# Patient Record
Sex: Male | Born: 1970
Health system: Southern US, Community
[De-identification: ages and names within clinical notes are randomized; demographics above are authoritative.]

## PROBLEM LIST (undated history)

## (undated) DIAGNOSIS — H3500 Unspecified background retinopathy: Secondary | ICD-10-CM

## (undated) DIAGNOSIS — E119 Type 2 diabetes mellitus without complications: Secondary | ICD-10-CM

## (undated) DIAGNOSIS — R809 Proteinuria, unspecified: Secondary | ICD-10-CM

## (undated) DIAGNOSIS — N183 Chronic kidney disease, stage 3 unspecified: Secondary | ICD-10-CM

## (undated) DIAGNOSIS — IMO0001 Reserved for inherently not codable concepts without codable children: Secondary | ICD-10-CM

## (undated) DIAGNOSIS — R3129 Other microscopic hematuria: Secondary | ICD-10-CM

## (undated) DIAGNOSIS — E786 Lipoprotein deficiency: Secondary | ICD-10-CM

## (undated) DIAGNOSIS — F329 Major depressive disorder, single episode, unspecified: Secondary | ICD-10-CM

## (undated) DIAGNOSIS — E039 Hypothyroidism, unspecified: Secondary | ICD-10-CM

## (undated) DIAGNOSIS — M1990 Unspecified osteoarthritis, unspecified site: Secondary | ICD-10-CM

## (undated) DIAGNOSIS — E1143 Type 2 diabetes mellitus with diabetic autonomic (poly)neuropathy: Secondary | ICD-10-CM

## (undated) DIAGNOSIS — R6510 Systemic inflammatory response syndrome (SIRS) of non-infectious origin without acute organ dysfunction: Secondary | ICD-10-CM

## (undated) DIAGNOSIS — J189 Pneumonia, unspecified organism: Secondary | ICD-10-CM

## (undated) DIAGNOSIS — E785 Hyperlipidemia, unspecified: Secondary | ICD-10-CM

## (undated) DIAGNOSIS — K3184 Gastroparesis: Secondary | ICD-10-CM

## (undated) DIAGNOSIS — F32A Depression, unspecified: Secondary | ICD-10-CM

## (undated) DIAGNOSIS — Z794 Long term (current) use of insulin: Secondary | ICD-10-CM

## (undated) DIAGNOSIS — K219 Gastro-esophageal reflux disease without esophagitis: Secondary | ICD-10-CM

## (undated) DIAGNOSIS — D509 Iron deficiency anemia, unspecified: Secondary | ICD-10-CM

## (undated) DIAGNOSIS — E875 Hyperkalemia: Secondary | ICD-10-CM

## (undated) DIAGNOSIS — Z21 Asymptomatic human immunodeficiency virus [HIV] infection status: Secondary | ICD-10-CM

## (undated) HISTORY — DX: Major depressive disorder, single episode, unspecified: F32.9

## (undated) HISTORY — DX: Type 2 diabetes mellitus with diabetic autonomic (poly)neuropathy: E11.43

## (undated) HISTORY — PX: EYE SURGERY: SHX253

## (undated) HISTORY — PX: COLONOSCOPY: SHX174

## (undated) HISTORY — PX: OTHER SURGICAL HISTORY: SHX169

## (undated) HISTORY — DX: Gastro-esophageal reflux disease without esophagitis: K21.9

## (undated) HISTORY — DX: Long term (current) use of insulin: Z79.4

## (undated) HISTORY — DX: Gastroparesis: K31.84

## (undated) HISTORY — DX: Depression, unspecified: F32.A

## (undated) HISTORY — DX: Unspecified background retinopathy: H35.00

## (undated) HISTORY — DX: Unspecified osteoarthritis, unspecified site: M19.90

## (undated) HISTORY — DX: Other microscopic hematuria: R31.29

## (undated) HISTORY — DX: Proteinuria, unspecified: R80.9

## (undated) HISTORY — PX: LASIK: SHX215

## (undated) HISTORY — DX: Hyperlipidemia, unspecified: E78.5

## (undated) HISTORY — DX: Iron deficiency anemia, unspecified: D50.9

## (undated) HISTORY — DX: Type 2 diabetes mellitus without complications: E11.9

## (undated) HISTORY — PX: ESOPHAGOGASTRODUODENOSCOPY: SHX1529

## (undated) HISTORY — PX: VITRECTOMY: SHX106

## (undated) HISTORY — DX: Reserved for inherently not codable concepts without codable children: IMO0001

## (undated) HISTORY — PX: CATARACT EXTRACTION: SUR2

---

## 1998-07-14 ENCOUNTER — Encounter: Admission: RE | Admit: 1998-07-14 | Discharge: 1998-10-12 | Payer: Self-pay | Admitting: Endocrinology

## 1999-11-14 ENCOUNTER — Ambulatory Visit (HOSPITAL_COMMUNITY): Admission: RE | Admit: 1999-11-14 | Discharge: 1999-11-14 | Payer: Self-pay | Admitting: Gastroenterology

## 1999-11-14 ENCOUNTER — Encounter (INDEPENDENT_AMBULATORY_CARE_PROVIDER_SITE_OTHER): Payer: Self-pay | Admitting: Specialist

## 2004-02-15 ENCOUNTER — Emergency Department (HOSPITAL_COMMUNITY): Admission: EM | Admit: 2004-02-15 | Discharge: 2004-02-15 | Payer: Self-pay | Admitting: Emergency Medicine

## 2004-03-12 ENCOUNTER — Encounter: Admission: RE | Admit: 2004-03-12 | Discharge: 2004-03-12 | Payer: Self-pay | Admitting: *Deleted

## 2004-06-25 ENCOUNTER — Ambulatory Visit (HOSPITAL_COMMUNITY): Admission: RE | Admit: 2004-06-25 | Discharge: 2004-06-25 | Payer: Self-pay | Admitting: Ophthalmology

## 2004-07-04 ENCOUNTER — Ambulatory Visit (HOSPITAL_COMMUNITY): Admission: RE | Admit: 2004-07-04 | Discharge: 2004-07-05 | Payer: Self-pay | Admitting: Ophthalmology

## 2005-05-16 ENCOUNTER — Ambulatory Visit: Payer: Self-pay | Admitting: Hematology and Oncology

## 2005-06-11 LAB — URINALYSIS, MICROSCOPIC - CHCC
Bilirubin (Urine): NEGATIVE
Ketones: NEGATIVE mg/dL
Protein: 30 mg/dL
Specific Gravity, Urine: 1.03 (ref 1.003–1.035)
pH: 5 (ref 4.6–8.0)

## 2005-06-11 LAB — CBC & DIFF AND RETIC
BASO%: 0.5 % (ref 0.0–2.0)
Eosinophils Absolute: 0.3 10*3/uL (ref 0.0–0.5)
IRF: 0.38 (ref 0.070–0.380)
LYMPH%: 37.5 % (ref 14.0–48.0)
MCHC: 33.5 g/dL (ref 32.0–35.9)
MONO#: 0.7 10*3/uL (ref 0.1–0.9)
NEUT#: 2.6 10*3/uL (ref 1.5–6.5)
RBC: 4.82 10*6/uL (ref 4.20–5.71)
RDW: 14.1 % (ref 11.2–14.6)
RETIC #: 87.2 10*3/uL (ref 31.8–103.9)
Retic %: 1.8 % (ref 0.7–2.3)
WBC: 5.7 10*3/uL (ref 4.0–10.0)
lymph#: 2.2 10*3/uL (ref 0.9–3.3)

## 2005-06-11 LAB — COMPREHENSIVE METABOLIC PANEL
ALT: 34 U/L (ref 0–40)
Alkaline Phosphatase: 115 U/L (ref 39–117)
CO2: 28 mEq/L (ref 19–32)
Creatinine, Ser: 1 mg/dL (ref 0.4–1.5)
Total Bilirubin: 0.6 mg/dL (ref 0.3–1.2)

## 2005-06-11 LAB — PSA: PSA: 0.36 ng/mL (ref 0.10–4.00)

## 2005-06-11 LAB — CHCC SMEAR

## 2005-06-14 LAB — IRON AND TIBC
%SAT: 17 % — ABNORMAL LOW (ref 20–55)
TIBC: 349 ug/dL (ref 215–435)

## 2005-06-14 LAB — FERRITIN: Ferritin: 19 ng/mL — ABNORMAL LOW (ref 22–322)

## 2005-06-14 LAB — IMMUNOFIXATION ELECTROPHORESIS
IgA: 203 mg/dL (ref 68–378)
Total Protein, Serum Electrophoresis: 8.8 g/dL — ABNORMAL HIGH (ref 6.0–8.3)

## 2005-06-14 LAB — VITAMIN B12: Vitamin B-12: 361 pg/mL (ref 211–911)

## 2005-06-14 LAB — HAPTOGLOBIN: Haptoglobin: 147 mg/dL (ref 16–200)

## 2005-07-12 ENCOUNTER — Ambulatory Visit: Payer: Self-pay | Admitting: Hematology and Oncology

## 2005-07-16 ENCOUNTER — Other Ambulatory Visit: Admission: RE | Admit: 2005-07-16 | Discharge: 2005-07-16 | Payer: Self-pay | Admitting: Hematology and Oncology

## 2005-07-16 ENCOUNTER — Encounter (INDEPENDENT_AMBULATORY_CARE_PROVIDER_SITE_OTHER): Payer: Self-pay | Admitting: Specialist

## 2005-07-16 LAB — CBC WITH DIFFERENTIAL/PLATELET
BASO%: 0.5 % (ref 0.0–2.0)
EOS%: 6.1 % (ref 0.0–7.0)
LYMPH%: 42.7 % (ref 14.0–48.0)
MCHC: 34.3 g/dL (ref 32.0–35.9)
MCV: 80 fL — ABNORMAL LOW (ref 81.6–98.0)
MONO%: 13.5 % — ABNORMAL HIGH (ref 0.0–13.0)
NEUT#: 2.8 10*3/uL (ref 1.5–6.5)
Platelets: 223 10*3/uL (ref 145–400)
RBC: 4.62 10*6/uL (ref 4.20–5.71)
RDW: 13.8 % (ref 11.2–14.6)

## 2007-11-05 DIAGNOSIS — R3129 Other microscopic hematuria: Secondary | ICD-10-CM

## 2007-11-05 HISTORY — DX: Other microscopic hematuria: R31.29

## 2009-07-04 ENCOUNTER — Encounter: Admission: RE | Admit: 2009-07-04 | Discharge: 2009-07-04 | Payer: Self-pay | Admitting: Nephrology

## 2010-02-04 LAB — HM MAMMOGRAPHY

## 2010-06-12 ENCOUNTER — Encounter: Payer: Self-pay | Admitting: Family Medicine

## 2010-06-13 ENCOUNTER — Encounter: Payer: Self-pay | Admitting: Family Medicine

## 2010-06-22 NOTE — Op Note (Signed)
NAMESEVERIN, Patrick Brown                ACCOUNT NO.:  [id number]   MEDICAL RECORD NO.:  [id number]          PATIENT TYPE:  OIB   LOCATION:  5727                         FACILITY:  [place]   PHYSICIAN:  Clent Demark. Rankin, M.D.   DATE OF BIRTH:  Jan 17, 1971   DATE OF PROCEDURE:  10/15/2004  DATE OF DISCHARGE:                                 OPERATIVE REPORT   PREOPERATIVE DIAGNOSES:  1.  Dense vitreous hemorrhage, left eye - recurrent.  2.  Hyphema, left eye.  3.  Secondary glaucoma, left eye, secondary to #2.  4.  Progressive proliferative diabetic retinopathy, left eye, status post      recent vitrectomy for tractional detachment and vitreous hemorrhage.   INDICATIONS FOR PROCEDURE:  The family understands that this is an attempt  to remove the anterior segment hyphema to minimize glaucoma as well as to  clear the vitreous opacificaitons and induce quiescence diabetic  retinopathy.  He understands the risks of anesthesia as well as rare  occurrence of death, loss of the eye, including but not limited to  hemorrhage, infection, scarring, need for further surgery, no change in  vision, loss of vision, progressive disease despite intervention.  Appropriate signed consent was obtained.   DESCRIPTION OF PROCEDURE:  The patient was taken to the operating room.  In  the operating room, appropriate monitors followed by mild sedation.  General  endotracheal anesthesia.  The left ocular region was sterilely prepped and  draped in the usual ophthalmic fashion.  A lid speculum was applied.   A _____ peritomy was then fashioned superiorly and inferotemporally.  A 4-mm  infusion was then secured.  Anterior chamber wash out was then carried out  with BSS irrigation.  The anterior chamber view cleared nicely.  No  recurrence occurred.  The vitreous hemorrhage was thus removed.  There was  neovascular frond noted in the inferotemporal quadrant.  This was cauterized  directly with laser photocoagulation.  No  other retinal holes or tears were  identified.  The anterior chamber and the peripheral vitreous were thus  clear of any recurrence or new hemorrhage.   At this time, the instruments were removed from the eye, and the superior  sclerotomy was closed with 7-0 Vicryl.  The infusion was similarly closed.  The conjunctiva was closed with 7-0 Vicryl.  Subconjunctival injection of  antibiotics were applied.   The patient was awakened from anesthesia without difficulty.  He was taken  to the recovery room.      Clent Demark Rankin, M.D.  Electronically Signed     GAR/MEDQ  D:  10/15/2004  T:  10/16/2004  Job:  [id number]

## 2010-06-22 NOTE — Procedures (Signed)
[place]  Patient:    Patrick Brown, Patrick Brown                       MRN: [id number] Proc. Date: 11/14/99 Adm. Date:  [id number] Attending:  Juanita Craver CC:         Ishmael Holter. Forde Dandy, M.D.   Procedure Report  DATE OF BIRTH:  [birth year].  REFERRING PHYSICIAN:  Ishmael Holter. Forde Dandy, M.D.  PROCEDURE PERFORMED:  Esophagogastroduodenoscopy with biopsies.  ENDOSCOPIST:  Nelwyn Salisbury, M.D.  INSTRUMENT USED:  Olympus video panendoscope.  INDICATIONS FOR PROCEDURE:  A 40 year old white male with a history of epigastric pain and normal abdominal ultrasound.  Rule out peptic ulcer disease, esophagitis, gastritis, etc.  The patient has been on Prevacid which has not helped her symptoms.  PREPROCEDURE PREPARATION:  Informed consent was procured from the patient. The patient was fasted for eight hours prior to the procedure.  PREPROCEDURE PHYSICAL:  The patient had stable vital signs.  Neck supple. Chest clear to auscultation.  S1, S2 regular.  Abdomen soft with epigastric and right upper quadrant tenderness on palpation, no guarding, no rebound, no rigidity, no hepatosplenomegaly.  DESCRIPTION OF PROCEDURE:  The patient was placed in left lateral decubitus position and sedated with 40 mg of Demerol and 4 mg of Versed intravenously. Once the patient was adequately sedated and maintained on low-flow oxygen and continuous cardiac monitoring, the Olympus video panendoscope was advanced through the mouthpiece, over the tongue, into the esophagus under direct vision.  The entire esophagus appeared normal without evidence of ring, stricture, mass, lesions or esophagitis or Barretts mucosa.  The scope was then advanced through the stomach.  A small hiatal hernia was seen on high retroflexion.  There were antral erosions with erythema and friability of the mucosa.  Biopsies were done from the antrum to rule out presence of Helicobacter pylori by pathology.  The  duodenal bulb and the small bowel distal to the bulb up to 60 cm appeared normal.  There was no outlet obstruction.  The patient tolerated the procedure well without complications.  IMPRESSION: 1. Normal-appearing esophagus and small bowel. 2. Small hiatal hernia, antral erosions with bleeding and friability of the    mucosa.  Biopsies done for Helicobacter pylori.  RECOMMENDATION: 1. Continue PPI for now. 2. Treat with antibiotics if Helicobacter pylori present. 3. Avoid all nonsteroidals. 4. Outpatient follow-up in the next two weeks. DD:  11/14/99 TD:  11/15/99 Job: [id number]

## 2010-06-22 NOTE — Op Note (Signed)
NAMEBERVIN, KRONINGER                ACCOUNT NO.:  [id number]   MEDICAL RECORD NO.:  [id number]          PATIENT TYPE:  OIB   LOCATION:  2852                         FACILITY:  [place]   PHYSICIAN:  Clent Demark. Rankin, M.D.   DATE OF BIRTH:  1970/08/17   DATE OF PROCEDURE:  06/25/2004  DATE OF DISCHARGE:  06/25/2004                                 OPERATIVE REPORT   PREOPERATIVE DIAGNOSES:  1.  Dense vitreous hemorrhage of the left eye.  2.  Tractional detachment left eye.  3.  Progressive proliferative diabetic retinopathy left eye.   POSTOPERATIVE DIAGNOSES:  1.  Dense vitreous hemorrhage of the left eye.  2.  Tractional detachment left eye.  3.  Progressive proliferative diabetic retinopathy left eye.   PROCEDURES:  1.  Posterior vitrectomy with membrane peel left eye.  2.  Endolaser panretinal photocoagulation left eye.   SURGEON:  Clent Demark. Rankin, M.D.   ANESTHESIA:  General endotracheal anesthesia.   INDICATION FOR PROCEDURE:  This is a 40 year old man who has profound vision  loss, nonclearing vitreous hemorrhage left eye that has affected his  activities of daily living as well as ability to work.  The patient  understands this is an attempt to clear the vitreous opacification as well  as to allow best visual function, as well as to reduce quiescence of  diabetic retinopathy.  He understands the risks of anesthesia including the  rare occurrence of death, loss of the eye including but not limited to  further surgery on the underlying disease, hemorrhage, infection, scarring,  need for another surgery, no change in vision, loss of vision, and  progressive disease despite intervention.  Appropriate signed consent was  obtained, and he was taken to the operating room.   In the operating room, general endotracheal anesthesia was instituted as per  his request.  The left periocular region was sterilely prepped and draped in  the usual sterile fashion.  Lid speculum applied.  A  25-gauge trocar system  was used to complete the procedure.  The inferotemporal trocar was placed  into the vitreous cavity.  The infusion was secured and visualized in the  vitreous cavity.  Also, with microscopic guidance, superior trocars were  placed.  Core vitrectomy was then begun.  Dense preretinal vitreous  hemorrhage was removed as well as removing the posterior hyaloid.  Tractional detachment was found along this superotemporal arcade which was  also the site of hemorrhage.  The attachments to the retina were transected  and endolaser photocoagulation placed to cauterize the bleeding blood  vessel.  Preretinal hemorrhage was aspirated.  Remaining areas of  vitreoretinal detachments were noted also at the 11 o'clock position in the  midperiphery.  These areas of traction were also released.  The peripheral  vitreous skirt was then trimmed 360 degrees.  Preretinal hemorrhage was  removed entirely.  Endolaser photocoagulation was placed 360 degrees using  directional laser probe.  At this time, the instruments were removed from  the eye.  Hemostasis was spontaneous.  Under low infusion pressure, the trocar system  was removed.  Subconjunctival  injection of steroid placed inferonasally.  Sterile patch and Fox shield applied.  Intraocular pressure was found to be  adequate.  The patient was awakened from anesthesia and taken to the  recovery room in good and stable condition.       GAR/MEDQ  D:  06/25/2004  T:  06/26/2004  Job:  [id number]

## 2010-12-31 ENCOUNTER — Encounter (HOSPITAL_COMMUNITY): Payer: Self-pay

## 2010-12-31 ENCOUNTER — Other Ambulatory Visit: Payer: Self-pay

## 2010-12-31 ENCOUNTER — Inpatient Hospital Stay (HOSPITAL_COMMUNITY)
Admission: EM | Admit: 2010-12-31 | Discharge: 2011-01-02 | DRG: 296 | Disposition: A | Discharge status: Home / Self Care | Payer: BC Managed Care – PPO | Attending: Internal Medicine | Admitting: Internal Medicine

## 2010-12-31 DIAGNOSIS — Z9641 Presence of insulin pump (external) (internal): Secondary | ICD-10-CM

## 2010-12-31 DIAGNOSIS — D649 Anemia, unspecified: Secondary | ICD-10-CM | POA: Diagnosis present

## 2010-12-31 DIAGNOSIS — N183 Chronic kidney disease, stage 3 unspecified: Secondary | ICD-10-CM | POA: Diagnosis present

## 2010-12-31 DIAGNOSIS — E875 Hyperkalemia: Principal | ICD-10-CM | POA: Diagnosis present

## 2010-12-31 DIAGNOSIS — E1029 Type 1 diabetes mellitus with other diabetic kidney complication: Secondary | ICD-10-CM | POA: Diagnosis present

## 2010-12-31 DIAGNOSIS — E785 Hyperlipidemia, unspecified: Secondary | ICD-10-CM | POA: Diagnosis present

## 2010-12-31 DIAGNOSIS — E871 Hypo-osmolality and hyponatremia: Secondary | ICD-10-CM | POA: Diagnosis present

## 2010-12-31 DIAGNOSIS — Z794 Long term (current) use of insulin: Secondary | ICD-10-CM

## 2010-12-31 DIAGNOSIS — R739 Hyperglycemia, unspecified: Secondary | ICD-10-CM

## 2010-12-31 DIAGNOSIS — E039 Hypothyroidism, unspecified: Secondary | ICD-10-CM | POA: Diagnosis present

## 2010-12-31 HISTORY — DX: Hypothyroidism, unspecified: E03.9

## 2010-12-31 LAB — BASIC METABOLIC PANEL
CO2: 18 mEq/L — ABNORMAL LOW (ref 19–32)
Calcium: 8.9 mg/dL (ref 8.4–10.5)
Chloride: 104 mEq/L (ref 96–112)
Glucose, Bld: 398 mg/dL — ABNORMAL HIGH (ref 70–99)
Potassium: 6.1 mEq/L — ABNORMAL HIGH (ref 3.5–5.1)
Sodium: 129 mEq/L — ABNORMAL LOW (ref 135–145)

## 2010-12-31 LAB — BLOOD GAS, ARTERIAL
Bicarbonate: 18.4 mEq/L — ABNORMAL LOW (ref 20.0–24.0)
Drawn by: 232811
FIO2: 0.21 %
O2 Saturation: 97.3 %
Patient temperature: 98.6
pH, Arterial: 7.354 (ref 7.350–7.450)
pO2, Arterial: 92.6 mmHg (ref 80.0–100.0)

## 2010-12-31 LAB — CARDIAC PANEL(CRET KIN+CKTOT+MB+TROPI)
CK, MB: 16.5 ng/mL (ref 0.3–4.0)
Relative Index: 7.7 — ABNORMAL HIGH (ref 0.0–2.5)
Troponin I: <0.3 ng/mL (ref <0.30)

## 2010-12-31 LAB — URINALYSIS, ROUTINE W REFLEX MICROSCOPIC
Bilirubin Urine: NEGATIVE
Ketones, ur: NEGATIVE mg/dL
Nitrite: NEGATIVE
Protein, ur: NEGATIVE mg/dL
Specific Gravity, Urine: 1.008 (ref 1.005–1.030)
Urobilinogen, UA: 0.2 mg/dL (ref 0.0–1.0)

## 2010-12-31 LAB — GLUCOSE, CAPILLARY

## 2010-12-31 LAB — CBC
Hemoglobin: 10.2 g/dL — ABNORMAL LOW (ref 13.0–17.0)
MCH: 28.3 pg (ref 26.0–34.0)
RBC: 3.61 MIL/uL — ABNORMAL LOW (ref 4.22–5.81)
WBC: 4.6 10*3/uL (ref 4.0–10.5)

## 2010-12-31 LAB — URINE MICROSCOPIC-ADD ON

## 2010-12-31 MED ORDER — SODIUM CHLORIDE 0.9 % IV SOLN: INTRAVENOUS | Status: DC

## 2010-12-31 MED ORDER — SODIUM POLYSTYRENE SULFONATE 15 GM/60ML PO SUSP
30.0000 g | Freq: Once | ORAL | Status: AC
  Administered 2011-01-01: 30 g via ORAL
  Filled 2010-12-31: qty 120

## 2010-12-31 MED ORDER — SODIUM POLYSTYRENE SULFONATE 15 GM/60ML PO SUSP
30.0000 g | Freq: Once | ORAL | Status: AC
  Administered 2010-12-31: 30 g via ORAL
  Filled 2010-12-31: qty 120

## 2010-12-31 MED ORDER — INSULIN GLARGINE 100 UNIT/ML ~~LOC~~ SOLN
10.0000 [IU] | Freq: Every day | SUBCUTANEOUS | Status: DC
  Filled 2010-12-31: qty 3

## 2010-12-31 MED ORDER — SODIUM CHLORIDE 0.9 % IJ SOLN: 3.0000 mL | INTRAMUSCULAR | Status: DC | PRN

## 2010-12-31 MED ORDER — ONDANSETRON HCL 4 MG/2ML IJ SOLN: 4.0000 mg | Freq: Four times a day (QID) | INTRAMUSCULAR | Status: DC | PRN

## 2010-12-31 MED ORDER — ENOXAPARIN SODIUM 40 MG/0.4ML ~~LOC~~ SOLN
40.0000 mg | Freq: Every day | SUBCUTANEOUS | Status: DC
  Administered 2011-01-01: 40 mg via SUBCUTANEOUS
  Filled 2010-12-31 (×2): qty 0.4

## 2010-12-31 MED ORDER — LEVOTHYROXINE SODIUM 200 MCG PO TABS
200.0000 ug | ORAL_TABLET | Freq: Every day | ORAL | Status: DC
  Administered 2011-01-01 – 2011-01-02 (×2): 200 ug via ORAL
  Filled 2010-12-31 (×2): qty 1

## 2010-12-31 MED ORDER — OMEGA-3-ACID ETHYL ESTERS 1 G PO CAPS
2.0000 g | ORAL_CAPSULE | Freq: Two times a day (BID) | ORAL | Status: DC
  Administered 2011-01-01 – 2011-01-02 (×4): 2 g via ORAL
  Filled 2010-12-31 (×5): qty 2

## 2010-12-31 MED ORDER — INSULIN ASPART 100 UNIT/ML ~~LOC~~ SOLN: 0.0000 [IU] | Freq: Every day | SUBCUTANEOUS | Status: DC

## 2010-12-31 MED ORDER — FUROSEMIDE 10 MG/ML IJ SOLN
80.0000 mg | Freq: Once | INTRAMUSCULAR | Status: AC
  Administered 2010-12-31: 80 mg via INTRAVENOUS
  Filled 2010-12-31: qty 8

## 2010-12-31 MED ORDER — NIACIN ER (ANTIHYPERLIPIDEMIC) 750 MG PO TBCR: 750.0000 mg | EXTENDED_RELEASE_TABLET | Freq: Every day | ORAL | Status: DC

## 2010-12-31 MED ORDER — INSULIN ASPART 100 UNIT/ML ~~LOC~~ SOLN
10.0000 [IU] | Freq: Once | SUBCUTANEOUS | Status: AC
  Administered 2010-12-31: 18:00:00 via SUBCUTANEOUS
  Filled 2010-12-31: qty 1

## 2010-12-31 MED ORDER — TESTOSTERONE 50 MG/5GM (1%) TD GEL
5.0000 g | Freq: Every day | TRANSDERMAL | Status: DC
  Administered 2011-01-01: 5 g via TRANSDERMAL
  Filled 2010-12-31 (×3): qty 5

## 2010-12-31 MED ORDER — SODIUM CHLORIDE 0.9 % IV BOLUS (SEPSIS)
250.0000 mL | Freq: Once | INTRAVENOUS | Status: AC
  Administered 2010-12-31: 250 mL via INTRAVENOUS

## 2010-12-31 MED ORDER — ALBUTEROL SULFATE (5 MG/ML) 0.5% IN NEBU: 2.5000 mg | INHALATION_SOLUTION | RESPIRATORY_TRACT | Status: DC | PRN

## 2010-12-31 MED ORDER — ACETAMINOPHEN 325 MG PO TABS: 650.0000 mg | ORAL_TABLET | Freq: Four times a day (QID) | ORAL | Status: DC | PRN

## 2010-12-31 MED ORDER — INSULIN ASPART 100 UNIT/ML ~~LOC~~ SOLN: 3.0000 [IU] | Freq: Three times a day (TID) | SUBCUTANEOUS | Status: DC

## 2010-12-31 MED ORDER — PANTOPRAZOLE SODIUM 40 MG PO TBEC
40.0000 mg | DELAYED_RELEASE_TABLET | Freq: Every day | ORAL | Status: DC
  Administered 2011-01-01: 40 mg via ORAL
  Filled 2010-12-31 (×3): qty 1

## 2010-12-31 MED ORDER — INSULIN ASPART 100 UNIT/ML ~~LOC~~ SOLN
0.0000 [IU] | Freq: Three times a day (TID) | SUBCUTANEOUS | Status: DC
  Filled 2010-12-31: qty 3

## 2010-12-31 MED ORDER — ZOLPIDEM TARTRATE 10 MG PO TABS: 10.0000 mg | ORAL_TABLET | Freq: Every evening | ORAL | Status: DC | PRN

## 2010-12-31 MED ORDER — ASPIRIN 81 MG PO CHEW
81.0000 mg | CHEWABLE_TABLET | Freq: Every day | ORAL | Status: DC
  Administered 2011-01-01 – 2011-01-02 (×2): 81 mg via ORAL
  Filled 2010-12-31 (×2): qty 1

## 2010-12-31 MED ORDER — ACETAMINOPHEN 650 MG RE SUPP: 650.0000 mg | Freq: Four times a day (QID) | RECTAL | Status: DC | PRN

## 2010-12-31 MED ORDER — ONDANSETRON HCL 4 MG PO TABS: 4.0000 mg | ORAL_TABLET | Freq: Four times a day (QID) | ORAL | Status: DC | PRN

## 2010-12-31 MED ORDER — SODIUM CHLORIDE 0.9 % IV SOLN: 250.0000 mL | INTRAVENOUS | Status: DC | PRN

## 2010-12-31 MED ORDER — SIMVASTATIN 40 MG PO TABS
40.0000 mg | ORAL_TABLET | Freq: Every day | ORAL | Status: DC
  Administered 2011-01-01: 40 mg via ORAL
  Filled 2010-12-31 (×2): qty 1

## 2010-12-31 MED ORDER — SODIUM CHLORIDE 0.9 % IJ SOLN
3.0000 mL | Freq: Two times a day (BID) | INTRAMUSCULAR | Status: DC
  Administered 2011-01-01 (×2): 3 mL via INTRAVENOUS

## 2010-12-31 MED ORDER — INSULIN REGULAR HUMAN 100 UNIT/ML IJ SOLN: 10.0000 [IU] | Freq: Once | INTRAMUSCULAR | Status: DC

## 2010-12-31 MED ORDER — FERROUS SULFATE 325 (65 FE) MG PO TABS
325.0000 mg | ORAL_TABLET | Freq: Two times a day (BID) | ORAL | Status: DC
  Administered 2011-01-01 – 2011-01-02 (×4): 325 mg via ORAL
  Filled 2010-12-31 (×5): qty 1

## 2010-12-31 MED ORDER — ENOXAPARIN SODIUM 30 MG/0.3ML ~~LOC~~ SOLN: 30.0000 mg | SUBCUTANEOUS | Status: DC

## 2010-12-31 MED ORDER — NIACIN ER 500 MG PO CPCR
750.0000 mg | ORAL_CAPSULE | Freq: Every day | ORAL | Status: DC
  Administered 2011-01-01: 750 mg via ORAL
  Filled 2010-12-31 (×2): qty 1

## 2010-12-31 MED ORDER — SODIUM CHLORIDE 0.9 % IV SOLN
INTRAVENOUS | Status: DC
  Administered 2010-12-31: 16:00:00 via INTRAVENOUS

## 2010-12-31 MED ORDER — VENLAFAXINE HCL 50 MG PO TABS
50.0000 mg | ORAL_TABLET | Freq: Two times a day (BID) | ORAL | Status: DC
  Administered 2011-01-01 – 2011-01-02 (×4): 50 mg via ORAL
  Filled 2010-12-31 (×5): qty 1

## 2010-12-31 NOTE — ED Notes (Signed)
Patient ambulated to bathroom; gait steady. No complaints voiced at this time. Awaiting bed assignment.

## 2010-12-31 NOTE — ED Notes (Signed)
Hypokalemia,....weakness ,

## 2010-12-31 NOTE — H&P (Addendum)
Patrick Brown MRN: [id number] DOB/AGE: [birth year] 40 y.o. Primary Care Physician:No primary provider on file. Admit date: 12/31/2010  PCP Dr. Morrie Sheldon in [facility]  Chief Complaint: hyperkalemia  HPI:  Is a 40 year old male who comes to the ER, therefore to by his PCP for a potassium of 6.5. He was administered Lasix and kayexalate  in the ER The patient states he has not felt well since July of this year, when he was admitted and [facility] hospital, for weight loss diarrhea. His potassium was high, even in July. It was as high as 7.5. At that time he was on Micardis. The patient has experienced some shortness of breath, denies any fever chills rigers or cough. Denies any orthopnea paroxysmal nocturnal dyspnea or dependent edema. He denies any nausea vomiting abdominal pain. He states that he has had a low hemoglobin, for several years and was evaluated by Dr. Kennith Gain, and was told that his anemia is because of his chronic kidney disease.   Past Medical History  Diagnosis Date  . IDDM (insulin dependent diabetes mellitus)   . Retinopathy     x2  . Proteinuria   . Dyslipidemia   . Anemia, iron deficiency   . Hematuria, microscopic 10/09    work up negative (Dr. Amalia Hailey)  . Diabetes mellitus   . Hypothyroidism     Past Surgical History  Procedure Date  . Eye surgery 2006    x2     Prior to Admission medications   Medication Sig Start Date End Date Taking? Authorizing Provider  aspirin (ASPIRIN CHILDRENS) 81 MG chewable tablet Chew 81 mg by mouth daily.    Yes Historical Provider, MD  dexlansoprazole (DEXILANT) 60 MG capsule Take 60 mg by mouth daily.     Yes Historical Provider, MD  ferrous sulfate (KP FERROUS SULFATE) 325 (65 FE) MG tablet Take 325 mg by mouth 2 (two) times daily.     Yes Historical Provider, MD  insulin lispro (HUMALOG) 100 UNIT/ML injection by Per Tube route continuous. Pt.  Uses insulin via insulin pump .    Yes Historical Provider, MD  levothyroxine  (SYNTHROID, LEVOTHROID) 200 MCG tablet Take 200 mcg by mouth.     Yes Historical Provider, MD  meloxicam (MOBIC) 15 MG tablet Take 15 mg by mouth as needed.     Yes Historical Provider, MD  niacin (NIASPAN) 750 MG CR tablet Take 750 mg by mouth at bedtime.     Yes Historical Provider, MD  omega-3 acid ethyl esters (LOVAZA) 1 G capsule Take 2 g by mouth 2 (two) times daily.     Yes Historical Provider, MD  simvastatin (ZOCOR) 40 MG tablet Take 40 mg by mouth at bedtime.     Yes Historical Provider, MD  testosterone (ANDROGEL) 50 MG/5GM GEL Place 5 g onto the skin daily.     Yes Historical Provider, MD  venlafaxine (EFFEXOR) 50 MG tablet Take 50 mg by mouth 2 (two) times daily.     Yes Historical Provider, MD  Blood Glucose Monitoring Suppl (B-D LOGIC BLOOD GLUCOSE) W/DEVICE KIT by Does not apply route.      Historical Provider, MD  glucose blood test strip 1 each by Other route as needed. Use as instructed     Historical Provider, MD  Insulin Admin Supplies (INSULIN PUMP RECORD BOOK) MISC by Does not apply route.      Historical Provider, MD  Insulin Infusion Pump DEVI by Does not apply route.  Historical Provider, MD  Insulin Infusion Pump KIT by Does not apply route.      Historical Provider, MD  Insulin Infusion Pump Supplies (INSULIN PUMP SYRINGE RESERVOIR) MISC by Does not apply route.      Historical Provider, MD  Insulin Infusion Pump Supplies MISC by Does not apply route.      Historical Provider, MD  Lancets MISC by Does not apply route.      Historical Provider, MD  zolpidem (AMBIEN) 10 MG tablet Take 10 mg by mouth at bedtime as needed. For sleep    Historical Provider, MD    Allergies:  Allergies  Allergen Reactions  . Altace Cough    Family History  Problem Relation Age of Onset  . Prostate cancer      family history     Social History:  reports that he has never smoked. He has never used smokeless tobacco. He reports that he drinks alcohol. He reports that he does not  use illicit drugs.         [id number] 14 point review of systems was done, as documented in history of present illness  PHYSICAL EXAM: Blood pressure 119/71, pulse 93, temperature 98.3 F (36.8 C), temperature source Oral, resp. rate 16, SpO2 99.00%.  No results found for this or any previous visit (from the past 240 hour(s)).  Nursing note and vitals reviewed.  Constitutional: He is oriented to person, place, and time. He appears well-developed and well-nourished.  HENT:  Head: Normocephalic and atraumatic.  Mouth/Throat: Oropharynx is clear and moist.  Eyes: Conjunctivae and EOM are normal. Pupils are equal, round, and reactive to light.  Neck: Normal range of motion. Neck supple.  Cardiovascular: Normal rate, regular rhythm, normal heart sounds and intact distal pulses.  No murmur heard.  Pulmonary/Chest: Effort normal. He exhibits no tenderness.  Abdominal: Soft. Bowel sounds are normal. There is no tenderness.  Musculoskeletal: Normal range of motion. He exhibits no edema.  Lymphadenopathy:  He has no cervical adenopathy.  Neurological: He is alert and oriented to person, place, and time. No cranial nerve deficit. He exhibits normal muscle tone. Coordination normal.  Skin: Skin is warm and dry. No rash noted. No erythema.   Results for orders placed during the hospital encounter of 12/31/10 (from the past 48 hour(s))  CBC     Status: Abnormal   Collection Time   12/31/10  3:56 PM      Component Value Range Comment   WBC 4.6  4.0 - 10.5 (K/uL)    RBC 3.61 (*) 4.22 - 5.81 (MIL/uL)    Hemoglobin 10.2 (*) 13.0 - 17.0 (g/dL)    HCT 30.6 (*) 39.0 - 52.0 (%)    MCV 84.8  78.0 - 100.0 (fL)    MCH 28.3  26.0 - 34.0 (pg)    MCHC 33.3  30.0 - 36.0 (g/dL)    RDW 14.1  11.5 - 15.5 (%)    Platelets 120 (*) 150 - 400 (K/uL)   BASIC METABOLIC PANEL     Status: Abnormal   Collection Time   12/31/10  3:56 PM      Component Value Range Comment   Sodium 129 (*) 135 - 145 (mEq/L)     Potassium 6.1 (*) 3.5 - 5.1 (mEq/L)    Chloride 104  96 - 112 (mEq/L)    CO2 18 (*) 19 - 32 (mEq/L)    Glucose, Bld 398 (*) 70 - 99 (mg/dL)    BUN 35 (*) 6 -  23 (mg/dL)    Creatinine, Ser 1.69 (*) 0.50 - 1.35 (mg/dL)    Calcium 8.9  8.4 - 10.5 (mg/dL)    GFR calc non Af Amer 49 (*) >90 (mL/min)    GFR calc Af Amer 56 (*) >90 (mL/min)   GLUCOSE, CAPILLARY     Status: Abnormal   Collection Time   12/31/10  6:15 PM      Component Value Range Comment   Glucose-Capillary 282 (*) 70 - 99 (mg/dL)    Comment 1 Documented in Chart      Comment 2 Notify RN     BLOOD GAS, ARTERIAL     Status: Abnormal   Collection Time   12/31/10  7:45 PM      Component Value Range Comment   FIO2 0.21      Delivery systems ROOM AIR      pH, Arterial 7.354  7.350 - 7.450     pCO2 arterial 33.9 (*) 35.0 - 45.0 (mmHg)    pO2, Arterial 92.6  80.0 - 100.0 (mmHg)    Bicarbonate 18.4 (*) 20.0 - 24.0 (mEq/L)    TCO2 17.2  0 - 100 (mmol/L)    Acid-base deficit 5.9 (*) 0.0 - 2.0 (mmol/L)    O2 Saturation 97.3      Patient temperature 98.6      Collection site RIGHT RADIAL      Drawn by [id number]      Sample type ARTERIAL      Allens test (pass/fail) PASS  PASS      No results found.  Impression:   #1 hyperkalemia and was given IV Lasix and kayexalate in the ER, we will monitor serial BMP, administer another dose of kayexalate , avoid ACE inhibitors ARB's and NSAIDs.  #2 Anemia he states that his baseline hemoglobin is around 10, he denies any evidence of history of GI bleeding.   #3 Chronic kidney disease, stage 3, mod decreased GFR, he states that his baseline creatinine is about 1.5.  #4 Diabetes mellitus we will turn off his insulin pump and use Lantus and sliding scale insulin.  #5 hyponatremia likely pseudohyponatremia secondary to elevated CBGs, and expected to improve tomorrow.   #6 shortness of breath apparently had a normal chest x-ray, will check a stat d-dimer is elevated we'll do a vq scan to  rule out a PE     [facility] 12/31/2010, 9:17 PM

## 2010-12-31 NOTE — ED Notes (Signed)
Allyson Sabal, MD at bedside.

## 2010-12-31 NOTE — ED Notes (Signed)
Pt reports getting results from his PCP today with elevated K level and low hgb levels.  Reports becoming short of breath while in the shower this morning

## 2010-12-31 NOTE — ED Notes (Signed)
Attempt to call report to 4E unsuccessful; receiving RN will return call to ED when available for report.

## 2010-12-31 NOTE — ED Notes (Signed)
Attempt to call report unsuccessful.

## 2010-12-31 NOTE — ED Notes (Signed)
K+ 7.5 in July.

## 2010-12-31 NOTE — ED Provider Notes (Signed)
History     CSN: [id number] Arrival date & time: 12/31/2010  1:38 PM   First MD Initiated Contact with Patient 12/31/10 1510      Chief Complaint  Patient presents with  . Abnormal Lab    (Consider location/radiation/quality/duration/timing/severity/associated sxs/prior treatment) The history is provided by the patient.   patient is a 40 year old male sent from his primary care doctor's office for elevated potassium of 6.5. Patient has a history of borderline renal function has had elevated potassium in the past requiring admission treated with IV Lasix and Kayexalate. Patient also has a history of diabetes. No other complaints other than mild shortness of breath today and the patient had a chest x-ray and is doctor's office was normal. No further complaint of shortness of breath. Patient denies headache fever chills cough chest pain abdominal pain nausea vomiting diarrhea leg swelling rash or dysuria. Patient is currently a symptomatic.  Past Medical History  Diagnosis Date  . IDDM (insulin dependent diabetes mellitus)   . Retinopathy     x2  . Proteinuria   . Dyslipidemia   . Anemia, iron deficiency   . Hematuria, microscopic 10/09    work up negative (Dr. Amalia Hailey)  . Diabetes mellitus     Past Surgical History  Procedure Date  . Eye surgery 2006    x2     Family History  Problem Relation Age of Onset  . Prostate cancer      family history     History  Substance Use Topics  . Smoking status: Not on file  . Smokeless tobacco: Not on file  . Alcohol Use:       Review of Systems  Constitutional: Negative for fever and chills.  HENT: Negative for congestion and neck pain.   Eyes: Negative for redness and visual disturbance.  Respiratory: Positive for shortness of breath. Negative for cough and wheezing.   Cardiovascular: Negative for chest pain, palpitations and leg swelling.  Gastrointestinal: Negative for nausea, vomiting, abdominal pain and diarrhea.    Genitourinary: Negative for dysuria and hematuria.  Musculoskeletal: Negative for back pain.  Neurological: Negative for weakness and headaches.  Hematological: Negative for adenopathy.  Psychiatric/Behavioral: Negative for confusion.    Allergies  Altace  Home Medications   Current Outpatient Rx  Name Route Sig Dispense Refill  . ASPIRIN 81 MG PO CHEW Oral Chew 81 mg by mouth daily.     . DEXLANSOPRAZOLE 60 MG PO CPDR Oral Take 60 mg by mouth daily.      Marland Kitchen FERROUS SULFATE 325 (65 FE) MG PO TABS Oral Take 325 mg by mouth 2 (two) times daily.      . INSULIN LISPRO (HUMAN) 100 UNIT/ML [place] SOLN Per Tube by Per Tube route continuous. Pt.  Uses insulin via insulin pump .     Marland Kitchen LEVOTHYROXINE SODIUM 200 MCG PO TABS Oral Take 200 mcg by mouth.      . MELOXICAM 15 MG PO TABS Oral Take 15 mg by mouth as needed.      Marland Kitchen NIACIN (ANTIHYPERLIPIDEMIC) 750 MG PO TBCR Oral Take 750 mg by mouth at bedtime.      . OMEGA-3-ACID ETHYL ESTERS 1 G PO CAPS Oral Take 2 g by mouth 2 (two) times daily.      Marland Kitchen SIMVASTATIN 40 MG PO TABS Oral Take 40 mg by mouth at bedtime.      . TESTOSTERONE 50 MG/5GM TD GEL Transdermal Place 5 g onto the skin daily.      Marland Kitchen  VENLAFAXINE HCL 50 MG PO TABS Oral Take 50 mg by mouth 2 (two) times daily.      . BD LOGIC BLOOD GLUCOSE MONITOR W/DEVICE KIT Does not apply by Does not apply route.      Marland Kitchen GLUCOSE BLOOD VI STRP Other 1 each by Other route as needed. Use as instructed     . INSULIN PUMP RECORD BOOK MISC Does not apply by Does not apply route.      . INSULIN INFUSION PUMP DEVI Does not apply by Does not apply route.      . INSULIN INFUSION PUMP KIT Does not apply by Does not apply route.      . INSULIN PUMP SYRINGE RESERVOIR MISC Does not apply by Does not apply route.      . INSULIN INFUSION PUMP SUPPLIES MISC Does not apply by Does not apply route.      Marland Kitchen LANCETS MISC Does not apply by Does not apply route.      Marland Kitchen ZOLPIDEM TARTRATE 10 MG PO TABS Oral Take 10 mg by mouth at  bedtime as needed. For sleep      BP 128/75  Pulse 112  Temp(Src) 98.1 F (36.7 C) (Oral)  Resp 16  SpO2 99%  Physical Exam  Nursing note and vitals reviewed. Constitutional: He is oriented to person, place, and time. He appears well-developed and well-nourished.  HENT:  Head: Normocephalic and atraumatic.  Mouth/Throat: Oropharynx is clear and moist.  Eyes: Conjunctivae and EOM are normal. Pupils are equal, round, and reactive to light.  Neck: Normal range of motion. Neck supple.  Cardiovascular: Normal rate, regular rhythm, normal heart sounds and intact distal pulses.   No murmur heard. Pulmonary/Chest: Effort normal. He exhibits no tenderness.  Abdominal: Soft. Bowel sounds are normal. There is no tenderness.  Musculoskeletal: Normal range of motion. He exhibits no edema.  Lymphadenopathy:    He has no cervical adenopathy.  Neurological: He is alert and oriented to person, place, and time. No cranial nerve deficit. He exhibits normal muscle tone. Coordination normal.  Skin: Skin is warm and dry. No rash noted. No erythema.    ED Course  Procedures (including critical care time)  Labs Reviewed  CBC - Abnormal; Notable for the following:    RBC 3.61 (*)    Hemoglobin 10.2 (*)    HCT 30.6 (*)    Platelets 120 (*)    All other components within normal limits  BASIC METABOLIC PANEL - Abnormal; Notable for the following:    Sodium 129 (*)    Potassium 6.1 (*)    CO2 18 (*)    Glucose, Bld 398 (*)    BUN 35 (*)    Creatinine, Ser 1.69 (*)    GFR calc non Af Amer 49 (*)    GFR calc Af Amer 56 (*)    All other components within normal limits  GLUCOSE, CAPILLARY - Abnormal; Notable for the following:    Glucose-Capillary 282 (*)    All other components within normal limits   No results found.   Date: 12/31/2010  Rate: 96  Rhythm: normal sinus rhythm  QRS Axis: right  Intervals: normal  ST/T Wave abnormalities: normal  Conduction Disutrbances:none  Narrative  Interpretation:   Old EKG Reviewed: none available  CRITICAL CARE Performed by: Mervin Kung.   Total critical care time: 30  Critical care time was exclusive of separately billable procedures and treating other patients.  Critical care was necessary to treat or prevent  imminent or life-threatening deterioration.  Critical care was time spent personally by me on the following activities: development of treatment plan with patient and/or surrogate as well as nursing, discussions with consultants, evaluation of patient's response to treatment, examination of patient, obtaining history from patient or surrogate, ordering and performing treatments and interventions, ordering and review of laboratory studies, ordering and review of radiographic studies, pulse oximetry and re-evaluation of patient's condition.   1. Hyperkalemia   2. Hyperglycemia       MDM  Patient sent from his primary care doctor for elevated potassium. Potassium in the office today was 6.5. And RED potassium came in at 6.1 and blood sugar came in at 398, with borderline acidosis. Patient will require admission for the hyperkalemia. In the emergency department the patient was treated with normal saline fluid, IV Lasix 80 mg, and 30 g of Kayexalate, for the elevated K. from the elevated blood sugar patient received 10 units of regular insulin IV push. Patient had cardiac monitoring. Case discussed with admitting hospitalist Dr. Eugenio Hoes patient will go to team 7 telemetry admission.         Mervin Kung, MD 12/31/10 [phone]

## 2011-01-01 ENCOUNTER — Other Ambulatory Visit: Payer: Self-pay

## 2011-01-01 LAB — URINALYSIS, ROUTINE W REFLEX MICROSCOPIC
Bilirubin Urine: NEGATIVE
Glucose, UA: >1000 mg/dL — AB
Ketones, ur: NEGATIVE mg/dL
Protein, ur: NEGATIVE mg/dL

## 2011-01-01 LAB — COMPREHENSIVE METABOLIC PANEL
ALT: 29 U/L (ref 0–53)
Alkaline Phosphatase: 75 U/L (ref 39–117)
BUN: 34 mg/dL — ABNORMAL HIGH (ref 6–23)
CO2: 18 mEq/L — ABNORMAL LOW (ref 19–32)
GFR calc Af Amer: 60 mL/min — ABNORMAL LOW (ref >90)
GFR calc non Af Amer: 52 mL/min — ABNORMAL LOW (ref >90)
Glucose, Bld: 424 mg/dL — ABNORMAL HIGH (ref 70–99)
Potassium: 5.8 mEq/L — ABNORMAL HIGH (ref 3.5–5.1)
Sodium: 130 mEq/L — ABNORMAL LOW (ref 135–145)

## 2011-01-01 LAB — GLUCOSE, CAPILLARY
Glucose-Capillary: 148 mg/dL — ABNORMAL HIGH (ref 70–99)
Glucose-Capillary: 169 mg/dL — ABNORMAL HIGH (ref 70–99)
Glucose-Capillary: 32 mg/dL — CL (ref 70–99)
Glucose-Capillary: 452 mg/dL — ABNORMAL HIGH (ref 70–99)
Glucose-Capillary: 532 mg/dL — ABNORMAL HIGH (ref 70–99)

## 2011-01-01 LAB — CBC
HCT: 30.1 % — ABNORMAL LOW (ref 39.0–52.0)
Hemoglobin: 10 g/dL — ABNORMAL LOW (ref 13.0–17.0)
MCH: 27.9 pg (ref 26.0–34.0)
MCHC: 33.2 g/dL (ref 30.0–36.0)
MCV: 84.1 fL (ref 78.0–100.0)
RBC: 3.58 MIL/uL — ABNORMAL LOW (ref 4.22–5.81)

## 2011-01-01 LAB — URINE MICROSCOPIC-ADD ON

## 2011-01-01 LAB — HEMOGLOBIN A1C: Mean Plasma Glucose: 154 mg/dL — ABNORMAL HIGH (ref <117)

## 2011-01-01 LAB — LACTIC ACID, PLASMA: Lactic Acid, Venous: 0.8 mmol/L (ref 0.5–2.2)

## 2011-01-01 LAB — CARDIAC PANEL(CRET KIN+CKTOT+MB+TROPI)
CK, MB: 15.4 ng/mL (ref 0.3–4.0)
Relative Index: 7.3 — ABNORMAL HIGH (ref 0.0–2.5)
Total CK: 210 U/L (ref 7–232)
Troponin I: <0.3 ng/mL (ref <0.30)

## 2011-01-01 LAB — GLUCOSE, RANDOM: Glucose, Bld: 551 mg/dL (ref 70–99)

## 2011-01-01 MED ORDER — SODIUM CHLORIDE 0.9 % IV SOLN
INTRAVENOUS | Status: DC
  Administered 2011-01-01: 10:00:00 via INTRAVENOUS
  Filled 2011-01-01: qty 1

## 2011-01-01 MED ORDER — INSULIN ASPART 100 UNIT/ML ~~LOC~~ SOLN
10.0000 [IU] | Freq: Once | SUBCUTANEOUS | Status: AC
  Administered 2011-01-01: 10 [IU] via SUBCUTANEOUS

## 2011-01-01 MED ORDER — SODIUM POLYSTYRENE SULFONATE 15 GM/60ML PO SUSP
45.0000 g | Freq: Once | ORAL | Status: AC
  Administered 2011-01-01: 45 g via ORAL
  Filled 2011-01-01: qty 180

## 2011-01-01 MED ORDER — SODIUM CHLORIDE 0.9 % IV SOLN
INTRAVENOUS | Status: DC
  Administered 2011-01-01 (×2): 1000 mL via INTRAVENOUS
  Administered 2011-01-02: 02:00:00 via INTRAVENOUS

## 2011-01-01 MED ORDER — INSULIN PUMP
Freq: Three times a day (TID) | SUBCUTANEOUS | Status: DC
  Administered 2011-01-01: 8 via SUBCUTANEOUS
  Administered 2011-01-01: 6 via SUBCUTANEOUS
  Filled 2011-01-01: qty 1

## 2011-01-01 MED ORDER — DEXTROSE 50 % IV SOLN: 25.0000 mL | INTRAVENOUS | Status: DC | PRN

## 2011-01-01 NOTE — Progress Notes (Signed)
Checked insulin pump settings. Basal rates are as follows: 12 MN to 8 AM= 1.4 units/hr; 8 AM to 5 PM = 1.8 units/hr; 5 PM to 12 MN = 1.9 units/hr for a total of 40.7 units every 24 hours.  Correction factor/sensitivity is 35. 1:5 is insulin CHO ratio.  Target glucose is 100 mg/dl. Patient seems very knowledgeable regarding pump.  He sees Dr. Laurance Flatten in [place].  The PharmD/CDE's in that office help him manage the pump.  About 2 months ago his basal settings were reduced across the board by 10% because of recent weight loss.  States that he thinks his night-time basal rate may need further downward adjustment as he has some low's during the night and early morning.  His mother has gone home to get pump supplies in case his site needs to be changed.  Patient hoping to get back on pump soon.

## 2011-01-01 NOTE — Progress Notes (Signed)
CRITICAL VALUE ALERT  Critical value received: CBG 551  Date of notification:  01/01/11  Time of notification: 0950  Critical value read back:yes  Nurse who received alert:  Philomena Doheny  MD notified (1st page):  MD already aware of value. Pt. Started on insulin drip  Time of first page: MD already aware  MD notified (2nd page):  Time of second page:  Responding MD: Dr. Roderic Palau  Time MD responded: MD already aware

## 2011-01-01 NOTE — Progress Notes (Signed)
CRITICAL VALUE ALERT  Critical value received:  ckmb  Date of notification:  12/31/10  Time of notification:  2145 Critical value read back:yes  Nurse who received alert:  Willaim Bane  MD notified (1st page):  johnson  Time of first page:  2330  MD notified (2nd page):  Time of second page:  Responding MD:  Wynetta Emery  Time MD responded:  0000

## 2011-01-01 NOTE — Progress Notes (Signed)
Patient arrived to floor from the ed with diagnosis of hyperkalemia. He received kayexalate in the ed. He also received  Insulin . He wears an insulin pump and was wearing the pump when he received the insulin in the ed. When he arrived to the floor he try to  go to bathroom. He did not exhibit any signs of hypoglycemia. When trying to go to the bathroom, he got caught between the iv pole and the bedside table. Patient states he did not fall but when his fsbs was check his blood sugar was 32. Patient was assess no injuries was noted and he denied any pain.

## 2011-01-01 NOTE — Progress Notes (Signed)
CBG: 32  Treatment: 30 gm carbohydrates  Symptoms: None  Follow-up CBG: Time:0030 CBG Result:36 Recheck @ 0100 60  More carbohydrates given and recheck @ 0130 fsbs 175  Possible Reasons for Event: medication management  Comments/MD notified:critical progress note written per protocol    Sheran Luz

## 2011-01-01 NOTE — Progress Notes (Signed)
*  PRELIMINARY RESULTS* Echocardiogram 2D Echocardiogram has been performed.  Roxine Caddy [facility] 01/01/2011, 10:46 AM

## 2011-01-01 NOTE — Progress Notes (Signed)
Subjective: No complaints today, no shortness of breath, no chest pain. Feels a little weak.  Objective:  Vital signs in last 24 hours:  Filed Vitals:   12/31/10 2204 12/31/10 2238 01/01/11 0011 01/01/11 0600  BP: 128/71 122/83 141/75 122/70  Pulse: 89 104 109 96  Temp: 99.1 F (37.3 C) 98.8 F (37.1 C) 97.8 F (36.6 C) 98.8 F (37.1 C)  TempSrc: Oral Oral Oral Oral  Resp: 18 20 20 20   Height:  5\' 10"  (1.778 m)    Weight:  81.8 kg (180 lb 5.4 oz)    SpO2: 99% 98% 100% 95%    Intake/Output from previous day:   Intake/Output Summary (Last 24 hours) at 01/01/11 0931 Last data filed at 01/01/11 0601  Gross per 24 hour  Intake    240 ml  Output      4 ml  Net    236 ml    Physical Exam: General: Alert, awake, oriented x3, in no acute distress. HEENT: No bruits, no goiter. Moist mucous membranes, no scleral icterus, no conjunctival pallor. Heart: Regular rate and rhythm, without murmurs, rubs, gallops. Lungs: Clear to auscultation bilaterally. No wheezing, no rhonchi, no rales.  Abdomen: Soft, nontender, nondistended, positive bowel sounds. Extremities: No clubbing cyanosis or edema,  positive pedal pulses. Neuro: Grossly intact, nonfocal.    Lab Results:  Basic Metabolic Panel:    Component Value Date/Time   NA 130* 01/01/2011 0451   K 5.8* 01/01/2011 0451   CL 102 01/01/2011 0451   CO2 18* 01/01/2011 0451   BUN 34* 01/01/2011 0451   CREATININE 1.61* 01/01/2011 0451   GLUCOSE 424* 01/01/2011 0451   CALCIUM 8.8 01/01/2011 0451   CBC:    Component Value Date/Time   WBC 4.4 01/01/2011 0451   WBC 7.6 07/16/2005 0816   HGB 10.0* 01/01/2011 0451   HGB 12.7* 07/16/2005 0816   HCT 30.1* 01/01/2011 0451   HCT 37.0* 07/16/2005 0816   PLT 125* 01/01/2011 0451   PLT 223 07/16/2005 0816   MCV 84.1 01/01/2011 0451   MCV 80.0* 07/16/2005 0816   NEUTROABS 2.8 07/16/2005 0816   LYMPHSABS 3.2 07/16/2005 0816   MONOABS 1.0* 07/16/2005 0816   EOSABS 0.5 07/16/2005 0816   BASOSABS 0.0 07/16/2005 0816      Lab 01/01/11 0451 12/31/10 1556  WBC 4.4 4.6  HGB 10.0* 10.2*  HCT 30.1* 30.6*  PLT 125* 120*  MCV 84.1 84.8  MCH 27.9 28.3  MCHC 33.2 33.3  RDW 13.8 14.1  LYMPHSABS -- --  MONOABS -- --  EOSABS -- --  BASOSABS -- --  BANDABS -- --    Lab 01/01/11 0451 12/31/10 2145 12/31/10 1556  NA 130* -- 129*  K 5.8* -- 6.1*  CL 102 -- 104  CO2 18* -- 18*  GLUCOSE 424* -- 398*  BUN 34* -- 35*  CREATININE 1.61* -- 1.69*  CALCIUM 8.8 -- 8.9  MG -- 1.4* --   No results found for this basename: INR:5,PROTIME:5 in the last 168 hours Cardiac markers:  Lab 01/01/11 0755 12/31/10 2145  CKMB 15.4* 16.5*  TROPONINI <0.30 <0.30  MYOGLOBIN -- --   No results found for this basename: POCBNP:3 in the last 168 hours No results found for this or any previous visit (from the past 240 hour(s)).  Studies/Results: No results found.  Medications: Scheduled Meds:   . aspirin  81 mg Oral Daily  . enoxaparin (LOVENOX) injection  40 mg Subcutaneous QHS  . ferrous sulfate  325 mg Oral BID  . furosemide  80 mg Intravenous Once  . insulin aspart  10 Units Subcutaneous Once  . insulin aspart  10 Units Subcutaneous Once  . levothyroxine  200 mcg Oral QAC breakfast  . niacin  750 mg Oral QHS  . omega-3 acid ethyl esters  2 g Oral BID  . pantoprazole  40 mg Oral Q1200  . simvastatin  40 mg Oral QHS  . sodium chloride  250 mL Intravenous Once  . sodium chloride  3 mL Intravenous Q12H  . sodium polystyrene  30 g Oral Once  . sodium polystyrene  30 g Oral Once  . testosterone  5 g Transdermal Daily  . venlafaxine  50 mg Oral BID  . DISCONTD: sodium chloride   Intravenous STAT  . DISCONTD: enoxaparin  30 mg Subcutaneous Q24H  . DISCONTD: insulin aspart  0-20 Units Subcutaneous TID WC  . DISCONTD: insulin aspart  0-5 Units Subcutaneous QHS  . DISCONTD: insulin aspart  3 Units Subcutaneous TID WC  . DISCONTD: insulin glargine  10 Units Subcutaneous QHS  .  DISCONTD: insulin regular  10 Units Subcutaneous Once  . DISCONTD: niacin  750 mg Oral QHS   Continuous Infusions:   . sodium chloride    . insulin (NOVOLIN-R) infusion    . DISCONTD: sodium chloride 100 mL/hr at 12/31/10 1823   PRN Meds:.sodium chloride, acetaminophen, acetaminophen, albuterol, dextrose, ondansetron (ZOFRAN) IV, ondansetron, sodium chloride, zolpidem  Assessment/Plan:  #1 hyperkalemia Patient was given Lasix and kayexalate.  His potassium is better today but still elevated.  Will give him IV fluids right now as he appears a little dehydrated.  Recheck potassium later today, continue to monitor on telemetry.  #2 Anemia: Patient reports his baseline hemoglobin is around 10, he denies any evidence of history of GI bleeding. He was told his anemia may be due to his CKD.  Will check stool occult blood.  #3 Chronic kidney disease, stage 3, mod decreased GFR, he states that his baseline creatinine is about 1.5.   #4 Diabetes mellitus Patient is a type 1 Diabetic.  His hyperglycemia is significant >500 this am.  We will discontinue his lantus and novolog and plan to start him on a glucostabilizer. His insulin pump can be restarted once his blood sugars are under control.  We will also ask that the diabetes co-ordinator see the patient. Will check A1C.  #5 hyponatremia likely pseudohyponatremia secondary to elevated CBGs, and expected to improve.  Clinically the patient looks better than his lab values appear.  Once Potassium and hyperglycemia is corrected, he can possibly discharge home in the morning.   LOS: 1 day   Waller Marcussen 01/01/2011, 9:31 AM

## 2011-01-02 LAB — GLUCOSE, CAPILLARY
Glucose-Capillary: 280 mg/dL — ABNORMAL HIGH (ref 70–99)
Glucose-Capillary: 44 mg/dL — CL (ref 70–99)
Glucose-Capillary: 114 mg/dL — ABNORMAL HIGH (ref 70–99)
Glucose-Capillary: 32 mg/dL — CL (ref 70–99)
Glucose-Capillary: 72 mg/dL (ref 70–99)
Glucose-Capillary: 143 mg/dL — ABNORMAL HIGH (ref 70–99)

## 2011-01-02 LAB — BASIC METABOLIC PANEL WITH GFR
BUN: 36 mg/dL — ABNORMAL HIGH (ref 6–23)
CO2: 22 meq/L (ref 19–32)
Calcium: 8.5 mg/dL (ref 8.4–10.5)
Chloride: 105 meq/L (ref 96–112)
Creatinine, Ser: 1.56 mg/dL — ABNORMAL HIGH (ref 0.50–1.35)
GFR calc Af Amer: 62 mL/min — ABNORMAL LOW
GFR calc non Af Amer: 54 mL/min — ABNORMAL LOW
Glucose, Bld: 211 mg/dL — ABNORMAL HIGH (ref 70–99)
Potassium: 4.4 meq/L (ref 3.5–5.1)
Sodium: 136 meq/L (ref 135–145)

## 2011-01-02 NOTE — Progress Notes (Addendum)
MD notified about the hypoglycemic event. No new orders received. Will cont to monitor pt.  Hansel Feinstein RN

## 2011-01-02 NOTE — Discharge Summary (Signed)
HOSPITAL DISCHARGE SUMMARY  Patrick Brown  MRN: [id number]  DOB:1970/12/13  Date of Admission: 12/31/2010 Date of Discharge: 01/02/2011         LOS: 2 days   Attending Physician:Obadiah Dennard A  Patient's PCP:  Redge Gainer  Discharge Diagnoses: Present on Admission:  .Hyperkalemia .Chronic kidney disease, stage 3, mod decreased GFR .Diabetes mellitus .Anemia   Current Discharge Medication List    CONTINUE these medications which have NOT CHANGED   Details  aspirin (ASPIRIN CHILDRENS) 81 MG chewable tablet Chew 81 mg by mouth daily.     dexlansoprazole (DEXILANT) 60 MG capsule Take 60 mg by mouth daily.      ferrous sulfate (KP FERROUS SULFATE) 325 (65 FE) MG tablet Take 325 mg by mouth 2 (two) times daily.      insulin lispro (HUMALOG) 100 UNIT/ML injection by Per Tube route continuous. Pt.  Uses insulin via insulin pump .     levothyroxine (SYNTHROID, LEVOTHROID) 200 MCG tablet Take 200 mcg by mouth.      meloxicam (MOBIC) 15 MG tablet Take 15 mg by mouth as needed.      niacin (NIASPAN) 750 MG CR tablet Take 750 mg by mouth at bedtime.      omega-3 acid ethyl esters (LOVAZA) 1 G capsule Take 2 g by mouth 2 (two) times daily.      simvastatin (ZOCOR) 40 MG tablet Take 40 mg by mouth at bedtime.      testosterone (ANDROGEL) 50 MG/5GM GEL Place 5 g onto the skin daily.      venlafaxine (EFFEXOR) 50 MG tablet Take 50 mg by mouth 2 (two) times daily.      Blood Glucose Monitoring Suppl (B-D LOGIC BLOOD GLUCOSE) W/DEVICE KIT by Does not apply route.      glucose blood test strip 1 each by Other route as needed. Use as instructed     Insulin Admin Supplies (INSULIN PUMP RECORD BOOK) MISC by Does not apply route.      Insulin Infusion Pump DEVI by Does not apply route.      Insulin Infusion Pump KIT by Does not apply route.      !! Insulin Infusion Pump Supplies (INSULIN PUMP SYRINGE RESERVOIR) MISC by Does not apply route.      !! Insulin Infusion Pump Supplies  MISC by Does not apply route.      Lancets MISC by Does not apply route.      zolpidem (AMBIEN) 10 MG tablet Take 10 mg by mouth at bedtime as needed. For sleep     !! - Potential duplicate medications found. Please discuss with provider.        Brief Admission History: Patrick Brown is a 40 year old male who comes to the ER, complaining about generalized weakness, his potassium was 6.1 so he was sent to the ER from his primary care physician's office. He was administered Lasix and kayexalate in the ER The patient states he has not felt well since July of this year, when he was admitted and [facility] hospital, for weight loss diarrhea. His potassium was high, even in July. It was as high as 7.5. At that time he was on Micardis. The patient has experienced some shortness of breath, denies any fever chills rigers or cough. Denies any orthopnea paroxysmal nocturnal dyspnea or dependent edema. He denies any nausea vomiting abdominal pain. He states that he has had a low hemoglobin, for several years and was evaluated by Dr. Kennith Gain, and was told that his  anemia is because of his chronic kidney disease.  Hospital Course: Present on Admission:  .Hyperkalemia .Chronic kidney disease, stage 3, mod decreased GFR .Diabetes mellitus .Anemia:  1. Hyperkalemia: Patient did not have any EKG changes or any other symptoms apart from the generalized weakness. Patient admitted to the hospital, Kayexalate was started and his potassium level went down to 4.4 day of discharge. Patient is not on ACE inhibitors not not on of spironolactone. Probably dehydration what caused the hyperkalemia. Patient instructed to keep himself hydrated and avoid high potassium-containing diet.  2. Hyponatremia: This is also likely secondary to dehydration. After patient started on IV fluids a sodium went up to 136 and a day of discharge.  3. Diabetes mellitus requiring insulin: Patient uses insulin pump. When patient came in to the  hospital he was hyperglycemic. While in the hospital he did have episode of hypoglycemia when his blood sugar down to 32 which is treated with oral glucose. His basal rate was adjusted by the diabetes care coordinator patient to followup with his primary care physician.  4. Chronic and the disease stage III: This is likely secondary to diabetes mellitus patient is not on ACE inhibitor or ARB because of a history of hyperkalemia. Patient is following with Dr. Lorrene Reid. He said he have an appointment already scheduled to discuss about use of ACE inhibitor versus ARB. He does not have symptoms or signs of fluid overload.   Day of Discharge BP 100/64  Pulse 77  Temp(Src) 98.9 F (37.2 C) (Oral)  Resp 19  Ht 5\' 10"  (1.778 m)  Wt 81.8 kg (180 lb 5.4 oz)  BMI 25.88 kg/m2  SpO2 97% Physical Exam: GEN: No acute distress, cooperative with exam PSYCH: He is alert and oriented x4; does not appear anxious does not appear depressed; affect is normal  HEENT: Mucous membranes pink and anicteric;  Mouth: without oral thrush or lesions Eyes: PERRLA; EOM intact;  Neck: no cervical lymphadenopathy nor thyromegaly or carotid bruit; no JVD;  CHEST WALL: No tenderness, symmetrical to breathing bilaterally CHEST: Normal respiration, clear to auscultation bilaterally  HEART: Regular rate and rhythm; no murmurs, rubs or gallops, S1 and S2 heard  BACK: No kyphosis or scoliosis; no CVA tenderness  ABDOMEN:  soft non-tender; no masses, no organomegaly, normal abdominal bowel sounds; no pannus; no intertriginous candida.  EXTREMITIES: No bone or joint deformity; no edema; no ulcerations.  PULSES: 2+ and symmetric, neurovascularity is intact SKIN: Normal hydration no rash or ulceration, no flushing or suspicious lesions  CNS: Cranial nerves 2-12 grossly intact no focal neurologic deficit, coordination is intact gait not tested    Results for orders placed during the hospital encounter of 12/31/10 (from the past 24  hour(s))  GLUCOSE, CAPILLARY     Status: Abnormal   Collection Time   01/01/11 11:35 AM      Component Value Range   Glucose-Capillary 452 (*) 70 - 99 (mg/dL)  GLUCOSE, CAPILLARY     Status: Abnormal   Collection Time   01/01/11 12:36 PM      Component Value Range   Glucose-Capillary 310 (*) 70 - 99 (mg/dL)  GLUCOSE, CAPILLARY     Status: Abnormal   Collection Time   01/01/11  1:37 PM      Component Value Range   Glucose-Capillary 211 (*) 70 - 99 (mg/dL)  GLUCOSE, CAPILLARY     Status: Abnormal   Collection Time   01/01/11  2:44 PM      Component Value Range  Glucose-Capillary 148 (*) 70 - 99 (mg/dL)  URINALYSIS, ROUTINE W REFLEX MICROSCOPIC     Status: Abnormal   Collection Time   01/01/11  3:02 PM      Component Value Range   Color, Urine YELLOW  YELLOW    Appearance CLEAR  CLEAR    Specific Gravity, Urine 1.022  1.005 - 1.030    pH 5.5  5.0 - 8.0    Glucose, UA >1000 (*) NEGATIVE (mg/dL)   Hgb urine dipstick TRACE (*) NEGATIVE    Bilirubin Urine NEGATIVE  NEGATIVE    Ketones, ur NEGATIVE  NEGATIVE (mg/dL)   Protein, ur NEGATIVE  NEGATIVE (mg/dL)   Urobilinogen, UA 0.2  0.0 - 1.0 (mg/dL)   Nitrite NEGATIVE  NEGATIVE    Leukocytes, UA NEGATIVE  NEGATIVE   URINE MICROSCOPIC-ADD ON     Status: Normal   Collection Time   01/01/11  3:02 PM      Component Value Range   RBC / HPF 0-2  <3 (RBC/hpf)   Bacteria, UA RARE  RARE   HEMOGLOBIN A1C     Status: Abnormal   Collection Time   01/01/11  3:21 PM      Component Value Range   Hemoglobin A1C 7.1 (*) <5.7 (%)   Mean Plasma Glucose 157 (*) <117 (mg/dL)  LACTIC ACID, PLASMA     Status: Normal   Collection Time   01/01/11  3:22 PM      Component Value Range   Lactic Acid, Venous 0.8  0.5 - 2.2 (mmol/L)  POTASSIUM     Status: Abnormal   Collection Time   01/01/11  3:26 PM      Component Value Range   Potassium 5.7 (*) 3.5 - 5.1 (mEq/L)  GLUCOSE, CAPILLARY     Status: Abnormal   Collection Time   01/01/11  3:53 PM       Component Value Range   Glucose-Capillary 132 (*) 70 - 99 (mg/dL)  GLUCOSE, CAPILLARY     Status: Abnormal   Collection Time   01/01/11  4:53 PM      Component Value Range   Glucose-Capillary 125 (*) 70 - 99 (mg/dL)  GLUCOSE, CAPILLARY     Status: Abnormal   Collection Time   01/01/11  5:51 PM      Component Value Range   Glucose-Capillary 169 (*) 70 - 99 (mg/dL)  OCCULT BLOOD X 1 CARD TO LAB, STOOL     Status: Normal   Collection Time   01/01/11  8:32 PM      Component Value Range   Fecal Occult Bld POSITIVE    GLUCOSE, CAPILLARY     Status: Abnormal   Collection Time   01/01/11  9:42 PM      Component Value Range   Glucose-Capillary 280 (*) 70 - 99 (mg/dL)  BASIC METABOLIC PANEL     Status: Abnormal   Collection Time   01/01/11 10:48 PM      Component Value Range   Sodium 136  135 - 145 (mEq/L)   Potassium 4.4  3.5 - 5.1 (mEq/L)   Chloride 105  96 - 112 (mEq/L)   CO2 22  19 - 32 (mEq/L)   Glucose, Bld 211 (*) 70 - 99 (mg/dL)   BUN 36 (*) 6 - 23 (mg/dL)   Creatinine, Ser 1.56 (*) 0.50 - 1.35 (mg/dL)   Calcium 8.5  8.4 - 10.5 (mg/dL)   GFR calc non Af Amer 54 (*) >90 (mL/min)  GFR calc Af Amer 62 (*) >90 (mL/min)  GLUCOSE, CAPILLARY     Status: Abnormal   Collection Time   01/02/11  2:05 AM      Component Value Range   Glucose-Capillary 44 (*) 70 - 99 (mg/dL)  GLUCOSE, CAPILLARY     Status: Abnormal   Collection Time   01/02/11  3:02 AM      Component Value Range   Glucose-Capillary 114 (*) 70 - 99 (mg/dL)  GLUCOSE, CAPILLARY     Status: Abnormal   Collection Time   01/02/11  7:05 AM      Component Value Range   Glucose-Capillary 32 (*) 70 - 99 (mg/dL)   Comment 1 Notify RN     Comment 2 Documented in Chart    GLUCOSE, CAPILLARY     Status: Normal   Collection Time   01/02/11  7:45 AM      Component Value Range   Glucose-Capillary 72  70 - 99 (mg/dL)   Comment 1 Notify RN     Comment 2 Documented in Chart      Disposition: Home   Follow-up  Appts: Discharge Orders    Future Orders Please Complete By Expires   Diet - low sodium heart healthy      Diet Carb Modified      Increase activity slowly         Follow-up Information    Follow up with Clois Dupes, MD. Make an appointment in 10 days.   Contact information:   [facility] [phone]           Signed: Verlee Monte A 01/02/2011, 10:30 AM

## 2011-01-02 NOTE — Progress Notes (Signed)
CBG: 44  Treatment: 15 GM carbohydrate snack  Symptoms: None  Follow-up CBG: Time:0302 CBG Result:114  Possible Reasons for Event: Other: possible due to insulin pump parameter  Comments/MD notified:in progress    [place] Greb D

## 2011-01-02 NOTE — Progress Notes (Signed)
CBG: 32  Treatment: 15 GM carbohydrate snack  Symptoms: None  Follow-up CBG: Time:0750  CBG Result:72  Possible Reasons for Event: Unknown  Comments/MD notified:pt on insulin pump and feels like his basal insulin needs readjusting    Patrick Brown

## 2011-01-14 ENCOUNTER — Other Ambulatory Visit: Payer: Self-pay | Admitting: Nephrology

## 2011-01-14 MED ORDER — FERUMOXYTOL INJECTION 510 MG/17 ML: 510.0000 mg | INTRAVENOUS | Status: AC

## 2011-01-22 ENCOUNTER — Encounter (HOSPITAL_COMMUNITY)
Admission: RE | Admit: 2011-01-22 | Discharge: 2011-01-22 | Disposition: A | Discharge status: Home / Self Care | Payer: BC Managed Care – PPO | Source: Ambulatory Visit | Attending: Nephrology | Admitting: Nephrology

## 2011-01-22 DIAGNOSIS — D649 Anemia, unspecified: Secondary | ICD-10-CM | POA: Insufficient documentation

## 2011-01-22 MED ORDER — FERUMOXYTOL INJECTION 510 MG/17 ML
510.0000 mg | INTRAVENOUS | Status: DC
  Administered 2011-01-22: 510 mg via INTRAVENOUS

## 2011-01-22 MED ORDER — FERUMOXYTOL INJECTION 510 MG/17 ML
INTRAVENOUS | Status: AC
  Administered 2011-01-22: 510 mg via INTRAVENOUS
  Filled 2011-01-22: qty 17

## 2011-01-30 ENCOUNTER — Ambulatory Visit (HOSPITAL_COMMUNITY)
Admission: RE | Admit: 2011-01-30 | Discharge: 2011-01-30 | Disposition: A | Discharge status: Home / Self Care | Payer: BC Managed Care – PPO | Source: Ambulatory Visit | Attending: Nephrology | Admitting: Nephrology

## 2011-01-30 DIAGNOSIS — E119 Type 2 diabetes mellitus without complications: Secondary | ICD-10-CM | POA: Insufficient documentation

## 2011-01-30 DIAGNOSIS — D649 Anemia, unspecified: Secondary | ICD-10-CM | POA: Insufficient documentation

## 2011-01-30 DIAGNOSIS — N183 Chronic kidney disease, stage 3 unspecified: Secondary | ICD-10-CM | POA: Insufficient documentation

## 2011-01-30 DIAGNOSIS — E875 Hyperkalemia: Secondary | ICD-10-CM | POA: Insufficient documentation

## 2011-01-30 MED ORDER — FERUMOXYTOL INJECTION 510 MG/17 ML
INTRAVENOUS | Status: AC
  Administered 2011-01-30: 510 mg via INTRAVENOUS
  Filled 2011-01-30: qty 17

## 2011-01-30 MED ORDER — FERUMOXYTOL INJECTION 510 MG/17 ML
510.0000 mg | INTRAVENOUS | Status: AC
  Administered 2011-01-30: 510 mg via INTRAVENOUS

## 2011-03-11 ENCOUNTER — Other Ambulatory Visit (HOSPITAL_COMMUNITY): Payer: Self-pay | Admitting: *Deleted

## 2011-03-13 ENCOUNTER — Encounter (HOSPITAL_COMMUNITY): Payer: BC Managed Care – PPO

## 2011-03-14 ENCOUNTER — Encounter (HOSPITAL_COMMUNITY)
Admission: RE | Admit: 2011-03-14 | Discharge: 2011-03-14 | Disposition: A | Discharge status: Home / Self Care | Payer: BC Managed Care – PPO | Source: Ambulatory Visit | Attending: Nephrology | Admitting: Nephrology

## 2011-03-14 DIAGNOSIS — D649 Anemia, unspecified: Secondary | ICD-10-CM | POA: Insufficient documentation

## 2011-03-14 MED ORDER — EPOETIN ALFA 10000 UNIT/ML IJ SOLN
INTRAMUSCULAR | Status: AC
  Administered 2011-03-14: 10000 [IU] via SUBCUTANEOUS
  Filled 2011-03-14: qty 1

## 2011-03-14 MED ORDER — EPOETIN ALFA 10000 UNIT/ML IJ SOLN
10000.0000 [IU] | INTRAMUSCULAR | Status: DC
  Administered 2011-03-14: 10000 [IU] via SUBCUTANEOUS

## 2011-04-19 ENCOUNTER — Encounter (HOSPITAL_COMMUNITY)
Admission: RE | Admit: 2011-04-19 | Discharge: 2011-04-19 | Disposition: A | Discharge status: Home / Self Care | Payer: BC Managed Care – PPO | Source: Ambulatory Visit | Attending: Nephrology | Admitting: Nephrology

## 2011-04-19 DIAGNOSIS — D649 Anemia, unspecified: Secondary | ICD-10-CM | POA: Insufficient documentation

## 2011-04-19 MED ORDER — EPOETIN ALFA 10000 UNIT/ML IJ SOLN
10000.0000 [IU] | INTRAMUSCULAR | Status: DC
  Administered 2011-04-19: 10000 [IU] via SUBCUTANEOUS
  Filled 2011-04-19: qty 1

## 2011-04-20 LAB — IRON AND TIBC
Iron: 97 ug/dL (ref 42–135)
Saturation Ratios: 37 % (ref 20–55)
UIBC: 168 ug/dL (ref 125–400)

## 2011-04-20 LAB — FERRITIN: Ferritin: 1229 ng/mL — ABNORMAL HIGH (ref 22–322)

## 2011-05-08 ENCOUNTER — Other Ambulatory Visit: Payer: Self-pay

## 2011-05-08 ENCOUNTER — Inpatient Hospital Stay (HOSPITAL_COMMUNITY)
Admission: EM | Admit: 2011-05-08 | Discharge: 2011-05-09 | DRG: 296 | Disposition: A | Discharge status: Home / Self Care | Payer: BC Managed Care – PPO | Attending: Internal Medicine | Admitting: Internal Medicine

## 2011-05-08 ENCOUNTER — Encounter (HOSPITAL_COMMUNITY): Payer: Self-pay

## 2011-05-08 DIAGNOSIS — N179 Acute kidney failure, unspecified: Secondary | ICD-10-CM | POA: Diagnosis present

## 2011-05-08 DIAGNOSIS — E785 Hyperlipidemia, unspecified: Secondary | ICD-10-CM | POA: Diagnosis present

## 2011-05-08 DIAGNOSIS — E039 Hypothyroidism, unspecified: Secondary | ICD-10-CM | POA: Diagnosis present

## 2011-05-08 DIAGNOSIS — E860 Dehydration: Secondary | ICD-10-CM | POA: Diagnosis present

## 2011-05-08 DIAGNOSIS — N039 Chronic nephritic syndrome with unspecified morphologic changes: Secondary | ICD-10-CM | POA: Diagnosis present

## 2011-05-08 DIAGNOSIS — N189 Chronic kidney disease, unspecified: Secondary | ICD-10-CM

## 2011-05-08 DIAGNOSIS — D631 Anemia in chronic kidney disease: Secondary | ICD-10-CM | POA: Diagnosis present

## 2011-05-08 DIAGNOSIS — E11319 Type 2 diabetes mellitus with unspecified diabetic retinopathy without macular edema: Secondary | ICD-10-CM | POA: Diagnosis present

## 2011-05-08 DIAGNOSIS — Z79899 Other long term (current) drug therapy: Secondary | ICD-10-CM

## 2011-05-08 DIAGNOSIS — R739 Hyperglycemia, unspecified: Secondary | ICD-10-CM

## 2011-05-08 DIAGNOSIS — J019 Acute sinusitis, unspecified: Secondary | ICD-10-CM | POA: Diagnosis present

## 2011-05-08 DIAGNOSIS — Z9641 Presence of insulin pump (external) (internal): Secondary | ICD-10-CM

## 2011-05-08 DIAGNOSIS — E109 Type 1 diabetes mellitus without complications: Secondary | ICD-10-CM | POA: Diagnosis present

## 2011-05-08 DIAGNOSIS — E875 Hyperkalemia: Principal | ICD-10-CM | POA: Diagnosis present

## 2011-05-08 DIAGNOSIS — E1039 Type 1 diabetes mellitus with other diabetic ophthalmic complication: Secondary | ICD-10-CM | POA: Diagnosis present

## 2011-05-08 DIAGNOSIS — N058 Unspecified nephritic syndrome with other morphologic changes: Secondary | ICD-10-CM | POA: Diagnosis present

## 2011-05-08 DIAGNOSIS — Z794 Long term (current) use of insulin: Secondary | ICD-10-CM

## 2011-05-08 DIAGNOSIS — IMO0002 Reserved for concepts with insufficient information to code with codable children: Secondary | ICD-10-CM

## 2011-05-08 DIAGNOSIS — Z7982 Long term (current) use of aspirin: Secondary | ICD-10-CM

## 2011-05-08 DIAGNOSIS — D649 Anemia, unspecified: Secondary | ICD-10-CM

## 2011-05-08 DIAGNOSIS — E1065 Type 1 diabetes mellitus with hyperglycemia: Secondary | ICD-10-CM | POA: Diagnosis present

## 2011-05-08 DIAGNOSIS — N183 Chronic kidney disease, stage 3 unspecified: Secondary | ICD-10-CM | POA: Diagnosis present

## 2011-05-08 DIAGNOSIS — E871 Hypo-osmolality and hyponatremia: Secondary | ICD-10-CM

## 2011-05-08 DIAGNOSIS — E1029 Type 1 diabetes mellitus with other diabetic kidney complication: Secondary | ICD-10-CM | POA: Diagnosis present

## 2011-05-08 HISTORY — DX: Lipoprotein deficiency: E78.6

## 2011-05-08 LAB — BASIC METABOLIC PANEL
BUN: 43 mg/dL — ABNORMAL HIGH (ref 6–23)
Calcium: 9.1 mg/dL (ref 8.4–10.5)
Chloride: 112 mEq/L (ref 96–112)
Creatinine, Ser: 1.46 mg/dL — ABNORMAL HIGH (ref 0.50–1.35)
GFR calc Af Amer: 63 mL/min — ABNORMAL LOW (ref >90)
GFR calc Af Amer: 67 mL/min — ABNORMAL LOW (ref >90)
GFR calc non Af Amer: 54 mL/min — ABNORMAL LOW (ref >90)
GFR calc non Af Amer: 58 mL/min — ABNORMAL LOW (ref >90)
Potassium: >7.5 mEq/L (ref 3.5–5.1)
Potassium: 5.8 mEq/L — ABNORMAL HIGH (ref 3.5–5.1)
Sodium: 130 mEq/L — ABNORMAL LOW (ref 135–145)

## 2011-05-08 LAB — POCT I-STAT, CHEM 8
Calcium, Ion: 1.3 mmol/L (ref 1.12–1.32)
Creatinine, Ser: 1.6 mg/dL — ABNORMAL HIGH (ref 0.50–1.35)
Glucose, Bld: 128 mg/dL — ABNORMAL HIGH (ref 70–99)
HCT: 33 % — ABNORMAL LOW (ref 39.0–52.0)
Hemoglobin: 11.2 g/dL — ABNORMAL LOW (ref 13.0–17.0)
Potassium: 5.8 mEq/L — ABNORMAL HIGH (ref 3.5–5.1)

## 2011-05-08 LAB — URINALYSIS, ROUTINE W REFLEX MICROSCOPIC
Leukocytes, UA: NEGATIVE
Nitrite: NEGATIVE
Protein, ur: 30 mg/dL — AB
Specific Gravity, Urine: 1.021 (ref 1.005–1.030)
Urobilinogen, UA: 0.2 mg/dL (ref 0.0–1.0)

## 2011-05-08 LAB — GLUCOSE, CAPILLARY
Glucose-Capillary: 264 mg/dL — ABNORMAL HIGH (ref 70–99)
Glucose-Capillary: 100 mg/dL — ABNORMAL HIGH (ref 70–99)
Glucose-Capillary: 167 mg/dL — ABNORMAL HIGH (ref 70–99)
Glucose-Capillary: 56 mg/dL — ABNORMAL LOW (ref 70–99)
Glucose-Capillary: 43 mg/dL — CL (ref 70–99)

## 2011-05-08 LAB — DIFFERENTIAL
Basophils Absolute: 0 10*3/uL (ref 0.0–0.1)
Basophils Relative: 0 % (ref 0–1)
Eosinophils Absolute: 0.3 10*3/uL (ref 0.0–0.7)
Lymphocytes Relative: 50 % — ABNORMAL HIGH (ref 12–46)
Monocytes Absolute: 0.4 10*3/uL (ref 0.1–1.0)
Neutrophils Relative %: 35 % — ABNORMAL LOW (ref 43–77)

## 2011-05-08 LAB — CBC
Hemoglobin: 11.1 g/dL — ABNORMAL LOW (ref 13.0–17.0)
MCHC: 34 g/dL (ref 30.0–36.0)
Platelets: 76 10*3/uL — ABNORMAL LOW (ref 150–400)

## 2011-05-08 LAB — URINE MICROSCOPIC-ADD ON

## 2011-05-08 MED ORDER — PANTOPRAZOLE SODIUM 40 MG PO TBEC
80.0000 mg | DELAYED_RELEASE_TABLET | Freq: Every day | ORAL | Status: DC
  Administered 2011-05-09: 80 mg via ORAL
  Filled 2011-05-08: qty 2

## 2011-05-08 MED ORDER — ACETAMINOPHEN 325 MG PO TABS
650.0000 mg | ORAL_TABLET | Freq: Four times a day (QID) | ORAL | Status: DC | PRN
  Administered 2011-05-08: 650 mg via ORAL
  Filled 2011-05-08: qty 2

## 2011-05-08 MED ORDER — DEXTROSE 50 % IV SOLN
25.0000 mL | Freq: Once | INTRAVENOUS | Status: AC
  Administered 2011-05-09: 25 mL via INTRAVENOUS
  Filled 2011-05-08: qty 50

## 2011-05-08 MED ORDER — TESTOSTERONE 50 MG/5GM (1%) TD GEL
5.0000 g | Freq: Every day | TRANSDERMAL | Status: DC
  Administered 2011-05-09: 5 g via TRANSDERMAL
  Filled 2011-05-08: qty 5

## 2011-05-08 MED ORDER — ASPIRIN 81 MG PO CHEW
81.0000 mg | CHEWABLE_TABLET | Freq: Every day | ORAL | Status: DC
  Administered 2011-05-09: 81 mg via ORAL
  Filled 2011-05-08: qty 1

## 2011-05-08 MED ORDER — VENLAFAXINE HCL 50 MG PO TABS
50.0000 mg | ORAL_TABLET | Freq: Two times a day (BID) | ORAL | Status: DC
  Administered 2011-05-08 – 2011-05-09 (×2): 50 mg via ORAL
  Filled 2011-05-08 (×3): qty 1

## 2011-05-08 MED ORDER — ONDANSETRON HCL 4 MG PO TABS: 4.0000 mg | ORAL_TABLET | Freq: Four times a day (QID) | ORAL | Status: DC | PRN

## 2011-05-08 MED ORDER — SODIUM CHLORIDE 0.9 % IV SOLN
INTRAVENOUS | Status: DC
  Administered 2011-05-08 (×2): via INTRAVENOUS

## 2011-05-08 MED ORDER — ENOXAPARIN SODIUM 40 MG/0.4ML ~~LOC~~ SOLN
40.0000 mg | SUBCUTANEOUS | Status: DC
  Administered 2011-05-08: 40 mg via SUBCUTANEOUS
  Filled 2011-05-08 (×2): qty 0.4

## 2011-05-08 MED ORDER — OMEGA-3-ACID ETHYL ESTERS 1 G PO CAPS
2.0000 g | ORAL_CAPSULE | Freq: Two times a day (BID) | ORAL | Status: DC
  Administered 2011-05-08 – 2011-05-09 (×2): 2 g via ORAL
  Filled 2011-05-08 (×3): qty 2

## 2011-05-08 MED ORDER — INSULIN ASPART 100 UNIT/ML ~~LOC~~ SOLN
10.0000 [IU] | Freq: Once | SUBCUTANEOUS | Status: AC
  Administered 2011-05-08: 10 [IU] via INTRAVENOUS
  Filled 2011-05-08: qty 10

## 2011-05-08 MED ORDER — OXYCODONE HCL 5 MG PO TABS: 5.0000 mg | ORAL_TABLET | ORAL | Status: DC | PRN

## 2011-05-08 MED ORDER — LEVOTHYROXINE SODIUM 200 MCG PO TABS
200.0000 ug | ORAL_TABLET | Freq: Every day | ORAL | Status: DC
  Administered 2011-05-09: 200 ug via ORAL
  Filled 2011-05-08: qty 1

## 2011-05-08 MED ORDER — SIMVASTATIN 40 MG PO TABS
40.0000 mg | ORAL_TABLET | Freq: Every day | ORAL | Status: DC
  Administered 2011-05-08: 40 mg via ORAL
  Filled 2011-05-08 (×2): qty 1

## 2011-05-08 MED ORDER — FERROUS SULFATE 325 (65 FE) MG PO TABS
325.0000 mg | ORAL_TABLET | Freq: Two times a day (BID) | ORAL | Status: DC
  Administered 2011-05-08 – 2011-05-09 (×2): 325 mg via ORAL
  Filled 2011-05-08 (×3): qty 1

## 2011-05-08 MED ORDER — SODIUM CHLORIDE 0.9 % IV BOLUS (SEPSIS)
1000.0000 mL | Freq: Once | INTRAVENOUS | Status: AC
  Administered 2011-05-08: 1000 mL via INTRAVENOUS

## 2011-05-08 MED ORDER — SODIUM POLYSTYRENE SULFONATE 15 GM/60ML PO SUSP
45.0000 g | Freq: Once | ORAL | Status: AC
  Administered 2011-05-08: 45 g via ORAL
  Filled 2011-05-08: qty 180

## 2011-05-08 MED ORDER — SODIUM POLYSTYRENE SULFONATE 15 GM/60ML PO SUSP
15.0000 g | Freq: Once | ORAL | Status: AC
  Administered 2011-05-09: 15 g via ORAL
  Filled 2011-05-08: qty 60

## 2011-05-08 MED ORDER — ZOLPIDEM TARTRATE 10 MG PO TABS: 10.0000 mg | ORAL_TABLET | Freq: Every evening | ORAL | Status: DC | PRN

## 2011-05-08 MED ORDER — MECLIZINE HCL 12.5 MG PO TABS
12.5000 mg | ORAL_TABLET | Freq: Three times a day (TID) | ORAL | Status: DC | PRN
  Filled 2011-05-08: qty 1

## 2011-05-08 MED ORDER — NIACIN ER (ANTIHYPERLIPIDEMIC) 500 MG PO TBCR: 1500.0000 mg | EXTENDED_RELEASE_TABLET | Freq: Every day | ORAL | Status: DC

## 2011-05-08 MED ORDER — CEFDINIR 300 MG PO CAPS: 300.0000 mg | ORAL_CAPSULE | Freq: Two times a day (BID) | ORAL | Status: DC

## 2011-05-08 MED ORDER — CEFDINIR 300 MG PO CAPS
300.0000 mg | ORAL_CAPSULE | Freq: Two times a day (BID) | ORAL | Status: DC
  Administered 2011-05-09: 300 mg via ORAL
  Filled 2011-05-08: qty 1

## 2011-05-08 MED ORDER — FLUTICASONE PROPIONATE 50 MCG/ACT NA SUSP
2.0000 | Freq: Every day | NASAL | Status: DC
  Administered 2011-05-08 – 2011-05-09 (×2): 2 via NASAL
  Filled 2011-05-08: qty 16

## 2011-05-08 MED ORDER — SODIUM CHLORIDE 0.9 % IJ SOLN: 3.0000 mL | Freq: Two times a day (BID) | INTRAMUSCULAR | Status: DC

## 2011-05-08 MED ORDER — LORATADINE 10 MG PO TABS
10.0000 mg | ORAL_TABLET | Freq: Every day | ORAL | Status: DC
  Administered 2011-05-08: 10 mg via ORAL
  Filled 2011-05-08 (×2): qty 1

## 2011-05-08 MED ORDER — LEVOCETIRIZINE DIHYDROCHLORIDE 5 MG PO TABS: 2.5000 mg | ORAL_TABLET | Freq: Every evening | ORAL | Status: DC

## 2011-05-08 MED ORDER — ALBUTEROL SULFATE (5 MG/ML) 0.5% IN NEBU: 2.5000 mg | INHALATION_SOLUTION | RESPIRATORY_TRACT | Status: DC | PRN

## 2011-05-08 MED ORDER — NIACIN ER 500 MG PO CPCR
1500.0000 mg | ORAL_CAPSULE | Freq: Every day | ORAL | Status: DC
  Administered 2011-05-08: 1500 mg via ORAL
  Filled 2011-05-08 (×2): qty 3

## 2011-05-08 MED ORDER — CEFUROXIME AXETIL 500 MG PO TABS
500.0000 mg | ORAL_TABLET | Freq: Once | ORAL | Status: AC
  Administered 2011-05-08: 500 mg via ORAL
  Filled 2011-05-08: qty 1

## 2011-05-08 MED ORDER — ONDANSETRON HCL 4 MG/2ML IJ SOLN: 4.0000 mg | Freq: Four times a day (QID) | INTRAMUSCULAR | Status: DC | PRN

## 2011-05-08 MED ORDER — DEXTROSE-NACL 5-0.45 % IV SOLN
INTRAVENOUS | Status: DC
  Administered 2011-05-08 – 2011-05-09 (×2): via INTRAVENOUS

## 2011-05-08 MED ORDER — SODIUM CHLORIDE 0.9 % IV SOLN
INTRAVENOUS | Status: DC
  Administered 2011-05-08: 15:00:00 via INTRAVENOUS

## 2011-05-08 MED ORDER — GLUCOSE 40 % PO GEL
ORAL | Status: AC
  Administered 2011-05-08: 37.5 g
  Filled 2011-05-08: qty 1

## 2011-05-08 MED ORDER — INSULIN PUMP
Freq: Three times a day (TID) | SUBCUTANEOUS | Status: DC
  Administered 2011-05-08: 10 via SUBCUTANEOUS
  Administered 2011-05-09: 12 via SUBCUTANEOUS
  Administered 2011-05-09: 5 via SUBCUTANEOUS

## 2011-05-08 MED ORDER — ACETAMINOPHEN 650 MG RE SUPP: 650.0000 mg | Freq: Four times a day (QID) | RECTAL | Status: DC | PRN

## 2011-05-08 MED ORDER — PANTOPRAZOLE SODIUM 40 MG PO TBEC: 80.0000 mg | DELAYED_RELEASE_TABLET | Freq: Every day | ORAL | Status: DC

## 2011-05-08 NOTE — Progress Notes (Signed)
Patient admitted with hyperkalemia and hyperglycemia.  Uses insulin pump at home. Has had Type 1 diabetes since he was age 41 years.  Got IV insulin and D50 and kayexalate to reduce potassium levels in ED.  Dr. Algis Liming to admit patient to telemetry.  Upon assessment, patient A&O x4 and able to independently operate insulin pump.  Appropriate for pump therapy in hospital.  Had patient sign insulin pump contract.  Gave patient a copy of the insulin pump flow sheet and explained to him how to use it.  Patient agrees to allow RN to check his CBGs with hospital meter.  Patient's PCP is Dr. Laurance Flatten (in [place], [place]).  Gets help from Tessie Fass, PharmD, CDE in managing insulin pump as well.  Patient told me he is getting a new insulin pump and is waiting to be trained on it.  Current basal settings are as follows:  Midnight-8am  1.15 units/hr 8am-5pm 1.8 units/hr 5pm-Midnight  1.9 units/hr  Patient gets a total of 38.7 units total basal insulin per 24 hour period.  Carbohydrate ratio= 1 unit for every 5 grams carbohydrates consumed  Correction factor= 1 unit for every 35 mg/dl above target CBG of 100 mg/dl  Patient due to change set/site tomorrow (05/09/11).  Insulin pump orders placed into EPIC by Dr. Algis Liming.  Receiving RN on 4th floor aware that patient has insulin pump.  Will follow. Wyn Quaker RN, MSN, CDE Diabetes Coordinator Inpatient Diabetes Program [phone]

## 2011-05-08 NOTE — ED Notes (Signed)
[id number] Expected date:05/08/11<BR> Expected time: 9:37 AM<BR> Means of arrival:Ambulance<BR> Comments:<BR> [facility] ems

## 2011-05-08 NOTE — ED Provider Notes (Signed)
History     CSN: [id number]  Arrival date & time 05/08/11  1000   First MD Initiated Contact with Patient 05/08/11 1008      Chief Complaint  Patient presents with  . Hyperglycemia    (Consider location/radiation/quality/duration/timing/severity/associated sxs/prior treatment) HPI Comments: Patient with history of type 1 diabetes presents emergency Department with chief complaint of hyperglycemia.  Associated symptoms include nausea and leg cramping.  Patient states the last 2 days his sugars have been greater than 300.  Patient is currently being treated with antibiotics for sinus infection.  No other complaints at this time.  PCP Dr. Laurance Flatten in [place]  The history is provided by the patient.    Past Medical History  Diagnosis Date  . IDDM (insulin dependent diabetes mellitus)   . Retinopathy     x2  . Proteinuria   . Dyslipidemia   . Anemia, iron deficiency   . Hematuria, microscopic 10/09    work up negative (Dr. Amalia Hailey)  . Diabetes mellitus   . Hypothyroidism     Past Surgical History  Procedure Date  . Eye surgery 2006    x2     Family History  Problem Relation Age of Onset  . Prostate cancer      family history     History  Substance Use Topics  . Smoking status: Never Smoker   . Smokeless tobacco: Never Used  . Alcohol Use: Yes     very rarely      Review of Systems  Constitutional: Negative for fever, chills and appetite change.  HENT: Positive for congestion and rhinorrhea. Negative for facial swelling, neck pain and neck stiffness.   Eyes: Negative for visual disturbance.  Respiratory: Negative for shortness of breath.   Cardiovascular: Negative for chest pain, palpitations and leg swelling.  Gastrointestinal: Positive for nausea. Negative for vomiting, abdominal pain and diarrhea.  Genitourinary: Negative for dysuria, urgency and frequency.  Musculoskeletal: Negative for myalgias, back pain and arthralgias.       Muscle cramping     Neurological: Negative for dizziness, syncope, weakness, light-headedness, numbness and headaches.  Psychiatric/Behavioral: Negative for confusion.  All other systems reviewed and are negative.    Allergies  Altace  Home Medications   Current Outpatient Rx  Name Route Sig Dispense Refill  . ACETAMINOPHEN 500 MG PO TABS Oral Take 1,000 mg by mouth every 6 (six) hours as needed.    . ASPIRIN 81 MG PO CHEW Oral Chew 81 mg by mouth daily.     . BD LOGIC BLOOD GLUCOSE MONITOR W/DEVICE KIT Does not apply by Does not apply route.      Marland Kitchen CEFDINIR 300 MG PO CAPS Oral Take 300 mg by mouth 2 (two) times daily.    . DEXLANSOPRAZOLE 60 MG PO CPDR Oral Take 60 mg by mouth daily.      Marland Kitchen FERROUS SULFATE 325 (65 FE) MG PO TABS Oral Take 325 mg by mouth 2 (two) times daily.      Marland Kitchen FLUTICASONE PROPIONATE 50 MCG/ACT NA SUSP Nasal Place 2 sprays into the nose daily.    Marland Kitchen GLUCOSAMINE 1500 COMPLEX PO Oral Take 2 tablets by mouth.    Marland Kitchen GLUCOSE BLOOD VI STRP Other 1 each by Other route as needed. Use as instructed     . INSULIN PUMP RECORD BOOK MISC Does not apply by Does not apply route.      . INSULIN INFUSION PUMP DEVI Does not apply by Does not apply route.      Marland Kitchen  INSULIN INFUSION PUMP KIT Does not apply by Does not apply route.      . INSULIN PUMP SYRINGE RESERVOIR MISC Does not apply by Does not apply route.      . INSULIN INFUSION PUMP SUPPLIES MISC Does not apply by Does not apply route.      . INSULIN LISPRO (HUMAN) 100 UNIT/ML [place] SOLN Per Tube by Per Tube route continuous. Pt.  Uses insulin via insulin pump .     Marland Kitchen LANCETS MISC Does not apply by Does not apply route.      Marland Kitchen LEVOCETIRIZINE DIHYDROCHLORIDE 5 MG PO TABS Oral Take 2.5 mg by mouth every evening.    Marland Kitchen LEVOTHYROXINE SODIUM 200 MCG PO TABS Oral Take 200 mcg by mouth.      Marland Kitchen MECLIZINE HCL 25 MG PO TABS Oral Take 12.5 mg by mouth 3 (three) times daily as needed. For vertigo    . NIACIN ER (ANTIHYPERLIPIDEMIC) 750 MG PO TBCR Oral Take 1,500  mg by mouth at bedtime.     . OMEGA-3-ACID ETHYL ESTERS 1 G PO CAPS Oral Take 2 g by mouth 2 (two) times daily.      Marland Kitchen SIMVASTATIN 40 MG PO TABS Oral Take 40 mg by mouth at bedtime.      . TESTOSTERONE 50 MG/5GM TD GEL Transdermal Place 5 g onto the skin daily.      . VENLAFAXINE HCL 50 MG PO TABS Oral Take 50 mg by mouth 2 (two) times daily.      Marland Kitchen ZOLPIDEM TARTRATE 10 MG PO TABS Oral Take 10 mg by mouth at bedtime as needed. For sleep      BP 128/83  Pulse 100  Resp 28  Ht 5\' 10"  (1.778 m)  Wt 173 lb 4.8 oz (78.608 kg)  BMI 24.87 kg/m2  Physical Exam  Nursing note and vitals reviewed. Constitutional: He is oriented to person, place, and time. He appears well-developed and well-nourished. No distress.  HENT:  Head: Normocephalic and atraumatic.  Eyes: Conjunctivae and EOM are normal.  Neck: Normal range of motion. Neck supple.  Cardiovascular: Normal rate, regular rhythm, normal heart sounds and intact distal pulses.   Pulmonary/Chest: Effort normal and breath sounds normal. No respiratory distress. He has no wheezes. He has no rales.  Abdominal: Soft. He exhibits no distension and no mass. There is no tenderness. There is no rebound.  Musculoskeletal: Normal range of motion. He exhibits no edema and no tenderness.  Neurological: He is alert and oriented to person, place, and time.  Skin: Skin is warm and dry. No rash noted. He is not diaphoretic.  Psychiatric: He has a normal mood and affect. His behavior is normal.    ED Course  Procedures (including critical care time)  Labs Reviewed  BASIC METABOLIC PANEL - Abnormal; Notable for the following:    Sodium 130 (*)    Potassium >7.5 (*)    CO2 17 (*)    Glucose, Bld 273 (*)    BUN 43 (*)    Creatinine, Ser 1.54 (*)    GFR calc non Af Amer 54 (*)    GFR calc Af Amer 63 (*)    All other components within normal limits  CBC - Abnormal; Notable for the following:    RBC 3.71 (*)    Hemoglobin 11.1 (*)    HCT 32.6 (*)     Platelets 76 (*)    All other components within normal limits  DIFFERENTIAL - Abnormal; Notable for  the following:    Neutrophils Relative 35 (*)    Lymphocytes Relative 50 (*)    Eosinophils Relative 7 (*)    Neutro Abs 1.6 (*)    All other components within normal limits  GLUCOSE, CAPILLARY - Abnormal; Notable for the following:    Glucose-Capillary 264 (*)    All other components within normal limits  BLOOD GAS, VENOUS - Abnormal; Notable for the following:    pCO2, Ven 37.9 (*)    pO2, Ven 26.3 (*)    Bicarbonate 17.4 (*)    Acid-base deficit 8.2 (*)    All other components within normal limits  URINALYSIS, ROUTINE W REFLEX MICROSCOPIC  BLOOD GAS, VENOUS   No results found.   No diagnosis found.   Date: 05/08/2011  Rate: 97  Rhythm: normal sinus rhythm  QRS Axis: left  Intervals: normal  ST/T Wave abnormalities: peaked T waves  Conduction Disutrbances:none  Narrative Interpretation: Left posterior fascicular block   Old EKG Reviewed: changes noted    MDM  Hyperglycemia, hyperkalemia, hyponatremia, EKG changes  Patient hyperkalemia treated in the emergency department with 10 units IV insulin and 45 oral kayexalate. Admitted to triad team 6, telemetry           [place], [place] 05/08/11 1340

## 2011-05-08 NOTE — ED Notes (Signed)
Per EMS pt c/o hyperglycemic cbg 246, pt c/o weakness and "bad" feeling, c/o cramping and vertigo

## 2011-05-08 NOTE — Progress Notes (Signed)
CRITICAL VALUE ALERT  Critical value received:  K= is 5.8: cbg's con't to remain low after txment-[id number]  Date of notification:  05/08/11  Time of notification:  23:00  Critical value read back:yes  Nurse who received alert:  Donnald Garre  MD notified (1st page):  [place]  Time of first page:  23:19  MD notified (2nd page):23:45  Time of second page:  Responding MD:  Roel Cluck  Time MD responded:  23:45

## 2011-05-08 NOTE — H&P (Addendum)
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 He indicates that he's been fighting a sinus infection for approximately 2 weeks. He initially completed 10 days of oral Augmentin treatment but continued to have bitemporal intermittent pain. She was seen again by his primary care physician 4 days back and was switched to oral Omnicef and Flonase nasal sprays. At that time he also had pain in both the ears, dizziness but no vertigo and felt like he might pass out. He denies any fevers or chills or nasal stuffiness and postnasal drip. Yesterday his headache was quite severe. Usually he controls his blood sugars in the 100 mg/dL range but over the last couple of days his blood sugars have ranged in the 300-3 50 mg/dL. This morning he noticed generalized weakness and cramping in his legs which she associates with hyperkalemia episodes that he's had in the past. He thereby presented to the emergency department where his progression was noted to be 7.3. He's been bolused with a liter of normal saline, received 10 units of regular insulin and a dose of Kayexalate and the hospitalist service as requested to admit for further evaluation and management. He had an episode of nausea but no vomiting. He indicates that his leg cramps have resolved. No further earaches or dizziness. His appetite has been chronically poor sense late last year. Since his last admission he has lost 7 pounds of weight. This however is less severe drop compared to last year when he lost 40 pounds. He had EGD and colonoscopy by Dr. Collene Mares which apparently showed Candida  esophagitis and was treated. He had diarrhea then which has since resolved. He has not been on ACE inhibitors or ARB since July secondary to hyperkalemia issues.  Past Medical History: Past Medical History  Diagnosis Date  . IDDM (insulin dependent diabetes mellitus)   . Retinopathy     x2  . Proteinuria   . Dyslipidemia   . Anemia, iron deficiency   . Hematuria, microscopic 10/09    work up negative (Dr. Amalia Hailey)  . Diabetes mellitus   . Hypothyroidism   . CKD (chronic kidney disease)   . Low HDL (under 40)     Past Surgical History: Past Surgical History  Procedure Date  . Eye surgery 2006    x2   . Vitrectomy bilateral  . Insulin pump   . Colonoscopy   . Esophagogastroduodenoscopy     Allergies:   Allergies  Allergen Reactions  . Altace Cough    Medications: Prior to Admission medications   Medication Sig Start Date End Date Taking? Authorizing Provider  acetaminophen (TYLENOL) 500 MG tablet Take 1,000 mg by mouth every 6 (six) hours as needed.   Yes Historical Provider, MD  aspirin (ASPIRIN CHILDRENS) 81 MG chewable tablet Chew 81 mg by mouth daily.    Yes Historical Provider, MD  Blood Glucose Monitoring Suppl (B-D LOGIC BLOOD GLUCOSE) W/DEVICE KIT by Does not apply route.     Yes Historical Provider, MD  cefdinir (OMNICEF) 300 MG capsule Take 300 mg by mouth 2 (two) times daily.   Yes Historical Provider, MD  dexlansoprazole (DEXILANT) 60 MG capsule Take  60 mg by mouth daily.     Yes Historical Provider, MD  ferrous sulfate (KP FERROUS SULFATE) 325 (65 FE) MG tablet Take 325 mg by mouth 2 (two) times daily.     Yes Historical Provider, MD  fluticasone (FLONASE) 50 MCG/ACT nasal spray Place 2 sprays into the nose daily.   Yes Historical Provider, MD  Glucosamine-Chondroit-Vit C-Mn (GLUCOSAMINE 1500 COMPLEX PO) Take 2 tablets by mouth.   Yes Historical Provider, MD  glucose blood test strip 1 each by Other route as needed. Use as instructed    Yes Historical  Provider, MD  Insulin Admin Supplies (INSULIN PUMP RECORD BOOK) MISC by Does not apply route.     Yes Historical Provider, MD  Insulin Infusion Pump DEVI by Does not apply route.     Yes Historical Provider, MD  Insulin Infusion Pump KIT by Does not apply route.     Yes Historical Provider, MD  Insulin Infusion Pump Supplies (INSULIN PUMP SYRINGE RESERVOIR) MISC by Does not apply route.     Yes Historical Provider, MD  Insulin Infusion Pump Supplies MISC by Does not apply route.     Yes Historical Provider, MD  insulin lispro (HUMALOG) 100 UNIT/ML injection by Per Tube route continuous. Pt.  Uses insulin via insulin pump .    Yes Historical Provider, MD  Lancets MISC by Does not apply route.     Yes Historical Provider, MD  levocetirizine (XYZAL) 5 MG tablet Take 2.5 mg by mouth every evening.   Yes Historical Provider, MD  levothyroxine (SYNTHROID, LEVOTHROID) 200 MCG tablet Take 200 mcg by mouth.     Yes Historical Provider, MD  meclizine (ANTIVERT) 25 MG tablet Take 12.5 mg by mouth 3 (three) times daily as needed. For vertigo   Yes Historical Provider, MD  niacin (NIASPAN) 750 MG CR tablet Take 1,500 mg by mouth at bedtime.    Yes Historical Provider, MD  omega-3 acid ethyl esters (LOVAZA) 1 G capsule Take 2 g by mouth 2 (two) times daily.     Yes Historical Provider, MD  simvastatin (ZOCOR) 40 MG tablet Take 40 mg by mouth at bedtime.     Yes Historical Provider, MD  testosterone (ANDROGEL) 50 MG/5GM GEL Place 5 g onto the skin daily.     Yes Historical Provider, MD  venlafaxine (EFFEXOR) 50 MG tablet Take 50 mg by mouth 2 (two) times daily.     Yes Historical Provider, MD  zolpidem (AMBIEN) 10 MG tablet Take 10 mg by mouth at bedtime as needed. For sleep   Yes Historical Provider, MD    Family History: Family History  Problem Relation Age of Onset  . Diabetes type I Brother   . Prostate cancer Other     Social History:  reports that he has never smoked. He has never used smokeless  tobacco. He reports that he drinks alcohol. He reports that he does not use illicit drugs. he is independent of activities of daily living. He single and as indicated above as a paramedic with the [employer].  Review of Systems:  All systems reviewed and apart from history of presenting illness is negative. Patient denies any dyspnea, palpitations, cough or chest pain. He is no constipation, diarrhea or abdominal pain. He has some polyuria but no dysuria. He has dryness of skin with intermittent pruritus for which he sees a dermatologist.  Physical Exam: Filed Vitals:   05/08/11 1017 05/08/11 1044  BP: 128/83   Pulse: 100  Resp: 28   Height:  5\' 10"  (1.778 m)  Weight:  78.608 kg (173 lb 4.8 oz)   General appearance: Moderately built and nourished male patient who is lying comfortably in the gurney and is in no obvious distress.  Head: Nontraumatic and normocephalic.  Eyes: Pupils equally reacting to light and accommodation. Bilateral immature cataracts. Ears: Normal. External auditory canal is clear and tympanic membranes are bright without any acute findings. Nose: No acute findings. No sinus tenderness.  Throat: Mucosa is slightly dry. No oral thrush or any other acute findings.  Neck: Supple. No JVD or carotid bruit. Lymph nodes: No lymphadenopathy.  Resp: Clear to auscultation. No increased work of breathing.  Cardio: First and second heart sounds heard, regular rate and rythm. No murmurs or JVD or gallop or pedal edema.   GI: Non distended. Soft and nontender. No organomegaly or masses appreciated. Normal bowel sounds heard.  Extremities: symmetric 5/5 power. Skin: No other acute findings.  Neurologic: Alert and oriented. No focal neurological deficits.   Labs on Admission:   [facility] 05/08/11 1332 05/08/11 1043  NA 142 130*  K 5.8* >7.5*  CL 116* 104  CO2 -- 17*  GLUCOSE 128* 273*  BUN 43* 43*  CREATININE 1.60* 1.54*  CALCIUM -- 9.1  MG -- --  PHOS -- --   No  results found for this basename: AST:2,ALT:2,ALKPHOS:2,BILITOT:2,PROT:2,ALBUMIN:2 in the last 72 hours No results found for this basename: LIPASE:2,AMYLASE:2 in the last 72 hours  Basename 05/08/11 1332 05/08/11 1043  WBC -- 4.6  NEUTROABS -- 1.6*  HGB 11.2* 11.1*  HCT 33.0* 32.6*  MCV -- 87.9  PLT -- 76*   No results found for this basename: CKTOTAL:3,CKMB:3,CKMBINDEX:3,TROPONINI:3 in the last 72 hours No results found for this basename: TSH,T4TOTAL,FREET3,T3FREE,THYROIDAB in the last 72 hours No results found for this basename: VITAMINB12:2,FOLATE:2,FERRITIN:2,TIBC:2,IRON:2,RETICCTPCT:2 in the last 72 hours  Radiological Exams on Admission: No results found.    Assessment/Plan Present on Admission:  .DM (diabetes mellitus), type 1, uncontrolled .Hyperkalemia .Chronic kidney disease, stage 3, mod decreased GFR .Anemia .Acute on chronic renal failure .Hypothyroid .Dehydration  1. Uncontrolled type 1 diabetes mellitus: Probably precipitated by recent episode of sinusitis. Will continue patient's insulin pump. Diabetes coordinator consulted. Patient indicates that his last A1c in December 2012 was 6.1. He also consumes low sodium diet and adjust insulin as per carbohydrate counting. 2. Mild dehydration: IV fluids 3. Hyperkalemia: Possibly secondary to acute on chronic kidney disease from dehydration. Other differential diagnosis is type IV renal tubular acidosis. Improved after IV fluids, insulin and a dose of Kayexalate. We'll repeat BMP in a couple of hours after continued IV fluid hydration and insulin treatment. Monitor on telemetry. 4. Mild acute on chronic kidney disease: Patient's baseline creatinine is 1.5. Hydrate with IV fluids and monitored daily BMPs. 5. Anemia: Possibly secondary to chronic kidney disease. Stable. 6. Thrombocytopenia: Unclear etiology but is chronic. Follow daily CBCs. 7. Hypothyroidism: Continue home dose of Synthroid. 8. Recent episode of acute  sinusitis: Continue home antibiotics. 9. Dyslipidemia: Continue home regimen. 10. Full Code.   Addendum: EKG at 10:12 AM showed normal sinus rhythm at 97 beats per minute, normal axis, ? Peaked T waves and QTC of 411 ms. EKG at 3:13 PM showed no further peaked T waves. Repeat potassium is 5.8  Patrick Brown 05/08/2011, 2:46 PM

## 2011-05-08 NOTE — Progress Notes (Signed)
Called by RN to inform me that patient has been hypoglycemic but asymptomatic. Will change IVF to D5 1/2 NS and give half an amp of glucose. Continue to monitor closely if persists may need to suspend Pump  K was noted again at 5.8 will give Kayexalate 15g and recheck in AM.  Chart including problem list, medications, labs and vitals were reviewed  Zuri Bradway 11:33 PM

## 2011-05-09 LAB — BASIC METABOLIC PANEL
BUN: 29 mg/dL — ABNORMAL HIGH (ref 6–23)
CO2: 21 mEq/L (ref 19–32)
CO2: 22 mEq/L (ref 19–32)
Calcium: 8.4 mg/dL (ref 8.4–10.5)
Chloride: 109 mEq/L (ref 96–112)
Chloride: 109 mEq/L (ref 96–112)
Creatinine, Ser: 1.37 mg/dL — ABNORMAL HIGH (ref 0.50–1.35)
GFR calc Af Amer: 72 mL/min — ABNORMAL LOW (ref >90)
Glucose, Bld: 239 mg/dL — ABNORMAL HIGH (ref 70–99)
Potassium: 5.3 mEq/L — ABNORMAL HIGH (ref 3.5–5.1)
Sodium: 134 mEq/L — ABNORMAL LOW (ref 135–145)
Sodium: 137 mEq/L (ref 135–145)

## 2011-05-09 LAB — BLOOD GAS, VENOUS
Acid-base deficit: 8.2 mmol/L — ABNORMAL HIGH (ref 0.0–2.0)
O2 Saturation: 50.4 %
TCO2: 16.5 mmol/L (ref 0–100)

## 2011-05-09 LAB — CBC
MCV: 86.7 fL (ref 78.0–100.0)
Platelets: 59 10*3/uL — ABNORMAL LOW (ref 150–400)
RBC: 3.09 MIL/uL — ABNORMAL LOW (ref 4.22–5.81)
RDW: 13.6 % (ref 11.5–15.5)
WBC: 4.3 10*3/uL (ref 4.0–10.5)

## 2011-05-09 LAB — GLUCOSE, CAPILLARY: Glucose-Capillary: 83 mg/dL (ref 70–99)

## 2011-05-09 MED ORDER — SODIUM BICARBONATE 8.4 % IV SOLN
50.0000 meq | Freq: Once | INTRAVENOUS | Status: AC
  Administered 2011-05-09: 50 meq via INTRAVENOUS
  Filled 2011-05-09: qty 50

## 2011-05-09 MED ORDER — SODIUM POLYSTYRENE SULFONATE 15 GM/60ML PO SUSP
30.0000 g | Freq: Once | ORAL | Status: AC
  Administered 2011-05-09: 30 g via ORAL
  Filled 2011-05-09: qty 120

## 2011-05-09 MED ORDER — SODIUM CHLORIDE 0.9 % IV SOLN
INTRAVENOUS | Status: DC
  Administered 2011-05-09: 12:00:00 via INTRAVENOUS

## 2011-05-09 MED ORDER — SODIUM CHLORIDE 0.9 % IV SOLN
1.0000 g | Freq: Once | INTRAVENOUS | Status: AC
  Administered 2011-05-09: 1 g via INTRAVENOUS
  Filled 2011-05-09: qty 10

## 2011-05-09 MED ORDER — INSULIN ASPART 100 UNIT/ML IV SOLN
10.0000 [IU] | Freq: Once | INTRAVENOUS | Status: AC
  Administered 2011-05-09: 10 [IU] via INTRAVENOUS

## 2011-05-09 MED ORDER — OXYCODONE HCL 5 MG PO TABS: 5.0000 mg | ORAL_TABLET | ORAL | Status: AC | PRN

## 2011-05-09 MED ORDER — SODIUM BICARBONATE 4.2 % IV SOLN
50.0000 meq | Freq: Once | INTRAVENOUS | Status: DC
  Filled 2011-05-09: qty 100

## 2011-05-09 MED ORDER — DEXTROSE 50 % IV SOLN
25.0000 g | Freq: Once | INTRAVENOUS | Status: AC
  Administered 2011-05-09: 25 g via INTRAVENOUS
  Filled 2011-05-09: qty 50

## 2011-05-09 MED ORDER — LORATADINE 10 MG PO TABS: 10.0000 mg | ORAL_TABLET | Freq: Every day | ORAL | Status: DC

## 2011-05-09 MED ORDER — DEXLANSOPRAZOLE 60 MG PO CPDR: 60.0000 mg | DELAYED_RELEASE_CAPSULE | Freq: Every day | ORAL | Status: DC

## 2011-05-09 NOTE — Progress Notes (Signed)
CRITICAL VALUE ALERT  Critical value received:  K=6.7 & glucose=227  Date of notification:  05/09/11  Time of notification:  06:19am  Critical value read back:yes  Nurse who received alert:  Donnald Garre  MD notified (1st page):  Dr Roel Cluck  Time of first page:  06:53  MD notified (2nd page):  Time of second page:  Responding MD:  Dr Roel Cluck  Time MD responded:  06:57

## 2011-05-09 NOTE — Progress Notes (Signed)
   CARE MANAGEMENT NOTE 05/09/2011  Patient:  Patrick Brown, Patrick Brown   Account Number:  [id number]  Date Initiated:  05/09/2011  Documentation initiated by:  [facility]  Subjective/Objective Assessment:   ADMITTED W/GEN'L WEAKNESS,LEG CRAMPS     Action/Plan:   FROM HOME W/SUPPORT.   Anticipated DC Date:  05/13/2011   Anticipated DC Plan:  HOME/SELF CARE         Choice offered to / List presented to:             Status of service:  Completed, signed off Medicare Important Message given?   (If response is "NO", the following Medicare IM given date fields will be blank) Date Medicare IM given:   Date Additional Medicare IM given:    Discharge Disposition:  HOME/SELF CARE  Per UR Regulation:  Reviewed for med. necessity/level of care/duration of stay  If discussed at [place] of Stay Meetings, dates discussed:    Comments:  05/09/11 [facility] RN,BSN NCM [id number]

## 2011-05-09 NOTE — Progress Notes (Signed)
Called by RN to inform me that patient's K is not up to 6.7 Ordered insulin IV, 1 amp of bicarb, calcium gluconate and kayexelate 30 Did not give D50 since he is hyperglycemic but will write for 1/2 amp of glucose and recheck in 30 min, Recheck K at 10 am      Chart including problem list, medications, labs and vitals were reviewed  Hussien Greenblatt 7:18 AM

## 2011-05-09 NOTE — Progress Notes (Signed)
Results for Patrick Brown, Patrick Brown (MRN [id number]) as of 05/09/2011 13:53  Ref. Range 05/08/2011 10:21 05/08/2011 14:40 05/08/2011 16:49 05/08/2011 20:28 05/08/2011 22:21 05/08/2011 23:01  Glucose-Capillary Latest Range: 70-99 mg/dL 264 (H) 100 (H) 167 (H) 56 (L) 43 (LL) 57 (L)    Results for Patrick Brown, Patrick Brown (MRN [id number]) as of 05/09/2011 13:53  Ref. Range 05/09/2011 01:16 05/09/2011 07:20 05/09/2011 08:36 05/09/2011 11:58  Glucose-Capillary Latest Range: 70-99 mg/dL 179 (H) 262 (H) 280 (H) 83    Admitted with hyperkalemia.  Wears insulin pump to manage Type1 Diabetes.  Has had diabetes since he was 41 years of age.  CBGs slightly labile.  Patient extremely knowledgeable about his diabetes and very skilled with his insulin pump.  Counts carbohydrates and boluses self with insulin per pump.    Per patient, he is supposed to be d/c'd home today.  Plans to change his pump set/site today after d/c.  Patient did not have any questions for me.  Will follow and assist as needed. Wyn Quaker RN, MSN, CDE Diabetes Coordinator Inpatient Diabetes Program [phone]

## 2011-05-09 NOTE — Progress Notes (Signed)
   CARE MANAGEMENT NOTE 05/09/2011  Patient:  TORBEN, KRAWIEC   Account Number:  [id number]  Date Initiated:  05/09/2011  Documentation initiated by:  [facility]  Subjective/Objective Assessment:   ADMITTED W/GEN'L WEAKNESS,LEG CRAMPS     Action/Plan:   FROM HOME W/SUPPORT.   Anticipated DC Date:  05/13/2011   Anticipated DC Plan:  HOME/SELF CARE         Choice offered to / List presented to:             Status of service:  In process, will continue to follow Medicare Important Message given?   (If response is "NO", the following Medicare IM given date fields will be blank) Date Medicare IM given:   Date Additional Medicare IM given:    Discharge Disposition:    Per UR Regulation:  Reviewed for med. necessity/level of care/duration of stay  If discussed at [place] of Stay Meetings, dates discussed:    Comments:  05/09/11 [facility] RN,BSN NCM [id number]

## 2011-05-09 NOTE — Progress Notes (Signed)
05/09/11-Dr Doutova to order some Kayexalate for K= 6.7.

## 2011-05-09 NOTE — Progress Notes (Signed)
05/09/11-01:15-CBG=179 --1 hr after Dextrose 25mg  given IVP.Marland KitchenMarland KitchenWill not do the 2am CBG-since we are so close to that time to allow the pt to sleep some.Marland KitchenMarland KitchenOf course if pt's condition warrents CBG to need checking- we will definitely do so...theophylline pt has still not had any results either from the Kayexalate given earlier also for K=5.8.Marland KitchenMarland KitchenMarland Kitchenwill con't to monitor for changes or problems & treat as ordered or by protocal.

## 2011-05-09 NOTE — Discharge Summary (Signed)
Physician Discharge Summary  Patient ID: MELECIO WYLY MRN: [id number] DOB/AGE: 07-29-1970 41 y.o.  Admit date: 05/08/2011 Discharge date: 05/09/2011  Primary Care Physician:  Redge Gainer, MD, MD   Discharge Diagnoses:    Active Problems:  Hyperkalemia  Chronic kidney disease, stage 3, mod decreased GFR  Anemia  DM (diabetes mellitus), type 1, uncontrolled  Acute on chronic renal failure  Hypothyroid  Dehydration   Medication List  As of 05/09/2011  2:35 PM   ASK your doctor about these medications         acetaminophen 500 MG tablet   Commonly known as: TYLENOL   Take 1,000 mg by mouth every 6 (six) hours as needed.      AMBIEN 10 MG tablet   Generic drug: zolpidem   Take 10 mg by mouth at bedtime as needed. For sleep      ASPIRIN CHILDRENS 81 MG chewable tablet   Generic drug: aspirin   Chew 81 mg by mouth daily.      B-D LOGIC BLOOD GLUCOSE W/DEVICE Kit   by Does not apply route.      cefdinir 300 MG capsule   Commonly known as: OMNICEF   Take 300 mg by mouth 2 (two) times daily.      DEXILANT 60 MG capsule   Generic drug: dexlansoprazole   Take 60 mg by mouth daily.      EFFEXOR 50 MG tablet   Generic drug: venlafaxine   Take 50 mg by mouth 2 (two) times daily.      fluticasone 50 MCG/ACT nasal spray   Commonly known as: FLONASE   Place 2 sprays into the nose daily.      GLUCOSAMINE 1500 COMPLEX PO   Take 2 tablets by mouth.      glucose blood test strip   1 each by Other route as needed. Use as instructed      HUMALOG 100 UNIT/ML injection   Generic drug: insulin lispro   by Per Tube route continuous. Pt.  Uses insulin via insulin pump .      Insulin Infusion Pump Devi   by Does not apply route.      Insulin Infusion Pump Kit   by Does not apply route.      Insulin Infusion Pump Supplies Misc   by Does not apply route.      INSULIN PUMP SYRINGE RESERVOIR Misc   by Does not apply route.      Insulin Pump Record Book Misc   by Does not  apply route.      KP FERROUS SULFATE 325 (65 FE) MG tablet   Generic drug: ferrous sulfate   Take 325 mg by mouth 2 (two) times daily.      Lancets Misc   by Does not apply route.      levocetirizine 5 MG tablet   Commonly known as: XYZAL   Take 2.5 mg by mouth every evening.      levothyroxine 200 MCG tablet   Commonly known as: SYNTHROID, LEVOTHROID   Take 200 mcg by mouth.      meclizine 25 MG tablet   Commonly known as: ANTIVERT   Take 12.5 mg by mouth 3 (three) times daily as needed. For vertigo      niacin 750 MG CR tablet   Commonly known as: NIASPAN   Take 1,500 mg by mouth at bedtime.      omega-3 acid ethyl esters 1 G capsule   Commonly known  as: LOVAZA   Take 2 g by mouth 2 (two) times daily.      simvastatin 40 MG tablet   Commonly known as: ZOCOR   Take 40 mg by mouth at bedtime.      testosterone 50 MG/5GM Gel   Commonly known as: ANDROGEL   Place 5 g onto the skin daily.             Disposition and Follow-up: Pt is medically stable and ready for discharge to home. To follow up with pcp in 5-7 days for bmet to evaluate potassium  Consults:  None  Physical exam: General appearance: Moderately built and nourished male patient who is sitting up in recliner visiting with family.  Head: Nontraumatic and normocephalic.  Eyes: Pupils equally reacting to light and accommodation. Bilateral immature cataracts.  Ears: Normal. External auditory canal is clear and tympanic membranes are bright without any acute findings.  Nose: No acute findings. No sinus tenderness.  Throat: Mucosa is slightly dry. No oral thrush or any other acute findings.  Neck: Supple. No JVD or carotid bruit.  Lymph nodes: No lymphadenopathy.  Resp: Normal effort. Breath sounds clear to auscultation. No increased work of breathing. No wheeze Cardio: First and second heart sounds heard, regular rate and rythm. No murmurs or JVD or gallop or pedal edema.  GI: Non distended. Soft and  nontender. No organomegaly or masses appreciated. Normal bowel sounds heard.  Extremities: symmetric 5/5 power.  Skin: No other acute findings.  Neurologic: Alert and oriented. No focal neurological   Significant Diagnostic Studies:  No results found.  Labs Reviewed  BASIC METABOLIC PANEL - Abnormal; Notable for the following:    Sodium 130 (*)    Potassium >7.5 (*)    CO2 17 (*)    Glucose, Bld 273 (*)    BUN 43 (*)    Creatinine, Ser 1.54 (*)    GFR calc non Af Amer 54 (*)    GFR calc Af Amer 63 (*)    All other components within normal limits  CBC - Abnormal; Notable for the following:    RBC 3.71 (*)    Hemoglobin 11.1 (*)    HCT 32.6 (*)    Platelets 76 (*)    All other components within normal limits  DIFFERENTIAL - Abnormal; Notable for the following:    Neutrophils Relative 35 (*)    Lymphocytes Relative 50 (*)    Eosinophils Relative 7 (*)    Neutro Abs 1.6 (*)    All other components within normal limits  URINALYSIS, ROUTINE W REFLEX MICROSCOPIC - Abnormal; Notable for the following:    Glucose, UA 250 (*)    Ketones, ur 15 (*)    Protein, ur 30 (*)    All other components within normal limits  GLUCOSE, CAPILLARY - Abnormal; Notable for the following:    Glucose-Capillary 264 (*)    All other components within normal limits  BLOOD GAS, VENOUS - Abnormal; Notable for the following:    pCO2, Ven 37.9 (*)    pO2, Ven 26.3 (*)    Bicarbonate 17.4 (*)    Acid-base deficit 8.2 (*)    All other components within normal limits  POCT I-STAT, CHEM 8 - Abnormal; Notable for the following:    Potassium 5.8 (*)    Chloride 116 (*)    BUN 43 (*)    Creatinine, Ser 1.60 (*)    Glucose, Bld 128 (*)    Hemoglobin 11.2 (*)  HCT 33.0 (*)    All other components within normal limits  GLUCOSE, CAPILLARY - Abnormal; Notable for the following:    Glucose-Capillary 100 (*)    All other components within normal limits  BASIC METABOLIC PANEL - Abnormal; Notable for the  following:    Potassium 5.8 (*)    BUN 38 (*)    Creatinine, Ser 1.46 (*)    GFR calc non Af Amer 58 (*)    GFR calc Af Amer 67 (*)    All other components within normal limits  BASIC METABOLIC PANEL - Abnormal; Notable for the following:    Sodium 134 (*)    Potassium 6.7 (*)    Glucose, Bld 227 (*)    BUN 34 (*)    Creatinine, Ser 1.37 (*)    GFR calc non Af Amer 62 (*)    GFR calc Af Amer 72 (*)    All other components within normal limits  CBC - Abnormal; Notable for the following:    RBC 3.09 (*)    Hemoglobin 9.3 (*)    HCT 26.8 (*)    Platelets 59 (*) CONSISTENT WITH PREVIOUS RESULT   All other components within normal limits  GLUCOSE, CAPILLARY - Abnormal; Notable for the following:    Glucose-Capillary 167 (*)    All other components within normal limits  GLUCOSE, CAPILLARY - Abnormal; Notable for the following:    Glucose-Capillary 43 (*)    All other components within normal limits  GLUCOSE, CAPILLARY - Abnormal; Notable for the following:    Glucose-Capillary 56 (*)    All other components within normal limits  GLUCOSE, CAPILLARY - Abnormal; Notable for the following:    Glucose-Capillary 57 (*)    All other components within normal limits  GLUCOSE, CAPILLARY - Abnormal; Notable for the following:    Glucose-Capillary 179 (*)    All other components within normal limits  GLUCOSE, CAPILLARY - Abnormal; Notable for the following:    Glucose-Capillary 262 (*)    All other components within normal limits  GLUCOSE, CAPILLARY - Abnormal; Notable for the following:    Glucose-Capillary 280 (*)    All other components within normal limits  BASIC METABOLIC PANEL - Abnormal; Notable for the following:    Potassium 5.3 (*)    Glucose, Bld 239 (*)    BUN 29 (*)    GFR calc non Af Amer 68 (*)    GFR calc Af Amer 78 (*)    All other components within normal limits  URINE MICROSCOPIC-ADD ON  GLUCOSE, CAPILLARY        No results found.     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  He indicated that he been fighting a sinus infection for approximately 2 weeks. He initially completed 10 days of oral Augmentin treatment but continued to have bitemporal intermittent pain. He was seen again by his primary care physician 4 days back and was switched to oral Omnicef and Flonase nasal sprays. At that time he also had pain in both the ears, dizziness but no vertigo and felt like he might  pass out. He denied any fevers or chills or nasal stuffiness and postnasal drip. On 05/07/11 his headache was quite severe. Usually he controls his blood sugars in the 100 mg/dL range but over the last couple of days his blood sugars have ranged in the 300-3 50 mg/dL. The morning of 05/08/11 he noticed generalized weakness and cramping in his legs which he associated with hyperkalemia episodes that he's had in the past. He thereby presented to the emergency department where his potassium was noted to be 7.3. He was bolused with a liter of normal saline, received 10 units of regular insulin and a dose of Kayexalate and the hospitalist service as requested to admit for further evaluation and management. He had an episode of nausea but no vomiting. He indicated that his leg cramps have resolved. No further earaches or dizziness. His appetite has been chronically poor sense late last year. Since his last admission he has lost 7 pounds of weight. This however is less severe drop compared to last year when he lost 40 pounds. He had EGD and colonoscopy by Dr. Collene Mares which apparently showed  Candida esophagitis and was treated. He had diarrhea then which has since resolved. He has not been on ACE inhibitors or ARB since July secondary to hyperkalemia issues.    Hospital Course:   Active Problems:  Hyperkalemia  Chronic kidney disease, stage 3, mod decreased GFR  Anemia  DM (diabetes mellitus), type 1, uncontrolled  Acute on chronic renal failure  Hypothyroid  Dehydration  1. Uncontrolled type 1 diabetes mellitus: Probably precipitated by recent episode of sinusitis. Pt admitted to tele. Received IV fluids, and session with inpatient diabetes coordinator. Patient indicates that his last A1c in December 2012 was 6.1. At time of discharge CBG's range 89-187. Will need OP follow up with PCP in 5-7days. 2. Mild dehydration: likely related to #1. Pt received vigorous  IV fluids. At time of discharge eurovolemic. 3. Hyperkalemia: Possibly secondary to acute on chronic kidney disease from dehydration. Other differential diagnosis is type IV renal tubular acidosis. Improved after IV fluids, insulin and a dose of Kayexalate x2. At time of discharge potasium 5.2 and CBG's controlled. Pt to see PCP 5-7 days for bmet to evaluate potassium level. 4. Mild acute on chronic kidney disease: Patient's baseline creatinine is 1.5. Pt. Was  hydrated with IV fluids. At time of discharge creatinine 1.2. 5. Anemia: Possibly secondary to chronic kidney disease. Stable. 6. Thrombocytopenia: Unclear etiology but is chronic. At time of discharge platelets 137 which seem to be baseline.  7. Hypothyroidism: Continue home dose of Synthroid. 8. Recent episode of acute sinusitis: Continue home antibiotics. 9. Dyslipidemia: Continue home regimen Time spent on Discharge: 76minutes  SignedRadene Gunning 05/09/2011, 2:35 PM

## 2011-05-09 NOTE — ED Provider Notes (Signed)
Patient presents with hyperglycemia and hyperkalemia.  IV ns and insulin given.  Patient to be admitted.  Shaune Pollack, MD 05/09/11 [phone]

## 2011-05-09 NOTE — Discharge Summary (Signed)
Patient seen and examined. His repeat potassium level this morning was due to hemolysis and repeat potassium level was actually lower than it had been. Following IV hydration during the day which further dropped his potassium lower and normalize his renal function, he is felt medically able to be discharged. He will return back to work on Sunday. He is cleared to return home. Patient was seen, examined and discussed with my nurse practitioner. Agree with above note.

## 2011-05-27 ENCOUNTER — Encounter (HOSPITAL_COMMUNITY)
Admission: RE | Admit: 2011-05-27 | Discharge: 2011-05-27 | Disposition: A | Discharge status: Home / Self Care | Payer: BC Managed Care – PPO | Source: Ambulatory Visit | Attending: Nephrology | Admitting: Nephrology

## 2011-05-27 DIAGNOSIS — D649 Anemia, unspecified: Secondary | ICD-10-CM | POA: Insufficient documentation

## 2011-05-27 LAB — POCT HEMOGLOBIN-HEMACUE: Hemoglobin: 9.1 g/dL — ABNORMAL LOW (ref 13.0–17.0)

## 2011-05-27 LAB — FERRITIN: Ferritin: 1118 ng/mL — ABNORMAL HIGH (ref 22–322)

## 2011-05-27 MED ORDER — EPOETIN ALFA 10000 UNIT/ML IJ SOLN
10000.0000 [IU] | INTRAMUSCULAR | Status: DC
  Administered 2011-05-27: 10000 [IU] via SUBCUTANEOUS
  Filled 2011-05-27: qty 1

## 2011-06-26 ENCOUNTER — Emergency Department (HOSPITAL_COMMUNITY): Payer: BC Managed Care – PPO

## 2011-06-26 ENCOUNTER — Inpatient Hospital Stay (HOSPITAL_COMMUNITY)
Admission: EM | Admit: 2011-06-26 | Discharge: 2011-06-27 | DRG: 316 | Disposition: A | Discharge status: Home / Self Care | Payer: BC Managed Care – PPO | Source: Ambulatory Visit | Attending: Pulmonary Disease | Admitting: Pulmonary Disease

## 2011-06-26 ENCOUNTER — Encounter (HOSPITAL_COMMUNITY): Payer: Self-pay | Admitting: Nephrology

## 2011-06-26 DIAGNOSIS — E872 Acidosis, unspecified: Secondary | ICD-10-CM | POA: Diagnosis present

## 2011-06-26 DIAGNOSIS — N183 Chronic kidney disease, stage 3 unspecified: Secondary | ICD-10-CM | POA: Diagnosis present

## 2011-06-26 DIAGNOSIS — E1065 Type 1 diabetes mellitus with hyperglycemia: Secondary | ICD-10-CM

## 2011-06-26 DIAGNOSIS — N189 Chronic kidney disease, unspecified: Secondary | ICD-10-CM

## 2011-06-26 DIAGNOSIS — D649 Anemia, unspecified: Secondary | ICD-10-CM | POA: Diagnosis present

## 2011-06-26 DIAGNOSIS — E785 Hyperlipidemia, unspecified: Secondary | ICD-10-CM | POA: Diagnosis present

## 2011-06-26 DIAGNOSIS — IMO0002 Reserved for concepts with insufficient information to code with codable children: Secondary | ICD-10-CM

## 2011-06-26 DIAGNOSIS — J329 Chronic sinusitis, unspecified: Secondary | ICD-10-CM | POA: Diagnosis present

## 2011-06-26 DIAGNOSIS — R252 Cramp and spasm: Secondary | ICD-10-CM | POA: Diagnosis present

## 2011-06-26 DIAGNOSIS — Z9641 Presence of insulin pump (external) (internal): Secondary | ICD-10-CM

## 2011-06-26 DIAGNOSIS — E860 Dehydration: Secondary | ICD-10-CM

## 2011-06-26 DIAGNOSIS — Z79899 Other long term (current) drug therapy: Secondary | ICD-10-CM

## 2011-06-26 DIAGNOSIS — Z794 Long term (current) use of insulin: Secondary | ICD-10-CM

## 2011-06-26 DIAGNOSIS — E875 Hyperkalemia: Secondary | ICD-10-CM | POA: Diagnosis present

## 2011-06-26 DIAGNOSIS — N179 Acute kidney failure, unspecified: Principal | ICD-10-CM | POA: Diagnosis present

## 2011-06-26 DIAGNOSIS — E109 Type 1 diabetes mellitus without complications: Secondary | ICD-10-CM | POA: Diagnosis present

## 2011-06-26 DIAGNOSIS — E861 Hypovolemia: Secondary | ICD-10-CM | POA: Diagnosis present

## 2011-06-26 DIAGNOSIS — T3795XA Adverse effect of unspecified systemic anti-infective and antiparasitic, initial encounter: Secondary | ICD-10-CM | POA: Diagnosis present

## 2011-06-26 DIAGNOSIS — D509 Iron deficiency anemia, unspecified: Secondary | ICD-10-CM | POA: Insufficient documentation

## 2011-06-26 DIAGNOSIS — E039 Hypothyroidism, unspecified: Secondary | ICD-10-CM

## 2011-06-26 HISTORY — DX: Hyperkalemia: E87.5

## 2011-06-26 LAB — GLUCOSE, CAPILLARY
Glucose-Capillary: 161 mg/dL — ABNORMAL HIGH (ref 70–99)
Glucose-Capillary: 144 mg/dL — ABNORMAL HIGH (ref 70–99)
Glucose-Capillary: 73 mg/dL (ref 70–99)

## 2011-06-26 LAB — DIFFERENTIAL
Basophils Relative: 0 % (ref 0–1)
Lymphocytes Relative: 26 % (ref 12–46)
Lymphs Abs: 2 10*3/uL (ref 0.7–4.0)
Monocytes Absolute: 0.5 10*3/uL (ref 0.1–1.0)
Monocytes Relative: 7 % (ref 3–12)
Neutro Abs: 4.3 10*3/uL (ref 1.7–7.7)
Neutrophils Relative %: 55 % (ref 43–77)

## 2011-06-26 LAB — RENAL FUNCTION PANEL
Calcium: 9 mg/dL (ref 8.4–10.5)
Calcium: 8.5 mg/dL (ref 8.4–10.5)
GFR calc Af Amer: 49 mL/min — ABNORMAL LOW (ref >90)
GFR calc Af Amer: 52 mL/min — ABNORMAL LOW (ref >90)
Glucose, Bld: 163 mg/dL — ABNORMAL HIGH (ref 70–99)
Glucose, Bld: 236 mg/dL — ABNORMAL HIGH (ref 70–99)
Phosphorus: 2.8 mg/dL (ref 2.3–4.6)
Phosphorus: 3.4 mg/dL (ref 2.3–4.6)
Sodium: 141 mEq/L (ref 135–145)
Sodium: 138 mEq/L (ref 135–145)

## 2011-06-26 LAB — CARDIAC PANEL(CRET KIN+CKTOT+MB+TROPI)
CK, MB: 10.3 ng/mL (ref 0.3–4.0)
CK, MB: 8.8 ng/mL (ref 0.3–4.0)
Total CK: 126 U/L (ref 7–232)
Troponin I: <0.3 ng/mL (ref <0.30)

## 2011-06-26 LAB — CBC
HCT: 31.8 % — ABNORMAL LOW (ref 39.0–52.0)
Hemoglobin: 10.8 g/dL — ABNORMAL LOW (ref 13.0–17.0)
RBC: 3.65 MIL/uL — ABNORMAL LOW (ref 4.22–5.81)
WBC: 7.8 10*3/uL (ref 4.0–10.5)

## 2011-06-26 LAB — POCT I-STAT, CHEM 8
Chloride: 115 mEq/L — ABNORMAL HIGH (ref 96–112)
HCT: 33 % — ABNORMAL LOW (ref 39.0–52.0)
Potassium: 8.9 mEq/L (ref 3.5–5.1)
Sodium: 134 mEq/L — ABNORMAL LOW (ref 135–145)

## 2011-06-26 LAB — COMPREHENSIVE METABOLIC PANEL
BUN: 47 mg/dL — ABNORMAL HIGH (ref 6–23)
Calcium: 9.1 mg/dL (ref 8.4–10.5)
GFR calc Af Amer: 44 mL/min — ABNORMAL LOW (ref >90)
Glucose, Bld: 309 mg/dL — ABNORMAL HIGH (ref 70–99)
Total Protein: 7.5 g/dL (ref 6.0–8.3)

## 2011-06-26 LAB — BASIC METABOLIC PANEL
BUN: 38 mg/dL — ABNORMAL HIGH (ref 6–23)
CO2: 23 mEq/L (ref 19–32)
Chloride: 108 mEq/L (ref 96–112)
Creatinine, Ser: 1.8 mg/dL — ABNORMAL HIGH (ref 0.50–1.35)
GFR calc Af Amer: 52 mL/min — ABNORMAL LOW (ref >90)

## 2011-06-26 LAB — TROPONIN I: Troponin I: <0.3 ng/mL (ref <0.30)

## 2011-06-26 LAB — CK TOTAL AND CKMB (NOT AT ARMC)
CK, MB: 13.4 ng/mL (ref 0.3–4.0)
Relative Index: 8.6 — ABNORMAL HIGH (ref 0.0–2.5)

## 2011-06-26 MED ORDER — MECLIZINE HCL 12.5 MG PO TABS
12.5000 mg | ORAL_TABLET | Freq: Three times a day (TID) | ORAL | Status: DC | PRN
  Filled 2011-06-26: qty 1

## 2011-06-26 MED ORDER — NIACIN ER (ANTIHYPERLIPIDEMIC) 500 MG PO TBCR
1500.0000 mg | EXTENDED_RELEASE_TABLET | Freq: Every day | ORAL | Status: DC
  Administered 2011-06-26: 1500 mg via ORAL
  Filled 2011-06-26 (×2): qty 3

## 2011-06-26 MED ORDER — INSULIN REGULAR HUMAN 100 UNIT/ML IJ SOLN: 10.0000 [IU] | Freq: Once | INTRAMUSCULAR | Status: DC

## 2011-06-26 MED ORDER — SIMVASTATIN 40 MG PO TABS
40.0000 mg | ORAL_TABLET | Freq: Every day | ORAL | Status: DC
  Administered 2011-06-26: 40 mg via ORAL
  Filled 2011-06-26 (×2): qty 1

## 2011-06-26 MED ORDER — SODIUM CHLORIDE 0.9 % IJ SOLN: 3.0000 mL | INTRAMUSCULAR | Status: DC | PRN

## 2011-06-26 MED ORDER — INSULIN ASPART 100 UNIT/ML ~~LOC~~ SOLN
SUBCUTANEOUS | Status: AC
  Filled 2011-06-26: qty 10

## 2011-06-26 MED ORDER — SODIUM POLYSTYRENE SULFONATE 15 GM/60ML PO SUSP
15.0000 g | Freq: Two times a day (BID) | ORAL | Status: DC
  Administered 2011-06-27: 15 g via ORAL
  Filled 2011-06-26 (×4): qty 60

## 2011-06-26 MED ORDER — SODIUM CHLORIDE 0.9 % IV BOLUS (SEPSIS)
500.0000 mL | Freq: Once | INTRAVENOUS | Status: AC
  Administered 2011-06-26: 1000 mL via INTRAVENOUS

## 2011-06-26 MED ORDER — INSULIN PUMP
Freq: Three times a day (TID) | SUBCUTANEOUS | Status: DC
  Filled 2011-06-26: qty 1

## 2011-06-26 MED ORDER — DEXTROSE 50 % IV SOLN
1.0000 | Freq: Once | INTRAVENOUS | Status: AC
  Administered 2011-06-26: 50 mL via INTRAVENOUS
  Filled 2011-06-26: qty 50

## 2011-06-26 MED ORDER — HEPARIN SODIUM (PORCINE) 5000 UNIT/ML IJ SOLN
5000.0000 [IU] | Freq: Three times a day (TID) | INTRAMUSCULAR | Status: DC
  Administered 2011-06-26 – 2011-06-27 (×2): 5000 [IU] via SUBCUTANEOUS
  Filled 2011-06-26 (×5): qty 1

## 2011-06-26 MED ORDER — SODIUM BICARBONATE 8.4 % IV SOLN
150.0000 meq | Freq: Once | INTRAVENOUS | Status: AC
  Administered 2011-06-26: 150 meq via INTRAVENOUS

## 2011-06-26 MED ORDER — ASPIRIN 300 MG RE SUPP
300.0000 mg | RECTAL | Status: DC
  Filled 2011-06-26: qty 1

## 2011-06-26 MED ORDER — SODIUM CHLORIDE 0.9 % IV SOLN
1.0000 g | Freq: Once | INTRAVENOUS | Status: AC
  Administered 2011-06-26: 1 g via INTRAVENOUS
  Filled 2011-06-26: qty 10

## 2011-06-26 MED ORDER — INSULIN ASPART 100 UNIT/ML IV SOLN
10.0000 [IU] | Freq: Once | INTRAVENOUS | Status: AC
  Administered 2011-06-26: 10 [IU] via INTRAVENOUS
  Filled 2011-06-26: qty 0.1

## 2011-06-26 MED ORDER — SODIUM POLYSTYRENE SULFONATE 15 GM/60ML PO SUSP
45.0000 g | Freq: Once | ORAL | Status: AC
  Administered 2011-06-26: 45 g via ORAL
  Filled 2011-06-26: qty 180

## 2011-06-26 MED ORDER — LORATADINE 10 MG PO TABS
10.0000 mg | ORAL_TABLET | Freq: Every evening | ORAL | Status: DC
  Administered 2011-06-26: 10 mg via ORAL
  Filled 2011-06-26 (×2): qty 1

## 2011-06-26 MED ORDER — ASPIRIN 81 MG PO CHEW: 324.0000 mg | CHEWABLE_TABLET | ORAL | Status: DC

## 2011-06-26 MED ORDER — PANTOPRAZOLE SODIUM 40 MG PO TBEC
40.0000 mg | DELAYED_RELEASE_TABLET | Freq: Every day | ORAL | Status: DC
  Administered 2011-06-27: 40 mg via ORAL
  Filled 2011-06-26: qty 1

## 2011-06-26 MED ORDER — SODIUM POLYSTYRENE SULFONATE 15 GM/60ML PO SUSP
15.0000 g | Freq: Four times a day (QID) | ORAL | Status: DC
  Filled 2011-06-26 (×3): qty 60

## 2011-06-26 MED ORDER — EPOETIN ALFA 10000 UNIT/ML IJ SOLN
10000.0000 [IU] | Freq: Once | INTRAMUSCULAR | Status: DC
  Filled 2011-06-26: qty 1

## 2011-06-26 MED ORDER — TESTOSTERONE 50 MG/5GM (1%) TD GEL
5.0000 g | Freq: Every day | TRANSDERMAL | Status: DC
  Administered 2011-06-27: 5 g via TRANSDERMAL
  Filled 2011-06-26: qty 5

## 2011-06-26 MED ORDER — SODIUM BICARBONATE 8.4 % IV SOLN
150.0000 meq | Freq: Once | INTRAVENOUS | Status: AC
  Administered 2011-06-26: 150 meq via INTRAVENOUS
  Filled 2011-06-26 (×2): qty 50

## 2011-06-26 MED ORDER — FERROUS SULFATE 325 (65 FE) MG PO TABS
325.0000 mg | ORAL_TABLET | Freq: Two times a day (BID) | ORAL | Status: DC
  Administered 2011-06-26 – 2011-06-27 (×2): 325 mg via ORAL
  Filled 2011-06-26 (×4): qty 1

## 2011-06-26 MED ORDER — SODIUM POLYSTYRENE SULFONATE 15 GM/60ML PO SUSP
30.0000 g | Freq: Once | ORAL | Status: AC
  Administered 2011-06-26: 30 g via ORAL
  Filled 2011-06-26: qty 120

## 2011-06-26 MED ORDER — SODIUM CHLORIDE 0.9 % IV SOLN
INTRAVENOUS | Status: DC
  Administered 2011-06-26: 14:00:00 via INTRAVENOUS

## 2011-06-26 MED ORDER — VENLAFAXINE HCL 50 MG PO TABS
50.0000 mg | ORAL_TABLET | Freq: Two times a day (BID) | ORAL | Status: DC
  Administered 2011-06-26 – 2011-06-27 (×2): 50 mg via ORAL
  Filled 2011-06-26 (×3): qty 1

## 2011-06-26 MED ORDER — ACETAMINOPHEN 500 MG PO TABS
1000.0000 mg | ORAL_TABLET | Freq: Four times a day (QID) | ORAL | Status: DC | PRN
  Filled 2011-06-26: qty 2

## 2011-06-26 MED ORDER — SODIUM CHLORIDE 0.9 % IJ SOLN: 3.0000 mL | Freq: Two times a day (BID) | INTRAMUSCULAR | Status: DC

## 2011-06-26 MED ORDER — ASPIRIN 81 MG PO CHEW
81.0000 mg | CHEWABLE_TABLET | Freq: Every day | ORAL | Status: DC
  Administered 2011-06-27: 81 mg via ORAL
  Filled 2011-06-26: qty 1

## 2011-06-26 MED ORDER — OMEGA-3-ACID ETHYL ESTERS 1 G PO CAPS
2.0000 g | ORAL_CAPSULE | Freq: Two times a day (BID) | ORAL | Status: DC
  Administered 2011-06-26 – 2011-06-27 (×2): 2 g via ORAL
  Filled 2011-06-26 (×3): qty 2

## 2011-06-26 MED ORDER — SODIUM CHLORIDE 0.9 % IV SOLN: 250.0000 mL | INTRAVENOUS | Status: DC | PRN

## 2011-06-26 MED ORDER — LEVOCETIRIZINE DIHYDROCHLORIDE 5 MG PO TABS: 2.5000 mg | ORAL_TABLET | Freq: Every evening | ORAL | Status: DC

## 2011-06-26 MED ORDER — LEVOTHYROXINE SODIUM 200 MCG PO TABS
200.0000 ug | ORAL_TABLET | Freq: Every day | ORAL | Status: DC
  Filled 2011-06-26 (×2): qty 1

## 2011-06-26 MED ORDER — SODIUM BICARBONATE 8.4 % IV SOLN
INTRAVENOUS | Status: AC
  Filled 2011-06-26: qty 50

## 2011-06-26 MED ORDER — FLUTICASONE PROPIONATE 50 MCG/ACT NA SUSP
2.0000 | Freq: Every day | NASAL | Status: DC
  Administered 2011-06-27: 2 via NASAL
  Filled 2011-06-26: qty 16

## 2011-06-26 MED ORDER — SODIUM BICARBONATE 4.2 % IV SOLN
50.0000 meq | Freq: Once | INTRAVENOUS | Status: DC
  Filled 2011-06-26 (×2): qty 100

## 2011-06-26 MED ORDER — SODIUM CHLORIDE 0.9 % IV BOLUS (SEPSIS): 1000.0000 mL | Freq: Once | INTRAVENOUS | Status: DC

## 2011-06-26 MED ORDER — SODIUM CHLORIDE 0.45 % IV SOLN: INTRAVENOUS | Status: DC

## 2011-06-26 MED ORDER — SODIUM BICARBONATE 8.4 % IV SOLN
50.0000 meq | Freq: Once | INTRAVENOUS | Status: AC
  Administered 2011-06-26: 50 meq via INTRAVENOUS
  Filled 2011-06-26: qty 50

## 2011-06-26 NOTE — ED Notes (Signed)
[id number] Expected date:<BR> Expected time:<BR> Means of arrival:<BR> Comments:<BR> hyperkalemia

## 2011-06-26 NOTE — ED Notes (Signed)
States feels better, no longer diaphoretic, w/d, c/o itching on face, neck, back. Red blotchy areas on forehead, neck and upper back. Right ear also. Drinking kayexalate with no c/o's

## 2011-06-26 NOTE — Consult Note (Signed)
[facility] - CONSULT NOTE Resident Note    Please see below for attending addendum to resident note.   Date: 06/26/2011                  Patient Name:  Patrick Brown  MRN: [id number]  DOB: 1970/02/11  Age / Sex: 41 y.o., male         PCP: Redge Gainer, MD                 Referring Physician: Dr. Titus Mould                   Reason for Consult: Hyperkalemia            History of Present Illness: Patient is a 41 y.o. male with a PMHx of Type 1 DM since age 69, CKD stage 3, multiple episodes of hyperkalemia  who presented to the [facility] with acute weakness that he describes as "near syncope" .  He states that he was feeling very weak - " not himself" for last 2- 3 days. He was feeling somewhat dizzy this morning but  left for the work and  had an episode of acute weakness where he could not get out of his car. He described it as weakness in his legs- could not maintain his balance. Denies any fall, loss of conscious, vertigo or blurry vision. Also denies any  chest pain, nausea, vomiting , abdominal pain , alteration in bladder or bowel habits Of note - he states that he was having sinus infection for last 2 weeks and was getting treated with TMP-SMZ  DS twice a day  for the last 8-9 days.  EMS was called  - checked his blood sugar that was running in 200's.He was brought to the [facility] ER and  was found to have a potassium of 8.9, worsening  renal function ( Cr- 2.10 with baseline around 1.5) and CO2 was low at 15.   ECG showed widened QRS/conduction delay.  He was transported to [facility] in case dialysis was needed and Nephrologyl was consulted for the management of his hyperkalemia.  Pt is followed for CKD by Dr. Lorrene Reid.  He is known to have a potassium excretory defect and has required both inpatient and outpt treatment for hyperkalemia in the past.  He has not had problems with chronic acidosis although he has required bicarb treatment on occasion in the past  as an adjunct to treatment for high potassiums.  His last measured CO2 in mid April was 25  Medications: Outpatient medications: Prescriptions prior to admission  Medication Sig Dispense Refill  . acetaminophen (TYLENOL) 500 MG tablet Take 1,000 mg by mouth every 6 (six) hours as needed.      Marland Kitchen aspirin (ASPIRIN CHILDRENS) 81 MG chewable tablet Chew 81 mg by mouth daily.       Marland Kitchen dexlansoprazole (DEXILANT) 60 MG capsule Take 60 mg by mouth daily.        . ferrous sulfate (KP FERROUS SULFATE) 325 (65 FE) MG tablet Take 325 mg by mouth 2 (two) times daily.        . fluticasone (FLONASE) 50 MCG/ACT nasal spray Place 2 sprays into the nose daily.      . Glucosamine-Chondroit-Vit C-Mn (GLUCOSAMINE 1500 COMPLEX PO) Take 2 tablets by mouth.      . insulin lispro (HUMALOG) 100 UNIT/ML injection Inject into the skin continuous. Pt.  Uses insulin via insulin pump .      Marland Kitchen  levocetirizine (XYZAL) 5 MG tablet Take 2.5 mg by mouth every evening.      Marland Kitchen levothyroxine (SYNTHROID, LEVOTHROID) 200 MCG tablet Take 200 mcg by mouth.        . meclizine (ANTIVERT) 25 MG tablet Take 12.5 mg by mouth 3 (three) times daily as needed. For vertigo      . niacin 500 MG tablet Take 1,500 mg by mouth daily with breakfast.      . omega-3 acid ethyl esters (LOVAZA) 1 G capsule Take 2 g by mouth 2 (two) times daily.        . simvastatin (ZOCOR) 40 MG tablet Take 40 mg by mouth at bedtime.        Marland Kitchen testosterone (ANDROGEL) 50 MG/5GM GEL Place 5 g onto the skin daily.       Marland Kitchen venlafaxine (EFFEXOR) 50 MG tablet Take 50 mg by mouth 2 (two) times daily.        Marland Kitchen zolpidem (AMBIEN) 10 MG tablet Take 10 mg by mouth at bedtime as needed. For sleep      . Blood Glucose Monitoring Suppl (B-D LOGIC BLOOD GLUCOSE) W/DEVICE KIT by Does not apply route.        Marland Kitchen glucose blood test strip 1 each by Other route as needed. Use as instructed       . Insulin Infusion Pump Supplies MISC by Does not apply route.        . Lancets MISC by Does not  apply route.        TMP-SMZ DS 1 tab BID (started about 9 days ago)  Current medications: Current Facility-Administered Medications  Medication Dose Route Frequency Provider Last Rate Last Dose  . calcium gluconate 1 g in sodium chloride 0.9 % 100 mL IVPB  1 g Intravenous Once Shaune Pollack, MD   1 g at 06/26/11 1112  . dextrose 50 % solution 50 mL  1 ampule Intravenous Once Shaune Pollack, MD   50 mL at 06/26/11 1104  . insulin aspart (novoLOG) injection 10 Units  10 Units Intravenous Once Shaune Pollack, MD   10 Units at 06/26/11 1105  . sodium bicarbonate injection 150 mEq  150 mEq Intravenous Once Shaune Pollack, MD   150 mEq at 06/26/11 1139  . sodium bicarbonate injection 150 mEq  150 mEq Intravenous Once Shaune Pollack, MD   150 mEq at 06/26/11 1218  . sodium chloride 0.9 % bolus 500 mL  500 mL Intravenous Once Shaune Pollack, MD   1,000 mL at 06/26/11 1110  . sodium polystyrene (KAYEXALATE) 15 GM/60ML suspension 45 g  45 g Oral Once Shaune Pollack, MD   45 g at 06/26/11 1134  . DISCONTD: insulin aspart (novoLOG) 100 UNIT/ML injection           . DISCONTD: insulin regular (NOVOLIN R,HUMULIN R) 100 units/mL injection 10 Units  10 Units Intravenous Once Shaune Pollack, MD        Allergies: Allergies  Allergen Reactions  . Ramipril Cough    Past Medical History: Past Medical History  Diagnosis Date  . IDDM (insulin dependent diabetes mellitus)   . Retinopathy     x2  . Proteinuria   . Dyslipidemia   . Anemia, iron deficiency On procrit  . Hematuria, microscopic 10/09    work up negative (Dr. Amalia Hailey)  . Diabetes mellitus   . Hypothyroidism   . CKD (chronic kidney disease)   . Low HDL (under 40)   .  Hyperkalemia, diminished renal excretion 06/2011 secondary to TMP/SMZ; prior secondary to  ARBS;     Known potassium excretory defect; history of recurrent hyperkalemia due to diabetic renal disease; ACE/ARB contraindicated; hyperkalemia 06/2011 secondary to TMP-SMZ   Past Surgical  History: Past Surgical History  Procedure Date  . Eye surgery 2006    x2   . Vitrectomy bilateral  . Insulin pump   . Colonoscopy   . Esophagogastroduodenoscopy    Family History: Family History  Problem Relation Age of Onset  . Diabetes type I Brother   . Prostate cancer Other    Social History:  reports that he has never smoked. He has never used smokeless tobacco. He reports that he drinks alcohol. He reports that he does not use illicit drugs.   Review of Systems: As per HPI Vital Signs: Blood pressure 102/44, pulse 89, temperature 98.1 F (36.7 C), resp. rate 16, SpO2 100.00%.  Physical Exam: General: Vital signs reviewed and noted. Well-developed, well-nourished, in no acute distress; alert, appropriate and cooperative throughout examination.  Head: Normocephalic, atraumatic.  Eyes: PERRL, EOMI, No signs of anemia or jaundice; changes from prior vitrectomies.  Nose: Mucous membranes moist, not inflammed, nonerythematous.  Throat: Oropharynx nonerythematous, no exudate appreciated.   Neck: No deformities, masses, or tenderness noted.Supple, No carotid Bruits, no JVD.  Lungs:  Normal respiratory effort. Clear to auscultation BL without crackles or wheezes.  Heart: RRR. S1 and S2 normal without gallop, murmur, or rubs.  Abdomen:  BS normoactive. Soft, Nondistended, non-tender.  No masses or organomegaly.  Extremities: No pretibial edema.  Neurologic: A&O X3, CN II - XII are grossly intact. Motor strength is 5/5 in the all 4 extremities, Sensations intact to light touch, Cerebellar signs negative.  Skin: No visible rashes, scars.    Lab results: Basic Metabolic Panel:  Lab [id number] 06/26/11 1037 06/26/11 1024  NA 141 134* 131*  K 7.1* 8.9* >7.5*  CL 112 115* 104  CO2 20 -- 15*  GLUCOSE 163* 296* 309*  BUN 44* 48* 47*  CREATININE 1.89* 2.10* 2.08*  CALCIUM 9.0 -- 9.1  MG -- -- --  PHOS 2.8 -- --    Liver Function Tests:  Lab 06/26/11 1024  AST 30    ALT 26  ALKPHOS 85  BILITOT 0.2*  PROT 7.5  ALBUMIN 3.7   CBC:  Lab 06/26/11 1037 06/26/11 1024  WBC -- 7.8  NEUTROABS -- 4.3  HGB 11.2* 10.8*  HCT 33.0* 31.8*  MCV -- 87.1  PLT -- 125*    Cardiac Enzymes:  Lab 06/26/11 1024  CKTOTAL 156  CKMB 13.4*  CKMBINDEX --  TROPONINI <0.30     CBG:  Lab 06/26/11 1333  GLUCAP 161*   Iron/TIBC/Ferritin    Component Value Date/Time   IRON 76 05/27/2011 1247   TIBC 200* 05/27/2011 1247   FERRITIN 1118* 05/27/2011 1247    Imaging: Dg Chest Port 1 View  06/26/2011  *RADIOLOGY REPORT*  Clinical Data: Hyperkaliemia.  Diabetes.  PORTABLE CHEST - 1 VIEW  Comparison: 06/25/2004.  Findings: 1031 hours. There are low lung volumes.  The lungs are clear.  There is no pleural effusion or pneumothorax.  The heart size and mediastinal contours are normal.  IMPRESSION: No active cardiopulmonary process.  Original Report Authenticated By: Vivia Ewing, M.D.    EKG- normal sinus rhythm, ST elevation in V1 and V2,peaked T waves  Assessment & Plan: Pt is a 41 y.o. yo male with a PMHX of Type 1  DM, CKD stage 3  multiple episodes of hyperkalemia was  transferred from [place] [facility] to [facility] for management of his hyperkalemia and acute on chronic renal failure.   1. Hyperkalemia: likely secondary to TMP-SMZ in the setting of his CKD from his diabetes with known K excretory defect.. Patient presents with K-- 8.9 , He was treated in the ER with Ca gluconate, bicarb, 1 amp of D-50 with insulin, kayexalate. His EKG shows tall tented T waves and wide QRS. He has had several admissions in the last year for hyperkalemia( last one in 04/13).  His repeat K continues to be 7.1 but is clearly improving - He was given one more dose of bicarbonate. - Will repeat Kayexelate at 4 PM - D/C TMP-SMZ - Serial EKG's - Do not see need for dialysis at this time as his potassium is responding to conservative management - Repeat BMET in PM.  2. Acute on  chronic Renal failure stage 3: His baseline Cr ~1.5. He presented with Cr of 2.10. This is likely secondary to medication side effects( TMP-SMZ). He had an anion gap of 12 at presentation with bicarbonate of 15 which has resolved now.he is on oral bicarbonate as outpatient. His Cr has started trending down 2.1> 1.89. - Continue to monitor. - Continue IV fluids at current rate (100/hour)  4. Type 1 DM: AIC - 7.1 in Nov 2012. On insulin pump. Not a candidate for ACE/ARB because of his issues with hyperkalemia.  5. Anemia: His baseline Hb-9-10.Likely anemia of chronic disease with iron -76, saturation -38%, ferritin of 1118, TIBC-200 from 04/13.     -On iron supplementation and Procrit ( 10,000 mg monthly). He is due for his next one (last dose was 05/27/11)     - Will go ahead and redose 10,000 units while here    6. Hypothyroidism: On synthroid.  - Check TSH  7. HLD: Cont statin and niacin.  7. Thrombocytopenia: Platelets stable. Continue to monitor CBC.  8. Dispo; Continue to observe in ICU  Patient history and plan of care reviewed with attending, Dr. Lorrene Reid.   Janetta Hora, Internal Medicine Resident 06/26/2011, 1:44 PM I have seen and examined this patient and agree with plan with highlighted additions.  Patient with CKD 3 with known potassium excretory defect presents with life-threatening hyperkalemia that is already responding to conservative treatment.  Do not believe dialysis will be necessary.  Plans as outlined above, with potassium recheck at [place] B,MD 06/26/2011 3:59 PM

## 2011-06-26 NOTE — Progress Notes (Signed)
CRITICAL VALUE ALERT  Critical value received:  K 7.1     Date of notification:  06/26/11  Time of notification:  1508  Critical value read back:yes  Nurse who received alert:  Vicente Masson RN   MD notified (1st page):  Lorrene Reid  Time of first page:  1510  MD notified (2nd page):  Time of second page:  Responding MD:  Lorrene Reid  Time MD responded:  1710

## 2011-06-26 NOTE — H&P (Signed)
Name: Patrick Brown MRN: [id number] DOB: [birth year]    LOS: 0  Referring Provider:  EDP Reason for Referral:  Weakness, hyperkalemia   PULMONARY / CRITICAL CARE MEDICINE  HPI:  41yo male with hx DM, anemia, CKD followed by Dr. Lorrene Reid presented 5/22 to [facility] ER with acute weakness.  He had been in his usual state of health aside from a sinus infection for which he was being treated with Septra.  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  On arrival to [facility] ED he was found to have significant hyperkalemia with K>7.5 and was tx to [facility] for ICU admit and renal f/u.   Past Medical History  Diagnosis Date  . IDDM (insulin dependent diabetes mellitus)   . Retinopathy     x2  . Proteinuria   . Dyslipidemia   . Anemia, iron deficiency   . Hematuria, microscopic 10/09    work up negative (Dr. Amalia Hailey)  . Diabetes mellitus   . Hypothyroidism   . CKD (chronic kidney disease)   . Low HDL (under 40)    Past Surgical History  Procedure Date  . Eye surgery 2006    x2   . Vitrectomy bilateral  . Insulin pump   . Colonoscopy   . Esophagogastroduodenoscopy    Prior to Admission medications   Medication Sig Start Date End Date Taking? Authorizing Provider  acetaminophen (TYLENOL) 500 MG tablet Take 1,000 mg by mouth every 6 (six) hours as needed.   Yes Historical Provider, MD  aspirin (ASPIRIN CHILDRENS) 81 MG chewable tablet Chew 81 mg by mouth daily.    Yes Historical Provider, MD  dexlansoprazole (DEXILANT) 60 MG capsule Take 60 mg by mouth daily.     Yes Historical Provider, MD  ferrous sulfate (KP FERROUS SULFATE) 325 (65 FE) MG tablet Take 325 mg by mouth 2 (two) times daily.     Yes Historical Provider, MD  fluticasone (FLONASE) 50 MCG/ACT nasal spray Place 2 sprays into the nose daily.   Yes Historical Provider, MD  Glucosamine-Chondroit-Vit C-Mn (GLUCOSAMINE 1500 COMPLEX PO) Take 2 tablets by mouth.   Yes  Historical Provider, MD  insulin lispro (HUMALOG) 100 UNIT/ML injection Inject into the skin continuous. Pt.  Uses insulin via insulin pump .   Yes Historical Provider, MD  levocetirizine (XYZAL) 5 MG tablet Take 2.5 mg by mouth every evening.   Yes Historical Provider, MD  levothyroxine (SYNTHROID, LEVOTHROID) 200 MCG tablet Take 200 mcg by mouth.     Yes Historical Provider, MD  meclizine (ANTIVERT) 25 MG tablet Take 12.5 mg by mouth 3 (three) times daily as needed. For vertigo   Yes Historical Provider, MD  niacin 500 MG tablet Take 1,500 mg by mouth daily with breakfast.   Yes Historical Provider, MD  omega-3 acid ethyl esters (LOVAZA) 1 G capsule Take 2 g by mouth 2 (two) times daily.     Yes Historical Provider, MD  simvastatin (ZOCOR) 40 MG tablet Take 40 mg by mouth at bedtime.     Yes Historical Provider, MD  testosterone (ANDROGEL) 50 MG/5GM GEL Place 5 g onto the skin daily.    Yes Historical Provider, MD  venlafaxine (EFFEXOR) 50 MG tablet Take 50 mg by mouth 2 (two) times daily.     Yes Historical Provider, MD  zolpidem (AMBIEN) 10 MG tablet Take 10 mg by mouth at bedtime as needed. For sleep  Yes Historical Provider, MD  Blood Glucose Monitoring Suppl (B-D LOGIC BLOOD GLUCOSE) W/DEVICE KIT by Does not apply route.      Historical Provider, MD  glucose blood test strip 1 each by Other route as needed. Use as instructed     Historical Provider, MD  Insulin Infusion Pump Supplies MISC by Does not apply route.      Historical Provider, MD  Lancets MISC by Does not apply route.      Historical Provider, MD   Allergies Allergies  Allergen Reactions  . Ramipril Cough   Family History Family History  Problem Relation Age of Onset  . Diabetes type I Brother   . Prostate cancer Other    Social History  reports that he has never smoked. He has never used smokeless tobacco. He reports that he drinks alcohol. He reports that he does not use illicit drugs.  Review Of Systems:  See HPI.   All other systems reviewed and were neg.   Vital Signs: Temp:  [97.8 F (36.6 C)-98.1 F (36.7 C)] 98.1 F (36.7 C) (05/22 1228) Pulse Rate:  [75-89] 89  (05/22 1228) Resp:  [16-22] 16  (05/22 1228) BP: (102-123)/(44-66) 102/44 mmHg (05/22 1228) SpO2:  [100 %] 100 % (05/22 1228) On RA   Physical Examination: General: pleasant male, NAD  Neuro: awake, alert, appropriate, MAE  CV: s1s2 rrr, no m/r/g PULM: resps even non labored on RA, CTA GI: abd soft, +bs Extremities: warm and dry no edema   Active Problems:  Hyperkalemia  Chronic kidney disease, stage 3, mod decreased GFR  DM (diabetes mellitus), type 1, uncontrolled  ASSESSMENT AND PLAN  PULMONARY  A:  No active problems  P:   -  No intervention required  CARDIOVASCULAR  Lab 06/26/11 1024  TROPONINI <0.30  LATICACIDVEN --  PROBNP --   ECG:  NSR Lines: NA  A: No active problems P:  Monitor EKG with hyperkalemia   RENAL  Lab 06/26/11 1412 06/26/11 1037 06/26/11 1024  NA 141 134* 131*  K 7.1* 8.9* --  CL 112 115* 104  CO2 20 -- 15*  BUN 44* 48* 47*  CREATININE 1.89* 2.10* 2.08*  CALCIUM 9.0 -- 9.1  MG -- -- --  PHOS 2.8 -- --   Intake/Output      05/21 0701 - 05/22 0700 05/22 0701 - 05/23 0700   I.V.  2000   Total Intake  2000   Net  +2000         A:  Hyperkalemia likely secondary to septra on the background of CKD. Acute on chronic renal failure.  Hypovolemia, acidosis. P:   Renal consult NS 100 mL/h Kayexalate  Recheck BMP  GASTROINTESTINAL  Lab 06/26/11 1024  AST 30  ALT 26  ALKPHOS 85  BILITOT 0.2*  PROT 7.5  ALBUMIN 3.7   A:  No acute issues  P:   Renal diet  HEMATOLOGIC  Lab 06/26/11 1037 06/26/11 1024  HGB 11.2* 10.8*  HCT 33.0* 31.8*  PLT -- 125*  INR -- --  APTT -- --   A:  Chronic anemia  P:  Cont iron F/u cbc   INFECTIOUS  Lab 06/26/11 1024  WBC 7.8  PROCALCITON --   Cultures: none Antibiotics: none A: Recent sinusitis.  P:   S/p 9 days  septra as outpt  Hold on further abx for now   ENDOCRINE  Lab 06/26/11 1333  GLUCAP 161*   A:  DM. Hypothyroidism. P:  Ok for pt to cont insulin pump for now Monitor CBG Synthroid Testosterone  NEUROLOGIC  A:  Muscle weakness - resolved.  P:   Neuro checks  BEST PRACTICE / DISPOSITION - Level of Care:  ICU - Primary Service:  PCCM - Consultants:  Renal Lorrene Reid - Code Status:  Full - Diet:  Carb mod - DVT Px:  heparin - GI Px:  Protonix - Skin Integrity:  intact  WHITEHEART,KATHRYN, NP 06/26/2011  2:24 PM Pager: [phone]  Care during the described time interval was provided by me and/or other providers on the critical care team. I have reviewed this patient's available data, including medical history, events of note, physical examination and test results as part of my evaluation.  Penne Lash, M.D., F.C.C.P. Pulmonary and [place] Cell: [phone] Pager: [phone]

## 2011-06-26 NOTE — ED Notes (Signed)
C/O weakness x 2 days, worse today. 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

## 2011-06-26 NOTE — ED Provider Notes (Signed)
History     CSN: [id number]  Arrival date & time 06/26/11  [id number]   First MD Initiated Contact with Patient 06/26/11 1002      Chief Complaint  Patient presents with  . Weakness    (Consider location/radiation/quality/duration/timing/severity/associated sxs/prior treatment) HPI  41 y.o. Male h.o. iddm since age 25 with renal insufficiency with multiple episodes of hyperkalemia now with cramps in lower extremities and states "not feeling good."  Felt weak and continues to feel lightheaded and took antivert.  Felt lightheaded yesterday and worse today.  Cramping in bilateral calves yesterday and mild cramping bilateral calves now.  Had sinusitis a week ago and on septra- nasal congestion no fever.  BS 169, ems 276.  NO chest pain, abdominal pain, or dyspnea.  Followed by Dr. Laurance Flatten in [place], Renal Dr. Lorrene Reid,  Seen by [facility] cardiology-denies cardiac testing.  Dr. Laurance Flatten did gxt within 6 months and report normal. Anemia thought to be secondary to kidney disease.   Past Medical History  Diagnosis Date  . IDDM (insulin dependent diabetes mellitus)   . Retinopathy     x2  . Proteinuria   . Dyslipidemia   . Anemia, iron deficiency   . Hematuria, microscopic 10/09    work up negative (Dr. Amalia Hailey)  . Diabetes mellitus   . Hypothyroidism   . CKD (chronic kidney disease)   . Low HDL (under 40)     Past Surgical History  Procedure Date  . Eye surgery 2006    x2   . Vitrectomy bilateral  . Insulin pump   . Colonoscopy   . Esophagogastroduodenoscopy     Family History  Problem Relation Age of Onset  . Diabetes type I Brother   . Prostate cancer Other     History  Substance Use Topics  . Smoking status: Never Smoker   . Smokeless tobacco: Never Used  . Alcohol Use: Yes     very rarely      Review of Systems  Constitutional: Positive for fatigue.  Respiratory: Negative for choking and chest tightness.   Neurological: Positive for light-headedness.  All other systems  reviewed and are negative.    Allergies  Ramipril  Home Medications   Current Outpatient Rx  Name Route Sig Dispense Refill  . ACETAMINOPHEN 500 MG PO TABS Oral Take 1,000 mg by mouth every 6 (six) hours as needed.    . ASPIRIN 81 MG PO CHEW Oral Chew 81 mg by mouth daily.     . BD LOGIC BLOOD GLUCOSE MONITOR W/DEVICE KIT Does not apply by Does not apply route.      Marland Kitchen CEFDINIR 300 MG PO CAPS Oral Take 300 mg by mouth 2 (two) times daily.    . DEXLANSOPRAZOLE 60 MG PO CPDR Oral Take 60 mg by mouth daily.      Marland Kitchen FERROUS SULFATE 325 (65 FE) MG PO TABS Oral Take 325 mg by mouth 2 (two) times daily.      Marland Kitchen FLUTICASONE PROPIONATE 50 MCG/ACT NA SUSP Nasal Place 2 sprays into the nose daily.    Marland Kitchen GLUCOSAMINE 1500 COMPLEX PO Oral Take 2 tablets by mouth.    Marland Kitchen GLUCOSE BLOOD VI STRP Other 1 each by Other route as needed. Use as instructed     . INSULIN PUMP RECORD BOOK MISC Does not apply by Does not apply route.      . INSULIN INFUSION PUMP DEVI Does not apply by Does not apply route.      Marland Kitchen  INSULIN INFUSION PUMP KIT Does not apply by Does not apply route.      . INSULIN PUMP SYRINGE RESERVOIR MISC Does not apply by Does not apply route.      . INSULIN INFUSION PUMP SUPPLIES MISC Does not apply by Does not apply route.      . INSULIN LISPRO (HUMAN) 100 UNIT/ML [place] SOLN Per Tube by Per Tube route continuous. Pt.  Uses insulin via insulin pump .     Marland Kitchen LANCETS MISC Does not apply by Does not apply route.      Marland Kitchen LEVOCETIRIZINE DIHYDROCHLORIDE 5 MG PO TABS Oral Take 2.5 mg by mouth every evening.    Marland Kitchen LEVOTHYROXINE SODIUM 200 MCG PO TABS Oral Take 200 mcg by mouth.      Marland Kitchen LORATADINE 10 MG PO TABS Oral Take 1 tablet (10 mg total) by mouth at bedtime.    Marland Kitchen MECLIZINE HCL 25 MG PO TABS Oral Take 12.5 mg by mouth 3 (three) times daily as needed. For vertigo    . NIACIN ER (ANTIHYPERLIPIDEMIC) 750 MG PO TBCR Oral Take 1,500 mg by mouth at bedtime.     . OMEGA-3-ACID ETHYL ESTERS 1 G PO CAPS Oral Take 2 g  by mouth 2 (two) times daily.      Marland Kitchen SIMVASTATIN 40 MG PO TABS Oral Take 40 mg by mouth at bedtime.      . TESTOSTERONE 50 MG/5GM TD GEL Transdermal Place 5 g onto the skin daily.      . VENLAFAXINE HCL 50 MG PO TABS Oral Take 50 mg by mouth 2 (two) times daily.      Marland Kitchen ZOLPIDEM TARTRATE 10 MG PO TABS Oral Take 10 mg by mouth at bedtime as needed. For sleep      BP 116/63  Pulse 75  Temp 97.8 F (36.6 C)  Resp 16  Physical Exam  Nursing note and vitals reviewed. Constitutional: He is oriented to person, place, and time. He appears well-developed and well-nourished.  HENT:  Head: Normocephalic and atraumatic.  Right Ear: External ear normal.  Left Ear: External ear normal.  Nose: Nose normal.  Mouth/Throat: Oropharynx is clear and moist.  Eyes: Conjunctivae and EOM are normal. Pupils are equal, round, and reactive to light.  Neck: Normal range of motion. Neck supple.  Cardiovascular: Normal rate, regular rhythm, normal heart sounds and intact distal pulses.   Pulmonary/Chest: Effort normal and breath sounds normal.  Abdominal: Soft. Bowel sounds are normal.  Musculoskeletal: Normal range of motion.  Neurological: He is alert and oriented to person, place, and time. He has normal reflexes.  Skin: Skin is warm and dry.  Psychiatric: He has a normal mood and affect. His behavior is normal. Thought content normal.    ED Course  Procedures (including critical care time)  Labs Reviewed - No data to display No results found.   No diagnosis found.    Date: 06/26/2011  Rate: 68  Rhythm: normal sinus rhythm  QRS Axis: right  Intervals: normal  ST/T Wave abnormalities:peaked t waves st elevation v1 and v2  Conduction Disutrbances:nonspecific intraventricular conduction delay  Narrative Interpretation:   Old EKG Reviewed: changes noted   MDM   Patient evaluated an EKG interpreted with peaked T waves. Calcium gluconate 1 amp ordered. D50 1 amp and regular insulin 10 units  ordered IV. Patient is on monitor and remained in normal sinus rhythm. Sodium bicarbonate 100 tones 50 mEq in D5 1 L ordered over 2 hours. Kayexalate 45  po ordered.  Patient's care discussed with Dr. Jonnie Finner on call for nephrology. He is in agreement with the above medications. His not think that given patient's renal function he will need immediate dialysis. He does advised the patient be transferred to [facility] count were dialysis capability is available. He will consult on patient on transfer. The hospitalists has been paged for admission.  Patient's care discussed with Dr. Titus Mould. Patient will be transferred to [facility] cone. ABG is ordered and consultation with Dr. Titus Mould. Results for orders placed during the hospital encounter of 06/26/11  CBC      Component Value Range   WBC 7.8  4.0 - 10.5 (K/uL)   RBC 3.65 (*) 4.22 - 5.81 (MIL/uL)   Hemoglobin 10.8 (*) 13.0 - 17.0 (g/dL)   HCT 31.8 (*) 39.0 - 52.0 (%)   MCV 87.1  78.0 - 100.0 (fL)   MCH 29.6  26.0 - 34.0 (pg)   MCHC 34.0  30.0 - 36.0 (g/dL)   RDW 13.3  11.5 - 15.5 (%)   Platelets 125 (*) 150 - 400 (K/uL)  DIFFERENTIAL      Component Value Range   Neutrophils Relative 55  43 - 77 (%)   Neutro Abs 4.3  1.7 - 7.7 (K/uL)   Lymphocytes Relative 26  12 - 46 (%)   Lymphs Abs 2.0  0.7 - 4.0 (K/uL)   Monocytes Relative 7  3 - 12 (%)   Monocytes Absolute 0.5  0.1 - 1.0 (K/uL)   Eosinophils Relative 13 (*) 0 - 5 (%)   Eosinophils Absolute 1.0 (*) 0.0 - 0.7 (K/uL)   Basophils Relative 0  0 - 1 (%)   Basophils Absolute 0.0  0.0 - 0.1 (K/uL)  POCT I-STAT, CHEM 8      Component Value Range   Sodium 134 (*) 135 - 145 (mEq/L)   Potassium 8.9 (*) 3.5 - 5.1 (mEq/L)   Chloride 115 (*) 96 - 112 (mEq/L)   BUN 48 (*) 6 - 23 (mg/dL)   Creatinine, Ser 2.10 (*) 0.50 - 1.35 (mg/dL)   Glucose, Bld 296 (*) 70 - 99 (mg/dL)   Calcium, Ion 1.26  1.12 - 1.32 (mmol/L)   TCO2 16  0 - 100 (mmol/L)   Hemoglobin 11.2 (*) 13.0 - 17.0 (g/dL)   HCT 33.0 (*)  39.0 - 52.0 (%)   Comment NOTIFIED PHYSICIAN     Results in plan discussed with patient and his mother. Patient has not had any ectopy noted on monitor. PA and stable at this time.  CRITICAL CARE Performed by: Shaune Pollack   Total critical care time: 60  Critical care time was exclusive of separately billable procedures and treating other patients.  Critical care was necessary to treat or prevent imminent or life-threatening deterioration.  Critical care was time spent personally by me on the following activities: development of treatment plan with patient and/or surrogate as well as nursing, discussions with consultants, evaluation of patient's response to treatment, examination of patient, obtaining history from patient or surrogate, ordering and performing treatments and interventions, ordering and review of laboratory studies, ordering and review of radiographic studies, pulse oximetry and re-evaluation of patient's condition.        Shaune Pollack, MD 06/26/11 [phone]

## 2011-06-27 DIAGNOSIS — D649 Anemia, unspecified: Secondary | ICD-10-CM

## 2011-06-27 LAB — RENAL FUNCTION PANEL
BUN: 33 mg/dL — ABNORMAL HIGH (ref 6–23)
CO2: 22 mEq/L (ref 19–32)
Chloride: 107 mEq/L (ref 96–112)
Creatinine, Ser: 1.55 mg/dL — ABNORMAL HIGH (ref 0.50–1.35)
GFR calc non Af Amer: 54 mL/min — ABNORMAL LOW (ref >90)

## 2011-06-27 LAB — BASIC METABOLIC PANEL
BUN: 34 mg/dL — ABNORMAL HIGH (ref 6–23)
Calcium: 8.7 mg/dL (ref 8.4–10.5)
Creatinine, Ser: 1.61 mg/dL — ABNORMAL HIGH (ref 0.50–1.35)
GFR calc Af Amer: 59 mL/min — ABNORMAL LOW (ref >90)
GFR calc non Af Amer: 51 mL/min — ABNORMAL LOW (ref >90)

## 2011-06-27 LAB — CBC
HCT: 27.3 % — ABNORMAL LOW (ref 39.0–52.0)
MCHC: 34.1 g/dL (ref 30.0–36.0)
MCV: 86.4 fL (ref 78.0–100.0)
RDW: 13.5 % (ref 11.5–15.5)

## 2011-06-27 LAB — GLUCOSE, CAPILLARY
Glucose-Capillary: 165 mg/dL — ABNORMAL HIGH (ref 70–99)
Glucose-Capillary: 89 mg/dL (ref 70–99)

## 2011-06-27 LAB — T4, FREE: Free T4: 1.1 ng/dL (ref 0.80–1.80)

## 2011-06-27 MED ORDER — LEVOTHYROXINE SODIUM 150 MCG PO TABS: 150.0000 ug | ORAL_TABLET | Freq: Every day | ORAL | Status: DC

## 2011-06-27 MED ORDER — LEVOTHYROXINE SODIUM 150 MCG PO TABS
150.0000 ug | ORAL_TABLET | Freq: Every day | ORAL | Status: DC
  Administered 2011-06-27: 150 ug via ORAL
  Filled 2011-06-27 (×2): qty 1

## 2011-06-27 MED ORDER — SODIUM BICARBONATE 650 MG PO TABS
650.0000 mg | ORAL_TABLET | Freq: Three times a day (TID) | ORAL | Status: DC
Start: 2011-06-27 — End: 2011-06-27
  Administered 2011-06-27: 650 mg via ORAL
  Filled 2011-06-27 (×3): qty 1

## 2011-06-27 MED ORDER — SODIUM BICARBONATE 650 MG PO TABS: 650.0000 mg | ORAL_TABLET | Freq: Three times a day (TID) | ORAL | Status: DC

## 2011-06-27 NOTE — Plan of Care (Signed)
Problem: Phase I Progression Outcomes Goal: Other Phase I Outcomes/Goals Outcome: Completed/Met Date Met:  06/27/11 Potassium decreasing

## 2011-06-27 NOTE — Progress Notes (Signed)
CBG: 62  Treatment: 15 GM carbohydrate snack  Symptoms: None  Follow-up CBG: Time:0305 CBG Result:119  Possible Reasons for Event: Change in activity  Comments/MD notified:    Shelene Krage, [place]

## 2011-06-27 NOTE — Progress Notes (Signed)
[facility] - PROGRESS NOTE Resident Note   Please see below for attending addendum to resident note.  Subjective:   His blood sugar dropped to 60 last night around 2 AM. No complaints- " I feel good".  No further muscle weakness Objective:    Vital Signs:   Temp:  [97.8 F (36.6 C)-98.3 F (36.8 C)] 98.1 F (36.7 C) (05/23 0428) Pulse Rate:  [70-99] 76  (05/23 0600) Resp:  [14-22] 19  (05/23 0600) BP: (102-135)/(44-82) 129/76 mmHg (05/23 0600) SpO2:  [99 %-100 %] 99 % (05/23 0600) Weight:  [176 lb (79.833 kg)] 176 lb (79.833 kg) (05/23 0500) Last BM Date: 06/26/11  24-hour weight change: Weight change:   Weight trends: Filed Weights   06/27/11 0500  Weight: 176 lb (79.833 kg)    Intake/Output:  05/22 0701 - 05/23 0700 In: 3503 [P.O.:840; I.V.:2663] Out: -  BP 129/76  Pulse 76  Temp(Src) 97.9 F (36.6 C) (Oral)  Resp 19  Wt 79.833 kg (176 lb)  SpO2 99% Physical Exam: General: Vital signs reviewed and noted. Well-developed, well-nourished, in no acute distress; alert, appropriate and cooperative throughout examination.  Lungs:  Normal respiratory effort. Clear to auscultation BL without crackles or wheezes.  Heart: RRR. S1 and S2 normal without gallop, murmur, or rubs.  Abdomen:  BS normoactive. Soft, Nondistended, non-tender.  No masses or organomegaly.  Extremities: No pretibial edema.     Labs: Basic Metabolic Panel:  Lab [id number] 06/27/11 0700 06/26/11 2145 06/26/11 2001 06/26/11 1412  NA 139 138 140 138 141  K 5.4* 5.3* 5.5* 6.0* 7.1*  CL 107 106 108 107 112  CO2 22 23 23 23 20   GLUCOSE 99 114* 86 236* 163*  BUN 33* 34* 38* 39* 44*  CREATININE 1.55* 1.61* 1.80* 1.79* 1.89*  CALCIUM 8.8 8.7 8.6 -- --  MG -- 1.4* -- -- --  PHOS 3.5 3.6 -- 3.4 2.8    Liver Function Tests:  Lab 06/26/11 2001 06/26/11 1412 06/26/11 1024  AST -- -- 30  ALT -- -- 26  ALKPHOS -- -- 85  BILITOT -- -- 0.2*  PROT -- -- 7.5  ALBUMIN 3.0* 3.3* 3.7     CBC:  Lab 06/27/11 0700 06/26/11 1037 06/26/11 1024  WBC 5.9 -- 7.8  NEUTROABS -- -- 4.3  HGB 9.3* 11.2* 10.8*  HCT 27.3* 33.0* 31.8*  MCV 86.4 -- 87.1  PLT PENDING -- 125*    Cardiac Enzymes:  Lab 06/26/11 2146 06/26/11 1524 06/26/11 1024  CKTOTAL 126 125 156  CKMB 8.8* 10.3* 13.4*  CKMBINDEX -- -- --  TROPONINI <0.30 <0.30 <0.30    CBG:  Lab 06/27/11 0306 06/27/11 0228 06/27/11 0004 06/26/11 2230 06/26/11 1754  GLUCAP 119* 67* 109* 26 144*    Microbiology: Results for orders placed during the hospital encounter of 06/26/11  MRSA PCR SCREENING     Status: Normal   Collection Time   06/26/11  1:34 PM      Component Value Range Status Comment   MRSA by PCR NEGATIVE  NEGATIVE  Final      Imaging: Dg Chest Port 1 View  06/26/2011  *RADIOLOGY REPORT*  Clinical Data: Hyperkaliemia.  Diabetes.  PORTABLE CHEST - 1 VIEW  Comparison: 06/25/2004.  Findings: 1031 hours. There are low lung volumes.  The lungs are clear.  There is no pleural effusion or pneumothorax.  The heart size and mediastinal contours are normal.  IMPRESSION: No active cardiopulmonary process.  Original Report Authenticated  By: Vivia Ewing, M.D.      Medications:    Infusions:    . DISCONTD: sodium chloride 100 mL/hr at 06/26/11 1400  . DISCONTD:  sodium bicarbonate infusion 1000 mL      Scheduled Medications:    . aspirin  81 mg Oral Daily  . calcium gluconate  1 g Intravenous Once  . dextrose  1 ampule Intravenous Once  . epoetin (EPOGEN/PROCRIT) injection  10,000 Units Subcutaneous Once  . ferrous sulfate  325 mg Oral BID WC  . fluticasone  2 spray Each Nare Daily  . heparin  5,000 Units Subcutaneous Q8H  . insulin aspart  10 Units Intravenous Once  . insulin pump   Subcutaneous TID AC, HS, 0200  . levothyroxine  200 mcg Oral QAC breakfast  . loratadine  10 mg Oral QPM  . niacin  1,500 mg Oral QHS  . omega-3 acid ethyl esters  2 g Oral BID  . pantoprazole  40 mg Oral Q1200  .  simvastatin  40 mg Oral QHS  . sodium bicarbonate      . sodium bicarbonate  150 mEq Intravenous Once  . sodium bicarbonate  150 mEq Intravenous Once  . sodium bicarbonate  50 mEq Intravenous Once  . sodium chloride  500 mL Intravenous Once  . sodium polystyrene  15 g Oral Q12H  . sodium polystyrene  30 g Oral Once  . sodium polystyrene  45 g Oral Once  . testosterone  5 g Transdermal Daily  . venlafaxine  50 mg Oral BID  . DISCONTD: aspirin  324 mg Oral NOW  . DISCONTD: aspirin  300 mg Rectal NOW  . DISCONTD: insulin aspart      . DISCONTD: insulin regular  10 Units Intravenous Once  . DISCONTD: levocetirizine  2.5 mg Oral QPM  . DISCONTD: sodium bicarbonate  50 mEq Intravenous Once  . DISCONTD: sodium chloride  1,000 mL Intravenous Once  . DISCONTD: sodium chloride  3 mL Intravenous Q12H  . DISCONTD: sodium polystyrene  15 g Oral Q6H    PRN Medications: sodium chloride, acetaminophen, meclizine, DISCONTD: sodium chloride, DISCONTD: sodium chloride   Assessment/ Plan:   Pt is a 41 y.o. yo male with a PMHX of Type 1 DM, CKD stage 3 multiple episodes of hyperkalemia was transferred from [place] to [facility] for management of his hyperkalemia and acute on chronic renal failure. He has had several admissions in the last year for hyperkalemia( last one in 04/13).  He was on oral bicarbonate as outpatient for his hyperkalemia.  1. Hyperkalemia: likely secondary to TMP-SMZ in the setting of his CKD from his diabetes with known K excretory defect. Patient presents with K-- 8.9.His potassium down to 5.5 today. His EKG at admission showed tall tented T waves and wide QRS.  - TMP- SMP was d/c.He was educated on not to take TMP- SMX in future. -2. Acute on chronic Renal failure stage 3: This is likely secondary to medication side effects( TMP-SMZ) in the setting of his CKD from diabetes. His baseline Cr ~1.5. He is back at baseline -4. Type 1 DM: AIC -6.5. On insulin pump.  Not a  candidate for ACE/ARB because of his issues with hyperkalemia.  5. Anemia: His baseline Hb-9-10.Likely anemia of chronic disease with iron -76, saturation -38%, ferritin of 1118, TIBC-200 from 04/13.  -On iron supplementation and Procrit ( 10,000 mg monthly).  - He got his procrit shot here yesterday. 6. Hypothyroidism: On synthroid.  Hi - His TSH is low to 0.021.Marland Kitchen He states that his PCP is adjusting the dose of his synthroid- it was decreaded to 150 mcg a month ago. - Will decrease the dose of his synthroid. 7. HLD: Cont statin and niacin.  8. Thrombocytopenia: Platelets stable. Continue to monitor CBC.  9. Dispo; Transfer to regular bed.   Length of Stay: 1 days  Jane Canary, MD PGYII, Internal Medicine Resident 06/27/2011, 7:29 AM  I have seen and examined this patient and agree with plan as outlined.  I believe from a renal standpoint he could be d/c home.  He can have repeat labs done in Dr. Tawanna Sat office tomorrow and Monday and have those FAXED to me.  Would send out with oral sodium bicarbonate 650 TID (he has old Rx at home).  I will see him in f/u in June (appt already scheduled).  He received his scheduled procrit here yesterday Melita Villalona B,MD 06/27/2011 9:10 AM

## 2011-06-27 NOTE — Progress Notes (Signed)
Name: Patrick Brown MRN: [id number] DOB: 01-28-1971    LOS: 1  Referring Provider:  EDP Reason for Referral:  Weakness, hyperkalemia   PULMONARY / CRITICAL CARE MEDICINE  HPI:  41yo male with hx DM, anemia, CKD followed by Dr. Lorrene Reid presented 5/22 to [facility] ER with acute weakness.  He had been in his usual state of health aside from a sinus infection for which he was being treated with Septra.  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  On arrival to [facility] ED he was found to have significant hyperkalemia with K>7.5 and was tx to [facility] for ICU admit and renal f/u.    Subjective: Feels much better. Strength improved.  Denies chest pain, dyspnea, abdominal pain.  Vital Signs: Temp:  [97.9 F (36.6 C)-98.3 F (36.8 C)] 97.9 F (36.6 C) (05/23 0756) Pulse Rate:  [70-99] 76  (05/23 0600) Resp:  [14-22] 17  (05/23 0800) BP: (102-135)/(44-82) 129/76 mmHg (05/23 0600) SpO2:  [97 %-100 %] 97 % (05/23 0915) Weight:  [176 lb (79.833 kg)] 176 lb (79.833 kg) (05/23 0500) On RA   Physical Examination: General: pleasant male, NAD  Neuro: awake, alert, appropriate, MAE  CV: s1s2 rrr, no m/r/g PULM: resps even non labored on RA, CTA GI: abd soft, +bs Extremities: warm and dry no edema   Principal Problem:  *Hyperkalemia Active Problems:  Chronic kidney disease, stage 3, mod decreased GFR  Anemia  DM (diabetes mellitus), type 1, uncontrolled  ASSESSMENT AND PLAN  PULMONARY  A:  No active problems  P:   -  No intervention required  CARDIOVASCULAR  Lab 06/27/11 0700 06/26/11 2146 06/26/11 1524 06/26/11 1024  TROPONINI <0.30 <0.30 <0.30 <0.30  LATICACIDVEN -- -- -- --  PROBNP -- -- -- --   ECG:  NSR Lines: NA  A: No active problems P:  -no interventions at this time  RENAL  Lab 06/27/11 0756 06/27/11 0700 06/26/11 2145 06/26/11 2001 06/26/11 1412  NA 139 138 140 138 141  K 5.4* 5.3* -- -- --  CL  107 106 108 107 112  CO2 22 23 23 23 20   BUN 33* 34* 38* 39* 44*  CREATININE 1.55* 1.61* 1.80* 1.79* 1.89*  CALCIUM 8.8 8.7 8.6 8.5 9.0  MG -- 1.4* -- -- --  PHOS 3.5 3.6 -- 3.4 2.8    A:  Hyperkalemia likely secondary to septra on the background of CKD. Acute on chronic renal failure.  Hypovolemia, acidosis.  Much improved 5/23. P:   -avoid septra in future -oral sodium bicarbonate  -f/u with Dr. Lorrene Reid  GASTROINTESTINAL  Lab 06/27/11 0756 06/26/11 2001 06/26/11 1412 06/26/11 1024  AST -- -- -- 30  ALT -- -- -- 26  ALKPHOS -- -- -- 85  BILITOT -- -- -- 0.2*  PROT -- -- -- 7.5  ALBUMIN 3.2* 3.0* 3.3* 3.7   A:  No acute issues  P:   Renal diet  HEMATOLOGIC  Lab 06/27/11 0700 06/26/11 1037 06/26/11 1024  HGB 9.3* 11.2* 10.8*  HCT 27.3* 33.0* 31.8*  PLT 88* -- 125*  INR -- -- --  APTT -- -- --   A:  Chronic anemia  P:  Continue iron Procrit as outpt  INFECTIOUS  Lab 06/27/11 0700 06/26/11 1024  WBC 5.9 7.8  PROCALCITON -- --   Cultures: none Antibiotics: None  A: Recent sinusitis.  P:   S/p  9 days septra as outpt  Hold on further abx for now   ENDOCRINE  Lab 06/27/11 0306 06/27/11 0228 06/27/11 0004 06/26/11 2230 06/26/11 1754  GLUCAP 119* 61* 109* 73 144*   A:  DM. Hypothyroidism. P:   Ok for pt to cont insulin pump for now Monitor CBG Synthroid Testosterone  NEUROLOGIC  A:  Muscle weakness - resolved.  P:   Neuro checks  DISPOSITION -d/c home >> f/u with Dr. Laurance Flatten and Dr. Lorrene Reid as outpt.   Chesley Mires, MD Pager:  [phone] 06/27/2011  9:48 AM

## 2011-06-27 NOTE — Discharge Instructions (Signed)
Do not return to work until Friday 5/31 Increase activity slowly

## 2011-06-27 NOTE — Discharge Summary (Signed)
Physician Discharge Summary  Patient ID: Patrick Brown MRN: [id number] DOB/AGE: 1970-10-31 41 y.o.  Admit date: 06/26/2011 Discharge date: 06/27/2011    Discharge Diagnoses:  Principal Problem:  *Hyperkalemia Active Problems:  Chronic kidney disease, stage 3, mod decreased GFR  Anemia  DM (diabetes mellitus), type 1, uncontrolled    Brief Summary: Patrick Brown is a 41yo male with hx DM, anemia, CKD followed by Dr. Lorrene Reid presented 5/22 to [facility] ER with acute weakness. He had been in his usual state of health aside from a sinus infection for which he was being treated with Septra. 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 On arrival to [facility] ED he was found to have significant hyperkalemia with K>7.5 and was tx to [facility] for ICU admit and renal f/u.    Hyperkalemia -- likely secondary to Septra given for sinusitis in setting CKD with known K excretory defect.  Initially with K=8.9.  Now down to 5.5 after HCO3, kayexalate, Calcium, IV insulin.  Septra was d/c and he has been educated not to take it in the future.   Acute on chronic renal failure - stage 3 - r/t DM.  Acute failure also likely r/t abx.  Now back to baseline SCr~1.5.  He will f/u as prev scheduled with Dr. Lorrene Reid in office.    Type 1 DM - HgbA1c=6.5.  Well controlled with insulin pump.  Not a candidate for ACE/ARB r/t issues with hyperkalemia. He will cont insulin pump as prior to admit and f/u with PCP.   Hypothyroid - TSH low at 0.021.  Will decrease synthroid to 180mcg daily and he will f/u with PCP.   Muscle weakness - r/t hyperkalemia - resolved.   Chronic anemia - managed by renal.  Cont iron daily and procrit monthly per renal.   Consults: Renal - [place]   Lines/tubes: none  Microbiology/Sepsis markers: none   Discharge Labs  BMET  Lab 06/27/11 0756 06/27/11 0700 06/26/11 2145 06/26/11 2001 06/26/11 1412  NA 139 138 140 138 141   K 5.4* 5.3* -- -- --  CL 107 106 108 107 112  CO2 22 23 23 23 20   GLUCOSE 99 114* 86 236* 163*  BUN 33* 34* 38* 39* 44*  CREATININE 1.55* 1.61* 1.80* 1.79* 1.89*  CALCIUM 8.8 8.7 8.6 8.5 9.0  MG -- 1.4* -- -- --  PHOS 3.5 3.6 -- 3.4 2.8     CBC   Lab 06/27/11 0700 06/26/11 1037 06/26/11 1024  HGB 9.3* 11.2* 10.8*  HCT 27.3* 33.0* 31.8*  WBC 5.9 -- 7.8  PLT 88* -- 125*     Discharge Orders    Future Orders Please Complete By Expires   Diet general      Comments:   Potassium restricted renal diet AND carb modified   Increase activity slowly      Discharge instructions      Comments:   Do not return to work until Friday 5/31       Follow-up Information    Follow up with Redge Gainer, MD on 07/02/2011. (With Hildred Laser PA at 2:15pm)    Contact information:   [address] [place] [phone]       Follow up with Lucrezia Starch, MD on 06/27/2011. (as previously scheduled )    Contact information:   [facility] [place] [id number] [place], [place] Medication Instructions [id number]   Printed on:06/27/11 1023  Medication Information                    venlafaxine (EFFEXOR) 50 MG tablet Take 50 mg by mouth 2 (two) times daily.             aspirin (ASPIRIN CHILDRENS) 81 MG chewable tablet Chew 81 mg by mouth daily.            omega-3 acid ethyl esters (LOVAZA) 1 G capsule Take 2 g by mouth 2 (two) times daily.             testosterone (ANDROGEL) 50 MG/5GM GEL Place 5 g onto the skin daily.            zolpidem (AMBIEN) 10 MG tablet Take 10 mg by mouth at bedtime as needed. For sleep           Lancets MISC by Does not apply route.             glucose blood test strip 1 each by Other route as needed. Use as instructed            Blood Glucose Monitoring Suppl (B-D LOGIC BLOOD GLUCOSE) W/DEVICE KIT by Does  not apply route.             Insulin Infusion Pump Supplies MISC by Does not apply route.             insulin lispro (HUMALOG) 100 UNIT/ML injection Inject into the skin continuous. Pt.  Uses insulin via insulin pump .           ferrous sulfate (KP FERROUS SULFATE) 325 (65 FE) MG tablet Take 325 mg by mouth 2 (two) times daily.             simvastatin (ZOCOR) 40 MG tablet Take 40 mg by mouth at bedtime.             dexlansoprazole (DEXILANT) 60 MG capsule Take 60 mg by mouth daily.             Glucosamine-Chondroit-Vit C-Mn (GLUCOSAMINE 1500 COMPLEX PO) Take 2 tablets by mouth.           acetaminophen (TYLENOL) 500 MG tablet Take 1,000 mg by mouth every 6 (six) hours as needed.           meclizine (ANTIVERT) 25 MG tablet Take 12.5 mg by mouth 3 (three) times daily as needed. For vertigo           fluticasone (FLONASE) 50 MCG/ACT nasal spray Place 2 sprays into the nose daily.           levocetirizine (XYZAL) 5 MG tablet Take 2.5 mg by mouth every evening.           niacin 500 MG tablet Take 1,500 mg by mouth daily with breakfast.           levothyroxine (SYNTHROID, LEVOTHROID) 150 MCG tablet Take 1 tablet (150 mcg total) by mouth daily before breakfast.           sodium bicarbonate 650 MG tablet Take 1 tablet (650 mg total) by mouth 3 (three) times daily.                Disposition: 01-Home or Self Care  Discharged Condition: Patrick Brown has met maximum benefit of inpatient care and  is medically stable and cleared for discharge.  Patient is pending follow up as above.      Time spent on disposition:  Greater than 35 minutes.   SignedDarlina Sicilian, NP 06/27/2011  10:23 AM Pager: [phone]  *Care during the described time interval was provided by me and/or other providers on the critical care team. I have reviewed this patient's available data, including medical history, events of note, physical examination and test results as part of my  evaluation.   Chesley Mires, MD 06/27/2011, 10:57 AM Pager:  [phone]

## 2011-07-29 ENCOUNTER — Other Ambulatory Visit (HOSPITAL_COMMUNITY): Payer: Self-pay | Admitting: *Deleted

## 2011-07-30 ENCOUNTER — Encounter (HOSPITAL_COMMUNITY)
Admission: RE | Admit: 2011-07-30 | Discharge: 2011-07-30 | Disposition: A | Discharge status: Home / Self Care | Payer: BC Managed Care – PPO | Source: Ambulatory Visit | Attending: Nephrology | Admitting: Nephrology

## 2011-07-30 DIAGNOSIS — D649 Anemia, unspecified: Secondary | ICD-10-CM | POA: Insufficient documentation

## 2011-07-30 LAB — IRON AND TIBC
Iron: 64 ug/dL (ref 42–135)
Saturation Ratios: 28 % (ref 20–55)
TIBC: 225 ug/dL (ref 215–435)

## 2011-07-30 MED ORDER — EPOETIN ALFA 20000 UNIT/ML IJ SOLN
20000.0000 [IU] | INTRAMUSCULAR | Status: DC
  Administered 2011-07-30: 20000 [IU] via SUBCUTANEOUS

## 2011-07-30 MED ORDER — EPOETIN ALFA 20000 UNIT/ML IJ SOLN
INTRAMUSCULAR | Status: AC
  Administered 2011-07-30: 20000 [IU] via SUBCUTANEOUS
  Filled 2011-07-30: qty 1

## 2011-08-26 ENCOUNTER — Other Ambulatory Visit: Payer: Self-pay | Admitting: Dermatology

## 2011-08-29 ENCOUNTER — Encounter (HOSPITAL_COMMUNITY)
Admission: RE | Admit: 2011-08-29 | Discharge: 2011-08-29 | Disposition: A | Discharge status: Home / Self Care | Payer: BC Managed Care – PPO | Source: Ambulatory Visit | Attending: Nephrology | Admitting: Nephrology

## 2011-08-29 DIAGNOSIS — D649 Anemia, unspecified: Secondary | ICD-10-CM | POA: Insufficient documentation

## 2011-08-29 LAB — POCT HEMOGLOBIN-HEMACUE: Hemoglobin: 12.2 g/dL — ABNORMAL LOW (ref 13.0–17.0)

## 2011-08-29 LAB — IRON AND TIBC
Saturation Ratios: 42 % (ref 20–55)
UIBC: 144 ug/dL (ref 125–400)

## 2011-08-29 LAB — FERRITIN: Ferritin: 1399 ng/mL — ABNORMAL HIGH (ref 22–322)

## 2011-08-29 MED ORDER — EPOETIN ALFA 20000 UNIT/ML IJ SOLN
INTRAMUSCULAR | Status: AC
  Filled 2011-08-29: qty 1

## 2011-08-29 MED ORDER — EPOETIN ALFA 20000 UNIT/ML IJ SOLN: 20000.0000 [IU] | INTRAMUSCULAR | Status: DC

## 2011-09-16 ENCOUNTER — Encounter (HOSPITAL_COMMUNITY)
Admission: RE | Admit: 2011-09-16 | Discharge: 2011-09-16 | Disposition: A | Discharge status: Home / Self Care | Payer: BC Managed Care – PPO | Source: Ambulatory Visit | Attending: Nephrology | Admitting: Nephrology

## 2011-09-16 DIAGNOSIS — D649 Anemia, unspecified: Secondary | ICD-10-CM | POA: Insufficient documentation

## 2011-09-16 MED ORDER — EPOETIN ALFA 20000 UNIT/ML IJ SOLN
INTRAMUSCULAR | Status: AC
  Administered 2011-09-16: 20000 [IU] via SUBCUTANEOUS
  Filled 2011-09-16: qty 1

## 2011-09-16 MED ORDER — EPOETIN ALFA 20000 UNIT/ML IJ SOLN
20000.0000 [IU] | INTRAMUSCULAR | Status: DC
  Administered 2011-09-16: 20000 [IU] via SUBCUTANEOUS

## 2011-10-25 ENCOUNTER — Encounter (HOSPITAL_COMMUNITY)
Admission: RE | Admit: 2011-10-25 | Discharge: 2011-10-25 | Disposition: A | Discharge status: Home / Self Care | Payer: BC Managed Care – PPO | Source: Ambulatory Visit | Attending: Nephrology | Admitting: Nephrology

## 2011-10-25 DIAGNOSIS — D649 Anemia, unspecified: Secondary | ICD-10-CM | POA: Insufficient documentation

## 2011-10-25 LAB — IRON AND TIBC
Iron: 59 ug/dL (ref 42–135)
TIBC: 228 ug/dL (ref 215–435)
UIBC: 169 ug/dL (ref 125–400)

## 2011-10-25 LAB — POCT HEMOGLOBIN-HEMACUE: Hemoglobin: 11.3 g/dL — ABNORMAL LOW (ref 13.0–17.0)

## 2011-10-25 MED ORDER — EPOETIN ALFA 20000 UNIT/ML IJ SOLN
20000.0000 [IU] | INTRAMUSCULAR | Status: DC
  Administered 2011-10-25: 20000 [IU] via SUBCUTANEOUS
  Filled 2011-10-25: qty 1

## 2011-10-26 LAB — FERRITIN: Ferritin: 902 ng/mL — ABNORMAL HIGH (ref 22–322)

## 2011-12-05 ENCOUNTER — Other Ambulatory Visit (HOSPITAL_COMMUNITY): Payer: Self-pay | Admitting: *Deleted

## 2011-12-06 ENCOUNTER — Encounter (HOSPITAL_COMMUNITY)
Admission: RE | Admit: 2011-12-06 | Discharge: 2011-12-06 | Disposition: A | Discharge status: Home / Self Care | Payer: BC Managed Care – PPO | Source: Ambulatory Visit | Attending: Nephrology | Admitting: Nephrology

## 2011-12-06 DIAGNOSIS — D649 Anemia, unspecified: Secondary | ICD-10-CM | POA: Insufficient documentation

## 2011-12-06 LAB — IRON AND TIBC: Saturation Ratios: 48 % (ref 20–55)

## 2011-12-06 LAB — POCT HEMOGLOBIN-HEMACUE: Hemoglobin: 11.5 g/dL — ABNORMAL LOW (ref 13.0–17.0)

## 2011-12-06 MED ORDER — EPOETIN ALFA 20000 UNIT/ML IJ SOLN
20000.0000 [IU] | INTRAMUSCULAR | Status: DC
  Administered 2011-12-06: 20000 [IU] via SUBCUTANEOUS

## 2011-12-06 MED ORDER — EPOETIN ALFA 20000 UNIT/ML IJ SOLN
INTRAMUSCULAR | Status: AC
  Administered 2011-12-06: 20000 [IU] via SUBCUTANEOUS
  Filled 2011-12-06: qty 1

## 2012-01-06 ENCOUNTER — Encounter (HOSPITAL_COMMUNITY)
Admission: RE | Admit: 2012-01-06 | Discharge: 2012-01-06 | Disposition: A | Discharge status: Home / Self Care | Payer: BC Managed Care – PPO | Source: Ambulatory Visit | Attending: Nephrology | Admitting: Nephrology

## 2012-01-06 DIAGNOSIS — D649 Anemia, unspecified: Secondary | ICD-10-CM | POA: Insufficient documentation

## 2012-01-06 LAB — POCT HEMOGLOBIN-HEMACUE: Hemoglobin: 12 g/dL — ABNORMAL LOW (ref 13.0–17.0)

## 2012-01-06 LAB — IRON AND TIBC: TIBC: 273 ug/dL (ref 215–435)

## 2012-01-06 LAB — FERRITIN: Ferritin: 976 ng/mL — ABNORMAL HIGH (ref 22–322)

## 2012-01-06 MED ORDER — EPOETIN ALFA 20000 UNIT/ML IJ SOLN: 20000.0000 [IU] | INTRAMUSCULAR | Status: DC

## 2012-01-23 ENCOUNTER — Encounter (HOSPITAL_COMMUNITY)
Admission: RE | Admit: 2012-01-23 | Discharge: 2012-01-23 | Disposition: A | Discharge status: Home / Self Care | Payer: BC Managed Care – PPO | Source: Ambulatory Visit | Attending: Nephrology | Admitting: Nephrology

## 2012-01-23 LAB — POCT HEMOGLOBIN-HEMACUE: Hemoglobin: 11.7 g/dL — ABNORMAL LOW (ref 13.0–17.0)

## 2012-01-23 MED ORDER — EPOETIN ALFA 20000 UNIT/ML IJ SOLN: 20000.0000 [IU] | INTRAMUSCULAR | Status: DC

## 2012-01-23 MED ORDER — EPOETIN ALFA 20000 UNIT/ML IJ SOLN
INTRAMUSCULAR | Status: AC
  Administered 2012-01-23: 20000 [IU] via SUBCUTANEOUS
  Filled 2012-01-23: qty 1

## 2012-02-28 ENCOUNTER — Other Ambulatory Visit: Payer: Self-pay | Admitting: Dermatology

## 2012-03-02 ENCOUNTER — Encounter (HOSPITAL_COMMUNITY)
Admission: RE | Admit: 2012-03-02 | Discharge: 2012-03-02 | Disposition: A | Discharge status: Home / Self Care | Payer: BC Managed Care – PPO | Source: Ambulatory Visit | Attending: Nephrology | Admitting: Nephrology

## 2012-03-02 DIAGNOSIS — D649 Anemia, unspecified: Secondary | ICD-10-CM | POA: Insufficient documentation

## 2012-03-02 LAB — IRON AND TIBC
Iron: 99 ug/dL (ref 42–135)
Saturation Ratios: 39 % (ref 20–55)
TIBC: 252 ug/dL (ref 215–435)

## 2012-03-02 LAB — POCT HEMOGLOBIN-HEMACUE: Hemoglobin: 11.7 g/dL — ABNORMAL LOW (ref 13.0–17.0)

## 2012-03-02 MED ORDER — EPOETIN ALFA 20000 UNIT/ML IJ SOLN
20000.0000 [IU] | INTRAMUSCULAR | Status: DC
  Administered 2012-03-02: 20000 [IU] via SUBCUTANEOUS

## 2012-03-02 MED ORDER — EPOETIN ALFA 20000 UNIT/ML IJ SOLN
INTRAMUSCULAR | Status: AC
  Filled 2012-03-02: qty 1

## 2012-03-27 ENCOUNTER — Other Ambulatory Visit (HOSPITAL_COMMUNITY): Payer: Self-pay | Admitting: *Deleted

## 2012-03-30 ENCOUNTER — Encounter (HOSPITAL_COMMUNITY)
Admission: RE | Admit: 2012-03-30 | Discharge: 2012-03-30 | Disposition: A | Discharge status: Home / Self Care | Payer: BC Managed Care – PPO | Source: Ambulatory Visit | Attending: Nephrology | Admitting: Nephrology

## 2012-03-30 DIAGNOSIS — D649 Anemia, unspecified: Secondary | ICD-10-CM | POA: Insufficient documentation

## 2012-03-30 LAB — IRON AND TIBC
Iron: 78 ug/dL (ref 42–135)
Saturation Ratios: 31 % (ref 20–55)
TIBC: 250 ug/dL (ref 215–435)

## 2012-03-30 MED ORDER — EPOETIN ALFA 20000 UNIT/ML IJ SOLN
20000.0000 [IU] | INTRAMUSCULAR | Status: DC
  Administered 2012-03-30: 20000 [IU] via SUBCUTANEOUS

## 2012-03-30 MED ORDER — EPOETIN ALFA 20000 UNIT/ML IJ SOLN
INTRAMUSCULAR | Status: AC
  Filled 2012-03-30: qty 1

## 2012-04-12 ENCOUNTER — Encounter (HOSPITAL_COMMUNITY): Payer: Self-pay | Admitting: *Deleted

## 2012-04-12 ENCOUNTER — Emergency Department (HOSPITAL_COMMUNITY)
Admission: EM | Admit: 2012-04-12 | Discharge: 2012-04-13 | Disposition: A | Discharge status: Home / Self Care | Payer: BC Managed Care – PPO | Attending: Emergency Medicine | Admitting: Emergency Medicine

## 2012-04-12 DIAGNOSIS — H3500 Unspecified background retinopathy: Secondary | ICD-10-CM | POA: Insufficient documentation

## 2012-04-12 DIAGNOSIS — Z8639 Personal history of other endocrine, nutritional and metabolic disease: Secondary | ICD-10-CM | POA: Insufficient documentation

## 2012-04-12 DIAGNOSIS — Z7982 Long term (current) use of aspirin: Secondary | ICD-10-CM | POA: Insufficient documentation

## 2012-04-12 DIAGNOSIS — D509 Iron deficiency anemia, unspecified: Secondary | ICD-10-CM | POA: Insufficient documentation

## 2012-04-12 DIAGNOSIS — Z87448 Personal history of other diseases of urinary system: Secondary | ICD-10-CM | POA: Insufficient documentation

## 2012-04-12 DIAGNOSIS — E785 Hyperlipidemia, unspecified: Secondary | ICD-10-CM | POA: Insufficient documentation

## 2012-04-12 DIAGNOSIS — E1065 Type 1 diabetes mellitus with hyperglycemia: Secondary | ICD-10-CM

## 2012-04-12 DIAGNOSIS — E039 Hypothyroidism, unspecified: Secondary | ICD-10-CM | POA: Insufficient documentation

## 2012-04-12 DIAGNOSIS — Z79899 Other long term (current) drug therapy: Secondary | ICD-10-CM | POA: Insufficient documentation

## 2012-04-12 DIAGNOSIS — Z862 Personal history of diseases of the blood and blood-forming organs and certain disorders involving the immune mechanism: Secondary | ICD-10-CM | POA: Insufficient documentation

## 2012-04-12 DIAGNOSIS — Z794 Long term (current) use of insulin: Secondary | ICD-10-CM | POA: Insufficient documentation

## 2012-04-12 DIAGNOSIS — IMO0002 Reserved for concepts with insufficient information to code with codable children: Secondary | ICD-10-CM | POA: Insufficient documentation

## 2012-04-12 DIAGNOSIS — R0602 Shortness of breath: Secondary | ICD-10-CM | POA: Insufficient documentation

## 2012-04-12 DIAGNOSIS — N189 Chronic kidney disease, unspecified: Secondary | ICD-10-CM | POA: Insufficient documentation

## 2012-04-12 NOTE — ED Notes (Signed)
Pt reports shortness of breath for a couple of days - pt w/ hx of hyper/hypo-kalemia - pt concerned his potassium level is elevated tonight on Monday pt's K+ 4.7. EKG NSR, 18g RAC

## 2012-04-12 NOTE — ED Notes (Signed)
[id number] Expected date:<BR> Expected time:<BR> Means of arrival:<BR> Comments:<BR> EMS/[place]-SOB/Diabetic-saline lock

## 2012-04-13 ENCOUNTER — Other Ambulatory Visit: Payer: Self-pay

## 2012-04-13 ENCOUNTER — Emergency Department (HOSPITAL_COMMUNITY): Payer: BC Managed Care – PPO

## 2012-04-13 LAB — BASIC METABOLIC PANEL
BUN: 46 mg/dL — ABNORMAL HIGH (ref 6–23)
CO2: 25 mEq/L (ref 19–32)
Calcium: 9.1 mg/dL (ref 8.4–10.5)
Creatinine, Ser: 1.87 mg/dL — ABNORMAL HIGH (ref 0.50–1.35)
Glucose, Bld: 342 mg/dL — ABNORMAL HIGH (ref 70–99)

## 2012-04-13 MED ORDER — ACETAMINOPHEN 500 MG PO TABS
1000.0000 mg | ORAL_TABLET | Freq: Once | ORAL | Status: AC
  Administered 2012-04-13: 1000 mg via ORAL
  Filled 2012-04-13: qty 2

## 2012-04-13 NOTE — ED Provider Notes (Signed)
History     CSN: [id number]  Arrival date & time 04/12/12  2239   First MD Initiated Contact with Patient 04/13/12 0044      Chief Complaint  Patient presents with  . Shortness of Breath    (Consider location/radiation/quality/duration/timing/severity/associated sxs/prior treatment) HPI Patrick Brown is a 42 y.o. male preening with dyspnea and concern for hyperkalemia.  He has pertinent h/o DM1 now with insulin pump.  Denies history of CAD, chest pain, VTE, hemoptysis, cough, fever, chills, recent sick contacts, dysuria, frequency, abdominal pain, nausea or vomiting.  SOB is mild, intermittent, no alleviating or exacerbating factors.  He says he's felt this way in the past with hyperkalemia and had a K of 8 so would like further evaluation.  Past Medical History  Diagnosis Date  . IDDM (insulin dependent diabetes mellitus)   . Retinopathy     x2  . Proteinuria   . Dyslipidemia   . Anemia, iron deficiency On procrit  . Hematuria, microscopic 10/09    work up negative (Dr. Amalia Hailey)  . Diabetes mellitus   . Hypothyroidism   . CKD (chronic kidney disease)   . Low HDL (under 40)   . 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 Hyperkalemia     Past Surgical History  Procedure Laterality Date  . Eye surgery  2006    x2   . Vitrectomy  bilateral  . Insulin pump    . Colonoscopy    . Esophagogastroduodenoscopy      Family History  Problem Relation Age of Onset  . Diabetes type I Brother   . Prostate cancer Other     History  Substance Use Topics  . Smoking status: Never Smoker   . Smokeless tobacco: Never Used  . Alcohol Use: Yes     Comment: very rarely      Review of Systems At least 10pt or greater review of systems completed and are negative  except where specified in the HPI.  Allergies  Sulfa antibiotics; Ramipril; and Versed  Home Medications   Current Outpatient Rx  Name  Route  Sig  Dispense  Refill  . acetaminophen (TYLENOL) 500 MG tablet   Oral   Take 1,000 mg by mouth every 6 (six) hours as needed.         Marland Kitchen aspirin (ASPIRIN CHILDRENS) 81 MG chewable tablet   Oral   Chew 81 mg by mouth daily.          Marland Kitchen aspirin EC 325 MG tablet   Oral   Take 325 mg by mouth every evening. Take with niacin for flushing symptoms .         . ciprofloxacin (CIPRO) 750 MG tablet   Oral   Take 750 mg by mouth every other day.         Marland Kitchen dexlansoprazole (DEXILANT) 60 MG capsule   Oral   Take 60 mg by mouth daily.           . Difluprednate (DUREZOL) 0.05 % EMUL   Right Eye   Place 1 drop into the right eye 3 (three) times daily.         Marland Kitchen epoetin alfa (EPOGEN,PROCRIT) 09811 UNIT/ML injection   Subcutaneous   Inject 20,000 Units into the skin every 30 (thirty) days.         Marland Kitchen  ferrous sulfate (KP FERROUS SULFATE) 325 (65 FE) MG tablet   Oral   Take 325 mg by mouth 2 (two) times daily.           . fluticasone (FLONASE) 50 MCG/ACT nasal spray   Nasal   Place 2 sprays into the nose daily.         . furosemide (LASIX) 20 MG tablet   Oral   Take 20 mg by mouth 2 (two) times daily.         . Ganciclovir 0.15 % GEL   Right Eye   Place 1 application into the right eye 3 (three) times daily.         . Glucosamine-Chondroit-Vit C-Mn (GLUCOSAMINE 1500 COMPLEX PO)   Oral   Take 2 tablets by mouth.         . hydrOXYzine (ATARAX/VISTARIL) 10 MG tablet   Oral   Take 10 mg by mouth 2 (two) times daily. Itching and rash on right arm         . insulin lispro (HUMALOG) 100 UNIT/ML injection   Subcutaneous   Inject into the skin continuous. Pt.  Uses insulin via insulin pump .         . levocetirizine (XYZAL) 5 MG tablet   Oral   Take 2.5 mg by mouth every evening.         Marland Kitchen levothyroxine  (SYNTHROID, LEVOTHROID) 175 MCG tablet   Oral   Take 175 mcg by mouth daily.         Marland Kitchen losartan (COZAAR) 25 MG tablet   Oral   Take 25 mg by mouth every morning.         . meclizine (ANTIVERT) 25 MG tablet   Oral   Take 12.5 mg by mouth 3 (three) times daily as needed. For vertigo         . niacin 500 MG tablet   Oral   Take 1,500 mg by mouth every evening. Take with adult size aspirin.         Marland Kitchen omega-3 acid ethyl esters (LOVAZA) 1 G capsule   Oral   Take 2 g by mouth 2 (two) times daily.           . simvastatin (ZOCOR) 40 MG tablet   Oral   Take 40 mg by mouth at bedtime.           . sodium bicarbonate 650 MG tablet   Oral   Take 650 mg by mouth daily.         Marland Kitchen testosterone (ANDROGEL) 50 MG/5GM GEL   Transdermal   Place 5 g onto the skin daily.          . valACYclovir (VALTREX) 500 MG tablet   Oral   Take 500 mg by mouth 2 (two) times daily.         Marland Kitchen venlafaxine (EFFEXOR) 50 MG tablet   Oral   Take 50 mg by mouth 2 (two) times daily.           Marland Kitchen zolpidem (AMBIEN) 10 MG tablet   Oral   Take 10 mg by mouth at bedtime as needed. For sleep           BP 124/73  Pulse 99  Temp(Src) 98.1 F (36.7 C) (Oral)  Resp 20  Ht 5\' 10"  (1.778 m)  Wt 184 lb (83.462 kg)  BMI 26.4 kg/m2  SpO2 95%  Physical Exam  Nursing notes reviewed.  Electronic medical record  reviewed. VITAL SIGNS:   Filed Vitals:   04/12/12 2240 04/12/12 2315 04/13/12 0410  BP:  144/98 124/73  Pulse:  100 99  Temp:  98.9 F (37.2 C) 98.1 F (36.7 C)  TempSrc:  Oral Oral  Resp:  18 20  Height:  5\' 10"  (1.778 m)   Weight:  184 lb (83.462 kg)   SpO2: 100% 100% 95%   CONSTITUTIONAL: Awake, oriented, appears non-toxic HENT: Atraumatic, normocephalic, oral mucosa pink and moist, airway patent. Nares patent without drainage. External ears normal. EYES: Conjunctiva clear, EOMI, PERRLA NECK: Trachea midline, non-tender, supple CARDIOVASCULAR: Normal heart rate, Normal  rhythm, No murmurs, rubs, gallops PULMONARY/CHEST: Clear to auscultation, no rhonchi, wheezes, or rales. Symmetrical breath sounds. Non-tender. ABDOMINAL: Non-distended, soft, non-tender - no rebound or guarding.  BS normal. NEUROLOGIC: Non-focal, moving all four extremities, no gross sensory or motor deficits. EXTREMITIES: No clubbing, cyanosis, or edema SKIN: Warm, Dry, No erythema, No rash  ED Course  Procedures (including critical care time)  Date: 04/16/2012  Rate: 110  Rhythm: normal sinus rhythm  QRS Axis: right  Intervals: normal  ST/T Wave abnormalities: normal  Conduction Disutrbances: none  Narrative Interpretation: RAD is new, sinus tach, no ST-T wave abnormalities suggestive of acute ischemia or infarction     Labs Reviewed  BASIC METABOLIC PANEL - Abnormal; Notable for the following:    Sodium 134 (*)    Glucose, Bld 342 (*)    BUN 46 (*)    Creatinine, Ser 1.87 (*)    GFR calc non Af Amer 42 (*)    GFR calc Af Amer 49 (*)    All other components within normal limits   Dg Chest Port 1 View  04/13/2012  *RADIOLOGY REPORT*  Clinical Data: Shortness of breath  PORTABLE CHEST - 1 VIEW  Comparison: 06/26/2011  Findings: Hypoaeration.  Mild left lung base opacity.  No pleural effusion or pneumothorax.  Cardiomediastinal contours within normal range.  No acute osseous finding.  IMPRESSION: Mild left lung base opacity; atelectasis versus infiltrate.   Original Report Authenticated By: Carlos Levering, M.D.      1. Shortness of breath   2. DM (diabetes mellitus), type 1, uncontrolled   3. Chronic kidney disease       MDM  Patrick Brown is a 42 y.o. male w/ DM1 concerned for hyperkalemia - BMP shows elevated glucose and CKD. Pt says his baseline HR is 100s, low risk for PE per [place] - don't think further testing is indicated.  Doubt ACS - i.e anginal equiv, K is WNL.  Pt feeling fine. F/u w/ PCP.  I explained the diagnosis and have given explicit precautions to  return to the ER including any other new or worsening symptoms. The patient understands and accepts the medical plan as it's been dictated and I have answered their questions. Discharge instructions concerning home care and prescriptions have been given.  The patient is STABLE and is discharged to home in good condition.         Rhunette Croft, MD 04/16/12 1052

## 2012-04-14 ENCOUNTER — Other Ambulatory Visit: Payer: Self-pay | Admitting: Family Medicine

## 2012-04-14 ENCOUNTER — Encounter (HOSPITAL_COMMUNITY)
Admission: RE | Admit: 2012-04-14 | Discharge: 2012-04-14 | Disposition: A | Discharge status: Home / Self Care | Payer: BC Managed Care – PPO | Source: Ambulatory Visit | Attending: Family Medicine | Admitting: Family Medicine

## 2012-04-14 ENCOUNTER — Ambulatory Visit (HOSPITAL_COMMUNITY)
Admission: RE | Admit: 2012-04-14 | Discharge: 2012-04-14 | Disposition: A | Discharge status: Home / Self Care | Payer: BC Managed Care – PPO | Source: Ambulatory Visit | Attending: Family Medicine | Admitting: Family Medicine

## 2012-04-14 ENCOUNTER — Encounter (HOSPITAL_COMMUNITY): Payer: Self-pay

## 2012-04-14 DIAGNOSIS — R0602 Shortness of breath: Secondary | ICD-10-CM | POA: Insufficient documentation

## 2012-04-14 MED ORDER — TECHNETIUM TO 99M ALBUMIN AGGREGATED
6.0000 | Freq: Once | INTRAVENOUS | Status: AC | PRN
  Administered 2012-04-14: 6 via INTRAVENOUS

## 2012-04-14 MED ORDER — TECHNETIUM TC 99M DIETHYLENETRIAME-PENTAACETIC ACID
35.0000 | Freq: Once | INTRAVENOUS | Status: AC | PRN
  Administered 2012-04-14: 35 via RESPIRATORY_TRACT

## 2012-04-16 ENCOUNTER — Other Ambulatory Visit: Payer: Self-pay

## 2012-04-16 ENCOUNTER — Encounter (HOSPITAL_COMMUNITY): Payer: Self-pay | Admitting: Emergency Medicine

## 2012-04-16 ENCOUNTER — Institutional Professional Consult (permissible substitution): Payer: BC Managed Care – PPO | Admitting: Internal Medicine

## 2012-04-16 ENCOUNTER — Inpatient Hospital Stay (HOSPITAL_COMMUNITY)
Admission: EM | Admit: 2012-04-16 | Discharge: 2012-04-19 | DRG: 541 | Disposition: A | Discharge status: Home / Self Care | Payer: BC Managed Care – PPO | Attending: Internal Medicine | Admitting: Internal Medicine

## 2012-04-16 DIAGNOSIS — E1065 Type 1 diabetes mellitus with hyperglycemia: Secondary | ICD-10-CM | POA: Diagnosis present

## 2012-04-16 DIAGNOSIS — R1011 Right upper quadrant pain: Secondary | ICD-10-CM | POA: Diagnosis present

## 2012-04-16 DIAGNOSIS — E039 Hypothyroidism, unspecified: Secondary | ICD-10-CM

## 2012-04-16 DIAGNOSIS — R21 Rash and other nonspecific skin eruption: Secondary | ICD-10-CM | POA: Diagnosis present

## 2012-04-16 DIAGNOSIS — Z888 Allergy status to other drugs, medicaments and biological substances status: Secondary | ICD-10-CM

## 2012-04-16 DIAGNOSIS — R918 Other nonspecific abnormal finding of lung field: Secondary | ICD-10-CM

## 2012-04-16 DIAGNOSIS — N179 Acute kidney failure, unspecified: Secondary | ICD-10-CM | POA: Diagnosis present

## 2012-04-16 DIAGNOSIS — R6510 Systemic inflammatory response syndrome (SIRS) of non-infectious origin without acute organ dysfunction: Secondary | ICD-10-CM

## 2012-04-16 DIAGNOSIS — E109 Type 1 diabetes mellitus without complications: Secondary | ICD-10-CM | POA: Diagnosis present

## 2012-04-16 DIAGNOSIS — I129 Hypertensive chronic kidney disease with stage 1 through stage 4 chronic kidney disease, or unspecified chronic kidney disease: Secondary | ICD-10-CM | POA: Diagnosis present

## 2012-04-16 DIAGNOSIS — R0989 Other specified symptoms and signs involving the circulatory and respiratory systems: Secondary | ICD-10-CM

## 2012-04-16 DIAGNOSIS — R1112 Projectile vomiting: Secondary | ICD-10-CM

## 2012-04-16 DIAGNOSIS — K3184 Gastroparesis: Secondary | ICD-10-CM

## 2012-04-16 DIAGNOSIS — J189 Pneumonia, unspecified organism: Principal | ICD-10-CM

## 2012-04-16 DIAGNOSIS — R000 Tachycardia, unspecified: Secondary | ICD-10-CM

## 2012-04-16 DIAGNOSIS — Z79899 Other long term (current) drug therapy: Secondary | ICD-10-CM

## 2012-04-16 DIAGNOSIS — R0600 Dyspnea, unspecified: Secondary | ICD-10-CM

## 2012-04-16 DIAGNOSIS — Z794 Long term (current) use of insulin: Secondary | ICD-10-CM

## 2012-04-16 DIAGNOSIS — I951 Orthostatic hypotension: Secondary | ICD-10-CM | POA: Diagnosis present

## 2012-04-16 DIAGNOSIS — D649 Anemia, unspecified: Secondary | ICD-10-CM | POA: Diagnosis present

## 2012-04-16 DIAGNOSIS — R55 Syncope and collapse: Secondary | ICD-10-CM

## 2012-04-16 DIAGNOSIS — R509 Fever, unspecified: Secondary | ICD-10-CM

## 2012-04-16 DIAGNOSIS — E1039 Type 1 diabetes mellitus with other diabetic ophthalmic complication: Secondary | ICD-10-CM | POA: Diagnosis present

## 2012-04-16 DIAGNOSIS — D509 Iron deficiency anemia, unspecified: Secondary | ICD-10-CM

## 2012-04-16 DIAGNOSIS — A088 Other specified intestinal infections: Secondary | ICD-10-CM | POA: Diagnosis present

## 2012-04-16 DIAGNOSIS — Z882 Allergy status to sulfonamides status: Secondary | ICD-10-CM

## 2012-04-16 DIAGNOSIS — IMO0002 Reserved for concepts with insufficient information to code with codable children: Secondary | ICD-10-CM

## 2012-04-16 DIAGNOSIS — Z7982 Long term (current) use of aspirin: Secondary | ICD-10-CM

## 2012-04-16 DIAGNOSIS — R0609 Other forms of dyspnea: Secondary | ICD-10-CM

## 2012-04-16 DIAGNOSIS — N183 Chronic kidney disease, stage 3 unspecified: Secondary | ICD-10-CM

## 2012-04-16 DIAGNOSIS — E860 Dehydration: Secondary | ICD-10-CM | POA: Diagnosis present

## 2012-04-16 DIAGNOSIS — Z792 Long term (current) use of antibiotics: Secondary | ICD-10-CM

## 2012-04-16 DIAGNOSIS — E11319 Type 2 diabetes mellitus with unspecified diabetic retinopathy without macular edema: Secondary | ICD-10-CM | POA: Diagnosis present

## 2012-04-16 DIAGNOSIS — E785 Hyperlipidemia, unspecified: Secondary | ICD-10-CM | POA: Diagnosis present

## 2012-04-16 LAB — COMPREHENSIVE METABOLIC PANEL
ALT: 19 U/L (ref 0–53)
AST: 30 U/L (ref 0–37)
Alkaline Phosphatase: 87 U/L (ref 39–117)
CO2: 21 mEq/L (ref 19–32)
Calcium: 9 mg/dL (ref 8.4–10.5)
Chloride: 102 mEq/L (ref 96–112)
GFR calc Af Amer: 55 mL/min — ABNORMAL LOW (ref >90)
GFR calc non Af Amer: 48 mL/min — ABNORMAL LOW (ref >90)
Glucose, Bld: 117 mg/dL — ABNORMAL HIGH (ref 70–99)
Potassium: 4.2 mEq/L (ref 3.5–5.1)
Sodium: 136 mEq/L (ref 135–145)

## 2012-04-16 LAB — PROTIME-INR: INR: 1.06 (ref 0.00–1.49)

## 2012-04-16 LAB — BASIC METABOLIC PANEL
CO2: 22 mEq/L (ref 19–32)
Chloride: 96 mEq/L (ref 96–112)
Potassium: 4.8 mEq/L (ref 3.5–5.1)
Sodium: 131 mEq/L — ABNORMAL LOW (ref 135–145)

## 2012-04-16 LAB — CBC WITH DIFFERENTIAL/PLATELET
Basophils Absolute: 0 10*3/uL (ref 0.0–0.1)
HCT: 37.2 % — ABNORMAL LOW (ref 39.0–52.0)
HCT: 34 % — ABNORMAL LOW (ref 39.0–52.0)
Hemoglobin: 11.8 g/dL — ABNORMAL LOW (ref 13.0–17.0)
Lymphocytes Relative: 23 % (ref 12–46)
Lymphocytes Relative: 21 % (ref 12–46)
MCV: 85.4 fL (ref 78.0–100.0)
Monocytes Absolute: 0.4 10*3/uL (ref 0.1–1.0)
Monocytes Absolute: 0.5 10*3/uL (ref 0.1–1.0)
Monocytes Relative: 9 % (ref 3–12)
Neutro Abs: 3.5 10*3/uL (ref 1.7–7.7)
Neutro Abs: 3.4 10*3/uL (ref 1.7–7.7)
Platelets: 142 10*3/uL — ABNORMAL LOW (ref 150–400)
RDW: 14.2 % (ref 11.5–15.5)
WBC: 5.7 10*3/uL (ref 4.0–10.5)
WBC: 5.4 10*3/uL (ref 4.0–10.5)

## 2012-04-16 LAB — URINALYSIS, ROUTINE W REFLEX MICROSCOPIC
Glucose, UA: 500 mg/dL — AB
Leukocytes, UA: NEGATIVE
Protein, ur: NEGATIVE mg/dL

## 2012-04-16 LAB — PHOSPHORUS: Phosphorus: 2.5 mg/dL (ref 2.3–4.6)

## 2012-04-16 LAB — GLUCOSE, CAPILLARY

## 2012-04-16 LAB — ETHANOL: Alcohol, Ethyl (B): <11 mg/dL (ref 0–11)

## 2012-04-16 LAB — TROPONIN I
Troponin I: <0.3 ng/mL (ref <0.30)
Troponin I: <0.3 ng/mL (ref <0.30)

## 2012-04-16 LAB — PRO B NATRIURETIC PEPTIDE
Pro B Natriuretic peptide (BNP): 66.5 pg/mL (ref 0–125)
Pro B Natriuretic peptide (BNP): 94.3 pg/mL (ref 0–125)

## 2012-04-16 MED ORDER — SODIUM CHLORIDE 0.9 % IV SOLN
1000.0000 mL | INTRAVENOUS | Status: DC
  Administered 2012-04-16: 1000 mL via INTRAVENOUS

## 2012-04-16 MED ORDER — OMEGA-3-ACID ETHYL ESTERS 1 G PO CAPS
2.0000 g | ORAL_CAPSULE | Freq: Two times a day (BID) | ORAL | Status: DC
  Administered 2012-04-16 – 2012-04-19 (×5): 2 g via ORAL
  Filled 2012-04-16 (×7): qty 2

## 2012-04-16 MED ORDER — VALACYCLOVIR HCL 500 MG PO TABS
500.0000 mg | ORAL_TABLET | Freq: Two times a day (BID) | ORAL | Status: DC
  Administered 2012-04-16 – 2012-04-19 (×6): 500 mg via ORAL
  Filled 2012-04-16 (×8): qty 1

## 2012-04-16 MED ORDER — SODIUM CHLORIDE 0.9 % IJ SOLN
3.0000 mL | Freq: Two times a day (BID) | INTRAMUSCULAR | Status: DC
  Administered 2012-04-16 – 2012-04-18 (×2): 3 mL via INTRAVENOUS

## 2012-04-16 MED ORDER — ACETAMINOPHEN 325 MG PO TABS
650.0000 mg | ORAL_TABLET | Freq: Four times a day (QID) | ORAL | Status: DC | PRN
  Administered 2012-04-16 – 2012-04-19 (×5): 650 mg via ORAL
  Filled 2012-04-16 (×5): qty 2

## 2012-04-16 MED ORDER — ASPIRIN 81 MG PO CHEW
81.0000 mg | CHEWABLE_TABLET | Freq: Every morning | ORAL | Status: DC
  Administered 2012-04-18 – 2012-04-19 (×2): 81 mg via ORAL
  Filled 2012-04-16 (×3): qty 1

## 2012-04-16 MED ORDER — VENLAFAXINE HCL 50 MG PO TABS
50.0000 mg | ORAL_TABLET | Freq: Two times a day (BID) | ORAL | Status: DC
  Administered 2012-04-16 – 2012-04-19 (×6): 50 mg via ORAL
  Filled 2012-04-16 (×7): qty 1

## 2012-04-16 MED ORDER — ACETAMINOPHEN 650 MG RE SUPP: 650.0000 mg | Freq: Four times a day (QID) | RECTAL | Status: DC | PRN

## 2012-04-16 MED ORDER — FERROUS SULFATE 325 (65 FE) MG PO TABS
325.0000 mg | ORAL_TABLET | Freq: Two times a day (BID) | ORAL | Status: DC
  Administered 2012-04-16 – 2012-04-19 (×5): 325 mg via ORAL
  Filled 2012-04-16 (×7): qty 1

## 2012-04-16 MED ORDER — LOSARTAN POTASSIUM 25 MG PO TABS
25.0000 mg | ORAL_TABLET | Freq: Every morning | ORAL | Status: DC
Start: 2012-04-17 — End: 2012-04-19
  Administered 2012-04-17 – 2012-04-19 (×3): 25 mg via ORAL
  Filled 2012-04-16 (×3): qty 1

## 2012-04-16 MED ORDER — NIACIN 500 MG PO TABS
1500.0000 mg | ORAL_TABLET | Freq: Every day | ORAL | Status: DC
  Administered 2012-04-16: 1500 mg via ORAL
  Filled 2012-04-16 (×2): qty 3

## 2012-04-16 MED ORDER — HYDROXYZINE HCL 10 MG PO TABS
10.0000 mg | ORAL_TABLET | Freq: Two times a day (BID) | ORAL | Status: DC
  Administered 2012-04-16 – 2012-04-19 (×6): 10 mg via ORAL
  Filled 2012-04-16 (×7): qty 1

## 2012-04-16 MED ORDER — ASPIRIN 325 MG PO TABS
325.0000 mg | ORAL_TABLET | Freq: Every day | ORAL | Status: DC
  Filled 2012-04-16: qty 1

## 2012-04-16 MED ORDER — SIMVASTATIN 40 MG PO TABS
40.0000 mg | ORAL_TABLET | Freq: Every day | ORAL | Status: DC
  Administered 2012-04-16 – 2012-04-18 (×3): 40 mg via ORAL
  Filled 2012-04-16 (×4): qty 1

## 2012-04-16 MED ORDER — ASPIRIN 325 MG PO TABS
325.0000 mg | ORAL_TABLET | Freq: Every day | ORAL | Status: DC
  Administered 2012-04-16: 325 mg via ORAL
  Filled 2012-04-16 (×2): qty 1

## 2012-04-16 MED ORDER — SODIUM BICARBONATE 650 MG PO TABS
650.0000 mg | ORAL_TABLET | Freq: Every morning | ORAL | Status: DC
  Administered 2012-04-18 – 2012-04-19 (×2): 650 mg via ORAL
  Filled 2012-04-16 (×3): qty 1

## 2012-04-16 MED ORDER — PANTOPRAZOLE SODIUM 40 MG PO TBEC
40.0000 mg | DELAYED_RELEASE_TABLET | Freq: Every day | ORAL | Status: DC
  Administered 2012-04-17 – 2012-04-19 (×3): 40 mg via ORAL
  Filled 2012-04-16 (×3): qty 1

## 2012-04-16 MED ORDER — CIPROFLOXACIN HCL 750 MG PO TABS
750.0000 mg | ORAL_TABLET | Freq: Every day | ORAL | Status: AC
  Administered 2012-04-17: 750 mg via ORAL
  Filled 2012-04-16: qty 1

## 2012-04-16 MED ORDER — ONDANSETRON HCL 4 MG PO TABS: 4.0000 mg | ORAL_TABLET | Freq: Four times a day (QID) | ORAL | Status: DC | PRN

## 2012-04-16 MED ORDER — MORPHINE SULFATE 2 MG/ML IJ SOLN: 1.0000 mg | INTRAMUSCULAR | Status: DC | PRN

## 2012-04-16 MED ORDER — DIFLUPREDNATE 0.05 % OP EMUL
1.0000 [drp] | Freq: Three times a day (TID) | OPHTHALMIC | Status: DC
  Administered 2012-04-16 – 2012-04-19 (×6): 1 [drp] via OPHTHALMIC

## 2012-04-16 MED ORDER — HYDROCODONE-ACETAMINOPHEN 5-325 MG PO TABS: 1.0000 | ORAL_TABLET | ORAL | Status: DC | PRN

## 2012-04-16 MED ORDER — GANCICLOVIR 0.15 % OP GEL
1.0000 "application " | Freq: Three times a day (TID) | OPHTHALMIC | Status: DC
  Administered 2012-04-16 – 2012-04-19 (×6): 1 via OPHTHALMIC

## 2012-04-16 MED ORDER — TESTOSTERONE 50 MG/5GM (1%) TD GEL
5.0000 g | Freq: Every day | TRANSDERMAL | Status: DC
  Administered 2012-04-17: 5 g via TRANSDERMAL

## 2012-04-16 MED ORDER — ONDANSETRON HCL 4 MG/2ML IJ SOLN
4.0000 mg | Freq: Four times a day (QID) | INTRAMUSCULAR | Status: DC | PRN
  Administered 2012-04-18: 4 mg via INTRAVENOUS
  Filled 2012-04-16 (×2): qty 2

## 2012-04-16 MED ORDER — SODIUM CHLORIDE 0.9 % IV SOLN
1000.0000 mL | Freq: Once | INTRAVENOUS | Status: AC
  Administered 2012-04-16: 1000 mL via INTRAVENOUS

## 2012-04-16 MED ORDER — FUROSEMIDE 20 MG PO TABS
20.0000 mg | ORAL_TABLET | Freq: Two times a day (BID) | ORAL | Status: DC
  Administered 2012-04-16 – 2012-04-19 (×6): 20 mg via ORAL
  Filled 2012-04-16 (×8): qty 1

## 2012-04-16 MED ORDER — LEVOTHYROXINE SODIUM 175 MCG PO TABS
175.0000 ug | ORAL_TABLET | Freq: Every day | ORAL | Status: DC
  Administered 2012-04-17 – 2012-04-19 (×3): 175 ug via ORAL
  Filled 2012-04-16 (×4): qty 1

## 2012-04-16 MED ORDER — SODIUM CHLORIDE 0.9 % IV SOLN
INTRAVENOUS | Status: AC
  Administered 2012-04-16 – 2012-04-17 (×2): via INTRAVENOUS

## 2012-04-16 MED ORDER — ONDANSETRON HCL 4 MG/2ML IJ SOLN
4.0000 mg | Freq: Four times a day (QID) | INTRAMUSCULAR | Status: DC | PRN
  Administered 2012-04-17: 4 mg via INTRAVENOUS

## 2012-04-16 NOTE — ED Notes (Signed)
Per EMS, patient has had symptoms on and off for past 3 years-weakness, near syncopal episode this am

## 2012-04-16 NOTE — ED Provider Notes (Signed)
History     CSN: [id number]  Arrival date & time 04/16/12  1352   First MD Initiated Contact with Patient 04/16/12 1442      Chief Complaint  Patient presents with  . general weakness   . near syncopal episode     (Consider location/radiation/quality/duration/timing/severity/associated sxs/prior treatment) HPI Comments: This is a 42 year old male, past medical history remarkable for diabetes, hypothyroidism, CAD, hyperkalemia, who presents emergency department with chief complaint of generalized weakness and a near syncopal episode this morning. Patient states that he was getting ready to go out to the this morning, when he became dizzy. He states that he has been feeling very weak for the past several weeks. He was seen here several days ago, and worked up for shortness of breath. Patient was determined to be low risk for PE, and was discharged home. He tells me that he had a VQ scan on Tuesday, which was negative for PE. Patient states that he has not been eating or drinking. He says that all he wants to sleep. Nothing makes his symptoms better or worse.  The history is provided by the patient. No language interpreter was used.    Past Medical History  Diagnosis Date  . IDDM (insulin dependent diabetes mellitus)   . Retinopathy     x2  . Proteinuria   . Dyslipidemia   . Anemia, iron deficiency On procrit  . Hematuria, microscopic 10/09    work up negative (Dr. Amalia Hailey)  . Diabetes mellitus   . Hypothyroidism   . CKD (chronic kidney disease)   . Low HDL (under 40)   . 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 Hyperkalemia     Past Surgical History  Procedure Laterality Date  . Eye surgery  2006    x2   . Vitrectomy  bilateral  . Insulin pump    .  Colonoscopy    . Esophagogastroduodenoscopy      Family History  Problem Relation Age of Onset  . Diabetes type I Brother   . Prostate cancer Other     History  Substance Use Topics  . Smoking status: Never Smoker   . Smokeless tobacco: Never Used  . Alcohol Use: Yes     Comment: very rarely      Review of Systems  All other systems reviewed and are negative.    Allergies  Sulfa antibiotics; Ramipril; and Versed  Home Medications   Current Outpatient Rx  Name  Route  Sig  Dispense  Refill  . acetaminophen (TYLENOL) 500 MG tablet   Oral   Take 1,000 mg by mouth every 6 (six) hours as needed (pain).          Marland Kitchen aspirin 81 MG chewable tablet   Oral   Chew 81 mg by mouth every morning.         Marland Kitchen aspirin EC 325 MG tablet   Oral   Take 325 mg by mouth every evening. Take with niacin for flushing symptoms .         . ciprofloxacin (CIPRO) 750 MG tablet   Oral   Take 750 mg by mouth every other day.         . Difluprednate (DUREZOL) 0.05 % EMUL   Right Eye   Place 1 drop into the right  eye 3 (three) times daily.         Marland Kitchen epoetin alfa (EPOGEN,PROCRIT) 96295 UNIT/ML injection   Subcutaneous   Inject 20,000 Units into the skin every 30 (thirty) days.         Marland Kitchen esomeprazole (NEXIUM) 40 MG capsule   Oral   Take 40 mg by mouth daily before breakfast.         . ferrous sulfate 325 (65 FE) MG tablet   Oral   Take 325 mg by mouth 2 (two) times daily.         . furosemide (LASIX) 20 MG tablet   Oral   Take 20 mg by mouth 2 (two) times daily.         . Ganciclovir 0.15 % GEL   Right Eye   Place 1 application into the right eye 3 (three) times daily.         . Glucosamine-Chondroit-Vit C-Mn (GLUCOSAMINE 1500 COMPLEX PO)   Oral   Take 2 tablets by mouth.         . hydrOXYzine (ATARAX/VISTARIL) 10 MG tablet   Oral   Take 10 mg by mouth 2 (two) times daily. Itching and rash on right arm         . insulin lispro (HUMALOG) 100 UNIT/ML  injection   Subcutaneous   Inject into the skin continuous. Pt.  Uses insulin via insulin pump .         . levothyroxine (SYNTHROID, LEVOTHROID) 175 MCG tablet   Oral   Take 175 mcg by mouth daily.         Marland Kitchen losartan (COZAAR) 25 MG tablet   Oral   Take 25 mg by mouth every morning.         . niacin 500 MG tablet   Oral   Take 1,500 mg by mouth every evening. Take with adult size aspirin.         Marland Kitchen omega-3 acid ethyl esters (LOVAZA) 1 G capsule   Oral   Take 2 g by mouth 2 (two) times daily.           . simvastatin (ZOCOR) 40 MG tablet   Oral   Take 40 mg by mouth at bedtime.           . sodium bicarbonate 650 MG tablet   Oral   Take 650 mg by mouth daily.         Marland Kitchen testosterone (ANDROGEL) 50 MG/5GM GEL   Transdermal   Place 5 g onto the skin daily.          . valACYclovir (VALTREX) 500 MG tablet   Oral   Take 500 mg by mouth 2 (two) times daily.         Marland Kitchen venlafaxine (EFFEXOR) 50 MG tablet   Oral   Take 50 mg by mouth 2 (two) times daily.             BP 127/78  Pulse 120  Temp(Src) 98.1 F (36.7 C)  SpO2 95%  Physical Exam  Nursing note and vitals reviewed. Constitutional: He is oriented to person, place, and time. He appears well-developed and well-nourished.  HENT:  Head: Normocephalic and atraumatic.  Eyes: Conjunctivae and EOM are normal. Pupils are equal, round, and reactive to light. Right eye exhibits no discharge. Left eye exhibits no discharge. No scleral icterus.  Neck: Normal range of motion. Neck supple. No JVD present.  Cardiovascular: Regular rhythm, normal heart sounds and intact distal pulses.  Exam reveals no gallop and no friction rub.   No murmur heard. Tachycardic  Pulmonary/Chest: Effort normal and breath sounds normal. No respiratory distress. He has no wheezes. He has no rales. He exhibits no tenderness.  Abdominal: Soft. Bowel sounds are normal. He exhibits no distension and no mass. There is no tenderness. There  is no rebound and no guarding.  Musculoskeletal: Normal range of motion. He exhibits no edema and no tenderness.  Neurological: He is alert and oriented to person, place, and time. He has normal reflexes.  CN 3-12 intact  Skin: Skin is warm and dry.  Psychiatric: He has a normal mood and affect. His behavior is normal. Judgment and thought content normal.    ED Course  Procedures (including critical care time)  Labs Reviewed  GLUCOSE, CAPILLARY - Abnormal; Notable for the following:    Glucose-Capillary 364 (*)    All other components within normal limits  URINALYSIS, ROUTINE W REFLEX MICROSCOPIC  CBC WITH DIFFERENTIAL  BASIC METABOLIC PANEL   Results for orders placed during the hospital encounter of 04/16/12  GLUCOSE, CAPILLARY      Result Value Range   Glucose-Capillary 364 (*) 70 - 99 mg/dL  URINALYSIS, ROUTINE W REFLEX MICROSCOPIC      Result Value Range   Color, Urine YELLOW  YELLOW   APPearance CLEAR  CLEAR   Specific Gravity, Urine 1.017  1.005 - 1.030   pH 5.0  5.0 - 8.0   Glucose, UA 500 (*) NEGATIVE mg/dL   Hgb urine dipstick NEGATIVE  NEGATIVE   Bilirubin Urine NEGATIVE  NEGATIVE   Ketones, ur NEGATIVE  NEGATIVE mg/dL   Protein, ur NEGATIVE  NEGATIVE mg/dL   Urobilinogen, UA 0.2  0.0 - 1.0 mg/dL   Nitrite NEGATIVE  NEGATIVE   Leukocytes, UA NEGATIVE  NEGATIVE  CBC WITH DIFFERENTIAL      Result Value Range   WBC 5.7  4.0 - 10.5 K/uL   RBC 4.33  4.22 - 5.81 MIL/uL   Hemoglobin 12.8 (*) 13.0 - 17.0 g/dL   HCT 37.2 (*) 39.0 - 52.0 %   MCV 85.9  78.0 - 100.0 fL   MCH 29.6  26.0 - 34.0 pg   MCHC 34.4  30.0 - 36.0 g/dL   RDW 14.2  11.5 - 15.5 %   Platelets 142 (*) 150 - 400 K/uL   Neutrophils Relative 61  43 - 77 %   Neutro Abs 3.5  1.7 - 7.7 K/uL   Lymphocytes Relative 23  12 - 46 %   Lymphs Abs 1.3  0.7 - 4.0 K/uL   Monocytes Relative 7  3 - 12 %   Monocytes Absolute 0.4  0.1 - 1.0 K/uL   Eosinophils Relative 8 (*) 0 - 5 %   Eosinophils Absolute 0.5  0.0  - 0.7 K/uL   Basophils Relative 1  0 - 1 %   Basophils Absolute 0.0  0.0 - 0.1 K/uL  BASIC METABOLIC PANEL      Result Value Range   Sodium 131 (*) 135 - 145 mEq/L   Potassium 4.8  3.5 - 5.1 mEq/L   Chloride 96  96 - 112 mEq/L   CO2 22  19 - 32 mEq/L   Glucose, Bld 345 (*) 70 - 99 mg/dL   BUN 46 (*) 6 - 23 mg/dL   Creatinine, Ser 2.08 (*) 0.50 - 1.35 mg/dL   Calcium 9.6  8.4 - 10.5 mg/dL   GFR calc non Af Amer 37 (*) >  90 mL/min   GFR calc Af Amer 43 (*) >90 mL/min  TROPONIN I      Result Value Range   Troponin I <0.30  <0.30 ng/mL  GLUCOSE, CAPILLARY      Result Value Range   Glucose-Capillary 132 (*) 70 - 99 mg/dL   Comment 1 Notify RN     Comment 2 Documented in Chart     Dg Chest 2 View  04/14/2012  *RADIOLOGY REPORT*  Clinical Data: Shortness of breath  CHEST - 2 VIEW  Comparison:  April 13, 2012  Findings: The mild left base atelectasis has cleared.  Currently, lungs are clear.  Heart size and pulmonary vascularity are normal. No adenopathy.  No bone lesions.  IMPRESSION: Lungs now clear.   Original Report Authenticated By: Lowella Grip, M.D.    Nm Pulmonary Perf And Vent  04/14/2012  *RADIOLOGY REPORT*  Clinical Data: Shortness of breath  NM PULMONARY VENTILATION AND PERFUSION SCAN  Views:  Anterior, posterior, left lateral, right lateral, RPO, LPO, RAO, LAO - ventilation and perfusion  Radiopharmaceutical: Technetium 39m DTPA - ventilation; technetium 58m macroaggregated albumin - perfusion  Dose:  35.0 mCi - ventilation; 6.0 mCi - perfusion  Route of administration:  Inhalation - ventilation; intravenous - perfusion  Comparison: Chest radiograph April 13, 2012  Findings:  The ventilation study shows homogeneous and symmetric uptake of radiotracer bilaterally.  The perfusion study shows homogeneous and symmetric uptake of radiotracer bilaterally.  There is no appreciable ventilation / perfusion mismatch.  IMPRESSION: Normal ventilation and perfusion lung scans.  Very low  probability of pulmonary embolus.   Original Report Authenticated By: Lowella Grip, M.D.    Dg Chest Port 1 View  04/13/2012  *RADIOLOGY REPORT*  Clinical Data: Shortness of breath  PORTABLE CHEST - 1 VIEW  Comparison: 06/26/2011  Findings: Hypoaeration.  Mild left lung base opacity.  No pleural effusion or pneumothorax.  Cardiomediastinal contours within normal range.  No acute osseous finding.  IMPRESSION: Mild left lung base opacity; atelectasis versus infiltrate.   Original Report Authenticated By: Carlos Levering, M.D.      ED ECG REPORT  I personally interpreted this EKG   Date: 04/16/2012   Rate: 116  Rhythm: sinus tachycardia  QRS Axis: right  Intervals: normal  ST/T Wave abnormalities: normal  Conduction Disutrbances:none  Narrative Interpretation:   Old EKG Reviewed: none available    1. Dyspnea   2. Tachycardia   3. Dyspnea on exertion   4. Hypothyroid       MDM  42 year old male with generalized weakness and near syncopal episode. Recently worked up for shortness of breath, and also had a VQ scan last Tuesday, which was negative. Patient is slightly tachycardic now, but states that he has not been eating or drinking. He also states that he has a quick heart rate. I will give him fluids, and reevaluate, otherwise his low risk for PE per Wells criteria and negative VQ scan this week. States that he has had difficulty controlling his thyroid, suspects that this is probably the source of his symptoms. Will check basic labs, and will likely refer back to primary care.   4:25 PM Patient seen by and discussed with Dr. Jeanell Sparrow.  Will order troponin, as this SOB could be anginal equivalent given the patient's diabetes history.  6:08 PM Spoke with Dr. Percival Spanish from [facility] cardiology, who will consult on the patient in the emergency department. Disposition pending cardiology consultation.  8:29 PM Cardiologist not believe the tachycardia  to be cardiac in nature, recommends  admitting to the hospital to medicine, to rule out other causes of tachycardia. Have discussed the patient with the hospitalist service, who will admit the patient.       Montine Circle, PA-C 04/16/12 2029

## 2012-04-16 NOTE — H&P (Signed)
CARDIOLOGY ADMISSION NOTE  Patient ID: Patrick Brown MRN: [id number] DOB/AGE: 1970-07-15 42 y.o.  Admit date: 04/16/2012 Primary Physician   Redge Gainer, MD Primary Cardiologist   None Chief Complaint    Dyspnea  HPI:  The patient has no history of CAD but he does have history of IDDM.  He was seen in the ER on 3/9 for dyspnea.  The etiology was not clear.  VQ was negative for PE.   He presented to the ER again today with a complaint of weakness, presyncope.  He has had decreased appetite and decreased PO intake.  He reports fatigue and hypersomnolence.  We are asked to see as the ED MD thought symptoms including dyspnea might represent an anginal equivalent.  He has had routine ETTs with the last being last year.  I did see an normal echo from 2012.  He reports a more recent one at [place] last year.  He has no prior cardiac diagnosis although he has had sinus tachycardia.  He is being treated for hypothyroidism and evaluated for adrenal insufficiency.  He reports SOB last Friday but he says this was only a minor symptoms today after he became presyncope.  This happened after he stood up and walked a short distance.  He had no LOC.  He did not have acute dyspnea until after the event and had no chest or jaw pain. He reports profound weakness, anorexia that is getting progressively worse of days/weeks. He has no PND or orthopnea.    Past Medical History  Diagnosis Date  . IDDM (insulin dependent diabetes mellitus)   . Retinopathy     x2  . Proteinuria   . Dyslipidemia   . Anemia, iron deficiency On procrit  . Hematuria, microscopic 10/09    work up negative (Dr. Amalia Hailey)  . Diabetes mellitus   . Hypothyroidism   . CKD (chronic kidney disease)   . Low HDL (under 40)   . Hyperkalemia, diminished renal excretion 06/2011 secondary to TMP/SMZ; prior secondary to  ARBS;     Known potassium excretory defect; history of recurrent hyperkalemia due to diabetic renal disease; ACE/ARB  contraindicated; hyperkalemia 06/2011 secondary to TMP-SMZ  . Hyperkalemia     Past Surgical History  Procedure Laterality Date  . Eye surgery  2006    x2   . Vitrectomy  bilateral  . Insulin pump    . Colonoscopy    . Esophagogastroduodenoscopy      Allergies  Allergen Reactions  . Sulfa Antibiotics Other (See Comments)    High potassium  . Ramipril Cough  . Versed (Midazolam)    No current facility-administered medications on file prior to encounter.   Current Outpatient Prescriptions on File Prior to Encounter  Medication Sig Dispense Refill  . acetaminophen (TYLENOL) 500 MG tablet Take 1,000 mg by mouth every 6 (six) hours as needed (pain).       Marland Kitchen aspirin EC 325 MG tablet Take 325 mg by mouth every evening. Take with niacin for flushing symptoms .      . ciprofloxacin (CIPRO) 750 MG tablet Take 750 mg by mouth every other day.      . Difluprednate (DUREZOL) 0.05 % EMUL Place 1 drop into the right eye 3 (three) times daily.      Marland Kitchen epoetin alfa (EPOGEN,PROCRIT) 09811 UNIT/ML injection Inject 20,000 Units into the skin every 30 (thirty) days.      . furosemide (LASIX) 20 MG tablet Take 20 mg by mouth 2 (two)  times daily.      . Ganciclovir 0.15 % GEL Place 1 application into the right eye 3 (three) times daily.      . Glucosamine-Chondroit-Vit C-Mn (GLUCOSAMINE 1500 COMPLEX PO) Take 1 tablet by mouth 2 (two) times daily.       . hydrOXYzine (ATARAX/VISTARIL) 10 MG tablet Take 10 mg by mouth 2 (two) times daily. Itching and rash on right arm      . insulin lispro (HUMALOG) 100 UNIT/ML injection Inject into the skin continuous. Pt.  Uses insulin via insulin pump .      . levothyroxine (SYNTHROID, LEVOTHROID) 175 MCG tablet Take 175 mcg by mouth every morning.       Marland Kitchen losartan (COZAAR) 25 MG tablet Take 25 mg by mouth every morning.      . niacin 500 MG tablet Take 1,500 mg by mouth every evening. Take with adult size aspirin.      Marland Kitchen omega-3 acid ethyl esters (LOVAZA) 1 G capsule  Take 2 g by mouth 2 (two) times daily.        . simvastatin (ZOCOR) 40 MG tablet Take 40 mg by mouth at bedtime.        . sodium bicarbonate 650 MG tablet Take 650 mg by mouth every morning.       . testosterone (ANDROGEL) 50 MG/5GM GEL Place 5 g onto the skin daily.       . valACYclovir (VALTREX) 500 MG tablet Take 500 mg by mouth 2 (two) times daily.      Marland Kitchen venlafaxine (EFFEXOR) 50 MG tablet Take 50 mg by mouth 2 (two) times daily.         History   Social History  . Marital Status: Single    Spouse Name: N/A    Number of Children: N/A  . Years of Education: N/A   Occupational History  . Not on file.   Social History Main Topics  . Smoking status: Never Smoker   . Smokeless tobacco: Never Used  . Alcohol Use: Yes     Comment: very rarely  . Drug Use: No  . Sexually Active: Not Currently   Other Topics Concern  . Not on file   Social History Narrative  . No narrative on file    Family History  Problem Relation Age of Onset  . Diabetes type I Brother   . Prostate cancer Other     ROS:  As stated in the HPI and negative for all other systems.  Physical Exam: Blood pressure 127/78, pulse 120, temperature 98.1 F (36.7 C), SpO2 95.00%.  GENERAL:  Well appearing HEENT:  Pupils equal round and reactive, fundi not visualized, oral mucosa unremarkable NECK:  No jugular venous distention, waveform within normal limits, carotid upstroke brisk and symmetric, no bruits, no thyromegaly LYMPHATICS:  No cervical, inguinal adenopathy LUNGS:  Clear to auscultation bilaterally BACK:  No CVA tenderness CHEST:  Unremarkable HEART:  PMI not displaced or sustained,S1 and S2 within normal limits, no S3, no S4, no clicks, no rubs, no murmurs ABD:  Flat, positive bowel sounds normal in frequency in pitch, no bruits, no rebound, no guarding, no midline pulsatile mass, no hepatomegaly, no splenomegaly EXT:  2 plus pulses throughout, no edema, no cyanosis no clubbing SKIN:  No rashes no  nodules NEURO:  Cranial nerves II through XII grossly intact, motor grossly intact throughout PSYCH:  Cognitively intact, oriented to person place and time  Labs: Lab Results  Component Value Date  BUN 46* 04/16/2012   Lab Results  Component Value Date   CREATININE 2.08* 04/16/2012   Lab Results  Component Value Date   NA 131* 04/16/2012   K 4.8 04/16/2012   CL 96 04/16/2012   CO2 22 04/16/2012   Lab Results  Component Value Date   TROPONINI <0.30 04/16/2012   Lab Results  Component Value Date   WBC 5.7 04/16/2012   HGB 12.8* 04/16/2012   HCT 37.2* 04/16/2012   MCV 85.9 04/16/2012   PLT 142* 04/16/2012      Radiology:  CXR:  Lungs clear.   EKG:  Sinus tachycardia rate 110.  RAD, poor anterior R wave progression.  No acute ST T wave changes. Nonspecific inferior T wave inversion. No change from previous.  04/16/2012  ASSESSMENT AND PLAN:    1)  Dyspnea:  This is not the predominant complaint during our discussion.  I see no indication of an ACS or unstable angina.  I would however, as an out patient, given the dyspnea the other day and 30 years of diabetes plan an out patient stress perfusion study.  I would check a proBNP as well.    2)  Hypothyroidism:  He reports having recent problems with his TSH and adrenal .  Symptoms of anorexia, dizziness could represent adrenal insufficiency.  I have discussed this possibility with the ER doctor  3) Orthostasis:  He likely has orthostatic hypotension from autonomic dysfunction, dehydration and possibly endocrine dysfunction.  I will check orthstatic BPs.    SignedMinus Breeding 04/16/2012, 7:04 PM

## 2012-04-16 NOTE — H&P (Addendum)
[facility] History and Physical  Patrick Brown [id number] DOB: 09-Jul-1970 DOA: 04/16/2012  Referring physician: ER physician PCP: Redge Gainer, MD   Chief Complaint: lightheadedness, almost passing out  HPI:  42 year old male with past medical history significant for DM (A1c in 06/2011 6.5),CKD stage 3,  hypothyroidism, hyperkalemia (due to ACEi), recent ED admission 04/13/12 for shortness of breath who presented to [facility] ED 04/16/12 with complaints of almost passing out on the day of the admission. Patient reported feeling dizzy, lightheaded and weak prior to almost passing out. He sat down and has never lost consciousness. Patient reports having mild shortness of breath but no other prodromal symptoms such as palpitations, chest pain, blurry vision. No reports of abdominal pain. He did have nausea and shortness of breath after near syncope. No reports of fever or chills or cough. No reports of diarrhea or constipation. No reports of blood in stool or urine. On his recent admission to ED for shortness of breath he had V/Q scan and it showed low probability for pulmonary embolism. In ED, patient's vital signs were stable. Further evaluation included CXR which was normal. His BMET revealed mild hyponatremia of 131 and creatinine of 2.08 (in 06/2011 creatinine 2.10). CBC revealed hemoglobin of 12.8. Patient was started on IV fluids in ED and cardiology was consulted. Recommendation was to obtain pro BNP and outpatient cardio follow up.  Assessment and Plan:  Principal Problem:   *Near syncope, weakness  Unclear etiology, possible vasovagal due to dehydration  1 set of cardiac enzymes is negative. Cycle cardiac enzymes. Follow up 2 D ECHO and pro BNP. Check TSH.  [facility] cardiology consultation and their recommendations.  Check UDS and ethanol level.  Continue IV fluids.  Follow up PT evaluation Active Problems:   Chronic kidney disease, stage 3, mod decreased  GFR  Creatinine appears to be at baseline  Continue sodium bicarbonate Hypertension  BP at goal, continue losartan   DM (diabetes mellitus), type 1, uncontrolled  On insulin pump   Hypothyroid  Continue levothyroxine   Anemia, iron deficiency  Hemoglobin stable at 12.8 on admission. No signs of acute bleed. Continue ferrous sulfate. Skin rash  Finish one more dose of cipro in am  Code Status: Full Family Communication: Pt at bedside Disposition Plan: Admit for further evaluation  Leisa Lenz, MD  [facility] Pager [phone]  If 7PM-7AM, please contact night-coverage [email or web address] Password [email or web address] 04/16/2012, 8:56 PM   Review of Systems:  Constitutional: Negative for fever, chills and positive for malaise/fatigue. Negative for diaphoresis.  HENT: Negative for hearing loss, ear pain, nosebleeds, congestion, sore throat, neck pain, tinnitus and ear discharge.   Eyes: Negative for blurred vision, double vision, photophobia, pain, discharge and redness.  Respiratory: Negative for cough, hemoptysis, sputum production, has mild shortness of breath, no wheezing and stridor.   Cardiovascular: Negative for chest pain, palpitations, orthopnea, claudication and leg swelling.  Gastrointestinal: Negative for nausea, vomiting and abdominal pain. Negative for heartburn, constipation, blood in stool and melena.  Genitourinary: Negative for dysuria, urgency, frequency, hematuria and flank pain.  Musculoskeletal: Negative for myalgias, back pain, joint pain and falls.  Skin: Negative for itching and rash.  Neurological: positive for weakness, lightheadedness. No numbness or tingling sensation. No tremors. No falls. Endo/Heme/Allergies: Negative for environmental allergies and polydipsia. Does not bruise/bleed easily.  Psychiatric/Behavioral: Negative for suicidal ideas. The patient is not nervous/anxious.      Past Medical History  Diagnosis Date  . IDDM (insulin dependent diabetes mellitus)  38 years  . Retinopathy     x2  . Proteinuria   . Dyslipidemia   . Anemia, iron deficiency On procrit  . Hematuria, microscopic 10/09    work up negative (Dr. Amalia Hailey)  . Hypothyroidism   . CKD (chronic kidney disease)   . Low HDL (under 40)   . 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 Hyperkalemia    Past Surgical History  Procedure Laterality Date  . Eye surgery  2006    x2   . Vitrectomy  bilateral  . Insulin pump    . Colonoscopy    . Esophagogastroduodenoscopy     Social History:  reports that he has never smoked. He has never used smokeless tobacco. He reports that  drinks alcohol. He reports that he does not use illicit drugs.  Allergies  Allergen Reactions  . Sulfa Antibiotics Other (See Comments)    High potassium  . Ramipril Cough  . Versed (Midazolam)     Family History:  Family History  Problem Relation Age of Onset  . Diabetes type I Brother   . Prostate cancer Other      Prior to Admission medications   Medication Sig Start Date End Date Taking? Authorizing Provider  acetaminophen (TYLENOL) 500 MG tablet Take 1,000 mg by mouth every 6 (six) hours as needed (pain).    Yes Historical Provider, MD  aspirin 81 MG chewable tablet Chew 81 mg by mouth every morning.   Yes Historical Provider, MD  aspirin EC 325 MG tablet Take 325 mg by mouth every evening. Take with niacin for flushing symptoms .   Yes Historical Provider, MD  ciprofloxacin (CIPRO) 750 MG tablet Take 750 mg by mouth every other day.   Yes Historical Provider, MD  Difluprednate (DUREZOL) 0.05 % EMUL Place 1 drop into the right eye 3 (three) times daily.   Yes Historical Provider, MD  epoetin alfa (EPOGEN,PROCRIT) 16109 UNIT/ML injection Inject 20,000 Units into the skin  every 30 (thirty) days.   Yes Historical Provider, MD  esomeprazole (NEXIUM) 40 MG capsule Take 40 mg by mouth daily before breakfast.   Yes Historical Provider, MD  ferrous sulfate 325 (65 FE) MG tablet Take 325 mg by mouth 2 (two) times daily.   Yes Historical Provider, MD  furosemide (LASIX) 20 MG tablet Take 20 mg by mouth 2 (two) times daily.   Yes Historical Provider, MD  Ganciclovir 0.15 % GEL Place 1 application into the right eye 3 (three) times daily.   Yes Historical Provider, MD  Glucosamine-Chondroit-Vit C-Mn (GLUCOSAMINE 1500 COMPLEX PO) Take 1 tablet by mouth 2 (two) times daily.    Yes Historical Provider, MD  hydrOXYzine (ATARAX/VISTARIL) 10 MG tablet Take 10 mg by mouth 2 (two) times daily. Itching and rash on right arm   Yes Historical Provider, MD  insulin lispro (HUMALOG) 100 UNIT/ML injection Inject into the skin continuous. Pt.  Uses insulin via insulin pump .   Yes Historical Provider, MD  levothyroxine (SYNTHROID, LEVOTHROID) 175 MCG tablet Take 175 mcg by mouth every morning.    Yes Historical Provider, MD  losartan (COZAAR) 25 MG tablet Take 25 mg by mouth every morning.   Yes Historical Provider, MD  niacin 500 MG tablet Take 1,500 mg by mouth every evening. Take with adult size aspirin.  Yes Historical Provider, MD  omega-3 acid ethyl esters (LOVAZA) 1 G capsule Take 2 g by mouth 2 (two) times daily.     Yes Historical Provider, MD  simvastatin (ZOCOR) 40 MG tablet Take 40 mg by mouth at bedtime.     Yes Historical Provider, MD  sodium bicarbonate 650 MG tablet Take 650 mg by mouth every morning.    Yes Historical Provider, MD  sodium polystyrene (KAYEXALATE) 15 GM/60ML suspension Take 30 g by mouth 3 (three) times a week. Tues, thur, and saturdays   Yes Historical Provider, MD  testosterone (ANDROGEL) 50 MG/5GM GEL Place 5 g onto the skin daily.    Yes Historical Provider, MD  valACYclovir (VALTREX) 500 MG tablet Take 500 mg by mouth 2 (two) times daily.   Yes  Historical Provider, MD  venlafaxine (EFFEXOR) 50 MG tablet Take 50 mg by mouth 2 (two) times daily.     Yes Historical Provider, MD   Physical Exam: Filed Vitals:   04/16/12 1952 04/16/12 1954 04/16/12 1955 04/16/12 2053  BP: 128/91 125/85 124/81 123/78  Pulse: 95 109 112   Temp:      Resp:    18  SpO2:    100%    Physical Exam  Constitutional: Appears well-developed and well-nourished. No distress.  HENT: Normocephalic. External right and left ear normal. Oropharynx is clear and moist.  Eyes: Conjunctivae and EOM are normal. PERRLA, no scleral icterus.  Neck: Normal ROM. Neck supple. No JVD. No tracheal deviation. No thyromegaly.  CVS: RRR, S1/S2 +, no murmurs, no gallops, no carotid bruit.  Pulmonary: Effort and breath sounds normal, no stridor, rhonchi, wheezes, rales.  Abdominal: Soft. BS +,  no distension, tenderness, rebound or guarding.  Musculoskeletal: Normal range of motion. No edema and no tenderness.  Lymphadenopathy: No lymphadenopathy noted, cervical, inguinal. Neuro: Alert. Normal reflexes, muscle tone coordination. No cranial nerve deficit. Skin: Skin is warm and dry. No rash noted. Not diaphoretic. No erythema. No pallor.  Psychiatric: Normal mood and affect. Behavior, judgment, thought content normal.   Labs on Admission:  Basic Metabolic Panel:  Recent Labs Lab 04/13/12 0125 04/16/12 1510  NA 134* 131*  K 4.6 4.8  CL 97 96  CO2 25 22  GLUCOSE 342* 345*  BUN 46* 46*  CREATININE 1.87* 2.08*  CALCIUM 9.1 9.6   Liver Function Tests: No results found for this basename: AST, ALT, ALKPHOS, BILITOT, PROT, ALBUMIN,  in the last 168 hours No results found for this basename: LIPASE, AMYLASE,  in the last 168 hours No results found for this basename: AMMONIA,  in the last 168 hours CBC:  Recent Labs Lab 04/16/12 1510  WBC 5.7  NEUTROABS 3.5  HGB 12.8*  HCT 37.2*  MCV 85.9  PLT 142*   Cardiac Enzymes:  Recent Labs Lab 04/16/12 1510  TROPONINI  <0.30   BNP: No components found with this basename: POCBNP,  CBG:  Recent Labs Lab 04/16/12 1416 04/16/12 2024  GLUCAP 364* 132*    Radiological Exams on Admission: No results found.  EKG: Normal sinus rhythm, no ST/T wave changes  Time spent: 75 minutes

## 2012-04-17 ENCOUNTER — Inpatient Hospital Stay (HOSPITAL_COMMUNITY): Payer: BC Managed Care – PPO

## 2012-04-17 DIAGNOSIS — R1115 Cyclical vomiting syndrome unrelated to migraine: Secondary | ICD-10-CM

## 2012-04-17 DIAGNOSIS — R0609 Other forms of dyspnea: Secondary | ICD-10-CM

## 2012-04-17 DIAGNOSIS — R0989 Other specified symptoms and signs involving the circulatory and respiratory systems: Secondary | ICD-10-CM

## 2012-04-17 DIAGNOSIS — R1112 Projectile vomiting: Secondary | ICD-10-CM

## 2012-04-17 LAB — CBC
Hemoglobin: 10.8 g/dL — ABNORMAL LOW (ref 13.0–17.0)
MCH: 29.1 pg (ref 26.0–34.0)
RBC: 3.71 MIL/uL — ABNORMAL LOW (ref 4.22–5.81)

## 2012-04-17 LAB — COMPREHENSIVE METABOLIC PANEL
AST: 29 U/L (ref 0–37)
Albumin: 2.7 g/dL — ABNORMAL LOW (ref 3.5–5.2)
CO2: 21 mEq/L (ref 19–32)
Calcium: 8.9 mg/dL (ref 8.4–10.5)
Creatinine, Ser: 1.81 mg/dL — ABNORMAL HIGH (ref 0.50–1.35)
GFR calc non Af Amer: 44 mL/min — ABNORMAL LOW (ref >90)
Total Protein: 6.2 g/dL (ref 6.0–8.3)

## 2012-04-17 LAB — RAPID URINE DRUG SCREEN, HOSP PERFORMED
Amphetamines: NOT DETECTED
Opiates: NOT DETECTED

## 2012-04-17 LAB — TSH: TSH: 1.731 u[IU]/mL (ref 0.350–4.500)

## 2012-04-17 LAB — TROPONIN I: Troponin I: <0.3 ng/mL (ref <0.30)

## 2012-04-17 LAB — GLUCOSE, CAPILLARY: Glucose-Capillary: 121 mg/dL — ABNORMAL HIGH (ref 70–99)

## 2012-04-17 LAB — HEMOGLOBIN A1C: Mean Plasma Glucose: 180 mg/dL — ABNORMAL HIGH (ref <117)

## 2012-04-17 LAB — CORTISOL: Cortisol, Plasma: 27.7 ug/dL

## 2012-04-17 MED ORDER — INSULIN PUMP
Freq: Three times a day (TID) | SUBCUTANEOUS | Status: DC
  Administered 2012-04-17: 9 via SUBCUTANEOUS
  Administered 2012-04-17: 5 via SUBCUTANEOUS
  Administered 2012-04-17 – 2012-04-18 (×2): 3 via SUBCUTANEOUS
  Administered 2012-04-18: 8 via SUBCUTANEOUS
  Administered 2012-04-18 – 2012-04-19 (×2): via SUBCUTANEOUS
  Filled 2012-04-17: qty 1

## 2012-04-17 MED ORDER — METOCLOPRAMIDE HCL 5 MG PO TABS
5.0000 mg | ORAL_TABLET | Freq: Three times a day (TID) | ORAL | Status: DC
  Administered 2012-04-17 – 2012-04-19 (×5): 5 mg via ORAL
  Filled 2012-04-17 (×8): qty 1

## 2012-04-17 MED ORDER — TECHNETIUM TC 99M SULFUR COLLOID
2.2000 | Freq: Once | INTRAVENOUS | Status: AC | PRN
  Administered 2012-04-17: 2.2 via INTRAVENOUS

## 2012-04-17 NOTE — Progress Notes (Signed)
PT Cancellation Note  Patient Details Name: Patrick [name] MRN: [id number] DOB: [birth year]   Cancelled Treatment:    Reason Eval/Treat Not Completed: Medical issues which prohibited therapy N/V earlier. Will see in AM.    Claretha Cooper 04/17/2012, 4:53 PM

## 2012-04-17 NOTE — Progress Notes (Signed)
Inpatient Diabetes Program Recommendations  AACE/ADA: New Consensus Statement on Inpatient Glycemic Control (2013)  Target Ranges:  Prepandial:   less than 140 mg/dL      Peak postprandial:   less than 180 mg/dL (1-2 hours)      Critically ill patients:  140 - 180 mg/dL   Reason for Visit: Consult - Insulin Pump  42 year old male with past medical history significant for DM (A1c in 06/2011 6.5),CKD stage 3, hypothyroidism, hyperkalemia (due to ACEi), recent ED admission 04/13/12 for shortness of breath who presented to [name] ED 04/16/12 with complaints of almost passing out on the day of the admission. Patient reported feeling dizzy, lightheaded and weak prior to almost passing out. He sat down and has never lost consciousness.  Pt had projectile vomiting this morning.  Feeling somewhat better. Hx Type 1 DM with insulin pump.  Diagnosed with DM at age 49.  Has had insulin pump x 20 years.  Patient extremely knowledgeable about his diabetes and very skilled with his insulin pump. Counts carbohydrates and boluses self with insulin per pump. Pt signed Contract for Pump and was given sheet to record how many units of insulin he boluses.  Discussed with RN.  Results for OC, LEMMEN (MRN [id number]) as of 04/17/2012 12:53  Ref. Range 04/16/2012 22:20 04/17/2012 03:15  Sodium Latest Range: 135-145 mEq/L 136 135  Potassium Latest Range: 3.5-5.1 mEq/L 4.2 4.6  Chloride Latest Range: 96-112 mEq/L 102 106  CO2 Latest Range: 19-32 mEq/L 21 21  BUN Latest Range: 6-23 mg/dL 40 (H) 39 (H)  Creatinine Latest Range: 0.50-1.35 mg/dL 1.70 (H) 1.81 (H)  Calcium Latest Range: 8.4-10.5 mg/dL 9.0 8.9  GFR calc non Af Amer Latest Range: >90 mL/min 48 (L) 44 (L)  GFR calc Af Amer Latest Range: >90 mL/min 55 (L) 51 (L)  Glucose Latest Range: 70-99 mg/dL 117 (H) 113 (H)  Results for YEHYA, ZILE (MRN [id number]) as of 04/17/2012 12:53  Ref. Range 04/16/2012 14:16 04/16/2012 20:24 04/17/2012 06:29  Glucose-Capillary Latest  Range: 70-99 mg/dL 364 (H) 132 (H) 121 (H)   Blood sugars look good.  Insulin Pump Settings: Basal - MN - 8 am - 1.0 units/hr 8 am - 5pm - 1.6 units/hr 5 pm - MN - 1.7 units/hr Bolus -  CHO ratio 1:8 CF - 1 unit for every 35 mg/dL over 100  Will continue to follow.  Has supplies in room.    Thank you. [name], RD, LDN, CDE Inpatient Diabetes Coordinator [phone]

## 2012-04-17 NOTE — Progress Notes (Signed)
   CARE MANAGEMENT NOTE 04/17/2012  Patient:  Patrick [name], Patrick [name]   Account Number:  [id number]  Date Initiated:  04/17/2012  Documentation initiated by:  Olga Coaster  Subjective/Objective Assessment:   ADMITTED WITH NEAR SYNCOPAL EPISODE     Action/Plan:   PCP: Redge Gainer, MD  LIVES AT [place]   Anticipated DC Date:  04/19/2012   Anticipated DC Plan:  [place]  CM consult       Status of service:  In process, will continue to follow Medicare Important Message given?  NA - LOS <3 / Initial given by admissions (If response is "NO", the following Medicare IM given date fields will be blank) Per UR Regulation:  Reviewed for med. necessity/level of care/duration of stay Comments:  04/17/2012- B CHANDLER RN,BSN,MHA

## 2012-04-17 NOTE — Progress Notes (Signed)
Echocardiogram 2D Echocardiogram has been performed.  [name], Patrick [name], 3:27 PM

## 2012-04-17 NOTE — Progress Notes (Signed)
Nutrition Brief Note  Patient identified on the Malnutrition Screening Tool (MST) Report  Body mass index is 26.82 kg/(m^2). Patient meets criteria for Overweight based on current BMI. Pt reports that he is at his normal body weight of 186 lbs. Pt had lost weight (down to 175 lbs) after his cataract surgery February 2014 but, he has gained the wt back. Pt states he was eating very little due to nausea PTA but, now nausea is resolved.  Current diet order is CHO modified which was just advanced, patient was NPO until now. Encouraged adequate po intake. Pt reports his blood sugars tend to run high. He knows how to follow a diabetic diet but, doesn't follow it. Pt states he does avoid sweets, desserts, and sugar-sweetened beverages but, doesn't like fruits and vegetables. Encouraged and discussed tips for increased intake of vegetables and fruit. Pt has no questions or concerns regarding diet, nutrition, or weight at this time.  Labs and medications reviewed. No nutrition interventions warranted at this time. If nutrition issues arise, please consult RD.   Pryor Ochoa RD, LDN Inpatient Clinical Dietitian Pager: [phone] After Hours Pager: [phone]

## 2012-04-17 NOTE — Progress Notes (Addendum)
[facility] PROGRESS NOTE  TAYLON LAFFITTE [id number] DOB: 29-Sep-1970 DOA: 04/16/2012 PCP: Redge Gainer, MD  Assessment/Plan  Near syncope, weakness  Orthostatics negative Trop neg so far Follow up 2 D ECHO TSH 1.7   Cortisol level pending [place] cardiology consultation and their recommendations.  F/u UDS and ethanol level neg Follow up PT evaluation  Projectile bilious vomiting:  DDx includes mass effect, stroke, gastroparesis, partial bowel obstruction.  Intermittent nausea this week prior to today -  NPO -  Abd XR -  Gastric emptying study -  MRI brain (wo contrast due to decreased GFR)  Chronic kidney disease, stage 3, mod decreased GFR  Creatinine appears to be at baseline  Continue sodium bicarbonate  Hypertension  BP at goal, continue losartan  HLD:  -  D/c niacin as no mortality benefit in setting of statin use.    DM (diabetes mellitus), type 1, A1c near goal previously in EPIC On insulin pump Add on A1c Hypothyroid  Continue levothyroxine TSH at goal Anemia, iron deficiency  Hemoglobin stable at 12.8 on admission. No signs of acute bleed. Continue ferrous sulfate.  Skin rash  Finish one more dose of cipro in am   Diet:  NPO Access:  PIV IVF:  NS at 48ml/h Proph:  SCDs  Code Status: full code Family Communication: spoke with patient and his mother Disposition Plan: continue telemetry.     Consultants:  Cardiology  Procedures:  MRI brain pending  KUB pending  Gastric emptying study pending  Antibiotics:  Levofloxacin    HPI/Subjective:  Denies fevers, chills, headache.  Denies chest pain.  + shortness of breath wo wheezing or cough.  + nausea and had episode of sudden projectile bilious emesis this morning during exam. No constipation or diarrhea.    Objective: Filed Vitals:   04/16/12 1955 04/16/12 2053 04/16/12 2142 04/17/12 0542  BP: 124/81 123/78 143/87 116/51  Pulse: 112  98 99  Temp:   98 F (36.7 C) 97.9 F  (36.6 C)  TempSrc:   Oral Oral  Resp:  18 18 18   Height:   5\' 10"  (1.778 m)   Weight:   84.8 kg (186 lb 15.2 oz) 84.8 kg (186 lb 15.2 oz)  SpO2:  100% 98% 99%    Intake/Output Summary (Last 24 hours) at 04/17/12 [id number] Last data filed at 04/17/12 [id number]  Gross per 24 hour  Intake 541.25 ml  Output      0 ml  Net 541.25 ml   Filed Weights   04/16/12 2142 04/17/12 0542  Weight: 84.8 kg (186 lb 15.2 oz) 84.8 kg (186 lb 15.2 oz)    Exam:   General:  CM, No acute distress  HEENT:  NCAT, MMM  Cardiovascular:  RRR, nl S1, S2 no 2/6 systolic murmur at apex, 2+ pulses, warm extremities  Respiratory:   CTAB, no increased WOB  Abdomen:   NABS, soft, NT/ND  MSK:   Normal tone and bulk, no LEE  Neuro:  Grossly intact  Data Reviewed: Basic Metabolic Panel:  Recent Labs Lab 04/13/12 0125 04/16/12 1510 04/16/12 2220 04/17/12 0315  NA 134* 131* 136 135  K 4.6 4.8 4.2 4.6  CL 97 96 102 106  CO2 25 22 21 21   GLUCOSE 342* 345* 117* 113*  BUN 46* 46* 40* 39*  CREATININE 1.87* 2.08* 1.70* 1.81*  CALCIUM 9.1 9.6 9.0 8.9  MG  --   --  1.4*  --   PHOS  --   --  2.5  --    Liver Function Tests:  Recent Labs Lab 04/16/12 2220 04/17/12 0315  AST 30 29  ALT 19 18  ALKPHOS 87 81  BILITOT 0.2* 0.2*  PROT 6.8 6.2  ALBUMIN 2.8* 2.7*   No results found for this basename: LIPASE, AMYLASE,  in the last 168 hours No results found for this basename: AMMONIA,  in the last 168 hours CBC:  Recent Labs Lab 04/16/12 1510 04/16/12 2220 04/17/12 0315  WBC 5.7 5.4 6.2  NEUTROABS 3.5 3.4  --   HGB 12.8* 11.8* 10.8*  HCT 37.2* 34.0* 32.0*  MCV 85.9 85.4 86.3  PLT 142* 146* 139*   Cardiac Enzymes:  Recent Labs Lab 04/16/12 1510 04/16/12 2220 04/17/12 0315  TROPONINI <0.30 <0.30 <0.30   BNP (last 3 results)  Recent Labs  04/16/12 2020 04/16/12 2220  PROBNP 66.5 94.3   CBG:  Recent Labs Lab 04/16/12 1416 04/16/12 2024 04/17/12 0629  GLUCAP 364* 132* 121*     No results found for this or any previous visit (from the past 240 hour(s)).   Studies: No results found.  Scheduled Meds: . aspirin  81 mg Oral q morning - 10a  . aspirin  325 mg Oral QHS  . Difluprednate  1 drop Right Eye TID  . ferrous sulfate  325 mg Oral BID  . furosemide  20 mg Oral BID  . Ganciclovir  1 application Right Eye TID  . hydrOXYzine  10 mg Oral BID  . levothyroxine  175 mcg Oral QAC breakfast  . losartan  25 mg Oral q morning - 10a  . niacin  1,500 mg Oral QHS  . omega-3 acid ethyl esters  2 g Oral BID  . pantoprazole  40 mg Oral Daily  . simvastatin  40 mg Oral QHS  . sodium bicarbonate  650 mg Oral q morning - 10a  . sodium chloride  3 mL Intravenous Q12H  . testosterone  5 g Transdermal Daily  . valACYclovir  500 mg Oral BID  . venlafaxine  50 mg Oral BID   Continuous Infusions: . sodium chloride 1,000 mL (04/16/12 1844)  . sodium chloride 75 mL/hr at 04/17/12 [id number]    Principal Problem:   Near syncope Active Problems:   Chronic kidney disease, stage 3, mod decreased GFR   DM (diabetes mellitus), type 1, uncontrolled   Hypothyroid   Anemia, iron deficiency    Time spent: 30 min    Cashis Rill, [facility] Pager [phone]. If 7PM-7AM, please contact night-coverage at [email or web address], password [facility] 04/17/2012, 9:22 AM  LOS: 1 day

## 2012-04-17 NOTE — Progress Notes (Signed)
    Subjective:  No CP. Doesn't feel any better - states short of breath with moving around in room. No orthopnea or edema. Lightheadedness with standing has improved.  Objective:  Vital Signs in the last 24 hours: Temp:  [97.9 F (36.6 C)-98.1 F (36.7 C)] 97.9 F (36.6 C) (03/14 0542) Pulse Rate:  [95-120] 99 (03/14 0542) Resp:  [18] 18 (03/14 0542) BP: (116-143)/(51-91) 116/51 mmHg (03/14 0542) SpO2:  [95 %-100 %] 99 % (03/14 0542) Weight:  [84.8 kg (186 lb 15.2 oz)] 84.8 kg (186 lb 15.2 oz) (03/14 0542)  Intake/Output from previous day: 03/13 0701 - 03/14 0700 In: 541.3 [I.V.:541.3] Out: -   Physical Exam: Pt is alert and oriented, NAD HEENT: normal Neck: JVP - normal Lungs: CTA bilaterally CV: RRR without murmur or gallop Abd: soft, NT, Positive BS, no hepatomegaly Ext: no C/C/E, distal pulses intact and equal Skin: warm/dry no rash  Lab Results:  Recent Labs  04/16/12 2220 04/17/12 0315  WBC 5.4 6.2  HGB 11.8* 10.8*  PLT 146* 139*    Recent Labs  04/16/12 2220 04/17/12 0315  NA 136 135  K 4.2 4.6  CL 102 106  CO2 21 21  GLUCOSE 117* 113*  BUN 40* 39*  CREATININE 1.70* 1.81*    Recent Labs  04/16/12 2220 04/17/12 0315  TROPONINI <0.30 <0.30    Cardiac Studies: 2D Echo pending.  Tele: CXR: Findings: The mild left base atelectasis has cleared. Currently,  lungs are clear. Heart size and pulmonary vascularity are normal.  No adenopathy. No bone lesions.  IMPRESSION:  Lungs now clear  VQ SCAN: Findings: The ventilation study shows homogeneous and symmetric  uptake of radiotracer bilaterally.  The perfusion study shows homogeneous and symmetric uptake of  radiotracer bilaterally.  There is no appreciable ventilation / perfusion mismatch.  IMPRESSION:  Normal ventilation and perfusion lung scans. Very low probability  of pulmonary embolus.  Assessment/Plan:  Shortness of breath. A bit puzzling since all studies to date are normal.  CXR, VQ scan, troponin, and BNP all WNL. His exam is unrevealing as well. I don't appreciate signs of volume overload. Await 2D Echo today.  Orthostasis. Probably a component of autonomic dysfunction in this patient with longstanding diabetes. He has responded to IV fluids.  Will f/u after echo completed, but if this is unremarkable, will arrange outpatient stress perfusion study as per Dr Hochrein's consult note. Will take care of scheduling this as well as a post-hospital f/u visit.  Sherren Mocha, M.D. 04/17/2012, 8:04 AM

## 2012-04-18 ENCOUNTER — Inpatient Hospital Stay (HOSPITAL_COMMUNITY): Payer: BC Managed Care – PPO

## 2012-04-18 ENCOUNTER — Other Ambulatory Visit: Payer: Self-pay

## 2012-04-18 DIAGNOSIS — J189 Pneumonia, unspecified organism: Principal | ICD-10-CM

## 2012-04-18 DIAGNOSIS — R000 Tachycardia, unspecified: Secondary | ICD-10-CM

## 2012-04-18 DIAGNOSIS — R6510 Systemic inflammatory response syndrome (SIRS) of non-infectious origin without acute organ dysfunction: Secondary | ICD-10-CM

## 2012-04-18 DIAGNOSIS — R918 Other nonspecific abnormal finding of lung field: Secondary | ICD-10-CM

## 2012-04-18 DIAGNOSIS — K3184 Gastroparesis: Secondary | ICD-10-CM

## 2012-04-18 DIAGNOSIS — R509 Fever, unspecified: Secondary | ICD-10-CM

## 2012-04-18 LAB — CBC
HCT: 33.1 % — ABNORMAL LOW (ref 39.0–52.0)
Hemoglobin: 11.3 g/dL — ABNORMAL LOW (ref 13.0–17.0)
MCH: 29 pg (ref 26.0–34.0)
MCV: 84.9 fL (ref 78.0–100.0)
RBC: 3.9 MIL/uL — ABNORMAL LOW (ref 4.22–5.81)

## 2012-04-18 LAB — BASIC METABOLIC PANEL
CO2: 22 mEq/L (ref 19–32)
Glucose, Bld: 158 mg/dL — ABNORMAL HIGH (ref 70–99)
Potassium: 4.3 mEq/L (ref 3.5–5.1)
Sodium: 132 mEq/L — ABNORMAL LOW (ref 135–145)

## 2012-04-18 LAB — INFLUENZA PANEL BY PCR (TYPE A & B)
H1N1 flu by pcr: NOT DETECTED
Influenza A By PCR: NEGATIVE

## 2012-04-18 LAB — STREP PNEUMONIAE URINARY ANTIGEN: Strep Pneumo Urinary Antigen: NEGATIVE

## 2012-04-18 LAB — URINALYSIS, ROUTINE W REFLEX MICROSCOPIC
Glucose, UA: 250 mg/dL — AB
Leukocytes, UA: NEGATIVE
Protein, ur: 30 mg/dL — AB
pH: 5.5 (ref 5.0–8.0)

## 2012-04-18 LAB — HEPATIC FUNCTION PANEL
ALT: 19 U/L (ref 0–53)
AST: 37 U/L (ref 0–37)
Bilirubin, Direct: <0.1 mg/dL (ref 0.0–0.3)

## 2012-04-18 LAB — URINE MICROSCOPIC-ADD ON

## 2012-04-18 LAB — GLUCOSE, CAPILLARY
Glucose-Capillary: 151 mg/dL — ABNORMAL HIGH (ref 70–99)
Glucose-Capillary: 139 mg/dL — ABNORMAL HIGH (ref 70–99)

## 2012-04-18 MED ORDER — AZITHROMYCIN 250 MG PO TABS
250.0000 mg | ORAL_TABLET | Freq: Every day | ORAL | Status: DC
  Administered 2012-04-19: 250 mg via ORAL
  Filled 2012-04-18: qty 1

## 2012-04-18 MED ORDER — AZITHROMYCIN 500 MG PO TABS
500.0000 mg | ORAL_TABLET | Freq: Every day | ORAL | Status: AC
  Administered 2012-04-18: 500 mg via ORAL
  Filled 2012-04-18: qty 1

## 2012-04-18 MED ORDER — PIPERACILLIN-TAZOBACTAM 3.375 G IVPB
3.3750 g | Freq: Three times a day (TID) | INTRAVENOUS | Status: DC
  Administered 2012-04-18 – 2012-04-19 (×2): 3.375 g via INTRAVENOUS
  Filled 2012-04-18 (×4): qty 50

## 2012-04-18 MED ORDER — DEXTROSE 5 % IV SOLN
2.0000 g | INTRAVENOUS | Status: DC
  Administered 2012-04-18: 2 g via INTRAVENOUS
  Filled 2012-04-18: qty 2

## 2012-04-18 MED ORDER — LEVOFLOXACIN IN D5W 750 MG/150ML IV SOLN
750.0000 mg | INTRAVENOUS | Status: DC
  Administered 2012-04-18: 750 mg via INTRAVENOUS
  Filled 2012-04-18: qty 150

## 2012-04-18 MED ORDER — SODIUM CHLORIDE 0.9 % IV SOLN
1000.0000 mL | INTRAVENOUS | Status: DC
  Administered 2012-04-18: 1000 mL via INTRAVENOUS

## 2012-04-18 NOTE — Evaluation (Signed)
Physical Therapy Evaluation Patient Details Name: Patrick Brown MRN: [id number] DOB: [birth year] Today's Date: 04/18/2012 Time: [id number] PT Time Calculation (min): 17 min  PT Assessment / Plan / Recommendation Clinical Impression  42 year old male with past medical history significant for DM (A1c in 06/2011 6.5),CKD stage 3,  hypothyroidism, hyperkalemia (due to ACEi), recent ED admission 04/13/12 for shortness of breath who presented to [facility] ED 04/16/12 with complaints of almost passing out on the day of the admission. Patient reported feeling dizzy, lightheaded and weak prior to almost passing out. He sat down and has never lost consciousness. Patient reports having mild shortness of breath;  ONe time PT eval completed at this time; no further PT needs; recommend pt continue to amb with nsg staff to improve tol to activity     PT Assessment  Patent does not need any further PT services    Follow Up Recommendations  No PT follow up    Does the patient have the potential to tolerate intense rehabilitation      Barriers to Discharge        Equipment Recommendations       Recommendations for Other Services     Frequency      Precautions / Restrictions Precautions Precautions: None Restrictions Weight Bearing Restrictions: No   Pertinent Vitals/Pain Denies pain      Mobility  Bed Mobility Bed Mobility: Supine to Sit Supine to Sit: 6: Modified independent (Device/Increase time) Transfers Transfers: Sit to Stand;Stand to Sit Sit to Stand: 7: Independent Stand to Sit: 7: Independent Ambulation/Gait Ambulation/Gait Assistance: 5: Supervision;6: Modified independent (Device/Increase time) Ambulation Distance (Feet): 130 Feet Assistive device: None Ambulation/Gait Assistance Details: pushed IV portion of gait distance;  Gait Pattern: Within Functional Limits General Gait Details: pt has been getting up to bathroom by himself; no significant WOB noted by PT or reported by pt  with gait    Exercises     PT Diagnosis:    PT Problem List:   PT Treatment Interventions:     PT Goals    Visit Information  Last PT Received On: 04/18/12 Assistance Needed: +1    Subjective Data  Subjective: they think i have the flu Patient Stated Goal: return to previous lifestyle   Prior Functioning  Home Living Lives With: Other (Comment) (Mom) Available Help at Discharge: Family;Available PRN/intermittently Type of Home: House Home Access: Stairs to enter [employer] of Steps: 2 Home Layout: One level Home Adaptive Equipment: None Additional Comments: pt I  prior but weaker than normal and  reports functional decline since cataract surgery (that did not go as planned) 1 mo ago, he was on bedrest x >1 wk after that and progressively declined Prior Function Level of Independence: Independent Able to Take Stairs?: Yes Driving: Yes Communication Communication: No difficulties    Cognition  Cognition Overall Cognitive Status: Appears within functional limits for tasks assessed/performed Arousal/Alertness: Awake/alert Orientation Level: Appears intact for tasks assessed Behavior During Session: [facility] for tasks performed    Extremity/Trunk Assessment     Balance Dynamic Standing Balance Dynamic Standing - Balance Support: No upper extremity supported;Right upper extremity supported;During functional activity Dynamic Standing - Level of Assistance: 5: Stand by assistance  End of Session PT - End of Session Activity Tolerance: Patient tolerated treatment well Patient left: in chair;with call bell/phone within reach  GP     [facility] 04/18/2012, 11:43 AM

## 2012-04-18 NOTE — Progress Notes (Signed)
ANTIBIOTIC CONSULT NOTE - INITIAL  Pharmacy Consult for Zosyn Indication: pneumonia (? aspiration pneumonia)  Allergies  Allergen Reactions  . Sulfa Antibiotics Other (See Comments)    High potassium  . Ramipril Cough  . Versed (Midazolam)     Patient Measurements: Height: 5\' 10"  (177.8 cm) Weight: 184 lb 4.9 oz (83.6 kg) IBW/kg (Calculated) : 73   Vital Signs: Temp: 99.4 F (37.4 C) (03/15 1421) Temp src: Oral (03/15 1421) BP: 120/61 mmHg (03/15 1421) Pulse Rate: 110 (03/15 1421) Intake/Output from previous day: 03/14 0701 - 03/15 0700 In: 1960 [P.O.:360; I.V.:1600] Out: -  Intake/Output from this shift:    Labs:  Recent Labs  04/16/12 2220 04/17/12 0315 04/18/12 0509  WBC 5.4 6.2 6.2  HGB 11.8* 10.8* 11.3*  PLT 146* 139* 154  CREATININE 1.70* 1.81* 1.59*   Estimated Creatinine Clearance: 61.9 ml/min (by C-G formula based on Cr of 1.59). No results found for this basename: VANCOTROUGH, VANCOPEAK, VANCORANDOM, GENTTROUGH, GENTPEAK, GENTRANDOM, TOBRATROUGH, TOBRAPEAK, TOBRARND, AMIKACINPEAK, AMIKACINTROU, AMIKACIN,  in the last 72 hours   Microbiology: No results found for this or any previous visit (from the past 720 hour(s)).  Medical History: Past Medical History  Diagnosis Date  . IDDM (insulin dependent diabetes mellitus)     38 years  . Retinopathy     x2  . Proteinuria   . Dyslipidemia   . Anemia, iron deficiency On procrit  . Hematuria, microscopic 10/09    work up negative (Dr. Amalia Hailey)  . Hypothyroidism   . CKD (chronic kidney disease)   . Low HDL (under 40)   . 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 Hyperkalemia     Assessment: 74 yom admitted 3/13 with lightheadedness, near syncope and projectile bilious  vomiting.  On 3/14, pt with Tmax of 101.7 and CXR worrisome for pneumonia (CAP vs aspiration PNA).  MD started Levaquin this morning and would like to change to Zosyn for aspiration coverage along with Azithromycin.  Of note, patient was on long course of Ciprofloxacin PTA for skin rash.  Scr 1.59, CrCl 62 ml/min  Plan:   Zosyn 3.375gm IV q8h extended infusion over 4 hours  Pharmacy will f/u  Vanessa [place], PharmD, BCPS Pager: [place] #: [phone]

## 2012-04-18 NOTE — Progress Notes (Signed)
SUBJECTIVE:  No complaints of chest pain - had a little SOB this am  OBJECTIVE:   Vitals:   Filed Vitals:   04/17/12 2230 04/18/12 0206 04/18/12 0649 04/18/12 0652  BP: 136/78  137/74   Pulse: 112  108   Temp: 101.7 F (38.7 C) 98.9 F (37.2 C) 100.6 F (38.1 C)   TempSrc: Oral Oral Oral   Resp: 20  20   Height:      Weight:    83.6 kg (184 lb 4.9 oz)  SpO2: 97%  96%    I&O's:   Intake/Output Summary (Last 24 hours) at 04/18/12 0820 Last data filed at 04/18/12 [id number]  Gross per 24 hour  Intake 2964.17 ml  Output      0 ml  Net 2964.17 ml   TELEMETRY: Reviewed telemetry pt in NSR:     PHYSICAL EXAM General: Well developed, well nourished, in no acute distress Head: Eyes PERRLA, No xanthomas.   Normal cephalic and atramatic  Lungs:   Clear bilaterally to auscultation and percussion. Heart:   HRRR S1 S2 Pulses are 2+ & equal. Abdomen: Bowel sounds are positive, abdomen soft and non-tender without masses Extremities:   No clubbing, cyanosis or edema.  DP +1 Neuro: Alert and oriented X 3. Psych:  Good affect, responds appropriately   LABS: Basic Metabolic Panel:  Recent Labs  04/16/12 2220 04/17/12 0315 04/18/12 0509  NA 136 135 132*  K 4.2 4.6 4.3  CL 102 106 100  CO2 21 21 22   GLUCOSE 117* 113* 158*  BUN 40* 39* 34*  CREATININE 1.70* 1.81* 1.59*  CALCIUM 9.0 8.9 9.2  MG 1.4*  --   --   PHOS 2.5  --   --    Liver Function Tests:  Recent Labs  04/16/12 2220 04/17/12 0315  AST 30 29  ALT 19 18  ALKPHOS 87 81  BILITOT 0.2* 0.2*  PROT 6.8 6.2  ALBUMIN 2.8* 2.7*   No results found for this basename: LIPASE, AMYLASE,  in the last 72 hours CBC:  Recent Labs  04/16/12 1510 04/16/12 2220 04/17/12 0315 04/18/12 0509  WBC 5.7 5.4 6.2 6.2  NEUTROABS 3.5 3.4  --   --   HGB 12.8* 11.8* 10.8* 11.3*  HCT 37.2* 34.0* 32.0* 33.1*  MCV 85.9 85.4 86.3 84.9  PLT 142* 146* 139* 154   Cardiac Enzymes:  Recent Labs  04/16/12 1510 04/16/12 2220  04/17/12 0315  TROPONINI <0.30 <0.30 <0.30   BNP: No components found with this basename: POCBNP,  D-Dimer: No results found for this basename: DDIMER,  in the last 72 hours Hemoglobin A1C:  Recent Labs  04/17/12 0315  HGBA1C 7.9*   Fasting Lipid Panel: No results found for this basename: CHOL, HDL, LDLCALC, TRIG, CHOLHDL, LDLDIRECT,  in the last 72 hours Thyroid Function Tests:  Recent Labs  04/16/12 2220  TSH 1.731   Anemia Panel: No results found for this basename: VITAMINB12, FOLATE, FERRITIN, TIBC, IRON, RETICCTPCT,  in the last 72 hours Coag Panel:   Lab Results  Component Value Date   INR 1.06 04/16/2012    RADIOLOGY: Dg Chest 2 View  04/18/2012  *RADIOLOGY REPORT*  Clinical Data: Fever.  Short of breath.  CHEST - 2 VIEW  Comparison: None.  Findings: Hazy bilateral central airspace opacities have developed. Normal heart size.  No pneumothorax.  No pleural effusion. Stable upper thoracic wedge compression deformities.  IMPRESSION: Bilateral perihilar hazy airspace disease worrisome for bronchopneumonia.   Original  Report Authenticated By: Marybelle Killings, M.D.    Dg Chest 2 View  04/14/2012  *RADIOLOGY REPORT*  Clinical Data: Shortness of breath  CHEST - 2 VIEW  Comparison:  April 13, 2012  Findings: The mild left base atelectasis has cleared.  Currently, lungs are clear.  Heart size and pulmonary vascularity are normal. No adenopathy.  No bone lesions.  IMPRESSION: Lungs now clear.   Original Report Authenticated By: Lowella Grip, M.D.    Mr Brain Wo Contrast  04/17/2012  *RADIOLOGY REPORT*  Clinical Data: Near syncope, weakness.  Chronic kidney disease.  MRI HEAD WITHOUT CONTRAST  Technique:  Multiplanar, multiecho pulse sequences of the brain and surrounding structures were obtained according to standard protocol without intravenous contrast.  Comparison: MRI 03/12/2004  Findings:   Ventricle size is normal.  Negative for acute infarct. No significant chronic ischemia.   Cerebral white matter and brainstem have normal signal.  Negative for intracranial hemorrhage or fluid collection.  No mass or edema.  No midline shift.  IMPRESSION: No acute intracranial abnormality.   Original Report Authenticated By: Carl Best, M.D.    Nm Gastric Emptying  04/17/2012  *RADIOLOGY REPORT*  Clinical Data:  Projectile vomiting.  Diabetes.  NUCLEAR MEDICINE GASTRIC EMPTYING SCAN  Technique:  After oral ingestion of radiolabeled meal, sequential abdominal images were obtained for 120 minutes.  Residual percentage of activity remaining within the stomach was calculated at 60 and 120 minutes.  Radiopharmaceutical:  2.2 mCi Tc-21m sulfur colloid.  Comparison:  None.  Findings: Gastric retention at 60 minutes is 67%.  Retention at 120 minutes is 66%.  IMPRESSION: Abnormal gastric emptying study with 66% retention at 2 hours.   Original Report Authenticated By: Lorriane Shire, M.D.    Nm Pulmonary Perf And Vent  04/14/2012  *RADIOLOGY REPORT*  Clinical Data: Shortness of breath  NM PULMONARY VENTILATION AND PERFUSION SCAN  Views:  Anterior, posterior, left lateral, right lateral, RPO, LPO, RAO, LAO - ventilation and perfusion  Radiopharmaceutical: Technetium 61m DTPA - ventilation; technetium 16m macroaggregated albumin - perfusion  Dose:  35.0 mCi - ventilation; 6.0 mCi - perfusion  Route of administration:  Inhalation - ventilation; intravenous - perfusion  Comparison: Chest radiograph April 13, 2012  Findings:  The ventilation study shows homogeneous and symmetric uptake of radiotracer bilaterally.  The perfusion study shows homogeneous and symmetric uptake of radiotracer bilaterally.  There is no appreciable ventilation / perfusion mismatch.  IMPRESSION: Normal ventilation and perfusion lung scans.  Very low probability of pulmonary embolus.   Original Report Authenticated By: Lowella Grip, M.D.    Dg Chest Port 1 View  04/13/2012  *RADIOLOGY REPORT*  Clinical Data: Shortness of breath   PORTABLE CHEST - 1 VIEW  Comparison: 06/26/2011  Findings: Hypoaeration.  Mild left lung base opacity.  No pleural effusion or pneumothorax.  Cardiomediastinal contours within normal range.  No acute osseous finding.  IMPRESSION: Mild left lung base opacity; atelectasis versus infiltrate.   Original Report Authenticated By: Carlos Levering, M.D.    Dg Abd 2 Views  04/17/2012  *RADIOLOGY REPORT*  Clinical Data: Bilious emesis.  ABDOMEN - 2 VIEW  Comparison: None.  Findings: No free air or free fluid in the abdomen.  There is only a minimal amount of air scattered throughout the nondistended bowel.  No worrisome abdominal calcifications.  No osseous abnormality.  IMPRESSION: Benign-appearing abdomen.   Original Report Authenticated By: Lorriane Shire, M.D.       ASSESSMENT:  1.  SOB with chest  xray, troponin, BNP and VQ scan WNL.  2D echo completely normal with no evidence of diastolic dysfunction.  PLAN:   1.  Will plan outpatient nuclear stress test per Dr. Burt Knack and Hochrein's notes.  Sueanne Margarita, MD  04/18/2012  8:20 AM

## 2012-04-18 NOTE — Progress Notes (Signed)
ANTIBIOTIC CONSULT NOTE - INITIAL  Pharmacy Consult for Levaquin Indication: pneumonia  Allergies  Allergen Reactions  . Sulfa Antibiotics Other (See Comments)    High potassium  . Ramipril Cough  . Versed (Midazolam)     Patient Measurements: Height: 5\' 10"  (177.8 cm) Weight: 184 lb 4.9 oz (83.6 kg) IBW/kg (Calculated) : 73 Adjusted Body Weight:   Vital Signs: Temp: 100.6 F (38.1 C) (03/15 0649) Temp src: Oral (03/15 0649) BP: 137/74 mmHg (03/15 0649) Pulse Rate: 108 (03/15 0649) Intake/Output from previous day: 03/14 0701 - 03/15 0700 In: 1960 [P.O.:360; I.V.:1600] Out: -  Intake/Output from this shift: Total I/O In: 1004.2 [I.V.:1004.2] Out: -   Labs:  Recent Labs  04/16/12 2220 04/17/12 0315 04/18/12 0509  WBC 5.4 6.2 6.2  HGB 11.8* 10.8* 11.3*  PLT 146* 139* 154  CREATININE 1.70* 1.81* 1.59*   Estimated Creatinine Clearance: 61.9 ml/min (by C-G formula based on Cr of 1.59). No results found for this basename: VANCOTROUGH, VANCOPEAK, VANCORANDOM, GENTTROUGH, GENTPEAK, GENTRANDOM, TOBRATROUGH, TOBRAPEAK, TOBRARND, AMIKACINPEAK, AMIKACINTROU, AMIKACIN,  in the last 72 hours   Microbiology: No results found for this or any previous visit (from the past 720 hour(s)).  Medical History: Past Medical History  Diagnosis Date  . IDDM (insulin dependent diabetes mellitus)     38 years  . Retinopathy     x2  . Proteinuria   . Dyslipidemia   . Anemia, iron deficiency On procrit  . Hematuria, microscopic 10/09    work up negative (Dr. Amalia Hailey)  . Hypothyroidism   . CKD (chronic kidney disease)   . Low HDL (under 40)   . 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 Hyperkalemia     Medications:  Scheduled:  . aspirin  81 mg Oral q  morning - 10a  . Difluprednate  1 drop Right Eye TID  . ferrous sulfate  325 mg Oral BID  . furosemide  20 mg Oral BID  . Ganciclovir  1 application Right Eye TID  . hydrOXYzine  10 mg Oral BID  . insulin pump   Subcutaneous TID AC, HS, 0200  . levothyroxine  175 mcg Oral QAC breakfast  . losartan  25 mg Oral q morning - 10a  . metoCLOPramide  5 mg Oral TID AC  . omega-3 acid ethyl esters  2 g Oral BID  . pantoprazole  40 mg Oral Daily  . simvastatin  40 mg Oral QHS  . sodium bicarbonate  650 mg Oral q morning - 10a  . sodium chloride  3 mL Intravenous Q12H  . testosterone  5 g Transdermal Daily  . valACYclovir  500 mg Oral BID  . venlafaxine  50 mg Oral BID  . [DISCONTINUED] aspirin  325 mg Oral QHS  . [DISCONTINUED] niacin  1,500 mg Oral QHS   Infusions:  . sodium chloride 1,000 mL (04/16/12 1844)  . [EXPIRED] sodium chloride 75 mL/hr at 04/17/12 0622   PRN: acetaminophen, acetaminophen, HYDROcodone-acetaminophen, morphine injection, ondansetron (ZOFRAN) IV, ondansetron (ZOFRAN) IV, ondansetron, [COMPLETED] technetium sulfur colloid Assessment: 42 yo M with CAP and chronic kidney disease, Stage 3,  Goal of Therapy:  Eradication of infection.  Plan:  To begin with Levaquin 750mg  IV q 24 hours. Monitor renal function Follow up culture results  Gypsy Decant 04/18/2012,9:29 AM

## 2012-04-18 NOTE — Discharge Summary (Signed)
Physician Discharge Summary  Patrick Brown [id number] DOB: 10/25/1970 DOA: 04/16/2012  PCP: Redge Gainer, MD  Admit date: 04/16/2012 Discharge date: 04/19/2012  Recommendations for Outpatient Follow-up:  1. Follow up with primary care within 1 week of discharge.  Consider repeating the gastric emptying study when patient is well as may have been falsely affected by his acute febrile illness.  Follow up urine legionella and final result of blood cultures.    Discharge Diagnoses:  Principal Problem:   Near syncope Active Problems:   Chronic kidney disease, stage 3, mod decreased GFR   DM (diabetes mellitus), type 1, uncontrolled   Hypothyroid   Anemia, iron deficiency   Fever, unspecified   SIRS (systemic inflammatory response syndrome)   CAP (community acquired pneumonia)   Gastroparesis   Sinus tachycardia   Discharge Condition: stable, improved  Diet recommendation: diabetic diet  Wt Readings from Last 3 Encounters:  04/19/12 83.2 kg (183 lb 6.8 oz)  04/12/12 83.462 kg (184 lb)  06/27/11 79.833 kg (176 lb)    History of present illness:   42 year old male with past medical history significant for DM (A1c in 06/2011 6.5),CKD stage 3, hypothyroidism, hyperkalemia (due to ACEi), recent ED admission 04/13/12 for shortness of breath who presented to [facility] ED 04/16/12 with complaints of almost passing out on the day of the admission. Patient reported feeling dizzy, lightheaded and weak prior to almost passing out. He sat down and has never lost consciousness. Patient reports having mild shortness of breath but no other prodromal symptoms such as palpitations, chest pain, blurry vision. No reports of abdominal pain. He did have nausea and shortness of breath after near syncope. No reports of fever or chills or cough. No reports of diarrhea or constipation. No reports of blood in stool or urine. On his recent admission to ED for shortness of breath he had V/Q scan and it showed low  probability for pulmonary embolism.   In ED, patient's vital signs were stable. Further evaluation included CXR which was normal. His BMET revealed mild hyponatremia of 131 and creatinine of 2.08 (in 06/2011 creatinine 2.10). CBC revealed hemoglobin of 12.8.  Patient was started on IV fluids in ED and cardiology was consulted. Recommendation was to obtain pro BNP and outpatient cardio follow up.  Hospital Course:   Near syncope, weakness, may be related to CAP or viral illness.   Orthostatics negative  Trops neg   2 D ECHO with normal EF and no wall motion or valvular abnl.  TSH 1.7  Cortisol level 27  Appreciate cardiology consultation and their recommendations.  UDS neg and ethanol level neg  Pt was evaluated by PT who recommended no equipment or follow up.  Fevers and new infiltrate on CXR, likely CAP versus aspiration pneumonia after vomiting episode the day prior, however, complicated by fact that patient has been on longstanding ciprofloxacin. He was started on zosyn and azithromycin to cover both types of pneumonia and he clinically improved.  He was not SOB walking to bathroom or completing ADLs, however, he did have some persistent cough.   - Blood cultures x 2 NGTD - Flu PCR neg - Continue augmentin and z-pack at home for total of 7 and 5 days respectively. - Urine legionella pending and S. pneumo ag neg - UA neg  Mild abdominal tenderness, likely related to viral illness.  Resolved.  LFTs wnl except for low albumin (APR) and lipase 15 wnl  Projectile bilious vomiting: DDx included mass effect, stroke,  gastroparesis, partial bowel obstruction.  MRI brain (wo contrast due to decreased GFR) unremarkable.  Abd XR wnl.  Gastric emptying study with delayed emptying.  Intermittent nausea, but after starting reglan was able to eat without emesis.  Due to symptoms of viral illness which started soon after completion of GES, the results of the test may have been effected by  gastroenteritis.  Consider repeating GES in 4-6 weeks.    Chronic kidney disease, stage 3, mod decreased GFR.  Creatinine remained at baseline.  Continued Na bicarb.  Hypertension.  BP remained stable.  Patient continued losartan.    HLD:  D/c'd niacin as no mortality benefit in setting of statin use.    DM (diabetes mellitus), type 1, A1c near goal previously in EPIC.  A1c 7.9.  Pt continued insulin pump.    Hypothyroid.  Stable.  TSH at goal.   Continue levothyroxine   Anemia, iron deficiency   Hemoglobin stable at 12.8 on admission. No signs of acute bleed. Continued ferrous sulfate.   Consultants:  Cardiology Procedures:  MRI brain  KUB  Gastric emptying study  Antibiotics:  Ciprofloxacin 3/13 >> 3/14 Levofloxacin 3/15 x 1 Ceftriaxone 3/15 >> 3/15 Zosyn 3/15 >> 3/16 Azithromycin 3/15 >>   Discharge Exam: Filed Vitals:   04/19/12 0443  BP: 118/75  Pulse: 102  Temp: 98.4 F (36.9 C)  Resp: 18   Filed Vitals:   04/18/12 0652 04/18/12 1421 04/18/12 2105 04/19/12 0443  BP:  120/61 142/83 118/75  Pulse:  110 105 102  Temp:  99.4 F (37.4 C) 99.3 F (37.4 C) 98.4 F (36.9 C)  TempSrc:  Oral Oral Oral  Resp:  22 20 18   Height:      Weight: 83.6 kg (184 lb 4.9 oz)   83.2 kg (183 lb 6.8 oz)  SpO2:  97% 97% 95%   States he feels better and less SOB.  Has cough and persistent nausea without vomiting.  No diarrhea.   General: CM, No acute distress  HEENT: NCAT, MMM  Cardiovascular: RRR, nl S1, S2 no 2/6 systolic murmur at apex, 2+ pulses, warm extremities  Respiratory: distant breath sounds on back, clear anteriorly.  No prolonged expiratory phase, no increased WOB  Abdomen: NABS, soft, NT, ND  MSK: Normal tone and bulk, no LEE  Neuro: Grossly intact   Discharge Instructions      Discharge Orders   Future Appointments Provider Department Dept Phone   06/09/2012 8:30 AM Chipper Herb, MD [facility] [phone]   Future Orders  Complete By Expires     Call MD for:  difficulty breathing, headache or visual disturbances  As directed     Call MD for:  extreme fatigue  As directed     Call MD for:  hives  As directed     Call MD for:  persistant dizziness or light-headedness  As directed     Call MD for:  persistant nausea and vomiting  As directed     Call MD for:  severe uncontrolled pain  As directed     Call MD for:  temperature >100.4  As directed     Diet - low sodium heart healthy  As directed     Diet Carb Modified  As directed     Discharge instructions  As directed     Comments:      You were hospitalized with a near fainting spell.  Your tests for heart attack and heart rhythm problems were  negative.  Your heart structure and function were normal.  You were found to have pneumonia, symptoms of a viral illness, and possible gastroparesis.  You were started on antibiotics for community acquired and aspiration pneumonia.  It is normal to have fevers for up to 5-7 days with pneumonia or gastroenteritis even on appropriate antibiotics, but generally the fever trends down.  Your next dose of augmentin is tonight.  You next dose of azithromycin is early this afternoon.  Please follow up with your primary care doctor in 1-2 weeks or sooner as needed.  If you feel more Daaiyah Baumert of breath, please return to the emergency department.    Increase activity slowly  As directed         Medication List    STOP taking these medications       aspirin EC 325 MG tablet     ciprofloxacin 750 MG tablet  Commonly known as:  CIPRO     niacin 500 MG tablet      TAKE these medications       acetaminophen 500 MG tablet  Commonly known as:  TYLENOL  Take 1,000 mg by mouth every 6 (six) hours as needed (pain).     amoxicillin-clavulanate 875-125 MG per tablet  Commonly known as:  AUGMENTIN  Take 1 tablet by mouth 2 (two) times daily.     aspirin 81 MG chewable tablet  Chew 81 mg by mouth every morning.     azithromycin 250  MG tablet  Commonly known as:  ZITHROMAX  Take 1 tablet (250 mg total) by mouth daily.     DUREZOL 0.05 % Emul  Generic drug:  Difluprednate  Place 1 drop into the right eye 3 (three) times daily.     EFFEXOR 50 MG tablet  Generic drug:  venlafaxine  Take 50 mg by mouth 2 (two) times daily.     epoetin alfa 20000 UNIT/ML injection  Commonly known as:  EPOGEN,PROCRIT  Inject 20,000 Units into the skin every 30 (thirty) days.     esomeprazole 40 MG capsule  Commonly known as:  NEXIUM  Take 40 mg by mouth daily before breakfast.     ferrous sulfate 325 (65 FE) MG tablet  Take 325 mg by mouth 2 (two) times daily.     furosemide 20 MG tablet  Commonly known as:  LASIX  Take 20 mg by mouth 2 (two) times daily.     Ganciclovir 0.15 % Gel  Place 1 application into the right eye 3 (three) times daily.     GLUCOSAMINE 1500 COMPLEX PO  Take 1 tablet by mouth 2 (two) times daily.     HUMALOG 100 UNIT/ML injection  Generic drug:  insulin lispro  Inject into the skin continuous. Pt.  Uses insulin via insulin pump .     hydrOXYzine 10 MG tablet  Commonly known as:  ATARAX/VISTARIL  Take 10 mg by mouth 2 (two) times daily. Itching and rash on right arm     levothyroxine 175 MCG tablet  Commonly known as:  SYNTHROID, LEVOTHROID  Take 175 mcg by mouth every morning.     losartan 25 MG tablet  Commonly known as:  COZAAR  Take 25 mg by mouth every morning.     metoCLOPramide 5 MG tablet  Commonly known as:  REGLAN  Take 1 tablet (5 mg total) by mouth 3 (three) times daily before meals.     omega-3 acid ethyl esters 1 G capsule  Commonly known as:  LOVAZA  Take 2 g by mouth 2 (two) times daily.     simvastatin 40 MG tablet  Commonly known as:  ZOCOR  Take 40 mg by mouth at bedtime.     sodium bicarbonate 650 MG tablet  Take 650 mg by mouth every morning.     sodium polystyrene 15 GM/60ML suspension  Commonly known as:  KAYEXALATE  Take 30 g by mouth 3 (three) times a  week. Tues, thur, and saturdays     testosterone 50 MG/5GM Gel  Commonly known as:  ANDROGEL  Place 5 g onto the skin daily.     valACYclovir 500 MG tablet  Commonly known as:  VALTREX  Take 500 mg by mouth 2 (two) times daily.       Follow-up Information   Follow up with Redge Gainer, MD. Schedule an appointment as soon as possible for a visit in 1 week.   Contact information:   [address] [place] [address] [phone]       The results of significant diagnostics from this hospitalization (including imaging, microbiology, ancillary and laboratory) are listed below for reference.    Significant Diagnostic Studies: Dg Chest 2 View  04/18/2012  *RADIOLOGY REPORT*  Clinical Data: Fever.  Ayriel Texidor of breath.  CHEST - 2 VIEW  Comparison: None.  Findings: Hazy bilateral central airspace opacities have developed. Normal heart size.  No pneumothorax.  No pleural effusion. Stable upper thoracic wedge compression deformities.  IMPRESSION: Bilateral perihilar hazy airspace disease worrisome for bronchopneumonia.   Original Report Authenticated By: Marybelle Killings, M.D.    Dg Chest 2 View  04/14/2012  *RADIOLOGY REPORT*  Clinical Data: Shortness of breath  CHEST - 2 VIEW  Comparison:  April 13, 2012  Findings: The mild left base atelectasis has cleared.  Currently, lungs are clear.  Heart size and pulmonary vascularity are normal. No adenopathy.  No bone lesions.  IMPRESSION: Lungs now clear.   Original Report Authenticated By: Lowella Grip, M.D.    Mr Brain Wo Contrast  04/17/2012  *RADIOLOGY REPORT*  Clinical Data: Near syncope, weakness.  Chronic kidney disease.  MRI HEAD WITHOUT CONTRAST  Technique:  Multiplanar, multiecho pulse sequences of the brain and surrounding structures were obtained according to standard protocol without intravenous contrast.  Comparison: MRI 03/12/2004  Findings:   Ventricle size is normal.  Negative for acute infarct. No significant chronic ischemia.   Cerebral white matter and brainstem have normal signal.  Negative for intracranial hemorrhage or fluid collection.  No mass or edema.  No midline shift.  IMPRESSION: No acute intracranial abnormality.   Original Report Authenticated By: Carl Best, M.D.    Nm Gastric Emptying  04/17/2012  *RADIOLOGY REPORT*  Clinical Data:  Projectile vomiting.  Diabetes.  NUCLEAR MEDICINE GASTRIC EMPTYING SCAN  Technique:  After oral ingestion of radiolabeled meal, sequential abdominal images were obtained for 120 minutes.  Residual percentage of activity remaining within the stomach was calculated at 60 and 120 minutes.  Radiopharmaceutical:  2.2 mCi Tc-45m sulfur colloid.  Comparison:  None.  Findings: Gastric retention at 60 minutes is 67%.  Retention at 120 minutes is 66%.  IMPRESSION: Abnormal gastric emptying study with 66% retention at 2 hours.   Original Report Authenticated By: Lorriane Shire, M.D.    Nm Pulmonary Perf And Vent  04/14/2012  *RADIOLOGY REPORT*  Clinical Data: Shortness of breath  NM PULMONARY VENTILATION AND PERFUSION SCAN  Views:  Anterior, posterior, left lateral, right lateral, RPO, LPO, RAO, LAO -  ventilation and perfusion  Radiopharmaceutical: Technetium 79m DTPA - ventilation; technetium 83m macroaggregated albumin - perfusion  Dose:  35.0 mCi - ventilation; 6.0 mCi - perfusion  Route of administration:  Inhalation - ventilation; intravenous - perfusion  Comparison: Chest radiograph April 13, 2012  Findings:  The ventilation study shows homogeneous and symmetric uptake of radiotracer bilaterally.  The perfusion study shows homogeneous and symmetric uptake of radiotracer bilaterally.  There is no appreciable ventilation / perfusion mismatch.  IMPRESSION: Normal ventilation and perfusion lung scans.  Very low probability of pulmonary embolus.   Original Report Authenticated By: Lowella Grip, M.D.    Dg Chest Port 1 View  04/13/2012  *RADIOLOGY REPORT*  Clinical Data: Shortness of breath   PORTABLE CHEST - 1 VIEW  Comparison: 06/26/2011  Findings: Hypoaeration.  Mild left lung base opacity.  No pleural effusion or pneumothorax.  Cardiomediastinal contours within normal range.  No acute osseous finding.  IMPRESSION: Mild left lung base opacity; atelectasis versus infiltrate.   Original Report Authenticated By: Carlos Levering, M.D.    Dg Abd 2 Views  04/17/2012  *RADIOLOGY REPORT*  Clinical Data: Bilious emesis.  ABDOMEN - 2 VIEW  Comparison: None.  Findings: No free air or free fluid in the abdomen.  There is only a minimal amount of air scattered throughout the nondistended bowel.  No worrisome abdominal calcifications.  No osseous abnormality.  IMPRESSION: Benign-appearing abdomen.   Original Report Authenticated By: Lorriane Shire, M.D.     Microbiology: No results found for this or any previous visit (from the past 240 hour(s)).   Labs: Basic Metabolic Panel:  Recent Labs Lab 04/16/12 1510 04/16/12 2220 04/17/12 0315 04/18/12 0509 04/19/12 0511  NA 131* 136 135 132* 132*  K 4.8 4.2 4.6 4.3 4.0  CL 96 102 106 100 99  CO2 22 21 21 22 21   GLUCOSE 345* 117* 113* 158* 167*  BUN 46* 40* 39* 34* 32*  CREATININE 2.08* 1.70* 1.81* 1.59* 1.64*  CALCIUM 9.6 9.0 8.9 9.2 9.0  MG  --  1.4*  --   --   --   PHOS  --  2.5  --   --   --    Liver Function Tests:  Recent Labs Lab 04/16/12 2220 04/17/12 0315 04/18/12 0509  AST 30 29 37  ALT 19 18 19   ALKPHOS 87 81 80  BILITOT 0.2* 0.2* 0.2*  PROT 6.8 6.2 6.5  ALBUMIN 2.8* 2.7* 3.0*    Recent Labs Lab 04/18/12 0509  LIPASE 15   No results found for this basename: AMMONIA,  in the last 168 hours CBC:  Recent Labs Lab 04/16/12 1510 04/16/12 2220 04/17/12 0315 04/18/12 0509 04/19/12 0511  WBC 5.7 5.4 6.2 6.2 5.8  NEUTROABS 3.5 3.4  --   --   --   HGB 12.8* 11.8* 10.8* 11.3* 10.7*  HCT 37.2* 34.0* 32.0* 33.1* 31.5*  MCV 85.9 85.4 86.3 84.9 85.6  PLT 142* 146* 139* 154 154   Cardiac Enzymes:  Recent  Labs Lab 04/16/12 1510 04/16/12 2220 04/17/12 0315  TROPONINI <0.30 <0.30 <0.30   BNP: BNP (last 3 results)  Recent Labs  04/16/12 2020 04/16/12 2220  PROBNP 66.5 94.3   CBG:  Recent Labs Lab 04/18/12 1200 04/18/12 1706 04/18/12 2103 04/18/12 2245 04/19/12 0747  GLUCAP 336* 139* 89 108* 154*    Time coordinating discharge: 45 minutes  Signed:  Devony Mcgrady  [facility] 04/19/2012, 8:55 AM

## 2012-04-18 NOTE — Progress Notes (Addendum)
[facility] PROGRESS NOTE  Patrick Brown [id number] DOB: 06-Dec-1970 DOA: 04/16/2012 PCP: Patrick Gainer, MD  Assessment/Plan  Near syncope, weakness, may be related to CAP or viral illness.   Orthostatics negative Trop neg so far 2 D ECHO with normal EF and no wall motion or valvular abnl. TSH 1.7   Cortisol level 27 Appreciate cardiology consultation and their recommendations.  UDS neg and ethanol level neg Follow up PT evaluation  Fevers and new infiltrate on CXR, likely CAP versus aspiration pneumonia after vomiting yesterday.  Complicated by fact that patient has been on longstanding ciprofloxacin.  + SIRS criteria with fever, tachycardia -  Blood cultures x 2 -  Flu PCR -  Droplet precautions -  Start levofloxacin for now - if flu test positive, D/C and start tamiflu -  If flu test negative, will change to ceftriaxone and azithro -  Urine legionella and S. pneumo ag -  Tylenol prn fever -  UA pending  Mild abdominal tendernerss -  Consider RUQ [place] if pain increases -  Add LFTs and lipase   Projectile bilious vomiting:  DDx included mass effect, stroke, gastroparesis, partial bowel obstruction.  Intermittent nausea this week prior to today, but after starting reglan was able to eat dinner -  Continue reglan -  Abd XR wnl -  Gastric emptying study with delayed emptying -  MRI brain (wo contrast due to decreased GFR) unremarkable  Chronic kidney disease, stage 3, mod decreased GFR  Creatinine appears to be at baseline  Continue sodium bicarbonate  Hypertension  BP at goal, continue losartan  HLD:  -  D/c niacin as no mortality benefit in setting of statin use.    DM (diabetes mellitus), type 1, A1c near goal previously in EPIC On insulin pump A1c 7.9  Hypothyroid  Continue levothyroxine TSH at goal Anemia, iron deficiency  Hemoglobin stable at 12.8 on admission. No signs of acute bleed. Continue ferrous sulfate.   Diet:  Diabetic Access:  PIV IVF:   NS at 72ml/h Proph:  SCDs  Code Status: full code Family Communication: spoke with patient and alone today Disposition Plan: d/c telemetry.     Consultants:  Cardiology  Procedures:  MRI brain pending  KUB pending  Gastric emptying study pending  Antibiotics:  Levofloxacin    HPI/Subjective:  + fevers, chills.  Denies chest pain.  + shortness of breath wo wheezing that is stable from prior.  Nausea improved after starting reglan.  Denies diarrhea and had soft BM yesterday.  Mild RUQ tenderness.    Objective: Filed Vitals:   04/17/12 2230 04/18/12 0206 04/18/12 0649 04/18/12 0652  BP: 136/78  137/74   Pulse: 112  108   Temp: 101.7 F (38.7 C) 98.9 F (37.2 C) 100.6 F (38.1 C)   TempSrc: Oral Oral Oral   Resp: 20  20   Height:      Weight:    83.6 kg (184 lb 4.9 oz)  SpO2: 97%  96%     Intake/Output Summary (Last 24 hours) at 04/18/12 0925 Last data filed at 04/18/12 [id number]  Gross per 24 hour  Intake 2844.17 ml  Output      0 ml  Net 2844.17 ml   Filed Weights   04/16/12 2142 04/17/12 0542 04/18/12 0652  Weight: 84.8 kg (186 lb 15.2 oz) 84.8 kg (186 lb 15.2 oz) 83.6 kg (184 lb 4.9 oz)    Exam:   General:  CM, No acute distress  HEENT:  NCAT,  MMM  Cardiovascular:  RRR, nl S1, S2 no 2/6 systolic murmur at apex, 2+ pulses, warm extremities  Respiratory:   CTAB, no increased WOB  Abdomen:   NABS, soft, mild TTP RUQ, ND  MSK:   Normal tone and bulk, no LEE  Neuro:  Grossly intact  Data Reviewed: Basic Metabolic Panel:  Recent Labs Lab 04/13/12 0125 04/16/12 1510 04/16/12 2220 04/17/12 0315 04/18/12 0509  NA 134* 131* 136 135 132*  K 4.6 4.8 4.2 4.6 4.3  CL 97 96 102 106 100  CO2 25 22 21 21 22   GLUCOSE 342* 345* 117* 113* 158*  BUN 46* 46* 40* 39* 34*  CREATININE 1.87* 2.08* 1.70* 1.81* 1.59*  CALCIUM 9.1 9.6 9.0 8.9 9.2  MG  --   --  1.4*  --   --   PHOS  --   --  2.5  --   --    Liver Function Tests:  Recent Labs Lab  04/16/12 2220 04/17/12 0315  AST 30 29  ALT 19 18  ALKPHOS 87 81  BILITOT 0.2* 0.2*  PROT 6.8 6.2  ALBUMIN 2.8* 2.7*   No results found for this basename: LIPASE, AMYLASE,  in the last 168 hours No results found for this basename: AMMONIA,  in the last 168 hours CBC:  Recent Labs Lab 04/16/12 1510 04/16/12 2220 04/17/12 0315 04/18/12 0509  WBC 5.7 5.4 6.2 6.2  NEUTROABS 3.5 3.4  --   --   HGB 12.8* 11.8* 10.8* 11.3*  HCT 37.2* 34.0* 32.0* 33.1*  MCV 85.9 85.4 86.3 84.9  PLT 142* 146* 139* 154   Cardiac Enzymes:  Recent Labs Lab 04/16/12 1510 04/16/12 2220 04/17/12 0315  TROPONINI <0.30 <0.30 <0.30   BNP (last 3 results)  Recent Labs  04/16/12 2020 04/16/12 2220  PROBNP 66.5 94.3   CBG:  Recent Labs Lab 04/17/12 1357 04/17/12 1717 04/17/12 2213 04/18/12 0202 04/18/12 0738  GLUCAP 261* 238* 273* 210* 151*    No results found for this or any previous visit (from the past 240 hour(s)).   Studies: Dg Chest 2 View  04/18/2012  *RADIOLOGY REPORT*  Clinical Data: Fever.  Patrick Brown of breath.  CHEST - 2 VIEW  Comparison: None.  Findings: Hazy bilateral central airspace opacities have developed. Normal heart size.  No pneumothorax.  No pleural effusion. Stable upper thoracic wedge compression deformities.  IMPRESSION: Bilateral perihilar hazy airspace disease worrisome for bronchopneumonia.   Original Report Authenticated By: Patrick Brown, M.D.    Mr Brain Wo Contrast  04/17/2012  *RADIOLOGY REPORT*  Clinical Data: Near syncope, weakness.  Chronic kidney disease.  MRI HEAD WITHOUT CONTRAST  Technique:  Multiplanar, multiecho pulse sequences of the brain and surrounding structures were obtained according to standard protocol without intravenous contrast.  Comparison: MRI 03/12/2004  Findings:   Ventricle size is normal.  Negative for acute infarct. No significant chronic ischemia.  Cerebral white matter and brainstem have normal signal.  Negative for intracranial  hemorrhage or fluid collection.  No mass or edema.  No midline shift.  IMPRESSION: No acute intracranial abnormality.   Original Report Authenticated By: Patrick Brown, M.D.    Nm Gastric Emptying  04/17/2012  *RADIOLOGY REPORT*  Clinical Data:  Projectile vomiting.  Diabetes.  NUCLEAR MEDICINE GASTRIC EMPTYING SCAN  Technique:  After oral ingestion of radiolabeled meal, sequential abdominal images were obtained for 120 minutes.  Residual percentage of activity remaining within the stomach was calculated at 60 and 120 minutes.  Radiopharmaceutical:  2.2 mCi Tc-12m sulfur colloid.  Comparison:  None.  Findings: Gastric retention at 60 minutes is 67%.  Retention at 120 minutes is 66%.  IMPRESSION: Abnormal gastric emptying study with 66% retention at 2 hours.   Original Report Authenticated By: Lorriane Shire, M.D.    Dg Abd 2 Views  04/17/2012  *RADIOLOGY REPORT*  Clinical Data: Bilious emesis.  ABDOMEN - 2 VIEW  Comparison: None.  Findings: No free air or free fluid in the abdomen.  There is only a minimal amount of air scattered throughout the nondistended bowel.  No worrisome abdominal calcifications.  No osseous abnormality.  IMPRESSION: Benign-appearing abdomen.   Original Report Authenticated By: Lorriane Shire, M.D.     Scheduled Meds: . aspirin  81 mg Oral q morning - 10a  . Difluprednate  1 drop Right Eye TID  . ferrous sulfate  325 mg Oral BID  . furosemide  20 mg Oral BID  . Ganciclovir  1 application Right Eye TID  . hydrOXYzine  10 mg Oral BID  . insulin pump   Subcutaneous TID AC, HS, 0200  . levothyroxine  175 mcg Oral QAC breakfast  . losartan  25 mg Oral q morning - 10a  . metoCLOPramide  5 mg Oral TID AC  . omega-3 acid ethyl esters  2 g Oral BID  . pantoprazole  40 mg Oral Daily  . simvastatin  40 mg Oral QHS  . sodium bicarbonate  650 mg Oral q morning - 10a  . sodium chloride  3 mL Intravenous Q12H  . testosterone  5 g Transdermal Daily  . valACYclovir  500 mg Oral BID  .  venlafaxine  50 mg Oral BID   Continuous Infusions: . sodium chloride 1,000 mL (04/16/12 1844)    Principal Problem:   Near syncope Active Problems:   Chronic kidney disease, stage 3, mod decreased GFR   DM (diabetes mellitus), type 1, uncontrolled   Hypothyroid   Anemia, iron deficiency   Projectile vomiting    Time spent: 30 min    Patrick Brown, [facility] Pager [phone]. If 7PM-7AM, please contact night-coverage at [email or web address], password [facility] 04/18/2012, 9:25 AM  LOS: 2 days

## 2012-04-19 ENCOUNTER — Other Ambulatory Visit: Payer: Self-pay

## 2012-04-19 LAB — CBC
HCT: 31.5 % — ABNORMAL LOW (ref 39.0–52.0)
Platelets: 154 10*3/uL (ref 150–400)
RDW: 13.8 % (ref 11.5–15.5)
WBC: 5.8 10*3/uL (ref 4.0–10.5)

## 2012-04-19 LAB — BASIC METABOLIC PANEL
Calcium: 9 mg/dL (ref 8.4–10.5)
Chloride: 99 mEq/L (ref 96–112)
Creatinine, Ser: 1.64 mg/dL — ABNORMAL HIGH (ref 0.50–1.35)
GFR calc Af Amer: 58 mL/min — ABNORMAL LOW (ref >90)
GFR calc non Af Amer: 50 mL/min — ABNORMAL LOW (ref >90)

## 2012-04-19 LAB — LEGIONELLA ANTIGEN, URINE

## 2012-04-19 MED ORDER — AZITHROMYCIN 250 MG PO TABS: 250.0000 mg | ORAL_TABLET | Freq: Every day | ORAL | Status: DC

## 2012-04-19 MED ORDER — AMOXICILLIN-POT CLAVULANATE 875-125 MG PO TABS: 1.0000 | ORAL_TABLET | Freq: Two times a day (BID) | ORAL | Status: DC

## 2012-04-19 MED ORDER — METOCLOPRAMIDE HCL 5 MG PO TABS: 5.0000 mg | ORAL_TABLET | Freq: Three times a day (TID) | ORAL | Status: DC

## 2012-04-19 NOTE — Progress Notes (Signed)
Patient discharged home with mother. Discharge instructions given and explained to patient and he verbalized understanding. Denies any pain/distress. Skin intact, no wound. Accompanied home by mother transported to the car by staff.

## 2012-04-21 NOTE — ED Provider Notes (Signed)
42 yo male with iddm with dyspnea and general weakness   wdwn male nad Lungs cta cv tachycardia   I performed a history and physical examination of Patrick Brown and discussed his management with [name].  I agree with the history, physical, assessment, and plan of care, with the following exceptions: None  I was present for the following procedures: None Time Spent in Critical Care of the patient: None Time spent in discussions with the patient and family: 10 minutes  Klara Stjames Shelda Jakes, MD 04/21/12 1043

## 2012-04-23 ENCOUNTER — Telehealth: Payer: Self-pay | Admitting: Family Medicine

## 2012-04-23 NOTE — Telephone Encounter (Signed)
Needs a hospital F/U appointment

## 2012-04-23 NOTE — Telephone Encounter (Signed)
appt made

## 2012-04-24 LAB — CULTURE, BLOOD (ROUTINE X 2): Culture: NO GROWTH

## 2012-04-25 ENCOUNTER — Telehealth: Payer: Self-pay | Admitting: Family Medicine

## 2012-04-25 ENCOUNTER — Telehealth: Payer: Self-pay | Admitting: *Deleted

## 2012-04-25 MED ORDER — AMOXICILLIN-POT CLAVULANATE 875-125 MG PO TABS: 1.0000 | ORAL_TABLET | Freq: Two times a day (BID) | ORAL | Status: DC

## 2012-04-25 NOTE — Telephone Encounter (Signed)
Pt's mom called to inform pt was recently hospitalized with pneumonia. He finished antibiotic but still has low grade fever with dry cough, denies SOB. Would like refill of Augmentin. Please call to advise

## 2012-04-27 NOTE — Telephone Encounter (Signed)
rx called in

## 2012-05-01 ENCOUNTER — Telehealth: Payer: Self-pay | Admitting: *Deleted

## 2012-05-01 ENCOUNTER — Ambulatory Visit (INDEPENDENT_AMBULATORY_CARE_PROVIDER_SITE_OTHER): Payer: BC Managed Care – PPO | Admitting: Family Medicine

## 2012-05-01 ENCOUNTER — Other Ambulatory Visit: Payer: Self-pay | Admitting: *Deleted

## 2012-05-01 ENCOUNTER — Encounter: Payer: Self-pay | Admitting: Family Medicine

## 2012-05-01 ENCOUNTER — Ambulatory Visit (INDEPENDENT_AMBULATORY_CARE_PROVIDER_SITE_OTHER): Payer: BC Managed Care – PPO

## 2012-05-01 VITALS — BP 106/72 | HR 85 | Temp 97.3°F | Ht 70.0 in | Wt 185.0 lb

## 2012-05-01 DIAGNOSIS — IMO0002 Reserved for concepts with insufficient information to code with codable children: Secondary | ICD-10-CM

## 2012-05-01 DIAGNOSIS — J189 Pneumonia, unspecified organism: Secondary | ICD-10-CM

## 2012-05-01 DIAGNOSIS — D509 Iron deficiency anemia, unspecified: Secondary | ICD-10-CM

## 2012-05-01 DIAGNOSIS — N183 Chronic kidney disease, stage 3 unspecified: Secondary | ICD-10-CM

## 2012-05-01 DIAGNOSIS — E1065 Type 1 diabetes mellitus with hyperglycemia: Secondary | ICD-10-CM

## 2012-05-01 LAB — BASIC METABOLIC PANEL WITH GFR
BUN: 36 mg/dL — ABNORMAL HIGH (ref 6–23)
CO2: 20 mEq/L (ref 19–32)
Chloride: 103 mEq/L (ref 96–112)
Creat: 1.62 mg/dL — ABNORMAL HIGH (ref 0.50–1.35)
GFR, Est Non African American: 51 mL/min — ABNORMAL LOW
Potassium: 5.2 mEq/L (ref 3.5–5.3)

## 2012-05-01 LAB — POCT CBC
Granulocyte percent: 61.4 %G (ref 37–80)
Hemoglobin: 12 g/dL — AB (ref 14.1–18.1)
MCH, POC: 29.7 pg (ref 27–31.2)
MCV: 87.2 fL (ref 80–97)
MPV: 6.6 fL (ref 0–99.8)
Platelet Count, POC: 286 10*3/uL (ref 142–424)
RBC: 4.1 M/uL — AB (ref 4.69–6.13)
WBC: 5 10*3/uL (ref 4.6–10.2)

## 2012-05-01 MED ORDER — METOCLOPRAMIDE HCL 5 MG PO TABS: 5.0000 mg | ORAL_TABLET | Freq: Three times a day (TID) | ORAL | Status: DC

## 2012-05-01 NOTE — Patient Instructions (Addendum)
Rest fluids and rehydration Continue to increase exercise and follow diet Hopefully return to work after recheck visit in 3 weeks

## 2012-05-01 NOTE — Telephone Encounter (Signed)
Pt called to inform his new med is Proscar, it was added to his med list

## 2012-05-01 NOTE — Progress Notes (Signed)
Subjective:    Patient ID: Patrick Brown, male    DOB: 03/19/1970, 42 y.o.   MRN: [id number]  HPI This patient presents for recheck of multiple medical problems. His mom accompanies the patient today.  Patient Active Problem List  Diagnosis  . Chronic kidney disease, stage 3, mod decreased GFR  . DM (diabetes mellitus), type 1, uncontrolled  . Hypothyroid  . Anemia, iron deficiency  . Near syncope  . Fever, unspecified  . SIRS (systemic inflammatory response syndrome)  . CAP (community acquired pneumonia)  . Gastroparesis  . Sinus tachycardia    In addition, patient will finish antibiotics after in the morning. He says he is getting his energy back slowly. The hospital course was reviewed with the patient. He was sent home on Augmentin and Zithromax.  The allergies, current medications, past medical history, surgical history, family and social history are reviewed.  Immunizations reviewed.  Health maintenance reviewed.        Review of Systems  Constitutional: Positive for fatigue (improving). Negative for fever (x 3-5 days).  HENT: Positive for congestion (slight).   Respiratory: Positive for cough (slight). Negative for shortness of breath and wheezing.   Neurological: Negative for dizziness, light-headedness and headaches.       Objective:   Physical Exam BP 106/72  Pulse 85  Temp(Src) 97.3 F (36.3 C) (Oral)  Ht 5\' 10"  (1.778 m)  Wt 185 lb (83.915 kg)  BMI 26.54 kg/m2  The patient appeared well nourished and normally developed, alert and oriented to time and place. Speech, behavior and judgement appear normal. Vital signs as documented.  Head exam is unremarkable. No scleral icterus or pallor noted. Throat and nose normal Neck is without jugular venous distension, thyromegally, or carotid bruits. Carotid upstrokes are brisk bilaterally. No cervical adenopathy. Lungs are clear anteriorly and posteriorly to auscultation. Normal respiratory effort. Cardiac  exam reveals regular rate and rhythm @ 84/min. First and second heart sounds normal. No murmurs, rubs or gallops.  Abdominal exam reveals normal bowl sounds, no masses, no organomegaly and no aortic enlargement. No inguinal adenopathy. Extremities are nonedematous and both femoral  pulses are normal. Skin without pallor or jaundice.  Warm and dry, without rash. Neurologic exam reveals normal deep tendon reflexes and normal sensation.  [facility] reading (PRIMARY) by  Dr.Javien Tesch Toney Reil appears normal with no sign of any pneumonia                                Results for orders placed in visit on 05/01/12  POCT CBC      Result Value Range   WBC 5.0  4.6 - 10.2 K/uL   Lymph, poc 1.3  0.6 - 3.4   POC LYMPH PERCENT 25.7  10 - 50 %L   MID (cbc)    0 - 0.9   POC MID %    0 - 12 %M   POC Granulocyte 3.1  2 - 6.9   Granulocyte percent 61.4  37 - 80 %G   RBC 4.1 (*) 4.69 - 6.13 M/uL   Hemoglobin 12.0 (*) 14.1 - 18.1 g/dL   HCT, POC 35.4 (*) 43.5 - 53.7 %   MCV 87.2  80 - 97 fL   MCH, POC 29.7  27 - 31.2 pg   MCHC 34.1  31.8 - 35.4 g/dL   RDW, POC 14.3     Platelet Count, POC 286.0  142 - 424  K/uL   MPV 6.6  0 - 99.8 fL           Assessment & Plan:  1. CAP (community acquired pneumonia) - POCT CBC - DG Chest 2 View; Future  2. DM (diabetes mellitus), type 1, uncontrolled   3. Chronic kidney disease, stage 3, mod decreased GFR - BASIC METABOLIC PANEL WITH GFR  4. Anemia, iron deficiency - POCT CBC  5. Will give note for FMLA leave for 3-4 weeks     He needs to get his strength back before he returns to work  6. patient is aware of his CBC result

## 2012-05-15 ENCOUNTER — Telehealth: Payer: Self-pay | Admitting: Internal Medicine

## 2012-05-15 NOTE — Telephone Encounter (Signed)
Transfer from [facility] in [facility] per Pt and family request, called by EDP  41/M from [place] with DM 1 on insulin pump, ?h/o Hypopituitarism presented there with seizures x1 and  hypoglycemia-persistent requiring D5gtt, was combative when EMS picked him up after seizure and required versed x2. A little drowsy but easily aroused and oriented x3 now, vitals stable, Labs normal except glucose and CT head unremarkable. Initial CBG 60-->60 again after D50 and then 120 on D5gtt. Accepted to Med-Surg at [facility]

## 2012-05-16 ENCOUNTER — Observation Stay (HOSPITAL_COMMUNITY)
Admission: AD | Admit: 2012-05-16 | Discharge: 2012-05-16 | Disposition: A | Discharge status: Home / Self Care | Payer: BC Managed Care – PPO | Source: Other Acute Inpatient Hospital | Attending: Internal Medicine | Admitting: Internal Medicine

## 2012-05-16 ENCOUNTER — Encounter (HOSPITAL_COMMUNITY): Payer: Self-pay | Admitting: Unknown Physician Specialty

## 2012-05-16 DIAGNOSIS — E1039 Type 1 diabetes mellitus with other diabetic ophthalmic complication: Secondary | ICD-10-CM | POA: Insufficient documentation

## 2012-05-16 DIAGNOSIS — IMO0002 Reserved for concepts with insufficient information to code with codable children: Secondary | ICD-10-CM

## 2012-05-16 DIAGNOSIS — E039 Hypothyroidism, unspecified: Secondary | ICD-10-CM | POA: Diagnosis present

## 2012-05-16 DIAGNOSIS — D509 Iron deficiency anemia, unspecified: Secondary | ICD-10-CM | POA: Diagnosis present

## 2012-05-16 DIAGNOSIS — E1069 Type 1 diabetes mellitus with other specified complication: Secondary | ICD-10-CM | POA: Insufficient documentation

## 2012-05-16 DIAGNOSIS — K3189 Other diseases of stomach and duodenum: Secondary | ICD-10-CM | POA: Insufficient documentation

## 2012-05-16 DIAGNOSIS — E11319 Type 2 diabetes mellitus with unspecified diabetic retinopathy without macular edema: Secondary | ICD-10-CM | POA: Insufficient documentation

## 2012-05-16 DIAGNOSIS — N183 Chronic kidney disease, stage 3 unspecified: Secondary | ICD-10-CM | POA: Diagnosis present

## 2012-05-16 DIAGNOSIS — K3184 Gastroparesis: Secondary | ICD-10-CM | POA: Diagnosis present

## 2012-05-16 DIAGNOSIS — E109 Type 1 diabetes mellitus without complications: Secondary | ICD-10-CM | POA: Diagnosis present

## 2012-05-16 DIAGNOSIS — E785 Hyperlipidemia, unspecified: Secondary | ICD-10-CM | POA: Insufficient documentation

## 2012-05-16 DIAGNOSIS — E1065 Type 1 diabetes mellitus with hyperglycemia: Secondary | ICD-10-CM

## 2012-05-16 DIAGNOSIS — Z79899 Other long term (current) drug therapy: Secondary | ICD-10-CM | POA: Insufficient documentation

## 2012-05-16 DIAGNOSIS — R569 Unspecified convulsions: Principal | ICD-10-CM

## 2012-05-16 LAB — CBC WITH DIFFERENTIAL/PLATELET
Basophils Absolute: 0 10*3/uL (ref 0.0–0.1)
Basophils Relative: 1 % (ref 0–1)
Eosinophils Relative: 5 % (ref 0–5)
HCT: 32 % — ABNORMAL LOW (ref 39.0–52.0)
Hemoglobin: 10.6 g/dL — ABNORMAL LOW (ref 13.0–17.0)
Lymphocytes Relative: 30 % (ref 12–46)
MCHC: 33.1 g/dL (ref 30.0–36.0)
MCV: 87.2 fL (ref 78.0–100.0)
Monocytes Absolute: 0.9 10*3/uL (ref 0.1–1.0)
Monocytes Relative: 19 % — ABNORMAL HIGH (ref 3–12)
Neutro Abs: 2.2 10*3/uL (ref 1.7–7.7)
RDW: 13.7 % (ref 11.5–15.5)

## 2012-05-16 LAB — URINALYSIS, MICROSCOPIC ONLY
Bilirubin Urine: NEGATIVE
Leukocytes, UA: NEGATIVE
Nitrite: NEGATIVE
Specific Gravity, Urine: 1.015 (ref 1.005–1.030)
Urobilinogen, UA: 0.2 mg/dL (ref 0.0–1.0)
pH: 6 (ref 5.0–8.0)

## 2012-05-16 LAB — COMPREHENSIVE METABOLIC PANEL
ALT: 38 U/L (ref 0–53)
AST: 49 U/L — ABNORMAL HIGH (ref 0–37)
Albumin: 3.1 g/dL — ABNORMAL LOW (ref 3.5–5.2)
Alkaline Phosphatase: 96 U/L (ref 39–117)
Chloride: 104 mEq/L (ref 96–112)
Potassium: 3.5 mEq/L (ref 3.5–5.1)
Sodium: 142 mEq/L (ref 135–145)
Total Bilirubin: 0.3 mg/dL (ref 0.3–1.2)
Total Protein: 6.6 g/dL (ref 6.0–8.3)

## 2012-05-16 LAB — RAPID URINE DRUG SCREEN, HOSP PERFORMED
Barbiturates: NOT DETECTED
Cocaine: NOT DETECTED

## 2012-05-16 LAB — CK: Total CK: 3190 U/L — ABNORMAL HIGH (ref 7–232)

## 2012-05-16 MED ORDER — SODIUM BICARBONATE 650 MG PO TABS
650.0000 mg | ORAL_TABLET | Freq: Every morning | ORAL | Status: DC
  Administered 2012-05-16: 650 mg via ORAL
  Filled 2012-05-16: qty 1

## 2012-05-16 MED ORDER — FERROUS SULFATE 325 (65 FE) MG PO TABS
325.0000 mg | ORAL_TABLET | Freq: Two times a day (BID) | ORAL | Status: DC
  Administered 2012-05-16: 325 mg via ORAL
  Filled 2012-05-16 (×3): qty 1

## 2012-05-16 MED ORDER — TESTOSTERONE 50 MG/5GM (1%) TD GEL
5.0000 g | Freq: Every day | TRANSDERMAL | Status: DC
  Administered 2012-05-16: 5 g via TRANSDERMAL
  Filled 2012-05-16: qty 5

## 2012-05-16 MED ORDER — ASPIRIN 81 MG PO CHEW
81.0000 mg | CHEWABLE_TABLET | Freq: Every morning | ORAL | Status: DC
  Administered 2012-05-16: 81 mg via ORAL
  Filled 2012-05-16: qty 1

## 2012-05-16 MED ORDER — HYDROXYZINE HCL 10 MG PO TABS
10.0000 mg | ORAL_TABLET | Freq: Two times a day (BID) | ORAL | Status: DC
  Administered 2012-05-16: 10 mg via ORAL
  Filled 2012-05-16 (×2): qty 1

## 2012-05-16 MED ORDER — DIFLUPREDNATE 0.05 % OP EMUL: 1.0000 [drp] | Freq: Three times a day (TID) | OPHTHALMIC | Status: DC

## 2012-05-16 MED ORDER — FUROSEMIDE 20 MG PO TABS
20.0000 mg | ORAL_TABLET | Freq: Two times a day (BID) | ORAL | Status: DC
  Administered 2012-05-16: 20 mg via ORAL
  Filled 2012-05-16 (×3): qty 1

## 2012-05-16 MED ORDER — ONDANSETRON HCL 4 MG/2ML IJ SOLN: 4.0000 mg | Freq: Four times a day (QID) | INTRAMUSCULAR | Status: DC | PRN

## 2012-05-16 MED ORDER — ACETAMINOPHEN 325 MG PO TABS: 650.0000 mg | ORAL_TABLET | Freq: Four times a day (QID) | ORAL | Status: DC | PRN

## 2012-05-16 MED ORDER — LOSARTAN POTASSIUM 25 MG PO TABS
25.0000 mg | ORAL_TABLET | Freq: Every morning | ORAL | Status: DC
  Administered 2012-05-16: 25 mg via ORAL
  Filled 2012-05-16: qty 1

## 2012-05-16 MED ORDER — OMEGA-3-ACID ETHYL ESTERS 1 G PO CAPS
2.0000 g | ORAL_CAPSULE | Freq: Two times a day (BID) | ORAL | Status: DC
  Administered 2012-05-16: 2 g via ORAL
  Filled 2012-05-16 (×2): qty 2

## 2012-05-16 MED ORDER — EPOETIN ALFA 20000 UNIT/ML IJ SOLN
20000.0000 [IU] | Freq: Once | INTRAMUSCULAR | Status: DC
  Filled 2012-05-16: qty 1

## 2012-05-16 MED ORDER — LEVOTHYROXINE SODIUM 175 MCG PO TABS
175.0000 ug | ORAL_TABLET | Freq: Every day | ORAL | Status: DC
  Administered 2012-05-16: 175 ug via ORAL
  Filled 2012-05-16 (×2): qty 1

## 2012-05-16 MED ORDER — ENOXAPARIN SODIUM 40 MG/0.4ML ~~LOC~~ SOLN
40.0000 mg | SUBCUTANEOUS | Status: DC
  Filled 2012-05-16: qty 0.4

## 2012-05-16 MED ORDER — EPOETIN ALFA 20000 UNIT/ML IJ SOLN
20000.0000 [IU] | Freq: Once | INTRAMUSCULAR | Status: AC
  Administered 2012-05-16: 20000 [IU] via SUBCUTANEOUS
  Filled 2012-05-16: qty 1

## 2012-05-16 MED ORDER — VALACYCLOVIR HCL 500 MG PO TABS
500.0000 mg | ORAL_TABLET | Freq: Two times a day (BID) | ORAL | Status: DC
  Administered 2012-05-16: 500 mg via ORAL
  Filled 2012-05-16 (×2): qty 1

## 2012-05-16 MED ORDER — SODIUM POLYSTYRENE SULFONATE 15 GM/60ML PO SUSP
30.0000 g | ORAL | Status: DC
  Filled 2012-05-16: qty 120

## 2012-05-16 MED ORDER — ONDANSETRON HCL 4 MG PO TABS: 4.0000 mg | ORAL_TABLET | Freq: Four times a day (QID) | ORAL | Status: DC | PRN

## 2012-05-16 MED ORDER — SIMVASTATIN 40 MG PO TABS
40.0000 mg | ORAL_TABLET | Freq: Every day | ORAL | Status: DC
  Filled 2012-05-16: qty 1

## 2012-05-16 MED ORDER — METOCLOPRAMIDE HCL 5 MG PO TABS
5.0000 mg | ORAL_TABLET | Freq: Three times a day (TID) | ORAL | Status: DC
  Administered 2012-05-16: 5 mg via ORAL
  Filled 2012-05-16 (×4): qty 1

## 2012-05-16 MED ORDER — VENLAFAXINE HCL 50 MG PO TABS
50.0000 mg | ORAL_TABLET | Freq: Two times a day (BID) | ORAL | Status: DC
  Administered 2012-05-16: 50 mg via ORAL
  Filled 2012-05-16 (×2): qty 1

## 2012-05-16 MED ORDER — PANTOPRAZOLE SODIUM 40 MG PO TBEC
80.0000 mg | DELAYED_RELEASE_TABLET | Freq: Every day | ORAL | Status: DC
  Administered 2012-05-16: 80 mg via ORAL
  Filled 2012-05-16: qty 2

## 2012-05-16 MED ORDER — FINASTERIDE 5 MG PO TABS
5.0000 mg | ORAL_TABLET | Freq: Every day | ORAL | Status: DC
  Administered 2012-05-16: 5 mg via ORAL
  Filled 2012-05-16: qty 1

## 2012-05-16 MED ORDER — ACETAMINOPHEN 650 MG RE SUPP: 650.0000 mg | Freq: Four times a day (QID) | RECTAL | Status: DC | PRN

## 2012-05-16 MED ORDER — GANCICLOVIR 0.15 % OP GEL: 1.0000 "application " | Freq: Every day | OPHTHALMIC | Status: DC

## 2012-05-16 NOTE — Progress Notes (Signed)
UR Completed.  Patrick Brown [id number] 05/16/2012

## 2012-05-16 NOTE — Progress Notes (Signed)
DC instructions gone over with patient and mother.  Patient aware of follow-up appointments and understands he is not to drive or operate machinery until he sees his neurologist.  Transported to front of hospital accompanied by mother for ride home.

## 2012-05-16 NOTE — Progress Notes (Signed)
[facility] History and Physical  Patrick Brown [id number] DOB: April 01, 1970 DOA: 05/16/2012   PCP: Redge Gainer, MD   Chief Complaint: seizure  HPI:  42 y/o male with hx of DM type I, CKD stage III, hyperlipidemia, deficiency anemia, and hyperkalemia presented to [facility] with seizure. The patient was found to be hypoglycemic with a blood sugar of 60. Patient was given an ampule of D50 W,, but his sugar remained 60. The patient was placed on a D5W drip and sent to the hospital. Apparently he was combative and required 2 doses of Versed, 25 mg each to stop the seizure. The patient did bite his tongue. There was no urine or bladder incontinence. The patient states that he has had a previous episode of hypoglycemic seizure approximately 10 years ago. At that time, his pressure was 9. 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  He usually gets this aura when he is hypoglycemic.  He drank some countrytime lemonade and he became more alert again.  Unfortunately, this aura occurred again and the next thing he knew, he was in the ED. blood work in the emergency department including BMP and CBC were essentially unremarkable except for a mildly elevated creatinine which is at his baseline. Serum creatinine was 1.63. His baseline is 1.6 at one point. CBC did reveal some thrombocytopenia at 105. Glucose was transported [place] here to [employer] on a D5W drip.  Blood sugar was 290 enroute.  The patient's most recent hemoglobin A1c on 04/17/2012 was 7.9. The patient is very knowledgeable regarding his insulin pump, carbohydrate counting, and bolusing. Unfortunately, the patient did not eat any breakfast, and he started himself and follow of insulin for visit pump although this was a lower amount than usual. The patient was recently discharged from the hospital at [facility] on 04/19/2012 after a stay for near syncope and pneumonia. The patient has finished  his antibiotics. He has not yet scheduled his outpatient stress test here.otherwise, the patient has been feeling well. He denies any recent fevers, chills, chest discomfort, tightness of breath, nausea, vomiting, diarrhea, abdominal pain, headaches, visual disturbance, focal extremity weakness.  Assessment/Plan:  seizure -Likely hypoglycemia induced -Imaging of the brain and [facility] was negative per report -Patient had MRI of the brain during his last admission on 04/17/2012 which was negative for any acute findings -Observe the patient for signs of aspiration pneumonitis although the patient does not have any shortness of breath or fevers. -EEG -EKG Hypoglycemia -likely due to patient not eating breakfast and still giving himself a bolus on his insulin pump -unfortunately, no one removed the patient's insulin pump from his abdomen during today's events which may have cause the difficulty in correcting his hypoglycemia -check ua -UDS -HIV test -AM cortisol, had am cortisol on 04/17/12 of 27.7 CKD stage III -renal function at baseline Hx of hyperkalemia -pt takes lasix 20mg  bid for this as well as kayexalate 3 times/wk -K was 4.1 DM1 -continue insulin pump at current settings -pt mentation back to baseline and able to eat -basal rate is 1.6units /hr 5PM to MN, 1.2 units from MN to 8AM, 1.5 units from 8AM to 5 PM Delayed Gastric Emptying -had gastric emptying study 04/17/12--delayed emptying -may need reglan Hypothyroidism -04/16/12 TSH 1.7 -continue synthroid current dose       Past Medical History  Diagnosis Date  . IDDM (insulin dependent diabetes mellitus)     38 years  .  Retinopathy     x2  . Proteinuria   . Dyslipidemia   . Anemia, iron deficiency On procrit  . Hematuria, microscopic 10/09    work up negative (Dr. Amalia Hailey)  . Hypothyroidism   . CKD (chronic kidney disease)   . Low HDL (under 40)   . 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 Hyperkalemia    Past Surgical History  Procedure Laterality Date  . Eye surgery  2006    x2   . Vitrectomy  bilateral  . Insulin pump    . Colonoscopy    . Esophagogastroduodenoscopy     Social History:  reports that he has never smoked. He has never used smokeless tobacco. He reports that  drinks alcohol. He reports that he does not use illicit drugs.   Family History  Problem Relation Age of Onset  . Diabetes type I Brother   . Prostate cancer Other      Allergies  Allergen Reactions  . Sulfa Antibiotics Other (See Comments)    High potassium  . Ramipril Cough  . Versed (Midazolam)       Prior to Admission medications   Medication Sig Start Date End Date Taking? Authorizing Provider  acetaminophen (TYLENOL) 500 MG tablet Take 1,000 mg by mouth every 6 (six) hours as needed (pain).     Historical Provider, MD  amoxicillin-clavulanate (AUGMENTIN) 875-125 MG per tablet Take 1 tablet by mouth 2 (two) times daily. 04/19/12   Janece Canterbury, MD  amoxicillin-clavulanate (AUGMENTIN) 875-125 MG per tablet Take 1 tablet by mouth 2 (two) times daily. 04/25/12   Chipper Herb, MD  aspirin 81 MG chewable tablet Chew 81 mg by mouth every morning.    Historical Provider, MD  azithromycin (ZITHROMAX) 250 MG tablet Take 1 tablet (250 mg total) by mouth daily. 04/19/12   Janece Canterbury, MD  Difluprednate (DUREZOL) 0.05 % EMUL Place 1 drop into the right eye 3 (three) times daily.    Historical Provider, MD  epoetin alfa (EPOGEN,PROCRIT) 60454 UNIT/ML injection Inject 20,000 Units into the skin every 30 (thirty) days.    Historical Provider, MD  esomeprazole (NEXIUM) 40 MG capsule Take 40 mg by mouth daily before breakfast.    Historical Provider, MD  ferrous sulfate 325 (65  FE) MG tablet Take 325 mg by mouth 2 (two) times daily.    Historical Provider, MD  finasteride (PROSCAR) 5 MG tablet Take 5 mg by mouth daily.    Historical Provider, MD  furosemide (LASIX) 20 MG tablet Take 20 mg by mouth 2 (two) times daily.    Historical Provider, MD  Ganciclovir 0.15 % GEL Place 1 application into the right eye daily.     Historical Provider, MD  Glucosamine-Chondroit-Vit C-Mn (GLUCOSAMINE 1500 COMPLEX PO) Take 1 tablet by mouth 2 (two) times daily.     Historical Provider, MD  hydrOXYzine (ATARAX/VISTARIL) 10 MG tablet Take 10 mg by mouth 2 (two) times daily. Itching and rash on right arm    Historical Provider, MD  insulin lispro (HUMALOG) 100 UNIT/ML injection Inject into the skin continuous. Pt.  Uses insulin via insulin pump .    Historical Provider, MD  levothyroxine (SYNTHROID, LEVOTHROID) 175 MCG tablet Take 175 mcg by mouth every morning.     Historical Provider, MD  losartan (COZAAR)  25 MG tablet Take 25 mg by mouth every morning.    Historical Provider, MD  metoCLOPramide (REGLAN) 5 MG tablet Take 1 tablet (5 mg total) by mouth 3 (three) times daily before meals. 05/01/12   Chipper Herb, MD  omega-3 acid ethyl esters (LOVAZA) 1 G capsule Take 2 g by mouth 2 (two) times daily.      Historical Provider, MD  simvastatin (ZOCOR) 40 MG tablet Take 40 mg by mouth at bedtime.      Historical Provider, MD  sodium bicarbonate 650 MG tablet Take 650 mg by mouth every morning.     Historical Provider, MD  sodium polystyrene (KAYEXALATE) 15 GM/60ML suspension Take 30 g by mouth 3 (three) times a week. Tues, thur, and saturdays    Historical Provider, MD  testosterone (ANDROGEL) 50 MG/5GM GEL Place 5 g onto the skin daily.     Historical Provider, MD  valACYclovir (VALTREX) 500 MG tablet Take 500 mg by mouth 2 (two) times daily.    Historical Provider, MD  venlafaxine (EFFEXOR) 50 MG tablet Take 50 mg by mouth 2 (two) times daily.      Historical Provider, MD    Review of  Systems:  Constitutional:  No weight loss, night sweats, Fevers, chills, fatigue.  Head&Eyes: No headache.  No vision loss.  No eye pain or scotoma ENT:  No Difficulty swallowing,Tooth/dental problems,Sore throat,  No ear ache, post nasal drip,  Cardio-vascular:  No chest pain, Orthopnea, PND, swelling in lower extremities,  dizziness, palpitations  GI:  No  abdominal pain, nausea, vomiting, diarrhea, loss of appetite, hematochezia, melena, heartburn, indigestion, Resp:  No shortness of breath with exertion or at rest. No cough. No coughing up of blood .No wheezing.No chest wall deformity  Skin:  no rash or lesions.  GU:  no dysuria, change in color of urine, no urgency or frequency. No flank pain.  Musculoskeletal:  No joint pain or swelling. No decreased range of motion. No back pain.  Psych:  No change in mood or affect. No depression or anxiety. Neurologic: No headache, no dysesthesia  Physical Exam: Filed Vitals:   05/16/12 0025  BP: 145/84  Pulse: 100  Temp: 98.2 F (36.8 C)  TempSrc: Oral  Resp: 18  Height: 5\' 10"  (1.778 m)  Weight: 84.1 kg (185 lb 6.5 oz)  SpO2: 98%   General:  A&O x 3, NAD, nontoxic, pleasant/cooperative Head/Eye: No conjunctival hemorrhage, no icterus, [place]/AT, No nystagmus ENT:  No icterus,  No thrush, good dentition, no pharyngeal exudate Neck:  No masses, no lymphadenpathy, no bruits CV:  RRR, no rub, no gallop, no S3 Lung:  CTAB, good air movement, no wheeze, no rhonchi Abdomen: soft/NT, +BS, nondistended, no peritoneal signs Ext: No cyanosis, No rashes, No petechiae, No lymphangitis, No edema Neuro: CNII-XII intact, strength 4/5 in bilateral upper and lower extremities, no dysmetria  Labs on Admission:  Basic Metabolic Panel: No results found for this basename: NA, K, CL, CO2, GLUCOSE, BUN, CREATININE, CALCIUM, MG, PHOS,  in the last 168 hours Liver Function Tests: No results found for this basename: AST, ALT, ALKPHOS, BILITOT, PROT,  ALBUMIN,  in the last 168 hours No results found for this basename: LIPASE, AMYLASE,  in the last 168 hours No results found for this basename: AMMONIA,  in the last 168 hours CBC: No results found for this basename: WBC, NEUTROABS, HGB, HCT, MCV, PLT,  in the last 168 hours Cardiac Enzymes: No results found for this basename: CKTOTAL,  CKMB, CKMBINDEX, TROPONINI,  in the last 168 hours BNP: No components found with this basename: POCBNP,  CBG: No results found for this basename: GLUCAP,  in the last 168 hours  Radiological Exams on Admission: No results found.  EKG: Independently reviewed. pending    Time spent:70 minutes Code Status:   FULL Family Communication:   Family at bedside   Hughes Wyndham, DO  [facility] Pager [phone]  If 7PM-7AM, please contact night-coverage [email or web address] Password [facility] 05/16/2012, 1:39 AM

## 2012-05-17 ENCOUNTER — Telehealth: Payer: Self-pay | Admitting: Infectious Disease

## 2012-05-17 LAB — CORTISOL-AM, BLOOD: Cortisol - AM: 9.6 ug/dL (ref 4.3–22.4)

## 2012-05-17 LAB — URINE CULTURE: Colony Count: NO GROWTH

## 2012-05-17 NOTE — Telephone Encounter (Signed)
Patient tested positive for HIV via HIV Elisa test. He was admitted with possible seizures and had multiple metabolic problems as well. This appears to be a new diagnosis.  Can we get him into [facility] with clinic with Clinic MD this week to discuss test results and obtain further labs, HIV RNA, genotype, CD4

## 2012-05-18 LAB — GLUCOSE, CAPILLARY

## 2012-05-18 NOTE — Progress Notes (Signed)
Pt has appt with Dr Johnnye Sima on 05/20/2012 to discuss results Pt has follow up with you on 05/21/2012

## 2012-05-20 ENCOUNTER — Inpatient Hospital Stay: Payer: BC Managed Care – PPO | Admitting: Infectious Diseases

## 2012-05-20 LAB — HIV ANTIBODY (ROUTINE TESTING W REFLEX): HIV: REACTIVE — AB

## 2012-05-20 NOTE — Discharge Summary (Signed)
Physician Discharge Summary  Patrick Brown [id number] DOB: [birth year] DOA: 05/16/2012  PCP: Redge Gainer, MD  Admit date: 05/16/2012 Discharge date: 05/16/2012  Time spent: 45 minutes  Recommendations for Outpatient Follow-up:  1. PCP in 1 week 2. Neurology Dr.Yan in 1-2 weeks  Discharge Diagnoses:   Seizure  Hypoglycemia   Chronic kidney disease, stage 3, mod decreased GFR   DM (diabetes mellitus), type 1, uncontrolled   Hypothyroid   Anemia, iron deficiency   Gastroparesis    Discharge Condition: stable  Diet recommendation: Carb modified  Filed Weights   05/16/12 0025  Weight: 84.1 kg (185 lb 6.5 oz)    History of present illness:  42 y/o male with hx of DM type I, CKD stage III, hyperlipidemia, deficiency anemia, and hyperkalemia presented to [facility] with seizure. The patient was found to be hypoglycemic with a blood sugar of 60. Patient was given an ampule of D50 W,, but his sugar remained 60. The patient was placed on a D5W drip and sent to the hospital. Apparently he was combative and required 2 doses of Versed, 25 mg each to stop the seizure. The patient did bite his tongue. There was no urine or bladder incontinence. The patient states that he has had a previous episode of hypoglycemic seizure approximately 10 years ago. At that time, his pressure was 9. 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 He usually gets this aura when he is hypoglycemic. He drank some countrytime lemonade and he became more alert again. Unfortunately, this aura occurred again and the next thing he knew, he was in the ED. blood work in the emergency department including BMP and CBC were essentially unremarkable except for a mildly elevated creatinine which is at his baseline. Serum creatinine was 1.63. His baseline is 1.6 at one point. CBC did reveal some thrombocytopenia at 105. Glucose was transported [place] here to [facility] on a  D5W drip. Blood sugar was 290 enroute.      Hospital Course:  seizure  -Hypoglycemia induced  -CT Imaging of the brain and [facility] was negative per report  -Patient had MRI of the brain during his last admission on 04/17/2012 which was negative for any acute findings, exam non focal. -Pt remained stable and I d/w Dr Doy Mince with Neurology and she recommended outpatient follow up with Neurology. -He is advised not to drive or operate machinery till cleared by Neurology to do so.  Hypoglycemia  -likely due to patient not eating breakfast and still giving himself a bolus on his insulin pump  -was weaned off dextrose gtt -back on his insulin pump and CBGs stable, lab work otherwise unremarkable besides chronic anemia -HIV test ordered on admission pending -AM cortisol, had am cortisol on 04/17/12 of 27.7   CKD stage III  -renal function at baseline  -Hx of hyperkalemia  -pt takes lasix 20mg  bid for this as well as kayexalate 3 times/wk  -K stable  DM1  -continue insulin pump at current settings  -pt mentation back to baseline and able to eat  -basal rate is 1.6units /hr 5PM to MN, 1.2 units from MN to 8AM, 1.5 units from 8AM to 5 PM   Hypothyroidism  -04/16/12 TSH 1.7  -continue synthroid current dose      Consultations:  D/W Dr.Reynolds by Telephone  Discharge Exam: Filed Vitals:   05/16/12 0025 05/16/12 0641  BP: 145/84 114/70  Pulse: 100 91  Temp:  98.2 F (36.8 C) 98.3 F (36.8 C)  TempSrc: Oral Oral  Resp: 18 16  Height: 5\' 10"  (1.778 m)   Weight: 84.1 kg (185 lb 6.5 oz)   SpO2: 98% 96%    General: AAOx3 Cardiovascular: S1S2/RRR Respiratory: CTAB  Discharge Instructions  Discharge Orders   Future Appointments Provider Department Dept Phone   05/21/2012 2:00 PM Chipper Herb, MD [place] [phone]   06/02/2012 11:15 AM Thayer Headings, MD [facility] [phone]   06/09/2012  8:30 AM Chipper Herb, MD [place] [phone]   Future Orders Complete By Expires     Diet - low sodium heart healthy  As directed     Diet Carb Modified  As directed     Discharge instructions  As directed     Comments:      Do not drive, operate machinery or any underwater activities till Follow up and cleared by medical doctor or Neurologist        Medication List    TAKE these medications       acetaminophen 500 MG tablet  Commonly known as:  TYLENOL  Take 1,000 mg by mouth every 6 (six) hours as needed (pain).     acetaminophen 500 MG tablet  Commonly known as:  TYLENOL  Take 1,000 mg by mouth 2 (two) times daily. scheduled     aspirin 81 MG chewable tablet  Chew 81 mg by mouth every morning.     DUREZOL 0.05 % Emul  Generic drug:  Difluprednate  Place 1 drop into the right eye 3 (three) times daily.     EFFEXOR 50 MG tablet  Generic drug:  venlafaxine  Take 50 mg by mouth 2 (two) times daily.     epoetin alfa 20000 UNIT/ML injection  Commonly known as:  EPOGEN,PROCRIT  Inject 20,000 Units into the skin every 30 (thirty) days.     esomeprazole 40 MG capsule  Commonly known as:  NEXIUM  Take 40 mg by mouth daily before breakfast.     ferrous sulfate 325 (65 FE) MG tablet  Take 325 mg by mouth 2 (two) times daily.     furosemide 20 MG tablet  Commonly known as:  LASIX  Take 20 mg by mouth 2 (two) times daily.     Ganciclovir 0.15 % Gel  Place 1 application into the right eye daily.     GLUCOSAMINE 1500 COMPLEX PO  Take 1 tablet by mouth 2 (two) times daily.     HUMALOG 100 UNIT/ML injection  Generic drug:  insulin lispro  Inject into the skin continuous. Pt.  Uses insulin via insulin pump .     hydrOXYzine 25 MG tablet  Commonly known as:  ATARAX/VISTARIL  Take 25 mg by mouth 2 (two) times daily.     levothyroxine 175 MCG tablet  Commonly known as:  SYNTHROID, LEVOTHROID  Take 175 mcg by mouth every morning.      losartan 25 MG tablet  Commonly known as:  COZAAR  Take 25 mg by mouth every morning.     metoCLOPramide 5 MG tablet  Commonly known as:  REGLAN  Take 1 tablet (5 mg total) by mouth 3 (three) times daily before meals.     omega-3 acid ethyl esters 1 G capsule  Commonly known as:  LOVAZA  Take 2 g by mouth 2 (two) times daily.     simvastatin 40 MG tablet  Commonly  known as:  ZOCOR  Take 40 mg by mouth at bedtime.     sodium bicarbonate 650 MG tablet  Take 650 mg by mouth every morning.     sodium polystyrene 15 GM/60ML suspension  Commonly known as:  KAYEXALATE  Take 30 g by mouth 3 (three) times a week. Tues, thur, and saturdays     testosterone 50 MG/5GM Gel  Commonly known as:  ANDROGEL  Place 5 g onto the skin daily.     valACYclovir 500 MG tablet  Commonly known as:  VALTREX  Take 500 mg by mouth 2 (two) times daily.           Follow-up Information   Follow up with Redge Gainer, MD On 05/21/2012.   Contact information:   [place] [address] [phone]       Follow up with Marcial Pacas, MD In 2 weeks. (as needed)    Contact information:   [address] [place] [address] [phone]        The results of significant diagnostics from this hospitalization (including imaging, microbiology, ancillary and laboratory) are listed below for reference.    Significant Diagnostic Studies: Dg Chest 2 View  05/01/2012  *RADIOLOGY REPORT*  Clinical Data: Community acquired pneumonia  CHEST - 2 VIEW  Comparison: 04/18/2012  Findings: Cardiomediastinal silhouette is stable.  No acute infiltrate or pleural effusion.  No pulmonary edema.  Stable compression deformities mid thoracic spine.  IMPRESSION: No active disease.  Stable compression deformities mid thoracic spine.   Original Report Authenticated By: Lahoma Crocker, M.D.     Microbiology: Recent Results (from the past 240 hour(s))  URINE CULTURE     Status: None   Collection Time     05/16/12  5:11 AM      Result Value Range Status   Specimen Description URINE, RANDOM   Final   Special Requests NONE   Final   Culture  Setup Time 05/16/2012 12:06   Final   Colony Count NO GROWTH   Final   Culture NO GROWTH   Final   Report Status 05/17/2012 FINAL   Final     Labs: Basic Metabolic Panel:  Recent Labs Lab 05/16/12 0540  NA 142  K 3.5  CL 104  CO2 29  GLUCOSE 111*  BUN 33*  CREATININE 1.37*  CALCIUM 9.1  MG 1.7   Liver Function Tests:  Recent Labs Lab 05/16/12 0540  AST 49*  ALT 38  ALKPHOS 96  BILITOT 0.3  PROT 6.6  ALBUMIN 3.1*   No results found for this basename: LIPASE, AMYLASE,  in the last 168 hours No results found for this basename: AMMONIA,  in the last 168 hours CBC:  Recent Labs Lab 05/16/12 0540  WBC 4.9  NEUTROABS 2.2  HGB 10.6*  HCT 32.0*  MCV 87.2  PLT 126*   Cardiac Enzymes:  Recent Labs Lab 05/16/12 0540  CKTOTAL 3190*   BNP: BNP (last 3 results)  Recent Labs  04/16/12 2020 04/16/12 2220  PROBNP 66.5 94.3   CBG:  Recent Labs Lab 05/16/12 0025 05/16/12 0745  GLUCAP 257* 79       Signed:  Akash Winski  [facility] 05/20/2012, 10:26 PM

## 2012-05-21 ENCOUNTER — Ambulatory Visit (INDEPENDENT_AMBULATORY_CARE_PROVIDER_SITE_OTHER): Payer: BC Managed Care – PPO | Admitting: Family Medicine

## 2012-05-21 ENCOUNTER — Ambulatory Visit (INDEPENDENT_AMBULATORY_CARE_PROVIDER_SITE_OTHER): Payer: BC Managed Care – PPO

## 2012-05-21 ENCOUNTER — Encounter: Payer: Self-pay | Admitting: Family Medicine

## 2012-05-21 VITALS — BP 113/66 | HR 112 | Temp 97.7°F | Ht 70.0 in | Wt 182.4 lb

## 2012-05-21 DIAGNOSIS — L0291 Cutaneous abscess, unspecified: Secondary | ICD-10-CM

## 2012-05-21 DIAGNOSIS — IMO0002 Reserved for concepts with insufficient information to code with codable children: Secondary | ICD-10-CM

## 2012-05-21 DIAGNOSIS — L0390 Cellulitis, unspecified: Secondary | ICD-10-CM

## 2012-05-21 DIAGNOSIS — J189 Pneumonia, unspecified organism: Secondary | ICD-10-CM

## 2012-05-21 DIAGNOSIS — N183 Chronic kidney disease, stage 3 unspecified: Secondary | ICD-10-CM

## 2012-05-21 DIAGNOSIS — IMO0001 Reserved for inherently not codable concepts without codable children: Secondary | ICD-10-CM

## 2012-05-21 DIAGNOSIS — R569 Unspecified convulsions: Secondary | ICD-10-CM

## 2012-05-21 DIAGNOSIS — E1065 Type 1 diabetes mellitus with hyperglycemia: Secondary | ICD-10-CM

## 2012-05-21 LAB — POCT CBC
Hemoglobin: 12.4 g/dL — AB (ref 14.1–18.1)
Lymph, poc: 1.5 (ref 0.6–3.4)
MCH, POC: 30.7 pg (ref 27–31.2)
MCHC: 34.4 g/dL (ref 31.8–35.4)
MPV: 6.5 fL (ref 0–99.8)
POC Granulocyte: 5 (ref 2–6.9)
POC LYMPH PERCENT: 20.5 %L (ref 10–50)
Platelet Count, POC: 158 10*3/uL (ref 142–424)

## 2012-05-21 MED ORDER — CIPROFLOXACIN HCL 250 MG PO TABS: 250.0000 mg | ORAL_TABLET | Freq: Two times a day (BID) | ORAL | Status: DC

## 2012-05-21 NOTE — Patient Instructions (Addendum)
Take antibiotics as directed The neurologist as soon as possible No driving until visit to the neurology, no back to work until visit with neurology  Keep other appointments as planned Return to clinic in 4 weeks

## 2012-05-21 NOTE — Progress Notes (Signed)
  Subjective:    Patient ID: Patrick Brown, male    DOB: May 02, 1970, 42 y.o.   MRN: [id number]  HPI [name] returns today with his mom for followup of pneumonia. He had a recent hospitalization for seizure thinking that it was a low blood sugar as the cause. He's also developed some sores on the back of his head.       Review of Systems  Constitutional: Positive for fatigue (due to Versed).  HENT: Negative.   Eyes: Positive for visual disturbance (R eye).  Respiratory: Negative.   Cardiovascular: Negative.   Gastrointestinal: Positive for diarrhea. Blood in stool: couple of episodes.  Genitourinary: Negative.   Musculoskeletal: Negative.   Skin: Positive for rash (top of head).  Neurological: Positive for seizures (one, tonic-clonic).  Psychiatric/Behavioral: Negative.        Objective:   Physical Exam  Nursing note and vitals reviewed. Constitutional: He is oriented to person, place, and time. He appears well-developed and well-nourished.  HENT:  Head: Normocephalic and atraumatic.  Right Ear: External ear normal.  Left Ear: External ear normal.  Nose: Nose normal.  Mouth/Throat: Oropharynx is clear and moist.  Eyes: Conjunctivae are normal. Right eye exhibits no discharge. Left eye exhibits no discharge.  Neck: Normal range of motion. Neck supple. No thyromegaly present.  Cardiovascular: Normal rate, regular rhythm, normal heart sounds and intact distal pulses.  Exam reveals no gallop.   No murmur heard. Pulmonary/Chest: Effort normal and breath sounds normal. No respiratory distress. He has no wheezes. He has no rales.  Abdominal: Soft. He exhibits no mass. There is no tenderness. There is no guarding.  Musculoskeletal: Normal range of motion.  Lymphadenopathy:    He has no cervical adenopathy.  Neurological: He is alert and oriented to person, place, and time.  Skin: Skin is warm and dry.  Several sores on the back of the head. From 1 of these  some drainage obtained  for culture and sensitivity  Psychiatric: He has a normal mood and affect. His behavior is normal. Judgment and thought content normal.   [facility] reading (PRIMARY) by  Dr.Don Laurance Flatten : Chest x-ray appears normal                                       Assessment & Plan:  1. CAP (community acquired pneumonia) - POCT CBC - DG Chest 2 View; Future  2. Chronic kidney disease, stage 3, mod decreased GFR - BASIC METABOLIC PANEL WITH GFR  3. DM (diabetes mellitus), type 1, uncontrolled  4. Cellulitis - ciprofloxacin (CIPRO) 250 MG tablet; Take 1 tablet (250 mg total) by mouth 2 (two) times daily.  Dispense: 20 tablet; Refill: 0 - POCT CBC - Wound culture  5. Convulsions/seizures - Ambulatory referral to Neurology

## 2012-05-22 LAB — BASIC METABOLIC PANEL WITH GFR
CO2: 27 mEq/L (ref 19–32)
Chloride: 100 mEq/L (ref 96–112)
Creat: 1.7 mg/dL — ABNORMAL HIGH (ref 0.50–1.35)
Potassium: 4.6 mEq/L (ref 3.5–5.3)

## 2012-05-24 LAB — WOUND CULTURE
Gram Stain: NONE SEEN
Gram Stain: NONE SEEN

## 2012-05-25 ENCOUNTER — Encounter: Payer: Self-pay | Admitting: *Deleted

## 2012-05-28 ENCOUNTER — Telehealth: Payer: Self-pay | Admitting: *Deleted

## 2012-05-29 ENCOUNTER — Other Ambulatory Visit: Payer: Self-pay | Admitting: *Deleted

## 2012-05-29 MED ORDER — LEVOTHYROXINE SODIUM 175 MCG PO TABS: 175.0000 ug | ORAL_TABLET | Freq: Every morning | ORAL | Status: DC

## 2012-06-01 ENCOUNTER — Telehealth: Payer: Self-pay | Admitting: *Deleted

## 2012-06-01 NOTE — Telephone Encounter (Signed)
Please have a clinical pharmacist to address the equivalent dosage from going from 25-3 daily to 37.5mg . ?how many daily. It is okay with me.

## 2012-06-01 NOTE — Telephone Encounter (Signed)
Pt. Wants to change his Effexor 25mg . 4 tabs qd to Effexor XR 37.5 3 Tabs qd? pls advise and let pt know

## 2012-06-01 NOTE — H&P (Signed)
[facility] History and Physical  DAMARE URY [id number] DOB: 1970/08/07 DOA: 05/16/2012   PCP: Redge Gainer, MD   Chief Complaint: seizure  HPI:  42 y/o male with hx of DM type I, CKD stage III, hyperlipidemia, deficiency anemia, and hyperkalemia presented to [facility] with seizure. The patient was found to be hypoglycemic with a blood sugar of 60. Patient was given an ampule of D50 W,, but his sugar remained 60. The patient was placed on a D5W drip and sent to the hospital. Apparently he was combative and required 2 doses of Versed, 25 mg each to stop the seizure. The patient did bite his tongue. There was no urine or bladder incontinence. The patient states that he has had a previous episode of hypoglycemic seizure approximately 10 years ago. At that time, his pressure was 9. 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  He usually gets this aura when he is hypoglycemic.  He drank some countrytime lemonade and he became more alert again.  Unfortunately, this aura occurred again and the next thing he knew, he was in the ED. blood work in the emergency department including BMP and CBC were essentially unremarkable except for a mildly elevated creatinine which is at his baseline. Serum creatinine was 1.63. His baseline is 1.6 at one point. CBC did reveal some thrombocytopenia at 105. Glucose was transported [place] here to [facility] on a D5W drip.  Blood sugar was 290 enroute.  The patient's most recent hemoglobin A1c on 04/17/2012 was 7.9. The patient is very knowledgeable regarding his insulin pump, carbohydrate counting, and bolusing. Unfortunately, the patient did not eat any breakfast, and he started himself and follow of insulin for visit pump although this was a lower amount than usual. The patient was recently discharged from the hospital at [facility] on 04/19/2012 after a stay for near syncope and pneumonia. The patient has finished  his antibiotics. He has not yet scheduled his outpatient stress test here.otherwise, the patient has been feeling well. He denies any recent fevers, chills, chest discomfort, tightness of breath, nausea, vomiting, diarrhea, abdominal pain, headaches, visual disturbance, focal extremity weakness.  Assessment/Plan:  seizure -Likely hypoglycemia induced -Imaging of the brain and [facility] was negative per report -Patient had MRI of the brain during his last admission on 04/17/2012 which was negative for any acute findings -Observe the patient for signs of aspiration pneumonitis although the patient does not have any shortness of breath or fevers. -EEG -EKG Hypoglycemia -likely due to patient not eating breakfast and still giving himself a bolus on his insulin pump -unfortunately, no one removed the patient's insulin pump from his abdomen during today's events which may have cause the difficulty in correcting his hypoglycemia -check ua -UDS -HIV test -AM cortisol, had am cortisol on 04/17/12 of 27.7 CKD stage III -renal function at baseline Hx of hyperkalemia -pt takes lasix 20mg  bid for this as well as kayexalate 3 times/wk -K was 4.1 DM1 -continue insulin pump at current settings -pt mentation back to baseline and able to eat -basal rate is 1.6units /hr 5PM to MN, 1.2 units from MN to 8AM, 1.5 units from 8AM to 5 PM Delayed Gastric Emptying -had gastric emptying study 04/17/12--delayed emptying -may need reglan Hypothyroidism -04/16/12 TSH 1.7 -continue synthroid current dose       Past Medical History  Diagnosis Date  . IDDM (insulin dependent diabetes mellitus)     38 years  .  Retinopathy     x2  . Proteinuria   . Dyslipidemia   . Anemia, iron deficiency On procrit  . Hematuria, microscopic 10/09    work up negative (Dr. Amalia Hailey)  . Hypothyroidism   . CKD (chronic kidney disease)   . Low HDL (under 40)   . 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 Hyperkalemia    Past Surgical History  Procedure Laterality Date  . Eye surgery  2006    x2   . Vitrectomy  bilateral  . Insulin pump    . Colonoscopy    . Esophagogastroduodenoscopy     Social History:  reports that he has never smoked. He has never used smokeless tobacco. He reports that  drinks alcohol. He reports that he does not use illicit drugs.   Family History  Problem Relation Age of Onset  . Diabetes type I Brother   . Prostate cancer Other      Allergies  Allergen Reactions  . Sulfa Antibiotics Other (See Comments)    High potassium  . Ramipril Cough  . Versed (Midazolam)       Prior to Admission medications   Medication Sig Start Date End Date Taking? Authorizing Provider  acetaminophen (TYLENOL) 500 MG tablet Take 1,000 mg by mouth every 6 (six) hours as needed (pain).     Historical Provider, MD  amoxicillin-clavulanate (AUGMENTIN) 875-125 MG per tablet Take 1 tablet by mouth 2 (two) times daily. 04/19/12   Janece Canterbury, MD  amoxicillin-clavulanate (AUGMENTIN) 875-125 MG per tablet Take 1 tablet by mouth 2 (two) times daily. 04/25/12   Chipper Herb, MD  aspirin 81 MG chewable tablet Chew 81 mg by mouth every morning.    Historical Provider, MD  azithromycin (ZITHROMAX) 250 MG tablet Take 1 tablet (250 mg total) by mouth daily. 04/19/12   Janece Canterbury, MD  Difluprednate (DUREZOL) 0.05 % EMUL Place 1 drop into the right eye 3 (three) times daily.    Historical Provider, MD  epoetin alfa (EPOGEN,PROCRIT) 16109 UNIT/ML injection Inject 20,000 Units into the skin every 30 (thirty) days.    Historical Provider, MD  esomeprazole (NEXIUM) 40 MG capsule Take 40 mg by mouth daily before breakfast.    Historical Provider, MD  ferrous sulfate 325 (65  FE) MG tablet Take 325 mg by mouth 2 (two) times daily.    Historical Provider, MD  finasteride (PROSCAR) 5 MG tablet Take 5 mg by mouth daily.    Historical Provider, MD  furosemide (LASIX) 20 MG tablet Take 20 mg by mouth 2 (two) times daily.    Historical Provider, MD  Ganciclovir 0.15 % GEL Place 1 application into the right eye daily.     Historical Provider, MD  Glucosamine-Chondroit-Vit C-Mn (GLUCOSAMINE 1500 COMPLEX PO) Take 1 tablet by mouth 2 (two) times daily.     Historical Provider, MD  hydrOXYzine (ATARAX/VISTARIL) 10 MG tablet Take 10 mg by mouth 2 (two) times daily. Itching and rash on right arm    Historical Provider, MD  insulin lispro (HUMALOG) 100 UNIT/ML injection Inject into the skin continuous. Pt.  Uses insulin via insulin pump .    Historical Provider, MD  levothyroxine (SYNTHROID, LEVOTHROID) 175 MCG tablet Take 175 mcg by mouth every morning.     Historical Provider, MD  losartan (COZAAR)  25 MG tablet Take 25 mg by mouth every morning.    Historical Provider, MD  metoCLOPramide (REGLAN) 5 MG tablet Take 1 tablet (5 mg total) by mouth 3 (three) times daily before meals. 05/01/12   Chipper Herb, MD  omega-3 acid ethyl esters (LOVAZA) 1 G capsule Take 2 g by mouth 2 (two) times daily.      Historical Provider, MD  simvastatin (ZOCOR) 40 MG tablet Take 40 mg by mouth at bedtime.      Historical Provider, MD  sodium bicarbonate 650 MG tablet Take 650 mg by mouth every morning.     Historical Provider, MD  sodium polystyrene (KAYEXALATE) 15 GM/60ML suspension Take 30 g by mouth 3 (three) times a week. Tues, thur, and saturdays    Historical Provider, MD  testosterone (ANDROGEL) 50 MG/5GM GEL Place 5 g onto the skin daily.     Historical Provider, MD  valACYclovir (VALTREX) 500 MG tablet Take 500 mg by mouth 2 (two) times daily.    Historical Provider, MD  venlafaxine (EFFEXOR) 50 MG tablet Take 50 mg by mouth 2 (two) times daily.      Historical Provider, MD    Review of  Systems:  Constitutional:  No weight loss, night sweats, Fevers, chills, fatigue.  Head&Eyes: No headache.  No vision loss.  No eye pain or scotoma ENT:  No Difficulty swallowing,Tooth/dental problems,Sore throat,  No ear ache, post nasal drip,  Cardio-vascular:  No chest pain, Orthopnea, PND, swelling in lower extremities,  dizziness, palpitations  GI:  No  abdominal pain, nausea, vomiting, diarrhea, loss of appetite, hematochezia, melena, heartburn, indigestion, Resp:  No shortness of breath with exertion or at rest. No cough. No coughing up of blood .No wheezing.No chest wall deformity  Skin:  no rash or lesions.  GU:  no dysuria, change in color of urine, no urgency or frequency. No flank pain.  Musculoskeletal:  No joint pain or swelling. No decreased range of motion. No back pain.  Psych:  No change in mood or affect. No depression or anxiety. Neurologic: No headache, no dysesthesia  Physical Exam: Filed Vitals:   05/16/12 0025 05/16/12 0641  BP: 145/84 114/70  Pulse: 100 91  Temp: 98.2 F (36.8 C) 98.3 F (36.8 C)  TempSrc: Oral Oral  Resp: 18 16  Height: 5\' 10"  (1.778 m)   Weight: 84.1 kg (185 lb 6.5 oz)   SpO2: 98% 96%   General:  A&O x 3, NAD, nontoxic, pleasant/cooperative Head/Eye: No conjunctival hemorrhage, no icterus, [place]/AT, No nystagmus ENT:  No icterus,  No thrush, good dentition, no pharyngeal exudate Neck:  No masses, no lymphadenpathy, no bruits CV:  RRR, no rub, no gallop, no S3 Lung:  CTAB, good air movement, no wheeze, no rhonchi Abdomen: soft/NT, +BS, nondistended, no peritoneal signs Ext: No cyanosis, No rashes, No petechiae, No lymphangitis, No edema Neuro: CNII-XII intact, strength 4/5 in bilateral upper and lower extremities, no dysmetria  Labs on Admission:  Basic Metabolic Panel: No results found for this basename: NA, K, CL, CO2, GLUCOSE, BUN, CREATININE, CALCIUM, MG, PHOS,  in the last 168 hours Liver Function Tests: No results  found for this basename: AST, ALT, ALKPHOS, BILITOT, PROT, ALBUMIN,  in the last 168 hours No results found for this basename: LIPASE, AMYLASE,  in the last 168 hours No results found for this basename: AMMONIA,  in the last 168 hours CBC: No results found for this basename: WBC, NEUTROABS, HGB, HCT, MCV, PLT,  in  the last 168 hours Cardiac Enzymes: No results found for this basename: CKTOTAL, CKMB, CKMBINDEX, TROPONINI,  in the last 168 hours BNP: No components found with this basename: POCBNP,  CBG: No results found for this basename: GLUCAP,  in the last 168 hours  Radiological Exams on Admission: No results found.  EKG: Independently reviewed. pending    Time spent:70 minutes Code Status:   FULL Family Communication:   Family at bedside   Riyansh Gerstner, DO  [facility] Pager [phone]  If 7PM-7AM, please contact night-coverage [email or web address] Password [email or web address] 06/01/2012, 9:30 AM

## 2012-06-02 ENCOUNTER — Telehealth: Payer: Self-pay | Admitting: *Deleted

## 2012-06-02 ENCOUNTER — Other Ambulatory Visit: Payer: Self-pay | Admitting: Internal Medicine

## 2012-06-02 ENCOUNTER — Encounter: Payer: Self-pay | Admitting: Internal Medicine

## 2012-06-02 ENCOUNTER — Ambulatory Visit (INDEPENDENT_AMBULATORY_CARE_PROVIDER_SITE_OTHER): Payer: BC Managed Care – PPO | Admitting: Internal Medicine

## 2012-06-02 VITALS — BP 136/87 | HR 98 | Temp 98.2°F | Ht 70.0 in | Wt 187.0 lb

## 2012-06-02 DIAGNOSIS — B20 Human immunodeficiency virus [HIV] disease: Secondary | ICD-10-CM

## 2012-06-02 MED ORDER — VENLAFAXINE HCL ER 37.5 MG PO CP24: 112.5000 mg | ORAL_CAPSULE | Freq: Every day | ORAL | Status: DC

## 2012-06-02 NOTE — Telephone Encounter (Signed)
Left message on voicemail that script had been sent electronically to pharmacy.

## 2012-06-02 NOTE — Telephone Encounter (Signed)
Spoke with pt, meds verified

## 2012-06-02 NOTE — Telephone Encounter (Signed)
Called patient to clarify dosing of effexor before making recommendation - left message on VM Electronic chart indicates that patient is taking 50mg (? 2-25mg ) twice daily  (=200mg  daily) But note below mentions 3 tablets daily of effexor 25mg .

## 2012-06-02 NOTE — Progress Notes (Signed)
  Subjective:    Patient ID: Patrick Brown, male    DOB: 10/21/1970, 42 y.o.   MRN: [id number]  HPI He comes in here as a new patient for evaluation of a recent positive Elisa for HIV.  He was tested routinely during a recent hospitalization for hypoglycemia and seizures secondary to the low sugar. The Elisa a was positive however it was not sent for confirmation do to not having enough blood. He does have a history of syphilis, MSM, no gonorrhea or chlamydia. His last test full activity was about 2 years ago.  He does usually use condoms. He does not recall any acute illness though did note about 2 years ago that he had some weight loss and was seen by gastroenterology in diagnosed with what sounds like irritable bowel syndrome. This resolved and he has had no other further problems. His weight loss stabilized   Review of Systems  Constitutional: Negative for fever, chills, activity change, appetite change, fatigue and unexpected weight change.  HENT: Negative for sore throat and trouble swallowing.   Cardiovascular: Negative for chest pain, palpitations and leg swelling.  Gastrointestinal: Negative for nausea, abdominal pain and diarrhea.  Musculoskeletal: Negative for myalgias, joint swelling and arthralgias.  Neurological: Negative for dizziness, light-headedness and headaches.  Psychiatric/Behavioral: The patient is not nervous/anxious.        Objective:   Physical Exam  Constitutional: He appears well-developed and well-nourished. No distress.  HENT:  Mouth/Throat: Oropharynx is clear and moist. No oropharyngeal exudate.  Cardiovascular: Normal rate, regular rhythm and normal heart sounds.  Exam reveals no gallop and no friction rub.   No murmur heard. Pulmonary/Chest: Effort normal and breath sounds normal. No respiratory distress. He has no wheezes. He has no rales.  Abdominal: Soft. Bowel sounds are normal. He exhibits no distension. There is no tenderness. There is no rebound.   Lymphadenopathy:    He has no cervical adenopathy.  Skin: No rash noted.          Assessment & Plan:

## 2012-06-02 NOTE — Assessment & Plan Note (Signed)
He is screen positive and I will check a Western blot and viral load. He will come back in 2 weeks for results. He does have significant risk factors. I did discuss with him long-term prognosis of infection in that it is very treatable with medication. All questions answered.

## 2012-06-02 NOTE — Addendum Note (Signed)
Addended by: Ilean China on: 06/02/2012 06:07 PM   Modules accepted: Orders

## 2012-06-02 NOTE — Telephone Encounter (Signed)
Recommend change to Effexor XR 37.5mg  3 capsules once daily (total daily dose is 112.5mg ) Previous dose of regular release effexor was 50mg  BID or 100mg /day. Will route to nursing staff to have them call in for patient

## 2012-06-02 NOTE — Telephone Encounter (Signed)
Called to clarify patient current dosage of effexor as records state he is

## 2012-06-03 ENCOUNTER — Encounter (HOSPITAL_COMMUNITY): Payer: BC Managed Care – PPO

## 2012-06-04 ENCOUNTER — Other Ambulatory Visit: Payer: Self-pay | Admitting: Internal Medicine

## 2012-06-04 DIAGNOSIS — B20 Human immunodeficiency virus [HIV] disease: Secondary | ICD-10-CM

## 2012-06-04 NOTE — Addendum Note (Signed)
Addended byMarlis Edelson on: 06/04/2012 02:19 PM   Modules accepted: Orders

## 2012-06-08 ENCOUNTER — Ambulatory Visit: Payer: BC Managed Care – PPO | Admitting: Neurology

## 2012-06-09 ENCOUNTER — Ambulatory Visit (INDEPENDENT_AMBULATORY_CARE_PROVIDER_SITE_OTHER): Payer: BC Managed Care – PPO | Admitting: Family Medicine

## 2012-06-09 ENCOUNTER — Encounter: Payer: Self-pay | Admitting: Family Medicine

## 2012-06-09 VITALS — BP 102/57 | HR 91 | Temp 97.5°F | Ht 70.0 in | Wt 183.8 lb

## 2012-06-09 DIAGNOSIS — J189 Pneumonia, unspecified organism: Secondary | ICD-10-CM

## 2012-06-09 DIAGNOSIS — E039 Hypothyroidism, unspecified: Secondary | ICD-10-CM

## 2012-06-09 DIAGNOSIS — N183 Chronic kidney disease, stage 3 unspecified: Secondary | ICD-10-CM

## 2012-06-09 DIAGNOSIS — E785 Hyperlipidemia, unspecified: Secondary | ICD-10-CM

## 2012-06-09 DIAGNOSIS — E119 Type 2 diabetes mellitus without complications: Secondary | ICD-10-CM

## 2012-06-09 DIAGNOSIS — D649 Anemia, unspecified: Secondary | ICD-10-CM

## 2012-06-09 DIAGNOSIS — IMO0002 Reserved for concepts with insufficient information to code with codable children: Secondary | ICD-10-CM

## 2012-06-09 DIAGNOSIS — R569 Unspecified convulsions: Secondary | ICD-10-CM

## 2012-06-09 DIAGNOSIS — E1065 Type 1 diabetes mellitus with hyperglycemia: Secondary | ICD-10-CM

## 2012-06-09 DIAGNOSIS — E559 Vitamin D deficiency, unspecified: Secondary | ICD-10-CM

## 2012-06-09 LAB — POCT CBC
Granulocyte percent: 71.5 %G (ref 37–80)
Lymph, poc: 1.4 (ref 0.6–3.4)
MCH, POC: 29.4 pg (ref 27–31.2)
MCV: 88.2 fL (ref 80–97)
POC LYMPH PERCENT: 20.8 %L (ref 10–50)
Platelet Count, POC: 162 10*3/uL (ref 142–424)
RDW, POC: 14.2 %
WBC: 6.5 10*3/uL (ref 4.6–10.2)

## 2012-06-09 LAB — BASIC METABOLIC PANEL WITH GFR
BUN: 44 mg/dL — ABNORMAL HIGH (ref 6–23)
Calcium: 9.2 mg/dL (ref 8.4–10.5)
GFR, Est African American: 57 mL/min — ABNORMAL LOW
Glucose, Bld: 196 mg/dL — ABNORMAL HIGH (ref 70–99)

## 2012-06-09 LAB — THYROID PANEL WITH TSH
T3 Uptake: 29.6 % (ref 22.5–37.0)
T4, Total: 14.8 ug/dL — ABNORMAL HIGH (ref 5.0–12.5)

## 2012-06-09 LAB — HEPATIC FUNCTION PANEL
ALT: 36 U/L (ref 0–53)
Albumin: 4.3 g/dL (ref 3.5–5.2)
Total Protein: 6.9 g/dL (ref 6.0–8.3)

## 2012-06-09 NOTE — Progress Notes (Signed)
  Subjective:    Patient ID: Patrick Brown, male    DOB: [birth year], 42 y.o.   MRN: [id number]  HPI This patient presents for recheck of multiple medical problems. His mom accompanies the patient today.  Patient Active Problem List   Diagnosis Date Noted  . HIV disease 06/02/2012  . Convulsions/seizures 05/16/2012  . Fever, unspecified 04/18/2012  . SIRS (systemic inflammatory response syndrome) 04/18/2012  . CAP (community acquired pneumonia) 04/18/2012  . Gastroparesis 04/18/2012  . Sinus tachycardia 04/18/2012  . Near syncope 04/16/2012  . Anemia, iron deficiency   . DM (diabetes mellitus), type 1, uncontrolled 05/08/2011  . Hypothyroid 05/08/2011  . Chronic kidney disease, stage 3, mod decreased GFR 12/31/2010    In addition, other than her recent gastrointestinal virus he is doing much better and feeling much stronger and feeling more like himself. He has appointment scheduled with Dr. Jannifer Franklin the neurologist and Dr. Otho Bellows the dermatologist.  The allergies, current medications, past medical history, surgical history, family and social history are reviewed.  Immunizations reviewed.  Health maintenance reviewed.  The following items are outstanding:none.     Review of Systems  Constitutional: Positive for activity change (improving) and appetite change (improving).  HENT: Positive for postnasal drip (due to allergies).   Eyes: Negative.   Respiratory: Negative.   Cardiovascular: Negative.   Gastrointestinal: Positive for diarrhea (yesterday).  Endocrine: Negative.   Genitourinary: Negative.   Musculoskeletal: Negative.   Skin: Positive for rash (head, arms and chest).  Allergic/Immunologic: Positive for environmental allergies (seasonal).  Neurological: Negative.   Psychiatric/Behavioral: The patient is nervous/anxious (controlled with meds).        Objective:   Physical Exam BP 102/57  Pulse 91  Temp(Src) 97.5 F (36.4 C) (Oral)  Ht 5\' 10"  (1.778 m)   Wt 183 lb 12.8 oz (83.371 kg)  BMI 26.37 kg/m2  The patient appeared well nourished and normally developed, alert and oriented to time and place. Speech, behavior and judgement appear normal. Vital signs as documented.  Head exam is unremarkable. No scleral icterus or pallor noted.  Neck is without jugular venous distension, thyromegally, or carotid bruits. Carotid upstrokes are brisk bilaterally. No cervical adenopathy. Lungs are clear anteriorly and posteriorly to auscultation. Normal respiratory effort. Cardiac exam reveals regular rate and rhythm. First and second heart sounds normal.  No murmurs, rubs or gallops.  Abdominal exam reveals normal bowl sounds, no masses, no organomegaly and no aortic enlargement. No inguinal adenopathy. Extremities are nonedematous. Skin without pallor or jaundice.  Warm and dry, without rash. There were excoriations on his occipital scalp.           Assessment & Plan:  1. Diabetes - BASIC METABOLIC PANEL WITH GFR; Standing - BASIC METABOLIC PANEL WITH GFR  2. Hyperlipemia - Hepatic function panel; Standing - NMR Lipoprofile with Lipids; Standing - Hepatic function panel - NMR Lipoprofile with Lipids  3. Vitamin D deficiency - Vitamin D 25 hydroxy; Standing - Vitamin D 25 hydroxy  4. Hypothyroid - Thyroid Panel With TSH  5. Anemia - POCT CBC; Standing - POCT CBC  6. CAP (community acquired pneumonia)  8. Convulsions/seizures  8. DM (diabetes mellitus), type 1, uncontrolled  9. Chronic kidney disease, stage 3, mod decreased GFR   Patient Instructions  Keep appointments with Dr. Jannifer Franklin and Dr. Sherrye Payor and see these referral doctors as soon as possible He is much rest as possible and begin exercising Continue current medications

## 2012-06-09 NOTE — Patient Instructions (Signed)
Keep appointments with Dr. Jannifer Franklin and Dr. Sherrye Payor and see these referral doctors as soon as possible He is much rest as possible and begin exercising Continue current medications

## 2012-06-10 LAB — VITAMIN D 25 HYDROXY (VIT D DEFICIENCY, FRACTURES): Vit D, 25-Hydroxy: 67 ng/mL (ref 30–89)

## 2012-06-11 ENCOUNTER — Other Ambulatory Visit (INDEPENDENT_AMBULATORY_CARE_PROVIDER_SITE_OTHER): Payer: BC Managed Care – PPO

## 2012-06-11 ENCOUNTER — Encounter: Payer: Self-pay | Admitting: Neurology

## 2012-06-11 ENCOUNTER — Ambulatory Visit (INDEPENDENT_AMBULATORY_CARE_PROVIDER_SITE_OTHER): Payer: BC Managed Care – PPO | Admitting: Neurology

## 2012-06-11 ENCOUNTER — Telehealth: Payer: Self-pay | Admitting: Neurology

## 2012-06-11 VITALS — BP 112/65 | HR 85 | Ht 70.0 in | Wt 186.0 lb

## 2012-06-11 DIAGNOSIS — R569 Unspecified convulsions: Secondary | ICD-10-CM

## 2012-06-11 DIAGNOSIS — B20 Human immunodeficiency virus [HIV] disease: Secondary | ICD-10-CM

## 2012-06-11 DIAGNOSIS — Z0289 Encounter for other administrative examinations: Secondary | ICD-10-CM

## 2012-06-11 LAB — NMR LIPOPROFILE WITH LIPIDS
LDL (calc): 55 mg/dL (ref <100)
LDL Particle Number: 1125 nmol/L — ABNORMAL HIGH (ref <1000)
LDL Size: 19.8 nm — ABNORMAL LOW (ref >20.5)
LP-IR Score: 65 — ABNORMAL HIGH (ref <=45)
Small LDL Particle Number: 892 nmol/L — ABNORMAL HIGH (ref <=527)
VLDL Size: 44 nm (ref <=46.6)

## 2012-06-11 LAB — HIV-1 GENOTYPR PLUS

## 2012-06-11 NOTE — Procedures (Signed)
  History:  Patrick Brown is a 42 year old gentleman with a history of diabetes and an HIV infection. The patient has sustained a generalized seizure associated with a hypoglycemic event. The patient is being evaluated for the seizure. MRI evaluation the brain was unremarkable.  EEG classification: Normal awake  Description of the recording: The background rhythms of this recording consists of a fairly well modulated low amplitude alpha rhythm of 9 Hz that is reactive to eye opening and closure. As the record progresses, the patient appears to remain in the waking state throughout the recording. Photic stimulation was performed, resulting in a bilateral and symmetric photic driving response. Hyperventilation was also performed, resulting in a minimal buildup of the background rhythm activities without significant slowing seen. At no time during the recording does there appear to be evidence of spike or spike wave discharges or evidence of focal slowing. EKG monitor shows no evidence of cardiac rhythm abnormalities with a heart rate of 78.  Impression: This is a normal EEG recording in the waking state. No evidence of ictal or interictal discharges are seen.

## 2012-06-11 NOTE — Progress Notes (Signed)
Reason for visit: Seizure  Patrick Brown is a 42 y.o. male  History of present illness:  Patrick Brown is a 42 year old right-handed white male with a history of diabetes and an HIV infection. The patient has had a recent seizure approximately one month ago that was associated with a significant hypoglycemic event. The patient had not eaten breakfast that day, and he began to feel unusual, as if he were outside of his body. The patient tried to drink lemonade to keep his blood sugars up, but he eventually he suffered a prolonged generalized seizure event unassociated with tongue biting or bowel or bladder incontinence. The patient was noted to have a blood sugar of 64 by EMS during an active seizure, and he was given D50, which did not bring his blood sugars up. The patient underwent MRI evaluation of the brain that was unremarkable. The patient did not have an EEG study. The patient works for EMS, and part of his job entails driving an ambulance. The patient had a seizure 10 years ago associated with a blood sugar of 9. The patient has had episodes of hypoglycemia, and he will occasionally become agitated, confused, and require glucagon injections. The patient has an insulin pump. The patient is sent to this office for an evaluation.  Past Medical History  Diagnosis Date  . IDDM (insulin dependent diabetes mellitus)     38 years  . Retinopathy     x2  . Proteinuria   . Dyslipidemia   . Anemia, iron deficiency On procrit  . Hematuria, microscopic 10/09    work up negative (Dr. Amalia Hailey)  . Hypothyroidism   . CKD (chronic kidney disease)   . Low HDL (under 40)   . 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 Hyperkalemia   . Depression   . Gastroparesis diabeticorum   .  Gastroesophageal reflux disease   . Degenerative arthritis     Past Surgical History  Procedure Laterality Date  . Eye surgery  [phone]    x2   . Vitrectomy  bilateral  . Insulin pump    . Colonoscopy    . Esophagogastroduodenoscopy    . Lasik Bilateral   . Cataract extraction Right     Family History  Problem Relation Age of Onset  . Diabetes type I Brother   . Prostate cancer Other   . Dementia Other   . Dementia Other   . Dementia Other     Social history:  reports that he has never smoked. He has never used smokeless tobacco. He reports that he does not drink alcohol or use illicit drugs.  Medications:  Current Outpatient Prescriptions on File Prior to Visit  Medication Sig Dispense Refill  . acetaminophen (TYLENOL) 500 MG tablet Take 1,000 mg by mouth every 6 (six) hours as needed (pain).       Marland Kitchen aspirin 81 MG chewable tablet Chew 81 mg by mouth every morning.      Marland Kitchen epoetin alfa (EPOGEN,PROCRIT) 16109 UNIT/ML injection Inject 20,000 Units into the skin every 30 (thirty) days.      Marland Kitchen esomeprazole (NEXIUM) 40 MG capsule Take 40 mg by mouth daily before breakfast.      . ferrous sulfate 325 (65 FE) MG tablet Take 325 mg by mouth 2 (two) times daily.      Marland Kitchen  furosemide (LASIX) 20 MG tablet Take 20 mg by mouth 2 (two) times daily.      . Glucosamine-Chondroit-Vit C-Mn (GLUCOSAMINE 1500 COMPLEX PO) Take 1 tablet by mouth 2 (two) times daily.       . hydrOXYzine (ATARAX/VISTARIL) 25 MG tablet Take 25 mg by mouth 2 (two) times daily.      . insulin lispro (HUMALOG) 100 UNIT/ML injection Inject into the skin continuous. Pt.  Uses insulin via insulin pump .      . levothyroxine (SYNTHROID, LEVOTHROID) 175 MCG tablet Take 1 tablet (175 mcg total) by mouth every morning.  30 tablet  9  . losartan (COZAAR) 25 MG tablet Take 25 mg by mouth every morning.      . metoCLOPramide (REGLAN) 5 MG tablet Take 1 tablet (5 mg total) by mouth 3 (three) times daily before meals.  90 tablet  5   . omega-3 acid ethyl esters (LOVAZA) 1 G capsule Take 2 g by mouth 2 (two) times daily.        . simvastatin (ZOCOR) 40 MG tablet Take 40 mg by mouth at bedtime.        . sodium bicarbonate 650 MG tablet Take 650 mg by mouth every morning.       . sodium polystyrene (KAYEXALATE) 15 GM/60ML suspension Take 30 g by mouth 3 (three) times a week. Tues, thur, and saturdays      . testosterone (ANDROGEL) 50 MG/5GM GEL Place 5 g onto the skin daily.       Marland Kitchen venlafaxine XR (EFFEXOR XR) 37.5 MG 24 hr capsule Take 3 capsules (112.5 mg total) by mouth daily.  90 capsule  1   No current facility-administered medications on file prior to visit.    Allergies:  Allergies  Allergen Reactions  . Sulfa Antibiotics Other (See Comments)    High potassium  . Ramipril Cough  . Versed (Midazolam)     ROS:  Out of a complete 14 system review of symptoms, the patient complains only of the following symptoms, and all other reviewed systems are negative.  Skin rash Anemia Runny nose Seizure Depression Shift work  Blood pressure 112/65, pulse 85, height 5\' 10"  (1.778 m), weight 186 lb (84.369 kg).  Physical Exam  General: The patient is alert and cooperative at the time of the examination.  Head: Pupils are equal, round, and reactive to light. Discs are flat bilaterally. The cataract is present on the left.  Neck: The neck is supple, no carotid bruits are noted.  Respiratory: The respiratory examination is clear.  Cardiovascular: The cardiovascular examination reveals a regular rate and rhythm, no obvious murmurs or rubs are noted.  Skin: Extremities are without significant edema.  Neurologic Exam  Mental status:  Cranial nerves: Facial symmetry is present. There is good sensation of the face to pinprick and soft touch bilaterally. The strength of the facial muscles and the muscles to head turning and shoulder shrug are normal bilaterally. Speech is well enunciated, no aphasia or dysarthria  is noted. Extraocular movements are full. Visual fields are full.  Motor: The motor testing reveals 5 over 5 strength of all 4 extremities. Good symmetric motor tone is noted throughout.  Sensory: Sensory testing is intact to pinprick, soft touch, vibration sensation, and position sense on all 4 extremities. No evidence of extinction is noted.  Coordination: Cerebellar testing reveals good finger-nose-finger and heel-to-shin bilaterally.  Gait and station: Gait is normal. Tandem gait is normal. Romberg is negative. No drift is  seen.  Reflexes: Deep tendon reflexes are symmetric and normal bilaterally, with the exception that the knee jerk reflexes are somewhat brisk. Toes are downgoing bilaterally.   Assessment/Plan:  1. Seizure, associated with hypoglycemia  2. Diabetes  3. HIV infection  The patient has suffered a generalized seizure event associated with a significant hypoglycemic event. The patient will undergo EEG evaluation. MRI of the brain done previously was unremarkable. I have recommended that he not drive for 6 months following the seizure. The patient is concerned about his job. The patient may work if he can get to work without driving, and if his employer will allow him to work without having to drive. The patient will otherwise followup in 5 months.  Jill Alexanders MD 06/11/2012 8:08 PM  [facility] [address] [place], [place] [address]  Phone [phone] Fax [phone]

## 2012-06-11 NOTE — Telephone Encounter (Signed)
I called patient. The EEG study was unremarkable.

## 2012-06-18 ENCOUNTER — Ambulatory Visit (INDEPENDENT_AMBULATORY_CARE_PROVIDER_SITE_OTHER): Payer: BC Managed Care – PPO | Admitting: Internal Medicine

## 2012-06-18 ENCOUNTER — Encounter: Payer: Self-pay | Admitting: Internal Medicine

## 2012-06-18 VITALS — BP 123/80 | HR 90 | Temp 98.4°F | Ht 70.0 in | Wt 189.0 lb

## 2012-06-18 DIAGNOSIS — Z113 Encounter for screening for infections with a predominantly sexual mode of transmission: Secondary | ICD-10-CM

## 2012-06-18 DIAGNOSIS — B20 Human immunodeficiency virus [HIV] disease: Secondary | ICD-10-CM

## 2012-06-18 DIAGNOSIS — Z79899 Other long term (current) drug therapy: Secondary | ICD-10-CM

## 2012-06-18 DIAGNOSIS — Z23 Encounter for immunization: Secondary | ICD-10-CM

## 2012-06-18 LAB — HEPATIC FUNCTION PANEL
Albumin: 4 g/dL (ref 3.5–5.2)
Alkaline Phosphatase: 99 U/L (ref 39–117)
Total Protein: 6.4 g/dL (ref 6.0–8.3)

## 2012-06-18 NOTE — Assessment & Plan Note (Signed)
He is confirmed positive and this was discussed with him. All questions answered. I did discuss different medications options and likely will not be able to take one pill a day 2 to his underlying kidney disease.  I am going to check his CD4 count and other appropriate labs including HLA B. 5701 to see if he is able to take abacavir-based regimen. His current GFR is about 50 and therefore he can at this time take full dose Epzicom and I may give this with tivicay in anticipation of a one pill a day option in the future. This however will be dependent on his kidney function being stable. He will return in 2 weeks for follow up of his labs. He did get his Pneumovax today. In 2 weeks I likely will start him on the above regimen.

## 2012-06-18 NOTE — Progress Notes (Signed)
  Subjective:    Patient ID: Patrick Brown, male    DOB: 12/31/1970, 42 y.o.   MRN: [id number]  HPI Is in for followup of his first visit. He was seen about 2 weeks ago after he had a screening Elisa positive for HIV.  This was subsequently confirmed with a Western blot and a viral load and he has a genotype which was wild type no mutations. He does endorse MSM activity. He otherwise has questions about medications and side effects and frequency of visits but otherwise is accepting of his diagnosis. He has no particular concerns with starting medicines and his willing and ready to start his medications.   Review of Systems  Constitutional: Negative for fever, fatigue and unexpected weight change.  HENT: Negative for sore throat and trouble swallowing.   Respiratory: Negative for shortness of breath.   Cardiovascular: Negative for chest pain and leg swelling.  Gastrointestinal: Negative for nausea, abdominal pain and diarrhea.  Musculoskeletal: Negative for myalgias, joint swelling and arthralgias.  Skin: Negative for rash.  Neurological: Negative for dizziness and headaches.  Hematological: Negative for adenopathy.  Psychiatric/Behavioral: Negative for dysphoric mood. The patient is not nervous/anxious.        Objective:   Physical Exam  Constitutional: He appears well-developed and well-nourished. No distress.  Cardiovascular: Normal rate, regular rhythm and normal heart sounds.  Exam reveals no gallop and no friction rub.   No murmur heard. Pulmonary/Chest: Effort normal and breath sounds normal. No respiratory distress. He has no wheezes. He has no rales.          Assessment & Plan:

## 2012-06-19 ENCOUNTER — Ambulatory Visit: Payer: BC Managed Care – PPO | Admitting: Neurology

## 2012-06-19 LAB — HEPATITIS B SURFACE ANTIGEN: Hepatitis B Surface Ag: NEGATIVE

## 2012-06-19 LAB — HEPATITIS B CORE ANTIBODY, TOTAL: Hep B Core Total Ab: NEGATIVE

## 2012-06-19 LAB — HEPATITIS B SURFACE ANTIBODY,QUALITATIVE: Hep B S Ab: NONREACTIVE

## 2012-06-19 LAB — T-HELPER CELL (CD4) - (RCID CLINIC ONLY)
CD4 % Helper T Cell: 2 % — ABNORMAL LOW (ref 33–55)
CD4 T Cell Abs: 20 uL — ABNORMAL LOW (ref 400–2700)

## 2012-06-19 LAB — HEPATITIS C ANTIBODY: HCV Ab: NEGATIVE

## 2012-06-19 LAB — RPR TITER: RPR Titer: 1:2 {titer}

## 2012-06-19 LAB — RPR: RPR Ser Ql: REACTIVE — AB

## 2012-06-22 LAB — QUANTIFERON TB GOLD ASSAY (BLOOD)
Mitogen value: 6.06 IU/mL
Quantiferon Tb Ag Minus Nil Value: 0 IU/mL

## 2012-06-25 ENCOUNTER — Ambulatory Visit (INDEPENDENT_AMBULATORY_CARE_PROVIDER_SITE_OTHER): Payer: BC Managed Care – PPO | Admitting: Nurse Practitioner

## 2012-06-25 VITALS — BP 109/70 | HR 87 | Temp 98.0°F

## 2012-06-25 DIAGNOSIS — L0291 Cutaneous abscess, unspecified: Secondary | ICD-10-CM

## 2012-06-25 DIAGNOSIS — L0390 Cellulitis, unspecified: Secondary | ICD-10-CM

## 2012-06-25 MED ORDER — CIPROFLOXACIN HCL 500 MG PO TABS: 500.0000 mg | ORAL_TABLET | Freq: Two times a day (BID) | ORAL | Status: DC

## 2012-06-25 NOTE — Progress Notes (Signed)
LMOM

## 2012-06-25 NOTE — Progress Notes (Signed)
  Subjective:    Patient ID: Patrick Brown, male    DOB: 07/22/1970, 42 y.o.   MRN: [id number]  HPI Patient is an insulin deendent diabetic and wears a pump. Has an infection at a previous pump site- Has been there for almost 6 days- Hurts to touch- red and hard- No drainage.    Review of Systems  Constitutional: Negative for fever and chills.  Respiratory: Negative.   Cardiovascular: Negative.   Gastrointestinal: Negative.   Skin: Negative.        Objective:   Physical Exam  Constitutional: He appears well-developed and well-nourished.  Cardiovascular: Normal rate and normal heart sounds.   Pulmonary/Chest: Effort normal and breath sounds normal.  Skin:  4cm erythematous indurated area left flank     BP 109/70  Pulse 87  Temp(Src) 98 F (36.7 C) (Oral)      Assessment & Plan:  1. Cellulitis and abscess Keep clean and dry Warm compresses If comes to head ok to squeeze - ciprofloxacin (CIPRO) 500 MG tablet; Take 1 tablet (500 mg total) by mouth 2 (two) times daily.  Dispense: 20 tablet; Refill: 0  Mary-Margaret Hassell Done, FNP

## 2012-06-25 NOTE — Patient Instructions (Signed)
Cellulitis Cellulitis is an infection of the skin and the tissue beneath it. The infected area is usually red and tender. Cellulitis occurs most often in the arms and lower legs.  CAUSES  Cellulitis is caused by bacteria that enter the skin through cracks or cuts in the skin. The most common types of bacteria that cause cellulitis are Staphylococcus and Streptococcus. SYMPTOMS   Redness and warmth.  Swelling.  Tenderness or pain.  Fever. DIAGNOSIS  Your caregiver can usually determine what is wrong based on a physical exam. Blood tests may also be done. TREATMENT  Treatment usually involves taking an antibiotic medicine. HOME CARE INSTRUCTIONS   Take your antibiotics as directed. Finish them even if you start to feel better.  Keep the infected arm or leg elevated to reduce swelling.  Apply a warm cloth to the affected area up to 4 times per day to relieve pain.  Only take over-the-counter or prescription medicines for pain, discomfort, or fever as directed by your caregiver.  Keep all follow-up appointments as directed by your caregiver. SEEK MEDICAL CARE IF:   You notice red streaks coming from the infected area.  Your red area gets larger or turns dark in color.  Your bone or joint underneath the infected area becomes painful after the skin has healed.  Your infection returns in the same area or another area.  You notice a swollen bump in the infected area.  You develop new symptoms. SEEK IMMEDIATE MEDICAL CARE IF:   You have a fever.  You feel very sleepy.  You develop vomiting or diarrhea.  You have a general ill feeling (malaise) with muscle aches and pains. MAKE SURE YOU:   Understand these instructions.  Will watch your condition.  Will get help right away if you are not doing well or get worse. Document Released: 10/31/2004 Document Revised: 07/23/2011 Document Reviewed: 04/08/2011 [employer] Patient Information 2014 [employer].  

## 2012-06-29 ENCOUNTER — Emergency Department (HOSPITAL_COMMUNITY)
Admission: EM | Admit: 2012-06-29 | Discharge: 2012-06-29 | Disposition: A | Discharge status: Home / Self Care | Payer: BC Managed Care – PPO | Attending: Emergency Medicine | Admitting: Emergency Medicine

## 2012-06-29 ENCOUNTER — Encounter (HOSPITAL_COMMUNITY): Payer: Self-pay | Admitting: Emergency Medicine

## 2012-06-29 ENCOUNTER — Emergency Department (HOSPITAL_COMMUNITY): Payer: BC Managed Care – PPO

## 2012-06-29 DIAGNOSIS — Z8639 Personal history of other endocrine, nutritional and metabolic disease: Secondary | ICD-10-CM | POA: Insufficient documentation

## 2012-06-29 DIAGNOSIS — E785 Hyperlipidemia, unspecified: Secondary | ICD-10-CM | POA: Insufficient documentation

## 2012-06-29 DIAGNOSIS — Z7982 Long term (current) use of aspirin: Secondary | ICD-10-CM | POA: Insufficient documentation

## 2012-06-29 DIAGNOSIS — Z79899 Other long term (current) drug therapy: Secondary | ICD-10-CM | POA: Insufficient documentation

## 2012-06-29 DIAGNOSIS — N189 Chronic kidney disease, unspecified: Secondary | ICD-10-CM | POA: Insufficient documentation

## 2012-06-29 DIAGNOSIS — M1990 Unspecified osteoarthritis, unspecified site: Secondary | ICD-10-CM | POA: Insufficient documentation

## 2012-06-29 DIAGNOSIS — E039 Hypothyroidism, unspecified: Secondary | ICD-10-CM | POA: Insufficient documentation

## 2012-06-29 DIAGNOSIS — Z21 Asymptomatic human immunodeficiency virus [HIV] infection status: Secondary | ICD-10-CM | POA: Insufficient documentation

## 2012-06-29 DIAGNOSIS — K219 Gastro-esophageal reflux disease without esophagitis: Secondary | ICD-10-CM | POA: Insufficient documentation

## 2012-06-29 DIAGNOSIS — R5383 Other fatigue: Secondary | ICD-10-CM | POA: Insufficient documentation

## 2012-06-29 DIAGNOSIS — I129 Hypertensive chronic kidney disease with stage 1 through stage 4 chronic kidney disease, or unspecified chronic kidney disease: Secondary | ICD-10-CM | POA: Insufficient documentation

## 2012-06-29 DIAGNOSIS — Z794 Long term (current) use of insulin: Secondary | ICD-10-CM | POA: Insufficient documentation

## 2012-06-29 DIAGNOSIS — K3184 Gastroparesis: Secondary | ICD-10-CM | POA: Insufficient documentation

## 2012-06-29 DIAGNOSIS — Z8669 Personal history of other diseases of the nervous system and sense organs: Secondary | ICD-10-CM | POA: Insufficient documentation

## 2012-06-29 DIAGNOSIS — F3289 Other specified depressive episodes: Secondary | ICD-10-CM | POA: Insufficient documentation

## 2012-06-29 DIAGNOSIS — Z862 Personal history of diseases of the blood and blood-forming organs and certain disorders involving the immune mechanism: Secondary | ICD-10-CM | POA: Insufficient documentation

## 2012-06-29 DIAGNOSIS — Z87448 Personal history of other diseases of urinary system: Secondary | ICD-10-CM | POA: Insufficient documentation

## 2012-06-29 DIAGNOSIS — R5381 Other malaise: Secondary | ICD-10-CM | POA: Insufficient documentation

## 2012-06-29 DIAGNOSIS — J189 Pneumonia, unspecified organism: Secondary | ICD-10-CM

## 2012-06-29 DIAGNOSIS — F329 Major depressive disorder, single episode, unspecified: Secondary | ICD-10-CM | POA: Insufficient documentation

## 2012-06-29 DIAGNOSIS — E1149 Type 2 diabetes mellitus with other diabetic neurological complication: Secondary | ICD-10-CM | POA: Insufficient documentation

## 2012-06-29 DIAGNOSIS — D509 Iron deficiency anemia, unspecified: Secondary | ICD-10-CM | POA: Insufficient documentation

## 2012-06-29 LAB — POCT I-STAT TROPONIN I: Troponin i, poc: 0 ng/mL (ref 0.00–0.08)

## 2012-06-29 LAB — CBC WITH DIFFERENTIAL/PLATELET
Basophils Absolute: 0 10*3/uL (ref 0.0–0.1)
Basophils Relative: 0 % (ref 0–1)
Eosinophils Relative: 6 % — ABNORMAL HIGH (ref 0–5)
HCT: 31.1 % — ABNORMAL LOW (ref 39.0–52.0)
MCHC: 34.1 g/dL (ref 30.0–36.0)
MCV: 85.7 fL (ref 78.0–100.0)
Monocytes Absolute: 1 10*3/uL (ref 0.1–1.0)
RDW: 12.7 % (ref 11.5–15.5)

## 2012-06-29 LAB — BASIC METABOLIC PANEL
BUN: 39 mg/dL — ABNORMAL HIGH (ref 6–23)
CO2: 26 mEq/L (ref 19–32)
Chloride: 96 mEq/L (ref 96–112)
Creatinine, Ser: 1.66 mg/dL — ABNORMAL HIGH (ref 0.50–1.35)
GFR calc Af Amer: 57 mL/min — ABNORMAL LOW (ref >90)

## 2012-06-29 MED ORDER — AMOXICILLIN-POT CLAVULANATE 875-125 MG PO TABS: 1.0000 | ORAL_TABLET | Freq: Two times a day (BID) | ORAL | Status: DC

## 2012-06-29 NOTE — ED Notes (Signed)
Pt states he woke up this morning feeling weak and after exertion feels SOB. Pt states he feels as if his potassium is high. 18 right AC.

## 2012-06-29 NOTE — ED Notes (Signed)
Ptr states that he takes Kayexalate 3 times a week and last night he took it but vomited it back up. He states that al these sx usually happens when his potassium becomes elevated. Pt states that his insulin pump site become infected last week and he was placed on Cipro for that.

## 2012-06-29 NOTE — ED Notes (Signed)
Bed:WA22<BR> Expected date:<BR> Expected time:<BR> Means of arrival:<BR> Comments:<BR>

## 2012-06-29 NOTE — ED Provider Notes (Signed)
History     CSN: [id number]  Arrival date & time 06/29/12  0717   First MD Initiated Contact with Patient 06/29/12 [phone]      Chief Complaint  Patient presents with  . Shortness of Breath  . Weakness    (Consider location/radiation/quality/duration/timing/severity/associated sxs/prior treatment) HPI  Patient is a 42 year old male past medical history significant for IDDM, Hyperkalemia, CKD, HIV, hypothyriodism presenting to the emergency department for an episode of exertional shortness of breath and weakness occurred this morning while the patient was getting ready for work. Patient states that he typically has episodes of shortness of breath and weakness when he has an issue with his potassium levels. Patient states he is on Kayexalate 3 times a week and Lasix 40 mg daily or as hyperkalemia, states he is unable to keep his Kayexalate down last evening. Patient had a recent infection or his insulin pump was positioned, seen by his PCP and being treated currently on Cipro. States wound is healing well. Patient denies any current shortness of breath, weakness. Also denies chest pain, headache, numbness or tingling, abdominal pain.  Past Medical History  Diagnosis Date  . IDDM (insulin dependent diabetes mellitus)     38 years  . Retinopathy     x2  . Proteinuria   . Dyslipidemia   . Anemia, iron deficiency On procrit  . Hematuria, microscopic 10/09    work up negative (Dr. Amalia Hailey)  . Hypothyroidism   . CKD (chronic kidney disease)   . Low HDL (under 40)   . 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 Hyperkalemia   . Depression   . Gastroparesis diabeticorum   . Gastroesophageal reflux disease   . Degenerative arthritis     Past Surgical History  Procedure  Laterality Date  . Eye surgery  [phone]    x2   . Vitrectomy  bilateral  . Insulin pump    . Colonoscopy    . Esophagogastroduodenoscopy    . Lasik Bilateral   . Cataract extraction Right     Family History  Problem Relation Age of Onset  . Diabetes type I Brother   . Prostate cancer Other   . Dementia Other   . Dementia Other   . Dementia Other     History  Substance Use Topics  . Smoking status: Never Smoker   . Smokeless tobacco: Never Used  . Alcohol Use: No     Comment: occas , one monthly      Review of Systems  Constitutional: Negative for fever and chills.  HENT: Negative for neck pain.   Eyes: Negative for visual disturbance.  Respiratory: Positive for shortness of breath. Negative for cough and chest tightness.   Cardiovascular: Negative for chest pain, palpitations and leg swelling.  Gastrointestinal: Negative for nausea, vomiting and abdominal pain.  Genitourinary: Negative.   Musculoskeletal: Negative for back pain.  Skin: Negative for rash.  Neurological: Negative for headaches.    Allergies  Sulfa antibiotics; Ramipril; and Versed  Home Medications   Current Outpatient Rx  Name  Route  Sig  Dispense  Refill  . acetaminophen (TYLENOL) 500 MG tablet   Oral   Take 1,000 mg by mouth 2 (two) times daily.          Marland Kitchen aspirin 81 MG chewable tablet  Oral   Chew 81 mg by mouth every morning.         . ciprofloxacin (CIPRO) 500 MG tablet   Oral   Take 500 mg by mouth 2 (two) times daily. He is taking for 10 days. He started on 06/25/12 and has not completed.         Marland Kitchen epoetin alfa (EPOGEN,PROCRIT) 16109 UNIT/ML injection   Subcutaneous   Inject 20,000 Units into the skin every 30 (thirty) days.         Marland Kitchen esomeprazole (NEXIUM) 40 MG capsule   Oral   Take 40 mg by mouth daily before breakfast.         . ferrous sulfate 325 (65 FE) MG tablet   Oral   Take 325 mg by mouth 2 (two) times daily.         . furosemide (LASIX) 20 MG  tablet   Oral   Take 20 mg by mouth 2 (two) times daily.         . Glucosamine-Chondroit-Vit C-Mn (GLUCOSAMINE 1500 COMPLEX PO)   Oral   Take 1 tablet by mouth 2 (two) times daily.          . hydrOXYzine (ATARAX/VISTARIL) 25 MG tablet   Oral   Take 25 mg by mouth 2 (two) times daily.         . Insulin Human (INSULIN PUMP) 100 unit/ml SOLN   Subcutaneous   Inject into the skin continuous. He uses Humalog Insulin in pump.         Marland Kitchen levothyroxine (SYNTHROID, LEVOTHROID) 175 MCG tablet   Oral   Take 1 tablet (175 mcg total) by mouth every morning.   30 tablet   9   . losartan (COZAAR) 25 MG tablet   Oral   Take 25 mg by mouth every morning.         . metoCLOPramide (REGLAN) 5 MG tablet   Oral   Take 1 tablet (5 mg total) by mouth 3 (three) times daily before meals.   90 tablet   5   . omega-3 acid ethyl esters (LOVAZA) 1 G capsule   Oral   Take 2 g by mouth 2 (two) times daily.           . simvastatin (ZOCOR) 40 MG tablet   Oral   Take 40 mg by mouth at bedtime.           . sodium bicarbonate 650 MG tablet   Oral   Take 650 mg by mouth every morning.          . sodium polystyrene (KAYEXALATE) 15 GM/60ML suspension   Oral   Take 30 g by mouth 3 (three) times a week. He takes on Tuesday, Thursday and Saturday.         . testosterone (ANDROGEL) 50 MG/5GM GEL   Transdermal   Place 5 g onto the skin every morning.          . traMADol (ULTRAM) 50 MG tablet   Oral   Take 50 mg by mouth every 6 (six) hours as needed for pain.         Marland Kitchen venlafaxine XR (EFFEXOR-XR) 37.5 MG 24 hr capsule   Oral   Take 112.5 mg by mouth every morning.          Marland Kitchen amoxicillin-clavulanate (AUGMENTIN) 875-125 MG per tablet   Oral   Take 1 tablet by mouth every 12 (twelve) hours.   14 tablet   0  BP 131/67  Pulse 99  Temp(Src) 98.9 F (37.2 C) (Oral)  Resp 18  SpO2 100%  Physical Exam  Constitutional: He is oriented to person, place, and time. He  appears well-developed and well-nourished.  HENT:  Head: Normocephalic and atraumatic.  Mouth/Throat: Oropharynx is clear and moist.  Eyes: Conjunctivae and EOM are normal. Pupils are equal, round, and reactive to light.  Neck: Neck supple.  Cardiovascular: Normal rate, regular rhythm and normal heart sounds.   Pulmonary/Chest: Effort normal and breath sounds normal. No accessory muscle usage. No respiratory distress. He exhibits no tenderness.  Abdominal: Soft. Bowel sounds are normal. There is no tenderness.  Neurological: He is alert and oriented to person, place, and time. No cranial nerve deficit. Coordination normal.  Skin: Skin is warm and dry.  Psychiatric: He has a normal mood and affect.    ED Course  Procedures (including critical care time)  Labs Reviewed  BASIC METABOLIC PANEL - Abnormal; Notable for the following:    Sodium 134 (*)    Glucose, Bld 287 (*)    BUN 39 (*)    Creatinine, Ser 1.66 (*)    GFR calc non Af Amer 49 (*)    GFR calc Af Amer 57 (*)    All other components within normal limits  CBC WITH DIFFERENTIAL - Abnormal; Notable for the following:    RBC 3.63 (*)    Hemoglobin 10.6 (*)    HCT 31.1 (*)    Eosinophils Relative 6 (*)    All other components within normal limits  POCT I-STAT TROPONIN I    Date: 06/29/2012  Rate: 87  Rhythm: normal sinus rhythm  QRS Axis: normal  Intervals: normal  ST/T Wave abnormalities: normal  Conduction Disutrbances:none  Narrative Interpretation:   Old EKG Reviewed: unchanged    Dg Chest 2 View  06/29/2012   *RADIOLOGY REPORT*  Clinical Data: Shortness of breath.  Current history of diabetes, hypertension and chronic kidney disease.  CHEST - 2 VIEW  Comparison: Two-view chest x-ray 05/21/2012, 05/01/2012 [facility] and 04/18/2012 [facility].  Findings: Cardiomediastinal silhouette unremarkable, unchanged. Streaky opacities in the lingula.  Lungs otherwise clear. Bronchovascular  markings normal.  Pulmonary vascularity normal.  No pneumothorax.  No pleural effusions. Visualized bony thorax intact.  IMPRESSION: Streaky atelectasis or bronchopneumonia in the lingula.   Original Report Authenticated By: Evangeline Dakin, M.D.     1. Community acquired pneumonia       MDM  Patient presented for a short-lived episode of shortness of breath and weakness and concern for hyperkalemia. Physical exam findings were benign. Vital signs stable. Labs, troponin, EKG are reviewed and within normal limits. Patient is not currently hypokalemic. No concerning for ACS. Chest x-ray noted for possible lingular pneumonia. Patient currently asymptomatic with no complaint of shortness of breath. Pt is not ill appearing and is reliable, therefore I feel like the they can be treated as an OP with abx therapy. Pt has been advised to return to the ED if symptoms worsen or they do not improve. Pt verbalizes understanding and is agreeable with plan. Advised to follow up with PCP in 1-2 days. Patient d/w with Dr. Tamera Punt, agrees with plan. Patient is stable at time of discharge           Harlow Mares, PA-C 06/29/12 [id number]

## 2012-06-30 ENCOUNTER — Encounter: Payer: Self-pay | Admitting: *Deleted

## 2012-06-30 ENCOUNTER — Telehealth: Payer: Self-pay | Admitting: *Deleted

## 2012-06-30 DIAGNOSIS — J189 Pneumonia, unspecified organism: Secondary | ICD-10-CM

## 2012-06-30 NOTE — Telephone Encounter (Signed)
Message copied by Marin Olp on Tue Jun 30, 2012  5:18 PM ------      Message from: Chipper Herb      Created: Thu Jun 11, 2012  1:47 PM       TSH is low. He is taking 0.175 daily of Synthroid reduced this and take one .175 every day except on Sunday take one half, recheck thyroid profile and 6 weeks      Vitamin D is good at 67      Electrolytes potassium are good. Blood sugars 196. Creatinine is 1.68 which is stable compared to 3 weeks ago.       1 liver function tests are slightly elevated. All others are within normal limits      Advanced lipid panel has a total LDL particle member that is 1125, this is slightly elevated, triglycerides are increased at 182, HDL particle number is low--continue aggressive therapeutic lifestyle changes and current treat       ------

## 2012-07-01 ENCOUNTER — Encounter (HOSPITAL_COMMUNITY): Payer: BC Managed Care – PPO

## 2012-07-02 ENCOUNTER — Telehealth: Payer: Self-pay | Admitting: Family Medicine

## 2012-07-06 ENCOUNTER — Telehealth: Payer: Self-pay | Admitting: *Deleted

## 2012-07-06 ENCOUNTER — Ambulatory Visit: Payer: BC Managed Care – PPO | Admitting: Internal Medicine

## 2012-07-06 NOTE — Telephone Encounter (Signed)
Left message for pt to call and reschedule his doctor's appointment.  Pt may be overbooked - needs to start dapsone ASAP per Dr Linus Salmons. Landis Gandy, RN

## 2012-07-06 NOTE — ED Provider Notes (Signed)
Medical screening examination/treatment/procedure(s) were conducted as a shared visit with non-physician practitioner(s) and myself.  I personally evaluated the patient during the encounter   Malvin Johns, MD 07/06/12 2338

## 2012-07-07 ENCOUNTER — Other Ambulatory Visit: Payer: Self-pay

## 2012-07-07 ENCOUNTER — Telehealth: Payer: Self-pay

## 2012-07-07 DIAGNOSIS — J189 Pneumonia, unspecified organism: Secondary | ICD-10-CM

## 2012-07-07 MED ORDER — INSULIN ASPART 100 UNIT/ML ~~LOC~~ SOLN: 60.0000 [IU] | Freq: Three times a day (TID) | SUBCUTANEOUS | Status: DC

## 2012-07-07 NOTE — Telephone Encounter (Signed)
Need you to order a spirometer for him due to recurrent pneumonia

## 2012-07-07 NOTE — Telephone Encounter (Signed)
Can't tell patients dose by chart

## 2012-07-07 NOTE — Telephone Encounter (Signed)
Please order the spirometer 4 Fredis due to recurrent bouts of pneumonia.

## 2012-07-08 ENCOUNTER — Ambulatory Visit (INDEPENDENT_AMBULATORY_CARE_PROVIDER_SITE_OTHER): Payer: BC Managed Care – PPO | Admitting: Family Medicine

## 2012-07-08 ENCOUNTER — Other Ambulatory Visit: Payer: Self-pay | Admitting: *Deleted

## 2012-07-08 ENCOUNTER — Ambulatory Visit (INDEPENDENT_AMBULATORY_CARE_PROVIDER_SITE_OTHER): Payer: BC Managed Care – PPO

## 2012-07-08 ENCOUNTER — Other Ambulatory Visit: Payer: Self-pay

## 2012-07-08 ENCOUNTER — Encounter: Payer: Self-pay | Admitting: Family Medicine

## 2012-07-08 VITALS — BP 108/61 | HR 80 | Temp 97.9°F | Ht 70.0 in | Wt 176.4 lb

## 2012-07-08 DIAGNOSIS — R509 Fever, unspecified: Secondary | ICD-10-CM

## 2012-07-08 DIAGNOSIS — IMO0002 Reserved for concepts with insufficient information to code with codable children: Secondary | ICD-10-CM

## 2012-07-08 DIAGNOSIS — B20 Human immunodeficiency virus [HIV] disease: Secondary | ICD-10-CM

## 2012-07-08 DIAGNOSIS — J189 Pneumonia, unspecified organism: Secondary | ICD-10-CM

## 2012-07-08 DIAGNOSIS — N183 Chronic kidney disease, stage 3 unspecified: Secondary | ICD-10-CM

## 2012-07-08 DIAGNOSIS — E1065 Type 1 diabetes mellitus with hyperglycemia: Secondary | ICD-10-CM

## 2012-07-08 DIAGNOSIS — R5383 Other fatigue: Secondary | ICD-10-CM

## 2012-07-08 DIAGNOSIS — R5381 Other malaise: Secondary | ICD-10-CM

## 2012-07-08 LAB — POCT CBC
Lymph, poc: 1.8 (ref 0.6–3.4)
MCH, POC: 29.2 pg (ref 27–31.2)
MCHC: 33.7 g/dL (ref 31.8–35.4)
MCV: 86.6 fL (ref 80–97)
MPV: 6.8 fL (ref 0–99.8)
POC LYMPH PERCENT: 25.1 %L (ref 10–50)
Platelet Count, POC: 200 10*3/uL (ref 142–424)
RBC: 3.8 M/uL — AB (ref 4.69–6.13)
WBC: 7.1 10*3/uL (ref 4.6–10.2)

## 2012-07-08 LAB — BASIC METABOLIC PANEL WITH GFR
BUN: 42 mg/dL — ABNORMAL HIGH (ref 6–23)
CO2: 26 mEq/L (ref 19–32)
Calcium: 8.6 mg/dL (ref 8.4–10.5)
Chloride: 99 mEq/L (ref 96–112)
Creat: 1.84 mg/dL — ABNORMAL HIGH (ref 0.50–1.35)
GFR, Est Non African American: 44 mL/min — ABNORMAL LOW

## 2012-07-08 MED ORDER — INSULIN ASPART 100 UNIT/ML ~~LOC~~ SOLN: SUBCUTANEOUS | Status: DC

## 2012-07-08 MED ORDER — DAPSONE 100 MG PO TABS: 100.0000 mg | ORAL_TABLET | Freq: Every day | ORAL | Status: DC

## 2012-07-08 NOTE — Progress Notes (Signed)
  Subjective:    Patient ID: Patrick Brown, male    DOB: 1970/12/24, 42 y.o.   MRN: [id number]  HPI Patient returns to clinic today for followup from another bout of pneumonia seen initially in the emergency room. He is finishing his Augmentin currently. He is weak and  but doing better. Due to his current illness he was unable to do his followup exam with his infectious disease physician. He is going to call Dr.Comer today and reschedule that visit as soon as possible.   Review of Systems  Constitutional: Positive for fever (intermitent) and fatigue.  HENT: Positive for congestion.   Eyes: Negative.   Respiratory: Positive for cough (prod, white).   Cardiovascular: Negative.   Gastrointestinal: Negative.   Genitourinary: Negative.   Neurological: Positive for headaches (comes and goes). Negative for dizziness.       Objective:   Physical Exam BP 108/61  Pulse 80  Temp(Src) 97.9 F (36.6 C) (Oral)  Ht 5\' 10"  (1.778 m)  Wt 176 lb 6.4 oz (80.015 kg)  BMI 25.31 kg/m2  The patient appeared somewhat weak and fatigued,but alert and oriented to time and place. Speech, behavior and judgement appear normal. Vital signs as documented.  Head exam is unremarkable. No scleral icterus or pallor noted.  Neck is without jugular venous distension, thyromegally, or carotid bruits. Carotid upstrokes are brisk bilaterally. No cervical adenopathy. Lungs are clear anteriorly and posteriorly to auscultation. Normal respiratory effort. No rales or rhonchi. No axillary adenopathy. Cardiac exam reveals regular rate and rhythm at 72 per minute. First and second heart sounds normal.  No murmurs, rubs or gallops.  Abdominal exam reveals normal bowl sounds, no masses, no organomegaly and no aortic enlargement. No inguinal adenopathy. Extremities are nonedematous and both femoral  pulses are normal. Skin without pallor or jaundice.  Warm and dry, without rash. He does have some excoriated areas on the head  and neck and abdomen and legs.   [facility] reading (PRIMARY) by  Dr.Moore; within normal                                     Assessment & Plan:  1. Pneumonia - DG Chest 2 View; Future - POCT CBC  2. Fatigue - BASIC METABOLIC PANEL WITH GFR  3. CAP (community acquired pneumonia)  4. Chronic kidney disease, stage 3, mod decreased GFR  5. HIV disease  6. Fever, unspecified  7. DM (diabetes mellitus), type 1, uncontrolled  Patient Instructions  Followup with infectious disease and Dr. Lorrene Reid as soon as possible Complete current medication Use Mucinex regularly for cough and congestion

## 2012-07-08 NOTE — Patient Instructions (Addendum)
Followup with infectious disease and Dr. Lorrene Reid as soon as possible Complete current medication Use Mucinex regularly for cough and congestion

## 2012-07-09 NOTE — Addendum Note (Signed)
Addended by: Marin Olp on: 07/09/2012 05:50 PM   Modules accepted: Orders

## 2012-07-09 NOTE — Telephone Encounter (Signed)
Please do this

## 2012-07-13 ENCOUNTER — Ambulatory Visit (INDEPENDENT_AMBULATORY_CARE_PROVIDER_SITE_OTHER): Payer: BC Managed Care – PPO | Admitting: Internal Medicine

## 2012-07-13 ENCOUNTER — Encounter: Payer: Self-pay | Admitting: Internal Medicine

## 2012-07-13 VITALS — BP 101/69 | HR 110 | Temp 98.0°F | Wt 173.8 lb

## 2012-07-13 DIAGNOSIS — B20 Human immunodeficiency virus [HIV] disease: Secondary | ICD-10-CM

## 2012-07-13 DIAGNOSIS — R0602 Shortness of breath: Secondary | ICD-10-CM

## 2012-07-13 MED ORDER — ABACAVIR SULFATE-LAMIVUDINE 600-300 MG PO TABS: 1.0000 | ORAL_TABLET | Freq: Every day | ORAL | Status: DC

## 2012-07-13 MED ORDER — DOLUTEGRAVIR SODIUM 50 MG PO TABS: 50.0000 mg | ORAL_TABLET | Freq: Every day | ORAL | Status: DC

## 2012-07-14 ENCOUNTER — Encounter: Payer: Self-pay | Admitting: Internal Medicine

## 2012-07-14 ENCOUNTER — Telehealth: Payer: Self-pay | Admitting: Licensed Clinical Social Worker

## 2012-07-14 ENCOUNTER — Telehealth: Payer: Self-pay | Admitting: *Deleted

## 2012-07-14 DIAGNOSIS — R0602 Shortness of breath: Secondary | ICD-10-CM | POA: Insufficient documentation

## 2012-07-14 NOTE — Telephone Encounter (Signed)
Done 07-10-12 by Adelfa Koh

## 2012-07-14 NOTE — Telephone Encounter (Signed)
Message copied by Marin Olp on Tue Jul 14, 2012 12:00 PM ------      Message from: Chipper Herb      Created: Wed Jul 08, 2012  9:19 PM       The sodium was low, potassium was good at 5.3, blood sugar was very elevated at 363. BUN was 42 and creatinine was 1.84, previous was 1.66      Copy to nephrologist and infectious disease ------

## 2012-07-14 NOTE — Telephone Encounter (Signed)
Pt notified of results

## 2012-07-14 NOTE — Telephone Encounter (Signed)
He would need to be evaluated for oxygen by his primary with an O2 sat or an ABG.  I do not think though he would qualify.

## 2012-07-14 NOTE — Assessment & Plan Note (Signed)
I did discuss treatment options with him and with his renal insufficiency we'll start him on tivicay and Epzicom. His GFR is stable at this time. He is going to start this tomorrow and take daily. We'll then check his labs several weeks after starting.

## 2012-07-14 NOTE — Progress Notes (Signed)
  Subjective:    Patient ID: Patrick Brown, male    DOB: 22-Jan-1971, 42 y.o.   MRN: [id number]  HPI He comes in here for followup of his HIV. He was newly diagnosed recently on a screening exam in which I confirmed and he is in here now for consideration of treatment. His CD4 count is just 20 with a viral load over 200,000. He has multiple subjective complaints including periodic shortness of breath, cough with productive sputum, fatigue. No weight loss, no diarrhea. He does have renal insufficiency, diabetes. His most recent GFR is over 50. He was going to see a pulmonologist for "pneumonia". His chest x-ray did show some possible atelectasis versus bronchopneumonia but he is having no fever. He has been on antibiotics but sounds like was no improvement. No sick contacts. He is here with his mother who knows his diagnosis. He has no questions but his mother has questions regarding HIV treatment and this "pneumonia".   Review of Systems  Constitutional: Positive for fatigue. Negative for fever, chills and appetite change.  HENT: Negative for sore throat and trouble swallowing.   Respiratory: Positive for cough and shortness of breath. Negative for wheezing.   Cardiovascular: Negative for chest pain and leg swelling.  Gastrointestinal: Negative for nausea, abdominal pain and diarrhea.  Musculoskeletal: Negative for myalgias and arthralgias.  Skin: Negative for rash.  Neurological: Negative for dizziness and headaches.  Hematological: Negative for adenopathy.       Objective:   Physical Exam  Constitutional: He appears well-developed and well-nourished. No distress.  HENT:  Mouth/Throat: Oropharynx is clear and moist. No oropharyngeal exudate.  Cardiovascular: Normal rate, regular rhythm and normal heart sounds.   No murmur heard. Pulmonary/Chest: Effort normal and breath sounds normal. No respiratory distress. He has no wheezes.  Abdominal: Soft. Bowel sounds are normal. He exhibits no  distension. There is no tenderness.  Lymphadenopathy:    He has no cervical adenopathy.  Skin: Skin is warm and dry. No rash noted.          Assessment & Plan:

## 2012-07-14 NOTE — Assessment & Plan Note (Addendum)
He does have subjective shortness of breath however he does not appear to me by exam or in review of the x-rays to have active pneumonia. He may have had some viral pneumonia versus atelectasis but certainly no further need of antibiotics indicated. He did ask me if they should be going to a pulmonologist however I explained that he probably needs to get his HIV under control and see if much of his symptoms resolve with improving his immune system. He did call back after the appointment asking for oxygen at home but we have told him that he likely will not qualify but he may discuss with his primary physician to check his oxygen saturation to see if he is hypoxic for some reason.

## 2012-07-14 NOTE — Telephone Encounter (Signed)
Patient called wanting to know what did Dr. Linus Salmons decide to do about his exertional sob? He thinks he needs oxygen at home. Please advise

## 2012-07-15 ENCOUNTER — Other Ambulatory Visit: Payer: Self-pay | Admitting: *Deleted

## 2012-07-15 DIAGNOSIS — B20 Human immunodeficiency virus [HIV] disease: Secondary | ICD-10-CM

## 2012-07-15 MED ORDER — ABACAVIR SULFATE-LAMIVUDINE 600-300 MG PO TABS: 1.0000 | ORAL_TABLET | Freq: Every day | ORAL | Status: DC

## 2012-07-15 MED ORDER — DOLUTEGRAVIR SODIUM 50 MG PO TABS: 50.0000 mg | ORAL_TABLET | Freq: Every day | ORAL | Status: DC

## 2012-07-15 MED ORDER — DAPSONE 100 MG PO TABS: 100.0000 mg | ORAL_TABLET | Freq: Every day | ORAL | Status: DC

## 2012-07-17 ENCOUNTER — Encounter: Payer: Self-pay | Admitting: Pulmonary Disease

## 2012-07-22 ENCOUNTER — Institutional Professional Consult (permissible substitution): Payer: BC Managed Care – PPO | Admitting: Pulmonary Disease

## 2012-08-01 ENCOUNTER — Other Ambulatory Visit: Payer: Self-pay | Admitting: Family Medicine

## 2012-08-03 ENCOUNTER — Encounter: Payer: Self-pay | Admitting: Physician Assistant

## 2012-08-03 ENCOUNTER — Ambulatory Visit (INDEPENDENT_AMBULATORY_CARE_PROVIDER_SITE_OTHER): Payer: BC Managed Care – PPO | Admitting: Physician Assistant

## 2012-08-03 VITALS — BP 123/69 | HR 105 | Temp 99.2°F | Wt 176.2 lb

## 2012-08-03 DIAGNOSIS — T148XXA Other injury of unspecified body region, initial encounter: Secondary | ICD-10-CM

## 2012-08-03 LAB — BASIC METABOLIC PANEL
CO2: 24 mEq/L (ref 19–32)
Chloride: 99 mEq/L (ref 96–112)
Creat: 1.92 mg/dL — ABNORMAL HIGH (ref 0.50–1.35)
Potassium: 4.9 mEq/L (ref 3.5–5.3)

## 2012-08-03 MED ORDER — CEPHALEXIN 500 MG PO CAPS: 500.0000 mg | ORAL_CAPSULE | Freq: Four times a day (QID) | ORAL | Status: DC

## 2012-08-03 NOTE — Patient Instructions (Signed)
Cellulitis Cellulitis is an infection of the skin and the tissue beneath it. The infected area is usually red and tender. Cellulitis occurs most often in the arms and lower legs.  CAUSES  Cellulitis is caused by bacteria that enter the skin through cracks or cuts in the skin. The most common types of bacteria that cause cellulitis are Staphylococcus and Streptococcus. SYMPTOMS   Redness and warmth.  Swelling.  Tenderness or pain.  Fever. DIAGNOSIS  Your caregiver can usually determine what is wrong based on a physical exam. Blood tests may also be done. TREATMENT  Treatment usually involves taking an antibiotic medicine. HOME CARE INSTRUCTIONS   Take your antibiotics as directed. Finish them even if you start to feel better.  Keep the infected arm or leg elevated to reduce swelling.  Apply a warm cloth to the affected area up to 4 times per day to relieve pain.  Only take over-the-counter or prescription medicines for pain, discomfort, or fever as directed by your caregiver.  Keep all follow-up appointments as directed by your caregiver. SEEK MEDICAL CARE IF:   You notice red streaks coming from the infected area.  Your red area gets larger or turns dark in color.  Your bone or joint underneath the infected area becomes painful after the skin has healed.  Your infection returns in the same area or another area.  You notice a swollen bump in the infected area.  You develop new symptoms. SEEK IMMEDIATE MEDICAL CARE IF:   You have a fever.  You feel very sleepy.  You develop vomiting or diarrhea.  You have a general ill feeling (malaise) with muscle aches and pains. MAKE SURE YOU:   Understand these instructions.  Will watch your condition.  Will get help right away if you are not doing well or get worse. Document Released: 10/31/2004 Document Revised: 07/23/2011 Document Reviewed: 04/08/2011 [employer] Patient Information 2014 [employer].  

## 2012-08-03 NOTE — Progress Notes (Signed)
Subjective:     Patient ID: Patrick Brown, male   DOB: [birth year], 42 y.o.   MRN: [id number]  HPI Pt with rash to the R groin area He states this started as a rash  He showed it to his dern who felt it make be just due to heat Pt state area has become more red and now has drainage and ulceration Pt has also noted increase in his BS readings   Review of Systems  All other systems reviewed and are negative.       Objective:   Physical Exam  Nursing note and vitals reviewed. Pt with erythem area to the R groin involving scrotum Several areas show skin breakdown/ulceration + drainage No ing node Wound culture done     Assessment:     1. Open wound        Plan:     Keflex 500mg  1 po bid  Will await culture results before any change. Keep area clean and dry. OTC Clotrimazole for fungal aspect.  F/U pending labs

## 2012-08-05 ENCOUNTER — Other Ambulatory Visit: Payer: Self-pay | Admitting: *Deleted

## 2012-08-05 ENCOUNTER — Ambulatory Visit (INDEPENDENT_AMBULATORY_CARE_PROVIDER_SITE_OTHER): Payer: BC Managed Care – PPO | Admitting: Internal Medicine

## 2012-08-05 ENCOUNTER — Telehealth: Payer: Self-pay | Admitting: *Deleted

## 2012-08-05 ENCOUNTER — Encounter: Payer: Self-pay | Admitting: Internal Medicine

## 2012-08-05 ENCOUNTER — Encounter (HOSPITAL_COMMUNITY): Payer: Self-pay | Admitting: Emergency Medicine

## 2012-08-05 ENCOUNTER — Inpatient Hospital Stay (HOSPITAL_COMMUNITY)
Admission: EM | Admit: 2012-08-05 | Discharge: 2012-08-12 | DRG: 710 | Disposition: A | Discharge status: Home / Self Care | Payer: BC Managed Care – PPO | Attending: Internal Medicine | Admitting: Internal Medicine

## 2012-08-05 ENCOUNTER — Emergency Department (HOSPITAL_COMMUNITY): Payer: BC Managed Care – PPO

## 2012-08-05 VITALS — BP 120/74 | HR 120 | Temp 98.3°F | Ht 70.0 in | Wt 170.0 lb

## 2012-08-05 DIAGNOSIS — K3184 Gastroparesis: Secondary | ICD-10-CM | POA: Diagnosis present

## 2012-08-05 DIAGNOSIS — B20 Human immunodeficiency virus [HIV] disease: Principal | ICD-10-CM | POA: Diagnosis present

## 2012-08-05 DIAGNOSIS — IMO0002 Reserved for concepts with insufficient information to code with codable children: Secondary | ICD-10-CM

## 2012-08-05 DIAGNOSIS — J9600 Acute respiratory failure, unspecified whether with hypoxia or hypercapnia: Secondary | ICD-10-CM

## 2012-08-05 DIAGNOSIS — B379 Candidiasis, unspecified: Secondary | ICD-10-CM | POA: Diagnosis present

## 2012-08-05 DIAGNOSIS — D8481 Immunodeficiency due to conditions classified elsewhere: Secondary | ICD-10-CM

## 2012-08-05 DIAGNOSIS — E871 Hypo-osmolality and hyponatremia: Secondary | ICD-10-CM | POA: Diagnosis present

## 2012-08-05 DIAGNOSIS — B999 Unspecified infectious disease: Secondary | ICD-10-CM

## 2012-08-05 DIAGNOSIS — F329 Major depressive disorder, single episode, unspecified: Secondary | ICD-10-CM | POA: Diagnosis present

## 2012-08-05 DIAGNOSIS — R0902 Hypoxemia: Secondary | ICD-10-CM

## 2012-08-05 DIAGNOSIS — B59 Pneumocystosis: Secondary | ICD-10-CM

## 2012-08-05 DIAGNOSIS — E875 Hyperkalemia: Secondary | ICD-10-CM | POA: Diagnosis not present

## 2012-08-05 DIAGNOSIS — R569 Unspecified convulsions: Secondary | ICD-10-CM

## 2012-08-05 DIAGNOSIS — Z79899 Other long term (current) drug therapy: Secondary | ICD-10-CM

## 2012-08-05 DIAGNOSIS — R0602 Shortness of breath: Secondary | ICD-10-CM

## 2012-08-05 DIAGNOSIS — K219 Gastro-esophageal reflux disease without esophagitis: Secondary | ICD-10-CM | POA: Diagnosis present

## 2012-08-05 DIAGNOSIS — R000 Tachycardia, unspecified: Secondary | ICD-10-CM | POA: Diagnosis present

## 2012-08-05 DIAGNOSIS — D509 Iron deficiency anemia, unspecified: Secondary | ICD-10-CM | POA: Diagnosis present

## 2012-08-05 DIAGNOSIS — R6510 Systemic inflammatory response syndrome (SIRS) of non-infectious origin without acute organ dysfunction: Secondary | ICD-10-CM

## 2012-08-05 DIAGNOSIS — J189 Pneumonia, unspecified organism: Secondary | ICD-10-CM | POA: Diagnosis present

## 2012-08-05 DIAGNOSIS — S31109S Unspecified open wound of abdominal wall, unspecified quadrant without penetration into peritoneal cavity, sequela: Secondary | ICD-10-CM

## 2012-08-05 DIAGNOSIS — L304 Erythema intertrigo: Secondary | ICD-10-CM

## 2012-08-05 DIAGNOSIS — Z794 Long term (current) use of insulin: Secondary | ICD-10-CM

## 2012-08-05 DIAGNOSIS — J9601 Acute respiratory failure with hypoxia: Secondary | ICD-10-CM | POA: Diagnosis present

## 2012-08-05 DIAGNOSIS — F3289 Other specified depressive episodes: Secondary | ICD-10-CM | POA: Diagnosis present

## 2012-08-05 DIAGNOSIS — E785 Hyperlipidemia, unspecified: Secondary | ICD-10-CM | POA: Diagnosis present

## 2012-08-05 DIAGNOSIS — R55 Syncope and collapse: Secondary | ICD-10-CM

## 2012-08-05 DIAGNOSIS — I498 Other specified cardiac arrhythmias: Secondary | ICD-10-CM | POA: Diagnosis present

## 2012-08-05 DIAGNOSIS — R197 Diarrhea, unspecified: Secondary | ICD-10-CM | POA: Diagnosis present

## 2012-08-05 DIAGNOSIS — S31109A Unspecified open wound of abdominal wall, unspecified quadrant without penetration into peritoneal cavity, initial encounter: Secondary | ICD-10-CM

## 2012-08-05 DIAGNOSIS — E109 Type 1 diabetes mellitus without complications: Secondary | ICD-10-CM | POA: Diagnosis present

## 2012-08-05 DIAGNOSIS — Z9641 Presence of insulin pump (external) (internal): Secondary | ICD-10-CM

## 2012-08-05 DIAGNOSIS — R509 Fever, unspecified: Secondary | ICD-10-CM

## 2012-08-05 DIAGNOSIS — E1065 Type 1 diabetes mellitus with hyperglycemia: Secondary | ICD-10-CM | POA: Diagnosis present

## 2012-08-05 DIAGNOSIS — N183 Chronic kidney disease, stage 3 unspecified: Secondary | ICD-10-CM | POA: Diagnosis present

## 2012-08-05 DIAGNOSIS — E1029 Type 1 diabetes mellitus with other diabetic kidney complication: Secondary | ICD-10-CM | POA: Diagnosis present

## 2012-08-05 DIAGNOSIS — N058 Unspecified nephritic syndrome with other morphologic changes: Secondary | ICD-10-CM | POA: Diagnosis present

## 2012-08-05 DIAGNOSIS — E039 Hypothyroidism, unspecified: Secondary | ICD-10-CM | POA: Diagnosis present

## 2012-08-05 DIAGNOSIS — L299 Pruritus, unspecified: Secondary | ICD-10-CM

## 2012-08-05 DIAGNOSIS — A419 Sepsis, unspecified organism: Secondary | ICD-10-CM | POA: Diagnosis present

## 2012-08-05 HISTORY — DX: Chronic kidney disease, stage 3 (moderate): N18.3

## 2012-08-05 HISTORY — DX: Systemic inflammatory response syndrome (sirs) of non-infectious origin without acute organ dysfunction: R65.10

## 2012-08-05 HISTORY — DX: Asymptomatic human immunodeficiency virus (hiv) infection status: Z21

## 2012-08-05 HISTORY — DX: Pneumonia, unspecified organism: J18.9

## 2012-08-05 HISTORY — DX: Chronic kidney disease, stage 3 unspecified: N18.30

## 2012-08-05 LAB — CBC WITH DIFFERENTIAL/PLATELET
Basophils Absolute: 0.1 10*3/uL (ref 0.0–0.1)
Eosinophils Relative: 6 % — ABNORMAL HIGH (ref 0–5)
Lymphocytes Relative: 14 % (ref 12–46)
MCV: 89.7 fL (ref 78.0–100.0)
Neutrophils Relative %: 68 % (ref 43–77)
Platelets: 327 10*3/uL (ref 150–400)
RDW: 15.2 % (ref 11.5–15.5)
WBC: 14.5 10*3/uL — ABNORMAL HIGH (ref 4.0–10.5)

## 2012-08-05 LAB — COMPREHENSIVE METABOLIC PANEL
ALT: 9 U/L (ref 0–53)
AST: 13 U/L (ref 0–37)
CO2: 24 mEq/L (ref 19–32)
Calcium: 9.9 mg/dL (ref 8.4–10.5)
GFR calc non Af Amer: 44 mL/min — ABNORMAL LOW (ref >90)
Sodium: 129 mEq/L — ABNORMAL LOW (ref 135–145)
Total Protein: 6.4 g/dL (ref 6.0–8.3)

## 2012-08-05 LAB — URINALYSIS, ROUTINE W REFLEX MICROSCOPIC
Hgb urine dipstick: NEGATIVE
Specific Gravity, Urine: 1.024 (ref 1.005–1.030)
pH: 5 (ref 5.0–8.0)

## 2012-08-05 LAB — LACTATE DEHYDROGENASE: LDH: 529 U/L — ABNORMAL HIGH (ref 94–250)

## 2012-08-05 LAB — BLOOD GAS, ARTERIAL
Drawn by: 11249
O2 Content: 15 L/min
pCO2 arterial: 38.5 mmHg (ref 35.0–45.0)
pH, Arterial: 7.378 (ref 7.350–7.450)
pO2, Arterial: 185 mmHg — ABNORMAL HIGH (ref 80.0–100.0)

## 2012-08-05 LAB — GLUCOSE, CAPILLARY: Glucose-Capillary: 254 mg/dL — ABNORMAL HIGH (ref 70–99)

## 2012-08-05 LAB — MRSA PCR SCREENING: MRSA by PCR: NEGATIVE

## 2012-08-05 MED ORDER — INSULIN ASPART 100 UNIT/ML ~~LOC~~ SOLN: 0.0000 [IU] | Freq: Three times a day (TID) | SUBCUTANEOUS | Status: DC

## 2012-08-05 MED ORDER — VENLAFAXINE HCL ER 75 MG PO CP24
112.5000 mg | ORAL_CAPSULE | Freq: Every morning | ORAL | Status: DC
  Administered 2012-08-06 – 2012-08-12 (×7): 112.5 mg via ORAL
  Filled 2012-08-05 (×7): qty 1

## 2012-08-05 MED ORDER — TRAMADOL HCL 50 MG PO TABS
50.0000 mg | ORAL_TABLET | Freq: Four times a day (QID) | ORAL | Status: DC | PRN
  Administered 2012-08-10 (×2): 50 mg via ORAL
  Filled 2012-08-05 (×2): qty 1

## 2012-08-05 MED ORDER — ONDANSETRON HCL 4 MG PO TABS: 4.0000 mg | ORAL_TABLET | Freq: Three times a day (TID) | ORAL | Status: DC | PRN

## 2012-08-05 MED ORDER — SODIUM CHLORIDE 0.9 % IV SOLN
1000.0000 mL | Freq: Once | INTRAVENOUS | Status: AC
  Administered 2012-08-05: 1000 mL via INTRAVENOUS

## 2012-08-05 MED ORDER — DEXTROSE 5 % IV SOLN: 1.0000 g | Freq: Three times a day (TID) | INTRAVENOUS | Status: DC

## 2012-08-05 MED ORDER — PRIMAQUINE PHOSPHATE 26.3 MG PO TABS
30.0000 mg | ORAL_TABLET | Freq: Every day | ORAL | Status: DC
  Administered 2012-08-05 – 2012-08-07 (×3): 30 mg via ORAL
  Administered 2012-08-08 (×2): 15 mg via ORAL
  Administered 2012-08-09 – 2012-08-12 (×4): 30 mg via ORAL
  Filled 2012-08-05 (×10): qty 2

## 2012-08-05 MED ORDER — FERROUS SULFATE 325 (65 FE) MG PO TABS
650.0000 mg | ORAL_TABLET | Freq: Every day | ORAL | Status: DC
  Administered 2012-08-06 – 2012-08-12 (×7): 650 mg via ORAL
  Filled 2012-08-05 (×8): qty 2

## 2012-08-05 MED ORDER — ACETAMINOPHEN 500 MG PO TABS
1000.0000 mg | ORAL_TABLET | Freq: Two times a day (BID) | ORAL | Status: DC
  Administered 2012-08-05 – 2012-08-12 (×14): 1000 mg via ORAL
  Filled 2012-08-05 (×16): qty 2

## 2012-08-05 MED ORDER — OMEGA-3-ACID ETHYL ESTERS 1 G PO CAPS: 2.0000 g | ORAL_CAPSULE | Freq: Two times a day (BID) | ORAL | Status: DC

## 2012-08-05 MED ORDER — SODIUM POLYSTYRENE SULFONATE 15 GM/60ML PO SUSP: 30.0000 g | ORAL | Status: DC

## 2012-08-05 MED ORDER — DEXTROSE 5 % IV SOLN
1.0000 g | Freq: Three times a day (TID) | INTRAVENOUS | Status: DC
  Administered 2012-08-05 – 2012-08-06 (×3): 1 g via INTRAVENOUS
  Filled 2012-08-05 (×4): qty 1

## 2012-08-05 MED ORDER — FUROSEMIDE 20 MG PO TABS
20.0000 mg | ORAL_TABLET | Freq: Two times a day (BID) | ORAL | Status: DC
  Administered 2012-08-05 – 2012-08-06 (×2): 20 mg via ORAL
  Filled 2012-08-05 (×4): qty 1

## 2012-08-05 MED ORDER — LOSARTAN POTASSIUM 25 MG PO TABS
25.0000 mg | ORAL_TABLET | Freq: Every morning | ORAL | Status: DC
  Administered 2012-08-06: 25 mg via ORAL
  Filled 2012-08-05: qty 1

## 2012-08-05 MED ORDER — METHYLPREDNISOLONE SODIUM SUCC 40 MG IJ SOLR
40.0000 mg | Freq: Four times a day (QID) | INTRAMUSCULAR | Status: DC
  Administered 2012-08-05 – 2012-08-09 (×15): 40 mg via INTRAVENOUS
  Filled 2012-08-05 (×20): qty 1

## 2012-08-05 MED ORDER — ASPIRIN 81 MG PO CHEW
81.0000 mg | CHEWABLE_TABLET | Freq: Every morning | ORAL | Status: DC
  Administered 2012-08-06 – 2012-08-12 (×7): 81 mg via ORAL
  Filled 2012-08-05 (×7): qty 1

## 2012-08-05 MED ORDER — DEXTROSE 5 % IV SOLN: 1.0000 g | Freq: Once | INTRAVENOUS | Status: DC

## 2012-08-05 MED ORDER — SODIUM BICARBONATE 650 MG PO TABS
650.0000 mg | ORAL_TABLET | Freq: Every morning | ORAL | Status: DC
  Administered 2012-08-06 – 2012-08-12 (×7): 650 mg via ORAL
  Filled 2012-08-05 (×7): qty 1

## 2012-08-05 MED ORDER — BIOTENE DRY MOUTH MT LIQD
15.0000 mL | Freq: Two times a day (BID) | OROMUCOSAL | Status: DC
  Administered 2012-08-05 – 2012-08-12 (×13): 15 mL via OROMUCOSAL

## 2012-08-05 MED ORDER — LOSARTAN POTASSIUM 25 MG PO TABS: 25.0000 mg | ORAL_TABLET | Freq: Every morning | ORAL | Status: DC

## 2012-08-05 MED ORDER — LEVOTHYROXINE SODIUM 175 MCG PO TABS
175.0000 ug | ORAL_TABLET | Freq: Every day | ORAL | Status: DC
  Administered 2012-08-06 – 2012-08-12 (×7): 175 ug via ORAL
  Filled 2012-08-05 (×8): qty 1

## 2012-08-05 MED ORDER — DOLUTEGRAVIR SODIUM 50 MG PO TABS
50.0000 mg | ORAL_TABLET | Freq: Every day | ORAL | Status: DC
  Administered 2012-08-05 – 2012-08-11 (×7): 50 mg via ORAL
  Filled 2012-08-05 (×8): qty 1

## 2012-08-05 MED ORDER — ENOXAPARIN SODIUM 40 MG/0.4ML ~~LOC~~ SOLN
40.0000 mg | SUBCUTANEOUS | Status: DC
  Administered 2012-08-05 – 2012-08-10 (×6): 40 mg via SUBCUTANEOUS
  Filled 2012-08-05 (×7): qty 0.4

## 2012-08-05 MED ORDER — HYDROXYZINE HCL 25 MG PO TABS
25.0000 mg | ORAL_TABLET | Freq: Two times a day (BID) | ORAL | Status: DC
  Administered 2012-08-05 – 2012-08-07 (×4): 25 mg via ORAL
  Filled 2012-08-05 (×5): qty 1

## 2012-08-05 MED ORDER — INSULIN PUMP
SUBCUTANEOUS | Status: DC
  Administered 2012-08-06: 08:00:00 via SUBCUTANEOUS
  Filled 2012-08-05: qty 1

## 2012-08-05 MED ORDER — DAPSONE 100 MG PO TABS: 100.0000 mg | ORAL_TABLET | Freq: Every morning | ORAL | Status: DC

## 2012-08-05 MED ORDER — ABACAVIR SULFATE 300 MG PO TABS
600.0000 mg | ORAL_TABLET | Freq: Every day | ORAL | Status: DC
  Administered 2012-08-05 – 2012-08-11 (×7): 600 mg via ORAL
  Filled 2012-08-05 (×8): qty 2

## 2012-08-05 MED ORDER — PANTOPRAZOLE SODIUM 40 MG PO TBEC
40.0000 mg | DELAYED_RELEASE_TABLET | Freq: Every day | ORAL | Status: DC
  Administered 2012-08-06 – 2012-08-12 (×7): 40 mg via ORAL
  Filled 2012-08-05 (×7): qty 1

## 2012-08-05 MED ORDER — DEXTROSE 5 % IV SOLN: 500.0000 mg | Freq: Once | INTRAVENOUS | Status: DC

## 2012-08-05 MED ORDER — OMEGA-3-ACID ETHYL ESTERS 1 G PO CAPS
2.0000 g | ORAL_CAPSULE | Freq: Two times a day (BID) | ORAL | Status: DC
  Administered 2012-08-05 – 2012-08-12 (×14): 2 g via ORAL
  Filled 2012-08-05 (×15): qty 2

## 2012-08-05 MED ORDER — METOCLOPRAMIDE HCL 5 MG PO TABS
5.0000 mg | ORAL_TABLET | Freq: Three times a day (TID) | ORAL | Status: DC
  Administered 2012-08-06 – 2012-08-11 (×17): 5 mg via ORAL
  Filled 2012-08-05 (×19): qty 1

## 2012-08-05 MED ORDER — INSULIN ASPART 100 UNIT/ML ~~LOC~~ SOLN: 0.0000 [IU] | Freq: Every day | SUBCUTANEOUS | Status: DC

## 2012-08-05 MED ORDER — CLINDAMYCIN PHOSPHATE 600 MG/50ML IV SOLN
600.0000 mg | Freq: Three times a day (TID) | INTRAVENOUS | Status: DC
  Administered 2012-08-05 – 2012-08-09 (×12): 600 mg via INTRAVENOUS
  Filled 2012-08-05 (×13): qty 50

## 2012-08-05 MED ORDER — SIMVASTATIN 40 MG PO TABS
40.0000 mg | ORAL_TABLET | Freq: Every day | ORAL | Status: DC
  Administered 2012-08-05 – 2012-08-11 (×7): 40 mg via ORAL
  Filled 2012-08-05 (×8): qty 1

## 2012-08-05 MED ORDER — ABACAVIR SULFATE-LAMIVUDINE 600-300 MG PO TABS
1.0000 | ORAL_TABLET | Freq: Every day | ORAL | Status: DC
  Filled 2012-08-05: qty 1

## 2012-08-05 MED ORDER — VANCOMYCIN HCL IN DEXTROSE 750-5 MG/150ML-% IV SOLN
750.0000 mg | Freq: Two times a day (BID) | INTRAVENOUS | Status: DC
  Administered 2012-08-05 – 2012-08-06 (×2): 750 mg via INTRAVENOUS
  Filled 2012-08-05 (×2): qty 150

## 2012-08-05 MED ORDER — SODIUM CHLORIDE 0.9 % IV SOLN
1000.0000 mL | INTRAVENOUS | Status: DC
  Administered 2012-08-05 – 2012-08-06 (×3): 1000 mL via INTRAVENOUS

## 2012-08-05 MED ORDER — LAMIVUDINE 150 MG PO TABS
300.0000 mg | ORAL_TABLET | Freq: Every day | ORAL | Status: DC
  Administered 2012-08-05 – 2012-08-11 (×7): 300 mg via ORAL
  Filled 2012-08-05 (×8): qty 2

## 2012-08-05 MED ORDER — TESTOSTERONE 50 MG/5GM (1%) TD GEL
5.0000 g | Freq: Every morning | TRANSDERMAL | Status: DC
  Administered 2012-08-06 – 2012-08-12 (×7): 5 g via TRANSDERMAL
  Filled 2012-08-05 (×7): qty 5

## 2012-08-05 NOTE — Telephone Encounter (Signed)
Pt called reporting a fever for about 2 weeks, usually low-grade but spiked to 101.1 overnight.  Patient also endorsing a rash in his groin (started Cipro 500mg  QID on 08/03/12) and insomnia since starting his tivicay/epsicom.  Pt states he is taking 1000mg  - 1250mg  APAP Q3-4 hours.  RN told him to stop the tylenol, as he is exceeding the 3gm/day limit.  Appointment given with Dr. Linus Salmons today. Pt verbalized understanding. Landis Gandy, RN

## 2012-08-05 NOTE — Progress Notes (Signed)
PT HAS ORDER FOR INSULIN PUMP HE IS AT 1.6U/HR BASAL RATE AND WILL DECEASED THAT TO 1.2 U/HR AT 12MN.. 2100 CBG= 254 AND HE COVERED HIMSELF WITH 3 UNITS NOVOLOG FROM INSULIN PUMP

## 2012-08-05 NOTE — H&P (Signed)
[facility] History and Physical  Patrick Brown [id number] DOB: [birth year] DOA: 08/05/2012  Referring physician: Dr. Daleen Bo PCP: Redge Gainer, MD   Chief Complaint: Shortness of breath, malaise, fever, weakness.   History of Present Illness: Patrick Brown is an 42 y.o. male with a PMH of recently diagnosed HIV 05/2012, CD4 37 on 06/19/12, on Tivicay and epzicom, seen in ID clinic today by Dr. Linus Salmons and sent to the ER for further evaluation secondary to hypoxia and concerns for MAC.  The patient reports a low grade fever for 2 weeks, but went as high 101.1 in the past 24 hours.  He also reports generalized weakness/malaise for the past several weeks.  He has developed shortness of breath over the past 3-4 days, accompanied by a non-productive cough.  No aggravating or alleviating factors.  Review of Systems: Constitutional: + fever, no chills;  Appetite normal; No weight loss, no weight gain, + fatigue.  HEENT: No blurry vision, no diplopia, no pharyngitis, no dysphagia CV: No chest pain, no palpitations.  Resp: + SOB, + non-productive cough. GI: No nausea, no vomiting, no diarrhea, no melena, no hematochezia.  GU: No dysuria, no hematuria. MSK: no myalgias, no arthralgias.  Neuro:  No headache, no focal neurological deficits, no history of seizures.  Psych: No depression, no anxiety.  Endo: No heat intolerance, no cold intolerance, no polyuria, no polydipsia  Skin: + groin/scrotal rash, no skin lesions.  Heme: No easy bruising.  Past Medical History Past Medical History  Diagnosis Date  . IDDM (insulin dependent diabetes mellitus)     38 years  . Retinopathy     x2  . Proteinuria   . Dyslipidemia   . Anemia, iron deficiency On procrit  . Hematuria, microscopic 10/09    work up negative (Dr. Amalia Hailey)  . Hypothyroidism   . CKD (chronic kidney disease)   . Low HDL (under 40)   . Hyperkalemia, diminished renal excretion 06/2011 secondary to TMP/SMZ; prior secondary to  ARBS;      Known potassium excretory defect; history of recurrent hyperkalemia due to diabetic renal disease; ACE/ARB contraindicated; hyperkalemia 06/2011 secondary to TMP-SMZ  . Hyperkalemia   . Depression   . Gastroparesis diabeticorum   . Gastroesophageal reflux disease   . Degenerative arthritis   . HIV positive   . CKD (chronic kidney disease) stage 3, GFR 30-59 ml/min   . SIRS (systemic inflammatory response syndrome)   . CAP (community acquired pneumonia)      Past Surgical History Past Surgical History  Procedure Laterality Date  . Eye surgery  [phone]    x2   . Vitrectomy  bilateral  . Insulin pump    . Colonoscopy    . Esophagogastroduodenoscopy    . Lasik Bilateral   . Cataract extraction Right      Social History: History   Social History  . Marital Status: Single    Spouse Name: N/A    Number of Children: 0  . Years of Education: AAS   Occupational History  . EMT     [place] History Main Topics  . Smoking status: Never Smoker   . Smokeless tobacco: Never Used  . Alcohol Use: No     Comment: Infrequent, less than 1 x per month.  . Drug Use: No  . Sexually Active: Not Currently   Other Topics Concern  . Not on file   Social History Narrative   Single.  Lives at home with  mom.  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     Family History:  Family History  Problem Relation Age of Onset  . Diabetes type I Brother   . Prostate cancer Other   . Dementia Other   . Dementia Other   . Dementia Other     Allergies: Sulfa antibiotics; Ramipril; and Versed  Meds: Prior to Admission medications   Medication Sig Start Date End Date Taking? Authorizing Provider  abacavir-lamiVUDine (EPZICOM) 600-300 MG per tablet Take 1 tablet by mouth at bedtime. 07/15/12  Yes Thayer Headings, MD  acetaminophen (TYLENOL) 500 MG tablet Take 1,000 mg by mouth 2 (two) times daily.    Yes Historical Provider, MD  aspirin 81 MG chewable tablet Chew 81 mg by mouth every  morning.   Yes Historical Provider, MD  cephALEXin (KEFLEX) 500 MG capsule Take 1 capsule (500 mg total) by mouth 4 (four) times daily. 08/03/12  Yes Lodema Pilot, PA-C  dapsone 100 MG tablet Take 100 mg by mouth every morning. 07/15/12  Yes Thayer Headings, MD  dolutegravir (TIVICAY) 50 MG tablet Take 50 mg by mouth at bedtime. 07/15/12  Yes Thayer Headings, MD  epoetin alfa (EPOGEN,PROCRIT) 16109 UNIT/ML injection Inject 20,000 Units into the skin every 30 (thirty) days.   Yes Historical Provider, MD  esomeprazole (NEXIUM) 40 MG capsule Take 40 mg by mouth daily before breakfast.   Yes Historical Provider, MD  ferrous sulfate 325 (65 FE) MG tablet Take 650 mg by mouth daily with breakfast.    Yes Historical Provider, MD  furosemide (LASIX) 20 MG tablet Take 20 mg by mouth 2 (two) times daily.   Yes Historical Provider, MD  Glucosamine-Chondroit-Vit C-Mn (GLUCOSAMINE 1500 COMPLEX PO) Take 1 tablet by mouth 2 (two) times daily.    Yes Historical Provider, MD  hydrOXYzine (ATARAX/VISTARIL) 25 MG tablet Take 25 mg by mouth 2 (two) times daily.   Yes Historical Provider, MD  Insulin Human (INSULIN PUMP) 100 unit/ml SOLN Inject into the skin continuous. He uses Novolog Insulin in pump. Per sliding scale.   Yes Historical Provider, MD  levothyroxine (SYNTHROID, LEVOTHROID) 175 MCG tablet Take 1 tablet (175 mcg total) by mouth every morning. 05/29/12  Yes Chipper Herb, MD  losartan (COZAAR) 25 MG tablet Take 25 mg by mouth every morning.   Yes Historical Provider, MD  metoCLOPramide (REGLAN) 5 MG tablet Take 1 tablet (5 mg total) by mouth 3 (three) times daily before meals. 05/01/12  Yes Chipper Herb, MD  omega-3 acid ethyl esters (LOVAZA) 1 G capsule Take 2 g by mouth 2 (two) times daily.     Yes Historical Provider, MD  simvastatin (ZOCOR) 40 MG tablet Take 40 mg by mouth at bedtime.     Yes Historical Provider, MD  sodium bicarbonate 650 MG tablet Take 650 mg by mouth every morning.    Yes Historical  Provider, MD  sodium polystyrene (KAYEXALATE) 15 GM/60ML suspension Take 30 g by mouth once a week.    Yes Historical Provider, MD  testosterone (ANDROGEL) 50 MG/5GM GEL Place 5 g onto the skin every morning.    Yes Historical Provider, MD  traMADol (ULTRAM) 50 MG tablet Take 50 mg by mouth every 6 (six) hours as needed for pain.   Yes Historical Provider, MD  venlafaxine XR (EFFEXOR-XR) 37.5 MG 24 hr capsule Take 112.5 mg by mouth every morning.    Yes Historical Provider, MD    Physical Exam: Filed Vitals:   08/05/12  1613 08/05/12 1700 08/05/12 1720 08/05/12 1740  BP: 115/71 122/72 123/64 127/64  Pulse: 109 99    Temp: 98.3 F (36.8 C)     TempSrc: Oral     Resp: 34 27 27 22   Height: 5' 10.08" (1.78 m)     Weight: 77.1 kg (169 lb 15.6 oz)     SpO2: 89% 94%       Physical Exam: Blood pressure 127/64, pulse 99, temperature 98.3 F (36.8 C), temperature source Oral, resp. rate 22, height 5' 10.08" (1.78 m), weight 77.1 kg (169 lb 15.6 oz), SpO2 94.00%. Gen: No acute distress.  Hypoxic. Head: Normocephalic, atraumatic.   Eyes: PERRL, EOMI, sclerae nonicteric. Mouth: Oropharynx clear, no thrush or mouth sores. Neck: Supple, no thyromegaly, no lymphadenopathy, no jugular venous distention. Chest: Lungs diminished with bibasilar crackles. CV: Heart sounds are tachycardic, no M/R/G. Abdomen: Soft, nontender, nondistended with normal active bowel sounds. GU: Normal male genitalia.  Some scrotal erythema/rash. Extremities: Extremities are without clubbing, edema or cyanosis. Skin: Warm and dry.  Scabbed areas from where he has scratched himself. Neuro: Alert and oriented times 3; cranial nerves II through XII grossly intact. Psych: Mood and affect normal.  Labs on Admission:  Basic Metabolic Panel:  Recent Labs Lab 08/03/12 1623 08/05/12 1630  NA 133* 129*  K 4.9 4.7  CL 99 93*  CO2 24 24  GLUCOSE 238* 347*  BUN 38* 45*  CREATININE 1.92* 1.80*  CALCIUM 9.5 9.9   Liver  Function Tests:  Recent Labs Lab 08/05/12 1630  AST 13  ALT 9  ALKPHOS 102  BILITOT 0.3  PROT 6.4  ALBUMIN 2.3*   CBC:  Recent Labs Lab 08/05/12 1630  WBC 14.5*  NEUTROABS 9.8*  HGB 8.9*  HCT 28.0*  MCV 89.7  PLT 327   BNP (last 3 results)  Recent Labs  04/16/12 2020 04/16/12 2220  PROBNP 66.5 94.3    Radiological Exams on Admission: Dg Chest 2 View  08/05/2012   *RADIOLOGY REPORT*  Clinical Data: Shortness of breath.  Poor oxygen saturation.  CHEST - 2 VIEW  Comparison: 07/08/2012 and multiple previous  Findings: Artifact overlies chest.  Heart size is normal.  There are patchy infiltrates throughout both lungs, most pronounced at the lung bases, consistent with pneumonia.  No effusions.  Old compression deformities are seen in the upper thoracic region.  IMPRESSION: Widespread bilateral bronchopneumonia most pronounced in the lower lobes.   Original Report Authenticated By: Nelson Chimes, M.D.    Assessment/Plan Principal Problem:   HCAP (healthcare-associated pneumonia) with acute hypoxic respiratory failure and hypoxia -Given immunocompromise and recent hospitalization, need to cover both HCAP and PCP. -Discussed case with Dr. Wendie Agreste who recommends clindamycin and Primaquin for PCP and cefepime/vancomycin for HCAP. ID will see the patient in consultation 08/06/2012. -Blood cultures/AFB blood cultures requested. -Strep pneumonia and Legionella antigens requested. - Admit to the step down unit. Continuous pulse oximetry. Check ABG. -Supplemental oxygen as needed to keep oxygen saturations greater than 92% ordered. -Solu-Medrol 40 mg IV every 6 hours ordered. Active Problems:   Chronic kidney disease, stage 3, mod decreased GFR -Baseline creatinine 1.6-1.8. Current creatinine at usual baseline values.   DM (diabetes mellitus), type 1, uncontrolled -Continue insulin pump.  Will check CBGs Q AC/HS and cover with insulin resistant SSI. -Consult diabetes coordinator.    Hypothyroid -Continue home dose of Synthroid.   Anemia, iron deficiency -Continue iron therapy. Also receives Aranesp as an outpatient.   Gastroparesis -Continue Reglan.  Sinus tachycardia -Likely from hypoxia.   HIV disease -Continue HIV therapy. Followup CD4 count.   Hyponatremia -May be reflective of hyperglycemia. Correct elevated glucose and monitor.  Code Status: Full. Family Communication: Mother at the bedside. Disposition Plan: Home when stable.  Time spent: 1 hour.  RAMA,CHRISTINA [facility] Pager [phone]  If 7PM-7AM, please contact night-coverage [email or web address] Password [facility] 08/05/2012, 6:07 PM

## 2012-08-05 NOTE — Progress Notes (Signed)
  Subjective:    Patient ID: Patrick Brown, male    DOB: Feb 12, 1970, 42 y.o.   MRN: [id number]  Rash Associated symptoms include fatigue, a fever and shortness of breath. Pertinent negatives include no cough, diarrhea or sore throat.   He comes in for a work in visit.  He is recently diagnosed with HIV/AIDS and started on Tivicay and epzicom.  He also has type 1 diabetes, seizure disorder and renal insufficiency.  He has had complaints of symptomatic of subjective shortness of breath but no significant hypoxia. This previously was intermittent. He DC did see his primary doctor and has been scheduled to see pulmonary but continues to be symptomatic. He comes in today here with the same complaints as well as groin wound and actually is hypoxic to the 70s on room air. After placing oxygen his oxygen saturation did get up to 89% maximum but was decreased again with activity. He was rechecked again off of oxygen and it was again the 80s. He has not had no fever over 101 but did have subjective fever. He has been taking his HIV medications, dapsone for PCP prophylaxis, andwas started on Keflex by his primary doctor and over-the-counter Chlortrimazole for presumed yeast infection in his right groin. No history of any lung disease.   Review of Systems  Constitutional: Positive for fever, activity change, appetite change, fatigue and unexpected weight change.  HENT: Negative for sore throat and trouble swallowing.   Respiratory: Positive for shortness of breath. Negative for cough and wheezing.   Cardiovascular: Negative for chest pain and leg swelling.  Gastrointestinal: Positive for nausea. Negative for abdominal pain and diarrhea.  Genitourinary: Negative for genital sores.  Musculoskeletal: Negative for myalgias and arthralgias.  Skin: Positive for rash and wound.  Neurological: Negative for dizziness and headaches.  Hematological: Negative for adenopathy.       Objective:   Physical Exam   Constitutional:  10, in no acute distress, mildly ill-appearing  HENT:  Mouth/Throat: No oropharyngeal exudate.  Eyes: No scleral icterus.  Cardiovascular: Normal rate and normal heart sounds.   tachycardic  Pulmonary/Chest:  I hear crackles on the left base, overall also I hear diminished air entry though no respiratory distress.  Abdominal: Soft. Bowel sounds are normal. He exhibits no distension.  Lymphadenopathy:    He has no cervical adenopathy.  Skin:  He does have a right groin lesion and it does wrap around his torso. There is an open lesion with some purulent  Psychiatric: He has a normal mood and affect. His behavior is normal.          Assessment & Plan:

## 2012-08-05 NOTE — Telephone Encounter (Signed)
Pt says he needs this asap, very sick

## 2012-08-05 NOTE — ED Notes (Signed)
[id number] Expected date:<BR> Expected time:<BR> Means of arrival:<BR> Comments:<BR> 114 HR, cough 94% on 6 L

## 2012-08-05 NOTE — ED Provider Notes (Signed)
History    CSN: [id number] Arrival date & time 08/05/12  69  First MD Initiated Contact with Patient 08/05/12 1550     Chief Complaint  Patient presents with  . Shortness of Breath   (Consider location/radiation/quality/duration/timing/severity/associated sxs/prior Treatment) HPI Comments: Patrick Brown is a 42 y.o. male who presents for evaluation of hypoxia. He went to his PCP office, today, to be evaluated for shortness of breath, and weakness. While there his oxygen was found to be low at 77%, on room air. He was placed on oxygen and feels better. He was treated for community acquired pneumonia 1 month ago, as an outpatient with oral medications. He is followed in the ID clinic. He saw his PCP, 2 days ago and was diagnosed with right groin cellulitis, and fungal infection. He was started on Keflex, and told to use an over-the-counter topical antifungal medication, which he has not yet started. He denies nausea, vomiting, chest pain, abdominal pain, paresthesias, or headache.. he had a fever yesterday to 101. He has a nonproductive cough. There no other known modifying factors.  Patient is a 42 y.o. male presenting with shortness of breath. The history is provided by the patient.  Shortness of Breath  Past Medical History  Diagnosis Date  . IDDM (insulin dependent diabetes mellitus)     38 years  . Retinopathy     x2  . Proteinuria   . Dyslipidemia   . Anemia, iron deficiency On procrit  . Hematuria, microscopic 10/09    work up negative (Dr. Amalia Hailey)  . Hypothyroidism   . CKD (chronic kidney disease)   . Low HDL (under 40)   . 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 Hyperkalemia   . Depression   . Gastroparesis diabeticorum   . Gastroesophageal reflux  disease   . Degenerative arthritis   . HIV positive   . CKD (chronic kidney disease) stage 3, GFR 30-59 ml/min   . SIRS (systemic inflammatory response syndrome)   . CAP (community acquired pneumonia)    Past Surgical History  Procedure Laterality Date  . Eye surgery  [phone]    x2   . Vitrectomy  bilateral  . Insulin pump    . Colonoscopy    . Esophagogastroduodenoscopy    . Lasik Bilateral   . Cataract extraction Right    Family History  Problem Relation Age of Onset  . Diabetes type I Brother   . Prostate cancer Other   . Dementia Other   . Dementia Other   . Dementia Other    History  Substance Use Topics  . Smoking status: Never Smoker   . Smokeless tobacco: Never Used  . Alcohol Use: No     Comment: occas , one monthly    Review of Systems  Respiratory: Positive for shortness of breath.   All other systems reviewed and are negative.    Allergies  Sulfa antibiotics; Ramipril; and Versed  Home Medications   Current Outpatient Rx  Name  Route  Sig  Dispense  Refill  . abacavir-lamiVUDine (EPZICOM) 600-300 MG per tablet   Oral   Take 1 tablet by mouth at bedtime.         Marland Kitchen acetaminophen (TYLENOL) 500 MG tablet   Oral   Take 1,000 mg by mouth 2 (two)  times daily.          Marland Kitchen aspirin 81 MG chewable tablet   Oral   Chew 81 mg by mouth every morning.         . cephALEXin (KEFLEX) 500 MG capsule   Oral   Take 1 capsule (500 mg total) by mouth 4 (four) times daily.   20 capsule   0   . dapsone 100 MG tablet   Oral   Take 100 mg by mouth every morning.         . dolutegravir (TIVICAY) 50 MG tablet   Oral   Take 50 mg by mouth at bedtime.         Marland Kitchen epoetin alfa (EPOGEN,PROCRIT) 22025 UNIT/ML injection   Subcutaneous   Inject 20,000 Units into the skin every 30 (thirty) days.         Marland Kitchen esomeprazole (NEXIUM) 40 MG capsule   Oral   Take 40 mg by mouth daily before breakfast.         . ferrous sulfate 325 (65 FE) MG tablet    Oral   Take 650 mg by mouth daily with breakfast.          . furosemide (LASIX) 20 MG tablet   Oral   Take 20 mg by mouth 2 (two) times daily.         . Glucosamine-Chondroit-Vit C-Mn (GLUCOSAMINE 1500 COMPLEX PO)   Oral   Take 1 tablet by mouth 2 (two) times daily.          . hydrOXYzine (ATARAX/VISTARIL) 25 MG tablet   Oral   Take 25 mg by mouth 2 (two) times daily.         . Insulin Human (INSULIN PUMP) 100 unit/ml SOLN   Subcutaneous   Inject into the skin continuous. He uses Novolog Insulin in pump. Per sliding scale.         . levothyroxine (SYNTHROID, LEVOTHROID) 175 MCG tablet   Oral   Take 1 tablet (175 mcg total) by mouth every morning.   30 tablet   9   . losartan (COZAAR) 25 MG tablet   Oral   Take 25 mg by mouth every morning.         . metoCLOPramide (REGLAN) 5 MG tablet   Oral   Take 1 tablet (5 mg total) by mouth 3 (three) times daily before meals.   90 tablet   5   . omega-3 acid ethyl esters (LOVAZA) 1 G capsule   Oral   Take 2 g by mouth 2 (two) times daily.           . simvastatin (ZOCOR) 40 MG tablet   Oral   Take 40 mg by mouth at bedtime.           . sodium bicarbonate 650 MG tablet   Oral   Take 650 mg by mouth every morning.          . sodium polystyrene (KAYEXALATE) 15 GM/60ML suspension   Oral   Take 30 g by mouth once a week.          . testosterone (ANDROGEL) 50 MG/5GM GEL   Transdermal   Place 5 g onto the skin every morning.          . traMADol (ULTRAM) 50 MG tablet   Oral   Take 50 mg by mouth every 6 (six) hours as needed for pain.         Marland Kitchen venlafaxine XR (  EFFEXOR-XR) 37.5 MG 24 hr capsule   Oral   Take 112.5 mg by mouth every morning.           BP 115/71  Pulse 109  Temp(Src) 98.3 F (36.8 C) (Oral)  Resp 34  Ht 5' 10.08" (1.78 m)  Wt 169 lb 15.6 oz (77.1 kg)  BMI 24.33 kg/m2  SpO2 89% Physical Exam  Nursing note and vitals reviewed. Constitutional: He is oriented to person, place,  and time. He appears well-developed and well-nourished.  HENT:  Head: Normocephalic and atraumatic.  Right Ear: External ear normal.  Left Ear: External ear normal.  Eyes: Conjunctivae and EOM are normal. Pupils are equal, round, and reactive to light.  Neck: Normal range of motion and phonation normal. Neck supple.  Cardiovascular: Normal rate, regular rhythm, normal heart sounds and intact distal pulses.   Pulmonary/Chest: Effort normal. He exhibits no bony tenderness.  Decreased breath sounds bilaterally with rhonchi and rales right greater than left. No wheezing. No increased work of breathing.  Abdominal: Soft. Normal appearance. There is no tenderness.  Musculoskeletal: Normal range of motion.  Neurological: He is alert and oriented to person, place, and time. He has normal strength. No cranial nerve deficit or sensory deficit. He exhibits normal muscle tone. Coordination normal.  Skin: Skin is warm, dry and intact.  Right groin with small superficial ulceration and scattered excoriations; no associated redness, swelling, fluctuance, or significant drainage. Normal penis, scrotum, and scrotal contents; the testes are nontender.  Psychiatric: He has a normal mood and affect. His behavior is normal. Judgment and thought content normal.    ED Course  Procedures (including critical care time)  Medications  0.9 %  sodium chloride infusion (1,000 mLs Intravenous New Bag/Given 08/05/12 1652)    Followed by  0.9 %  sodium chloride infusion (not administered)    Followed by  0.9 %  sodium chloride infusion (not administered)  ceFEPIme (MAXIPIME) 1 g in dextrose 5 % 50 mL IVPB (1 g Intravenous New Bag/Given 08/05/12 1653)  vancomycin (VANCOCIN) IVPB 750 mg/150 ml premix (not administered)    Oxygen, titrated to face mask for hypoxia, while on 6 L nasal cannula. O2 remained low at 92% on 100% face mask oxygen.   CRITICAL CARE Performed by: Richarda Blade Total critical care time: 40  minutes IV fluids for sepsis, empiric antibiotics for healthcare associated pneumonia , oxygen titrated for hypoxia.  Critical care time was exclusive of separately billable procedures and treating other patients. Critical care was necessary to treat or prevent imminent or life-threatening deterioration. Critical care was time spent personally by with the following activities: development of treatment plan with patient and/or surrogate as well as nursing, discussions with consultants, evaluation of patient's response to treatment, examination of patient, obtaining history from patient or surrogate, ordering and performing treatments and interventions, ordering and review of laboratory studies, ordering and review of radiographic studies, pulse oximetry and re-evaluation of patient's condition.  Labs Reviewed  CBC WITH DIFFERENTIAL - Abnormal; Notable for the following:    WBC 14.5 (*)    RBC 3.12 (*)    Hemoglobin 8.9 (*)    HCT 28.0 (*)    Neutro Abs 9.8 (*)    Monocytes Absolute 1.7 (*)    Eosinophils Relative 6 (*)    Eosinophils Absolute 0.9 (*)    All other components within normal limits  COMPREHENSIVE METABOLIC PANEL - Abnormal; Notable for the following:    Sodium 129 (*)    Chloride  93 (*)    Glucose, Bld 347 (*)    BUN 45 (*)    Creatinine, Ser 1.80 (*)    Albumin 2.3 (*)    GFR calc non Af Amer 44 (*)    GFR calc Af Amer 52 (*)    All other components within normal limits  CULTURE, BLOOD (ROUTINE X 2)  CULTURE, BLOOD (ROUTINE X 2)  URINE CULTURE  GRAM STAIN  AFB CULTURE, BLOOD  URINALYSIS, ROUTINE W REFLEX MICROSCOPIC  LEGIONELLA ANTIGEN, URINE  STREP PNEUMONIAE URINARY ANTIGEN  CG4 I-STAT (LACTIC ACID)   Dg Chest 2 View  08/05/2012   *RADIOLOGY REPORT*  Clinical Data: Shortness of breath.  Poor oxygen saturation.  CHEST - 2 VIEW  Comparison: 07/08/2012 and multiple previous  Findings: Artifact overlies chest.  Heart size is normal.  There are patchy infiltrates  throughout both lungs, most pronounced at the lung bases, consistent with pneumonia.  No effusions.  Old compression deformities are seen in the upper thoracic region.  IMPRESSION: Widespread bilateral bronchopneumonia most pronounced in the lower lobes.   Original Report Authenticated By: Nelson Chimes, M.D.   1. Healthcare-associated pneumonia   2. Hypoxia   3. Immunocompromised status associated with infection     MDM  Immunocompromised patient, with widespread pneumonia, recently failed treatment, as outpatient. His CD4 count is low. He is at risk for TB, a TB blood culture has been ordered. 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 His SIRS positive with normal. Lactate; therefore, does not have severe sepsis. He, however, has an oxygen deficit that is significant. He will need to be admitted to a close observation unit, such as, step-down.   Nursing Notes Reviewed/ Care Coordinated, and agree without changes. Applicable Imaging Reviewed.  Interpretation of Laboratory Data incorporated into ED treatment   Plan: Admit  Richarda Blade, MD 08/05/12 2241

## 2012-08-05 NOTE — Progress Notes (Signed)
ABG drawn. Showed RN and asked RN to call MD about changing his oxygen device and to let me know.

## 2012-08-05 NOTE — Progress Notes (Signed)
Pt can not tolerated NRB mask and wants [place] back on at 6L--Put [place] on at patient request and sats 86-88 increased to 8L and pt tolerating. States he prefers to have sats lower and have mask off. Respiratory is aware and will attempt a face tent that does not actually fit on face. AT the moment tolerating 8L [place] and sats 88-90%

## 2012-08-05 NOTE — Assessment & Plan Note (Signed)
He seems to have an acute hypoxic episode of unknown etiology. On exam he does have significant pulmonary findings as described though does not seem in any imminent distress. However with the significant hypoxia and diminished exam, I am going to have him evaluated in the emergency room. I did discuss the case with the hospitalist and feel it best for him to be evaluated in the emergency room first due to the acute respiratory failure along with tachycardia. Concern for a shunt and possible need for ABG. He likely will need a chest x-ray and other valuation.  Also with a fever, I would consider an AFB blood culture do to his CD4 count being under 54 possible MAC disease.

## 2012-08-05 NOTE — Telephone Encounter (Signed)
LAST LABS 06/09/12. LAST OV 07/3012.

## 2012-08-05 NOTE — Progress Notes (Signed)
ANTIBIOTIC CONSULT NOTE - INITIAL  Pharmacy Consult for Vancomycin Indication: pneumonia  Allergies  Allergen Reactions  . Sulfa Antibiotics Other (See Comments)    High potassium  . Ramipril Cough  . Versed (Midazolam) Other (See Comments)    "I don't wake up very good or clear it out of my system"    Patient Measurements: Height: 5' 10.08" (178 cm) Weight: 169 lb 15.6 oz (77.1 kg) IBW/kg (Calculated) : 73.18   Vital Signs: Temp: 98.3 F (36.8 C) (07/02 1613) Temp src: Oral (07/02 1613) BP: 115/71 mmHg (07/02 1613) Pulse Rate: 109 (07/02 1613) Intake/Output from previous day:   Intake/Output from this shift:    Labs:  Recent Labs  08/03/12 1623  CREATININE 1.92*   Estimated Creatinine Clearance: 51.4 ml/min (by C-G formula based on Cr of 1.92). No results found for this basename: VANCOTROUGH, VANCOPEAK, VANCORANDOM, GENTTROUGH, GENTPEAK, GENTRANDOM, TOBRATROUGH, TOBRAPEAK, TOBRARND, AMIKACINPEAK, AMIKACINTROU, AMIKACIN,  in the last 72 hours   Microbiology: No results found for this or any previous visit (from the past 720 hour(s)).  Medical History: Past Medical History  Diagnosis Date  . IDDM (insulin dependent diabetes mellitus)     38 years  . Retinopathy     x2  . Proteinuria   . Dyslipidemia   . Anemia, iron deficiency On procrit  . Hematuria, microscopic 10/09    work up negative (Dr. Amalia Hailey)  . Hypothyroidism   . CKD (chronic kidney disease)   . Low HDL (under 40)   . 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 Hyperkalemia   . Depression   . Gastroparesis diabeticorum   . Gastroesophageal reflux disease   . Degenerative arthritis   . HIV positive   . CKD (chronic kidney disease) stage 3, GFR 30-59 ml/min   . SIRS  (systemic inflammatory response syndrome)   . CAP (community acquired pneumonia)     Medications:  Scheduled:   Infusions:  . sodium chloride     Followed by  . sodium chloride     Followed by  . sodium chloride    . ceFEPime (MAXIPIME) IV     PRN:  Assessment:  42 yo M presents to ER with SOB, starting empiric antibiotics for PNA with vancomycin and cefepime   Patient is HIV + and was treated for CAP 1 month ago as an outpatient.  He also was diagnosed with R groin cellulitis and a fungal infection 2 days ago by his PCP and was started on keflex, an instructed to use an OTC topical antifungal cream.    History of CKD with current Scr elevated at 1.92 with estimated CrCl 51 ml/min   CBC pending  Blood cultures sent 7/2   Goal of Therapy:  Vancomycin trough level 15-20 mcg/ml  Plan:  1.) Vancomycin 750 mg IV Q12h 2.) Cefepime 1 gram IV q8h per MD orders 3.) Will f/u need for antifungal cream and resuming HAART regimen 4.) Monitor renal function, Vancomycin trough at Css, f/u cultures    Patrick Brown, Patrick Brown PharmD Pager #: [phone] 4:44 PM 08/05/2012

## 2012-08-05 NOTE — ED Notes (Signed)
Pt has had fever, SOB, and general fatigue x 3 days. Pt was at infectious disease doctor (HIV pos) and O2 was 88% on RA. 6L on [place] 94 %. Sent here. AxOx4.

## 2012-08-06 ENCOUNTER — Encounter (HOSPITAL_COMMUNITY): Payer: Self-pay | Admitting: Nurse Practitioner

## 2012-08-06 ENCOUNTER — Encounter: Payer: Self-pay | Admitting: Internal Medicine

## 2012-08-06 ENCOUNTER — Other Ambulatory Visit: Payer: Self-pay | Admitting: Family Medicine

## 2012-08-06 ENCOUNTER — Other Ambulatory Visit (HOSPITAL_COMMUNITY): Payer: Self-pay | Admitting: *Deleted

## 2012-08-06 DIAGNOSIS — R509 Fever, unspecified: Secondary | ICD-10-CM

## 2012-08-06 DIAGNOSIS — R0902 Hypoxemia: Secondary | ICD-10-CM

## 2012-08-06 LAB — INFLUENZA PANEL BY PCR (TYPE A & B)
H1N1 flu by pcr: NOT DETECTED
Influenza A By PCR: NEGATIVE
Influenza B By PCR: NEGATIVE

## 2012-08-06 LAB — BASIC METABOLIC PANEL
CO2: 22 mEq/L (ref 19–32)
Calcium: 9.4 mg/dL (ref 8.4–10.5)
Calcium: 9.5 mg/dL (ref 8.4–10.5)
Creatinine, Ser: 1.58 mg/dL — ABNORMAL HIGH (ref 0.50–1.35)
Creatinine, Ser: 1.56 mg/dL — ABNORMAL HIGH (ref 0.50–1.35)
GFR calc Af Amer: 61 mL/min — ABNORMAL LOW (ref >90)
GFR calc Af Amer: 60 mL/min — ABNORMAL LOW (ref >90)
GFR calc non Af Amer: 53 mL/min — ABNORMAL LOW (ref >90)
GFR calc non Af Amer: 53 mL/min — ABNORMAL LOW (ref >90)
Glucose, Bld: 537 mg/dL — ABNORMAL HIGH (ref 70–99)
Potassium: 4.5 mEq/L (ref 3.5–5.1)
Sodium: 131 mEq/L — ABNORMAL LOW (ref 135–145)
Sodium: 127 mEq/L — ABNORMAL LOW (ref 135–145)

## 2012-08-06 LAB — CBC
Hemoglobin: 8.4 g/dL — ABNORMAL LOW (ref 13.0–17.0)
MCHC: 32.4 g/dL (ref 30.0–36.0)
Platelets: 312 10*3/uL (ref 150–400)
RBC: 2.93 MIL/uL — ABNORMAL LOW (ref 4.22–5.81)

## 2012-08-06 LAB — GLUCOSE, CAPILLARY
Glucose-Capillary: 482 mg/dL — ABNORMAL HIGH (ref 70–99)
Glucose-Capillary: 541 mg/dL — ABNORMAL HIGH (ref 70–99)
Glucose-Capillary: 551 mg/dL (ref 70–99)
Glucose-Capillary: 475 mg/dL — ABNORMAL HIGH (ref 70–99)

## 2012-08-06 MED ORDER — SODIUM CHLORIDE 0.9 % IV SOLN: INTRAVENOUS | Status: DC

## 2012-08-06 MED ORDER — GLUCERNA SHAKE PO LIQD
237.0000 mL | Freq: Two times a day (BID) | ORAL | Status: DC
  Administered 2012-08-06: 237 mL via ORAL
  Filled 2012-08-06 (×13): qty 237

## 2012-08-06 MED ORDER — INSULIN REGULAR HUMAN 100 UNIT/ML IJ SOLN
INTRAMUSCULAR | Status: DC
  Administered 2012-08-06: 4.2 [IU]/h via INTRAVENOUS
  Administered 2012-08-07: 13.8 [IU]/h via INTRAVENOUS
  Administered 2012-08-07: 8.8 [IU]/h via INTRAVENOUS
  Filled 2012-08-06 (×4): qty 1

## 2012-08-06 MED ORDER — INSULIN REGULAR BOLUS VIA INFUSION
0.0000 [IU] | Freq: Three times a day (TID) | INTRAVENOUS | Status: DC
  Administered 2012-08-07: 6.2 [IU] via INTRAVENOUS
  Administered 2012-08-07: 5.7 [IU] via INTRAVENOUS
  Administered 2012-08-07: 6.5 [IU] via INTRAVENOUS
  Filled 2012-08-06: qty 10

## 2012-08-06 MED ORDER — INSULIN ASPART 100 UNIT/ML ~~LOC~~ SOLN
12.0000 [IU] | Freq: Once | SUBCUTANEOUS | Status: AC
  Administered 2012-08-06: 12 [IU] via SUBCUTANEOUS

## 2012-08-06 MED ORDER — DEXTROSE 50 % IV SOLN: 25.0000 mL | INTRAVENOUS | Status: DC | PRN

## 2012-08-06 MED ORDER — DEXTROSE-NACL 5-0.45 % IV SOLN
INTRAVENOUS | Status: DC
  Administered 2012-08-07: 02:00:00 via INTRAVENOUS

## 2012-08-06 MED ORDER — VANCOMYCIN HCL IN DEXTROSE 1-5 GM/200ML-% IV SOLN
1000.0000 mg | Freq: Two times a day (BID) | INTRAVENOUS | Status: DC
  Filled 2012-08-06: qty 200

## 2012-08-06 NOTE — Telephone Encounter (Signed)
Make sure done We have done this twice now

## 2012-08-06 NOTE — Progress Notes (Signed)
INITIAL NUTRITION ASSESSMENT  DOCUMENTATION CODES Per approved criteria  -Not Applicable   INTERVENTION: - Glucerna shakes BID - Encouraged pt to continue to eat well - Will continue to monitor   NUTRITION DIAGNOSIS: Unintended weight loss related to eating less as evidenced by pt report, weight trend   Goal: Pt to consume >90% of meals/supplements  Monitor: Weights, labs, intake  Reason for Assessment: Nutrition risk   42 y.o. male  Admitting Dx: HCAP (healthcare-associated pneumonia)  ASSESSMENT: Pt discussed during multidisciplinary rounds.   Pt recently diagnosed with HIV in April of this year. Met with pt who reports typically eating well however in the past week - his appetite has been down with pt eating 0-2 meals/day. Pt has lost 15 pounds unintentionally in the past 2 months. Pt interested in trying Glucerna shakes for additional protein. Pt reports his appetite returned this morning and that he ate 100% of his breakfast.   Height: Ht Readings from Last 1 Encounters:  08/05/12 5\' 10"  (1.778 m)    Weight: Wt Readings from Last 1 Encounters:  08/06/12 174 lb 6.1 oz (79.1 kg)    Ideal Body Weight: 166 lb  % Ideal Body Weight: 105%  Wt Readings from Last 10 Encounters:  08/06/12 174 lb 6.1 oz (79.1 kg)  08/05/12 170 lb (77.111 kg)  08/03/12 176 lb 3.2 oz (79.924 kg)  07/13/12 173 lb 12.8 oz (78.835 kg)  07/08/12 176 lb 6.4 oz (80.015 kg)  06/18/12 189 lb (85.73 kg)  06/11/12 186 lb (84.369 kg)  06/09/12 183 lb 12.8 oz (83.371 kg)  06/02/12 187 lb (84.823 kg)  05/21/12 182 lb 6.4 oz (82.736 kg)    Usual Body Weight: 189 lb in May 2014  % Usual Body Weight: 92%  BMI:  Body mass index is 25.02 kg/(m^2).  Estimated Nutritional Needs: Kcal: 2000-2400 Protein: 120-140g Fluid: 2-2.4L/day  Skin: Intact   Diet Order: Carb Control  EDUCATION NEEDS: -No education needs identified at this time   Intake/Output Summary (Last 24 hours) at 08/06/12  1307 Last data filed at 08/06/12 1200  Gross per 24 hour  Intake   3475 ml  Output   1900 ml  Net   1575 ml    Last BM: PTA  Labs:   Recent Labs Lab 08/03/12 1623 08/05/12 1630 08/06/12 0341  NA 133* 129* 131*  K 4.9 4.7 4.5  CL 99 93* 97  CO2 24 24 24   BUN 38* 45* 40*  CREATININE 1.92* 1.80* 1.56*  CALCIUM 9.5 9.9 9.3  GLUCOSE 238* 347* 226*    CBG (last 3)   Recent Labs  08/06/12 0409 08/06/12 0734 08/06/12 1143  GLUCAP 224* 292* 482*    Scheduled Meds: . abacavir  600 mg Oral QHS   And  . lamiVUDine  300 mg Oral QHS  . acetaminophen  1,000 mg Oral BID  . antiseptic oral rinse  15 mL Mouth Rinse BID  . aspirin  81 mg Oral q morning - 10a  . ceFEPime (MAXIPIME) IV  1 g Intravenous Q8H  . clindamycin (CLEOCIN) IV  600 mg Intravenous Q8H  . dolutegravir  50 mg Oral QHS  . enoxaparin (LOVENOX) injection  40 mg Subcutaneous Q24H  . ferrous sulfate  650 mg Oral Q breakfast  . furosemide  20 mg Oral BID  . hydrOXYzine  25 mg Oral BID  . levothyroxine  175 mcg Oral QAC breakfast  . losartan  25 mg Oral q morning - 10a  .  methylPREDNISolone (SOLU-MEDROL) injection  40 mg Intravenous Q6H  . metoCLOPramide  5 mg Oral TID AC  . omega-3 acid ethyl esters  2 g Oral BID  . pantoprazole  40 mg Oral Daily  . primaquine  30 mg Oral Daily  . simvastatin  40 mg Oral QHS  . sodium bicarbonate  650 mg Oral q morning - 10a  . [START ON 08/11/2012] sodium polystyrene  30 g Oral Weekly  . testosterone  5 g Transdermal q morning - 10a  . vancomycin  1,000 mg Intravenous Q12H  . venlafaxine XR  112.5 mg Oral q morning - 10a    Continuous Infusions: . sodium chloride 1,000 mL (08/06/12 0225)  . insulin pump      Past Medical History  Diagnosis Date  . IDDM (insulin dependent diabetes mellitus)     38 years  . Retinopathy     x2  . Proteinuria   . Dyslipidemia   . Anemia, iron deficiency On procrit  . Hematuria, microscopic 10/09    work up negative (Dr. Amalia Hailey)   . Hypothyroidism   . CKD (chronic kidney disease)   . Low HDL (under 40)   . 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 Hyperkalemia   . Depression   . Gastroparesis diabeticorum   . Gastroesophageal reflux disease   . Degenerative arthritis   . HIV positive   . CKD (chronic kidney disease) stage 3, GFR 30-59 ml/min   . SIRS (systemic inflammatory response syndrome)   . CAP (community acquired pneumonia)     Past Surgical History  Procedure Laterality Date  . Eye surgery  [phone]    x2   . Vitrectomy  bilateral  . Insulin pump    . Colonoscopy    . Esophagogastroduodenoscopy    . Lasik Bilateral   . Cataract extraction Right      Mikey College MS, RD, LDN [phone] Pager [phone] After Hours Pager

## 2012-08-06 NOTE — Progress Notes (Signed)
CBG 482 - 9.2 units of insulin given via pump

## 2012-08-06 NOTE — Progress Notes (Signed)
PT has order for insulin pump.  His basal rate changed to 1.2 units/HR at 00:00.  Midnight CBG was 195 and per patient he does not receive any coverage.  Basal rate will next change to 1.4 units/HR at 8:00 am

## 2012-08-06 NOTE — Progress Notes (Signed)
ANTIBIOTIC CONSULT NOTE - FOLLOW UP  Pharmacy Consult for Vancomycin Indication: presumed HCAP  Allergies  Allergen Reactions  . Sulfa Antibiotics Other (See Comments)    High potassium  . Ramipril Cough  . Versed (Midazolam) Other (See Comments)    "I don't wake up very good or clear it out of my system"    Patient Measurements: Height: 5\' 10"  (177.8 cm) Weight: 174 lb 6.1 oz (79.1 kg) IBW/kg (Calculated) : 73  Vital Signs: Temp: 98.4 F (36.9 C) (07/03 0800) Temp src: Oral (07/03 0800) BP: 116/67 mmHg (07/03 0800) Pulse Rate: 85 (07/03 0800) Intake/Output from previous day: 07/02 0701 - 07/03 0700 In: 2812.5 [I.V.:2312.5; IV Piggyback:500] Out: 1325 [Urine:1325] Intake/Output from this shift: Total I/O In: 175 [I.V.:125; IV Piggyback:50] Out: -   Labs:  Recent Labs  08/03/12 1623 08/05/12 1630 08/06/12 0341  WBC  --  14.5* 10.8*  HGB  --  8.9* 8.4*  PLT  --  327 312  CREATININE 1.92* 1.80* 1.56*   Estimated Creatinine Clearance: 63 ml/min (by C-G formula based on Cr of 1.56). No results found for this basename: Letta Median, VANCORANDOM, GENTTROUGH, GENTPEAK, GENTRANDOM, TOBRATROUGH, TOBRAPEAK, TOBRARND, AMIKACINPEAK, AMIKACINTROU, AMIKACIN,  in the last 72 hours    Assessment: 54 yom with recently diagnosed HIV/AIDS (05/2012) started on HAART seen at ID clinic 7/2 for c/o fatigue, fever, SOB and was hypoxic required admission to [facility] with concerns for opportunistic infections. Of note, patient visited PCP office 6/30 with R groin wound, prescribed Keflex and OTC Clotrimazole for fungal coverage, culture obtained. One month ago, patient was treated for CAP. CXR in the ED with widespread bilateral bronchopneumonia most pronounced in the lower lobes. CD4 count was 20 on 06/18/12, patient was not on MAC ppx PTA? ID recommended Cefepime/Vanc for HCAP, Clinda and Primaquine for PCP coverage. Patient with CKD stage III with baseline Scr of 1.6 to 1.8.   7/2 >>  Cefepime >>  7/2 >> Vanc x 8d >>  7/2 >> Clindamycin (r/o PCP) >> 7/2 >> Primaquine (r/o PCP) >>  Today is D#2 of Vanc, Cefepime, Clinda, Primaquine. Tmax 100.7, WBC improved to 10.8K, Scr improved to 1.56 for CG CrCl 63 ml/min and normalized CrCl of 62 ml/min. Cultures obtained and pending during this admissions are ...  7/2 Blood x 2 >> pending 7/2 Blood AFB >> pending 7/2 MRSA pcr >> neg 7/2 Sputum >> ordered 7/2 Urine >> pending 7/2 Influenza PCR >> pending 7/2 Strep pneumo/legionella ag >> neg/pending 7/3 HIV-RNA >> pending 7/3 CD4 count >> pending  Per EPIC review, patient had a wound cx on 05/21/12 (not sure exactly where)  that grew MSSA. Pharmacy called [place] office (patient's PCP office) to follow up on the wound cx that was obtained 6/30 but it has not resulted. We'll follow up so wound can be treated appropriately   Goal of Therapy:  Vancomycin trough level 15-20 mcg/ml Appropriate dosing of antibiotics  Plan:   Given improvement in Scr, chang Vancomycin to 1000mg  IV q12h for a total of 8 days therapy  Continue other antibiotics as ordered  Pharmacy will f/u   Vanessa [place], PharmD, BCPS Pager: [phone] 8:59 AM Pharmacy #: [phone]

## 2012-08-06 NOTE — Progress Notes (Signed)
[facility] PROGRESS NOTE  KSHAUN GARGAN [id number] DOB: December 06, 1970 DOA: 08/05/2012 PCP: Redge Gainer, MD  Brief narrative: Patrick Brown is an 42 y.o. male with a PMH of recently diagnosed HIV 05/2012, CD4 31 on 06/19/12, on Tivicay and epzicom, seen in ID clinic 08/05/12 by Dr. Linus Salmons and sent to the ER for further evaluation secondary to hypoxia, fever, malaise and generalized weakness.  He is being treated for both HCAP and PCP pneumonia.  Assessment/Plan: Principal Problem:  HCAP (healthcare-associated pneumonia) with acute hypoxic respiratory failure and hypoxia  -Given immunocompromise and recent hospitalization, he is being covered for both HCAP and PCP.  -Discussed case with Dr. Tommy Medal 08/05/12 who recommended clindamycin and Primaquin for PCP and cefepime/vancomycin for HCAP. ID will see the patient in consultation today.  -Blood cultures/AFB blood cultures requested.  -Strep pneumonia negative, Legionella antigen pending.  - Admitted to the step down unit. On continuous pulse oximetry. Sats 90-95%.  Initially on NRB mask, now on nasal cannula at 7 L/min. -ABG with pO2 of 185, pCO2 38.5, pH 7.378.  -Continue supplemental oxygen as needed to keep oxygen saturations greater than 92%.  -Continue Solu-Medrol 40 mg IV every 6 hours.  Active Problems:  Chronic kidney disease, stage 3, mod decreased GFR  -Baseline creatinine 1.6-1.8. Current creatinine at usual baseline values.  DM (diabetes mellitus), type 1, uncontrolled  -Continue insulin pump. CBGs 195-254. -Continue insulin resistant SSI.  -F/U diabetes coordinator recommendations.  Hypothyroid  -Continue home dose of Synthroid.  Chronic normocytic anemia, iron deficiency  -Continue iron therapy. Also receives Aranesp as an outpatient.  Gastroparesis  -Continue Reglan.  Sinus tachycardia  -Likely from hypoxia. Improved on oxygen. HIV disease  -Continue HIV therapy. Followup CD4 count.  Hyponatremia  -May be  reflective of hyperglycemia or SIADH from lung process. Correct elevated glucose and monitor.   Code Status: Full.  Family Communication: Mother at the bedside.  Disposition Plan: Home when stable.  Medical Consultants:  ID  Other Consultants:  Diabetes coordinator  Anti-infectives:  Cefipime 08/05/12--->  Vancomycin 08/05/12--->  Clindamycin 08/05/12--->  Primaquine 08/05/12--->   HPI/Subjective: [name] says he feels better this morning. He is hungry and is requesting to eat. No fevers overnight. Less dyspneic. Still reports a nonproductive cough. No diarrhea.  Objective: Filed Vitals:   08/06/12 0300 08/06/12 0400 08/06/12 0500 08/06/12 0600  BP:  114/70  111/69  Pulse: 79 81 78 84  Temp:  97.5 F (36.4 C)    TempSrc:  Oral    Resp: 27 22 22 23   Height:      Weight:  79.1 kg (174 lb 6.1 oz)    SpO2: 93% 93% 90% 95%    Intake/Output Summary (Last 24 hours) at 08/06/12 0706 Last data filed at 08/06/12 0617  Gross per 24 hour  Intake   2750 ml  Output   1325 ml  Net   1425 ml    Exam: Gen:  NAD Cardiovascular:  RRR, No M/R/G Respiratory:  Diminished breath sounds bilaterally Gastrointestinal:  Abdomen soft, NT/ND, + BS Extremities:  No C/E/C  Data Reviewed: Basic Metabolic Panel:  Recent Labs Lab 08/03/12 1623 08/05/12 1630 08/06/12 0341  NA 133* 129* 131*  K 4.9 4.7 4.5  CL 99 93* 97  CO2 24 24 24   GLUCOSE 238* 347* 226*  BUN 38* 45* 40*  CREATININE 1.92* 1.80* 1.56*  CALCIUM 9.5 9.9 9.3   GFR Estimated Creatinine Clearance: 63 ml/min (by C-G formula based on  Cr of 1.56). Liver Function Tests:  Recent Labs Lab 08/05/12 1630  AST 13  ALT 9  ALKPHOS 102  BILITOT 0.3  PROT 6.4  ALBUMIN 2.3*   CBC:  Recent Labs Lab 08/05/12 1630 08/06/12 0341  WBC 14.5* 10.8*  NEUTROABS 9.8*  --   HGB 8.9* 8.4*  HCT 28.0* 25.9*  MCV 89.7 88.4  PLT 327 312   BNP (last 3 results)  Recent Labs  04/16/12 2020 04/16/12 2220  PROBNP 66.5  94.3   CBG:  Recent Labs Lab 08/05/12 2107 08/06/12 0019 08/06/12 0409  GLUCAP 254* 195* 224*   Microbiology Recent Results (from the past 240 hour(s))  MRSA PCR SCREENING     Status: None   Collection Time    08/05/12  6:48 PM      Result Value Range Status   MRSA by PCR NEGATIVE  NEGATIVE Final   Comment:            The GeneXpert MRSA Assay (FDA     approved for NASAL specimens     only), is one component of a     comprehensive MRSA colonization     surveillance program. It is not     intended to diagnose MRSA     infection nor to guide or     monitor treatment for     MRSA infections.     Procedures and Diagnostic Studies: Dg Chest 2 View  08/05/2012   *RADIOLOGY REPORT*  Clinical Data: Shortness of breath.  Poor oxygen saturation.  CHEST - 2 VIEW  Comparison: 07/08/2012 and multiple previous  Findings: Artifact overlies chest.  Heart size is normal.  There are patchy infiltrates throughout both lungs, most pronounced at the lung bases, consistent with pneumonia.  No effusions.  Old compression deformities are seen in the upper thoracic region.  IMPRESSION: Widespread bilateral bronchopneumonia most pronounced in the lower lobes.   Original Report Authenticated By: Nelson Chimes, M.D.    Scheduled Meds: . abacavir  600 mg Oral QHS   And  . lamiVUDine  300 mg Oral QHS  . acetaminophen  1,000 mg Oral BID  . antiseptic oral rinse  15 mL Mouth Rinse BID  . aspirin  81 mg Oral q morning - 10a  . ceFEPime (MAXIPIME) IV  1 g Intravenous Q8H  . clindamycin (CLEOCIN) IV  600 mg Intravenous Q8H  . dolutegravir  50 mg Oral QHS  . enoxaparin (LOVENOX) injection  40 mg Subcutaneous Q24H  . ferrous sulfate  650 mg Oral Q breakfast  . furosemide  20 mg Oral BID  . hydrOXYzine  25 mg Oral BID  . levothyroxine  175 mcg Oral QAC breakfast  . losartan  25 mg Oral q morning - 10a  . methylPREDNISolone (SOLU-MEDROL) injection  40 mg Intravenous Q6H  . metoCLOPramide  5 mg Oral TID AC   . omega-3 acid ethyl esters  2 g Oral BID  . pantoprazole  40 mg Oral Daily  . primaquine  30 mg Oral Daily  . simvastatin  40 mg Oral QHS  . sodium bicarbonate  650 mg Oral q morning - 10a  . [START ON 08/11/2012] sodium polystyrene  30 g Oral Weekly  . testosterone  5 g Transdermal q morning - 10a  . vancomycin  750 mg Intravenous Q12H  . venlafaxine XR  112.5 mg Oral q morning - 10a   Continuous Infusions: . sodium chloride 1,000 mL (08/06/12 0225)  . insulin pump  Time spent: 35 minutes with greater than 50% of time spent counseling the patient and his mother on his test results, diagnostic impression, and plan of care.   LOS: 1 day   Lawren Sexson  [facility] Pager [phone].   *Please note that the hospitalists switch teams on Wednesdays. Please call the flow manager at [phone] if you are having difficulty reaching the hospitalist taking care of this patient as she can update you and provide the most up-to-date pager number of provider caring for the patient. If 8PM-8AM, please contact night-coverage at [email or web address], password [facility]  08/06/2012, 7:06 AM

## 2012-08-06 NOTE — Progress Notes (Signed)
Inpatient Diabetes Program Recommendations  AACE/ADA: New Consensus Statement on Inpatient Glycemic Control (2013)  Target Ranges:  Prepandial:   less than 140 mg/dL      Peak postprandial:   less than 180 mg/dL (1-2 hours)      Critically ill patients:  140 - 180 mg/dL     42 y.o. male with a PMH of recently diagnosed HIV 05/2012, CD4 20 on 06/19/12, on Tivicay and epzicom, seen in ID clinic 08/05/12 by Dr. Linus Salmons and sent to the ER for further evaluation secondary to hypoxia, fever, malaise and generalized weakness. He is being treated for both HCAP and PCP pneumonia.    Uses insulin pump at home. Has had Type 1 diabetes since he was age 62 years. Marland Kitchen  Upon assessment, patient A&O x4 and able to independently operate insulin pump. Appropriate for pump therapy in hospital. Had patient sign insulin pump contract. Gave patient a copy of the insulin pump flow sheet and explained to him how to use it. Patient agrees to allow RN to check his CBGs with hospital meter.   Pt states "if y'all try to make me take off my pump, then I will leave." Discussed insulin pump policy with pt and possible need for GlucoStabilizer.  Pt adament about not taking off insulin pump.  Patient's PCP is Dr. Laurance Flatten (in [place], [place]). Gets help from Tessie Fass, PharmD, CDE in managing insulin pump as well.   Current basal settings are as follows:   Midnight-8am 1.2 units/hr  8am-5pm 1.4 units/hr  5pm-Midnight 1.6 units/hr  Total basal: 33.4 units/day CF - 35 CHO ratio - 1:7 Patient due to change set/site tomorrow (08/07/2012)  On SoluMedrol 40 Q6.  Results for UZZIAH, HUSER (MRN [id number]) as of 08/06/2012 16:22  Ref. Range 08/06/2012 00:19 08/06/2012 04:09 08/06/2012 07:34 08/06/2012 11:43 08/06/2012 16:02  Glucose-Capillary Latest Range: 70-99 mg/dL 195 (H) 224 (H) 292 (H) 482 (H) 541 (H)   Awaiting BMET.  Recommendations:  Novolog 12 units now (1X dose) Novolog sensitive Q4 hours. D/C when CBG <250 mg/dL. Continue with  basal/bolus rates on insulin pump.  Discussed with Dr. Rockne Menghini and Darol Destine, RN.  Thank you. [name], RD, LDN, CDE Inpatient Diabetes Coordinator [phone]

## 2012-08-06 NOTE — Consult Note (Signed)
[facility]    Date of Admission:  08/05/2012  Date of Consult:  08/06/2012  Reason for Consult: PCP vs HCAP in pt with HIV/AIDS Referring Physician: Dr. Rockne Menghini   HPI: Patrick Brown is an 42 y.o. male with recently diagnosed HIV/AIDS, recently started on Tivicay and Epzicom who has also had comorbid problems of IDDM (insulin pump), CKD, HyperK due in part to excretory defect, with admission for severe hyperkalemis due to TMP, ACE but still on an ARB???, who was admitted to [facility]  after presenting to [facility] with worsening fatigue, fevers and found to be severely hypoxic. He also apparently had a groin wound that I did not myself examined today. On admission he was found to be profoundly hypoxic and CXR showed bilateraly multilobar infiltrates. He has had dry cough and LDH is >500. He has been taking PCP prophylaxis with dapsone (due to his intolerance of TMP and hyperK). He was admitted and started on PCP rx sith primaquin and clindamycin along with IV steroids, and IV vancomycin, cefepime for possible HCAP.  He feels betterovernight though still on 6L O2 via [place].   Past Medical History  Diagnosis Date  . IDDM (insulin dependent diabetes mellitus)     38 years  . Retinopathy     x2  . Proteinuria   . Dyslipidemia   . Anemia, iron deficiency On procrit  . Hematuria, microscopic 10/09    work up negative (Dr. Amalia Hailey)  . Hypothyroidism   . CKD (chronic kidney disease)   . Low HDL (under 40)   . 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 Hyperkalemia   . Depression   . Gastroparesis diabeticorum   . Gastroesophageal reflux disease   . Degenerative arthritis   . HIV positive   . CKD (chronic kidney disease) stage 3, GFR 30-59  ml/min   . SIRS (systemic inflammatory response syndrome)   . CAP (community acquired pneumonia)     Past Surgical History  Procedure Laterality Date  . Eye surgery  [id number]    x2   . Vitrectomy  bilateral  . Insulin pump    . Colonoscopy    . Esophagogastroduodenoscopy    . Lasik Bilateral   . Cataract extraction Right   ergies:   Allergies  Allergen Reactions  . Sulfa Antibiotics Other (See Comments)    High potassium  . Ramipril Cough  . Versed (Midazolam) Other (See Comments)    "I don't wake up very good or clear it out of my system"     Medications: I have reviewed patients current medications as documented in Epic Anti-infectives   Start     Dose/Rate Route Frequency Ordered Stop   08/06/12 1800  vancomycin (VANCOCIN) IVPB 1000 mg/200 mL premix  Status:  Discontinued     1,000 mg 200 mL/hr over 60 Minutes Intravenous Every 12 hours 08/06/12 0900 08/06/12 1556   08/06/12 1000  dapsone tablet 100 mg  Status:  Discontinued     100 mg Oral  Every morning - 10a 08/05/12 1834 08/05/12 1919   08/05/12 2200  abacavir-lamiVUDine (EPZICOM) 600-300 MG per tablet 1 tablet  Status:  Discontinued     1 tablet Oral Daily at bedtime 08/05/12 1834 08/05/12 1902   08/05/12 2200  dolutegravir (TIVICAY)  tablet 50 mg     50 mg Oral Daily at bedtime 08/05/12 1834     08/05/12 2200  ceFEPIme (MAXIPIME) 1 g in dextrose 5 % 50 mL IVPB  Status:  Discontinued     1 g 100 mL/hr over 30 Minutes Intravenous 3 times per day 08/05/12 1834 08/05/12 1848   08/05/12 2200  abacavir (ZIAGEN) tablet 600 mg     600 mg Oral Daily at bedtime 08/05/12 1904     08/05/12 2200  lamiVUDine (EPIVIR) tablet 300 mg     300 mg Oral Daily at bedtime 08/05/12 1904     08/05/12 2000  clindamycin (CLEOCIN) IVPB 600 mg     600 mg 100 mL/hr over 30 Minutes Intravenous Every 8 hours 08/05/12 1834     08/05/12 2000  primaquine tablet 30 mg     30 mg Oral Daily 08/05/12 1834     08/05/12 1800  vancomycin (VANCOCIN)  IVPB 750 mg/150 ml premix  Status:  Discontinued     750 mg 150 mL/hr over 60 Minutes Intravenous Every 12 hours 08/05/12 1635 08/06/12 0900   08/05/12 1630  ceFEPIme (MAXIPIME) 1 g in dextrose 5 % 50 mL IVPB  Status:  Discontinued     1 g 100 mL/hr over 30 Minutes Intravenous Every 8 hours 08/05/12 1602 08/06/12 1556   08/05/12 1600  cefTRIAXone (ROCEPHIN) 1 g in dextrose 5 % 50 mL IVPB  Status:  Discontinued     1 g 100 mL/hr over 30 Minutes Intravenous  Once 08/05/12 1558 08/05/12 1559   08/05/12 1600  azithromycin (ZITHROMAX) 500 mg in dextrose 5 % 250 mL IVPB  Status:  Discontinued     500 mg 250 mL/hr over 60 Minutes Intravenous  Once 08/05/12 1558 08/05/12 1559      Social History:  reports that he has never smoked. He has never used smokeless tobacco. He reports that he does not drink alcohol or use illicit drugs.  Family History  Problem Relation Age of Onset  . Diabetes type I Brother   . Prostate cancer Other   . Dementia Other   . Dementia Other   . Dementia Other     As in HPI and primary teams notes otherwise 12 point review of systems is negative  Blood pressure 121/60, pulse 93, temperature 98.1 F (36.7 C), temperature source Oral, resp. rate 25, height 5\' 10"  (1.778 m), weight 174 lb 6.1 oz (79.1 kg), SpO2 92.00%. General: Alert and awake, oriented x3, not in any acute distress. HEENT: anicteric sclera, pupils reactive to light and accommodation, EOMI, oropharynx clear and without exudate CVS regular rate, normal r,  no murmur rubs or gallops Chest:course breath sounds throughout with prolonged expiratory phase Abdomen: soft nontender, nondistended, normal bowel sounds, Extremities: no  clubbing or edema noted bilaterally Skin: no rashes Neuro: nonfocal, strength and sensation intact   Results for orders placed during the hospital encounter of 08/05/12 (from the past 48 hour(s))  CULTURE, BLOOD (ROUTINE X 2)     Status: None   Collection Time    08/05/12   3:56 PM      Result Value Range   Specimen Description BLOOD RIGHT ARM     Special Requests BOTTLES DRAWN AEROBIC AND ANAEROBIC 6CC     Culture  Setup Time 08/05/2012 21:02     Culture       Value:        BLOOD CULTURE RECEIVED NO GROWTH TO DATE CULTURE WILL BE  HELD FOR 5 DAYS BEFORE ISSUING A FINAL NEGATIVE REPORT   Report Status PENDING    CULTURE, BLOOD (ROUTINE X 2)     Status: None   Collection Time    08/05/12  4:30 PM      Result Value Range   Specimen Description BLOOD LEFT ARM     Special Requests BOTTLES DRAWN AEROBIC AND ANAEROBIC 5CC     Culture  Setup Time 08/05/2012 21:03     Culture       Value:        BLOOD CULTURE RECEIVED NO GROWTH TO DATE CULTURE WILL BE HELD FOR 5 DAYS BEFORE ISSUING A FINAL NEGATIVE REPORT   Report Status PENDING    CBC WITH DIFFERENTIAL     Status: Abnormal   Collection Time    08/05/12  4:30 PM      Result Value Range   WBC 14.5 (*) 4.0 - 10.5 K/uL   RBC 3.12 (*) 4.22 - 5.81 MIL/uL   Hemoglobin 8.9 (*) 13.0 - 17.0 g/dL   HCT 28.0 (*) 39.0 - 52.0 %   MCV 89.7  78.0 - 100.0 fL   MCH 28.5  26.0 - 34.0 pg   MCHC 31.8  30.0 - 36.0 g/dL   RDW 15.2  11.5 - 15.5 %   Platelets 327  150 - 400 K/uL   Neutrophils Relative % 68  43 - 77 %   Neutro Abs 9.8 (*) 1.7 - 7.7 K/uL   Lymphocytes Relative 14  12 - 46 %   Lymphs Abs 2.0  0.7 - 4.0 K/uL   Monocytes Relative 12  3 - 12 %   Monocytes Absolute 1.7 (*) 0.1 - 1.0 K/uL   Eosinophils Relative 6 (*) 0 - 5 %   Eosinophils Absolute 0.9 (*) 0.0 - 0.7 K/uL   Basophils Relative 0  0 - 1 %   Basophils Absolute 0.1  0.0 - 0.1 K/uL  COMPREHENSIVE METABOLIC PANEL     Status: Abnormal   Collection Time    08/05/12  4:30 PM      Result Value Range   Sodium 129 (*) 135 - 145 mEq/L   Potassium 4.7  3.5 - 5.1 mEq/L   Chloride 93 (*) 96 - 112 mEq/L   CO2 24  19 - 32 mEq/L   Glucose, Bld 347 (*) 70 - 99 mg/dL   BUN 45 (*) 6 - 23 mg/dL   Creatinine, Ser 1.80 (*) 0.50 - 1.35 mg/dL   Calcium 9.9  8.4 - 10.5  mg/dL   Total Protein 6.4  6.0 - 8.3 g/dL   Albumin 2.3 (*) 3.5 - 5.2 g/dL   AST 13  0 - 37 U/L   ALT 9  0 - 53 U/L   Alkaline Phosphatase 102  39 - 117 U/L   Total Bilirubin 0.3  0.3 - 1.2 mg/dL   GFR calc non Af Amer 44 (*) >90 mL/min   GFR calc Af Amer 52 (*) >90 mL/min   Comment:            The eGFR has been calculated     using the CKD EPI equation.     This calculation has not been     validated in all clinical     situations.     eGFR's persistently     <90 mL/min signify     possible Chronic Kidney Disease.  LACTATE DEHYDROGENASE     Status: Abnormal   Collection Time  08/05/12  4:30 PM      Result Value Range   LDH 529 (*) 94 - 250 U/L  CG4 I-STAT (LACTIC ACID)     Status: None   Collection Time    08/05/12  4:39 PM      Result Value Range   Lactic Acid, Venous 1.33  0.5 - 2.2 mmol/L  URINALYSIS, ROUTINE W REFLEX MICROSCOPIC     Status: Abnormal   Collection Time    08/05/12  6:00 PM      Result Value Range   Color, Urine AMBER (*) YELLOW   Comment: BIOCHEMICALS MAY BE AFFECTED BY COLOR   APPearance CLEAR  CLEAR   Specific Gravity, Urine 1.024  1.005 - 1.030   pH 5.0  5.0 - 8.0   Glucose, UA 250 (*) NEGATIVE mg/dL   Hgb urine dipstick NEGATIVE  NEGATIVE   Bilirubin Urine SMALL (*) NEGATIVE   Ketones, ur NEGATIVE  NEGATIVE mg/dL   Protein, ur NEGATIVE  NEGATIVE mg/dL   Urobilinogen, UA 1.0  0.0 - 1.0 mg/dL   Nitrite NEGATIVE  NEGATIVE   Leukocytes, UA NEGATIVE  NEGATIVE   Comment: MICROSCOPIC NOT DONE ON URINES WITH NEGATIVE PROTEIN, BLOOD, LEUKOCYTES, NITRITE, OR GLUCOSE <1000 mg/dL.  MRSA PCR SCREENING     Status: None   Collection Time    08/05/12  6:48 PM      Result Value Range   MRSA by PCR NEGATIVE  NEGATIVE   Comment:            The GeneXpert MRSA Assay (FDA     approved for NASAL specimens     only), is one component of a     comprehensive MRSA colonization     surveillance program. It is not     intended to diagnose MRSA     infection nor  to guide or     monitor treatment for     MRSA infections.  BLOOD GAS, ARTERIAL     Status: Abnormal   Collection Time    08/05/12  6:55 PM      Result Value Range   O2 Content 15.0     Delivery systems NON-REBREATHER OXYGEN MASK     pH, Arterial 7.378  7.350 - 7.450   pCO2 arterial 38.5  35.0 - 45.0 mmHg   pO2, Arterial 185.0 (*) 80.0 - 100.0 mmHg   Bicarbonate 22.1  20.0 - 24.0 mEq/L   TCO2 21.1  0 - 100 mmol/L   Acid-base deficit 2.2 (*) 0.0 - 2.0 mmol/L   O2 Saturation 97.1     Patient temperature 37.0     Collection site RIGHT RADIAL     Drawn by [phone]     Sample type ARTERIAL DRAW     Allens test (pass/fail) PASS  PASS  AFB CULTURE, BLOOD     Status: None   Collection Time    08/05/12  7:54 PM      Result Value Range   Specimen Description BLOOD RIGHT ARM     Special Requests BOTTLES DRAWN AEROBIC AND ANAEROBIC 5ML     Culture       Value: CULTURE WILL BE EXAMINED FOR 6 WEEKS BEFORE ISSUING A FINAL REPORT   Report Status PENDING    GLUCOSE, CAPILLARY     Status: Abnormal   Collection Time    08/05/12  9:07 PM      Result Value Range   Glucose-Capillary 254 (*) 70 - 99 mg/dL  LEGIONELLA ANTIGEN,  URINE     Status: None   Collection Time    08/05/12  9:18 PM      Result Value Range   Specimen Description URINE, CLEAN CATCH     Special Requests NONE     Legionella Antigen, Urine Negative for Legionella pneumophilia serogroup 1     Report Status 08/06/2012 FINAL    STREP PNEUMONIAE URINARY ANTIGEN     Status: None   Collection Time    08/05/12  9:18 PM      Result Value Range   Strep Pneumo Urinary Antigen NEGATIVE  NEGATIVE   Comment: Performed at [facility]                Infection due to S. pneumoniae     cannot be absolutely ruled out     since the antigen present     may be below the detection limit     of the test.  INFLUENZA PANEL BY PCR     Status: None   Collection Time    08/05/12  9:30 PM      Result Value Range   Influenza A By PCR  NEGATIVE  NEGATIVE   Influenza B By PCR NEGATIVE  NEGATIVE   H1N1 flu by pcr NOT DETECTED  NOT DETECTED   Comment:            The Xpert Flu assay (FDA approved for     nasal aspirates or washes and     nasopharyngeal swab specimens), is     intended as an aid in the diagnosis of     influenza and should not be used as     a sole basis for treatment.  GLUCOSE, CAPILLARY     Status: Abnormal   Collection Time    08/06/12 12:19 AM      Result Value Range   Glucose-Capillary 195 (*) 70 - 99 mg/dL   Comment 1 Documented in Chart     Comment 2 Notify RN    T-HELPER CELLS (CD4) COUNT     Status: Abnormal   Collection Time    08/06/12  3:41 AM      Result Value Range   CD4 T Cell Abs 70 (*) 400 - 2700 cmm   CD4 % Helper T Cell 4 (*) 33 - 55 %  BASIC METABOLIC PANEL     Status: Abnormal   Collection Time    08/06/12  3:41 AM      Result Value Range   Sodium 131 (*) 135 - 145 mEq/L   Potassium 4.5  3.5 - 5.1 mEq/L   Chloride 97  96 - 112 mEq/L   CO2 24  19 - 32 mEq/L   Glucose, Bld 226 (*) 70 - 99 mg/dL   BUN 40 (*) 6 - 23 mg/dL   Creatinine, Ser 1.56 (*) 0.50 - 1.35 mg/dL   Calcium 9.3  8.4 - 10.5 mg/dL   GFR calc non Af Amer 53 (*) >90 mL/min   GFR calc Af Amer 61 (*) >90 mL/min   Comment:            The eGFR has been calculated     using the CKD EPI equation.     This calculation has not been     validated in all clinical     situations.     eGFR's persistently     <90 mL/min signify     possible Chronic Kidney Disease.  CBC  Status: Abnormal   Collection Time    08/06/12  3:41 AM      Result Value Range   WBC 10.8 (*) 4.0 - 10.5 K/uL   RBC 2.93 (*) 4.22 - 5.81 MIL/uL   Hemoglobin 8.4 (*) 13.0 - 17.0 g/dL   HCT 25.9 (*) 39.0 - 52.0 %   MCV 88.4  78.0 - 100.0 fL   MCH 28.7  26.0 - 34.0 pg   MCHC 32.4  30.0 - 36.0 g/dL   RDW 15.1  11.5 - 15.5 %   Platelets 312  150 - 400 K/uL  GLUCOSE, CAPILLARY     Status: Abnormal   Collection Time    08/06/12  4:09 AM       Result Value Range   Glucose-Capillary 224 (*) 70 - 99 mg/dL   Comment 1 Documented in Chart     Comment 2 Notify RN    GLUCOSE, CAPILLARY     Status: Abnormal   Collection Time    08/06/12  7:34 AM      Result Value Range   Glucose-Capillary 292 (*) 70 - 99 mg/dL  GLUCOSE, CAPILLARY     Status: Abnormal   Collection Time    08/06/12 11:43 AM      Result Value Range   Glucose-Capillary 482 (*) 70 - 99 mg/dL   Comment 1 Documented in Chart     Comment 2 Notify RN    HEMOGLOBIN AND HEMATOCRIT, BLOOD     Status: Abnormal   Collection Time    08/06/12 12:21 PM      Result Value Range   Hemoglobin 8.6 (*) 13.0 - 17.0 g/dL   HCT 27.2 (*) 39.0 - 52.0 %  GLUCOSE, CAPILLARY     Status: Abnormal   Collection Time    08/06/12  4:02 PM      Result Value Range   Glucose-Capillary 541 (*) 70 - 99 mg/dL   Comment 1 Notify RN    GLUCOSE, CAPILLARY     Status: Abnormal   Collection Time    08/06/12  4:54 PM      Result Value Range   Glucose-Capillary 515 (*) 70 - 99 mg/dL      Component Value Date/Time   SDES URINE, CLEAN CATCH 08/05/2012 2118   SPECREQUEST NONE 08/05/2012 2118   CULT CULTURE WILL BE EXAMINED FOR 6 WEEKS BEFORE ISSUING A FINAL REPORT 08/05/2012 1954   REPTSTATUS 08/06/2012 FINAL 08/05/2012 2118   Dg Chest 2 View  08/05/2012   *RADIOLOGY REPORT*  Clinical Data: Shortness of breath.  Poor oxygen saturation.  CHEST - 2 VIEW  Comparison: 07/08/2012 and multiple previous  Findings: Artifact overlies chest.  Heart size is normal.  There are patchy infiltrates throughout both lungs, most pronounced at the lung bases, consistent with pneumonia.  No effusions.  Old compression deformities are seen in the upper thoracic region.  IMPRESSION: Widespread bilateral bronchopneumonia most pronounced in the lower lobes.   Original Report Authenticated By: Nelson Chimes, M.D.     Recent Results (from the past 720 hour(s))  CULTURE, BLOOD (ROUTINE X 2)     Status: None   Collection Time     08/05/12  3:56 PM      Result Value Range Status   Specimen Description BLOOD RIGHT ARM   Final   Special Requests BOTTLES DRAWN AEROBIC AND ANAEROBIC [facility]   Final   Culture  Setup Time 08/05/2012 21:02   Final   Culture  Final   Value:        BLOOD CULTURE RECEIVED NO GROWTH TO DATE CULTURE WILL BE HELD FOR 5 DAYS BEFORE ISSUING A FINAL NEGATIVE REPORT   Report Status PENDING   Incomplete  CULTURE, BLOOD (ROUTINE X 2)     Status: None   Collection Time    08/05/12  4:30 PM      Result Value Range Status   Specimen Description BLOOD LEFT ARM   Final   Special Requests BOTTLES DRAWN AEROBIC AND ANAEROBIC 5CC   Final   Culture  Setup Time 08/05/2012 21:03   Final   Culture     Final   Value:        BLOOD CULTURE RECEIVED NO GROWTH TO DATE CULTURE WILL BE HELD FOR 5 DAYS BEFORE ISSUING A FINAL NEGATIVE REPORT   Report Status PENDING   Incomplete  MRSA PCR SCREENING     Status: None   Collection Time    08/05/12  6:48 PM      Result Value Range Status   MRSA by PCR NEGATIVE  NEGATIVE Final   Comment:            The GeneXpert MRSA Assay (FDA     approved for NASAL specimens     only), is one component of a     comprehensive MRSA colonization     surveillance program. It is not     intended to diagnose MRSA     infection nor to guide or     monitor treatment for     MRSA infections.  AFB CULTURE, BLOOD     Status: None   Collection Time    08/05/12  7:54 PM      Result Value Range Status   Specimen Description BLOOD RIGHT ARM   Final   Special Requests BOTTLES DRAWN AEROBIC AND ANAEROBIC 5ML   Final   Culture     Final   Value: CULTURE WILL BE EXAMINED FOR 6 WEEKS BEFORE ISSUING A FINAL REPORT   Report Status PENDING   Incomplete     Impression/Recommendation   42 year old with HIV/AIDS, admitted with what clinically seems HIGHLY consistent with PCP pneumonia  #1 PCP Pneuonia --dc cefepime, vancomycin --continue clinda/primaquin + IV steroids --O2  #2 HIV/AIDS:  continue Tivicay/Epzicom  #3 Hyperkalemia: I dc'd his ARB and I DO NOT SEE WHY he should be on this with hx of hyperK requiring lasix and weekly kayex. I also dc'd lasix for now to ensure he is being hydrated well  Thank you so much for this interesting consult  [place] for [place] [phone] (pager) [phone] (office) 08/06/2012, 5:23 PM  Rhina [place] 08/06/2012, 5:23 PM

## 2012-08-06 NOTE — Progress Notes (Signed)
Inpatient Diabetes Program Recommendations  AACE/ADA: New Consensus Statement on Inpatient Glycemic Control (2013)  Target Ranges:  Prepandial:   less than 140 mg/dL      Peak postprandial:   less than 180 mg/dL (1-2 hours)      Critically ill patients:  140 - 180 mg/dL   Reason for Visit: F/U Insulin Pump  Results for CHEYTON, ENNEN (MRN [id number]) as of 08/06/2012 18:54  Ref. Range 08/06/2012 16:02 08/06/2012 16:54 08/06/2012 17:53  Glucose-Capillary Latest Range: 70-99 mg/dL 541 (H) 515 (H) 551 (HH)  Results for KOURTNEY, HAMBERG (MRN [id number]) as of 08/06/2012 18:54  Ref. Range 08/06/2012 16:13  Sodium Latest Range: 135-145 mEq/L 131 (L)  Potassium Latest Range: 3.5-5.1 mEq/L 5.5 (H)  Chloride Latest Range: 96-112 mEq/L 96  CO2 Latest Range: 19-32 mEq/L 22  BUN Latest Range: 6-23 mg/dL 50 (H)  Creatinine Latest Range: 0.50-1.35 mg/dL 1.58 (H)  Calcium Latest Range: 8.4-10.5 mg/dL 9.4  GFR calc non Af Amer Latest Range: >90 mL/min 52 (L)  GFR calc Af Amer Latest Range: >90 mL/min 60 (L)  Glucose Latest Range: 70-99 mg/dL 570 (HH)   Recommend IV Insulin/GlucoStabilizer to begin.  Pt now agreeable to disconnect insulin pump.   Have discussed with MD, RN and Diabetes Coordinator on call.  See orders for IV insulin.  Thank you. [name], RD, LDN, CDE Inpatient Diabetes Coordinator [phone]

## 2012-08-06 NOTE — Progress Notes (Signed)
CBG 224. Pt gave himself 2.4 units per insulin pump. Decreased FIO2 to 7L per patient request

## 2012-08-06 NOTE — Progress Notes (Signed)
CBG 292 - pt gave self 3.7 units via pump

## 2012-08-07 DIAGNOSIS — L98499 Non-pressure chronic ulcer of skin of other sites with unspecified severity: Secondary | ICD-10-CM

## 2012-08-07 DIAGNOSIS — E875 Hyperkalemia: Secondary | ICD-10-CM | POA: Diagnosis not present

## 2012-08-07 LAB — CBC
MCH: 29.2 pg (ref 26.0–34.0)
MCHC: 33.3 g/dL (ref 30.0–36.0)
Platelets: 354 10*3/uL (ref 150–400)
RBC: 2.81 MIL/uL — ABNORMAL LOW (ref 4.22–5.81)
RDW: 14.9 % (ref 11.5–15.5)

## 2012-08-07 LAB — BASIC METABOLIC PANEL
BUN: 51 mg/dL — ABNORMAL HIGH (ref 6–23)
CO2: 23 mEq/L (ref 19–32)
Calcium: 9.9 mg/dL (ref 8.4–10.5)
Calcium: 9.7 mg/dL (ref 8.4–10.5)
Calcium: 9.8 mg/dL (ref 8.4–10.5)
Creatinine, Ser: 1.54 mg/dL — ABNORMAL HIGH (ref 0.50–1.35)
Creatinine, Ser: 1.44 mg/dL — ABNORMAL HIGH (ref 0.50–1.35)
GFR calc Af Amer: 59 mL/min — ABNORMAL LOW (ref >90)
GFR calc Af Amer: 62 mL/min — ABNORMAL LOW (ref >90)
GFR calc Af Amer: 67 mL/min — ABNORMAL LOW (ref >90)
GFR calc Af Amer: 65 mL/min — ABNORMAL LOW (ref >90)
GFR calc non Af Amer: 50 mL/min — ABNORMAL LOW (ref >90)
GFR calc non Af Amer: 54 mL/min — ABNORMAL LOW (ref >90)
GFR calc non Af Amer: 56 mL/min — ABNORMAL LOW (ref >90)
Glucose, Bld: 255 mg/dL — ABNORMAL HIGH (ref 70–99)
Potassium: 4 mEq/L (ref 3.5–5.1)
Potassium: 3.7 mEq/L (ref 3.5–5.1)
Sodium: 133 mEq/L — ABNORMAL LOW (ref 135–145)
Sodium: 131 mEq/L — ABNORMAL LOW (ref 135–145)

## 2012-08-07 LAB — GLUCOSE, CAPILLARY
Glucose-Capillary: 536 mg/dL — ABNORMAL HIGH (ref 70–99)
Glucose-Capillary: 463 mg/dL — ABNORMAL HIGH (ref 70–99)
Glucose-Capillary: 184 mg/dL — ABNORMAL HIGH (ref 70–99)
Glucose-Capillary: 168 mg/dL — ABNORMAL HIGH (ref 70–99)
Glucose-Capillary: 136 mg/dL — ABNORMAL HIGH (ref 70–99)
Glucose-Capillary: 146 mg/dL — ABNORMAL HIGH (ref 70–99)
Glucose-Capillary: 236 mg/dL — ABNORMAL HIGH (ref 70–99)
Glucose-Capillary: 294 mg/dL — ABNORMAL HIGH (ref 70–99)
Glucose-Capillary: 306 mg/dL — ABNORMAL HIGH (ref 70–99)
Glucose-Capillary: 265 mg/dL — ABNORMAL HIGH (ref 70–99)
Glucose-Capillary: 232 mg/dL — ABNORMAL HIGH (ref 70–99)
Glucose-Capillary: 213 mg/dL — ABNORMAL HIGH (ref 70–99)

## 2012-08-07 LAB — URINE CULTURE
Colony Count: NO GROWTH
Culture: NO GROWTH

## 2012-08-07 MED ORDER — HYDROXYZINE HCL 25 MG PO TABS
25.0000 mg | ORAL_TABLET | Freq: Four times a day (QID) | ORAL | Status: DC | PRN
  Administered 2012-08-10 – 2012-08-12 (×4): 25 mg via ORAL
  Filled 2012-08-07 (×3): qty 1

## 2012-08-07 MED ORDER — SODIUM CHLORIDE 0.9 % IV SOLN
INTRAVENOUS | Status: DC
  Administered 2012-08-07: 13:00:00 via INTRAVENOUS
  Administered 2012-08-08: 100 mL/h via INTRAVENOUS
  Administered 2012-08-09 – 2012-08-10 (×3): via INTRAVENOUS
  Administered 2012-08-10: 1000 mL via INTRAVENOUS
  Administered 2012-08-11: 05:00:00 via INTRAVENOUS

## 2012-08-07 MED ORDER — DIPHENHYDRAMINE-ZINC ACETATE 2-0.1 % EX CREA
TOPICAL_CREAM | Freq: Three times a day (TID) | CUTANEOUS | Status: DC | PRN
  Administered 2012-08-07: 10:00:00 via TOPICAL
  Filled 2012-08-07: qty 28

## 2012-08-07 MED ORDER — LORATADINE 10 MG PO TABS
10.0000 mg | ORAL_TABLET | Freq: Every day | ORAL | Status: DC
  Administered 2012-08-07 – 2012-08-12 (×6): 10 mg via ORAL
  Filled 2012-08-07 (×6): qty 1

## 2012-08-07 NOTE — Progress Notes (Signed)
[facility] PROGRESS NOTE  Patrick Brown [id number] DOB: 1970-08-21 DOA: 08/05/2012 PCP: Redge Gainer, MD  Brief narrative: Patrick Brown is an 42 y.o. male with a PMH of recently diagnosed HIV 05/2012, CD4 27 on 06/19/12, on Tivicay and epzicom, seen in ID clinic 08/05/12 by Dr. Linus Salmons and sent to the ER for further evaluation secondary to hypoxia, fever, malaise and generalized weakness.  He is being treated for PCP pneumonia.  Assessment/Plan: Principal Problem:  Sepsis secondary to HCAP (healthcare-associated pneumonia) with acute hypoxic respiratory failure and hypoxia  -Given immunocompromise and recent hospitalization, he was initially covered for both HCAP and PCP.  -Discussed case with Dr. Tommy Medal 08/05/12 who recommended clindamycin and Primaquin for PCP and cefepime/vancomycin for HCAP. Dr. Tommy Medal evaluated the patient on 08/07/12 and discontinued Cefepime and Vancomycin, as presentation most consistent with PCP.  -F/U Blood cultures/AFB blood cultures.  -Strep pneumonia andLegionella antigens negative.  -Will transfer out of step down unit. Sats 92-94%.  Initially on NRB mask, now on nasal cannula at 7 L/min. -Continue supplemental oxygen as needed to keep oxygen saturations greater than 92%.  -Continue Solu-Medrol 40 mg IV every 6 hours.  Active Problems: Hyperkalemia -ARB d/c'd.  Chronic kidney disease, stage 3, mod decreased GFR  -Baseline creatinine 1.6-1.8. Current creatinine at usual baseline values.  DM (diabetes mellitus), type 1, uncontrolled  -Given marked elevation of blood glucoses, insulin pump d/c'd and the patient was placed on an insulin drip per glucommander protocol. -No evidence of DKA.  CBGs now 119-188. -Will resume insulin pump and add insulin resistant SSI Q 4 hours. Hypothyroid  -Continue home dose of Synthroid.  Chronic normocytic anemia, iron deficiency  -Continue iron therapy. Also receives Aranesp as an outpatient.  Gastroparesis  -Continue  Reglan.  Sinus tachycardia  -Likely from hypoxia. Improved on oxygen. HIV disease  -Continue HIV therapy. CD4 count increased to 70 (from 20 06/19/12).  Hyponatremia  -May be reflective of hyperglycemia or SIADH from lung process. Improving with correction of blood glucoses.   Code Status: Full.  Family Communication: No family currently at the bedside.  Disposition Plan: Home when stable.  Medical Consultants:  Dr. Rhina Brackett [place], [place]  Other Consultants:  Diabetes coordinator  Anti-infectives:  Cefipime 08/05/12--->08/06/12  Vancomycin 08/05/12--->08/06/12  Clindamycin 08/05/12--->  Primaquine 08/05/12--->   HPI/Subjective: Patrick Brown continues to report improvement in dyspnea/cough.  No chest pain, nausea or vomiting.  Objective: Filed Vitals:   08/07/12 0300 08/07/12 0400 08/07/12 0500 08/07/12 0600  BP:  111/73  153/83  Pulse: 72 64 71 79  Temp:  97.9 F (36.6 C)    TempSrc:  Oral    Resp: 23 22 22 24   Height:      Weight:      SpO2: 92% 94% 93% 93%    Intake/Output Summary (Last 24 hours) at 08/07/12 0707 Last data filed at 08/07/12 0600  Gross per 24 hour  Intake 2794.16 ml  Output   2075 ml  Net 719.16 ml    Exam: Gen:  NAD Cardiovascular:  RRR, No M/R/G Respiratory:  Diminished breath sounds bilaterally Gastrointestinal:  Abdomen soft, NT/ND, + BS Extremities:  No C/E/C  Data Reviewed: Basic Metabolic Panel:  Recent Labs Lab 08/06/12 0341 08/06/12 1613 08/06/12 2003 08/07/12 0030 08/07/12 0420  NA 131* 131* 127* 132* 133*  K 4.5 5.5* 4.1 4.0 4.4  CL 97 96 94* 99 101  CO2 24 22 22 22 23   GLUCOSE 226* 570* 537* 292*  169*  BUN 40* 50* 49* 51* 52*  CREATININE 1.56* 1.58* 1.56* 1.62* 1.54*  CALCIUM 9.3 9.4 9.5 9.9 9.7   GFR Estimated Creatinine Clearance: 63.9 ml/min (by C-G formula based on Cr of 1.54). Liver Function Tests:  Recent Labs Lab 08/05/12 1630  AST 13  ALT 9  ALKPHOS 102  BILITOT 0.3  PROT 6.4  ALBUMIN 2.3*    CBC:  Recent Labs Lab 08/05/12 1630 08/06/12 0341 08/06/12 1221 08/07/12 0420  WBC 14.5* 10.8*  --  14.4*  NEUTROABS 9.8*  --   --   --   HGB 8.9* 8.4* 8.6* 8.2*  HCT 28.0* 25.9* 27.2* 24.6*  MCV 89.7 88.4  --  87.5  PLT 327 312  --  354   BNP (last 3 results)  Recent Labs  04/16/12 2020 04/16/12 2220  PROBNP 66.5 94.3   CBG:  Recent Labs Lab 08/07/12 0221 08/07/12 0318 08/07/12 0418 08/07/12 0523 08/07/12 0618  GLUCAP 188* 184* 168* 136* 119*   Microbiology Recent Results (from the past 240 hour(s))  CULTURE, BLOOD (ROUTINE X 2)     Status: None   Collection Time    08/05/12  3:56 PM      Result Value Range Status   Specimen Description BLOOD RIGHT ARM   Final   Special Requests BOTTLES DRAWN AEROBIC AND ANAEROBIC 6CC   Final   Culture  Setup Time 08/05/2012 21:02   Final   Culture     Final   Value:        BLOOD CULTURE RECEIVED NO GROWTH TO DATE CULTURE WILL BE HELD FOR 5 DAYS BEFORE ISSUING A FINAL NEGATIVE REPORT   Report Status PENDING   Incomplete  CULTURE, BLOOD (ROUTINE X 2)     Status: None   Collection Time    08/05/12  4:30 PM      Result Value Range Status   Specimen Description BLOOD LEFT ARM   Final   Special Requests BOTTLES DRAWN AEROBIC AND ANAEROBIC 5CC   Final   Culture  Setup Time 08/05/2012 21:03   Final   Culture     Final   Value:        BLOOD CULTURE RECEIVED NO GROWTH TO DATE CULTURE WILL BE HELD FOR 5 DAYS BEFORE ISSUING A FINAL NEGATIVE REPORT   Report Status PENDING   Incomplete  URINE CULTURE     Status: None   Collection Time    08/05/12  6:00 PM      Result Value Range Status   Specimen Description URINE, CLEAN CATCH   Final   Special Requests NONE   Final   Culture  Setup Time 08/06/2012 01:49   Final   Colony Count NO GROWTH   Final   Culture NO GROWTH   Final   Report Status 08/07/2012 FINAL   Final  MRSA PCR SCREENING     Status: None   Collection Time    08/05/12  6:48 PM      Result Value Range Status    MRSA by PCR NEGATIVE  NEGATIVE Final   Comment:            The GeneXpert MRSA Assay (FDA     approved for NASAL specimens     only), is one component of a     comprehensive MRSA colonization     surveillance program. It is not     intended to diagnose MRSA     infection nor to guide or  monitor treatment for     MRSA infections.  AFB CULTURE, BLOOD     Status: None   Collection Time    08/05/12  7:54 PM      Result Value Range Status   Specimen Description BLOOD RIGHT ARM   Final   Special Requests BOTTLES DRAWN AEROBIC AND ANAEROBIC 5ML   Final   Culture     Final   Value: CULTURE WILL BE EXAMINED FOR 6 WEEKS BEFORE ISSUING A FINAL REPORT   Report Status PENDING   Incomplete     Procedures and Diagnostic Studies: Dg Chest 2 View  08/05/2012   *RADIOLOGY REPORT*  Clinical Data: Shortness of breath.  Poor oxygen saturation.  CHEST - 2 VIEW  Comparison: 07/08/2012 and multiple previous  Findings: Artifact overlies chest.  Heart size is normal.  There are patchy infiltrates throughout both lungs, most pronounced at the lung bases, consistent with pneumonia.  No effusions.  Old compression deformities are seen in the upper thoracic region.  IMPRESSION: Widespread bilateral bronchopneumonia most pronounced in the lower lobes.   Original Report Authenticated By: Nelson Chimes, M.D.    Scheduled Meds: . abacavir  600 mg Oral QHS   And  . lamiVUDine  300 mg Oral QHS  . acetaminophen  1,000 mg Oral BID  . antiseptic oral rinse  15 mL Mouth Rinse BID  . aspirin  81 mg Oral q morning - 10a  . clindamycin (CLEOCIN) IV  600 mg Intravenous Q8H  . dolutegravir  50 mg Oral QHS  . enoxaparin (LOVENOX) injection  40 mg Subcutaneous Q24H  . feeding supplement  237 mL Oral BID BM  . ferrous sulfate  650 mg Oral Q breakfast  . hydrOXYzine  25 mg Oral BID  . insulin regular  0-10 Units Intravenous TID WC  . levothyroxine  175 mcg Oral QAC breakfast  . methylPREDNISolone (SOLU-MEDROL) injection   40 mg Intravenous Q6H  . metoCLOPramide  5 mg Oral TID AC  . omega-3 acid ethyl esters  2 g Oral BID  . pantoprazole  40 mg Oral Daily  . primaquine  30 mg Oral Daily  . simvastatin  40 mg Oral QHS  . sodium bicarbonate  650 mg Oral q morning - 10a  . [START ON 08/11/2012] sodium polystyrene  30 g Oral Weekly  . testosterone  5 g Transdermal q morning - 10a  . venlafaxine XR  112.5 mg Oral q morning - 10a   Continuous Infusions: . sodium chloride Stopped (08/07/12 0152)  . dextrose 5 % and 0.45% NaCl 100 mL/hr at 08/07/12 0152  . insulin pump    . insulin (NOVOLIN-R) infusion 2.4 Units/hr (08/07/12 0620)    Time spent: 35 minutes with greater than 50% of time spent counseling the patient  on his test results, diagnostic impression, and plan of care.   LOS: 2 days   Makayela Secrest  [facility] Pager [phone].   *Please note that the hospitalists switch teams on Wednesdays. Please call the flow manager at [phone] if you are having difficulty reaching the hospitalist taking care of this patient as she can update you and provide the most up-to-date pager number of provider caring for the patient. If 8PM-8AM, please contact night-coverage at [email or web address], password [facility]  08/07/2012, 7:07 AM

## 2012-08-07 NOTE — Progress Notes (Addendum)
Inpatient Diabetes Program Recommendations  AACE/ADA: New Consensus Statement on Inpatient Glycemic Control (2013)  Target Ranges:  Prepandial:   less than 140 mg/dL      Peak postprandial:   less than 180 mg/dL (1-2 hours)      Critically ill patients:  140 - 180 mg/dL   Hyperglycemia in presence of steroid therapy. Paged by RN regarding pt using his insulin pump for correction vs the glycemic control order set using the resistant scale. After reviewing his cbg's of late and talking with other coordinators familiar with this pt status, I now recommend that the pt not use his own correction bolus as it is not going to strong enough to counteract the insulin resistance from the Solumedrol.  He would have to decrease his sensitivity factor, increase his insulin to carb ratio and increase his basal settings while on the insulin pump.  Therefore, I requested fron Shae, the RN to use the glycemic control order set using resistant correction scale q 4hrs and that the patient not correct his glucose with his meter and pump; however, he can still use his pump to cover his meals (which may not be enough insulin, but with the addition of the correction scale q 4hrs and timing the correction scale at the time he eats meals, we can get his glucose under control.  If need be, I am glad to go to [facility] and assist pt, RN, and MD with these recommendations. Thank you, Rosita Kea, RN, CNS, Diabetes Coordinator [phone])  Ad: pt does not need dextrose in IV fluids if eating well.

## 2012-08-07 NOTE — Progress Notes (Signed)
[facility] for Infectious Disease  Day # 3 clindamycin Day #3 primaquine  Subjective: pruritis   Antibiotics:  Anti-infectives   Start     Dose/Rate Route Frequency Ordered Stop   08/06/12 1800  vancomycin (VANCOCIN) IVPB 1000 mg/200 mL premix  Status:  Discontinued     1,000 mg 200 mL/hr over 60 Minutes Intravenous Every 12 hours 08/06/12 0900 08/06/12 1556   08/06/12 1000  dapsone tablet 100 mg  Status:  Discontinued     100 mg Oral  Every morning - 10a 08/05/12 1834 08/05/12 1919   08/05/12 2200  abacavir-lamiVUDine (EPZICOM) 600-300 MG per tablet 1 tablet  Status:  Discontinued     1 tablet Oral Daily at bedtime 08/05/12 1834 08/05/12 1902   08/05/12 2200  dolutegravir (TIVICAY) tablet 50 mg     50 mg Oral Daily at bedtime 08/05/12 1834     08/05/12 2200  ceFEPIme (MAXIPIME) 1 g in dextrose 5 % 50 mL IVPB  Status:  Discontinued     1 g 100 mL/hr over 30 Minutes Intravenous 3 times per day 08/05/12 1834 08/05/12 1848   08/05/12 2200  abacavir (ZIAGEN) tablet 600 mg     600 mg Oral Daily at bedtime 08/05/12 1904     08/05/12 2200  lamiVUDine (EPIVIR) tablet 300 mg     300 mg Oral Daily at bedtime 08/05/12 1904     08/05/12 2000  clindamycin (CLEOCIN) IVPB 600 mg     600 mg 100 mL/hr over 30 Minutes Intravenous Every 8 hours 08/05/12 1834     08/05/12 2000  primaquine tablet 30 mg     30 mg Oral Daily 08/05/12 1834     08/05/12 1800  vancomycin (VANCOCIN) IVPB 750 mg/150 ml premix  Status:  Discontinued     750 mg 150 mL/hr over 60 Minutes Intravenous Every 12 hours 08/05/12 1635 08/06/12 0900   08/05/12 1630  ceFEPIme (MAXIPIME) 1 g in dextrose 5 % 50 mL IVPB  Status:  Discontinued     1 g 100 mL/hr over 30 Minutes Intravenous Every 8 hours 08/05/12 1602 08/06/12 1556   08/05/12 1600  cefTRIAXone (ROCEPHIN) 1 g in dextrose 5 % 50 mL IVPB  Status:  Discontinued     1 g 100 mL/hr over 30 Minutes Intravenous  Once 08/05/12 1558 08/05/12 1559   08/05/12 1600   azithromycin (ZITHROMAX) 500 mg in dextrose 5 % 250 mL IVPB  Status:  Discontinued     500 mg 250 mL/hr over 60 Minutes Intravenous  Once 08/05/12 1558 08/05/12 1559      Medications: Scheduled Meds: . abacavir  600 mg Oral QHS   And  . lamiVUDine  300 mg Oral QHS  . acetaminophen  1,000 mg Oral BID  . antiseptic oral rinse  15 mL Mouth Rinse BID  . aspirin  81 mg Oral q morning - 10a  . clindamycin (CLEOCIN) IV  600 mg Intravenous Q8H  . dolutegravir  50 mg Oral QHS  . enoxaparin (LOVENOX) injection  40 mg Subcutaneous Q24H  . feeding supplement  237 mL Oral BID BM  . ferrous sulfate  650 mg Oral Q breakfast  . hydrOXYzine  25 mg Oral BID  . insulin regular  0-10 Units Intravenous TID WC  . levothyroxine  175 mcg Oral QAC breakfast  . methylPREDNISolone (SOLU-MEDROL) injection  40 mg Intravenous Q6H  . metoCLOPramide  5 mg Oral TID AC  . omega-3 acid ethyl esters  2 g Oral BID  . pantoprazole  40 mg Oral Daily  . primaquine  30 mg Oral Daily  . simvastatin  40 mg Oral QHS  . sodium bicarbonate  650 mg Oral q morning - 10a  . [START ON 08/11/2012] sodium polystyrene  30 g Oral Weekly  . testosterone  5 g Transdermal q morning - 10a  . venlafaxine XR  112.5 mg Oral q morning - 10a   Continuous Infusions: . sodium chloride Stopped (08/07/12 0152)  . dextrose 5 % and 0.45% NaCl 100 mL/hr at 08/07/12 0152  . insulin pump    . insulin (NOVOLIN-R) infusion 8.8 Units/hr (08/07/12 1016)   PRN Meds:.dextrose, diphenhydrAMINE-zinc acetate, ondansetron, traMADol   Objective: Weight change:   Intake/Output Summary (Last 24 hours) at 08/07/12 1056 Last data filed at 08/07/12 1000  Gross per 24 hour  Intake 3223.22 ml  Output   2575 ml  Net 648.22 ml   Blood pressure 127/73, pulse 96, temperature 97.6 F (36.4 C), temperature source Oral, resp. rate 26, height 5\' 10"  (1.778 m), weight 174 lb 6.1 oz (79.1 kg), SpO2 92.00%. Temp:  [97.6 F (36.4 C)-98.1 F (36.7 C)] 97.6 F  (36.4 C) (07/04 0800) Pulse Rate:  [64-103] 96 (07/04 1000) Resp:  [22-38] 26 (07/04 1000) BP: (111-153)/(54-85) 127/73 mmHg (07/04 1000) SpO2:  [86 %-94 %] 92 % (07/04 1000)  Physical Exam: General: Alert and awake, oriented x3, not in any acute distress.  HEENT: anicteric sclera, pupils reactive to light and accommodation, EOMI, oropharynx clear and without exudate  CVS regular rate, normal r, no murmur rubs or gallops  Chest:course breath sounds throughout with prolonged expiratory phase  Abdomen: soft nontender, nondistended, normal bowel sounds,  Extremities: no clubbing or edema noted bilaterally  Skin: areas of excoriation Neuro: nonfocal, strength and sensation intact   Lab Results:  Recent Labs  08/06/12 0341 08/06/12 1221 08/07/12 0420  WBC 10.8*  --  14.4*  HGB 8.4* 8.6* 8.2*  HCT 25.9* 27.2* 24.6*  PLT 312  --  354    BMET  Recent Labs  08/07/12 0420 08/07/12 0807  NA 133* 134*  K 4.4 4.3  CL 101 102  CO2 23 24  GLUCOSE 169* 155*  BUN 52* 51*  CREATININE 1.54* 1.44*  CALCIUM 9.7 9.8    Micro Results: Recent Results (from the past 240 hour(s))  CULTURE, BLOOD (ROUTINE X 2)     Status: None   Collection Time    08/05/12  3:56 PM      Result Value Range Status   Specimen Description BLOOD RIGHT ARM   Final   Special Requests BOTTLES DRAWN AEROBIC AND ANAEROBIC 6CC   Final   Culture  Setup Time 08/05/2012 21:02   Final   Culture     Final   Value:        BLOOD CULTURE RECEIVED NO GROWTH TO DATE CULTURE WILL BE HELD FOR 5 DAYS BEFORE ISSUING A FINAL NEGATIVE REPORT   Report Status PENDING   Incomplete  CULTURE, BLOOD (ROUTINE X 2)     Status: None   Collection Time    08/05/12  4:30 PM      Result Value Range Status   Specimen Description BLOOD LEFT ARM   Final   Special Requests BOTTLES DRAWN AEROBIC AND ANAEROBIC 5CC   Final   Culture  Setup Time 08/05/2012 21:03   Final   Culture     Final   Value:  BLOOD CULTURE RECEIVED NO GROWTH TO  DATE CULTURE WILL BE HELD FOR 5 DAYS BEFORE ISSUING A FINAL NEGATIVE REPORT   Report Status PENDING   Incomplete  URINE CULTURE     Status: None   Collection Time    08/05/12  6:00 PM      Result Value Range Status   Specimen Description URINE, CLEAN CATCH   Final   Special Requests NONE   Final   Culture  Setup Time 08/06/2012 01:49   Final   Colony Count NO GROWTH   Final   Culture NO GROWTH   Final   Report Status 08/07/2012 FINAL   Final  MRSA PCR SCREENING     Status: None   Collection Time    08/05/12  6:48 PM      Result Value Range Status   MRSA by PCR NEGATIVE  NEGATIVE Final   Comment:            The GeneXpert MRSA Assay (FDA     approved for NASAL specimens     only), is one component of a     comprehensive MRSA colonization     surveillance program. It is not     intended to diagnose MRSA     infection nor to guide or     monitor treatment for     MRSA infections.  AFB CULTURE, BLOOD     Status: None   Collection Time    08/05/12  7:54 PM      Result Value Range Status   Specimen Description BLOOD RIGHT ARM   Final   Special Requests BOTTLES DRAWN AEROBIC AND ANAEROBIC 5ML   Final   Culture     Final   Value: CULTURE WILL BE EXAMINED FOR 6 WEEKS BEFORE ISSUING A FINAL REPORT   Report Status PENDING   Incomplete    Studies/Results: Dg Chest 2 View  08/05/2012   *RADIOLOGY REPORT*  Clinical Data: Shortness of breath.  Poor oxygen saturation.  CHEST - 2 VIEW  Comparison: 07/08/2012 and multiple previous  Findings: Artifact overlies chest.  Heart size is normal.  There are patchy infiltrates throughout both lungs, most pronounced at the lung bases, consistent with pneumonia.  No effusions.  Old compression deformities are seen in the upper thoracic region.  IMPRESSION: Widespread bilateral bronchopneumonia most pronounced in the lower lobes.   Original Report Authenticated By: Nelson Chimes, M.D.      Assessment/Plan: Patrick Brown is a 42 y.o. male with HIV/AIDS,  admitted with what clinically seems HIGHLY consistent with PCP pneumonia   #1 PCP Pneuonia   --continue clinda/primaquin + IV steroids  --O2   #2 HIV/AIDS: continue Tivicay/Epzicom , not Tivicay can have very slight effect on serum creatinine as well via OCT transporter  #3 Hyperkalemia: I dc'd his ARB and I DO NOT SEE WHY he should be on this with hx of hyperK requiring lasix and weekly kayex. I also dc'd lasix for now to ensure he is being hydrated well   #4 Pruritis: may be related to HIV itself --I will add claritin daily and escalate his atarax dose ---My understanding is that topical benadryl can cause rebound pruritis so would not use it]  #5 Groin problem: sound more fungal but improving, will examine tomorrow.      LOS: 2 days   Alcide Evener 08/07/2012, 10:56 AM

## 2012-08-08 LAB — BASIC METABOLIC PANEL WITH GFR
BUN: 46 mg/dL — ABNORMAL HIGH (ref 6–23)
CO2: 20 meq/L (ref 19–32)
Calcium: 9.9 mg/dL (ref 8.4–10.5)
Chloride: 105 meq/L (ref 96–112)
Creatinine, Ser: 1.24 mg/dL (ref 0.50–1.35)
GFR calc Af Amer: 81 mL/min — ABNORMAL LOW
GFR calc non Af Amer: 70 mL/min — ABNORMAL LOW
Glucose, Bld: 111 mg/dL — ABNORMAL HIGH (ref 70–99)
Potassium: 4.3 meq/L (ref 3.5–5.1)
Sodium: 135 meq/L (ref 135–145)

## 2012-08-08 LAB — GLUCOSE, CAPILLARY
Glucose-Capillary: 101 mg/dL — ABNORMAL HIGH (ref 70–99)
Glucose-Capillary: 105 mg/dL — ABNORMAL HIGH (ref 70–99)
Glucose-Capillary: 111 mg/dL — ABNORMAL HIGH (ref 70–99)
Glucose-Capillary: 120 mg/dL — ABNORMAL HIGH (ref 70–99)
Glucose-Capillary: 129 mg/dL — ABNORMAL HIGH (ref 70–99)
Glucose-Capillary: 154 mg/dL — ABNORMAL HIGH (ref 70–99)
Glucose-Capillary: 345 mg/dL — ABNORMAL HIGH (ref 70–99)
Glucose-Capillary: 515 mg/dL — ABNORMAL HIGH (ref 70–99)

## 2012-08-08 LAB — WOUND CULTURE

## 2012-08-08 LAB — CBC
MCH: 28.8 pg (ref 26.0–34.0)
MCHC: 32.9 g/dL (ref 30.0–36.0)
MCV: 87.5 fL (ref 78.0–100.0)
Platelets: 375 10*3/uL (ref 150–400)
RBC: 2.57 MIL/uL — ABNORMAL LOW (ref 4.22–5.81)

## 2012-08-08 MED ORDER — INSULIN ASPART 100 UNIT/ML ~~LOC~~ SOLN
25.0000 [IU] | Freq: Once | SUBCUTANEOUS | Status: AC
  Administered 2012-08-08: 25 [IU] via SUBCUTANEOUS

## 2012-08-08 MED ORDER — INSULIN ASPART 100 UNIT/ML ~~LOC~~ SOLN
0.0000 [IU] | SUBCUTANEOUS | Status: DC
  Administered 2012-08-08: 20 [IU] via SUBCUTANEOUS
  Administered 2012-08-08: 15 [IU] via SUBCUTANEOUS
  Administered 2012-08-09: 11 [IU] via SUBCUTANEOUS
  Administered 2012-08-09: 20 [IU] via SUBCUTANEOUS
  Administered 2012-08-10: 15 [IU] via SUBCUTANEOUS

## 2012-08-08 NOTE — Progress Notes (Signed)
[facility]  Day # 4 clindamycin Day #4 primaquine  Subjective: Pruritis better, still on 6L [place]   Antibiotics:  Anti-infectives   Start     Dose/Rate Route Frequency Ordered Stop   08/06/12 1800  vancomycin (VANCOCIN) IVPB 1000 mg/200 mL premix  Status:  Discontinued     1,000 mg 200 mL/hr over 60 Minutes Intravenous Every 12 hours 08/06/12 0900 08/06/12 1556   08/06/12 1000  dapsone tablet 100 mg  Status:  Discontinued     100 mg Oral  Every morning - 10a 08/05/12 1834 08/05/12 1919   08/05/12 2200  abacavir-lamiVUDine (EPZICOM) 600-300 MG per tablet 1 tablet  Status:  Discontinued     1 tablet Oral Daily at bedtime 08/05/12 1834 08/05/12 1902   08/05/12 2200  dolutegravir (TIVICAY) tablet 50 mg     50 mg Oral Daily at bedtime 08/05/12 1834     08/05/12 2200  ceFEPIme (MAXIPIME) 1 g in dextrose 5 % 50 mL IVPB  Status:  Discontinued     1 g 100 mL/hr over 30 Minutes Intravenous 3 times per day 08/05/12 1834 08/05/12 1848   08/05/12 2200  abacavir (ZIAGEN) tablet 600 mg     600 mg Oral Daily at bedtime 08/05/12 1904     08/05/12 2200  lamiVUDine (EPIVIR) tablet 300 mg     300 mg Oral Daily at bedtime 08/05/12 1904     08/05/12 2000  clindamycin (CLEOCIN) IVPB 600 mg     600 mg 100 mL/hr over 30 Minutes Intravenous Every 8 hours 08/05/12 1834     08/05/12 2000  primaquine tablet 30 mg     30 mg Oral Daily 08/05/12 1834     08/05/12 1800  vancomycin (VANCOCIN) IVPB 750 mg/150 ml premix  Status:  Discontinued     750 mg 150 mL/hr over 60 Minutes Intravenous Every 12 hours 08/05/12 1635 08/06/12 0900   08/05/12 1630  ceFEPIme (MAXIPIME) 1 g in dextrose 5 % 50 mL IVPB  Status:  Discontinued     1 g 100 mL/hr over 30 Minutes Intravenous Every 8 hours 08/05/12 1602 08/06/12 1556   08/05/12 1600  cefTRIAXone (ROCEPHIN) 1 g in dextrose 5 % 50 mL IVPB  Status:  Discontinued     1 g 100 mL/hr over 30 Minutes Intravenous  Once 08/05/12 1558 08/05/12 1559   08/05/12 1600  azithromycin (ZITHROMAX) 500 mg in dextrose 5 % 250 mL IVPB  Status:  Discontinued     500 mg 250 mL/hr over 60 Minutes Intravenous  Once 08/05/12 1558 08/05/12 1559      Medications: Scheduled Meds: . abacavir  600 mg Oral QHS   And  . lamiVUDine  300 mg Oral QHS  . acetaminophen  1,000 mg Oral BID  . antiseptic oral rinse  15 mL Mouth Rinse BID  . aspirin  81 mg Oral q morning - 10a  . clindamycin (CLEOCIN) IV  600 mg Intravenous Q8H  . dolutegravir  50 mg Oral QHS  . enoxaparin (LOVENOX) injection  40 mg Subcutaneous Q24H  . feeding supplement  237 mL Oral BID BM  . ferrous sulfate  650 mg Oral Q breakfast  . insulin aspart  0-20 Units Subcutaneous Q4H  . levothyroxine  175 mcg Oral QAC breakfast  . loratadine  10 mg Oral Daily  . methylPREDNISolone (SOLU-MEDROL) injection  40 mg Intravenous Q6H  . metoCLOPramide  5 mg Oral TID AC  . omega-3 acid  ethyl esters  2 g Oral BID  . pantoprazole  40 mg Oral Daily  . primaquine  30 mg Oral Daily  . simvastatin  40 mg Oral QHS  . sodium bicarbonate  650 mg Oral q morning - 10a  . testosterone  5 g Transdermal q morning - 10a  . venlafaxine XR  112.5 mg Oral q morning - 10a   Continuous Infusions: . sodium chloride 50 mL/hr at 08/08/12 0901  . insulin pump     PRN Meds:.dextrose, hydrOXYzine, ondansetron, traMADol   Objective: Weight change:   Intake/Output Summary (Last 24 hours) at 08/08/12 1302 Last data filed at 08/08/12 1219  Gross per 24 hour  Intake 2419.58 ml  Output    950 ml  Net 1469.58 ml   Blood pressure 136/73, pulse 87, temperature 97.5 F (36.4 C), temperature source Oral, resp. rate 18, height 5\' 10"  (1.778 m), weight 174 lb 6.1 oz (79.1 kg), SpO2 95.00%. Temp:  [97.3 F (36.3 C)-97.8 F (36.6 C)] 97.5 F (36.4 C) (07/05 0700) Pulse Rate:  [55-95] 87 (07/05 1200) Resp:  [14-24] 18 (07/05 1200) BP: (113-145)/(61-88) 136/73 mmHg (07/05 1200) SpO2:  [88 %-97 %] 95 % (07/05  1200)  Physical Exam: General: Alert and awake, oriented x3, not in any acute distress.  HEENT: anicteric sclera, pupils reactive to light and accommodation, EOMI, oropharynx clear and without exudate  CVS regular rate, normal r, no murmur rubs or gallops  Chest:course breath sounds throughout with prolonged expiratory phase  Abdomen: soft nontender, nondistended, normal bowel sounds,  Extremities: no clubbing or edema noted bilaterally  Skin: areas of excoriation Neuro: nonfocal, strength and sensation intact   Lab Results:  Recent Labs  08/07/12 0420 08/08/12 0345  WBC 14.4* 14.1*  HGB 8.2* 7.4*  HCT 24.6* 22.5*  PLT 354 375    BMET  Recent Labs  08/07/12 1224 08/08/12 0345  NA 131* 135  K 3.7 4.3  CL 98 105  CO2 21 20  GLUCOSE 255* 111*  BUN 50* 46*  CREATININE 1.49* 1.24  CALCIUM 10.5 9.9    Micro Results: Recent Results (from the past 240 hour(s))  WOUND CULTURE     Status: None   Collection Time    08/03/12  4:17 PM      Result Value Range Status   Culture Few STAPHYLOCOCCUS AUREUS   Final   Comment: RIGHT GROIN   GRAM STAIN No WBC Seen   Final   GRAM STAIN No Squamous Epithelial Cells Seen   Final   GRAM STAIN Moderate Gram Positive Cocci In Pairs In Clusters   Final   Organism ID, Bacteria STAPHYLOCOCCUS AUREUS   Final   Comment: Rifampin and Gentamicin should not be used as     single drugs for treatment of Staph infections.     This organism is presumed to be Clindamycin     resistant based on detection of inducible     Clindamycin resistance.  CULTURE, BLOOD (ROUTINE X 2)     Status: None   Collection Time    08/05/12  3:56 PM      Result Value Range Status   Specimen Description BLOOD RIGHT ARM   Final   Special Requests BOTTLES DRAWN AEROBIC AND ANAEROBIC [facility]   Final   Culture  Setup Time 08/05/2012 21:02   Final   Culture     Final   Value:        BLOOD CULTURE RECEIVED NO GROWTH TO DATE CULTURE  WILL BE HELD FOR 5 DAYS BEFORE ISSUING A  FINAL NEGATIVE REPORT   Report Status PENDING   Incomplete  CULTURE, BLOOD (ROUTINE X 2)     Status: None   Collection Time    08/05/12  4:30 PM      Result Value Range Status   Specimen Description BLOOD LEFT ARM   Final   Special Requests BOTTLES DRAWN AEROBIC AND ANAEROBIC 5CC   Final   Culture  Setup Time 08/05/2012 21:03   Final   Culture     Final   Value:        BLOOD CULTURE RECEIVED NO GROWTH TO DATE CULTURE WILL BE HELD FOR 5 DAYS BEFORE ISSUING A FINAL NEGATIVE REPORT   Report Status PENDING   Incomplete  URINE CULTURE     Status: None   Collection Time    08/05/12  6:00 PM      Result Value Range Status   Specimen Description URINE, CLEAN CATCH   Final   Special Requests NONE   Final   Culture  Setup Time 08/06/2012 01:49   Final   Colony Count NO GROWTH   Final   Culture NO GROWTH   Final   Report Status 08/07/2012 FINAL   Final  MRSA PCR SCREENING     Status: None   Collection Time    08/05/12  6:48 PM      Result Value Range Status   MRSA by PCR NEGATIVE  NEGATIVE Final   Comment:            The GeneXpert MRSA Assay (FDA     approved for NASAL specimens     only), is one component of a     comprehensive MRSA colonization     surveillance program. It is not     intended to diagnose MRSA     infection nor to guide or     monitor treatment for     MRSA infections.  AFB CULTURE, BLOOD     Status: None   Collection Time    08/05/12  7:54 PM      Result Value Range Status   Specimen Description BLOOD RIGHT ARM   Final   Special Requests BOTTLES DRAWN AEROBIC AND ANAEROBIC 5ML   Final   Culture     Final   Value: CULTURE WILL BE EXAMINED FOR 6 WEEKS BEFORE ISSUING A FINAL REPORT   Report Status PENDING   Incomplete    Studies/Results: No results found.    Assessment/Plan: Patrick Brown is a 42 y.o. male with HIV/AIDS, admitted with what clinically seems HIGHLY consistent with PCP pneumonia   #1 PCP Pneuonia   --continue clinda/primaquin + IV steroids   --O2   #2 HIV/AIDS: continue Tivicay/Epzicom , not Tivicay can have very slight effect on serum creatinine as well via OCT transporter  #3 Hyperkalemia: I dc'd his ARB and I DO NOT SEE WHY he should be on this with hx of hyperK requiring lasix and weekly kayex. I also dc'd lasix for now to ensure he is being hydrated well . DC weekly kayexylate  #4 Pruritis: may be related to HIV itself I added claritin daily and escalated  his atarax dose  #5 Groin problem: sound more fungal but improving      LOS: 3 days   Alcide Evener 08/08/2012, 1:02 PM

## 2012-08-08 NOTE — Progress Notes (Signed)
[facility] PROGRESS NOTE  [name] [id number] DOB: 1970/06/09 DOA: 08/05/2012 PCP: Redge Gainer, MD  Brief narrative: Patrick Brown is an 42 y.o. male with a PMH of recently diagnosed HIV 05/2012, CD4 62 on 06/19/12, on Tivicay and epzicom, seen in ID clinic 08/05/12 by Dr. Linus Salmons and sent to the ER for further evaluation secondary to hypoxia, fever, malaise and generalized weakness.  He is being treated for PCP pneumonia.  Assessment/Plan: Principal Problem:  Sepsis secondary to HCAP (healthcare-associated pneumonia) with acute hypoxic respiratory failure and hypoxia  -Given immunocompromise and recent hospitalization, he was initially covered for both HCAP and PCP.  -Discussed case with Dr. Tommy Medal 08/05/12 who recommended clindamycin and Primaquin for PCP and cefepime/vancomycin for HCAP. Dr. Tommy Medal evaluated the patient on 08/07/12 and discontinued Cefepime and Vancomycin, as presentation most consistent with PCP.  -F/U Blood cultures/AFB blood cultures.  -Strep pneumonia andLegionella antigens negative.  -Will transfer out of step down unit. Sats 92-94%.  Initially on NRB mask, now on nasal cannula at 7 L/min. -Continue supplemental oxygen as needed to keep oxygen saturations greater than 92%.  -Continue Solu-Medrol 40 mg IV every 6 hours. Would not taper yet due to ongoing high oxygen requirement. Active Problems: Hyperkalemia -ARB d/c'd.  Chronic kidney disease, stage 3, mod decreased GFR  -Baseline creatinine 1.6-1.8. Current creatinine improved over usual baseline values.  DM (diabetes mellitus), type 1, uncontrolled  -Given marked elevation of blood glucoses, insulin pump d/c'd and the patient was placed on an insulin drip per glucommander protocol. -No evidence of DKA.  CBGs now 85-114. -Remains on insulin drip with plans to resume insulin pump and insulin resistant SSI Q 4 hours today. Hypothyroid  -Continue home dose of Synthroid.  Chronic normocytic anemia, iron  deficiency  -Continue iron therapy. Also receives Aranesp as an outpatient.  Gastroparesis  -Continue Reglan.  Sinus tachycardia  -Likely from hypoxia. Improved on oxygen. HIV disease  -Continue HIV therapy. CD4 count increased to 70 (from 20 06/19/12).  Hyponatremia  -Resolved with correction of blood glucoses.   Code Status: Full.  Family Communication: No family currently at the bedside.  Disposition Plan: Home when stable.  Medical Consultants:  Dr. Rhina Brackett [place], [place]  Other Consultants:  Diabetes coordinator  Anti-infectives:  Cefipime 08/05/12--->08/06/12  Vancomycin 08/05/12--->08/06/12  Clindamycin 08/05/12--->  Primaquine 08/05/12--->   HPI/Subjective: Patrick Brown continues to report improvement in dyspnea/cough.  No chest pain, nausea or vomiting.  Had some diarrhea yesterday.  Continues to report pruritic skin.  Dr. Tommy Medal increased his Atarax and put him on Claritin for this.  Objective: Filed Vitals:   08/07/12 2011 08/08/12 0000 08/08/12 0041 08/08/12 0400  BP: 145/71  128/88 113/61  Pulse: 86  79 57  Temp:  97.5 F (36.4 C)  97.3 F (36.3 C)  TempSrc:  Oral  Oral  Resp: 24  21 14   Height:      Weight:      SpO2: 97%  95% 95%    Intake/Output Summary (Last 24 hours) at 08/08/12 0701 Last data filed at 08/08/12 0600  Gross per 24 hour  Intake 3117.82 ml  Output    950 ml  Net 2167.82 ml    Exam: Gen:  NAD Cardiovascular:  RRR, No M/R/G Respiratory:  Diminished breath sounds bilaterally Gastrointestinal:  Abdomen soft, NT/ND, + BS Extremities:  No C/E/C  Data Reviewed: Basic Metabolic Panel:  Recent Labs Lab 08/07/12 0030 08/07/12 0420 08/07/12 0807 08/07/12 1224 08/08/12  0345  NA 132* 133* 134* 131* 135  K 4.0 4.4 4.3 3.7 4.3  CL 99 101 102 98 105  CO2 22 23 24 21 20   GLUCOSE 292* 169* 155* 255* 111*  BUN 51* 52* 51* 50* 46*  CREATININE 1.62* 1.54* 1.44* 1.49* 1.24  CALCIUM 9.9 9.7 9.8 10.5 9.9   GFR Estimated Creatinine  Clearance: 79.3 ml/min (by C-G formula based on Cr of 1.24). Liver Function Tests:  Recent Labs Lab 08/05/12 1630  AST 13  ALT 9  ALKPHOS 102  BILITOT 0.3  PROT 6.4  ALBUMIN 2.3*   CBC:  Recent Labs Lab 08/05/12 1630 08/06/12 0341 08/06/12 1221 08/07/12 0420 08/08/12 0345  WBC 14.5* 10.8*  --  14.4* 14.1*  NEUTROABS 9.8*  --   --   --   --   HGB 8.9* 8.4* 8.6* 8.2* 7.4*  HCT 28.0* 25.9* 27.2* 24.6* 22.5*  MCV 89.7 88.4  --  87.5 87.5  PLT 327 312  --  354 375   BNP (last 3 results)  Recent Labs  04/16/12 2020 04/16/12 2220  PROBNP 66.5 94.3   CBG:  Recent Labs Lab 08/08/12 0146 08/08/12 0250 08/08/12 0358 08/08/12 0502 08/08/12 0608  GLUCAP 85 105* 107* 111* 114*   Microbiology Recent Results (from the past 240 hour(s))  CULTURE, BLOOD (ROUTINE X 2)     Status: None   Collection Time    08/05/12  3:56 PM      Result Value Range Status   Specimen Description BLOOD RIGHT ARM   Final   Special Requests BOTTLES DRAWN AEROBIC AND ANAEROBIC 6CC   Final   Culture  Setup Time 08/05/2012 21:02   Final   Culture     Final   Value:        BLOOD CULTURE RECEIVED NO GROWTH TO DATE CULTURE WILL BE HELD FOR 5 DAYS BEFORE ISSUING A FINAL NEGATIVE REPORT   Report Status PENDING   Incomplete  CULTURE, BLOOD (ROUTINE X 2)     Status: None   Collection Time    08/05/12  4:30 PM      Result Value Range Status   Specimen Description BLOOD LEFT ARM   Final   Special Requests BOTTLES DRAWN AEROBIC AND ANAEROBIC 5CC   Final   Culture  Setup Time 08/05/2012 21:03   Final   Culture     Final   Value:        BLOOD CULTURE RECEIVED NO GROWTH TO DATE CULTURE WILL BE HELD FOR 5 DAYS BEFORE ISSUING A FINAL NEGATIVE REPORT   Report Status PENDING   Incomplete  URINE CULTURE     Status: None   Collection Time    08/05/12  6:00 PM      Result Value Range Status   Specimen Description URINE, CLEAN CATCH   Final   Special Requests NONE   Final   Culture  Setup Time 08/06/2012  01:49   Final   Colony Count NO GROWTH   Final   Culture NO GROWTH   Final   Report Status 08/07/2012 FINAL   Final  MRSA PCR SCREENING     Status: None   Collection Time    08/05/12  6:48 PM      Result Value Range Status   MRSA by PCR NEGATIVE  NEGATIVE Final   Comment:            The GeneXpert MRSA Assay (FDA     approved for NASAL  specimens     only), is one component of a     comprehensive MRSA colonization     surveillance program. It is not     intended to diagnose MRSA     infection nor to guide or     monitor treatment for     MRSA infections.  AFB CULTURE, BLOOD     Status: None   Collection Time    08/05/12  7:54 PM      Result Value Range Status   Specimen Description BLOOD RIGHT ARM   Final   Special Requests BOTTLES DRAWN AEROBIC AND ANAEROBIC 5ML   Final   Culture     Final   Value: CULTURE WILL BE EXAMINED FOR 6 WEEKS BEFORE ISSUING A FINAL REPORT   Report Status PENDING   Incomplete     Procedures and Diagnostic Studies: Dg Chest 2 View  08/05/2012   *RADIOLOGY REPORT*  Clinical Data: Shortness of breath.  Poor oxygen saturation.  CHEST - 2 VIEW  Comparison: 07/08/2012 and multiple previous  Findings: Artifact overlies chest.  Heart size is normal.  There are patchy infiltrates throughout both lungs, most pronounced at the lung bases, consistent with pneumonia.  No effusions.  Old compression deformities are seen in the upper thoracic region.  IMPRESSION: Widespread bilateral bronchopneumonia most pronounced in the lower lobes.   Original Report Authenticated By: Nelson Chimes, M.D.    Scheduled Meds: . abacavir  600 mg Oral QHS   And  . lamiVUDine  300 mg Oral QHS  . acetaminophen  1,000 mg Oral BID  . antiseptic oral rinse  15 mL Mouth Rinse BID  . aspirin  81 mg Oral q morning - 10a  . clindamycin (CLEOCIN) IV  600 mg Intravenous Q8H  . dolutegravir  50 mg Oral QHS  . enoxaparin (LOVENOX) injection  40 mg Subcutaneous Q24H  . feeding supplement  237 mL  Oral BID BM  . ferrous sulfate  650 mg Oral Q breakfast  . insulin regular  0-10 Units Intravenous TID WC  . levothyroxine  175 mcg Oral QAC breakfast  . loratadine  10 mg Oral Daily  . methylPREDNISolone (SOLU-MEDROL) injection  40 mg Intravenous Q6H  . metoCLOPramide  5 mg Oral TID AC  . omega-3 acid ethyl esters  2 g Oral BID  . pantoprazole  40 mg Oral Daily  . primaquine  30 mg Oral Daily  . simvastatin  40 mg Oral QHS  . sodium bicarbonate  650 mg Oral q morning - 10a  . [START ON 08/11/2012] sodium polystyrene  30 g Oral Weekly  . testosterone  5 g Transdermal q morning - 10a  . venlafaxine XR  112.5 mg Oral q morning - 10a   Continuous Infusions: . sodium chloride 100 mL/hr (08/08/12 0038)  . insulin pump    . insulin (NOVOLIN-R) infusion 2.2 Units/hr (08/08/12 [id number])    Time spent: 35 minutes with greater than 50% of time spent counseling the patient  on his test results, diagnostic impression, and plan of care.   LOS: 3 days   [facility] Pager [phone].   *Please note that the hospitalists switch teams on Wednesdays. Please call the flow manager at [phone] if you are having difficulty reaching the hospitalist taking care of this patient as she can update you and provide the most up-to-date pager number of provider caring for the patient. If 8PM-8AM, please contact night-coverage at [email or web address], password [facility]  08/08/2012, 7:01 AM

## 2012-08-08 NOTE — Progress Notes (Signed)
PT found attached to insulin pump after several discussions explaining that he is on an insulin drip, therefore does not require his insulin. Pt had stated his understanding at beginning of shift. Further explained to pt the dangers of being attached to insulin pump while on the insulin drip. Pt states understanding.

## 2012-08-09 DIAGNOSIS — N189 Chronic kidney disease, unspecified: Secondary | ICD-10-CM

## 2012-08-09 DIAGNOSIS — B20 Human immunodeficiency virus [HIV] disease: Principal | ICD-10-CM

## 2012-08-09 DIAGNOSIS — E875 Hyperkalemia: Secondary | ICD-10-CM

## 2012-08-09 DIAGNOSIS — L299 Pruritus, unspecified: Secondary | ICD-10-CM

## 2012-08-09 DIAGNOSIS — B59 Pneumocystosis: Secondary | ICD-10-CM

## 2012-08-09 DIAGNOSIS — R21 Rash and other nonspecific skin eruption: Secondary | ICD-10-CM

## 2012-08-09 DIAGNOSIS — S31109A Unspecified open wound of abdominal wall, unspecified quadrant without penetration into peritoneal cavity, initial encounter: Secondary | ICD-10-CM

## 2012-08-09 DIAGNOSIS — E119 Type 2 diabetes mellitus without complications: Secondary | ICD-10-CM

## 2012-08-09 DIAGNOSIS — R197 Diarrhea, unspecified: Secondary | ICD-10-CM

## 2012-08-09 DIAGNOSIS — L538 Other specified erythematous conditions: Secondary | ICD-10-CM

## 2012-08-09 LAB — GLUCOSE, CAPILLARY
Glucose-Capillary: 63 mg/dL — ABNORMAL LOW (ref 70–99)
Glucose-Capillary: 194 mg/dL — ABNORMAL HIGH (ref 70–99)
Glucose-Capillary: 393 mg/dL — ABNORMAL HIGH (ref 70–99)

## 2012-08-09 LAB — CBC
MCH: 28.8 pg (ref 26.0–34.0)
MCV: 88.5 fL (ref 78.0–100.0)
Platelets: 446 10*3/uL — ABNORMAL HIGH (ref 150–400)
RDW: 15.9 % — ABNORMAL HIGH (ref 11.5–15.5)
WBC: 17.3 10*3/uL — ABNORMAL HIGH (ref 4.0–10.5)

## 2012-08-09 LAB — HIV-1 RNA ULTRAQUANT REFLEX TO GENTYP+
HIV 1 RNA Quant: 1174 copies/mL — ABNORMAL HIGH (ref <20)
HIV-1 RNA Quant, Log: 3.07 {Log} — ABNORMAL HIGH (ref <1.30)

## 2012-08-09 LAB — BASIC METABOLIC PANEL
Calcium: 10.7 mg/dL — ABNORMAL HIGH (ref 8.4–10.5)
Creatinine, Ser: 1.23 mg/dL (ref 0.50–1.35)
GFR calc Af Amer: 82 mL/min — ABNORMAL LOW (ref >90)
GFR calc non Af Amer: 70 mL/min — ABNORMAL LOW (ref >90)

## 2012-08-09 MED ORDER — NYSTATIN 100000 UNIT/GM EX POWD
Freq: Three times a day (TID) | CUTANEOUS | Status: DC
  Administered 2012-08-09 – 2012-08-11 (×6): via TOPICAL
  Filled 2012-08-09: qty 15

## 2012-08-09 MED ORDER — CLINDAMYCIN HCL 300 MG PO CAPS
450.0000 mg | ORAL_CAPSULE | Freq: Four times a day (QID) | ORAL | Status: DC
  Administered 2012-08-09 – 2012-08-12 (×13): 450 mg via ORAL
  Filled 2012-08-09 (×17): qty 1

## 2012-08-09 MED ORDER — CEPHALEXIN 500 MG PO CAPS
500.0000 mg | ORAL_CAPSULE | Freq: Four times a day (QID) | ORAL | Status: DC
  Administered 2012-08-09 (×2): 500 mg via ORAL
  Filled 2012-08-09 (×4): qty 1

## 2012-08-09 MED ORDER — PREDNISONE 20 MG PO TABS
40.0000 mg | ORAL_TABLET | Freq: Two times a day (BID) | ORAL | Status: DC
  Administered 2012-08-09 – 2012-08-12 (×7): 40 mg via ORAL
  Filled 2012-08-09 (×8): qty 2

## 2012-08-09 MED ORDER — TRIAMCINOLONE ACETONIDE 0.5 % EX CREA
TOPICAL_CREAM | Freq: Two times a day (BID) | CUTANEOUS | Status: DC
  Administered 2012-08-10 – 2012-08-11 (×3): via TOPICAL
  Filled 2012-08-09: qty 15

## 2012-08-09 MED ORDER — INSULIN GLARGINE 100 UNIT/ML ~~LOC~~ SOLN
20.0000 [IU] | Freq: Every day | SUBCUTANEOUS | Status: DC
  Filled 2012-08-09: qty 0.2

## 2012-08-09 MED ORDER — INSULIN ASPART 100 UNIT/ML ~~LOC~~ SOLN
25.0000 [IU] | Freq: Once | SUBCUTANEOUS | Status: AC
  Administered 2012-08-09: 25 [IU] via SUBCUTANEOUS

## 2012-08-09 MED ORDER — AZITHROMYCIN 600 MG PO TABS: 1200.0000 mg | ORAL_TABLET | ORAL | Status: DC

## 2012-08-09 NOTE — Progress Notes (Addendum)
[facility] for Infectious Disease  Day # 5 clindamycin Day #5  primaquine  Subjective: Pruritis better, still on 5L [place]   Antibiotics:  Anti-infectives   Start     Dose/Rate Route Frequency Ordered Stop   08/09/12 1400  clindamycin (CLEOCIN) capsule 450 mg     450 mg Oral 4 times per day 08/09/12 1320     08/09/12 1000  cephALEXin (KEFLEX) capsule 500 mg     500 mg Oral 4 times daily 08/09/12 0701     08/06/12 1800  vancomycin (VANCOCIN) IVPB 1000 mg/200 mL premix  Status:  Discontinued     1,000 mg 200 mL/hr over 60 Minutes Intravenous Every 12 hours 08/06/12 0900 08/06/12 1556   08/06/12 1000  dapsone tablet 100 mg  Status:  Discontinued     100 mg Oral  Every morning - 10a 08/05/12 1834 08/05/12 1919   08/05/12 2200  abacavir-lamiVUDine (EPZICOM) 600-300 MG per tablet 1 tablet  Status:  Discontinued     1 tablet Oral Daily at bedtime 08/05/12 1834 08/05/12 1902   08/05/12 2200  dolutegravir (TIVICAY) tablet 50 mg     50 mg Oral Daily at bedtime 08/05/12 1834     08/05/12 2200  ceFEPIme (MAXIPIME) 1 g in dextrose 5 % 50 mL IVPB  Status:  Discontinued     1 g 100 mL/hr over 30 Minutes Intravenous 3 times per day 08/05/12 1834 08/05/12 1848   08/05/12 2200  abacavir (ZIAGEN) tablet 600 mg     600 mg Oral Daily at bedtime 08/05/12 1904     08/05/12 2200  lamiVUDine (EPIVIR) tablet 300 mg     300 mg Oral Daily at bedtime 08/05/12 1904     08/05/12 2000  clindamycin (CLEOCIN) IVPB 600 mg  Status:  Discontinued     600 mg 100 mL/hr over 30 Minutes Intravenous Every 8 hours 08/05/12 1834 08/09/12 1320   08/05/12 2000  primaquine tablet 30 mg     30 mg Oral Daily 08/05/12 1834     08/05/12 1800  vancomycin (VANCOCIN) IVPB 750 mg/150 ml premix  Status:  Discontinued     750 mg 150 mL/hr over 60 Minutes Intravenous Every 12 hours 08/05/12 1635 08/06/12 0900   08/05/12 1630  ceFEPIme (MAXIPIME) 1 g in dextrose 5 % 50 mL IVPB  Status:  Discontinued     1 g 100 mL/hr over 30  Minutes Intravenous Every 8 hours 08/05/12 1602 08/06/12 1556   08/05/12 1600  cefTRIAXone (ROCEPHIN) 1 g in dextrose 5 % 50 mL IVPB  Status:  Discontinued     1 g 100 mL/hr over 30 Minutes Intravenous  Once 08/05/12 1558 08/05/12 1559   08/05/12 1600  azithromycin (ZITHROMAX) 500 mg in dextrose 5 % 250 mL IVPB  Status:  Discontinued     500 mg 250 mL/hr over 60 Minutes Intravenous  Once 08/05/12 1558 08/05/12 1559      Medications: Scheduled Meds: . abacavir  600 mg Oral QHS   And  . lamiVUDine  300 mg Oral QHS  . acetaminophen  1,000 mg Oral BID  . antiseptic oral rinse  15 mL Mouth Rinse BID  . aspirin  81 mg Oral q morning - 10a  . cephALEXin  500 mg Oral QID  . clindamycin  450 mg Oral Q6H  . dolutegravir  50 mg Oral QHS  . enoxaparin (LOVENOX) injection  40 mg Subcutaneous Q24H  . feeding supplement  237 mL Oral  BID BM  . ferrous sulfate  650 mg Oral Q breakfast  . insulin aspart  0-20 Units Subcutaneous Q4H  . levothyroxine  175 mcg Oral QAC breakfast  . loratadine  10 mg Oral Daily  . metoCLOPramide  5 mg Oral TID AC  . nystatin   Topical TID  . omega-3 acid ethyl esters  2 g Oral BID  . pantoprazole  40 mg Oral Daily  . predniSONE  40 mg Oral BID  . primaquine  30 mg Oral Daily  . simvastatin  40 mg Oral QHS  . sodium bicarbonate  650 mg Oral q morning - 10a  . testosterone  5 g Transdermal q morning - 10a  . triamcinolone cream   Topical BID  . venlafaxine XR  112.5 mg Oral q morning - 10a   Continuous Infusions: . sodium chloride 100 mL/hr at 08/09/12 1031  . insulin pump     PRN Meds:.dextrose, hydrOXYzine, ondansetron, traMADol   Objective: Weight change:   Intake/Output Summary (Last 24 hours) at 08/09/12 1658 Last data filed at 08/09/12 1600  Gross per 24 hour  Intake   2380 ml  Output   2100 ml  Net    280 ml   Blood pressure 130/76, pulse 84, temperature 98.2 F (36.8 C), temperature source Oral, resp. rate 14, height 5\' 10"  (1.778 m), weight  174 lb 6.1 oz (79.1 kg), SpO2 94.00%. Temp:  [96.2 F (35.7 C)-98.2 F (36.8 C)] 98.2 F (36.8 C) (07/06 1430) Pulse Rate:  [77-97] 84 (07/06 1430) Resp:  [11-18] 14 (07/06 1430) BP: (130-145)/(57-90) 130/76 mmHg (07/06 1430) SpO2:  [92 %-96 %] 94 % (07/06 1430)  Physical Exam: General: Alert and awake, oriented x3, not in any acute distress.  HEENT: anicteric sclera, pupils reactive to light and accommodation, EOMI, oropharynx clear and without exudate  CVS regular rate, normal r, no murmur rubs or gallops  Chest:course breath sounds throughout with prolonged expiratory phase  Abdomen: soft nontender, nondistended, normal bowel sounds,  Extremities: no clubbing or edema noted bilaterally  Skin: areas of excoriation, right groin with moist area with skin breakdown c/w intertrigo, neck with rash maculopapular chronic Neuro: nonfocal, strength and sensation intact   Lab Results:  Recent Labs  08/08/12 0345 08/09/12 0350  WBC 14.1* 17.3*  HGB 7.4* 8.5*  HCT 22.5* 26.1*  PLT 375 446*    BMET  Recent Labs  08/08/12 0345 08/09/12 0350  NA 135 136  K 4.3 3.8  CL 105 106  CO2 20 22  GLUCOSE 111* 123*  BUN 46* 46*  CREATININE 1.24 1.23  CALCIUM 9.9 10.7*    Micro Results: Recent Results (from the past 240 hour(s))  WOUND CULTURE     Status: None   Collection Time    08/03/12  4:17 PM      Result Value Range Status   Culture Few STAPHYLOCOCCUS AUREUS   Final   Comment: RIGHT GROIN   GRAM STAIN No WBC Seen   Final   GRAM STAIN No Squamous Epithelial Cells Seen   Final   GRAM STAIN Moderate Gram Positive Cocci In Pairs In Clusters   Final   Organism ID, Bacteria STAPHYLOCOCCUS AUREUS   Final   Comment: Rifampin and Gentamicin should not be used as     single drugs for treatment of Staph infections.     This organism is presumed to be Clindamycin     resistant based on detection of inducible     Clindamycin  resistance.  CULTURE, BLOOD (ROUTINE X 2)     Status:  None   Collection Time    08/05/12  3:56 PM      Result Value Range Status   Specimen Description BLOOD RIGHT ARM   Final   Special Requests BOTTLES DRAWN AEROBIC AND ANAEROBIC 6CC   Final   Culture  Setup Time 08/05/2012 21:02   Final   Culture     Final   Value:        BLOOD CULTURE RECEIVED NO GROWTH TO DATE CULTURE WILL BE HELD FOR 5 DAYS BEFORE ISSUING A FINAL NEGATIVE REPORT   Report Status PENDING   Incomplete  CULTURE, BLOOD (ROUTINE X 2)     Status: None   Collection Time    08/05/12  4:30 PM      Result Value Range Status   Specimen Description BLOOD LEFT ARM   Final   Special Requests BOTTLES DRAWN AEROBIC AND ANAEROBIC 5CC   Final   Culture  Setup Time 08/05/2012 21:03   Final   Culture     Final   Value:        BLOOD CULTURE RECEIVED NO GROWTH TO DATE CULTURE WILL BE HELD FOR 5 DAYS BEFORE ISSUING A FINAL NEGATIVE REPORT   Report Status PENDING   Incomplete  URINE CULTURE     Status: None   Collection Time    08/05/12  6:00 PM      Result Value Range Status   Specimen Description URINE, CLEAN CATCH   Final   Special Requests NONE   Final   Culture  Setup Time 08/06/2012 01:49   Final   Colony Count NO GROWTH   Final   Culture NO GROWTH   Final   Report Status 08/07/2012 FINAL   Final  MRSA PCR SCREENING     Status: None   Collection Time    08/05/12  6:48 PM      Result Value Range Status   MRSA by PCR NEGATIVE  NEGATIVE Final   Comment:            The GeneXpert MRSA Assay (FDA     approved for NASAL specimens     only), is one component of a     comprehensive MRSA colonization     surveillance program. It is not     intended to diagnose MRSA     infection nor to guide or     monitor treatment for     MRSA infections.  AFB CULTURE, BLOOD     Status: None   Collection Time    08/05/12  7:54 PM      Result Value Range Status   Specimen Description BLOOD RIGHT ARM   Final   Special Requests BOTTLES DRAWN AEROBIC AND ANAEROBIC 5ML   Final   Culture      Final   Value: CULTURE WILL BE EXAMINED FOR 6 WEEKS BEFORE ISSUING A FINAL REPORT   Report Status PENDING   Incomplete    Studies/Results: No results found.    Assessment/Plan: DONIELLE HECKEL is a 42 y.o. male with HIV/AIDS, admitted with what clinically seems HIGHLY consistent with PCP pneumonia   #1 PCP Pneuonia   --continue clinda/primaquin but convert to all orals and change steroids to prednisone 40mg  po bid --O2  And will likely need this at home  #2 HIV/AIDS: continue Tivicay/Epzicom , note Tivicay can have very slight effect on serum creatinine as well via OCT transporter --  he also needs weekly azithromycin  I spent greater than 45 minutes with the patient including greater than 50% of time in face to face counsel of the patient and in coordination of their care.   #3 Hyperkalemia: I dc'd his ARB and I DO NOT SEE WHY he should be on this with hx of hyperK requiring lasix and weekly kayex. I also dc'd lasix for now to ensure he is being hydrated well . DC weekly kayexylate and this is resolved  #4 CKD: her creatinine is much improved without ARB on board and absent lasix  #4 Pruritis: may be related to HIV itself I added claritin daily and escalated  his atarax dose  #5 Intertrigo: start topical nystatin  #6 Neck rash: try topical nystatin  #7 Loose stools: if >5bm, check c difficile pcr  #8 blood with loose stool: sounds likely related to hemorrhoids, or skin irritation with bleeding and blood--> stool, no urgent need for GI workup with colonscopy etc. Hgb is lower than June but stable  Dr Johnnye Sima is here tomorrow.      LOS: 4 days   Alcide Evener 08/09/2012, 4:58 PM

## 2012-08-09 NOTE — Progress Notes (Signed)
Pt on lovenox 40mg . Start 08/05/12

## 2012-08-09 NOTE — Progress Notes (Signed)
Pt adjusts his insulin pump himself.

## 2012-08-09 NOTE — Progress Notes (Addendum)
[facility] PROGRESS NOTE  NORMAL GRESS [id number] DOB: 03/05/1970 DOA: 08/05/2012 PCP: Redge Gainer, MD  Brief narrative: KANE FETTERHOFF is an 42 y.o. male with a PMH of recently diagnosed HIV 05/2012, CD4 57 on 06/19/12, on Tivicay and epzicom, seen in ID clinic 08/05/12 by Dr. Linus Salmons and sent to the ER for further evaluation secondary to hypoxia, fever, malaise and generalized weakness.  He is being treated for PCP pneumonia.  Assessment/Plan: Principal Problem:  Sepsis secondary to HCAP (healthcare-associated pneumonia) with acute hypoxic respiratory failure and hypoxia  -Given immunocompromise and recent hospitalization, he was initially covered for both HCAP and PCP.  -Discussed case with Dr. Tommy Medal 08/05/12 who recommended clindamycin and Primaquin for PCP and cefepime/vancomycin for HCAP. Dr. Tommy Medal evaluated the patient on 08/07/12 and discontinued Cefepime and Vancomycin, as presentation most consistent with PCP.  -F/U Blood cultures/AFB blood cultures.  -Strep pneumonia andLegionella antigens negative.  -Will transfer out of step down unit. Sats 92-94%.  Initially on NRB mask, now on nasal cannula at 7 L/min. -Continue supplemental oxygen as needed to keep oxygen saturations greater than 92%.  -Continue Solu-Medrol 40 mg IV every 6 hours. Would not taper yet due to ongoing high oxygen requirement. Active Problems: Hypercalcemia -Calcium level elevated today.  ? Secondary to dehydration from osmotic diuresis. -Increase IVF rate and monitor. Right groin wound -Cultures from 08/03/12 grew staph aureus. -Resume Keflex (Clinda resistant). Hyperkalemia -ARB d/c'd.  Chronic kidney disease, stage 3, mod decreased GFR  -Baseline creatinine 1.6-1.8. Current creatinine improved over usual baseline values.  DM (diabetes mellitus), type 1, uncontrolled  -Given marked elevation of blood glucoses, insulin pump d/c'd and the patient was placed on an insulin drip per glucommander  protocol. -No evidence of DKA.  CBGs now 119-515. -Currently on insulin pump and insulin resistant SSI Q 4 hours.   Hypothyroid  -Continue home dose of Synthroid.  Chronic normocytic anemia, iron deficiency  -Continue iron therapy. Also receives Aranesp as an outpatient.  Gastroparesis  -Continue Reglan.  Sinus tachycardia  -Likely from hypoxia. Improved on oxygen. HIV disease  -Continue HIV therapy. CD4 count increased to 70 (from 20 06/19/12).  Hyponatremia  -Resolved with correction of blood glucoses.   Code Status: Full.  Family Communication: No family currently at the bedside.  Disposition Plan: Home when stable.  Medical Consultants:  Dr. Rhina Brackett [place], [place]  Other Consultants:  Diabetes coordinator  Anti-infectives:  Cefipime 08/05/12--->08/06/12  Vancomycin 08/05/12--->08/06/12  Clindamycin 08/05/12--->  Primaquine 08/05/12--->  Keflex 08/09/12--->   HPI/Subjective: CALEX QUIRINDONGO continues to report improvement in dyspnea/cough.  No chest pain, nausea or vomiting.  Continues to report some bloody diarrhea.  Pruritis improved.  Objective: Filed Vitals:   08/08/12 1600 08/08/12 2000 08/09/12 0000 08/09/12 0400  BP: 145/78 140/76 143/88 145/90  Pulse: 84 97 87 77  Temp: 97.6 F (36.4 C) 97.5 F (36.4 C) 97.3 F (36.3 C) 97.5 F (36.4 C)  TempSrc: Oral Oral Oral Oral  Resp: 16 17 18 16   Height:      Weight:      SpO2: 93% 93% 96% 95%    Intake/Output Summary (Last 24 hours) at 08/09/12 0658 Last data filed at 08/09/12 0600  Gross per 24 hour  Intake 1505.2 ml  Output   2250 ml  Net -744.8 ml    Exam: Gen:  NAD Cardiovascular:  RRR, No M/R/G Respiratory:  Diminished breath sounds bilaterally Gastrointestinal:  Abdomen soft, NT/ND, + BS Extremities:  No C/E/C  Data Reviewed: Basic Metabolic Panel:  Recent Labs Lab 08/07/12 0420 08/07/12 0807 08/07/12 1224 08/08/12 0345 08/09/12 0350  NA 133* 134* 131* 135 136  K 4.4 4.3 3.7 4.3 3.8  CL  101 102 98 105 106  CO2 23 24 21 20 22   GLUCOSE 169* 155* 255* 111* 123*  BUN 52* 51* 50* 46* 46*  CREATININE 1.54* 1.44* 1.49* 1.24 1.23  CALCIUM 9.7 9.8 10.5 9.9 10.7*   GFR Estimated Creatinine Clearance: 80 ml/min (by C-G formula based on Cr of 1.23). Liver Function Tests:  Recent Labs Lab 08/05/12 1630  AST 13  ALT 9  ALKPHOS 102  BILITOT 0.3  PROT 6.4  ALBUMIN 2.3*   CBC:  Recent Labs Lab 08/05/12 1630 08/06/12 0341 08/06/12 1221 08/07/12 0420 08/08/12 0345 08/09/12 0350  WBC 14.5* 10.8*  --  14.4* 14.1* 17.3*  NEUTROABS 9.8*  --   --   --   --   --   HGB 8.9* 8.4* 8.6* 8.2* 7.4* 8.5*  HCT 28.0* 25.9* 27.2* 24.6* 22.5* 26.1*  MCV 89.7 88.4  --  87.5 87.5 88.5  PLT 327 312  --  354 375 446*   BNP (last 3 results)  Recent Labs  04/16/12 2020 04/16/12 2220  PROBNP 66.5 94.3   CBG:  Recent Labs Lab 08/08/12 1154 08/08/12 1728 08/08/12 2009 08/09/12 0044 08/09/12 0400  GLUCAP 345* 493* 515* 256* 119*   Microbiology Recent Results (from the past 240 hour(s))  WOUND CULTURE     Status: None   Collection Time    08/03/12  4:17 PM      Result Value Range Status   Culture Few STAPHYLOCOCCUS AUREUS   Final   Comment: RIGHT GROIN   GRAM STAIN No WBC Seen   Final   GRAM STAIN No Squamous Epithelial Cells Seen   Final   GRAM STAIN Moderate Gram Positive Cocci In Pairs In Clusters   Final   Organism ID, Bacteria STAPHYLOCOCCUS AUREUS   Final   Comment: Rifampin and Gentamicin should not be used as     single drugs for treatment of Staph infections.     This organism is presumed to be Clindamycin     resistant based on detection of inducible     Clindamycin resistance.  CULTURE, BLOOD (ROUTINE X 2)     Status: None   Collection Time    08/05/12  3:56 PM      Result Value Range Status   Specimen Description BLOOD RIGHT ARM   Final   Special Requests BOTTLES DRAWN AEROBIC AND ANAEROBIC 6CC   Final   Culture  Setup Time 08/05/2012 21:02   Final    Culture     Final   Value:        BLOOD CULTURE RECEIVED NO GROWTH TO DATE CULTURE WILL BE HELD FOR 5 DAYS BEFORE ISSUING A FINAL NEGATIVE REPORT   Report Status PENDING   Incomplete  CULTURE, BLOOD (ROUTINE X 2)     Status: None   Collection Time    08/05/12  4:30 PM      Result Value Range Status   Specimen Description BLOOD LEFT ARM   Final   Special Requests BOTTLES DRAWN AEROBIC AND ANAEROBIC 5CC   Final   Culture  Setup Time 08/05/2012 21:03   Final   Culture     Final   Value:        BLOOD CULTURE RECEIVED NO GROWTH TO DATE CULTURE WILL BE  HELD FOR 5 DAYS BEFORE ISSUING A FINAL NEGATIVE REPORT   Report Status PENDING   Incomplete  URINE CULTURE     Status: None   Collection Time    08/05/12  6:00 PM      Result Value Range Status   Specimen Description URINE, CLEAN CATCH   Final   Special Requests NONE   Final   Culture  Setup Time 08/06/2012 01:49   Final   Colony Count NO GROWTH   Final   Culture NO GROWTH   Final   Report Status 08/07/2012 FINAL   Final  MRSA PCR SCREENING     Status: None   Collection Time    08/05/12  6:48 PM      Result Value Range Status   MRSA by PCR NEGATIVE  NEGATIVE Final   Comment:            The GeneXpert MRSA Assay (FDA     approved for NASAL specimens     only), is one component of a     comprehensive MRSA colonization     surveillance program. It is not     intended to diagnose MRSA     infection nor to guide or     monitor treatment for     MRSA infections.  AFB CULTURE, BLOOD     Status: None   Collection Time    08/05/12  7:54 PM      Result Value Range Status   Specimen Description BLOOD RIGHT ARM   Final   Special Requests BOTTLES DRAWN AEROBIC AND ANAEROBIC 5ML   Final   Culture     Final   Value: CULTURE WILL BE EXAMINED FOR 6 WEEKS BEFORE ISSUING A FINAL REPORT   Report Status PENDING   Incomplete     Procedures and Diagnostic Studies: Dg Chest 2 View  08/05/2012   *RADIOLOGY REPORT*  Clinical Data: Shortness of  breath.  Poor oxygen saturation.  CHEST - 2 VIEW  Comparison: 07/08/2012 and multiple previous  Findings: Artifact overlies chest.  Heart size is normal.  There are patchy infiltrates throughout both lungs, most pronounced at the lung bases, consistent with pneumonia.  No effusions.  Old compression deformities are seen in the upper thoracic region.  IMPRESSION: Widespread bilateral bronchopneumonia most pronounced in the lower lobes.   Original Report Authenticated By: Nelson Chimes, M.D.    Scheduled Meds: . abacavir  600 mg Oral QHS   And  . lamiVUDine  300 mg Oral QHS  . acetaminophen  1,000 mg Oral BID  . antiseptic oral rinse  15 mL Mouth Rinse BID  . aspirin  81 mg Oral q morning - 10a  . clindamycin (CLEOCIN) IV  600 mg Intravenous Q8H  . dolutegravir  50 mg Oral QHS  . enoxaparin (LOVENOX) injection  40 mg Subcutaneous Q24H  . feeding supplement  237 mL Oral BID BM  . ferrous sulfate  650 mg Oral Q breakfast  . insulin aspart  0-20 Units Subcutaneous Q4H  . levothyroxine  175 mcg Oral QAC breakfast  . loratadine  10 mg Oral Daily  . methylPREDNISolone (SOLU-MEDROL) injection  40 mg Intravenous Q6H  . metoCLOPramide  5 mg Oral TID AC  . omega-3 acid ethyl esters  2 g Oral BID  . pantoprazole  40 mg Oral Daily  . primaquine  30 mg Oral Daily  . simvastatin  40 mg Oral QHS  . sodium bicarbonate  650 mg Oral q morning -  10a  . testosterone  5 g Transdermal q morning - 10a  . venlafaxine XR  112.5 mg Oral q morning - 10a   Continuous Infusions: . sodium chloride 50 mL/hr at 08/08/12 0901  . insulin pump      Time spent: 35 minutes with greater than 50% of time spent counseling the patient  on his test results, diagnostic impression, and plan of care.   LOS: 4 days   [facility] Pager [phone].   *Please note that the hospitalists switch teams on Wednesdays. Please call the flow manager at [phone] if you are having difficulty reaching the hospitalist  taking care of this patient as she can update you and provide the most up-to-date pager number of provider caring for the patient. If 8PM-8AM, please contact night-coverage at [email or web address], password [facility]  08/09/2012, 6:58 AM

## 2012-08-10 ENCOUNTER — Encounter (HOSPITAL_COMMUNITY): Payer: BC Managed Care – PPO

## 2012-08-10 ENCOUNTER — Other Ambulatory Visit: Payer: BC Managed Care – PPO

## 2012-08-10 LAB — GLUCOSE, CAPILLARY: Glucose-Capillary: 109 mg/dL — ABNORMAL HIGH (ref 70–99)

## 2012-08-10 LAB — BASIC METABOLIC PANEL
BUN: 42 mg/dL — ABNORMAL HIGH (ref 6–23)
Calcium: 10.6 mg/dL — ABNORMAL HIGH (ref 8.4–10.5)
Creatinine, Ser: 1.12 mg/dL (ref 0.50–1.35)
GFR calc non Af Amer: 79 mL/min — ABNORMAL LOW (ref >90)
Glucose, Bld: 90 mg/dL (ref 70–99)
Potassium: 4.1 mEq/L (ref 3.5–5.1)

## 2012-08-10 LAB — CBC
Hemoglobin: 8.1 g/dL — ABNORMAL LOW (ref 13.0–17.0)
MCH: 29.7 pg (ref 26.0–34.0)
MCHC: 33.9 g/dL (ref 30.0–36.0)

## 2012-08-10 MED ORDER — INSULIN ASPART 100 UNIT/ML ~~LOC~~ SOLN
0.0000 [IU] | SUBCUTANEOUS | Status: DC
  Administered 2012-08-10: 5 [IU] via SUBCUTANEOUS

## 2012-08-10 MED ORDER — FLUCONAZOLE IN SODIUM CHLORIDE 400-0.9 MG/200ML-% IV SOLN
400.0000 mg | INTRAVENOUS | Status: DC
  Administered 2012-08-10 – 2012-08-11 (×2): 400 mg via INTRAVENOUS
  Filled 2012-08-10 (×3): qty 200

## 2012-08-10 MED ORDER — INSULIN PUMP
Freq: Three times a day (TID) | SUBCUTANEOUS | Status: DC
  Administered 2012-08-10: 7.5 via SUBCUTANEOUS
  Administered 2012-08-10: 3 via SUBCUTANEOUS
  Administered 2012-08-11 – 2012-08-12 (×6): via SUBCUTANEOUS
  Filled 2012-08-10: qty 1

## 2012-08-10 MED ORDER — INSULIN ASPART 100 UNIT/ML ~~LOC~~ SOLN
0.0000 [IU] | Freq: Three times a day (TID) | SUBCUTANEOUS | Status: DC
  Administered 2012-08-10: 3 [IU] via SUBCUTANEOUS
  Administered 2012-08-11: 8 [IU] via SUBCUTANEOUS
  Administered 2012-08-11: 5 [IU] via SUBCUTANEOUS
  Administered 2012-08-12 (×2): 3 [IU] via SUBCUTANEOUS

## 2012-08-10 MED ORDER — INSULIN ASPART 100 UNIT/ML ~~LOC~~ SOLN
0.0000 [IU] | Freq: Every day | SUBCUTANEOUS | Status: DC
  Administered 2012-08-11: 2 [IU] via SUBCUTANEOUS

## 2012-08-10 MED ORDER — TRAMADOL HCL 50 MG PO TABS
50.0000 mg | ORAL_TABLET | ORAL | Status: DC | PRN
  Administered 2012-08-10 – 2012-08-12 (×4): 50 mg via ORAL
  Filled 2012-08-10 (×4): qty 1

## 2012-08-10 MED ORDER — FLUCONAZOLE IN SODIUM CHLORIDE 200-0.9 MG/100ML-% IV SOLN
200.0000 mg | Freq: Every day | INTRAVENOUS | Status: DC
  Filled 2012-08-10: qty 100

## 2012-08-10 MED ORDER — DARBEPOETIN ALFA-POLYSORBATE 150 MCG/0.3ML IJ SOLN
150.0000 ug | Freq: Once | INTRAMUSCULAR | Status: AC
  Administered 2012-08-10: 150 ug via SUBCUTANEOUS
  Filled 2012-08-10: qty 0.3

## 2012-08-10 NOTE — Progress Notes (Signed)
Patient was given orange juice at 0745 this am for CBG of 43,CBG up to 95.-Will continue to monitor the patient.- Sandie Ano RN

## 2012-08-10 NOTE — Progress Notes (Signed)
Patient's O2 level checked on ambulation, it was 85%, at rest on 6L of O2 - 93%.Sandie Ano RN

## 2012-08-10 NOTE — Progress Notes (Signed)
Inpatient Diabetes Program Recommendations  AACE/ADA: New Consensus Statement on Inpatient Glycemic Control (2013)  Target Ranges:  Prepandial:   less than 140 mg/dL      Peak postprandial:   less than 180 mg/dL (1-2 hours)      Critically ill patients:  140 - 180 mg/dL   Reason for Visit: Follow-up regarding insulin pump   Note:  Had attempted to see patient this morning, but he had started bathing.  When I talked with him this afternoon discovered that he is using his bolus wizard to determine boluses and then has gotten injections when CBG has gotten elevated.  Therefore, has some piggybacking of insulin last night after supper that possibly contributed to hypoglycemia.  Discussed with Dr. Rockne Menghini.  CBG's changed to ac and HS.  Nursing staff will inform him of correction insulin dose based on moderate correction scale at meals and more conservative HS scale at HS.  Patient will manually calculate his CHO coverage, and add this to the correction dose for his total bolus amount.  Patient verbalizes understanding.  Thank you.  Niveah Boerner S. Marcelline Mates, RN, CNS, CDE Inpatient Diabetes Program, team pager [phone]

## 2012-08-10 NOTE — Progress Notes (Signed)
CRITICAL VALUE ALERT  Critical value received:  43  Date of notification:  08/10/12  Time of notification:  0810     Critical value read back:yes   Nurse who received alert:  Sandie Ano  MD notified (1st page):  Dr. Rockne Menghini  Time of first page: 0810  MD notified (2nd page):  Time of second page:  Responding MD:  Dr. Rockne Menghini Time MD responded:  [phone]

## 2012-08-10 NOTE — Progress Notes (Signed)
[facility] PROGRESS NOTE  Patrick Brown [id number] DOB: [birth year] DOA: 08/05/2012 PCP: Redge Gainer, MD  Brief narrative: Patrick Brown is an 42 y.o. male with a PMH of recently diagnosed HIV 05/2012, CD4 3 on 06/19/12, on Tivicay and epzicom, seen in ID clinic 08/05/12 by Dr. Linus Salmons and sent to the ER for further evaluation secondary to hypoxia, fever, malaise and generalized weakness.  He is being treated for PCP pneumonia.  Assessment/Plan: Principal Problem:  Sepsis secondary to HCAP (healthcare-associated pneumonia) with acute hypoxic respiratory failure and hypoxia  -Given immunocompromise and recent hospitalization, he was initially covered for both HCAP and PCP.  -Discussed case with Dr. Tommy Medal 08/05/12 who recommended clindamycin and Primaquin for PCP and cefepime/vancomycin for HCAP. Dr. Tommy Medal evaluated the patient on 08/07/12 and discontinued Cefepime and Vancomycin, as presentation most consistent with PCP.  -F/U Blood cultures/AFB blood cultures, which are negative to date.  -Strep pneumonia andLegionella antigens negative.  -Sats 92-97%.  Initially on NRB mask, now on nasal cannula at which we are attempting to wean as tolerated. -Initially treated with Solu-Medrol 40 mg IV every 6 hours. Now on prednisone 40 mg twice a day. Active Problems: Diarrhea -Followup C. difficile PCR studies. We'll send GI pathogen profile and stool for ova and parasites as well as fecal lactoferrin. Hypercalcemia -Calcium remained elevated in the setting of low albumin. Check intact PTH. Right groin wound with intertrigo -Cultures from 08/03/12 grew staph aureus. -Continue Keflex (Clinda resistant). -On nystatin cream for perirectal candidiasis. We'll add IV Diflucan. Hyperkalemia -ARB d/c'd.  Chronic kidney disease, stage 3, mod decreased GFR  -Baseline creatinine 1.6-1.8. Current creatinine improved over usual baseline values.  DM (diabetes mellitus), type 1, uncontrolled  -Given  marked elevation of blood glucoses, insulin pump d/c'd and the patient was placed on an insulin drip per glucommander protocol. -No evidence of DKA.  CBGs remained somewhat labile. -Currently on insulin pump and insulin resistant SSI Q 4 hours.  Given low blood glucose reading this morning, will change to moderate insulin sensitive scale. Hypothyroid  -Continue home dose of Synthroid.  Chronic normocytic anemia, iron deficiency  -Continue iron therapy. Also receives Aranesp as an outpatient. We'll give his usual dose here in the hospital. Gastroparesis  -Continue Reglan.  Sinus tachycardia  -Likely from hypoxia. Improved on oxygen. HIV disease  -Continue HIV therapy. CD4 count increased to 70 (from 20 06/19/12).  Hyponatremia  -Resolved with correction of blood glucoses.   Code Status: Full.  Family Communication: No family currently at the bedside.  Disposition Plan: Home when stable.  Medical Consultants:  Dr. Rhina Brackett [place], [place]  Other Consultants:  Diabetes coordinator  Anti-infectives:  Cefipime 08/05/12--->08/06/12  Vancomycin 08/05/12--->08/06/12  Clindamycin 08/05/12--->  Primaquine 08/05/12--->  Keflex 08/09/12--->   HPI/Subjective: Patrick Brown continues to report improvement in dyspnea/cough.  No chest pain, nausea or vomiting.  Continues to report frequent bloody diarrhea. Has fecal urgency with inability to get to the bathroom in time and soiling of his undergarments.   Pruritis improved. Continues to have excoriation to the groin and perirectal tissue.  Objective: Filed Vitals:   08/09/12 0800 08/09/12 1430 08/09/12 2100 08/10/12 0500  BP: 138/57 130/76 130/77 121/63  Pulse: 84 84 89 82  Temp: 96.2 F (35.7 C) 98.2 F (36.8 C) 97.9 F (36.6 C) 98.9 F (37.2 C)  TempSrc: Oral  Oral Oral  Resp: 11 14 18 16   Height:      Weight:  SpO2: 92% 94% 95% 97%    Intake/Output Summary (Last 24 hours) at 08/10/12 1050 Last data filed at 08/10/12 [id number]  Gross  per 24 hour  Intake   3005 ml  Output   1600 ml  Net   1405 ml    Exam: Gen:  NAD Cardiovascular:  RRR, No M/R/G Respiratory:  Diminished breath sounds bilaterally Gastrointestinal:  Abdomen soft, NT/ND, + BS Extremities:  No C/E/C  Data Reviewed: Basic Metabolic Panel:  Recent Labs Lab 08/07/12 0807 08/07/12 1224 08/08/12 0345 08/09/12 0350 08/10/12 0430  NA 134* 131* 135 136 135  K 4.3 3.7 4.3 3.8 4.1  CL 102 98 105 106 108  CO2 24 21 20 22 19   GLUCOSE 155* 255* 111* 123* 90  BUN 51* 50* 46* 46* 42*  CREATININE 1.44* 1.49* 1.24 1.23 1.12  CALCIUM 9.8 10.5 9.9 10.7* 10.6*   GFR Estimated Creatinine Clearance: 87.8 ml/min (by C-G formula based on Cr of 1.12). Liver Function Tests:  Recent Labs Lab 08/05/12 1630  AST 13  ALT 9  ALKPHOS 102  BILITOT 0.3  PROT 6.4  ALBUMIN 2.3*   CBC:  Recent Labs Lab 08/05/12 1630 08/06/12 0341 08/06/12 1221 08/07/12 0420 08/08/12 0345 08/09/12 0350 08/10/12 0430  WBC 14.5* 10.8*  --  14.4* 14.1* 17.3* 16.5*  NEUTROABS 9.8*  --   --   --   --   --   --   HGB 8.9* 8.4* 8.6* 8.2* 7.4* 8.5* 8.1*  HCT 28.0* 25.9* 27.2* 24.6* 22.5* 26.1* 23.9*  MCV 89.7 88.4  --  87.5 87.5 88.5 87.5  PLT 327 312  --  354 375 446* 413*   BNP (last 3 results)  Recent Labs  04/16/12 2020 04/16/12 2220  PROBNP 66.5 94.3   CBG:  Recent Labs Lab 08/10/12 0019 08/10/12 0404 08/10/12 0741 08/10/12 0809 08/10/12 1003  GLUCAP 312* 109* 43* 95 207*   Microbiology Recent Results (from the past 240 hour(s))  WOUND CULTURE     Status: None   Collection Time    08/03/12  4:17 PM      Result Value Range Status   Culture Few STAPHYLOCOCCUS AUREUS   Final   Comment: RIGHT GROIN   GRAM STAIN No WBC Seen   Final   GRAM STAIN No Squamous Epithelial Cells Seen   Final   GRAM STAIN Moderate Gram Positive Cocci In Pairs In Clusters   Final   Organism ID, Bacteria STAPHYLOCOCCUS AUREUS   Final   Comment: Rifampin and Gentamicin should  not be used as     single drugs for treatment of Staph infections.     This organism is presumed to be Clindamycin     resistant based on detection of inducible     Clindamycin resistance.  CULTURE, BLOOD (ROUTINE X 2)     Status: None   Collection Time    08/05/12  3:56 PM      Result Value Range Status   Specimen Description BLOOD RIGHT ARM   Final   Special Requests BOTTLES DRAWN AEROBIC AND ANAEROBIC 6CC   Final   Culture  Setup Time 08/05/2012 21:02   Final   Culture     Final   Value:        BLOOD CULTURE RECEIVED NO GROWTH TO DATE CULTURE WILL BE HELD FOR 5 DAYS BEFORE ISSUING A FINAL NEGATIVE REPORT   Report Status PENDING   Incomplete  CULTURE, BLOOD (ROUTINE X 2)  Status: None   Collection Time    08/05/12  4:30 PM      Result Value Range Status   Specimen Description BLOOD LEFT ARM   Final   Special Requests BOTTLES DRAWN AEROBIC AND ANAEROBIC 5CC   Final   Culture  Setup Time 08/05/2012 21:03   Final   Culture     Final   Value:        BLOOD CULTURE RECEIVED NO GROWTH TO DATE CULTURE WILL BE HELD FOR 5 DAYS BEFORE ISSUING A FINAL NEGATIVE REPORT   Report Status PENDING   Incomplete  URINE CULTURE     Status: None   Collection Time    08/05/12  6:00 PM      Result Value Range Status   Specimen Description URINE, CLEAN CATCH   Final   Special Requests NONE   Final   Culture  Setup Time 08/06/2012 01:49   Final   Colony Count NO GROWTH   Final   Culture NO GROWTH   Final   Report Status 08/07/2012 FINAL   Final  MRSA PCR SCREENING     Status: None   Collection Time    08/05/12  6:48 PM      Result Value Range Status   MRSA by PCR NEGATIVE  NEGATIVE Final   Comment:            The GeneXpert MRSA Assay (FDA     approved for NASAL specimens     only), is one component of a     comprehensive MRSA colonization     surveillance program. It is not     intended to diagnose MRSA     infection nor to guide or     monitor treatment for     MRSA infections.  AFB  CULTURE, BLOOD     Status: None   Collection Time    08/05/12  7:54 PM      Result Value Range Status   Specimen Description BLOOD RIGHT ARM   Final   Special Requests BOTTLES DRAWN AEROBIC AND ANAEROBIC 5ML   Final   Culture     Final   Value: CULTURE WILL BE EXAMINED FOR 6 WEEKS BEFORE ISSUING A FINAL REPORT   Report Status PENDING   Incomplete     Procedures and Diagnostic Studies: Dg Chest 2 View  08/05/2012   *RADIOLOGY REPORT*  Clinical Data: Shortness of breath.  Poor oxygen saturation.  CHEST - 2 VIEW  Comparison: 07/08/2012 and multiple previous  Findings: Artifact overlies chest.  Heart size is normal.  There are patchy infiltrates throughout both lungs, most pronounced at the lung bases, consistent with pneumonia.  No effusions.  Old compression deformities are seen in the upper thoracic region.  IMPRESSION: Widespread bilateral bronchopneumonia most pronounced in the lower lobes.   Original Report Authenticated By: Nelson Chimes, M.D.    Scheduled Meds: . abacavir  600 mg Oral QHS   And  . lamiVUDine  300 mg Oral QHS  . acetaminophen  1,000 mg Oral BID  . antiseptic oral rinse  15 mL Mouth Rinse BID  . aspirin  81 mg Oral q morning - 10a  . [START ON 08/14/2012] azithromycin  1,200 mg Oral Weekly  . clindamycin  450 mg Oral Q6H  . dolutegravir  50 mg Oral QHS  . enoxaparin (LOVENOX) injection  40 mg Subcutaneous Q24H  . feeding supplement  237 mL Oral BID BM  . ferrous sulfate  650 mg  Oral Q breakfast  . fluconazole (DIFLUCAN) IV  200 mg Intravenous Daily  . insulin aspart  0-20 Units Subcutaneous Q4H  . levothyroxine  175 mcg Oral QAC breakfast  . loratadine  10 mg Oral Daily  . metoCLOPramide  5 mg Oral TID AC  . nystatin   Topical TID  . omega-3 acid ethyl esters  2 g Oral BID  . pantoprazole  40 mg Oral Daily  . predniSONE  40 mg Oral BID  . primaquine  30 mg Oral Daily  . simvastatin  40 mg Oral QHS  . sodium bicarbonate  650 mg Oral q morning - 10a  .  testosterone  5 g Transdermal q morning - 10a  . triamcinolone cream   Topical BID  . venlafaxine XR  112.5 mg Oral q morning - 10a   Continuous Infusions: . sodium chloride 100 mL/hr at 08/10/12 0818  . insulin pump      Time spent: 35 minutes with greater than 50% of time spent counseling the patient  on his test results, diagnostic impression, and plan of care.   LOS: 5 days   [facility] Pager [phone].   *Please note that the hospitalists switch teams on Wednesdays. Please call the flow manager at [phone] if you are having difficulty reaching the hospitalist taking care of this patient as she can update you and provide the most up-to-date pager number of provider caring for the patient. If 8PM-8AM, please contact night-coverage at [email or web address], password [facility]  08/10/2012, 10:50 AM

## 2012-08-10 NOTE — Progress Notes (Signed)
INFECTIOUS DISEASE PROGRESS NOTE  ID: Patrick Brown is a 42 y.o. male with  Principal Problem:   HCAP (healthcare-associated pneumonia) Active Problems:   Chronic kidney disease, stage 3, mod decreased GFR   DM (diabetes mellitus), type 1, uncontrolled   Hypothyroid   Anemia, iron deficiency   Gastroparesis   Sinus tachycardia   HIV disease   Acute respiratory failure with hypoxia   Hyponatremia   Hyperkalemia   Hypercalcemia   Right groin wound  Subjective: Improved sob, decreased cough. Continued pruritis of groin, peri-anal.  Having diarrhea with blood.   Abtx:  Anti-infectives   Start     Dose/Rate Route Frequency Ordered Stop   08/14/12 1000  azithromycin (ZITHROMAX) tablet 1,200 mg     1,200 mg Oral Weekly 08/09/12 1702     08/10/12 1200  fluconazole (DIFLUCAN) IVPB 400 mg     400 mg 100 mL/hr over 120 Minutes Intravenous Every 24 hours 08/10/12 1052     08/10/12 1100  fluconazole (DIFLUCAN) IVPB 200 mg  Status:  Discontinued     200 mg 100 mL/hr over 60 Minutes Intravenous Daily 08/10/12 1036 08/10/12 1052   08/09/12 1400  clindamycin (CLEOCIN) capsule 450 mg     450 mg Oral 4 times per day 08/09/12 1320     08/09/12 1000  cephALEXin (KEFLEX) capsule 500 mg  Status:  Discontinued     500 mg Oral 4 times daily 08/09/12 0701 08/09/12 1701   08/06/12 1800  vancomycin (VANCOCIN) IVPB 1000 mg/200 mL premix  Status:  Discontinued     1,000 mg 200 mL/hr over 60 Minutes Intravenous Every 12 hours 08/06/12 0900 08/06/12 1556   08/06/12 1000  dapsone tablet 100 mg  Status:  Discontinued     100 mg Oral  Every morning - 10a 08/05/12 1834 08/05/12 1919   08/05/12 2200  abacavir-lamiVUDine (EPZICOM) 600-300 MG per tablet 1 tablet  Status:  Discontinued     1 tablet Oral Daily at bedtime 08/05/12 1834 08/05/12 1902   08/05/12 2200  dolutegravir (TIVICAY) tablet 50 mg     50 mg Oral Daily at bedtime 08/05/12 1834     08/05/12 2200  ceFEPIme (MAXIPIME) 1 g in dextrose 5 %  50 mL IVPB  Status:  Discontinued     1 g 100 mL/hr over 30 Minutes Intravenous 3 times per day 08/05/12 1834 08/05/12 1848   08/05/12 2200  abacavir (ZIAGEN) tablet 600 mg     600 mg Oral Daily at bedtime 08/05/12 1904     08/05/12 2200  lamiVUDine (EPIVIR) tablet 300 mg     300 mg Oral Daily at bedtime 08/05/12 1904     08/05/12 2000  clindamycin (CLEOCIN) IVPB 600 mg  Status:  Discontinued     600 mg 100 mL/hr over 30 Minutes Intravenous Every 8 hours 08/05/12 1834 08/09/12 1320   08/05/12 2000  primaquine tablet 30 mg     30 mg Oral Daily 08/05/12 1834     08/05/12 1800  vancomycin (VANCOCIN) IVPB 750 mg/150 ml premix  Status:  Discontinued     750 mg 150 mL/hr over 60 Minutes Intravenous Every 12 hours 08/05/12 1635 08/06/12 0900   08/05/12 1630  ceFEPIme (MAXIPIME) 1 g in dextrose 5 % 50 mL IVPB  Status:  Discontinued     1 g 100 mL/hr over 30 Minutes Intravenous Every 8 hours 08/05/12 1602 08/06/12 1556   08/05/12 1600  cefTRIAXone (ROCEPHIN) 1 g in dextrose 5 %  50 mL IVPB  Status:  Discontinued     1 g 100 mL/hr over 30 Minutes Intravenous  Once 08/05/12 1558 08/05/12 1559   08/05/12 1600  azithromycin (ZITHROMAX) 500 mg in dextrose 5 % 250 mL IVPB  Status:  Discontinued     500 mg 250 mL/hr over 60 Minutes Intravenous  Once 08/05/12 1558 08/05/12 1559      Medications:  Scheduled: . abacavir  600 mg Oral QHS   And  . lamiVUDine  300 mg Oral QHS  . acetaminophen  1,000 mg Oral BID  . antiseptic oral rinse  15 mL Mouth Rinse BID  . aspirin  81 mg Oral q morning - 10a  . [START ON 08/14/2012] azithromycin  1,200 mg Oral Weekly  . clindamycin  450 mg Oral Q6H  . dolutegravir  50 mg Oral QHS  . enoxaparin (LOVENOX) injection  40 mg Subcutaneous Q24H  . feeding supplement  237 mL Oral BID BM  . ferrous sulfate  650 mg Oral Q breakfast  . fluconazole (DIFLUCAN) IV  400 mg Intravenous Q24H  . insulin aspart  0-15 Units Subcutaneous TID WC  . insulin aspart  0-5 Units  Subcutaneous QHS  . levothyroxine  175 mcg Oral QAC breakfast  . loratadine  10 mg Oral Daily  . metoCLOPramide  5 mg Oral TID AC  . nystatin   Topical TID  . omega-3 acid ethyl esters  2 g Oral BID  . pantoprazole  40 mg Oral Daily  . predniSONE  40 mg Oral BID  . primaquine  30 mg Oral Daily  . simvastatin  40 mg Oral QHS  . sodium bicarbonate  650 mg Oral q morning - 10a  . testosterone  5 g Transdermal q morning - 10a  . triamcinolone cream   Topical BID  . venlafaxine XR  112.5 mg Oral q morning - 10a    Objective: Vital signs in last 24 hours: Temp:  [97.9 F (36.6 C)-98.9 F (37.2 C)] 98.9 F (37.2 C) (07/07 0500) Pulse Rate:  [82-89] 82 (07/07 0500) Resp:  [16-18] 16 (07/07 0500) BP: (121-130)/(63-77) 121/63 mmHg (07/07 0500) SpO2:  [95 %-97 %] 97 % (07/07 0500)   General appearance: alert, cooperative and no distress Resp: diminished breath sounds bibasilar Cardio: regular rate and rhythm GI: normal findings: bowel sounds normal and soft, non-tender  Lab Results  Recent Labs  08/09/12 0350 08/10/12 0430  WBC 17.3* 16.5*  HGB 8.5* 8.1*  HCT 26.1* 23.9*  NA 136 135  K 3.8 4.1  CL 106 108  CO2 22 19  BUN 46* 42*  CREATININE 1.23 1.12   Liver Panel No results found for this basename: PROT, ALBUMIN, AST, ALT, ALKPHOS, BILITOT, BILIDIR, IBILI,  in the last 72 hours Sedimentation Rate No results found for this basename: ESRSEDRATE,  in the last 72 hours C-Reactive Protein No results found for this basename: CRP,  in the last 72 hours  Microbiology: Recent Results (from the past 240 hour(s))  WOUND CULTURE     Status: None   Collection Time    08/03/12  4:17 PM      Result Value Range Status   Culture Few STAPHYLOCOCCUS AUREUS   Final   Comment: RIGHT GROIN   GRAM STAIN No WBC Seen   Final   GRAM STAIN No Squamous Epithelial Cells Seen   Final   GRAM STAIN Moderate Gram Positive Cocci In Pairs In Clusters   Final   Organism ID,  Bacteria  STAPHYLOCOCCUS AUREUS   Final   Comment: Rifampin and Gentamicin should not be used as     single drugs for treatment of Staph infections.     This organism is presumed to be Clindamycin     resistant based on detection of inducible     Clindamycin resistance.  CULTURE, BLOOD (ROUTINE X 2)     Status: None   Collection Time    08/05/12  3:56 PM      Result Value Range Status   Specimen Description BLOOD RIGHT ARM   Final   Special Requests BOTTLES DRAWN AEROBIC AND ANAEROBIC 6CC   Final   Culture  Setup Time 08/05/2012 21:02   Final   Culture     Final   Value:        BLOOD CULTURE RECEIVED NO GROWTH TO DATE CULTURE WILL BE HELD FOR 5 DAYS BEFORE ISSUING A FINAL NEGATIVE REPORT   Report Status PENDING   Incomplete  CULTURE, BLOOD (ROUTINE X 2)     Status: None   Collection Time    08/05/12  4:30 PM      Result Value Range Status   Specimen Description BLOOD LEFT ARM   Final   Special Requests BOTTLES DRAWN AEROBIC AND ANAEROBIC 5CC   Final   Culture  Setup Time 08/05/2012 21:03   Final   Culture     Final   Value:        BLOOD CULTURE RECEIVED NO GROWTH TO DATE CULTURE WILL BE HELD FOR 5 DAYS BEFORE ISSUING A FINAL NEGATIVE REPORT   Report Status PENDING   Incomplete  URINE CULTURE     Status: None   Collection Time    08/05/12  6:00 PM      Result Value Range Status   Specimen Description URINE, CLEAN CATCH   Final   Special Requests NONE   Final   Culture  Setup Time 08/06/2012 01:49   Final   Colony Count NO GROWTH   Final   Culture NO GROWTH   Final   Report Status 08/07/2012 FINAL   Final  MRSA PCR SCREENING     Status: None   Collection Time    08/05/12  6:48 PM      Result Value Range Status   MRSA by PCR NEGATIVE  NEGATIVE Final   Comment:            The GeneXpert MRSA Assay (FDA     approved for NASAL specimens     only), is one component of a     comprehensive MRSA colonization     surveillance program. It is not     intended to diagnose MRSA     infection  nor to guide or     monitor treatment for     MRSA infections.  AFB CULTURE, BLOOD     Status: None   Collection Time    08/05/12  7:54 PM      Result Value Range Status   Specimen Description BLOOD RIGHT ARM   Final   Special Requests BOTTLES DRAWN AEROBIC AND ANAEROBIC 5ML   Final   Culture     Final   Value: CULTURE WILL BE EXAMINED FOR 6 WEEKS BEFORE ISSUING A FINAL REPORT   Report Status PENDING   Incomplete  CLOSTRIDIUM DIFFICILE BY PCR     Status: None   Collection Time    08/10/12 10:16 AM      Result Value Range  Status   C difficile by pcr NEGATIVE  NEGATIVE Final    Studies/Results: No results found.   Assessment/Plan: AIDS (EPZ/DTV) Pneumonia  Presumed PCP Diarrhea CKD DM  Would continue PCP therapy States he is to have walking desat test Send stool and blood for AFB Cx.  He has his own GI whom he states will f/u in hospital with him.   Total days of antibiotics: 6 (clinda/primaquine)        B[facility]ump[facility] Infectious Diseases [phone] [email or web address] 08/10/2012, 3:33 PM   LOS: 5 days

## 2012-08-11 LAB — PTH, INTACT AND CALCIUM
Calcium, Total (PTH): 10.1 mg/dL (ref 8.4–10.5)
PTH: <2.5 pg/mL — ABNORMAL LOW (ref 14.0–72.0)

## 2012-08-11 LAB — CBC
Hemoglobin: 7.8 g/dL — ABNORMAL LOW (ref 13.0–17.0)
MCHC: 32.6 g/dL (ref 30.0–36.0)
WBC: 15.9 10*3/uL — ABNORMAL HIGH (ref 4.0–10.5)

## 2012-08-11 LAB — GLUCOSE, CAPILLARY: Glucose-Capillary: 244 mg/dL — ABNORMAL HIGH (ref 70–99)

## 2012-08-11 LAB — BASIC METABOLIC PANEL
GFR calc Af Amer: >90 mL/min (ref >90)
GFR calc non Af Amer: 80 mL/min — ABNORMAL LOW (ref >90)
Glucose, Bld: 123 mg/dL — ABNORMAL HIGH (ref 70–99)
Potassium: 4.8 mEq/L (ref 3.5–5.1)
Sodium: 134 mEq/L — ABNORMAL LOW (ref 135–145)

## 2012-08-11 LAB — CULTURE, BLOOD (ROUTINE X 2): Culture: NO GROWTH

## 2012-08-11 MED ORDER — LOPERAMIDE HCL 2 MG PO CAPS
2.0000 mg | ORAL_CAPSULE | Freq: Three times a day (TID) | ORAL | Status: DC
  Administered 2012-08-12 (×2): 2 mg via ORAL
  Filled 2012-08-11 (×6): qty 1

## 2012-08-11 NOTE — Consult Note (Addendum)
Reason for Consult: Diarrhea Referring Physician: [facility]  Patrick Brown HPI: This is a 41 year old male diagnosed with HIV on 05/2012 and CD4 of 20 on 06/19/2012 admitted for pneumonia.  The patient was hypoxic on presentation and he was treated with antibiotics, specifically clindamycin, primaquin, cefepime, and vancomycin.  Cefepime and vancomycin were discontinued as it was felt that he had PCP.  Over the course of his hospitalization his respiratory status has improved, however, he started to develop significant diarrhea.  The diarrhea started two days after his admission.  As an outpatient he did not have any issues with diarrhyea.  His current work up for his diarrhea is negative for any overt source and on 01/17/2013 he was undergoing an evaluation with Dr. Collene Mares for hematochezia, change in bowel habits, and iron deficiency anemia.  His colonoscopy was not able to be performed as he had solid stool in the rectum and a follow up procedure was not performed.  Past Medical History  Diagnosis Date  . IDDM (insulin dependent diabetes mellitus)     38 years  . Retinopathy     x2  . Proteinuria   . Dyslipidemia   . Anemia, iron deficiency On procrit  . Hematuria, microscopic 10/09    work up negative (Dr. Amalia Hailey)  . Hypothyroidism   . CKD (chronic kidney disease)   . Low HDL (under 40)   . 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 Hyperkalemia   . Depression   . Gastroparesis diabeticorum   . Gastroesophageal reflux disease   . Degenerative arthritis   . HIV positive   . CKD (chronic kidney disease) stage 3, GFR 30-59 ml/min   . SIRS (systemic inflammatory response syndrome)   . CAP (community acquired pneumonia)     Past Surgical History  Procedure  Laterality Date  . Eye surgery  [phone]    x2   . Vitrectomy  bilateral  . Insulin pump    . Colonoscopy    . Esophagogastroduodenoscopy    . Lasik Bilateral   . Cataract extraction Right     Family History  Problem Relation Age of Onset  . Diabetes type I Brother   . Prostate cancer Other   . Dementia Other   . Dementia Other   . Dementia Other     Social History:  reports that he has never smoked. He has never used smokeless tobacco. He reports that he does not drink alcohol or use illicit drugs.  Allergies:  Allergies  Allergen Reactions  . Sulfa Antibiotics Other (See Comments)    High potassium  . Ramipril Cough  . Versed (Midazolam) Other (See Comments)    "I don't wake up very good or clear it out of my system"    Medications:  Scheduled: . abacavir  600 mg Oral QHS   And  . lamiVUDine  300 mg Oral QHS  . acetaminophen  1,000 mg Oral BID  . antiseptic oral rinse  15 mL Mouth Rinse BID  . aspirin  81 mg Oral q morning - 10a  . [START ON 08/14/2012] azithromycin  1,200 mg Oral Weekly  . clindamycin  450 mg Oral Q6H  . dolutegravir  50 mg Oral QHS  . feeding supplement  237 mL Oral BID BM  . ferrous sulfate  650 mg Oral Q breakfast  . fluconazole (DIFLUCAN) IV  400 mg Intravenous Q24H  . insulin aspart  0-15 Units Subcutaneous TID WC  . insulin aspart  0-5 Units Subcutaneous QHS  . insulin pump   Subcutaneous TID AC, HS, 0200  . levothyroxine  175 mcg Oral QAC breakfast  . loratadine  10 mg Oral Daily  . metoCLOPramide  5 mg Oral TID AC  . nystatin   Topical TID  . omega-3 acid ethyl esters  2 g Oral BID  . pantoprazole  40 mg Oral Daily  . predniSONE  40 mg Oral BID  . primaquine  30 mg Oral Daily  . simvastatin  40 mg Oral QHS  . sodium bicarbonate  650 mg Oral q morning - 10a  . testosterone  5 g Transdermal q morning - 10a  . triamcinolone cream   Topical BID  . venlafaxine XR  112.5 mg Oral q morning - 10a   Continuous: . insulin pump       Results for orders placed during the hospital encounter of 08/05/12 (from the past 24 hour(s))  GLUCOSE, CAPILLARY     Status: Abnormal   Collection Time    08/10/12  5:26 PM      Result Value Range   Glucose-Capillary 162 (*) 70 - 99 mg/dL   Comment 1 Documented in Chart     Comment 2 Notify RN    AFB CULTURE, BLOOD     Status: None   Collection Time    08/10/12  5:35 PM      Result Value Range   Specimen Description BLOOD LEFT HAND     Special Requests BOTTLES DRAWN AEROBIC ONLY     Culture       Value: CULTURE WILL BE EXAMINED FOR 6 WEEKS BEFORE ISSUING A FINAL REPORT   Report Status PENDING    OCCULT BLOOD X 1 CARD TO LAB, STOOL     Status: Abnormal   Collection Time    08/10/12  8:35 PM      Result Value Range   Fecal Occult Bld POSITIVE (*) NEGATIVE  GLUCOSE, CAPILLARY     Status: Abnormal   Collection Time    08/10/12  9:30 PM      Result Value Range   Glucose-Capillary 163 (*) 70 - 99 mg/dL   Comment 1 Notify RN    GLUCOSE, CAPILLARY     Status: Abnormal   Collection Time    08/11/12  2:43 AM      Result Value Range   Glucose-Capillary 105 (*) 70 - 99 mg/dL  CBC     Status: Abnormal   Collection Time    08/11/12  4:12 AM      Result Value Range   WBC 15.9 (*) 4.0 - 10.5 K/uL   RBC 2.71 (*) 4.22 - 5.81 MIL/uL   Hemoglobin 7.8 (*) 13.0 - 17.0 g/dL   HCT 23.9 (*) 39.0 - 52.0 %   MCV 88.2  78.0 - 100.0 fL   MCH 28.8  26.0 - 34.0 pg   MCHC 32.6  30.0 - 36.0 g/dL   RDW 16.7 (*) 11.5 - 15.5 %   Platelets 477 (*) 150 - 400 K/uL  BASIC METABOLIC PANEL     Status: Abnormal   Collection Time    08/11/12  4:12 AM      Result Value Range   Sodium 134 (*) 135 - 145 mEq/L   Potassium 4.8  3.5 - 5.1  mEq/L   Chloride 108  96 - 112 mEq/L   CO2 21  19 - 32 mEq/L   Glucose, Bld 123 (*) 70 - 99 mg/dL   BUN 35 (*) 6 - 23 mg/dL   Creatinine, Ser 1.11  0.50 - 1.35 mg/dL   Calcium 10.4  8.4 - 10.5 mg/dL   GFR calc non Af Amer 80 (*) >90 mL/min   GFR calc Af Amer >90   >90 mL/min  GLUCOSE, CAPILLARY     Status: Abnormal   Collection Time    08/11/12  7:33 AM      Result Value Range   Glucose-Capillary 102 (*) 70 - 99 mg/dL  GLUCOSE, CAPILLARY     Status: Abnormal   Collection Time    08/11/12 12:25 PM      Result Value Range   Glucose-Capillary 226 (*) 70 - 99 mg/dL     No results found.  ROS:  As stated above in the HPI otherwise negative.  Blood pressure 128/82, pulse 99, temperature 97.5 F (36.4 C), temperature source Oral, resp. rate 18, height 5\' 10"  (1.778 m), weight 174 lb 6.1 oz (79.1 kg), SpO2 93.00%.    PE: Gen: NAD, Alert and Oriented HEENT:  [place]/AT, EOMI Neck: Supple, no LAD Lungs: CTA Bilaterally CV: RRR without M/G/R ABM: Soft, NTND, +BS Ext: No C/C/E  Assessment/Plan: 1) Diarrhea,. 2) HIV. 3) PCP.   He is currently on 4.5 liters of oxygen by nasal canula.  Clinically he looks well and is pending discharge with an oxygen concentrator.  I think his diarrhea is secondary to the use of antibiotics, however, he needs to continue treatment for his PCP.  He is C. Diff negative.  At this time, with the acuity of the diarrhea, current antibiotic use, and poor oxygen saturation, conservative management for his diarrhea will be pursued, i.e., schedule imodium or lomotil.  If he continues to have diarrhea off of antibiotics, then further evaluation is required.  Plan: 1) Imodium TID. 2) Continue with his current regimen for PCP. 3) D/C Reglan.  Patrick Brown D 08/11/2012, 3:16 PM

## 2012-08-11 NOTE — Progress Notes (Signed)
[facility] PROGRESS NOTE  KORBYN SYMMES [id number] DOB: 08/17/1970 DOA: 08/05/2012 PCP: Redge Gainer, MD  Brief narrative: JAIDEN Brown is an 42 y.o. male with a PMH of recently diagnosed HIV 05/2012, CD4 21 on 06/19/12, on Tivicay and epzicom, seen in ID clinic 08/05/12 by Dr. Linus Salmons and sent to the ER for further evaluation secondary to hypoxia, fever, malaise and generalized weakness.  He is being treated for PCP pneumonia. Barrier to discharge is profuse diarrhea, which we are now working up.  Assessment/Plan: Principal Problem:  Sepsis secondary to HCAP (healthcare-associated pneumonia) with acute hypoxic respiratory failure and hypoxia  -Given immunocompromise and recent hospitalization, he was initially covered for both HCAP and PCP.  -Discussed case with Dr. Tommy Medal 08/05/12 who recommended clindamycin and Primaquin for PCP and cefepime/vancomycin for HCAP. Dr. Tommy Medal evaluated the patient on 08/07/12 and discontinued Cefepime and Vancomycin, as presentation most consistent with PCP.  -F/U Blood cultures/AFB blood cultures, which are negative to date.  -Strep pneumonia andLegionella antigens negative.  -Sats 92-97%.  Initially on NRB mask, now on nasal cannula at which we are attempting to wean as tolerated. He is currently at 4.5 L per minute nasal cannula. -Initially treated with Solu-Medrol 40 mg IV every 6 hours. Now on prednisone 40 mg twice a day. Active Problems: Diarrhea -C. difficile PCR studies negative. Followup GI pathogen profile and fecal lactoferrin. -GI consultation requested. Hypercalcemia -Calcium remained elevated in the setting of low albumin. Followup PTH and ionized calcium. Right groin wound with intertrigo -Cultures from 08/03/12 grew staph aureus. -Continue Keflex (Clinda resistant). -On nystatin cream and IV Diflucan for perirectal candidiasis.  Hyperkalemia -ARB d/c'd. Please make sure this is not reordered at discharge (discontinued on discharge  med-rec). Chronic kidney disease, stage 3, mod decreased GFR  -Baseline creatinine 1.6-1.8. Current creatinine improved over usual baseline values.  DM (diabetes mellitus), type 1, uncontrolled  -Given marked elevation of blood glucoses, insulin pump d/c'd and the patient was placed on an insulin drip per glucommander protocol. -No evidence of DKA.  CBGs improved. -Currently on insulin pump and moderate scale SSI with meal coverage per pump and at bedtime coverage. Hypothyroid  -Continue home dose of Synthroid.  Chronic normocytic anemia, iron deficiency  -Continue iron therapy. Received a dose of Aranesp 08/10/2012.  Gastroparesis  -Continue Reglan.  Sinus tachycardia  -Likely from hypoxia. Improved on oxygen. HIV disease  -Continue HIV therapy. CD4 count increased to 70 (from 20 06/19/12).  Hyponatremia  -Resolved with correction of blood glucoses.   Code Status: Full.  Family Communication: No family currently at the bedside.  Disposition Plan: Home when stable.  Medical Consultants:  Dr. Rhina Brackett [place], ID  Dr. Carol Ada, gastroenterology  Other Consultants:  Diabetes coordinator  Anti-infectives:  Cefipime 08/05/12--->08/06/12  Vancomycin 08/05/12--->08/06/12  Clindamycin 08/05/12--->  Primaquine 08/05/12--->  Keflex 08/09/12--->   HPI/Subjective: Patrick Brown gets short of breath with activity, but feels comfortable at rest. He does desaturate into the mid 80s with ambulation. He complained of ongoing diarrhea, up to 12 stools per day, sometimes bloody.  Objective: Filed Vitals:   08/10/12 1736 08/10/12 1737 08/10/12 2125 08/11/12 0737  BP:   148/75 149/86  Pulse:   83 88  Temp:   97.8 F (36.6 C) 97.8 F (36.6 C)  TempSrc:   Oral Oral  Resp:   16 16  Height:      Weight:      SpO2: 93% 85% 93% 94%  Intake/Output Summary (Last 24 hours) at 08/11/12 1046 Last data filed at 08/11/12 0935  Gross per 24 hour  Intake   3280 ml  Output   1750 ml  Net    1530 ml    Exam: Gen:  NAD Cardiovascular:  RRR, No M/R/G Respiratory:  Diminished breath sounds bilaterally Gastrointestinal:  Abdomen soft, NT/ND, + BS Extremities:  No C/E/C  Data Reviewed: Basic Metabolic Panel:  Recent Labs Lab 08/07/12 1224 08/08/12 0345 08/09/12 0350 08/10/12 0430 08/11/12 0412  NA 131* 135 136 135 134*  K 3.7 4.3 3.8 4.1 4.8  CL 98 105 106 108 108  CO2 21 20 22 19 21   GLUCOSE 255* 111* 123* 90 123*  BUN 50* 46* 46* 42* 35*  CREATININE 1.49* 1.24 1.23 1.12 1.11  CALCIUM 10.5 9.9 10.7* 10.6* 10.4   GFR Estimated Creatinine Clearance: 88.6 ml/min (by C-G formula based on Cr of 1.11). Liver Function Tests:  Recent Labs Lab 08/05/12 1630  AST 13  ALT 9  ALKPHOS 102  BILITOT 0.3  PROT 6.4  ALBUMIN 2.3*   CBC:  Recent Labs Lab 08/05/12 1630  08/07/12 0420 08/08/12 0345 08/09/12 0350 08/10/12 0430 08/11/12 0412  WBC 14.5*  < > 14.4* 14.1* 17.3* 16.5* 15.9*  NEUTROABS 9.8*  --   --   --   --   --   --   HGB 8.9*  < > 8.2* 7.4* 8.5* 8.1* 7.8*  HCT 28.0*  < > 24.6* 22.5* 26.1* 23.9* 23.9*  MCV 89.7  < > 87.5 87.5 88.5 87.5 88.2  PLT 327  < > 354 375 446* 413* 477*  < > = values in this interval not displayed. BNP (last 3 results)  Recent Labs  04/16/12 2020 04/16/12 2220  PROBNP 66.5 94.3   CBG:  Recent Labs Lab 08/10/12 1205 08/10/12 1726 08/10/12 2130 08/11/12 0243 08/11/12 0733  GLUCAP 243* 162* 163* 105* 102*   Microbiology Recent Results (from the past 240 hour(s))  WOUND CULTURE     Status: None   Collection Time    08/03/12  4:17 PM      Result Value Range Status   Culture Few STAPHYLOCOCCUS AUREUS   Final   Comment: RIGHT GROIN   GRAM STAIN No WBC Seen   Final   GRAM STAIN No Squamous Epithelial Cells Seen   Final   GRAM STAIN Moderate Gram Positive Cocci In Pairs In Clusters   Final   Organism ID, Bacteria STAPHYLOCOCCUS AUREUS   Final   Comment: Rifampin and Gentamicin should not be used as     single  drugs for treatment of Staph infections.     This organism is presumed to be Clindamycin     resistant based on detection of inducible     Clindamycin resistance.  CULTURE, BLOOD (ROUTINE X 2)     Status: None   Collection Time    08/05/12  3:56 PM      Result Value Range Status   Specimen Description BLOOD RIGHT ARM   Final   Special Requests BOTTLES DRAWN AEROBIC AND ANAEROBIC [facility]   Final   Culture  Setup Time 08/05/2012 21:02   Final   Culture NO GROWTH 5 DAYS   Final   Report Status 08/11/2012 FINAL   Final  CULTURE, BLOOD (ROUTINE X 2)     Status: None   Collection Time    08/05/12  4:30 PM      Result Value Range Status  Specimen Description BLOOD LEFT ARM   Final   Special Requests BOTTLES DRAWN AEROBIC AND ANAEROBIC 5CC   Final   Culture  Setup Time 08/05/2012 21:03   Final   Culture NO GROWTH 5 DAYS   Final   Report Status 08/11/2012 FINAL   Final  URINE CULTURE     Status: None   Collection Time    08/05/12  6:00 PM      Result Value Range Status   Specimen Description URINE, CLEAN CATCH   Final   Special Requests NONE   Final   Culture  Setup Time 08/06/2012 01:49   Final   Colony Count NO GROWTH   Final   Culture NO GROWTH   Final   Report Status 08/07/2012 FINAL   Final  MRSA PCR SCREENING     Status: None   Collection Time    08/05/12  6:48 PM      Result Value Range Status   MRSA by PCR NEGATIVE  NEGATIVE Final   Comment:            The GeneXpert MRSA Assay (FDA     approved for NASAL specimens     only), is one component of a     comprehensive MRSA colonization     surveillance program. It is not     intended to diagnose MRSA     infection nor to guide or     monitor treatment for     MRSA infections.  AFB CULTURE, BLOOD     Status: None   Collection Time    08/05/12  7:54 PM      Result Value Range Status   Specimen Description BLOOD RIGHT ARM   Final   Special Requests BOTTLES DRAWN AEROBIC AND ANAEROBIC 5ML   Final   Culture     Final   Value:  CULTURE WILL BE EXAMINED FOR 6 WEEKS BEFORE ISSUING A FINAL REPORT   Report Status PENDING   Incomplete  CLOSTRIDIUM DIFFICILE BY PCR     Status: None   Collection Time    08/10/12 10:16 AM      Result Value Range Status   C difficile by pcr NEGATIVE  NEGATIVE Final  AFB CULTURE, BLOOD     Status: None   Collection Time    08/10/12  5:35 PM      Result Value Range Status   Specimen Description BLOOD LEFT HAND   Final   Special Requests BOTTLES DRAWN AEROBIC ONLY   Final   Culture     Final   Value: CULTURE WILL BE EXAMINED FOR 6 WEEKS BEFORE ISSUING A FINAL REPORT   Report Status PENDING   Incomplete     Procedures and Diagnostic Studies: Dg Chest 2 View  08/05/2012   *RADIOLOGY REPORT*  Clinical Data: Shortness of breath.  Poor oxygen saturation.  CHEST - 2 VIEW  Comparison: 07/08/2012 and multiple previous  Findings: Artifact overlies chest.  Heart size is normal.  There are patchy infiltrates throughout both lungs, most pronounced at the lung bases, consistent with pneumonia.  No effusions.  Old compression deformities are seen in the upper thoracic region.  IMPRESSION: Widespread bilateral bronchopneumonia most pronounced in the lower lobes.   Original Report Authenticated By: Nelson Chimes, M.D.    Scheduled Meds: . abacavir  600 mg Oral QHS   And  . lamiVUDine  300 mg Oral QHS  . acetaminophen  1,000 mg Oral BID  . antiseptic oral rinse  15 mL Mouth Rinse BID  . aspirin  81 mg Oral q morning - 10a  . [START ON 08/14/2012] azithromycin  1,200 mg Oral Weekly  . clindamycin  450 mg Oral Q6H  . dolutegravir  50 mg Oral QHS  . enoxaparin (LOVENOX) injection  40 mg Subcutaneous Q24H  . feeding supplement  237 mL Oral BID BM  . ferrous sulfate  650 mg Oral Q breakfast  . fluconazole (DIFLUCAN) IV  400 mg Intravenous Q24H  . insulin aspart  0-15 Units Subcutaneous TID WC  . insulin aspart  0-5 Units Subcutaneous QHS  . insulin pump   Subcutaneous TID AC, HS, 0200  . levothyroxine   175 mcg Oral QAC breakfast  . loratadine  10 mg Oral Daily  . metoCLOPramide  5 mg Oral TID AC  . nystatin   Topical TID  . omega-3 acid ethyl esters  2 g Oral BID  . pantoprazole  40 mg Oral Daily  . predniSONE  40 mg Oral BID  . primaquine  30 mg Oral Daily  . simvastatin  40 mg Oral QHS  . sodium bicarbonate  650 mg Oral q morning - 10a  . testosterone  5 g Transdermal q morning - 10a  . triamcinolone cream   Topical BID  . venlafaxine XR  112.5 mg Oral q morning - 10a   Continuous Infusions: . insulin pump      Time spent: 35 minutes with greater than 50% of time spent counseling the patient  on his test results, diagnostic impression, and plan of care.   LOS: 6 days   [facility] Pager [phone].   *Please note that the hospitalists switch teams on Wednesdays. Please call the flow manager at [phone] if you are having difficulty reaching the hospitalist taking care of this patient as she can update you and provide the most up-to-date pager number of provider caring for the patient. If 8PM-8AM, please contact night-coverage at [email or web address], password [email or web address]  08/11/2012, 10:46 AM

## 2012-08-11 NOTE — Progress Notes (Signed)
INFECTIOUS DISEASE PROGRESS NOTE  ID: VEGAS TRINGALI is a 42 y.o. male with  Principal Problem:   HCAP (healthcare-associated pneumonia) Active Problems:   Chronic kidney disease, stage 3, mod decreased GFR   DM (diabetes mellitus), type 1, uncontrolled   Hypothyroid   Anemia, iron deficiency   Gastroparesis   Sinus tachycardia   HIV disease   Acute respiratory failure with hypoxia   Hyponatremia   Hyperkalemia   Hypercalcemia   Right groin wound  Subjective: Without complaints, less diarrhea, less O2 requirements, appetite good, slept better  Abtx:  Anti-infectives   Start     Dose/Rate Route Frequency Ordered Stop   08/14/12 1000  azithromycin (ZITHROMAX) tablet 1,200 mg     1,200 mg Oral Weekly 08/09/12 1702     08/10/12 1200  fluconazole (DIFLUCAN) IVPB 400 mg     400 mg 100 mL/hr over 120 Minutes Intravenous Every 24 hours 08/10/12 1052     08/10/12 1100  fluconazole (DIFLUCAN) IVPB 200 mg  Status:  Discontinued     200 mg 100 mL/hr over 60 Minutes Intravenous Daily 08/10/12 1036 08/10/12 1052   08/09/12 1400  clindamycin (CLEOCIN) capsule 450 mg     450 mg Oral 4 times per day 08/09/12 1320     08/09/12 1000  cephALEXin (KEFLEX) capsule 500 mg  Status:  Discontinued     500 mg Oral 4 times daily 08/09/12 0701 08/09/12 1701   08/06/12 1800  vancomycin (VANCOCIN) IVPB 1000 mg/200 mL premix  Status:  Discontinued     1,000 mg 200 mL/hr over 60 Minutes Intravenous Every 12 hours 08/06/12 0900 08/06/12 1556   08/06/12 1000  dapsone tablet 100 mg  Status:  Discontinued     100 mg Oral  Every morning - 10a 08/05/12 1834 08/05/12 1919   08/05/12 2200  abacavir-lamiVUDine (EPZICOM) 600-300 MG per tablet 1 tablet  Status:  Discontinued     1 tablet Oral Daily at bedtime 08/05/12 1834 08/05/12 1902   08/05/12 2200  dolutegravir (TIVICAY) tablet 50 mg     50 mg Oral Daily at bedtime 08/05/12 1834     08/05/12 2200  ceFEPIme (MAXIPIME) 1 g in dextrose 5 % 50 mL IVPB   Status:  Discontinued     1 g 100 mL/hr over 30 Minutes Intravenous 3 times per day 08/05/12 1834 08/05/12 1848   08/05/12 2200  abacavir (ZIAGEN) tablet 600 mg     600 mg Oral Daily at bedtime 08/05/12 1904     08/05/12 2200  lamiVUDine (EPIVIR) tablet 300 mg     300 mg Oral Daily at bedtime 08/05/12 1904     08/05/12 2000  clindamycin (CLEOCIN) IVPB 600 mg  Status:  Discontinued     600 mg 100 mL/hr over 30 Minutes Intravenous Every 8 hours 08/05/12 1834 08/09/12 1320   08/05/12 2000  primaquine tablet 30 mg     30 mg Oral Daily 08/05/12 1834     08/05/12 1800  vancomycin (VANCOCIN) IVPB 750 mg/150 ml premix  Status:  Discontinued     750 mg 150 mL/hr over 60 Minutes Intravenous Every 12 hours 08/05/12 1635 08/06/12 0900   08/05/12 1630  ceFEPIme (MAXIPIME) 1 g in dextrose 5 % 50 mL IVPB  Status:  Discontinued     1 g 100 mL/hr over 30 Minutes Intravenous Every 8 hours 08/05/12 1602 08/06/12 1556   08/05/12 1600  cefTRIAXone (ROCEPHIN) 1 g in dextrose 5 % 50 mL IVPB  Status:  Discontinued     1 g 100 mL/hr over 30 Minutes Intravenous  Once 08/05/12 1558 08/05/12 1559   08/05/12 1600  azithromycin (ZITHROMAX) 500 mg in dextrose 5 % 250 mL IVPB  Status:  Discontinued     500 mg 250 mL/hr over 60 Minutes Intravenous  Once 08/05/12 1558 08/05/12 1559      Medications:  Scheduled: . abacavir  600 mg Oral QHS   And  . lamiVUDine  300 mg Oral QHS  . acetaminophen  1,000 mg Oral BID  . antiseptic oral rinse  15 mL Mouth Rinse BID  . aspirin  81 mg Oral q morning - 10a  . [START ON 08/14/2012] azithromycin  1,200 mg Oral Weekly  . clindamycin  450 mg Oral Q6H  . dolutegravir  50 mg Oral QHS  . feeding supplement  237 mL Oral BID BM  . ferrous sulfate  650 mg Oral Q breakfast  . fluconazole (DIFLUCAN) IV  400 mg Intravenous Q24H  . insulin aspart  0-15 Units Subcutaneous TID WC  . insulin aspart  0-5 Units Subcutaneous QHS  . insulin pump   Subcutaneous TID AC, HS, 0200  .  levothyroxine  175 mcg Oral QAC breakfast  . loratadine  10 mg Oral Daily  . metoCLOPramide  5 mg Oral TID AC  . nystatin   Topical TID  . omega-3 acid ethyl esters  2 g Oral BID  . pantoprazole  40 mg Oral Daily  . predniSONE  40 mg Oral BID  . primaquine  30 mg Oral Daily  . simvastatin  40 mg Oral QHS  . sodium bicarbonate  650 mg Oral q morning - 10a  . testosterone  5 g Transdermal q morning - 10a  . triamcinolone cream   Topical BID  . venlafaxine XR  112.5 mg Oral q morning - 10a    Objective: Vital signs in last 24 hours: Temp:  [97.5 F (36.4 C)-97.8 F (36.6 C)] 97.5 F (36.4 C) (07/08 1411) Pulse Rate:  [83-99] 99 (07/08 1411) Resp:  [16-18] 18 (07/08 1411) BP: (128-149)/(75-86) 128/82 mmHg (07/08 1411) SpO2:  [85 %-94 %] 93 % (07/08 1411)   General appearance: alert, cooperative and no distress Resp: clear to auscultation bilaterally Cardio: regular rate and rhythm GI: normal findings: bowel sounds normal and soft, non-tender  Lab Results  Recent Labs  08/10/12 0430 08/11/12 0412  WBC 16.5* 15.9*  HGB 8.1* 7.8*  HCT 23.9* 23.9*  NA 135 134*  K 4.1 4.8  CL 108 108  CO2 19 21  BUN 42* 35*  CREATININE 1.12 1.11   Liver Panel No results found for this basename: PROT, ALBUMIN, AST, ALT, ALKPHOS, BILITOT, BILIDIR, IBILI,  in the last 72 hours Sedimentation Rate No results found for this basename: ESRSEDRATE,  in the last 72 hours C-Reactive Protein No results found for this basename: CRP,  in the last 72 hours  Microbiology: Recent Results (from the past 240 hour(s))  WOUND CULTURE     Status: None   Collection Time    08/03/12  4:17 PM      Result Value Range Status   Culture Few STAPHYLOCOCCUS AUREUS   Final   Comment: RIGHT GROIN   GRAM STAIN No WBC Seen   Final   GRAM STAIN No Squamous Epithelial Cells Seen   Final   GRAM STAIN Moderate Gram Positive Cocci In Pairs In Clusters   Final   Organism ID, Bacteria STAPHYLOCOCCUS AUREUS  Final    Comment: Rifampin and Gentamicin should not be used as     single drugs for treatment of Staph infections.     This organism is presumed to be Clindamycin     resistant based on detection of inducible     Clindamycin resistance.  CULTURE, BLOOD (ROUTINE X 2)     Status: None   Collection Time    08/05/12  3:56 PM      Result Value Range Status   Specimen Description BLOOD RIGHT ARM   Final   Special Requests BOTTLES DRAWN AEROBIC AND ANAEROBIC [facility]   Final   Culture  Setup Time 08/05/2012 21:02   Final   Culture NO GROWTH 5 DAYS   Final   Report Status 08/11/2012 FINAL   Final  CULTURE, BLOOD (ROUTINE X 2)     Status: None   Collection Time    08/05/12  4:30 PM      Result Value Range Status   Specimen Description BLOOD LEFT ARM   Final   Special Requests BOTTLES DRAWN AEROBIC AND ANAEROBIC 5CC   Final   Culture  Setup Time 08/05/2012 21:03   Final   Culture NO GROWTH 5 DAYS   Final   Report Status 08/11/2012 FINAL   Final  URINE CULTURE     Status: None   Collection Time    08/05/12  6:00 PM      Result Value Range Status   Specimen Description URINE, CLEAN CATCH   Final   Special Requests NONE   Final   Culture  Setup Time 08/06/2012 01:49   Final   Colony Count NO GROWTH   Final   Culture NO GROWTH   Final   Report Status 08/07/2012 FINAL   Final  MRSA PCR SCREENING     Status: None   Collection Time    08/05/12  6:48 PM      Result Value Range Status   MRSA by PCR NEGATIVE  NEGATIVE Final   Comment:            The GeneXpert MRSA Assay (FDA     approved for NASAL specimens     only), is one component of a     comprehensive MRSA colonization     surveillance program. It is not     intended to diagnose MRSA     infection nor to guide or     monitor treatment for     MRSA infections.  AFB CULTURE, BLOOD     Status: None   Collection Time    08/05/12  7:54 PM      Result Value Range Status   Specimen Description BLOOD RIGHT ARM   Final   Special Requests BOTTLES  DRAWN AEROBIC AND ANAEROBIC 5ML   Final   Culture     Final   Value: CULTURE WILL BE EXAMINED FOR 6 WEEKS BEFORE ISSUING A FINAL REPORT   Report Status PENDING   Incomplete  CLOSTRIDIUM DIFFICILE BY PCR     Status: None   Collection Time    08/10/12 10:16 AM      Result Value Range Status   C difficile by pcr NEGATIVE  NEGATIVE Final  AFB CULTURE, BLOOD     Status: None   Collection Time    08/10/12  5:35 PM      Result Value Range Status   Specimen Description BLOOD LEFT HAND   Final   Special Requests BOTTLES DRAWN AEROBIC ONLY  Final   Culture     Final   Value: CULTURE WILL BE EXAMINED FOR 6 WEEKS BEFORE ISSUING A FINAL REPORT   Report Status PENDING   Incomplete    Studies/Results: No results found.   Assessment/Plan: AIDS Diarrhea Pneumonia  Presumed PCP CKD- improved DM  Blood C x for AFB sent Stool AFB not in computer (yet), I have re-ordered will d/i with nursing. GI pathogen panel pending GI to eval May need to stop clinda, change to dapsone if diarrhea worsens.   Total days of antibiotics: 7 (clinda/primaquine, prednisone)         Bobby Rumpf Infectious Diseases (pager) [phone] [email or web address] 08/11/2012, 3:34 PM   LOS: 6 days

## 2012-08-12 DIAGNOSIS — D509 Iron deficiency anemia, unspecified: Secondary | ICD-10-CM

## 2012-08-12 DIAGNOSIS — J9600 Acute respiratory failure, unspecified whether with hypoxia or hypercapnia: Secondary | ICD-10-CM

## 2012-08-12 LAB — CBC
HCT: 27.3 % — ABNORMAL LOW (ref 39.0–52.0)
MCV: 89.8 fL (ref 78.0–100.0)
RBC: 3.04 MIL/uL — ABNORMAL LOW (ref 4.22–5.81)
WBC: 22.4 10*3/uL — ABNORMAL HIGH (ref 4.0–10.5)

## 2012-08-12 LAB — GI PATHOGEN PANEL BY PCR, STOOL
C difficile toxin A/B: NEGATIVE
Campylobacter by PCR: NEGATIVE
Cryptosporidium by PCR: NEGATIVE
G lamblia by PCR: NEGATIVE
Rotavirus A by PCR: NEGATIVE

## 2012-08-12 LAB — BASIC METABOLIC PANEL
BUN: 38 mg/dL — ABNORMAL HIGH (ref 6–23)
CO2: 21 mEq/L (ref 19–32)
Chloride: 104 mEq/L (ref 96–112)
Creatinine, Ser: 1.11 mg/dL (ref 0.50–1.35)

## 2012-08-12 LAB — GLUCOSE, CAPILLARY: Glucose-Capillary: 112 mg/dL — ABNORMAL HIGH (ref 70–99)

## 2012-08-12 LAB — FECAL LACTOFERRIN, QUANT

## 2012-08-12 MED ORDER — VENLAFAXINE HCL ER 37.5 MG PO CP24: 112.5000 mg | ORAL_CAPSULE | Freq: Every morning | ORAL | Status: DC

## 2012-08-12 MED ORDER — AZITHROMYCIN 600 MG PO TABS: 1200.0000 mg | ORAL_TABLET | ORAL | Status: DC

## 2012-08-12 MED ORDER — TRAMADOL HCL 50 MG PO TABS: 50.0000 mg | ORAL_TABLET | Freq: Four times a day (QID) | ORAL | Status: DC | PRN

## 2012-08-12 MED ORDER — PREDNISONE 20 MG PO TABS: 40.0000 mg | ORAL_TABLET | Freq: Two times a day (BID) | ORAL | Status: AC

## 2012-08-12 MED ORDER — TRIAMCINOLONE ACETONIDE 0.5 % EX CREA: TOPICAL_CREAM | Freq: Two times a day (BID) | CUTANEOUS | Status: DC

## 2012-08-12 MED ORDER — PREDNISONE 20 MG PO TABS: 40.0000 mg | ORAL_TABLET | Freq: Every day | ORAL | Status: AC

## 2012-08-12 MED ORDER — PREDNISONE 20 MG PO TABS: 40.0000 mg | ORAL_TABLET | Freq: Two times a day (BID) | ORAL | Status: DC

## 2012-08-12 MED ORDER — LOPERAMIDE HCL 2 MG PO CAPS: 2.0000 mg | ORAL_CAPSULE | Freq: Three times a day (TID) | ORAL | Status: DC

## 2012-08-12 MED ORDER — FLUCONAZOLE 200 MG PO TABS: 200.0000 mg | ORAL_TABLET | Freq: Every day | ORAL | Status: DC

## 2012-08-12 MED ORDER — CEPHALEXIN 500 MG PO CAPS: 500.0000 mg | ORAL_CAPSULE | Freq: Four times a day (QID) | ORAL | Status: DC

## 2012-08-12 MED ORDER — PREDNISONE 20 MG PO TABS: 20.0000 mg | ORAL_TABLET | Freq: Every day | ORAL | Status: DC

## 2012-08-12 MED ORDER — ONDANSETRON HCL 4 MG PO TABS: 4.0000 mg | ORAL_TABLET | Freq: Three times a day (TID) | ORAL | Status: DC | PRN

## 2012-08-12 NOTE — Progress Notes (Addendum)
Pt was 95 % on 4 L via [place] at rest. O2 was removed and pt was 92% on RA at rest. Pt ambulated 15 feet and O2 sat dropped to 85%. Oxygen was resumed and pt was 95% on 4 L [place] while ambulating. Noreene Larsson RN

## 2012-08-12 NOTE — Care Management Note (Addendum)
Please complete this template prior to discharge to assess for eligibility of  Home oxygen therapy.   SATURATION QUALIFICATIONS: (This note is used to comply with regulatory documentation for home oxygen)  Patient Saturations on Room Air at Rest =  Patient Saturations on Room Air while Ambulating =   Patient Saturations on ?  Liters of oxygen while Ambulating =   Please briefly explain why patient needs home oxygen:

## 2012-08-12 NOTE — Care Management Note (Addendum)
CARE MANAGEMENT NOTE 08/12/2012  Patient:  Patrick Brown, Patrick Brown   Account Number:  [id number]  Date Initiated:  08/12/2012  Documentation initiated by:  Rabab Currington  Subjective/Objective Assessment:   42 yo male admitted with HCAP. PCP: Redge Gainer, MD       Action/Plan:   Home when stable   Anticipated DC Date:     Anticipated DC Plan:  HOME/SELF CARE  In-house referral   [place]  CM consult        Choice offered to / List presented to:  NA   DME arranged  [facility]      DME agency  [place] arranged  NA      [place] agency  NA   Status of service:  In process, will continue to follow Medicare Important Message given?   (If response is "NO", the following Medicare IM given date fields will be blank) Date Medicare IM given:   Date Additional Medicare IM given:    Discharge Disposition:    Per UR Regulation:  Reviewed for med. necessity/level of care/duration of stay  If discussed at [place] of Stay Meetings, dates discussed:    Comments:  08/12/12 [place] [id number] Home oxygen therapy delivered to room prior to discharge. No other barriers identified. Follow up appt made.PCP: Redge Gainer, MD. Pt's mother to provide tx home.

## 2012-08-12 NOTE — Progress Notes (Signed)
Pt ready for d/c. This RN went over d/c paperwork with pt. Instructed about follow-up appointments, prednisone taper and other new medications. PIV removed, WNL. No change in pt condition since AM assessment. Pt d/c'd to home with mother on home O2. Noreene Larsson RN

## 2012-08-12 NOTE — Discharge Summary (Addendum)
Physician Discharge Summary  Patrick Brown [id number] DOB: 06-27-1970 DOA: 08/05/2012  PCP: Redge Gainer, MD  Admit date: 08/05/2012 Discharge date: 08/12/2012  Recommendations for Outpatient Follow-up:  1. please continue azithromycin 1200 mg every week. Please followup with infectious disease in regards to when this medication can be discontinued. This is usually dependent on your CD4 count so it will be monitored in an outpatient setting and based on the numbers the decision will be made when you can discontinue azithromycin.  2. please note that you are being discharged on prednisone taper: 40 mg twice daily for 5 days (July 9 until July 13/2014), then 40 mg once a day for 5 days (July 14 until July 18/2014), then 20 mg once a day for 11 days starting 08/22/2012 and once completed stop.  3. continue fluconazole 200 mg once a day for 5 days for fungal groin infection.  4. Continue dapsone per infectious disease recommendations  5. Hemoglobin was 8.8 on discharge. We will forward the discharge summary to your primary care provider. Recommendation is to repeat CBC and make sure that hemoglobin remains stable.  6. Follow up stool AFB  7. Order placed for DME home oxygen 4 L.  8. Please note that calcium prior to discharge is 10.9 which is slightly above the normal range. PTH on this admission was less than 2.5. I spoke with renal on-call and recommendation was to obtain a vitamin D level but patient already had a vitamin D level checked in May 2000 410 and it was within normal limits. Please continue to follow calcium level on outpatient basis. If it continues to rise then treatment may be warranted with bisphosphonates.  9. Losartan stopped.  Discharge Diagnoses:  Principal Problem:   HCAP (healthcare-associated pneumonia) Active Problems:   Chronic kidney disease, stage 3, mod decreased GFR   DM (diabetes mellitus), type 1, uncontrolled   Hypothyroid   Anemia, iron deficiency    Gastroparesis   Sinus tachycardia   HIV disease   Acute respiratory failure with hypoxia   Hyponatremia   Hyperkalemia   Hypercalcemia   Right groin wound   Discharge Condition: Medically stable for discharge home today  Diet recommendation: As tolerated  History of present illness:  42 y.o. male with a PMH of recently diagnosed HIVAIDS (diagnosed in 05/2012), CD4 20 on 06/19/12 and most recently on this admission 70, on HAART, seen in ID clinic 08/05/12 by Dr. Linus Salmons and sent to the ER for further evaluation secondary to hypoxia, fever, malaise and generalized weakness. He was admitted for treatment of PCP pneumonia.   Assessment/Plan:   Principal Problem:  Sepsis secondary to HCAP (healthcare-associated pneumonia) and PCP pneumonia with acute hypoxic respiratory failure and hypoxia  - Patient was initially on broad-spectrum antibiotics due to his immunocompromised state - per section cc, Dr. Tommy Medal 08/05/12 recommendation was for clindamycin and Primaquin for PCP and cefepime/vancomycin for HCAP. Dr. Tommy Medal evaluated the patient on 08/07/12 and discontinued Cefepime and Vancomycin as presentation was most consistent with PCP.  - Blood cultures/AFB blood cultures, which are negative to date.  - Strep pneumonia andLegionella antigens negative.  - Order placed for home oxygen, 4 L nasal cannula continuous - I spoke with infectious disease and recommendation was to stop clindamycin and primaquine. Patient will continue taking dapsone. In addition patient will be on prednisone taper as noted above 40 mg twice daily for next 5 days, 40 mg once a day for additional 5 days and 20 mg once  daily for 11 days thereafter. - patient will continue taking HAART as recommended by ID  Active Problems:  Diarrhea  - Likely secondary to Priamquin - C. difficile PCR studies negative. - GI pathogen profile normal and fecal lactoferrin positive - Appreciate GI consult; recommendation was for Imodium 3 times a  day. Prescription for Imodium provided. Hypercalcemia  - Calcium remained elevated in the setting of low albumin. PTH low at less than 2.5. Recommendation noted above in PCP follow up section. Right groin wound with intertrigo  - Cultures from 08/03/12 grew staph aureus. Has received Keflex which we will continue for 6 more days. - We will continue fluconazole for 5 days - Continue nystatin powder  Hyperkalemia  - ARB stopped Chronic kidney disease, stage 3, mod decreased GFR  - Baseline creatinine 1.6-1.8.  DM (diabetes mellitus), type 1, uncontrolled  - may return home with insulin pump which patient prefers  Hypothyroid  - Continue home dose of Synthroid.  Chronic normocytic anemia, iron deficiency  - Continue iron therapy. Received a dose of Aranesp 08/10/2012.  Gastroparesis  - Continue Reglan.  Sinus tachycardia  - Likely from hypoxia. Improved on oxygen.  HIV disease, AIDS -Continue HAART. CD4 count increased to 70 (from 20 06/19/12).  Hyponatremia  -Resolved with correction of blood glucoses.   Code Status: Full.  Family Communication: Mother currently at the bedside.  Disposition Plan: Home today   Medical Consultants:  Dr. Rhina Brackett [place], ID  Dr. Carol Ada, Gastroenterology Other Consultants:  Diabetes coordinator Anti-infectives:  Cefipime 08/05/12--->08/06/12  Vancomycin 08/05/12--->08/06/12  Clindamycin 08/05/12---> discontinued prior to discharge Primaquine 08/05/12---> discontinued prior to discharge  Keflex 08/09/12---> for 6 days on discharge    Discharge Exam: Filed Vitals:   08/12/12 0501  BP: 134/66  Pulse: 78  Temp: 97.5 F (36.4 C)  Resp: 16   Filed Vitals:   08/11/12 0737 08/11/12 1411 08/11/12 2117 08/12/12 0501  BP: 149/86 128/82 154/89 134/66  Pulse: 88 99 105 78  Temp: 97.8 F (36.6 C) 97.5 F (36.4 C) 97.6 F (36.4 C) 97.5 F (36.4 C)  TempSrc: Oral Oral Oral Oral  Resp: 16 18 16 16   Height:      Weight:      SpO2: 94% 93% 93% 93%     General: Pt is alert, follows commands appropriately, not in acute distress Cardiovascular: Regular rate and rhythm, S1/S2 +, no murmurs, no rubs, no gallops Respiratory: Clear to auscultation bilaterally, no wheezing, no crackles, no rhonchi Abdominal: Soft, non tender, non distended, bowel sounds +, no guarding Extremities: no edema, no cyanosis, pulses palpable bilaterally DP and PT Neuro: Grossly nonfocal  Discharge Instructions  Discharge Orders   Future Appointments Provider Department Dept Phone   08/17/2012 3:15 PM Thayer Headings, MD [facility] [phone]   08/19/2012 3:45 PM Kathee Delton, MD [facility] [phone]   09/23/2012 8:30 AM Chipper Herb, MD [place] [phone]   12/15/2012 9:00 AM Kathrynn Ducking, MD [facility] [phone]   Future Orders Complete By Expires     Call MD for:  difficulty breathing, headache or visual disturbances  As directed     Call MD for:  persistant dizziness or light-headedness  As directed     Call MD for:  persistant nausea and vomiting  As directed     Call MD for:  severe uncontrolled pain  As directed     Diet - low sodium  heart healthy  As directed     Discharge instructions  As directed     Comments:      1. please continue azithromycin 1200 mg every week. Please followup with infectious disease in regards to when this medication can be discontinued. This is usually dependent on your CD4 count so it will be monitored in an outpatient setting and based on the numbers the decision will be made when you can discontinue azithromycin. 2. please note that you are being discharged on prednisone taper: 40 mg twice daily for 5 days (July 9 until July 13/2014), then 40 mg once a day for 5 days (July 14 until July 18/2014), then 20 mg once a day for 11 days starting 08/22/2012 and once completed stop. 3. continue fluconazole 200 mg once a day  for 5 days for fungal groin infection. 4. Continue dapsone per infectious disease recommendations 5. Hemoglobin was 8.8 on discharge. We will forward the discharge summary to your primary care provider. Recommendation is to repeat CBC and make sure that hemoglobin remains stable.    Increase activity slowly  As directed         Medication List    STOP taking these medications       cephALEXin 500 MG capsule  Commonly known as:  KEFLEX      TAKE these medications       abacavir-lamiVUDine 600-300 MG per tablet  Commonly known as:  EPZICOM  Take 1 tablet by mouth at bedtime.     acetaminophen 500 MG tablet  Commonly known as:  TYLENOL  Take 1,000 mg by mouth 2 (two) times daily.     aspirin 81 MG chewable tablet  Chew 81 mg by mouth every morning.     azithromycin 600 MG tablet  Commonly known as:  ZITHROMAX  Take 2 tablets (1,200 mg total) by mouth once a week.  Start taking on:  08/14/2012     dapsone 100 MG tablet  Take 100 mg by mouth every morning.     dolutegravir 50 MG tablet  Commonly known as:  TIVICAY  Take 50 mg by mouth at bedtime.     epoetin alfa 20000 UNIT/ML injection  Commonly known as:  EPOGEN,PROCRIT  Inject 20,000 Units into the skin every 30 (thirty) days.     esomeprazole 40 MG capsule  Commonly known as:  NEXIUM  Take 40 mg by mouth daily before breakfast.     ferrous sulfate 325 (65 FE) MG tablet  Take 650 mg by mouth daily with breakfast.     fluconazole 200 MG tablet  Commonly known as:  DIFLUCAN  Take 1 tablet (200 mg total) by mouth daily.     furosemide 20 MG tablet  Commonly known as:  LASIX  Take 20 mg by mouth 2 (two) times daily.     GLUCOSAMINE 1500 COMPLEX PO  Take 1 tablet by mouth 2 (two) times daily.     hydrOXYzine 25 MG tablet  Commonly known as:  ATARAX/VISTARIL  Take 25 mg by mouth 2 (two) times daily.     insulin pump 100 unit/ml Soln  Inject into the skin continuous. He uses Novolog Insulin in pump. Per  sliding scale.     levothyroxine 175 MCG tablet  Commonly known as:  SYNTHROID, LEVOTHROID  Take 1 tablet (175 mcg total) by mouth every morning.     loperamide 2 MG capsule  Commonly known as:  IMODIUM  Take 1 capsule (2 mg total) by  mouth 3 (three) times daily before meals.     losartan 25 MG tablet  Commonly known as:  COZAAR  Take 1 tablet (25 mg total) by mouth every morning.     metoCLOPramide 5 MG tablet  Commonly known as:  REGLAN  Take 1 tablet (5 mg total) by mouth 3 (three) times daily before meals.     omega-3 acid ethyl esters 1 G capsule  Commonly known as:  LOVAZA  Take 2 capsules (2 g total) by mouth 2 (two) times daily.     ondansetron 4 MG tablet  Commonly known as:  ZOFRAN  Take 1 tablet (4 mg total) by mouth every 8 (eight) hours as needed for nausea.     predniSONE 20 MG tablet  Commonly known as:  DELTASONE  Take 2 tablets (40 mg total) by mouth 2 (two) times daily.     predniSONE 20 MG tablet  Commonly known as:  DELTASONE  Take 2 tablets (40 mg total) by mouth daily.  Start taking on:  08/17/2012     predniSONE 20 MG tablet  Commonly known as:  DELTASONE  Take 1 tablet (20 mg total) by mouth daily.  Start taking on:  08/22/2012     simvastatin 40 MG tablet  Commonly known as:  ZOCOR  Take 40 mg by mouth at bedtime.     sodium bicarbonate 650 MG tablet  Take 650 mg by mouth every morning.     sodium polystyrene 15 GM/60ML suspension  Commonly known as:  KAYEXALATE  Take 30 g by mouth once a week.     testosterone 50 MG/5GM Gel  Commonly known as:  ANDROGEL  Place 5 g onto the skin every morning.     traMADol 50 MG tablet  Commonly known as:  ULTRAM  Take 1 tablet (50 mg total) by mouth every 6 (six) hours as needed for pain.     triamcinolone cream 0.5 %  Commonly known as:  KENALOG  Apply topically 2 (two) times daily.     venlafaxine XR 37.5 MG 24 hr capsule  Commonly known as:  EFFEXOR-XR  Take 3 capsules (112.5 mg total) by  mouth every morning.           Follow-up Information   Follow up with Redge Gainer, MD In 1 week.   Contact information:   [place] [address] [phone]        The results of significant diagnostics from this hospitalization (including imaging, microbiology, ancillary and laboratory) are listed below for reference.    Significant Diagnostic Studies: Dg Chest 2 View  08/05/2012   *RADIOLOGY REPORT*  Clinical Data: Shortness of breath.  Poor oxygen saturation.  CHEST - 2 VIEW  Comparison: 07/08/2012 and multiple previous  Findings: Artifact overlies chest.  Heart size is normal.  There are patchy infiltrates throughout both lungs, most pronounced at the lung bases, consistent with pneumonia.  No effusions.  Old compression deformities are seen in the upper thoracic region.  IMPRESSION: Widespread bilateral bronchopneumonia most pronounced in the lower lobes.   Original Report Authenticated By: Nelson Chimes, M.D.    Microbiology: Recent Results (from the past 240 hour(s))  WOUND CULTURE     Status: None   Collection Time    08/03/12  4:17 PM      Result Value Range Status   Culture Few STAPHYLOCOCCUS AUREUS   Final   Comment: RIGHT GROIN   GRAM STAIN No WBC Seen  Final   GRAM STAIN No Squamous Epithelial Cells Seen   Final   GRAM STAIN Moderate Gram Positive Cocci In Pairs In Clusters   Final   Organism ID, Bacteria STAPHYLOCOCCUS AUREUS   Final   Comment: Rifampin and Gentamicin should not be used as     single drugs for treatment of Staph infections.     This organism is presumed to be Clindamycin     resistant based on detection of inducible     Clindamycin resistance.  CULTURE, BLOOD (ROUTINE X 2)     Status: None   Collection Time    08/05/12  3:56 PM      Result Value Range Status   Specimen Description BLOOD RIGHT ARM   Final   Special Requests BOTTLES DRAWN AEROBIC AND ANAEROBIC [facility]   Final   Culture  Setup Time 08/05/2012 21:02   Final    Culture NO GROWTH 5 DAYS   Final   Report Status 08/11/2012 FINAL   Final  CULTURE, BLOOD (ROUTINE X 2)     Status: None   Collection Time    08/05/12  4:30 PM      Result Value Range Status   Specimen Description BLOOD LEFT ARM   Final   Special Requests BOTTLES DRAWN AEROBIC AND ANAEROBIC 5CC   Final   Culture  Setup Time 08/05/2012 21:03   Final   Culture NO GROWTH 5 DAYS   Final   Report Status 08/11/2012 FINAL   Final  URINE CULTURE     Status: None   Collection Time    08/05/12  6:00 PM      Result Value Range Status   Specimen Description URINE, CLEAN CATCH   Final   Special Requests NONE   Final   Culture  Setup Time 08/06/2012 01:49   Final   Colony Count NO GROWTH   Final   Culture NO GROWTH   Final   Report Status 08/07/2012 FINAL   Final  MRSA PCR SCREENING     Status: None   Collection Time    08/05/12  6:48 PM      Result Value Range Status   MRSA by PCR NEGATIVE  NEGATIVE Final   Comment:            The GeneXpert MRSA Assay (FDA     approved for NASAL specimens     only), is one component of a     comprehensive MRSA colonization     surveillance program. It is not     intended to diagnose MRSA     infection nor to guide or     monitor treatment for     MRSA infections.  AFB CULTURE, BLOOD     Status: None   Collection Time    08/05/12  7:54 PM      Result Value Range Status   Specimen Description BLOOD RIGHT ARM   Final   Special Requests BOTTLES DRAWN AEROBIC AND ANAEROBIC 5ML   Final   Culture     Final   Value: CULTURE WILL BE EXAMINED FOR 6 WEEKS BEFORE ISSUING A FINAL REPORT   Report Status PENDING   Incomplete  CLOSTRIDIUM DIFFICILE BY PCR     Status: None   Collection Time    08/10/12 10:16 AM      Result Value Range Status   C difficile by pcr NEGATIVE  NEGATIVE Final  AFB CULTURE, BLOOD     Status: None  Collection Time    08/10/12  5:35 PM      Result Value Range Status   Specimen Description BLOOD LEFT HAND   Final   Special Requests  BOTTLES DRAWN AEROBIC ONLY   Final   Culture     Final   Value: CULTURE WILL BE EXAMINED FOR 6 WEEKS BEFORE ISSUING A FINAL REPORT   Report Status PENDING   Incomplete     Labs: Basic Metabolic Panel:  Recent Labs Lab 08/08/12 0345 08/09/12 0350 08/10/12 0430 08/10/12 1143 08/11/12 0412 08/12/12 0414  NA 135 136 135  --  134* 134*  K 4.3 3.8 4.1  --  4.8 4.5  CL 105 106 108  --  108 104  CO2 20 22 19   --  21 21  GLUCOSE 111* 123* 90  --  123* 115*  BUN 46* 46* 42*  --  35* 38*  CREATININE 1.24 1.23 1.12  --  1.11 1.11  CALCIUM 9.9 10.7* 10.6* 10.1 10.4 10.9*   Liver Function Tests:  Recent Labs Lab 08/05/12 1630  AST 13  ALT 9  ALKPHOS 102  BILITOT 0.3  PROT 6.4  ALBUMIN 2.3*   No results found for this basename: LIPASE, AMYLASE,  in the last 168 hours No results found for this basename: AMMONIA,  in the last 168 hours CBC:  Recent Labs Lab 08/05/12 1630  08/08/12 0345 08/09/12 0350 08/10/12 0430 08/11/12 0412 08/12/12 0414  WBC 14.5*  < > 14.1* 17.3* 16.5* 15.9* 22.4*  NEUTROABS 9.8*  --   --   --   --   --   --   HGB 8.9*  < > 7.4* 8.5* 8.1* 7.8* 8.8*  HCT 28.0*  < > 22.5* 26.1* 23.9* 23.9* 27.3*  MCV 89.7  < > 87.5 88.5 87.5 88.2 89.8  PLT 327  < > 375 446* 413* 477* 520*  < > = values in this interval not displayed. Cardiac Enzymes: No results found for this basename: CKTOTAL, CKMB, CKMBINDEX, TROPONINI,  in the last 168 hours BNP: BNP (last 3 results)  Recent Labs  04/16/12 2020 04/16/12 2220  PROBNP 66.5 94.3   CBG:  Recent Labs Lab 08/11/12 1225 08/11/12 1648 08/11/12 2116 08/12/12 0337 08/12/12 0738  GLUCAP 226* 252* 244* 112* 161*    Time coordinating discharge: Over 30 minutes  Signed:  Leisa Lenz, MD  [facility]  08/12/2012, 10:39 AM  Pager #: [phone]

## 2012-08-13 LAB — HIV-1 GENOTYPR PLUS

## 2012-08-17 ENCOUNTER — Ambulatory Visit: Payer: Self-pay

## 2012-08-17 ENCOUNTER — Ambulatory Visit (INDEPENDENT_AMBULATORY_CARE_PROVIDER_SITE_OTHER): Payer: BC Managed Care – PPO | Admitting: Internal Medicine

## 2012-08-17 ENCOUNTER — Encounter: Payer: Self-pay | Admitting: Internal Medicine

## 2012-08-17 VITALS — BP 150/89 | HR 97 | Temp 97.5°F | Ht 70.0 in | Wt 178.0 lb

## 2012-08-17 DIAGNOSIS — N183 Chronic kidney disease, stage 3 unspecified: Secondary | ICD-10-CM

## 2012-08-17 DIAGNOSIS — B20 Human immunodeficiency virus [HIV] disease: Secondary | ICD-10-CM

## 2012-08-17 DIAGNOSIS — E875 Hyperkalemia: Secondary | ICD-10-CM

## 2012-08-17 DIAGNOSIS — Z5189 Encounter for other specified aftercare: Secondary | ICD-10-CM

## 2012-08-17 DIAGNOSIS — S31109D Unspecified open wound of abdominal wall, unspecified quadrant without penetration into peritoneal cavity, subsequent encounter: Secondary | ICD-10-CM

## 2012-08-17 DIAGNOSIS — L299 Pruritus, unspecified: Secondary | ICD-10-CM | POA: Insufficient documentation

## 2012-08-17 DIAGNOSIS — J189 Pneumonia, unspecified organism: Secondary | ICD-10-CM

## 2012-08-17 LAB — BASIC METABOLIC PANEL WITHOUT GFR
BUN: 54 mg/dL — ABNORMAL HIGH (ref 6–23)
CO2: 27 meq/L (ref 19–32)
Calcium: 10 mg/dL (ref 8.4–10.5)
Chloride: 98 meq/L (ref 96–112)
Creat: 1.77 mg/dL — ABNORMAL HIGH (ref 0.50–1.35)
GFR, Est African American: 53 mL/min — ABNORMAL LOW
GFR, Est Non African American: 46 mL/min — ABNORMAL LOW
Glucose, Bld: 58 mg/dL — ABNORMAL LOW (ref 70–99)
Potassium: 6.2 meq/L — ABNORMAL HIGH (ref 3.5–5.3)
Sodium: 134 meq/L — ABNORMAL LOW (ref 135–145)

## 2012-08-17 MED ORDER — NYSTATIN 100000 UNIT/GM EX CREA: TOPICAL_CREAM | Freq: Two times a day (BID) | CUTANEOUS | Status: DC

## 2012-08-17 MED ORDER — HYDROXYZINE HCL 25 MG PO TABS: 25.0000 mg | ORAL_TABLET | Freq: Four times a day (QID) | ORAL | Status: DC | PRN

## 2012-08-17 MED ORDER — FLUCONAZOLE 200 MG PO TABS: 200.0000 mg | ORAL_TABLET | Freq: Every day | ORAL | Status: DC

## 2012-08-17 NOTE — Assessment & Plan Note (Signed)
He has asked to have his potassium checked today since he has had chronic high potassium. I will check this today and have him followup with his primary doctor.

## 2012-08-17 NOTE — Progress Notes (Signed)
  Subjective:    Patient ID: Patrick Brown, male    DOB: 17-Mar-1970, 42 y.o.   MRN: [id number]  HPI Here for hospital followup. He was seen about 2 weeks ago for routine visit however noted to be significantly hypoxic and short of breath. He was sent to the hospital and empirically treated for PCP pneumonia. He initially was put on clindamycin and Primaquine and help her with concerns of diarrhea and other side effects he was changed to dapsone. He is also taking prednisone. He has multiple complaints including poor sleep, leg swelling, high blood pressure. He tells me he has had chronically elevated potassium and chronically takes Kayexalate and is concerned with his kidney function. He previously took Cozaar that his nephrologist has not continued him on anything do to his high potassium. He did not tolerate ACE inhibitor in the past. He otherwise takes his HIV medicines well with no missed doses. He is having problems paying for it even with the co-pay cards. He has recently become unemployed. He not currently short of breath, he does have some thrush. He does continue to have his groin rash but is much better. He is concerned about his potassium level.   Review of Systems  Constitutional: Negative for fever, appetite change, fatigue and unexpected weight change.  HENT: Negative for sore throat and trouble swallowing.   Eyes: Negative for visual disturbance.  Respiratory: Negative for cough and shortness of breath.   Cardiovascular: Positive for leg swelling.  Gastrointestinal: Negative for nausea, abdominal pain and diarrhea.  Genitourinary: Negative for difficulty urinating.  Musculoskeletal: Negative for myalgias.  Skin: Negative for rash.  Neurological: Negative for dizziness, light-headedness and headaches.  Hematological: Negative for adenopathy.  Psychiatric/Behavioral: Negative for dysphoric mood.       Objective:   Physical Exam  Constitutional: He appears well-developed and  well-nourished. No distress.  HENT:  Mild thrush on tongue  Eyes: Right eye exhibits no discharge. Left eye exhibits no discharge. No scleral icterus.  Cardiovascular: Normal rate, regular rhythm and normal heart sounds.   No murmur heard. Pulmonary/Chest: Effort normal and breath sounds normal. No respiratory distress. He has no wheezes. He has no rales.  Musculoskeletal: He exhibits edema.  Nonpitting edema  Lymphadenopathy:    He has no cervical adenopathy.  Skin: Skin is warm and dry. No rash noted.  He has some lesions on his arms and legs secondary to scratching          Assessment & Plan:

## 2012-08-17 NOTE — Assessment & Plan Note (Signed)
He continues to have significant itching. He will continue with Atarax as needed.

## 2012-08-17 NOTE — Assessment & Plan Note (Signed)
Unclear etiology. His PTH is less than 2.5. He may need further evaluation if it increases by his primary doctor

## 2012-08-17 NOTE — Assessment & Plan Note (Signed)
He is worried about his high blood pressure and is insistent on taking any medication such as an ARB. This was stopped to 2 hyperkalemia. I encouraged him to discuss this with his nephrologist or his primary doctor if there is other appropriate blood pressure medications needed

## 2012-08-17 NOTE — Assessment & Plan Note (Signed)
It is unclear if his pneumonia was do to bacterial or PCP. He is continuing on PCP pneumonia treatment and will finish the 21 days. He is doing well overall though so we'll continue.

## 2012-08-17 NOTE — Assessment & Plan Note (Signed)
He has had notable improvement and we'll continue with topical therapy

## 2012-08-17 NOTE — Assessment & Plan Note (Signed)
He continues to do well with his medications and I will check his labs at his next visit in about 3 weeks.

## 2012-08-18 ENCOUNTER — Telehealth: Payer: Self-pay | Admitting: Internal Medicine

## 2012-08-18 NOTE — Telephone Encounter (Signed)
Patient will contact Dr. Tawanna Sat office. Patrick Brown

## 2012-08-18 NOTE — Telephone Encounter (Signed)
Patient may come in for BMP to followup on potassium

## 2012-08-18 NOTE — Telephone Encounter (Signed)
His potassium is 6.2.  He needs to take his kayexalate and follow up with his primary doctor.  Thanks

## 2012-08-19 ENCOUNTER — Encounter: Payer: Self-pay | Admitting: Pulmonary Disease

## 2012-08-19 ENCOUNTER — Ambulatory Visit (INDEPENDENT_AMBULATORY_CARE_PROVIDER_SITE_OTHER): Payer: BC Managed Care – PPO | Admitting: Pulmonary Disease

## 2012-08-19 VITALS — BP 124/72 | HR 84 | Temp 98.5°F | Ht 70.0 in | Wt 182.4 lb

## 2012-08-19 DIAGNOSIS — B59 Pneumocystosis: Secondary | ICD-10-CM | POA: Insufficient documentation

## 2012-08-19 NOTE — Progress Notes (Signed)
  Subjective:    Patient ID: Patrick Brown, male    DOB: 05-05-1970, 42 y.o.   MRN: [id number]  HPI The patient is a 42 year old male who I've been asked to see for abnormal chest x-rays and pneumonia.  The patient has had issues with increased shortness of breath over the last few months, but did not have any significant cough or mucus.  He had a chest x-ray the end of May that showed atelectasis versus infiltrate in his lingula, and had a chest x-ray in June and it only showed changes most consistent with streaky atelectasis.  He then presented in July with diffuse bilateral infiltrates and acute respiratory failure.  Because he had been diagnosed with HIV/AIDS in the weeks prior, this was felt most consistent with pneumocystis pneumonia.  He was treated appropriately for this, and has seen significant improvement in his breathing.  He feels he is much improved since the hospitalization, and is now off oxygen with adequate saturations today in the office.  He is due back to see infectious disease in approximately 4 weeks.  He has had some lower extremity edema that has slowly improved, and denies any chest discomfort.  He denies any symptoms of chronic aspiration.   Review of Systems  Constitutional: Positive for unexpected weight change. Negative for fever.  HENT: Negative for ear pain, nosebleeds, congestion, sore throat, rhinorrhea, sneezing, trouble swallowing, dental problem, postnasal drip and sinus pressure.   Eyes: Negative for redness and itching.  Respiratory: Positive for shortness of breath. Negative for cough, chest tightness and wheezing.   Cardiovascular: Negative for palpitations and leg swelling.  Gastrointestinal: Negative for nausea and vomiting.  Genitourinary: Negative for dysuria.  Musculoskeletal: Negative for joint swelling.  Skin: Negative for rash.  Neurological: Negative for headaches.  Hematological: Does not bruise/bleed easily.  Psychiatric/Behavioral: Negative for  dysphoric mood. The patient is nervous/anxious.        Objective:   Physical Exam Constitutional:  Well developed, no acute distress  HENT:  Nares patent without discharge  Oropharynx without exudate, palate and uvula are normal.  Minimal thrush noted.   Eyes:  Perrla, eomi, no scleral icterus  Neck:  No JVD, no TMG  Cardiovascular:  Normal rate, regular rhythm, no rubs or gallops.  No murmurs        Intact distal pulses  Pulmonary :  Normal breath sounds, no stridor or respiratory distress   No rales, rhonchi, or wheezing  Abdominal:  Soft, nondistended, bowel sounds present.  No tenderness noted.   Musculoskeletal:  1+ lower extremity edema noted.  Lymph Nodes:  No cervical lymphadenopathy noted  Skin:  No cyanosis noted  Neurologic:  Alert, appropriate, moves all 4 extremities without obvious deficit.         Assessment & Plan:

## 2012-08-19 NOTE — Assessment & Plan Note (Signed)
The pt has had recent severe bilateral infiltrates with acute respiratory failure most c/w PCP clinically.  He has responded very well to appropriate treatment, and is working with ID on treatment of his HIV disease.  His CXR's the past few mos have really shown more streaky atx than true pna, however, I cannot exclude this could have represented slowly progressive PCP that culminated in his respiratory failure in July??  Hopefully, he will not have further pulmonary issues now that he is on therapy with rising counts.  He has f/u with ID clinic, and I would be happy to see him back if persistent issues.

## 2012-08-19 NOTE — Patient Instructions (Addendum)
Finish up treatment for your pneumonia Hopefully, a lot of your issues will resolve on appropriate therapy and when your counts are normalized. Please call if you continue to have pulmonary issues despite having adequate counts/viral loads.

## 2012-08-20 ENCOUNTER — Telehealth: Payer: Self-pay | Admitting: *Deleted

## 2012-08-20 NOTE — Telephone Encounter (Signed)
[name] came in to apply for Patrick Brown.  Called and applied for him.  He has been approved until 08-19-13.

## 2012-08-24 ENCOUNTER — Other Ambulatory Visit: Payer: Self-pay | Admitting: *Deleted

## 2012-08-24 ENCOUNTER — Other Ambulatory Visit (INDEPENDENT_AMBULATORY_CARE_PROVIDER_SITE_OTHER): Payer: BC Managed Care – PPO

## 2012-08-24 DIAGNOSIS — D649 Anemia, unspecified: Secondary | ICD-10-CM

## 2012-08-24 DIAGNOSIS — E119 Type 2 diabetes mellitus without complications: Secondary | ICD-10-CM

## 2012-08-24 LAB — POCT CBC
Granulocyte percent: 89.3 %G — AB (ref 37–80)
HCT, POC: 38.3 % — AB (ref 43.5–53.7)
Hemoglobin: 12.3 g/dL — AB (ref 14.1–18.1)
MCH, POC: 31.4 pg — AB (ref 27–31.2)
MCV: 97.1 fL — AB (ref 80–97)
Platelet Count, POC: 225 10*3/uL (ref 142–424)
RBC: 3.9 M/uL — AB (ref 4.69–6.13)

## 2012-08-24 MED ORDER — DOXYCYCLINE HYCLATE 100 MG PO TABS: 100.0000 mg | ORAL_TABLET | Freq: Two times a day (BID) | ORAL | Status: DC

## 2012-08-25 ENCOUNTER — Telehealth: Payer: Self-pay | Admitting: *Deleted

## 2012-08-25 LAB — BASIC METABOLIC PANEL WITH GFR
BUN: 38 mg/dL — ABNORMAL HIGH (ref 6–23)
CO2: 30 mEq/L (ref 19–32)
Chloride: 92 mEq/L — ABNORMAL LOW (ref 96–112)
Creat: 2.14 mg/dL — ABNORMAL HIGH (ref 0.50–1.35)
GFR, Est Non African American: 37 mL/min — ABNORMAL LOW
Glucose, Bld: 169 mg/dL — ABNORMAL HIGH (ref 70–99)
Potassium: 6.5 mEq/L (ref 3.5–5.3)

## 2012-08-25 NOTE — Telephone Encounter (Signed)
Pt notified of results Dr Sanda Klein office is taking care of the elevated K+

## 2012-08-25 NOTE — Telephone Encounter (Signed)
Message copied by Marin Olp on Tue Aug 25, 2012  1:41 PM ------      Message from: Chipper Herb      Created: Mon Aug 24, 2012  4:24 PM       The CBC has a normal white blood cell count. The hemoglobin is 12.3. This is higher than in the past. Platelets are adequate. Please send a copy of this to his hematologist. ------

## 2012-08-26 ENCOUNTER — Ambulatory Visit (INDEPENDENT_AMBULATORY_CARE_PROVIDER_SITE_OTHER): Payer: BC Managed Care – PPO | Admitting: Family Medicine

## 2012-08-26 ENCOUNTER — Encounter: Payer: Self-pay | Admitting: Family Medicine

## 2012-08-26 VITALS — BP 101/67 | HR 108 | Temp 97.0°F | Ht 70.0 in | Wt 166.0 lb

## 2012-08-26 DIAGNOSIS — R5381 Other malaise: Secondary | ICD-10-CM

## 2012-08-26 DIAGNOSIS — L0291 Cutaneous abscess, unspecified: Secondary | ICD-10-CM

## 2012-08-26 DIAGNOSIS — L0390 Cellulitis, unspecified: Secondary | ICD-10-CM

## 2012-08-26 DIAGNOSIS — E875 Hyperkalemia: Secondary | ICD-10-CM

## 2012-08-26 DIAGNOSIS — B20 Human immunodeficiency virus [HIV] disease: Secondary | ICD-10-CM

## 2012-08-26 DIAGNOSIS — IMO0002 Reserved for concepts with insufficient information to code with codable children: Secondary | ICD-10-CM

## 2012-08-26 DIAGNOSIS — D649 Anemia, unspecified: Secondary | ICD-10-CM

## 2012-08-26 DIAGNOSIS — E1065 Type 1 diabetes mellitus with hyperglycemia: Secondary | ICD-10-CM

## 2012-08-26 DIAGNOSIS — R5383 Other fatigue: Secondary | ICD-10-CM

## 2012-08-26 DIAGNOSIS — J189 Pneumonia, unspecified organism: Secondary | ICD-10-CM

## 2012-08-26 MED ORDER — FLUCONAZOLE 200 MG PO TABS: 200.0000 mg | ORAL_TABLET | Freq: Every day | ORAL | Status: DC

## 2012-08-26 MED ORDER — LORAZEPAM 0.5 MG PO TABS: 0.5000 mg | ORAL_TABLET | Freq: Every day | ORAL | Status: DC

## 2012-08-26 NOTE — Patient Instructions (Signed)
Continue current medication Rest as much as possible and draped linear fluid We will call a prescription in for lorazepam 0.5 #15 one daily as needed no refill Another prescription for nystatin powder for rash A refill on Diflucan to be taken as needed for thrush

## 2012-08-26 NOTE — Progress Notes (Signed)
  Subjective:    Patient ID: Patrick Brown, male    DOB: November 10, 1970, 42 y.o.   MRN: [id number]  HPI Patient returns to clinic today for followup of another bout of pneumonia. He is on prednisone and Zithromax for this. These recurrent bouts of pneumonia are most likely secondary to his deficient immune system. He also was transfused due to a low hemoglobin. He is also hyperkalemic and this is managed with his nephrologist. He sees the hematologist regularly appear     Review of Systems  Constitutional: Positive for fatigue (improving). Negative for fever.  HENT: Positive for congestion, postnasal drip and sinus pressure. Negative for ear pain.   Eyes: Negative.   Respiratory: Positive for cough (non productive). Negative for shortness of breath.   Cardiovascular: Negative for chest pain.  Gastrointestinal: Positive for diarrhea (resolving) and blood in stool (red, possibly due to hemmorhoid).  Genitourinary: Positive for frequency (due to meds). Negative for dysuria.  Musculoskeletal: Positive for myalgias (due to elevated potassium).  Skin: Positive for wound (arms and legs, healing).  Allergic/Immunologic: Positive for environmental allergies (seasonal).  Neurological: Positive for dizziness (resolving) and headaches.  Psychiatric/Behavioral: Positive for sleep disturbance (due to Prednisone). The patient is nervous/anxious (worsening possibly due to Prednisone).        Objective:   Physical Exam  Vitals reviewed. Constitutional: He is oriented to person, place, and time. No distress.  Somewhat frail and fatigued appearing  HENT:  Head: Normocephalic and atraumatic.  Right Ear: External ear normal.  Left Ear: External ear normal.  Nose: Nose normal.  Mouth/Throat: Oropharynx is clear and moist. No oropharyngeal exudate.  Eyes: Conjunctivae are normal. Right eye exhibits no discharge. Left eye exhibits no discharge. No scleral icterus.  Neck: Normal range of motion. Neck supple.  No thyromegaly present.  Cardiovascular: Normal rate, regular rhythm and normal heart sounds.   Pulmonary/Chest: Breath sounds normal. No respiratory distress. He has no wheezes. He has no rales.  Abdominal: Soft. Bowel sounds are normal. He exhibits no distension and no mass. There is no tenderness. There is no rebound and no guarding.  Musculoskeletal: Normal range of motion. He exhibits no edema.  Lymphadenopathy:    He has no cervical adenopathy.  Neurological: He is alert and oriented to person, place, and time. He has normal reflexes.  Skin: Skin is warm and dry. Rash (medial buttock rash bilateral, right inguinal and ulceration) noted. He is not diaphoretic. There is pallor.  Currently being treated for MRSA infection. Rectal area and medial buttocks  Psychiatric: He has a normal mood and affect. His behavior is normal. Judgment and thought content normal.          Assessment & Plan:  1. Hyperkalemia - BASIC METABOLIC PANEL WITH GFR  2. Pneumonia -Improved -Also followed by Dr. Gwenette Greet  3. Fatigue  4. DM (diabetes mellitus), type 1, uncontrolled  5. HIV disease -Patient will keep a regular visit with hematology  6. Anemia  7. Cellulitis - continue topical treatments and doxycycline  Patient Instructions  Continue current medication Rest as much as possible and draped linear fluid We will call a prescription in for lorazepam 0.5 #15 one daily as needed no refill Another prescription for nystatin powder for rash A refill on Diflucan to be taken as needed for thrush   Arrie Senate MD

## 2012-08-27 ENCOUNTER — Telehealth: Payer: Self-pay | Admitting: *Deleted

## 2012-08-27 LAB — BASIC METABOLIC PANEL WITH GFR
CO2: 28 mEq/L (ref 19–32)
Chloride: 93 mEq/L — ABNORMAL LOW (ref 96–112)
Creat: 2.37 mg/dL — ABNORMAL HIGH (ref 0.50–1.35)
Glucose, Bld: 53 mg/dL — ABNORMAL LOW (ref 70–99)

## 2012-08-27 NOTE — Telephone Encounter (Signed)
No elevation in WBC Continue OTC and give it a chance to work Pt notified

## 2012-08-27 NOTE — Telephone Encounter (Signed)
Pt notified of results Copy sent to Dr Lorrene Reid

## 2012-08-27 NOTE — Telephone Encounter (Signed)
Message copied by Marin Olp on Thu Aug 27, 2012 12:00 PM ------      Message from: Chipper Herb      Created: Thu Aug 27, 2012  7:23 AM       The BMP had a sodium that was 134. Potassium 5.6. Chloride 93. Creatinine 2.37.      Please fax copy to Dr. Lorrene Reid      Call patient with results ------

## 2012-08-28 ENCOUNTER — Other Ambulatory Visit: Payer: Self-pay | Admitting: *Deleted

## 2012-08-28 MED ORDER — SIMVASTATIN 40 MG PO TABS: 40.0000 mg | ORAL_TABLET | Freq: Every day | ORAL | Status: DC

## 2012-08-31 ENCOUNTER — Other Ambulatory Visit: Payer: Self-pay | Admitting: Internal Medicine

## 2012-09-01 ENCOUNTER — Telehealth: Payer: Self-pay | Admitting: *Deleted

## 2012-09-01 MED ORDER — NYSTATIN 100000 UNIT/GM EX CREA: TOPICAL_CREAM | CUTANEOUS | Status: DC

## 2012-09-02 ENCOUNTER — Other Ambulatory Visit (INDEPENDENT_AMBULATORY_CARE_PROVIDER_SITE_OTHER): Payer: BC Managed Care – PPO

## 2012-09-02 DIAGNOSIS — E119 Type 2 diabetes mellitus without complications: Secondary | ICD-10-CM

## 2012-09-02 NOTE — Telephone Encounter (Signed)
Pt requested refill

## 2012-09-03 ENCOUNTER — Telehealth: Payer: Self-pay | Admitting: *Deleted

## 2012-09-03 LAB — BASIC METABOLIC PANEL WITH GFR
BUN: 38 mg/dL — ABNORMAL HIGH (ref 6–23)
Calcium: 9.2 mg/dL (ref 8.4–10.5)
Creat: 2.3 mg/dL — ABNORMAL HIGH (ref 0.50–1.35)
GFR, Est African American: 39 mL/min — ABNORMAL LOW
Glucose, Bld: 107 mg/dL — ABNORMAL HIGH (ref 70–99)

## 2012-09-03 NOTE — Telephone Encounter (Signed)
Pt called to notify Dr. Linus Salmons that his yeast infection has come back.  His yeast infection now has drainage, is red, and raw.  He has been taking diflucan in the am and using nystatin cream and powder twice a day. Pt was using adult diapers to "keep clean" of the drainage. RN advised him to stop using those, as they will increase the moisture and decrease the air flow. RN encouraged pt to increase airflow to the area by limiting restrictive clothing, suggesting that the patient wear boxer-type underwear or nothing at all. Pt also encouraged to continue taking his HIV medication, explaining that thrush and yeast are opportunistic infections he would be vulnerable to while his CD4 is low. Pt verbalized understanding. Pt concerned that Doxycycline may be causing this and that the diflucan/nystatin is not strong enough for his infection. Should he change regimens or be seen? Landis Gandy, RN

## 2012-09-03 NOTE — Telephone Encounter (Signed)
Just have him try 400 mg of diflucan (instead of 200) for 10 days and do what you said - avoid moisture, take meds and diabetes control.  Doxy may make it worse but likely needed that for superficial infection and he should be done with that today anyway.

## 2012-09-03 NOTE — Telephone Encounter (Signed)
Patient notified, in agreement. Diflucan has PRN refills on file, pt will contact us if he has any issues refilling it.  Pt will contact his PCP re: necessity of doxy refills.

## 2012-09-04 NOTE — Progress Notes (Signed)
Patient came in for labs only.

## 2012-09-07 ENCOUNTER — Other Ambulatory Visit: Payer: Self-pay | Admitting: Internal Medicine

## 2012-09-08 ENCOUNTER — Telehealth: Payer: Self-pay | Admitting: *Deleted

## 2012-09-08 ENCOUNTER — Ambulatory Visit: Payer: BC Managed Care – PPO | Admitting: Internal Medicine

## 2012-09-08 NOTE — Telephone Encounter (Signed)
Patient called and advised that he is having a rash and low grade fever and wanted to know what he should do. He thinks the rash is a yeast infection, advised he has diflucan and it is getting better just not fast enough for him. He advised no other symptoms but just wanted to know if he should come see the doctor about this. Reminded the patient he has an appt to see the doctor 09/15/12 he advised he will just wait until then. Reminded him he can call the office if anything changes.

## 2012-09-11 ENCOUNTER — Telehealth: Payer: Self-pay | Admitting: Nurse Practitioner

## 2012-09-11 ENCOUNTER — Telehealth: Payer: Self-pay | Admitting: Family Medicine

## 2012-09-11 MED ORDER — AZITHROMYCIN 250 MG PO TABS: ORAL_TABLET | ORAL | Status: DC

## 2012-09-11 NOTE — Telephone Encounter (Signed)
Pt aware zpak sent to pharmacy

## 2012-09-11 NOTE — Telephone Encounter (Signed)
Has a sinus infection wants something called in

## 2012-09-12 ENCOUNTER — Ambulatory Visit (INDEPENDENT_AMBULATORY_CARE_PROVIDER_SITE_OTHER): Payer: BC Managed Care – PPO | Admitting: General Practice

## 2012-09-12 VITALS — BP 119/83 | HR 101 | Temp 97.3°F | Ht 70.0 in | Wt 161.0 lb

## 2012-09-12 DIAGNOSIS — J329 Chronic sinusitis, unspecified: Secondary | ICD-10-CM

## 2012-09-12 MED ORDER — AMOXICILLIN-POT CLAVULANATE 875-125 MG PO TABS: 1.0000 | ORAL_TABLET | Freq: Two times a day (BID) | ORAL | Status: DC

## 2012-09-12 NOTE — Patient Instructions (Addendum)

## 2012-09-12 NOTE — Progress Notes (Signed)
  Subjective:    Patient ID: Patrick Brown, male    DOB: 1970-05-18, 42 y.o.   MRN: [id number]  Sinusitis This is a new problem. The current episode started in the past 7 days. The problem has been gradually worsening since onset. The maximum temperature recorded prior to his arrival was 101 - 101.9 F. The fever has been present for 3 to 4 days. His pain is at a severity of 0/10. Associated symptoms include congestion, coughing, ear pain and sinus pressure. Pertinent negatives include no chills, headaches, shortness of breath or sore throat. Past treatments include acetaminophen.      Review of Systems  Constitutional: Negative for fever and chills.  HENT: Positive for ear pain, congestion and sinus pressure. Negative for sore throat.   Respiratory: Positive for cough. Negative for shortness of breath.   Cardiovascular: Negative for chest pain and palpitations.  Neurological: Negative for dizziness, weakness and headaches.       Objective:   Physical Exam  Constitutional: He is oriented to person, place, and time. He appears well-developed and well-nourished.  HENT:  Head: Normocephalic and atraumatic.  Right Ear: External ear normal.  Left Ear: External ear normal.  Nose: Right sinus exhibits maxillary sinus tenderness and frontal sinus tenderness. Left sinus exhibits maxillary sinus tenderness and frontal sinus tenderness.  Eyes: Conjunctivae and EOM are normal. Pupils are equal, round, and reactive to light.  Neck: Normal range of motion. Neck supple. No thyromegaly present.  Cardiovascular: Normal rate, regular rhythm and normal heart sounds.   Pulmonary/Chest: Effort normal and breath sounds normal. No respiratory distress. He exhibits no tenderness.  Lymphadenopathy:    He has no cervical adenopathy.  Neurological: He is alert and oriented to person, place, and time.  Skin: Skin is warm and dry.  Psychiatric: He has a normal mood and affect.          Assessment & Plan:   1. Sinusitis, chronic - amoxicillin-clavulanate (AUGMENTIN) 875-125 MG per tablet; Take 1 tablet by mouth 2 (two) times daily.  Dispense: 20 tablet; Refill: 0 -Increase fluid intake Motrin or tylenol OTC OTC decongestan New toothbrush in 3 days Proper handwashing Patient and mother verbalized understanding Erby Pian, FNP-C

## 2012-09-15 ENCOUNTER — Encounter (HOSPITAL_COMMUNITY)
Admission: RE | Admit: 2012-09-15 | Discharge: 2012-09-15 | Disposition: A | Discharge status: Home / Self Care | Payer: BC Managed Care – PPO | Source: Ambulatory Visit | Attending: Nephrology | Admitting: Nephrology

## 2012-09-15 ENCOUNTER — Encounter: Payer: Self-pay | Admitting: Internal Medicine

## 2012-09-15 ENCOUNTER — Other Ambulatory Visit (HOSPITAL_COMMUNITY): Payer: Self-pay | Admitting: *Deleted

## 2012-09-15 ENCOUNTER — Ambulatory Visit (INDEPENDENT_AMBULATORY_CARE_PROVIDER_SITE_OTHER): Payer: BC Managed Care – PPO | Admitting: Internal Medicine

## 2012-09-15 ENCOUNTER — Encounter (HOSPITAL_COMMUNITY): Payer: BC Managed Care – PPO

## 2012-09-15 VITALS — BP 107/71 | HR 97 | Temp 98.2°F | Ht 70.0 in | Wt 169.0 lb

## 2012-09-15 DIAGNOSIS — J329 Chronic sinusitis, unspecified: Secondary | ICD-10-CM

## 2012-09-15 DIAGNOSIS — B20 Human immunodeficiency virus [HIV] disease: Secondary | ICD-10-CM

## 2012-09-15 DIAGNOSIS — D638 Anemia in other chronic diseases classified elsewhere: Secondary | ICD-10-CM | POA: Insufficient documentation

## 2012-09-15 DIAGNOSIS — N183 Chronic kidney disease, stage 3 unspecified: Secondary | ICD-10-CM | POA: Insufficient documentation

## 2012-09-15 LAB — COMPLETE METABOLIC PANEL WITH GFR
AST: 17 U/L (ref 0–37)
Alkaline Phosphatase: 109 U/L (ref 39–117)
BUN: 37 mg/dL — ABNORMAL HIGH (ref 6–23)
Creat: 1.85 mg/dL — ABNORMAL HIGH (ref 0.50–1.35)
GFR, Est Non African American: 44 mL/min — ABNORMAL LOW
Glucose, Bld: 259 mg/dL — ABNORMAL HIGH (ref 70–99)
Potassium: 5.4 mEq/L — ABNORMAL HIGH (ref 3.5–5.3)
Total Bilirubin: 0.2 mg/dL — ABNORMAL LOW (ref 0.3–1.2)

## 2012-09-15 LAB — CBC WITH DIFFERENTIAL/PLATELET
Basophils Absolute: 0.1 10*3/uL (ref 0.0–0.1)
Eosinophils Relative: 2 % (ref 0–5)
HCT: 36.7 % — ABNORMAL LOW (ref 39.0–52.0)
Lymphocytes Relative: 23 % (ref 12–46)
Lymphs Abs: 2.1 10*3/uL (ref 0.7–4.0)
MCV: 92.9 fL (ref 78.0–100.0)
Monocytes Absolute: 1 10*3/uL (ref 0.1–1.0)
Neutro Abs: 5.7 10*3/uL (ref 1.7–7.7)
RBC: 3.95 MIL/uL — ABNORMAL LOW (ref 4.22–5.81)
RDW: 16.5 % — ABNORMAL HIGH (ref 11.5–15.5)
WBC: 9 10*3/uL (ref 4.0–10.5)

## 2012-09-15 LAB — POCT HEMOGLOBIN-HEMACUE: Hemoglobin: 10.8 g/dL — ABNORMAL LOW (ref 13.0–17.0)

## 2012-09-15 MED ORDER — EPOETIN ALFA 20000 UNIT/ML IJ SOLN
INTRAMUSCULAR | Status: AC
  Administered 2012-09-15: 17500 [IU] via SUBCUTANEOUS
  Filled 2012-09-15: qty 1

## 2012-09-15 MED ORDER — EPOETIN ALFA 20000 UNIT/ML IJ SOLN: 17500.0000 [IU] | INTRAMUSCULAR | Status: DC

## 2012-09-16 DIAGNOSIS — J329 Chronic sinusitis, unspecified: Secondary | ICD-10-CM | POA: Insufficient documentation

## 2012-09-16 LAB — T-HELPER CELL (CD4) - (RCID CLINIC ONLY): CD4 % Helper T Cell: 2 % — ABNORMAL LOW (ref 33–55)

## 2012-09-16 LAB — IRON AND TIBC: TIBC: 177 ug/dL — ABNORMAL LOW (ref 215–435)

## 2012-09-16 MED ORDER — NYSTATIN 100000 UNIT/GM EX CREA: TOPICAL_CREAM | Freq: Two times a day (BID) | CUTANEOUS | Status: DC

## 2012-09-16 NOTE — Assessment & Plan Note (Signed)
Resolved.  OK to take any antibiotics needed if indicated.

## 2012-09-16 NOTE — Progress Notes (Signed)
  Subjective:    Patient ID: Patrick Brown, male    DOB: [birth year], 42 y.o.   MRN: [id number]  HPI Here for follow up of HIV.  Last seen in clinic with significant SOB and sent in for admission for presumed PCP pneumonia.  Last CD4 70 (up from 20).  Takes Epzicom, Tivicay and denies missed doses.  Continues to have multiple complaints including rash on groin, rash on face and scalp, low energy.  Recently treated for sinusitis.  Also taking dapsone for prophylaxis and azithromycin weekly.  Here again with his mother who has been very involved in his care.  Still with subjective fever.  Poor appetite.  Diarrhea improved.     Review of Systems  Constitutional: Positive for fever, activity change, appetite change and fatigue. Negative for chills.  HENT: Negative for sore throat and trouble swallowing.   Eyes: Negative for visual disturbance.  Respiratory: Negative for cough and shortness of breath.   Cardiovascular: Negative for chest pain and leg swelling.  Gastrointestinal: Negative for nausea, abdominal pain and diarrhea.  Musculoskeletal: Negative for myalgias and arthralgias.  Skin: Positive for rash.  Allergic/Immunologic: Positive for immunocompromised state.  Neurological: Negative for dizziness and headaches.  Hematological: Negative for adenopathy.  Psychiatric/Behavioral: Negative for dysphoric mood.       Objective:   Physical Exam  Constitutional: He is oriented to person, place, and time. He appears well-developed and well-nourished. No distress.  HENT:  Mouth/Throat: Oropharynx is clear and moist. No oropharyngeal exudate.  Eyes: Right eye exhibits no discharge. Left eye exhibits no discharge. No scleral icterus.  Cardiovascular: Normal rate, regular rhythm and normal heart sounds.   No murmur heard. Pulmonary/Chest: Effort normal and breath sounds normal. No respiratory distress. He has no wheezes.  Lymphadenopathy:    He has no cervical adenopathy.  Neurological: He  is alert and oriented to person, place, and time.          Assessment & Plan:

## 2012-09-16 NOTE — Assessment & Plan Note (Signed)
Will check viral load.  CD4 checked and is 50, which is a bit lower. If viral load though undetectable, no concerns, likely just needs more time for immune reconstitution.   All questions answered and assured that it will take months (6 or more) for him to recover from such an advanced state and for many of his complaints to resolve.

## 2012-09-17 LAB — AFB CULTURE, BLOOD

## 2012-09-20 ENCOUNTER — Emergency Department (HOSPITAL_COMMUNITY)
Admission: EM | Admit: 2012-09-20 | Discharge: 2012-09-20 | Disposition: A | Discharge status: Home / Self Care | Payer: BC Managed Care – PPO | Attending: Emergency Medicine | Admitting: Emergency Medicine

## 2012-09-20 ENCOUNTER — Encounter (HOSPITAL_COMMUNITY): Payer: Self-pay | Admitting: Emergency Medicine

## 2012-09-20 DIAGNOSIS — D649 Anemia, unspecified: Secondary | ICD-10-CM | POA: Insufficient documentation

## 2012-09-20 DIAGNOSIS — E1149 Type 2 diabetes mellitus with other diabetic neurological complication: Secondary | ICD-10-CM | POA: Insufficient documentation

## 2012-09-20 DIAGNOSIS — F3289 Other specified depressive episodes: Secondary | ICD-10-CM | POA: Insufficient documentation

## 2012-09-20 DIAGNOSIS — E039 Hypothyroidism, unspecified: Secondary | ICD-10-CM | POA: Insufficient documentation

## 2012-09-20 DIAGNOSIS — K3184 Gastroparesis: Secondary | ICD-10-CM | POA: Insufficient documentation

## 2012-09-20 DIAGNOSIS — H109 Unspecified conjunctivitis: Secondary | ICD-10-CM | POA: Insufficient documentation

## 2012-09-20 DIAGNOSIS — F329 Major depressive disorder, single episode, unspecified: Secondary | ICD-10-CM | POA: Insufficient documentation

## 2012-09-20 DIAGNOSIS — Z21 Asymptomatic human immunodeficiency virus [HIV] infection status: Secondary | ICD-10-CM | POA: Insufficient documentation

## 2012-09-20 DIAGNOSIS — Z794 Long term (current) use of insulin: Secondary | ICD-10-CM | POA: Insufficient documentation

## 2012-09-20 DIAGNOSIS — Z87448 Personal history of other diseases of urinary system: Secondary | ICD-10-CM | POA: Insufficient documentation

## 2012-09-20 DIAGNOSIS — Z8639 Personal history of other endocrine, nutritional and metabolic disease: Secondary | ICD-10-CM | POA: Insufficient documentation

## 2012-09-20 DIAGNOSIS — Z8701 Personal history of pneumonia (recurrent): Secondary | ICD-10-CM | POA: Insufficient documentation

## 2012-09-20 DIAGNOSIS — Z7982 Long term (current) use of aspirin: Secondary | ICD-10-CM | POA: Insufficient documentation

## 2012-09-20 DIAGNOSIS — H539 Unspecified visual disturbance: Secondary | ICD-10-CM | POA: Insufficient documentation

## 2012-09-20 DIAGNOSIS — Z8669 Personal history of other diseases of the nervous system and sense organs: Secondary | ICD-10-CM | POA: Insufficient documentation

## 2012-09-20 DIAGNOSIS — Z862 Personal history of diseases of the blood and blood-forming organs and certain disorders involving the immune mechanism: Secondary | ICD-10-CM | POA: Insufficient documentation

## 2012-09-20 DIAGNOSIS — M1990 Unspecified osteoarthritis, unspecified site: Secondary | ICD-10-CM | POA: Insufficient documentation

## 2012-09-20 DIAGNOSIS — E875 Hyperkalemia: Secondary | ICD-10-CM | POA: Insufficient documentation

## 2012-09-20 DIAGNOSIS — N183 Chronic kidney disease, stage 3 unspecified: Secondary | ICD-10-CM | POA: Insufficient documentation

## 2012-09-20 DIAGNOSIS — K219 Gastro-esophageal reflux disease without esophagitis: Secondary | ICD-10-CM | POA: Insufficient documentation

## 2012-09-20 DIAGNOSIS — E785 Hyperlipidemia, unspecified: Secondary | ICD-10-CM | POA: Insufficient documentation

## 2012-09-20 MED ORDER — PROPARACAINE HCL 0.5 % OP SOLN
1.0000 [drp] | Freq: Once | OPHTHALMIC | Status: AC
  Administered 2012-09-20: 1 [drp] via OPHTHALMIC
  Filled 2012-09-20: qty 15

## 2012-09-20 MED ORDER — FLUORESCEIN SODIUM 1 MG OP STRP
1.0000 | ORAL_STRIP | Freq: Once | OPHTHALMIC | Status: AC
  Administered 2012-09-20: 1 via OPHTHALMIC
  Filled 2012-09-20: qty 1

## 2012-09-20 MED ORDER — TOBRAMYCIN 0.3 % OP SOLN
1.0000 [drp] | Freq: Four times a day (QID) | OPHTHALMIC | Status: DC
  Administered 2012-09-20: 1 [drp] via OPHTHALMIC
  Filled 2012-09-20: qty 5

## 2012-09-20 NOTE — ED Provider Notes (Signed)
CSN: [id number]     Arrival date & time 09/20/12  1037 History     First MD Initiated Contact with Patient 09/20/12 1044     Chief Complaint  Patient presents with  . Eye Pain    Right   (Consider location/radiation/quality/duration/timing/severity/associated sxs/prior Treatment) Patient is a 42 y.o. male presenting with eye pain. The history is provided by the patient. No language interpreter was used.  Eye Pain This is a new problem. The current episode started yesterday. Pertinent negatives include no fever. Associated symptoms comments: He was working on a television set last night and felt something go into his right eye. He washed it out and felt better but had later symptoms of redness, tearing, irritation with matting this morning. He reports a complicated ophthalmologic history with the affected eye including CMV, cataract surgery, other retinitis. .    Past Medical History  Diagnosis Date  . IDDM (insulin dependent diabetes mellitus)     38 years  . Retinopathy     x2  . Proteinuria   . Dyslipidemia   . Anemia, iron deficiency On procrit  . Hematuria, microscopic 10/09    work up negative (Dr. Amalia Hailey)  . Hypothyroidism   . CKD (chronic kidney disease)   . Low HDL (under 40)   . 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 Hyperkalemia   . Depression   . Gastroparesis diabeticorum   . Gastroesophageal reflux disease   . Degenerative arthritis   . HIV positive   . CKD (chronic kidney disease) stage 3, GFR 30-59 ml/min   . SIRS (systemic inflammatory response syndrome)   . CAP (community acquired pneumonia)    Past Surgical History  Procedure Laterality Date  . Eye surgery  [id number]    x2   . Vitrectomy  bilateral  . Insulin pump    .  Colonoscopy    . Esophagogastroduodenoscopy    . Lasik Bilateral   . Cataract extraction Right    Family History  Problem Relation Age of Onset  . Diabetes type I Brother   . Prostate cancer Other   . Dementia Other   . Dementia Other   . Dementia Other    History  Substance Use Topics  . Smoking status: Never Smoker   . Smokeless tobacco: Never Used  . Alcohol Use: No     Comment: Infrequent, less than 1 x per month.    Review of Systems  Constitutional: Negative for fever.  Eyes: Positive for pain, discharge, redness and visual disturbance.    Allergies  Sulfa antibiotics; Ramipril; and Versed  Home Medications   Current Outpatient Rx  Name  Route  Sig  Dispense  Refill  . abacavir-lamiVUDine (EPZICOM) 600-300 MG per tablet   Oral   Take 1 tablet by mouth at bedtime.         Marland Kitchen acetaminophen (TYLENOL) 500 MG tablet   Oral   Take 1,000 mg by mouth 2 (two) times daily.          Marland Kitchen amoxicillin-clavulanate (AUGMENTIN) 875-125 MG per tablet   Oral   Take 1 tablet by mouth 2 (two) times daily.   20 tablet   0   . aspirin 81 MG chewable tablet   Oral   Chew 81 mg by mouth every morning.         Marland Kitchen  azithromycin (ZITHROMAX) 600 MG tablet      TAKE 2 TABLETS BY MOUTH ONCE A WEEK   4 tablet   1   . dapsone 100 MG tablet   Oral   Take 100 mg by mouth every morning.         . dolutegravir (TIVICAY) 50 MG tablet   Oral   Take 50 mg by mouth at bedtime.         Marland Kitchen doxycycline (VIBRA-TABS) 100 MG tablet   Oral   Take 1 tablet (100 mg total) by mouth 2 (two) times daily.   20 tablet   1   . epoetin alfa (EPOGEN,PROCRIT) 25956 UNIT/ML injection   Subcutaneous   Inject 20,000 Units into the skin every 30 (thirty) days.         Marland Kitchen esomeprazole (NEXIUM) 40 MG capsule   Oral   Take 40 mg by mouth daily before breakfast.         . ferrous sulfate 325 (65 FE) MG tablet   Oral   Take 650 mg by mouth daily with breakfast.          . fluconazole  (DIFLUCAN) 200 MG tablet   Oral   Take 1 tablet (200 mg total) by mouth daily.   10 tablet   prn   . furosemide (LASIX) 20 MG tablet   Oral   Take 20 mg by mouth 2 (two) times daily.         . Glucosamine-Chondroit-Vit C-Mn (GLUCOSAMINE 1500 COMPLEX PO)   Oral   Take 1 tablet by mouth 2 (two) times daily.          . hydrOXYzine (ATARAX/VISTARIL) 25 MG tablet   Oral   Take 1 tablet (25 mg total) by mouth every 6 (six) hours as needed for itching.   120 tablet   1   . Insulin Human (INSULIN PUMP) 100 unit/ml SOLN   Subcutaneous   Inject into the skin continuous. He uses Novolog Insulin in pump. Per sliding scale.         . levothyroxine (SYNTHROID, LEVOTHROID) 175 MCG tablet   Oral   Take 1 tablet (175 mcg total) by mouth every morning.   30 tablet   9   . LORazepam (ATIVAN) 0.5 MG tablet   Oral   Take 1 tablet (0.5 mg total) by mouth daily.   15 tablet   0   . metoCLOPramide (REGLAN) 5 MG tablet   Oral   Take 1 tablet (5 mg total) by mouth 3 (three) times daily before meals.   90 tablet   5   . NOVOLOG 100 UNIT/ML injection               . nystatin cream (MYCOSTATIN)      APPLY TOPICALLY 2 (TWO) TIMES DAILY.   30 g   1   . nystatin cream (MYCOSTATIN)   Topical   Apply topically 2 (two) times daily.   30 g   3   . omega-3 acid ethyl esters (LOVAZA) 1 G capsule   Oral   Take 2 capsules (2 g total) by mouth 2 (two) times daily.   120 capsule   5   . ondansetron (ZOFRAN) 4 MG tablet   Oral   Take 1 tablet (4 mg total) by mouth every 8 (eight) hours as needed for nausea.   45 tablet   0   . simvastatin (ZOCOR) 40 MG tablet   Oral   Take  1 tablet (40 mg total) by mouth at bedtime.   30 tablet   4   . sodium bicarbonate 650 MG tablet   Oral   Take 650 mg by mouth every morning.          . sodium polystyrene (KAYEXALATE) 15 GM/60ML suspension   Oral   Take 30 g by mouth once a week.          . testosterone (ANDROGEL) 50 MG/5GM  GEL   Transdermal   Place 5 g onto the skin every morning.          . traMADol (ULTRAM) 50 MG tablet   Oral   Take 1 tablet (50 mg total) by mouth every 6 (six) hours as needed for pain.   60 tablet   0   . venlafaxine XR (EFFEXOR-XR) 37.5 MG 24 hr capsule   Oral   Take 3 capsules (112.5 mg total) by mouth every morning.   30 capsule   0    BP 120/87  Pulse 98  Temp(Src) 98.1 F (36.7 C) (Oral)  Resp 20  Wt 165 lb (74.844 kg)  BMI 23.68 kg/m2  SpO2 97% Physical Exam  Constitutional: He is oriented to person, place, and time. He appears well-developed and well-nourished. No distress.  Eyes:  Right eye with purulent drainage and matting of eye lashes. Conjunctival redness and mild edema. Cornea has opacities right of center and right lateral border consistent with patient's verbalized history of scarring. Fluroscein staining significant across lower border, right to left without visualized ulceration. No foreign body observed. Left eye is unremarkable.   Pulmonary/Chest: Effort normal.  Neurological: He is alert and oriented to person, place, and time.    ED Course   Procedures (including critical care time)  Labs Reviewed - No data to display No results found. No diagnosis found. 1. Conjunctivitis  MDM  Dr. Darl Householder in to see patient for repeat eye exam. Presentation is c/w simple conjunctivitis but will require close ophthalmologic follow up, which the patient and parent will arrange.   Dewaine Oats, PA-C 09/20/12 1207

## 2012-09-20 NOTE — ED Notes (Signed)
Pt alert, arrives from home, c/o right eye pain, onset was last PM, unsure but believes "something flew in it",  States " i used some lubricating eye drops"

## 2012-09-20 NOTE — ED Provider Notes (Signed)
Medical screening examination/treatment/procedure(s) were conducted as a shared visit with non-physician practitioner(s) and myself.  I personally evaluated the patient during the encounter  Patrick Brown is a 42 y.o. male hx of HIV, CMV retinitis, cataracts here with R eye pain. He was working on [employer] yesterday and felt something go into R eye. Increased blurry vision afterwards. On exam, R eye conjunctiva is red. Under fluoresein, he has old corneal ulcer otherwise no obvious foreign body. No foreign body under the eyelid either. Will d/c home on tobramycin eye drops and have him f/u with his eye doctor.    Wandra Arthurs, MD 09/20/12 [phone]

## 2012-09-21 ENCOUNTER — Other Ambulatory Visit: Payer: Self-pay | Admitting: Internal Medicine

## 2012-09-22 ENCOUNTER — Other Ambulatory Visit: Payer: Self-pay | Admitting: Family Medicine

## 2012-09-22 LAB — AFB CULTURE, BLOOD

## 2012-09-23 ENCOUNTER — Encounter: Payer: Self-pay | Admitting: Family Medicine

## 2012-09-23 ENCOUNTER — Ambulatory Visit (INDEPENDENT_AMBULATORY_CARE_PROVIDER_SITE_OTHER): Payer: BC Managed Care – PPO | Admitting: Family Medicine

## 2012-09-23 VITALS — BP 107/68 | HR 100 | Temp 97.2°F | Ht 70.0 in | Wt 167.8 lb

## 2012-09-23 DIAGNOSIS — H109 Unspecified conjunctivitis: Secondary | ICD-10-CM

## 2012-09-23 DIAGNOSIS — D649 Anemia, unspecified: Secondary | ICD-10-CM

## 2012-09-23 DIAGNOSIS — B20 Human immunodeficiency virus [HIV] disease: Secondary | ICD-10-CM

## 2012-09-23 DIAGNOSIS — N183 Chronic kidney disease, stage 3 unspecified: Secondary | ICD-10-CM

## 2012-09-23 DIAGNOSIS — IMO0002 Reserved for concepts with insufficient information to code with codable children: Secondary | ICD-10-CM

## 2012-09-23 DIAGNOSIS — E785 Hyperlipidemia, unspecified: Secondary | ICD-10-CM

## 2012-09-23 DIAGNOSIS — E1065 Type 1 diabetes mellitus with hyperglycemia: Secondary | ICD-10-CM

## 2012-09-23 DIAGNOSIS — E039 Hypothyroidism, unspecified: Secondary | ICD-10-CM

## 2012-09-23 DIAGNOSIS — J309 Allergic rhinitis, unspecified: Secondary | ICD-10-CM

## 2012-09-23 DIAGNOSIS — E119 Type 2 diabetes mellitus without complications: Secondary | ICD-10-CM

## 2012-09-23 LAB — POCT CBC
Granulocyte percent: 67.4 %G (ref 37–80)
Lymph, poc: 2.7 (ref 0.6–3.4)
MPV: 5.8 fL (ref 0–99.8)
POC Granulocyte: 6.9 (ref 2–6.9)
Platelet Count, POC: 354 10*3/uL (ref 142–424)
RBC: 4.2 M/uL — AB (ref 4.69–6.13)
RDW, POC: 18.4 %
WBC: 10.2 10*3/uL (ref 4.6–10.2)

## 2012-09-23 LAB — BASIC METABOLIC PANEL WITH GFR
CO2: 26 mEq/L (ref 19–32)
Chloride: 93 mEq/L — ABNORMAL LOW (ref 96–112)
Glucose, Bld: 228 mg/dL — ABNORMAL HIGH (ref 70–99)
Sodium: 126 mEq/L — ABNORMAL LOW (ref 135–145)

## 2012-09-23 MED ORDER — CYCLOBENZAPRINE HCL 10 MG PO TABS: 10.0000 mg | ORAL_TABLET | Freq: Three times a day (TID) | ORAL | Status: DC | PRN

## 2012-09-23 MED ORDER — TRAMADOL HCL 50 MG PO TABS: 50.0000 mg | ORAL_TABLET | Freq: Four times a day (QID) | ORAL | Status: DC | PRN

## 2012-09-23 NOTE — Addendum Note (Signed)
Addended by: Marin Olp on: 09/23/2012 09:53 AM   Modules accepted: Orders, Medications

## 2012-09-23 NOTE — Telephone Encounter (Signed)
LAST OV 09/23/12. LAST TEST LEVEL 2/14 - 783. WAS SEEN TODAY AND ANDROGEL DOSE IN EPIC IS DIFFERENT THAN WHAT [facility] [place] [facility]ED OVER. LAST RF 08/18/12. PLEASE REVIEW. IF APPROVED PRINT AND LET PT KNOW

## 2012-09-23 NOTE — Progress Notes (Signed)
Subjective:    Patient ID: Patrick Brown, male    DOB: Mar 18, 1970, 42 y.o.   MRN: [id number]  HPI Patient returns to clinic today for followup and management of chronic medical problems. These include insulin-dependent diabetes, hyperlipidemia, hypothyroidism, chronic kidney disease, and GERD. Over the past several months he has had multiple hospitalizations for pneumonia and he has ongoing problems with hyperkalemia and HIV disease for whom he is seeing an infectious disease specialist. See also the review of systems. A recent hemoglobin A1c was 5.5 about one month ago. He has a right conjunctivitis and he is using tobramycin drops 4 times daily and will see the ophthalmologist sometime this week.   Review of Systems  Constitutional: Positive for fatigue (slight).  HENT: Positive for congestion, rhinorrhea, postnasal drip and sinus pressure. Negative for ear pain and sore throat.   Eyes: Positive for discharge (R eye conjunctivitis) and itching.  Respiratory: Negative.  Negative for cough, choking, chest tightness, shortness of breath and wheezing.   Cardiovascular: Negative.  Negative for chest pain, palpitations and leg swelling.  Gastrointestinal: Positive for nausea (intermitent). Negative for abdominal pain, diarrhea, constipation and blood in stool.  Endocrine: Negative for cold intolerance, heat intolerance, polydipsia, polyphagia and polyuria.  Genitourinary: Negative for frequency, hematuria, decreased urine volume, enuresis and testicular pain.  Musculoskeletal: Positive for myalgias (with hyperkalemia, intermitent) and arthralgias (with elevated hyperkalemia, intermitent). Negative for back pain and joint swelling.  Skin: Positive for rash (arms,legs, head, chest) and wound (head, arms, legs).  Allergic/Immunologic: Positive for environmental allergies (seasonal).  Neurological: Negative for dizziness, tremors, syncope, weakness, light-headedness, numbness and headaches.   Hematological: Negative.   Psychiatric/Behavioral: Negative for hallucinations, confusion, sleep disturbance and agitation. The patient is nervous/anxious (stable with meds).        Objective:   Physical Exam  Nursing note and vitals reviewed. Constitutional: He is oriented to person, place, and time. No distress.  Still somewhat frail but improving appearance and demeanor following multiple hospitalizations for pneumonia and infections  HENT:  Head: Normocephalic and atraumatic.  Right Ear: External ear normal.  Left Ear: External ear normal.  Nose: Nose normal.  Mouth/Throat: Oropharynx is clear and moist. No oropharyngeal exudate.  Eyes: EOM are normal. Right eye exhibits no discharge. Left eye exhibits no discharge. No scleral icterus.  Right conjunctivitis currently being treated with antibiotic eyedrops  Neck: Normal range of motion. Neck supple. No thyromegaly present.  Cardiovascular: Normal rate, regular rhythm and normal heart sounds.  Exam reveals no gallop and no friction rub.   No murmur heard. Pulmonary/Chest: Effort normal and breath sounds normal. No respiratory distress. He has no wheezes. He has no rales.  Abdominal: Soft. Bowel sounds are normal. There is no tenderness. There is no rebound.  Musculoskeletal: Normal range of motion. He exhibits edema. He exhibits no tenderness.  Lymphadenopathy:    He has no cervical adenopathy.  Neurological: He is alert and oriented to person, place, and time. He has normal reflexes.  Skin: Skin is warm and dry. Rash noted. There is erythema. No pallor.  Multiple sores on scalp and arms  Psychiatric: He has a normal mood and affect. His behavior is normal. Judgment and thought content normal.          Assessment & Plan:  1. Hyperlipemia  2. DM (diabetes mellitus), type 1, uncontrolled  3. Hypothyroid  4. Chronic kidney disease, stage 3, mod decreased GFR  5. Diabetes - BASIC METABOLIC PANEL WITH GFR  6. Allergic  rhinitis  7. Conjunctivitis -Continue antibiotic eyedrops and followup with ophthalmologist as planned  8. HIV disease -Continue with infectious disease specialist  9. Anemia - POCT CBC  Patient Instructions  Continue current meds and therapeutic lifestyle changes Return to clinic in September or October for a flu shot Keep followup appointments with specialists Continued good respiratory hygiene and drink plenty of fluids   Arrie Senate MD

## 2012-09-23 NOTE — Patient Instructions (Addendum)
Continue current meds and therapeutic lifestyle changes Return to clinic in September or October for a flu shot Keep followup appointments with specialists Continued good respiratory hygiene and drink plenty of fluids

## 2012-09-24 LAB — AFB CULTURE WITH SMEAR (NOT AT ARMC): Acid Fast Smear: NONE SEEN

## 2012-09-28 ENCOUNTER — Other Ambulatory Visit (INDEPENDENT_AMBULATORY_CARE_PROVIDER_SITE_OTHER): Payer: BC Managed Care – PPO

## 2012-09-28 DIAGNOSIS — R7989 Other specified abnormal findings of blood chemistry: Secondary | ICD-10-CM

## 2012-09-28 NOTE — Progress Notes (Signed)
Pt came in for labs only 

## 2012-09-29 ENCOUNTER — Encounter: Payer: Self-pay | Admitting: *Deleted

## 2012-09-29 ENCOUNTER — Telehealth: Payer: Self-pay | Admitting: *Deleted

## 2012-09-29 LAB — BMP8+EGFR
BUN: 30 mg/dL — ABNORMAL HIGH (ref 6–24)
CO2: 25 mmol/L (ref 18–29)
Calcium: 9.5 mg/dL (ref 8.7–10.2)
Chloride: 94 mmol/L — ABNORMAL LOW (ref 97–108)
Glucose: 247 mg/dL — ABNORMAL HIGH (ref 65–99)

## 2012-09-29 NOTE — Progress Notes (Signed)
Pt came in yesterday 09/28/2012 to have blood work drawn. While in the  lab patient was observed to be picking and scratching at the numerous sores and scabs on his arms. They were bleeding and he was observed wiping his hands and fingers on the patient phlebotomy chair that he was sitting in and wiping them on the patient chairs in the lab waiting area. The phlebotomist working disinfected the areas and notified her supervisor and Dr. Laurance Flatten was notified. Dr. Laurance Flatten talked to Patrick Brown with Infectious Disease and requested that at his next office visit with patient that he would review and discuss with Patrick Brown the importance Blood and Body fluid precautions. Dr. Laurance Flatten will also review and discuss this with [name] at his next office visit or his next encounter.

## 2012-09-29 NOTE — Telephone Encounter (Signed)
Message copied by Marin Olp on Tue Sep 29, 2012 12:48 PM ------      Message from: Chipper Herb      Created: Tue Sep 29, 2012  7:28 AM       On the BMP blood sugar was 247, creatinine was 1.84 which is improved from 6 days ago. The potassium is lower at 5.1. Call patient            Lab, please send a copy of this report to Dr. Lorrene Reid and Dr. Linus Salmons ------

## 2012-09-29 NOTE — Telephone Encounter (Signed)
Pt notified of results

## 2012-09-30 ENCOUNTER — Other Ambulatory Visit: Payer: Self-pay | Admitting: Internal Medicine

## 2012-09-30 ENCOUNTER — Other Ambulatory Visit: Payer: Self-pay | Admitting: *Deleted

## 2012-09-30 DIAGNOSIS — B20 Human immunodeficiency virus [HIV] disease: Secondary | ICD-10-CM

## 2012-09-30 MED ORDER — AZITHROMYCIN 600 MG PO TABS: 1200.0000 mg | ORAL_TABLET | ORAL | Status: DC

## 2012-10-01 ENCOUNTER — Encounter: Payer: Self-pay | Admitting: *Deleted

## 2012-10-01 NOTE — Telephone Encounter (Signed)
Opened in error

## 2012-10-01 NOTE — Telephone Encounter (Signed)
Requesting refill on z pack

## 2012-10-13 ENCOUNTER — Other Ambulatory Visit (INDEPENDENT_AMBULATORY_CARE_PROVIDER_SITE_OTHER): Payer: BC Managed Care – PPO

## 2012-10-13 DIAGNOSIS — R7989 Other specified abnormal findings of blood chemistry: Secondary | ICD-10-CM

## 2012-10-13 NOTE — Progress Notes (Signed)
Patient came in labs only

## 2012-10-14 LAB — BMP8+EGFR
BUN/Creatinine Ratio: 16 (ref 9–20)
BUN: 32 mg/dL — ABNORMAL HIGH (ref 6–24)
CO2: 24 mmol/L (ref 18–29)
Calcium: 9.4 mg/dL (ref 8.7–10.2)
Chloride: 94 mmol/L — ABNORMAL LOW (ref 97–108)
Glucose: 222 mg/dL — ABNORMAL HIGH (ref 65–99)

## 2012-10-15 ENCOUNTER — Other Ambulatory Visit: Payer: Self-pay | Admitting: Internal Medicine

## 2012-10-17 ENCOUNTER — Other Ambulatory Visit: Payer: Self-pay | Admitting: Internal Medicine

## 2012-10-17 ENCOUNTER — Other Ambulatory Visit: Payer: Self-pay | Admitting: Family Medicine

## 2012-10-17 DIAGNOSIS — R21 Rash and other nonspecific skin eruption: Secondary | ICD-10-CM

## 2012-10-19 ENCOUNTER — Other Ambulatory Visit (INDEPENDENT_AMBULATORY_CARE_PROVIDER_SITE_OTHER): Payer: BC Managed Care – PPO

## 2012-10-19 DIAGNOSIS — R7989 Other specified abnormal findings of blood chemistry: Secondary | ICD-10-CM

## 2012-10-19 NOTE — Progress Notes (Signed)
Patient came in for labs only.

## 2012-10-19 NOTE — Telephone Encounter (Signed)
Last seen 09/23/12  DWM  If approved route to nurse to call into [facility]  [phone]

## 2012-10-20 LAB — BMP8+EGFR
BUN/Creatinine Ratio: 14 (ref 9–20)
BUN: 23 mg/dL (ref 6–24)
CO2: 27 mmol/L (ref 18–29)
Chloride: 97 mmol/L (ref 97–108)

## 2012-10-21 NOTE — Telephone Encounter (Signed)
Done

## 2012-10-26 ENCOUNTER — Telehealth: Payer: Self-pay | Admitting: Pulmonary Disease

## 2012-10-26 NOTE — Telephone Encounter (Signed)
[email or web address]. Jennifer Castillo, CMA   

## 2012-10-26 NOTE — Telephone Encounter (Signed)
Returning call.Patrick Brown ° °

## 2012-10-26 NOTE — Telephone Encounter (Signed)
lmomtcb x1 for pt 

## 2012-10-27 ENCOUNTER — Encounter (HOSPITAL_COMMUNITY)
Admission: RE | Admit: 2012-10-27 | Discharge: 2012-10-27 | Disposition: A | Discharge status: Home / Self Care | Payer: BC Managed Care – PPO | Source: Ambulatory Visit | Attending: Nephrology | Admitting: Nephrology

## 2012-10-27 DIAGNOSIS — N183 Chronic kidney disease, stage 3 unspecified: Secondary | ICD-10-CM | POA: Insufficient documentation

## 2012-10-27 DIAGNOSIS — D638 Anemia in other chronic diseases classified elsewhere: Secondary | ICD-10-CM | POA: Insufficient documentation

## 2012-10-27 LAB — POCT HEMOGLOBIN-HEMACUE: Hemoglobin: 11.4 g/dL — ABNORMAL LOW (ref 13.0–17.0)

## 2012-10-27 LAB — IRON AND TIBC: TIBC: 218 ug/dL (ref 215–435)

## 2012-10-27 MED ORDER — EPOETIN ALFA 20000 UNIT/ML IJ SOLN
17500.0000 [IU] | INTRAMUSCULAR | Status: DC
  Administered 2012-10-27: 17500 [IU] via SUBCUTANEOUS

## 2012-10-27 MED ORDER — EPOETIN ALFA 20000 UNIT/ML IJ SOLN
INTRAMUSCULAR | Status: AC
  Filled 2012-10-27: qty 1

## 2012-10-27 NOTE — Telephone Encounter (Signed)
[email or web address].Mackenzey Crownover, CMA  

## 2012-10-27 NOTE — Telephone Encounter (Signed)
Spoke with pt.  C/o persistant, dry, hacky cough x 3 wks.  Had 1 brief episode of SOB.  Pt states the cough has been a preceptor to pna in the past.  He denies wheezing, chest tightness, or f/c/s.  We have scheduled him to see TP on tomorrow, Sept 23 at 10:15 am.  Pt aware and is to seek emergency care if needed in the meantime.  He verbalized understanding and voiced no further questions or concerns at this time.

## 2012-10-27 NOTE — Telephone Encounter (Signed)
Returning call can be reached at [phone].[name]

## 2012-10-28 ENCOUNTER — Encounter: Payer: Self-pay | Admitting: Adult Health

## 2012-10-28 ENCOUNTER — Ambulatory Visit (INDEPENDENT_AMBULATORY_CARE_PROVIDER_SITE_OTHER): Payer: BC Managed Care – PPO | Admitting: Adult Health

## 2012-10-28 ENCOUNTER — Ambulatory Visit (INDEPENDENT_AMBULATORY_CARE_PROVIDER_SITE_OTHER)
Admission: RE | Admit: 2012-10-28 | Discharge: 2012-10-28 | Disposition: A | Discharge status: Home / Self Care | Payer: BC Managed Care – PPO | Source: Ambulatory Visit | Attending: Adult Health | Admitting: Adult Health

## 2012-10-28 VITALS — BP 116/74 | HR 95 | Temp 97.6°F | Ht 70.0 in | Wt 182.8 lb

## 2012-10-28 DIAGNOSIS — J189 Pneumonia, unspecified organism: Secondary | ICD-10-CM

## 2012-10-28 DIAGNOSIS — R222 Localized swelling, mass and lump, trunk: Secondary | ICD-10-CM

## 2012-10-28 DIAGNOSIS — R918 Other nonspecific abnormal finding of lung field: Secondary | ICD-10-CM

## 2012-10-28 MED ORDER — LEVOFLOXACIN 750 MG PO TABS: 750.0000 mg | ORAL_TABLET | Freq: Every day | ORAL | Status: DC

## 2012-10-28 NOTE — Patient Instructions (Addendum)
Begin Levaquin 750mg  daily for 7 days .  Mucinex DM Twice daily  As needed  Cough/congestion  Fluids and rest  Follow up in 1-2  weeks with Dr. Gwenette Greet with chest xray  Please contact office for sooner follow up if symptoms do not improve or worsen or seek emergency care    Late Add :  Set up for CT chest -no  Contrast for LUL mass -pt aware

## 2012-10-28 NOTE — Progress Notes (Signed)
  Subjective:    Patient ID: Patrick Brown, male    DOB: 1970-08-31, 42 y.o.   MRN: [id number]  HPI 42 yo male with known hx of Pneumocystis carinii pneumonia , hx of HIV   10/28/2012 Acute OV  Complains of dry, hacking cough x3 weeks which has been a precursor to PNA in the past.  does report 1 episode of SOB 1 week ago.  denies chest congestion, wheezing, f/c/s, nausea/vomiting, hemoptysis.  Continues to follow with ID clinic for HIV, on EPZICOM, TIVICAY on prophylaxis  Dapsone and azithromycin weekly.  He was admitted this summer with presumed PCP PNA (08/2012) .  He denies hemoptysis, chest pain, orthopnea, edema.     Review of Systems Constitutional:   No  weight loss, night sweats,  +Fevers, chills, fatigue, or  lassitude.  HEENT:   No headaches,  Difficulty swallowing,  Tooth/dental problems, or  Sore throat,                No sneezing, itching, ear ache,  +nasal congestion, post nasal drip,   CV:  No chest pain,  Orthopnea, PND, swelling in lower extremities, anasarca, dizziness, palpitations, syncope.   GI  No heartburn, indigestion, abdominal pain, nausea, vomiting, diarrhea, change in bowel habits, loss of appetite, bloody stools.   Resp:    No chest wall deformity  Skin:+scalp rash -chronic   GU: no dysuria, change in color of urine, no urgency or frequency.  No flank pain, no hematuria   MS:  No joint pain or swelling.  No decreased range of motion.  No back pain.  Psych:  No change in mood or affect. No depression or anxiety.  No memory loss.         Objective:   Physical Exam GEN: A/Ox3; pleasant , NAD, well nourished   HEENT:  [place]/AT,  EACs-clear, TMs-wnl, NOSE-clear, THROAT-clear, no lesions, no postnasal drip or exudate noted.   NECK:  Supple w/ fair ROM; no JVD; normal carotid impulses w/o bruits; no thyromegaly or nodules palpated; no lymphadenopathy.  RESP  Clear  P & A; w/o, wheezes/ rales/ or rhonchi.no accessory muscle use, no dullness to  percussion  CARD:  RRR, no m/r/g  , no peripheral edema, pulses intact, no cyanosis or clubbing.  GI:   Soft & nt; nml bowel sounds; no organomegaly or masses detected.  Musco: Warm bil, no deformities or joint swelling noted.   Neuro: alert, no focal deficits noted.    Skin: Warm, no lesions or rashes    CXR 10/28/12 >Increased masslike opacity in the central left upper lobe, which  could be due to pneumonia or bronchogenic carcinoma. Chest CT with  contrast recommended for further evaluation.      Assessment & Plan:

## 2012-10-29 NOTE — Progress Notes (Signed)
Quick Note:  CT scheduled for 9.29.14 Pt has ov w/ [place] 10.7.14 ______

## 2012-10-29 NOTE — Progress Notes (Signed)
Ov reviewed and discussed with NP.  Agree with plan as outlined.

## 2012-10-29 NOTE — Assessment & Plan Note (Addendum)
Left suprahilar masslike opacity ? Etiology  Pt with acute like symptoms (cough/fever/dyspnea)  Will treat for possible PNA in immunosuppressed pt (HIV) hx of PCP PNA  However concerned regarding masslike opacity.   Plan  Begin Levaquin 750mg  daily for 7 days .  Mucinex DM Twice daily  As needed  Cough/congestion  Fluids and rest  Follow up in 1-2  weeks with Dr. Gwenette Greet   Please contact office for sooner follow up if symptoms do not improve or worsen or seek emergency care  Set up for CT chest -non contrast ( scr >2) .

## 2012-10-30 ENCOUNTER — Other Ambulatory Visit: Payer: Self-pay | Admitting: Family Medicine

## 2012-11-01 ENCOUNTER — Other Ambulatory Visit: Payer: Self-pay | Admitting: Physician Assistant

## 2012-11-02 ENCOUNTER — Ambulatory Visit (INDEPENDENT_AMBULATORY_CARE_PROVIDER_SITE_OTHER)
Admission: RE | Admit: 2012-11-02 | Discharge: 2012-11-02 | Disposition: A | Discharge status: Home / Self Care | Payer: BC Managed Care – PPO | Source: Ambulatory Visit | Attending: Adult Health | Admitting: Adult Health

## 2012-11-02 ENCOUNTER — Other Ambulatory Visit: Payer: BC Managed Care – PPO

## 2012-11-02 DIAGNOSIS — R918 Other nonspecific abnormal finding of lung field: Secondary | ICD-10-CM

## 2012-11-02 DIAGNOSIS — R222 Localized swelling, mass and lump, trunk: Secondary | ICD-10-CM

## 2012-11-03 ENCOUNTER — Encounter: Payer: Self-pay | Admitting: Adult Health

## 2012-11-04 ENCOUNTER — Ambulatory Visit (INDEPENDENT_AMBULATORY_CARE_PROVIDER_SITE_OTHER): Payer: Medicaid Other | Admitting: Family Medicine

## 2012-11-04 ENCOUNTER — Encounter: Payer: Self-pay | Admitting: Family Medicine

## 2012-11-04 ENCOUNTER — Telehealth: Payer: Self-pay | Admitting: Pulmonary Disease

## 2012-11-04 VITALS — BP 111/77 | HR 90 | Temp 97.0°F | Ht 70.0 in | Wt 176.0 lb

## 2012-11-04 DIAGNOSIS — E1065 Type 1 diabetes mellitus with hyperglycemia: Secondary | ICD-10-CM

## 2012-11-04 DIAGNOSIS — E349 Endocrine disorder, unspecified: Secondary | ICD-10-CM

## 2012-11-04 DIAGNOSIS — IMO0002 Reserved for concepts with insufficient information to code with codable children: Secondary | ICD-10-CM

## 2012-11-04 DIAGNOSIS — B20 Human immunodeficiency virus [HIV] disease: Secondary | ICD-10-CM

## 2012-11-04 DIAGNOSIS — R197 Diarrhea, unspecified: Secondary | ICD-10-CM

## 2012-11-04 DIAGNOSIS — N183 Chronic kidney disease, stage 3 unspecified: Secondary | ICD-10-CM

## 2012-11-04 DIAGNOSIS — E039 Hypothyroidism, unspecified: Secondary | ICD-10-CM

## 2012-11-04 DIAGNOSIS — D509 Iron deficiency anemia, unspecified: Secondary | ICD-10-CM

## 2012-11-04 DIAGNOSIS — E291 Testicular hypofunction: Secondary | ICD-10-CM

## 2012-11-04 LAB — POCT CBC
Granulocyte percent: 54.5 %G (ref 37–80)
Lymph, poc: 2.9 (ref 0.6–3.4)
MCH, POC: 29.4 pg (ref 27–31.2)
MCV: 89.8 fL (ref 80–97)
Platelet Count, POC: 350 10*3/uL (ref 142–424)
RDW, POC: 17.4 %
WBC: 8.6 10*3/uL (ref 4.6–10.2)

## 2012-11-04 LAB — POCT GLYCOSYLATED HEMOGLOBIN (HGB A1C): Hemoglobin A1C: 6.7

## 2012-11-04 NOTE — Telephone Encounter (Signed)
Spoke with the pt and notified of recs per [facility] He verbalized understanding and nothing further needed

## 2012-11-04 NOTE — Telephone Encounter (Signed)
Result Note    Please let pt know that chest ct shows abnormal area in left upper lung that I suspect is infection. He has been treated with abx, and if better, would just have him keep his f/u with me in October. We may check a cxr again at that time. If he is not better, let me know.  ----  I spoke with pt. He is aware of results. He reports he feels some better but not all the way. He stated he only had the 1 episode of the SOB and none since, still has the intermittent dry cough, slight weakness and fatigue. Denies any wheezing, no chest tx, no fever, no chills, no sweats, no body aches. He takes mucinex BID. He finished the ABX yesterday. He thinks he may need another round to help. Please advise KC thanks  Allergies  Allergen Reactions  . Sulfa Antibiotics Other (See Comments)    High potassium  . Ramipril Cough  . Versed [Midazolam] Other (See Comments)    "I don't wake up very good or clear it out of my system"

## 2012-11-04 NOTE — Telephone Encounter (Signed)
Would like to hold off on further abx.  If he has worsening symptoms before his visit with me, let us know.

## 2012-11-04 NOTE — Patient Instructions (Addendum)
Continue to monitor blood sugars closely Continue to adhere to good therapeutic lifestyle changes which include diet and exercise Keep appointments with pulmonologist nephrologist and ophthalmologist Monitor renal function regularly depending on the previous blood report Continue current medications Always be careful and did not put yourself at risk for falling Take align, the probiotic ,one by mouth daily to see if this will help repopulate your intestinal tract with more normal

## 2012-11-04 NOTE — Progress Notes (Signed)
Subjective:    Patient ID: DIKEMBE ULLAND, male    DOB: [birth year], 42 y.o.   MRN: [id number]  HPI Pt here for follow up and management of chronic medical problems. Patient comes in today with his mom. He has just gotten over another bout of pneumonia. He is seeing a multitude of specialists including the nephrologist, infectious disease specialist, and pulmonologist. He had a recent CT and chest x-ray by the pulmonologist and he is waiting to hear from that report. He also complains of some diarrhea associated with his frequent use of antibiotics because of community-acquired pneumonia. He is going to try some align. The patient is also on testosterone replacement in the form of AndroGel 1.62% 2 pumps daily. He is due a rectal exam today to monitor the prostate and he will also be getting a PSA as part of his blood work.  Patient Active Problem List   Diagnosis Date Noted  . Unspecified sinusitis (chronic) 09/16/2012  . Pneumocystis carinii pneumonia 08/19/2012  . Itch 08/17/2012  . Hypercalcemia 08/09/2012  . Right groin wound 08/09/2012  . Hyperkalemia 08/07/2012  . Acute respiratory failure with hypoxia 08/05/2012  . Hyponatremia 08/05/2012  . HCAP (healthcare-associated pneumonia) 08/05/2012  . SOB (shortness of breath) 07/14/2012  . HIV disease 06/02/2012  . Convulsions/seizures 05/16/2012  . Fever, unspecified 04/18/2012  . SIRS (systemic inflammatory response syndrome) 04/18/2012  . CAP (community acquired pneumonia) 04/18/2012  . Gastroparesis 04/18/2012  . Sinus tachycardia 04/18/2012  . Near syncope 04/16/2012  . Anemia, iron deficiency   . DM (diabetes mellitus), type 1, uncontrolled 05/08/2011  . Hypothyroid 05/08/2011  . Chronic kidney disease, stage 3, mod decreased GFR 12/31/2010   Outpatient Encounter Prescriptions as of 11/04/2012  Medication Sig Dispense Refill  . abacavir-lamiVUDine (EPZICOM) 600-300 MG per tablet Take 1 tablet by mouth at bedtime.      Marland Kitchen  acetaminophen (TYLENOL) 500 MG tablet Take 1,000 mg by mouth 2 (two) times daily.       . ANDROGEL PUMP 20.25 MG/ACT (1.62%) GEL 2 PUMPS DAILY AS DIRECTED  75 g  4  . aspirin 81 MG chewable tablet Chew 81 mg by mouth every morning.      . cyclobenzaprine (FLEXERIL) 10 MG tablet Take 1 tablet (10 mg total) by mouth 3 (three) times daily as needed for muscle spasms.  30 tablet  2  . dapsone 100 MG tablet Take 100 mg by mouth every morning.      . dolutegravir (TIVICAY) 50 MG tablet Take 50 mg by mouth at bedtime.      Marland Kitchen epoetin alfa (EPOGEN,PROCRIT) 28413 UNIT/ML injection Inject 20,000 Units into the skin every 30 (thirty) days.      . ferrous sulfate 325 (65 FE) MG tablet Take 650 mg by mouth daily with breakfast.       . fluconazole (DIFLUCAN) 200 MG tablet Take 1 tablet (200 mg total) by mouth daily.  10 tablet  prn  . furosemide (LASIX) 20 MG tablet Take 20 mg by mouth 2 (two) times daily.      . Glucosamine-Chondroit-Vit C-Mn (GLUCOSAMINE 1500 COMPLEX PO) Take 1 tablet by mouth 2 (two) times daily.       . hydrOXYzine (ATARAX/VISTARIL) 25 MG tablet TAKE 1 TABLET (25 MG TOTAL) BY MOUTH EVERY 6 (SIX) HOURS AS NEEDED FOR ITCHING.  120 tablet  1  . Insulin Human (INSULIN PUMP) 100 unit/ml SOLN Inject into the skin continuous. He uses Novolog Insulin in pump. Per sliding scale.      Marland Kitchen  levothyroxine (SYNTHROID, LEVOTHROID) 175 MCG tablet Take 1 tablet (175 mcg total) by mouth every morning.  30 tablet  9  . LORazepam (ATIVAN) 0.5 MG tablet Take 0.5 mg by mouth every 8 (eight) hours as needed for anxiety.      . metoCLOPramide (REGLAN) 5 MG tablet Take 1 tablet (5 mg total) by mouth 3 (three) times daily before meals.  90 tablet  5  . NEXIUM 40 MG capsule TAKE ONE CAPSULE BY MOUTH EVERY DAY  30 capsule  5  . NOVOLOG 100 UNIT/ML injection Sliding scale per pump      . nystatin cream (MYCOSTATIN) Apply topically 2 (two) times daily.  30 g  3  . omega-3 acid ethyl esters (LOVAZA) 1 G capsule Take 2  capsules (2 g total) by mouth 2 (two) times daily.  120 capsule  5  . ondansetron (ZOFRAN) 4 MG tablet Take 1 tablet (4 mg total) by mouth every 8 (eight) hours as needed for nausea.  45 tablet  0  . simvastatin (ZOCOR) 40 MG tablet Take 1 tablet (40 mg total) by mouth at bedtime.  30 tablet  4  . sodium bicarbonate 650 MG tablet Take 650 mg by mouth every morning.       . sodium polystyrene (KAYEXALATE) 15 GM/60ML suspension Take 30 g by mouth every Tuesday, Thursday, and Saturday at 6 PM.       . traMADol (ULTRAM) 50 MG tablet Take 1 tablet (50 mg total) by mouth every 6 (six) hours as needed for pain.  60 tablet  2  . venlafaxine XR (EFFEXOR-XR) 37.5 MG 24 hr capsule TAKE 3 CAPSULES (112.5 MG TOTAL) BY MOUTH DAILY.  90 capsule  2  . [DISCONTINUED] levofloxacin (LEVAQUIN) 750 MG tablet Take 1 tablet (750 mg total) by mouth daily.  7 tablet  0  . [DISCONTINUED] azithromycin (ZITHROMAX) 600 MG tablet Take 2 tablets (1,200 mg total) by mouth every 7 (seven) days.  10 tablet  3  . [DISCONTINUED] venlafaxine XR (EFFEXOR-XR) 37.5 MG 24 hr capsule Take 3 capsules (112.5 mg total) by mouth every morning.  30 capsule  0   No facility-administered encounter medications on file as of 11/04/2012.      Review of Systems  Constitutional: Negative.   HENT: Negative.   Eyes: Negative.   Respiratory: Positive for cough (dry- positive for pneumonia at pulmonary).   Cardiovascular: Negative.   Gastrointestinal: Negative.   Endocrine: Negative.   Genitourinary: Negative.   Musculoskeletal: Positive for arthralgias (knees and ankle pain). Gait problem: unsteady at times.  Skin: Negative.   Allergic/Immunologic: Negative.   Neurological: Negative.   Hematological: Negative.   Psychiatric/Behavioral: Negative.        Objective:   Physical Exam  Nursing note and vitals reviewed. Constitutional: He is oriented to person, place, and time. No distress.  Somewhat thin and frail for his age. He however  looks better today than in the past.  HENT:  Head: Normocephalic and atraumatic.  Right Ear: External ear normal.  Left Ear: External ear normal.  Nose: Nose normal.  Mouth/Throat: Oropharynx is clear and moist. No oropharyngeal exudate.  Eyes: Conjunctivae and EOM are normal. Pupils are equal, round, and reactive to light. Right eye exhibits no discharge. Left eye exhibits no discharge. No scleral icterus.  Neck: Normal range of motion. Neck supple. No thyromegaly present.  Cardiovascular: Normal rate, regular rhythm and intact distal pulses.  Exam reveals friction rub. Exam reveals no gallop.   No  murmur heard. At 84 per minute  Pulmonary/Chest: Effort normal and breath sounds normal. No respiratory distress. He has no wheezes. He has no rales. He exhibits no tenderness.  Abdominal: Soft. Bowel sounds are normal. He exhibits no distension. There is no tenderness. There is no rebound and no guarding.  Genitourinary: Rectum normal, prostate normal and penis normal.  Groin excoriations are improving  Lymphadenopathy:    He has no cervical adenopathy.  Neurological: He is alert and oriented to person, place, and time. He has normal reflexes.  Skin: Skin is warm and dry. Rash noted. He is not diaphoretic. There is erythema. No pallor.  the skin excoriations of the scalp arms and groin appeared to be healing.  Psychiatric: He has a normal mood and affect. His behavior is normal. Judgment and thought content normal.   BP 111/77  Pulse 90  Temp(Src) 97 F (36.1 C) (Oral)  Ht 5\' 10"  (1.778 m)  Wt 176 lb (79.833 kg)  BMI 25.25 kg/m2        Assessment & Plan:   1. Chronic kidney disease, stage 3, mod decreased GFR   2. DM (diabetes mellitus), type 1, uncontrolled   3. Hypothyroid   4. Anemia, iron deficiency   5. Testosterone deficiency   6. HIV disease    Orders Placed This Encounter  Procedures  . Hepatic function panel  . BMP8+EGFR  . NMR, lipoprofile  . PSA, total and free   . Testosterone,Free and Total  . Vit D  25 hydroxy (rtn osteoporosis monitoring)  . POCT glycosylated hemoglobin (Hb A1C)  . POCT CBC   Patient Instructions  Continue to monitor blood sugars closely Continue to adhere to good therapeutic lifestyle changes which include diet and exercise Keep appointments with pulmonologist nephrologist and ophthalmologist Monitor renal function regularly depending on the previous blood report Continue current medications Always be careful and did not put yourself at risk for falling Take align, the probiotic ,one by mouth daily to see if this will help repopulate your intestinal tract with more normal   Arrie Senate MD

## 2012-11-04 NOTE — Telephone Encounter (Signed)
Patient calling back about the same.   °

## 2012-11-05 LAB — NMR, LIPOPROFILE
HDL Cholesterol by NMR: 34 mg/dL — ABNORMAL LOW (ref >=40)
LDLC SERPL CALC-MCNC: 50 mg/dL (ref <100)
Small LDL Particle Number: 669 nmol/L — ABNORMAL HIGH (ref <=527)
Triglycerides by NMR: 112 mg/dL (ref <150)

## 2012-11-06 LAB — TESTOSTERONE,FREE AND TOTAL: Testosterone, Free: 16.8 pg/mL (ref 6.8–21.5)

## 2012-11-06 LAB — HEPATIC FUNCTION PANEL
ALT: 54 IU/L — ABNORMAL HIGH (ref 0–44)
AST: 51 IU/L — ABNORMAL HIGH (ref 0–40)
Total Protein: 8.1 g/dL (ref 6.0–8.5)

## 2012-11-06 LAB — BMP8+EGFR
BUN: 37 mg/dL — ABNORMAL HIGH (ref 6–24)
CO2: 25 mmol/L (ref 18–29)
Calcium: 10.1 mg/dL (ref 8.7–10.2)
Creatinine, Ser: 2.72 mg/dL — ABNORMAL HIGH (ref 0.76–1.27)
GFR calc Af Amer: 32 mL/min/{1.73_m2} — ABNORMAL LOW (ref >59)
GFR calc non Af Amer: 27 mL/min/{1.73_m2} — ABNORMAL LOW (ref >59)
Glucose: 157 mg/dL — ABNORMAL HIGH (ref 65–99)

## 2012-11-06 LAB — PSA, TOTAL AND FREE
PSA, Free Pct: 17.8 %
PSA, Free: 0.16 ng/mL
PSA: 0.9 ng/mL (ref 0.0–4.0)

## 2012-11-10 ENCOUNTER — Encounter: Payer: Self-pay | Admitting: Pulmonary Disease

## 2012-11-10 ENCOUNTER — Ambulatory Visit (INDEPENDENT_AMBULATORY_CARE_PROVIDER_SITE_OTHER)
Admission: RE | Admit: 2012-11-10 | Discharge: 2012-11-10 | Disposition: A | Discharge status: Home / Self Care | Payer: BC Managed Care – PPO | Source: Ambulatory Visit | Attending: Pulmonary Disease | Admitting: Pulmonary Disease

## 2012-11-10 ENCOUNTER — Ambulatory Visit (INDEPENDENT_AMBULATORY_CARE_PROVIDER_SITE_OTHER): Payer: Medicaid Other | Admitting: Pulmonary Disease

## 2012-11-10 VITALS — BP 90/68 | HR 81 | Temp 97.7°F | Ht 70.0 in | Wt 181.6 lb

## 2012-11-10 DIAGNOSIS — J189 Pneumonia, unspecified organism: Secondary | ICD-10-CM

## 2012-11-10 NOTE — Progress Notes (Signed)
  Subjective:    Patient ID: Patrick Brown, male    DOB: [birth year], 42 y.o.   MRN: [id number]  HPI The patient comes in today for followup of his recent abnormal chest x-ray.  He had presumably PCP in July of this year that required hospitalization, but completely recovered from this.  He then presented to our office approximately 2 weeks ago, and was seen by her nurse practitioner with symptoms consistent with a pulmonary infection.  A chest x-ray showed a left hilar abnormality with air space disease, and he underwent a CT chest that showed a rounded masslike density adjacent to the left hilum that had an air bronchogram within it.  There was also groundglass opacity that extended from this area.  The working diagnosis was pneumonia, and he was treated with a course of Levaquin.  The patient comes in today where he feels that he is definitely improved, with no significant cough and his shortness of breath has resolved.  He remains very fatigued.  He has not had any fevers, chills, or sweats.  He chest x-ray today and shows no groundglass opacity, but his left perihilar density remains.   Review of Systems  Constitutional: Positive for fatigue ( decreased energy). Negative for fever and unexpected weight change.  HENT: Negative for ear pain, nosebleeds, congestion, sore throat, rhinorrhea, sneezing, trouble swallowing, dental problem, postnasal drip and sinus pressure.   Eyes: Negative for redness and itching.  Respiratory: Positive for cough. Negative for chest tightness, shortness of breath and wheezing.   Cardiovascular: Negative for palpitations and leg swelling.  Gastrointestinal: Negative for nausea and vomiting.  Genitourinary: Negative for dysuria.  Musculoskeletal: Negative for joint swelling.  Skin: Negative for rash.  Neurological: Negative for headaches.  Hematological: Does not bruise/bleed easily.  Psychiatric/Behavioral: Negative for dysphoric mood. The patient is not  nervous/anxious.        Objective:   Physical Exam Well-developed male in no acute distress Nose without purulence or discharge noted Neck without lymphadenopathy or thyromegaly Chest completely clear to auscultation, no wheezing Cardiac exam with regular rate and rhythm Lower extremities without edema, cyanosis Alert and oriented, moves all 4 extremities.        Assessment & Plan:

## 2012-11-10 NOTE — Assessment & Plan Note (Signed)
The patient is clinically much improved from his visit 2 weeks ago after a course of antibiotics, but his chest x-ray shows a persistent density medially in the left upper lobe.  On the CT, this had an air bronchogram running through it, making me think it is more consistent with a dense air space disease rather than a mass.  Since the patient is improved clinically, and it has only been 2 weeks from treatment, I think we need to give this more time.  I have offered to proceed with bronchoscopy for cultures and tissue, but the patient agrees with the plan to give this more time to clear.  However, if he has any worsening symptoms, I would proceed to bronchoscopy.  We'll check a chest x-ray in 3 weeks.

## 2012-11-10 NOTE — Patient Instructions (Addendum)
Will check a chest xray in 3 weeks, and will call you with results. Please call me if you start having worsening symptoms.

## 2012-11-10 NOTE — Addendum Note (Signed)
Addended by: Virl Cagey on: 11/10/2012 05:34 PM   Modules accepted: Orders

## 2012-11-13 ENCOUNTER — Telehealth: Payer: Self-pay | Admitting: Family Medicine

## 2012-11-16 ENCOUNTER — Telehealth: Payer: Self-pay | Admitting: Pulmonary Disease

## 2012-11-16 NOTE — Telephone Encounter (Signed)
Noted  

## 2012-11-16 NOTE — Telephone Encounter (Signed)
Discussed with patient

## 2012-11-16 NOTE — Telephone Encounter (Signed)
I spoke with pt. He was calling to let us know that he had 1 episode of SOB on Saturday. He reports he was also was taking a 55 gallon trash bag out. Once he got back to the house he was fine after resting for a second and then his breathing was fine. He stated he is fine. This is FYI for [place]. Will forward to [facility] as an [place]

## 2012-11-19 ENCOUNTER — Ambulatory Visit (INDEPENDENT_AMBULATORY_CARE_PROVIDER_SITE_OTHER): Payer: BC Managed Care – PPO | Admitting: Internal Medicine

## 2012-11-19 ENCOUNTER — Encounter: Payer: Self-pay | Admitting: Internal Medicine

## 2012-11-19 VITALS — BP 111/74 | HR 79 | Temp 97.9°F | Ht 70.0 in | Wt 190.0 lb

## 2012-11-19 DIAGNOSIS — Z5189 Encounter for other specified aftercare: Secondary | ICD-10-CM

## 2012-11-19 DIAGNOSIS — B20 Human immunodeficiency virus [HIV] disease: Secondary | ICD-10-CM

## 2012-11-19 DIAGNOSIS — L299 Pruritus, unspecified: Secondary | ICD-10-CM

## 2012-11-19 DIAGNOSIS — S31109D Unspecified open wound of abdominal wall, unspecified quadrant without penetration into peritoneal cavity, subsequent encounter: Secondary | ICD-10-CM

## 2012-11-19 DIAGNOSIS — M25569 Pain in unspecified knee: Secondary | ICD-10-CM

## 2012-11-19 LAB — BASIC METABOLIC PANEL
CO2: 30 mEq/L (ref 19–32)
Creat: 2.1 mg/dL — ABNORMAL HIGH (ref 0.50–1.35)
Potassium: 4.7 mEq/L (ref 3.5–5.3)
Sodium: 134 mEq/L — ABNORMAL LOW (ref 135–145)

## 2012-11-19 NOTE — Assessment & Plan Note (Signed)
This has improved with improvement of his immune system I suspect

## 2012-11-19 NOTE — Assessment & Plan Note (Signed)
He has some joint and muscle aches and did see an orthopedist. His provider is interested in starting Lyrica. There are no interactions with Lyrica with his HIV medications and from my standpoint he can take Lyrica

## 2012-11-19 NOTE — Assessment & Plan Note (Signed)
Clinically he seems to be doing better. Hopefully his CD4 count is improved and his viral load is undetectable. I will check his labs today.  I did today discussed with him the need to be cautious and in regards to sexual activity and condoms and also with blood exposure including checking his fingerstick glucoses.

## 2012-11-19 NOTE — Progress Notes (Signed)
  Subjective:    Patient ID: Patrick Brown, male    DOB: September 28, 1970, 42 y.o.   MRN: [id number]  HPI Is in for followup of HIV.  He continues on [place] and Epzicom. He is also on dapsone for PCP prophylaxis and azithromycin for MAC prophylaxis. He feels much better and his skin seems to be clearing up with less puritus.  He also has noted significant improvement of his groin rash. He's gained some weight and him and his mother both report that he looks much healthier. He has changed his nephrologist and has seen his pulmonologist who is monitoring him for persistent opacity. His pulmonary symptoms though have significantly improved which was mainly a cough. He denies any missed doses of his medications.   Review of Systems  Constitutional: Positive for fatigue. Negative for fever.  HENT: Negative for sore throat.   Eyes: Negative for visual disturbance.  Respiratory: Negative for shortness of breath.   Cardiovascular: Negative for chest pain and leg swelling.  Gastrointestinal: Negative for nausea, abdominal pain and diarrhea.  Musculoskeletal: Negative for arthralgias and back pain.  Skin: Positive for rash.  Allergic/Immunologic: Positive for immunocompromised state.  Neurological: Negative for dizziness, light-headedness and headaches.  Hematological: Negative for adenopathy.  Psychiatric/Behavioral: The patient is not nervous/anxious.        Objective:   Physical Exam  Constitutional: He is oriented to person, place, and time. He appears well-developed and well-nourished. No distress.  Cardiovascular: Normal rate, regular rhythm and normal heart sounds.   No murmur heard. Pulmonary/Chest: Effort normal and breath sounds normal. No respiratory distress.  Neurological: He is alert and oriented to person, place, and time.  Skin:  Areas of small pinpoint rashes signs of scratching  Psychiatric: He has a normal mood and affect.          Assessment & Plan:

## 2012-11-19 NOTE — Assessment & Plan Note (Signed)
This has improved. He will continue with topical therapy.

## 2012-11-20 LAB — HIV-1 RNA QUANT-NO REFLEX-BLD: HIV-1 RNA Quant, Log: <1.3 {Log} (ref <1.30)

## 2012-11-20 LAB — T-HELPER CELL (CD4) - (RCID CLINIC ONLY)
CD4 % Helper T Cell: 6 % — ABNORMAL LOW (ref 33–55)
CD4 T Cell Abs: 120 /uL — ABNORMAL LOW (ref 400–2700)

## 2012-11-25 ENCOUNTER — Telehealth: Payer: Self-pay | Admitting: *Deleted

## 2012-11-25 MED ORDER — PREGABALIN 50 MG PO CAPS: 50.0000 mg | ORAL_CAPSULE | Freq: Two times a day (BID) | ORAL | Status: DC

## 2012-11-25 NOTE — Telephone Encounter (Signed)
This will be okay for one

## 2012-11-25 NOTE — Telephone Encounter (Signed)
Pt called requesting RX for Lyrica Pt spoke with other Dr Gwenette Greet and Dr Linus Salmons regarding interaction with other meds Per pt, both Drs stated there would be no contraindications

## 2012-11-27 NOTE — Telephone Encounter (Signed)
RX printed and to the front for pt pick up

## 2012-12-01 ENCOUNTER — Other Ambulatory Visit (INDEPENDENT_AMBULATORY_CARE_PROVIDER_SITE_OTHER): Payer: BC Managed Care – PPO

## 2012-12-01 DIAGNOSIS — R7989 Other specified abnormal findings of blood chemistry: Secondary | ICD-10-CM

## 2012-12-01 NOTE — Progress Notes (Signed)
Patient came in for labs only.

## 2012-12-02 ENCOUNTER — Ambulatory Visit (INDEPENDENT_AMBULATORY_CARE_PROVIDER_SITE_OTHER)
Admission: RE | Admit: 2012-12-02 | Discharge: 2012-12-02 | Disposition: A | Discharge status: Home / Self Care | Payer: BC Managed Care – PPO | Source: Ambulatory Visit | Attending: Pulmonary Disease | Admitting: Pulmonary Disease

## 2012-12-02 ENCOUNTER — Encounter (HOSPITAL_COMMUNITY)
Admission: RE | Admit: 2012-12-02 | Discharge: 2012-12-02 | Disposition: A | Discharge status: Home / Self Care | Payer: BC Managed Care – PPO | Source: Ambulatory Visit | Attending: Nephrology | Admitting: Nephrology

## 2012-12-02 DIAGNOSIS — J189 Pneumonia, unspecified organism: Secondary | ICD-10-CM

## 2012-12-02 DIAGNOSIS — N183 Chronic kidney disease, stage 3 unspecified: Secondary | ICD-10-CM | POA: Insufficient documentation

## 2012-12-02 DIAGNOSIS — D638 Anemia in other chronic diseases classified elsewhere: Secondary | ICD-10-CM | POA: Insufficient documentation

## 2012-12-02 LAB — BMP8+EGFR
BUN: 28 mg/dL — ABNORMAL HIGH (ref 6–24)
CO2: 25 mmol/L (ref 18–29)
Calcium: 9.8 mg/dL (ref 8.7–10.2)
Creatinine, Ser: 1.74 mg/dL — ABNORMAL HIGH (ref 0.76–1.27)

## 2012-12-02 LAB — IRON AND TIBC
Saturation Ratios: 24 % (ref 20–55)
TIBC: 249 ug/dL (ref 215–435)

## 2012-12-02 MED ORDER — EPOETIN ALFA 20000 UNIT/ML IJ SOLN
INTRAMUSCULAR | Status: AC
  Filled 2012-12-02: qty 1

## 2012-12-02 MED ORDER — EPOETIN ALFA 20000 UNIT/ML IJ SOLN
17500.0000 [IU] | INTRAMUSCULAR | Status: DC
  Administered 2012-12-02: 17500 [IU] via SUBCUTANEOUS

## 2012-12-03 LAB — POCT HEMOGLOBIN-HEMACUE: Hemoglobin: 10.4 g/dL — ABNORMAL LOW (ref 13.0–17.0)

## 2012-12-05 ENCOUNTER — Other Ambulatory Visit: Payer: Self-pay | Admitting: Family Medicine

## 2012-12-07 ENCOUNTER — Telehealth: Payer: Self-pay | Admitting: Pulmonary Disease

## 2012-12-07 NOTE — Telephone Encounter (Signed)
I called and spoke with pt. I advised him will forward the message to [facility] to advise on results and will call once done. Please advise thanks

## 2012-12-08 ENCOUNTER — Telehealth: Payer: Self-pay | Admitting: Pulmonary Disease

## 2012-12-08 NOTE — Telephone Encounter (Signed)
Done.  See info under imaging.

## 2012-12-08 NOTE — Telephone Encounter (Signed)
Result Notes    Notes Recorded by Kathee Delton, MD on 12/08/2012 at 9:16 AM Discussed case with pt. Has persistent infiltrate density in LUL. Needs FOB with BAL and brushings. Pt is agreeable.   Ashtyn, see if bronch spot next Tues at 730, with fluoro, small scope, mild TB risk, HIV+  ---  I called and spoke with Sharyn Lull. She reports the spot is open for 12/15/12 at 7:30 [facility]. Pt is scheduled. I called and made pt aware. Advised him also MIchelle will call him with recs regarding the procedure. Will forward to [facility] as an [place]

## 2012-12-11 NOTE — Telephone Encounter (Signed)
Gambier is already aware.

## 2012-12-15 ENCOUNTER — Encounter (HOSPITAL_COMMUNITY)
Admission: RE | Disposition: A | Discharge status: Home / Self Care | Payer: Self-pay | Source: Ambulatory Visit | Attending: Pulmonary Disease

## 2012-12-15 ENCOUNTER — Ambulatory Visit (HOSPITAL_COMMUNITY)
Admission: RE | Admit: 2012-12-15 | Discharge: 2012-12-15 | Disposition: A | Discharge status: Home / Self Care | Payer: BC Managed Care – PPO | Source: Ambulatory Visit | Attending: Pulmonary Disease | Admitting: Pulmonary Disease

## 2012-12-15 ENCOUNTER — Ambulatory Visit (HOSPITAL_COMMUNITY): Payer: BC Managed Care – PPO

## 2012-12-15 ENCOUNTER — Ambulatory Visit: Payer: BC Managed Care – PPO | Admitting: Neurology

## 2012-12-15 DIAGNOSIS — R9389 Abnormal findings on diagnostic imaging of other specified body structures: Secondary | ICD-10-CM

## 2012-12-15 DIAGNOSIS — R5381 Other malaise: Secondary | ICD-10-CM | POA: Diagnosis not present

## 2012-12-15 DIAGNOSIS — R222 Localized swelling, mass and lump, trunk: Secondary | ICD-10-CM | POA: Insufficient documentation

## 2012-12-15 DIAGNOSIS — J189 Pneumonia, unspecified organism: Secondary | ICD-10-CM | POA: Diagnosis not present

## 2012-12-15 DIAGNOSIS — R918 Other nonspecific abnormal finding of lung field: Secondary | ICD-10-CM | POA: Insufficient documentation

## 2012-12-15 DIAGNOSIS — R5383 Other fatigue: Secondary | ICD-10-CM | POA: Insufficient documentation

## 2012-12-15 HISTORY — PX: VIDEO BRONCHOSCOPY: SHX5072

## 2012-12-15 LAB — PNEUMOCYSTIS JIROVECI SMEAR BY DFA

## 2012-12-15 LAB — GLUCOSE, CAPILLARY
Glucose-Capillary: 187 mg/dL — ABNORMAL HIGH (ref 70–99)
Glucose-Capillary: 172 mg/dL — ABNORMAL HIGH (ref 70–99)

## 2012-12-15 SURGERY — BRONCHOSCOPY, WITH FLUOROSCOPY
Anesthesia: Moderate Sedation | Laterality: Bilateral

## 2012-12-15 MED ORDER — PHENYLEPHRINE HCL 0.25 % NA SOLN
NASAL | Status: DC | PRN
  Administered 2012-12-15: 2 via NASAL

## 2012-12-15 MED ORDER — PHENYLEPHRINE HCL 0.25 % NA SOLN
1.0000 | Freq: Four times a day (QID) | NASAL | Status: DC | PRN
  Filled 2012-12-15: qty 15

## 2012-12-15 MED ORDER — MIDAZOLAM HCL 10 MG/2ML IJ SOLN
INTRAMUSCULAR | Status: DC | PRN
  Administered 2012-12-15: 5 mg via INTRAVENOUS
  Administered 2012-12-15: 2.5 mg via INTRAVENOUS

## 2012-12-15 MED ORDER — SODIUM CHLORIDE 0.9 % IV SOLN: Freq: Once | INTRAVENOUS | Status: DC

## 2012-12-15 MED ORDER — FENTANYL CITRATE 0.05 MG/ML IJ SOLN
INTRAMUSCULAR | Status: DC | PRN
  Administered 2012-12-15: 50 ug via INTRAVENOUS

## 2012-12-15 MED ORDER — FENTANYL CITRATE 0.05 MG/ML IJ SOLN
INTRAMUSCULAR | Status: AC
  Filled 2012-12-15: qty 4

## 2012-12-15 MED ORDER — MIDAZOLAM HCL 10 MG/2ML IJ SOLN
INTRAMUSCULAR | Status: AC
  Filled 2012-12-15: qty 4

## 2012-12-15 MED ORDER — LIDOCAINE HCL 1 % IJ SOLN
INTRAMUSCULAR | Status: DC | PRN
  Administered 2012-12-15: 6 mg via RESPIRATORY_TRACT

## 2012-12-15 MED ORDER — LIDOCAINE HCL 2 % EX GEL
CUTANEOUS | Status: DC | PRN
  Administered 2012-12-15: 1

## 2012-12-15 MED ORDER — LIDOCAINE HCL 2 % EX GEL
Freq: Once | CUTANEOUS | Status: DC
  Filled 2012-12-15: qty 5

## 2012-12-15 MED ORDER — SODIUM CHLORIDE 0.9 % IV SOLN
INTRAVENOUS | Status: DC
  Administered 2012-12-15: 08:00:00 via INTRAVENOUS

## 2012-12-15 MED ORDER — BUTAMBEN-TETRACAINE-BENZOCAINE 2-2-14 % EX AERO: 1.0000 | INHALATION_SPRAY | Freq: Once | CUTANEOUS | Status: DC

## 2012-12-15 NOTE — Progress Notes (Signed)
Video bronchoscopy performed Intervention bronchial brushings Intervention bronchial washings Pt tolerated procedure well  Kathie Dike RRT

## 2012-12-15 NOTE — H&P (Signed)
Patrick Brown  11/10/2012 4:30 PM   Office Visit  MRN:  [id number]   Description: 42 year old male  Provider: Kathee Delton, MD  Department: [facility]        Diagnoses    CAP (community acquired pneumonia)    -  Primary    14      Reason for Visit    Follow-up    Pt c/o intermittent cough since last OV with TP with no mucus production and decreased energy levels. Pt dx with PNA 10/28/12. Follow up cxr today per TP. Pt would like FLU vax today.        Reason For Visit History Recorded        Current Vitals - Last Recorded    BP Pulse Temp(Src) Ht Wt BMI    90/68 81 97.7 F (36.5 C) (Oral) 5\' 10"  (1.778 m) 82.373 kg (181 lb 9.6 oz) 26.06 kg/m2       SpO2              98%                 Progress Notes    Kathee Delton, MD at 11/10/2012  4:59 PM    Status: Signed               Subjective:       Patient ID: Patrick Brown, male    DOB: Dec 26, 1970, 42 y.o.   MRN: [id number]   HPI The patient comes in today for followup of his recent abnormal chest x-ray.  He had presumably PCP in July of this year that required hospitalization, but completely recovered from this.  He then presented to our office approximately 2 weeks ago, and was seen by her nurse practitioner with symptoms consistent with a pulmonary infection.  A chest x-ray showed a left hilar abnormality with air space disease, and he underwent a CT chest that showed a rounded masslike density adjacent to the left hilum that had an air bronchogram within it.  There was also groundglass opacity that extended from this area.  The working diagnosis was pneumonia, and he was treated with a course of Levaquin.  The patient comes in today where he feels that he is definitely improved, with no significant cough and his shortness of breath has resolved.  He remains very fatigued.  He has not had any fevers, chills, or sweats.  He chest x-ray today and shows no groundglass opacity, but his left perihilar density  remains.     Review of Systems  Constitutional: Positive for fatigue ( decreased energy). Negative for fever and unexpected weight change.  HENT: Negative for ear pain, nosebleeds, congestion, sore throat, rhinorrhea, sneezing, trouble swallowing, dental problem, postnasal drip and sinus pressure.   Eyes: Negative for redness and itching.  Respiratory: Positive for cough. Negative for chest tightness, shortness of breath and wheezing.   Cardiovascular: Negative for palpitations and leg swelling.  Gastrointestinal: Negative for nausea and vomiting.  Genitourinary: Negative for dysuria.  Musculoskeletal: Negative for joint swelling.  Skin: Negative for rash.  Neurological: Negative for headaches.  Hematological: Does not bruise/bleed easily.  Psychiatric/Behavioral: Negative for dysphoric mood. The patient is not nervous/anxious.          Objective:     Physical Exam Well-developed male in no acute distress Nose without purulence or discharge noted Neck without lymphadenopathy or thyromegaly Chest completely clear to auscultation, no wheezing Cardiac exam with regular rate and rhythm  Lower extremities without edema, cyanosis Alert and oriented, moves all 4 extremities.             Assessment & Plan:          Revision History       Date/Time User Action    > 11/10/2012  5:32 PM Kathee Delton, MD Sign      11/10/2012  5:00 PM Ashtyn Madelin Headings, CMA Sign at close encounter              CAP (community acquired pneumonia) - Kathee Delton, MD at 11/10/2012  5:32 PM    Status: Written Related Problem: CAP (community acquired pneumonia)    The patient is clinically much improved from his visit 2 weeks ago after a course of antibiotics, but his chest x-ray shows a persistent density medially in the left upper lobe.  On the CT, this had an air bronchogram running through it, making me think it is more consistent with a dense air space disease rather than a mass.  Since the  patient is improved clinically, and it has only been 2 weeks from treatment, I think we need to give this more time.  I have offered to proceed with bronchoscopy for cultures and tissue, but the patient agrees with the plan to give this more time to clear.  However, if he has any worsening symptoms, I would proceed to bronchoscopy.  We'll check a chest x-ray in 3 weeks.            Encounter-Level Documents:    Electronic signature on 11/10/2012 4:31 PM        Not recorded         Orders Placed This Encounter    Future Labs/Procedures    DG Chest 2 View [IMG36 Custom]    Expected by: 12/01/2012 (Approximate)    Expires: 01/10/2014    DG Chest 2 View [IMG36 Custom]    Expected by: As directed    Expires: 01/10/2014         Patient Instructions    Will check a chest xray in 3 weeks, and will call you with results. Please call me if you start having worsening symptoms.          Patient Instructions History Recorded      Level of Service    PR OFFICE OUTPATIENT VISIT 15 MINUTES [99213]         All Flowsheet Templates (all recorded)    Extended Vitals Flowsheet    Encounter Vitals Flowsheet    Custom Formula Data Flowsheet    Anthropometrics Flowsheet           Referring Provider    Chipper Herb, MD       All Charges for This Encounter    Code Description Service Date Service Provider Modifiers Qty    [phone] PR OFFICE OUTPATIENT VISIT 15 MINUTES 11/10/2012 Kathee Delton, MD   1        Other Encounter Related Information    Allergies & Medications         Problem List         History         Patient-Entered Questionnaires   AVS Reports    Date/Time Report Action User    11/10/2012  5:25 PM After Visit Summary Printed Kathee Delton, MD      Diabetic Foot Exam    No data filed      Diabetic Foot Form -  Detailed    No data filed      Diabetic Foot Exam - Simple    No data filed     I have reviewed the procedure with him in detail, and  answered all questions.  He understands the risk of bleeding, ptx, and respiratory distress.    HIV History    HIV History:   Date of initial visit: 06/02/12  Risk factors for HIV:

## 2012-12-15 NOTE — Op Note (Signed)
Dictation #:  [id number]

## 2012-12-16 ENCOUNTER — Encounter (HOSPITAL_COMMUNITY): Payer: Self-pay | Admitting: Pulmonary Disease

## 2012-12-16 ENCOUNTER — Telehealth: Payer: Self-pay | Admitting: Neurology

## 2012-12-16 ENCOUNTER — Telehealth: Payer: Self-pay | Admitting: Family Medicine

## 2012-12-16 LAB — FUNGAL STAIN

## 2012-12-16 LAB — AFB STAIN: Acid Fast Smear: NONE SEEN

## 2012-12-17 LAB — CULTURE, BAL-QUANTITATIVE

## 2012-12-17 LAB — CULTURE, BAL-QUANTITATIVE W GRAM STAIN

## 2012-12-17 NOTE — Telephone Encounter (Signed)
Pt aware of results 

## 2012-12-18 NOTE — Op Note (Signed)
Patrick Brown, Patrick Brown                ACCOUNT NO.:  [id number]  MEDICAL RECORD NO.:  [id number]  LOCATION:  [facility]                         FACILITY:  [facility]  PHYSICIAN:  Kathee Delton, MD,FCCPDATE OF BIRTH:  [birth year]  DATE OF PROCEDURE:  12/15/2012 DATE OF DISCHARGE:                              OPERATIVE REPORT   PROCEDURE:  Flexible fiberoptic bronchoscopy with video.  OPERATOR:  Kathee Delton, MD,FCCP.  INDICATION FOR PROCEDURE:  Left upper lobe infiltrate/mass of unknown origin and unresponsive to antibiotics.  ANESTHESIA:  Versed 7.5 mg IV, fentanyl 50 mg IV, and topical 1% lidocaine to the vocal cords and airways during the procedure.  DESCRIPTION OF PROCEDURE:  After obtaining informed consent and under close cardiopulmonary monitoring, the above preop anesthesia was given and fiberoptic scope was passed to the right naris and into posterior pharynx, where there were no lesions or other abnormalities seen.  The vocal cords appeared to be within normal limits and moved bilaterally on phonation.  The scope was then passed into the trachea, where it was examined along its entire length down to the level of the carina, all of which was normal.  The left and right tracheobronchial trees were examined serially to the subsegmental level with no endobronchial abnormality being found.  Attention was then paid to the left upper lobe where bronchoalveolar lavage was done from the apical posterior segment of the left upper lobe as well as the anterior segment of the left upper lobe.  This was sent to the usual cytologic and bacteriologic evaluation.  Bronchial brushes were then done under fluoroscopic guidance in the apical subsegment of the left upper lobe and directed as medially as possible.  The brush appeared to be within the abnormal area on fluoroscopy and excellent material was obtained for both micro and cytology.  Overall, the patient tolerated the procedure well and  there were no complications.     Kathee Delton, MD,FCCP     [facility]/MEDQ  D:  12/15/2012  T:  12/16/2012  Job:  [id number]

## 2012-12-24 ENCOUNTER — Telehealth: Payer: Self-pay | Admitting: Pulmonary Disease

## 2012-12-24 MED ORDER — AZELASTINE-FLUTICASONE 137-50 MCG/ACT NA SUSP: 1.0000 | Freq: Two times a day (BID) | NASAL | Status: DC

## 2012-12-24 NOTE — Telephone Encounter (Signed)
If he is having a lot of nasal congestion, let's hope is simply drainage down the back.  Have him take zyrtec 10mg  at bedtime everynight for the next few weeks, and have him come by for dymista sample to take one spray each nostril am and pm to see if helps. Let him know that I spoke with his ID md, and we both agree to wait and see the final cultures, and also see how he does as his immune system improves on medication.

## 2012-12-24 NOTE — Telephone Encounter (Signed)
I called and spoke with pt. He c/o  Increased dry cough since last week when he had bronch. He is having some nasal congestion. No fever, no chills, no increase SOB, no PND. He is requesting recs. Please advise KC thanks  Allergies  Allergen Reactions  . Sulfa Antibiotics Other (See Comments)    High potassium  . Ramipril Cough  . Versed [Midazolam] Other (See Comments)    "I don't wake up very good or clear it out of my system"

## 2012-12-24 NOTE — Telephone Encounter (Signed)
Called and spoke with pt and he is aware of [place] recs to try the zyrtec 10 mg at bedtime everynight.  He will stop by and pick up the sample of the dymista to use bid.  Pt voiced his understanding of [place] recs and nothing further is needed.

## 2012-12-28 ENCOUNTER — Other Ambulatory Visit (INDEPENDENT_AMBULATORY_CARE_PROVIDER_SITE_OTHER): Payer: BC Managed Care – PPO

## 2012-12-28 DIAGNOSIS — R7989 Other specified abnormal findings of blood chemistry: Secondary | ICD-10-CM

## 2012-12-28 NOTE — Progress Notes (Signed)
Patient came in for labs only.

## 2012-12-29 ENCOUNTER — Other Ambulatory Visit (HOSPITAL_COMMUNITY): Payer: Self-pay | Admitting: *Deleted

## 2012-12-29 LAB — BMP8+EGFR
BUN/Creatinine Ratio: 17 (ref 9–20)
BUN: 31 mg/dL — ABNORMAL HIGH (ref 6–24)
CO2: 24 mmol/L (ref 18–29)
Creatinine, Ser: 1.83 mg/dL — ABNORMAL HIGH (ref 0.76–1.27)
GFR calc Af Amer: 51 mL/min/{1.73_m2} — ABNORMAL LOW (ref >59)
GFR calc non Af Amer: 44 mL/min/{1.73_m2} — ABNORMAL LOW (ref >59)
Potassium: 4.7 mmol/L (ref 3.5–5.2)

## 2012-12-30 ENCOUNTER — Encounter (HOSPITAL_COMMUNITY)
Admission: RE | Admit: 2012-12-30 | Discharge: 2012-12-30 | Disposition: A | Discharge status: Home / Self Care | Payer: BC Managed Care – PPO | Source: Ambulatory Visit | Attending: Nephrology | Admitting: Nephrology

## 2012-12-30 DIAGNOSIS — D638 Anemia in other chronic diseases classified elsewhere: Secondary | ICD-10-CM | POA: Insufficient documentation

## 2012-12-30 DIAGNOSIS — N183 Chronic kidney disease, stage 3 unspecified: Secondary | ICD-10-CM | POA: Insufficient documentation

## 2012-12-30 LAB — IRON AND TIBC
Saturation Ratios: 53 % (ref 20–55)
TIBC: 244 ug/dL (ref 215–435)
UIBC: 115 ug/dL — ABNORMAL LOW (ref 125–400)

## 2012-12-30 LAB — POCT HEMOGLOBIN-HEMACUE: Hemoglobin: 11.3 g/dL — ABNORMAL LOW (ref 13.0–17.0)

## 2012-12-30 MED ORDER — FERUMOXYTOL INJECTION 510 MG/17 ML
INTRAVENOUS | Status: AC
  Filled 2012-12-30: qty 17

## 2012-12-30 MED ORDER — EPOETIN ALFA 20000 UNIT/ML IJ SOLN
INTRAMUSCULAR | Status: AC
  Filled 2012-12-30: qty 1

## 2012-12-30 MED ORDER — FERUMOXYTOL INJECTION 510 MG/17 ML
510.0000 mg | Freq: Once | INTRAVENOUS | Status: AC
  Administered 2012-12-30: 510 mg via INTRAVENOUS

## 2012-12-30 MED ORDER — SODIUM CHLORIDE 0.9 % IV SOLN
Freq: Once | INTRAVENOUS | Status: AC
  Administered 2012-12-30: 11:00:00 via INTRAVENOUS

## 2012-12-30 MED ORDER — EPOETIN ALFA 20000 UNIT/ML IJ SOLN
17500.0000 [IU] | INTRAMUSCULAR | Status: DC
  Administered 2012-12-30: 17500 [IU] via SUBCUTANEOUS

## 2013-01-01 ENCOUNTER — Other Ambulatory Visit: Payer: Self-pay | Admitting: Internal Medicine

## 2013-01-04 ENCOUNTER — Ambulatory Visit (INDEPENDENT_AMBULATORY_CARE_PROVIDER_SITE_OTHER): Payer: BC Managed Care – PPO | Admitting: Family Medicine

## 2013-01-04 ENCOUNTER — Ambulatory Visit (INDEPENDENT_AMBULATORY_CARE_PROVIDER_SITE_OTHER): Payer: BC Managed Care – PPO

## 2013-01-04 VITALS — BP 123/85 | HR 99 | Temp 97.4°F | Ht 70.0 in | Wt 200.0 lb

## 2013-01-04 DIAGNOSIS — B20 Human immunodeficiency virus [HIV] disease: Secondary | ICD-10-CM

## 2013-01-04 DIAGNOSIS — M79609 Pain in unspecified limb: Secondary | ICD-10-CM

## 2013-01-04 DIAGNOSIS — M79674 Pain in right toe(s): Secondary | ICD-10-CM

## 2013-01-04 DIAGNOSIS — M109 Gout, unspecified: Secondary | ICD-10-CM

## 2013-01-04 MED ORDER — PREDNISONE 50 MG PO TABS: ORAL_TABLET | ORAL | Status: DC

## 2013-01-04 NOTE — Progress Notes (Signed)
   Subjective:    Patient ID: Patrick Brown, male    DOB: [birth year], 42 y.o.   MRN: [id number]  HPI R great toe pain x 2 weeks  Baseline hx/o IDDM, HIV ( CD 4 count 120 11/2012), PCP.  Has had progressive onset of R great toe pain and redness.  No known trauma.  No fever or chills.  Pain is fairly constant.  Also with mild R knee pain.  Was seen by ortho for this issue 2 months ago.  Was put on lyrica per pt.  Pain not worse than last visit.     Review of Systems  All other systems reviewed and are negative.       Objective:   Physical Exam  Constitutional: He appears well-developed and well-nourished.  HENT:  Head: Normocephalic and atraumatic.  Eyes: Conjunctivae are normal. Pupils are equal, round, and reactive to light.  Neck: Normal range of motion.  Cardiovascular: Normal rate, regular rhythm and normal heart sounds.   Pulmonary/Chest: Effort normal and breath sounds normal.  Abdominal: Bowel sounds are normal.  Musculoskeletal:       Feet:  Neurological: He is alert.  Skin: Skin is warm.     [facility] reading (PRIMARY) by  Dr. Ernestina Patches Preliminary read with no infectious/osteomyelitic changes though with some mild degeneration of 1st MTP joint. .                                        Assessment & Plan:  Great toe pain, right - Plan: DG Toe Great Right, POCT CBC, Uric acid  HIV disease - Plan: POCT CBC  Podagra - Plan: predniSONE (DELTASONE) 50 MG tablet  Exam and history clinically consistent with gout.  Ddx also includes infectious/inflammatoryprocess given baseline HIV with lower CD4 counts.  Afebrile today.  No leukocytosis on CBC Will place on short course of prednisone for treatment (colchichine and indomethacin relatively contraindicated given stage 3 CKD).  Check uric acid level, though poor correlation in active gout flares.  Watch sugars closely. Plan for ortho follow up later in the week.  Discussed general, infectious and [facility] red flags    Follow up as needed.

## 2013-01-05 ENCOUNTER — Other Ambulatory Visit: Payer: Self-pay | Admitting: Licensed Clinical Social Worker

## 2013-01-05 DIAGNOSIS — B20 Human immunodeficiency virus [HIV] disease: Secondary | ICD-10-CM

## 2013-01-05 LAB — CBC WITH DIFFERENTIAL
Basos: 1 %
Eos: 9 %
Eosinophils Absolute: 0.9 10*3/uL — ABNORMAL HIGH (ref 0.0–0.4)
Hemoglobin: 12.1 g/dL — ABNORMAL LOW (ref 12.6–17.7)
Immature Grans (Abs): 0 10*3/uL (ref 0.0–0.1)
Lymphs: 34 %
MCH: 30.8 pg (ref 26.6–33.0)
MCHC: 32.6 g/dL (ref 31.5–35.7)
Monocytes Absolute: 1.2 10*3/uL — ABNORMAL HIGH (ref 0.1–0.9)
Neutrophils Absolute: 4.3 10*3/uL (ref 1.4–7.0)
Neutrophils Relative %: 44 %
WBC: 9.9 10*3/uL (ref 3.4–10.8)

## 2013-01-05 LAB — BMP8+EGFR
BUN/Creatinine Ratio: 16 (ref 9–20)
BUN: 27 mg/dL — ABNORMAL HIGH (ref 6–24)
CO2: 24 mmol/L (ref 18–29)
Calcium: 9.7 mg/dL (ref 8.7–10.2)
Chloride: 96 mmol/L — ABNORMAL LOW (ref 97–108)
Creatinine, Ser: 1.64 mg/dL — ABNORMAL HIGH (ref 0.76–1.27)
GFR calc Af Amer: 58 mL/min/{1.73_m2} — ABNORMAL LOW (ref >59)
GFR calc non Af Amer: 50 mL/min/{1.73_m2} — ABNORMAL LOW (ref >59)
Glucose: 184 mg/dL — ABNORMAL HIGH (ref 65–99)
Potassium: 4.9 mmol/L (ref 3.5–5.2)
Sodium: 137 mmol/L (ref 134–144)

## 2013-01-05 LAB — URIC ACID: Uric Acid: 13.4 mg/dL — ABNORMAL HIGH (ref 3.7–8.6)

## 2013-01-05 MED ORDER — DOLUTEGRAVIR SODIUM 50 MG PO TABS: 50.0000 mg | ORAL_TABLET | Freq: Every day | ORAL | Status: DC

## 2013-01-05 MED ORDER — ABACAVIR SULFATE-LAMIVUDINE 600-300 MG PO TABS: 1.0000 | ORAL_TABLET | Freq: Every day | ORAL | Status: DC

## 2013-01-06 ENCOUNTER — Telehealth: Payer: Self-pay | Admitting: *Deleted

## 2013-01-06 NOTE — Telephone Encounter (Signed)
Pt.notified

## 2013-01-06 NOTE — Telephone Encounter (Signed)
Message copied by Priscille Heidelberg on Wed Jan 06, 2013 11:37 AM ------      Message from: Chipper Herb      Created: Tue Dec 29, 2012  7:22 AM       The blood sugar is 120, creatinine is 1.834 weeks ago was 1.74. Potassium and electrolytes are within normal limits++++++++ please make sure that Dr. Lorrene Reid gets a copy of this report ------

## 2013-01-07 ENCOUNTER — Telehealth: Payer: Self-pay | Admitting: Family Medicine

## 2013-01-07 NOTE — Telephone Encounter (Signed)
Prednisone has helped a lot with the flare up. Tomorrow is his last dose. Mother wanted clarification on when allopurinol/uloric treatment will begin. Will we wait 6-8 weeks after the flare has resolved before prescribing this? If the prednisone doesn't completley resolve the flare up what should he do then?

## 2013-01-10 ENCOUNTER — Other Ambulatory Visit: Payer: Self-pay | Admitting: Family Medicine

## 2013-01-11 NOTE — Telephone Encounter (Signed)
Will need to start colchicine/uloric 6-8 weeks after flare has resolved Will need to be started with prednisone as to avoid another flare when start gout management medication.

## 2013-01-12 ENCOUNTER — Other Ambulatory Visit: Payer: Self-pay

## 2013-01-12 DIAGNOSIS — R569 Unspecified convulsions: Secondary | ICD-10-CM

## 2013-01-12 LAB — FUNGUS CULTURE W SMEAR: Fungal Smear: NONE SEEN

## 2013-01-12 NOTE — Telephone Encounter (Signed)
Last seen 11/04/12, last filled 11/25/12. Route to pool A if approved call into [facility]

## 2013-01-12 NOTE — Telephone Encounter (Signed)
Gout is better but hasn't resolved. If he rests he doesn't have any problems but if he is up on his knee for an extended period it bothers him.

## 2013-01-13 ENCOUNTER — Other Ambulatory Visit: Payer: Self-pay | Admitting: Family Medicine

## 2013-01-13 DIAGNOSIS — M109 Gout, unspecified: Secondary | ICD-10-CM

## 2013-01-13 MED ORDER — PREDNISONE 50 MG PO TABS: ORAL_TABLET | ORAL | Status: DC

## 2013-01-13 NOTE — Telephone Encounter (Signed)
Patient's mother will relay the message.

## 2013-01-13 NOTE — Telephone Encounter (Signed)
This is okay to refill 

## 2013-01-13 NOTE — Telephone Encounter (Signed)
Refilled Lyrica with pharmacist at [place]

## 2013-01-13 NOTE — Telephone Encounter (Signed)
Will call in extended course of prednisone

## 2013-01-18 ENCOUNTER — Ambulatory Visit (INDEPENDENT_AMBULATORY_CARE_PROVIDER_SITE_OTHER): Payer: BC Managed Care – PPO | Admitting: Neurology

## 2013-01-18 ENCOUNTER — Encounter: Payer: Self-pay | Admitting: Neurology

## 2013-01-18 VITALS — BP 144/80 | HR 98 | Ht 71.0 in | Wt 206.0 lb

## 2013-01-18 DIAGNOSIS — R569 Unspecified convulsions: Secondary | ICD-10-CM

## 2013-01-18 NOTE — Progress Notes (Signed)
Reason for visit: Seizure  Patrick Brown is an 42 y.o. male  History of present illness:  Patrick Brown is a 42 year old right-handed white male with a history of diabetes, chronic renal insufficiency, and a seizure event associated with hypoglycemia. The patient has not had any further seizures since last seen. The patient recently has had high blood sugars secondary to the use of prednisone to treat a gout attack. The patient still has some episodes of hypoglycemia, but these events are not severe. The patient is back to driving at this point. The EEG study was normal. MRI evaluation the brain was unremarkable. The patient has not required medications for seizures. The patient returns to this office for an evaluation.  Past Medical History  Diagnosis Date  . IDDM (insulin dependent diabetes mellitus)     38 years  . Retinopathy     x2  . Proteinuria   . Dyslipidemia   . Anemia, iron deficiency On procrit  . Hematuria, microscopic 10/09    work up negative (Dr. Amalia Hailey)  . Hypothyroidism   . CKD (chronic kidney disease)   . Low HDL (under 40)   . 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 Hyperkalemia   . Depression   . Gastroparesis diabeticorum   . Gastroesophageal reflux disease   . Degenerative arthritis   . HIV positive   . CKD (chronic kidney disease) stage 3, GFR 30-59 ml/min   . SIRS (systemic inflammatory response syndrome)   . CAP (community acquired pneumonia)     Past Surgical History  Procedure Laterality Date  . Eye surgery  [id number]    x2   . Vitrectomy  bilateral  . Insulin pump    . Colonoscopy    . Esophagogastroduodenoscopy    . Lasik Bilateral   . Cataract extraction Right   . Video bronchoscopy Bilateral 12/15/2012   Procedure: VIDEO BRONCHOSCOPY WITH FLUORO;  Surgeon: Kathee Delton, MD;  Location: [facility];  Service: Cardiopulmonary;  Laterality: Bilateral;    Family History  Problem Relation Age of Onset  . Diabetes type I Brother   . Prostate cancer Other   . Dementia Other   . Dementia Other   . Dementia Other     Social history:  reports that he has never smoked. He has never used smokeless tobacco. He reports that he does not drink alcohol or use illicit drugs.    Allergies  Allergen Reactions  . Sulfa Antibiotics Other (See Comments)    High potassium  . Ramipril Cough  . Versed [Midazolam] Other (See Comments)    "I don't wake up very good or clear it out of my system"    Medications:  Current Outpatient Prescriptions on File Prior to Visit  Medication Sig Dispense Refill  . abacavir-lamiVUDine (EPZICOM) 600-300 MG per tablet Take 1 tablet by mouth at bedtime.  30 tablet  2  . acetaminophen (TYLENOL) 500 MG tablet Take 1,000 mg by mouth 2 (two) times daily.       . ANDROGEL PUMP 20.25 MG/ACT (1.62%) GEL 2 PUMPS DAILY AS DIRECTED  75 g  4  . aspirin 81 MG chewable tablet Chew 81 mg by mouth every morning.      . Azelastine-Fluticasone (DYMISTA) 137-50 MCG/ACT SUSP Place 1 spray into the nose 2 (  two) times daily.  6 g  0  . azithromycin (ZITHROMAX) 600 MG tablet Take 1,200 mg by mouth once a week. On Fridays      . cyclobenzaprine (FLEXERIL) 10 MG tablet Take 1 tablet (10 mg total) by mouth 3 (three) times daily as needed for muscle spasms.  30 tablet  2  . dapsone 100 MG tablet TAKE 1 TABLET (100 MG TOTAL) BY MOUTH DAILY.  30 tablet  5  . dolutegravir (TIVICAY) 50 MG tablet Take 1 tablet (50 mg total) by mouth at bedtime.  30 tablet  2  . epoetin alfa (EPOGEN,PROCRIT) 16109 UNIT/ML injection Inject 20,000 Units into the skin every 30 (thirty) days.      . ferrous sulfate 325 (65 FE) MG tablet Take 650 mg by mouth daily with breakfast.       . fludrocortisone (FLORINEF) 0.1mg /mL  SUSP Take 0.1 mg by mouth daily.      . furosemide (LASIX) 20 MG tablet Take 20 mg by mouth 2 (two) times daily.      . Glucosamine-Chondroit-Vit C-Mn (GLUCOSAMINE 1500 COMPLEX PO) Take 1 tablet by mouth 2 (two) times daily.       . hydrOXYzine (ATARAX/VISTARIL) 25 MG tablet TAKE 1 TABLET (25 MG TOTAL) BY MOUTH EVERY 6 (SIX) HOURS AS NEEDED FOR ITCHING.  120 tablet  1  . Insulin Human (INSULIN PUMP) 100 unit/ml SOLN Inject into the skin continuous. He uses Novolog Insulin in pump. Per sliding scale.      . levothyroxine (SYNTHROID, LEVOTHROID) 175 MCG tablet Take 200 mcg by mouth every morning.      Marland Kitchen LORazepam (ATIVAN) 0.5 MG tablet Take 0.5 mg by mouth every 8 (eight) hours as needed for anxiety.      Marland Kitchen LYRICA 50 MG capsule TAKE ONE CAPSULE BY MOUTH 2 TIMES DAILY  60 capsule  1  . metoCLOPramide (REGLAN) 5 MG tablet Take 1 tablet (5 mg total) by mouth 3 (three) times daily before meals.  90 tablet  5  . NEXIUM 40 MG capsule TAKE ONE CAPSULE BY MOUTH EVERY DAY  30 capsule  5  . NOVOLOG 100 UNIT/ML injection USE PER PUMP AS DIRECTED  60 mL  2  . omega-3 acid ethyl esters (LOVAZA) 1 G capsule Take 2 capsules (2 g total) by mouth 2 (two) times daily.  120 capsule  5  . ondansetron (ZOFRAN) 4 MG tablet Take 1 tablet (4 mg total) by mouth every 8 (eight) hours as needed for nausea.  45 tablet  0  . predniSONE (DELTASONE) 50 MG tablet 1 tab daily x 7 days, then 1/2 tab for 6 days, then 1/4 tab for 4 days  13 tablet  0  . simvastatin (ZOCOR) 40 MG tablet Take 1 tablet (40 mg total) by mouth at bedtime.  30 tablet  4  . sodium bicarbonate 650 MG tablet Take 650 mg by mouth every morning.       . traMADol (ULTRAM) 50 MG tablet Take 1 tablet (50 mg total) by mouth every 6 (six) hours as needed for pain.  60 tablet  2  . venlafaxine XR (EFFEXOR-XR) 37.5 MG 24 hr capsule TAKE 3 CAPSULES (112.5 MG TOTAL) BY MOUTH DAILY.  90 capsule  2   No current facility-administered medications on file prior to visit.     ROS:  Out of a complete 14 system review of symptoms, the patient complains only of the following symptoms, and all other reviewed systems are negative.  Anemia Increased thirst Joint pain, joint swelling  Blood pressure 144/80, pulse 98, height 5\' 11"  (1.803 m), weight 206 lb (93.441 kg).  Physical Exam  General: The patient is alert and cooperative at the time of the examination.  Skin: No significant peripheral edema is noted.   Neurologic Exam  Mental status: The patient is oriented x 3.  Cranial nerves: Facial symmetry is present. Speech is normal, no aphasia or dysarthria is noted. Extraocular movements are full. Visual fields are full.  Motor: The patient has good strength in all 4 extremities.  Sensory examination: Soft touch sensation is symmetric on the face, arms, and legs.  Coordination: The patient has good finger-nose-finger and heel-to-shin bilaterally.  Gait and station: The patient has a normal gait. Tandem gait is normal. Romberg is negative. No drift is seen.  Reflexes: Deep tendon reflexes are symmetric, but are depressed.   Assessment/Plan:  1. Diabetes  2. Seizure associated with hypoglycemia  The patient is doing relatively well at this point. He will followup through this office if needed. The patient likely had a symptomatic seizure associated with a hypoglycemic event.  Jill Alexanders MD 01/18/2013 8:51 AM  [facility] [address] [place],  [address]  Phone [phone] Fax [phone]

## 2013-01-20 ENCOUNTER — Other Ambulatory Visit (INDEPENDENT_AMBULATORY_CARE_PROVIDER_SITE_OTHER): Payer: BC Managed Care – PPO

## 2013-01-20 DIAGNOSIS — R7989 Other specified abnormal findings of blood chemistry: Secondary | ICD-10-CM

## 2013-01-20 NOTE — Progress Notes (Signed)
Patient came in for labs only.

## 2013-01-21 ENCOUNTER — Other Ambulatory Visit (INDEPENDENT_AMBULATORY_CARE_PROVIDER_SITE_OTHER): Payer: BC Managed Care – PPO

## 2013-01-21 ENCOUNTER — Telehealth: Payer: Self-pay | Admitting: *Deleted

## 2013-01-21 DIAGNOSIS — R7989 Other specified abnormal findings of blood chemistry: Secondary | ICD-10-CM

## 2013-01-21 LAB — BMP8+EGFR
BUN/Creatinine Ratio: 24 — ABNORMAL HIGH (ref 9–20)
GFR calc Af Amer: 46 mL/min/{1.73_m2} — ABNORMAL LOW (ref >59)
GFR calc non Af Amer: 40 mL/min/{1.73_m2} — ABNORMAL LOW (ref >59)
Potassium: 6.3 mmol/L (ref 3.5–5.2)
Sodium: 130 mmol/L — ABNORMAL LOW (ref 134–144)

## 2013-01-21 LAB — SPECIMEN STATUS REPORT

## 2013-01-21 NOTE — Telephone Encounter (Signed)
Message copied by Zannie Cove on Thu Jan 21, 2013  9:51 AM ------      Message from: Chipper Herb      Created: Thu Jan 21, 2013  8:03 AM       The blood sugar is very elevated at 469. The creatinine is 1.98 and this is consistent are slightly higher than some recent creatinine numbers. The GFR is 40. The potassium is 6.3.      These values are very out of line and the patient should be called immediately with the results and he should contact his nephrologist ------

## 2013-01-21 NOTE — Addendum Note (Signed)
Addended by: Earlene Plater on: 01/21/2013 11:33 AM   Modules accepted: Orders

## 2013-01-21 NOTE — Telephone Encounter (Signed)
Pt aware of all!!

## 2013-01-21 NOTE — Progress Notes (Signed)
Patient came in for labs only.

## 2013-01-22 ENCOUNTER — Other Ambulatory Visit: Payer: Self-pay | Admitting: Family Medicine

## 2013-01-22 ENCOUNTER — Other Ambulatory Visit: Payer: Self-pay | Admitting: Internal Medicine

## 2013-01-22 DIAGNOSIS — B20 Human immunodeficiency virus [HIV] disease: Secondary | ICD-10-CM

## 2013-01-23 LAB — BMP8+EGFR
BUN: 50 mg/dL — ABNORMAL HIGH (ref 6–24)
Calcium: 9.7 mg/dL (ref 8.7–10.2)
Creatinine, Ser: 1.9 mg/dL — ABNORMAL HIGH (ref 0.76–1.27)
GFR calc Af Amer: 48 mL/min/{1.73_m2} — ABNORMAL LOW (ref >59)
GFR calc non Af Amer: 42 mL/min/{1.73_m2} — ABNORMAL LOW (ref >59)
Glucose: 223 mg/dL — ABNORMAL HIGH (ref 65–99)
Potassium: 4.5 mmol/L (ref 3.5–5.2)

## 2013-01-23 LAB — SPECIMEN STATUS REPORT

## 2013-01-23 LAB — URIC ACID: Uric Acid: 11.9 mg/dL — ABNORMAL HIGH (ref 3.7–8.6)

## 2013-01-26 ENCOUNTER — Encounter (HOSPITAL_COMMUNITY)
Admission: RE | Admit: 2013-01-26 | Discharge: 2013-01-26 | Disposition: A | Discharge status: Home / Self Care | Payer: BC Managed Care – PPO | Source: Ambulatory Visit | Attending: Nephrology | Admitting: Nephrology

## 2013-01-26 ENCOUNTER — Other Ambulatory Visit (INDEPENDENT_AMBULATORY_CARE_PROVIDER_SITE_OTHER): Payer: BC Managed Care – PPO

## 2013-01-26 DIAGNOSIS — R7989 Other specified abnormal findings of blood chemistry: Secondary | ICD-10-CM

## 2013-01-26 DIAGNOSIS — N183 Chronic kidney disease, stage 3 unspecified: Secondary | ICD-10-CM | POA: Insufficient documentation

## 2013-01-26 DIAGNOSIS — D638 Anemia in other chronic diseases classified elsewhere: Secondary | ICD-10-CM | POA: Insufficient documentation

## 2013-01-26 LAB — POCT HEMOGLOBIN-HEMACUE: Hemoglobin: 12.6 g/dL — ABNORMAL LOW (ref 13.0–17.0)

## 2013-01-26 LAB — IRON AND TIBC
Iron: 135 ug/dL (ref 42–135)
TIBC: 242 ug/dL (ref 215–435)

## 2013-01-26 MED ORDER — EPOETIN ALFA 20000 UNIT/ML IJ SOLN: 17500.0000 [IU] | INTRAMUSCULAR | Status: DC

## 2013-01-26 NOTE — Progress Notes (Signed)
Pt came in for labs only 

## 2013-01-27 LAB — BMP8+EGFR
BUN: 35 mg/dL — ABNORMAL HIGH (ref 6–24)
CO2: 24 mmol/L (ref 18–29)
Creatinine, Ser: 1.68 mg/dL — ABNORMAL HIGH (ref 0.76–1.27)
GFR calc Af Amer: 56 mL/min/{1.73_m2} — ABNORMAL LOW (ref >59)
GFR calc non Af Amer: 49 mL/min/{1.73_m2} — ABNORMAL LOW (ref >59)
Glucose: 188 mg/dL — ABNORMAL HIGH (ref 65–99)

## 2013-01-27 LAB — AFB CULTURE WITH SMEAR (NOT AT ARMC): Acid Fast Smear: NONE SEEN

## 2013-02-04 ENCOUNTER — Other Ambulatory Visit: Payer: Self-pay | Admitting: Family Medicine

## 2013-02-07 ENCOUNTER — Other Ambulatory Visit: Payer: Self-pay | Admitting: Family Medicine

## 2013-02-08 NOTE — Telephone Encounter (Signed)
Last seen 01/04/13  Dr Ernestina Patches  If approved route to nurse to call into [facility]  [phone]

## 2013-02-09 ENCOUNTER — Encounter (HOSPITAL_COMMUNITY)
Admission: RE | Admit: 2013-02-09 | Discharge: 2013-02-09 | Disposition: A | Discharge status: Home / Self Care | Payer: BC Managed Care – PPO | Source: Ambulatory Visit | Attending: Nephrology | Admitting: Nephrology

## 2013-02-09 DIAGNOSIS — N183 Chronic kidney disease, stage 3 unspecified: Secondary | ICD-10-CM | POA: Insufficient documentation

## 2013-02-09 DIAGNOSIS — D638 Anemia in other chronic diseases classified elsewhere: Secondary | ICD-10-CM | POA: Insufficient documentation

## 2013-02-09 LAB — POCT HEMOGLOBIN-HEMACUE: Hemoglobin: 13.4 g/dL (ref 13.0–17.0)

## 2013-02-09 MED ORDER — EPOETIN ALFA 20000 UNIT/ML IJ SOLN: 17500.0000 [IU] | INTRAMUSCULAR | Status: DC

## 2013-02-12 ENCOUNTER — Other Ambulatory Visit: Payer: Self-pay | Admitting: Family Medicine

## 2013-02-15 NOTE — Telephone Encounter (Signed)
Androgel called into [facility]

## 2013-02-18 ENCOUNTER — Ambulatory Visit (INDEPENDENT_AMBULATORY_CARE_PROVIDER_SITE_OTHER): Payer: BC Managed Care – PPO | Admitting: Internal Medicine

## 2013-02-18 ENCOUNTER — Encounter: Payer: Self-pay | Admitting: Internal Medicine

## 2013-02-18 VITALS — BP 131/83 | HR 103 | Temp 97.8°F | Ht 70.0 in | Wt 222.0 lb

## 2013-02-18 DIAGNOSIS — B20 Human immunodeficiency virus [HIV] disease: Secondary | ICD-10-CM

## 2013-02-18 DIAGNOSIS — Z113 Encounter for screening for infections with a predominantly sexual mode of transmission: Secondary | ICD-10-CM

## 2013-02-18 LAB — RPR TITER

## 2013-02-18 LAB — RPR: RPR Ser Ql: REACTIVE — AB

## 2013-02-18 NOTE — Progress Notes (Signed)
  Subjective:    Patient ID: Patrick Brown, male    DOB: Nov 15, 1970, 42 y.o.   MRN: [id number]  HPI  Is in for followup of HIV.  He continues on [place] and Epzicom. He is also on dapsone for PCP prophylaxis and azithromycin for MAC prophylaxis. He feels much better and his skin seems to be clearing up some, though still has puritis.  He's gained.  His pulmonary symptoms though have cleared. He denies any missed doses of his medications.   Review of Systems  Constitutional: Positive for fatigue. Negative for fever.  HENT: Negative for sore throat.   Eyes: Negative for visual disturbance.  Respiratory: Negative for shortness of breath.   Cardiovascular: Negative for chest pain and leg swelling.  Gastrointestinal: Negative for nausea, abdominal pain and diarrhea.  Musculoskeletal: Negative for arthralgias and back pain.  Skin: Positive for rash.  Allergic/Immunologic: Positive for immunocompromised state.  Neurological: Negative for dizziness, light-headedness and headaches.  Hematological: Negative for adenopathy.  Psychiatric/Behavioral: The patient is not nervous/anxious.        Objective:   Physical Exam  Constitutional: He is oriented to person, place, and time. He appears well-developed and well-nourished. No distress.  Cardiovascular: Normal rate, regular rhythm and normal heart sounds.   No murmur heard. Pulmonary/Chest: Effort normal and breath sounds normal. No respiratory distress.  Neurological: He is alert and oriented to person, place, and time.  Skin:  Areas of small pinpoint rashes signs of scratching  Psychiatric: He has a normal mood and affect.          Assessment & Plan:

## 2013-02-18 NOTE — Assessment & Plan Note (Signed)
I will check his labs today. If his CD4 remains over 100 he can stop the azithromycin. He will return in 3 months. His GFR remains stable and no dose adjustments of his medications at this time.  Though his GFR does however around 50, I will continue to monitor it closely and change his medications if it stays persistently below 50.

## 2013-02-19 LAB — T.PALLIDUM AB, TOTAL: T pallidum Antibodies (TP-PA): >8 S/CO — ABNORMAL HIGH (ref <0.90)

## 2013-02-21 LAB — HIV-1 RNA QUANT-NO REFLEX-BLD
HIV 1 RNA Quant: <20 copies/mL (ref <20)
HIV-1 RNA Quant, Log: <1.3 {Log} (ref <1.30)

## 2013-02-22 ENCOUNTER — Other Ambulatory Visit: Payer: Self-pay | Admitting: *Deleted

## 2013-02-22 ENCOUNTER — Telehealth: Payer: Self-pay | Admitting: *Deleted

## 2013-02-22 DIAGNOSIS — R21 Rash and other nonspecific skin eruption: Secondary | ICD-10-CM

## 2013-02-22 LAB — T-HELPER CELL (CD4) - (RCID CLINIC ONLY)
CD4 T CELL HELPER: 8 % — AB (ref 33–55)
CD4 T Cell Abs: 160 /uL — ABNORMAL LOW (ref 400–2700)

## 2013-02-22 MED ORDER — HYDROXYZINE HCL 25 MG PO TABS: ORAL_TABLET | ORAL | Status: DC

## 2013-02-22 NOTE — Telephone Encounter (Signed)
Message copied by Georgena Spurling on Mon Feb 22, 2013  1:41 PM ------      Message from: Thayer Headings      Created: Mon Feb 22, 2013 10:15 AM       Please let him know that he can stop his weekly azithromycin.  Thanks ------

## 2013-02-22 NOTE — Telephone Encounter (Signed)
Patient notified Patrick Brown  

## 2013-02-23 ENCOUNTER — Encounter: Payer: Self-pay | Admitting: Family Medicine

## 2013-02-23 ENCOUNTER — Encounter (HOSPITAL_COMMUNITY)
Admission: RE | Admit: 2013-02-23 | Discharge: 2013-02-23 | Disposition: A | Discharge status: Home / Self Care | Payer: BC Managed Care – PPO | Source: Ambulatory Visit | Attending: Nephrology | Admitting: Nephrology

## 2013-02-23 ENCOUNTER — Ambulatory Visit (INDEPENDENT_AMBULATORY_CARE_PROVIDER_SITE_OTHER): Payer: BC Managed Care – PPO | Admitting: Family Medicine

## 2013-02-23 VITALS — BP 108/80 | HR 93 | Temp 97.1°F | Ht 70.0 in | Wt 216.0 lb

## 2013-02-23 DIAGNOSIS — E039 Hypothyroidism, unspecified: Secondary | ICD-10-CM

## 2013-02-23 DIAGNOSIS — N183 Chronic kidney disease, stage 3 unspecified: Secondary | ICD-10-CM

## 2013-02-23 DIAGNOSIS — B20 Human immunodeficiency virus [HIV] disease: Secondary | ICD-10-CM

## 2013-02-23 DIAGNOSIS — M109 Gout, unspecified: Secondary | ICD-10-CM

## 2013-02-23 DIAGNOSIS — J189 Pneumonia, unspecified organism: Secondary | ICD-10-CM

## 2013-02-23 DIAGNOSIS — IMO0002 Reserved for concepts with insufficient information to code with codable children: Secondary | ICD-10-CM

## 2013-02-23 DIAGNOSIS — E559 Vitamin D deficiency, unspecified: Secondary | ICD-10-CM

## 2013-02-23 DIAGNOSIS — E1065 Type 1 diabetes mellitus with hyperglycemia: Secondary | ICD-10-CM

## 2013-02-23 LAB — FERRITIN: Ferritin: 659 ng/mL — ABNORMAL HIGH (ref 22–322)

## 2013-02-23 LAB — IRON AND TIBC
Iron: 71 ug/dL (ref 42–135)
SATURATION RATIOS: 28 % (ref 20–55)
TIBC: 256 ug/dL (ref 215–435)
UIBC: 185 ug/dL (ref 125–400)

## 2013-02-23 LAB — POCT HEMOGLOBIN-HEMACUE: HEMOGLOBIN: 11.6 g/dL — AB (ref 13.0–17.0)

## 2013-02-23 MED ORDER — EPOETIN ALFA 20000 UNIT/ML IJ SOLN
INTRAMUSCULAR | Status: AC
  Filled 2013-02-23: qty 1

## 2013-02-23 MED ORDER — EPOETIN ALFA 20000 UNIT/ML IJ SOLN
17500.0000 [IU] | INTRAMUSCULAR | Status: DC
  Administered 2013-02-23: 17500 [IU] via SUBCUTANEOUS

## 2013-02-23 NOTE — Progress Notes (Signed)
Subjective:    Patient ID: Patrick Brown, male    DOB: 12/07/1970, 43 y.o.   MRN: [id number]  HPI Pt here for follow up and management of chronic medical problems. Patient comes in today with Patrick Brown mom for a followup of multiple medical problems. He is actually doing better with weight gain from getting Patrick Brown HIV under control. Patrick Brown episodes of gout had diminished and Patrick Brown leg problems have also decreased in frequency. He frequently has episodes of hyper K. bulimia and this is managed per episode. He had recent lab work done and this was reviewed from an outside source. Patrick Brown A1c was 6.6%, Patrick Brown creatinine 2.07, and Patrick Brown potassium 5.1. Patrick Brown uric acid was 7.7. Patrick Brown thyroid was within normal limits.       Patient Active Problem List   Diagnosis Date Noted  . Pain in joint, lower leg 11/19/2012  . Unspecified sinusitis (chronic) 09/16/2012  . Pneumocystis carinii pneumonia 08/19/2012  . Itch 08/17/2012  . Hypercalcemia 08/09/2012  . Right groin wound 08/09/2012  . Hyperkalemia 08/07/2012  . Acute respiratory failure with hypoxia 08/05/2012  . Hyponatremia 08/05/2012  . HCAP (healthcare-associated pneumonia) 08/05/2012  . SOB (shortness of breath) 07/14/2012  . HIV disease 06/02/2012  . Convulsions/seizures 05/16/2012  . SIRS (systemic inflammatory response syndrome) 04/18/2012  . CAP (community acquired pneumonia) 04/18/2012  . Gastroparesis 04/18/2012  . Sinus tachycardia 04/18/2012  . Near syncope 04/16/2012  . Anemia, iron deficiency   . DM (diabetes mellitus), type 1, uncontrolled 05/08/2011  . Hypothyroid 05/08/2011  . Chronic kidney disease, stage 3, mod decreased GFR 12/31/2010   Outpatient Encounter Prescriptions as of 02/23/2013  Medication Sig  . abacavir-lamiVUDine (EPZICOM) 600-300 MG per tablet Take 1 tablet by mouth at bedtime.  Marland Kitchen acetaminophen (TYLENOL) 500 MG tablet Take 1,000 mg by mouth 2 (two) times daily.   . ANDROGEL PUMP 20.25 MG/ACT (1.62%) GEL APPLY 2 PUMPS DAILY  AS DIRECTED  . aspirin 81 MG chewable tablet Chew 81 mg by mouth every morning.  . cyclobenzaprine (FLEXERIL) 10 MG tablet Take 1 tablet (10 mg total) by mouth 3 (three) times daily as needed for muscle spasms.  . dapsone 100 MG tablet TAKE 1 TABLET (100 MG TOTAL) BY MOUTH DAILY.  Marland Kitchen dolutegravir (TIVICAY) 50 MG tablet Take 1 tablet (50 mg total) by mouth at bedtime.  Marland Kitchen epoetin alfa (EPOGEN,PROCRIT) 28413 UNIT/ML injection Inject 20,000 Units into the skin every 30 (thirty) days.  . Febuxostat (ULORIC) 80 MG TABS Take 1 tablet by mouth daily.  . ferrous sulfate 325 (65 FE) MG tablet Take 650 mg by mouth daily with breakfast.   . fludrocortisone (FLORINEF) 0.1mg /mL SUSP Take 0.1 mg by mouth daily.  . furosemide (LASIX) 20 MG tablet Take 20 mg by mouth 2 (two) times daily.  . Glucosamine-Chondroit-Vit C-Mn (GLUCOSAMINE 1500 COMPLEX PO) Take 1 tablet by mouth 2 (two) times daily.   . hydrOXYzine (ATARAX/VISTARIL) 25 MG tablet TAKE 1 TABLET (25 MG TOTAL) BY MOUTH EVERY 6 (SIX) HOURS AS NEEDED FOR ITCHING.  . Insulin Human (INSULIN PUMP) 100 unit/ml SOLN Inject into the skin continuous. He uses Novolog Insulin in pump. Per sliding scale.  . levothyroxine (SYNTHROID, LEVOTHROID) 200 MCG tablet Take 200 mcg by mouth daily before breakfast.  . LORazepam (ATIVAN) 0.5 MG tablet Take 0.5 mg by mouth every 8 (eight) hours as needed for anxiety.  Marland Kitchen LYRICA 50 MG capsule TAKE ONE CAPSULE BY MOUTH 2 TIMES DAILY  . metoCLOPramide (REGLAN) 5 MG tablet  TAKE 1 TABLET BY MOUTH 3 TIMES A DAY BEFORE MEALS  . NEXIUM 40 MG capsule TAKE ONE CAPSULE BY MOUTH EVERY DAY  . NOVOLOG 100 UNIT/ML injection USE PER PUMP AS DIRECTED  . Omega-3 Fatty Acids (FISH OIL) 1000 MG CAPS Take 2 capsules by mouth daily.  . ondansetron (ZOFRAN) 4 MG tablet Take 1 tablet (4 mg total) by mouth every 8 (eight) hours as needed for nausea.  . simvastatin (ZOCOR) 40 MG tablet Take 1 tablet (40 mg total) by mouth at bedtime.  . sodium bicarbonate  650 MG tablet Take 650 mg by mouth every morning.   . traMADol (ULTRAM) 50 MG tablet Take 1 tablet (50 mg total) by mouth every 6 (six) hours as needed for pain.  Marland Kitchen venlafaxine XR (EFFEXOR-XR) 37.5 MG 24 hr capsule TAKE 3 CAPSULES BY MOUTH EVERY DAY  . [DISCONTINUED] azithromycin (ZITHROMAX) 600 MG tablet Take 1,200 mg by mouth once a week. On Fridays  . [DISCONTINUED] levothyroxine (SYNTHROID, LEVOTHROID) 175 MCG tablet Take 200 mcg by mouth every morning.  . [DISCONTINUED] omega-3 acid ethyl esters (LOVAZA) 1 G capsule Take 2 capsules (2 g total) by mouth 2 (two) times daily.    Review of Systems  Constitutional: Positive for fatigue.  HENT: Negative.   Eyes: Negative.   Respiratory: Negative.   Cardiovascular: Negative.   Gastrointestinal: Negative.   Endocrine: Negative.   Genitourinary: Negative.   Musculoskeletal: Negative.        DISCUSS MEDS_ ULORIC AND LYRICA  Skin: Negative.   Allergic/Immunologic: Negative.   Neurological: Negative.   Hematological: Negative.   Psychiatric/Behavioral: Negative.        Objective:   Physical Exam  Nursing note and vitals reviewed. Constitutional: He is oriented to person, place, and time. He appears well-developed and well-nourished. No distress.  The patient appears much more relaxed and much healthier looking than when I saw him last.  HENT:  Head: Normocephalic and atraumatic.  Right Ear: External ear normal.  Left Ear: External ear normal.  Nose: Nose normal.  Mouth/Throat: Oropharynx is clear and moist. No oropharyngeal exudate.  Eyes: Conjunctivae and EOM are normal. Pupils are equal, round, and reactive to light. Right eye exhibits no discharge. Left eye exhibits no discharge. No scleral icterus.  Neck: Normal range of motion. Neck supple. No thyromegaly present.   No carotid bruits  Cardiovascular: Normal rate, regular rhythm, normal heart sounds and intact distal pulses.  Exam reveals no gallop and no friction rub.   No  murmur heard. At 72 per minute  Pulmonary/Chest: Effort normal and breath sounds normal. No respiratory distress. He has no wheezes. He has no rales. He exhibits no tenderness.  Abdominal: Soft. Bowel sounds are normal. He exhibits no mass. There is no tenderness. There is no rebound and no guarding.  Musculoskeletal: Normal range of motion. He exhibits no edema and no tenderness.  Lymphadenopathy:    He has no cervical adenopathy.  Neurological: He is alert and oriented to person, place, and time. He has normal reflexes. No cranial nerve deficit.  Skin: Skin is warm and dry. No rash noted. No erythema. No pallor.  The skin lesions have improved tremendously and there is still scattered over Patrick Brown neck and arms but appeared to be healing  Psychiatric: He has a normal mood and affect. Patrick Brown behavior is normal. Judgment and thought content normal.   BP 108/80  Pulse 93  Temp(Src) 97.1 F (36.2 C) (Oral)  Ht 5\' 10"  (1.778 m)  Wt 216 lb (97.977 kg)  BMI 30.99 kg/m2        Assessment & Plan:  1. DM (diabetes mellitus), type 1, uncontrolled -Recent hemoglobin A1c 6.6, indicating good blood sugar control.  2. Chronic kidney disease, stage 3, mod decreased GFR -Patient to follow up with nephrologist this afternoon  3. CAP (community acquired pneumonia)-resolved  4. HIV disease -Improving  5. Hypothyroid -Recent thyroid test from outside office were good  6. Gout -Stable Patient Instructions  Continue current medications. Continue good therapeutic lifestyle changes which include good diet and exercise. Fall precautions discussed with patient. Schedule your flu vaccine if you haven't had it yet If you are over 48 years old - you may need Prevnar 64 or the adult Pneumonia vaccine. He will decrease the uloric rto 40 mg daily or 80 mg every other day He will taper off of the Lyrica over the next couple of weeks He will try to get more exercise while continuing to watch Patrick Brown diet  closely He will continue to followup with the nephrologist and Patrick Brown infectious disease doctor   Arrie Senate MD

## 2013-02-23 NOTE — Patient Instructions (Addendum)
Continue current medications. Continue good therapeutic lifestyle changes which include good diet and exercise. Fall precautions discussed with patient. Schedule your flu vaccine if you haven't had it yet If you are over 45 years old - you may need Prevnar 48 or the adult Pneumonia vaccine. He will decrease the uloric rto 40 mg daily or 80 mg every other day He will taper off of the Lyrica over the next couple of weeks He will try to get more exercise while continuing to watch his diet closely He will continue to followup with the nephrologist and his infectious disease doctor

## 2013-02-24 LAB — CBC WITH DIFFERENTIAL
Basophils Absolute: 0.1 10*3/uL (ref 0.0–0.2)
Basos: 1 %
EOS: 8 %
Eosinophils Absolute: 0.7 10*3/uL — ABNORMAL HIGH (ref 0.0–0.4)
HCT: 35.1 % — ABNORMAL LOW (ref 37.5–51.0)
HEMOGLOBIN: 11.8 g/dL — AB (ref 12.6–17.7)
IMMATURE GRANS (ABS): 0.1 10*3/uL (ref 0.0–0.1)
Immature Granulocytes: 2 %
LYMPHS: 37 %
Lymphocytes Absolute: 3.2 10*3/uL — ABNORMAL HIGH (ref 0.7–3.1)
MCH: 31.8 pg (ref 26.6–33.0)
MCHC: 33.6 g/dL (ref 31.5–35.7)
MCV: 95 fL (ref 79–97)
MONOCYTES: 14 %
Monocytes Absolute: 1.2 10*3/uL — ABNORMAL HIGH (ref 0.1–0.9)
NEUTROS PCT: 38 %
Neutrophils Absolute: 3.4 10*3/uL (ref 1.4–7.0)
Platelets: 147 10*3/uL — ABNORMAL LOW (ref 150–379)
RBC: 3.71 x10E6/uL — AB (ref 4.14–5.80)
RDW: 15.1 % (ref 12.3–15.4)
WBC: 8.7 10*3/uL (ref 3.4–10.8)

## 2013-02-24 LAB — NMR, LIPOPROFILE
CHOLESTEROL: 134 mg/dL (ref <200)
HDL Cholesterol by NMR: 60 mg/dL (ref >=40)
HDL Particle Number: 27.8 umol/L — ABNORMAL LOW (ref >=30.5)
LDL PARTICLE NUMBER: 578 nmol/L (ref <1000)
LDL Size: 20 nm — ABNORMAL LOW (ref >20.5)
LDLC SERPL CALC-MCNC: 54 mg/dL (ref <100)
LP-IR SCORE: 34 (ref <=45)
Small LDL Particle Number: 309 nmol/L (ref <=527)
Triglycerides by NMR: 102 mg/dL (ref <150)

## 2013-02-24 LAB — BMP8+EGFR
BUN/Creatinine Ratio: 14 (ref 9–20)
BUN: 31 mg/dL — AB (ref 6–24)
CHLORIDE: 96 mmol/L — AB (ref 97–108)
CO2: 23 mmol/L (ref 18–29)
Calcium: 9.6 mg/dL (ref 8.7–10.2)
Creatinine, Ser: 2.14 mg/dL — ABNORMAL HIGH (ref 0.76–1.27)
GFR calc Af Amer: 42 mL/min/{1.73_m2} — ABNORMAL LOW (ref >59)
GFR calc non Af Amer: 36 mL/min/{1.73_m2} — ABNORMAL LOW (ref >59)
GLUCOSE: 201 mg/dL — AB (ref 65–99)
POTASSIUM: 4.2 mmol/L (ref 3.5–5.2)
Sodium: 137 mmol/L (ref 134–144)

## 2013-02-24 LAB — HEPATIC FUNCTION PANEL
ALBUMIN: 4.1 g/dL (ref 3.5–5.5)
ALT: 37 IU/L (ref 0–44)
AST: 46 IU/L — AB (ref 0–40)
Alkaline Phosphatase: 88 IU/L (ref 39–117)
Bilirubin, Direct: 0.15 mg/dL (ref 0.00–0.40)
TOTAL PROTEIN: 7.2 g/dL (ref 6.0–8.5)
Total Bilirubin: 0.4 mg/dL (ref 0.0–1.2)

## 2013-02-24 LAB — VITAMIN D 25 HYDROXY (VIT D DEFICIENCY, FRACTURES): VIT D 25 HYDROXY: 51.9 ng/mL (ref 30.0–100.0)

## 2013-02-24 LAB — SPECIMEN STATUS REPORT

## 2013-03-04 ENCOUNTER — Telehealth: Payer: Self-pay | Admitting: Pulmonary Disease

## 2013-03-04 NOTE — Telephone Encounter (Signed)
Pt c/o 2 episodes of SOB yesterday, one while taking shower.  Denies cough, fever, chest tightness, wheezing.  PT concerned because this happened when he had pneumonia.  Offered ov with Dr Melvyn Novas today.  Pt requested to see Tammy tomorrow.  Appt given.

## 2013-03-05 ENCOUNTER — Ambulatory Visit (INDEPENDENT_AMBULATORY_CARE_PROVIDER_SITE_OTHER): Payer: BC Managed Care – PPO | Admitting: Adult Health

## 2013-03-05 ENCOUNTER — Encounter: Payer: Self-pay | Admitting: Adult Health

## 2013-03-05 ENCOUNTER — Ambulatory Visit (INDEPENDENT_AMBULATORY_CARE_PROVIDER_SITE_OTHER)
Admission: RE | Admit: 2013-03-05 | Discharge: 2013-03-05 | Disposition: A | Discharge status: Home / Self Care | Payer: BC Managed Care – PPO | Source: Ambulatory Visit | Attending: Adult Health | Admitting: Adult Health

## 2013-03-05 VITALS — BP 118/74 | HR 105 | Temp 97.9°F | Ht 70.0 in | Wt 216.6 lb

## 2013-03-05 DIAGNOSIS — R0602 Shortness of breath: Secondary | ICD-10-CM

## 2013-03-05 NOTE — Patient Instructions (Signed)
Saline nasal rinses As needed   Delsym 2 tsp Twice daily  As needed  Cough.  If symptoms not improving will need sooner follow up  Please contact office for sooner follow up if symptoms do not improve or worsen or seek emergency care  Follow up Dr. Gwenette Greet in 6 weeks and As needed

## 2013-03-05 NOTE — Progress Notes (Signed)
  Subjective:    Patient ID: Patrick Brown, male    DOB: [birth year], 43 y.o.   MRN: [id number]  HPI 43 yo male never smoker with known hx of Pneumocystis carinii pneumonia , hx of HIV  HX of IDDM , Renal insufficiency   03/05/2013 Acute OV  Complains of dry cough for 2 weeks, noticed tired and dyspnea 2 days ago. No wheezing, discolored mucus, body aches,fever, nausea, vomiting, diarrhea, hemoptysis, edema, head congestion, PND. Wears out easily , no energy. Gets out of breath easily.  Continues to follow with ID clinic for HIV, on EPZICOM, TIVICAY on prophylaxis  Dapsone daily  Seen lby ID 2 weeks ago and now off azithro since 1/191/5  By ID clinic. (CD4 count ~160)  Underwent FOB 12/15/12 w/ essentially unremarbkable airway and  cytology neg for malignant cells, neg AFB and fungal cx. (final).  Took Xyzal for drainage with some help.  Started on Vistaril 25mg  Four times a day  For chronic pruritis.  CXR today showed There is interval improvement in the medial left upper lobe airspace opacity which is significantly decreased, but not completelyresolved.  Walk in office with no desats - O2 sats on RA 95%.   Review of Systems  Constitutional:   No  weight loss, night sweats,  Fevers, chills,  +fatigue, or  lassitude.  HEENT:   No headaches,  Difficulty swallowing,  Tooth/dental problems, or  Sore throat,                No sneezing, itching, ear ache,  +nasal congestion, post nasal drip,   CV:  No chest pain,  Orthopnea, PND, swelling in lower extremities, anasarca, dizziness, palpitations, syncope.   GI  No heartburn, indigestion, abdominal pain, nausea, vomiting, diarrhea, change in bowel habits, loss of appetite, bloody stools.   Resp:    No chest wall deformity  Skin:+ rash -chronic   GU: no dysuria, change in color of urine, no urgency or frequency.  No flank pain, no hematuria   MS:  No joint pain or swelling.  No decreased range of motion.  No back pain.  Psych:  No change  in mood or affect. No depression or anxiety.  No memory loss.         Objective:   Physical Exam  GEN: A/Ox3; pleasant , NAD, overweight.   HEENT:  [place]/AT,  EACs-clear, TMs-wnl, NOSE-clear, THROAT-clear, no lesions, no postnasal drip or exudate noted.   NECK:  Supple w/ fair ROM; no JVD; normal carotid impulses w/o bruits; no thyromegaly or nodules palpated; no lymphadenopathy.  RESP  Clear  P & A; w/o, wheezes/ rales/ or rhonchi.no accessory muscle use, no dullness to percussion  CARD:  RRR, no m/r/g  , no peripheral edema, pulses intact, no cyanosis or clubbing.  GI:   Soft & nt; nml bowel sounds; no organomegaly or masses detected.  Musco: Warm bil, no deformities or joint swelling noted.   Neuro: alert, no focal deficits noted.    Skin: Warm, scattered open lesions noted on skin-chronic          Assessment & Plan:

## 2013-03-05 NOTE — Progress Notes (Signed)
Ov reviewed, and agree with plan as outlined.  

## 2013-03-05 NOTE — Assessment & Plan Note (Addendum)
DOE ? Etiology , exam is unrevealing  CXR w/ no acute process, improved aeration in previous LUL opacity.  No desaturations with ambulation in office on RA.  Suspect symptoms may be representative of viral process with dry cough and nasal drainage. tx symptoms conservatively.  Although pt is on several medications with multiiple side effects including fatigue.  Advised if symptoms not improving will need further evaluation with sooner follow up   Plan  Saline nasal rinses As needed   Delsym 2 tsp Twice daily  As needed  Cough.  If symptoms not improving will need sooner follow up  Please contact office for sooner follow up if symptoms do not improve or worsen or seek emergency care  Follow up Dr. Gwenette Greet in 6 weeks and As needed

## 2013-03-09 ENCOUNTER — Other Ambulatory Visit (INDEPENDENT_AMBULATORY_CARE_PROVIDER_SITE_OTHER): Payer: BC Managed Care – PPO

## 2013-03-09 ENCOUNTER — Telehealth: Payer: Self-pay | Admitting: *Deleted

## 2013-03-09 DIAGNOSIS — M109 Gout, unspecified: Secondary | ICD-10-CM

## 2013-03-09 NOTE — Telephone Encounter (Signed)
Pt is having worsening pain from gout, L great toe pain and R heel Can he increase his Uloric  He is currently taking 80mg  Please advise

## 2013-03-09 NOTE — Telephone Encounter (Signed)
80 mg is the maximum dose

## 2013-03-09 NOTE — Telephone Encounter (Signed)
Left message on pt's voice-mail.

## 2013-03-09 NOTE — Progress Notes (Signed)
Labs only

## 2013-03-10 LAB — BMP8+EGFR
BUN/Creatinine Ratio: 18 (ref 9–20)
BUN: 36 mg/dL — ABNORMAL HIGH (ref 6–24)
CO2: 18 mmol/L (ref 18–29)
CREATININE: 1.98 mg/dL — AB (ref 0.76–1.27)
Calcium: 9.8 mg/dL (ref 8.7–10.2)
Chloride: 91 mmol/L — ABNORMAL LOW (ref 97–108)
GFR calc Af Amer: 46 mL/min/{1.73_m2} — ABNORMAL LOW (ref >59)
GFR, EST NON AFRICAN AMERICAN: 40 mL/min/{1.73_m2} — AB (ref >59)
Glucose: 348 mg/dL — ABNORMAL HIGH (ref 65–99)
Potassium: 5 mmol/L (ref 3.5–5.2)
SODIUM: 131 mmol/L — AB (ref 134–144)

## 2013-03-10 LAB — URIC ACID: Uric Acid: 8.9 mg/dL — ABNORMAL HIGH (ref 3.7–8.6)

## 2013-03-12 ENCOUNTER — Telehealth: Payer: Self-pay | Admitting: *Deleted

## 2013-03-12 NOTE — Telephone Encounter (Signed)
Left message for Dr. Lorrene Reid.

## 2013-03-12 NOTE — Telephone Encounter (Signed)
Patient is no longer a patient at [facility]. He switched to [facility]. Patient called with this information at 4:49pm. I will attempt to contact [facility] but I'm not sure I'll get an answer from them today.

## 2013-03-12 NOTE — Telephone Encounter (Signed)
Please call Dr. Sanda Klein office and confirm with her the dose that she thinks would be most appropriate for patients gout flare in light of his renal insufficiency

## 2013-03-12 NOTE — Telephone Encounter (Signed)
Patient taking uloric daily and is having a gout flare up at this time is there anything he can take to help with the flare up.

## 2013-03-13 NOTE — Telephone Encounter (Signed)
Make sure that we take care of this Monday morning and speak with his new nephrologist regarding colcrys or colchicine

## 2013-03-15 ENCOUNTER — Telehealth: Payer: Self-pay | Admitting: Family Medicine

## 2013-03-15 NOTE — Telephone Encounter (Signed)
Patient is seeing Dr. Antionette Fairy in [facility] at [facility]

## 2013-03-15 NOTE — Telephone Encounter (Signed)
lmtcb - with Dr Tammi Sou MA- [facility]

## 2013-03-15 NOTE — Telephone Encounter (Signed)
Spoke with patient and i added to the previous message on what patient needed

## 2013-03-16 NOTE — Telephone Encounter (Signed)
colcrys .6, #30, 2 stat then repeat in one hour and take one daily as needed Prednisone 10 #20 taper take as directed Tell patient that this was recommended by his nephrology

## 2013-03-16 NOTE — Telephone Encounter (Signed)
Dr Tammi Sou MA called back today after speaking to him regarding his thoughts on gout. He does think we should use colcys. He recommends a medrol dose pack - followed by Uloric 40.

## 2013-03-16 NOTE — Telephone Encounter (Signed)
A mistake was made with a previous note and the nephrologist does not want him taking colcrys Only prednisone 10 paper is recommended plus his uloric

## 2013-03-17 ENCOUNTER — Ambulatory Visit: Payer: BC Managed Care – PPO

## 2013-03-17 ENCOUNTER — Other Ambulatory Visit: Payer: BC Managed Care – PPO

## 2013-03-17 MED ORDER — PREDNISONE 10 MG PO TABS: ORAL_TABLET | ORAL | Status: DC

## 2013-03-17 NOTE — Telephone Encounter (Signed)
Pt aware - pred taper given to pt and he is aware to take the uloric as directed.

## 2013-03-18 LAB — CMP14+EGFR
A/G RATIO: 1.3 (ref 1.1–2.5)
ALBUMIN: 4.3 g/dL (ref 3.5–5.5)
ALT: 28 IU/L (ref 0–44)
AST: 46 IU/L — AB (ref 0–40)
Alkaline Phosphatase: 82 IU/L (ref 39–117)
BILIRUBIN TOTAL: 0.3 mg/dL (ref 0.0–1.2)
BUN/Creatinine Ratio: 15 (ref 9–20)
BUN: 34 mg/dL — AB (ref 6–24)
CO2: 23 mmol/L (ref 18–29)
CREATININE: 2.33 mg/dL — AB (ref 0.76–1.27)
Calcium: 9.7 mg/dL (ref 8.7–10.2)
Chloride: 99 mmol/L (ref 97–108)
GFR calc non Af Amer: 33 mL/min/{1.73_m2} — ABNORMAL LOW (ref >59)
GFR, EST AFRICAN AMERICAN: 38 mL/min/{1.73_m2} — AB (ref >59)
GLOBULIN, TOTAL: 3.2 g/dL (ref 1.5–4.5)
Glucose: 117 mg/dL — ABNORMAL HIGH (ref 65–99)
Potassium: 4.7 mmol/L (ref 3.5–5.2)
Sodium: 140 mmol/L (ref 134–144)
Total Protein: 7.5 g/dL (ref 6.0–8.5)

## 2013-03-18 LAB — FECAL OCCULT BLOOD, IMMUNOCHEMICAL: Fecal Occult Bld: NEGATIVE

## 2013-03-24 ENCOUNTER — Other Ambulatory Visit: Payer: Self-pay | Admitting: *Deleted

## 2013-03-24 DIAGNOSIS — M2550 Pain in unspecified joint: Secondary | ICD-10-CM

## 2013-03-25 ENCOUNTER — Encounter: Payer: Self-pay | Admitting: *Deleted

## 2013-03-25 NOTE — Progress Notes (Signed)
Quick Note:  Copy of labs sent to patient ______ 

## 2013-03-31 ENCOUNTER — Other Ambulatory Visit (INDEPENDENT_AMBULATORY_CARE_PROVIDER_SITE_OTHER): Payer: BC Managed Care – PPO

## 2013-03-31 DIAGNOSIS — E875 Hyperkalemia: Secondary | ICD-10-CM

## 2013-03-31 DIAGNOSIS — M109 Gout, unspecified: Secondary | ICD-10-CM

## 2013-03-31 NOTE — Progress Notes (Signed)
Pt came in for labs only for dr. Antionette Fairy

## 2013-04-01 LAB — BMP8+EGFR
BUN / CREAT RATIO: 18 (ref 9–20)
BUN: 32 mg/dL — ABNORMAL HIGH (ref 6–24)
CO2: 26 mmol/L (ref 18–29)
Calcium: 10.2 mg/dL (ref 8.7–10.2)
Chloride: 96 mmol/L — ABNORMAL LOW (ref 97–108)
Creatinine, Ser: 1.81 mg/dL — ABNORMAL HIGH (ref 0.76–1.27)
GFR, EST AFRICAN AMERICAN: 51 mL/min/{1.73_m2} — AB (ref >59)
GFR, EST NON AFRICAN AMERICAN: 44 mL/min/{1.73_m2} — AB (ref >59)
Glucose: 129 mg/dL — ABNORMAL HIGH (ref 65–99)
POTASSIUM: 4.3 mmol/L (ref 3.5–5.2)
SODIUM: 137 mmol/L (ref 134–144)

## 2013-04-01 LAB — URIC ACID: Uric Acid: 7.5 mg/dL (ref 3.7–8.6)

## 2013-04-03 ENCOUNTER — Other Ambulatory Visit: Payer: Self-pay | Admitting: Family Medicine

## 2013-04-08 ENCOUNTER — Encounter (HOSPITAL_COMMUNITY)
Admission: RE | Admit: 2013-04-08 | Discharge: 2013-04-08 | Disposition: A | Discharge status: Home / Self Care | Payer: BC Managed Care – PPO | Source: Ambulatory Visit | Attending: Nephrology | Admitting: Nephrology

## 2013-04-08 DIAGNOSIS — D638 Anemia in other chronic diseases classified elsewhere: Secondary | ICD-10-CM | POA: Insufficient documentation

## 2013-04-08 DIAGNOSIS — N183 Chronic kidney disease, stage 3 unspecified: Secondary | ICD-10-CM | POA: Insufficient documentation

## 2013-04-08 LAB — POCT HEMOGLOBIN-HEMACUE: HEMOGLOBIN: 11.1 g/dL — AB (ref 13.0–17.0)

## 2013-04-08 MED ORDER — EPOETIN ALFA 20000 UNIT/ML IJ SOLN: 17500.0000 [IU] | INTRAMUSCULAR | Status: DC

## 2013-04-16 ENCOUNTER — Ambulatory Visit (INDEPENDENT_AMBULATORY_CARE_PROVIDER_SITE_OTHER)
Admission: RE | Admit: 2013-04-16 | Discharge: 2013-04-16 | Disposition: A | Discharge status: Home / Self Care | Payer: BC Managed Care – PPO | Source: Ambulatory Visit | Attending: Pulmonary Disease | Admitting: Pulmonary Disease

## 2013-04-16 ENCOUNTER — Ambulatory Visit (INDEPENDENT_AMBULATORY_CARE_PROVIDER_SITE_OTHER): Payer: BC Managed Care – PPO | Admitting: Pulmonary Disease

## 2013-04-16 ENCOUNTER — Encounter: Payer: Self-pay | Admitting: Pulmonary Disease

## 2013-04-16 VITALS — BP 128/86 | HR 95 | Temp 97.8°F | Ht 70.0 in | Wt 213.8 lb

## 2013-04-16 DIAGNOSIS — R918 Other nonspecific abnormal finding of lung field: Secondary | ICD-10-CM

## 2013-04-16 NOTE — Progress Notes (Signed)
   Subjective:    Patient ID: Patrick Brown, male    DOB: 23-Mar-1970, 43 y.o.   MRN: [id number]  HPI Patient comes in today for followup of his known left upper lobe infiltrate in the context of HIV positivity. He is been treated for community-acquired pneumonia, and has had a gradual improvement over time.  He currently denies any shortness of breath, and he has minimal dry cough at this time. He feels that he is returned to his usual baseline before getting sick. His last x-ray in January showed considerable improvement but not total resolution of his left upper lobe process. He is continuing to followup in infectious disease clinic.     Review of Systems  Constitutional: Negative for fever and unexpected weight change.  HENT: Positive for congestion. Negative for dental problem, ear pain, nosebleeds, postnasal drip, rhinorrhea, sinus pressure, sneezing, sore throat and trouble swallowing.   Eyes: Negative for redness and itching.  Respiratory: Positive for cough. Negative for chest tightness, shortness of breath and wheezing.   Cardiovascular: Negative for palpitations and leg swelling.  Gastrointestinal: Negative for nausea and vomiting.  Genitourinary: Negative for dysuria.  Musculoskeletal: Negative for joint swelling.  Skin: Negative for rash.  Neurological: Negative for headaches.  Hematological: Does not bruise/bleed easily.  Psychiatric/Behavioral: Negative for dysphoric mood. The patient is not nervous/anxious.        Objective:   Physical Exam Overweight male in no acute distress Nose without purulence or discharge noted Neck without lymphadenopathy or thyromegaly Chest completely clear to auscultation, no crackles or wheezes Cardiac exam with regular rate and rhythm Lower extremities without edema, no cyanosis Alert and oriented, moves all 4 extremities.       Assessment & Plan:

## 2013-04-16 NOTE — Patient Instructions (Signed)
Will check chest xray today, and will call you with results. Work on modest weight loss, and also some type of exercise program in order to improve your endurance.  Will decide about followup with me after looking at xray.

## 2013-04-16 NOTE — Addendum Note (Signed)
Addended by: Len Blalock on: 04/16/2013 02:59 PM   Modules accepted: Orders

## 2013-04-23 ENCOUNTER — Other Ambulatory Visit: Payer: Self-pay | Admitting: Licensed Clinical Social Worker

## 2013-04-23 DIAGNOSIS — B20 Human immunodeficiency virus [HIV] disease: Secondary | ICD-10-CM

## 2013-04-23 MED ORDER — ABACAVIR SULFATE-LAMIVUDINE 600-300 MG PO TABS: 1.0000 | ORAL_TABLET | Freq: Every day | ORAL | Status: DC

## 2013-04-26 ENCOUNTER — Other Ambulatory Visit: Payer: Self-pay | Admitting: *Deleted

## 2013-04-26 DIAGNOSIS — B20 Human immunodeficiency virus [HIV] disease: Secondary | ICD-10-CM

## 2013-04-26 MED ORDER — DOLUTEGRAVIR SODIUM 50 MG PO TABS: 50.0000 mg | ORAL_TABLET | Freq: Every day | ORAL | Status: DC

## 2013-05-02 ENCOUNTER — Other Ambulatory Visit: Payer: Self-pay | Admitting: Family Medicine

## 2013-05-04 ENCOUNTER — Other Ambulatory Visit (INDEPENDENT_AMBULATORY_CARE_PROVIDER_SITE_OTHER): Payer: BC Managed Care – PPO

## 2013-05-04 DIAGNOSIS — E875 Hyperkalemia: Secondary | ICD-10-CM

## 2013-05-04 NOTE — Progress Notes (Signed)
Pt came in for labs only 

## 2013-05-05 ENCOUNTER — Other Ambulatory Visit: Payer: Self-pay | Admitting: Family Medicine

## 2013-05-05 LAB — BMP8+EGFR
BUN/Creatinine Ratio: 12 (ref 9–20)
BUN: 28 mg/dL — ABNORMAL HIGH (ref 6–24)
CHLORIDE: 96 mmol/L — AB (ref 97–108)
CO2: 26 mmol/L (ref 18–29)
Calcium: 9.8 mg/dL (ref 8.7–10.2)
Creatinine, Ser: 2.25 mg/dL — ABNORMAL HIGH (ref 0.76–1.27)
GFR calc non Af Amer: 34 mL/min/{1.73_m2} — ABNORMAL LOW (ref >59)
GFR, EST AFRICAN AMERICAN: 40 mL/min/{1.73_m2} — AB (ref >59)
Glucose: 218 mg/dL — ABNORMAL HIGH (ref 65–99)
POTASSIUM: 4.5 mmol/L (ref 3.5–5.2)
SODIUM: 137 mmol/L (ref 134–144)

## 2013-05-06 ENCOUNTER — Encounter (HOSPITAL_COMMUNITY)
Admission: RE | Admit: 2013-05-06 | Discharge: 2013-05-06 | Disposition: A | Discharge status: Home / Self Care | Payer: BC Managed Care – PPO | Source: Ambulatory Visit | Attending: Nephrology | Admitting: Nephrology

## 2013-05-06 DIAGNOSIS — D638 Anemia in other chronic diseases classified elsewhere: Secondary | ICD-10-CM | POA: Insufficient documentation

## 2013-05-06 DIAGNOSIS — N183 Chronic kidney disease, stage 3 unspecified: Secondary | ICD-10-CM | POA: Insufficient documentation

## 2013-05-06 LAB — POCT HEMOGLOBIN-HEMACUE: HEMOGLOBIN: 12.4 g/dL — AB (ref 13.0–17.0)

## 2013-05-06 MED ORDER — EPOETIN ALFA 20000 UNIT/ML IJ SOLN: 17500.0000 [IU] | INTRAMUSCULAR | Status: DC

## 2013-05-12 ENCOUNTER — Encounter: Payer: Self-pay | Admitting: General Practice

## 2013-05-12 ENCOUNTER — Ambulatory Visit (INDEPENDENT_AMBULATORY_CARE_PROVIDER_SITE_OTHER): Payer: BC Managed Care – PPO | Admitting: General Practice

## 2013-05-12 ENCOUNTER — Telehealth: Payer: Self-pay | Admitting: Nurse Practitioner

## 2013-05-12 VITALS — BP 125/81 | HR 105 | Temp 98.8°F | Ht 70.0 in | Wt 207.0 lb

## 2013-05-12 DIAGNOSIS — J019 Acute sinusitis, unspecified: Secondary | ICD-10-CM

## 2013-05-12 MED ORDER — AMOXICILLIN-POT CLAVULANATE 875-125 MG PO TABS: 1.0000 | ORAL_TABLET | Freq: Two times a day (BID) | ORAL | Status: DC

## 2013-05-12 NOTE — Telephone Encounter (Signed)
appt at 5 with mae

## 2013-05-12 NOTE — Progress Notes (Signed)
   Subjective:    Patient ID: Patrick Brown, male    DOB: 1970/09/11, 43 y.o.   MRN: [id number]  Sinusitis This is a new problem. The current episode started in the past 7 days. The problem has been gradually worsening since onset. There has been no fever. Associated symptoms include congestion and sinus pressure. Pertinent negatives include no chills, headaches or shortness of breath. Past treatments include nothing.       Review of Systems  Constitutional: Negative for fever and chills.  HENT: Positive for congestion and sinus pressure.   Respiratory: Negative for chest tightness and shortness of breath.   Cardiovascular: Negative for chest pain and palpitations.  Neurological: Negative for dizziness, weakness and headaches.       Objective:   Physical Exam  Constitutional: He appears well-developed and well-nourished.  HENT:  Head: Normocephalic and atraumatic.  Right Ear: External ear normal.  Left Ear: External ear normal.  Nose: Right sinus exhibits maxillary sinus tenderness and frontal sinus tenderness. Left sinus exhibits maxillary sinus tenderness and frontal sinus tenderness.  Cardiovascular: Normal rate, regular rhythm and normal heart sounds.   Pulmonary/Chest: Effort normal and breath sounds normal. No respiratory distress. He exhibits no tenderness.          Assessment & Plan:  1. Sinusitis, acute - amoxicillin-clavulanate (AUGMENTIN) 875-125 MG per tablet; Take 1 tablet by mouth 2 (two) times daily.  Dispense: 20 tablet; Refill: 0 -RTO prn Patient verbalized understanding Erby Pian, FNP-C

## 2013-05-12 NOTE — Patient Instructions (Signed)

## 2013-05-13 ENCOUNTER — Other Ambulatory Visit: Payer: Self-pay

## 2013-05-19 ENCOUNTER — Ambulatory Visit (INDEPENDENT_AMBULATORY_CARE_PROVIDER_SITE_OTHER): Payer: BC Managed Care – PPO | Admitting: Internal Medicine

## 2013-05-19 ENCOUNTER — Encounter: Payer: Self-pay | Admitting: Internal Medicine

## 2013-05-19 VITALS — BP 111/72 | HR 90 | Temp 97.8°F | Ht 70.0 in | Wt 208.5 lb

## 2013-05-19 DIAGNOSIS — B20 Human immunodeficiency virus [HIV] disease: Secondary | ICD-10-CM

## 2013-05-19 DIAGNOSIS — R918 Other nonspecific abnormal finding of lung field: Secondary | ICD-10-CM

## 2013-05-19 NOTE — Assessment & Plan Note (Signed)
Stable, asymptomatic.  I do not feel a biopsy is indicated at this time.

## 2013-05-19 NOTE — Progress Notes (Signed)
Patient ID: JOSEPHMICHAEL Brown, male   DOB: 06-11-1970, 43 y.o.   MRN: [id number] HPI: Patrick Brown is a 43 y.o. male who is here for his visit.   Allergies: Allergies  Allergen Reactions  . Sulfa Antibiotics Other (See Comments)    High potassium  . Ramipril Cough  . Versed [Midazolam] Other (See Comments)    "I don't wake up very good or clear it out of my system"    Vitals: Temp: 97.8 F (36.6 C) (04/15 1352) Temp src: Oral (04/15 1352) BP: 111/72 mmHg (04/15 1352) Pulse Rate: 90 (04/15 1352)  Past Medical History: Past Medical History  Diagnosis Date  . IDDM (insulin dependent diabetes mellitus)     38 years  . Retinopathy     x2  . Proteinuria   . Dyslipidemia   . Anemia, iron deficiency On procrit  . Hematuria, microscopic 10/09    work up negative (Dr. Amalia Hailey)  . Hypothyroidism   . CKD (chronic kidney disease)   . Low HDL (under 40)   . 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 Hyperkalemia   . Depression   . Gastroparesis diabeticorum   . Gastroesophageal reflux disease   . Degenerative arthritis   . HIV positive   . CKD (chronic kidney disease) stage 3, GFR 30-59 ml/min   . SIRS (systemic inflammatory response syndrome)   . CAP (community acquired pneumonia)     Social History: History   Social History  . Marital Status: Single    Spouse Name: N/A    Number of Children: 0  . Years of Education: AAS   Occupational History  . EMT     [place] History Main Topics  . Smoking status: Never Smoker   . Smokeless tobacco: Never Used  . Alcohol Use: No     Comment: Infrequent, less than 1 x per month.  . Drug Use: No  . Sexual Activity: Not Currently     Comment: declined condoms   Other Topics Concern  . None    Social History Narrative   Single.  Lives at home with mom.  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     Previous Regimen:   Current Regimen: DTG+EPZ  Labs: HIV 1 RNA Quant (copies/mL)  Date Value  02/18/2013 <20   11/19/2012 <20   09/15/2012 220*     CD4 T Cell Abs (/uL)  Date Value  02/18/2013 160*  11/19/2012 120*  09/15/2012 50*     Hep B S Ab (no units)  Date Value  06/18/2012 NONREACTIVE      Hepatitis B Surface Ag (no units)  Date Value  06/18/2012 NEGATIVE      HCV Ab (no units)  Date Value  06/18/2012 NEGATIVE     CrCl: Estimated Creatinine Clearance: 48.4 ml/min (by C-G formula based on Cr of 2.25).  Lipids:    Component Value Date/Time   CHOL 134 02/23/2013 1109   TRIG 182* 06/09/2012 0940   LDLCALC 55 06/09/2012 0940    Assessment: He is doing well on his current regimen. His scr is around 2 and CrCl is >50. Will cont him on this regimen since no dose adjustment is necessary at this point. He did state that he missed on dose in the past month bc he was  out of meds at that time. I told him to refill his meds about a week in advance. He understood it.   Recommendations: Cont Tivicay 50mg  PO qday Cont Epzicom 1 tab PO qday  [place], PharmD Clinical Infectious Disease [facility] 05/19/2013, 2:18 PM

## 2013-05-19 NOTE — Assessment & Plan Note (Signed)
Doing well.  Labs today and hopefully  CD4 over 200.  RTC 3 months.

## 2013-05-19 NOTE — Progress Notes (Signed)
  Subjective:    Patient ID: Patrick Brown, male    DOB: 07-22-1970, 43 y.o.   MRN: [id number]  HPI  Is in for followup of HIV.  He continues on [place] and Epzicom. He is also on dapsone for PCP prophylaxis and azithromycin for MAC prophylaxis. He feels much better and his skin seems to be clearing up some, though still has puritis.  He's gained weight again.  His pulmonary symptoms though have cleared. He denies any missed doses of his medications.  He has seen Dr. Gwenette Greet and there is a persistent lesion on CXR.     Review of Systems  Constitutional: Positive for fatigue. Negative for fever.  HENT: Negative for sore throat.   Eyes: Negative for visual disturbance.  Respiratory: Negative for shortness of breath.   Gastrointestinal: Negative for nausea, abdominal pain and diarrhea.  Musculoskeletal: Negative for arthralgias and back pain.  Skin: Positive for rash.       Improved rash  Neurological: Negative for dizziness, light-headedness and headaches.  Hematological: Negative for adenopathy.       Objective:   Physical Exam  Constitutional: He is oriented to person, place, and time. He appears well-developed and well-nourished. No distress.  Cardiovascular: Normal rate, regular rhythm and normal heart sounds.   No murmur heard. Pulmonary/Chest: Effort normal and breath sounds normal. No respiratory distress.  Neurological: He is alert and oriented to person, place, and time.  Skin:  Areas of small pinpoint rashes signs of scratching, improved  Psychiatric: He has a normal mood and affect.          Assessment & Plan:

## 2013-05-20 LAB — HIV-1 RNA QUANT-NO REFLEX-BLD
HIV 1 RNA Quant: 32 copies/mL — ABNORMAL HIGH (ref <20)
HIV-1 RNA Quant, Log: 1.51 {Log} — ABNORMAL HIGH (ref <1.30)

## 2013-05-20 LAB — T-HELPER CELL (CD4) - (RCID CLINIC ONLY)
CD4 % Helper T Cell: 7 % — ABNORMAL LOW (ref 33–55)
CD4 T Cell Abs: 300 /uL — ABNORMAL LOW (ref 400–2700)

## 2013-05-24 ENCOUNTER — Other Ambulatory Visit: Payer: Self-pay | Admitting: Family Medicine

## 2013-05-26 NOTE — Telephone Encounter (Signed)
This is okay to refill 

## 2013-05-26 NOTE — Telephone Encounter (Signed)
Last ov 05/12/13. Last refill 08/26/12. Directions in epic are take tid but refill request was for QD. Please review allergies. Thanks.

## 2013-05-26 NOTE — Telephone Encounter (Signed)
Route to [facility] for nurse to call in [place]

## 2013-05-26 NOTE — Telephone Encounter (Signed)
Left refill authorization on voicemail

## 2013-05-31 ENCOUNTER — Other Ambulatory Visit: Payer: Self-pay | Admitting: Family Medicine

## 2013-06-08 ENCOUNTER — Encounter: Payer: Self-pay | Admitting: Family Medicine

## 2013-06-08 ENCOUNTER — Ambulatory Visit (INDEPENDENT_AMBULATORY_CARE_PROVIDER_SITE_OTHER): Payer: BC Managed Care – PPO | Admitting: Family Medicine

## 2013-06-08 VITALS — BP 137/83 | HR 101 | Temp 97.6°F | Ht 70.0 in | Wt 213.0 lb

## 2013-06-08 DIAGNOSIS — D509 Iron deficiency anemia, unspecified: Secondary | ICD-10-CM

## 2013-06-08 DIAGNOSIS — J329 Chronic sinusitis, unspecified: Secondary | ICD-10-CM

## 2013-06-08 DIAGNOSIS — N183 Chronic kidney disease, stage 3 unspecified: Secondary | ICD-10-CM

## 2013-06-08 DIAGNOSIS — J019 Acute sinusitis, unspecified: Secondary | ICD-10-CM

## 2013-06-08 DIAGNOSIS — E785 Hyperlipidemia, unspecified: Secondary | ICD-10-CM

## 2013-06-08 DIAGNOSIS — J310 Chronic rhinitis: Secondary | ICD-10-CM

## 2013-06-08 DIAGNOSIS — IMO0002 Reserved for concepts with insufficient information to code with codable children: Secondary | ICD-10-CM

## 2013-06-08 DIAGNOSIS — E039 Hypothyroidism, unspecified: Secondary | ICD-10-CM

## 2013-06-08 DIAGNOSIS — E1065 Type 1 diabetes mellitus with hyperglycemia: Secondary | ICD-10-CM

## 2013-06-08 LAB — POCT GLYCOSYLATED HEMOGLOBIN (HGB A1C): Hemoglobin A1C: 5.5

## 2013-06-08 LAB — POCT UA - MICROALBUMIN: Microalbumin Ur, POC: 50 mg/L

## 2013-06-08 MED ORDER — AMOXICILLIN-POT CLAVULANATE 875-125 MG PO TABS: 1.0000 | ORAL_TABLET | Freq: Two times a day (BID) | ORAL | Status: DC

## 2013-06-08 NOTE — Progress Notes (Signed)
Subjective:    Patient ID: Patrick Brown, male    DOB: 1970/10/13, 43 y.o.   MRN: [id number]  HPI Pt here for follow up and management of chronic medical problems.          Patient Active Problem List   Diagnosis Date Noted  . Pain in joint, lower leg 11/19/2012  . Unspecified sinusitis (chronic) 09/16/2012  . Pneumocystis carinii pneumonia 08/19/2012  . Itch 08/17/2012  . Hypercalcemia 08/09/2012  . Right groin wound 08/09/2012  . Hyperkalemia 08/07/2012  . Acute respiratory failure with hypoxia 08/05/2012  . Hyponatremia 08/05/2012  . HCAP (healthcare-associated pneumonia) 08/05/2012  . SOB (shortness of breath) 07/14/2012  . HIV disease 06/02/2012  . Convulsions/seizures 05/16/2012  . SIRS (systemic inflammatory response syndrome) 04/18/2012  . Pulmonary infiltrate 04/18/2012  . Gastroparesis 04/18/2012  . Sinus tachycardia 04/18/2012  . Near syncope 04/16/2012  . Anemia, iron deficiency   . DM (diabetes mellitus), type 1, uncontrolled 05/08/2011  . Hypothyroid 05/08/2011  . Chronic kidney disease, stage 3, mod decreased GFR 12/31/2010   Outpatient Encounter Prescriptions as of 06/08/2013  Medication Sig  . abacavir-lamiVUDine (EPZICOM) 600-300 MG per tablet Take 1 tablet by mouth at bedtime.  Marland Kitchen acetaminophen (TYLENOL) 500 MG tablet Take 1,000 mg by mouth 2 (two) times daily.   . ANDROGEL PUMP 20.25 MG/ACT (1.62%) GEL APPLY 2 PUMPS DAILY AS DIRECTED  . aspirin 81 MG chewable tablet Chew 81 mg by mouth every morning.  . cyclobenzaprine (FLEXERIL) 10 MG tablet Take 1 tablet (10 mg total) by mouth 3 (three) times daily as needed for muscle spasms.  . dapsone 100 MG tablet TAKE 1 TABLET (100 MG TOTAL) BY MOUTH DAILY.  Marland Kitchen dolutegravir (TIVICAY) 50 MG tablet Take 1 tablet (50 mg total) by mouth at bedtime.  Marland Kitchen epoetin alfa (EPOGEN,PROCRIT) 24235 UNIT/ML injection Inject 17,500 Units into the skin every 30 (thirty) days.   . ferrous sulfate 325 (65 FE) MG tablet Take 650  mg by mouth daily with breakfast.   . fludrocortisone (FLORINEF) 0.1mg /mL SUSP Take 0.2 mg by mouth daily.   . furosemide (LASIX) 20 MG tablet Take 20 mg by mouth 2 (two) times daily.  . Glucosamine-Chondroit-Vit C-Mn (GLUCOSAMINE 1500 COMPLEX PO) Take 1 tablet by mouth 2 (two) times daily.   . hydrOXYzine (ATARAX/VISTARIL) 25 MG tablet TAKE 1 TABLET (25 MG TOTAL) BY MOUTH EVERY 6 (SIX) HOURS AS NEEDED FOR ITCHING.  . Insulin Human (INSULIN PUMP) 100 unit/ml SOLN Inject into the skin continuous. He uses Novolog Insulin in pump. Per sliding scale.  . Levocetirizine Dihydrochloride (XYZAL PO) Take by mouth. Takes 1/2 cap  . levothyroxine (SYNTHROID, LEVOTHROID) 200 MCG tablet Take 200 mcg by mouth daily before breakfast.  . loratadine (CLARITIN) 10 MG tablet Take 10 mg by mouth daily as needed for allergies.  Marland Kitchen LORazepam (ATIVAN) 0.5 MG tablet TAKE 1 TABLET BY MOUTH EVERY DAY AS NEEDED  . metoCLOPramide (REGLAN) 5 MG tablet TAKE 1 TABLET BY MOUTH 3 TIMES A DAY BEFORE MEALS  . NEXIUM 40 MG capsule TAKE ONE CAPSULE BY MOUTH EVERY DAY  . NIASPAN 500 MG CR tablet Take 1,500 mg by mouth at bedtime.  Marland Kitchen NOVOLOG 100 UNIT/ML injection USE PER PUMP AS DIRECTED  . Omega-3 Fatty Acids (FISH OIL) 1000 MG CAPS Take 2 capsules by mouth daily.  . ondansetron (ZOFRAN) 4 MG tablet Take 1 tablet (4 mg total) by mouth every 8 (eight) hours as needed for nausea.  . simvastatin (ZOCOR)  40 MG tablet Take 1 tablet (40 mg total) by mouth at bedtime.  . sodium bicarbonate 650 MG tablet Take 650 mg by mouth every morning.   . traMADol (ULTRAM) 50 MG tablet Take 1 tablet (50 mg total) by mouth every 6 (six) hours as needed for pain.  Marland Kitchen venlafaxine XR (EFFEXOR-XR) 37.5 MG 24 hr capsule TAKE 3 CAPSULES BY MOUTH EVERY DAY  . Zolpidem Tartrate (AMBIEN PO) Take by mouth at bedtime as needed. Unsure of dose. Pt is only taking 1/4 of tab  . [DISCONTINUED] venlafaxine XR (EFFEXOR-XR) 37.5 MG 24 hr capsule TAKE 3 CAPSULES BY MOUTH  EVERY DAY  . [DISCONTINUED] amoxicillin-clavulanate (AUGMENTIN) 875-125 MG per tablet Take 1 tablet by mouth 2 (two) times daily.  . [DISCONTINUED] Febuxostat (ULORIC) 80 MG TABS Take 1 tablet by mouth daily.    Review of Systems  Constitutional: Negative.   HENT: Positive for congestion (and allergy) and sinus pressure.   Eyes: Negative.   Respiratory: Negative.   Cardiovascular: Negative.   Gastrointestinal: Negative.   Endocrine: Negative.   Genitourinary: Negative.   Musculoskeletal: Negative.   Skin: Negative.   Allergic/Immunologic: Negative.   Neurological: Negative.   Hematological: Negative.   Psychiatric/Behavioral: Positive for sleep disturbance ("groggy" in the am after sleep med).       Objective:   Physical Exam  Nursing note and vitals reviewed. Constitutional: He is oriented to person, place, and time. He appears well-developed and well-nourished. No distress.  The patient comes to the office to visit with his mother. He is doing much better. He has been seeing the infectious disease doctor and his CD4 counts are improving. He seems to have a positive attitude.  HENT:  Head: Normocephalic and atraumatic.  Right Ear: External ear normal.  Left Ear: External ear normal.  Mouth/Throat: Oropharynx is clear and moist. No oropharyngeal exudate.  Nasal congestion  Eyes: Conjunctivae and EOM are normal. Pupils are equal, round, and reactive to light. Right eye exhibits no discharge. Left eye exhibits no discharge. No scleral icterus.  Neck: Normal range of motion. Neck supple. No thyromegaly present.  Cardiovascular: Normal rate, regular rhythm, normal heart sounds and intact distal pulses.  Exam reveals no gallop and no friction rub.   No murmur heard. Pulmonary/Chest: Effort normal and breath sounds normal. No respiratory distress. He has no wheezes. He has no rales. He exhibits no tenderness.  The chest is clear anteriorly and posteriorly and there are no axillary  nodes.  Abdominal: Soft. Bowel sounds are normal. He exhibits no mass. There is no tenderness. There is no rebound and no guarding.  The abdomen is nontender and there are no inguinal nodes.  Genitourinary: Rectum normal, prostate normal and penis normal.  Musculoskeletal: Normal range of motion. He exhibits no edema and no tenderness.  Lymphadenopathy:    He has no cervical adenopathy.  Neurological: He is alert and oriented to person, place, and time. He has normal reflexes. No cranial nerve deficit.  Skin: Skin is warm and dry. Rash noted. No erythema. No pallor.  All of the excoriations on his body appeared to be healing and there are fewer in number than there have been in the past  Psychiatric: He has a normal mood and affect. His behavior is normal. Judgment and thought content normal.   BP 137/83  Pulse 101  Temp(Src) 97.6 F (36.4 C) (Oral)  Ht $R'5\' 10"'My$  (1.778 m)  Wt 213 lb (96.616 kg)  BMI 30.56 kg/m2  Assessment & Plan:  1. Hypothyroid - Thyroid Panel With TSH  2. DM (diabetes mellitus), type 1, uncontrolled - POCT glycosylated hemoglobin (Hb A1C) - POCT UA - Microalbumin - BMP8+EGFR - Microalbumin, urine  3. Anemia, iron deficiency - Anemia Profile B  4. Chronic kidney disease, stage 3, mod decreased GFR - BMP8+EGFR  5. Hyperlipidemia - BMP8+EGFR - Hepatic function panel - Lipid panel  6. Rhinosinusitis  7. Sinusitis, acute - amoxicillin-clavulanate (AUGMENTIN) 875-125 MG per tablet; Take 1 tablet by mouth 2 (two) times daily.  Dispense: 20 tablet; Refill: 0  Patient Instructions  Continue current medications. Continue good therapeutic lifestyle changes which include good diet and exercise. Fall precautions discussed with patient. If an FOBT was given today- please return it to our front desk. If you are over 39 years old - you may need Prevnar 61 or the adult Pneumonia vaccine.  Continue seeing the nephrologist and the rheumatologist as  planned, also continuevisits with the infectious disease person that you're seeing Use nasal steroid regularly Please be careful with the quantity of indomethacin that you were taking and take as little as possible Reduce the Ativan to one half by mouth at night as needed for sleep Discontinue the Ambien Remember that Flexeril hydroxyzine and Ativan can all make you more groggy and less alert Take the antibiotic as directed Continue to monitor blood sugars closely at home We will call you with her lab work once those results are available    Arrie Senate MD

## 2013-06-08 NOTE — Patient Instructions (Addendum)
Continue current medications. Continue good therapeutic lifestyle changes which include good diet and exercise. Fall precautions discussed with patient. If an FOBT was given today- please return it to our front desk. If you are over 45 years old - you may need Prevnar 72 or the adult Pneumonia vaccine.  Continue seeing the nephrologist and the rheumatologist as planned, also continuevisits with the infectious disease person that you're seeing Use nasal steroid regularly Please be careful with the quantity of indomethacin that you were taking and take as little as possible Reduce the Ativan to one half by mouth at night as needed for sleep Discontinue the Ambien Remember that Flexeril hydroxyzine and Ativan can all make you more groggy and less alert Take the antibiotic as directed Continue to monitor blood sugars closely at home We will call you with her lab work once those results are available

## 2013-06-09 LAB — BMP8+EGFR
BUN/Creatinine Ratio: 16 (ref 9–20)
BUN: 34 mg/dL — ABNORMAL HIGH (ref 6–24)
CALCIUM: 9.4 mg/dL (ref 8.7–10.2)
CO2: 22 mmol/L (ref 18–29)
CREATININE: 2.14 mg/dL — AB (ref 0.76–1.27)
Chloride: 96 mmol/L — ABNORMAL LOW (ref 97–108)
GFR, EST AFRICAN AMERICAN: 42 mL/min/{1.73_m2} — AB (ref >59)
GFR, EST NON AFRICAN AMERICAN: 36 mL/min/{1.73_m2} — AB (ref >59)
GLUCOSE: 362 mg/dL — AB (ref 65–99)
Potassium: 5.4 mmol/L — ABNORMAL HIGH (ref 3.5–5.2)
Sodium: 133 mmol/L — ABNORMAL LOW (ref 134–144)

## 2013-06-09 LAB — ANEMIA PROFILE B
BASOS ABS: 0.1 10*3/uL (ref 0.0–0.2)
Basos: 1 %
EOS ABS: 0.2 10*3/uL (ref 0.0–0.4)
Eos: 4 %
FERRITIN: 770 ng/mL — AB (ref 30–400)
HEMATOCRIT: 32.9 % — AB (ref 37.5–51.0)
Hemoglobin: 10.7 g/dL — ABNORMAL LOW (ref 12.6–17.7)
IRON SATURATION: 31 % (ref 15–55)
IRON: 63 ug/dL (ref 40–155)
Immature Grans (Abs): 0 10*3/uL (ref 0.0–0.1)
Immature Granulocytes: 0 %
LYMPHS: 37 %
Lymphocytes Absolute: 2.5 10*3/uL (ref 0.7–3.1)
MCH: 32.7 pg (ref 26.6–33.0)
MCHC: 32.5 g/dL (ref 31.5–35.7)
MCV: 101 fL — ABNORMAL HIGH (ref 79–97)
Monocytes Absolute: 0.7 10*3/uL (ref 0.1–0.9)
Monocytes: 11 %
Neutrophils Absolute: 3.1 10*3/uL (ref 1.4–7.0)
Neutrophils Relative %: 47 %
PLATELETS: 139 10*3/uL — AB (ref 150–379)
RBC: 3.27 x10E6/uL — ABNORMAL LOW (ref 4.14–5.80)
RDW: 14.8 % (ref 12.3–15.4)
RETIC CT PCT: 4.1 % — AB (ref 0.6–2.6)
TIBC: 201 ug/dL — ABNORMAL LOW (ref 250–450)
UIBC: 138 ug/dL — ABNORMAL LOW (ref 150–375)
VITAMIN B 12: 434 pg/mL (ref 211–946)
WBC: 6.7 10*3/uL (ref 3.4–10.8)

## 2013-06-09 LAB — LIPID PANEL
CHOL/HDL RATIO: 2.4 ratio (ref 0.0–5.0)
Cholesterol, Total: 114 mg/dL (ref 100–199)
HDL: 48 mg/dL (ref >39)
LDL CALC: 50 mg/dL (ref 0–99)
Triglycerides: 80 mg/dL (ref 0–149)
VLDL CHOLESTEROL CAL: 16 mg/dL (ref 5–40)

## 2013-06-09 LAB — HEPATIC FUNCTION PANEL
ALBUMIN: 4.2 g/dL (ref 3.5–5.5)
ALK PHOS: 81 IU/L (ref 39–117)
ALT: 44 IU/L (ref 0–44)
AST: 42 IU/L — ABNORMAL HIGH (ref 0–40)
BILIRUBIN TOTAL: 0.4 mg/dL (ref 0.0–1.2)
Bilirubin, Direct: 0.15 mg/dL (ref 0.00–0.40)
TOTAL PROTEIN: 7.3 g/dL (ref 6.0–8.5)

## 2013-06-09 LAB — THYROID PANEL WITH TSH
Free Thyroxine Index: 2.5 (ref 1.2–4.9)
T3 Uptake Ratio: 28 % (ref 24–39)
T4, Total: 9.1 ug/dL (ref 4.5–12.0)
TSH: 0.238 u[IU]/mL — ABNORMAL LOW (ref 0.450–4.500)

## 2013-06-09 LAB — MICROALBUMIN, URINE: MICROALBUM., U, RANDOM: 46 ug/mL — AB (ref 0.0–17.0)

## 2013-06-10 ENCOUNTER — Encounter (HOSPITAL_COMMUNITY)
Admission: RE | Admit: 2013-06-10 | Discharge: 2013-06-10 | Disposition: A | Discharge status: Home / Self Care | Payer: BC Managed Care – PPO | Source: Ambulatory Visit | Attending: Nephrology | Admitting: Nephrology

## 2013-06-10 DIAGNOSIS — N183 Chronic kidney disease, stage 3 unspecified: Secondary | ICD-10-CM | POA: Insufficient documentation

## 2013-06-10 DIAGNOSIS — D638 Anemia in other chronic diseases classified elsewhere: Secondary | ICD-10-CM | POA: Insufficient documentation

## 2013-06-10 MED ORDER — EPOETIN ALFA 20000 UNIT/ML IJ SOLN: 17500.0000 [IU] | INTRAMUSCULAR | Status: DC

## 2013-06-11 LAB — POCT HEMOGLOBIN-HEMACUE: Hemoglobin: 12.1 g/dL — ABNORMAL LOW (ref 13.0–17.0)

## 2013-06-15 ENCOUNTER — Other Ambulatory Visit: Payer: BC Managed Care – PPO

## 2013-06-15 DIAGNOSIS — E875 Hyperkalemia: Secondary | ICD-10-CM

## 2013-06-15 NOTE — Progress Notes (Signed)
Pt came in for labs only 

## 2013-06-17 LAB — BMP8+EGFR
BUN/Creatinine Ratio: 19 (ref 9–20)
BUN: 35 mg/dL — AB (ref 6–24)
CO2: 23 mmol/L (ref 18–29)
CREATININE: 1.87 mg/dL — AB (ref 0.76–1.27)
Calcium: 9.3 mg/dL (ref 8.7–10.2)
Chloride: 99 mmol/L (ref 97–108)
GFR, EST AFRICAN AMERICAN: 49 mL/min/{1.73_m2} — AB (ref >59)
GFR, EST NON AFRICAN AMERICAN: 43 mL/min/{1.73_m2} — AB (ref >59)
GLUCOSE: 157 mg/dL — AB (ref 65–99)
Potassium: 4.1 mmol/L (ref 3.5–5.2)
Sodium: 139 mmol/L (ref 134–144)

## 2013-06-23 DIAGNOSIS — Z961 Presence of intraocular lens: Secondary | ICD-10-CM | POA: Insufficient documentation

## 2013-06-30 ENCOUNTER — Other Ambulatory Visit: Payer: Self-pay | Admitting: Family Medicine

## 2013-07-05 ENCOUNTER — Other Ambulatory Visit: Payer: Self-pay | Admitting: *Deleted

## 2013-07-05 ENCOUNTER — Other Ambulatory Visit (INDEPENDENT_AMBULATORY_CARE_PROVIDER_SITE_OTHER): Payer: BC Managed Care – PPO

## 2013-07-05 DIAGNOSIS — E875 Hyperkalemia: Secondary | ICD-10-CM

## 2013-07-05 MED ORDER — OMEGA-3-ACID ETHYL ESTERS 1 G PO CAPS: 2.0000 g | ORAL_CAPSULE | Freq: Two times a day (BID) | ORAL | Status: DC

## 2013-07-05 NOTE — Progress Notes (Signed)
Pt changed insurance back to [facility] Medication now covered Needs refill

## 2013-07-05 NOTE — Progress Notes (Signed)
Pt came in for lab  only 

## 2013-07-06 LAB — BMP8+EGFR
BUN/Creatinine Ratio: 20 (ref 9–20)
BUN: 41 mg/dL — AB (ref 6–24)
CHLORIDE: 95 mmol/L — AB (ref 97–108)
CO2: 21 mmol/L (ref 18–29)
Calcium: 10 mg/dL (ref 8.7–10.2)
Creatinine, Ser: 2.05 mg/dL — ABNORMAL HIGH (ref 0.76–1.27)
GFR calc Af Amer: 44 mL/min/{1.73_m2} — ABNORMAL LOW (ref >59)
GFR, EST NON AFRICAN AMERICAN: 38 mL/min/{1.73_m2} — AB (ref >59)
GLUCOSE: 195 mg/dL — AB (ref 65–99)
Potassium: 4.8 mmol/L (ref 3.5–5.2)
Sodium: 134 mmol/L (ref 134–144)

## 2013-07-06 NOTE — Progress Notes (Signed)
This is okay to refill, I'm not sure which medicine it is.

## 2013-07-08 ENCOUNTER — Encounter (HOSPITAL_COMMUNITY): Payer: BC Managed Care – PPO

## 2013-07-19 ENCOUNTER — Other Ambulatory Visit: Payer: Self-pay | Admitting: Internal Medicine

## 2013-07-20 ENCOUNTER — Other Ambulatory Visit: Payer: Self-pay | Admitting: Licensed Clinical Social Worker

## 2013-07-20 ENCOUNTER — Encounter (HOSPITAL_COMMUNITY)
Admission: RE | Admit: 2013-07-20 | Payer: BC Managed Care – PPO | Source: Ambulatory Visit | Attending: Family Medicine | Admitting: Family Medicine

## 2013-07-20 MED ORDER — DOLUTEGRAVIR SODIUM 50 MG PO TABS: ORAL_TABLET | ORAL | Status: DC

## 2013-07-20 MED ORDER — ABACAVIR SULFATE-LAMIVUDINE 600-300 MG PO TABS: ORAL_TABLET | ORAL | Status: DC

## 2013-07-22 ENCOUNTER — Other Ambulatory Visit (INDEPENDENT_AMBULATORY_CARE_PROVIDER_SITE_OTHER): Payer: BC Managed Care – PPO

## 2013-07-22 DIAGNOSIS — Z8639 Personal history of other endocrine, nutritional and metabolic disease: Secondary | ICD-10-CM

## 2013-07-22 DIAGNOSIS — E875 Hyperkalemia: Secondary | ICD-10-CM

## 2013-07-23 ENCOUNTER — Telehealth: Payer: Self-pay | Admitting: Licensed Clinical Social Worker

## 2013-07-23 LAB — CBC WITH DIFFERENTIAL
Basophils Absolute: 0.1 10*3/uL (ref 0.0–0.2)
Basos: 1 %
EOS: 6 %
Eosinophils Absolute: 0.5 10*3/uL — ABNORMAL HIGH (ref 0.0–0.4)
HEMATOCRIT: 34.1 % — AB (ref 37.5–51.0)
Hemoglobin: 11.4 g/dL — ABNORMAL LOW (ref 12.6–17.7)
Immature Grans (Abs): 0 10*3/uL (ref 0.0–0.1)
Immature Granulocytes: 0 %
LYMPHS ABS: 4.2 10*3/uL — AB (ref 0.7–3.1)
Lymphs: 50 %
MCH: 33 pg (ref 26.6–33.0)
MCHC: 33.4 g/dL (ref 31.5–35.7)
MCV: 99 fL — AB (ref 79–97)
Monocytes Absolute: 0.9 10*3/uL (ref 0.1–0.9)
Monocytes: 11 %
Neutrophils Absolute: 2.6 10*3/uL (ref 1.4–7.0)
Neutrophils Relative %: 32 %
PLATELETS: 180 10*3/uL (ref 150–379)
RBC: 3.45 x10E6/uL — AB (ref 4.14–5.80)
RDW: 13.9 % (ref 12.3–15.4)
WBC: 8.3 10*3/uL (ref 3.4–10.8)

## 2013-07-23 LAB — BMP8+EGFR
BUN / CREAT RATIO: 15 (ref 9–20)
BUN: 33 mg/dL — AB (ref 6–24)
CALCIUM: 9.5 mg/dL (ref 8.7–10.2)
CO2: 21 mmol/L (ref 18–29)
CREATININE: 2.22 mg/dL — AB (ref 0.76–1.27)
Chloride: 98 mmol/L (ref 97–108)
GFR calc Af Amer: 40 mL/min/{1.73_m2} — ABNORMAL LOW (ref >59)
GFR calc non Af Amer: 35 mL/min/{1.73_m2} — ABNORMAL LOW (ref >59)
Glucose: 231 mg/dL — ABNORMAL HIGH (ref 65–99)
Potassium: 4.4 mmol/L (ref 3.5–5.2)
Sodium: 137 mmol/L (ref 134–144)

## 2013-07-23 NOTE — Telephone Encounter (Signed)
Patient's mail order [facility] sent a fax requesting more information from the patient's plan before they will fill medications. I called the patients cell phone and left a message along with the telephone number

## 2013-07-26 ENCOUNTER — Telehealth: Payer: Self-pay | Admitting: *Deleted

## 2013-07-26 ENCOUNTER — Other Ambulatory Visit: Payer: Self-pay | Admitting: *Deleted

## 2013-07-26 MED ORDER — ABACAVIR SULFATE-LAMIVUDINE 600-300 MG PO TABS: ORAL_TABLET | ORAL | Status: DC

## 2013-07-26 MED ORDER — DOLUTEGRAVIR SODIUM 50 MG PO TABS: ORAL_TABLET | ORAL | Status: DC

## 2013-07-26 NOTE — Telephone Encounter (Signed)
Patient called stating he has one more day of his HIV medications and is having trouble having them filled through [facility]. I spoke with pharmacist and provided new prescriptions. They can arrange overnight shipment but they do need to speak with the patient. I called him back twice and got his voicemail, left message pharmacy will try to contact him. It is [facility] in [place] for phone prescriptions [phone]. But if sent electronically it must go to [place]. Myrtis Hopping

## 2013-07-29 ENCOUNTER — Other Ambulatory Visit: Payer: Self-pay | Admitting: Family Medicine

## 2013-07-30 NOTE — Telephone Encounter (Signed)
Last seen 06/08/13  DWM  If approved route to nurse to call into [facility]  [phone]

## 2013-08-01 NOTE — Telephone Encounter (Signed)
rx ready for pickup 

## 2013-08-02 ENCOUNTER — Other Ambulatory Visit: Payer: Self-pay | Admitting: Family Medicine

## 2013-08-02 ENCOUNTER — Other Ambulatory Visit: Payer: Self-pay | Admitting: General Practice

## 2013-08-02 ENCOUNTER — Other Ambulatory Visit: Payer: Self-pay | Admitting: Internal Medicine

## 2013-08-02 NOTE — Telephone Encounter (Signed)
Left message to pick up RX

## 2013-08-03 NOTE — Telephone Encounter (Signed)
Last seen 06/08/13  DWM

## 2013-08-13 ENCOUNTER — Other Ambulatory Visit (INDEPENDENT_AMBULATORY_CARE_PROVIDER_SITE_OTHER): Payer: BC Managed Care – PPO

## 2013-08-14 LAB — BMP8+EGFR
BUN / CREAT RATIO: 17 (ref 9–20)
BUN: 33 mg/dL — ABNORMAL HIGH (ref 6–24)
CALCIUM: 9.3 mg/dL (ref 8.7–10.2)
CO2: 23 mmol/L (ref 18–29)
CREATININE: 1.95 mg/dL — AB (ref 0.76–1.27)
Chloride: 94 mmol/L — ABNORMAL LOW (ref 97–108)
GFR calc Af Amer: 47 mL/min/{1.73_m2} — ABNORMAL LOW (ref >59)
GFR calc non Af Amer: 41 mL/min/{1.73_m2} — ABNORMAL LOW (ref >59)
Glucose: 276 mg/dL — ABNORMAL HIGH (ref 65–99)
Potassium: 4.1 mmol/L (ref 3.5–5.2)
Sodium: 134 mmol/L (ref 134–144)

## 2013-08-18 ENCOUNTER — Other Ambulatory Visit: Payer: Self-pay | Admitting: General Practice

## 2013-08-19 ENCOUNTER — Ambulatory Visit (INDEPENDENT_AMBULATORY_CARE_PROVIDER_SITE_OTHER): Payer: BC Managed Care – PPO | Admitting: Internal Medicine

## 2013-08-19 ENCOUNTER — Encounter: Payer: Self-pay | Admitting: Internal Medicine

## 2013-08-19 VITALS — BP 131/95 | HR 112 | Temp 97.3°F | Wt 213.0 lb

## 2013-08-19 DIAGNOSIS — B20 Human immunodeficiency virus [HIV] disease: Secondary | ICD-10-CM

## 2013-08-19 NOTE — Assessment & Plan Note (Signed)
Labs today and if ok, rtc 4 months.  Told to stop dapsone now and will notify if he needs it otherwise.

## 2013-08-19 NOTE — Progress Notes (Signed)
  Subjective:    Patient ID: Patrick Brown, male    DOB: 01/28/1971, 43 y.o.   MRN: [id number]  HPI  Is in for followup of HIV.  He continues on [place] and Epzicom. He is also on dapsone for PCP prophylaxis and azithromycin for MAC prophylaxis. He feels much better and his skin seems to be clearing up some, though still has puritis.   His pulmonary symptoms though have cleared. He denies any missed doses of his medications.  Last CD4 was 300.     Review of Systems  Constitutional: Positive for fatigue. Negative for fever.  HENT: Negative for sore throat.   Eyes: Negative for visual disturbance.  Respiratory: Negative for shortness of breath.   Gastrointestinal: Negative for nausea, abdominal pain and diarrhea.  Musculoskeletal: Negative for arthralgias and back pain.  Skin: Positive for rash.       Improved rash  Neurological: Negative for dizziness, light-headedness and headaches.  Hematological: Negative for adenopathy.       Objective:   Physical Exam  Constitutional: He is oriented to person, place, and time. He appears well-developed and well-nourished. No distress.  Cardiovascular: Normal rate, regular rhythm and normal heart sounds.   No murmur heard. Pulmonary/Chest: Effort normal and breath sounds normal. No respiratory distress.  Neurological: He is alert and oriented to person, place, and time.  Skin:  Areas of small pinpoint rashes signs of scratching, improved  Psychiatric: He has a normal mood and affect.          Assessment & Plan:

## 2013-08-20 ENCOUNTER — Telehealth: Payer: Self-pay | Admitting: *Deleted

## 2013-08-20 LAB — T-HELPER CELL (CD4) - (RCID CLINIC ONLY)
CD4 % Helper T Cell: 7 % — ABNORMAL LOW (ref 33–55)
CD4 T CELL ABS: 330 /uL — AB (ref 400–2700)

## 2013-08-20 LAB — HIV-1 RNA QUANT-NO REFLEX-BLD: HIV-1 RNA Quant, Log: <1.3 {Log} (ref <1.30)

## 2013-08-20 NOTE — Telephone Encounter (Signed)
Message copied by Landis Gandy on Fri Aug 20, 2013  3:48 PM ------      Message from: Thayer Headings      Created: Fri Aug 20, 2013  1:21 PM       Let him know that he does not need dapsone prophylaxis anymore.  CD4 is 330.   ------

## 2013-08-20 NOTE — Telephone Encounter (Signed)
Notified patient, confirmed upcoming appointment. Landis Gandy, RN

## 2013-09-02 ENCOUNTER — Other Ambulatory Visit (INDEPENDENT_AMBULATORY_CARE_PROVIDER_SITE_OTHER): Payer: BC Managed Care – PPO

## 2013-09-02 DIAGNOSIS — D62 Acute posthemorrhagic anemia: Secondary | ICD-10-CM

## 2013-09-02 DIAGNOSIS — E875 Hyperkalemia: Secondary | ICD-10-CM

## 2013-09-03 ENCOUNTER — Telehealth: Payer: Self-pay | Admitting: *Deleted

## 2013-09-03 LAB — CBC WITH DIFFERENTIAL
Basophils Absolute: 0.1 10*3/uL (ref 0.0–0.2)
Basos: 1 %
EOS ABS: 0.2 10*3/uL (ref 0.0–0.4)
Eos: 2 %
HCT: 39.6 % (ref 37.5–51.0)
Hemoglobin: 13.1 g/dL (ref 12.6–17.7)
IMMATURE GRANS (ABS): 0 10*3/uL (ref 0.0–0.1)
IMMATURE GRANULOCYTES: 0 %
LYMPHS ABS: 4 10*3/uL — AB (ref 0.7–3.1)
Lymphs: 38 %
MCH: 33.4 pg — AB (ref 26.6–33.0)
MCHC: 33.1 g/dL (ref 31.5–35.7)
MCV: 101 fL — AB (ref 79–97)
MONOS ABS: 0.7 10*3/uL (ref 0.1–0.9)
Monocytes: 6 %
Neutrophils Absolute: 5.4 10*3/uL (ref 1.4–7.0)
Neutrophils Relative %: 53 %
PLATELETS: 199 10*3/uL (ref 150–379)
RBC: 3.92 x10E6/uL — ABNORMAL LOW (ref 4.14–5.80)
RDW: 14.3 % (ref 12.3–15.4)
WBC: 10.5 10*3/uL (ref 3.4–10.8)

## 2013-09-03 LAB — BMP8+EGFR
BUN / CREAT RATIO: 18 (ref 9–20)
BUN: 37 mg/dL — ABNORMAL HIGH (ref 6–24)
CHLORIDE: 90 mmol/L — AB (ref 97–108)
CO2: 27 mmol/L (ref 18–29)
Calcium: 9.7 mg/dL (ref 8.7–10.2)
Creatinine, Ser: 2.03 mg/dL — ABNORMAL HIGH (ref 0.76–1.27)
GFR calc non Af Amer: 39 mL/min/{1.73_m2} — ABNORMAL LOW (ref >59)
GFR, EST AFRICAN AMERICAN: 45 mL/min/{1.73_m2} — AB (ref >59)
Glucose: 474 mg/dL (ref 65–99)
POTASSIUM: 4.7 mmol/L (ref 3.5–5.2)
SODIUM: 132 mmol/L — AB (ref 134–144)

## 2013-09-03 NOTE — Telephone Encounter (Signed)
Labs were drawn in our office on 7/30 per order from Dr. Antionette Fairy. Glucose was 474 at time of draw. Spoke with patient and his blood sugar is stable now. He was aware of the elevation yesterday because he checked it before coming to our office.  He had eaten and forgot to bolus his insulin.  Other lab values appear to be similar to previous results. These will be reviewed by Dr. Antionette Fairy.

## 2013-09-09 ENCOUNTER — Other Ambulatory Visit: Payer: Self-pay | Admitting: Family Medicine

## 2013-09-15 ENCOUNTER — Other Ambulatory Visit (INDEPENDENT_AMBULATORY_CARE_PROVIDER_SITE_OTHER): Payer: BC Managed Care – PPO

## 2013-09-15 DIAGNOSIS — E875 Hyperkalemia: Secondary | ICD-10-CM

## 2013-09-15 NOTE — Progress Notes (Signed)
Patient came in for labs only.

## 2013-09-16 LAB — BMP8+EGFR
BUN / CREAT RATIO: 15 (ref 9–20)
BUN: 30 mg/dL — ABNORMAL HIGH (ref 6–24)
CALCIUM: 9.8 mg/dL (ref 8.7–10.2)
CO2: 23 mmol/L (ref 18–29)
CREATININE: 1.94 mg/dL — AB (ref 0.76–1.27)
Chloride: 96 mmol/L — ABNORMAL LOW (ref 97–108)
GFR calc Af Amer: 47 mL/min/{1.73_m2} — ABNORMAL LOW (ref >59)
GFR, EST NON AFRICAN AMERICAN: 41 mL/min/{1.73_m2} — AB (ref >59)
Glucose: 219 mg/dL — ABNORMAL HIGH (ref 65–99)
POTASSIUM: 5.1 mmol/L (ref 3.5–5.2)
Sodium: 139 mmol/L (ref 134–144)

## 2013-10-01 ENCOUNTER — Ambulatory Visit (INDEPENDENT_AMBULATORY_CARE_PROVIDER_SITE_OTHER): Payer: BC Managed Care – PPO | Admitting: Family

## 2013-10-01 ENCOUNTER — Encounter: Payer: Self-pay | Admitting: Family

## 2013-10-01 ENCOUNTER — Ambulatory Visit (INDEPENDENT_AMBULATORY_CARE_PROVIDER_SITE_OTHER): Payer: BC Managed Care – PPO

## 2013-10-01 VITALS — BP 133/90 | HR 92 | Temp 97.8°F | Ht 70.0 in | Wt 214.0 lb

## 2013-10-01 DIAGNOSIS — R7989 Other specified abnormal findings of blood chemistry: Secondary | ICD-10-CM

## 2013-10-01 DIAGNOSIS — M79671 Pain in right foot: Secondary | ICD-10-CM

## 2013-10-01 DIAGNOSIS — M79609 Pain in unspecified limb: Secondary | ICD-10-CM

## 2013-10-01 DIAGNOSIS — M10071 Idiopathic gout, right ankle and foot: Secondary | ICD-10-CM

## 2013-10-01 DIAGNOSIS — M109 Gout, unspecified: Secondary | ICD-10-CM

## 2013-10-01 MED ORDER — COLCHICINE 0.6 MG PO TABS: 0.6000 mg | ORAL_TABLET | Freq: Every day | ORAL | Status: DC

## 2013-10-01 NOTE — Progress Notes (Signed)
   Subjective:    Patient ID: Patrick Brown, male    DOB: 06-Jun-1970, 43 y.o.   MRN: [id number]  Foot Pain This is a new problem. The current episode started in the past 7 days (Tuesday). The problem occurs intermittently. The problem has been gradually worsening. Pertinent negatives include no chills, coughing, fatigue, fever, nausea, numbness, rash or vomiting. The symptoms are aggravated by standing and walking. He has tried rest (Ultram) for the symptoms. The treatment provided mild relief.    *Pt currently on Uloric 40 mg daily.   Review of Systems  Constitutional: Negative.  Negative for fever, chills and fatigue.  HENT: Negative.   Respiratory: Negative.  Negative for cough.   Cardiovascular: Negative.   Gastrointestinal: Negative.  Negative for nausea and vomiting.  Endocrine: Negative.   Genitourinary: Negative.   Musculoskeletal: Positive for gait problem.  Skin: Negative for rash.  Neurological: Negative.  Negative for numbness.  Hematological: Negative.   Psychiatric/Behavioral: Negative.   All other systems reviewed and are negative.      Objective:   Physical Exam  Vitals reviewed. Constitutional: He is oriented to person, place, and time. He appears well-developed and well-nourished. No distress.  HENT:  Head: Normocephalic.  Right Ear: External ear normal.  Left Ear: External ear normal.  Nose: Nose normal.  Mouth/Throat: Oropharynx is clear and moist.  Eyes: Pupils are equal, round, and reactive to light. Right eye exhibits no discharge. Left eye exhibits no discharge.  Neck: Normal range of motion. Neck supple. No thyromegaly present.  Cardiovascular: Normal rate, regular rhythm, normal heart sounds and intact distal pulses.   No murmur heard. Pulmonary/Chest: Effort normal and breath sounds normal. No respiratory distress. He has no wheezes.  Abdominal: Soft. Bowel sounds are normal. He exhibits no distension. There is no tenderness.  Musculoskeletal:  Normal range of motion. He exhibits no edema and no tenderness.  Neurological: He is alert and oriented to person, place, and time. He has normal reflexes. No cranial nerve deficit.  Skin: Skin is warm and dry. No rash noted. No erythema.  Psychiatric: He has a normal mood and affect. His behavior is normal. Judgment and thought content normal.    BP 133/90  Pulse 92  Temp(Src) 97.8 F (36.6 C) (Oral)  Ht _0  (1.778 m)  Wt 214 lb (97.07 kg)  BMI 30.71 kg/m2   X-Ray-WNL Preliminary reading by Evelina Dun, FNP [facility]     Assessment & Plan:  1. Other abnormal blood chemistry - CBC With differential/Platelet - BMP8+EGFR  2. Right foot pain - Uric acid - DG Foot Complete Right; Future  3. Acute idiopathic gout of right foot -If pain and swelling does not improve with medication will need to RTO-Antibiotic? -Will wait on Uric acid levels to come back also - colchicine 0.6 MG tablet; Take 1 tablet (0.6 mg total) by mouth daily.  Dispense: 60 tablet; Refill: [place], FNP

## 2013-10-01 NOTE — Patient Instructions (Signed)

## 2013-10-02 LAB — BMP8+EGFR
BUN / CREAT RATIO: 17 (ref 9–20)
BUN: 31 mg/dL — ABNORMAL HIGH (ref 6–24)
CHLORIDE: 94 mmol/L — AB (ref 97–108)
CO2: 25 mmol/L (ref 18–29)
Calcium: 9.8 mg/dL (ref 8.7–10.2)
Creatinine, Ser: 1.79 mg/dL — ABNORMAL HIGH (ref 0.76–1.27)
GFR, EST AFRICAN AMERICAN: 52 mL/min/{1.73_m2} — AB (ref >59)
GFR, EST NON AFRICAN AMERICAN: 45 mL/min/{1.73_m2} — AB (ref >59)
Glucose: 214 mg/dL — ABNORMAL HIGH (ref 65–99)
POTASSIUM: 4.4 mmol/L (ref 3.5–5.2)
SODIUM: 135 mmol/L (ref 134–144)

## 2013-10-02 LAB — CBC WITH DIFFERENTIAL
BASOS ABS: 0.1 10*3/uL (ref 0.0–0.2)
Basos: 1 %
EOS ABS: 0.5 10*3/uL — AB (ref 0.0–0.4)
Eos: 5 %
HCT: 40.6 % (ref 37.5–51.0)
Hemoglobin: 13.5 g/dL (ref 12.6–17.7)
Immature Grans (Abs): 0 10*3/uL (ref 0.0–0.1)
Immature Granulocytes: 0 %
LYMPHS ABS: 4.5 10*3/uL — AB (ref 0.7–3.1)
LYMPHS: 51 %
MCH: 33.3 pg — ABNORMAL HIGH (ref 26.6–33.0)
MCHC: 33.3 g/dL (ref 31.5–35.7)
MCV: 100 fL — ABNORMAL HIGH (ref 79–97)
Monocytes Absolute: 1.1 10*3/uL — ABNORMAL HIGH (ref 0.1–0.9)
Monocytes: 12 %
Neutrophils Absolute: 2.8 10*3/uL (ref 1.4–7.0)
Neutrophils Relative %: 31 %
Platelets: 175 10*3/uL (ref 150–379)
RBC: 4.05 x10E6/uL — ABNORMAL LOW (ref 4.14–5.80)
RDW: 14.2 % (ref 12.3–15.4)
WBC: 8.9 10*3/uL (ref 3.4–10.8)

## 2013-10-02 LAB — URIC ACID: URIC ACID: 8.7 mg/dL — AB (ref 3.7–8.6)

## 2013-10-10 ENCOUNTER — Other Ambulatory Visit: Payer: Self-pay | Admitting: Family Medicine

## 2013-10-11 NOTE — Telephone Encounter (Signed)
If approved, need directions

## 2013-10-11 NOTE — Telephone Encounter (Signed)
Take the prednisone as directed

## 2013-10-12 ENCOUNTER — Encounter: Payer: Self-pay | Admitting: Family Medicine

## 2013-10-12 ENCOUNTER — Ambulatory Visit (INDEPENDENT_AMBULATORY_CARE_PROVIDER_SITE_OTHER): Payer: BC Managed Care – PPO | Admitting: Family Medicine

## 2013-10-12 VITALS — BP 127/84 | HR 88 | Temp 97.0°F | Ht 70.0 in | Wt 214.0 lb

## 2013-10-12 DIAGNOSIS — IMO0002 Reserved for concepts with insufficient information to code with codable children: Secondary | ICD-10-CM

## 2013-10-12 DIAGNOSIS — E785 Hyperlipidemia, unspecified: Secondary | ICD-10-CM

## 2013-10-12 DIAGNOSIS — E559 Vitamin D deficiency, unspecified: Secondary | ICD-10-CM

## 2013-10-12 DIAGNOSIS — N183 Chronic kidney disease, stage 3 unspecified: Secondary | ICD-10-CM

## 2013-10-12 DIAGNOSIS — M10071 Idiopathic gout, right ankle and foot: Secondary | ICD-10-CM

## 2013-10-12 DIAGNOSIS — M109 Gout, unspecified: Secondary | ICD-10-CM

## 2013-10-12 DIAGNOSIS — B20 Human immunodeficiency virus [HIV] disease: Secondary | ICD-10-CM

## 2013-10-12 DIAGNOSIS — E039 Hypothyroidism, unspecified: Secondary | ICD-10-CM

## 2013-10-12 DIAGNOSIS — E1065 Type 1 diabetes mellitus with hyperglycemia: Secondary | ICD-10-CM

## 2013-10-12 MED ORDER — COLCHICINE 0.6 MG PO TABS: 0.6000 mg | ORAL_TABLET | Freq: Every day | ORAL | Status: DC

## 2013-10-12 MED ORDER — LORAZEPAM 0.5 MG PO TABS: ORAL_TABLET | ORAL | Status: DC

## 2013-10-12 NOTE — Progress Notes (Signed)
Subjective:    Patient ID: Patrick Brown, male    DOB: [birth year], 43 y.o.   MRN: [id number]  HPI Pt here for follow up and management of chronic medical problems. The patient is feeling good, despite his multiple medical problems and multiple medications that he is having to take. He is complaining of some discomfort in his feet and some swelling. He is due to get lab work and he will return for this fasting. He is seeing a rheumatologist, a pulmonologist, and infectious disease doctor and a nephrologist. Dr. Antionette Fairy is a nephrologist, Dr.Comer is the infectious disease doctor and Dr. Gwenette Greet is the pulmonologist .        Patient Active Problem List   Diagnosis Date Noted  . Pain in joint, lower leg 11/19/2012  . Unspecified sinusitis (chronic) 09/16/2012  . Pneumocystis carinii pneumonia 08/19/2012  . Itch 08/17/2012  . Hypercalcemia 08/09/2012  . Right groin wound 08/09/2012  . Hyperkalemia 08/07/2012  . Acute respiratory failure with hypoxia 08/05/2012  . Hyponatremia 08/05/2012  . HCAP (healthcare-associated pneumonia) 08/05/2012  . SOB (shortness of breath) 07/14/2012  . HIV disease 06/02/2012  . Convulsions/seizures 05/16/2012  . SIRS (systemic inflammatory response syndrome) 04/18/2012  . Pulmonary infiltrate 04/18/2012  . Gastroparesis 04/18/2012  . Sinus tachycardia 04/18/2012  . Near syncope 04/16/2012  . Anemia, iron deficiency   . DM (diabetes mellitus), type 1, uncontrolled 05/08/2011  . Hypothyroid 05/08/2011  . Chronic kidney disease, stage 3, mod decreased GFR 12/31/2010   Outpatient Encounter Prescriptions as of 10/12/2013  Medication Sig  . abacavir-lamiVUDine (EPZICOM) 600-300 MG per tablet TAKE ONE TABLET (600MG -300MG ) BY MOUTH ONCE DAILY AT BEDTIME. STORE ATCONTROLLED ROOM TEMPERATURE.  Marland Kitchen acetaminophen (TYLENOL) 500 MG tablet Take 1,000 mg by mouth 2 (two) times daily.   . ANDROGEL PUMP 20.25 MG/ACT (1.62%) GEL APPLY 2 PUMPS DAILY AS DIRECTED  .  aspirin 81 MG chewable tablet Chew 81 mg by mouth every morning.  . cyclobenzaprine (FLEXERIL) 10 MG tablet Take 1 tablet (10 mg total) by mouth 3 (three) times daily as needed for muscle spasms.  . dolutegravir (TIVICAY) 50 MG tablet TAKE ONE TABLET (50MG ) BY MOUTH ONCE DAILY AT BEDTIME. STORE AT CONTROLLED ROOM TEMPERATURE.  . febuxostat (ULORIC) 40 MG tablet Take 40 mg by mouth daily.  . ferrous sulfate 325 (65 FE) MG tablet Take 650 mg by mouth daily with breakfast.   . fludrocortisone (FLORINEF) 0.1mg /mL SUSP Take 0.2 mg by mouth daily.   . furosemide (LASIX) 20 MG tablet Take 20 mg by mouth 2 (two) times daily.  . Glucosamine-Chondroit-Vit C-Mn (GLUCOSAMINE 1500 COMPLEX PO) Take 1 tablet by mouth 2 (two) times daily.   . hydrOXYzine (ATARAX/VISTARIL) 25 MG tablet TAKE 1 TABLET (25 MG TOTAL) BY MOUTH EVERY 6 (SIX) HOURS AS NEEDED FOR ITCHING.  Marland Kitchen levothyroxine (SYNTHROID, LEVOTHROID) 200 MCG tablet Take 200 mcg by mouth daily before breakfast.  . loratadine (CLARITIN) 10 MG tablet Take 10 mg by mouth daily as needed for allergies.  Marland Kitchen LORazepam (ATIVAN) 0.5 MG tablet TAKE 1 TABLET BY MOUTH EVERY DAY AS NEEDED  . metoCLOPramide (REGLAN) 5 MG tablet TAKE 1 TABLET BY MOUTH 3 TIMES A DAY BEFORE MEALS  . NEXIUM 40 MG capsule TAKE ONE CAPSULE BY MOUTH DAILY  . NIASPAN 500 MG CR tablet TAKE 3 TABLETS BY MOUTH EVERY DAY  . NOVOLOG 100 UNIT/ML injection USE PER PUMP AS DIRECTED  . omega-3 acid ethyl esters (LOVAZA) 1 G capsule Take 2 capsules (  2 g total) by mouth 2 (two) times daily.  . ondansetron (ZOFRAN) 4 MG tablet Take 1 tablet (4 mg total) by mouth every 8 (eight) hours as needed for nausea.  Marland Kitchen OVER THE COUNTER MEDICATION Take 1 capsule by mouth daily. [name]- probiotic daily  . predniSONE (DELTASONE) 10 MG tablet TAKE AS DIRECTED  . simvastatin (ZOCOR) 40 MG tablet TAKE 1 TABLET (40 MG TOTAL) BY MOUTH AT BEDTIME.  . sodium bicarbonate 650 MG tablet Take 650 mg by mouth every morning.   .  traMADol (ULTRAM) 50 MG tablet Take 1 tablet (50 mg total) by mouth every 6 (six) hours as needed for pain.  . valACYclovir (VALTREX) 500 MG tablet Take 500 mg by mouth 2 (two) times daily.  Marland Kitchen venlafaxine XR (EFFEXOR-XR) 37.5 MG 24 hr capsule TAKE 3 CAPSULES BY MOUTH EVERY DAY  . [DISCONTINUED] ampicillin (PRINCIPEN) 250 MG capsule Take 250 mg by mouth 2 (two) times daily at 10 am and 4 pm.  . [DISCONTINUED] dapsone 100 MG tablet TAKE 1 TABLET (100 MG TOTAL) BY MOUTH DAILY.  . [DISCONTINUED] Insulin Human (INSULIN PUMP) 100 unit/ml SOLN Inject into the skin continuous. He uses Novolog Insulin in pump. Per sliding scale.  . [DISCONTINUED] Levocetirizine Dihydrochloride (XYZAL PO) Take by mouth. Takes 1/2 cap  . epoetin alfa (EPOGEN,PROCRIT) 22979 UNIT/ML injection Inject 17,500 Units into the skin every 30 (thirty) days.   . [DISCONTINUED] colchicine 0.6 MG tablet Take 1 tablet (0.6 mg total) by mouth daily.  . [DISCONTINUED] Omega-3 Fatty Acids (FISH OIL) 1000 MG CAPS Take 2 capsules by mouth daily.    Review of Systems  Constitutional: Negative.   HENT: Negative.   Eyes: Negative.   Respiratory: Negative.   Cardiovascular: Negative.   Gastrointestinal: Negative.   Endocrine: Negative.   Genitourinary: Negative.   Musculoskeletal: Negative.   Skin: Negative.   Allergic/Immunologic: Negative.   Neurological: Negative.   Hematological: Negative.   Psychiatric/Behavioral: Negative.        Objective:   Physical Exam  Nursing note and vitals reviewed. Constitutional: He is oriented to person, place, and time. He appears well-developed and well-nourished. No distress.  HENT:  Head: Normocephalic and atraumatic.  Right Ear: External ear normal.  Left Ear: External ear normal.  Nose: Nose normal.  Mouth/Throat: Oropharynx is clear and moist. No oropharyngeal exudate.  Eyes: Conjunctivae and EOM are normal. Pupils are equal, round, and reactive to light. Right eye exhibits no  discharge. Left eye exhibits no discharge. No scleral icterus.  Neck: Normal range of motion. Neck supple. No thyromegaly present.  No adenopathy  Cardiovascular: Normal rate, regular rhythm, normal heart sounds and intact distal pulses.  Exam reveals no gallop and no friction rub.   No murmur heard. Pulmonary/Chest: Effort normal and breath sounds normal. No respiratory distress. He has no wheezes. He has no rales. He exhibits no tenderness.  Good breath sounds. No axillary adenopathy  Abdominal: Soft. Bowel sounds are normal. He exhibits no mass. There is no tenderness. There is no rebound and no guarding.  Normal bowel sounds and no bruits  Musculoskeletal: Normal range of motion. He exhibits no edema and no tenderness.  Lymphadenopathy:    He has no cervical adenopathy.  Neurological: He is alert and oriented to person, place, and time. He has normal reflexes. No cranial nerve deficit.  Skin: Skin is warm and dry. No rash noted. No erythema. No pallor.  The body sores that the patient has had in the past all  appear to be healing.  Psychiatric: He has a normal mood and affect. His behavior is normal. Judgment and thought content normal.   BP 127/84  Pulse 88  Temp(Src) 97 F (36.1 C) (Oral)  Ht $R'5\' 10"'bB$  (1.778 m)  Wt 214 lb (97.07 kg)  BMI 30.71 kg/m2        Assessment & Plan:  1. DM (diabetes mellitus), type 1, uncontrolled - POCT CBC; Future - POCT glycosylated hemoglobin (Hb A1C); Future - Hepatic function panel; Future  2. Chronic kidney disease, stage 3, mod decreased GFR - POCT CBC; Future - BMP8+EGFR; Future  3. HIV disease - POCT CBC; Future - Hepatic function panel; Future  4. Hypothyroidism, unspecified hypothyroidism type - POCT CBC; Future  5. Vitamin D deficiency - Vit D  25 hydroxy (rtn osteoporosis monitoring); Future  6. Hyperlipidemia - Hepatic function panel; Future - NMR, lipoprofile; Future  7. Acute idiopathic gout of right foot - colchicine  0.6 MG tablet; Take 1 tablet (0.6 mg total) by mouth daily.  Dispense: 30 tablet; Refill: 1 - Uric acid; Future  Meds ordered this encounter  Medications  . OVER THE COUNTER MEDICATION    Sig: Take 1 capsule by mouth daily. [name]- probiotic daily  . LORazepam (ATIVAN) 0.5 MG tablet    Sig: TAKE 1 TABLET BY MOUTH EVERY DAY AS NEEDED    Dispense:  30 tablet    Refill:  2  . colchicine 0.6 MG tablet    Sig: Take 1 tablet (0.6 mg total) by mouth daily.    Dispense:  30 tablet    Refill:  1    Take 1.2 mg orally initially followed by 0.6 mg orally after 1 hours, max 1.8 mg total daily dose   Patient Instructions  Continue current medications. Continue good therapeutic lifestyle changes which include good diet and exercise. Fall precautions discussed with patient. If an FOBT was given today- please return it to our front desk. If you are over 4 years old - you may need Prevnar 44 or the adult Pneumonia vaccine.  Flu Shots will be available at our office starting mid- September. Please call and schedule a FLU CLINIC APPOINTMENT.   Continue to follow up with your specialists.  Continue to monitor your blood sugars closely. Return to clinic for your lab work fasting.   Arrie Senate MD

## 2013-10-12 NOTE — Patient Instructions (Addendum)
Continue current medications. Continue good therapeutic lifestyle changes which include good diet and exercise. Fall precautions discussed with patient. If an FOBT was given today- please return it to our front desk. If you are over 45 years old - you may need Prevnar 39 or the adult Pneumonia vaccine.  Flu Shots will be available at our office starting mid- September. Please call and schedule a FLU CLINIC APPOINTMENT.   Continue to follow up with your specialists.  Continue to monitor your blood sugars closely. Return to clinic for your lab work fasting.

## 2013-10-15 ENCOUNTER — Encounter: Payer: Self-pay | Admitting: Pulmonary Disease

## 2013-10-15 ENCOUNTER — Ambulatory Visit (INDEPENDENT_AMBULATORY_CARE_PROVIDER_SITE_OTHER)
Admission: RE | Admit: 2013-10-15 | Discharge: 2013-10-15 | Disposition: A | Discharge status: Home / Self Care | Payer: BC Managed Care – PPO | Source: Ambulatory Visit | Attending: Pulmonary Disease | Admitting: Pulmonary Disease

## 2013-10-15 ENCOUNTER — Ambulatory Visit (INDEPENDENT_AMBULATORY_CARE_PROVIDER_SITE_OTHER): Payer: BC Managed Care – PPO | Admitting: Pulmonary Disease

## 2013-10-15 ENCOUNTER — Other Ambulatory Visit (INDEPENDENT_AMBULATORY_CARE_PROVIDER_SITE_OTHER): Payer: BC Managed Care – PPO

## 2013-10-15 VITALS — BP 130/74 | HR 76 | Temp 98.1°F | Ht 70.0 in | Wt 214.0 lb

## 2013-10-15 DIAGNOSIS — E1065 Type 1 diabetes mellitus with hyperglycemia: Secondary | ICD-10-CM

## 2013-10-15 DIAGNOSIS — N183 Chronic kidney disease, stage 3 unspecified: Secondary | ICD-10-CM

## 2013-10-15 DIAGNOSIS — E039 Hypothyroidism, unspecified: Secondary | ICD-10-CM

## 2013-10-15 DIAGNOSIS — J189 Pneumonia, unspecified organism: Secondary | ICD-10-CM

## 2013-10-15 DIAGNOSIS — E785 Hyperlipidemia, unspecified: Secondary | ICD-10-CM

## 2013-10-15 DIAGNOSIS — R918 Other nonspecific abnormal finding of lung field: Secondary | ICD-10-CM

## 2013-10-15 DIAGNOSIS — IMO0002 Reserved for concepts with insufficient information to code with codable children: Secondary | ICD-10-CM

## 2013-10-15 DIAGNOSIS — E559 Vitamin D deficiency, unspecified: Secondary | ICD-10-CM

## 2013-10-15 DIAGNOSIS — M10071 Idiopathic gout, right ankle and foot: Secondary | ICD-10-CM

## 2013-10-15 DIAGNOSIS — B20 Human immunodeficiency virus [HIV] disease: Secondary | ICD-10-CM

## 2013-10-15 LAB — POCT CBC
Granulocyte percent: 45 %G (ref 37–80)
HCT, POC: 43.6 % (ref 43.5–53.7)
Hemoglobin: 14.3 g/dL (ref 14.1–18.1)
Lymph, poc: 3.8 — AB (ref 0.6–3.4)
MCH, POC: 32.7 pg — AB (ref 27–31.2)
MCHC: 32.7 g/dL (ref 31.8–35.4)
MCV: 99.8 fL — AB (ref 80–97)
MPV: 6.5 fL (ref 0–99.8)
POC GRANULOCYTE: 3.7 (ref 2–6.9)
POC LYMPH %: 45.6 % (ref 10–50)
Platelet Count, POC: 171 10*3/uL (ref 142–424)
RBC: 4.4 M/uL — AB (ref 4.69–6.13)
RDW, POC: 13.3 %
WBC: 8.3 10*3/uL (ref 4.6–10.2)

## 2013-10-15 LAB — POCT GLYCOSYLATED HEMOGLOBIN (HGB A1C): HEMOGLOBIN A1C: 6.4

## 2013-10-15 NOTE — Progress Notes (Signed)
PT CAME IN FOR LAB ONLY

## 2013-10-15 NOTE — Assessment & Plan Note (Signed)
The patient's chest x-ray that showed resolution of his infiltrate with only minimal residual scarring. The patient also has had resolution of his symptoms, and currently is symptom-free. He has not need further pulmonary workup, but I would be happy to see him again if he has worsening symptoms or changes radiographically again.

## 2013-10-15 NOTE — Progress Notes (Signed)
   Subjective:    Patient ID: Patrick Brown, male    DOB: 07/11/1970, 43 y.o.   MRN: [id number]  HPI Patient comes in today for followup of his known pulmonary infiltrate in a patient with HIV positive/AIDS. He is been treated appropriately, and his chest x-ray over time has slowly improved. His counts are now much better, and he is totally asymptomatic. He denies any significant cough, congestion, or purulence. His chest x-ray shows near complete resolution of his abnormal density, and he is left with only minimal residual scar.   Review of Systems  Constitutional: Negative for fever and unexpected weight change.  HENT: Negative for congestion, dental problem, ear pain, nosebleeds, postnasal drip, rhinorrhea, sinus pressure, sneezing, sore throat and trouble swallowing.   Eyes: Negative for redness and itching.  Respiratory: Negative for cough, chest tightness, shortness of breath and wheezing.   Cardiovascular: Negative for palpitations and leg swelling.  Gastrointestinal: Negative for nausea and vomiting.  Genitourinary: Negative for dysuria.  Musculoskeletal: Negative for joint swelling.  Skin: Negative for rash.  Neurological: Negative for headaches.  Hematological: Does not bruise/bleed easily.  Psychiatric/Behavioral: Negative for dysphoric mood. The patient is not nervous/anxious.        Objective:   Physical Exam Overweight male in no acute distress Nose without purulence or discharge noted Neck without lymphadenopathy or thyromegaly Chest totally clear to auscultation, no wheezing Exam with regular rate and rhythm Hour extremities without edema, no cyanosis Alert and oriented, moves all 4 extremities.       Assessment & Plan:

## 2013-10-15 NOTE — Patient Instructions (Signed)
Your chest xray is much improved.  No further folllowup needed unless a recurrence of your symptoms.

## 2013-10-16 LAB — BMP8+EGFR
BUN/Creatinine Ratio: 17 (ref 9–20)
BUN: 32 mg/dL — ABNORMAL HIGH (ref 6–24)
CALCIUM: 9.5 mg/dL (ref 8.7–10.2)
CO2: 25 mmol/L (ref 18–29)
CREATININE: 1.88 mg/dL — AB (ref 0.76–1.27)
Chloride: 92 mmol/L — ABNORMAL LOW (ref 97–108)
GFR calc Af Amer: 49 mL/min/{1.73_m2} — ABNORMAL LOW (ref >59)
GFR calc non Af Amer: 42 mL/min/{1.73_m2} — ABNORMAL LOW (ref >59)
GLUCOSE: 292 mg/dL — AB (ref 65–99)
POTASSIUM: 4.3 mmol/L (ref 3.5–5.2)
Sodium: 133 mmol/L — ABNORMAL LOW (ref 134–144)

## 2013-10-16 LAB — HEPATIC FUNCTION PANEL
ALT: 69 IU/L — ABNORMAL HIGH (ref 0–44)
AST: 51 IU/L — ABNORMAL HIGH (ref 0–40)
Albumin: 4.2 g/dL (ref 3.5–5.5)
Alkaline Phosphatase: 100 IU/L (ref 39–117)
BILIRUBIN DIRECT: 0.11 mg/dL (ref 0.00–0.40)
BILIRUBIN TOTAL: 0.3 mg/dL (ref 0.0–1.2)
TOTAL PROTEIN: 7.1 g/dL (ref 6.0–8.5)

## 2013-10-16 LAB — NMR, LIPOPROFILE
CHOLESTEROL: 163 mg/dL (ref 100–199)
HDL CHOLESTEROL BY NMR: 55 mg/dL (ref >39)
HDL Particle Number: 33.7 umol/L (ref >=30.5)
LDL PARTICLE NUMBER: 779 nmol/L (ref <1000)
LDL SIZE: 20.8 nm (ref >20.5)
LDLC SERPL CALC-MCNC: 64 mg/dL (ref 0–99)
LP-IR Score: 56 — ABNORMAL HIGH (ref <=45)
Small LDL Particle Number: 316 nmol/L (ref <=527)
TRIGLYCERIDES BY NMR: 221 mg/dL — AB (ref 0–149)

## 2013-10-16 LAB — VITAMIN D 25 HYDROXY (VIT D DEFICIENCY, FRACTURES): Vit D, 25-Hydroxy: 43.1 ng/mL (ref 30.0–100.0)

## 2013-10-16 LAB — URIC ACID: Uric Acid: 8.7 mg/dL — ABNORMAL HIGH (ref 3.7–8.6)

## 2013-10-18 ENCOUNTER — Ambulatory Visit: Payer: BC Managed Care – PPO | Admitting: Pulmonary Disease

## 2013-10-20 ENCOUNTER — Telehealth: Payer: Self-pay | Admitting: Family Medicine

## 2013-10-20 NOTE — Telephone Encounter (Signed)
Message copied by Cline Crock on Wed Oct 20, 2013  9:48 AM ------      Message from: Chipper Herb      Created: Sat Oct 16, 2013 10:45 AM       Blood sugar is elevated at 292. The creatinine remains elevated at 1.88. The sodium and chloride are slightly decreased. The potassium is  within normal limits at 4.3      2 liver function tests remain somewhat elevated and these elevations are consistent with past readings.      With advanced lipid testing, the total LDL particle number was good at 779. The LDL C. is good at 64. The triglycerides are elevated at 221. The good cholesterol or the HDL particle number is improved. The triglyceride elevation most likely reflects the lack of good blood sugar control and diet habits which can be improved. Please tell the patient to be persistent at exercise and proper eating      The vitamin D level was good at 43.1, continue current treatment      The uric acid remains elevated at 8.7. This is consistent with the most recent past readings.      Please make sure that a copy of this lab report is sent to his nephrologist in [place]       ------

## 2013-10-22 ENCOUNTER — Telehealth: Payer: Self-pay | Admitting: *Deleted

## 2013-10-22 NOTE — Telephone Encounter (Signed)
Patient called c/o increasing depression and wanted to know if this could be caused by his HIV meds. He states he has a history of depression and is currently on effexor which is prescribed by Dr. Laurance Flatten. Advised patient to speak with his PCP because he is the one prescribing the effexor. Also informed him that we have counseling here if he is interested. He states he will speak to Dr. Laurance Flatten and think about counseling. Myrtis Hopping

## 2013-10-25 ENCOUNTER — Other Ambulatory Visit: Payer: Self-pay | Admitting: Family Medicine

## 2013-10-26 NOTE — Telephone Encounter (Signed)
Pt stated that he feels depressed again He feels that he has no purpose here and all he wants to do is sleep. No suicidal thoughts  On effexor now Please advise and i will call pt back.

## 2013-10-26 NOTE — Telephone Encounter (Signed)
We may need to increase the Effexor to a higher strength, i.e. 75 mg once daily

## 2013-10-27 ENCOUNTER — Other Ambulatory Visit: Payer: Self-pay | Admitting: Family Medicine

## 2013-10-27 NOTE — Telephone Encounter (Signed)
We need to ask pt if he wants to increase dose - if he does - we will need to order. [id number]

## 2013-11-02 MED ORDER — VENLAFAXINE HCL ER 37.5 MG PO CP24: ORAL_CAPSULE | ORAL | Status: DC

## 2013-11-02 NOTE — Telephone Encounter (Signed)
Pt aware and new rx sent in with new directions

## 2013-11-02 NOTE — Telephone Encounter (Signed)
Increase this to 2 twice daily

## 2013-11-02 NOTE — Telephone Encounter (Signed)
Pt already taking 37.5 - 3 a day Would you want to increase the more- or to something else? Please address    Nurse call cell #

## 2013-11-08 ENCOUNTER — Other Ambulatory Visit: Payer: Self-pay | Admitting: Family Medicine

## 2013-11-10 ENCOUNTER — Other Ambulatory Visit: Payer: Self-pay | Admitting: Nurse Practitioner

## 2013-11-15 ENCOUNTER — Other Ambulatory Visit (INDEPENDENT_AMBULATORY_CARE_PROVIDER_SITE_OTHER): Payer: BC Managed Care – PPO

## 2013-11-15 DIAGNOSIS — Z23 Encounter for immunization: Secondary | ICD-10-CM

## 2013-11-15 DIAGNOSIS — E875 Hyperkalemia: Secondary | ICD-10-CM

## 2013-11-16 LAB — CBC WITH DIFFERENTIAL
BASOS ABS: 0.1 10*3/uL (ref 0.0–0.2)
Basos: 1 %
EOS ABS: 0.4 10*3/uL (ref 0.0–0.4)
Eos: 4 %
HCT: 42.2 % (ref 37.5–51.0)
Hemoglobin: 14.5 g/dL (ref 12.6–17.7)
IMMATURE GRANULOCYTES: 1 %
Immature Grans (Abs): 0.1 10*3/uL (ref 0.0–0.1)
Lymphocytes Absolute: 4.9 10*3/uL — ABNORMAL HIGH (ref 0.7–3.1)
Lymphs: 46 %
MCH: 32.9 pg (ref 26.6–33.0)
MCHC: 34.4 g/dL (ref 31.5–35.7)
MCV: 96 fL (ref 79–97)
MONOCYTES: 10 %
MONOS ABS: 1 10*3/uL — AB (ref 0.1–0.9)
NEUTROS ABS: 4.1 10*3/uL (ref 1.4–7.0)
Neutrophils Relative %: 38 %
PLATELETS: 185 10*3/uL (ref 150–379)
RBC: 4.41 x10E6/uL (ref 4.14–5.80)
RDW: 13.7 % (ref 12.3–15.4)
WBC: 10.6 10*3/uL (ref 3.4–10.8)

## 2013-11-16 LAB — BMP8+EGFR
BUN/Creatinine Ratio: 18 (ref 9–20)
BUN: 36 mg/dL — ABNORMAL HIGH (ref 6–24)
CO2: 24 mmol/L (ref 18–29)
Calcium: 9.9 mg/dL (ref 8.7–10.2)
Chloride: 97 mmol/L (ref 97–108)
Creatinine, Ser: 2.02 mg/dL — ABNORMAL HIGH (ref 0.76–1.27)
GFR, EST AFRICAN AMERICAN: 45 mL/min/{1.73_m2} — AB (ref >59)
GFR, EST NON AFRICAN AMERICAN: 39 mL/min/{1.73_m2} — AB (ref >59)
GLUCOSE: 211 mg/dL — AB (ref 65–99)
POTASSIUM: 4.5 mmol/L (ref 3.5–5.2)
SODIUM: 136 mmol/L (ref 134–144)

## 2013-11-23 ENCOUNTER — Other Ambulatory Visit: Payer: Self-pay | Admitting: *Deleted

## 2013-11-23 DIAGNOSIS — B20 Human immunodeficiency virus [HIV] disease: Secondary | ICD-10-CM

## 2013-11-23 MED ORDER — ABACAVIR SULFATE-LAMIVUDINE 600-300 MG PO TABS: ORAL_TABLET | ORAL | Status: DC

## 2013-11-23 MED ORDER — DOLUTEGRAVIR SODIUM 50 MG PO TABS: ORAL_TABLET | ORAL | Status: DC

## 2013-11-29 ENCOUNTER — Other Ambulatory Visit: Payer: Self-pay | Admitting: Family Medicine

## 2013-11-30 ENCOUNTER — Other Ambulatory Visit: Payer: Self-pay | Admitting: Family Medicine

## 2013-12-15 ENCOUNTER — Other Ambulatory Visit: Payer: Self-pay | Admitting: Family Medicine

## 2013-12-16 ENCOUNTER — Other Ambulatory Visit: Payer: Self-pay | Admitting: Family Medicine

## 2013-12-17 ENCOUNTER — Other Ambulatory Visit (INDEPENDENT_AMBULATORY_CARE_PROVIDER_SITE_OTHER): Payer: BC Managed Care – PPO

## 2013-12-17 DIAGNOSIS — D509 Iron deficiency anemia, unspecified: Secondary | ICD-10-CM

## 2013-12-17 NOTE — Progress Notes (Signed)
Lab only 

## 2013-12-18 LAB — BMP8+EGFR
BUN / CREAT RATIO: 16 (ref 9–20)
BUN: 30 mg/dL — ABNORMAL HIGH (ref 6–24)
CO2: 25 mmol/L (ref 18–29)
Calcium: 9.9 mg/dL (ref 8.7–10.2)
Chloride: 95 mmol/L — ABNORMAL LOW (ref 97–108)
Creatinine, Ser: 1.85 mg/dL — ABNORMAL HIGH (ref 0.76–1.27)
GFR calc non Af Amer: 43 mL/min/{1.73_m2} — ABNORMAL LOW (ref >59)
GFR, EST AFRICAN AMERICAN: 50 mL/min/{1.73_m2} — AB (ref >59)
Glucose: 230 mg/dL — ABNORMAL HIGH (ref 65–99)
Potassium: 4.9 mmol/L (ref 3.5–5.2)
SODIUM: 135 mmol/L (ref 134–144)

## 2013-12-18 LAB — CBC WITH DIFFERENTIAL
BASOS: 1 %
Basophils Absolute: 0.1 10*3/uL (ref 0.0–0.2)
EOS ABS: 0.4 10*3/uL (ref 0.0–0.4)
Eos: 5 %
HEMATOCRIT: 40.3 % (ref 37.5–51.0)
HEMOGLOBIN: 14.2 g/dL (ref 12.6–17.7)
Immature Grans (Abs): 0.1 10*3/uL (ref 0.0–0.1)
Immature Granulocytes: 1 %
Lymphocytes Absolute: 5 10*3/uL — ABNORMAL HIGH (ref 0.7–3.1)
Lymphs: 53 %
MCH: 32.9 pg (ref 26.6–33.0)
MCHC: 35.2 g/dL (ref 31.5–35.7)
MCV: 93 fL (ref 79–97)
MONOS ABS: 0.9 10*3/uL (ref 0.1–0.9)
Monocytes: 9 %
NEUTROS ABS: 3 10*3/uL (ref 1.4–7.0)
Neutrophils Relative %: 31 %
Platelets: 174 10*3/uL (ref 150–379)
RBC: 4.32 x10E6/uL (ref 4.14–5.80)
RDW: 14.4 % (ref 12.3–15.4)
WBC: 9.4 10*3/uL (ref 3.4–10.8)

## 2013-12-20 ENCOUNTER — Ambulatory Visit: Payer: BC Managed Care – PPO | Admitting: Internal Medicine

## 2013-12-21 ENCOUNTER — Ambulatory Visit: Payer: BC Managed Care – PPO | Admitting: Internal Medicine

## 2013-12-23 ENCOUNTER — Ambulatory Visit (INDEPENDENT_AMBULATORY_CARE_PROVIDER_SITE_OTHER): Payer: BC Managed Care – PPO | Admitting: Internal Medicine

## 2013-12-23 ENCOUNTER — Encounter: Payer: Self-pay | Admitting: Internal Medicine

## 2013-12-23 VITALS — BP 118/80 | HR 103 | Temp 98.2°F | Ht 70.0 in | Wt 216.5 lb

## 2013-12-23 DIAGNOSIS — Z23 Encounter for immunization: Secondary | ICD-10-CM

## 2013-12-23 DIAGNOSIS — B20 Human immunodeficiency virus [HIV] disease: Secondary | ICD-10-CM

## 2013-12-23 DIAGNOSIS — Z113 Encounter for screening for infections with a predominantly sexual mode of transmission: Secondary | ICD-10-CM | POA: Diagnosis not present

## 2013-12-23 DIAGNOSIS — Z0000 Encounter for general adult medical examination without abnormal findings: Secondary | ICD-10-CM | POA: Insufficient documentation

## 2013-12-23 NOTE — Assessment & Plan Note (Signed)
Doing well.  Labs today and rtc 4 months unless concerns 

## 2013-12-23 NOTE — Progress Notes (Signed)
  Subjective:    Patient ID: Patrick Brown, male    DOB: 18-Dec-1970, 43 y.o.   MRN: [id number]  HPI Is in for followup of HIV.  He continues on [place] and Epzicom. He is also on dapsone for PCP prophylaxis and azithromycin for MAC prophylaxis. He feels much better and his skin seems to be clearing up some, though still has puritis.   His pulmonary symptoms though have cleared. He denies any missed doses of his medications.  Last CD4 was 300.     Review of Systems  Constitutional: Negative for fever.  HENT: Negative for sore throat.   Eyes: Negative for visual disturbance.  Respiratory: Negative for shortness of breath.   Gastrointestinal: Negative for nausea, abdominal pain and diarrhea.  Skin:       Improved rash  Neurological: Negative for dizziness.       Objective:   Physical Exam  Constitutional: He appears well-developed and well-nourished. No distress.  HENT:  Mouth/Throat: No oropharyngeal exudate.  Eyes: No scleral icterus.  Cardiovascular: Normal rate, regular rhythm and normal heart sounds.   No murmur heard. Pulmonary/Chest: Effort normal and breath sounds normal. No respiratory distress.  Lymphadenopathy:    He has no cervical adenopathy.  Skin: No rash noted.          Assessment & Plan:

## 2013-12-24 LAB — HIV-1 RNA QUANT-NO REFLEX-BLD
HIV 1 RNA Quant: <20 copies/mL (ref <20)
HIV-1 RNA Quant, Log: <1.3 {Log} (ref <1.30)

## 2013-12-24 LAB — RPR: RPR: REACTIVE — AB

## 2013-12-24 LAB — FLUORESCENT TREPONEMAL AB(FTA)-IGG-BLD: Fluorescent Treponemal ABS: REACTIVE — AB

## 2013-12-24 LAB — RPR TITER: RPR Titer: 1:2 {titer}

## 2013-12-24 LAB — T-HELPER CELL (CD4) - (RCID CLINIC ONLY)
CD4 % Helper T Cell: 8 % — ABNORMAL LOW (ref 33–55)
CD4 T CELL ABS: 310 /uL — AB (ref 400–2700)

## 2013-12-27 ENCOUNTER — Other Ambulatory Visit: Payer: Self-pay | Admitting: Family Medicine

## 2014-01-07 ENCOUNTER — Other Ambulatory Visit: Payer: Self-pay | Admitting: Family Medicine

## 2014-01-14 ENCOUNTER — Other Ambulatory Visit (INDEPENDENT_AMBULATORY_CARE_PROVIDER_SITE_OTHER): Payer: BC Managed Care – PPO

## 2014-01-14 DIAGNOSIS — E875 Hyperkalemia: Secondary | ICD-10-CM

## 2014-01-14 NOTE — Progress Notes (Signed)
Labs only

## 2014-01-15 LAB — ANEMIA PROFILE B
BASOS: 1 %
Basophils Absolute: 0.1 10*3/uL (ref 0.0–0.2)
EOS: 6 %
Eosinophils Absolute: 0.6 10*3/uL — ABNORMAL HIGH (ref 0.0–0.4)
Ferritin: 589 ng/mL — ABNORMAL HIGH (ref 30–400)
Folate: 18.3 ng/mL (ref >3.0)
HCT: 38.2 % (ref 37.5–51.0)
Hemoglobin: 13.1 g/dL (ref 12.6–17.7)
IRON SATURATION: 28 % (ref 15–55)
IRON: 64 ug/dL (ref 40–155)
Immature Grans (Abs): 0 10*3/uL (ref 0.0–0.1)
Immature Granulocytes: 0 %
LYMPHS ABS: 4.1 10*3/uL — AB (ref 0.7–3.1)
Lymphs: 40 %
MCH: 33.1 pg — AB (ref 26.6–33.0)
MCHC: 34.3 g/dL (ref 31.5–35.7)
MCV: 97 fL (ref 79–97)
Monocytes Absolute: 1.3 10*3/uL — ABNORMAL HIGH (ref 0.1–0.9)
Monocytes: 13 %
Neutrophils Absolute: 4.3 10*3/uL (ref 1.4–7.0)
Neutrophils Relative %: 40 %
Platelets: 149 10*3/uL — ABNORMAL LOW (ref 150–379)
RBC: 3.96 x10E6/uL — ABNORMAL LOW (ref 4.14–5.80)
RDW: 14.1 % (ref 12.3–15.4)
Retic Ct Pct: 2.2 % (ref 0.6–2.6)
TIBC: 227 ug/dL — ABNORMAL LOW (ref 250–450)
UIBC: 163 ug/dL (ref 150–375)
VITAMIN B 12: 536 pg/mL (ref 211–946)
WBC: 10.3 10*3/uL (ref 3.4–10.8)

## 2014-01-15 LAB — BMP8+EGFR
BUN/Creatinine Ratio: 15 (ref 9–20)
BUN: 27 mg/dL — ABNORMAL HIGH (ref 6–24)
CO2: 24 mmol/L (ref 18–29)
CREATININE: 1.79 mg/dL — AB (ref 0.76–1.27)
Calcium: 9.4 mg/dL (ref 8.7–10.2)
Chloride: 97 mmol/L (ref 97–108)
GFR calc Af Amer: 52 mL/min/{1.73_m2} — ABNORMAL LOW (ref >59)
GFR, EST NON AFRICAN AMERICAN: 45 mL/min/{1.73_m2} — AB (ref >59)
Glucose: 247 mg/dL — ABNORMAL HIGH (ref 65–99)
Potassium: 4 mmol/L (ref 3.5–5.2)
SODIUM: 135 mmol/L (ref 134–144)

## 2014-01-17 ENCOUNTER — Telehealth: Payer: Self-pay | Admitting: *Deleted

## 2014-01-17 NOTE — Telephone Encounter (Signed)
Aware of results. 

## 2014-01-24 ENCOUNTER — Ambulatory Visit: Payer: BC Managed Care – PPO

## 2014-01-30 ENCOUNTER — Other Ambulatory Visit: Payer: Self-pay | Admitting: Family Medicine

## 2014-02-01 ENCOUNTER — Other Ambulatory Visit: Payer: Self-pay | Admitting: Family Medicine

## 2014-02-01 ENCOUNTER — Ambulatory Visit (INDEPENDENT_AMBULATORY_CARE_PROVIDER_SITE_OTHER): Payer: BC Managed Care – PPO | Admitting: *Deleted

## 2014-02-01 DIAGNOSIS — Z23 Encounter for immunization: Secondary | ICD-10-CM

## 2014-02-01 DIAGNOSIS — B20 Human immunodeficiency virus [HIV] disease: Secondary | ICD-10-CM | POA: Diagnosis not present

## 2014-02-01 LAB — HM DIABETES EYE EXAM

## 2014-02-02 ENCOUNTER — Encounter: Payer: Self-pay | Admitting: *Deleted

## 2014-02-14 ENCOUNTER — Other Ambulatory Visit: Payer: Self-pay | Admitting: Family Medicine

## 2014-02-15 ENCOUNTER — Telehealth: Payer: Self-pay | Admitting: *Deleted

## 2014-02-15 ENCOUNTER — Other Ambulatory Visit: Payer: Self-pay | Admitting: *Deleted

## 2014-02-15 DIAGNOSIS — B20 Human immunodeficiency virus [HIV] disease: Secondary | ICD-10-CM

## 2014-02-15 MED ORDER — ABACAVIR SULFATE-LAMIVUDINE 600-300 MG PO TABS: ORAL_TABLET | ORAL | Status: DC

## 2014-02-15 MED ORDER — DOLUTEGRAVIR SODIUM 50 MG PO TABS: ORAL_TABLET | ORAL | Status: DC

## 2014-02-15 NOTE — Telephone Encounter (Signed)
Received a call from [place].  He needed an emergency supply of his Tivicay and Epzicom.  His insurance will be effective 03-07-14.  I called [facility] and got his medication.

## 2014-02-22 ENCOUNTER — Ambulatory Visit: Payer: BC Managed Care – PPO | Admitting: Family Medicine

## 2014-02-25 ENCOUNTER — Other Ambulatory Visit: Payer: Self-pay | Admitting: Family Medicine

## 2014-03-07 ENCOUNTER — Other Ambulatory Visit: Payer: Self-pay | Admitting: Family Medicine

## 2014-03-11 ENCOUNTER — Ambulatory Visit (INDEPENDENT_AMBULATORY_CARE_PROVIDER_SITE_OTHER): Payer: BLUE CROSS/BLUE SHIELD | Admitting: Family Medicine

## 2014-03-11 ENCOUNTER — Encounter: Payer: Self-pay | Admitting: Family Medicine

## 2014-03-11 VITALS — BP 134/85 | HR 85 | Temp 97.4°F | Ht 70.0 in | Wt 213.0 lb

## 2014-03-11 DIAGNOSIS — B20 Human immunodeficiency virus [HIV] disease: Secondary | ICD-10-CM

## 2014-03-11 DIAGNOSIS — N183 Chronic kidney disease, stage 3 unspecified: Secondary | ICD-10-CM

## 2014-03-11 DIAGNOSIS — IMO0002 Reserved for concepts with insufficient information to code with codable children: Secondary | ICD-10-CM

## 2014-03-11 DIAGNOSIS — E1065 Type 1 diabetes mellitus with hyperglycemia: Secondary | ICD-10-CM

## 2014-03-11 DIAGNOSIS — E349 Endocrine disorder, unspecified: Secondary | ICD-10-CM

## 2014-03-11 DIAGNOSIS — E785 Hyperlipidemia, unspecified: Secondary | ICD-10-CM

## 2014-03-11 DIAGNOSIS — Z0000 Encounter for general adult medical examination without abnormal findings: Secondary | ICD-10-CM

## 2014-03-11 DIAGNOSIS — E559 Vitamin D deficiency, unspecified: Secondary | ICD-10-CM

## 2014-03-11 DIAGNOSIS — E039 Hypothyroidism, unspecified: Secondary | ICD-10-CM

## 2014-03-11 LAB — POCT UA - MICROSCOPIC ONLY
Bacteria, U Microscopic: NEGATIVE
CASTS, UR, LPF, POC: NEGATIVE
Crystals, Ur, HPF, POC: NEGATIVE
RBC, URINE, MICROSCOPIC: NEGATIVE
WBC, UR, HPF, POC: NEGATIVE
Yeast, UA: NEGATIVE

## 2014-03-11 LAB — POCT URINALYSIS DIPSTICK
Bilirubin, UA: NEGATIVE
Blood, UA: NEGATIVE
Glucose, UA: 250
KETONES UA: NEGATIVE
Leukocytes, UA: NEGATIVE
NITRITE UA: NEGATIVE
PH UA: 5
Spec Grav, UA: 1.015
UROBILINOGEN UA: NEGATIVE

## 2014-03-11 NOTE — Progress Notes (Signed)
Subjective:    Patient ID: Patrick Brown, male    DOB: Jul 11, 1970, 44 y.o.   MRN: [id number]  HPI Pt here for annual exam and to follow up and manage chronic medical problems which include diabetes, hypothyroid and hyperlipidemia. He is taking medications regularly. The patient has been doing well since the last visit. He has periodic bouts of increased cough and congestion and takes Augmentin for this. He is on a course of this now and is feeling better with his sinus infection. He is followed by a nephrologist in [place] and by infectious disease in [place]. The patient also sees an endocrinologist in [facility] and this visit is currently monthly. His visit with the nephrologist's every 6 months and his visit with the infectious disease doctor is currently every 6 months. His blood sugars at home have been running good at around 150. He does not check his blood pressures.         Patient Active Problem List   Diagnosis Date Noted  . Screening examination for venereal disease 12/23/2013  . Pain in joint, lower leg 11/19/2012  . Unspecified sinusitis (chronic) 09/16/2012  . Pneumocystis carinii pneumonia 08/19/2012  . Itch 08/17/2012  . Hypercalcemia 08/09/2012  . Right groin wound 08/09/2012  . Hyperkalemia 08/07/2012  . Acute respiratory failure with hypoxia 08/05/2012  . Hyponatremia 08/05/2012  . HCAP (healthcare-associated pneumonia) 08/05/2012  . SOB (shortness of breath) 07/14/2012  . HIV disease 06/02/2012  . Convulsions/seizures 05/16/2012  . SIRS (systemic inflammatory response syndrome) 04/18/2012  . Pulmonary infiltrate 04/18/2012  . Gastroparesis 04/18/2012  . Sinus tachycardia 04/18/2012  . Near syncope 04/16/2012  . Anemia, iron deficiency   . DM (diabetes mellitus), type 1, uncontrolled 05/08/2011  . Hypothyroid 05/08/2011  . Chronic kidney disease, stage 3, mod decreased GFR 12/31/2010   Outpatient Encounter Prescriptions as of 03/11/2014    Medication Sig  . abacavir-lamiVUDine (EPZICOM) 600-300 MG per tablet TAKE ONE TABLET (600MG-300MG) BY MOUTH ONCE DAILY AT BEDTIME. STORE ATCONTROLLED ROOM TEMPERATURE.  Marland Kitchen acetaminophen (TYLENOL) 500 MG tablet Take 1,000 mg by mouth 2 (two) times daily.   Marland Kitchen ampicillin (PRINCIPEN) 500 MG capsule 1 tablet twice daily  . ANDROGEL PUMP 20.25 MG/ACT (1.62%) GEL APPLY 2 PUMPS ONCE A DAY AS DIRECTED  . aspirin 81 MG chewable tablet Chew 81 mg by mouth every morning.  . colchicine 0.6 MG tablet Take 0.6 mg by mouth as needed.  . cyclobenzaprine (FLEXERIL) 10 MG tablet Take 1 tablet (10 mg total) by mouth 3 (three) times daily as needed for muscle spasms.  . dolutegravir (TIVICAY) 50 MG tablet TAKE ONE TABLET (50MG) BY MOUTH ONCE DAILY AT BEDTIME. STORE AT CONTROLLED ROOM TEMPERATURE.  . ferrous sulfate 325 (65 FE) MG tablet Take 650 mg by mouth daily with breakfast.   . fludrocortisone (FLORINEF) 0.67m/mL SUSP Take 0.2 mg by mouth daily.   . furosemide (LASIX) 20 MG tablet Take 20 mg by mouth 2 (two) times daily.  . Glucosamine-Chondroit-Vit C-Mn (GLUCOSAMINE 1500 COMPLEX PO) Take 1 tablet by mouth 2 (two) times daily.   . hydrOXYzine (ATARAX/VISTARIL) 25 MG tablet TAKE 1 TABLET (25 MG TOTAL) BY MOUTH EVERY 6 (SIX) HOURS AS NEEDED FOR ITCHING.  .Marland Kitchenlevothyroxine (SYNTHROID, LEVOTHROID) 200 MCG tablet TAKE 1 TABLET (200 MCG TOTAL) BY MOUTH DAILY.  .Marland Kitchenloratadine (CLARITIN) 10 MG tablet Take 10 mg by mouth daily as needed for allergies.  .Marland KitchenLORazepam (ATIVAN) 0.5 MG tablet TAKE 1 TABLET BY MOUTH EVERY DAY AS  NEEDED  . metoCLOPramide (REGLAN) 5 MG tablet TAKE 1 TABLET BY MOUTH 3 TIMES A DAY BEFORE MEALS  . NEXIUM 40 MG capsule TAKE ONE CAPSULE BY MOUTH DAILY  . niacin (NIASPAN) 500 MG CR tablet TAKE 3 TABLETS BY MOUTH EVERY DAY  . NOVOLOG 100 UNIT/ML injection USE PER PUMP AS DIRECTED  . omega-3 acid ethyl esters (LOVAZA) 1 G capsule TAKE 2 CAPSULES (2 G TOTAL) BY MOUTH 2 (TWO) TIMES DAILY.  Marland Kitchen ondansetron  (ZOFRAN) 4 MG tablet Take 1 tablet (4 mg total) by mouth every 8 (eight) hours as needed for nausea.  Marland Kitchen OVER THE COUNTER MEDICATION Take 1 capsule by mouth daily. [name]- probiotic daily  . simvastatin (ZOCOR) 40 MG tablet TAKE 1 TABLET (40 MG TOTAL) BY MOUTH AT BEDTIME.  . sodium bicarbonate 650 MG tablet Take 650 mg by mouth every morning.   . traMADol (ULTRAM) 50 MG tablet Take 1 tablet (50 mg total) by mouth every 6 (six) hours as needed for pain.  Marland Kitchen ULORIC 40 MG tablet TAKE 1 TABLET BY MOUTH EVERY DAY  . valACYclovir (VALTREX) 500 MG tablet Take 500 mg by mouth 2 (two) times daily.  Marland Kitchen venlafaxine XR (EFFEXOR-XR) 37.5 MG 24 hr capsule TAKE 2 CAPSULES BY MOUTH TWICE A DAY  . [DISCONTINUED] fluconazole (DIFLUCAN) 200 MG tablet TAKE 1 TABLET (200 MG TOTAL) BY MOUTH DAILY.  . [DISCONTINUED] amoxicillin-clavulanate (AUGMENTIN) 875-125 MG per tablet TAKE 1 TABLET BY MOUTH 2 (TWO) TIMES DAILY.    Review of Systems  Constitutional: Negative.   HENT: Negative.   Eyes: Negative.   Respiratory: Negative.   Cardiovascular: Negative.   Gastrointestinal: Negative.   Endocrine: Negative.   Genitourinary: Negative.   Musculoskeletal: Negative.   Skin: Negative.   Allergic/Immunologic: Negative.   Neurological: Negative.   Hematological: Negative.   Psychiatric/Behavioral: Negative.        Objective:   Physical Exam  Constitutional: He is oriented to person, place, and time. He appears well-developed and well-nourished. No distress.  HENT:  Head: Normocephalic and atraumatic.  Right Ear: External ear normal.  Left Ear: External ear normal.  Nose: Nose normal.  Mouth/Throat: Oropharynx is clear and moist. No oropharyngeal exudate.  Eyes: Conjunctivae and EOM are normal. Pupils are equal, round, and reactive to light. Right eye exhibits no discharge. Left eye exhibits no discharge. No scleral icterus.  Neck: Normal range of motion. Neck supple. No thyromegaly present.  Cardiovascular:  Normal rate, regular rhythm, normal heart sounds and intact distal pulses.  Exam reveals no gallop and no friction rub.   No murmur heard. At 72/m  Pulmonary/Chest: Effort normal and breath sounds normal. No respiratory distress. He has no wheezes. He has no rales. He exhibits no tenderness.  Abdominal: Soft. Bowel sounds are normal. He exhibits no mass. There is no tenderness. There is no rebound and no guarding.  Genitourinary: Rectum normal and penis normal.  The prostate is slightly enlarged but soft and smooth. There are no rectal masses. The external genitalia were normal. There was no inguinal hernia and there were no inguinal nodes.  Musculoskeletal: Normal range of motion. He exhibits no edema or tenderness.  Lymphadenopathy:    He has no cervical adenopathy.  Neurological: He is alert and oriented to person, place, and time. He has normal reflexes. No cranial nerve deficit.  Skin: Skin is warm and dry. No rash noted. No erythema. No pallor.  With his HIV under better control the skin rashes or much improved from  the past.  Psychiatric: He has a normal mood and affect. His behavior is normal. Judgment and thought content normal.  Nursing note and vitals reviewed.  BP 134/85 mmHg  Pulse 85  Temp(Src) 97.4 F (36.3 C) (Oral)  Ht _0  (1.778 m)  Wt 213 lb (96.616 kg)  BMI 30.56 kg/m2        Assessment & Plan:  1. DM (diabetes mellitus), type 1, uncontrolled -The patient should continue monitoring his blood sugar at home regularly and following up with his endocrinologist - POCT CBC; Future - BMP8+EGFR; Future - POCT glycosylated hemoglobin (Hb A1C); Future  2. Chronic kidney disease, stage 3, mod decreased GFR -The patient should continue to follow-up with his nephrologist, Dr. Antionette Fairy - POCT CBC; Future - BMP8+EGFR; Future  3. Hypothyroidism, unspecified hypothyroidism type -Continue current treatment and any adjustments will be made following lab work results -  POCT CBC; Future  4. HIV disease -Continue follow-up with infectious disease, Dr. Linus Salmons - POCT CBC; Future - Hepatic function panel; Future  5. Vitamin D deficiency -Continue current treatment pending results of lab work and per direction of nephrology - POCT CBC; Future - Vit D  25 hydroxy (rtn osteoporosis monitoring); Future  6. Hyperlipidemia -Continue aggressive therapeutic lifestyle changes and simvastatin - POCT CBC; Future - NMR, lipoprofile; Future - Hepatic function panel; Future - Thyroid Panel With TSH; Future  7. Testosterone deficiency -Continue with AndroGel at current dose until lab work is returned and reviewed - POCT CBC; Future - PSA, total and free; Future - POCT UA - Microscopic Only - POCT urinalysis dipstick - Testosterone,Free and Total; Future  8. Annual physical exam - POCT CBC; Future - BMP8+EGFR; Future - NMR, lipoprofile; Future - PSA, total and free; Future - Hepatic function panel; Future - Thyroid Panel With TSH; Future - Vit D  25 hydroxy (rtn osteoporosis monitoring); Future - POCT UA - Microscopic Only - POCT urinalysis dipstick - POCT glycosylated hemoglobin (Hb A1C); Future  No orders of the defined types were placed in this encounter.   Arrie Senate MD

## 2014-03-11 NOTE — Patient Instructions (Addendum)
Continue current medications. Continue good therapeutic lifestyle changes which include good diet and exercise. Fall precautions discussed with patient. If an FOBT was given today- please return it to our front desk. If you are over 46 years old - you may need Prevnar 42 or the adult Pneumonia vaccine.  Flu Shots are still available at our office. If you still haven't had one please call to set up a nurse visit to get one.   After your visit with [place] today you will receive a survey in the mail or online from [employer] regarding your care with [place]. Please take a moment to fill this out. Your feedback is very important to [place] as you can help [place] better understand your patient needs as well as improve your experience and satisfaction. WE CARE ABOUT YOU!!!  Continue follow-up visits with endocrinology, nephrology, infectious disease, and ophthalmology We will arrange for you to have an appointment with the cardiologist because it has been a long time since you have been evaluated thoroughly for your heart and light of you having insulin-dependent diabetes.

## 2014-03-16 ENCOUNTER — Other Ambulatory Visit (INDEPENDENT_AMBULATORY_CARE_PROVIDER_SITE_OTHER): Payer: BLUE CROSS/BLUE SHIELD

## 2014-03-16 DIAGNOSIS — N183 Chronic kidney disease, stage 3 unspecified: Secondary | ICD-10-CM

## 2014-03-16 DIAGNOSIS — E559 Vitamin D deficiency, unspecified: Secondary | ICD-10-CM

## 2014-03-16 DIAGNOSIS — IMO0002 Reserved for concepts with insufficient information to code with codable children: Secondary | ICD-10-CM

## 2014-03-16 DIAGNOSIS — B20 Human immunodeficiency virus [HIV] disease: Secondary | ICD-10-CM

## 2014-03-16 DIAGNOSIS — Z0000 Encounter for general adult medical examination without abnormal findings: Secondary | ICD-10-CM

## 2014-03-16 DIAGNOSIS — E291 Testicular hypofunction: Secondary | ICD-10-CM

## 2014-03-16 DIAGNOSIS — E1065 Type 1 diabetes mellitus with hyperglycemia: Secondary | ICD-10-CM

## 2014-03-16 DIAGNOSIS — E785 Hyperlipidemia, unspecified: Secondary | ICD-10-CM

## 2014-03-16 DIAGNOSIS — E039 Hypothyroidism, unspecified: Secondary | ICD-10-CM

## 2014-03-16 DIAGNOSIS — E349 Endocrine disorder, unspecified: Secondary | ICD-10-CM

## 2014-03-16 LAB — POCT CBC
Granulocyte percent: 42.7 %G (ref 37–80)
HCT, POC: 45.3 % (ref 43.5–53.7)
HEMOGLOBIN: 14.1 g/dL (ref 14.1–18.1)
Lymph, poc: 4.7 — AB (ref 0.6–3.4)
MCH: 30.5 pg (ref 27–31.2)
MCHC: 31.2 g/dL — AB (ref 31.8–35.4)
MCV: 97.8 fL — AB (ref 80–97)
MPV: 5.6 fL (ref 0–99.8)
PLATELET COUNT, POC: 155 10*3/uL (ref 142–424)
POC Granulocyte: 4.3 (ref 2–6.9)
POC LYMPH PERCENT: 47.3 %L (ref 10–50)
RBC: 4.6 M/uL — AB (ref 4.69–6.13)
RDW, POC: 13.1 %
WBC: 10 10*3/uL (ref 4.6–10.2)

## 2014-03-16 LAB — POCT GLYCOSYLATED HEMOGLOBIN (HGB A1C): HEMOGLOBIN A1C: 7.2

## 2014-03-16 NOTE — Progress Notes (Signed)
Lab only 

## 2014-03-18 LAB — NMR, LIPOPROFILE
Cholesterol: 143 mg/dL (ref 100–199)
HDL Cholesterol by NMR: 49 mg/dL (ref >39)
HDL Particle Number: 29.2 umol/L — ABNORMAL LOW (ref >=30.5)
LDL PARTICLE NUMBER: 679 nmol/L (ref <1000)
LDL SIZE: 20.7 nm (ref >20.5)
LDL-C: 61 mg/dL (ref 0–99)
LP-IR Score: 50 — ABNORMAL HIGH (ref <=45)
Small LDL Particle Number: 355 nmol/L (ref <=527)
TRIGLYCERIDES BY NMR: 163 mg/dL — AB (ref 0–149)

## 2014-03-18 LAB — VITAMIN D 25 HYDROXY (VIT D DEFICIENCY, FRACTURES): Vit D, 25-Hydroxy: 47.1 ng/mL (ref 30.0–100.0)

## 2014-03-18 LAB — THYROID PANEL WITH TSH
Free Thyroxine Index: 2.5 (ref 1.2–4.9)
T3 UPTAKE RATIO: 33 % (ref 24–39)
T4, Total: 7.7 ug/dL (ref 4.5–12.0)
TSH: 0.567 u[IU]/mL (ref 0.450–4.500)

## 2014-03-18 LAB — HEPATIC FUNCTION PANEL
ALBUMIN: 4.1 g/dL (ref 3.5–5.5)
ALK PHOS: 81 IU/L (ref 39–117)
ALT: 71 IU/L — AB (ref 0–44)
AST: 64 IU/L — AB (ref 0–40)
Bilirubin Total: 0.4 mg/dL (ref 0.0–1.2)
Bilirubin, Direct: 0.14 mg/dL (ref 0.00–0.40)
Total Protein: 7.6 g/dL (ref 6.0–8.5)

## 2014-03-18 LAB — BMP8+EGFR
BUN/Creatinine Ratio: 19 (ref 9–20)
BUN: 39 mg/dL — AB (ref 6–24)
CALCIUM: 9.6 mg/dL (ref 8.7–10.2)
CO2: 22 mmol/L (ref 18–29)
CREATININE: 2.03 mg/dL — AB (ref 0.76–1.27)
Chloride: 93 mmol/L — ABNORMAL LOW (ref 97–108)
GFR calc Af Amer: 44 mL/min/{1.73_m2} — ABNORMAL LOW (ref >59)
GFR calc non Af Amer: 38 mL/min/{1.73_m2} — ABNORMAL LOW (ref >59)
Glucose: 322 mg/dL — ABNORMAL HIGH (ref 65–99)
Potassium: 4.6 mmol/L (ref 3.5–5.2)
SODIUM: 133 mmol/L — AB (ref 134–144)

## 2014-03-18 LAB — PSA, TOTAL AND FREE
PSA, Free Pct: 23.3 %
PSA, Free: 0.14 ng/mL
PSA: 0.6 ng/mL (ref 0.0–4.0)

## 2014-03-18 LAB — TESTOSTERONE,FREE AND TOTAL
Testosterone, Free: 6.9 pg/mL (ref 6.8–21.5)
Testosterone: 466 ng/dL (ref 348–1197)

## 2014-03-21 ENCOUNTER — Telehealth: Payer: Self-pay | Admitting: *Deleted

## 2014-03-21 NOTE — Telephone Encounter (Signed)
Pt notified of results Verbalizes understanding Copy forwarded to Dr Lorrene Reid

## 2014-03-21 NOTE — Telephone Encounter (Signed)
-----   Message from Chipper Herb, MD sent at 03/18/2014  9:35 PM EST ----- Please make sure that the patient's nephrologist gets a copy of this report  The blood sugar is elevated at 322. The creatinine, the most important kidney function test is elevated at 2.03 and this is higher than it has been over the past 5 months. The sodium and chloride are slightly decreased and the potassium is good.----The patient must try to do better with his blood sugar control. Cholesterol numbers with advanced lipid testing have a total LDL particle number that remains good at 679. The LDL C is good at 61. The triglycerides are slightly elevated at 163.----- the patient should continue with his current medication for cholesterol. Also aggressive therapeutic lifestyle changes. The PSA remains low and within normal limits. All liver function tests are within normal limits except to our elevated and these are consistent with past readings that is slightly more elevated than 5 months ago. All thyroid function tests are within normal limits------ the patient should continue with his current thyroid medication The vitamin D level remains good. The total testosterone and the free direct testosterone levels are within normal limits and the patient should continue with his current testosterone treatment.

## 2014-03-25 ENCOUNTER — Other Ambulatory Visit: Payer: Self-pay | Admitting: Family Medicine

## 2014-03-25 ENCOUNTER — Ambulatory Visit: Payer: Self-pay | Admitting: Cardiology

## 2014-03-28 NOTE — Telephone Encounter (Signed)
This is okay to refill 1 

## 2014-03-28 NOTE — Telephone Encounter (Signed)
Last seen 03/11/14 DWM   If approved print  Route to nurse

## 2014-03-28 NOTE — Telephone Encounter (Signed)
Last seen 03/11/14 DWM  If approved route to nurse to call into [facility] [phone]

## 2014-03-31 ENCOUNTER — Other Ambulatory Visit: Payer: Self-pay | Admitting: Family Medicine

## 2014-04-01 ENCOUNTER — Other Ambulatory Visit: Payer: Self-pay | Admitting: Family Medicine

## 2014-04-04 ENCOUNTER — Other Ambulatory Visit: Payer: Self-pay | Admitting: Family Medicine

## 2014-04-20 ENCOUNTER — Other Ambulatory Visit (INDEPENDENT_AMBULATORY_CARE_PROVIDER_SITE_OTHER): Payer: BLUE CROSS/BLUE SHIELD

## 2014-04-20 ENCOUNTER — Encounter: Payer: Self-pay | Admitting: Cardiology

## 2014-04-20 ENCOUNTER — Ambulatory Visit (INDEPENDENT_AMBULATORY_CARE_PROVIDER_SITE_OTHER): Payer: BLUE CROSS/BLUE SHIELD | Admitting: Cardiology

## 2014-04-20 VITALS — BP 110/80 | HR 80 | Ht 70.0 in | Wt 215.0 lb

## 2014-04-20 DIAGNOSIS — R799 Abnormal finding of blood chemistry, unspecified: Secondary | ICD-10-CM

## 2014-04-20 DIAGNOSIS — R0602 Shortness of breath: Secondary | ICD-10-CM

## 2014-04-20 NOTE — Patient Instructions (Signed)
The current medical regimen is effective;  continue present plan and medications.  Your physician has requested that you have an exercise tolerance test. For further information please visit [email or web address]. Please also follow instruction sheet, as given.  Further follow up will be based on these results.  Thank you for choosing [facility]!!

## 2014-04-20 NOTE — Progress Notes (Signed)
HPI The patient presents for evaluation of multiple cardiovascular risk factors. I saw him a couple of years ago for presyncope.  He was found to have low cortisol.  I did go back and review these hospital records and an echocardiogram from that time. He's not had any acute problems.  The patient denies any new symptoms such as chest discomfort, neck or arm discomfort. There has been no new shortness of breath, PND or orthopnea. There have been no reported palpitations, presyncope or syncope.  He is not as active as he used to be.  He does not exercise.    Allergies  Allergen Reactions  . Sulfa Antibiotics Other (See Comments)    High potassium High potassium  . Ramipril Cough  . Versed [Midazolam] Other (See Comments)    "I don't wake up very good or clear it out of my system" "I don't wake up very good or clear it out of my system"  . Ramipril Other (See Comments) and Cough    Current Outpatient Prescriptions  Medication Sig Dispense Refill  . abacavir-lamiVUDine (EPZICOM) 600-300 MG per tablet TAKE ONE TABLET (600MG -300MG ) BY MOUTH ONCE DAILY AT BEDTIME. STORE ATCONTROLLED ROOM TEMPERATURE. 30 tablet 5  . acetaminophen (TYLENOL) 500 MG tablet Take 1,000 mg by mouth 2 (two) times daily.     . ANDROGEL PUMP 20.25 MG/ACT (1.62%) GEL APPLY 2 PUMPS ONCE A DAY AS DIRECTED 75 g 2  . aspirin 81 MG chewable tablet Chew 81 mg by mouth every morning.    . colchicine 0.6 MG tablet Take 0.6 mg by mouth as needed.    . dolutegravir (TIVICAY) 50 MG tablet TAKE ONE TABLET (50MG ) BY MOUTH ONCE DAILY AT BEDTIME. STORE AT CONTROLLED ROOM TEMPERATURE. 30 tablet 5  . ferrous sulfate 325 (65 FE) MG tablet Take 650 mg by mouth daily with breakfast.     . fludrocortisone (FLORINEF) 0.1mg /mL SUSP Take 0.2 mg by mouth daily.     . furosemide (LASIX) 20 MG tablet Take 20 mg by mouth 2 (two) times daily.    . Glucosamine-Chondroit-Vit C-Mn (GLUCOSAMINE 1500 COMPLEX PO) Take 1 tablet by mouth 2 (two) times  daily.     Marland Kitchen levothyroxine (SYNTHROID, LEVOTHROID) 200 MCG tablet TAKE 1 TABLET (200 MCG TOTAL) BY MOUTH DAILY. 30 tablet 1  . loratadine (CLARITIN) 10 MG tablet Take 10 mg by mouth daily as needed for allergies.    Marland Kitchen LORazepam (ATIVAN) 0.5 MG tablet TAKE 1 TABLET BY MOUTH EVERY DAY AS NEEDED 30 tablet 2  . metoCLOPramide (REGLAN) 5 MG tablet TAKE 1 TABLET BY MOUTH 3 TIMES A DAY BEFORE MEALS 90 tablet 5  . NEXIUM 40 MG capsule TAKE ONE CAPSULE BY MOUTH DAILY 30 capsule 4  . niacin (NIASPAN) 500 MG CR tablet TAKE 3 TABLETS BY MOUTH EVERY DAY 90 tablet 5  . NOVOLOG 100 UNIT/ML injection USE PER PUMP AS DIRECTED 60 mL 2  . omega-3 acid ethyl esters (LOVAZA) 1 G capsule TAKE 2 CAPSULES (2 G TOTAL) BY MOUTH 2 (TWO) TIMES DAILY. 120 capsule 2  . ondansetron (ZOFRAN) 4 MG tablet Take 1 tablet (4 mg total) by mouth every 8 (eight) hours as needed for nausea. 45 tablet 0  . OVER THE COUNTER MEDICATION Take 1 capsule by mouth daily. [name]- probiotic daily    . simvastatin (ZOCOR) 40 MG tablet TAKE 1 TABLET (40 MG TOTAL) BY MOUTH AT BEDTIME. 30 tablet 4  . sodium bicarbonate 650 MG tablet Take 650  mg by mouth every morning.     . traMADol (ULTRAM) 50 MG tablet TAKE 1 TABLET BY MOUTH EVERY 6 HOURS AS NEEDED FOR PAIN 60 tablet 0  . ULORIC 40 MG tablet TAKE 1 TABLET BY MOUTH EVERY DAY 30 tablet 2  . valACYclovir (VALTREX) 500 MG tablet Take 500 mg by mouth 2 (two) times daily.    Marland Kitchen venlafaxine XR (EFFEXOR-XR) 37.5 MG 24 hr capsule TAKE 2 CAPSULES BY MOUTH TWICE A DAY 120 capsule 2  . cyclobenzaprine (FLEXERIL) 10 MG tablet Take 1 tablet (10 mg total) by mouth 3 (three) times daily as needed for muscle spasms. (Patient not taking: Reported on 04/20/2014) 30 tablet 2  . hydrOXYzine (ATARAX/VISTARIL) 25 MG tablet TAKE 1 TABLET (25 MG TOTAL) BY MOUTH EVERY 6 (SIX) HOURS AS NEEDED FOR ITCHING. 120 tablet 1   No current facility-administered medications for this visit.    Past Medical History  Diagnosis Date    . IDDM (insulin dependent diabetes mellitus)     38 years  . Retinopathy     x2  . Proteinuria   . Dyslipidemia   . Anemia, iron deficiency On procrit  . Hematuria, microscopic 10/09    work up negative (Dr. Amalia Hailey)  . Hypothyroidism   . CKD (chronic kidney disease)   . Low HDL (under 40)   . 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 Hyperkalemia   . Depression   . Gastroparesis diabeticorum   . Gastroesophageal reflux disease   . Degenerative arthritis   . HIV positive   . CKD (chronic kidney disease) stage 3, GFR 30-59 ml/min   . SIRS (systemic inflammatory response syndrome)   . CAP (community acquired pneumonia)     Past Surgical History  Procedure Laterality Date  . Eye surgery  [phone]    x2   . Vitrectomy  bilateral  . Insulin pump    . Colonoscopy    . Esophagogastroduodenoscopy    . Lasik Bilateral   . Cataract extraction Right   . Video bronchoscopy Bilateral 12/15/2012    Procedure: VIDEO BRONCHOSCOPY WITH FLUORO;  Surgeon: Kathee Delton, MD;  Location: [facility];  Service: Cardiopulmonary;  Laterality: Bilateral;    ROS:  Positive for reflux, leg cramps, rhinitis. Otherwise as stated in the HPI and negative for all other systems.  PHYSICAL EXAM BP 110/80 mmHg  Pulse 80  Ht 5\' 10"  (1.778 m)  Wt 215 lb (97.523 kg)  BMI 30.85 kg/m2 GENERAL:  Well appearing HEENT:  Pupils equal round and reactive, fundi not visualized, oral mucosa unremarkable NECK:  No jugular venous distention, waveform within normal limits, carotid upstroke brisk and symmetric, no bruits, no thyromegaly LYMPHATICS:  No cervical, inguinal adenopathy LUNGS:  Clear to auscultation bilaterally BACK:  No CVA tenderness CHEST:  Unremarkable HEART:  PMI not  displaced or sustained,S1 and S2 within normal limits, no S3, no S4, no clicks, no rubs, no murmurs ABD:  Flat, positive bowel sounds normal in frequency in pitch, no bruits, no rebound, no guarding, no midline pulsatile mass, no hepatomegaly, no splenomegaly, insulin pump EXT:  2 plus pulses throughout, no edema, no cyanosis no clubbing SKIN:  No rashes no nodules NEURO:  Cranial nerves II through XII grossly intact, motor grossly intact throughout PSYCH:  Cognitively intact, oriented to  person place and time  EKG:  Sinus rhythm, rate 80, axis rightward, intervals within normal limits, no acute ST-T wave changes.  04/20/2014  ASSESSMENT AND PLAN  CARDIOVASCULAR RISK REDUCTION:  The patient has extensive risk factors. He's not exercising routinely. I would like him to do an exercise routine to include probably walking. We discussed this at length. Doing this he should be screened given his high risk for obstructive disease. I will bring the patient back for a POET (Plain Old Exercise Test). This will allow me to screen for obstructive coronary disease, risk stratify and very importantly provide a prescription for exercise.  HTN:  The blood pressure is at target. No change in medications is indicated. We will continue with therapeutic lifestyle changes (TLC).  DYSLIPIDEMIA:   I did review his lipid profile. His HDL which used to be low is now improved.  DM:  His A1c was 7.2. He will continue the meds as listed.

## 2014-04-20 NOTE — Progress Notes (Signed)
Labwork for Dr Antionette Fairy

## 2014-04-21 LAB — BMP8+EGFR
BUN/Creatinine Ratio: 19 (ref 9–20)
BUN: 29 mg/dL — ABNORMAL HIGH (ref 6–24)
CALCIUM: 9.6 mg/dL (ref 8.7–10.2)
CO2: 24 mmol/L (ref 18–29)
CREATININE: 1.51 mg/dL — AB (ref 0.76–1.27)
Chloride: 99 mmol/L (ref 97–108)
GFR, EST AFRICAN AMERICAN: 64 mL/min/{1.73_m2} (ref >59)
GFR, EST NON AFRICAN AMERICAN: 55 mL/min/{1.73_m2} — AB (ref >59)
GLUCOSE: 125 mg/dL — AB (ref 65–99)
POTASSIUM: 4.2 mmol/L (ref 3.5–5.2)
Sodium: 138 mmol/L (ref 134–144)

## 2014-04-22 ENCOUNTER — Other Ambulatory Visit: Payer: Self-pay | Admitting: Family Medicine

## 2014-04-25 ENCOUNTER — Other Ambulatory Visit: Payer: Self-pay | Admitting: *Deleted

## 2014-04-25 ENCOUNTER — Telehealth: Payer: Self-pay | Admitting: Family Medicine

## 2014-04-25 ENCOUNTER — Ambulatory Visit (INDEPENDENT_AMBULATORY_CARE_PROVIDER_SITE_OTHER): Payer: BLUE CROSS/BLUE SHIELD | Admitting: Internal Medicine

## 2014-04-25 ENCOUNTER — Encounter: Payer: Self-pay | Admitting: Internal Medicine

## 2014-04-25 VITALS — BP 126/85 | HR 87 | Temp 97.6°F | Ht 70.0 in | Wt 235.0 lb

## 2014-04-25 DIAGNOSIS — B20 Human immunodeficiency virus [HIV] disease: Secondary | ICD-10-CM

## 2014-04-25 NOTE — Addendum Note (Signed)
Addended by: Dolan Amen D on: 04/25/2014 04:15 PM   Modules accepted: Orders

## 2014-04-25 NOTE — Assessment & Plan Note (Addendum)
Doing well, labs today.  RTC 4 months unless concerns.  Hep A and B next visit. Got pneumovax before, ok to do Prevnar (not available here) by PCP.

## 2014-04-25 NOTE — Progress Notes (Signed)
  Subjective:    Patient ID: Patrick Brown, male    DOB: Jun 29, 1970, 44 y.o.   MRN: [id number]  HPI Is in for followup of HIV.  He continues on [place] and Epzicom. He feels good, though some fatigue.   No longer needs to see pulmonary. He denies any missed doses of his medications.  Last CD4 was 310 and viral load remains undetectable.    Review of Systems  Constitutional: Negative for fever.  HENT: Negative for sore throat.   Eyes: Negative for visual disturbance.  Respiratory: Negative for shortness of breath.   Gastrointestinal: Negative for nausea, abdominal pain and diarrhea.  Skin:       Improved rash  Neurological: Negative for dizziness.       Objective:   Physical Exam  Constitutional: He appears well-developed and well-nourished. No distress.  HENT:  Mouth/Throat: No oropharyngeal exudate.  Eyes: No scleral icterus.  Cardiovascular: Normal rate, regular rhythm and normal heart sounds.   No murmur heard. Pulmonary/Chest: Effort normal and breath sounds normal. No respiratory distress.  Lymphadenopathy:    He has no cervical adenopathy.  Skin: No rash noted.          Assessment & Plan:

## 2014-04-25 NOTE — Addendum Note (Signed)
Addended by: Dolan Amen D on: 04/25/2014 04:13 PM   Modules accepted: Orders

## 2014-04-25 NOTE — Addendum Note (Signed)
Addended by: Dolan Amen D on: 04/25/2014 04:21 PM   Modules accepted: Orders

## 2014-04-26 LAB — T-HELPER CELL (CD4) - (RCID CLINIC ONLY)
CD4 % Helper T Cell: 8 % — ABNORMAL LOW (ref 33–55)
CD4 T Cell Abs: 330 /uL — ABNORMAL LOW (ref 400–2700)

## 2014-04-27 LAB — HIV-1 RNA QUANT-NO REFLEX-BLD
HIV 1 RNA Quant: <20 copies/mL (ref <20)
HIV-1 RNA Quant, Log: <1.3 {Log} (ref <1.30)

## 2014-05-03 ENCOUNTER — Ambulatory Visit (INDEPENDENT_AMBULATORY_CARE_PROVIDER_SITE_OTHER): Payer: BLUE CROSS/BLUE SHIELD | Admitting: Family Medicine

## 2014-05-03 ENCOUNTER — Encounter: Payer: Self-pay | Admitting: Family Medicine

## 2014-05-03 VITALS — BP 123/85 | HR 102 | Temp 97.8°F | Ht 70.0 in | Wt 211.0 lb

## 2014-05-03 DIAGNOSIS — R5383 Other fatigue: Secondary | ICD-10-CM | POA: Diagnosis not present

## 2014-05-03 DIAGNOSIS — R0683 Snoring: Secondary | ICD-10-CM | POA: Diagnosis not present

## 2014-05-03 LAB — POCT CBC
GRANULOCYTE PERCENT: 36.1 % — AB (ref 37–80)
HEMATOCRIT: 47.5 % (ref 43.5–53.7)
Hemoglobin: 14.6 g/dL (ref 14.1–18.1)
Lymph, poc: 4.5 — AB (ref 0.6–3.4)
MCH, POC: 30.3 pg (ref 27–31.2)
MCHC: 30.8 g/dL — AB (ref 31.8–35.4)
MCV: 98.2 fL — AB (ref 80–97)
MPV: 5.7 fL (ref 0–99.8)
PLATELET COUNT, POC: 231 10*3/uL (ref 142–424)
POC Granulocyte: 3.1 (ref 2–6.9)
POC LYMPH PERCENT: 52 %L — AB (ref 10–50)
RBC: 4.84 M/uL (ref 4.69–6.13)
RDW, POC: 13.6 %
WBC: 8.7 10*3/uL (ref 4.6–10.2)

## 2014-05-03 NOTE — Patient Instructions (Signed)
We will arrange for you to have a sleep apnea evaluation at one of the sleep clinics in [place] They will evaluate you and recommend treatment for you

## 2014-05-03 NOTE — Progress Notes (Signed)
Subjective:    Patient ID: Patrick Brown, male    DOB: Apr 05, 1970, 44 y.o.   MRN: [id number]  HPI Patient here today for sleep issues. He is doing more snoring and is feeling more fatigue recently. He is accompanied today by his mother. The mother is more aware of the snoring and sleep issues than the patient. She says that the snoring has been much worse over the past 3-4 months and he seems to want to sleep a lot during the day. The patient is not aware of this as much other than his fatigue.        Patient Active Problem List   Diagnosis Date Noted  . Screening examination for venereal disease 12/23/2013  . Pain in joint, lower leg 11/19/2012  . Unspecified sinusitis (chronic) 09/16/2012  . Pneumocystis carinii pneumonia 08/19/2012  . Itch 08/17/2012  . Hypercalcemia 08/09/2012  . Right groin wound 08/09/2012  . Hyperkalemia 08/07/2012  . Acute respiratory failure with hypoxia 08/05/2012  . Hyponatremia 08/05/2012  . HCAP (healthcare-associated pneumonia) 08/05/2012  . SOB (shortness of breath) 07/14/2012  . HIV disease 06/02/2012  . Convulsions/seizures 05/16/2012  . SIRS (systemic inflammatory response syndrome) 04/18/2012  . Pulmonary infiltrate 04/18/2012  . Gastroparesis 04/18/2012  . Sinus tachycardia 04/18/2012  . Near syncope 04/16/2012  . Anemia, iron deficiency   . DM (diabetes mellitus), type 1, uncontrolled 05/08/2011  . Hypothyroid 05/08/2011  . Chronic kidney disease, stage 3, mod decreased GFR 12/31/2010   Outpatient Encounter Prescriptions as of 05/03/2014  Medication Sig  . abacavir-lamiVUDine (EPZICOM) 600-300 MG per tablet TAKE ONE TABLET (600MG -300MG ) BY MOUTH ONCE DAILY AT BEDTIME. STORE ATCONTROLLED ROOM TEMPERATURE.  Marland Kitchen acetaminophen (TYLENOL) 500 MG tablet Take 1,000 mg by mouth 2 (two) times daily.   . ANDROGEL PUMP 20.25 MG/ACT (1.62%) GEL APPLY 2 PUMPS ONCE A DAY AS DIRECTED  . aspirin 81 MG chewable tablet Chew 81 mg by mouth every morning.    . colchicine 0.6 MG tablet Take 0.6 mg by mouth as needed.  . cyclobenzaprine (FLEXERIL) 10 MG tablet Take 1 tablet (10 mg total) by mouth 3 (three) times daily as needed for muscle spasms.  . dolutegravir (TIVICAY) 50 MG tablet TAKE ONE TABLET (50MG ) BY MOUTH ONCE DAILY AT BEDTIME. STORE AT CONTROLLED ROOM TEMPERATURE.  . ferrous sulfate 325 (65 FE) MG tablet Take 650 mg by mouth daily with breakfast.   . fludrocortisone (FLORINEF) 0.1mg /mL SUSP Take 0.2 mg by mouth daily.   . furosemide (LASIX) 20 MG tablet Take 20 mg by mouth 2 (two) times daily.  . Glucosamine-Chondroit-Vit C-Mn (GLUCOSAMINE 1500 COMPLEX PO) Take 1 tablet by mouth 2 (two) times daily.   . hydrOXYzine (ATARAX/VISTARIL) 25 MG tablet TAKE 1 TABLET (25 MG TOTAL) BY MOUTH EVERY 6 (SIX) HOURS AS NEEDED FOR ITCHING.  Marland Kitchen levothyroxine (SYNTHROID, LEVOTHROID) 175 MCG tablet Take 175 mcg by mouth daily before breakfast.  . loratadine (CLARITIN) 10 MG tablet Take 10 mg by mouth daily as needed for allergies.  Marland Kitchen LORazepam (ATIVAN) 0.5 MG tablet TAKE 1 TABLET BY MOUTH EVERY DAY AS NEEDED  . metoCLOPramide (REGLAN) 5 MG tablet TAKE 1 TABLET BY MOUTH 3 TIMES A DAY BEFORE MEALS  . NEXIUM 40 MG capsule TAKE ONE CAPSULE BY MOUTH DAILY  . niacin (NIASPAN) 500 MG CR tablet TAKE 3 TABLETS BY MOUTH EVERY DAY  . NOVOLOG 100 UNIT/ML injection USE PER PUMP AS DIRECTED  . omega-3 acid ethyl esters (LOVAZA) 1 G capsule TAKE 2 CAPSULES (  2 G TOTAL) BY MOUTH 2 (TWO) TIMES DAILY.  Marland Kitchen ondansetron (ZOFRAN) 4 MG tablet Take 1 tablet (4 mg total) by mouth every 8 (eight) hours as needed for nausea.  Marland Kitchen OVER THE COUNTER MEDICATION Take 1 capsule by mouth daily. [name]- probiotic daily  . simvastatin (ZOCOR) 40 MG tablet TAKE 1 TABLET (40 MG TOTAL) BY MOUTH AT BEDTIME.  . sodium bicarbonate 650 MG tablet Take 650 mg by mouth every morning.   . traMADol (ULTRAM) 50 MG tablet TAKE 1 TABLET BY MOUTH EVERY 6 HOURS AS NEEDED FOR PAIN  . ULORIC 40 MG tablet TAKE 1  TABLET BY MOUTH EVERY DAY  . valACYclovir (VALTREX) 500 MG tablet Take 500 mg by mouth 2 (two) times daily.  Marland Kitchen venlafaxine XR (EFFEXOR-XR) 37.5 MG 24 hr capsule TAKE 2 CAPSULES BY MOUTH TWICE A DAY  . [DISCONTINUED] levothyroxine (SYNTHROID, LEVOTHROID) 200 MCG tablet TAKE 1 TABLET (200 MCG TOTAL) BY MOUTH DAILY.    Review of Systems  Constitutional: Positive for fatigue.  HENT: Negative.   Eyes: Negative.   Respiratory: Negative.        Snoring  Cardiovascular: Negative.   Gastrointestinal: Negative.   Endocrine: Negative.   Genitourinary: Negative.   Musculoskeletal: Negative.   Skin: Negative.   Allergic/Immunologic: Negative.   Neurological: Negative.   Hematological: Negative.   Psychiatric/Behavioral: Positive for sleep disturbance.       Objective:   Physical Exam  Constitutional: He is oriented to person, place, and time. He appears well-developed and well-nourished. No distress.  HENT:  Head: Normocephalic and atraumatic.  Right Ear: External ear normal.  Left Ear: External ear normal.  Nose: Nose normal.  Mouth/Throat: Oropharynx is clear and moist.  Eyes: Conjunctivae and EOM are normal. Pupils are equal, round, and reactive to light. Right eye exhibits no discharge.  Neck: Normal range of motion. Neck supple. No thyromegaly present.  Cardiovascular: Normal rate, regular rhythm and normal heart sounds.   No murmur heard. At 72/m  Pulmonary/Chest: Effort normal and breath sounds normal. No respiratory distress. He has no wheezes. He has no rales. He exhibits no tenderness.  Musculoskeletal: Normal range of motion.  Neurological: He is alert and oriented to person, place, and time.  Skin: Skin is warm and dry. No rash noted.  Psychiatric: He has a normal mood and affect. His behavior is normal. Judgment and thought content normal.  Nursing note and vitals reviewed.  BP 123/85 mmHg  Pulse 102  Temp(Src) 97.8 F (36.6 C) (Oral)  Ht 5\' 10"  (1.778 m)  Wt 211 lb  (95.709 kg)  BMI 30.28 kg/m2        Assessment & Plan:  1. Other fatigue - Ambulatory referral to Pulmonology - POCT CBC - Thyroid Panel With TSH  2. Snoring - Ambulatory referral to Pulmonology  Patient Instructions  We will arrange for you to have a sleep apnea evaluation at one of the sleep clinics in [place] They will evaluate you and recommend treatment for you   Arrie Senate MD

## 2014-05-04 LAB — THYROID PANEL WITH TSH
Free Thyroxine Index: 2.6 (ref 1.2–4.9)
T3 Uptake Ratio: 30 % (ref 24–39)
T4, Total: 8.6 ug/dL (ref 4.5–12.0)
TSH: 0.475 u[IU]/mL (ref 0.450–4.500)

## 2014-05-06 ENCOUNTER — Other Ambulatory Visit: Payer: Self-pay | Admitting: Family Medicine

## 2014-05-12 ENCOUNTER — Other Ambulatory Visit: Payer: Self-pay | Admitting: Family Medicine

## 2014-05-20 ENCOUNTER — Other Ambulatory Visit: Payer: Self-pay | Admitting: Nurse Practitioner

## 2014-05-20 ENCOUNTER — Encounter: Payer: Self-pay | Admitting: *Deleted

## 2014-05-20 DIAGNOSIS — R0602 Shortness of breath: Secondary | ICD-10-CM

## 2014-06-02 ENCOUNTER — Other Ambulatory Visit: Payer: Self-pay | Admitting: Family Medicine

## 2014-06-07 ENCOUNTER — Encounter: Payer: BLUE CROSS/BLUE SHIELD | Admitting: Nurse Practitioner

## 2014-06-07 ENCOUNTER — Other Ambulatory Visit: Payer: Self-pay | Admitting: *Deleted

## 2014-06-07 ENCOUNTER — Ambulatory Visit (INDEPENDENT_AMBULATORY_CARE_PROVIDER_SITE_OTHER): Payer: BLUE CROSS/BLUE SHIELD | Admitting: Nurse Practitioner

## 2014-06-07 DIAGNOSIS — R0602 Shortness of breath: Secondary | ICD-10-CM

## 2014-06-07 DIAGNOSIS — B20 Human immunodeficiency virus [HIV] disease: Secondary | ICD-10-CM

## 2014-06-07 MED ORDER — ABACAVIR SULFATE-LAMIVUDINE 600-300 MG PO TABS: ORAL_TABLET | ORAL | Status: DC

## 2014-06-07 MED ORDER — DOLUTEGRAVIR SODIUM 50 MG PO TABS: ORAL_TABLET | ORAL | Status: DC

## 2014-06-07 NOTE — Progress Notes (Signed)
Patient presents today for routine GXT. Has had long standing diabetes since age 44. Some DOE. Does not exercise routinely.    Today the patient exercised on the standard Bruce protocol for a total of 4:43 minutes.  Reduced exercise tolerance.  Adequate blood pressure response.  Clinically negative for chest pain. Test was stopped due to fatigue/achievement of target HR.  EKG negative for ischemia. No significant arrhythmia noted.    Recommendations:  CV risk factor modification See back as planned  Patient is agreeable to this plan and will call if any problems develop in the interim.    Burtis Junes, RN, [facility] [place] [address] [place], [place]  [phone]

## 2014-06-09 LAB — EXERCISE TOLERANCE TEST
Estimated workload: 6.6 METS
Exercise duration (min): 4 min
Exercise duration (sec): 43 s
MPHR: 175 {beats}/min
Peak HR: 95 {beats}/min
Percent HR: 85 %
Percent of predicted max HR: 54 %
RPE: 17
Rest HR: 99 {beats}/min
Stage 1 DBP: 82 mmHg
Stage 1 Grade: 0 %
Stage 1 HR: 107 {beats}/min
Stage 1 SBP: 124 mmHg
Stage 1 Speed: 0 mph
Stage 2 Grade: 0 %
Stage 2 HR: 107 {beats}/min
Stage 2 Speed: 1 mph
Stage 3 Grade: 0 %
Stage 3 HR: 107 {beats}/min
Stage 3 Speed: 1 mph
Stage 4 DBP: 80 mmHg
Stage 4 Grade: 10 %
Stage 4 HR: 130 {beats}/min
Stage 4 SBP: 164 mmHg
Stage 4 Speed: 1.7 mph
Stage 5 Grade: 12 %
Stage 5 HR: 95 {beats}/min
Stage 5 Speed: 2.5 mph
Stage 6 DBP: 79 mmHg
Stage 6 Grade: 0 %
Stage 6 HR: 141 {beats}/min
Stage 6 SBP: 178 mmHg
Stage 6 Speed: 0 mph
Stage 7 DBP: 82 mmHg
Stage 7 Grade: 0 %
Stage 7 HR: 111 {beats}/min
Stage 7 SBP: 164 mmHg
Stage 7 Speed: 0 mph

## 2014-06-10 ENCOUNTER — Other Ambulatory Visit: Payer: Self-pay | Admitting: Family Medicine

## 2014-06-22 ENCOUNTER — Encounter: Payer: Self-pay | Admitting: Pulmonary Disease

## 2014-06-22 ENCOUNTER — Ambulatory Visit (INDEPENDENT_AMBULATORY_CARE_PROVIDER_SITE_OTHER): Payer: BLUE CROSS/BLUE SHIELD | Admitting: Pulmonary Disease

## 2014-06-22 VITALS — BP 104/78 | HR 106 | Ht 70.0 in | Wt 209.4 lb

## 2014-06-22 DIAGNOSIS — G4733 Obstructive sleep apnea (adult) (pediatric): Secondary | ICD-10-CM

## 2014-06-22 NOTE — Patient Instructions (Signed)
Will arrange for sleep study Will call to arrange for follow up after sleep study reviewed 

## 2014-06-22 NOTE — Progress Notes (Signed)
   Subjective:    Patient ID: Patrick Brown, male    DOB: 02-03-1971, 44 y.o.   MRN: [id number]  HPI    Review of Systems  Constitutional: Positive for unexpected weight change. Negative for fever.  HENT: Positive for congestion. Negative for dental problem, ear pain, nosebleeds, postnasal drip, rhinorrhea, sinus pressure, sneezing, sore throat and trouble swallowing.   Eyes: Negative for redness and itching.  Respiratory: Positive for shortness of breath. Negative for cough, chest tightness and wheezing.   Cardiovascular: Negative for palpitations and leg swelling.  Gastrointestinal: Negative for nausea and vomiting.  Genitourinary: Negative for dysuria.  Musculoskeletal: Negative for joint swelling.  Skin: Positive for rash. Wound:  itching.  Neurological: Negative for headaches.  Hematological: Does not bruise/bleed easily.  Psychiatric/Behavioral: Positive for dysphoric mood. The patient is not nervous/anxious.        Objective:   Physical Exam        Assessment & Plan:

## 2014-06-22 NOTE — Progress Notes (Signed)
Chief Complaint  Patient presents with  . SLEEP CONSULT    Referred by Dr Laurance Flatten. Eworth Score: 17    History of Present Illness: Patrick Brown is a 44 y.o. male for evaluation of sleep problems.  He has trouble staying awake, and feels fatigued all day.  He has undergone extensive evaluation w/o finding for cause.  He has been snoring, and stops breathing while asleep.  He can't sleep on his back, and his mouth gets dry at night.  He goes to sleep at 11 pm.  He falls asleep quickly.  He wakes up one time to use the bathroom.  He gets out of bed at 9 am.  He feels tired in the morning.  He denies morning headache.  He does not use anything to help him fall sleep or stay awake.  He denies sleep walking, sleep talking, bruxism, or nightmares.  There is no history of restless legs.  He denies sleep hallucinations, sleep paralysis, or cataplexy.  The Epworth score is 17 out of 24.  Patrick Brown  has a past medical history of IDDM (insulin dependent diabetes mellitus); Retinopathy; Proteinuria; Dyslipidemia; Anemia, iron deficiency (On procrit); Hematuria, microscopic (10/09); Hypothyroidism; CKD (chronic kidney disease); Low HDL (under 40); Hyperkalemia, diminished renal excretion (06/2011 secondary to TMP/SMZ; prior secondary to  ARBS; ); Depression; Gastroparesis diabeticorum; Gastroesophageal reflux disease; Degenerative arthritis; HIV positive; CKD (chronic kidney disease) stage 3, GFR 30-59 ml/min; SIRS (systemic inflammatory response syndrome); and CAP (community acquired pneumonia).  Patrick Brown  has past surgical history that includes Eye surgery [phone]); Vitrectomy (bilateral); insulin pump; Colonoscopy; Esophagogastroduodenoscopy; LASIK (Bilateral); Cataract extraction (Right); and Video bronchoscopy (Bilateral, 12/15/2012).  Prior to Admission medications   Medication Sig Start Date End Date Taking? Authorizing Provider  abacavir-lamiVUDine (EPZICOM) 600-300 MG per tablet TAKE ONE  TABLET (600MG -300MG ) BY MOUTH ONCE DAILY AT BEDTIME. STORE ATCONTROLLED ROOM TEMPERATURE. 06/07/14   Thayer Headings, MD  acetaminophen (TYLENOL) 500 MG tablet Take 1,000 mg by mouth 2 (two) times daily.     Historical Provider, MD  amoxicillin-clavulanate (AUGMENTIN) 875-125 MG per tablet TAKE 1 TABLET BY MOUTH 2 (TWO) TIMES DAILY. 06/05/14   Chipper Herb, MD  ANDROGEL PUMP 20.25 MG/ACT (1.62%) GEL APPLY 2 PUMPS ONCE A DAY AS DIRECTED 03/28/14   Chipper Herb, MD  aspirin 81 MG chewable tablet Chew 81 mg by mouth every morning.    Historical Provider, MD  colchicine 0.6 MG tablet Take 0.6 mg by mouth as needed. 10/12/13   Chipper Herb, MD  cyclobenzaprine (FLEXERIL) 10 MG tablet Take 1 tablet (10 mg total) by mouth 3 (three) times daily as needed for muscle spasms. 09/23/12   Chipper Herb, MD  dolutegravir (TIVICAY) 50 MG tablet TAKE ONE TABLET (50MG ) BY MOUTH ONCE DAILY AT BEDTIME. STORE AT CONTROLLED ROOM TEMPERATURE. 06/07/14   Thayer Headings, MD  dolutegravir (TIVICAY) 50 MG tablet TAKE ONE TABLET (50MG ) BY MOUTH ONCE DAILY AT BEDTIME. STORE AT CONTROLLED ROOM TEMPERATURE. 06/07/14   Thayer Headings, MD  ferrous sulfate 325 (65 FE) MG tablet Take 650 mg by mouth daily with breakfast.     Historical Provider, MD  fludrocortisone (FLORINEF) 0.1mg /mL SUSP Take 0.2 mg by mouth daily.     Historical Provider, MD  furosemide (LASIX) 20 MG tablet Take 20 mg by mouth 2 (two) times daily.    Historical Provider, MD  Glucosamine-Chondroit-Vit C-Mn (GLUCOSAMINE 1500 COMPLEX PO) Take 1 tablet by mouth 2 (two) times daily.  Historical Provider, MD  hydrOXYzine (ATARAX/VISTARIL) 25 MG tablet TAKE 1 TABLET (25 MG TOTAL) BY MOUTH EVERY 6 (SIX) HOURS AS NEEDED FOR ITCHING. 02/22/13   Thayer Headings, MD  levothyroxine (SYNTHROID, LEVOTHROID) 175 MCG tablet Take 175 mcg by mouth daily before breakfast.    Historical Provider, MD  loratadine (CLARITIN) 10 MG tablet Take 10 mg by mouth daily as needed for allergies.     Historical Provider, MD  LORazepam (ATIVAN) 0.5 MG tablet TAKE 1 TABLET BY MOUTH EVERY DAY AS NEEDED 10/12/13   Chipper Herb, MD  metoCLOPramide (REGLAN) 5 MG tablet TAKE 1 TABLET BY MOUTH 3 TIMES A DAY BEFORE MEALS 04/01/14   Chipper Herb, MD  NEXIUM 40 MG capsule TAKE ONE CAPSULE BY MOUTH DAILY 05/06/14   Chipper Herb, MD  niacin (NIASPAN) 500 MG CR tablet TAKE 3 TABLETS BY MOUTH EVERY DAY 12/27/13   Chipper Herb, MD  NOVOLOG 100 UNIT/ML injection USE PER PUMP AS DIRECTED 01/31/14   Chipper Herb, MD  omega-3 acid ethyl esters (LOVAZA) 1 G capsule TAKE 2 CAPSULES (2 G TOTAL) BY MOUTH 2 (TWO) TIMES DAILY. 06/05/14   Chipper Herb, MD  ondansetron (ZOFRAN) 4 MG tablet Take 1 tablet (4 mg total) by mouth every 8 (eight) hours as needed for nausea. 08/12/12   Robbie Lis, MD  OVER THE COUNTER MEDICATION Take 1 capsule by mouth daily. [name]- probiotic daily    Historical Provider, MD  simvastatin (ZOCOR) 40 MG tablet TAKE 1 TABLET (40 MG TOTAL) BY MOUTH AT BEDTIME. 06/05/14   Chipper Herb, MD  sodium bicarbonate 650 MG tablet Take 650 mg by mouth every morning.     Historical Provider, MD  traMADol (ULTRAM) 50 MG tablet TAKE 1 TABLET BY MOUTH EVERY 6 HOURS AS NEEDED FOR PAIN 03/28/14   Chipper Herb, MD  ULORIC 40 MG tablet TAKE 1 TABLET BY MOUTH EVERY DAY 06/13/14   Chipper Herb, MD  valACYclovir (VALTREX) 500 MG tablet Take 500 mg by mouth 2 (two) times daily.    Historical Provider, MD  venlafaxine XR (EFFEXOR-XR) 37.5 MG 24 hr capsule TAKE 2 CAPSULES BY MOUTH TWICE A DAY 04/04/14   Chipper Herb, MD    Allergies  Allergen Reactions  . Sulfa Antibiotics Other (See Comments)    High potassium High potassium  . Ramipril Cough  . Versed [Midazolam] Other (See Comments)    "I don't wake up very good or clear it out of my system" "I don't wake up very good or clear it out of my system"  . Ramipril Other (See Comments) and Cough    His family history includes Dementia in his other,  other, and other; Diabetes type I in his brother; Prostate cancer in his other.  He  reports that he has never smoked. He has never used smokeless tobacco. He reports that he does not drink alcohol or use illicit drugs.   Physical Exam: Blood pressure 104/78, pulse 106, height 5\' 10"  (1.778 m), weight 209 lb 6.4 oz (94.983 kg), SpO2 98 %. Body mass index is 30.05 kg/(m^2).  General - No distress ENT - No sinus tenderness, no oral exudate, no LAN, no thyromegaly, TM clear, pupils equal/reactive, MP 4, 2+ tonsils Cardiac - s1s2 regular, no murmur, pulses symmetric Chest - No wheeze/rales/dullness, good air entry, normal respiratory excursion Back - No focal tenderness Abd - Soft, non-tender, no organomegaly, + bowel sounds Ext - No edema  Neuro - Normal strength, cranial nerves intact Skin - No rashes Psych - Normal mood, and behavior  Discussion: He has snoring, sleep disruption, witnessed apnea, and daytime sleepiness.  He has hx of HTN, and depression.  I am concerned he could have obstructive sleep apnea.  We discussed how sleep apnea can affect various health problems including risks for hypertension, cardiovascular disease, and diabetes.  We also discussed how sleep disruption can increase risks for accident, such as while driving.  Weight loss as a means of improving sleep apnea was also reviewed.  Additional treatment options discussed were CPAP therapy, oral appliance, and surgical intervention.   Assessment/plan:  Obstructive sleep apnea. Plan: - will arrange for in lab sleep study   Chesley Mires, M.D. Pager [phone]

## 2014-06-30 ENCOUNTER — Other Ambulatory Visit: Payer: Self-pay | Admitting: Family Medicine

## 2014-06-30 ENCOUNTER — Ambulatory Visit (HOSPITAL_BASED_OUTPATIENT_CLINIC_OR_DEPARTMENT_OTHER): Payer: BLUE CROSS/BLUE SHIELD | Attending: Pulmonary Disease | Admitting: Radiology

## 2014-06-30 VITALS — Ht 70.0 in | Wt 208.0 lb

## 2014-06-30 DIAGNOSIS — G4730 Sleep apnea, unspecified: Secondary | ICD-10-CM | POA: Insufficient documentation

## 2014-06-30 DIAGNOSIS — G4710 Hypersomnia, unspecified: Secondary | ICD-10-CM | POA: Diagnosis not present

## 2014-06-30 DIAGNOSIS — G4733 Obstructive sleep apnea (adult) (pediatric): Secondary | ICD-10-CM

## 2014-07-04 ENCOUNTER — Other Ambulatory Visit: Payer: Self-pay | Admitting: Family Medicine

## 2014-07-05 NOTE — Telephone Encounter (Signed)
Next appt sched for 07/22/14

## 2014-07-06 ENCOUNTER — Telehealth: Payer: Self-pay | Admitting: Pulmonary Disease

## 2014-07-06 DIAGNOSIS — G4733 Obstructive sleep apnea (adult) (pediatric): Secondary | ICD-10-CM | POA: Diagnosis not present

## 2014-07-06 NOTE — Telephone Encounter (Signed)
PSG 06/30/14 >> No sleep  Will have my nurse inform pt that he did not sleep at all during the sleep study.  Options are to have him repeat sleep study in sleep lab with him using a sleep aide medication on the night of the study, or see if his insurance will approve a home sleep study as an alternative.

## 2014-07-06 NOTE — Sleep Study (Signed)
[facility]  NAME: Patrick Brown DATE OF BIRTH:  Apr 02, 1970 MEDICAL RECORD NUMBER [id number]  LOCATION: [facility]  PHYSICIAN: Chesley Mires, M.D. DATE OF STUDY: 06/30/2014  SLEEP STUDY TYPE: Polysomnogram               REFERRING PHYSICIAN: Chesley Mires, MD  INDICATION FOR STUDY:  Patrick Brown is a 44 y.o. male who presents to the sleep lab for evaluation of hypersomnia with obstructive sleep apnea.  He reports snoring, sleep disruption, apnea, and daytime sleepiness.  EPWORTH SLEEPINESS SCORE: 14. HEIGHT: 5\' 10"  (177.8 cm)  WEIGHT: 208 lb (94.348 kg)    Body mass index is 29.84 kg/(m^2).  NECK SIZE: 17 in.  MEDICATIONS:  Current Outpatient Prescriptions on File Prior to Visit  Medication Sig Dispense Refill  . abacavir-lamiVUDine (EPZICOM) 600-300 MG per tablet TAKE ONE TABLET (600MG -300MG ) BY MOUTH ONCE DAILY AT BEDTIME. STORE ATCONTROLLED ROOM TEMPERATURE. 30 tablet 5  . acetaminophen (TYLENOL) 500 MG tablet Take 1,000 mg by mouth 2 (two) times daily.     . ANDROGEL PUMP 20.25 MG/ACT (1.62%) GEL APPLY 2 PUMPS ONCE A DAY AS DIRECTED (Patient not taking: Reported on 06/22/2014) 75 g 2  . aspirin 81 MG chewable tablet Chew 81 mg by mouth every morning.    . colchicine 0.6 MG tablet Take 0.6 mg by mouth as needed.    . cyclobenzaprine (FLEXERIL) 10 MG tablet Take 1 tablet (10 mg total) by mouth 3 (three) times daily as needed for muscle spasms. 30 tablet 2  . dolutegravir (TIVICAY) 50 MG tablet TAKE ONE TABLET (50MG ) BY MOUTH ONCE DAILY AT BEDTIME. STORE AT CONTROLLED ROOM TEMPERATURE. 30 tablet 5  . ferrous sulfate 325 (65 FE) MG tablet Take 650 mg by mouth daily with breakfast.     . fludrocortisone (FLORINEF) 0.1 MG tablet Take 0.2 mg by mouth daily.    . furosemide (LASIX) 20 MG tablet Take 20 mg by mouth 2 (two) times daily.    . Glucosamine-Chondroit-Vit C-Mn (GLUCOSAMINE 1500 COMPLEX PO) Take 1 tablet by mouth 2 (two) times daily.     .  hydrOXYzine (ATARAX/VISTARIL) 25 MG tablet TAKE 1 TABLET (25 MG TOTAL) BY MOUTH EVERY 6 (SIX) HOURS AS NEEDED FOR ITCHING. 120 tablet 1  . levothyroxine (SYNTHROID, LEVOTHROID) 175 MCG tablet Take 175 mcg by mouth daily before breakfast.    . loratadine (CLARITIN) 10 MG tablet Take 10 mg by mouth daily as needed for allergies.    Marland Kitchen LORazepam (ATIVAN) 0.5 MG tablet TAKE 1 TABLET BY MOUTH EVERY DAY AS NEEDED 30 tablet 2  . metoCLOPramide (REGLAN) 5 MG tablet TAKE 1 TABLET BY MOUTH 3 TIMES A DAY BEFORE MEALS 90 tablet 5  . NEXIUM 40 MG capsule TAKE ONE CAPSULE BY MOUTH DAILY 30 capsule 5  . NOVOLOG 100 UNIT/ML injection USE PER PUMP AS DIRECTED 60 mL 2  . omega-3 acid ethyl esters (LOVAZA) 1 G capsule TAKE 2 CAPSULES (2 G TOTAL) BY MOUTH 2 (TWO) TIMES DAILY. 120 capsule 1  . ondansetron (ZOFRAN) 4 MG tablet Take 1 tablet (4 mg total) by mouth every 8 (eight) hours as needed for nausea. 45 tablet 0  . OVER THE COUNTER MEDICATION Take 1 capsule by mouth daily. [name]- probiotic daily    . simvastatin (ZOCOR) 40 MG tablet TAKE 1 TABLET (40 MG TOTAL) BY MOUTH AT BEDTIME. 30 tablet 1  . sodium bicarbonate 650 MG tablet Take 650 mg by mouth every morning.     Marland Kitchen  traMADol (ULTRAM) 50 MG tablet TAKE 1 TABLET BY MOUTH EVERY 6 HOURS AS NEEDED FOR PAIN 60 tablet 0  . ULORIC 40 MG tablet TAKE 1 TABLET BY MOUTH EVERY DAY 30 tablet 3  . valACYclovir (VALTREX) 500 MG tablet Take 500 mg by mouth 2 (two) times daily.     No current facility-administered medications on file prior to visit.    SLEEP ARCHITECTURE:  Total recording time: 342.5 minutes.  Total sleep time was: 0 minutes.  Sleep efficiency: 0%.  Sleep latency: N/A minutes.  REM latency: N/A minutes.  Stage N1: 0%.  Stage N2: 0%.  Stage N3: 0%.  Stage R:  0%.  Supine sleep: 0 minutes.  Non-supine sleep: 0 minutes.  CARDIAC DATA:  Average heart rate: 77 beats per minute. Rhythm strip: sinus rhythm.  RESPIRATORY DATA: Average respiratory rate:  17.  MOVEMENT/PARASOMNIA:  Periodic limb movement: N/A.  Period limb movements with arousals: N/A. Restroom trips: One.  OXYGEN DATA:  Baseline oxygenation: 97%. Lowest SaO2: 91%. Time spent below SaO2 90%: N/A minutes. Supplemental oxygen used: None.  IMPRESSION/ RECOMMENDATION:   The patient did not have any sleep during this study.    Options at this point are to arrange for repeat in lab sleep study with him using a sleep aide medication, or arrange for home sleep study.    Chesley Mires, M.D. Diplomate, [employer] adviser of Sleep Medicine  ELECTRONICALLY SIGNED ON:  07/06/2014, 8:12 AM [place] PH: [phone]   FX: [phone] [place]

## 2014-07-07 NOTE — Telephone Encounter (Signed)
LMTCB x 1 

## 2014-07-08 ENCOUNTER — Other Ambulatory Visit: Payer: Self-pay | Admitting: Family Medicine

## 2014-07-08 MED ORDER — ZOLPIDEM TARTRATE 5 MG PO TABS: 5.0000 mg | ORAL_TABLET | Freq: Every evening | ORAL | Status: DC | PRN

## 2014-07-08 NOTE — Telephone Encounter (Signed)
Spoke with pt. He would like to proceed with repeating his sleep study in the lab with a sleep aid.  VS - please advise what sleep aid you would like the patient to take?

## 2014-07-08 NOTE — Telephone Encounter (Signed)
Pt is aware that prescription will be called in. Order has been placed for split night study. Nothing further was needed.

## 2014-07-08 NOTE — Telephone Encounter (Signed)
[phone], pt cb

## 2014-07-08 NOTE — Telephone Encounter (Signed)
Send script for ambien 5 mg.  Dispense 30 pills with no refills.  He should inform sleep lab staff that he is taking [place], and will need arrange for transportation in the morning after the sleep study since he might night be fully recovered from [place] dose after sleep study is done.  Please set up split night sleep study.

## 2014-07-11 ENCOUNTER — Ambulatory Visit: Payer: BLUE CROSS/BLUE SHIELD | Admitting: Family Medicine

## 2014-07-14 ENCOUNTER — Other Ambulatory Visit: Payer: Self-pay | Admitting: Family Medicine

## 2014-07-22 ENCOUNTER — Ambulatory Visit (INDEPENDENT_AMBULATORY_CARE_PROVIDER_SITE_OTHER): Payer: BLUE CROSS/BLUE SHIELD | Admitting: Family Medicine

## 2014-07-22 ENCOUNTER — Encounter: Payer: Self-pay | Admitting: Family Medicine

## 2014-07-22 VITALS — BP 107/82 | HR 114 | Temp 97.6°F | Ht 70.0 in | Wt 202.0 lb

## 2014-07-22 DIAGNOSIS — E291 Testicular hypofunction: Secondary | ICD-10-CM | POA: Diagnosis not present

## 2014-07-22 DIAGNOSIS — IMO0002 Reserved for concepts with insufficient information to code with codable children: Secondary | ICD-10-CM

## 2014-07-22 DIAGNOSIS — E039 Hypothyroidism, unspecified: Secondary | ICD-10-CM | POA: Diagnosis not present

## 2014-07-22 DIAGNOSIS — B20 Human immunodeficiency virus [HIV] disease: Secondary | ICD-10-CM

## 2014-07-22 DIAGNOSIS — E349 Endocrine disorder, unspecified: Secondary | ICD-10-CM

## 2014-07-22 DIAGNOSIS — E785 Hyperlipidemia, unspecified: Secondary | ICD-10-CM

## 2014-07-22 DIAGNOSIS — E1065 Type 1 diabetes mellitus with hyperglycemia: Secondary | ICD-10-CM | POA: Diagnosis not present

## 2014-07-22 DIAGNOSIS — N183 Chronic kidney disease, stage 3 unspecified: Secondary | ICD-10-CM

## 2014-07-22 DIAGNOSIS — E559 Vitamin D deficiency, unspecified: Secondary | ICD-10-CM | POA: Diagnosis not present

## 2014-07-22 LAB — POCT UA - MICROALBUMIN: MICROALBUMIN (UR) POC: 100 mg/L

## 2014-07-22 LAB — POCT GLYCOSYLATED HEMOGLOBIN (HGB A1C): Hemoglobin A1C: 6.9

## 2014-07-22 NOTE — Addendum Note (Signed)
Addended by: Selmer Dominion on: 07/22/2014 04:21 PM   Modules accepted: Orders

## 2014-07-22 NOTE — Patient Instructions (Addendum)
Continue current medications. Continue good therapeutic lifestyle changes which include good diet and exercise. Fall precautions discussed with patient. If an FOBT was given today- please return it to our front desk. If you are over 46 years old - you may need Prevnar 48 or the adult Pneumonia vaccine.  Flu Shots are still available at our office. If you still haven't had one please call to set up a nurse visit to get one.   After your visit with [place] today you will receive a survey in the mail or online from [employer] regarding your care with [place]. Please take a moment to fill this out. Your feedback is very important to [place] as you can help [place] better understand your patient needs as well as improve your experience and satisfaction. WE CARE ABOUT YOU!!!   The patient should follow-up regularly with his nephrologist, his infectious disease doctor, and his pulmonary doctor. He should follow their recommendations and keep these appointments. Monitor blood sugars closely

## 2014-07-22 NOTE — Progress Notes (Signed)
Subjective:    Patient ID: Patrick Brown, male    DOB: [birth year], 44 y.o.   MRN: [id number]  HPI Pt here for follow up and management of chronic medical problems which includes hyperlipidemia, hypothyroid, and type 1 diabetes. He is taking medications regularly. The patient is doing well with no specific. He is followed regularly by infectious disease in [facility] 5 nephrologist in [place] and by pulmonary in [place] for sleep apnea evaluation. He denies chest pain shortness of breath any problems with his GI tract and any problems with voiding. He does have occasional sinus infections and he takes Augmentin for this and this is under good control today.      Patient Active Problem List   Diagnosis Date Noted  . Screening examination for venereal disease 12/23/2013  . Pain in joint, lower leg 11/19/2012  . Unspecified sinusitis (chronic) 09/16/2012  . Pneumocystis carinii pneumonia 08/19/2012  . Itch 08/17/2012  . Hypercalcemia 08/09/2012  . Right groin wound 08/09/2012  . Hyperkalemia 08/07/2012  . Acute respiratory failure with hypoxia 08/05/2012  . Hyponatremia 08/05/2012  . HCAP (healthcare-associated pneumonia) 08/05/2012  . SOB (shortness of breath) 07/14/2012  . HIV disease 06/02/2012  . Convulsions/seizures 05/16/2012  . SIRS (systemic inflammatory response syndrome) 04/18/2012  . Pulmonary infiltrate 04/18/2012  . Gastroparesis 04/18/2012  . Sinus tachycardia 04/18/2012  . Near syncope 04/16/2012  . Anemia, iron deficiency   . DM (diabetes mellitus), type 1, uncontrolled 05/08/2011  . Hypothyroid 05/08/2011  . Chronic kidney disease, stage 3, mod decreased GFR 12/31/2010   Outpatient Encounter Prescriptions as of 07/22/2014  Medication Sig  . abacavir-lamiVUDine (EPZICOM) 600-300 MG per tablet TAKE ONE TABLET (600MG-300MG) BY MOUTH ONCE DAILY AT BEDTIME. STORE ATCONTROLLED ROOM TEMPERATURE.  Marland Kitchen acetaminophen (TYLENOL) 500 MG tablet Take 1,000 mg by mouth 2  (two) times daily.   Marland Kitchen aspirin 81 MG chewable tablet Chew 81 mg by mouth every morning.  . colchicine 0.6 MG tablet Take 0.6 mg by mouth as needed.  . cyclobenzaprine (FLEXERIL) 10 MG tablet Take 1 tablet (10 mg total) by mouth 3 (three) times daily as needed for muscle spasms.  . dolutegravir (TIVICAY) 50 MG tablet TAKE ONE TABLET (50MG) BY MOUTH ONCE DAILY AT BEDTIME. STORE AT CONTROLLED ROOM TEMPERATURE.  . ferrous sulfate 325 (65 FE) MG tablet Take 650 mg by mouth daily with breakfast.   . fludrocortisone (FLORINEF) 0.1 MG tablet Take 0.2 mg by mouth daily.  . furosemide (LASIX) 20 MG tablet Take 20 mg by mouth 2 (two) times daily.  . Glucosamine-Chondroit-Vit C-Mn (GLUCOSAMINE 1500 COMPLEX PO) Take 1 tablet by mouth 2 (two) times daily.   . hydrOXYzine (ATARAX/VISTARIL) 25 MG tablet TAKE 1 TABLET (25 MG TOTAL) BY MOUTH EVERY 6 (SIX) HOURS AS NEEDED FOR ITCHING.  Marland Kitchen levothyroxine (SYNTHROID, LEVOTHROID) 175 MCG tablet Take 175 mcg by mouth daily before breakfast.  . loratadine (CLARITIN) 10 MG tablet Take 10 mg by mouth daily as needed for allergies.  Marland Kitchen LORazepam (ATIVAN) 0.5 MG tablet TAKE 1 TABLET BY MOUTH EVERY DAY AS NEEDED  . metoCLOPramide (REGLAN) 5 MG tablet TAKE 1 TABLET BY MOUTH 3 TIMES A DAY BEFORE MEALS  . NEXIUM 40 MG capsule TAKE ONE CAPSULE BY MOUTH DAILY  . niacin (NIASPAN) 500 MG CR tablet TAKE 3 TABLETS BY MOUTH EVERY DAY  . NOVOLOG 100 UNIT/ML injection USE PER PUMP AS DIRECTED  . omega-3 acid ethyl esters (LOVAZA) 1 G capsule TAKE 2 CAPSULES (2 G TOTAL) BY MOUTH  2 (TWO) TIMES DAILY.  Marland Kitchen ondansetron (ZOFRAN) 4 MG tablet Take 1 tablet (4 mg total) by mouth every 8 (eight) hours as needed for nausea.  Marland Kitchen OVER THE COUNTER MEDICATION Take 1 capsule by mouth daily. [name]- probiotic daily  . simvastatin (ZOCOR) 40 MG tablet TAKE 1 TABLET (40 MG TOTAL) BY MOUTH AT BEDTIME.  . sodium bicarbonate 650 MG tablet Take 650 mg by mouth every morning.   . traMADol (ULTRAM) 50 MG tablet  TAKE 1 TABLET BY MOUTH EVERY 6 HOURS AS NEEDED FOR PAIN  . ULORIC 40 MG tablet TAKE 1 TABLET BY MOUTH EVERY DAY  . valACYclovir (VALTREX) 500 MG tablet Take 500 mg by mouth 2 (two) times daily.  Marland Kitchen venlafaxine XR (EFFEXOR-XR) 37.5 MG 24 hr capsule TAKE 2 CAPSULES BY MOUTH TWICE A DAY  . zolpidem (AMBIEN) 5 MG tablet Take 1 tablet (5 mg total) by mouth at bedtime as needed for sleep.  . [DISCONTINUED] fludrocortisone (FLORINEF) 0.1 MG tablet Take 0.1 mg by mouth.  . ANDROGEL PUMP 20.25 MG/ACT (1.62%) GEL APPLY 2 PUMPS ONCE A DAY AS DIRECTED (Patient not taking: Reported on 06/22/2014)  . [DISCONTINUED] niacin (NIASPAN) 500 MG CR tablet TAKE 3 TABLETS BY MOUTH EVERY DAY  . [DISCONTINUED] NOVOLOG 100 UNIT/ML injection USE PER PUMP AS DIRECTED   No facility-administered encounter medications on file as of 07/22/2014.      Review of Systems  Constitutional: Negative.   HENT: Negative.   Eyes: Negative.   Respiratory: Negative.   Cardiovascular: Negative.   Gastrointestinal: Negative.   Endocrine: Negative.   Genitourinary: Negative.   Musculoskeletal: Negative.   Skin: Negative.   Allergic/Immunologic: Negative.   Neurological: Negative.   Hematological: Negative.   Psychiatric/Behavioral: Negative.        Objective:   Physical Exam  Constitutional: He is oriented to person, place, and time. He appears well-developed and well-nourished. No distress.  HENT:  Head: Normocephalic and atraumatic.  Right Ear: External ear normal.  Left Ear: External ear normal.  Nose: Nose normal.  Mouth/Throat: Oropharynx is clear and moist. No oropharyngeal exudate.  Eyes: Conjunctivae and EOM are normal. Pupils are equal, round, and reactive to light. Right eye exhibits no discharge. Left eye exhibits no discharge. No scleral icterus.  Neck: Normal range of motion. Neck supple. No thyromegaly present.  Cardiovascular: Normal rate, regular rhythm, normal heart sounds and intact distal pulses.  Exam  reveals no gallop and no friction rub.   No murmur heard. Pulmonary/Chest: Effort normal and breath sounds normal. No respiratory distress. He has no wheezes. He has no rales. He exhibits no tenderness.  Abdominal: Soft. Bowel sounds are normal. He exhibits no mass. There is no tenderness. There is no rebound and no guarding.  Musculoskeletal: Normal range of motion. He exhibits no edema.  Lymphadenopathy:    He has no cervical adenopathy.  Neurological: He is alert and oriented to person, place, and time. He has normal reflexes. No cranial nerve deficit.  Skin: Skin is warm and dry. No rash noted. No erythema. No pallor.  Psychiatric: He has a normal mood and affect. His behavior is normal. Judgment and thought content normal.  Nursing note and vitals reviewed.  BP 107/82 mmHg  Pulse 114  Temp(Src) 97.6 F (36.4 C) (Oral)  Ht _0  (1.778 m)  Wt 202 lb (91.627 kg)  BMI 28.98 kg/m2        Assessment & Plan:   1. DM (diabetes mellitus), type 1, uncontrolled -Patient  monitors his blood sugar closely and his numbers have been good recently and we will check his A1c today to determine what, controlled we have. - POCT CBC - POCT glycosylated hemoglobin (Hb A1C) - BMP8+EGFR - POCT UA - Microalbumin  2. Chronic kidney disease, stage 3, mod decreased GFR -He is followed by the nephrologist regularly - POCT CBC - BMP8+EGFR - Hepatic function panel  3. Hypothyroidism, unspecified hypothyroidism type -He should continue current thyroid treatment pending results of lab work - POCT CBC - Thyroid Panel With TSH  4. Vitamin D deficiency -Continue vitamin D treatment pending results of lab work - POCT CBC - Vit D  25 hydroxy (rtn osteoporosis monitoring)  5. Hyperlipidemia -He should continue with simvastatin pending results of lab work - POCT CBC - Hepatic function panel - Lipid panel  6. HIV disease -Follow up with infectious disease - POCT CBC - Hepatic function panel -  Lipid panel  7. Testosterone deficiency -Continue with testosterone replacement - POCT CBC   Meds ordered this encounter  Medications  . DISCONTD: fludrocortisone (FLORINEF) 0.1 MG tablet    Sig: Take 0.1 mg by mouth.   Patient Instructions  Continue current medications. Continue good therapeutic lifestyle changes which include good diet and exercise. Fall precautions discussed with patient. If an FOBT was given today- please return it to our front desk. If you are over 34 years old - you may need Prevnar 55 or the adult Pneumonia vaccine.  Flu Shots are still available at our office. If you still haven't had one please call to set up a nurse visit to get one.   After your visit with [place] today you will receive a survey in the mail or online from [employer] regarding your care with [place]. Please take a moment to fill this out. Your feedback is very important to [place] as you can help [place] better understand your patient needs as well as improve your experience and satisfaction. WE CARE ABOUT YOU!!!   The patient should follow-up regularly with his nephrologist, his infectious disease doctor, and his pulmonary doctor. He should follow their recommendations and keep these appointments. Monitor blood sugars closely   Arrie Senate MD

## 2014-07-22 NOTE — Addendum Note (Signed)
Addended by: Selmer Dominion on: 07/22/2014 04:44 PM   Modules accepted: Orders

## 2014-07-23 LAB — VITAMIN D 25 HYDROXY (VIT D DEFICIENCY, FRACTURES): Vit D, 25-Hydroxy: 42.3 ng/mL (ref 30.0–100.0)

## 2014-07-23 LAB — LIPID PANEL
Chol/HDL Ratio: 2.6 ratio units (ref 0.0–5.0)
Cholesterol, Total: 146 mg/dL (ref 100–199)
HDL: 56 mg/dL (ref >39)
LDL CALC: 52 mg/dL (ref 0–99)
Triglycerides: 190 mg/dL — ABNORMAL HIGH (ref 0–149)
VLDL Cholesterol Cal: 38 mg/dL (ref 5–40)

## 2014-07-23 LAB — CBC WITH DIFFERENTIAL/PLATELET
Basophils Absolute: 0.1 10*3/uL (ref 0.0–0.2)
Basos: 1 %
EOS (ABSOLUTE): 0.4 10*3/uL (ref 0.0–0.4)
EOS: 4 %
Hematocrit: 42.7 % (ref 37.5–51.0)
Hemoglobin: 15.2 g/dL (ref 12.6–17.7)
IMMATURE GRANS (ABS): 0 10*3/uL (ref 0.0–0.1)
IMMATURE GRANULOCYTES: 0 %
Lymphocytes Absolute: 5.5 10*3/uL — ABNORMAL HIGH (ref 0.7–3.1)
Lymphs: 54 %
MCH: 34.2 pg — ABNORMAL HIGH (ref 26.6–33.0)
MCHC: 35.6 g/dL (ref 31.5–35.7)
MCV: 96 fL (ref 79–97)
MONOCYTES: 11 %
Monocytes Absolute: 1.1 10*3/uL — ABNORMAL HIGH (ref 0.1–0.9)
NEUTROS PCT: 30 %
Neutrophils Absolute: 3 10*3/uL (ref 1.4–7.0)
PLATELETS: 158 10*3/uL (ref 150–379)
RBC: 4.44 x10E6/uL (ref 4.14–5.80)
RDW: 14.4 % (ref 12.3–15.4)
WBC: 10.1 10*3/uL (ref 3.4–10.8)

## 2014-07-23 LAB — MICROALBUMIN, URINE: MICROALBUM., U, RANDOM: 61 ug/mL

## 2014-07-23 LAB — HEPATIC FUNCTION PANEL
ALT: 53 IU/L — AB (ref 0–44)
AST: 44 IU/L — ABNORMAL HIGH (ref 0–40)
Albumin: 4.6 g/dL (ref 3.5–5.5)
Alkaline Phosphatase: 84 IU/L (ref 39–117)
Bilirubin Total: 0.3 mg/dL (ref 0.0–1.2)
Bilirubin, Direct: 0.11 mg/dL (ref 0.00–0.40)
Total Protein: 8.1 g/dL (ref 6.0–8.5)

## 2014-07-23 LAB — BMP8+EGFR
BUN/Creatinine Ratio: 21 — ABNORMAL HIGH (ref 9–20)
BUN: 38 mg/dL — AB (ref 6–24)
CHLORIDE: 96 mmol/L — AB (ref 97–108)
CO2: 26 mmol/L (ref 18–29)
Calcium: 10.3 mg/dL — ABNORMAL HIGH (ref 8.7–10.2)
Creatinine, Ser: 1.84 mg/dL — ABNORMAL HIGH (ref 0.76–1.27)
GFR calc non Af Amer: 43 mL/min/{1.73_m2} — ABNORMAL LOW (ref >59)
GFR, EST AFRICAN AMERICAN: 50 mL/min/{1.73_m2} — AB (ref >59)
GLUCOSE: 166 mg/dL — AB (ref 65–99)
POTASSIUM: 4.8 mmol/L (ref 3.5–5.2)
Sodium: 138 mmol/L (ref 134–144)

## 2014-07-23 LAB — THYROID PANEL WITH TSH
FREE THYROXINE INDEX: 2.6 (ref 1.2–4.9)
T3 Uptake Ratio: 29 % (ref 24–39)
T4, Total: 9.1 ug/dL (ref 4.5–12.0)
TSH: 0.194 u[IU]/mL — ABNORMAL LOW (ref 0.450–4.500)

## 2014-07-24 ENCOUNTER — Other Ambulatory Visit: Payer: Self-pay | Admitting: Family Medicine

## 2014-08-03 ENCOUNTER — Other Ambulatory Visit: Payer: Self-pay | Admitting: Family Medicine

## 2014-08-12 ENCOUNTER — Telehealth: Payer: Self-pay

## 2014-08-12 NOTE — Telephone Encounter (Signed)
[employer] approved the appeal for Androgel Pump

## 2014-08-29 ENCOUNTER — Encounter (HOSPITAL_BASED_OUTPATIENT_CLINIC_OR_DEPARTMENT_OTHER): Payer: BLUE CROSS/BLUE SHIELD

## 2014-08-29 ENCOUNTER — Other Ambulatory Visit: Payer: Self-pay | Admitting: Family Medicine

## 2014-09-01 ENCOUNTER — Other Ambulatory Visit: Payer: Self-pay | Admitting: Family Medicine

## 2014-09-05 ENCOUNTER — Encounter: Payer: Self-pay | Admitting: Internal Medicine

## 2014-09-05 ENCOUNTER — Ambulatory Visit (INDEPENDENT_AMBULATORY_CARE_PROVIDER_SITE_OTHER): Payer: BLUE CROSS/BLUE SHIELD | Admitting: Internal Medicine

## 2014-09-05 VITALS — BP 105/75 | HR 98 | Temp 97.7°F | Ht 70.0 in | Wt 202.0 lb

## 2014-09-05 DIAGNOSIS — B20 Human immunodeficiency virus [HIV] disease: Secondary | ICD-10-CM | POA: Diagnosis not present

## 2014-09-05 DIAGNOSIS — E875 Hyperkalemia: Secondary | ICD-10-CM

## 2014-09-05 DIAGNOSIS — Z23 Encounter for immunization: Secondary | ICD-10-CM | POA: Diagnosis not present

## 2014-09-05 DIAGNOSIS — E038 Other specified hypothyroidism: Secondary | ICD-10-CM

## 2014-09-05 DIAGNOSIS — Z113 Encounter for screening for infections with a predominantly sexual mode of transmission: Secondary | ICD-10-CM

## 2014-09-05 LAB — BASIC METABOLIC PANEL WITH GFR
BUN: 51 mg/dL — ABNORMAL HIGH (ref 7–25)
CALCIUM: 9.8 mg/dL (ref 8.6–10.3)
CO2: 27 mmol/L (ref 20–31)
Chloride: 99 mmol/L (ref 98–110)
Creat: 2.4 mg/dL — ABNORMAL HIGH (ref 0.60–1.35)
GFR, EST AFRICAN AMERICAN: 36 mL/min — AB (ref >=60)
GFR, Est Non African American: 31 mL/min — ABNORMAL LOW (ref >=60)
GLUCOSE: 106 mg/dL — AB (ref 65–99)
Potassium: 4.2 mmol/L (ref 3.5–5.3)
Sodium: 136 mmol/L (ref 135–146)

## 2014-09-05 LAB — TSH: TSH: 0.952 u[IU]/mL (ref 0.350–4.500)

## 2014-09-05 LAB — T4, FREE: FREE T4: 1.1 ng/dL (ref 0.80–1.80)

## 2014-09-05 NOTE — Assessment & Plan Note (Signed)
Doing well on HIV meds.  Not sexually active.  No missed doses.  RTC 4 months.

## 2014-09-05 NOTE — Progress Notes (Signed)
  Subjective:    Patient ID: Patrick Brown, male    DOB: 1970-12-15, 45 y.o.   MRN: [id number]  HPI Is in for followup of HIV.  He continues on [place] and Epzicom. He feels good, though some fatigue.   No longer needs to see pulmonary. He denies any missed doses of his medications.  Last CD4 and viral load reviewed and is 330 and remains undetectable. Creat followed by Dr. Laurance Flatten and last was 1.84, improved.  Also asking if thyroid labs can be done too.     Review of Systems  Constitutional: Negative for fever.  HENT: Negative for sore throat.   Eyes: Negative for visual disturbance.  Respiratory: Negative for shortness of breath.   Gastrointestinal: Negative for nausea, abdominal pain and diarrhea.  Skin: Negative for rash.  Neurological: Negative for dizziness.       Objective:   Physical Exam  Constitutional: He appears well-developed and well-nourished. No distress.  HENT:  Mouth/Throat: No oropharyngeal exudate.  Eyes: No scleral icterus.  Cardiovascular: Normal rate, regular rhythm and normal heart sounds.   No murmur heard. Pulmonary/Chest: Effort normal and breath sounds normal. No respiratory distress.  Lymphadenopathy:    He has no cervical adenopathy.  Skin: No rash noted.          Assessment & Plan:

## 2014-09-05 NOTE — Assessment & Plan Note (Signed)
Due for thyroid check with Dr. Laurance Flatten.  Will do with today's labs.

## 2014-09-05 NOTE — Addendum Note (Signed)
Addended by: Landis Gandy on: 09/05/2014 05:18 PM   Modules accepted: Orders

## 2014-09-05 NOTE — Assessment & Plan Note (Signed)
Will check bmp and creat.  No dosage adjustments of meds.

## 2014-09-06 LAB — T-HELPER CELL (CD4) - (RCID CLINIC ONLY)
CD4 % Helper T Cell: 11 % — ABNORMAL LOW (ref 33–55)
CD4 T Cell Abs: 820 /uL (ref 400–2700)

## 2014-09-06 LAB — RPR

## 2014-09-07 LAB — HIV-1 RNA QUANT-NO REFLEX-BLD
HIV 1 RNA Quant: <20 copies/mL (ref <20)
HIV-1 RNA Quant, Log: <1.3 {Log} (ref <1.30)

## 2014-09-26 ENCOUNTER — Ambulatory Visit (HOSPITAL_BASED_OUTPATIENT_CLINIC_OR_DEPARTMENT_OTHER): Payer: BLUE CROSS/BLUE SHIELD | Attending: Pulmonary Disease

## 2014-09-26 DIAGNOSIS — G4733 Obstructive sleep apnea (adult) (pediatric): Secondary | ICD-10-CM | POA: Diagnosis present

## 2014-09-26 DIAGNOSIS — R0683 Snoring: Secondary | ICD-10-CM | POA: Insufficient documentation

## 2014-09-26 DIAGNOSIS — G4761 Periodic limb movement disorder: Secondary | ICD-10-CM | POA: Insufficient documentation

## 2014-09-28 ENCOUNTER — Telehealth: Payer: Self-pay | Admitting: Pulmonary Disease

## 2014-09-28 NOTE — Telephone Encounter (Signed)
PSG 09/26/14 >> AHI 11.3, SpO2 low 86%, PLMI 51.9  Will have my nurse inform pt that sleep study shows mild sleep apnea.  He needs ROV to review tx options >> can be with me or Tammy Parrett.

## 2014-09-28 NOTE — Progress Notes (Signed)
Patient Name: Patrick Brown, Patrick Brown Date: 09/26/2014 Gender: Male D.O.B: 07-17-1970 Age (years): 42 Referring Provider: Chesley Mires MD, ABSM Height (inches): 70 Interpreting Physician: Chesley Mires MD, ABSM Weight (lbs): 202 RPSGT: Jonna Coup BMI: 29 MRN: [id number] Neck Size: 17.00  CLINICAL INFORMATION Sleep Study Type: NPSG Indication for sleep study: Evaluation of obstructive sleep apnea. Epworth Sleepiness Score: 17.  SLEEP STUDY TECHNIQUE As per the AASM Manual for the Scoring of Sleep and Associated Events v2.3 (April 2016) with a hypopnea requiring 4% desaturations.  The channels recorded and monitored were frontal, central and occipital EEG, electrooculogram (EOG), submentalis EMG (chin), nasal and oral airflow, thoracic and abdominal wall motion, anterior tibialis EMG, snore microphone, electrocardiogram, and pulse oximetry.  MEDICATIONS Patient's medications include: reviewed in electronic medical record. Medications self-administered by patient during sleep study : Sleep medicine administered - Ambien at 10:15:12 PM  SLEEP ARCHITECTURE The study was initiated at 10:44:43 PM and ended at 4:56:22 AM.  Sleep onset time was 86.0 minutes and the sleep efficiency was 60.3%. The total sleep time was 224.0 minutes.  Stage REM latency was 160.0 minutes.  The patient spent 2.68% of the night in stage N1 sleep, 72.99% in stage N2 sleep, 10.27% in stage N3 and 14.06% in REM.  Alpha intrusion was absent.  Supine sleep was 23.19%.  RESPIRATORY PARAMETERS The overall apnea/hypopnea index (AHI) was 11.3 per hour. There were 2 total apneas, including 1 obstructive, 1 central and 0 mixed apneas. There were 40 hypopneas and 0 RERAs.  The AHI during Stage REM sleep was 0.0 per hour.  AHI while supine was 40.4 per hour.  The mean oxygen saturation was 96.84%. The minimum SpO2 during sleep was 86.00%.  Moderate snoring was noted during this study.  CARDIAC DATA The 2 lead  EKG demonstrated sinus rhythm. The mean heart rate was 73.15 beats per minute. Other EKG findings include: None.  LEG MOVEMENT DATA The total PLMS were 194 with a resulting PLMS index of 51.96. Associated arousal with leg movement index was 4.0 .  IMPRESSIONS Mild obstructive sleep apnea occurred during this study (AHI = 11.3/h). No significant central sleep apnea occurred during this study (CAI = 0.3/h). Mild oxygen desaturation was noted during this study (Min O2 = 86.00%). The patient snored with Moderate snoring volume. No cardiac abnormalities were noted during this study. Severe periodic limb movements of sleep occurred during the study. No significant associated arousals.  DIAGNOSIS This study shows mild obstructive sleep apnea.  He also had an increase in his periodic limb movement index.  RECOMMENDATIONS Additional therapies include CPAP, oral appliance, and surgical assessment. Positional therapy avoiding supine position during sleep. Avoid alcohol, sedatives and other CNS depressants that may worsen sleep apnea and disrupt normal sleep architecture. Sleep hygiene should be reviewed to assess factors that may improve sleep quality. Weight management and regular exercise should be initiated or continued if appropriate.  Chesley Mires, MD, [place], American Board of Sleep Medicine 09/28/2014, 11:08 AM  NP1: [id number]

## 2014-09-29 NOTE — Telephone Encounter (Signed)
Called and spoke with pt- aware of PSG results - appt scheduled for 10/03/14 @ 9am. Nothing further needed.

## 2014-10-03 ENCOUNTER — Ambulatory Visit: Payer: BLUE CROSS/BLUE SHIELD | Admitting: Adult Health

## 2014-10-03 DIAGNOSIS — G4733 Obstructive sleep apnea (adult) (pediatric): Secondary | ICD-10-CM | POA: Diagnosis not present

## 2014-10-06 ENCOUNTER — Encounter: Payer: Self-pay | Admitting: Adult Health

## 2014-10-06 ENCOUNTER — Ambulatory Visit (INDEPENDENT_AMBULATORY_CARE_PROVIDER_SITE_OTHER): Payer: BLUE CROSS/BLUE SHIELD | Admitting: Adult Health

## 2014-10-06 VITALS — BP 100/78 | HR 80 | Temp 97.9°F | Ht 70.0 in | Wt 198.0 lb

## 2014-10-06 DIAGNOSIS — G4733 Obstructive sleep apnea (adult) (pediatric): Secondary | ICD-10-CM

## 2014-10-06 NOTE — Patient Instructions (Signed)
Begin CPAP At bedtime   Goal is at least 4-6 hr each night  Work on weight loss.  Do not drive if sleepy.  Download in 1 month  Follow up Dr. Halford Chessman  In 2 months and As needed

## 2014-10-06 NOTE — Progress Notes (Signed)
   Subjective:    Patient ID: Patrick Brown, male    DOB: 23-Jun-1970, 44 y.o.   MRN: [id number]  HPI 44 yo male with HIV , DM , seen for sleep consult in May 2016   TEST  PSG 09/26/14 >> AHI 11.3, SpO2 low 86%, PLMI 51.9  10/06/2014 Follow up : OSA  Pt returns for follow up to discuss sleep results.  He was seen earlier this summer for sleep issues with daytime sleepiness and fatigue.  Has sleep study on 09/26/14  PSG 09/26/14 >> AHI 11.3, SpO2 low 86%, PLMI 51.9 We discussed these results. Along with dx of OSA with treatment options with weight loss,  Oral appliance and CPAP . Discussed Restless leg, he is not aware of this  Does have anemia , suggested he follow up with PCP to make sure under control .  Pt would like to proceed with CPAP for treatment  Denies chest pain, orthopnea or edema    Review of Systems  Constitutional:   No  weight loss, night sweats,  Fevers, chills, + fatigue, or  lassitude.  HEENT:   No headaches,  Difficulty swallowing,  Tooth/dental problems, or  Sore throat,                No sneezing, itching, ear ache, nasal congestion, post nasal drip,   CV:  No chest pain,  Orthopnea, PND, swelling in lower extremities, anasarca, dizziness, palpitations, syncope.   GI  No heartburn, indigestion, abdominal pain, nausea, vomiting, diarrhea, change in bowel habits, loss of appetite, bloody stools.   Resp: No shortness of breath with exertion or at rest.  No excess mucus, no productive cough,  No non-productive cough,  No coughing up of blood.  No change in color of mucus.  No wheezing.  No chest wall deformity  Skin: no rash or lesions.  GU: no dysuria, change in color of urine, no urgency or frequency.  No flank pain, no hematuria   MS:  No joint pain or swelling.  No decreased range of motion.  No back pain.  Psych:  No change in mood or affect. No depression or anxiety.  No memory loss.         Objective:   Physical Exam  GEN: A/Ox3; pleasant , NAD,  well nourished , overweight   HEENT:  [place]/AT,  EACs-clear, TMs-wnl, NOSE-clear, THROAT-clear, no lesions, no postnasal drip or exudate noted. Class 2-3 airway   NECK:  Supple w/ fair ROM; no JVD; normal carotid impulses w/o bruits; no thyromegaly or nodules palpated; no lymphadenopathy.  RESP  Clear  P & A; w/o, wheezes/ rales/ or rhonchi.no accessory muscle use, no dullness to percussion  CARD:  RRR, no m/r/g  , no peripheral edema, pulses intact, no cyanosis or clubbing.  GI:   Soft & nt; nml bowel sounds; no organomegaly or masses detected.  Musco: Warm bil, no deformities or joint swelling noted.   Neuro: alert, no focal deficits noted.    Skin: Warm, no lesions or rashes        Assessment & Plan:

## 2014-10-06 NOTE — Assessment & Plan Note (Signed)
Mild OSA with AHI of 11.3 on PSG    Plan  Begin CPAP At bedtime   Goal is at least 4-6 hr each night  Work on weight loss.  Do not drive if sleepy.  Download in 1 month  Follow up Dr. Halford Chessman  In 2 months and As needed

## 2014-10-07 ENCOUNTER — Other Ambulatory Visit: Payer: Self-pay | Admitting: Family Medicine

## 2014-10-07 NOTE — Progress Notes (Signed)
Reviewed and agree with assessment/plan. 

## 2014-10-12 ENCOUNTER — Other Ambulatory Visit: Payer: Self-pay | Admitting: Family Medicine

## 2014-10-13 ENCOUNTER — Telehealth: Payer: Self-pay | Admitting: Pulmonary Disease

## 2014-10-13 NOTE — Telephone Encounter (Signed)
Called and spoke with patient.  He says that [facility] needs a copy of HST done 09/26/14 Sent message to [facility] so she can pull up HST and complete patient's order. Patient notified.  Nothing further needed.

## 2014-10-13 NOTE — Telephone Encounter (Signed)
Last seen 07/22/14 DWM

## 2014-10-26 ENCOUNTER — Other Ambulatory Visit: Payer: Self-pay | Admitting: Family Medicine

## 2014-10-28 ENCOUNTER — Other Ambulatory Visit: Payer: Self-pay | Admitting: Family Medicine

## 2014-10-28 NOTE — Telephone Encounter (Signed)
Request denied- patient has not been seen since march

## 2014-10-30 ENCOUNTER — Other Ambulatory Visit: Payer: Self-pay | Admitting: Family Medicine

## 2014-10-31 NOTE — Telephone Encounter (Signed)
Last seen 07/22/14  DWM  If approved route to nurse to call into [facility]  [phone]

## 2014-11-07 ENCOUNTER — Other Ambulatory Visit: Payer: Self-pay | Admitting: Family Medicine

## 2014-11-15 ENCOUNTER — Ambulatory Visit (INDEPENDENT_AMBULATORY_CARE_PROVIDER_SITE_OTHER): Payer: BLUE CROSS/BLUE SHIELD | Admitting: Family Medicine

## 2014-11-15 ENCOUNTER — Encounter: Payer: Self-pay | Admitting: Family Medicine

## 2014-11-15 VITALS — BP 96/66 | HR 79 | Temp 97.6°F | Ht 70.0 in | Wt 204.0 lb

## 2014-11-15 DIAGNOSIS — R6510 Systemic inflammatory response syndrome (SIRS) of non-infectious origin without acute organ dysfunction: Secondary | ICD-10-CM | POA: Diagnosis not present

## 2014-11-15 DIAGNOSIS — E1065 Type 1 diabetes mellitus with hyperglycemia: Secondary | ICD-10-CM | POA: Diagnosis not present

## 2014-11-15 DIAGNOSIS — J329 Chronic sinusitis, unspecified: Secondary | ICD-10-CM

## 2014-11-15 DIAGNOSIS — N183 Chronic kidney disease, stage 3 unspecified: Secondary | ICD-10-CM

## 2014-11-15 DIAGNOSIS — E1022 Type 1 diabetes mellitus with diabetic chronic kidney disease: Secondary | ICD-10-CM | POA: Diagnosis not present

## 2014-11-15 DIAGNOSIS — J301 Allergic rhinitis due to pollen: Secondary | ICD-10-CM

## 2014-11-15 DIAGNOSIS — J310 Chronic rhinitis: Secondary | ICD-10-CM

## 2014-11-15 DIAGNOSIS — B20 Human immunodeficiency virus [HIV] disease: Secondary | ICD-10-CM | POA: Diagnosis not present

## 2014-11-15 DIAGNOSIS — R000 Tachycardia, unspecified: Secondary | ICD-10-CM

## 2014-11-15 DIAGNOSIS — IMO0002 Reserved for concepts with insufficient information to code with codable children: Secondary | ICD-10-CM

## 2014-11-15 MED ORDER — DOXYCYCLINE HYCLATE 100 MG PO TABS: 100.0000 mg | ORAL_TABLET | Freq: Two times a day (BID) | ORAL | Status: DC

## 2014-11-15 MED ORDER — FLUTICASONE PROPIONATE 50 MCG/ACT NA SUSP: 2.0000 | Freq: Every day | NASAL | Status: DC

## 2014-11-15 MED ORDER — TESTOSTERONE 20.25 MG/ACT (1.62%) TD GEL: TRANSDERMAL | Status: DC

## 2014-11-15 NOTE — Patient Instructions (Signed)
The patient should drink plenty of fluids He should take Tylenol for aches pains and fever He should use nasal saline frequently during the day and use Flonase 1-2 sprays each nostril at bedtime He should take Mucinex one twice daily for cough and congestion as needed with a large glass of water He should take the antibiotic until completed

## 2014-11-15 NOTE — Progress Notes (Signed)
Subjective:    Patient ID: Patrick Brown, male    DOB: [birth year], 44 y.o.   MRN: [id number]  HPI Patient here today for bilateral ear pain and fullness. This has been going on quite a while and he has already taken a round of Augmentin for this. The patient denies any fever associated with this. He says his ears feel full and stopped up and uncomfortable. He is had minimal cough.       Patient Active Problem List   Diagnosis Date Noted  . OSA (obstructive sleep apnea) 10/06/2014  . Screening examination for venereal disease 12/23/2013  . Pain in joint, lower leg 11/19/2012  . Unspecified sinusitis (chronic) 09/16/2012  . Pneumocystis carinii pneumonia ([place]) 08/19/2012  . Itch 08/17/2012  . Hypercalcemia 08/09/2012  . Right groin wound 08/09/2012  . Hyperkalemia 08/07/2012  . Acute respiratory failure with hypoxia ([place]) 08/05/2012  . Hyponatremia 08/05/2012  . HCAP (healthcare-associated pneumonia) 08/05/2012  . SOB (shortness of breath) 07/14/2012  . HIV disease ([place]) 06/02/2012  . Convulsions/seizures ([place]) 05/16/2012  . SIRS (systemic inflammatory response syndrome) ([place]) 04/18/2012  . Pulmonary infiltrate 04/18/2012  . Gastroparesis 04/18/2012  . Sinus tachycardia ([place]) 04/18/2012  . Anemia, iron deficiency   . DM (diabetes mellitus), type 1, uncontrolled ([place]) 05/08/2011  . Hypothyroid 05/08/2011  . Chronic kidney disease, stage 3, mod decreased GFR 12/31/2010   Outpatient Encounter Prescriptions as of 11/15/2014  Medication Sig  . abacavir-lamiVUDine (EPZICOM) 600-300 MG per tablet TAKE ONE TABLET (600MG -300MG ) BY MOUTH ONCE DAILY AT BEDTIME. STORE ATCONTROLLED ROOM TEMPERATURE.  Marland Kitchen acetaminophen (TYLENOL) 500 MG tablet Take 1,000 mg by mouth 2 (two) times daily.   Marland Kitchen acyclovir (ZOVIRAX) 400 MG tablet TAKE 1 TABLET (400 MG TOTAL) BY MOUTH 2 (TWO) TIMES DAILY.  Marland Kitchen amoxicillin-clavulanate (AUGMENTIN) 875-125 MG per tablet TAKE 1 TABLET BY MOUTH 2 (TWO) TIMES DAILY.  Marland Kitchen  ANDROGEL PUMP 20.25 MG/ACT (1.62%) GEL APPLY 2 PUMPS ONCE DAILY AS DIRECTED  . aspirin 81 MG chewable tablet Chew 81 mg by mouth every morning.  . colchicine 0.6 MG tablet Take 0.6 mg by mouth as needed.  . dolutegravir (TIVICAY) 50 MG tablet TAKE ONE TABLET (50MG ) BY MOUTH ONCE DAILY AT BEDTIME. STORE AT CONTROLLED ROOM TEMPERATURE.  Marland Kitchen esomeprazole (NEXIUM) 40 MG capsule TAKE ONE CAPSULE BY MOUTH DAILY  . ferrous sulfate 325 (65 FE) MG tablet Take 650 mg by mouth daily with breakfast.   . fluconazole (DIFLUCAN) 200 MG tablet Take 200 mg by mouth daily as needed.   . fludrocortisone (FLORINEF) 0.1 MG tablet Take 0.2 mg by mouth daily.  . furosemide (LASIX) 40 MG tablet Take 20 mg by mouth 2 (two) times daily.  . Glucosamine-Chondroit-Vit C-Mn (GLUCOSAMINE 1500 COMPLEX PO) Take 1 tablet by mouth 2 (two) times daily.   . hydrOXYzine (ATARAX/VISTARIL) 25 MG tablet TAKE 1 TABLET (25 MG TOTAL) BY MOUTH EVERY 6 (SIX) HOURS AS NEEDED FOR ITCHING.  Marland Kitchen levothyroxine (SYNTHROID, LEVOTHROID) 175 MCG tablet Take 175 mcg by mouth daily before breakfast.  . loratadine (CLARITIN) 10 MG tablet Take 10 mg by mouth daily as needed for allergies.  Marland Kitchen LORazepam (ATIVAN) 0.5 MG tablet TAKE 1 TABLET BY MOUTH EVERY DAY AS NEEDED  . metoCLOPramide (REGLAN) 5 MG tablet TAKE 1 TABLET BY MOUTH 3 TIMES A DAY BEFORE MEALS  . niacin (NIASPAN) 500 MG CR tablet TAKE 3 TABLETS BY MOUTH EVERY DAY  . NOVOLOG 100 UNIT/ML injection USE PER PUMP AS DIRECTED  . omega-3  acid ethyl esters (LOVAZA) 1 G capsule TAKE 2 CAPSULES (2 G TOTAL) BY MOUTH 2 (TWO) TIMES DAILY.  Marland Kitchen ondansetron (ZOFRAN) 4 MG tablet Take 1 tablet (4 mg total) by mouth every 8 (eight) hours as needed for nausea.  Marland Kitchen OVER THE COUNTER MEDICATION Take 1 capsule by mouth daily. [name]- probiotic daily  . simvastatin (ZOCOR) 40 MG tablet TAKE 1 TABLET (40 MG TOTAL) BY MOUTH AT BEDTIME.  . sodium bicarbonate 650 MG tablet Take 650 mg by mouth every morning.   . traMADol  (ULTRAM) 50 MG tablet TAKE 1 TABLET BY MOUTH EVERY 6 HOURS AS NEEDED FOR PAIN  . ULORIC 40 MG tablet TAKE 1 TABLET BY MOUTH EVERY DAY  . valACYclovir (VALTREX) 500 MG tablet Take 500 mg by mouth 2 (two) times daily.  Marland Kitchen venlafaxine XR (EFFEXOR-XR) 37.5 MG 24 hr capsule TAKE 2 CAPSULES BY MOUTH TWICE A DAY  . zolpidem (AMBIEN) 5 MG tablet Take 1 tablet (5 mg total) by mouth at bedtime as needed for sleep.  . [DISCONTINUED] furosemide (LASIX) 20 MG tablet Take 20 mg by mouth 2 (two) times daily.   No facility-administered encounter medications on file as of 11/15/2014.      Review of Systems  Constitutional: Negative.   HENT: Positive for congestion, ear pain (bilateral) and postnasal drip.   Eyes: Negative.   Respiratory: Negative.   Cardiovascular: Negative.   Gastrointestinal: Negative.   Endocrine: Negative.   Genitourinary: Negative.   Musculoskeletal: Negative.   Skin: Negative.   Allergic/Immunologic: Negative.   Neurological: Negative.   Hematological: Negative.   Psychiatric/Behavioral: Negative.        Objective:   Physical Exam  Constitutional: He is oriented to person, place, and time. He appears well-developed and well-nourished. No distress.  HENT:  Head: Normocephalic and atraumatic.  Right Ear: External ear normal.  Left Ear: External ear normal.  Nose: Nose normal.  Mouth/Throat: Oropharynx is clear and moist.  There is frontal sinus tenderness  Eyes: Conjunctivae and EOM are normal. Pupils are equal, round, and reactive to light. Right eye exhibits no discharge. Left eye exhibits no discharge. No scleral icterus.  Neck: Normal range of motion. Neck supple. No thyromegaly present.  Cardiovascular: Normal rate, regular rhythm and normal heart sounds.   No murmur heard. Pulmonary/Chest: Effort normal and breath sounds normal. He has no wheezes. He has no rales.  Musculoskeletal: Normal range of motion.  Lymphadenopathy:    He has no cervical adenopathy.    Neurological: He is alert and oriented to person, place, and time.  Skin: Skin is warm and dry. No rash noted.  Psychiatric: He has a normal mood and affect. His behavior is normal. Judgment and thought content normal.  Vitals reviewed.  BP 96/66 mmHg  Pulse 79  Temp(Src) 97.6 F (36.4 C) (Oral)  Ht 5\' 10"  (1.778 m)  Wt 204 lb (92.534 kg)  BMI 29.27 kg/m2        Assessment & Plan:  1. SIRS (systemic inflammatory response syndrome) (HCC) -Continue to follow-up with infectious disease  2. Sinus tachycardia ([place]) -The heart had a regular rate and rhythm today  3. HIV disease ([place]) -10 you follow-up with infectious disease  4. Uncontrolled type 1 diabetes mellitus with stage 3 chronic kidney disease ([place]) -Continue monitoring closely  5. Chronic kidney disease, stage 3, mod decreased GFR -Kidney to follow-up with nephrology  6. Rhinosinusitis - doxycycline (VIBRA-TABS) 100 MG tablet; Take 1 tablet (100 mg total) by mouth 2 (  two) times daily.  Dispense: 20 tablet; Refill: 0  7. Allergic rhinitis due to pollen - fluticasone (FLONASE) 50 MCG/ACT nasal spray; Place 2 sprays into both nostrils daily.  Dispense: 16 g; Refill: 6  Meds ordered this encounter  Medications  . furosemide (LASIX) 40 MG tablet    Sig: Take 20 mg by mouth 2 (two) times daily.    Refill:  4  . Testosterone (ANDROGEL PUMP) 20.25 MG/ACT (1.62%) GEL    Sig: APPLY 2 PUMPS ONCE DAILY AS DIRECTED    Dispense:  75 g    Refill:  3    This request is for a new prescription for a controlled substance as required by Federal/State law.  . fluticasone (FLONASE) 50 MCG/ACT nasal spray    Sig: Place 2 sprays into both nostrils daily.    Dispense:  16 g    Refill:  6  . doxycycline (VIBRA-TABS) 100 MG tablet    Sig: Take 1 tablet (100 mg total) by mouth 2 (two) times daily.    Dispense:  20 tablet    Refill:  0     Patient Instructions  The patient should drink plenty of fluids He should take Tylenol for  aches pains and fever He should use nasal saline frequently during the day and use Flonase 1-2 sprays each nostril at bedtime He should take Mucinex one twice daily for cough and congestion as needed with a large glass of water He should take the antibiotic until completed   Arrie Senate MD

## 2014-11-22 ENCOUNTER — Telehealth: Payer: Self-pay

## 2014-11-22 NOTE — Telephone Encounter (Signed)
To get Niacin prior authorized I need to give clinical rationale for 3 pills a day  Thank you

## 2014-11-22 NOTE — Telephone Encounter (Signed)
Low HDL

## 2014-11-24 ENCOUNTER — Telehealth: Payer: Self-pay

## 2014-11-24 ENCOUNTER — Other Ambulatory Visit: Payer: Self-pay

## 2014-11-24 MED ORDER — NIACIN ER (ANTIHYPERLIPIDEMIC) 1000 MG PO TBCR: 1000.0000 mg | EXTENDED_RELEASE_TABLET | Freq: Every day | ORAL | Status: DC

## 2014-11-24 NOTE — Telephone Encounter (Signed)
Try Niaspan 1000 1 daily

## 2014-11-24 NOTE — Telephone Encounter (Signed)
Insurance denied Niaspan ER 500 mg 3 pills a day.  The Medication is approved for once daily

## 2014-11-28 ENCOUNTER — Ambulatory Visit (INDEPENDENT_AMBULATORY_CARE_PROVIDER_SITE_OTHER): Payer: BLUE CROSS/BLUE SHIELD | Admitting: Family Medicine

## 2014-11-28 ENCOUNTER — Encounter: Payer: Self-pay | Admitting: Family Medicine

## 2014-11-28 VITALS — BP 121/80 | HR 91 | Temp 97.5°F | Ht 70.0 in | Wt 204.2 lb

## 2014-11-28 DIAGNOSIS — E875 Hyperkalemia: Secondary | ICD-10-CM | POA: Diagnosis not present

## 2014-11-28 DIAGNOSIS — N183 Chronic kidney disease, stage 3 unspecified: Secondary | ICD-10-CM

## 2014-11-28 DIAGNOSIS — E785 Hyperlipidemia, unspecified: Secondary | ICD-10-CM

## 2014-11-28 DIAGNOSIS — E1022 Type 1 diabetes mellitus with diabetic chronic kidney disease: Secondary | ICD-10-CM

## 2014-11-28 DIAGNOSIS — E1065 Type 1 diabetes mellitus with hyperglycemia: Secondary | ICD-10-CM | POA: Diagnosis not present

## 2014-11-28 DIAGNOSIS — B20 Human immunodeficiency virus [HIV] disease: Secondary | ICD-10-CM | POA: Diagnosis not present

## 2014-11-28 DIAGNOSIS — E039 Hypothyroidism, unspecified: Secondary | ICD-10-CM

## 2014-11-28 DIAGNOSIS — E559 Vitamin D deficiency, unspecified: Secondary | ICD-10-CM

## 2014-11-28 DIAGNOSIS — IMO0002 Reserved for concepts with insufficient information to code with codable children: Secondary | ICD-10-CM

## 2014-11-28 DIAGNOSIS — Z23 Encounter for immunization: Secondary | ICD-10-CM | POA: Diagnosis not present

## 2014-11-28 LAB — GLUCOSE, POCT (MANUAL RESULT ENTRY): POC GLUCOSE: 409 mg/dL — AB (ref 70–99)

## 2014-11-28 NOTE — Patient Instructions (Signed)
The patient should continue to follow-up with his endocrinologist his nephrologist's infectious disease doctor and his ophthalmologist He should monitor his blood sugars closely and stay as active physically as possible He will receive the flu vaccine today He will return to the office for his fasting blood work We will do a blood sugar today.

## 2014-11-28 NOTE — Progress Notes (Signed)
Subjective:    Patient ID: Patrick Brown, male    DOB: 12/20/1970, 44 y.o.   MRN: [id number]  HPI Patient is here today for a 4 month follow up. This patient has a history of diabetes mellitus insulin-dependent, chronic kidney disease, HIV disease, and recurrent bouts of pneumonia. He will return for his routine lab work. He is due to receive the Prevnar vaccine and the flu shot. He is also due to get a microalbumin. The patient does follow-up regularly with his endocrinologist, his infectious disease doctor, his nephrologist and his ophthalmologist. His chest pain shortness of breath trouble swallowing heartburn and indigestion nausea vomiting diarrhea or blood in his stool. He is passing his water without problems. It is important to note that the patient had a stress test this summer because of his diagnosis of diabetes and other than being out of shape no further recommendations were made and no abnormalities were detected. The patient seems to be in good spirits and ask the looks as good as I have seen him up over the past several years.   Review of Systems  Constitutional: Negative.   HENT: Negative.   Eyes: Negative.   Respiratory: Negative.   Cardiovascular: Negative.   Gastrointestinal: Negative.   Endocrine: Negative.   Genitourinary: Negative.   Musculoskeletal: Negative.   Skin: Negative.   Allergic/Immunologic: Negative.   Neurological: Negative.   Hematological: Negative.   Psychiatric/Behavioral: Negative.         Patient Active Problem List   Diagnosis Date Noted  . OSA (obstructive sleep apnea) 10/06/2014  . Screening examination for venereal disease 12/23/2013  . Pain in joint, lower leg 11/19/2012  . Unspecified sinusitis (chronic) 09/16/2012  . Itch 08/17/2012  . Hypercalcemia 08/09/2012  . Right groin wound 08/09/2012  . Hyperkalemia 08/07/2012  . Hyponatremia 08/05/2012  . SOB (shortness of breath) 07/14/2012  . HIV disease ([place]) 06/02/2012  .  Convulsions/seizures ([place]) 05/16/2012  . Pulmonary infiltrate 04/18/2012  . Gastroparesis 04/18/2012  . Anemia, iron deficiency   . DM (diabetes mellitus), type 1, uncontrolled ([place]) 05/08/2011  . Hypothyroid 05/08/2011  . Chronic kidney disease, stage 3, mod decreased GFR 12/31/2010   Outpatient Encounter Prescriptions as of 11/28/2014  Medication Sig  . abacavir-lamiVUDine (EPZICOM) 600-300 MG per tablet TAKE ONE TABLET (600MG -300MG ) BY MOUTH ONCE DAILY AT BEDTIME. STORE ATCONTROLLED ROOM TEMPERATURE.  Marland Kitchen acetaminophen (TYLENOL) 500 MG tablet Take 1,000 mg by mouth 2 (two) times daily.   Marland Kitchen acyclovir (ZOVIRAX) 400 MG tablet TAKE 1 TABLET (400 MG TOTAL) BY MOUTH 2 (TWO) TIMES DAILY.  Marland Kitchen aspirin 81 MG chewable tablet Chew 81 mg by mouth every morning.  . colchicine 0.6 MG tablet Take 0.6 mg by mouth as needed.  . dolutegravir (TIVICAY) 50 MG tablet TAKE ONE TABLET (50MG ) BY MOUTH ONCE DAILY AT BEDTIME. STORE AT CONTROLLED ROOM TEMPERATURE.  Marland Kitchen esomeprazole (NEXIUM) 40 MG capsule TAKE ONE CAPSULE BY MOUTH DAILY  . ferrous sulfate 325 (65 FE) MG tablet Take 650 mg by mouth daily with breakfast.   . fluconazole (DIFLUCAN) 200 MG tablet Take 200 mg by mouth daily as needed.   . fludrocortisone (FLORINEF) 0.1 MG tablet Take 0.2 mg by mouth daily.  . fluticasone (FLONASE) 50 MCG/ACT nasal spray Place 2 sprays into both nostrils daily.  . furosemide (LASIX) 40 MG tablet Take 20 mg by mouth 2 (two) times daily.  . Glucosamine-Chondroit-Vit C-Mn (GLUCOSAMINE 1500 COMPLEX PO) Take 1 tablet by mouth 2 (two) times daily.   Marland Kitchen  hydrOXYzine (ATARAX/VISTARIL) 25 MG tablet TAKE 1 TABLET (25 MG TOTAL) BY MOUTH EVERY 6 (SIX) HOURS AS NEEDED FOR ITCHING.  Marland Kitchen levothyroxine (SYNTHROID, LEVOTHROID) 175 MCG tablet Take 175 mcg by mouth daily before breakfast.  . loratadine (CLARITIN) 10 MG tablet Take 10 mg by mouth daily as needed for allergies.  Marland Kitchen LORazepam (ATIVAN) 0.5 MG tablet TAKE 1 TABLET BY MOUTH EVERY DAY AS  NEEDED  . metoCLOPramide (REGLAN) 5 MG tablet TAKE 1 TABLET BY MOUTH 3 TIMES A DAY BEFORE MEALS  . niacin (NIASPAN) 500 MG CR tablet TAKE 3 TABLETS BY MOUTH EVERY DAY  . NOVOLOG 100 UNIT/ML injection USE PER PUMP AS DIRECTED  . omega-3 acid ethyl esters (LOVAZA) 1 G capsule TAKE 2 CAPSULES (2 G TOTAL) BY MOUTH 2 (TWO) TIMES DAILY.  Marland Kitchen ondansetron (ZOFRAN) 4 MG tablet Take 1 tablet (4 mg total) by mouth every 8 (eight) hours as needed for nausea.  Marland Kitchen OVER THE COUNTER MEDICATION Take 1 capsule by mouth daily. [name]- probiotic daily  . simvastatin (ZOCOR) 40 MG tablet TAKE 1 TABLET (40 MG TOTAL) BY MOUTH AT BEDTIME.  . sodium bicarbonate 650 MG tablet Take 650 mg by mouth every morning.   . Testosterone (ANDROGEL PUMP) 20.25 MG/ACT (1.62%) GEL APPLY 2 PUMPS ONCE DAILY AS DIRECTED  . traMADol (ULTRAM) 50 MG tablet TAKE 1 TABLET BY MOUTH EVERY 6 HOURS AS NEEDED FOR PAIN  . ULORIC 40 MG tablet TAKE 1 TABLET BY MOUTH EVERY DAY  . valACYclovir (VALTREX) 500 MG tablet Take 500 mg by mouth 2 (two) times daily.  Marland Kitchen venlafaxine XR (EFFEXOR-XR) 37.5 MG 24 hr capsule TAKE 2 CAPSULES BY MOUTH TWICE A DAY  . zolpidem (AMBIEN) 5 MG tablet Take 1 tablet (5 mg total) by mouth at bedtime as needed for sleep.  . [DISCONTINUED] amoxicillin-clavulanate (AUGMENTIN) 875-125 MG per tablet TAKE 1 TABLET BY MOUTH 2 (TWO) TIMES DAILY. (Patient not taking: Reported on 11/28/2014)  . [DISCONTINUED] doxycycline (VIBRA-TABS) 100 MG tablet Take 1 tablet (100 mg total) by mouth 2 (two) times daily.  . [DISCONTINUED] niacin (NIASPAN) 1000 MG CR tablet Take 1 tablet (1,000 mg total) by mouth at bedtime.   No facility-administered encounter medications on file as of 11/28/2014.      Objective:   Physical Exam  Constitutional: He is oriented to person, place, and time. He appears well-developed and well-nourished. No distress.  HENT:  Head: Normocephalic and atraumatic.  Right Ear: External ear normal.  Left Ear: External ear  normal.  Nose: Nose normal.  Mouth/Throat: Oropharynx is clear and moist. No oropharyngeal exudate.  Eyes: Conjunctivae and EOM are normal. Pupils are equal, round, and reactive to light. Right eye exhibits no discharge. Left eye exhibits no discharge. No scleral icterus.  Neck: Normal range of motion. Neck supple. No thyromegaly present.  Without bruits thyromegaly or adenopathy  Cardiovascular: Normal rate, regular rhythm, normal heart sounds and intact distal pulses.   No murmur heard. Pulmonary/Chest: Effort normal and breath sounds normal. No respiratory distress. He has no wheezes. He has no rales. He exhibits no tenderness.  No axillary adenopathy Chest clear anteriorly and posteriorly  Abdominal: Soft. Bowel sounds are normal. He exhibits no mass. There is no tenderness. There is no rebound and no guarding.  No abdominal tenderness masses or inguinal adenopathy  Musculoskeletal: Normal range of motion. He exhibits no edema.  Lymphadenopathy:    He has no cervical adenopathy.  Neurological: He is alert and oriented to person, place, and  time. He has normal reflexes. No cranial nerve deficit.  Skin: Skin is warm and dry. No rash noted.  Psychiatric: He has a normal mood and affect. His behavior is normal. Judgment and thought content normal.  Nursing note and vitals reviewed.  BP 121/80 mmHg  Pulse 91  Temp(Src) 97.5 F (36.4 C) (Oral)  Ht 5\' 10"  (1.778 m)  Wt 204 lb 3.2 oz (92.625 kg)  BMI 29.30 kg/m2  An isolated blood sugar today was 402 during office visit. The patient will program his pump to get extra insulin now. He will call us if this does not come under better control.      Assessment & Plan:  1. Uncontrolled type 1 diabetes mellitus with stage 3 chronic kidney disease (HCC) -Generally speaking the patient says that his blood pressures fasting usually run in the 120-140 range. His 2 hours after eating blood sugars are in the 150-160 range. - POCT glucose (manual  entry)  2. Chronic kidney disease, stage 3, mod decreased GFR -He should continue to follow-up with the nephrologist every 6 months as he is doing  3. Hypothyroidism, unspecified hypothyroidism type -Continue current treatment pending results of lab work  4. Hyperlipidemia -Continue current treatment pending results of lab work  5. Vitamin D deficiency -The patient is currently not taking any vitamin D because of elevated creatinine.  6. Hyperkalemia -Lab work today and continue to follow-up with nephrologist  7. HIV -Continue to follow-up with infectious disease  No orders of the defined types were placed in this encounter.   Patient Instructions  The patient should continue to follow-up with his endocrinologist his nephrologist's infectious disease doctor and his ophthalmologist He should monitor his blood sugars closely and stay as active physically as possible He will receive the flu vaccine today He will return to the office for his fasting blood work We will do a blood sugar today.   Arrie Senate MD

## 2014-12-01 ENCOUNTER — Other Ambulatory Visit: Payer: Self-pay | Admitting: *Deleted

## 2014-12-01 ENCOUNTER — Other Ambulatory Visit: Payer: Self-pay | Admitting: Family Medicine

## 2014-12-01 DIAGNOSIS — B20 Human immunodeficiency virus [HIV] disease: Secondary | ICD-10-CM

## 2014-12-01 MED ORDER — ABACAVIR SULFATE-LAMIVUDINE 600-300 MG PO TABS: ORAL_TABLET | ORAL | Status: DC

## 2014-12-01 MED ORDER — DOLUTEGRAVIR SODIUM 50 MG PO TABS: ORAL_TABLET | ORAL | Status: DC

## 2014-12-06 ENCOUNTER — Encounter: Payer: Self-pay | Admitting: Pulmonary Disease

## 2014-12-06 ENCOUNTER — Ambulatory Visit (INDEPENDENT_AMBULATORY_CARE_PROVIDER_SITE_OTHER): Payer: BLUE CROSS/BLUE SHIELD | Admitting: Pulmonary Disease

## 2014-12-06 VITALS — BP 120/80 | HR 101 | Ht 70.0 in | Wt 205.2 lb

## 2014-12-06 DIAGNOSIS — Z9989 Dependence on other enabling machines and devices: Principal | ICD-10-CM

## 2014-12-06 DIAGNOSIS — G4733 Obstructive sleep apnea (adult) (pediatric): Secondary | ICD-10-CM | POA: Diagnosis not present

## 2014-12-06 NOTE — Patient Instructions (Signed)
Follow up in 1 year.

## 2014-12-06 NOTE — Progress Notes (Signed)
Chief Complaint  Patient presents with  . Follow-up    Pt following for OSA: pt states he is doing well, he like his machine. pt using CPAP everynight for about 6 hours. mask and pressure good for pt. DME: [facility]    History of Present Illness: Patrick Brown is a 44 y.o. male with OSA.  He has been sleeping better since starting CPAP.  He is more energetic during the day.  He uses CPAP for about 7 hrs per night.  He will usually sleep for more time at, and does take naps >> doesn't use CPAP then.  He has not problem with mask fit.  TESTS: PSG 09/26/14 >> AHI 11.3, SpO2 low 86%, PLMI 51.9 Auto CPAP 11/05/14 to 12/04/14 >> used on 30 of 30 nights with average 6 hrs and 50 min.  Average AHI is 4.1 with median CPAP 7 cm H2O and 95 th percentile CPAP 10 cm H20.   PMhx >> DM with retinopathy, HLD, Anemia, Hypothyroidism, CKD, Depression, Gastroparesis, GERD, HIV  Past surgical hx, Allergies, Family hx, Social hx all reviewed.   Physical Exam: BP 120/80 mmHg  Pulse 101  Ht 5\' 10"  (1.778 m)  Wt 205 lb 3.2 oz (93.078 kg)  BMI 29.44 kg/m2  SpO2 99%  General - No distress ENT - No sinus tenderness, no oral exudate, no LAN, 1+ tonsils, MP 4 Cardiac - s1s2 regular, no murmur Chest - No wheeze/rales/dullness Back - No focal tenderness Abd - Soft, non-tender Ext - No edema Neuro - Normal strength Skin - No rashes Psych - normal mood, and behavior   Assessment/Plan:  Obstructive sleep apnea. He is compliant with therapy and reports benefit. Plan: - advised him to use CPAP whenever he is asleep - continue auto CPAP   Outpatient Encounter Prescriptions as of 12/06/2014  Medication Sig Note  . abacavir-lamiVUDine (EPZICOM) 600-300 MG tablet TAKE ONE TABLET (600MG -300MG ) BY MOUTH ONCE DAILY AT BEDTIME. STORE ATCONTROLLED ROOM TEMPERATURE.   Marland Kitchen acetaminophen (TYLENOL) 500 MG tablet Take 1,000 mg by mouth 2 (two) times daily.  10/28/2012: .  . acyclovir (ZOVIRAX) 400 MG tablet TAKE 1 TABLET  (400 MG TOTAL) BY MOUTH 2 (TWO) TIMES DAILY. 09/05/2014: Received from: [facility]  . aspirin 81 MG chewable tablet Chew 81 mg by mouth every morning.   . colchicine 0.6 MG tablet Take 0.6 mg by mouth as needed.   . dolutegravir (TIVICAY) 50 MG tablet TAKE ONE TABLET (50MG ) BY MOUTH ONCE DAILY AT BEDTIME. STORE AT CONTROLLED ROOM TEMPERATURE.   Marland Kitchen esomeprazole (NEXIUM) 40 MG capsule TAKE ONE CAPSULE BY MOUTH DAILY   . ferrous sulfate 325 (65 FE) MG tablet Take 650 mg by mouth daily with breakfast.    . fluconazole (DIFLUCAN) 200 MG tablet Take 200 mg by mouth daily as needed.  09/05/2014: "just in case"  . fludrocortisone (FLORINEF) 0.1 MG tablet Take 0.2 mg by mouth daily.   . fluticasone (FLONASE) 50 MCG/ACT nasal spray Place 2 sprays into both nostrils daily.   . furosemide (LASIX) 40 MG tablet Take 20 mg by mouth 2 (two) times daily. 11/15/2014: Received from: External Pharmacy  . Glucosamine-Chondroit-Vit C-Mn (GLUCOSAMINE 1500 COMPLEX PO) Take 1 tablet by mouth 2 (two) times daily.    . hydrOXYzine (ATARAX/VISTARIL) 25 MG tablet TAKE 1 TABLET (25 MG TOTAL) BY MOUTH EVERY 6 (SIX) HOURS AS NEEDED FOR ITCHING.   Marland Kitchen levothyroxine (SYNTHROID, LEVOTHROID) 175 MCG tablet Take 175 mcg by mouth daily before breakfast.   .  loratadine (CLARITIN) 10 MG tablet Take 10 mg by mouth daily as needed for allergies.   Marland Kitchen LORazepam (ATIVAN) 0.5 MG tablet TAKE 1 TABLET BY MOUTH EVERY DAY AS NEEDED   . metoCLOPramide (REGLAN) 5 MG tablet TAKE 1 TABLET BY MOUTH 3 TIMES A DAY BEFORE MEALS   . niacin (NIASPAN) 500 MG CR tablet TAKE 3 TABLETS BY MOUTH EVERY DAY 09/05/2014: Held due to insurance - needs Prior Authorization  . NOVOLOG 100 UNIT/ML injection USE PER PUMP AS DIRECTED   . omega-3 acid ethyl esters (LOVAZA) 1 G capsule TAKE 2 CAPSULES (2 G TOTAL) BY MOUTH 2 (TWO) TIMES DAILY.   Marland Kitchen OVER THE COUNTER MEDICATION Take 1 capsule by mouth daily. [name]- probiotic daily   . simvastatin (ZOCOR) 40 MG  tablet TAKE 1 TABLET (40 MG TOTAL) BY MOUTH AT BEDTIME.   . sodium bicarbonate 650 MG tablet Take 650 mg by mouth every morning.    . Testosterone (ANDROGEL PUMP) 20.25 MG/ACT (1.62%) GEL APPLY 2 PUMPS ONCE DAILY AS DIRECTED   . traMADol (ULTRAM) 50 MG tablet TAKE 1 TABLET BY MOUTH EVERY 6 HOURS AS NEEDED FOR PAIN   . ULORIC 40 MG tablet TAKE 1 TABLET BY MOUTH EVERY DAY   . venlafaxine XR (EFFEXOR-XR) 37.5 MG 24 hr capsule TAKE 2 CAPSULES BY MOUTH TWICE A DAY   . zolpidem (AMBIEN) 5 MG tablet Take 1 tablet (5 mg total) by mouth at bedtime as needed for sleep.   Marland Kitchen ondansetron (ZOFRAN) 4 MG tablet Take 1 tablet (4 mg total) by mouth every 8 (eight) hours as needed for nausea.   . [DISCONTINUED] fluconazole (DIFLUCAN) 200 MG tablet TAKE 1 TABLET (200 MG TOTAL) BY MOUTH DAILY. (Patient not taking: Reported on 12/06/2014)   . [DISCONTINUED] valACYclovir (VALTREX) 500 MG tablet Take 500 mg by mouth 2 (two) times daily. 09/05/2014: For his eye   No facility-administered encounter medications on file as of 12/06/2014.    Chesley Mires, MD [facility] Pager:  [phone]

## 2014-12-13 ENCOUNTER — Other Ambulatory Visit: Payer: BLUE CROSS/BLUE SHIELD

## 2014-12-13 DIAGNOSIS — E559 Vitamin D deficiency, unspecified: Secondary | ICD-10-CM

## 2014-12-13 DIAGNOSIS — N183 Chronic kidney disease, stage 3 unspecified: Secondary | ICD-10-CM

## 2014-12-13 DIAGNOSIS — E785 Hyperlipidemia, unspecified: Secondary | ICD-10-CM

## 2014-12-13 DIAGNOSIS — E039 Hypothyroidism, unspecified: Secondary | ICD-10-CM

## 2014-12-13 DIAGNOSIS — IMO0002 Reserved for concepts with insufficient information to code with codable children: Secondary | ICD-10-CM

## 2014-12-13 DIAGNOSIS — E1065 Type 1 diabetes mellitus with hyperglycemia: Principal | ICD-10-CM

## 2014-12-13 DIAGNOSIS — E1022 Type 1 diabetes mellitus with diabetic chronic kidney disease: Secondary | ICD-10-CM

## 2014-12-13 NOTE — Progress Notes (Signed)
Lab only 

## 2014-12-14 LAB — HEPATIC FUNCTION PANEL
ALBUMIN: 4.2 g/dL (ref 3.5–5.5)
ALT: 38 IU/L (ref 0–44)
AST: 36 IU/L (ref 0–40)
Alkaline Phosphatase: 94 IU/L (ref 39–117)
BILIRUBIN, DIRECT: 0.15 mg/dL (ref 0.00–0.40)
Bilirubin Total: 0.4 mg/dL (ref 0.0–1.2)
Total Protein: 7.1 g/dL (ref 6.0–8.5)

## 2014-12-14 LAB — CBC WITH DIFFERENTIAL/PLATELET
Basophils Absolute: 0.1 10*3/uL (ref 0.0–0.2)
Basos: 1 %
EOS (ABSOLUTE): 0.3 10*3/uL (ref 0.0–0.4)
EOS: 4 %
HEMATOCRIT: 38.5 % (ref 37.5–51.0)
HEMOGLOBIN: 13 g/dL (ref 12.6–17.7)
Immature Grans (Abs): 0.1 10*3/uL (ref 0.0–0.1)
Immature Granulocytes: 1 %
LYMPHS ABS: 3.6 10*3/uL — AB (ref 0.7–3.1)
Lymphs: 49 %
MCH: 33.5 pg — AB (ref 26.6–33.0)
MCHC: 33.8 g/dL (ref 31.5–35.7)
MCV: 99 fL — ABNORMAL HIGH (ref 79–97)
MONOCYTES: 11 %
Monocytes Absolute: 0.8 10*3/uL (ref 0.1–0.9)
NEUTROS ABS: 2.5 10*3/uL (ref 1.4–7.0)
Neutrophils: 34 %
PLATELETS: 229 10*3/uL (ref 150–379)
RBC: 3.88 x10E6/uL — ABNORMAL LOW (ref 4.14–5.80)
RDW: 14.2 % (ref 12.3–15.4)
WBC: 7.3 10*3/uL (ref 3.4–10.8)

## 2014-12-14 LAB — BMP8+EGFR
BUN / CREAT RATIO: 13 (ref 9–20)
BUN: 23 mg/dL (ref 6–24)
CALCIUM: 9.6 mg/dL (ref 8.7–10.2)
CHLORIDE: 96 mmol/L — AB (ref 97–106)
CO2: 24 mmol/L (ref 18–29)
Creatinine, Ser: 1.73 mg/dL — ABNORMAL HIGH (ref 0.76–1.27)
GFR calc non Af Amer: 47 mL/min/{1.73_m2} — ABNORMAL LOW (ref >59)
GFR, EST AFRICAN AMERICAN: 54 mL/min/{1.73_m2} — AB (ref >59)
GLUCOSE: 335 mg/dL — AB (ref 65–99)
POTASSIUM: 3.8 mmol/L (ref 3.5–5.2)
Sodium: 142 mmol/L (ref 136–144)

## 2014-12-14 LAB — THYROID PANEL WITH TSH
FREE THYROXINE INDEX: 2.5 (ref 1.2–4.9)
T3 Uptake Ratio: 28 % (ref 24–39)
T4, Total: 9 ug/dL (ref 4.5–12.0)
TSH: 0.166 u[IU]/mL — AB (ref 0.450–4.500)

## 2014-12-14 LAB — VITAMIN D 25 HYDROXY (VIT D DEFICIENCY, FRACTURES): Vit D, 25-Hydroxy: 31.4 ng/mL (ref 30.0–100.0)

## 2014-12-14 LAB — LIPID PANEL
CHOL/HDL RATIO: 2.7 ratio (ref 0.0–5.0)
Cholesterol, Total: 133 mg/dL (ref 100–199)
HDL: 49 mg/dL (ref >39)
LDL Calculated: 50 mg/dL (ref 0–99)
Triglycerides: 169 mg/dL — ABNORMAL HIGH (ref 0–149)
VLDL CHOLESTEROL CAL: 34 mg/dL (ref 5–40)

## 2014-12-14 NOTE — Addendum Note (Signed)
Addended by: Thana Ates on: 12/14/2014 11:57 AM   Modules accepted: Orders

## 2014-12-28 LAB — HM DIABETES EYE EXAM

## 2015-01-01 ENCOUNTER — Other Ambulatory Visit: Payer: Self-pay | Admitting: Family Medicine

## 2015-01-03 ENCOUNTER — Ambulatory Visit: Payer: BLUE CROSS/BLUE SHIELD | Admitting: Internal Medicine

## 2015-01-04 ENCOUNTER — Other Ambulatory Visit: Payer: Self-pay | Admitting: Family Medicine

## 2015-01-05 ENCOUNTER — Other Ambulatory Visit: Payer: Self-pay | Admitting: Family Medicine

## 2015-01-07 ENCOUNTER — Other Ambulatory Visit: Payer: Self-pay | Admitting: Family Medicine

## 2015-01-17 ENCOUNTER — Other Ambulatory Visit: Payer: Self-pay | Admitting: Family Medicine

## 2015-02-01 ENCOUNTER — Other Ambulatory Visit: Payer: Self-pay | Admitting: Family Medicine

## 2015-02-01 ENCOUNTER — Other Ambulatory Visit: Payer: Self-pay | Admitting: Family

## 2015-02-03 ENCOUNTER — Encounter: Payer: Self-pay | Admitting: *Deleted

## 2015-02-09 ENCOUNTER — Encounter: Payer: Self-pay | Admitting: Internal Medicine

## 2015-02-09 ENCOUNTER — Ambulatory Visit (INDEPENDENT_AMBULATORY_CARE_PROVIDER_SITE_OTHER): Payer: BLUE CROSS/BLUE SHIELD | Admitting: Internal Medicine

## 2015-02-09 VITALS — BP 144/96 | HR 98 | Temp 97.8°F | Wt 205.0 lb

## 2015-02-09 DIAGNOSIS — B20 Human immunodeficiency virus [HIV] disease: Secondary | ICD-10-CM

## 2015-02-09 DIAGNOSIS — N183 Chronic kidney disease, stage 3 unspecified: Secondary | ICD-10-CM

## 2015-02-09 MED ORDER — ABACAVIR SULFATE-LAMIVUDINE 600-300 MG PO TABS: ORAL_TABLET | ORAL | Status: DC

## 2015-02-09 MED ORDER — DOLUTEGRAVIR SODIUM 50 MG PO TABS: ORAL_TABLET | ORAL | Status: DC

## 2015-02-09 NOTE — Assessment & Plan Note (Signed)
Stable, no indication for dose adjustment.

## 2015-02-09 NOTE — Progress Notes (Signed)
  Subjective:    Patient ID: Patrick Brown, male    DOB: 11/26/1970, 45 y.o.   MRN: [id number]  HPI Is in for followup of HIV.   He continues on Tivicay and Epzicom. He feels good and no fatigue since starting CPAP.   He denies any missed doses of his medications.  Last CD4 and viral load reviewed and is 820 and vl remains undetectable. Creat followed by Dr. Laurance Flatten and last was 1.73.   Review of Systems  Constitutional: Negative for fever.  HENT: Negative for sore throat.   Eyes: Negative for visual disturbance.  Respiratory: Negative for shortness of breath.   Gastrointestinal: Negative for nausea, abdominal pain and diarrhea.  Skin: Negative for rash.  Neurological: Negative for dizziness.       Objective:   Physical Exam  Constitutional: He appears well-developed and well-nourished. No distress.  HENT:  Mouth/Throat: No oropharyngeal exudate.  Eyes: No scleral icterus.  Cardiovascular: Normal rate, regular rhythm and normal heart sounds.   No murmur heard. Pulmonary/Chest: Effort normal and breath sounds normal. No respiratory distress.  Lymphadenopathy:    He has no cervical adenopathy.  Skin: No rash noted.          Assessment & Plan:

## 2015-02-09 NOTE — Assessment & Plan Note (Signed)
Doing well. RTC 6 months unless concerns.

## 2015-02-10 LAB — T-HELPER CELL (CD4) - (RCID CLINIC ONLY)
CD4 % Helper T Cell: 10 % — ABNORMAL LOW (ref 33–55)
CD4 T CELL ABS: 360 /uL — AB (ref 400–2700)

## 2015-02-13 LAB — HIV-1 RNA QUANT-NO REFLEX-BLD

## 2015-02-28 ENCOUNTER — Other Ambulatory Visit: Payer: Self-pay | Admitting: Family Medicine

## 2015-03-08 ENCOUNTER — Telehealth: Payer: Self-pay | Admitting: Pulmonary Disease

## 2015-03-08 NOTE — Telephone Encounter (Signed)
Auto CPAP 1//02/17 to 03/07/15 >> used on 30 of 30 nights with average 5 hrs and 56 min.  Average AHI is 3.9 with median CPAP 7 cm H2O and 95 th percentile CPAP 9 cm H20.   Please inform him that his CPAP report shows good control of his sleep apnea with CPAP.  It looks like he is only get about 6 hours of sleep with CPAP >> when he was seen in November 2016 he was getting about 7 hours sleep CPAP.  His daytime sleepiness might be related to insufficient sleep time while using CPAP.  He should try getting 7 to 8 hrs sleep per night, and use CPAP for entire time he is asleep.  If he is still having trouble with daytime sleepiness, then please schedule him for an ROV to further assess.

## 2015-03-08 NOTE — Telephone Encounter (Signed)
Patient notified of Dr. Juanetta Gosling recommendations.  Patient says that he will try to sleep 8 hours or more using CPAP and will contact our office to schedule follow up with Dr. Halford Chessman if he has any trouble. Nothing further needed.

## 2015-03-08 NOTE — Telephone Encounter (Signed)
Called and spoke to pt. Pt c/o persistent daytime and nighttime sleepiness. Pt states since using the CPAP he has noticed a very slight improvement in his tiredness but is still sleeping 22 hours in a 24 hour period. Pt states he is wearing his CPAP every time he goes to sleep. Pt is requesting recs from [facility], 30 day download placed in lookat.   Dr. Halford Chessman please advise. Thanks.

## 2015-03-10 ENCOUNTER — Other Ambulatory Visit: Payer: Self-pay | Admitting: Family Medicine

## 2015-03-11 ENCOUNTER — Other Ambulatory Visit: Payer: Self-pay | Admitting: Family Medicine

## 2015-03-13 NOTE — Telephone Encounter (Signed)
Last seen 11/28/14  DWM

## 2015-04-03 ENCOUNTER — Encounter: Payer: Self-pay | Admitting: Family Medicine

## 2015-04-03 ENCOUNTER — Ambulatory Visit (INDEPENDENT_AMBULATORY_CARE_PROVIDER_SITE_OTHER): Payer: BLUE CROSS/BLUE SHIELD | Admitting: Family Medicine

## 2015-04-03 VITALS — BP 128/84 | HR 98 | Temp 97.7°F | Ht 70.0 in | Wt 204.0 lb

## 2015-04-03 DIAGNOSIS — B20 Human immunodeficiency virus [HIV] disease: Secondary | ICD-10-CM

## 2015-04-03 DIAGNOSIS — E1065 Type 1 diabetes mellitus with hyperglycemia: Secondary | ICD-10-CM

## 2015-04-03 DIAGNOSIS — N183 Chronic kidney disease, stage 3 unspecified: Secondary | ICD-10-CM

## 2015-04-03 DIAGNOSIS — E039 Hypothyroidism, unspecified: Secondary | ICD-10-CM

## 2015-04-03 DIAGNOSIS — E1022 Type 1 diabetes mellitus with diabetic chronic kidney disease: Secondary | ICD-10-CM | POA: Diagnosis not present

## 2015-04-03 DIAGNOSIS — Z0000 Encounter for general adult medical examination without abnormal findings: Secondary | ICD-10-CM

## 2015-04-03 DIAGNOSIS — G4733 Obstructive sleep apnea (adult) (pediatric): Secondary | ICD-10-CM

## 2015-04-03 DIAGNOSIS — E871 Hypo-osmolality and hyponatremia: Secondary | ICD-10-CM

## 2015-04-03 DIAGNOSIS — E559 Vitamin D deficiency, unspecified: Secondary | ICD-10-CM

## 2015-04-03 DIAGNOSIS — E785 Hyperlipidemia, unspecified: Secondary | ICD-10-CM

## 2015-04-03 DIAGNOSIS — E875 Hyperkalemia: Secondary | ICD-10-CM

## 2015-04-03 DIAGNOSIS — F32A Depression, unspecified: Secondary | ICD-10-CM

## 2015-04-03 DIAGNOSIS — F329 Major depressive disorder, single episode, unspecified: Secondary | ICD-10-CM

## 2015-04-03 DIAGNOSIS — IMO0002 Reserved for concepts with insufficient information to code with codable children: Secondary | ICD-10-CM

## 2015-04-03 DIAGNOSIS — R569 Unspecified convulsions: Secondary | ICD-10-CM

## 2015-04-03 LAB — POCT URINALYSIS DIPSTICK
Bilirubin, UA: NEGATIVE
Glucose, UA: NEGATIVE
KETONES UA: NEGATIVE
LEUKOCYTES UA: NEGATIVE
Nitrite, UA: NEGATIVE
PH UA: 5
RBC UA: NEGATIVE
SPEC GRAV UA: 1.015
Urobilinogen, UA: NEGATIVE

## 2015-04-03 LAB — POCT UA - MICROSCOPIC ONLY
Bacteria, U Microscopic: NEGATIVE
CASTS, UR, LPF, POC: NEGATIVE
CRYSTALS, UR, HPF, POC: NEGATIVE
RBC, urine, microscopic: NEGATIVE
WBC, UR, HPF, POC: NEGATIVE
YEAST UA: NEGATIVE

## 2015-04-03 MED ORDER — LEVOTHYROXINE SODIUM 175 MCG PO TABS: 175.0000 ug | ORAL_TABLET | Freq: Every day | ORAL | Status: DC

## 2015-04-03 NOTE — Progress Notes (Signed)
Subjective:    Patient ID: Patrick Brown, male    DOB: [birth year], 45 y.o.   MRN: [id number]  HPI Patient is here today for annual wellness exam and follow up of chronic medical problems which includes hypothyroid, hyperlipidemia and diabetes. He is taking medications regularly. This patient is doing well overall. He is being followed by infectious disease an nephrology. He does complain today of being more depressed and says this seems to occur this time of the year. He is currently taking Effexor 150 mg daily.  He takes this as 37.5 two twice daily. He is not having as many upper respiratory infections this winter as he has had in the past. He is requesting a refill on his thyroid medicine. He will get a PSA routine lab work that he will come back for fasting Lelon Frohlich will return an FOBT. We'll also get a urinalysis. The patient continues with the specialist visits as mentioned above in addition to his ophthalmologist visit. He denies chest pain shortness of breath trouble swallowing heartburn indigestion nausea vomiting diarrhea or blood in the stool. He's passing his water without problems. His vision seems to be stable and he sees the ophthalmologist every 6 months. He is currently not taking his Flonase regularly but he was encouraged to do this because of the upcoming pollen season. He is also doing occasional nasal saline.      Patient Active Problem List   Diagnosis Date Noted  . OSA (obstructive sleep apnea) 10/06/2014  . Screening examination for venereal disease 12/23/2013  . Pain in joint, lower leg 11/19/2012  . Unspecified sinusitis (chronic) 09/16/2012  . Itch 08/17/2012  . Hypercalcemia 08/09/2012  . Right groin wound 08/09/2012  . Hyperkalemia 08/07/2012  . Hyponatremia 08/05/2012  . SOB (shortness of breath) 07/14/2012  . HIV disease ([place]) 06/02/2012  . Convulsions/seizures ([place]) 05/16/2012  . Pulmonary infiltrate 04/18/2012  . Gastroparesis 04/18/2012  . Anemia, iron  deficiency   . DM (diabetes mellitus), type 1, uncontrolled ([place]) 05/08/2011  . Hypothyroid 05/08/2011  . Chronic kidney disease, stage 3, mod decreased GFR 12/31/2010   Outpatient Encounter Prescriptions as of 04/03/2015  Medication Sig  . abacavir-lamiVUDine (EPZICOM) 600-300 MG tablet TAKE ONE TABLET (600MG-300MG) BY MOUTH ONCE DAILY AT BEDTIME. STORE ATCONTROLLED ROOM TEMPERATURE.  Marland Kitchen acetaminophen (TYLENOL) 500 MG tablet Take 1,000 mg by mouth 2 (two) times daily.   Marland Kitchen acyclovir (ZOVIRAX) 400 MG tablet TAKE 1 TABLET (400 MG TOTAL) BY MOUTH 2 (TWO) TIMES DAILY.  Marland Kitchen aspirin 81 MG chewable tablet Chew 81 mg by mouth every morning.  . colchicine 0.6 MG tablet Take 0.6 mg by mouth as needed.  . dolutegravir (TIVICAY) 50 MG tablet TAKE ONE TABLET (50MG) BY MOUTH ONCE DAILY AT BEDTIME. STORE AT CONTROLLED ROOM TEMPERATURE.  Marland Kitchen esomeprazole (NEXIUM) 40 MG capsule TAKE ONE CAPSULE BY MOUTH DAILY  . ferrous sulfate 325 (65 FE) MG tablet Take 650 mg by mouth daily with breakfast.   . fludrocortisone (FLORINEF) 0.1 MG tablet Take 0.2 mg by mouth daily.  . fluticasone (FLONASE) 50 MCG/ACT nasal spray Place 2 sprays into both nostrils daily.  . furosemide (LASIX) 40 MG tablet Take 20 mg by mouth 2 (two) times daily.  . Glucosamine-Chondroit-Vit C-Mn (GLUCOSAMINE 1500 COMPLEX PO) Take 1 tablet by mouth 2 (two) times daily.   . hydrOXYzine (ATARAX/VISTARIL) 25 MG tablet TAKE 1 TABLET (25 MG TOTAL) BY MOUTH EVERY 6 (SIX) HOURS AS NEEDED FOR ITCHING.  Marland Kitchen levothyroxine (SYNTHROID, LEVOTHROID) 175 MCG tablet  Take 175 mcg by mouth daily before breakfast.  . loratadine (CLARITIN) 10 MG tablet Take 10 mg by mouth daily as needed for allergies.  Marland Kitchen LORazepam (ATIVAN) 0.5 MG tablet TAKE 1 TABLET BY MOUTH EVERY DAY AS NEEDED  . metoCLOPramide (REGLAN) 5 MG tablet TAKE 1 TABLET BY MOUTH 3 TIMES A DAY BEFORE MEALS  . niacin (NIASPAN) 500 MG CR tablet TAKE 3 TABLETS BY MOUTH EVERY DAY  . NOVOLOG 100 UNIT/ML injection USE  PER PUMP AS DIRECTED  . omega-3 acid ethyl esters (LOVAZA) 1 G capsule TAKE 2 CAPSULES (2 G TOTAL) BY MOUTH 2 (TWO) TIMES DAILY.  Marland Kitchen ondansetron (ZOFRAN) 4 MG tablet Take 1 tablet (4 mg total) by mouth every 8 (eight) hours as needed for nausea.  Marland Kitchen OVER THE COUNTER MEDICATION Take 1 capsule by mouth daily. [name]- probiotic daily  . simvastatin (ZOCOR) 40 MG tablet TAKE 1 TABLET (40 MG TOTAL) BY MOUTH AT BEDTIME.  . sodium bicarbonate 650 MG tablet Take 650 mg by mouth every morning.   . Testosterone (ANDROGEL PUMP) 20.25 MG/ACT (1.62%) GEL APPLY 2 PUMPS ONCE DAILY AS DIRECTED  . traMADol (ULTRAM) 50 MG tablet TAKE 1 TABLET BY MOUTH EVERY 6 HOURS AS NEEDED FOR PAIN  . ULORIC 40 MG tablet TAKE 1 TABLET BY MOUTH EVERY DAY  . venlafaxine XR (EFFEXOR-XR) 37.5 MG 24 hr capsule TAKE 2 CAPSULES BY MOUTH TWICE A DAY  . zolpidem (AMBIEN) 5 MG tablet Take 1 tablet (5 mg total) by mouth at bedtime as needed for sleep.  . [DISCONTINUED] fluconazole (DIFLUCAN) 200 MG tablet Take 200 mg by mouth daily as needed.    No facility-administered encounter medications on file as of 04/03/2015.      Review of Systems  Constitutional: Negative.   HENT: Negative.   Eyes: Negative.   Respiratory: Negative.   Cardiovascular: Negative.   Gastrointestinal: Negative.   Endocrine: Negative.   Genitourinary: Negative.   Musculoskeletal: Negative.   Skin: Negative.   Allergic/Immunologic: Negative.   Neurological: Negative.   Hematological: Negative.   Psychiatric/Behavioral: Negative.        Depression       Objective:   Physical Exam  Constitutional: He is oriented to person, place, and time. He appears well-developed and well-nourished. No distress.  HENT:  Head: Normocephalic and atraumatic.  Right Ear: External ear normal.  Left Ear: External ear normal.  Nose: Nose normal.  Mouth/Throat: Oropharynx is clear and moist. No oropharyngeal exudate.  Eyes: Conjunctivae and EOM are normal. Pupils are  equal, round, and reactive to light. Right eye exhibits no discharge. Left eye exhibits no discharge. No scleral icterus.  Neck: Normal range of motion. Neck supple. No thyromegaly present.  No anterior cervical adenopathy noted thyromegaly and no bruits  Cardiovascular: Normal rate, regular rhythm, normal heart sounds and intact distal pulses.   No murmur heard. Heart has a regular rate and rhythm at 72/m  Pulmonary/Chest: Effort normal and breath sounds normal. No respiratory distress. He has no wheezes. He has no rales. He exhibits no tenderness.  Clear anteriorly and posteriorly  Abdominal: Soft. Bowel sounds are normal. He exhibits no mass. There is no tenderness. There is no rebound and no guarding.  Genitourinary: Rectum normal and penis normal.  The prostate is slightly enlarged but smooth. There is no lumps or masses. There are no rectal masses. The external genitalia were within normal limits with no hernia palpated and no inguinal adenopathy  Musculoskeletal: Normal range of motion. He  exhibits no edema or tenderness.  Lymphadenopathy:    He has no cervical adenopathy.  Neurological: He is alert and oriented to person, place, and time. He has normal reflexes. No cranial nerve deficit.  Skin: Skin is warm and dry. No rash noted.  Psychiatric: He has a normal mood and affect. His behavior is normal. Judgment and thought content normal.  Nursing note and vitals reviewed.  BP 128/84 mmHg  Pulse 98  Temp(Src) 97.7 F (36.5 C) (Oral)  Ht _0  (1.778 m)  Wt 204 lb (92.534 kg)  BMI 29.27 kg/m2        Assessment & Plan:  1. Annual physical exam -Return the FOBT - POCT glycosylated hemoglobin (Hb A1C); Future - BMP8+EGFR; Future - CBC with Differential/Platelet; Future - Hepatic function panel; Future - NMR, lipoprofile; Future - PSA, total and free; Future - Thyroid Panel With TSH; Future - VITAMIN D 25 Hydroxy (Vit-D Deficiency, Fractures); Future - POCT UA -  Microscopic Only - POCT urinalysis dipstick  2. Uncontrolled type 1 diabetes mellitus with stage 3 chronic kidney disease (HCC) -Continue good blood sugar control and monitor feet regularly - POCT glycosylated hemoglobin (Hb A1C); Future - BMP8+EGFR; Future - CBC with Differential/Platelet; Future  3. Chronic kidney disease, stage 3, mod decreased GFR -Continue good blood pressure control and good sugar control. - BMP8+EGFR; Future - CBC with Differential/Platelet; Future  4. Hypothyroidism, unspecified hypothyroidism type -Continue current thyroid replacement pending results of lab work - CBC with Differential/Platelet; Future - Thyroid Panel With TSH; Future  5. Hyperlipidemia -Continue statin drug pending results of lab work also continue aggressive therapeutic lifestyle changes - CBC with Differential/Platelet; Future - Hepatic function panel; Future - NMR, lipoprofile; Future  6. Vitamin D deficiency -Continue vitamin D replacement pending results of lab work - CBC with Differential/Platelet; Future - VITAMIN D 25 Hydroxy (Vit-D Deficiency, Fractures); Future  7. HIV disease ([place]) -Continue follow-up with infectious disease - CBC with Differential/Platelet; Future  8. OSA (obstructive sleep apnea) -Continue follow-up with pulmonology  9. Hyponatremia -Check BMP  10. Hyperkalemia -Check BMP  11. Convulsions, unspecified convulsion type ([place]) -No recent history of any convulsions  12. Depression -Check thyroid profile and testosterone level and continue with testosterone replacement as currently doing  Meds ordered this encounter  Medications  . levothyroxine (SYNTHROID, LEVOTHROID) 175 MCG tablet    Sig: Take 1 tablet (175 mcg total) by mouth daily before breakfast.    Dispense:  30 tablet    Refill:  11   Patient Instructions  Continue current medications. Continue good therapeutic lifestyle changes which include good diet and exercise. Fall precautions  discussed with patient. If an FOBT was given today- please return it to our front desk. If you are over 49 years old - you may need Prevnar 32 or the adult Pneumonia vaccine.  **Flu shots are available--- please call and schedule a FLU-CLINIC appointment**  After your visit with [place] today you will receive a survey in the mail or online from [employer] regarding your care with [place]. Please take a moment to fill this out. Your feedback is very important to [place] as you can help [place] better understand your patient needs as well as improve your experience and satisfaction. WE CARE ABOUT YOU!!!     continue to follow-up with nephrology , infectious disease and ophthalmology  we will call with lab work results as soon as they become available  We will address the depression issue once we make sure  that all of the lab work is good and stable Use nasal saline more regularly and start using Flonase regularly Drink plenty of fluids and stay well hydrated   Arrie Senate MD

## 2015-04-03 NOTE — Patient Instructions (Addendum)
Continue current medications. Continue good therapeutic lifestyle changes which include good diet and exercise. Fall precautions discussed with patient. If an FOBT was given today- please return it to our front desk. If you are over 47 years old - you may need Prevnar 24 or the adult Pneumonia vaccine.  **Flu shots are available--- please call and schedule a FLU-CLINIC appointment**  After your visit with [place] today you will receive a survey in the mail or online from [employer] regarding your care with [place]. Please take a moment to fill this out. Your feedback is very important to [place] as you can help [place] better understand your patient needs as well as improve your experience and satisfaction. WE CARE ABOUT YOU!!!     continue to follow-up with nephrology , infectious disease and ophthalmology  we will call with lab work results as soon as they become available  We will address the depression issue once we make sure that all of the lab work is good and stable Use nasal saline more regularly and start using Flonase regularly Drink plenty of fluids and stay well hydrated

## 2015-04-10 ENCOUNTER — Other Ambulatory Visit: Payer: BLUE CROSS/BLUE SHIELD

## 2015-04-10 DIAGNOSIS — E1065 Type 1 diabetes mellitus with hyperglycemia: Secondary | ICD-10-CM

## 2015-04-10 DIAGNOSIS — E559 Vitamin D deficiency, unspecified: Secondary | ICD-10-CM

## 2015-04-10 DIAGNOSIS — N183 Chronic kidney disease, stage 3 unspecified: Secondary | ICD-10-CM

## 2015-04-10 DIAGNOSIS — E039 Hypothyroidism, unspecified: Secondary | ICD-10-CM

## 2015-04-10 DIAGNOSIS — E1022 Type 1 diabetes mellitus with diabetic chronic kidney disease: Secondary | ICD-10-CM

## 2015-04-10 DIAGNOSIS — E785 Hyperlipidemia, unspecified: Secondary | ICD-10-CM

## 2015-04-10 DIAGNOSIS — Z0000 Encounter for general adult medical examination without abnormal findings: Secondary | ICD-10-CM

## 2015-04-10 DIAGNOSIS — B20 Human immunodeficiency virus [HIV] disease: Secondary | ICD-10-CM

## 2015-04-10 DIAGNOSIS — IMO0002 Reserved for concepts with insufficient information to code with codable children: Secondary | ICD-10-CM

## 2015-04-10 LAB — BAYER DCA HB A1C WAIVED: HB A1C: 6.6 % (ref <7.0)

## 2015-04-11 LAB — NMR, LIPOPROFILE
Cholesterol: 168 mg/dL (ref 100–199)
HDL CHOLESTEROL BY NMR: 65 mg/dL (ref >39)
HDL PARTICLE NUMBER: 33.1 umol/L (ref >=30.5)
LDL Particle Number: 1002 nmol/L — ABNORMAL HIGH (ref <1000)
LDL Size: 20.8 nm (ref >20.5)
LDL-C: 85 mg/dL (ref 0–99)
LP-IR Score: 37 (ref <=45)
SMALL LDL PARTICLE NUMBER: 365 nmol/L (ref <=527)
TRIGLYCERIDES BY NMR: 92 mg/dL (ref 0–149)

## 2015-04-11 LAB — BMP8+EGFR
BUN / CREAT RATIO: 12 (ref 9–20)
BUN: 15 mg/dL (ref 6–24)
CO2: 24 mmol/L (ref 18–29)
CREATININE: 1.3 mg/dL — AB (ref 0.76–1.27)
Calcium: 9.4 mg/dL (ref 8.7–10.2)
Chloride: 98 mmol/L (ref 96–106)
GFR, EST AFRICAN AMERICAN: 76 mL/min/{1.73_m2} (ref >59)
GFR, EST NON AFRICAN AMERICAN: 65 mL/min/{1.73_m2} (ref >59)
GLUCOSE: 181 mg/dL — AB (ref 65–99)
Potassium: 3.9 mmol/L (ref 3.5–5.2)
SODIUM: 141 mmol/L (ref 134–144)

## 2015-04-11 LAB — HEPATIC FUNCTION PANEL
ALBUMIN: 4.2 g/dL (ref 3.5–5.5)
ALT: 54 IU/L — ABNORMAL HIGH (ref 0–44)
AST: 43 IU/L — AB (ref 0–40)
Alkaline Phosphatase: 102 IU/L (ref 39–117)
BILIRUBIN TOTAL: 0.3 mg/dL (ref 0.0–1.2)
Bilirubin, Direct: 0.1 mg/dL (ref 0.00–0.40)
Total Protein: 7.3 g/dL (ref 6.0–8.5)

## 2015-04-11 LAB — CBC WITH DIFFERENTIAL/PLATELET
BASOS: 1 %
Basophils Absolute: 0 10*3/uL (ref 0.0–0.2)
EOS (ABSOLUTE): 0.4 10*3/uL (ref 0.0–0.4)
EOS: 7 %
HEMATOCRIT: 40 % (ref 37.5–51.0)
HEMOGLOBIN: 13.7 g/dL (ref 12.6–17.7)
Immature Grans (Abs): 0.1 10*3/uL (ref 0.0–0.1)
Immature Granulocytes: 1 %
LYMPHS ABS: 2.8 10*3/uL (ref 0.7–3.1)
Lymphs: 44 %
MCH: 32.7 pg (ref 26.6–33.0)
MCHC: 34.3 g/dL (ref 31.5–35.7)
MCV: 96 fL (ref 79–97)
MONOCYTES: 10 %
MONOS ABS: 0.6 10*3/uL (ref 0.1–0.9)
NEUTROS ABS: 2.3 10*3/uL (ref 1.4–7.0)
Neutrophils: 37 %
Platelets: 186 10*3/uL (ref 150–379)
RBC: 4.19 x10E6/uL (ref 4.14–5.80)
RDW: 15.3 % (ref 12.3–15.4)
WBC: 6.2 10*3/uL (ref 3.4–10.8)

## 2015-04-11 LAB — THYROID PANEL WITH TSH
FREE THYROXINE INDEX: 2.2 (ref 1.2–4.9)
T3 UPTAKE RATIO: 27 % (ref 24–39)
T4 TOTAL: 8.3 ug/dL (ref 4.5–12.0)
TSH: 0.715 u[IU]/mL (ref 0.450–4.500)

## 2015-04-11 LAB — VITAMIN D 25 HYDROXY (VIT D DEFICIENCY, FRACTURES): VIT D 25 HYDROXY: 25.7 ng/mL — AB (ref 30.0–100.0)

## 2015-04-11 LAB — PSA, TOTAL AND FREE
PROSTATE SPECIFIC AG, SERUM: 0.3 ng/mL (ref 0.0–4.0)
PSA, Free Pct: 30 %
PSA, Free: 0.09 ng/mL

## 2015-04-26 ENCOUNTER — Telehealth: Payer: Self-pay | Admitting: Family Medicine

## 2015-04-26 MED ORDER — DULOXETINE HCL 60 MG PO CPEP: 60.0000 mg | ORAL_CAPSULE | Freq: Two times a day (BID) | ORAL | Status: DC

## 2015-04-26 NOTE — Telephone Encounter (Signed)
Pt aware and new med ordered

## 2015-04-26 NOTE — Telephone Encounter (Signed)
Patient is taking 37.5 2 BID

## 2015-04-26 NOTE — Telephone Encounter (Signed)
Recommend trial of cymbalta 60mg  1 capsule daily at bedtime.  Can increase to 2 capusles if needed after 2 weeks.

## 2015-04-26 NOTE — Telephone Encounter (Signed)
Please confirm with patient his total current dose

## 2015-04-26 NOTE — Telephone Encounter (Signed)
Please start Cymbalta and discontinue Effexor if it can be discontinued completely and it doesn't have to be tapered down. Start Cymbalta 60 mg 1 at bedtime.

## 2015-04-26 NOTE — Telephone Encounter (Signed)
Please forward to clinical pharmacy to see what suggestions they may have about helping his depression since he is Re: Taking 37.5 XRT twice daily of Effexor

## 2015-05-05 ENCOUNTER — Other Ambulatory Visit: Payer: Self-pay | Admitting: Family Medicine

## 2015-05-16 ENCOUNTER — Other Ambulatory Visit: Payer: Self-pay | Admitting: Family Medicine

## 2015-05-21 ENCOUNTER — Other Ambulatory Visit: Payer: Self-pay | Admitting: Family Medicine

## 2015-05-23 ENCOUNTER — Telehealth: Payer: Self-pay | Admitting: Pulmonary Disease

## 2015-05-23 NOTE — Telephone Encounter (Signed)
Spoke with pt.  OV scheduled with Dr. Halford Chessman on 05/26/15 at 1:30 pm.  Pt confirmed appt and voiced no further questions or concerns at this time.

## 2015-05-23 NOTE — Telephone Encounter (Signed)
Patient states that he is still falling asleep during the day time.  Sleeping a lot even while using CPAP.  Patient would like to be tested for Narcolepsy.  Dr. Halford Chessman, please advise.

## 2015-05-23 NOTE — Telephone Encounter (Signed)
Please schedule ROV with me to discuss >> okay to double book visit.

## 2015-05-26 ENCOUNTER — Ambulatory Visit: Payer: BLUE CROSS/BLUE SHIELD | Admitting: Pulmonary Disease

## 2015-05-27 ENCOUNTER — Other Ambulatory Visit: Payer: Self-pay | Admitting: Internal Medicine

## 2015-05-31 ENCOUNTER — Other Ambulatory Visit: Payer: Self-pay | Admitting: Family Medicine

## 2015-06-01 NOTE — Telephone Encounter (Signed)
Last seen 04/03/15  DWM  If approved route to nurse to call into [facility]  [phone]

## 2015-06-02 ENCOUNTER — Ambulatory Visit: Payer: BLUE CROSS/BLUE SHIELD | Admitting: Adult Health

## 2015-06-02 NOTE — Telephone Encounter (Signed)
rx called into pharmacy

## 2015-06-05 ENCOUNTER — Other Ambulatory Visit: Payer: Self-pay | Admitting: Family Medicine

## 2015-06-06 ENCOUNTER — Encounter: Payer: Self-pay | Admitting: *Deleted

## 2015-06-08 ENCOUNTER — Encounter: Payer: Self-pay | Admitting: Adult Health

## 2015-06-08 ENCOUNTER — Ambulatory Visit (INDEPENDENT_AMBULATORY_CARE_PROVIDER_SITE_OTHER): Payer: BLUE CROSS/BLUE SHIELD | Admitting: Adult Health

## 2015-06-08 VITALS — BP 122/92 | HR 87 | Temp 97.8°F | Ht 70.0 in | Wt 207.0 lb

## 2015-06-08 DIAGNOSIS — G4733 Obstructive sleep apnea (adult) (pediatric): Secondary | ICD-10-CM

## 2015-06-08 NOTE — Patient Instructions (Signed)
Continue on CPAP At bedtime  And with naps.  Try to wear for at least 6hrs.  Try to avoid sedating medications if possible , .ie Atarax.  We are setting you up for MSLT to check for possible narcolepsy.  Follow up Dr. Halford Brown  In 3-4 months and As needed.

## 2015-06-12 ENCOUNTER — Encounter: Payer: Self-pay | Admitting: Adult Health

## 2015-06-12 NOTE — Progress Notes (Signed)
Subjective:    Patient ID: Patrick Brown, male    DOB: [birth year], 45 y.o.   MRN: [id number]  HPI  44 yo male with HIV , DM , seen for sleep consult in May 2016   TEST  PSG 09/26/14 >> AHI 11.3, SpO2 low 86%, PLMI 51.9  06/08/15 Follow up : OSA  Pt returns for 6 month  follow up for sleep apnea.  Uses CPAP on average 6-9 hours nightly. Mask fits good. Says he has been wearing CPAP each night for 6-9 hr but still feels sleepy throughout day. Can sleep anytime. Does not use CPAP in am .  Download shows 93% usage w/ avg 5hr . AHI 4.2.  He is concerned that he may have narcolepsy due to excessive sleepiness.  Pt is on cymbalta and effexor . Takes atarax and reglan  Denies chest pain, orthopnea,edema or fever.  Lab review shows previous iron panel ok in 2015. Recent TSH overactive with thyroid rx adjusted by PCP and hbg ok.     PSG 09/26/14 >> AHI 11.3, SpO2 low 86%, PLMI 51.   Review of Systems   Constitutional:   No  weight loss, night sweats,  Fevers, chills, + fatigue, or  lassitude.  HEENT:   No headaches,  Difficulty swallowing,  Tooth/dental problems, or  Sore throat,                No sneezing, itching, ear ache, nasal congestion, post nasal drip,   CV:  No chest pain,  Orthopnea, PND, swelling in lower extremities, anasarca, dizziness, palpitations, syncope.   GI  No heartburn, indigestion, abdominal pain, nausea, vomiting, diarrhea, change in bowel habits, loss of appetite, bloody stools.   Resp: No shortness of breath with exertion or at rest.  No excess mucus, no productive cough,  No non-productive cough,  No coughing up of blood.  No change in color of mucus.  No wheezing.  No chest wall deformity  Skin: no rash or lesions.  GU: no dysuria, change in color of urine, no urgency or frequency.  No flank pain, no hematuria   MS:  No joint pain or swelling.  No decreased range of motion.  No back pain.  Psych:  No change in mood or affect. No depression or anxiety.  No  memory loss.         Objective:   Physical Exam  Filed Vitals:   06/08/15 1415  BP: 122/92  Pulse: 87  Temp: 97.8 F (36.6 C)  TempSrc: Oral  Height: 5\' 10"  (1.778 m)  Weight: 207 lb (93.895 kg)  SpO2: 97%  Body mass index is 29.7 kg/(m^2).   GEN: A/Ox3; pleasant , NAD, well nourished , overweight   HEENT:  [place]/AT,  EACs-clear, TMs-wnl, NOSE-clear, THROAT-clear, no lesions, no postnasal drip or exudate noted. Class 2-3 airway   NECK:  Supple w/ fair ROM; no JVD; normal carotid impulses w/o bruits; no thyromegaly or nodules palpated; no lymphadenopathy.  RESP  Clear  P & A; w/o, wheezes/ rales/ or rhonchi.no accessory muscle use, no dullness to percussion  CARD:  RRR, no m/r/g  , no peripheral edema, pulses intact, no cyanosis or clubbing.  GI:   Soft & nt; nml bowel sounds; no organomegaly or masses detected.  Musco: Warm bil, no deformities or joint swelling noted.   Neuro: alert, no focal deficits noted.    Skin: Warm, no lesions or rashes  Patrick Bias NP-C  [facility]  06/08/15  Assessment & Plan:

## 2015-06-13 NOTE — Assessment & Plan Note (Signed)
Compliant with CPAP and adequate control but residual daytime sleepiness.  ? Medication side effects , RLS (PLMI 51 on sleep study previously)  Will set up for MSLT to check for narcolepsy .  May need to address RLS in more detail on return if neg.  Advised on sedating rx -limit if able. Cont w/ CPAP compliance.   Plan  Continue on CPAP At bedtime  And with naps.  Try to wear for at least 6hrs.  Try to avoid sedating medications if possible , .ie Atarax.  We are setting you up for MSLT to check for possible narcolepsy.  Follow up Dr. Halford Chessman  In 3-4 months and As needed.

## 2015-06-20 ENCOUNTER — Ambulatory Visit (HOSPITAL_BASED_OUTPATIENT_CLINIC_OR_DEPARTMENT_OTHER): Payer: BLUE CROSS/BLUE SHIELD

## 2015-06-21 ENCOUNTER — Encounter: Payer: Self-pay | Admitting: Adult Health

## 2015-06-29 ENCOUNTER — Other Ambulatory Visit: Payer: Self-pay | Admitting: Family Medicine

## 2015-07-12 ENCOUNTER — Ambulatory Visit (INDEPENDENT_AMBULATORY_CARE_PROVIDER_SITE_OTHER): Payer: Medicare Other | Admitting: Physician Assistant

## 2015-07-12 ENCOUNTER — Encounter: Payer: Self-pay | Admitting: Physician Assistant

## 2015-07-12 ENCOUNTER — Other Ambulatory Visit: Payer: Self-pay | Admitting: Family Medicine

## 2015-07-12 VITALS — BP 136/94 | HR 98 | Temp 97.9°F | Ht 70.0 in | Wt 205.3 lb

## 2015-07-12 DIAGNOSIS — J069 Acute upper respiratory infection, unspecified: Secondary | ICD-10-CM

## 2015-07-12 MED ORDER — AMOXICILLIN-POT CLAVULANATE 875-125 MG PO TABS: 1.0000 | ORAL_TABLET | Freq: Two times a day (BID) | ORAL | Status: DC

## 2015-07-12 NOTE — Patient Instructions (Signed)
Upper Respiratory Infection, Adult Most upper respiratory infections (URIs) are a viral infection of the air passages leading to the lungs. A URI affects the nose, throat, and upper air passages. The most common type of URI is nasopharyngitis and is typically referred to as "the common cold." URIs run their course and usually go away on their own. Most of the time, a URI does not require medical attention, but sometimes a bacterial infection in the upper airways can follow a viral infection. This is called a secondary infection. Sinus and middle ear infections are common types of secondary upper respiratory infections. Bacterial pneumonia can also complicate a URI. A URI can worsen asthma and chronic obstructive pulmonary disease (COPD). Sometimes, these complications can require emergency medical care and may be life threatening.  CAUSES Almost all URIs are caused by viruses. A virus is a type of germ and can spread from one person to another.  RISKS FACTORS You may be at risk for a URI if:   You smoke.   You have chronic heart or lung disease.  You have a weakened defense (immune) system.   You are very young or very old.   You have nasal allergies or asthma.  You work in crowded or poorly ventilated areas.  You work in health care facilities or schools. SIGNS AND SYMPTOMS  Symptoms typically develop 2-3 days after you come in contact with a cold virus. Most viral URIs last 7-10 days. However, viral URIs from the influenza virus (flu virus) can last 14-18 days and are typically more severe. Symptoms may include:   Runny or stuffy (congested) nose.   Sneezing.   Cough.   Sore throat.   Headache.   Fatigue.   Fever.   Loss of appetite.   Pain in your forehead, behind your eyes, and over your cheekbones (sinus pain).  Muscle aches.  DIAGNOSIS  Your health care provider may diagnose a URI by:  Physical exam.  Tests to check that your symptoms are not due to  another condition such as:  Strep throat.  Sinusitis.  Pneumonia.  Asthma. TREATMENT  A URI goes away on its own with time. It cannot be cured with medicines, but medicines may be prescribed or recommended to relieve symptoms. Medicines may help:  Reduce your fever.  Reduce your cough.  Relieve nasal congestion. HOME CARE INSTRUCTIONS   Take medicines only as directed by your health care provider.   Gargle warm saltwater or take cough drops to comfort your throat as directed by your health care provider.  Use a warm mist humidifier or inhale steam from a shower to increase air moisture. This may make it easier to breathe.  Drink enough fluid to keep your urine clear or pale yellow.   Eat soups and other clear broths and maintain good nutrition.   Rest as needed.   Return to work when your temperature has returned to normal or as your health care provider advises. You may need to stay home longer to avoid infecting others. You can also use a face mask and careful hand washing to prevent spread of the virus.  Increase the usage of your inhaler if you have asthma.   Do not use any tobacco products, including cigarettes, chewing tobacco, or electronic cigarettes. If you need help quitting, ask your health care provider. PREVENTION  The best way to protect yourself from getting a cold is to practice good hygiene.   Avoid oral or hand contact with people with cold   symptoms.   Wash your hands often if contact occurs.  There is no clear evidence that vitamin C, vitamin E, echinacea, or exercise reduces the chance of developing a cold. However, it is always recommended to get plenty of rest, exercise, and practice good nutrition.  SEEK MEDICAL CARE IF:   You are getting worse rather than better.   Your symptoms are not controlled by medicine.   You have chills.  You have worsening shortness of breath.  You have brown or red mucus.  You have yellow or brown nasal  discharge.  You have pain in your face, especially when you bend forward.  You have a fever.  You have swollen neck glands.  You have pain while swallowing.  You have white areas in the back of your throat. SEEK IMMEDIATE MEDICAL CARE IF:   You have severe or persistent:  Headache.  Ear pain.  Sinus pain.  Chest pain.  You have chronic lung disease and any of the following:  Wheezing.  Prolonged cough.  Coughing up blood.  A change in your usual mucus.  You have a stiff neck.  You have changes in your:  Vision.  Hearing.  Thinking.  Mood. MAKE SURE YOU:   Understand these instructions.  Will watch your condition.  Will get help right away if you are not doing well or get worse.   This information is not intended to replace advice given to you by your health care provider. Make sure you discuss any questions you have with your health care provider.   Document Released: 07/17/2000 Document Revised: 06/07/2014 Document Reviewed: 04/28/2013 Elsevier Interactive Patient Education 2016 [employer] Inc[employer]  

## 2015-07-12 NOTE — Progress Notes (Signed)
Subjective:     Patient ID: Patrick Brown, male   DOB: [birth year], 45 y.o.   MRN: [id number]  HPI Pt with prod cough and congestion since the beginning of the week Pt recently on Levaquin for sinusitis He has also noted increase in BS up to 311 with his norm being in the 130 range  Review of Systems  Constitutional: Positive for activity change and fatigue. Negative for appetite change.  HENT: Positive for congestion and sinus pressure. Negative for ear discharge and nosebleeds.   Respiratory: Negative for chest tightness.   Cardiovascular: Negative.        Objective:   Physical Exam  Constitutional: He appears well-developed and well-nourished.  HENT:  Right Ear: External ear normal.  Left Ear: External ear normal.  Mouth/Throat: Oropharynx is clear and moist. No oropharyngeal exudate.  Neck: Neck supple.  Cardiovascular: Normal rate, regular rhythm and normal heart sounds.   Pulmonary/Chest: Effort normal and breath sounds normal. No respiratory distress. He has no wheezes.  Coarse lungs sounds bilat that clear with cough  Lymphadenopathy:    He has no cervical adenopathy.  Nursing note and vitals reviewed.      Assessment:     1. Acute upper respiratory infection        Plan:     Rest Fluids OTC meds for cough Augmentin 875mg  bid with food x 2 weeks Keep regular f/u

## 2015-08-02 ENCOUNTER — Ambulatory Visit: Payer: BLUE CROSS/BLUE SHIELD | Admitting: Family Medicine

## 2015-08-02 ENCOUNTER — Other Ambulatory Visit: Payer: Self-pay | Admitting: Family Medicine

## 2015-08-03 ENCOUNTER — Ambulatory Visit: Payer: BLUE CROSS/BLUE SHIELD | Admitting: Internal Medicine

## 2015-08-04 ENCOUNTER — Encounter: Payer: Self-pay | Admitting: Family Medicine

## 2015-08-04 ENCOUNTER — Ambulatory Visit (INDEPENDENT_AMBULATORY_CARE_PROVIDER_SITE_OTHER): Payer: Medicare Other | Admitting: Family Medicine

## 2015-08-04 VITALS — BP 133/80 | HR 94 | Temp 97.3°F | Ht 70.0 in | Wt 200.0 lb

## 2015-08-04 DIAGNOSIS — E559 Vitamin D deficiency, unspecified: Secondary | ICD-10-CM | POA: Diagnosis not present

## 2015-08-04 DIAGNOSIS — E785 Hyperlipidemia, unspecified: Secondary | ICD-10-CM | POA: Diagnosis not present

## 2015-08-04 DIAGNOSIS — E1022 Type 1 diabetes mellitus with diabetic chronic kidney disease: Secondary | ICD-10-CM | POA: Diagnosis not present

## 2015-08-04 DIAGNOSIS — IMO0002 Reserved for concepts with insufficient information to code with codable children: Secondary | ICD-10-CM

## 2015-08-04 DIAGNOSIS — N183 Chronic kidney disease, stage 3 unspecified: Secondary | ICD-10-CM

## 2015-08-04 DIAGNOSIS — M2550 Pain in unspecified joint: Secondary | ICD-10-CM

## 2015-08-04 DIAGNOSIS — D509 Iron deficiency anemia, unspecified: Secondary | ICD-10-CM

## 2015-08-04 DIAGNOSIS — B20 Human immunodeficiency virus [HIV] disease: Secondary | ICD-10-CM

## 2015-08-04 DIAGNOSIS — E039 Hypothyroidism, unspecified: Secondary | ICD-10-CM | POA: Diagnosis not present

## 2015-08-04 DIAGNOSIS — E1065 Type 1 diabetes mellitus with hyperglycemia: Secondary | ICD-10-CM | POA: Diagnosis not present

## 2015-08-04 NOTE — Addendum Note (Signed)
Addended by: Zannie Cove on: 08/04/2015 04:58 PM   Modules accepted: Orders, SmartSet

## 2015-08-04 NOTE — Patient Instructions (Addendum)
Continue current medications. Continue good therapeutic lifestyle changes which include good diet and exercise. Fall precautions discussed with patient. If an FOBT was given today- please return it to our front desk. If you are over 47 years old - you may need Prevnar 61 or the adult Pneumonia vaccine.  **Flu shots are available--- please call and schedule a FLU-CLINIC appointment**  After your visit with [place] today you will receive a survey in the mail or online from [employer] regarding your care with [place]. Please take a moment to fill this out. Your feedback is very important to [place] as you can help [place] better understand your patient needs as well as improve your experience and satisfaction. WE CARE ABOUT YOU!!!   Continue to follow-up with pulmonology, infectious disease and ophthalmology and nephrology.

## 2015-08-04 NOTE — Progress Notes (Signed)
Subjective:    Patient ID: Patrick Brown, male    DOB: 02-Oct-1970, 45 y.o.   MRN: [id number]  HPI Pt here for follow up and management of chronic medical problems which includes diabetes, hypothyroid, and CKD. He is taking medications regularly.      Patient Active Problem List   Diagnosis Date Noted  . OSA (obstructive sleep apnea) 10/06/2014  . Screening examination for venereal disease 12/23/2013  . Pain in joint, lower leg 11/19/2012  . Unspecified sinusitis (chronic) 09/16/2012  . Itch 08/17/2012  . Hypercalcemia 08/09/2012  . Right groin wound 08/09/2012  . Hyperkalemia 08/07/2012  . Hyponatremia 08/05/2012  . SOB (shortness of breath) 07/14/2012  . HIV disease ([place]) 06/02/2012  . Convulsions/seizures ([place]) 05/16/2012  . Pulmonary infiltrate 04/18/2012  . Gastroparesis 04/18/2012  . Anemia, iron deficiency   . DM (diabetes mellitus), type 1, uncontrolled ([place]) 05/08/2011  . Hypothyroid 05/08/2011  . Chronic kidney disease, stage 3, mod decreased GFR 12/31/2010   Outpatient Encounter Prescriptions as of 08/04/2015  Medication Sig  . acetaminophen (TYLENOL) 500 MG tablet Take 1,000 mg by mouth 2 (two) times daily.   Marland Kitchen acyclovir (ZOVIRAX) 400 MG tablet TAKE 1 TABLET (400 MG TOTAL) BY MOUTH 2 (TWO) TIMES DAILY.  Marland Kitchen amoxicillin-clavulanate (AUGMENTIN) 875-125 MG tablet Take 1 tablet by mouth 2 (two) times daily.  . ANDROGEL PUMP 20.25 MG/ACT (1.62%) GEL APPLY 2 PUMPS ONCE DAILY AS DIRECTED  . aspirin 81 MG chewable tablet Chew 81 mg by mouth every morning.  . colchicine 0.6 MG tablet Take 0.6 mg by mouth as needed.  . DULoxetine (CYMBALTA) 60 MG capsule Take 1 capsule (60 mg total) by mouth 2 (two) times daily. As directed  . EPZICOM 600-300 MG tablet TAKE ONE TABLET ('600MG'$ -'300MG'$ ) BY MOUTH ONCE DAILY AT BEDTIME. STORE  AT CONTROLLED ROOM TEMPERATURE.  Marland Kitchen esomeprazole (NEXIUM) 40 MG capsule TAKE ONE CAPSULE BY MOUTH DAILY  . ferrous sulfate 325 (65 FE) MG tablet Take 650 mg  by mouth daily with breakfast.   . fluconazole (DIFLUCAN) 200 MG tablet TAKE 1 TABLET (200 MG TOTAL) BY MOUTH DAILY.  . fludrocortisone (FLORINEF) 0.1 MG tablet Take 0.2 mg by mouth daily.  . fluticasone (FLONASE) 50 MCG/ACT nasal spray Place 2 sprays into both nostrils daily.  . furosemide (LASIX) 40 MG tablet Take 20 mg by mouth 2 (two) times daily.  . Glucosamine-Chondroit-Vit C-Mn (GLUCOSAMINE 1500 COMPLEX PO) Take 1 tablet by mouth 2 (two) times daily.   Marland Kitchen levothyroxine (SYNTHROID, LEVOTHROID) 175 MCG tablet Take 1 tablet (175 mcg total) by mouth daily before breakfast.  . loratadine (CLARITIN) 10 MG tablet Take 10 mg by mouth daily as needed for allergies.  Marland Kitchen LORazepam (ATIVAN) 0.5 MG tablet TAKE 1 TABLET BY MOUTH EVERY DAY AS NEEDED  . metoCLOPramide (REGLAN) 5 MG tablet TAKE 1 TABLET BY MOUTH 3 TIMES A DAY BEFORE MEALS  . niacin (NIASPAN) 1000 MG CR tablet TAKE 1 TABLET (1,000 MG TOTAL) BY MOUTH AT BEDTIME.  Marland Kitchen NOVOLOG 100 UNIT/ML injection USE PER PUMP AS DIRECTED  . omega-3 acid ethyl esters (LOVAZA) 1 g capsule TAKE 2 CAPSULES (2 G TOTAL) BY MOUTH 2 (TWO) TIMES DAILY.  Marland Kitchen ondansetron (ZOFRAN) 4 MG tablet Take 1 tablet (4 mg total) by mouth every 8 (eight) hours as needed for nausea.  Marland Kitchen OVER THE COUNTER MEDICATION Take 1 capsule by mouth daily. [name]- probiotic daily  . simvastatin (ZOCOR) 40 MG tablet TAKE 1 TABLET (40 MG TOTAL) BY MOUTH  AT BEDTIME.  Marland Kitchen TIVICAY 50 MG tablet TAKE ONE TABLET ('50MG'$ ) BY MOUTH ONCE DAILY AT BEDTIME. STORE AT CONTROLLED ROOM TEMPERATURE.  . traMADol (ULTRAM) 50 MG tablet TAKE 1 TABLET BY MOUTH EVERY 6 HOURS AS NEEDED FOR PAIN  . ULORIC 40 MG tablet TAKE 1 TABLET BY MOUTH EVERY DAY  . zolpidem (AMBIEN) 5 MG tablet Take 1 tablet (5 mg total) by mouth at bedtime as needed for sleep.  . [DISCONTINUED] hydrOXYzine (ATARAX/VISTARIL) 25 MG tablet TAKE 1 TABLET (25 MG TOTAL) BY MOUTH EVERY 6 (SIX) HOURS AS NEEDED FOR ITCHING.  . [DISCONTINUED] sodium bicarbonate  650 MG tablet Take 650 mg by mouth every morning.   . [DISCONTINUED] venlafaxine XR (EFFEXOR-XR) 37.5 MG 24 hr capsule TAKE 2 CAPSULES BY MOUTH TWICE A DAY   No facility-administered encounter medications on file as of 08/04/2015.     Review of Systems  Constitutional: Negative.   HENT: Negative.   Eyes: Negative.   Respiratory: Negative.   Cardiovascular: Negative.   Gastrointestinal: Negative.   Endocrine: Negative.   Genitourinary: Negative.   Musculoskeletal: Negative.   Skin: Negative.   Allergic/Immunologic: Negative.   Neurological: Negative.   Hematological: Negative.   Psychiatric/Behavioral: Negative.        Objective:   Physical Exam  Constitutional: He is oriented to person, place, and time. He appears well-developed and well-nourished. No distress.  Alert pleasant and in good spirits  HENT:  Head: Normocephalic and atraumatic.  Right Ear: External ear normal.  Left Ear: External ear normal.  Nose: Nose normal.  Mouth/Throat: Oropharynx is clear and moist. No oropharyngeal exudate.  Eyes: Conjunctivae and EOM are normal. Pupils are equal, round, and reactive to light. Right eye exhibits no discharge. Left eye exhibits no discharge. No scleral icterus.  Neck: Normal range of motion. Neck supple. No thyromegaly present.  No bruits or thyromegaly  Cardiovascular: Normal rate, regular rhythm, normal heart sounds and intact distal pulses.   No murmur heard. 72/m  Pulmonary/Chest: Effort normal and breath sounds normal. No respiratory distress. He has no wheezes. He has no rales. He exhibits no tenderness.  Abdominal: Soft. Bowel sounds are normal. He exhibits no mass. There is no tenderness. There is no rebound and no guarding.  Musculoskeletal: Normal range of motion. He exhibits no edema.  Lymphadenopathy:    He has no cervical adenopathy.  Neurological: He is alert and oriented to person, place, and time. He has normal reflexes. No cranial nerve deficit.  Skin:  Skin is warm and dry. No rash noted.  Psychiatric: He has a normal mood and affect. His behavior is normal. Judgment and thought content normal.  Positive affect. 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   BP 133/80 mmHg  Pulse 94  Temp(Src) 97.3 F (36.3 C) (Oral)  Ht '5\' 10"'$  (1.778 m)  Wt 200 lb (90.719 kg)  BMI 28.70 kg/m2       Assessment & Plan:  1. Uncontrolled type 1 diabetes mellitus with stage 3 chronic kidney disease (HCC) -Continue current insulin therapy pending results of lab work - BMP8+EGFR; Future - CBC with Differential/Platelet; Future - Microalbumin / creatinine urine ratio - Bayer DCA Hb A1c Waived; Future  2. Chronic kidney disease, stage 3, mod decreased GFR -Continue to follow-up with nephrology - BMP8+EGFR; Future - CBC with Differential/Platelet; Future  3. Hypothyroidism, unspecified hypothyroidism type -Continue current thyroid treatment pending results of lab work - CBC  with Differential/Platelet; Future  4. Hyperlipidemia -Continue with aggressive therapeutic lifestyle changes -Continue simvastatin and omega-3 fatty acids - CBC with Differential/Platelet; Future - Hepatic function panel; Future - NMR, lipoprofile; Future  5. Vitamin D deficiency - CBC with Differential/Platelet; Future - VITAMIN D 25 Hydroxy (Vit-D Deficiency, Fractures); Future  6. HIV disease ([place]) -Continue follow-up with infectious disease - CBC with Differential/Platelet; Future  7. Anemia, iron deficiency -Continue iron replacement and follow-up with nephrology  Patient Instructions  Continue current medications. Continue good therapeutic lifestyle changes which include good diet and exercise. Fall precautions discussed with patient. If an FOBT was given today- please return it to our front desk. If you are over 47 years old - you may need Prevnar 45 or the adult Pneumonia vaccine.  **Flu  shots are available--- please call and schedule a FLU-CLINIC appointment**  After your visit with [place] today you will receive a survey in the mail or online from [employer] regarding your care with [place]. Please take a moment to fill this out. Your feedback is very important to [place] as you can help [place] better understand your patient needs as well as improve your experience and satisfaction. WE CARE ABOUT YOU!!!   Continue to follow-up with pulmonology, infectious disease and ophthalmology and nephrology.   Arrie Senate MD

## 2015-08-05 LAB — MICROALBUMIN / CREATININE URINE RATIO
CREATININE, UR: 191.3 mg/dL
MICROALB/CREAT RATIO: 110.4 mg/g{creat} — AB (ref 0.0–30.0)
Microalbumin, Urine: 211.2 ug/mL

## 2015-08-09 ENCOUNTER — Other Ambulatory Visit: Payer: Self-pay | Admitting: *Deleted

## 2015-08-09 ENCOUNTER — Other Ambulatory Visit: Payer: Self-pay | Admitting: Pharmacist

## 2015-08-09 ENCOUNTER — Other Ambulatory Visit: Payer: Medicare Other

## 2015-08-09 DIAGNOSIS — B20 Human immunodeficiency virus [HIV] disease: Secondary | ICD-10-CM

## 2015-08-09 DIAGNOSIS — E1065 Type 1 diabetes mellitus with hyperglycemia: Secondary | ICD-10-CM | POA: Diagnosis not present

## 2015-08-09 DIAGNOSIS — N183 Chronic kidney disease, stage 3 unspecified: Secondary | ICD-10-CM

## 2015-08-09 DIAGNOSIS — E039 Hypothyroidism, unspecified: Secondary | ICD-10-CM

## 2015-08-09 DIAGNOSIS — E785 Hyperlipidemia, unspecified: Secondary | ICD-10-CM

## 2015-08-09 DIAGNOSIS — E559 Vitamin D deficiency, unspecified: Secondary | ICD-10-CM

## 2015-08-09 DIAGNOSIS — E1022 Type 1 diabetes mellitus with diabetic chronic kidney disease: Secondary | ICD-10-CM

## 2015-08-09 DIAGNOSIS — IMO0002 Reserved for concepts with insufficient information to code with codable children: Secondary | ICD-10-CM

## 2015-08-09 LAB — BAYER DCA HB A1C WAIVED: HB A1C: 6.8 % (ref <7.0)

## 2015-08-09 MED ORDER — DOLUTEGRAVIR SODIUM 50 MG PO TABS: 50.0000 mg | ORAL_TABLET | Freq: Every day | ORAL | Status: DC

## 2015-08-09 MED ORDER — ABACAVIR SULFATE-LAMIVUDINE 600-300 MG PO TABS: 1.0000 | ORAL_TABLET | Freq: Every day | ORAL | Status: DC

## 2015-08-10 ENCOUNTER — Other Ambulatory Visit: Payer: Self-pay | Admitting: Physician Assistant

## 2015-08-16 DIAGNOSIS — J9690 Respiratory failure, unspecified, unspecified whether with hypoxia or hypercapnia: Secondary | ICD-10-CM | POA: Diagnosis not present

## 2015-08-17 LAB — BMP8+EGFR
BUN/Creatinine Ratio: 15 (ref 9–20)
BUN: 24 mg/dL (ref 6–24)
CO2: 23 mmol/L (ref 18–29)
CREATININE: 1.56 mg/dL — AB (ref 0.76–1.27)
Calcium: 9.5 mg/dL (ref 8.7–10.2)
Chloride: 100 mmol/L (ref 96–106)
GFR calc non Af Amer: 52 mL/min/{1.73_m2} — ABNORMAL LOW (ref >59)
GFR, EST AFRICAN AMERICAN: 61 mL/min/{1.73_m2} (ref >59)
Glucose: 159 mg/dL — ABNORMAL HIGH (ref 65–99)
POTASSIUM: 3.9 mmol/L (ref 3.5–5.2)
SODIUM: 141 mmol/L (ref 134–144)

## 2015-08-17 LAB — HEPATIC FUNCTION PANEL
ALT: 29 IU/L (ref 0–44)
AST: 28 IU/L (ref 0–40)
Albumin: 4.2 g/dL (ref 3.5–5.5)
Alkaline Phosphatase: 93 IU/L (ref 39–117)
Bilirubin Total: 0.4 mg/dL (ref 0.0–1.2)
Bilirubin, Direct: 0.16 mg/dL (ref 0.00–0.40)
Total Protein: 7.3 g/dL (ref 6.0–8.5)

## 2015-08-17 LAB — CBC WITH DIFFERENTIAL/PLATELET
BASOS: 1 %
Basophils Absolute: 0.1 10*3/uL (ref 0.0–0.2)
EOS (ABSOLUTE): 0.4 10*3/uL (ref 0.0–0.4)
Eos: 5 %
Hematocrit: 41.8 % (ref 37.5–51.0)
Hemoglobin: 14 g/dL (ref 12.6–17.7)
IMMATURE GRANULOCYTES: 1 %
Immature Grans (Abs): 0.1 10*3/uL (ref 0.0–0.1)
LYMPHS ABS: 3.5 10*3/uL — AB (ref 0.7–3.1)
Lymphs: 43 %
MCH: 32.5 pg (ref 26.6–33.0)
MCHC: 33.5 g/dL (ref 31.5–35.7)
MCV: 97 fL (ref 79–97)
MONOS ABS: 0.8 10*3/uL (ref 0.1–0.9)
Monocytes: 9 %
NEUTROS ABS: 3.3 10*3/uL (ref 1.4–7.0)
NEUTROS PCT: 41 %
PLATELETS: 182 10*3/uL (ref 150–379)
RBC: 4.31 x10E6/uL (ref 4.14–5.80)
RDW: 14.8 % (ref 12.3–15.4)
WBC: 8 10*3/uL (ref 3.4–10.8)

## 2015-08-17 LAB — NMR, LIPOPROFILE
Cholesterol: 118 mg/dL (ref 100–199)
HDL CHOLESTEROL BY NMR: 50 mg/dL (ref >39)
HDL Particle Number: 31.4 umol/L (ref >=30.5)
LDL PARTICLE NUMBER: 538 nmol/L (ref <1000)
LDL Size: 20.3 nm (ref >20.5)
LDL-C: 47 mg/dL (ref 0–99)
LP-IR Score: 44 (ref <=45)
Small LDL Particle Number: 306 nmol/L (ref <=527)
TRIGLYCERIDES BY NMR: 103 mg/dL (ref 0–149)

## 2015-08-17 LAB — VITAMIN D 25 HYDROXY (VIT D DEFICIENCY, FRACTURES): VIT D 25 HYDROXY: 37.1 ng/mL (ref 30.0–100.0)

## 2015-08-23 ENCOUNTER — Ambulatory Visit: Payer: BLUE CROSS/BLUE SHIELD | Admitting: Pulmonary Disease

## 2015-08-30 ENCOUNTER — Other Ambulatory Visit: Payer: Self-pay | Admitting: Family Medicine

## 2015-09-05 ENCOUNTER — Telehealth: Payer: Self-pay

## 2015-09-05 DIAGNOSIS — B009 Herpesviral infection, unspecified: Secondary | ICD-10-CM | POA: Diagnosis not present

## 2015-09-05 DIAGNOSIS — H179 Unspecified corneal scar and opacity: Secondary | ICD-10-CM | POA: Diagnosis not present

## 2015-09-05 NOTE — Telephone Encounter (Signed)
Done

## 2015-09-06 ENCOUNTER — Ambulatory Visit: Payer: Medicare Other | Admitting: Family Medicine

## 2015-09-07 ENCOUNTER — Encounter: Payer: Self-pay | Admitting: Family Medicine

## 2015-09-14 ENCOUNTER — Ambulatory Visit: Payer: BLUE CROSS/BLUE SHIELD | Admitting: Pulmonary Disease

## 2015-09-18 ENCOUNTER — Telehealth: Payer: Self-pay | Admitting: *Deleted

## 2015-09-18 ENCOUNTER — Ambulatory Visit (INDEPENDENT_AMBULATORY_CARE_PROVIDER_SITE_OTHER): Payer: Medicare Other | Admitting: Internal Medicine

## 2015-09-18 ENCOUNTER — Encounter: Payer: Self-pay | Admitting: Internal Medicine

## 2015-09-18 VITALS — BP 157/93 | HR 81 | Temp 97.3°F | Wt 203.0 lb

## 2015-09-18 DIAGNOSIS — N183 Chronic kidney disease, stage 3 unspecified: Secondary | ICD-10-CM

## 2015-09-18 DIAGNOSIS — B20 Human immunodeficiency virus [HIV] disease: Secondary | ICD-10-CM

## 2015-09-18 DIAGNOSIS — Z113 Encounter for screening for infections with a predominantly sexual mode of transmission: Secondary | ICD-10-CM

## 2015-09-18 MED ORDER — ABACAVIR-DOLUTEGRAVIR-LAMIVUD 600-50-300 MG PO TABS: 1.0000 | ORAL_TABLET | Freq: Every day | ORAL | 11 refills | Status: DC

## 2015-09-18 NOTE — Assessment & Plan Note (Signed)
Has been stable and monitored by Dr. Laurance Flatten.

## 2015-09-18 NOTE — Telephone Encounter (Signed)
Patient's pharmacist called to say that the copay for Truimeq is $1,000.00. Patient given copay card and he will call us back if the copay is still too high.

## 2015-09-18 NOTE — Assessment & Plan Note (Addendum)
Doing great.  Will have him change to the single tablet Triumeq since he creat has been stable.  Lipids and cmp monitored by Dr. Laurance Flatten RTC 6 months unless concerns.

## 2015-09-18 NOTE — Progress Notes (Signed)
  Subjective:    Patient ID: Patrick Brown, male    DOB: 11-Jan-1971, 45 y.o.   MRN: [id number]  HPI Is in for followup of HIV.   He continues on Tivicay and Epzicom.  He denies any missed doses of his medications.  Last CD4 and viral load reviewed and was 360 and vl remains undetectable. Creat followed by Dr. Laurance Flatten and last was 1.56.  He also reports he recent Hgb A1C was under 7.     Review of Systems  Constitutional: Negative for fever.  HENT: Negative for sore throat.   Eyes: Negative for visual disturbance.  Respiratory: Negative for shortness of breath.   Gastrointestinal: Negative for abdominal pain, diarrhea and nausea.  Skin: Negative for rash.  Neurological: Negative for dizziness.       Objective:   Physical Exam  Constitutional: He appears well-developed and well-nourished. No distress.  HENT:  Mouth/Throat: No oropharyngeal exudate.  Eyes: No scleral icterus.  Cardiovascular: Normal rate, regular rhythm and normal heart sounds.   No murmur heard. Pulmonary/Chest: Effort normal and breath sounds normal. No respiratory distress.  Lymphadenopathy:    He has no cervical adenopathy.  Skin: No rash noted.          Assessment & Plan:

## 2015-09-20 LAB — HIV-1 RNA QUANT-NO REFLEX-BLD
HIV 1 RNA Quant: <20 copies/mL (ref <20)
HIV-1 RNA Quant, Log: <1.3 Log copies/mL (ref <1.30)

## 2015-09-20 LAB — T-HELPER CELL (CD4) - (RCID CLINIC ONLY)
CD4 % Helper T Cell: 12 % — ABNORMAL LOW (ref 33–55)
CD4 T CELL ABS: 380 /uL — AB (ref 400–2700)

## 2015-09-22 ENCOUNTER — Encounter: Payer: Self-pay | Admitting: Family Medicine

## 2015-09-22 ENCOUNTER — Ambulatory Visit (INDEPENDENT_AMBULATORY_CARE_PROVIDER_SITE_OTHER): Payer: Medicare Other | Admitting: Family Medicine

## 2015-09-22 VITALS — BP 136/88 | HR 98 | Temp 98.9°F | Ht 70.0 in | Wt 211.0 lb

## 2015-09-22 DIAGNOSIS — L03012 Cellulitis of left finger: Secondary | ICD-10-CM | POA: Diagnosis not present

## 2015-09-22 MED ORDER — DOXYCYCLINE HYCLATE 100 MG PO TABS: 100.0000 mg | ORAL_TABLET | Freq: Two times a day (BID) | ORAL | 0 refills | Status: DC

## 2015-09-22 NOTE — Progress Notes (Signed)
BP 136/88   Pulse 98   Temp 98.9 F (37.2 C) (Oral)   Ht 5\' 10"  (1.778 m)   Wt 211 lb (95.7 kg)   BMI 30.28 kg/m    Subjective:    Patient ID: Patrick Brown, male    DOB: 23-Apr-1970, 45 y.o.   MRN: [id number]  HPI: Patrick Brown is a 45 y.o. male presenting on 09/22/2015 for left pointer finger (red, warm and edema)   HPI Skin infection on left index finger Patient is coming in today because he has had skin infections or develop a couple days ago on his left index finger. He has had both pain and swelling and redness over that finger on the lateral aspect. He has both diabetes and HIV which are both controlled but put him at risk for infections. He denies any fevers or chills or myalgias. He does not have any rashes or infections anywhere else on his body.  Relevant past medical, surgical, family and social history reviewed and updated as indicated. Interim medical history since our last visit reviewed. Allergies and medications reviewed and updated.  Review of Systems  Constitutional: Negative for chills and fever.  HENT: Negative for ear discharge and ear pain.   Eyes: Negative for discharge and visual disturbance.  Respiratory: Negative for shortness of breath and wheezing.   Cardiovascular: Negative for chest pain and leg swelling.  Gastrointestinal: Negative for abdominal pain, constipation and diarrhea.  Genitourinary: Negative for difficulty urinating.  Musculoskeletal: Negative for back pain and gait problem.  Skin: Positive for color change. Negative for rash.  Neurological: Negative for syncope, light-headedness and headaches.  All other systems reviewed and are negative.   Per HPI unless specifically indicated above     Medication List       Accurate as of 09/22/15  2:52 PM. Always use your most recent med list.          abacavir-dolutegravir-lamiVUDine 600-50-300 MG tablet Commonly known as:  TRIUMEQ Take 1 tablet by mouth daily.   acetaminophen 500 MG  tablet Commonly known as:  TYLENOL Take 1,000 mg by mouth 2 (two) times daily.   acyclovir 400 MG tablet Commonly known as:  ZOVIRAX TAKE 1 TABLET (400 MG TOTAL) BY MOUTH 2 (TWO) TIMES DAILY.   ANDROGEL PUMP 20.25 MG/ACT (1.62%) Gel Generic drug:  Testosterone APPLY 2 PUMPS ONCE DAILY AS DIRECTED   aspirin 81 MG chewable tablet Chew 81 mg by mouth every morning.   colchicine 0.6 MG tablet Take 0.6 mg by mouth as needed.   doxycycline 100 MG tablet Commonly known as:  VIBRA-TABS Take 1 tablet (100 mg total) by mouth 2 (two) times daily. 1 po bid   DULoxetine 60 MG capsule Commonly known as:  CYMBALTA Take 1 capsule (60 mg total) by mouth 2 (two) times daily. As directed   esomeprazole 40 MG capsule Commonly known as:  NEXIUM TAKE ONE CAPSULE BY MOUTH DAILY   ferrous sulfate 325 (65 FE) MG tablet Take 650 mg by mouth daily with breakfast.   fludrocortisone 0.1 MG tablet Commonly known as:  FLORINEF Take 0.2 mg by mouth daily.   fluticasone 50 MCG/ACT nasal spray Commonly known as:  FLONASE Place 2 sprays into both nostrils daily.   furosemide 40 MG tablet Commonly known as:  LASIX Take 20 mg by mouth 2 (two) times daily.   GLUCOSAMINE 1500 COMPLEX PO Take 1 tablet by mouth 2 (two) times daily.   levothyroxine 175 MCG tablet Commonly known as:  SYNTHROID, LEVOTHROID Take 1 tablet (175 mcg total) by mouth daily before breakfast.   loratadine 10 MG tablet Commonly known as:  CLARITIN Take 10 mg by mouth daily as needed for allergies.   LORazepam 0.5 MG tablet Commonly known as:  ATIVAN TAKE 1 TABLET BY MOUTH EVERY DAY AS NEEDED   metoCLOPramide 5 MG tablet Commonly known as:  REGLAN TAKE 1 TABLET BY MOUTH 3 TIMES A DAY BEFORE MEALS   niacin 1000 MG CR tablet Commonly known as:  NIASPAN TAKE 1 TABLET (1,000 MG TOTAL) BY MOUTH AT BEDTIME.   NOVOLOG 100 UNIT/ML injection Generic drug:  insulin aspart USE PER PUMP AS DIRECTED   omega-3 acid ethyl esters  1 g capsule Commonly known as:  LOVAZA TAKE 2 CAPSULES (2 G TOTAL) BY MOUTH 2 (TWO) TIMES DAILY.   ondansetron 4 MG tablet Commonly known as:  ZOFRAN Take 1 tablet (4 mg total) by mouth every 8 (eight) hours as needed for nausea.   OVER THE COUNTER MEDICATION Take 1 capsule by mouth daily. [name]- probiotic daily   simvastatin 40 MG tablet Commonly known as:  ZOCOR TAKE 1 TABLET (40 MG TOTAL) BY MOUTH AT BEDTIME.   traMADol 50 MG tablet Commonly known as:  ULTRAM TAKE 1 TABLET BY MOUTH EVERY 6 HOURS AS NEEDED FOR PAIN   ULORIC 40 MG tablet Generic drug:  febuxostat TAKE 1 TABLET BY MOUTH EVERY DAY   zolpidem 5 MG tablet Commonly known as:  AMBIEN Take 1 tablet (5 mg total) by mouth at bedtime as needed for sleep.          Objective:    BP 136/88   Pulse 98   Temp 98.9 F (37.2 C) (Oral)   Ht 5\' 10"  (1.778 m)   Wt 211 lb (95.7 kg)   BMI 30.28 kg/m   Wt Readings from Last 3 Encounters:  09/22/15 211 lb (95.7 kg)  09/18/15 203 lb (92.1 kg)  08/04/15 200 lb (90.7 kg)    Physical Exam  Constitutional: He is oriented to person, place, and time. He appears well-developed and well-nourished. No distress.  Eyes: Conjunctivae and EOM are normal. Pupils are equal, round, and reactive to light. Right eye exhibits no discharge. No scleral icterus.  Neck: Neck supple. No thyromegaly present.  Cardiovascular: Normal rate, regular rhythm, normal heart sounds and intact distal pulses.   No murmur heard. Pulmonary/Chest: Effort normal and breath sounds normal. No respiratory distress. He has no wheezes.  Musculoskeletal: Normal range of motion. He exhibits no edema.  Lymphadenopathy:    He has no cervical adenopathy.  Neurological: He is alert and oriented to person, place, and time. Coordination normal.  Skin: Skin is warm and dry. No rash noted. He is not diaphoretic. There is erythema (Patient has swelling and erythema over the lateral aspect of his left index finger. The  swelling extends over into the anterior aspect of the digit as well. He has full range of motion and good capillary refill and sensation is intact).  Psychiatric: He has a normal mood and affect. His behavior is normal.  Nursing note and vitals reviewed. Swelling does not extend into the patient's palm     Assessment & Plan:   Problem List Items Addressed This Visit    None    Visit Diagnoses    Cellulitis of left index finger    -  Primary       Follow up plan: Return if symptoms worsen or fail to improve.  Counseling provided for all of the vaccine components No orders of the defined types were placed in this encounter.   Caryl Pina, MD [facility] 09/22/2015, 2:52 PM

## 2015-09-25 ENCOUNTER — Other Ambulatory Visit: Payer: Self-pay | Admitting: Family Medicine

## 2015-10-01 ENCOUNTER — Other Ambulatory Visit: Payer: Self-pay | Admitting: Family Medicine

## 2015-10-03 ENCOUNTER — Telehealth: Payer: Self-pay | Admitting: *Deleted

## 2015-10-03 ENCOUNTER — Other Ambulatory Visit: Payer: Self-pay | Admitting: Pharmacist Clinician (PhC)/ Clinical Pharmacy Specialist

## 2015-10-03 MED ORDER — DOLUTEGRAVIR SODIUM 50 MG PO TABS: 50.0000 mg | ORAL_TABLET | Freq: Every day | ORAL | 6 refills | Status: DC

## 2015-10-03 MED ORDER — ABACAVIR SULFATE-LAMIVUDINE 600-300 MG PO TABS: 1.0000 | ORAL_TABLET | Freq: Every day | ORAL | 6 refills | Status: DC

## 2015-10-03 NOTE — Progress Notes (Signed)
Apparently, insurance won't cover Triumeq for some reason. Scripts changed back to Epzicom and DTG. Rx sent.

## 2015-10-03 NOTE — Telephone Encounter (Signed)
Patient's Part D Medicare does not cover Triumeq, co-pay card doesn't work with Medicare.  The patient wants to be changed back to generic Epzicom and Tivicay.  Uses [facility] mail order.  Please send prescriptions to [facility].

## 2015-10-05 NOTE — Progress Notes (Signed)
Ok, thanks.

## 2015-10-11 DIAGNOSIS — H35352 Cystoid macular degeneration, left eye: Secondary | ICD-10-CM | POA: Diagnosis not present

## 2015-10-11 DIAGNOSIS — H26492 Other secondary cataract, left eye: Secondary | ICD-10-CM | POA: Diagnosis not present

## 2015-10-11 DIAGNOSIS — E103592 Type 1 diabetes mellitus with proliferative diabetic retinopathy without macular edema, left eye: Secondary | ICD-10-CM | POA: Diagnosis not present

## 2015-10-11 DIAGNOSIS — H26491 Other secondary cataract, right eye: Secondary | ICD-10-CM | POA: Diagnosis not present

## 2015-11-09 ENCOUNTER — Other Ambulatory Visit: Payer: Self-pay | Admitting: Family Medicine

## 2015-11-10 ENCOUNTER — Ambulatory Visit (INDEPENDENT_AMBULATORY_CARE_PROVIDER_SITE_OTHER): Payer: Medicare Other

## 2015-11-10 DIAGNOSIS — Z23 Encounter for immunization: Secondary | ICD-10-CM | POA: Diagnosis not present

## 2015-11-22 ENCOUNTER — Other Ambulatory Visit: Payer: Self-pay | Admitting: Family Medicine

## 2015-12-06 ENCOUNTER — Ambulatory Visit (INDEPENDENT_AMBULATORY_CARE_PROVIDER_SITE_OTHER): Payer: Medicare Other | Admitting: Family Medicine

## 2015-12-06 ENCOUNTER — Ambulatory Visit (INDEPENDENT_AMBULATORY_CARE_PROVIDER_SITE_OTHER): Payer: Medicare Other

## 2015-12-06 ENCOUNTER — Encounter: Payer: Self-pay | Admitting: Family Medicine

## 2015-12-06 VITALS — BP 142/92 | HR 82 | Temp 97.7°F | Ht 70.0 in | Wt 203.0 lb

## 2015-12-06 DIAGNOSIS — B20 Human immunodeficiency virus [HIV] disease: Secondary | ICD-10-CM | POA: Diagnosis not present

## 2015-12-06 DIAGNOSIS — N183 Chronic kidney disease, stage 3 unspecified: Secondary | ICD-10-CM

## 2015-12-06 DIAGNOSIS — J00 Acute nasopharyngitis [common cold]: Secondary | ICD-10-CM

## 2015-12-06 DIAGNOSIS — IMO0002 Reserved for concepts with insufficient information to code with codable children: Secondary | ICD-10-CM

## 2015-12-06 DIAGNOSIS — E1065 Type 1 diabetes mellitus with hyperglycemia: Secondary | ICD-10-CM

## 2015-12-06 DIAGNOSIS — E7800 Pure hypercholesterolemia, unspecified: Secondary | ICD-10-CM

## 2015-12-06 DIAGNOSIS — K3184 Gastroparesis: Secondary | ICD-10-CM | POA: Diagnosis not present

## 2015-12-06 DIAGNOSIS — E559 Vitamin D deficiency, unspecified: Secondary | ICD-10-CM

## 2015-12-06 DIAGNOSIS — E1022 Type 1 diabetes mellitus with diabetic chronic kidney disease: Secondary | ICD-10-CM | POA: Diagnosis not present

## 2015-12-06 DIAGNOSIS — R569 Unspecified convulsions: Secondary | ICD-10-CM | POA: Diagnosis not present

## 2015-12-06 MED ORDER — GLUCOSE BLOOD VI STRP: ORAL_STRIP | 11 refills | Status: DC

## 2015-12-06 MED ORDER — ONDANSETRON HCL 4 MG PO TABS: 4.0000 mg | ORAL_TABLET | Freq: Three times a day (TID) | ORAL | 0 refills | Status: DC | PRN

## 2015-12-06 MED ORDER — LANCETS MISC: 11 refills | Status: DC

## 2015-12-06 NOTE — Patient Instructions (Addendum)
Medicare Annual Wellness Visit  [facility] and the medical providers at [facility] strive to bring you the best medical care.  In doing so we not only want to address your current medical conditions and concerns but also to detect new conditions early and prevent illness, disease and health-related problems.    Medicare offers a yearly Wellness Visit which allows our clinical staff to assess your need for preventative services including immunizations, lifestyle education, counseling to decrease risk of preventable diseases and screening for fall risk and other medical concerns.    This visit is provided free of charge (no copay) for all Medicare recipients. The clinical pharmacists at [facility] have begun to conduct these Wellness Visits which will also include a thorough review of all your medications.    As you primary medical provider recommend that you make an appointment for your Annual Wellness Visit if you have not done so already this year.  You may set up this appointment before you leave today or you may call back ([phone]) and schedule an appointment.  Please make sure when you call that you mention that you are scheduling your Annual Wellness Visit with the clinical pharmacist so that the appointment may be made for the proper length of time.     Continue current medications. Continue good therapeutic lifestyle changes which include good diet and exercise. Fall precautions discussed with patient. If an FOBT was given today- please return it to our front desk. If you are over 31 years old - you may need Prevnar 25 or the adult Pneumonia vaccine.  **Flu shots are available--- please call and schedule a FLU-CLINIC appointment**  After your visit with [place] today you will receive a survey in the mail or online from [employer] regarding your care with [place]. Please take a moment to fill this out. Your feedback is very  important to [place] as you can help [place] better understand your patient needs as well as improve your experience and satisfaction. WE CARE ABOUT YOU!!!   The patient should follow-up with infectious disease and nephrology as planned He should come by the office in a couple weeks and have the nurse recheck his blood pressure and record readings outside and bring these with him to that visit This winter he should drink plenty of fluids and stay well hydrated and avoid the use of overhead fans

## 2015-12-06 NOTE — Progress Notes (Signed)
Subjective:    Patient ID: Patrick Brown, male    DOB: 1970/05/18, 45 y.o.   MRN: [id number]  HPI Pt here for follow up and management of chronic medical problems which includes diabetes and CKD. He is taking medications regularly.The patient is followed by [facility] nephrology and Dr. Antionette Fairy in [facility] for his chronic kidney disease. He is also followed by Dr. coma in [place] for his infectious disease issues and HIV. 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 He denies any chest pain or shortness of breath anymore than usual he has had a recent upper respiratory infection and is taking doxycycline and since he's been taking this he is feeling better and her sugars have come down. He denies any problems with his stomach including nausea vomiting diarrhea blood in the stool or black tarry bowel movements. He is passing his water without problems. He is requesting refills on Zofran and lancets and test strips. He also had a study for gastroparesis and needs to have this study repeated and he is past due on this and we will make sure this gets schedule for him.     Patient Active Problem List   Diagnosis Date Noted  . OSA (obstructive sleep apnea) 10/06/2014  . Screening examination for venereal disease 12/23/2013  . Pain in joint, lower leg 11/19/2012  . Unspecified sinusitis (chronic) 09/16/2012  . Itch 08/17/2012  . Right groin wound 08/09/2012  . HIV disease ([place]) 06/02/2012  . Convulsions/seizures ([place]) 05/16/2012  . Pulmonary infiltrate 04/18/2012  . Gastroparesis 04/18/2012  . Anemia, iron deficiency   . DM (diabetes mellitus), type 1, uncontrolled ([place]) 05/08/2011  . Hypothyroid 05/08/2011  . Chronic kidney disease, stage 3, mod decreased GFR 12/31/2010   Outpatient Encounter Prescriptions as of 12/06/2015  Medication Sig  . abacavir-lamiVUDine (EPZICOM) 600-300 MG tablet Take 1 tablet by mouth daily.  Marland Kitchen acetaminophen  (TYLENOL) 500 MG tablet Take 1,000 mg by mouth 2 (two) times daily.   Marland Kitchen acyclovir (ZOVIRAX) 400 MG tablet TAKE 1 TABLET (400 MG TOTAL) BY MOUTH 2 (TWO) TIMES DAILY.  Marland Kitchen ANDROGEL PUMP 20.25 MG/ACT (1.62%) GEL APPLY 2 PUMPS AS DIRECTED DAILY  . aspirin 81 MG chewable tablet Chew 81 mg by mouth every morning.  . colchicine 0.6 MG tablet Take 0.6 mg by mouth as needed.  . dolutegravir (TIVICAY) 50 MG tablet Take 1 tablet (50 mg total) by mouth daily.  . DULoxetine (CYMBALTA) 60 MG capsule TAKE 1 CAPSULE (60 MG TOTAL) BY MOUTH 2 (TWO) TIMES DAILY. AS DIRECTED  . esomeprazole (NEXIUM) 40 MG capsule TAKE ONE CAPSULE BY MOUTH DAILY  . ferrous sulfate 325 (65 FE) MG tablet Take 650 mg by mouth daily with breakfast.   . fludrocortisone (FLORINEF) 0.1 MG tablet Take 0.2 mg by mouth daily.  . fluticasone (FLONASE) 50 MCG/ACT nasal spray Place 2 sprays into both nostrils daily.  . furosemide (LASIX) 40 MG tablet Take 20 mg by mouth 2 (two) times daily.  . Glucosamine-Chondroit-Vit C-Mn (GLUCOSAMINE 1500 COMPLEX PO) Take 1 tablet by mouth 2 (two) times daily.   Marland Kitchen levothyroxine (SYNTHROID, LEVOTHROID) 175 MCG tablet Take 1 tablet (175 mcg total) by mouth daily before breakfast.  . loratadine (CLARITIN) 10 MG tablet Take 10 mg by mouth daily as needed for allergies.  Marland Kitchen LORazepam (ATIVAN) 0.5 MG tablet TAKE 1 TABLET BY MOUTH EVERY DAY AS NEEDED  . metoCLOPramide (REGLAN) 5 MG tablet TAKE 1 TABLET  BY MOUTH 3 TIMES A DAY BEFORE MEALS  . niacin (NIASPAN) 1000 MG CR tablet TAKE 1 TABLET (1,000 MG TOTAL) BY MOUTH AT BEDTIME.  Marland Kitchen omega-3 acid ethyl esters (LOVAZA) 1 g capsule TAKE 2 CAPSULES (2 G TOTAL) BY MOUTH 2 (TWO) TIMES DAILY.  Marland Kitchen ondansetron (ZOFRAN) 4 MG tablet Take 1 tablet (4 mg total) by mouth every 8 (eight) hours as needed for nausea.  Marland Kitchen OVER THE COUNTER MEDICATION Take 1 capsule by mouth daily. [name]- probiotic daily  . simvastatin (ZOCOR) 40 MG tablet TAKE 1 TABLET (40 MG TOTAL) BY MOUTH AT BEDTIME.  .  traMADol (ULTRAM) 50 MG tablet TAKE 1 TABLET BY MOUTH EVERY 6 HOURS AS NEEDED FOR PAIN  . ULORIC 40 MG tablet TAKE 1 TABLET BY MOUTH EVERY DAY  . [DISCONTINUED] doxycycline (VIBRA-TABS) 100 MG tablet Take 1 tablet (100 mg total) by mouth 2 (two) times daily. 1 po bid  . [DISCONTINUED] NOVOLOG 100 UNIT/ML injection USE PER PUMP AS DIRECTED  . HUMALOG 100 UNIT/ML injection   . [DISCONTINUED] zolpidem (AMBIEN) 5 MG tablet Take 1 tablet (5 mg total) by mouth at bedtime as needed for sleep.   No facility-administered encounter medications on file as of 12/06/2015.      Review of Systems  Constitutional: Negative.   HENT: Negative.   Eyes: Negative.   Respiratory: Negative.   Cardiovascular: Negative.   Gastrointestinal: Negative.   Endocrine: Negative.   Genitourinary: Negative.   Musculoskeletal: Negative.   Skin: Negative.   Allergic/Immunologic: Negative.   Neurological: Negative.   Hematological: Negative.   Psychiatric/Behavioral: Negative.        Objective:   Physical Exam  Constitutional: He is oriented to person, place, and time. He appears well-developed and well-nourished. No distress.  HENT:  Head: Normocephalic and atraumatic.  Right Ear: External ear normal.  Left Ear: External ear normal.  Nose: Nose normal.  Mouth/Throat: Oropharynx is clear and moist. No oropharyngeal exudate.  Nasal passages are clear and throat is clear  Eyes: Conjunctivae and EOM are normal. Pupils are equal, round, and reactive to light. Right eye exhibits no discharge. Left eye exhibits no discharge. No scleral icterus.  Neck: Normal range of motion. Neck supple. No thyromegaly present.  No anterior cervical adenopathy or bruits  Cardiovascular: Normal rate, regular rhythm, normal heart sounds and intact distal pulses.   No murmur heard. The heart has a regular rate and rhythm at 84/m  Pulmonary/Chest: Effort normal and breath sounds normal. No respiratory distress. He has no wheezes. He has  no rales.  Clear anteriorly and posteriorly and no axillary adenopathy  Abdominal: Soft. Bowel sounds are normal. He exhibits no mass. There is no tenderness. There is no rebound and no guarding.  No liver or spleen enlargement no bruits and no suprapubic tenderness  Musculoskeletal: Normal range of motion. He exhibits no edema.  Lymphadenopathy:    He has no cervical adenopathy.  Neurological: He is alert and oriented to person, place, and time. He has normal reflexes. No cranial nerve deficit.  Skin: Skin is warm and dry. No rash noted.  Minimal rash and erythema on left arm.  Psychiatric: He has a normal mood and affect. His behavior is normal. Judgment and thought content normal.  Nursing note and vitals reviewed.   BP (!) 141/97   Pulse 82   Temp 97.7 F (36.5 C) (Oral)   Ht '5\' 10"'$  (1.778 m)   Wt 203 lb (92.1 kg)   BMI 29.13 kg/m  Repeat blood pressure right arm large cuff sitting was 142/92.     Assessment & Plan:  1. Uncontrolled type 1 diabetes mellitus with stage 3 chronic kidney disease (HCC) -Check hemoglobin A1c and continue current treatment pending results of lab work - [facility] Hb A1c Waived; Future - BMP8+EGFR; Future - CBC with Differential/Platelet; Future - Lancets MISC; Test BS QID and PRN. E10.65  Dispense: 100 each; Refill: 11  2. Pure hypercholesterolemia -Continue current treatment pending results of lab work - BMP8+EGFR; Future - CBC with Differential/Platelet; Future - Hepatic function panel; Future - NMR, lipoprofile; Future  3. Chronic kidney disease, stage 3, mod decreased GFR -Follow-up with nephrology. Return to this office for blood pressure rechecked in a couple weeks and bring readings from the outside when you come back - BMP8+EGFR; Future - CBC with Differential/Platelet; Future  4. Vitamin D deficiency -Continue current treatment pending results of lab work - CBC with Differential/Platelet; Future - VITAMIN D 25 Hydroxy (Vit-D  Deficiency, Fractures); Future  5. Gastric paresis -This study is past due and was supposed to be done sooner and we will schedule it now - NM Gastric Emptying; Future  6. Acute nasopharyngitis -Continue and complete doxycycline drink plenty of fluids and stay well hydrated  7. HIV disease ([place]) -Follow-up with infectious disease  8. Convulsions, unspecified convulsion type ([place]) -Currently no history of any convulsions.  Meds ordered this encounter  Medications  . HUMALOG 100 UNIT/ML injection  . ondansetron (ZOFRAN) 4 MG tablet    Sig: Take 1 tablet (4 mg total) by mouth every 8 (eight) hours as needed for nausea.    Dispense:  45 tablet    Refill:  0  . glucose blood test strip    Sig: Test BS QID and PRN.    Dispense:  100 each    Refill:  11    onetouch verio E10.65  . Lancets MISC    Sig: Test BS QID and PRN. E10.65    Dispense:  100 each    Refill:  11    Aqua-Matrix lancets   Patient Instructions                       Medicare Annual Wellness Visit  [facility] and the medical providers at [facility] strive to bring you the best medical care.  In doing so we not only want to address your current medical conditions and concerns but also to detect new conditions early and prevent illness, disease and health-related problems.    Medicare offers a yearly Wellness Visit which allows our clinical staff to assess your need for preventative services including immunizations, lifestyle education, counseling to decrease risk of preventable diseases and screening for fall risk and other medical concerns.    This visit is provided free of charge (no copay) for all Medicare recipients. The clinical pharmacists at [facility] have begun to conduct these Wellness Visits which will also include a thorough review of all your medications.    As you primary medical provider recommend that you make an appointment for your Annual Wellness  Visit if you have not done so already this year.  You may set up this appointment before you leave today or you may call back ([phone]) and schedule an appointment.  Please make sure when you call that you mention that you are scheduling your Annual Wellness Visit with the clinical pharmacist so that the appointment may be made for  the proper length of time.     Continue current medications. Continue good therapeutic lifestyle changes which include good diet and exercise. Fall precautions discussed with patient. If an FOBT was given today- please return it to our front desk. If you are over 47 years old - you may need Prevnar 16 or the adult Pneumonia vaccine.  **Flu shots are available--- please call and schedule a FLU-CLINIC appointment**  After your visit with [place] today you will receive a survey in the mail or online from [employer] regarding your care with [place]. Please take a moment to fill this out. Your feedback is very important to [place] as you can help [place] better understand your patient needs as well as improve your experience and satisfaction. WE CARE ABOUT YOU!!!   The patient should follow-up with infectious disease and nephrology as planned He should come by the office in a couple weeks and have the nurse recheck his blood pressure and record readings outside and bring these with him to that visit This winter he should drink plenty of fluids and stay well hydrated and avoid the use of overhead fans  Arrie Senate MD

## 2015-12-06 NOTE — Addendum Note (Signed)
Addended by: Zannie Cove on: 12/06/2015 02:42 PM   Modules accepted: Orders

## 2015-12-08 ENCOUNTER — Other Ambulatory Visit: Payer: Self-pay | Admitting: Family Medicine

## 2015-12-12 ENCOUNTER — Ambulatory Visit: Payer: BLUE CROSS/BLUE SHIELD | Admitting: Pulmonary Disease

## 2015-12-15 ENCOUNTER — Other Ambulatory Visit: Payer: Medicare Other

## 2015-12-15 DIAGNOSIS — N183 Chronic kidney disease, stage 3 unspecified: Secondary | ICD-10-CM

## 2015-12-15 DIAGNOSIS — E1065 Type 1 diabetes mellitus with hyperglycemia: Principal | ICD-10-CM

## 2015-12-15 DIAGNOSIS — E1022 Type 1 diabetes mellitus with diabetic chronic kidney disease: Secondary | ICD-10-CM

## 2015-12-15 DIAGNOSIS — E7800 Pure hypercholesterolemia, unspecified: Secondary | ICD-10-CM | POA: Diagnosis not present

## 2015-12-15 DIAGNOSIS — E559 Vitamin D deficiency, unspecified: Secondary | ICD-10-CM | POA: Diagnosis not present

## 2015-12-15 DIAGNOSIS — IMO0002 Reserved for concepts with insufficient information to code with codable children: Secondary | ICD-10-CM

## 2015-12-15 LAB — BAYER DCA HB A1C WAIVED: HB A1C (BAYER DCA - WAIVED): 6.5 % (ref <7.0)

## 2015-12-16 LAB — BMP8+EGFR
BUN/Creatinine Ratio: 11 (ref 9–20)
BUN: 20 mg/dL (ref 6–24)
CO2: 27 mmol/L (ref 18–29)
CREATININE: 1.86 mg/dL — AB (ref 0.76–1.27)
Calcium: 9.4 mg/dL (ref 8.7–10.2)
Chloride: 96 mmol/L (ref 96–106)
GFR calc Af Amer: 49 mL/min/{1.73_m2} — ABNORMAL LOW (ref >59)
GFR, EST NON AFRICAN AMERICAN: 42 mL/min/{1.73_m2} — AB (ref >59)
GLUCOSE: 212 mg/dL — AB (ref 65–99)
Potassium: 3.9 mmol/L (ref 3.5–5.2)
Sodium: 140 mmol/L (ref 134–144)

## 2015-12-16 LAB — VITAMIN D 25 HYDROXY (VIT D DEFICIENCY, FRACTURES): Vit D, 25-Hydroxy: 41.5 ng/mL (ref 30.0–100.0)

## 2015-12-16 LAB — NMR, LIPOPROFILE
CHOLESTEROL: 142 mg/dL (ref 100–199)
HDL Cholesterol by NMR: 39 mg/dL — ABNORMAL LOW (ref >39)
HDL Particle Number: 30.6 umol/L (ref >=30.5)
LDL PARTICLE NUMBER: 894 nmol/L (ref <1000)
LDL SIZE: 20.3 nm (ref >20.5)
LDL-C: 66 mg/dL (ref 0–99)
LP-IR SCORE: 57 — AB (ref <=45)
SMALL LDL PARTICLE NUMBER: 450 nmol/L (ref <=527)
TRIGLYCERIDES BY NMR: 184 mg/dL — AB (ref 0–149)

## 2015-12-16 LAB — CBC WITH DIFFERENTIAL/PLATELET
Basophils Absolute: 0.1 10*3/uL (ref 0.0–0.2)
Basos: 1 %
EOS (ABSOLUTE): 0.3 10*3/uL (ref 0.0–0.4)
EOS: 4 %
HEMATOCRIT: 41.7 % (ref 37.5–51.0)
HEMOGLOBIN: 14.3 g/dL (ref 12.6–17.7)
IMMATURE GRANS (ABS): 0 10*3/uL (ref 0.0–0.1)
IMMATURE GRANULOCYTES: 1 %
Lymphocytes Absolute: 2.8 10*3/uL (ref 0.7–3.1)
Lymphs: 35 %
MCH: 33.2 pg — ABNORMAL HIGH (ref 26.6–33.0)
MCHC: 34.3 g/dL (ref 31.5–35.7)
MCV: 97 fL (ref 79–97)
MONOCYTES: 10 %
Monocytes Absolute: 0.8 10*3/uL (ref 0.1–0.9)
NEUTROS PCT: 49 %
Neutrophils Absolute: 3.9 10*3/uL (ref 1.4–7.0)
Platelets: 265 10*3/uL (ref 150–379)
RBC: 4.31 x10E6/uL (ref 4.14–5.80)
RDW: 13.3 % (ref 12.3–15.4)
WBC: 7.9 10*3/uL (ref 3.4–10.8)

## 2015-12-16 LAB — HEPATIC FUNCTION PANEL
ALBUMIN: 4.2 g/dL (ref 3.5–5.5)
ALK PHOS: 118 IU/L — AB (ref 39–117)
ALT: 28 IU/L (ref 0–44)
AST: 29 IU/L (ref 0–40)
BILIRUBIN TOTAL: 0.4 mg/dL (ref 0.0–1.2)
BILIRUBIN, DIRECT: 0.15 mg/dL (ref 0.00–0.40)
Total Protein: 7.1 g/dL (ref 6.0–8.5)

## 2015-12-19 ENCOUNTER — Encounter (HOSPITAL_COMMUNITY): Payer: Medicare Other

## 2015-12-20 DIAGNOSIS — N183 Chronic kidney disease, stage 3 (moderate): Secondary | ICD-10-CM | POA: Diagnosis not present

## 2015-12-26 ENCOUNTER — Telehealth: Payer: Self-pay | Admitting: Family Medicine

## 2015-12-26 NOTE — Telephone Encounter (Signed)
FYI  Florinef was causing BP problem Dose was cut to 1/2 daily

## 2015-12-27 DIAGNOSIS — J9690 Respiratory failure, unspecified, unspecified whether with hypoxia or hypercapnia: Secondary | ICD-10-CM | POA: Diagnosis not present

## 2015-12-27 DIAGNOSIS — R0902 Hypoxemia: Secondary | ICD-10-CM | POA: Diagnosis not present

## 2015-12-27 DIAGNOSIS — G4733 Obstructive sleep apnea (adult) (pediatric): Secondary | ICD-10-CM | POA: Diagnosis not present

## 2015-12-30 ENCOUNTER — Encounter: Payer: Self-pay | Admitting: Family Medicine

## 2015-12-30 ENCOUNTER — Ambulatory Visit (INDEPENDENT_AMBULATORY_CARE_PROVIDER_SITE_OTHER): Payer: Medicare Other | Admitting: Family Medicine

## 2015-12-30 VITALS — BP 144/104 | HR 98 | Temp 97.8°F | Ht 70.0 in | Wt 203.0 lb

## 2015-12-30 DIAGNOSIS — J302 Other seasonal allergic rhinitis: Secondary | ICD-10-CM | POA: Diagnosis not present

## 2015-12-30 MED ORDER — METHYLPREDNISOLONE ACETATE 80 MG/ML IJ SUSP
80.0000 mg | Freq: Once | INTRAMUSCULAR | Status: AC
  Administered 2015-12-30: 80 mg via INTRAMUSCULAR

## 2015-12-30 NOTE — Progress Notes (Signed)
BP (!) 164/107   Pulse 98   Temp 97.8 F (36.6 C)   Ht 5\' 10"  (1.778 m)   Wt 203 lb (92.1 kg)   BMI 29.13 kg/m    Subjective:    Patient ID: Patrick Brown, male    DOB: Jul 06, 1970, 45 y.o.   MRN: [id number]  HPI: Patrick Brown is a 45 y.o. male presenting on 12/30/2015 for Cough (been on doxy for 3 weeks) and Allergies   HPI Cough and nasal congestion and sinus congestion Patient has been having cough and nasal congestion and sinus congestion that's been going on for the past 4 weeks. He has been taking doxycycline for the past 3 weeks. He is also been using Claritin for a couple weeks. Just last night he started using Flonase and Mucinex. He says that his symptoms have improved but are not getting better with all of these. He did say that he had a lot of allergies when he was younger and actually had do allergy shots. He denies any fevers or chills or shortness of breath or wheezing.  Relevant past medical, surgical, family and social history reviewed and updated as indicated. Interim medical history since our last visit reviewed. Allergies and medications reviewed and updated.  Review of Systems  Constitutional: Negative for chills and fever.  HENT: Positive for congestion, postnasal drip, rhinorrhea, sinus pressure, sneezing and sore throat. Negative for ear discharge, ear pain and voice change.   Eyes: Negative for pain, discharge, redness and visual disturbance.  Respiratory: Positive for cough. Negative for shortness of breath and wheezing.   Cardiovascular: Negative for chest pain and leg swelling.  Musculoskeletal: Negative for gait problem.  Skin: Negative for rash.  All other systems reviewed and are negative.   Per HPI unless specifically indicated above      Objective:    BP (!) 164/107   Pulse 98   Temp 97.8 F (36.6 C)   Ht 5\' 10"  (1.778 m)   Wt 203 lb (92.1 kg)   BMI 29.13 kg/m   Wt Readings from Last 3 Encounters:  12/30/15 203 lb (92.1 kg)    12/06/15 203 lb (92.1 kg)  09/22/15 211 lb (95.7 kg)    Physical Exam  Constitutional: He is oriented to person, place, and time. He appears well-developed and well-nourished. No distress.  HENT:  Right Ear: Tympanic membrane, external ear and ear canal normal.  Left Ear: Tympanic membrane, external ear and ear canal normal.  Nose: Mucosal edema and rhinorrhea present. No sinus tenderness. No epistaxis. Right sinus exhibits maxillary sinus tenderness. Right sinus exhibits no frontal sinus tenderness. Left sinus exhibits maxillary sinus tenderness. Left sinus exhibits no frontal sinus tenderness.  Mouth/Throat: Uvula is midline and mucous membranes are normal. Posterior oropharyngeal edema and posterior oropharyngeal erythema present. No oropharyngeal exudate or tonsillar abscesses.  Eyes: Conjunctivae and EOM are normal. Pupils are equal, round, and reactive to light. Right eye exhibits no discharge. Left eye exhibits no discharge. No scleral icterus.  Neck: Neck supple. No thyromegaly present.  Cardiovascular: Normal rate, regular rhythm, normal heart sounds and intact distal pulses.   No murmur heard. Pulmonary/Chest: Effort normal and breath sounds normal. No respiratory distress. He has no wheezes. He has no rales.  Musculoskeletal: Normal range of motion. He exhibits no edema.  Lymphadenopathy:    He has no cervical adenopathy.  Neurological: He is alert and oriented to person, place, and time. Coordination normal.  Skin: Skin is warm and dry. No rash  noted. He is not diaphoretic.  Psychiatric: He has a normal mood and affect. His behavior is normal.  Nursing note and vitals reviewed.     Assessment & Plan:   Problem List Items Addressed This Visit    None    Visit Diagnoses    Acute seasonal allergic rhinitis, unspecified trigger    -  Primary   Patient is a party on Mucinex and Flonase and Claritin and been on doxycycline for 3 weeks, will give Depo-Medrol, watch your sugars  carefully   Relevant Medications   methylPREDNISolone acetate (DEPO-MEDROL) injection 80 mg (Start on 12/30/2015 11:15 AM)       Follow up plan: Return if symptoms worsen or fail to improve.  Counseling provided for all of the vaccine components No orders of the defined types were placed in this encounter.   Caryl Pina, MD [facility] 12/30/2015, 11:01 AM

## 2015-12-30 NOTE — Patient Instructions (Signed)
Patient is a party on Mucinex and Flonase and Claritin and been on doxycycline for 3 weeks, will give Depo-Medrol, watch your sugars carefully

## 2016-01-15 ENCOUNTER — Other Ambulatory Visit: Payer: Self-pay | Admitting: Family Medicine

## 2016-01-18 DIAGNOSIS — N183 Chronic kidney disease, stage 3 (moderate): Secondary | ICD-10-CM | POA: Diagnosis not present

## 2016-01-29 ENCOUNTER — Other Ambulatory Visit: Payer: Self-pay | Admitting: Family Medicine

## 2016-01-30 ENCOUNTER — Other Ambulatory Visit: Payer: Self-pay | Admitting: Family Medicine

## 2016-01-31 NOTE — Telephone Encounter (Signed)
Patients last office visit was 12-06-15. Rx last filled on 11-26 for 75g. Please advise. If approved rx will print. Please route to pool A so nurse can call patient to pick up

## 2016-02-07 DIAGNOSIS — E103593 Type 1 diabetes mellitus with proliferative diabetic retinopathy without macular edema, bilateral: Secondary | ICD-10-CM | POA: Diagnosis not present

## 2016-02-09 ENCOUNTER — Ambulatory Visit: Payer: BLUE CROSS/BLUE SHIELD | Admitting: Pulmonary Disease

## 2016-03-19 ENCOUNTER — Ambulatory Visit (INDEPENDENT_AMBULATORY_CARE_PROVIDER_SITE_OTHER): Payer: Medicare Other | Admitting: Pulmonary Disease

## 2016-03-19 ENCOUNTER — Encounter: Payer: Self-pay | Admitting: Pulmonary Disease

## 2016-03-19 VITALS — BP 138/70 | HR 79 | Ht 70.0 in | Wt 202.8 lb

## 2016-03-19 DIAGNOSIS — G4733 Obstructive sleep apnea (adult) (pediatric): Secondary | ICD-10-CM

## 2016-03-19 DIAGNOSIS — Z9989 Dependence on other enabling machines and devices: Secondary | ICD-10-CM | POA: Diagnosis not present

## 2016-03-19 NOTE — Progress Notes (Signed)
Current Outpatient Prescriptions on File Prior to Visit  Medication Sig  . abacavir-lamiVUDine (EPZICOM) 600-300 MG tablet Take 1 tablet by mouth daily.  Marland Kitchen acetaminophen (TYLENOL) 500 MG tablet Take 1,000 mg by mouth 2 (two) times daily.   Marland Kitchen acyclovir (ZOVIRAX) 400 MG tablet TAKE 1 TABLET (400 MG TOTAL) BY MOUTH 2 (TWO) TIMES DAILY.  Marland Kitchen ANDROGEL PUMP 20.25 MG/ACT (1.62%) GEL APPLY 2 PUMPS AS DIRECTED ONCE A DAY  . aspirin 81 MG chewable tablet Chew 81 mg by mouth every morning.  . colchicine 0.6 MG tablet Take 0.6 mg by mouth as needed.  . dolutegravir (TIVICAY) 50 MG tablet Take 1 tablet (50 mg total) by mouth daily.  . DULoxetine (CYMBALTA) 60 MG capsule TAKE 1 CAPSULE (60 MG TOTAL) BY MOUTH 2 (TWO) TIMES DAILY. AS DIRECTED  . esomeprazole (NEXIUM) 40 MG capsule TAKE ONE CAPSULE BY MOUTH DAILY  . ferrous sulfate 325 (65 FE) MG tablet Take 650 mg by mouth daily with breakfast.   . fludrocortisone (FLORINEF) 0.1 MG tablet Take 0.1 mg by mouth daily.   . fluticasone (FLONASE) 50 MCG/ACT nasal spray Place 2 sprays into both nostrils daily.  . furosemide (LASIX) 40 MG tablet Take 20 mg by mouth 2 (two) times daily.  . Glucosamine-Chondroit-Vit C-Mn (GLUCOSAMINE 1500 COMPLEX PO) Take 1 tablet by mouth 2 (two) times daily.   Marland Kitchen glucose blood test strip Test BS QID and PRN.  Marland Kitchen HUMALOG 100 UNIT/ML injection USE PER PUMP AS DIRECTED  . Lancets MISC Test BS QID and PRN. E10.65  . levothyroxine (SYNTHROID, LEVOTHROID) 175 MCG tablet Take 1 tablet (175 mcg total) by mouth daily before breakfast.  . loratadine (CLARITIN) 10 MG tablet Take 10 mg by mouth daily as needed for allergies.  Marland Kitchen LORazepam (ATIVAN) 0.5 MG tablet TAKE 1 TABLET BY MOUTH EVERY DAY AS NEEDED  . metoCLOPramide (REGLAN) 5 MG tablet TAKE 1 TABLET BY MOUTH 3 TIMES A DAY BEFORE MEALS  . niacin (NIASPAN) 1000 MG CR tablet TAKE 1 TABLET (1,000 MG TOTAL) BY MOUTH AT BEDTIME.  Marland Kitchen omega-3 acid ethyl esters (LOVAZA) 1 g capsule TAKE 2 CAPSULES (2 G  TOTAL) BY MOUTH 2 (TWO) TIMES DAILY.  Marland Kitchen ondansetron (ZOFRAN) 4 MG tablet Take 1 tablet (4 mg total) by mouth every 8 (eight) hours as needed for nausea.  Marland Kitchen OVER THE COUNTER MEDICATION Take 1 capsule by mouth daily. [name]- probiotic daily  . simvastatin (ZOCOR) 40 MG tablet TAKE 1 TABLET (40 MG TOTAL) BY MOUTH AT BEDTIME.  . traMADol (ULTRAM) 50 MG tablet TAKE 1 TABLET BY MOUTH EVERY 6 HOURS AS NEEDED FOR PAIN  . ULORIC 40 MG tablet TAKE 1 TABLET BY MOUTH EVERY DAY   No current facility-administered medications on file prior to visit.     Chief Complaint  Patient presents with  . Follow-up    Wears CPAP most nights. Denies problems with mask/pressure.  DME: [facility]    Sleep tests PSG 09/26/14 >> AHI 11.3, SpO2 low 86%, PLMI 51.9 Auto CPAP 02/18/16 to 03/18/16 >> used on 23 of 30 nights with average 5 hrs 25 min.  Average AHI 5.3 with median CPAP 8 and 95 th percentile CPAP 11 cm H2O  Past medical history DM with retinopathy, HLD, Anemia, Hypothyroidism, CKD, Depression, Gastroparesis, GERD, HIV  Past surgical history, Family history, Social history, Allergies reviewed  Vital signs BP 138/70 (BP Location: Left Arm, Cuff Size: Normal)   Pulse 79   Ht 5\' 10"  (1.778 m)  Wt 202 lb 12.8 oz (92 kg)   SpO2 99%   BMI 29.10 kg/m    History of Present Illness: Patrick Brown is a 46 y.o. male with OSA.  He is doing well with CPAP.  No issue with mask fit.  He goes to bathroom in middle of night, and doesn't always put mask back on.  He is not using mask when he naps.   Physical Exam: BP 138/70 (BP Location: Left Arm, Cuff Size: Normal)   Pulse 79   Ht 5\' 10"  (1.778 m)   Wt 202 lb 12.8 oz (92 kg)   SpO2 99%   BMI 29.10 kg/m   General - pleasant ENT - no sinus tenderness, no oral exudate, MP 3, 1+ tonsils Cardiac - regular, no murmur Chest - no wheeze, rales Back - no tenderness Abd - soft, non tender Ext - no edema Neuro - normal strength Skin - no rashes Psych - normal  mood   Assessment/Plan:  Obstructive sleep apnea. - he is compliant with therapy and reports benefit - continue auto CPAP - advised him to use CPAP whenever he is asleep  Patient Instructions  Follow up in 1 year   Chesley Mires, MD [facility] Pager:  [phone] 03/19/2016, 12:39 PM

## 2016-03-19 NOTE — Patient Instructions (Signed)
Follow up in 1 year.

## 2016-03-20 DIAGNOSIS — L03115 Cellulitis of right lower limb: Secondary | ICD-10-CM | POA: Diagnosis not present

## 2016-03-20 DIAGNOSIS — L738 Other specified follicular disorders: Secondary | ICD-10-CM | POA: Diagnosis not present

## 2016-03-26 ENCOUNTER — Ambulatory Visit (INDEPENDENT_AMBULATORY_CARE_PROVIDER_SITE_OTHER): Payer: Medicare Other | Admitting: Internal Medicine

## 2016-03-26 ENCOUNTER — Encounter: Payer: Self-pay | Admitting: Internal Medicine

## 2016-03-26 ENCOUNTER — Other Ambulatory Visit (HOSPITAL_COMMUNITY)
Admission: RE | Admit: 2016-03-26 | Discharge: 2016-03-26 | Disposition: A | Discharge status: Home / Self Care | Payer: Medicare Other | Source: Ambulatory Visit | Attending: Internal Medicine | Admitting: Internal Medicine

## 2016-03-26 VITALS — BP 125/86 | HR 101 | Temp 98.1°F | Ht 70.0 in | Wt 202.0 lb

## 2016-03-26 DIAGNOSIS — Z113 Encounter for screening for infections with a predominantly sexual mode of transmission: Secondary | ICD-10-CM

## 2016-03-26 DIAGNOSIS — N183 Chronic kidney disease, stage 3 unspecified: Secondary | ICD-10-CM

## 2016-03-26 DIAGNOSIS — B20 Human immunodeficiency virus [HIV] disease: Secondary | ICD-10-CM | POA: Diagnosis not present

## 2016-03-26 DIAGNOSIS — Z23 Encounter for immunization: Secondary | ICD-10-CM | POA: Diagnosis not present

## 2016-03-27 LAB — T-HELPER CELL (CD4) - (RCID CLINIC ONLY)
CD4 T CELL HELPER: 11 % — AB (ref 33–55)
CD4 T Cell Abs: 310 /uL — ABNORMAL LOW (ref 400–2700)

## 2016-03-27 LAB — RPR

## 2016-03-27 LAB — URINE CYTOLOGY ANCILLARY ONLY
CHLAMYDIA, DNA PROBE: NEGATIVE
NEISSERIA GONORRHEA: NEGATIVE

## 2016-03-27 NOTE — Progress Notes (Signed)
CC: Follow up for HIV  Interval history: Currently is asymptomatic and well-controlled on Tivicay and Epzicom.  His insurance copay for triumeq was high so he remains on the two pills.  Since last visit he has had no new issues.  Has no associated n/v/d.  Denies any missed doses.    Not sexually active. Also has DM and is controlled.    Prior to Admission medications   Medication Sig Start Date End Date Taking? Authorizing Provider  abacavir-lamiVUDine (EPZICOM) 600-300 MG tablet Take 1 tablet by mouth daily. 10/03/15  Yes Thayer Headings, MD  acetaminophen (TYLENOL) 500 MG tablet Take 1,000 mg by mouth 2 (two) times daily.    Yes Historical Provider, MD  acyclovir (ZOVIRAX) 400 MG tablet TAKE 1 TABLET (400 MG TOTAL) BY MOUTH 2 (TWO) TIMES DAILY. 09/02/14  Yes Historical Provider, MD  ANDROGEL PUMP 20.25 MG/ACT (1.62%) GEL APPLY 2 PUMPS AS DIRECTED ONCE A DAY 01/31/16  Yes Chipper Herb, MD  aspirin 81 MG chewable tablet Chew 81 mg by mouth every morning.   Yes Historical Provider, MD  colchicine 0.6 MG tablet Take 0.6 mg by mouth as needed. 10/12/13  Yes Chipper Herb, MD  dolutegravir (TIVICAY) 50 MG tablet Take 1 tablet (50 mg total) by mouth daily. 10/03/15  Yes Thayer Headings, MD  DULoxetine (CYMBALTA) 60 MG capsule TAKE 1 CAPSULE (60 MG TOTAL) BY MOUTH 2 (TWO) TIMES DAILY. AS DIRECTED 12/08/15  Yes Chipper Herb, MD  esomeprazole (NEXIUM) 40 MG capsule TAKE ONE CAPSULE BY MOUTH DAILY 01/15/16  Yes Chipper Herb, MD  ferrous sulfate 325 (65 FE) MG tablet Take 650 mg by mouth daily with breakfast.    Yes Historical Provider, MD  fludrocortisone (FLORINEF) 0.1 MG tablet Take 0.1 mg by mouth daily.    Yes Historical Provider, MD  fluticasone (FLONASE) 50 MCG/ACT nasal spray Place 2 sprays into both nostrils daily. 11/15/14  Yes Chipper Herb, MD  furosemide (LASIX) 40 MG tablet Take 20 mg by mouth 2 (two) times daily. 11/07/14  Yes Historical Provider, MD  Glucosamine-Chondroit-Vit C-Mn  (GLUCOSAMINE 1500 COMPLEX PO) Take 1 tablet by mouth 2 (two) times daily.    Yes Historical Provider, MD  glucose blood test strip Test BS QID and PRN. 12/06/15  Yes Chipper Herb, MD  HUMALOG 100 UNIT/ML injection USE PER PUMP AS DIRECTED 01/31/16  Yes Chipper Herb, MD  Lancets MISC Test BS QID and PRN. E10.65 12/06/15  Yes Chipper Herb, MD  levothyroxine (SYNTHROID, LEVOTHROID) 175 MCG tablet Take 1 tablet (175 mcg total) by mouth daily before breakfast. 04/03/15  Yes Chipper Herb, MD  loratadine (CLARITIN) 10 MG tablet Take 10 mg by mouth daily as needed for allergies.   Yes Historical Provider, MD  LORazepam (ATIVAN) 0.5 MG tablet TAKE 1 TABLET BY MOUTH EVERY DAY AS NEEDED 10/12/13  Yes Chipper Herb, MD  metoCLOPramide (REGLAN) 5 MG tablet TAKE 1 TABLET BY MOUTH 3 TIMES A DAY BEFORE MEALS 10/02/15  Yes Chipper Herb, MD  niacin (NIASPAN) 1000 MG CR tablet TAKE 1 TABLET (1,000 MG TOTAL) BY MOUTH AT BEDTIME. 05/22/15  Yes Chipper Herb, MD  omega-3 acid ethyl esters (LOVAZA) 1 g capsule TAKE 2 CAPSULES (2 G TOTAL) BY MOUTH 2 (TWO) TIMES DAILY. 08/02/15  Yes Chipper Herb, MD  ondansetron (ZOFRAN) 4 MG tablet Take 1 tablet (4 mg total) by mouth every 8 (eight) hours as needed for nausea.  12/06/15  Yes Chipper Herb, MD  OVER THE COUNTER MEDICATION Take 1 capsule by mouth daily. [name]- probiotic daily   Yes Historical Provider, MD  simvastatin (ZOCOR) 40 MG tablet TAKE 1 TABLET (40 MG TOTAL) BY MOUTH AT BEDTIME. 01/29/16  Yes Chipper Herb, MD  traMADol (ULTRAM) 50 MG tablet TAKE 1 TABLET BY MOUTH EVERY 6 HOURS AS NEEDED FOR PAIN 03/28/14  Yes Chipper Herb, MD  ULORIC 40 MG tablet TAKE 1 TABLET BY MOUTH EVERY DAY 01/15/16  Yes Chipper Herb, MD    Review of Systems Constitutional: negative for fatigue and malaise Genitourinary: negative for penile discharge Integument/breast: negative for rash All other systems reviewed and are negative    Physical Exam: CONSTITUTIONAL:in no  apparent distress  Vitals:   03/26/16 0947  BP: 125/86  Pulse: (!) 101  Temp: 98.1 F (36.7 C)   Eyes: anicteric HENT: no thrush, no cervical lymphadenopathy Respiratory: Normal respiratory effort; CTA B  Lab Results  Component Value Date   HIV1RNAQUANT <20 09/18/2015   HIV1RNAQUANT <20 02/09/2015   HIV1RNAQUANT <20 09/05/2014   No components found for: HIV1GENOTYPRPLUS No components found for: THELPERCELL

## 2016-03-27 NOTE — Assessment & Plan Note (Signed)
Labs today and rtc 6 months.

## 2016-03-27 NOTE — Assessment & Plan Note (Signed)
His creat has remained stable, no dose adjustment indicated.

## 2016-03-29 LAB — HIV-1 RNA QUANT-NO REFLEX-BLD
HIV 1 RNA Quant: <20 copies/mL
HIV-1 RNA Quant, Log: <1.3 Log copies/mL

## 2016-04-02 DIAGNOSIS — R0902 Hypoxemia: Secondary | ICD-10-CM | POA: Diagnosis not present

## 2016-04-02 DIAGNOSIS — J9690 Respiratory failure, unspecified, unspecified whether with hypoxia or hypercapnia: Secondary | ICD-10-CM | POA: Diagnosis not present

## 2016-04-02 DIAGNOSIS — G4733 Obstructive sleep apnea (adult) (pediatric): Secondary | ICD-10-CM | POA: Diagnosis not present

## 2016-04-16 ENCOUNTER — Ambulatory Visit: Payer: Medicare Other | Admitting: Family Medicine

## 2016-04-18 ENCOUNTER — Encounter: Payer: Self-pay | Admitting: Family Medicine

## 2016-04-18 ENCOUNTER — Other Ambulatory Visit: Payer: Self-pay

## 2016-04-18 ENCOUNTER — Ambulatory Visit (INDEPENDENT_AMBULATORY_CARE_PROVIDER_SITE_OTHER): Payer: Medicare Other | Admitting: Family Medicine

## 2016-04-18 VITALS — BP 132/86 | HR 78 | Temp 96.9°F | Ht 70.0 in | Wt 206.0 lb

## 2016-04-18 DIAGNOSIS — E1065 Type 1 diabetes mellitus with hyperglycemia: Secondary | ICD-10-CM

## 2016-04-18 DIAGNOSIS — Z0000 Encounter for general adult medical examination without abnormal findings: Secondary | ICD-10-CM

## 2016-04-18 DIAGNOSIS — E7800 Pure hypercholesterolemia, unspecified: Secondary | ICD-10-CM

## 2016-04-18 DIAGNOSIS — E349 Endocrine disorder, unspecified: Secondary | ICD-10-CM

## 2016-04-18 DIAGNOSIS — E559 Vitamin D deficiency, unspecified: Secondary | ICD-10-CM | POA: Insufficient documentation

## 2016-04-18 DIAGNOSIS — B20 Human immunodeficiency virus [HIV] disease: Secondary | ICD-10-CM

## 2016-04-18 DIAGNOSIS — N183 Chronic kidney disease, stage 3 unspecified: Secondary | ICD-10-CM

## 2016-04-18 DIAGNOSIS — D509 Iron deficiency anemia, unspecified: Secondary | ICD-10-CM

## 2016-04-18 DIAGNOSIS — E1022 Type 1 diabetes mellitus with diabetic chronic kidney disease: Secondary | ICD-10-CM

## 2016-04-18 DIAGNOSIS — N400 Enlarged prostate without lower urinary tract symptoms: Secondary | ICD-10-CM | POA: Insufficient documentation

## 2016-04-18 DIAGNOSIS — IMO0002 Reserved for concepts with insufficient information to code with codable children: Secondary | ICD-10-CM

## 2016-04-18 DIAGNOSIS — R5382 Chronic fatigue, unspecified: Secondary | ICD-10-CM

## 2016-04-18 NOTE — Patient Instructions (Addendum)
Medicare Annual Wellness Visit  [facility] and the medical providers at [facility] strive to bring you the best medical care.  In doing so we not only want to address your current medical conditions and concerns but also to detect new conditions early and prevent illness, disease and health-related problems.    Medicare offers a yearly Wellness Visit which allows our clinical staff to assess your need for preventative services including immunizations, lifestyle education, counseling to decrease risk of preventable diseases and screening for fall risk and other medical concerns.    This visit is provided free of charge (no copay) for all Medicare recipients. The clinical pharmacists at [facility] have begun to conduct these Wellness Visits which will also include a thorough review of all your medications.    As you primary medical provider recommend that you make an appointment for your Annual Wellness Visit if you have not done so already this year.  You may set up this appointment before you leave today or you may call back ([phone]) and schedule an appointment.  Please make sure when you call that you mention that you are scheduling your Annual Wellness Visit with the clinical pharmacist so that the appointment may be made for the proper length of time.     Continue current medications. Continue good therapeutic lifestyle changes which include good diet and exercise. Fall precautions discussed with patient. If an FOBT was given today- please return it to our front desk. If you are over 73 years old - you may need Prevnar 46 or the adult Pneumonia vaccine.  **Flu shots are available--- please call and schedule a FLU-CLINIC appointment**  After your visit with [place] today you will receive a survey in the mail or online from [employer] regarding your care with [place]. Please take a moment to fill this out. Your feedback is very  important to [place] as you can help [place] better understand your patient needs as well as improve your experience and satisfaction. WE CARE ABOUT YOU!!!  Continue to follow-up with nephrology Continue to follow-up with infectious disease Continue to monitor blood pressures regularly and blood sugars regularly We will call with lab work results as soon as these results become available

## 2016-04-18 NOTE — Progress Notes (Signed)
Subjective:    Patient ID: Patrick Brown, male    DOB: [birth year], 46 y.o.   MRN: [id number]  HPI Patient is here today for annual wellness exam and follow up of chronic medical problems which includes hyperlipidemia and diabetes. He is taking medication regularly.This patient is followed by infectious disease and also by nephrology group in [place]. He has no specific complaints today and is requesting the refills. The patient denies any chest pain or shortness of breath. He denies any cardiac chest pain he has had some chest wall soreness but no cardiac chest pain. He did see the cardiologist this past year and had a stress test which was normal. The patient denies any problems with his stomach including nausea vomiting diarrhea blood in the stool or black tarry bowel movements. He does indicate that in the past couple of years that he [place]. He denies any trouble with passing his water. His currently still using AndroGel. He indicates that he is still feeling very tired and is not sure if his testosterone levels are high enough or if they're playing a role with this. He indicates that his blood sugars fasting using running around 100-120 and during the day may be as high as 140. He has not checked any blood pressures at home and the initial blood pressure today was 133/92. We saw the nephrologist the last time it was much better. His weight is up about 4 pounds compared to what it was when we saw him the last time. His body mass index is 29. Family history wise his mother father and brother all stable and in good health.    Patient Active Problem List   Diagnosis Date Noted  . OSA (obstructive sleep apnea) 10/06/2014  . Screening examination for venereal disease 12/23/2013  . Pain in joint, lower leg 11/19/2012  . Unspecified sinusitis (chronic) 09/16/2012  . Right groin wound 08/09/2012  . HIV disease ([place]) 06/02/2012  . Convulsions/seizures ([place]) 05/16/2012  . Pulmonary infiltrate  04/18/2012  . Gastroparesis 04/18/2012  . Anemia, iron deficiency   . DM (diabetes mellitus), type 1, uncontrolled ([place]) 05/08/2011  . Hypothyroid 05/08/2011  . Chronic kidney disease, stage 3, mod decreased GFR 12/31/2010   Outpatient Encounter Prescriptions as of 04/18/2016  Medication Sig  . abacavir-lamiVUDine (EPZICOM) 600-300 MG tablet Take 1 tablet by mouth daily.  Marland Kitchen acetaminophen (TYLENOL) 500 MG tablet Take 1,000 mg by mouth 2 (two) times daily.   Marland Kitchen acyclovir (ZOVIRAX) 400 MG tablet TAKE 1 TABLET (400 MG TOTAL) BY MOUTH 2 (TWO) TIMES DAILY.  Marland Kitchen ANDROGEL PUMP 20.25 MG/ACT (1.62%) GEL APPLY 2 PUMPS AS DIRECTED ONCE A DAY  . aspirin 81 MG chewable tablet Chew 81 mg by mouth every morning.  . colchicine 0.6 MG tablet Take 0.6 mg by mouth as needed.  . dolutegravir (TIVICAY) 50 MG tablet Take 1 tablet (50 mg total) by mouth daily.  . DULoxetine (CYMBALTA) 60 MG capsule TAKE 1 CAPSULE (60 MG TOTAL) BY MOUTH 2 (TWO) TIMES DAILY. AS DIRECTED  . esomeprazole (NEXIUM) 40 MG capsule TAKE ONE CAPSULE BY MOUTH DAILY  . ferrous sulfate 325 (65 FE) MG tablet Take 650 mg by mouth daily with breakfast.   . fludrocortisone (FLORINEF) 0.1 MG tablet Take 0.1 mg by mouth daily.   . fluticasone (FLONASE) 50 MCG/ACT nasal spray Place 2 sprays into both nostrils daily.  . furosemide (LASIX) 40 MG tablet Take 20 mg by mouth 2 (two) times daily.  . Glucosamine-Chondroit-Vit C-Mn (GLUCOSAMINE 1500  COMPLEX PO) Take 1 tablet by mouth 2 (two) times daily.   Marland Kitchen glucose blood test strip Test BS QID and PRN.  Marland Kitchen HUMALOG 100 UNIT/ML injection USE PER PUMP AS DIRECTED  . Lancets MISC Test BS QID and PRN. E10.65  . levothyroxine (SYNTHROID, LEVOTHROID) 175 MCG tablet Take 1 tablet (175 mcg total) by mouth daily before breakfast.  . loratadine (CLARITIN) 10 MG tablet Take 10 mg by mouth daily as needed for allergies.  Marland Kitchen LORazepam (ATIVAN) 0.5 MG tablet TAKE 1 TABLET BY MOUTH EVERY DAY AS NEEDED  . metoCLOPramide  (REGLAN) 5 MG tablet TAKE 1 TABLET BY MOUTH 3 TIMES A DAY BEFORE MEALS  . niacin (NIASPAN) 1000 MG CR tablet TAKE 1 TABLET (1,000 MG TOTAL) BY MOUTH AT BEDTIME.  Marland Kitchen omega-3 acid ethyl esters (LOVAZA) 1 g capsule TAKE 2 CAPSULES (2 G TOTAL) BY MOUTH 2 (TWO) TIMES DAILY.  Marland Kitchen ondansetron (ZOFRAN) 4 MG tablet Take 1 tablet (4 mg total) by mouth every 8 (eight) hours as needed for nausea.  Marland Kitchen OVER THE COUNTER MEDICATION Take 1 capsule by mouth daily. [name]- probiotic daily  . simvastatin (ZOCOR) 40 MG tablet TAKE 1 TABLET (40 MG TOTAL) BY MOUTH AT BEDTIME.  . traMADol (ULTRAM) 50 MG tablet TAKE 1 TABLET BY MOUTH EVERY 6 HOURS AS NEEDED FOR PAIN  . ULORIC 40 MG tablet TAKE 1 TABLET BY MOUTH EVERY DAY   No facility-administered encounter medications on file as of 04/18/2016.       Review of Systems  Constitutional: Negative.   HENT: Negative.   Eyes: Negative.   Respiratory: Negative.   Cardiovascular: Negative.   Gastrointestinal: Negative.   Endocrine: Negative.   Genitourinary: Negative.   Musculoskeletal: Negative.   Skin: Negative.   Allergic/Immunologic: Negative.   Neurological: Negative.   Hematological: Negative.   Psychiatric/Behavioral: Negative.        Objective:   Physical Exam  Constitutional: He is oriented to person, place, and time. He appears well-developed and well-nourished. No distress.  The patient is pleasant and doing well he just complains of feeling tired a lot.  HENT:  Head: Normocephalic and atraumatic.  Right Ear: External ear normal.  Left Ear: External ear normal.  Nose: Nose normal.  Mouth/Throat: Oropharynx is clear and moist. No oropharyngeal exudate.  Eyes: Conjunctivae and EOM are normal. Pupils are equal, round, and reactive to light. Right eye exhibits no discharge. Left eye exhibits no discharge. No scleral icterus.  He will continue to follow-up with the [facility].  Neck: Normal range of motion. Neck supple. No thyromegaly present.  No  bruits thyromegaly or anterior cervical adenopathy  Cardiovascular: Normal rate, regular rhythm, normal heart sounds and intact distal pulses.   No murmur heard. Heart has a regular rate and rhythm at 72/m  Pulmonary/Chest: Effort normal and breath sounds normal. No respiratory distress. He has no wheezes. He has no rales. He exhibits no tenderness.  Clear anteriorly and posteriorly and no axillary adenopathy  Abdominal: Soft. Bowel sounds are normal. He exhibits no mass. There is no tenderness. There is no rebound and no guarding.  No abdominal tenderness masses or organ enlargement or bruits or inguinal adenopathy  Genitourinary: Rectum normal and penis normal.  Genitourinary Comments: The prostate is slightly enlarged but smooth. There is no rectal masses. The external genitalia were normal and there were no inguinal hernias palpable.  Musculoskeletal: Normal range of motion. He exhibits no edema.  Lymphadenopathy:    He has no  cervical adenopathy.  Neurological: He is alert and oriented to person, place, and time. He has normal reflexes. No cranial nerve deficit.  Skin: Skin is warm and dry. No rash noted.  Psychiatric: He has a normal mood and affect. His behavior is normal. Judgment and thought content normal.  Nursing note and vitals reviewed.  BP (!) 133/92 (BP Location: Right Arm)   Pulse 78   Temp (!) 96.9 F (36.1 C) (Oral)   Ht '5\' 10"'$  (1.778 m)   Wt 206 lb (93.4 kg)   BMI 29.56 kg/m        Assessment & Plan:  1. Annual physical exam -The patient will need a colonoscopy at age 25 - CBC with Differential/Platelet; Future - BMP8+EGFR; Future - Hepatic function panel; Future - PSA, total and free; Future - Lipid panel; Future - Urinalysis, Complete; Future - VITAMIN D 25 Hydroxy (Vit-D Deficiency, Fractures); Future - Bayer DCA Hb A1c Waived; Future  2. Uncontrolled type 1 diabetes mellitus with stage 3 chronic kidney disease ([place]) -He will continue with his insulin  pump as he is doing and monitor his blood sugars closely and try to get blood sugars during the day as well as fasting - CBC with Differential/Platelet; Future - BMP8+EGFR; Future - Bayer DCA Hb A1c Waived; Future  3. Pure hypercholesterolemia -Continue with aggressive therapeutic lifestyle changes and current statin. - CBC with Differential/Platelet; Future - BMP8+EGFR; Future - Hepatic function panel; Future - Lipid panel; Future  4. Vitamin D deficiency -Continue with vitamin D replacement pending results of lab work - CBC with Differential/Platelet; Future - VITAMIN D 25 Hydroxy (Vit-D Deficiency, Fractures); Future  5. Chronic kidney disease, stage 3, mod decreased GFR -Follow-up with nephrology as planned and Dr. Antionette Fairy. - CBC with Differential/Platelet; Future - BMP8+EGFR; Future  6. Iron deficiency anemia, unspecified iron deficiency anemia type -Check CBC - CBC with Differential/Platelet; Future  7. Benign prostatic hyperplasia without lower urinary tract symptoms -No symptoms with prostate.  8. Testosterone deficiency -Continue with current testosterone treatment pending results of lab work  9. Fatigue -Check lab work and make appropriate recommendations -Get B12 level  10. HIV disease -Follow-up with infectious disease as planned  Patient Instructions                       Medicare Annual Wellness Visit  [facility] and the medical providers at [facility] strive to bring you the best medical care.  In doing so we not only want to address your current medical conditions and concerns but also to detect new conditions early and prevent illness, disease and health-related problems.    Medicare offers a yearly Wellness Visit which allows our clinical staff to assess your need for preventative services including immunizations, lifestyle education, counseling to decrease risk of preventable diseases and screening for fall risk and other  medical concerns.    This visit is provided free of charge (no copay) for all Medicare recipients. The clinical pharmacists at [facility] have begun to conduct these Wellness Visits which will also include a thorough review of all your medications.    As you primary medical provider recommend that you make an appointment for your Annual Wellness Visit if you have not done so already this year.  You may set up this appointment before you leave today or you may call back ([phone]) and schedule an appointment.  Please make sure when you call that you mention that you  are scheduling your Annual Wellness Visit with the clinical pharmacist so that the appointment may be made for the proper length of time.     Continue current medications. Continue good therapeutic lifestyle changes which include good diet and exercise. Fall precautions discussed with patient. If an FOBT was given today- please return it to our front desk. If you are over 61 years old - you may need Prevnar 18 or the adult Pneumonia vaccine.  **Flu shots are available--- please call and schedule a FLU-CLINIC appointment**  After your visit with [place] today you will receive a survey in the mail or online from [employer] regarding your care with [place]. Please take a moment to fill this out. Your feedback is very important to [place] as you can help [place] better understand your patient needs as well as improve your experience and satisfaction. WE CARE ABOUT YOU!!!  Continue to follow-up with nephrology Continue to follow-up with infectious disease Continue to monitor blood pressures regularly and blood sugars regularly We will call with lab work results as soon as these results become available   Arrie Senate MD

## 2016-04-18 NOTE — Addendum Note (Signed)
Addended by: Zannie Cove on: 04/18/2016 04:45 PM   Modules accepted: Orders

## 2016-04-19 LAB — MICROSCOPIC EXAMINATION: Casts: NONE SEEN /lpf

## 2016-04-19 LAB — URINALYSIS, COMPLETE
BILIRUBIN UA: NEGATIVE
GLUCOSE, UA: NEGATIVE
KETONES UA: NEGATIVE
Leukocytes, UA: NEGATIVE
Nitrite, UA: NEGATIVE
PROTEIN UA: NEGATIVE
RBC, UA: NEGATIVE
SPEC GRAV UA: 1.01 (ref 1.005–1.030)
Urobilinogen, Ur: 0.2 mg/dL (ref 0.2–1.0)
pH, UA: 5 (ref 5.0–7.5)

## 2016-04-22 ENCOUNTER — Telehealth: Payer: Self-pay | Admitting: Family Medicine

## 2016-04-22 NOTE — Telephone Encounter (Signed)
Okay with patient request to call in Augmentin

## 2016-04-22 NOTE — Telephone Encounter (Signed)
From a old prescription that Dr. Laurance Flatten had given him

## 2016-04-22 NOTE — Telephone Encounter (Signed)
Patient states that he has a sinus infection and a cough. He started the doxycycline and he is not feeling any better. Patient would like try Augmentin please

## 2016-04-22 NOTE — Telephone Encounter (Signed)
Where did he get doxycycline question I do not see from Dr. Tawanna Sat note that he was given it here.

## 2016-04-23 ENCOUNTER — Other Ambulatory Visit: Payer: Self-pay | Admitting: *Deleted

## 2016-04-23 DIAGNOSIS — B20 Human immunodeficiency virus [HIV] disease: Secondary | ICD-10-CM

## 2016-04-23 MED ORDER — ABACAVIR SULFATE-LAMIVUDINE 600-300 MG PO TABS: 1.0000 | ORAL_TABLET | Freq: Every day | ORAL | 6 refills | Status: DC

## 2016-04-23 MED ORDER — DOLUTEGRAVIR SODIUM 50 MG PO TABS: 50.0000 mg | ORAL_TABLET | Freq: Every day | ORAL | 6 refills | Status: DC

## 2016-04-23 MED ORDER — AMOXICILLIN-POT CLAVULANATE 875-125 MG PO TABS: 1.0000 | ORAL_TABLET | Freq: Two times a day (BID) | ORAL | 0 refills | Status: DC

## 2016-04-23 NOTE — Telephone Encounter (Signed)
Aware that Augmentin has been sent to pharmacy per Dr. Loreta Ave

## 2016-04-25 MED ORDER — DOLUTEGRAVIR SODIUM 50 MG PO TABS: 50.0000 mg | ORAL_TABLET | Freq: Every day | ORAL | 6 refills | Status: DC

## 2016-04-25 MED ORDER — ABACAVIR SULFATE-LAMIVUDINE 600-300 MG PO TABS: 1.0000 | ORAL_TABLET | Freq: Every day | ORAL | 6 refills | Status: DC

## 2016-04-25 NOTE — Addendum Note (Signed)
Addended by: Lorne Skeens D on: 04/25/2016 10:03 AM   Modules accepted: Orders

## 2016-05-06 ENCOUNTER — Other Ambulatory Visit: Payer: Medicare Other

## 2016-05-06 DIAGNOSIS — Z0000 Encounter for general adult medical examination without abnormal findings: Secondary | ICD-10-CM

## 2016-05-06 DIAGNOSIS — E1065 Type 1 diabetes mellitus with hyperglycemia: Secondary | ICD-10-CM | POA: Diagnosis not present

## 2016-05-06 DIAGNOSIS — E559 Vitamin D deficiency, unspecified: Secondary | ICD-10-CM

## 2016-05-06 DIAGNOSIS — R5382 Chronic fatigue, unspecified: Secondary | ICD-10-CM | POA: Diagnosis not present

## 2016-05-06 DIAGNOSIS — D509 Iron deficiency anemia, unspecified: Secondary | ICD-10-CM

## 2016-05-06 DIAGNOSIS — E349 Endocrine disorder, unspecified: Secondary | ICD-10-CM | POA: Diagnosis not present

## 2016-05-06 DIAGNOSIS — E7800 Pure hypercholesterolemia, unspecified: Secondary | ICD-10-CM

## 2016-05-06 DIAGNOSIS — N183 Chronic kidney disease, stage 3 unspecified: Secondary | ICD-10-CM

## 2016-05-06 DIAGNOSIS — IMO0002 Reserved for concepts with insufficient information to code with codable children: Secondary | ICD-10-CM

## 2016-05-06 DIAGNOSIS — E1022 Type 1 diabetes mellitus with diabetic chronic kidney disease: Secondary | ICD-10-CM | POA: Diagnosis not present

## 2016-05-06 LAB — BAYER DCA HB A1C WAIVED: HB A1C: 7.5 % — AB (ref <7.0)

## 2016-05-07 LAB — CBC WITH DIFFERENTIAL/PLATELET
BASOS ABS: 0.1 10*3/uL (ref 0.0–0.2)
Basos: 1 %
EOS (ABSOLUTE): 0.3 10*3/uL (ref 0.0–0.4)
Eos: 4 %
HEMOGLOBIN: 14.4 g/dL (ref 13.0–17.7)
Hematocrit: 42.4 % (ref 37.5–51.0)
IMMATURE GRANS (ABS): 0.1 10*3/uL (ref 0.0–0.1)
IMMATURE GRANULOCYTES: 1 %
LYMPHS: 38 %
Lymphocytes Absolute: 3.2 10*3/uL — ABNORMAL HIGH (ref 0.7–3.1)
MCH: 33.6 pg — AB (ref 26.6–33.0)
MCHC: 34 g/dL (ref 31.5–35.7)
MCV: 99 fL — ABNORMAL HIGH (ref 79–97)
Monocytes Absolute: 0.8 10*3/uL (ref 0.1–0.9)
Monocytes: 10 %
NEUTROS PCT: 46 %
Neutrophils Absolute: 4 10*3/uL (ref 1.4–7.0)
Platelets: 231 10*3/uL (ref 150–379)
RBC: 4.28 x10E6/uL (ref 4.14–5.80)
RDW: 13.9 % (ref 12.3–15.4)
WBC: 8.4 10*3/uL (ref 3.4–10.8)

## 2016-05-07 LAB — TESTOSTERONE,FREE AND TOTAL
TESTOSTERONE: 387 ng/dL (ref 264–916)
Testosterone, Free: 8.5 pg/mL (ref 6.8–21.5)

## 2016-05-07 LAB — BMP8+EGFR
BUN/Creatinine Ratio: 19 (ref 9–20)
BUN: 34 mg/dL — ABNORMAL HIGH (ref 6–24)
CO2: 22 mmol/L (ref 18–29)
CREATININE: 1.83 mg/dL — AB (ref 0.76–1.27)
Calcium: 9.6 mg/dL (ref 8.7–10.2)
Chloride: 99 mmol/L (ref 96–106)
GFR calc Af Amer: 50 mL/min/{1.73_m2} — ABNORMAL LOW (ref >59)
GFR calc non Af Amer: 43 mL/min/{1.73_m2} — ABNORMAL LOW (ref >59)
GLUCOSE: 179 mg/dL — AB (ref 65–99)
Potassium: 4.1 mmol/L (ref 3.5–5.2)
SODIUM: 140 mmol/L (ref 134–144)

## 2016-05-07 LAB — HEPATIC FUNCTION PANEL
ALBUMIN: 4.3 g/dL (ref 3.5–5.5)
ALK PHOS: 120 IU/L — AB (ref 39–117)
ALT: 44 IU/L (ref 0–44)
AST: 38 IU/L (ref 0–40)
Bilirubin Total: 0.3 mg/dL (ref 0.0–1.2)
Bilirubin, Direct: 0.12 mg/dL (ref 0.00–0.40)
TOTAL PROTEIN: 7.2 g/dL (ref 6.0–8.5)

## 2016-05-07 LAB — LIPID PANEL
Chol/HDL Ratio: 3.4 ratio (ref 0.0–5.0)
Cholesterol, Total: 133 mg/dL (ref 100–199)
HDL: 39 mg/dL — ABNORMAL LOW (ref >39)
LDL CALC: 62 mg/dL (ref 0–99)
Triglycerides: 162 mg/dL — ABNORMAL HIGH (ref 0–149)
VLDL Cholesterol Cal: 32 mg/dL (ref 5–40)

## 2016-05-07 LAB — VITAMIN D 25 HYDROXY (VIT D DEFICIENCY, FRACTURES): Vit D, 25-Hydroxy: 20.6 ng/mL — ABNORMAL LOW (ref 30.0–100.0)

## 2016-05-07 LAB — VITAMIN B12: Vitamin B-12: 471 pg/mL (ref 232–1245)

## 2016-05-07 LAB — PSA, TOTAL AND FREE
PROSTATE SPECIFIC AG, SERUM: 0.3 ng/mL (ref 0.0–4.0)
PSA FREE: 0.12 ng/mL
PSA, Free Pct: 40 %

## 2016-05-09 ENCOUNTER — Inpatient Hospital Stay (HOSPITAL_COMMUNITY)
Admission: EM | Admit: 2016-05-09 | Discharge: 2016-05-14 | DRG: 469 | Disposition: A | Discharge status: Home Health | Payer: Worker's Compensation | Attending: Internal Medicine | Admitting: Internal Medicine

## 2016-05-09 ENCOUNTER — Emergency Department (HOSPITAL_COMMUNITY): Payer: Worker's Compensation

## 2016-05-09 ENCOUNTER — Encounter (HOSPITAL_COMMUNITY): Payer: Self-pay

## 2016-05-09 DIAGNOSIS — K219 Gastro-esophageal reflux disease without esophagitis: Secondary | ICD-10-CM | POA: Diagnosis not present

## 2016-05-09 DIAGNOSIS — J9690 Respiratory failure, unspecified, unspecified whether with hypoxia or hypercapnia: Secondary | ICD-10-CM | POA: Diagnosis not present

## 2016-05-09 DIAGNOSIS — W19XXXD Unspecified fall, subsequent encounter: Secondary | ICD-10-CM | POA: Diagnosis not present

## 2016-05-09 DIAGNOSIS — R509 Fever, unspecified: Secondary | ICD-10-CM | POA: Diagnosis present

## 2016-05-09 DIAGNOSIS — W1789XA Other fall from one level to another, initial encounter: Secondary | ICD-10-CM | POA: Diagnosis present

## 2016-05-09 DIAGNOSIS — M1A9XX0 Chronic gout, unspecified, without tophus (tophi): Secondary | ICD-10-CM | POA: Diagnosis present

## 2016-05-09 DIAGNOSIS — R569 Unspecified convulsions: Secondary | ICD-10-CM

## 2016-05-09 DIAGNOSIS — B20 Human immunodeficiency virus [HIV] disease: Secondary | ICD-10-CM | POA: Diagnosis present

## 2016-05-09 DIAGNOSIS — F329 Major depressive disorder, single episode, unspecified: Secondary | ICD-10-CM | POA: Diagnosis present

## 2016-05-09 DIAGNOSIS — N183 Chronic kidney disease, stage 3 unspecified: Secondary | ICD-10-CM | POA: Diagnosis present

## 2016-05-09 DIAGNOSIS — S72001D Fracture of unspecified part of neck of right femur, subsequent encounter for closed fracture with routine healing: Secondary | ICD-10-CM | POA: Diagnosis not present

## 2016-05-09 DIAGNOSIS — E1043 Type 1 diabetes mellitus with diabetic autonomic (poly)neuropathy: Secondary | ICD-10-CM | POA: Diagnosis present

## 2016-05-09 DIAGNOSIS — Z7982 Long term (current) use of aspirin: Secondary | ICD-10-CM | POA: Diagnosis not present

## 2016-05-09 DIAGNOSIS — R6510 Systemic inflammatory response syndrome (SIRS) of non-infectious origin without acute organ dysfunction: Secondary | ICD-10-CM | POA: Diagnosis present

## 2016-05-09 DIAGNOSIS — Z888 Allergy status to other drugs, medicaments and biological substances status: Secondary | ICD-10-CM | POA: Diagnosis not present

## 2016-05-09 DIAGNOSIS — Z9841 Cataract extraction status, right eye: Secondary | ICD-10-CM

## 2016-05-09 DIAGNOSIS — E785 Hyperlipidemia, unspecified: Secondary | ICD-10-CM | POA: Diagnosis not present

## 2016-05-09 DIAGNOSIS — G4733 Obstructive sleep apnea (adult) (pediatric): Secondary | ICD-10-CM | POA: Diagnosis present

## 2016-05-09 DIAGNOSIS — R5082 Postprocedural fever: Secondary | ICD-10-CM | POA: Diagnosis present

## 2016-05-09 DIAGNOSIS — R0902 Hypoxemia: Secondary | ICD-10-CM | POA: Diagnosis not present

## 2016-05-09 DIAGNOSIS — E039 Hypothyroidism, unspecified: Secondary | ICD-10-CM | POA: Diagnosis present

## 2016-05-09 DIAGNOSIS — S72041A Displaced fracture of base of neck of right femur, initial encounter for closed fracture: Secondary | ICD-10-CM | POA: Diagnosis not present

## 2016-05-09 DIAGNOSIS — Z9889 Other specified postprocedural states: Secondary | ICD-10-CM

## 2016-05-09 DIAGNOSIS — E109 Type 1 diabetes mellitus without complications: Secondary | ICD-10-CM | POA: Diagnosis present

## 2016-05-09 DIAGNOSIS — S72031A Displaced midcervical fracture of right femur, initial encounter for closed fracture: Principal | ICD-10-CM | POA: Diagnosis present

## 2016-05-09 DIAGNOSIS — E10319 Type 1 diabetes mellitus with unspecified diabetic retinopathy without macular edema: Secondary | ICD-10-CM | POA: Diagnosis not present

## 2016-05-09 DIAGNOSIS — Z967 Presence of other bone and tendon implants: Secondary | ICD-10-CM | POA: Diagnosis not present

## 2016-05-09 DIAGNOSIS — Z96641 Presence of right artificial hip joint: Secondary | ICD-10-CM | POA: Diagnosis not present

## 2016-05-09 DIAGNOSIS — Z8781 Personal history of (healed) traumatic fracture: Secondary | ICD-10-CM | POA: Diagnosis not present

## 2016-05-09 DIAGNOSIS — Z9641 Presence of insulin pump (external) (internal): Secondary | ICD-10-CM | POA: Diagnosis not present

## 2016-05-09 DIAGNOSIS — K3184 Gastroparesis: Secondary | ICD-10-CM | POA: Diagnosis present

## 2016-05-09 DIAGNOSIS — Z882 Allergy status to sulfonamides status: Secondary | ICD-10-CM | POA: Diagnosis not present

## 2016-05-09 DIAGNOSIS — S72001A Fracture of unspecified part of neck of right femur, initial encounter for closed fracture: Secondary | ICD-10-CM | POA: Diagnosis not present

## 2016-05-09 DIAGNOSIS — E875 Hyperkalemia: Secondary | ICD-10-CM | POA: Diagnosis not present

## 2016-05-09 DIAGNOSIS — E1022 Type 1 diabetes mellitus with diabetic chronic kidney disease: Secondary | ICD-10-CM | POA: Diagnosis not present

## 2016-05-09 DIAGNOSIS — D509 Iron deficiency anemia, unspecified: Secondary | ICD-10-CM | POA: Diagnosis not present

## 2016-05-09 DIAGNOSIS — Z01811 Encounter for preprocedural respiratory examination: Secondary | ICD-10-CM

## 2016-05-09 DIAGNOSIS — Z833 Family history of diabetes mellitus: Secondary | ICD-10-CM | POA: Diagnosis not present

## 2016-05-09 DIAGNOSIS — Z471 Aftercare following joint replacement surgery: Secondary | ICD-10-CM | POA: Diagnosis not present

## 2016-05-09 DIAGNOSIS — Z79899 Other long term (current) drug therapy: Secondary | ICD-10-CM | POA: Diagnosis not present

## 2016-05-09 DIAGNOSIS — X58XXXD Exposure to other specified factors, subsequent encounter: Secondary | ICD-10-CM | POA: Diagnosis not present

## 2016-05-09 DIAGNOSIS — Z820 Family history of epilepsy and other diseases of the nervous system: Secondary | ICD-10-CM | POA: Diagnosis not present

## 2016-05-09 DIAGNOSIS — D62 Acute posthemorrhagic anemia: Secondary | ICD-10-CM | POA: Diagnosis not present

## 2016-05-09 DIAGNOSIS — Z21 Asymptomatic human immunodeficiency virus [HIV] infection status: Secondary | ICD-10-CM | POA: Diagnosis not present

## 2016-05-09 DIAGNOSIS — M25551 Pain in right hip: Secondary | ICD-10-CM | POA: Diagnosis not present

## 2016-05-09 LAB — CBC WITH DIFFERENTIAL/PLATELET
Basophils Absolute: 0.1 10*3/uL (ref 0.0–0.1)
Basophils Relative: 1 %
Eosinophils Absolute: 0.3 10*3/uL (ref 0.0–0.7)
Eosinophils Relative: 3 %
HCT: 39.2 % (ref 39.0–52.0)
HEMOGLOBIN: 14.1 g/dL (ref 13.0–17.0)
LYMPHS PCT: 23 %
Lymphs Abs: 2.4 10*3/uL (ref 0.7–4.0)
MCH: 33.3 pg (ref 26.0–34.0)
MCHC: 36 g/dL (ref 30.0–36.0)
MCV: 92.7 fL (ref 78.0–100.0)
MONO ABS: 0.8 10*3/uL (ref 0.1–1.0)
MONOS PCT: 8 %
Neutro Abs: 7 10*3/uL (ref 1.7–7.7)
Neutrophils Relative %: 65 %
Platelets: 271 10*3/uL (ref 150–400)
RBC: 4.23 MIL/uL (ref 4.22–5.81)
RDW: 12.7 % (ref 11.5–15.5)
WBC: 10.6 10*3/uL — ABNORMAL HIGH (ref 4.0–10.5)

## 2016-05-09 LAB — COMPREHENSIVE METABOLIC PANEL
ALBUMIN: 4 g/dL (ref 3.5–5.0)
ALT: 45 U/L (ref 17–63)
ANION GAP: 6 (ref 5–15)
AST: 37 U/L (ref 15–41)
Alkaline Phosphatase: 104 U/L (ref 38–126)
BUN: 38 mg/dL — ABNORMAL HIGH (ref 6–20)
CO2: 28 mmol/L (ref 22–32)
Calcium: 9.5 mg/dL (ref 8.9–10.3)
Chloride: 105 mmol/L (ref 101–111)
Creatinine, Ser: 1.96 mg/dL — ABNORMAL HIGH (ref 0.61–1.24)
GFR calc Af Amer: 45 mL/min — ABNORMAL LOW (ref >60)
GFR calc non Af Amer: 39 mL/min — ABNORMAL LOW (ref >60)
GLUCOSE: 146 mg/dL — AB (ref 65–99)
POTASSIUM: 4 mmol/L (ref 3.5–5.1)
SODIUM: 139 mmol/L (ref 135–145)
Total Bilirubin: 0.3 mg/dL (ref 0.3–1.2)
Total Protein: 7.9 g/dL (ref 6.5–8.1)

## 2016-05-09 LAB — PROTIME-INR
INR: 0.93
Prothrombin Time: 12.5 seconds (ref 11.4–15.2)

## 2016-05-09 LAB — CBG MONITORING, ED: Glucose-Capillary: 148 mg/dL — ABNORMAL HIGH (ref 65–99)

## 2016-05-09 LAB — TYPE AND SCREEN
ABO/RH(D): A POS
ANTIBODY SCREEN: NEGATIVE

## 2016-05-09 MED ORDER — FLUOCINONIDE 0.05 % EX CREA: 1.0000 "application " | TOPICAL_CREAM | Freq: Two times a day (BID) | CUTANEOUS | Status: DC

## 2016-05-09 MED ORDER — INSULIN PUMP
SUBCUTANEOUS | Status: DC
  Filled 2016-05-09: qty 1

## 2016-05-09 MED ORDER — MORPHINE SULFATE (PF) 2 MG/ML IV SOLN
4.0000 mg | Freq: Once | INTRAVENOUS | Status: AC
  Administered 2016-05-09: 4 mg via INTRAVENOUS
  Filled 2016-05-09: qty 2

## 2016-05-09 MED ORDER — PANTOPRAZOLE SODIUM 40 MG PO TBEC
40.0000 mg | DELAYED_RELEASE_TABLET | Freq: Every day | ORAL | Status: DC
  Administered 2016-05-11 – 2016-05-14 (×4): 40 mg via ORAL
  Filled 2016-05-09 (×6): qty 1

## 2016-05-09 MED ORDER — SIMVASTATIN 40 MG PO TABS
40.0000 mg | ORAL_TABLET | Freq: Every day | ORAL | Status: DC
  Administered 2016-05-10 – 2016-05-14 (×5): 40 mg via ORAL
  Filled 2016-05-09 (×7): qty 1

## 2016-05-09 MED ORDER — MORPHINE SULFATE (PF) 2 MG/ML IV SOLN
0.5000 mg | INTRAVENOUS | Status: DC | PRN
  Administered 2016-05-10 (×6): 0.5 mg via INTRAVENOUS
  Filled 2016-05-09 (×6): qty 1

## 2016-05-09 MED ORDER — OMEGA-3-ACID ETHYL ESTERS 1 G PO CAPS
2.0000 g | ORAL_CAPSULE | Freq: Two times a day (BID) | ORAL | Status: DC
  Administered 2016-05-10 – 2016-05-14 (×9): 2 g via ORAL
  Filled 2016-05-09 (×9): qty 2

## 2016-05-09 MED ORDER — FLUTICASONE PROPIONATE 50 MCG/ACT NA SUSP: 2.0000 | Freq: Every day | NASAL | Status: DC

## 2016-05-09 MED ORDER — LEVOTHYROXINE SODIUM 75 MCG PO TABS
175.0000 ug | ORAL_TABLET | Freq: Every day | ORAL | Status: DC
  Administered 2016-05-10 – 2016-05-14 (×5): 175 ug via ORAL
  Filled 2016-05-09 (×6): qty 1

## 2016-05-09 MED ORDER — FUROSEMIDE 20 MG PO TABS
20.0000 mg | ORAL_TABLET | Freq: Two times a day (BID) | ORAL | Status: DC
  Administered 2016-05-10 – 2016-05-14 (×8): 20 mg via ORAL
  Filled 2016-05-09 (×7): qty 1

## 2016-05-09 MED ORDER — FEBUXOSTAT 40 MG PO TABS
40.0000 mg | ORAL_TABLET | Freq: Every day | ORAL | Status: DC
  Administered 2016-05-11 – 2016-05-14 (×4): 40 mg via ORAL
  Filled 2016-05-09 (×5): qty 1

## 2016-05-09 MED ORDER — ACYCLOVIR 400 MG PO TABS
400.0000 mg | ORAL_TABLET | Freq: Two times a day (BID) | ORAL | Status: DC
  Administered 2016-05-10 – 2016-05-14 (×10): 400 mg via ORAL
  Filled 2016-05-09 (×10): qty 1

## 2016-05-09 MED ORDER — DULOXETINE HCL 60 MG PO CPEP
60.0000 mg | ORAL_CAPSULE | Freq: Every day | ORAL | Status: DC
  Administered 2016-05-10 – 2016-05-13 (×5): 60 mg via ORAL
  Filled 2016-05-09: qty 2
  Filled 2016-05-09 (×4): qty 1

## 2016-05-09 MED ORDER — ABACAVIR SULFATE-LAMIVUDINE 600-300 MG PO TABS
1.0000 | ORAL_TABLET | Freq: Every day | ORAL | Status: DC
  Filled 2016-05-09: qty 1

## 2016-05-09 MED ORDER — FLUDROCORTISONE ACETATE 0.1 MG PO TABS
0.1000 mg | ORAL_TABLET | Freq: Every day | ORAL | Status: DC
  Administered 2016-05-11 – 2016-05-14 (×4): 0.1 mg via ORAL
  Filled 2016-05-09 (×5): qty 1

## 2016-05-09 MED ORDER — NIACIN ER (ANTIHYPERLIPIDEMIC) 500 MG PO TBCR
1500.0000 mg | EXTENDED_RELEASE_TABLET | Freq: Every day | ORAL | Status: DC
  Administered 2016-05-10 – 2016-05-13 (×5): 1500 mg via ORAL
  Filled 2016-05-09 (×5): qty 3

## 2016-05-09 MED ORDER — METOCLOPRAMIDE HCL 5 MG PO TABS
5.0000 mg | ORAL_TABLET | Freq: Two times a day (BID) | ORAL | Status: DC
  Administered 2016-05-10 – 2016-05-14 (×9): 5 mg via ORAL
  Filled 2016-05-09 (×9): qty 1

## 2016-05-09 MED ORDER — DOLUTEGRAVIR SODIUM 50 MG PO TABS
50.0000 mg | ORAL_TABLET | Freq: Every day | ORAL | Status: DC
  Administered 2016-05-10 – 2016-05-13 (×5): 50 mg via ORAL
  Filled 2016-05-09 (×5): qty 1

## 2016-05-09 NOTE — H&P (Signed)
History and Physical    Patrick Brown WGN:[id number] DOB: [birth year] DOA: 05/09/2016  PCP: Redge Gainer, MD  Patient coming from: Home.  Chief Complaint: Fall.  HPI: Patrick Brown is a 46 y.o. male with history of diabetes mellitus type 1, chronic kidney disease stage III with history of hyperkalemia, hyperlipidemia, hypothyroidism and HIV had a fall while patient was jumping out of a truck. Denies hitting his head or losing consciousness.   ED Course: In the ER x-rays revealed right hip fracture. On-call orthopedic surgeon Dr. Mardelle Matte has been consulted. Patient denies any chest pain or shortness of breath.  Review of Systems: As per HPI, rest all negative.   Past Medical History:  Diagnosis Date  . Anemia, iron deficiency On procrit  . CAP (community acquired pneumonia)   . CKD (chronic kidney disease)   . CKD (chronic kidney disease) stage 3, GFR 30-59 ml/min   . Degenerative arthritis   . Depression   . Dyslipidemia   . Gastroesophageal reflux disease   . Gastroparesis diabeticorum ([place])   . Hematuria, microscopic 10/09   work up negative (Dr. Amalia Hailey)  . HIV positive ([place])   . 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 Hypothyroidism   . IDDM (insulin dependent diabetes mellitus) ([place])    38 years  . Low HDL (under 40)   . Proteinuria   . Retinopathy    x2  . SIRS (systemic inflammatory response syndrome) (HCC)     Past Surgical History:  Procedure Laterality Date  . CATARACT EXTRACTION Right   . COLONOSCOPY    . ESOPHAGOGASTRODUODENOSCOPY    . EYE SURGERY  2006,2001   x2   . insulin pump    . LASIK Bilateral   . VIDEO BRONCHOSCOPY Bilateral 12/15/2012   Procedure: VIDEO BRONCHOSCOPY WITH FLUORO;  Surgeon: Kathee Delton, MD;  Location: [facility]  ENDOSCOPY;  Service: Cardiopulmonary;  Laterality: Bilateral;  . VITRECTOMY  bilateral     reports that he has never smoked. He has never used smokeless tobacco. He reports that he does not drink alcohol or use drugs.  Allergies  Allergen Reactions  . Sulfa Antibiotics Other (See Comments)    High potassium  . Ramipril Cough  . Versed [Midazolam] Other (See Comments)    "I don't wake up very good or clear it out of my system"    Family History  Problem Relation Age of Onset  . Diabetes type I Brother   . Prostate cancer Other   . Dementia Other   . Dementia Other   . Dementia Other     Prior to Admission medications   Medication Sig Start Date End Date Taking? Authorizing Provider  abacavir-lamiVUDine (EPZICOM) 600-300 MG tablet Take 1 tablet by mouth daily. 04/25/16  Yes Thayer Headings, MD  acetaminophen (TYLENOL) 500 MG tablet Take 1,000 mg by mouth 2 (two) times daily.    Yes Historical Provider, MD  acyclovir (ZOVIRAX) 400 MG tablet TAKE 1 TABLET (400 MG TOTAL) BY MOUTH 2 (TWO) TIMES DAILY. 09/02/14  Yes Historical Provider, MD  ANDROGEL PUMP 20.25 MG/ACT (1.62%) GEL APPLY 2 PUMPS AS DIRECTED ONCE A DAY 01/31/16  Yes Chipper Herb, MD  aspirin 81 MG chewable tablet Chew 81 mg by mouth every morning.   Yes Historical Provider, MD  dolutegravir (TIVICAY) 50  MG tablet Take 1 tablet (50 mg total) by mouth daily. 04/25/16  Yes Thayer Headings, MD  DULoxetine (CYMBALTA) 60 MG capsule TAKE 1 CAPSULE (60 MG TOTAL) BY MOUTH 2 (TWO) TIMES DAILY. AS DIRECTED Patient taking differently: Take 60 mg by mouth daily. As directed 12/08/15  Yes Chipper Herb, MD  esomeprazole (NEXIUM) 40 MG capsule TAKE ONE CAPSULE BY MOUTH DAILY 01/15/16  Yes Chipper Herb, MD  ferrous sulfate 325 (65 FE) MG tablet Take 650 mg by mouth daily with breakfast.    Yes Historical Provider, MD  fludrocortisone (FLORINEF) 0.1 MG tablet Take 0.1 mg by mouth daily.    Yes Historical Provider, MD  fluocinonide cream  (LIDEX) 4.12 % Apply 1 application topically 2 (two) times daily.   Yes Historical Provider, MD  fluticasone (FLONASE) 50 MCG/ACT nasal spray Place 2 sprays into both nostrils daily. 11/15/14  Yes Chipper Herb, MD  furosemide (LASIX) 40 MG tablet Take 20 mg by mouth 2 (two) times daily. 11/07/14  Yes Historical Provider, MD  Glucosamine-Chondroit-Vit C-Mn (GLUCOSAMINE 1500 COMPLEX PO) Take 1 tablet by mouth 3 (three) times daily.    Yes Historical Provider, MD  HUMALOG 100 UNIT/ML injection USE PER PUMP AS DIRECTED 01/31/16  Yes Chipper Herb, MD  levothyroxine (SYNTHROID, LEVOTHROID) 175 MCG tablet Take 1 tablet (175 mcg total) by mouth daily before breakfast. Patient taking differently: Take 175 mcg by mouth daily before breakfast. Monday- Friday, half tab on Saturday and Sunday 04/03/15  Yes Chipper Herb, MD  LORazepam (ATIVAN) 0.5 MG tablet TAKE 1 TABLET BY MOUTH EVERY DAY AS NEEDED 10/12/13  Yes Chipper Herb, MD  metoCLOPramide (REGLAN) 5 MG tablet TAKE 1 TABLET BY MOUTH 3 TIMES A DAY BEFORE MEALS 10/02/15  Yes Chipper Herb, MD  niacin (NIASPAN) 1000 MG CR tablet TAKE 1 TABLET (1,000 MG TOTAL) BY MOUTH AT BEDTIME. Patient taking differently: TAKE 1 TABLET (1,500 MG TOTAL) BY MOUTH AT BEDTIME. 05/22/15  Yes Chipper Herb, MD  omega-3 acid ethyl esters (LOVAZA) 1 g capsule TAKE 2 CAPSULES (2 G TOTAL) BY MOUTH 2 (TWO) TIMES DAILY. 08/02/15  Yes Chipper Herb, MD  ondansetron (ZOFRAN) 4 MG tablet Take 1 tablet (4 mg total) by mouth every 8 (eight) hours as needed for nausea. 12/06/15  Yes Chipper Herb, MD  OVER THE COUNTER MEDICATION Take 1 capsule by mouth daily. [name]- probiotic daily   Yes Historical Provider, MD  simvastatin (ZOCOR) 40 MG tablet TAKE 1 TABLET (40 MG TOTAL) BY MOUTH AT BEDTIME. 01/29/16  Yes Chipper Herb, MD  traMADol (ULTRAM) 50 MG tablet TAKE 1 TABLET BY MOUTH EVERY 6 HOURS AS NEEDED FOR PAIN 03/28/14  Yes Chipper Herb, MD  ULORIC 40 MG tablet TAKE 1 TABLET BY MOUTH  EVERY DAY 01/15/16  Yes Chipper Herb, MD  amoxicillin-clavulanate (AUGMENTIN) 875-125 MG tablet Take 1 tablet by mouth 2 (two) times daily. Patient not taking: Reported on 05/09/2016 04/23/16   Wardell Honour, MD  glucose blood test strip Test BS QID and PRN. 12/06/15   Chipper Herb, MD  Lancets MISC Test BS QID and PRN. E10.65 12/06/15   Chipper Herb, MD    Physical Exam: Vitals:   05/09/16 2032 05/09/16 2046 05/09/16 2200 05/09/16 2300  BP:  (!) 144/93 (!) 140/99 (!) 154/112  Pulse:  97 93 97  Resp:  20  20  Temp:  98.2 F (36.8 C)  TempSrc:  Oral    SpO2:  100%  98%  Weight: 92.5 kg (204 lb)     Height: 5\' 10"  (1.778 m)         Constitutional: Moderately built and nourished. Vitals:   05/09/16 2032 05/09/16 2046 05/09/16 2200 05/09/16 2300  BP:  (!) 144/93 (!) 140/99 (!) 154/112  Pulse:  97 93 97  Resp:  20  20  Temp:  98.2 F (36.8 C)    TempSrc:  Oral    SpO2:  100%  98%  Weight: 92.5 kg (204 lb)     Height: 5\' 10"  (1.778 m)      Eyes: Anicteric no pallor. ENMT: No discharge from the ears eyes nose and mouth. Neck: No mass felt. No neck rigidity. Respiratory: No rhonchi or crepitations. Cardiovascular: S1 and S2. No murmurs appreciated. Abdomen: Soft nontender bowel sounds present. Musculoskeletal: No edema. Pain are moving right hip. Skin: No rash. Skin appears warm. Neurologic: Alert awake oriented to time place and person. Moves all extremities. Psychiatric: Appears normal.   Labs on Admission: I have personally reviewed following labs and imaging studies  CBC:  Recent Labs Lab 05/06/16 0808 05/09/16 2020  WBC 8.4 10.6*  NEUTROABS 4.0 7.0  HGB  --  14.1  HCT 42.4 39.2  MCV 99* 92.7  PLT 231 300   Basic Metabolic Panel:  Recent Labs Lab 05/06/16 0808 05/09/16 2020  NA 140 139  K 4.1 4.0  CL 99 105  CO2 22 28  GLUCOSE 179* 146*  BUN 34* 38*  CREATININE 1.83* 1.96*  CALCIUM 9.6 9.5   GFR: Estimated Creatinine Clearance: 53.2  mL/min (A) (by C-G formula based on SCr of 1.96 mg/dL (H)). Liver Function Tests:  Recent Labs Lab 05/06/16 0808 05/09/16 2020  AST 38 37  ALT 44 45  ALKPHOS 120* 104  BILITOT 0.3 0.3  PROT 7.2 7.9  ALBUMIN 4.3 4.0   No results for input(s): LIPASE, AMYLASE in the last 168 hours. No results for input(s): AMMONIA in the last 168 hours. Coagulation Profile:  Recent Labs Lab 05/09/16 2020  INR 0.93   Cardiac Enzymes: No results for input(s): CKTOTAL, CKMB, CKMBINDEX, TROPONINI in the last 168 hours. BNP (last 3 results) No results for input(s): PROBNP in the last 8760 hours. HbA1C: No results for input(s): HGBA1C in the last 72 hours. CBG:  Recent Labs Lab 05/09/16 2050  GLUCAP 148*   Lipid Profile: No results for input(s): CHOL, HDL, LDLCALC, TRIG, CHOLHDL, LDLDIRECT in the last 72 hours. Thyroid Function Tests: No results for input(s): TSH, T4TOTAL, FREET4, T3FREE, THYROIDAB in the last 72 hours. Anemia Panel: No results for input(s): VITAMINB12, FOLATE, FERRITIN, TIBC, IRON, RETICCTPCT in the last 72 hours. Urine analysis:    Component Value Date/Time   COLORURINE AMBER (A) 08/05/2012 1800   APPEARANCEUR Clear 04/18/2016 1647   LABSPEC 1.024 08/05/2012 1800   LABSPEC 1.030 06/11/2005 1117   PHURINE 5.0 08/05/2012 1800   GLUCOSEU Negative 04/18/2016 1647   HGBUR NEGATIVE 08/05/2012 1800   BILIRUBINUR Negative 04/18/2016 1647   BILIRUBINUR Negative 06/11/2005 1117   KETONESUR NEGATIVE 08/05/2012 1800   PROTEINUR Negative 04/18/2016 1647   PROTEINUR NEGATIVE 08/05/2012 1800   UROBILINOGEN negative 04/03/2015 1651   UROBILINOGEN 1.0 08/05/2012 1800   NITRITE Negative 04/18/2016 1647   NITRITE NEGATIVE 08/05/2012 1800   LEUKOCYTESUR Negative 04/18/2016 1647   LEUKOCYTESUR Negative 06/11/2005 1117   Sepsis Labs: @LABRCNTIP (procalcitonin:4,lacticidven:4) )No results found for this or any previous visit (  from the past 240 hour(s)).   Radiological Exams on  Admission: Dg Hip Unilat  With Pelvis 2-3 Views Right  Result Date: 05/09/2016 CLINICAL DATA:  46 year old male fell off of vehicle with right hip pain unable to move the leg EXAM: DG HIP (WITH OR WITHOUT PELVIS) 2-3V RIGHT COMPARISON:  None. FINDINGS: The left femoral head projects in joint. The pubic symphysis appears intact. The SI joints are patent. Transcervical right femoral fracture with mild superior displacement of distal fracture fragment. No significant angulation. Right femoral head does not appear dislocated. IMPRESSION: Slightly displaced transcervical fracture of the proximal right femur Electronically Signed   By: Donavan Foil M.D.   On: 05/09/2016 21:13     Assessment/Plan Principal Problem:   Closed right hip fracture, initial encounter [facility]) Active Problems:   Type 1 diabetes mellitus (HCC)   Acquired hypothyroidism   Convulsions/seizures (HCC)   HIV disease (HCC)   OSA (obstructive sleep apnea)   Hip fracture (HCC)    1. Closed right hip fracture status post mechanical fall - patient will be at moderate risk for intermediate risk procedure. Patient will be kept nothing by mouth after midnight except medications as requested by Dr. land orthopedic surgeon. 2. Diabetes mellitus type 1 - patient is on insulin pump. Patient's basal insulin requirement is around 40 units per day. I will keep patient on Lantus insulin 15 units and sliding scale coverage and restart insulin pump after surgery. 3. Chronic disease stage III with history of hyperkalemia on Florinef and Lasix which should be continued. Follow metabolic panel. 4. HIV last CD4 count in February 2018 was 310 - continue antiretroviral. 5. Sleep apnea on CPAP. 6. Hypothyroidism on Synthroid. 7. Gout on ULORIC. 8. Hyperlipidemia on statins.  Patient will be transferred to [facility] for further care. Dr. Alcario Drought will be the accepting physician. Patient agreeable to transfer.  Preop EKG and chest x-ray  pending.   DVT prophylaxis: SCDs. Code Status: Full code.  Family Communication: Patient's mother.  Disposition Plan: Probably rehabilitation.  Consults called: Orthopedics.  Admission status: Inpatient.    Rise Patience MD [facility] Pager [phone].  If 7PM-7AM, please contact night-coverage [email or web address] Password [facility]  05/09/2016, 11:33 PM

## 2016-05-09 NOTE — ED Notes (Signed)
Bed: WA03 Expected date:  Expected time:  Means of arrival:  Comments: 

## 2016-05-09 NOTE — ED Provider Notes (Signed)
[place] DEPT Provider Note   CSN: [id number] Arrival date & time: 05/09/16  2018     History   Chief Complaint Chief Complaint  Patient presents with  . Fall    HPI Lennis Liskey is a 46 y.o. male.  Patient presents with R hip pain after falling off truck (about 5 feet above ground) and landing on hip. States since then he is unable to bear weight without being in extreme pain. Has not tried walking since incident happened. Improvement in pain with sitting or laying down. But at it's worse, the pain is 10/10. He reports some abrasions on his R elbow but not concerned about any pain there. Has not tried any medication to alleviate the pain before arrival. Denies numbness, weakness, prior injury to this area, prior surgery, loss of bowel or bladder function, chest pain, trouble breathing, headache or head injury.      Past Medical History:  Diagnosis Date  . Anemia, iron deficiency On procrit  . CAP (community acquired pneumonia)   . CKD (chronic kidney disease)   . CKD (chronic kidney disease) stage 3, GFR 30-59 ml/min   . Degenerative arthritis   . Depression   . Dyslipidemia   . Gastroesophageal reflux disease   . Gastroparesis diabeticorum ([place])   . Hematuria, microscopic 10/09   work up negative (Dr. Amalia Hailey)  . HIV positive ([place])   . 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 Hypothyroidism   . IDDM (insulin dependent diabetes mellitus) ([place])    38 years  . Low HDL (under 40)   . Proteinuria   . Retinopathy    x2  . SIRS (systemic inflammatory response syndrome) [facility])     Patient Active Problem List   Diagnosis Date Noted  . Benign prostatic hyperplasia without lower urinary tract symptoms 04/18/2016  . Vitamin D deficiency 04/18/2016    . OSA (obstructive sleep apnea) 10/06/2014  . Screening examination for venereal disease 12/23/2013  . Pseudophakia of both eyes 06/23/2013  . Pain in joint, lower leg 11/19/2012  . Unspecified sinusitis (chronic) 09/16/2012  . Right groin wound 08/09/2012  . HIV disease ([place]) 06/02/2012  . Convulsions/seizures ([place]) 05/16/2012  . Pulmonary infiltrate 04/18/2012  . Gastroparesis 04/18/2012  . Anemia, iron deficiency   . Type 1 diabetes mellitus ([place]) 05/08/2011  . Acquired hypothyroidism 05/08/2011  . Chronic kidney disease, stage 3, mod decreased GFR 12/31/2010    Past Surgical History:  Procedure Laterality Date  . CATARACT EXTRACTION Right   . COLONOSCOPY    . ESOPHAGOGASTRODUODENOSCOPY    . EYE SURGERY  2006,2001   x2   . insulin pump    . LASIK Bilateral   . VIDEO BRONCHOSCOPY Bilateral 12/15/2012   Procedure: VIDEO BRONCHOSCOPY WITH FLUORO;  Surgeon: Kathee Delton, MD;  Location: [facility] ENDOSCOPY;  Service: Cardiopulmonary;  Laterality: Bilateral;  . VITRECTOMY  bilateral       Home Medications    Prior to Admission medications   Medication Sig Start Date End Date Taking? Authorizing Provider  abacavir-lamiVUDine (EPZICOM) 600-300 MG tablet Take 1 tablet by mouth daily. 04/25/16  Yes Thayer Headings, MD  acetaminophen (TYLENOL) 500 MG tablet Take 1,000 mg by mouth 2 (two) times daily.    Yes Historical Provider, MD  acyclovir (ZOVIRAX) 400 MG tablet TAKE 1 TABLET (  400 MG TOTAL) BY MOUTH 2 (TWO) TIMES DAILY. 09/02/14  Yes Historical Provider, MD  ANDROGEL PUMP 20.25 MG/ACT (1.62%) GEL APPLY 2 PUMPS AS DIRECTED ONCE A DAY 01/31/16  Yes Chipper Herb, MD  aspirin 81 MG chewable tablet Chew 81 mg by mouth every morning.   Yes Historical Provider, MD  dolutegravir (TIVICAY) 50 MG tablet Take 1 tablet (50 mg total) by mouth daily. 04/25/16  Yes Thayer Headings, MD  DULoxetine (CYMBALTA) 60 MG capsule TAKE 1 CAPSULE (60 MG TOTAL) BY MOUTH 2 (TWO) TIMES DAILY. AS DIRECTED Patient  taking differently: Take 60 mg by mouth daily. As directed 12/08/15  Yes Chipper Herb, MD  esomeprazole (NEXIUM) 40 MG capsule TAKE ONE CAPSULE BY MOUTH DAILY 01/15/16  Yes Chipper Herb, MD  ferrous sulfate 325 (65 FE) MG tablet Take 650 mg by mouth daily with breakfast.    Yes Historical Provider, MD  fludrocortisone (FLORINEF) 0.1 MG tablet Take 0.1 mg by mouth daily.    Yes Historical Provider, MD  fluocinonide cream (LIDEX) 6.25 % Apply 1 application topically 2 (two) times daily.   Yes Historical Provider, MD  fluticasone (FLONASE) 50 MCG/ACT nasal spray Place 2 sprays into both nostrils daily. 11/15/14  Yes Chipper Herb, MD  furosemide (LASIX) 40 MG tablet Take 20 mg by mouth 2 (two) times daily. 11/07/14  Yes Historical Provider, MD  Glucosamine-Chondroit-Vit C-Mn (GLUCOSAMINE 1500 COMPLEX PO) Take 1 tablet by mouth 3 (three) times daily.    Yes Historical Provider, MD  HUMALOG 100 UNIT/ML injection USE PER PUMP AS DIRECTED 01/31/16  Yes Chipper Herb, MD  levothyroxine (SYNTHROID, LEVOTHROID) 175 MCG tablet Take 1 tablet (175 mcg total) by mouth daily before breakfast. Patient taking differently: Take 175 mcg by mouth daily before breakfast. Monday- Friday, half tab on Saturday and Sunday 04/03/15  Yes Chipper Herb, MD  LORazepam (ATIVAN) 0.5 MG tablet TAKE 1 TABLET BY MOUTH EVERY DAY AS NEEDED 10/12/13  Yes Chipper Herb, MD  metoCLOPramide (REGLAN) 5 MG tablet TAKE 1 TABLET BY MOUTH 3 TIMES A DAY BEFORE MEALS 10/02/15  Yes Chipper Herb, MD  niacin (NIASPAN) 1000 MG CR tablet TAKE 1 TABLET (1,000 MG TOTAL) BY MOUTH AT BEDTIME. Patient taking differently: TAKE 1 TABLET (1,500 MG TOTAL) BY MOUTH AT BEDTIME. 05/22/15  Yes Chipper Herb, MD  omega-3 acid ethyl esters (LOVAZA) 1 g capsule TAKE 2 CAPSULES (2 G TOTAL) BY MOUTH 2 (TWO) TIMES DAILY. 08/02/15  Yes Chipper Herb, MD  ondansetron (ZOFRAN) 4 MG tablet Take 1 tablet (4 mg total) by mouth every 8 (eight) hours as needed for nausea.  12/06/15  Yes Chipper Herb, MD  OVER THE COUNTER MEDICATION Take 1 capsule by mouth daily. [name]- probiotic daily   Yes Historical Provider, MD  simvastatin (ZOCOR) 40 MG tablet TAKE 1 TABLET (40 MG TOTAL) BY MOUTH AT BEDTIME. 01/29/16  Yes Chipper Herb, MD  traMADol (ULTRAM) 50 MG tablet TAKE 1 TABLET BY MOUTH EVERY 6 HOURS AS NEEDED FOR PAIN 03/28/14  Yes Chipper Herb, MD  ULORIC 40 MG tablet TAKE 1 TABLET BY MOUTH EVERY DAY 01/15/16  Yes Chipper Herb, MD  amoxicillin-clavulanate (AUGMENTIN) 875-125 MG tablet Take 1 tablet by mouth 2 (two) times daily. Patient not taking: Reported on 05/09/2016 04/23/16   Wardell Honour, MD  glucose blood test strip Test BS QID and PRN. 12/06/15   Chipper Herb, MD  Lancets MISC  Test BS QID and PRN. E10.65 12/06/15   Chipper Herb, MD    Family History Family History  Problem Relation Age of Onset  . Diabetes type I Brother   . Prostate cancer Other   . Dementia Other   . Dementia Other   . Dementia Other     Social History Social History  Substance Use Topics  . Smoking status: Never Smoker  . Smokeless tobacco: Never Used  . Alcohol use No     Comment: Infrequent, less than 1 x per month.     Allergies   Sulfa antibiotics; Ramipril; and Versed [midazolam]   Review of Systems Review of Systems  Constitutional: Negative for appetite change, chills and fever.  HENT: Negative for sneezing.   Eyes: Negative for visual disturbance.  Respiratory: Negative for cough, chest tightness, shortness of breath and wheezing.   Cardiovascular: Negative for chest pain and palpitations.  Gastrointestinal: Negative for abdominal pain, blood in stool, constipation, diarrhea, nausea and vomiting.  Genitourinary: Negative for dysuria, hematuria and urgency.  Musculoskeletal: Positive for arthralgias. Negative for myalgias.  Skin: Positive for wound. Negative for color change and rash.  Neurological: Negative for dizziness, weakness,  light-headedness and headaches.     Physical Exam Updated Vital Signs BP (!) 144/93 (BP Location: Right Arm)   Pulse 97   Temp 98.2 F (36.8 C) (Oral)   Resp 20   Ht 5\' 10"  (1.778 m)   Wt 92.5 kg   SpO2 100%   BMI 29.27 kg/m   Physical Exam  Constitutional: He appears well-developed and well-nourished. No distress.  HENT:  Head: Normocephalic and atraumatic.  Nose: Nose normal.  Eyes: Conjunctivae and EOM are normal. Left eye exhibits no discharge. No scleral icterus.  Neck: Normal range of motion. Neck supple.  Cardiovascular: Normal rate, regular rhythm, normal heart sounds and intact distal pulses.  Exam reveals no gallop and no friction rub.   No murmur heard. Pulmonary/Chest: Effort normal and breath sounds normal. No respiratory distress.  Abdominal: Soft. Bowel sounds are normal. He exhibits no distension. There is no tenderness. There is no guarding.  Musculoskeletal: Normal range of motion. He exhibits tenderness. He exhibits no edema.       Right hip: He exhibits tenderness. He exhibits no laceration.  TTP of R hip joint. No bruising or abrasion noted. There is superficial abrasion noted on R elbow.  Neurological: He is alert. He exhibits normal muscle tone. Coordination normal.  Skin: Skin is warm and dry. No rash noted.  Psychiatric: He has a normal mood and affect.  Nursing note and vitals reviewed.    ED Treatments / Results  Labs (all labs ordered are listed, but only abnormal results are displayed) Labs Reviewed  CBG MONITORING, ED - Abnormal; Notable for the following:       Result Value   Glucose-Capillary 148 (*)    All other components within normal limits  COMPREHENSIVE METABOLIC PANEL  CBC WITH DIFFERENTIAL/PLATELET  PROTIME-INR  TYPE AND SCREEN    EKG  EKG Interpretation None       Radiology Dg Hip Unilat  With Pelvis 2-3 Views Right  Result Date: 05/09/2016 CLINICAL DATA:  46 year old male fell off of vehicle with right hip pain  unable to move the leg EXAM: DG HIP (WITH OR WITHOUT PELVIS) 2-3V RIGHT COMPARISON:  None. FINDINGS: The left femoral head projects in joint. The pubic symphysis appears intact. The SI joints are patent. Transcervical right femoral fracture with mild superior  displacement of distal fracture fragment. No significant angulation. Right femoral head does not appear dislocated. IMPRESSION: Slightly displaced transcervical fracture of the proximal right femur Electronically Signed   By: Donavan Foil M.D.   On: 05/09/2016 21:13    Procedures Procedures (including critical care time)  Medications Ordered in ED Medications  morphine 2 MG/ML injection 4 mg (not administered)     Initial Impression / Assessment and Plan / ED Course  I have reviewed the triage vital signs and the nursing notes.  Pertinent labs & imaging results that were available during my care of the patient were reviewed by me and considered in my medical decision making (see chart for details).     Patient's history and symptoms concerning for acute fracture vs. Contusion. X-ray of R hip was ordered and showed slightly displaced transcervical fracture of the proximal R femur. Spoke to ortho who would like patient admitted to [facility] and made NPO. Orthopedist Dr. Mardelle Matte will see the patient in the AM and determine course of action. States he may need total hip replacement considering comorbidities present. Hospitalist will admit.  Patient was seen by and discussed with Dr. Laverta Baltimore.   Final Clinical Impressions(s) / ED Diagnoses   Final diagnoses:  None    New Prescriptions New Prescriptions   No medications on file     Delia Heady, Hershal Coria 05/09/16 [place], MD 05/10/16 1308

## 2016-05-09 NOTE — Progress Notes (Signed)
Gerome Apley with ER MD.  46 yo w HIV and CKD 3 and seizures with uncontrolled type 1 DM who fell off a truck today with acute right hip pain.   XR with varus comminuted displaced right femoral neck fracture.   Likelihood of healing this with pinning is low, recommend hemiarthroplasty.   Plan npo after midnight, surgery likely tomorrow.  Full consult to follow.   Discussed plan and RBA with patient.    Johnny Bridge, MD

## 2016-05-09 NOTE — ED Triage Notes (Signed)
[address] out of truck about 49ft and landed on right hip no deformity no LOC good csm in right leg noted.

## 2016-05-10 ENCOUNTER — Inpatient Hospital Stay (HOSPITAL_COMMUNITY): Payer: Worker's Compensation

## 2016-05-10 ENCOUNTER — Encounter (HOSPITAL_COMMUNITY): Payer: Self-pay | Admitting: Anesthesiology

## 2016-05-10 ENCOUNTER — Inpatient Hospital Stay (HOSPITAL_COMMUNITY): Payer: Worker's Compensation | Admitting: Anesthesiology

## 2016-05-10 ENCOUNTER — Encounter (HOSPITAL_COMMUNITY)
Admission: EM | Disposition: A | Discharge status: Home Health | Payer: Self-pay | Source: Home / Self Care | Attending: Internal Medicine

## 2016-05-10 HISTORY — PX: HIP ARTHROPLASTY: SHX981

## 2016-05-10 LAB — COMPREHENSIVE METABOLIC PANEL
ALBUMIN: 3.3 g/dL — AB (ref 3.5–5.0)
ALT: 37 U/L (ref 17–63)
AST: 26 U/L (ref 15–41)
Alkaline Phosphatase: 93 U/L (ref 38–126)
Anion gap: 12 (ref 5–15)
BUN: 30 mg/dL — AB (ref 6–20)
CHLORIDE: 101 mmol/L (ref 101–111)
CO2: 22 mmol/L (ref 22–32)
Calcium: 9.2 mg/dL (ref 8.9–10.3)
Creatinine, Ser: 1.82 mg/dL — ABNORMAL HIGH (ref 0.61–1.24)
GFR calc Af Amer: 49 mL/min — ABNORMAL LOW (ref >60)
GFR, EST NON AFRICAN AMERICAN: 43 mL/min — AB (ref >60)
GLUCOSE: 336 mg/dL — AB (ref 65–99)
POTASSIUM: 4.4 mmol/L (ref 3.5–5.1)
Sodium: 135 mmol/L (ref 135–145)
Total Bilirubin: 0.7 mg/dL (ref 0.3–1.2)
Total Protein: 6.5 g/dL (ref 6.5–8.1)

## 2016-05-10 LAB — GLUCOSE, CAPILLARY
GLUCOSE-CAPILLARY: 368 mg/dL — AB (ref 65–99)
GLUCOSE-CAPILLARY: 314 mg/dL — AB (ref 65–99)
GLUCOSE-CAPILLARY: 372 mg/dL — AB (ref 65–99)
Glucose-Capillary: 273 mg/dL — ABNORMAL HIGH (ref 65–99)
Glucose-Capillary: 333 mg/dL — ABNORMAL HIGH (ref 65–99)
Glucose-Capillary: 285 mg/dL — ABNORMAL HIGH (ref 65–99)
Glucose-Capillary: 313 mg/dL — ABNORMAL HIGH (ref 65–99)
Glucose-Capillary: 350 mg/dL — ABNORMAL HIGH (ref 65–99)

## 2016-05-10 LAB — CBC WITH DIFFERENTIAL/PLATELET
BASOS ABS: 0.1 10*3/uL (ref 0.0–0.1)
BASOS PCT: 1 %
EOS ABS: 0.1 10*3/uL (ref 0.0–0.7)
EOS PCT: 1 %
HCT: 36.5 % — ABNORMAL LOW (ref 39.0–52.0)
Hemoglobin: 12.8 g/dL — ABNORMAL LOW (ref 13.0–17.0)
Lymphocytes Relative: 24 %
Lymphs Abs: 2.5 10*3/uL (ref 0.7–4.0)
MCH: 33.2 pg (ref 26.0–34.0)
MCHC: 35.1 g/dL (ref 30.0–36.0)
MCV: 94.8 fL (ref 78.0–100.0)
MONO ABS: 1.3 10*3/uL — AB (ref 0.1–1.0)
Monocytes Relative: 12 %
Neutro Abs: 6.8 10*3/uL (ref 1.7–7.7)
Neutrophils Relative %: 62 %
PLATELETS: 206 10*3/uL (ref 150–400)
RBC: 3.85 MIL/uL — ABNORMAL LOW (ref 4.22–5.81)
RDW: 12.6 % (ref 11.5–15.5)
WBC: 10.7 10*3/uL — ABNORMAL HIGH (ref 4.0–10.5)

## 2016-05-10 LAB — SURGICAL PCR SCREEN
MRSA, PCR: NEGATIVE
STAPHYLOCOCCUS AUREUS: NEGATIVE

## 2016-05-10 LAB — ABO/RH
ABO/RH(D): A POS
ABO/RH(D): A POS

## 2016-05-10 LAB — TYPE AND SCREEN
ABO/RH(D): A POS
ANTIBODY SCREEN: NEGATIVE

## 2016-05-10 LAB — CBG MONITORING, ED: Glucose-Capillary: 145 mg/dL — ABNORMAL HIGH (ref 65–99)

## 2016-05-10 SURGERY — HEMIARTHROPLASTY, HIP, DIRECT ANTERIOR APPROACH, FOR FRACTURE
Anesthesia: General | Site: Hip | Laterality: Right

## 2016-05-10 MED ORDER — ARTIFICIAL TEARS OP OINT
TOPICAL_OINTMENT | OPHTHALMIC | Status: AC
  Filled 2016-05-10: qty 7

## 2016-05-10 MED ORDER — PROPOFOL 10 MG/ML IV BOLUS
INTRAVENOUS | Status: AC
  Filled 2016-05-10: qty 20

## 2016-05-10 MED ORDER — OXYCODONE HCL 5 MG PO TABS
5.0000 mg | ORAL_TABLET | ORAL | Status: DC | PRN
  Administered 2016-05-10 – 2016-05-14 (×16): 10 mg via ORAL
  Filled 2016-05-10 (×17): qty 2

## 2016-05-10 MED ORDER — SENNA 8.6 MG PO TABS
1.0000 | ORAL_TABLET | Freq: Two times a day (BID) | ORAL | Status: DC
  Administered 2016-05-10 – 2016-05-14 (×7): 8.6 mg via ORAL
  Filled 2016-05-10 (×8): qty 1

## 2016-05-10 MED ORDER — POVIDONE-IODINE 10 % EX SWAB: 2.0000 "application " | Freq: Once | CUTANEOUS | Status: DC

## 2016-05-10 MED ORDER — FENTANYL CITRATE (PF) 100 MCG/2ML IJ SOLN
INTRAMUSCULAR | Status: DC | PRN
  Administered 2016-05-10 (×4): 50 ug via INTRAVENOUS

## 2016-05-10 MED ORDER — CEFAZOLIN SODIUM-DEXTROSE 2-4 GM/100ML-% IV SOLN
2.0000 g | INTRAVENOUS | Status: AC
  Administered 2016-05-10: 2 g via INTRAVENOUS

## 2016-05-10 MED ORDER — PHENOL 1.4 % MT LIQD: 1.0000 | OROMUCOSAL | Status: DC | PRN

## 2016-05-10 MED ORDER — METHOCARBAMOL 1000 MG/10ML IJ SOLN
500.0000 mg | Freq: Four times a day (QID) | INTRAVENOUS | Status: DC | PRN
  Administered 2016-05-10 – 2016-05-12 (×6): 500 mg via INTRAVENOUS
  Filled 2016-05-10 (×12): qty 5

## 2016-05-10 MED ORDER — ACETAMINOPHEN 650 MG RE SUPP: 650.0000 mg | Freq: Four times a day (QID) | RECTAL | Status: DC | PRN

## 2016-05-10 MED ORDER — ABACAVIR SULFATE 300 MG PO TABS
600.0000 mg | ORAL_TABLET | Freq: Every day | ORAL | Status: DC
  Administered 2016-05-10 – 2016-05-13 (×4): 600 mg via ORAL
  Filled 2016-05-10 (×5): qty 2

## 2016-05-10 MED ORDER — BISACODYL 10 MG RE SUPP
10.0000 mg | Freq: Every day | RECTAL | Status: DC | PRN
  Filled 2016-05-10: qty 1

## 2016-05-10 MED ORDER — LACTATED RINGERS IV SOLN
INTRAVENOUS | Status: DC | PRN
  Administered 2016-05-10 (×2): via INTRAVENOUS

## 2016-05-10 MED ORDER — ACETAMINOPHEN 325 MG PO TABS
650.0000 mg | ORAL_TABLET | Freq: Four times a day (QID) | ORAL | Status: DC | PRN
  Administered 2016-05-11 – 2016-05-12 (×4): 650 mg via ORAL
  Filled 2016-05-10 (×4): qty 2

## 2016-05-10 MED ORDER — ONDANSETRON HCL 4 MG/2ML IJ SOLN
INTRAMUSCULAR | Status: DC | PRN
  Administered 2016-05-10: 4 mg via INTRAVENOUS

## 2016-05-10 MED ORDER — CEFAZOLIN SODIUM-DEXTROSE 2-4 GM/100ML-% IV SOLN: 2.0000 g | INTRAVENOUS | Status: DC

## 2016-05-10 MED ORDER — MAGNESIUM CITRATE PO SOLN: 1.0000 | Freq: Once | ORAL | Status: DC | PRN

## 2016-05-10 MED ORDER — HYDROMORPHONE HCL 1 MG/ML IJ SOLN: 0.2500 mg | INTRAMUSCULAR | Status: DC | PRN

## 2016-05-10 MED ORDER — LAMIVUDINE 150 MG PO TABS
300.0000 mg | ORAL_TABLET | Freq: Every day | ORAL | Status: DC
  Administered 2016-05-10 – 2016-05-13 (×5): 300 mg via ORAL
  Filled 2016-05-10 (×5): qty 2

## 2016-05-10 MED ORDER — OXYCODONE-ACETAMINOPHEN 5-325 MG PO TABS: 1.0000 | ORAL_TABLET | Freq: Four times a day (QID) | ORAL | 0 refills | Status: DC | PRN

## 2016-05-10 MED ORDER — EPHEDRINE SULFATE-NACL 50-0.9 MG/10ML-% IV SOSY
PREFILLED_SYRINGE | INTRAVENOUS | Status: DC | PRN
  Administered 2016-05-10: 10 mg via INTRAVENOUS

## 2016-05-10 MED ORDER — CHLORHEXIDINE GLUCONATE 4 % EX LIQD: 60.0000 mL | Freq: Once | CUTANEOUS | Status: DC

## 2016-05-10 MED ORDER — ALUM & MAG HYDROXIDE-SIMETH 200-200-20 MG/5ML PO SUSP: 30.0000 mL | ORAL | Status: DC | PRN

## 2016-05-10 MED ORDER — ROCURONIUM BROMIDE 100 MG/10ML IV SOLN
INTRAVENOUS | Status: DC | PRN
  Administered 2016-05-10: 50 mg via INTRAVENOUS

## 2016-05-10 MED ORDER — SODIUM CHLORIDE 0.9 % IR SOLN
Status: DC | PRN
  Administered 2016-05-10: 1000 mL

## 2016-05-10 MED ORDER — PHENYLEPHRINE HCL 10 MG/ML IJ SOLN
INTRAMUSCULAR | Status: AC
  Filled 2016-05-10: qty 2

## 2016-05-10 MED ORDER — ONDANSETRON HCL 4 MG PO TABS
4.0000 mg | ORAL_TABLET | Freq: Four times a day (QID) | ORAL | Status: DC | PRN
  Administered 2016-05-11: 4 mg via ORAL
  Filled 2016-05-10: qty 1

## 2016-05-10 MED ORDER — DOCUSATE SODIUM 100 MG PO CAPS
100.0000 mg | ORAL_CAPSULE | Freq: Two times a day (BID) | ORAL | Status: DC
  Administered 2016-05-10 – 2016-05-14 (×8): 100 mg via ORAL
  Filled 2016-05-10 (×8): qty 1

## 2016-05-10 MED ORDER — ENOXAPARIN SODIUM 30 MG/0.3ML ~~LOC~~ SOLN: 30.0000 mg | SUBCUTANEOUS | 0 refills | Status: DC

## 2016-05-10 MED ORDER — ONDANSETRON HCL 4 MG/2ML IJ SOLN
4.0000 mg | Freq: Four times a day (QID) | INTRAMUSCULAR | Status: DC | PRN
  Administered 2016-05-10: 4 mg via INTRAVENOUS
  Filled 2016-05-10: qty 2

## 2016-05-10 MED ORDER — SENNA-DOCUSATE SODIUM 8.6-50 MG PO TABS: 2.0000 | ORAL_TABLET | Freq: Every day | ORAL | 1 refills | Status: DC

## 2016-05-10 MED ORDER — PHENYLEPHRINE 40 MCG/ML (10ML) SYRINGE FOR IV PUSH (FOR BLOOD PRESSURE SUPPORT)
PREFILLED_SYRINGE | INTRAVENOUS | Status: AC
  Filled 2016-05-10: qty 10

## 2016-05-10 MED ORDER — EPHEDRINE 5 MG/ML INJ
INTRAVENOUS | Status: AC
  Filled 2016-05-10: qty 10

## 2016-05-10 MED ORDER — SODIUM CHLORIDE 0.9 % IV SOLN
75.0000 mL/h | INTRAVENOUS | Status: DC
  Administered 2016-05-10 – 2016-05-13 (×4): 75 mL/h via INTRAVENOUS

## 2016-05-10 MED ORDER — INSULIN ASPART 100 UNIT/ML ~~LOC~~ SOLN
0.0000 [IU] | SUBCUTANEOUS | Status: DC
  Administered 2016-05-10: 7 [IU] via SUBCUTANEOUS
  Administered 2016-05-10: 9 [IU] via SUBCUTANEOUS

## 2016-05-10 MED ORDER — HYDROMORPHONE HCL 1 MG/ML IJ SOLN
0.5000 mg | INTRAMUSCULAR | Status: DC | PRN
  Administered 2016-05-10 – 2016-05-14 (×7): 0.5 mg via INTRAVENOUS
  Filled 2016-05-10 (×7): qty 1

## 2016-05-10 MED ORDER — ENOXAPARIN SODIUM 30 MG/0.3ML ~~LOC~~ SOLN
30.0000 mg | SUBCUTANEOUS | Status: DC
  Administered 2016-05-11: 30 mg via SUBCUTANEOUS
  Filled 2016-05-10: qty 0.3

## 2016-05-10 MED ORDER — BUPIVACAINE HCL (PF) 0.25 % IJ SOLN
INTRAMUSCULAR | Status: AC
  Filled 2016-05-10: qty 30

## 2016-05-10 MED ORDER — METOCLOPRAMIDE HCL 5 MG/ML IJ SOLN: 10.0000 mg | Freq: Once | INTRAMUSCULAR | Status: DC | PRN

## 2016-05-10 MED ORDER — PROPOFOL 10 MG/ML IV BOLUS
INTRAVENOUS | Status: DC | PRN
  Administered 2016-05-10: 175 mg via INTRAVENOUS
  Administered 2016-05-10: 25 mg via INTRAVENOUS

## 2016-05-10 MED ORDER — MORPHINE SULFATE (PF) 2 MG/ML IV SOLN: 2.0000 mg | INTRAVENOUS | Status: DC | PRN

## 2016-05-10 MED ORDER — BACLOFEN 10 MG PO TABS: 10.0000 mg | ORAL_TABLET | Freq: Three times a day (TID) | ORAL | 0 refills | Status: DC

## 2016-05-10 MED ORDER — LACTATED RINGERS IV SOLN
INTRAVENOUS | Status: DC
  Administered 2016-05-10: 13:00:00 via INTRAVENOUS

## 2016-05-10 MED ORDER — FENTANYL CITRATE (PF) 250 MCG/5ML IJ SOLN
INTRAMUSCULAR | Status: AC
Start: 2016-05-10 — End: 2016-05-10
  Filled 2016-05-10: qty 5

## 2016-05-10 MED ORDER — CEFAZOLIN SODIUM-DEXTROSE 2-4 GM/100ML-% IV SOLN
INTRAVENOUS | Status: AC
  Filled 2016-05-10: qty 100

## 2016-05-10 MED ORDER — PHENYLEPHRINE HCL 10 MG/ML IJ SOLN
INTRAMUSCULAR | Status: DC | PRN
  Administered 2016-05-10 (×2): 80 ug via INTRAVENOUS

## 2016-05-10 MED ORDER — OXYCODONE HCL 5 MG PO TABS
ORAL_TABLET | ORAL | Status: AC
  Administered 2016-05-10: 10 mg via ORAL
  Filled 2016-05-10: qty 2

## 2016-05-10 MED ORDER — POLYETHYLENE GLYCOL 3350 17 G PO PACK: 17.0000 g | PACK | Freq: Every day | ORAL | Status: DC | PRN

## 2016-05-10 MED ORDER — MEPERIDINE HCL 25 MG/ML IJ SOLN: 6.2500 mg | INTRAMUSCULAR | Status: DC | PRN

## 2016-05-10 MED ORDER — ONDANSETRON HCL 4 MG/2ML IJ SOLN
4.0000 mg | Freq: Four times a day (QID) | INTRAMUSCULAR | Status: DC | PRN
  Administered 2016-05-11: 4 mg via INTRAVENOUS
  Filled 2016-05-10: qty 2

## 2016-05-10 MED ORDER — 0.9 % SODIUM CHLORIDE (POUR BTL) OPTIME
TOPICAL | Status: DC | PRN
  Administered 2016-05-10: 1000 mL

## 2016-05-10 MED ORDER — MENTHOL 3 MG MT LOZG: 1.0000 | LOZENGE | OROMUCOSAL | Status: DC | PRN

## 2016-05-10 MED ORDER — FERROUS SULFATE 325 (65 FE) MG PO TABS
325.0000 mg | ORAL_TABLET | Freq: Three times a day (TID) | ORAL | Status: DC
  Administered 2016-05-10 – 2016-05-14 (×10): 325 mg via ORAL
  Filled 2016-05-10 (×10): qty 1

## 2016-05-10 MED ORDER — BUPIVACAINE HCL (PF) 0.25 % IJ SOLN
INTRAMUSCULAR | Status: DC | PRN
  Administered 2016-05-10: 20 mL

## 2016-05-10 MED ORDER — LIDOCAINE HCL (CARDIAC) 20 MG/ML IV SOLN
INTRAVENOUS | Status: DC | PRN
  Administered 2016-05-10: 100 mg via INTRAVENOUS

## 2016-05-10 MED ORDER — SUGAMMADEX SODIUM 200 MG/2ML IV SOLN
INTRAVENOUS | Status: DC | PRN
  Administered 2016-05-10: 200 mg via INTRAVENOUS

## 2016-05-10 MED ORDER — INSULIN GLARGINE 100 UNIT/ML ~~LOC~~ SOLN
15.0000 [IU] | Freq: Every day | SUBCUTANEOUS | Status: DC
  Administered 2016-05-10: 15 [IU] via SUBCUTANEOUS
  Filled 2016-05-10: qty 0.15

## 2016-05-10 MED ORDER — INSULIN PUMP
SUBCUTANEOUS | Status: DC
  Administered 2016-05-10: 8.3 via SUBCUTANEOUS
  Administered 2016-05-11 (×2): via SUBCUTANEOUS
  Administered 2016-05-11: 9.3 via SUBCUTANEOUS
  Administered 2016-05-11 (×2): via SUBCUTANEOUS
  Administered 2016-05-11: 4.6 via SUBCUTANEOUS
  Filled 2016-05-10: qty 1

## 2016-05-10 MED ORDER — CEFAZOLIN SODIUM-DEXTROSE 2-4 GM/100ML-% IV SOLN
2.0000 g | Freq: Four times a day (QID) | INTRAVENOUS | Status: AC
  Administered 2016-05-10 – 2016-05-11 (×2): 2 g via INTRAVENOUS
  Filled 2016-05-10 (×2): qty 100

## 2016-05-10 SURGICAL SUPPLY — 61 items
BENZOIN TINCTURE PRP APPL 2/3 (GAUZE/BANDAGES/DRESSINGS) ×2 IMPLANT
BLADE SAW SGTL 18.5X63.X.64 HD (BLADE) ×2 IMPLANT
BRUSH FEMORAL CANAL (MISCELLANEOUS) IMPLANT
CAPT HIP HEMI 2 ×2 IMPLANT
CLSR STERI-STRIP ANTIMIC 1/2X4 (GAUZE/BANDAGES/DRESSINGS) ×2 IMPLANT
COVER BACK TABLE 24X17X13 BIG (DRAPES) IMPLANT
COVER SURGICAL LIGHT HANDLE (MISCELLANEOUS) ×2 IMPLANT
DECANTER SPIKE VIAL GLASS SM (MISCELLANEOUS) ×2 IMPLANT
DRAIN TLS ROUND 10FR (DRAIN) IMPLANT
DRAPE IMP U-DRAPE 54X76 (DRAPES) ×2 IMPLANT
DRAPE INCISE IOBAN 66X45 STRL (DRAPES) ×2 IMPLANT
DRAPE ORTHO SPLIT 77X108 STRL (DRAPES) ×2
DRAPE SURG ORHT 6 SPLT 77X108 (DRAPES) ×2 IMPLANT
DRAPE U-SHAPE 47X51 STRL (DRAPES) ×2 IMPLANT
DRILL BIT 5/64 (BIT) ×2 IMPLANT
DRILL BIT 7/64X5 (BIT) ×2 IMPLANT
DRSG MEPILEX BORDER 4X12 (GAUZE/BANDAGES/DRESSINGS) IMPLANT
DRSG MEPILEX BORDER 4X8 (GAUZE/BANDAGES/DRESSINGS) ×2 IMPLANT
DURAPREP 26ML APPLICATOR (WOUND CARE) ×2 IMPLANT
ELECT BLADE 6.5 EXT (BLADE) IMPLANT
ELECT CAUTERY BLADE 6.4 (BLADE) ×2 IMPLANT
ELECT REM PT RETURN 9FT ADLT (ELECTROSURGICAL) ×2
ELECTRODE REM PT RTRN 9FT ADLT (ELECTROSURGICAL) ×1 IMPLANT
FACESHIELD WRAPAROUND (MASK) ×4 IMPLANT
GLOVE BIOGEL PI ORTHO PRO SZ8 (GLOVE) ×1
GLOVE ORTHO TXT STRL SZ7.5 (GLOVE) ×2 IMPLANT
GLOVE PI ORTHO PRO STRL SZ8 (GLOVE) ×1 IMPLANT
GLOVE SURG ORTHO 8.0 STRL STRW (GLOVE) ×4 IMPLANT
GOWN STRL REUS W/ TWL XL LVL3 (GOWN DISPOSABLE) ×1 IMPLANT
GOWN STRL REUS W/TWL 2XL LVL3 (GOWN DISPOSABLE) ×2 IMPLANT
GOWN STRL REUS W/TWL XL LVL3 (GOWN DISPOSABLE) ×1
HANDPIECE INTERPULSE COAX TIP (DISPOSABLE)
KIT BASIN OR (CUSTOM PROCEDURE TRAY) ×2 IMPLANT
KIT ROOM TURNOVER OR (KITS) ×2 IMPLANT
MANIFOLD NEPTUNE II (INSTRUMENTS) ×2 IMPLANT
NDL SUT 6 .5 CRC .975X.05 MAYO (NEEDLE) IMPLANT
NEEDLE 22X1 1/2 (OR ONLY) (NEEDLE) ×2 IMPLANT
NEEDLE MAYO TAPER (NEEDLE)
NS IRRIG 1000ML POUR BTL (IV SOLUTION) ×2 IMPLANT
PACK TOTAL JOINT (CUSTOM PROCEDURE TRAY) ×2 IMPLANT
PACK UNIVERSAL I (CUSTOM PROCEDURE TRAY) IMPLANT
PAD ARMBOARD 7.5X6 YLW CONV (MISCELLANEOUS) ×4 IMPLANT
PILLOW ABDUCTION HIP (SOFTGOODS) ×2 IMPLANT
PRESSURIZER FEMORAL UNIV (MISCELLANEOUS) IMPLANT
RETRIEVER SUT HEWSON (MISCELLANEOUS) ×2 IMPLANT
SET HNDPC FAN SPRY TIP SCT (DISPOSABLE) IMPLANT
SUT FIBERWIRE #2 38 REV NDL BL (SUTURE) ×4
SUT MNCRL AB 4-0 PS2 18 (SUTURE) ×2 IMPLANT
SUT VIC AB 0 CT1 27 (SUTURE) ×1
SUT VIC AB 0 CT1 27XBRD ANBCTR (SUTURE) ×1 IMPLANT
SUT VIC AB 1 CT1 27 (SUTURE) ×2
SUT VIC AB 1 CT1 27XBRD ANBCTR (SUTURE) ×2 IMPLANT
SUT VIC AB 3-0 SH 8-18 (SUTURE) ×2 IMPLANT
SUTURE FIBERWR#2 38 REV NDL BL (SUTURE) ×2 IMPLANT
SYR CONTROL 10ML LL (SYRINGE) ×2 IMPLANT
TOWEL OR 17X24 6PK STRL BLUE (TOWEL DISPOSABLE) ×2 IMPLANT
TOWEL OR 17X26 10 PK STRL BLUE (TOWEL DISPOSABLE) ×2 IMPLANT
TOWER CARTRIDGE SMART MIX (DISPOSABLE) IMPLANT
TRAY FOLEY W/METER SILVER 16FR (SET/KITS/TRAYS/PACK) ×2 IMPLANT
WATER STERILE IRR 1000ML POUR (IV SOLUTION) IMPLANT
YANKAUER SUCT BULB TIP NO VENT (SUCTIONS) ×2 IMPLANT

## 2016-05-10 NOTE — Anesthesia Procedure Notes (Signed)
Procedure Name: Intubation Date/Time: 05/10/2016 1:06 PM Performed by: Neldon Newport Pre-anesthesia Checklist: Timeout performed, Patient being monitored, Suction available, Emergency Drugs available and Patient identified Patient Re-evaluated:Patient Re-evaluated prior to inductionOxygen Delivery Method: Circle system utilized Intubation Type: IV induction Ventilation: Mask ventilation without difficulty Laryngoscope Size: Mac and 3 Grade View: Grade II Tube type: Oral Tube size: 7.5 mm Number of attempts: 1 Placement Confirmation: breath sounds checked- equal and bilateral,  positive ETCO2 and ETT inserted through vocal cords under direct vision Secured at: 24 cm Tube secured with: Tape Dental Injury: Teeth and Oropharynx as per pre-operative assessment

## 2016-05-10 NOTE — Anesthesia Postprocedure Evaluation (Signed)
Anesthesia Post Note  Patient: Patrick Brown  Procedure(s) Performed: Procedure(s) (LRB): RIGHT HIP HEMIARTHROPLASTY (Right)  Patient location during evaluation: PACU Anesthesia Type: General Level of consciousness: awake Pain management: pain level controlled Vital Signs Assessment: post-procedure vital signs reviewed and stable Respiratory status: spontaneous breathing Cardiovascular status: stable Postop Assessment: no signs of nausea or vomiting Anesthetic complications: no       Last Vitals:  Vitals:   05/10/16 1550 05/10/16 1555  BP: 137/75   Pulse: 93 92  Resp: 14 15  Temp: 36.9 C     Last Pain:  Vitals:   05/10/16 1059  TempSrc:   PainSc: 7                  [employer]

## 2016-05-10 NOTE — ED Notes (Signed)
PT VOMITED LESS THAN A MINUTE AFTER TAKING MEDICATIONS SCANNED BELOW.  UNABLE TO ASSESS HOW MUCH HE HAS RECEIVED.  DENIES CURRENT NAUSEA, SAYS HE "DOESN'T KNOW WHAT HAPPENED, I JUST VOMITED".  [facility] HERE FOR PT TRANSPORT.
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
## 2016-05-10 NOTE — ED Notes (Signed)
CBG 145 AND INSULIN PUMP GOING AT 1.6 U/HR.  SINCE PT IS NPO UNTIL SURGERY I WILL CONTINUE TO MONITOR CBG AND INITIATE PROTOCOLS AS NEEDED FOR HYPOGLYCEMIA.

## 2016-05-10 NOTE — Progress Notes (Signed)
Report called to [facility] in short stay.

## 2016-05-10 NOTE — Op Note (Signed)
05/09/2016 - 05/10/2016  3:11 PM  PATIENT:  Patrick Brown   MRN: [id number]  PRE-OPERATIVE DIAGNOSIS:  Right displaced femoral neck fracture  POST-OPERATIVE DIAGNOSIS:  Same  PROCEDURE:  Procedure(s): RIGHT HIP HEMIARTHROPLASTY  PREOPERATIVE INDICATIONS:  Patrick Brown is an 46 y.o. male who was admitted 05/09/2016 with a diagnosis of Closed right hip fracture, initial encounter [facility]) and elected for surgical management.  The risks benefits and alternatives were discussed with the patient including but not limited to the risks of nonoperative treatment, versus surgical intervention including infection, bleeding, nerve injury, periprosthetic fracture, the need for revision surgery, dislocation, leg length discrepancy, blood clots, cardiopulmonary complications, morbidity, mortality, among others, and they were willing to proceed.  Predicted outcome is good, although there will be at least a six to nine month expected recovery. We discussed the options of percutaneous pinning, versus partial hip replacement versus total hip replacement, and he elected for partial hip replacement, reducing his risk for dislocation, while accelerating recovery compared to pinning. His family member had pinning which failed, and he did not want to go through that process.  OPERATIVE REPORT     SURGEON:  Marchia Bond, MD    ASSISTANT:  Joya Gaskins, OPA-C  (Present throughout the entire procedure,  necessary for completion of procedure in a timely manner, assisting with retraction, instrumentation, and closure)     ANESTHESIA:  General  ESTIMATED BLOOD LOSS: 568 mL    COMPLICATIONS:  None.      COMPONENTS:  Depuy [place] duo fix press fit femur size 6 with a size 51 unipolar head ball with a +0 neck spacer.    PROCEDURE IN DETAIL: The patient was met in the holding area and identified.  The appropriate hip  was marked at the operative site. The patient was then transported to the OR and  placed under general  anesthesia.  At that point, the patient was  placed in the lateral decubitus position with the operative side up and  secured to the operating room table and all bony prominences padded.     The operative lower extremity was prepped from the iliac crest to the toes.  Sterile draping was performed.  Time out was performed prior to incision.      A routine posterolateral approach was utilized via sharp dissection  carried down to the subcutaneous tissue.  Gross bleeders were Bovie  coagulated.  The iliotibial band was identified and incised  along the length of the skin incision.  Self-retaining retractors were  inserted.  With the hip internally rotated, the short external rotators  were identified. The piriformis was tagged with FiberWire, and the hip capsule released in a T-type fashion.  The femoral neck was exposed, and I resected the femoral neck using the appropriate jig. This was performed at approximately a thumb's breadth above the lesser trochanter.    I then exposed the deep acetabulum, cleared out any tissue including the ligamentum teres, and included the hip capsule in the FiberWire used above and below the T.    I then prepared the proximal femur using the cookie-cutter, the lateralizing reamer, and then sequentially broached.  A trial utilized, and initially reduction was extremely difficult, and I felt like I still had too long of the neck. I recessed a size 5 broach, and then used the calcar planer.  I was able to trial with a -2, which felt short.  After removal of the trial, it appeared like the size 5 was not large enough,  and did not have rotational stability, so I went back and used the lateralizing reamer, the medullary reamer, and the 6 broach, and then placed the size 6 stem.  I then trialed with a 0, and it had excellent restoration of length and stability.   I reduced the hip and it was found to have excellent stability with functional range of motion.   I then place  the real implant and I impacted the real head ball into place. The hip was then reduced and taken through functional range of motion and found to have excellent stability. Leg lengths were restored.  I then used a 2 mm drill bits to pass the FiberWire suture from the capsule and piriformis through the greater trochanter, and secured this. Excellent posterior capsular repair was achieved. I also closed the T in the capsule.  I then irrigated the hip copiously again with pulse lavage, and repaired the fascia with Vicryl, followed by Vicryl for the subcutaneous tissue, Monocryl for the skin, Steri-Strips and sterile gauze. The wounds were injected. The patient was then awakened and returned to PACU in stable and satisfactory condition. There were no complications.  Marchia Bond, MD Orthopedic Surgeon [phone]   05/10/2016 3:11 PM

## 2016-05-10 NOTE — ED Notes (Signed)
[facility] is picking [facility].

## 2016-05-10 NOTE — Discharge Instructions (Signed)

## 2016-05-10 NOTE — Anesthesia Preprocedure Evaluation (Addendum)
Anesthesia Evaluation  Patient identified by MRN, date of birth, ID band Patient awake    Reviewed: Allergy & Precautions, NPO status , Patient's Chart, lab work & pertinent test results  Airway Mallampati: II  TM Distance: <3 FB Neck ROM: full    Dental no notable dental hx. (+) Teeth Intact, Dental Advidsory Given   Pulmonary sleep apnea and Continuous Positive Airway Pressure Ventilation , pneumonia, resolved,    Pulmonary exam normal breath sounds clear to auscultation       Cardiovascular negative cardio ROS   Rhythm:Regular Rate:Tachycardia     Neuro/Psych Seizures -, Well Controlled,  PSYCHIATRIC DISORDERS Depression Diabetic retinopathy    GI/Hepatic GERD  Medicated and Controlled,Diabetic gastroparesis   Endo/Other  diabetes, Poorly Controlled, Type 1, Insulin DependentHypothyroidism   Renal/GU CRF and Renal InsufficiencyRenal diseaseDiabetic nephropathy  negative genitourinary   Musculoskeletal  (+) Arthritis ,   Abdominal   Peds  Hematology  (+) anemia , HIV,   Anesthesia Other Findings   Reproductive/Obstetrics                           Anesthesia Physical Anesthesia Plan  ASA: III  Anesthesia Plan: General   Post-op Pain Management:    Induction: Intravenous, Rapid sequence and Cricoid pressure planned  Airway Management Planned: Oral ETT  Additional Equipment:   Intra-op Plan:   Post-operative Plan: Extubation in OR  Informed Consent: I have reviewed the patients History and Physical, chart, labs and discussed the procedure including the risks, benefits and alternatives for the proposed anesthesia with the patient or authorized representative who has indicated his/her understanding and acceptance.   Dental advisory given and Dental Advisory Given  Plan Discussed with: Anesthesiologist, CRNA and Surgeon  Anesthesia Plan Comments:       Anesthesia Quick  Evaluation

## 2016-05-10 NOTE — Consult Note (Signed)
Reason for Consult:Right femoral neck fx Referring Physician: [name] is an 46 y.o. male.  HPI: Patrick Brown was getting out of a truck when he missed a step and fell, landing on his right hip. He had immediate pain and was brought by EMS to [facility] where w/u showed a right femoral neck fx. He was transferred to [facility] for orthopedic consultation and ultimate fixation. He has several comorbidiites including HIV, CKD, and type 1 DM.  Past Medical History:  Diagnosis Date  . Anemia, iron deficiency On procrit  . CAP (community acquired pneumonia)   . CKD (chronic kidney disease)   . CKD (chronic kidney disease) stage 3, GFR 30-59 ml/min   . Degenerative arthritis   . Depression   . Dyslipidemia   . Gastroesophageal reflux disease   . Gastroparesis diabeticorum (HCC)   . Hematuria, microscopic 10/09   work up negative (Dr. Logan Bores)  . HIV positive (HCC)   . 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 Hypothyroidism   . IDDM (insulin dependent diabetes mellitus) (HCC)    38 years  . Low HDL (under 40)   . Proteinuria   . Retinopathy    x2  . SIRS (systemic inflammatory response syndrome) (HCC)     Past Surgical History:  Procedure Laterality Date  . CATARACT EXTRACTION Right   . COLONOSCOPY    . ESOPHAGOGASTRODUODENOSCOPY    . EYE SURGERY  2006,2001   x2   . insulin pump    . LASIK Bilateral   . VIDEO BRONCHOSCOPY Bilateral 12/15/2012   Procedure: VIDEO BRONCHOSCOPY WITH FLUORO;  Surgeon: Barbaraann Share, MD;  Location: [facility];  Service: Cardiopulmonary;  Laterality: Bilateral;  . VITRECTOMY  bilateral    Family History  Problem Relation Age of Onset  . Diabetes type I Brother   . Prostate cancer Other   . Dementia Other   . Dementia  Other   . Dementia Other     Social History:  reports that he has never smoked. He has never used smokeless tobacco. He reports that he does not drink alcohol or use drugs.  Allergies:  Allergies  Allergen Reactions  . Sulfa Antibiotics Other (See Comments)    High potassium  . Ramipril Cough  . Versed [Midazolam] Other (See Comments)    "I don't wake up very good or clear it out of my system"    Medications: I have reviewed the patient's current medications.  Results for orders placed or performed during the hospital encounter of 05/09/16 (from the past 48 hour(s))  Comprehensive metabolic panel     Status: Abnormal   Collection Time: 05/09/16  8:20 PM  Result Value Ref Range   Sodium 139 135 - 145 mmol/L   Potassium 4.0 3.5 - 5.1 mmol/L   Chloride 105 101 - 111 mmol/L   CO2 28 22 - 32 mmol/L   Glucose, Bld 146 (H) 65 - 99 mg/dL   BUN 38 (H) 6 - 20 mg/dL   Creatinine, Ser 9.68 (H) 0.61 - 1.24 mg/dL   Calcium 9.5 8.9 - 91.0 mg/dL   Total Protein 7.9 6.5 - 8.1 g/dL   Albumin 4.0 3.5 - 5.0 g/dL   AST 37 15 - 41 U/L   ALT 45 17 - 63 U/L   Alkaline Phosphatase  104 38 - 126 U/L   Total Bilirubin 0.3 0.3 - 1.2 mg/dL   GFR calc non Af Amer 39 (L) >60 mL/min   GFR calc Af Amer 45 (L) >60 mL/min    Comment: (NOTE) The eGFR has been calculated using the CKD EPI equation. This calculation has not been validated in all clinical situations. eGFR's persistently <60 mL/min signify possible Chronic Kidney Disease.    Anion gap 6 5 - 15  CBC with Differential     Status: Abnormal   Collection Time: 05/09/16  8:20 PM  Result Value Ref Range   WBC 10.6 (H) 4.0 - 10.5 K/uL   RBC 4.23 4.22 - 5.81 MIL/uL   Hemoglobin 14.1 13.0 - 17.0 g/dL   HCT 39.2 39.0 - 52.0 %   MCV 92.7 78.0 - 100.0 fL   MCH 33.3 26.0 - 34.0 pg   MCHC 36.0 30.0 - 36.0 g/dL   RDW 12.7 11.5 - 15.5 %   Platelets 271 150 - 400 K/uL   Neutrophils Relative % 65 %   Neutro Abs 7.0 1.7 - 7.7 K/uL   Lymphocytes  Relative 23 %   Lymphs Abs 2.4 0.7 - 4.0 K/uL   Monocytes Relative 8 %   Monocytes Absolute 0.8 0.1 - 1.0 K/uL   Eosinophils Relative 3 %   Eosinophils Absolute 0.3 0.0 - 0.7 K/uL   Basophils Relative 1 %   Basophils Absolute 0.1 0.0 - 0.1 K/uL  Protime-INR     Status: None   Collection Time: 05/09/16  8:20 PM  Result Value Ref Range   Prothrombin Time 12.5 11.4 - 15.2 seconds   INR 0.93   Type and screen [place]     Status: None   Collection Time: 05/09/16  8:20 PM  Result Value Ref Range   ABO/RH(D) A POS    Antibody Screen NEG    Sample Expiration 05/12/2016   ABO/Rh     Status: None   Collection Time: 05/09/16  8:20 PM  Result Value Ref Range   ABO/RH(D) A POS   CBG monitoring, ED     Status: Abnormal   Collection Time: 05/09/16  8:50 PM  Result Value Ref Range   Glucose-Capillary 148 (H) 65 - 99 mg/dL  CBG monitoring, ED     Status: Abnormal   Collection Time: 05/10/16 12:13 AM  Result Value Ref Range   Glucose-Capillary 145 (H) 65 - 99 mg/dL  Surgical pcr screen     Status: None   Collection Time: 05/10/16  3:13 AM  Result Value Ref Range   MRSA, PCR NEGATIVE NEGATIVE   Staphylococcus aureus NEGATIVE NEGATIVE    Comment:        The Xpert SA Assay (FDA approved for NASAL specimens in patients over 25 years of age), is one component of a comprehensive surveillance program.  Test performance has been validated by [facility] for patients greater than or equal to 18 year old. It is not intended to diagnose infection nor to guide or monitor treatment.   Glucose, capillary     Status: Abnormal   Collection Time: 05/10/16  4:16 AM  Result Value Ref Range   Glucose-Capillary 273 (H) 65 - 99 mg/dL  Type and screen [place]     Status: None   Collection Time: 05/10/16  6:02 AM  Result Value Ref Range   ABO/RH(D) A POS    Antibody Screen NEG    Sample Expiration 05/13/2016  ABO/Rh     Status: None (Preliminary result)    Collection Time: 05/10/16  6:02 AM  Result Value Ref Range   ABO/RH(D) A POS   Glucose, capillary     Status: Abnormal   Collection Time: 05/10/16  7:44 AM  Result Value Ref Range   Glucose-Capillary 368 (H) 65 - 99 mg/dL  CBC WITH DIFFERENTIAL     Status: Abnormal   Collection Time: 05/10/16  7:46 AM  Result Value Ref Range   WBC 10.7 (H) 4.0 - 10.5 K/uL   RBC 3.85 (L) 4.22 - 5.81 MIL/uL   Hemoglobin 12.8 (L) 13.0 - 17.0 g/dL   HCT 36.5 (L) 39.0 - 52.0 %   MCV 94.8 78.0 - 100.0 fL   MCH 33.2 26.0 - 34.0 pg   MCHC 35.1 30.0 - 36.0 g/dL   RDW 12.6 11.5 - 15.5 %   Platelets 206 150 - 400 K/uL   Neutrophils Relative % 62 %   Neutro Abs 6.8 1.7 - 7.7 K/uL   Lymphocytes Relative 24 %   Lymphs Abs 2.5 0.7 - 4.0 K/uL   Monocytes Relative 12 %   Monocytes Absolute 1.3 (H) 0.1 - 1.0 K/uL   Eosinophils Relative 1 %   Eosinophils Absolute 0.1 0.0 - 0.7 K/uL   Basophils Relative 1 %   Basophils Absolute 0.1 0.0 - 0.1 K/uL  Comprehensive metabolic panel     Status: Abnormal   Collection Time: 05/10/16  7:46 AM  Result Value Ref Range   Sodium 135 135 - 145 mmol/L   Potassium 4.4 3.5 - 5.1 mmol/L   Chloride 101 101 - 111 mmol/L   CO2 22 22 - 32 mmol/L   Glucose, Bld 336 (H) 65 - 99 mg/dL   BUN 30 (H) 6 - 20 mg/dL   Creatinine, Ser 1.82 (H) 0.61 - 1.24 mg/dL   Calcium 9.2 8.9 - 10.3 mg/dL   Total Protein 6.5 6.5 - 8.1 g/dL   Albumin 3.3 (L) 3.5 - 5.0 g/dL   AST 26 15 - 41 U/L   ALT 37 17 - 63 U/L   Alkaline Phosphatase 93 38 - 126 U/L   Total Bilirubin 0.7 0.3 - 1.2 mg/dL   GFR calc non Af Amer 43 (L) >60 mL/min   GFR calc Af Amer 49 (L) >60 mL/min    Comment: (NOTE) The eGFR has been calculated using the CKD EPI equation. This calculation has not been validated in all clinical situations. eGFR's persistently <60 mL/min signify possible Chronic Kidney Disease.    Anion gap 12 5 - 15    Dg Chest Port 1 View  Result Date: 05/10/2016 CLINICAL DATA:  Preoperative evaluation  for fixation of femur fracture. HIV disease EXAM: PORTABLE CHEST 1 VIEW COMPARISON:  December 06, 2015 FINDINGS: There is no edema or consolidation. Heart size and pulmonary vascularity are normal. No adenopathy. No bone lesions. IMPRESSION: No edema or consolidation. Electronically Signed   By: Lowella Grip III M.D.   On: 05/10/2016 07:56   Dg Hip Unilat  With Pelvis 2-3 Views Right  Result Date: 05/09/2016 CLINICAL DATA:  46 year old male fell off of vehicle with right hip pain unable to move the leg EXAM: DG HIP (WITH OR WITHOUT PELVIS) 2-3V RIGHT COMPARISON:  None. FINDINGS: The left femoral head projects in joint. The pubic symphysis appears intact. The SI joints are patent. Transcervical right femoral fracture with mild superior displacement of distal fracture fragment. No significant angulation. Right femoral head does  not appear dislocated. IMPRESSION: Slightly displaced transcervical fracture of the proximal right femur Electronically Signed   By: Donavan Foil M.D.   On: 05/09/2016 21:13    Review of Systems  Constitutional: Negative for weight loss.  HENT: Negative for ear discharge, ear pain, hearing loss and tinnitus.   Eyes: Negative for blurred vision, double vision, photophobia and pain.  Respiratory: Negative for cough, sputum production and shortness of breath.   Cardiovascular: Negative for chest pain.  Gastrointestinal: Negative for abdominal pain, nausea and vomiting.  Genitourinary: Negative for dysuria, flank pain, frequency and urgency.  Musculoskeletal: Positive for joint pain (Right hip). Negative for back pain, falls, myalgias and neck pain.  Neurological: Negative for dizziness, tingling, sensory change, focal weakness, loss of consciousness and headaches.  Endo/Heme/Allergies: Does not bruise/bleed easily.  Psychiatric/Behavioral: Negative for depression, memory loss and substance abuse. The patient is not nervous/anxious.    Blood pressure (!) 167/95, pulse (!)  104, temperature 99.7 F (37.6 C), temperature source Oral, resp. rate 16, height '5\' 10"'$  (1.778 m), weight 91.4 kg (201 lb 6.4 oz), SpO2 93 %. Physical Exam  Constitutional: He appears well-developed and well-nourished. No distress.  HENT:  Head: Normocephalic and atraumatic.  Eyes: Conjunctivae are normal. Right eye exhibits no discharge. Left eye exhibits no discharge. No scleral icterus.  Neck: Normal range of motion. Neck supple.  Cardiovascular: Normal rate, regular rhythm and normal heart sounds.  Exam reveals no gallop and no friction rub.   No murmur heard. Respiratory: Effort normal. No respiratory distress. He has no wheezes. He has no rales.  GI: Soft.  Musculoskeletal:  Bilateral shoulder, elbow, wrist, digits- no skin wounds except mild abrasion right elbow, nontender, no instability, no blocks to motion  Sens  Ax/R/M/U intact  Mot   Ax/ R/ PIN/ M/ AIN/ U intact  Rad 2+  RLE No traumatic wounds, ecchymosis, or rash  TTP hip, shortened  No effusions  Knee stable to varus/ valgus and anterior/posterior stress  Sens DPN, SPN, TN intact  Motor EHL, ext, flex, evers 5/5  DP 2+, PT 2+, No significant edema   LLE No traumatic wounds, ecchymosis, or rash  Nontender  No effusions  Knee stable to varus/ valgus and anterior/posterior stress  Sens DPN, SPN, TN intact  Motor EHL, ext, flex, evers 5/5  DP 2+, PT 2+, No significant edema  Lymphadenopathy:    He has no cervical adenopathy.  Skin: He is not diaphoretic.    Assessment/Plan: Fall Right femoral neck fx -- For hemiarthroplasty today by Dr. Mardelle Matte or one of his partners. Continue NPO. He says he's hurting but wanted to forego Buck's traction. Will plan to start PT tomorrow. Multiple medical problems -- per primary service    Lisette Abu, PA-C Orthopedic Surgery [phone] 05/10/2016, 9:33 AM    Seen and agree with above.    The risks benefits and alternatives were discussed with the patient  including but not limited to the risks of nonoperative treatment, versus surgical intervention including infection, bleeding, nerve injury, periprosthetic fracture, the need for revision surgery, dislocation, leg length discrepancy, blood clots, cardiopulmonary complications, morbidity, mortality, among others, and they were willing to proceed.    Johnny Bridge, MD

## 2016-05-10 NOTE — Care Management Note (Signed)
Case Management Note  Patient Details  Name: Patrick Brown MRN: [id number] Date of Birth: Feb 17, 1970  Subjective/Objective:   From home , presents with R hip fracture, for hemi arthroplasty today.                 Action/Plan: NCM will cont to follow patient progression.    Expected Discharge Date:                  Expected Discharge Plan:  [place]  In-House Referral:     Discharge planning Services  CM Consult  Post Acute Care Choice:    Choice offered to:     DME Arranged:    DME Agency:     HH Arranged:    [place] Agency:     Status of Service:  In process, will continue to follow  If discussed at Long Length of Stay Meetings, dates discussed:    Additional Comments:  Zenon Mayo, RN 05/10/2016, 1:13 PM

## 2016-05-10 NOTE — Progress Notes (Signed)
Initial Nutrition Assessment  DOCUMENTATION CODES:   Not applicable  INTERVENTION:   -RD will follow for diet advancement and supplement as appropriate  NUTRITION DIAGNOSIS:   Increased nutrient needs related to  (post-op healing) as evidenced by estimated needs.  GOAL:   Patient will meet greater than or equal to 90% of their needs  MONITOR:   Diet advancement, TF tolerance, Skin, Labs, I & O's  REASON FOR ASSESSMENT:   Consult Hip fracture protocol  ASSESSMENT:   Patrick Brown is a 46 y.o. male with history of diabetes mellitus type 1, chronic kidney disease stage III with history of hyperkalemia, hyperlipidemia, hypothyroidism and HIV had a fall while patient was jumping out of a truck. Denies hitting his head or losing consciousness.   Pt admitted with closed rt hip incision. Orthopedics recommending rt hemiarthroplasty.   Pt down in OR at time of visit. Unable to obtain further nutrition hx or obtain NFPE.   Reviewed wt hx, which reveals wt stability over the past year.   Pt with increased nutrient needs related to post-op healing and hx of HIV. Pt may benefit from addition of nutritional supplements once diet is advanced.   Labs reviewed: CBGS: 285-333.   Diet Order:  Diet NPO time specified Except for: Sips with Meds Diet NPO time specified Except for: Sips with Meds  Skin:  Reviewed, no issues  Last BM:  05/09/16  Height:   Ht Readings from Last 1 Encounters:  05/10/16 5\' 10"  (1.778 m)    Weight:   Wt Readings from Last 1 Encounters:  05/10/16 201 lb 6.4 oz (91.4 kg)    Ideal Body Weight:  75.5 kg  BMI:  Body mass index is 28.9 kg/m.  Estimated Nutritional Needs:   Kcal:  2200-2400  Protein:  115-130 grams  Fluid:  2.2-2.4 L  EDUCATION NEEDS:   No education needs identified at this time  Victoire Deans A. Jimmye Norman, RD, LDN, CDE Pager: [phone] After hours Pager: [phone]

## 2016-05-10 NOTE — Transfer of Care (Signed)
Immediate Anesthesia Transfer of Care Note  Patient: Patrick Brown  Procedure(s) Performed: Procedure(s): RIGHT HIP HEMIARTHROPLASTY (Right)  Patient Location: PACU  Anesthesia Type:General  Level of Consciousness: awake, alert , oriented and patient cooperative  Airway & Oxygen Therapy: Patient Spontanous Breathing and Patient connected to face mask oxygen  Post-op Assessment: Report given to RN, Post -op Vital signs reviewed and stable and Patient moving all extremities  Post vital signs: Reviewed and stable  Last Vitals:  Vitals:   05/10/16 0203 05/10/16 0558  BP: (!) 167/96 (!) 167/95  Pulse: (!) 101 (!) 104  Resp: 17 16  Temp: 37.2 C 37.6 C    Last Pain:  Vitals:   05/10/16 1059  TempSrc:   PainSc: 7          Complications: No apparent anesthesia complications

## 2016-05-11 ENCOUNTER — Inpatient Hospital Stay (HOSPITAL_COMMUNITY): Payer: Worker's Compensation

## 2016-05-11 DIAGNOSIS — S72001A Fracture of unspecified part of neck of right femur, initial encounter for closed fracture: Secondary | ICD-10-CM

## 2016-05-11 DIAGNOSIS — R6510 Systemic inflammatory response syndrome (SIRS) of non-infectious origin without acute organ dysfunction: Secondary | ICD-10-CM

## 2016-05-11 LAB — CBC
HCT: 36.1 % — ABNORMAL LOW (ref 39.0–52.0)
Hemoglobin: 12.2 g/dL — ABNORMAL LOW (ref 13.0–17.0)
MCH: 33.1 pg (ref 26.0–34.0)
MCHC: 33.8 g/dL (ref 30.0–36.0)
MCV: 97.8 fL (ref 78.0–100.0)
PLATELETS: 240 10*3/uL (ref 150–400)
RBC: 3.69 MIL/uL — AB (ref 4.22–5.81)
RDW: 12.7 % (ref 11.5–15.5)
WBC: 17.9 10*3/uL — AB (ref 4.0–10.5)

## 2016-05-11 LAB — URINALYSIS, ROUTINE W REFLEX MICROSCOPIC
Bilirubin Urine: NEGATIVE
GLUCOSE, UA: 50 mg/dL — AB
Ketones, ur: NEGATIVE mg/dL
Leukocytes, UA: NEGATIVE
Nitrite: NEGATIVE
PH: 5 (ref 5.0–8.0)
Protein, ur: 30 mg/dL — AB
SPECIFIC GRAVITY, URINE: 1.014 (ref 1.005–1.030)
SQUAMOUS EPITHELIAL / LPF: NONE SEEN

## 2016-05-11 LAB — GLUCOSE, CAPILLARY
GLUCOSE-CAPILLARY: 174 mg/dL — AB (ref 65–99)
GLUCOSE-CAPILLARY: 210 mg/dL — AB (ref 65–99)
GLUCOSE-CAPILLARY: 222 mg/dL — AB (ref 65–99)
GLUCOSE-CAPILLARY: 248 mg/dL — AB (ref 65–99)
GLUCOSE-CAPILLARY: 280 mg/dL — AB (ref 65–99)
Glucose-Capillary: 178 mg/dL — ABNORMAL HIGH (ref 65–99)
Glucose-Capillary: 221 mg/dL — ABNORMAL HIGH (ref 65–99)
Glucose-Capillary: 252 mg/dL — ABNORMAL HIGH (ref 65–99)

## 2016-05-11 LAB — BASIC METABOLIC PANEL
ANION GAP: 11 (ref 5–15)
BUN: 22 mg/dL — ABNORMAL HIGH (ref 6–20)
CO2: 27 mmol/L (ref 22–32)
Calcium: 8.9 mg/dL (ref 8.9–10.3)
Chloride: 99 mmol/L — ABNORMAL LOW (ref 101–111)
Creatinine, Ser: 1.67 mg/dL — ABNORMAL HIGH (ref 0.61–1.24)
GFR, EST AFRICAN AMERICAN: 55 mL/min — AB (ref >60)
GFR, EST NON AFRICAN AMERICAN: 47 mL/min — AB (ref >60)
Glucose, Bld: 204 mg/dL — ABNORMAL HIGH (ref 65–99)
POTASSIUM: 4.7 mmol/L (ref 3.5–5.1)
SODIUM: 137 mmol/L (ref 135–145)

## 2016-05-11 MED ORDER — ONDANSETRON HCL 4 MG/2ML IJ SOLN
4.0000 mg | INTRAMUSCULAR | Status: DC | PRN
  Administered 2016-05-11 – 2016-05-14 (×4): 4 mg via INTRAVENOUS
  Filled 2016-05-11 (×4): qty 2

## 2016-05-11 MED ORDER — ONDANSETRON HCL 4 MG PO TABS
4.0000 mg | ORAL_TABLET | Freq: Four times a day (QID) | ORAL | Status: DC | PRN
  Administered 2016-05-13: 4 mg via ORAL
  Filled 2016-05-11 (×2): qty 1

## 2016-05-11 MED ORDER — INSULIN PUMP
Freq: Three times a day (TID) | SUBCUTANEOUS | Status: DC
  Administered 2016-05-11: 7.4 via SUBCUTANEOUS
  Administered 2016-05-12: 7 via SUBCUTANEOUS
  Administered 2016-05-12: 8.4 via SUBCUTANEOUS
  Administered 2016-05-13: 4.9 via SUBCUTANEOUS
  Administered 2016-05-13: 5.7 via SUBCUTANEOUS
  Administered 2016-05-14: 8.9 via SUBCUTANEOUS
  Administered 2016-05-14: 1.7 via SUBCUTANEOUS
  Administered 2016-05-14: 8.8 via SUBCUTANEOUS
  Filled 2016-05-11: qty 1

## 2016-05-11 MED ORDER — ENOXAPARIN SODIUM 40 MG/0.4ML ~~LOC~~ SOLN
40.0000 mg | SUBCUTANEOUS | Status: DC
  Administered 2016-05-12 – 2016-05-14 (×3): 40 mg via SUBCUTANEOUS
  Filled 2016-05-11 (×3): qty 0.4

## 2016-05-11 NOTE — Evaluation (Signed)
Occupational Therapy Evaluation Patient Details Name: Patrick Brown MRN: [id number] DOB: [birth year] Today's Date: 05/11/2016    History of Present Illness Pt is a 46 y.o. male s/p R hip hemiarthroplasty. PMH: DM I, Seizures, HIV, OSA.   Clinical Impression   Pt reports she was independent with ADL PTA. Currently pt overall mod assist +2 for stand pivot transfer, min assist for seated UB ADL, and max assist for LB ADL. Educated pt on posterior hip precautions, pt able to recall 3/3 at end of session. Pt appears very anxious regarding movement; needs consistent cues for sequencing and technique. Pt planning to d/c home with 24/7 supervision from his mother. Recommending HHOT for follow up; pending progress may need to consider post acute rehab prior to return home. Pt would benefit from continued skilled OT to address established goals.    Follow Up Recommendations  Home health OT;Supervision/Assistance - 24 hour    Equipment Recommendations  Tub/shower bench;3 in 1 bedside commode    Recommendations for Other Services PT consult     Precautions / Restrictions Precautions Precautions: Posterior Hip;Fall Precaution Booklet Issued: No Precaution Comments: Educated pt on posterior hip precautions, able to recall 3/3 at end of session. Required Braces or Orthoses: Other Brace/Splint Other Brace/Splint: hip abduction pillow Restrictions Weight Bearing Restrictions: Yes RLE Weight Bearing: Weight bearing as tolerated      Mobility Bed Mobility Overal bed mobility: Needs Assistance Bed Mobility: Supine to Sit     Supine to sit: Max assist     General bed mobility comments: Assist for RLE to EOB and trunk elevation to sitting. Cues throughout for sequencing and technique. Pt appears anxious regarding movement and requires increased time and step by step instructions.  Transfers Overall transfer level: Needs assistance Equipment used: Rolling walker (2 wheeled) Transfers: Sit  to/from [facility] Sit to Stand: Mod assist;+2 physical assistance Stand pivot transfers: Min assist;+2 physical assistance       General transfer comment: Cues throughout for sequencing and technique.    Balance Overall balance assessment: Needs assistance Sitting-balance support: Feet supported;Bilateral upper extremity supported Sitting balance-Leahy Scale: Fair     Standing balance support: Bilateral upper extremity supported Standing balance-Leahy Scale: Poor Standing balance comment: RW for support                           ADL either performed or assessed with clinical judgement   ADL Overall ADL's : Needs assistance/impaired Eating/Feeding: Set up;Sitting   Grooming: Min guard;Sitting   Upper Body Bathing: Minimal assistance;Sitting   Lower Body Bathing: Maximal assistance;Sit to/from stand   Upper Body Dressing : Minimal assistance;Sitting   Lower Body Dressing: Maximal assistance;Sit to/from stand   Toilet Transfer: Moderate assistance;+2 for physical assistance;Stand-pivot;RW Toilet Transfer Details (indicate cue type and reason): Simulated by stand pivot to chair         Functional mobility during ADLs: Minimal assistance;+2 for physical assistance;Rolling walker;Cueing for sequencing       Vision         Perception     Praxis      Pertinent Vitals/Pain Pain Assessment: Faces Faces Pain Scale: Hurts whole lot Pain Location: R hip Pain Descriptors / Indicators: Grimacing;Guarding Pain Intervention(s): Limited activity within patient's tolerance;Monitored during session;Repositioned;Ice applied     Hand Dominance     Extremity/Trunk Assessment Upper Extremity Assessment Upper Extremity Assessment: Overall WFL for tasks assessed   Lower Extremity Assessment Lower Extremity Assessment: Defer to  PT evaluation   Cervical / Trunk Assessment Cervical / Trunk Assessment: Normal   Communication  Communication Communication: No difficulties   Cognition Arousal/Alertness: Awake/alert Behavior During Therapy: Anxious Overall Cognitive Status: Within Functional Limits for tasks assessed                                     General Comments       Exercises     Shoulder Instructions      Home Living Family/patient expects to be discharged to:: Private residence Living Arrangements: Parent Available Help at Discharge: Family;Available 24 hours/day Type of Home: House Home Access: Stairs to enter [employer] of Steps: 2 Entrance Stairs-Rails: None Home Layout: One level     Bathroom Shower/Tub: Corporate investment banker: Standard     Home Equipment: None          Prior Functioning/Environment Level of Independence: Independent                 OT Problem List: Decreased strength;Decreased range of motion;Decreased activity tolerance;Impaired balance (sitting and/or standing);Decreased knowledge of use of DME or AE;Decreased knowledge of precautions;Pain      OT Treatment/Interventions: Self-care/ADL training;Energy conservation;DME and/or AE instruction;Therapeutic activities;Patient/family education;Balance training    OT Goals(Current goals can be found in the care plan section) Acute Rehab OT Goals Patient Stated Goal: return home OT Goal Formulation: With patient Time For Goal Achievement: 05/25/16 Potential to Achieve Goals: Good ADL Goals Pt Will Perform Lower Body Bathing: with min guard assist;with adaptive equipment;sit to/from stand Pt Will Perform Lower Body Dressing: with min guard assist;with adaptive equipment;sit to/from stand Pt Will Transfer to Toilet: with min guard assist;ambulating;bedside commode (over toilet) Pt Will Perform Toileting - Clothing Manipulation and hygiene: with min guard assist;sit to/from stand Pt Will Perform Tub/Shower Transfer: with min guard assist;ambulating;tub  bench;rolling walker;Tub transfer  OT Frequency: Min 2X/week   Barriers to D/C:            Co-evaluation PT/OT/SLP Co-Evaluation/Treatment: Yes (dovetail for transfer) Reason for Co-Treatment: For patient/therapist safety;To address functional/ADL transfers PT goals addressed during session: Mobility/safety with mobility OT goals addressed during session: ADL's and self-care      End of Session Equipment Utilized During Treatment: Gait belt;Other (comment);Rolling walker (hip abduction pillow)  Activity Tolerance: Patient tolerated treatment well Patient left: in chair;with call bell/phone within reach;Other (comment);with family/visitor present (with PT)  OT Visit Diagnosis: Other abnormalities of gait and mobility (R26.89);Pain Pain - Right/Left: Right Pain - part of body: Hip                Time: 6160-7371 OT Time Calculation (min): 31 min Charges:  OT General Charges $OT Visit: 1 Procedure OT Evaluation $OT Eval Moderate Complexity: 1 Procedure OT Treatments $Self Care/Home Management : 8-22 mins G-Codes:     Mel Almond A. Ulice Brilliant, M.S., OTR/L Pager: [place] 05/11/2016, 11:27 AM

## 2016-05-11 NOTE — Progress Notes (Addendum)
@   0417 Pt noted with temp--102.6 PRN Tylenol given and encouraged incentive spirometer. Text page Dr. Ralene Muskrat with no orders. Rechecked temp--99.6.

## 2016-05-11 NOTE — Progress Notes (Signed)
   Assessment: 1 Day Post-Op  S/P Procedure(s) (LRB): RIGHT HIP HEMIARTHROPLASTY (Right) by Dr. Mardelle Matte on 05/10/16  Principal Problem:   Closed right hip fracture, initial encounter [facility]) Active Problems:   Type 1 diabetes mellitus ([place])   Acquired hypothyroidism   Convulsions/seizures (HCC)   HIV disease ([place])   OSA (obstructive sleep apnea)   Hip fracture (HCC) Acute Blood Loss Anemia, mild, asymptomatic.  Afebrile.  Elevated temp over night improved with incentive spirometry / tylenol.   Tachycardic.  Improved some this morning 104 bpm during eval.  Pain likely contributing - asking for medicine.  Plan: Advance diet Incentive spirometry Up with therapy - Patient hopes to d/c home in care of his mother who lives with him.  Weight Bearing: Weight Bearing as Tolerated (WBAT) - Posterior Hip precautions reviewed. Dressings: Dry dressing PRN.  Dispo: Per primary team.  PT evaluation pending.  Patient hopes to d/c home.  Subjective: Patient reports pain as moderate. Pain controlled with IV and PO meds.  Tolerating diet.  Urinating.  +Flatus.  No CP, SOB.  Not yet OOB.  Objective:   VITALS:   Vitals:   05/11/16 0231 05/11/16 0239 05/11/16 0412 05/11/16 0600  BP: (!) 184/96 (!) 165/93 134/77   Pulse: (!) 127 (!) 109 (!) 125   Resp: 15  18   Temp: 98.6 F (37 C)  (!) 102.6 F (39.2 C) 99.6 F (37.6 C)  TempSrc: Oral  Oral Oral  SpO2: 98%  97%   Weight:      Height:       CBC Latest Ref Rng & Units 05/11/2016 05/10/2016 05/09/2016  WBC 4.0 - 10.5 K/uL 17.9(H) 10.7(H) 10.6(H)  Hemoglobin 13.0 - 17.0 g/dL 12.2(L) 12.8(L) 14.1  Hematocrit 39.0 - 52.0 % 36.1(L) 36.5(L) 39.2  Platelets 150 - 400 K/uL 240 206 271   BMP Latest Ref Rng & Units 05/11/2016 05/10/2016 05/09/2016  Glucose 65 - 99 mg/dL 204(H) 336(H) 146(H)  BUN 6 - 20 mg/dL 22(H) 30(H) 38(H)  Creatinine 0.61 - 1.24 mg/dL 1.67(H) 1.82(H) 1.96(H)  BUN/Creat Ratio 9 - 20 - - -  Sodium 135 - 145 mmol/L 137 135 139  Potassium  3.5 - 5.1 mmol/L 4.7 4.4 4.0  Chloride 101 - 111 mmol/L 99(L) 101 105  CO2 22 - 32 mmol/L 27 22 28   Calcium 8.9 - 10.3 mg/dL 8.9 9.2 9.5   Intake/Output      04/06 0701 - 04/07 0700 04/07 0701 - 04/08 0700   P.O. 240 120   I.V. (mL/kg) 2526.3 (27.6)    IV Piggyback 55    Total Intake(mL/kg) 2821.3 (30.9) 120 (1.3)   Urine (mL/kg/hr) 1475 (0.7) 250 (0.9)   Blood 450 (0.2)    Total Output 1925 250   Net +896.3 -130          Physical Exam: General: NAD.  Upright in bed.  Calm, conversant. Resp: No increased wob Cardio: High normal rate.  Regular rhythm ABD soft Neurologically intact MSK Neurovascularly intact Sensation intact distally Feet warm. Dorsiflexion/Plantar flexion intact Incision: dressing C/D/I   Prudencio Burly III, PA-C 05/11/2016, 10:00 AM

## 2016-05-11 NOTE — Evaluation (Signed)
Physical Therapy Evaluation Patient Details Name: Patrick Brown MRN: [id number] DOB: [birth year] Today's Date: 05/11/2016   History of Present Illness  Pt is a 46 y.o. male s/p R hip hemiarthroplasty. PMH: DM I, Seizures, HIV, OSA.  Clinical Impression  Pt is POD 1 following the above procedure. Prior to admission, pt was becoming more independent and getting ready to return to work. Pt ow presents the below deficits and requires Mod to Max A for a majority of mobility this session. Pt requires cues for sequencing of movements this session possibly due to anxiety related to mobility. Pt will benefit from continued acute PT services to address the below deficits prior to discharge home.     Follow Up Recommendations Home health PT;Supervision/Assistance - 24 hour    Equipment Recommendations  Rolling walker with 5" wheels;3in1 (PT)    Recommendations for Other Services       Precautions / Restrictions Precautions Precautions: Posterior Hip;Fall Precaution Booklet Issued: No Precaution Comments: Educated pt on posterior hip precautions, able to recall 3/3 at end of session. Required Braces or Orthoses: Other Brace/Splint Other Brace/Splint: hip abduction pillow Restrictions Weight Bearing Restrictions: Yes RLE Weight Bearing: Weight bearing as tolerated      Mobility  Bed Mobility Overal bed mobility: Needs Assistance Bed Mobility: Supine to Sit     Supine to sit: Max assist     General bed mobility comments: Sitting EOB with OT when PT arrives  Transfers Overall transfer level: Needs assistance Equipment used: Rolling walker (2 wheeled) Transfers: Sit to/from [facility] Sit to Stand: Mod assist;+2 physical assistance Stand pivot transfers: Min assist;+2 physical assistance       General transfer comment: Cues throughout for sequencing and technique.  Ambulation/Gait Ambulation/Gait assistance: Min assist Ambulation Distance (Feet): 5  Feet Assistive device: Rolling walker (2 wheeled) Gait Pattern/deviations: Step-to pattern;Decreased step length - right;Decreased step length - left Gait velocity: decreased Gait velocity interpretation: Below normal speed for age/gender General Gait Details: Step by step cues for sequencing throughout.   Stairs            Wheelchair Mobility    Modified Rankin (Stroke Patients Only)       Balance Overall balance assessment: Needs assistance Sitting-balance support: Feet supported;Bilateral upper extremity supported Sitting balance-Leahy Scale: Fair     Standing balance support: Bilateral upper extremity supported Standing balance-Leahy Scale: Poor Standing balance comment: RW for support                             Pertinent Vitals/Pain Pain Assessment: Faces Faces Pain Scale: Hurts whole lot Pain Location: R hip Pain Descriptors / Indicators: Grimacing;Guarding Pain Intervention(s): Limited activity within patient's tolerance;Monitored during session;Repositioned;Ice applied    Home Living Family/patient expects to be discharged to:: Private residence Living Arrangements: Parent Available Help at Discharge: Family;Available 24 hours/day Type of Home: House Home Access: Stairs to enter Entrance Stairs-Rails: None Entrance Stairs-Number of Steps: 2 Home Layout: One level Home Equipment: None      Prior Function Level of Independence: Independent               Hand Dominance   Dominant Hand: Right    Extremity/Trunk Assessment   Upper Extremity Assessment Upper Extremity Assessment: Defer to OT evaluation    Lower Extremity Assessment Lower Extremity Assessment: RLE deficits/detail RLE Deficits / Details: Pt with post op pain and weakness. At least 3/5 ankle and knee, 2/5 hip per  gross functional assessment    Cervical / Trunk Assessment Cervical / Trunk Assessment: Normal  Communication   Communication: No difficulties   Cognition Arousal/Alertness: Awake/alert Behavior During Therapy: Anxious Overall Cognitive Status: Within Functional Limits for tasks assessed                                        General Comments      Exercises Total Joint Exercises Ankle Circles/Pumps: AROM;Both;20 reps;Supine Quad Sets: AROM;Right;10 reps;Supine Heel Slides: AAROM;Right;10 reps;Supine   Assessment/Plan    PT Assessment Patient needs continued PT services  PT Problem List Decreased strength;Decreased range of motion;Decreased activity tolerance;Decreased balance;Decreased mobility;Decreased knowledge of use of DME;Pain       PT Treatment Interventions DME instruction;Gait training;Stair training;Functional mobility training;Therapeutic activities;Therapeutic exercise;Balance training;Patient/family education    PT Goals (Current goals can be found in the Care Plan section)  Acute Rehab PT Goals Patient Stated Goal: return home PT Goal Formulation: With patient Time For Goal Achievement: 05/18/16 Potential to Achieve Goals: Good    Frequency Min 5X/week   Barriers to discharge        Co-evaluation PT/OT/SLP Co-Evaluation/Treatment: Yes Reason for Co-Treatment: For patient/therapist safety;To address functional/ADL transfers PT goals addressed during session: Mobility/safety with mobility OT goals addressed during session: ADL's and self-care       End of Session Equipment Utilized During Treatment: Gait belt Activity Tolerance: Patient tolerated treatment well Patient left: in chair;with call bell/phone within reach;with family/visitor present Nurse Communication: Mobility status PT Visit Diagnosis: Muscle weakness (generalized) (M62.81);Other abnormalities of gait and mobility (R26.89);Pain Pain - Right/Left: Right Pain - part of body: Hip    Time: 6333-5456 PT Time Calculation (min) (ACUTE ONLY): 25 min   Charges:   PT Evaluation $PT Eval Moderate Complexity: 1  Procedure     PT G Codes:        Patrick Brown PT, DPT  [phone]   Patrick Brown 05/11/2016, 1:29 PM

## 2016-05-11 NOTE — Clinical Social Work Note (Signed)
CSW consulted for SNF placement for STR. PT OT rec home with home health. Pt planning to d/c home with 24/7 supervision from his mother. RNCM made aware. CSW signing off as no further SW need identified. Please reconsult if new SW needs arise.   Oretha Ellis, [place], [place] Work [phone]
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
## 2016-05-11 NOTE — Care Management Note (Signed)
Case Management Note  Patient Details  Name: Ric Rosenberg MRN: [id number] Date of Birth: 03/24/1970  Subjective/Objective:                  RIGHT HIP HEMIARTHROPLASTY Action/Plan: Discharge planning Expected Discharge Date:                  Expected Discharge Plan:  [place]  In-House Referral:     Discharge planning Services  CM Consult  Post Acute Care Choice:  Home Health Choice offered to:  Patient  DME Arranged:  3-N-1, Walker rolling DME Agency:  [place]:  PT [place]:  [facility]  Status of Service:  In process, will continue to follow  If discussed at Long Length of Stay Meetings, dates discussed:    Additional Comments: CM spoke with pt for choice of home health agency and pt chooses [facility].  Cm texted MD to please place face to face and HHPT orders per PT recc.  Referral called to [facility] rep, Tommi Emery who is waiting for orders and face to face to process.  Cm notified [facility] DME rep, Reggie to please deliver the rolling walker and 3n1 to room prior to discharge. CM will follow for additional needs. Dellie Catholic, RN 05/11/2016, 4:08 PM

## 2016-05-12 ENCOUNTER — Encounter (HOSPITAL_COMMUNITY): Payer: Self-pay | Admitting: Infectious Diseases

## 2016-05-12 ENCOUNTER — Inpatient Hospital Stay (HOSPITAL_COMMUNITY): Payer: Worker's Compensation

## 2016-05-12 DIAGNOSIS — S72001D Fracture of unspecified part of neck of right femur, subsequent encounter for closed fracture with routine healing: Secondary | ICD-10-CM

## 2016-05-12 DIAGNOSIS — Z882 Allergy status to sulfonamides status: Secondary | ICD-10-CM

## 2016-05-12 DIAGNOSIS — Z9641 Presence of insulin pump (external) (internal): Secondary | ICD-10-CM

## 2016-05-12 DIAGNOSIS — E1022 Type 1 diabetes mellitus with diabetic chronic kidney disease: Secondary | ICD-10-CM

## 2016-05-12 DIAGNOSIS — Z888 Allergy status to other drugs, medicaments and biological substances status: Secondary | ICD-10-CM

## 2016-05-12 DIAGNOSIS — Z820 Family history of epilepsy and other diseases of the nervous system: Secondary | ICD-10-CM

## 2016-05-12 DIAGNOSIS — Z8042 Family history of malignant neoplasm of prostate: Secondary | ICD-10-CM

## 2016-05-12 DIAGNOSIS — N183 Chronic kidney disease, stage 3 (moderate): Secondary | ICD-10-CM

## 2016-05-12 DIAGNOSIS — Z967 Presence of other bone and tendon implants: Secondary | ICD-10-CM

## 2016-05-12 DIAGNOSIS — R5082 Postprocedural fever: Secondary | ICD-10-CM

## 2016-05-12 DIAGNOSIS — Z833 Family history of diabetes mellitus: Secondary | ICD-10-CM

## 2016-05-12 DIAGNOSIS — W19XXXD Unspecified fall, subsequent encounter: Secondary | ICD-10-CM

## 2016-05-12 DIAGNOSIS — Z21 Asymptomatic human immunodeficiency virus [HIV] infection status: Secondary | ICD-10-CM

## 2016-05-12 DIAGNOSIS — Z96641 Presence of right artificial hip joint: Secondary | ICD-10-CM

## 2016-05-12 DIAGNOSIS — Z8781 Personal history of (healed) traumatic fracture: Secondary | ICD-10-CM

## 2016-05-12 LAB — BASIC METABOLIC PANEL
Anion gap: 8 (ref 5–15)
BUN: 24 mg/dL — AB (ref 6–20)
CALCIUM: 8.4 mg/dL — AB (ref 8.9–10.3)
CO2: 25 mmol/L (ref 22–32)
Chloride: 98 mmol/L — ABNORMAL LOW (ref 101–111)
Creatinine, Ser: 1.93 mg/dL — ABNORMAL HIGH (ref 0.61–1.24)
GFR calc Af Amer: 46 mL/min — ABNORMAL LOW (ref >60)
GFR, EST NON AFRICAN AMERICAN: 40 mL/min — AB (ref >60)
Glucose, Bld: 203 mg/dL — ABNORMAL HIGH (ref 65–99)
Potassium: 4.2 mmol/L (ref 3.5–5.1)
Sodium: 131 mmol/L — ABNORMAL LOW (ref 135–145)

## 2016-05-12 LAB — CBC
HCT: 31.7 % — ABNORMAL LOW (ref 39.0–52.0)
Hemoglobin: 10.7 g/dL — ABNORMAL LOW (ref 13.0–17.0)
MCH: 32.8 pg (ref 26.0–34.0)
MCHC: 33.8 g/dL (ref 30.0–36.0)
MCV: 97.2 fL (ref 78.0–100.0)
PLATELETS: 180 10*3/uL (ref 150–400)
RBC: 3.26 MIL/uL — ABNORMAL LOW (ref 4.22–5.81)
RDW: 12.6 % (ref 11.5–15.5)
WBC: 16.1 10*3/uL — ABNORMAL HIGH (ref 4.0–10.5)

## 2016-05-12 LAB — GLUCOSE, CAPILLARY
Glucose-Capillary: 194 mg/dL — ABNORMAL HIGH (ref 65–99)
Glucose-Capillary: 210 mg/dL — ABNORMAL HIGH (ref 65–99)
Glucose-Capillary: 267 mg/dL — ABNORMAL HIGH (ref 65–99)
Glucose-Capillary: 168 mg/dL — ABNORMAL HIGH (ref 65–99)
Glucose-Capillary: 250 mg/dL — ABNORMAL HIGH (ref 65–99)

## 2016-05-12 LAB — LIPASE, BLOOD: Lipase: <10 U/L — ABNORMAL LOW (ref 11–51)

## 2016-05-12 NOTE — Progress Notes (Signed)
Occupational Therapy Treatment Patient Details Name: Patrick Brown MRN: [id number] DOB: 1970/04/07 Today's Date: 05/12/2016    History of present illness Pt is a 46 y.o. male s/p R hip hemiarthroplasty. PMH: DM I, Seizures, HIV, OSA.   OT comments  Pt. See for co tx with PT to facilitate with increasing safety and independence with functional mobility and ADL completion.  Still moving very slow during ambulation and requires max cueing for all steps and rw management (see PT notes for further details).  Max a x2 for bed mobility. Reports family assistance at home. Will need to consider SNF if family unable to manage him at current level of functional mobility.    Follow Up Recommendations  Home health OT;Supervision/Assistance - 24 hour    Equipment Recommendations  Tub/shower bench;3 in 1 bedside commode    Recommendations for Other Services      Precautions / Restrictions Precautions Precautions: Posterior Hip;Fall Precaution Comments: Educated pt on posterior hip precautions, able to recall 3/3 at end of session. Other Brace/Splint: hip abduction pillow Restrictions RLE Weight Bearing: Weight bearing as tolerated       Mobility Bed Mobility Overal bed mobility: Needs Assistance Bed Mobility: Supine to Sit     Supine to sit: +2 for physical assistance     General bed mobility comments: PT assisted with trunk and I guided B LEs oob.  pt. attempted initally to transition into long sitting and fell back on the bed saying "i cant i just cant im so weak".  states mother lives with him and can assist with bed mobility as needed.  (hob flat no rails, exist on R side)  Transfers Overall transfer level: Needs assistance Equipment used: Rolling walker (2 wheeled) Transfers: Sit to/from [facility] Sit to Stand: Min assist;+2 physical assistance Stand pivot transfers: Min assist;+2 physical assistance       General transfer comment: Cues throughout for sequencing  and technique. LOB x2 post. while amb. over ledge into b.room doorway    Balance                                           ADL either performed or assessed with clinical judgement   ADL Overall ADL's : Needs assistance/impaired                         Toilet Transfer: Minimal assistance;+2 for physical assistance;Ambulation;RW;BSC;Regular Glass blower/designer Details (indicate cue type and reason): 3n1 over the commode Toileting- Clothing Manipulation and Hygiene: Sitting/lateral lean;Set up       Functional mobility during ADLs: Minimal assistance;+2 for physical assistance;Rolling walker;Cueing for sequencing General ADL Comments: increased time for all task completion moves SLOW!!! reviewed use of urinal at night as pt. reports b.room is down the hall from b.room.  states he is on lasix and has to urinate frequently and would not be able to amb. to/from b.room at night     Vision       Perception     Praxis      Cognition Arousal/Alertness: Awake/alert Behavior During Therapy: Anxious Overall Cognitive Status: Within Functional Limits for tasks assessed  Exercises     Shoulder Instructions       General Comments      Pertinent Vitals/ Pain       Pain Assessment: No/denies pain Pain Intervention(s): Monitored during session;Premedicated before session  Home Living                                          Prior Functioning/Environment              Frequency  Min 2X/week        Progress Toward Goals  OT Goals(current goals can now be found in the care plan section)  Progress towards OT goals: Progressing toward goals     Plan Discharge plan remains appropriate    Co-evaluation      Reason for Co-Treatment: For patient/therapist safety;To address functional/ADL transfers          End of Session Equipment Utilized During Treatment:  Gait belt;Rolling walker  OT Visit Diagnosis: Other abnormalities of gait and mobility (R26.89);Pain Pain - Right/Left: Right Pain - part of body: Hip   Activity Tolerance Patient tolerated treatment well   Patient Left in chair;with call bell/phone within reach;Other (comment) (PT in room finishing tx session with LB exercises)   Nurse Communication          Time: 8295-6213 OT Time Calculation (min): 29 min  Charges: OT General Charges $OT Visit: 1 Procedure OT Treatments $Self Care/Home Management : 8-22 mins   Janice Coffin, COTA/L 05/12/2016, 10:50 AM

## 2016-05-12 NOTE — Consult Note (Signed)
[place] for Infectious Disease  Date of Admission:  05/09/2016  Date of Consult:  05/12/2016  Reason for Consult:post-op fever, HV Referring Physician: Dhungel  Impression/Recommendation Post-op fever CXR (-), UA only notable for protein.  Would consider lower extremity dopplers (esp R).  He has a RUE peripheral IV that is clean, no cordis. Would consider flat and upright of abd to r/o ileus (seems less likely with him having flatus), check lipase Imaging his hip would be low yield- he likely has edema already and an infection appearing this rapidly after surgery would be very unusual.  Lastly could consider drug reaction (to ancef or to anesthetic).  Would NOT start antibiotics at this point- he is not toxic and has no localizing s/s.    HIV HIV 1 RNA Quant (copies/mL)  Date Value  03/26/2016 <20 NOT DETECTED  09/18/2015 <20  02/09/2015 <20   CD4 T Cell Abs (/uL)  Date Value  03/26/2016 310 (L)  09/19/2015 380 (L)  02/09/2015 360 (L)  continue his current ART.   CKD 3 Cr has been stable (40-50)  Thank you so much for your excellent care of this pt and this interesting consult,   Bobby Rumpf (pager) [phone] [email or web address]  Patrick Brown is an 46 y.o. male.  HPI: 46 yo M with HIV (dx 05-2012, on DTGV/epz), CKD3, IDDM, adm on 4-5 with fall and found to have R hip fracture. He was afebrile on adm.  He underwent partial hip replacement/R hemiarthroplasty on 4-6. He received ancef perioperatively.  By the AM of 4-7 he developed temp to 102.6 with leukocytosis (17.9 ).  His temp decreased to 99.5 but then increased to 102.4 again today.   Past Medical History:  Diagnosis Date  . Anemia, iron deficiency On procrit  . CAP (community acquired pneumonia)   . CKD (chronic kidney disease)   . CKD (chronic kidney disease) stage 3, GFR 30-59 ml/min   . Degenerative arthritis   . Depression   . Dyslipidemia   . Gastroesophageal reflux disease   .  Gastroparesis diabeticorum ([place])   . Hematuria, microscopic 10/09   work up negative (Dr. Amalia Hailey)  . HIV positive ([place])   . 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 Hypothyroidism   . IDDM (insulin dependent diabetes mellitus) ([place])    38 years  . Low HDL (under 40)   . Proteinuria   . Retinopathy    x2  . SIRS (systemic inflammatory response syndrome) ([facility])     Past Surgical History:  Procedure Laterality Date  . CATARACT EXTRACTION Right   . COLONOSCOPY    . ESOPHAGOGASTRODUODENOSCOPY    . EYE SURGERY  2006,2001   x2   . insulin pump    . LASIK Bilateral   . VIDEO BRONCHOSCOPY Bilateral 12/15/2012   Procedure: VIDEO BRONCHOSCOPY WITH FLUORO;  Surgeon: Kathee Delton, MD;  Location: [facility];  Service: Cardiopulmonary;  Laterality: Bilateral;  . VITRECTOMY  bilateral     Allergies  Allergen Reactions  . Sulfa Antibiotics Other (See Comments)    High potassium  . Ramipril Cough  . Versed [Midazolam] Other (See Comments)    "I don't wake up very good or clear it out of my system"    Medications:  Scheduled: . abacavir  600 mg Oral QHS  And  . lamiVUDine  300 mg Oral QHS  . acyclovir  400 mg Oral BID  . docusate sodium  100 mg Oral BID  . dolutegravir  50 mg Oral QHS  . DULoxetine  60 mg Oral QHS  . enoxaparin (LOVENOX) injection  40 mg Subcutaneous Q24H  . febuxostat  40 mg Oral Daily  . ferrous sulfate  325 mg Oral TID PC  . fludrocortisone  0.1 mg Oral Daily  . fluocinonide cream  1 application Topical BID  . fluticasone  2 spray Each Nare Daily  . furosemide  20 mg Oral BID  . insulin pump   Subcutaneous TID AC, HS, 0200  . levothyroxine  175 mcg Oral QAC breakfast  . metoCLOPramide  5 mg Oral BID  . niacin  1,500 mg Oral QHS  .  omega-3 acid ethyl esters  2 g Oral BID  . pantoprazole  40 mg Oral Daily  . senna  1 tablet Oral BID  . simvastatin  40 mg Oral q1800    Abtx:  Anti-infectives    Start     Dose/Rate Route Frequency Ordered Stop   05/11/16 0600  ceFAZolin (ANCEF) IVPB 2g/100 mL premix  Status:  Discontinued     2 g 200 mL/hr over 30 Minutes Intravenous On call to O.R. 05/10/16 1224 05/10/16 1309   05/10/16 1900  ceFAZolin (ANCEF) IVPB 2g/100 mL premix     2 g 200 mL/hr over 30 Minutes Intravenous Every 6 hours 05/10/16 1725 05/11/16 0211   05/10/16 1315  ceFAZolin (ANCEF) IVPB 2g/100 mL premix     2 g 200 mL/hr over 30 Minutes Intravenous On call to O.R. 05/10/16 1224 05/10/16 1308   05/10/16 1229  ceFAZolin (ANCEF) 2-4 GM/100ML-% IVPB    Comments:  Schonewitz, Leigh   : cabinet override      05/10/16 1229 05/10/16 1308   05/10/16 0100  abacavir (ZIAGEN) tablet 600 mg     600 mg Oral Daily at bedtime 05/10/16 0054     05/10/16 0100  lamiVUDine (EPIVIR) tablet 300 mg     300 mg Oral Daily at bedtime 05/10/16 0054     05/09/16 2345  abacavir-lamiVUDine (EPZICOM) 600-300 MG per tablet 1 tablet  Status:  Discontinued     1 tablet Oral Daily at bedtime 05/09/16 2332 05/10/16 0053   05/09/16 2345  dolutegravir (TIVICAY) tablet 50 mg     50 mg Oral Daily at bedtime 05/09/16 2332     05/09/16 2345  acyclovir (ZOVIRAX) tablet 400 mg     400 mg Oral 2 times daily 05/09/16 2332        Total days of antibiotics: 0          Social History:  reports that he has never smoked. He has never used smokeless tobacco. He reports that he does not drink alcohol or use drugs.  Family History  Problem Relation Age of Onset  . Diabetes type I Brother   . Prostate cancer Other   . Dementia Other   . Dementia Other   . Dementia Other     General ROS: decreased apetite, 1 episode n/v prior to surgery (causing him to lose dose of ART, his only missed dose of ART ever per pt), normal faltus, no BM since surgery,  normal urination Please see HPI. 12 point ROS o/w (-)  Blood pressure 116/62, pulse (!) 108, temperature 99.5 F (37.5 C), temperature source Oral, resp. rate 20, height '5\' 10"'$  (1.778 m),  weight 91.4 kg (201 lb 6.4 oz), SpO2 99 %. General appearance: alert, cooperative and no distress Eyes: negative findings: conjunctivae and sclerae normal and pupils equal, round, reactive to light and accomodation Throat: lips, mucosa, and tongue normal; teeth and gums normal Neck: no adenopathy and supple, symmetrical, trachea midline Lungs: clear to auscultation bilaterally Abdomen: normal findings: bowel sounds normal and soft, non-tender and abnormal findings:  mild distension.  Extremities: edema none and mild calf tenderness on R.  Skin: R hip wound is warm, dressed, clean.    Results for orders placed or performed during the hospital encounter of 05/09/16 (from the past 48 hour(s))  Glucose, capillary     Status: Abnormal   Collection Time: 05/10/16  8:09 PM  Result Value Ref Range   Glucose-Capillary 350 (H) 65 - 99 mg/dL  Glucose, capillary     Status: Abnormal   Collection Time: 05/11/16 12:27 AM  Result Value Ref Range   Glucose-Capillary 280 (H) 65 - 99 mg/dL  Glucose, capillary     Status: Abnormal   Collection Time: 05/11/16  4:01 AM  Result Value Ref Range   Glucose-Capillary 222 (H) 65 - 99 mg/dL  CBC     Status: Abnormal   Collection Time: 05/11/16  4:44 AM  Result Value Ref Range   WBC 17.9 (H) 4.0 - 10.5 K/uL   RBC 3.69 (L) 4.22 - 5.81 MIL/uL   Hemoglobin 12.2 (L) 13.0 - 17.0 g/dL   HCT 36.1 (L) 39.0 - 52.0 %   MCV 97.8 78.0 - 100.0 fL   MCH 33.1 26.0 - 34.0 pg   MCHC 33.8 30.0 - 36.0 g/dL   RDW 12.7 11.5 - 15.5 %   Platelets 240 150 - 400 K/uL  Basic metabolic panel     Status: Abnormal   Collection Time: 05/11/16  4:44 AM  Result Value Ref Range   Sodium 137 135 - 145 mmol/L   Potassium 4.7 3.5 - 5.1 mmol/L   Chloride 99 (L) 101 - 111 mmol/L   CO2 27 22 - 32 mmol/L    Glucose, Bld 204 (H) 65 - 99 mg/dL   BUN 22 (H) 6 - 20 mg/dL   Creatinine, Ser 1.67 (H) 0.61 - 1.24 mg/dL   Calcium 8.9 8.9 - 10.3 mg/dL   GFR calc non Af Amer 47 (L) >60 mL/min   GFR calc Af Amer 55 (L) >60 mL/min    Comment: (NOTE) The eGFR has been calculated using the CKD EPI equation. This calculation has not been validated in all clinical situations. eGFR's persistently <60 mL/min signify possible Chronic Kidney Disease.    Anion gap 11 5 - 15  Glucose, capillary     Status: Abnormal   Collection Time: 05/11/16  7:39 AM  Result Value Ref Range   Glucose-Capillary 178 (H) 65 - 99 mg/dL   Comment 1 Notify RN   Culture, blood (routine x 2)     Status: None (Preliminary result)   Collection Time: 05/11/16  9:05 AM  Result Value Ref Range   Specimen Description BLOOD LEFT ANTECUBITAL    Special Requests IN PEDIATRIC BOTTLE Blood Culture adequate volume    Culture NO GROWTH 1 DAY    Report Status PENDING   Culture, blood (routine x 2)     Status: None (Preliminary result)   Collection Time: 05/11/16  9:10 AM  Result Value Ref Range   Specimen Description BLOOD LEFT ANTECUBITAL    Special Requests IN PEDIATRIC BOTTLE Blood Culture  adequate volume    Culture NO GROWTH 1 DAY    Report Status PENDING   Urinalysis, Routine w reflex microscopic     Status: Abnormal   Collection Time: 05/11/16 10:45 AM  Result Value Ref Range   Color, Urine YELLOW YELLOW   APPearance CLEAR CLEAR   Specific Gravity, Urine 1.014 1.005 - 1.030   pH 5.0 5.0 - 8.0   Glucose, UA 50 (A) NEGATIVE mg/dL   Hgb urine dipstick MODERATE (A) NEGATIVE   Bilirubin Urine NEGATIVE NEGATIVE   Ketones, ur NEGATIVE NEGATIVE mg/dL   Protein, ur 30 (A) NEGATIVE mg/dL   Nitrite NEGATIVE NEGATIVE   Leukocytes, UA NEGATIVE NEGATIVE   RBC / HPF 0-5 0 - 5 RBC/hpf   WBC, UA 0-5 0 - 5 WBC/hpf   Bacteria, UA RARE (A) NONE SEEN   Squamous Epithelial / LPF NONE SEEN NONE SEEN   Mucous PRESENT   Glucose, capillary      Status: Abnormal   Collection Time: 05/11/16 11:38 AM  Result Value Ref Range   Glucose-Capillary 174 (H) 65 - 99 mg/dL   Comment 1 Notify RN   Glucose, capillary     Status: Abnormal   Collection Time: 05/11/16  1:21 PM  Result Value Ref Range   Glucose-Capillary 210 (H) 65 - 99 mg/dL  Glucose, capillary     Status: Abnormal   Collection Time: 05/11/16  3:46 PM  Result Value Ref Range   Glucose-Capillary 248 (H) 65 - 99 mg/dL   Comment 1 Notify RN   Glucose, capillary     Status: Abnormal   Collection Time: 05/11/16  7:56 PM  Result Value Ref Range   Glucose-Capillary 221 (H) 65 - 99 mg/dL  Glucose, capillary     Status: Abnormal   Collection Time: 05/11/16 10:47 PM  Result Value Ref Range   Glucose-Capillary 252 (H) 65 - 99 mg/dL  Glucose, capillary     Status: Abnormal   Collection Time: 05/12/16  2:56 AM  Result Value Ref Range   Glucose-Capillary 194 (H) 65 - 99 mg/dL  CBC     Status: Abnormal   Collection Time: 05/12/16  3:22 AM  Result Value Ref Range   WBC 16.1 (H) 4.0 - 10.5 K/uL   RBC 3.26 (L) 4.22 - 5.81 MIL/uL   Hemoglobin 10.7 (L) 13.0 - 17.0 g/dL   HCT 91.2 (L) 25.8 - 34.6 %   MCV 97.2 78.0 - 100.0 fL   MCH 32.8 26.0 - 34.0 pg   MCHC 33.8 30.0 - 36.0 g/dL   RDW 21.9 47.1 - 25.2 %   Platelets 180 150 - 400 K/uL  Basic metabolic panel     Status: Abnormal   Collection Time: 05/12/16  3:22 AM  Result Value Ref Range   Sodium 131 (L) 135 - 145 mmol/L   Potassium 4.2 3.5 - 5.1 mmol/L   Chloride 98 (L) 101 - 111 mmol/L   CO2 25 22 - 32 mmol/L   Glucose, Bld 203 (H) 65 - 99 mg/dL   BUN 24 (H) 6 - 20 mg/dL   Creatinine, Ser 7.12 (H) 0.61 - 1.24 mg/dL   Calcium 8.4 (L) 8.9 - 10.3 mg/dL   GFR calc non Af Amer 40 (L) >60 mL/min   GFR calc Af Amer 46 (L) >60 mL/min    Comment: (NOTE) The eGFR has been calculated using the CKD EPI equation. This calculation has not been validated in all clinical situations. eGFR's persistently <60 mL/min signify  possible Chronic  Kidney Disease.    Anion gap 8 5 - 15  Glucose, capillary     Status: Abnormal   Collection Time: 05/12/16  7:41 AM  Result Value Ref Range   Glucose-Capillary 210 (H) 65 - 99 mg/dL   Comment 1 Notify RN   Glucose, capillary     Status: Abnormal   Collection Time: 05/12/16 11:53 AM  Result Value Ref Range   Glucose-Capillary 267 (H) 65 - 99 mg/dL  Glucose, capillary     Status: Abnormal   Collection Time: 05/12/16  4:44 PM  Result Value Ref Range   Glucose-Capillary 168 (H) 65 - 99 mg/dL      Component Value Date/Time   SDES BLOOD LEFT ANTECUBITAL 05/11/2016 0910   SPECREQUEST IN PEDIATRIC BOTTLE Blood Culture adequate volume 05/11/2016 0910   CULT NO GROWTH 1 DAY 05/11/2016 0910   REPTSTATUS PENDING 05/11/2016 0910   Dg Chest Port 1 View  Result Date: 05/11/2016 CLINICAL DATA:  Fever EXAM: PORTABLE CHEST 1 VIEW COMPARISON:  05/10/2016 FINDINGS: Normal heart size and vascularity. Slightly low lung volumes. Lungs remain clear. No focal pneumonia, collapse or consolidation. Negative for edema, effusion or pneumothorax. Trachea is midline. Marked gastric distention noted. IMPRESSION: Persistent low lung volumes without acute chest process. Gastric distention. Electronically Signed   By: Jerilynn Mages.  Shick M.D.   On: 05/11/2016 10:15   Dg Hip Port Unilat With Pelvis 1v Right  Result Date: 05/12/2016 CLINICAL DATA:  Fever.  Recent right hemiarthroplasty. EXAM: DG HIP (WITH OR WITHOUT PELVIS) 1V PORT RIGHT COMPARISON:  May 10, 2016 FINDINGS: The patient is status post right hip replacement. Acetabular and femoral components are in good position. A small amount of subcutaneous air may be due to the surgery 2 days ago. No other acute abnormalities. IMPRESSION: Right hip replacement as above. The small amount of subcutaneous air could be postoperative. Electronically Signed   By: Dorise Bullion III M.D   On: 05/12/2016 16:21   Recent Results (from the past 240 hour(s))  Surgical pcr screen      Status: None   Collection Time: 05/10/16  3:13 AM  Result Value Ref Range Status   MRSA, PCR NEGATIVE NEGATIVE Final   Staphylococcus aureus NEGATIVE NEGATIVE Final    Comment:        The Xpert SA Assay (FDA approved for NASAL specimens in patients over 58 years of age), is one component of a comprehensive surveillance program.  Test performance has been validated by [facility] for patients greater than or equal to 57 year old. It is not intended to diagnose infection nor to guide or monitor treatment.   Culture, blood (routine x 2)     Status: None (Preliminary result)   Collection Time: 05/11/16  9:05 AM  Result Value Ref Range Status   Specimen Description BLOOD LEFT ANTECUBITAL  Final   Special Requests IN PEDIATRIC BOTTLE Blood Culture adequate volume  Final   Culture NO GROWTH 1 DAY  Final   Report Status PENDING  Incomplete  Culture, blood (routine x 2)     Status: None (Preliminary result)   Collection Time: 05/11/16  9:10 AM  Result Value Ref Range Status   Specimen Description BLOOD LEFT ANTECUBITAL  Final   Special Requests IN PEDIATRIC BOTTLE Blood Culture adequate volume  Final   Culture NO GROWTH 1 DAY  Final   Report Status PENDING  Incomplete      05/12/2016, 5:44 PM     LOS:  3 days    Records and images were personally reviewed where available.

## 2016-05-12 NOTE — Progress Notes (Signed)
qPhysical Therapy Treatment Patient Details Name: Patrick Brown MRN: [id number] DOB: 01/03/1971 Today's Date: 05/12/2016    History of Present Illness Pt is a 46 y.o. male s/p R hip hemiarthroplasty. PMH: DM I, Seizures, HIV, OSA.    PT Comments    Pt is POD 2 and is moving better with therapy this session. Pt continues to require assistance for bed mobility and safety with transfers and gait. Pt is unable to get OOB without assistance but reports that his parents are capable of helping him as needed. Pt will benefit from continued transfers, gait and stair negotiation training prior to discharge home. May be beneficial to educate family regarding mobility with precautions.     Follow Up Recommendations  Home health PT;Supervision/Assistance - 24 hour     Equipment Recommendations  Rolling walker with 5" wheels;3in1 (PT)    Recommendations for Other Services       Precautions / Restrictions Precautions Precautions: Posterior Hip;Fall Precaution Booklet Issued: No Precaution Comments: Educated pt on posterior hip precautions, able to recall 3/3 at end of session. Required Braces or Orthoses: Other Brace/Splint Other Brace/Splint: hip abduction pillow Restrictions Weight Bearing Restrictions: Yes RLE Weight Bearing: Weight bearing as tolerated    Mobility  Bed Mobility Overal bed mobility: Needs Assistance Bed Mobility: Supine to Sit     Supine to sit: +2 for physical assistance     General bed mobility comments: PT assisted with trunk and I guided B LEs oob.  pt. attempted initally to transition into long sitting and fell back on the bed saying "i cant i just cant im so weak".  states mother lives with him and can assist with bed mobility as needed.  (hob flat no rails, exist on R side)  Transfers Overall transfer level: Needs assistance Equipment used: Rolling walker (2 wheeled) Transfers: Sit to/from [facility] Sit to Stand: Min assist;+2 physical  assistance Stand pivot transfers: Min assist;+2 physical assistance       General transfer comment: Cues throughout for sequencing and technique. LOB x2 post. while amb. over ledge into b.room doorway  Ambulation/Gait Ambulation/Gait assistance: Min assist Ambulation Distance (Feet): 25 Feet Assistive device: Rolling walker (2 wheeled) Gait Pattern/deviations: Step-to pattern;Decreased step length - right;Decreased step length - left Gait velocity: decreased Gait velocity interpretation: Below normal speed for age/gender General Gait Details: Step by step cues for sequencing throughout.    Stairs            Wheelchair Mobility    Modified Rankin (Stroke Patients Only)       Balance Overall balance assessment: Needs assistance Sitting-balance support: Feet supported;Bilateral upper extremity supported Sitting balance-Leahy Scale: Fair     Standing balance support: Bilateral upper extremity supported Standing balance-Leahy Scale: Poor Standing balance comment: RW for support                            Cognition Arousal/Alertness: Awake/alert Behavior During Therapy: Anxious Overall Cognitive Status: Within Functional Limits for tasks assessed                                        Exercises Total Joint Exercises Short Arc Quad: AROM;Right;10 reps;Supine Hip ABduction/ADduction: AAROM;Right;10 reps;Supine    General Comments        Pertinent Vitals/Pain Pain Assessment: No/denies pain Pain Intervention(s): Monitored during session;Premedicated before session  Home Living                      Prior Function            PT Goals (current goals can now be found in the care plan section) Acute Rehab PT Goals Patient Stated Goal: return home Progress towards PT goals: Progressing toward goals    Frequency    Min 5X/week      PT Plan Current plan remains appropriate    Co-evaluation PT/OT/SLP  Co-Evaluation/Treatment: Yes Reason for Co-Treatment: For patient/therapist safety PT goals addressed during session: Mobility/safety with mobility       End of Session Equipment Utilized During Treatment: Gait belt Activity Tolerance: Patient tolerated treatment well Patient left: in chair;with call bell/phone within reach Nurse Communication: Mobility status PT Visit Diagnosis: Muscle weakness (generalized) (M62.81);Other abnormalities of gait and mobility (R26.89);Pain Pain - Right/Left: Right Pain - part of body: Hip     Time: 1005-1045 PT Time Calculation (min) (ACUTE ONLY): 40 min  Charges:  $Gait Training: 8-22 mins $Therapeutic Exercise: 8-22 mins                    G Codes:       Scheryl Marten PT, DPT  [phone]    Shanon Rosser 05/12/2016, 1:46 PM
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
## 2016-05-12 NOTE — Progress Notes (Signed)
Inpatient Diabetes Program Recommendations  AACE/ADA: New Consensus Statement on Inpatient Glycemic Control (2015)  Target Ranges:  Prepandial:   less than 140 mg/dL      Peak postprandial:   less than 180 mg/dL (1-2 hours)      Critically ill patients:  140 - 180 mg/dL   Lab Results  Component Value Date   GLUCAP 267 (H) 05/12/2016   HGBA1C 6.9 07/22/2014    Review of Glycemic Control  Diabetes history: DM1 Outpatient Diabetes medications: Insulin Pump with U-500 Current orders for Inpatient glycemic control: Insulin Pump with U-500  Spoke with Hinton Dyer, RN, regarding pt's insulin pump. Changed site on Friday and has supplies to change on 4/9.  Blood sugars running in 200s. RN reports pt has a fever. Pt is bolusing and recording on Assessment sheet. Instructed RN to wrench in insulin pump and record her assessment. Have pt sign contract.  BASAL Maximum Basal Rate: 2.00 units/hour 24-Hour Total Insulin: 40.700 units  Current Basal Rates  00:00 1.40 units/hour 08:00 1.80 units/hour 17:00 1.90 units/hour  BOLUS: Insulin to Carb Ratio 00:00 4.0  Sensitivity  00:00 15  BG Target 00:00 140(low) 140(high)  Diabetes Coordinator to see pt in am.  Thank you. [name], RD, LDN, CDE Inpatient Diabetes Coordinator [phone]

## 2016-05-12 NOTE — Progress Notes (Signed)
   Assessment: 2 Days Post-Op  S/P Procedure(s) (LRB): RIGHT HIP HEMIARTHROPLASTY (Right) by Dr. Mardelle Matte on 05/10/16  Principal Problem:   Closed right hip fracture, initial encounter [facility]) Active Problems:   Type 1 diabetes mellitus ([place])   Acquired hypothyroidism   Convulsions/seizures (HCC)   HIV disease ([place])   OSA (obstructive sleep apnea)   Hip fracture (HCC) Acute Blood Loss Anemia, mild, asymptomatic.  Likely w/ dilutional component.  Slow to mobilize, but improving.  Stable from an orthopedic perspective.  Plan: Continue plan per medicine.   Incentive spirometry Up with therapy - Patient hopes to d/c home in care of his mother who lives with him.  Weight Bearing: Weight Bearing as Tolerated (WBAT) - Posterior Hip precautions reviewed. Dressings: Dry dressing PRN.  Dispo: Per primary team.  PT evaluation pending.  Patient hopes to d/c home.  Subjective: Patient reports pain as moderate. Pain controlled with IV and PO meds.  Tolerating diet.  Urinating.  +Flatus.  No CP, SOB.  OOB - short distance w/ PT.  Objective:   VITALS:   Vitals:   05/11/16 1318 05/11/16 1948 05/12/16 0400 05/12/16 0500  BP: 121/81 (!) 142/56  98/62  Pulse: (!) 109 (!) 111  (!) 115  Resp: 20 20    Temp: 99.9 F (37.7 C) (!) 102.9 F (39.4 C) (!) 100.8 F (38.2 C) (!) 100.8 F (38.2 C)  TempSrc: Oral Oral    SpO2: 100% 98%  95%  Weight:      Height:       CBC Latest Ref Rng & Units 05/12/2016 05/11/2016 05/10/2016  WBC 4.0 - 10.5 K/uL 16.1(H) 17.9(H) 10.7(H)  Hemoglobin 13.0 - 17.0 g/dL 10.7(L) 12.2(L) 12.8(L)  Hematocrit 39.0 - 52.0 % 31.7(L) 36.1(L) 36.5(L)  Platelets 150 - 400 K/uL 180 240 206   BMP Latest Ref Rng & Units 05/12/2016 05/11/2016 05/10/2016  Glucose 65 - 99 mg/dL 203(H) 204(H) 336(H)  BUN 6 - 20 mg/dL 24(H) 22(H) 30(H)  Creatinine 0.61 - 1.24 mg/dL 1.93(H) 1.67(H) 1.82(H)  BUN/Creat Ratio 9 - 20 - - -  Sodium 135 - 145 mmol/L 131(L) 137 135  Potassium 3.5 - 5.1 mmol/L 4.2  4.7 4.4  Chloride 101 - 111 mmol/L 98(L) 99(L) 101  CO2 22 - 32 mmol/L 25 27 22   Calcium 8.9 - 10.3 mg/dL 8.4(L) 8.9 9.2   Intake/Output      04/07 0701 - 04/08 0700 04/08 0701 - 04/09 0700   P.O. 240    I.V. (mL/kg) 600 (6.6)    IV Piggyback 50    Total Intake(mL/kg) 890 (9.7)    Urine (mL/kg/hr) 1550 (0.7)    Blood     Total Output 1550     Net -660            Physical Exam: General: NAD.  Upright in bed.  Calm, conversant.  Mother at bedside. MSK Neurovascularly intact Sensation intact distally Feet warm. Dorsiflexion/Plantar flexion intact Incision: dressing C/D/I   Prudencio Burly III, PA-C 05/12/2016, 9:04 AM

## 2016-05-13 ENCOUNTER — Inpatient Hospital Stay (HOSPITAL_COMMUNITY): Payer: Worker's Compensation

## 2016-05-13 ENCOUNTER — Encounter (HOSPITAL_COMMUNITY): Payer: Self-pay | Admitting: Orthopedic Surgery

## 2016-05-13 DIAGNOSIS — Z8781 Personal history of (healed) traumatic fracture: Secondary | ICD-10-CM

## 2016-05-13 DIAGNOSIS — Z9889 Other specified postprocedural states: Secondary | ICD-10-CM

## 2016-05-13 DIAGNOSIS — R509 Fever, unspecified: Secondary | ICD-10-CM

## 2016-05-13 DIAGNOSIS — X58XXXD Exposure to other specified factors, subsequent encounter: Secondary | ICD-10-CM

## 2016-05-13 LAB — CBC
HCT: 28.1 % — ABNORMAL LOW (ref 39.0–52.0)
Hemoglobin: 9.6 g/dL — ABNORMAL LOW (ref 13.0–17.0)
MCH: 33.1 pg (ref 26.0–34.0)
MCHC: 34.2 g/dL (ref 30.0–36.0)
MCV: 96.9 fL (ref 78.0–100.0)
PLATELETS: 213 10*3/uL (ref 150–400)
RBC: 2.9 MIL/uL — AB (ref 4.22–5.81)
RDW: 12.3 % (ref 11.5–15.5)
WBC: 16.1 10*3/uL — AB (ref 4.0–10.5)

## 2016-05-13 LAB — BASIC METABOLIC PANEL
ANION GAP: 9 (ref 5–15)
BUN: 23 mg/dL — AB (ref 6–20)
CO2: 22 mmol/L (ref 22–32)
Calcium: 8.3 mg/dL — ABNORMAL LOW (ref 8.9–10.3)
Chloride: 99 mmol/L — ABNORMAL LOW (ref 101–111)
Creatinine, Ser: 1.92 mg/dL — ABNORMAL HIGH (ref 0.61–1.24)
GFR calc Af Amer: 46 mL/min — ABNORMAL LOW (ref >60)
GFR calc non Af Amer: 40 mL/min — ABNORMAL LOW (ref >60)
Glucose, Bld: 258 mg/dL — ABNORMAL HIGH (ref 65–99)
POTASSIUM: 4.3 mmol/L (ref 3.5–5.1)
SODIUM: 130 mmol/L — AB (ref 135–145)

## 2016-05-13 LAB — GLUCOSE, CAPILLARY
GLUCOSE-CAPILLARY: 215 mg/dL — AB (ref 65–99)
GLUCOSE-CAPILLARY: 226 mg/dL — AB (ref 65–99)
GLUCOSE-CAPILLARY: 215 mg/dL — AB (ref 65–99)
Glucose-Capillary: 243 mg/dL — ABNORMAL HIGH (ref 65–99)

## 2016-05-13 MED ORDER — NEPRO/CARBSTEADY PO LIQD
237.0000 mL | Freq: Every day | ORAL | Status: DC
  Filled 2016-05-13 (×2): qty 237

## 2016-05-13 NOTE — Progress Notes (Signed)
Subjective: c/o nausea  Antibiotics:  Anti-infectives    Start     Dose/Rate Route Frequency Ordered Stop   05/11/16 0600  ceFAZolin (ANCEF) IVPB 2g/100 mL premix  Status:  Discontinued     2 g 200 mL/hr over 30 Minutes Intravenous On call to O.R. 05/10/16 1224 05/10/16 1309   05/10/16 1900  ceFAZolin (ANCEF) IVPB 2g/100 mL premix     2 g 200 mL/hr over 30 Minutes Intravenous Every 6 hours 05/10/16 1725 05/11/16 0211   05/10/16 1315  ceFAZolin (ANCEF) IVPB 2g/100 mL premix     2 g 200 mL/hr over 30 Minutes Intravenous On call to O.R. 05/10/16 1224 05/10/16 1308   05/10/16 1229  ceFAZolin (ANCEF) 2-4 GM/100ML-% IVPB    Comments:  Schonewitz, Leigh   : cabinet override      05/10/16 1229 05/10/16 1308   05/10/16 0100  abacavir (ZIAGEN) tablet 600 mg     600 mg Oral Daily at bedtime 05/10/16 0054     05/10/16 0100  lamiVUDine (EPIVIR) tablet 300 mg     300 mg Oral Daily at bedtime 05/10/16 0054     05/09/16 2345  abacavir-lamiVUDine (EPZICOM) 600-300 MG per tablet 1 tablet  Status:  Discontinued     1 tablet Oral Daily at bedtime 05/09/16 2332 05/10/16 0053   05/09/16 2345  dolutegravir (TIVICAY) tablet 50 mg     50 mg Oral Daily at bedtime 05/09/16 2332     05/09/16 2345  acyclovir (ZOVIRAX) tablet 400 mg     400 mg Oral 2 times daily 05/09/16 2332        Medications: Scheduled Meds: . abacavir  600 mg Oral QHS   And  . lamiVUDine  300 mg Oral QHS  . acyclovir  400 mg Oral BID  . docusate sodium  100 mg Oral BID  . dolutegravir  50 mg Oral QHS  . DULoxetine  60 mg Oral QHS  . enoxaparin (LOVENOX) injection  40 mg Subcutaneous Q24H  . febuxostat  40 mg Oral Daily  . feeding supplement (NEPRO CARB STEADY)  237 mL Oral QHS  . ferrous sulfate  325 mg Oral TID PC  . fludrocortisone  0.1 mg Oral Daily  . fluocinonide cream  1 application Topical BID  . fluticasone  2 spray Each Nare Daily  . furosemide  20 mg Oral BID  . insulin pump   Subcutaneous TID AC, HS,  0200  . levothyroxine  175 mcg Oral QAC breakfast  . metoCLOPramide  5 mg Oral BID  . niacin  1,500 mg Oral QHS  . omega-3 acid ethyl esters  2 g Oral BID  . pantoprazole  40 mg Oral Daily  . senna  1 tablet Oral BID  . simvastatin  40 mg Oral q1800   Continuous Infusions: . sodium chloride 75 mL/hr (05/13/16 0138)   PRN Meds:.acetaminophen **OR** acetaminophen, alum & mag hydroxide-simeth, bisacodyl, HYDROmorphone (DILAUDID) injection, magnesium citrate, menthol-cetylpyridinium **OR** phenol, methocarbamol (ROBAXIN)  IV, ondansetron **OR** ondansetron (ZOFRAN) IV, oxyCODONE, polyethylene glycol    Objective: Weight change:   Intake/Output Summary (Last 24 hours) at 05/13/16 1847 Last data filed at 05/13/16 1300  Gross per 24 hour  Intake             3120 ml  Output             2850 ml  Net  270 ml   Blood pressure 115/62, pulse (!) 114, temperature 99.3 F (37.4 C), temperature source Oral, resp. rate 18, height 5\' 10"  (1.778 m), weight 201 lb 6.4 oz (91.4 kg), SpO2 97 %. Temp:  [99.3 F (37.4 C)-99.8 F (37.7 C)] 99.3 F (37.4 C) (04/09 1451) Pulse Rate:  [108-114] 114 (04/09 1451) Resp:  [18] 18 (04/09 1451) BP: (99-141)/(62-75) 115/62 (04/09 1451) SpO2:  [94 %-97 %] 97 % (04/09 1451)  Physical Exam: General: Alert and awake, oriented x3, not in any acute distress. HEENT: anicteric sclera, pupils reactive to light and accommodation, EOMI CVS regular rate, normal r,  no murmur rubs or gallops Chest: clear to auscultation bilaterally, no wheezing, rales or rhonchi Abdomen: soft nontender, nondistended, normal bowel sounds, Extremities: no  clubbing or edema noted bilaterally Skin: surgical site with bandage, slightly warm and tenderness near dressing   Neuro: nonfocal  CBC:    BMET  Recent Labs  05/12/16 0322 05/13/16 0503  NA 131* 130*  K 4.2 4.3  CL 98* 99*  CO2 25 22  GLUCOSE 203* 258*  BUN 24* 23*  CREATININE 1.93* 1.92*  CALCIUM 8.4*  8.3*     Liver Panel  No results for input(s): PROT, ALBUMIN, AST, ALT, ALKPHOS, BILITOT, BILIDIR, IBILI in the last 72 hours.     Sedimentation Rate No results for input(s): ESRSEDRATE in the last 72 hours. C-Reactive Protein No results for input(s): CRP in the last 72 hours.  Micro Results: Recent Results (from the past 720 hour(s))  Microscopic Examination     Status: None   Collection Time: 04/18/16  4:47 PM  Result Value Ref Range Status   WBC, UA 0-5 0 - 5 /hpf Final   RBC, UA 0-2 0 - 2 /hpf Final   Epithelial Cells (non renal) 0-10 0 - 10 /hpf Final   Casts None seen None seen /lpf Final   Mucus, UA Present Not Estab. Final   Bacteria, UA Few None seen/Few Final  Surgical pcr screen     Status: None   Collection Time: 05/10/16  3:13 AM  Result Value Ref Range Status   MRSA, PCR NEGATIVE NEGATIVE Final   Staphylococcus aureus NEGATIVE NEGATIVE Final    Comment:        The Xpert SA Assay (FDA approved for NASAL specimens in patients over 90 years of age), is one component of a comprehensive surveillance program.  Test performance has been validated by [facility] for patients greater than or equal to 80 year old. It is not intended to diagnose infection nor to guide or monitor treatment.   Culture, blood (routine x 2)     Status: None (Preliminary result)   Collection Time: 05/11/16  9:05 AM  Result Value Ref Range Status   Specimen Description BLOOD LEFT ANTECUBITAL  Final   Special Requests IN PEDIATRIC BOTTLE Blood Culture adequate volume  Final   Culture NO GROWTH 1 DAY  Final   Report Status PENDING  Incomplete  Culture, blood (routine x 2)     Status: None (Preliminary result)   Collection Time: 05/11/16  9:10 AM  Result Value Ref Range Status   Specimen Description BLOOD LEFT ANTECUBITAL  Final   Special Requests IN PEDIATRIC BOTTLE Blood Culture adequate volume  Final   Culture NO GROWTH 1 DAY  Final   Report Status PENDING  Incomplete     Studies/Results: Dg Abd Portable 2v  Result Date: 05/12/2016 CLINICAL DATA:  Fever.  Rule out  ileus. EXAM: PORTABLE ABDOMEN - 2 VIEW COMPARISON:  Chest x-ray 05/11/2016 and abdominal films 04/17/2012 FINDINGS: Bowel gas pattern is nonobstructive with several air-filled nondilated loops of small bowel present. Air is present throughout the colon. No evidence of mass or mass effect. Several pelvic calcifications compatible with phleboliths. Minimal degenerative change of the left hip. Right hip prosthesis. IMPRESSION: Nonspecific, nonobstructive bowel gas pattern. Electronically Signed   By: Marin Olp M.D.   On: 05/12/2016 23:58   Dg Hip Port Unilat With Pelvis 1v Right  Result Date: 05/12/2016 CLINICAL DATA:  Fever.  Recent right hemiarthroplasty. EXAM: DG HIP (WITH OR WITHOUT PELVIS) 1V PORT RIGHT COMPARISON:  May 10, 2016 FINDINGS: The patient is status post right hip replacement. Acetabular and femoral components are in good position. A small amount of subcutaneous air may be due to the surgery 2 days ago. No other acute abnormalities. IMPRESSION: Right hip replacement as above. The small amount of subcutaneous air could be postoperative. Electronically Signed   By: Dorise Bullion III M.D   On: 05/12/2016 16:21      Assessment/Plan:  INTERVAL HISTORY: fevers seem to have defervesced but not been 24 hours yet   Principal Problem:   Closed right hip fracture, initial encounter ([place]) Active Problems:   Type 1 diabetes mellitus ([place])   Acquired hypothyroidism   Convulsions/seizures ([place])   HIV disease ([place])   OSA (obstructive sleep apnea)   Hip fracture ([place])    Patrick Brown is a 46 y.o. male with  Perfectly controlled HIV now sp closed reduction for hip fracture with postop fevers  #1 Postop fevers: no clear cut cause. If they continue I will of course worry about his surgical site. He is NEARLY 24 hours without fever. ISpriometry encouraged  #2 HIV: he is still having to  pay $400 per month in copays. Would he not now qualify for SPAP? WIll check with Juliann Pulse in our clinic. Would be nice for him to be on STR such as TRIUMEQ of BIKTARVY as well     LOS: 4 days   Alcide Evener 05/13/2016, 6:47 PM
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
## 2016-05-13 NOTE — Progress Notes (Signed)
Orthopedic Tech Progress Note Patient Details:  Patrick Brown 10-25-1970 [id number]  Ortho Devices Ortho Device/Splint Location: trapeze bar patient helper   Hildred Priest 05/13/2016, 9:46 AM

## 2016-05-13 NOTE — Progress Notes (Signed)
*  PRELIMINARY RESULTS* Vascular Ultrasound Bilateral lower extremity venous duplex has been completed.  Preliminary findings: No evidence of deep vein thrombosis or baker's cyst in the right lower extremity.  Everrett Coombe 05/13/2016, 11:58 AM

## 2016-05-13 NOTE — Care Management Important Message (Signed)
Important Message  Patient Details  Name: Patrick Brown MRN: [id number] Date of Birth: 04/11/1970   Medicare Important Message Given:  Yes    Darcey Demma Montine Circle 05/13/2016, 2:53 PM

## 2016-05-13 NOTE — Progress Notes (Signed)
qPhysical Therapy Treatment Patient Details Name: Patrick Brown MRN: [id number] DOB: 1970/09/03 Today's Date: 05/13/2016    History of Present Illness Pt is a 46 y.o. male s/p R hip hemiarthroplasty. PMH: DM I, Seizures, HIV, OSA.    PT Comments    Pt extremely apprehensive about movement today. Pt minAx2 for bed mobility, and transfers and modAx1 for ambulation of 20 feet with RW. Pt movement is extremely slow and limited by fear of falling. Pt unable to lift legs high enough to be able to climb stairs at this time. PT suggests change in d/c to SNF for skilled PT to advance bed mobility, transfer and gait training as well as to progress LE strengthening and endurance to safely return to his home environment.   Follow Up Recommendations  Supervision/Assistance - 24 hour;SNF     Equipment Recommendations  Rolling walker with 5" wheels;3in1 (PT)    Recommendations for Other Services       Precautions / Restrictions Precautions Precautions: Posterior Hip;Fall Precaution Booklet Issued: No Precaution Comments: Educated pt on posterior hip precautions, able to recall 3/3 at end of session. Restrictions Weight Bearing Restrictions: Yes RLE Weight Bearing: Weight bearing as tolerated    Mobility  Bed Mobility Overal bed mobility: Needs Assistance Bed Mobility: Supine to Sit     Supine to sit: +2 for physical assistance;Min assist     General bed mobility comments: PT assisted with guiding R LE off bed and bringing trunk to upright. PT simulated home conditions with head of bed flat. Pt stated that his mother was purchasing a bed rail to be used at home. max vc for hand placement and push off from bed surface to bring trunk to upright   Transfers Overall transfer level: Needs assistance Equipment used: Rolling walker (2 wheeled) Transfers: Sit to/from [facility] Sit to Stand: Min assist;+2 physical assistance Stand pivot transfers: Min assist;+2 physical  assistance       General transfer comment: vc fro hand placement for puch off.   Ambulation/Gait Ambulation/Gait assistance: Min assist Ambulation Distance (Feet): 20 Feet Assistive device: Rolling walker (2 wheeled) Gait Pattern/deviations: Step-to pattern;Decreased step length - right;Decreased step length - left Gait velocity: decreased Gait velocity interpretation: Below normal speed for age/gender General Gait Details: max vc for sequencing, relaxation of shoulders, decreased grip on RW, increased step length and increased cadence   Stairs            Wheelchair Mobility    Modified Rankin (Stroke Patients Only)       Balance Overall balance assessment: Needs assistance Sitting-balance support: Feet supported;Bilateral upper extremity supported Sitting balance-Leahy Scale: Fair Sitting balance - Comments: does not attempt single UE support   Standing balance support: Bilateral upper extremity supported Standing balance-Leahy Scale: Poor Standing balance comment: RW for support                            Cognition Arousal/Alertness: Awake/alert Behavior During Therapy: Anxious Overall Cognitive Status: Within Functional Limits for tasks assessed                                        Exercises Total Joint Exercises Ankle Circles/Pumps: AROM;Both;10 reps;Seated Quad Sets: AROM;Both;10 reps;Seated    General Comments        Pertinent Vitals/Pain Pain Assessment: No/denies pain Pain Intervention(s): Monitored during session;Limited activity  within patient's tolerance        Pt c/o of nausea, nursing administered medication during session.          PT Goals (current goals can now be found in the care plan section) Acute Rehab PT Goals Patient Stated Goal: return home PT Goal Formulation: With patient Potential to Achieve Goals: Fair Progress towards PT goals: Progressing toward goals (very slow progress )     Frequency    Min 5X/week      PT Plan Discharge plan needs to be updated    Co-evaluation PT/OT/SLP Co-Evaluation/Treatment: Yes           End of Session Equipment Utilized During Treatment: Gait belt Activity Tolerance:  (pt limited by fear of falling ) Patient left: in chair;with call bell/phone within reach Nurse Communication: Mobility status PT Visit Diagnosis: Muscle weakness (generalized) (M62.81);Other abnormalities of gait and mobility (R26.89);Pain Pain - Right/Left: Right Pain - part of body: Hip     Time: 3005-1102 PT Time Calculation (min) (ACUTE ONLY): 46 min  Charges:  $Gait Training: 8-22 mins $Therapeutic Exercise: 8-22 mins $Therapeutic Activity: 8-22 mins                    G Codes:       Ashley Montminy B. Migdalia Dk PT, DPT Acute Rehabilitation  [phone] Pager [phone]    [place] 05/13/2016, 3:09 PM

## 2016-05-13 NOTE — Care Management Note (Signed)
Case Management Note  Patient Details  Name: Patrick Brown MRN: [id number] Date of Birth: 05-30-1970  Subjective/Objective:                    Action/Plan:   Expected Discharge Date:                  Expected Discharge Plan:  [place]  In-House Referral:     Discharge planning Services  CM Consult  Post Acute Care Choice:  Home Health Choice offered to:  Patient  DME Arranged:  3-N-1, Walker rolling DME Agency:  [place]:  PT [place]:  [facility]  Status of Service:  In process, will continue to follow  If discussed at Long Length of Stay Meetings, dates discussed:    Additional Comments:  Marilu Favre, RN 05/13/2016, 11:44 AM
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
## 2016-05-13 NOTE — Progress Notes (Addendum)
Inpatient Diabetes Program Recommendations  AACE/ADA: New Consensus Statement on Inpatient Glycemic Control (2015)  Target Ranges:  Prepandial:   less than 140 mg/dL      Peak postprandial:   less than 180 mg/dL (1-2 hours)      Critically ill patients:  140 - 180 mg/dL   Followed up with patient this morning. Glucose in the low 200's still. Patient reports having a "bad day, not feeling well." Last A1c per patient report was 7.5% (the highest since its been). Patient changes insulin pump site every 6 days with his continuous glucose monitor site change (preferred practice site is changed every 3-4 days). Patient follows with Dr. Laurance Flatten his PCP for glucose control. There is a CDE pump trainer that is a pharmacist at his office that helps patient. PCP office at [facility] practice. Patient reports glucose levels around 150-160s on average lately (consistent with A1c level).   Prefer patient to change insulin pump site today (last site change Friday 4/6) since glucose range is above inpatient goal. Verbalized to patient. Patient wanting to keep same site in at this time.   Thanks,  Tama Headings RN, MSN, [facility] Inpatient Diabetes Coordinator Team Pager [phone] (8a-5p)

## 2016-05-14 DIAGNOSIS — R509 Fever, unspecified: Secondary | ICD-10-CM | POA: Diagnosis present

## 2016-05-14 LAB — BASIC METABOLIC PANEL
Anion gap: 12 (ref 5–15)
BUN: 21 mg/dL — ABNORMAL HIGH (ref 6–20)
CALCIUM: 8.6 mg/dL — AB (ref 8.9–10.3)
CO2: 23 mmol/L (ref 22–32)
CREATININE: 1.92 mg/dL — AB (ref 0.61–1.24)
Chloride: 96 mmol/L — ABNORMAL LOW (ref 101–111)
GFR calc non Af Amer: 40 mL/min — ABNORMAL LOW (ref >60)
GFR, EST AFRICAN AMERICAN: 46 mL/min — AB (ref >60)
Glucose, Bld: 231 mg/dL — ABNORMAL HIGH (ref 65–99)
Potassium: 3.7 mmol/L (ref 3.5–5.1)
Sodium: 131 mmol/L — ABNORMAL LOW (ref 135–145)

## 2016-05-14 LAB — CBC
HEMATOCRIT: 27.9 % — AB (ref 39.0–52.0)
Hemoglobin: 9.6 g/dL — ABNORMAL LOW (ref 13.0–17.0)
MCH: 32.7 pg (ref 26.0–34.0)
MCHC: 34.4 g/dL (ref 30.0–36.0)
MCV: 94.9 fL (ref 78.0–100.0)
Platelets: 250 10*3/uL (ref 150–400)
RBC: 2.94 MIL/uL — ABNORMAL LOW (ref 4.22–5.81)
RDW: 12.1 % (ref 11.5–15.5)
WBC: 13.4 10*3/uL — ABNORMAL HIGH (ref 4.0–10.5)

## 2016-05-14 LAB — GLUCOSE, CAPILLARY
GLUCOSE-CAPILLARY: 228 mg/dL — AB (ref 65–99)
Glucose-Capillary: 271 mg/dL — ABNORMAL HIGH (ref 65–99)
Glucose-Capillary: 273 mg/dL — ABNORMAL HIGH (ref 65–99)
Glucose-Capillary: 280 mg/dL — ABNORMAL HIGH (ref 65–99)

## 2016-05-14 MED ORDER — POLYETHYLENE GLYCOL 3350 17 G PO PACK: 17.0000 g | PACK | Freq: Every day | ORAL | 0 refills | Status: DC | PRN

## 2016-05-14 MED ORDER — SENNA 8.6 MG PO TABS: 1.0000 | ORAL_TABLET | Freq: Two times a day (BID) | ORAL | 0 refills | Status: DC

## 2016-05-14 MED ORDER — ONDANSETRON HCL 4 MG PO TABS: 4.0000 mg | ORAL_TABLET | Freq: Three times a day (TID) | ORAL | 0 refills | Status: DC | PRN

## 2016-05-14 MED ORDER — NEPRO/CARBSTEADY PO LIQD: 237.0000 mL | Freq: Every day | ORAL | 0 refills | Status: DC

## 2016-05-14 MED ORDER — BACLOFEN 10 MG PO TABS: 10.0000 mg | ORAL_TABLET | Freq: Three times a day (TID) | ORAL | 0 refills | Status: DC

## 2016-05-14 NOTE — Progress Notes (Signed)
Patient ID: Patrick Brown, male   DOB: August 15, 1970, 46 y.o.   MRN: [id number]     Subjective:  Patient reports pain as mild.  Patient sitting in the chair and in no acute distress.  Objective:   VITALS:   Vitals:   05/13/16 0622 05/13/16 1451 05/13/16 1956 05/14/16 0428  BP: (!) 141/75 115/62 140/70 (!) 148/74  Pulse: (!) 108 (!) 114 (!) 113 (!) 111  Resp: 18 18 19 19   Temp: 99.4 F (37.4 C) 99.3 F (37.4 C) 99.1 F (37.3 C) 98.8 F (37.1 C)  TempSrc: Oral Oral Oral Oral  SpO2: 97% 97% 98% 98%  Weight:      Height:        ABD soft Sensation intact distally Dorsiflexion/Plantar flexion intact Incision: dressing C/D/I and no drainage   Lab Results  Component Value Date   WBC 13.4 (H) 05/14/2016   HGB 9.6 (L) 05/14/2016   HCT 27.9 (L) 05/14/2016   MCV 94.9 05/14/2016   PLT 250 05/14/2016   BMET    Component Value Date/Time   NA 131 (L) 05/14/2016 0521   NA 140 05/06/2016 0808   K 3.7 05/14/2016 0521   CL 96 (L) 05/14/2016 0521   CO2 23 05/14/2016 0521   GLUCOSE 231 (H) 05/14/2016 0521   BUN 21 (H) 05/14/2016 0521   BUN 34 (H) 05/06/2016 0808   CREATININE 1.92 (H) 05/14/2016 0521   CREATININE 2.40 (H) 09/05/2014 1637   CALCIUM 8.6 (L) 05/14/2016 0521   CALCIUM 10.1 08/10/2012 1143   GFRNONAA 40 (L) 05/14/2016 0521   GFRNONAA 31 (L) 09/05/2014 1637   GFRAA 46 (L) 05/14/2016 0521   GFRAA 36 (L) 09/05/2014 1637     Assessment/Plan: 4 Days Post-Op   Principal Problem:   Closed right hip fracture, initial encounter (HCC) Active Problems:   Chronic kidney disease, stage 3, mod decreased GFR   Type 1 diabetes mellitus (HCC)   Acquired hypothyroidism   SIRS (systemic inflammatory response syndrome) (HCC)   Convulsions/seizures (HCC)   HIV disease (HCC)   OSA (obstructive sleep apnea)   S/P ORIF (open reduction internal fixation) fracture   Fever   Advance diet Up with therapy Continue plan per medicine WBAT Dry dressing PRN   DOUGLAS PARRY,  BRANDON 05/14/2016, 10:43 AM  Discussed and agree with above.    Marchia Bond, MD Cell [phone]
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
## 2016-05-14 NOTE — Progress Notes (Signed)
Occupational Therapy Treatment Patient Details Name: Patrick Brown MRN: [id number] DOB: January 14, 1971 Today's Date: 05/14/2016    History of present illness Pt is a 46 y.o. male s/p R hip hemiarthroplasty. PMH: DM I, Seizures, HIV, OSA.   OT comments  Pt participated in ADL retraining session today for bed mobility, transfers and pt/family education re:ADL's. He is progressing slowly toward [facility] and goals for OT, requires increased time for all tasks. He is currently +1 assist. He plans to d/c home later today with PRN family assistance for ADL's and transfers. Agree with recommended 3:1, tub bench and HHOT.   Follow Up Recommendations  Home health OT;Supervision/Assistance - 24 hour    Equipment Recommendations  Tub/shower bench;3 in 1 bedside commode    Recommendations for Other Services      Precautions / Restrictions Precautions Precautions: Posterior Hip;Fall Precaution Booklet Issued: No Precaution Comments: Educated pt on posterior hip precautions, able to recall 3/3 at end of session. Required Braces or Orthoses: Other Brace/Splint Other Brace/Splint: hip abduction pillow Restrictions Weight Bearing Restrictions: Yes RLE Weight Bearing: Weight bearing as tolerated       Mobility Bed Mobility Overal bed mobility: Needs Assistance Bed Mobility: Supine to Sit     Supine to sit: Min assist;HOB elevated     General bed mobility comments: OT assisted with guiding R LE off bed, increased time for pt to bring trunck upright. Discussed possible log roll when at home and mother assisting PRN w/ LE's and HOB flat. Max vc for hand placement and push off from bed surface to bring trunk upright. Pt mother states that she is purchasing bed rail for use at home.  Transfers Overall transfer level: Needs assistance Equipment used: Rolling walker (2 wheeled) Transfers: Sit to/from [facility] Sit to Stand: Min assist;From elevated surface (Increased time; vc's for  hand placement on bed and not to pull up on RW) Stand pivot transfers: Min assist       General transfer comment: Increased time. VC's for hand placement, RW safety and sequencing.      Balance Overall balance assessment: Needs assistance Sitting-balance support: Feet supported;Bilateral upper extremity supported Sitting balance-Leahy Scale: Good     Standing balance support: Bilateral upper extremity supported Standing balance-Leahy Scale: Fair Standing balance comment: RW for support                           ADL either performed or assessed with clinical judgement   ADL Overall ADL's : Needs assistance/impaired Eating/Feeding: Set up;Sitting   Grooming: Min guard;Sitting                   Toilet Transfer: Minimal assistance;Ambulation;RW;BSC (Pt declined toilet transfer x2 but was agreeable to getting OOB to chair for breakfast after pain meds, for simulated toilet transfer) Toilet Transfer Details (indicate cue type and reason): Discussed set up of 3:1 at home over toilet vs keeping it near his bed            General ADL Comments: OT attempted ADL retraining session earlier this morning, after about 12-15 min, pt asked OT to return later when he "was ready" and after pain medications. During first session, discussed set up for ADL's and assist that his mother may be able to provide. Second session, pt req Increased time for all task completion moves SLOW!!! reviewed use of urinal at night as pt reports bathroom is down hall from bed room. Also discussed possbility  of leaving 3:1 in bed room at noc if urinating frequently. Discussed bed mobility, toilet transfers and RW safety and sequencing with pt and pt's mother this date. Pt was only +1 today but cont to require increased time for all tasks.     Vision Baseline Vision/History: Wears glasses Patient Visual Report: No change from baseline     Perception     Praxis      Cognition Arousal/Alertness:  Awake/alert Behavior During Therapy: Anxious (Requires increased time for all tasks) Overall Cognitive Status: Within Functional Limits for tasks assessed                                          Exercises     Shoulder Instructions       General Comments      Pertinent Vitals/ Pain       Pain Assessment: 0-10 Pain Score: 6  Pain Location: R hip Pain Descriptors / Indicators: Sore Pain Intervention(s): Limited activity within patient's tolerance;Monitored during session;Premedicated before session;Repositioned  Home Living                                          Prior Functioning/Environment              Frequency  Min 2X/week        Progress Toward Goals  OT Goals(current goals can now be found in the care plan section)  Progress towards OT goals: Progressing toward goals  Acute Rehab OT Goals Patient Stated Goal: D/c home later today with PRN family assist  Plan Discharge plan remains appropriate    Co-evaluation                 End of Session Equipment Utilized During Treatment: Gait belt;Rolling walker  OT Visit Diagnosis: Other abnormalities of gait and mobility (R26.89);Pain Pain - Right/Left: Right Pain - part of body: Hip   Activity Tolerance Patient tolerated treatment well   Patient Left in chair;with call bell/phone within reach;Other (comment);with family/visitor present (Sitting up and eating breakfast)   Nurse Communication          Time: 4166-0630 OT Time Calculation (min): 29 min  Charges: OT General Charges $OT Visit: 1 Procedure OT Treatments $Self Care/Home Management : 23-37 mins  Patrick Brown, OTR/L 05/14/16 9:59 AM     Patrick Brown, OTR/L 05/14/2016, 9:56 AM

## 2016-05-16 DIAGNOSIS — M1990 Unspecified osteoarthritis, unspecified site: Secondary | ICD-10-CM | POA: Diagnosis not present

## 2016-05-16 DIAGNOSIS — Z79891 Long term (current) use of opiate analgesic: Secondary | ICD-10-CM | POA: Diagnosis not present

## 2016-05-16 DIAGNOSIS — E1022 Type 1 diabetes mellitus with diabetic chronic kidney disease: Secondary | ICD-10-CM | POA: Diagnosis not present

## 2016-05-16 DIAGNOSIS — Z96641 Presence of right artificial hip joint: Secondary | ICD-10-CM | POA: Diagnosis not present

## 2016-05-16 DIAGNOSIS — K3184 Gastroparesis: Secondary | ICD-10-CM | POA: Diagnosis not present

## 2016-05-16 DIAGNOSIS — N183 Chronic kidney disease, stage 3 (moderate): Secondary | ICD-10-CM | POA: Diagnosis not present

## 2016-05-16 DIAGNOSIS — S72001D Fracture of unspecified part of neck of right femur, subsequent encounter for closed fracture with routine healing: Secondary | ICD-10-CM | POA: Diagnosis not present

## 2016-05-16 DIAGNOSIS — E10319 Type 1 diabetes mellitus with unspecified diabetic retinopathy without macular edema: Secondary | ICD-10-CM | POA: Diagnosis not present

## 2016-05-16 DIAGNOSIS — E1043 Type 1 diabetes mellitus with diabetic autonomic (poly)neuropathy: Secondary | ICD-10-CM | POA: Diagnosis not present

## 2016-05-16 DIAGNOSIS — M109 Gout, unspecified: Secondary | ICD-10-CM | POA: Diagnosis not present

## 2016-05-16 DIAGNOSIS — W19XXXD Unspecified fall, subsequent encounter: Secondary | ICD-10-CM | POA: Diagnosis not present

## 2016-05-16 LAB — CULTURE, BLOOD (ROUTINE X 2)
CULTURE: NO GROWTH
CULTURE: NO GROWTH
Special Requests: ADEQUATE
Special Requests: ADEQUATE

## 2016-05-17 DIAGNOSIS — N183 Chronic kidney disease, stage 3 (moderate): Secondary | ICD-10-CM | POA: Diagnosis not present

## 2016-05-17 DIAGNOSIS — K3184 Gastroparesis: Secondary | ICD-10-CM | POA: Diagnosis not present

## 2016-05-17 DIAGNOSIS — Z96641 Presence of right artificial hip joint: Secondary | ICD-10-CM | POA: Diagnosis not present

## 2016-05-17 DIAGNOSIS — M1990 Unspecified osteoarthritis, unspecified site: Secondary | ICD-10-CM | POA: Diagnosis not present

## 2016-05-17 DIAGNOSIS — Z79891 Long term (current) use of opiate analgesic: Secondary | ICD-10-CM | POA: Diagnosis not present

## 2016-05-17 DIAGNOSIS — S72001D Fracture of unspecified part of neck of right femur, subsequent encounter for closed fracture with routine healing: Secondary | ICD-10-CM | POA: Diagnosis not present

## 2016-05-17 DIAGNOSIS — E1022 Type 1 diabetes mellitus with diabetic chronic kidney disease: Secondary | ICD-10-CM | POA: Diagnosis not present

## 2016-05-17 DIAGNOSIS — E10319 Type 1 diabetes mellitus with unspecified diabetic retinopathy without macular edema: Secondary | ICD-10-CM | POA: Diagnosis not present

## 2016-05-17 DIAGNOSIS — W19XXXD Unspecified fall, subsequent encounter: Secondary | ICD-10-CM | POA: Diagnosis not present

## 2016-05-17 DIAGNOSIS — E1043 Type 1 diabetes mellitus with diabetic autonomic (poly)neuropathy: Secondary | ICD-10-CM | POA: Diagnosis not present

## 2016-05-17 DIAGNOSIS — M109 Gout, unspecified: Secondary | ICD-10-CM | POA: Diagnosis not present

## 2016-05-20 DIAGNOSIS — M109 Gout, unspecified: Secondary | ICD-10-CM | POA: Diagnosis not present

## 2016-05-20 DIAGNOSIS — K3184 Gastroparesis: Secondary | ICD-10-CM | POA: Diagnosis not present

## 2016-05-20 DIAGNOSIS — E1022 Type 1 diabetes mellitus with diabetic chronic kidney disease: Secondary | ICD-10-CM | POA: Diagnosis not present

## 2016-05-20 DIAGNOSIS — N183 Chronic kidney disease, stage 3 (moderate): Secondary | ICD-10-CM | POA: Diagnosis not present

## 2016-05-20 DIAGNOSIS — M1990 Unspecified osteoarthritis, unspecified site: Secondary | ICD-10-CM | POA: Diagnosis not present

## 2016-05-20 DIAGNOSIS — Z79891 Long term (current) use of opiate analgesic: Secondary | ICD-10-CM | POA: Diagnosis not present

## 2016-05-20 DIAGNOSIS — Z96641 Presence of right artificial hip joint: Secondary | ICD-10-CM | POA: Diagnosis not present

## 2016-05-20 DIAGNOSIS — S72001D Fracture of unspecified part of neck of right femur, subsequent encounter for closed fracture with routine healing: Secondary | ICD-10-CM | POA: Diagnosis not present

## 2016-05-20 DIAGNOSIS — E1043 Type 1 diabetes mellitus with diabetic autonomic (poly)neuropathy: Secondary | ICD-10-CM | POA: Diagnosis not present

## 2016-05-20 DIAGNOSIS — W19XXXD Unspecified fall, subsequent encounter: Secondary | ICD-10-CM | POA: Diagnosis not present

## 2016-05-20 DIAGNOSIS — E10319 Type 1 diabetes mellitus with unspecified diabetic retinopathy without macular edema: Secondary | ICD-10-CM | POA: Diagnosis not present

## 2016-05-22 DIAGNOSIS — E1043 Type 1 diabetes mellitus with diabetic autonomic (poly)neuropathy: Secondary | ICD-10-CM | POA: Diagnosis not present

## 2016-05-22 DIAGNOSIS — S72001D Fracture of unspecified part of neck of right femur, subsequent encounter for closed fracture with routine healing: Secondary | ICD-10-CM | POA: Diagnosis not present

## 2016-05-22 DIAGNOSIS — Z96641 Presence of right artificial hip joint: Secondary | ICD-10-CM | POA: Diagnosis not present

## 2016-05-22 DIAGNOSIS — N183 Chronic kidney disease, stage 3 (moderate): Secondary | ICD-10-CM | POA: Diagnosis not present

## 2016-05-22 DIAGNOSIS — Z79891 Long term (current) use of opiate analgesic: Secondary | ICD-10-CM | POA: Diagnosis not present

## 2016-05-22 DIAGNOSIS — M1990 Unspecified osteoarthritis, unspecified site: Secondary | ICD-10-CM | POA: Diagnosis not present

## 2016-05-22 DIAGNOSIS — E1022 Type 1 diabetes mellitus with diabetic chronic kidney disease: Secondary | ICD-10-CM | POA: Diagnosis not present

## 2016-05-22 DIAGNOSIS — M109 Gout, unspecified: Secondary | ICD-10-CM | POA: Diagnosis not present

## 2016-05-22 DIAGNOSIS — W19XXXD Unspecified fall, subsequent encounter: Secondary | ICD-10-CM | POA: Diagnosis not present

## 2016-05-22 DIAGNOSIS — E10319 Type 1 diabetes mellitus with unspecified diabetic retinopathy without macular edema: Secondary | ICD-10-CM | POA: Diagnosis not present

## 2016-05-22 DIAGNOSIS — K3184 Gastroparesis: Secondary | ICD-10-CM | POA: Diagnosis not present

## 2016-05-24 ENCOUNTER — Other Ambulatory Visit: Payer: Self-pay | Admitting: Family Medicine

## 2016-05-24 DIAGNOSIS — E1043 Type 1 diabetes mellitus with diabetic autonomic (poly)neuropathy: Secondary | ICD-10-CM | POA: Diagnosis not present

## 2016-05-24 DIAGNOSIS — K3184 Gastroparesis: Secondary | ICD-10-CM | POA: Diagnosis not present

## 2016-05-24 DIAGNOSIS — N183 Chronic kidney disease, stage 3 (moderate): Secondary | ICD-10-CM | POA: Diagnosis not present

## 2016-05-24 DIAGNOSIS — S72001D Fracture of unspecified part of neck of right femur, subsequent encounter for closed fracture with routine healing: Secondary | ICD-10-CM | POA: Diagnosis not present

## 2016-05-24 DIAGNOSIS — Z96641 Presence of right artificial hip joint: Secondary | ICD-10-CM | POA: Diagnosis not present

## 2016-05-24 DIAGNOSIS — W19XXXD Unspecified fall, subsequent encounter: Secondary | ICD-10-CM | POA: Diagnosis not present

## 2016-05-24 DIAGNOSIS — M109 Gout, unspecified: Secondary | ICD-10-CM | POA: Diagnosis not present

## 2016-05-24 DIAGNOSIS — Z79891 Long term (current) use of opiate analgesic: Secondary | ICD-10-CM | POA: Diagnosis not present

## 2016-05-24 DIAGNOSIS — M1990 Unspecified osteoarthritis, unspecified site: Secondary | ICD-10-CM | POA: Diagnosis not present

## 2016-05-24 DIAGNOSIS — E1022 Type 1 diabetes mellitus with diabetic chronic kidney disease: Secondary | ICD-10-CM | POA: Diagnosis not present

## 2016-05-24 DIAGNOSIS — E10319 Type 1 diabetes mellitus with unspecified diabetic retinopathy without macular edema: Secondary | ICD-10-CM | POA: Diagnosis not present

## 2016-05-25 ENCOUNTER — Other Ambulatory Visit: Payer: Self-pay | Admitting: Family Medicine

## 2016-05-27 DIAGNOSIS — E1022 Type 1 diabetes mellitus with diabetic chronic kidney disease: Secondary | ICD-10-CM | POA: Diagnosis not present

## 2016-05-27 DIAGNOSIS — Z96641 Presence of right artificial hip joint: Secondary | ICD-10-CM | POA: Diagnosis not present

## 2016-05-27 DIAGNOSIS — Z79891 Long term (current) use of opiate analgesic: Secondary | ICD-10-CM | POA: Diagnosis not present

## 2016-05-27 DIAGNOSIS — S72001D Fracture of unspecified part of neck of right femur, subsequent encounter for closed fracture with routine healing: Secondary | ICD-10-CM | POA: Diagnosis not present

## 2016-05-27 DIAGNOSIS — E10319 Type 1 diabetes mellitus with unspecified diabetic retinopathy without macular edema: Secondary | ICD-10-CM | POA: Diagnosis not present

## 2016-05-27 DIAGNOSIS — M109 Gout, unspecified: Secondary | ICD-10-CM | POA: Diagnosis not present

## 2016-05-27 DIAGNOSIS — W19XXXD Unspecified fall, subsequent encounter: Secondary | ICD-10-CM | POA: Diagnosis not present

## 2016-05-27 DIAGNOSIS — M1990 Unspecified osteoarthritis, unspecified site: Secondary | ICD-10-CM | POA: Diagnosis not present

## 2016-05-27 DIAGNOSIS — E1043 Type 1 diabetes mellitus with diabetic autonomic (poly)neuropathy: Secondary | ICD-10-CM | POA: Diagnosis not present

## 2016-05-27 DIAGNOSIS — N183 Chronic kidney disease, stage 3 (moderate): Secondary | ICD-10-CM | POA: Diagnosis not present

## 2016-05-27 DIAGNOSIS — K3184 Gastroparesis: Secondary | ICD-10-CM | POA: Diagnosis not present

## 2016-05-29 ENCOUNTER — Ambulatory Visit: Payer: Medicare Other | Admitting: Family Medicine

## 2016-05-29 DIAGNOSIS — N183 Chronic kidney disease, stage 3 (moderate): Secondary | ICD-10-CM | POA: Diagnosis not present

## 2016-05-29 DIAGNOSIS — Z96641 Presence of right artificial hip joint: Secondary | ICD-10-CM | POA: Diagnosis not present

## 2016-05-29 DIAGNOSIS — M109 Gout, unspecified: Secondary | ICD-10-CM | POA: Diagnosis not present

## 2016-05-29 DIAGNOSIS — W19XXXD Unspecified fall, subsequent encounter: Secondary | ICD-10-CM | POA: Diagnosis not present

## 2016-05-29 DIAGNOSIS — S72001D Fracture of unspecified part of neck of right femur, subsequent encounter for closed fracture with routine healing: Secondary | ICD-10-CM | POA: Diagnosis not present

## 2016-05-29 DIAGNOSIS — E1043 Type 1 diabetes mellitus with diabetic autonomic (poly)neuropathy: Secondary | ICD-10-CM | POA: Diagnosis not present

## 2016-05-29 DIAGNOSIS — E1022 Type 1 diabetes mellitus with diabetic chronic kidney disease: Secondary | ICD-10-CM | POA: Diagnosis not present

## 2016-05-29 DIAGNOSIS — Z79891 Long term (current) use of opiate analgesic: Secondary | ICD-10-CM | POA: Diagnosis not present

## 2016-05-29 DIAGNOSIS — M1990 Unspecified osteoarthritis, unspecified site: Secondary | ICD-10-CM | POA: Diagnosis not present

## 2016-05-29 DIAGNOSIS — E10319 Type 1 diabetes mellitus with unspecified diabetic retinopathy without macular edema: Secondary | ICD-10-CM | POA: Diagnosis not present

## 2016-05-29 DIAGNOSIS — K3184 Gastroparesis: Secondary | ICD-10-CM | POA: Diagnosis not present

## 2016-05-30 ENCOUNTER — Encounter: Payer: Self-pay | Admitting: Family Medicine

## 2016-05-31 DIAGNOSIS — Z79891 Long term (current) use of opiate analgesic: Secondary | ICD-10-CM | POA: Diagnosis not present

## 2016-05-31 DIAGNOSIS — M1990 Unspecified osteoarthritis, unspecified site: Secondary | ICD-10-CM | POA: Diagnosis not present

## 2016-05-31 DIAGNOSIS — E1043 Type 1 diabetes mellitus with diabetic autonomic (poly)neuropathy: Secondary | ICD-10-CM | POA: Diagnosis not present

## 2016-05-31 DIAGNOSIS — W19XXXD Unspecified fall, subsequent encounter: Secondary | ICD-10-CM | POA: Diagnosis not present

## 2016-05-31 DIAGNOSIS — E1022 Type 1 diabetes mellitus with diabetic chronic kidney disease: Secondary | ICD-10-CM | POA: Diagnosis not present

## 2016-05-31 DIAGNOSIS — E10319 Type 1 diabetes mellitus with unspecified diabetic retinopathy without macular edema: Secondary | ICD-10-CM | POA: Diagnosis not present

## 2016-05-31 DIAGNOSIS — S72001D Fracture of unspecified part of neck of right femur, subsequent encounter for closed fracture with routine healing: Secondary | ICD-10-CM | POA: Diagnosis not present

## 2016-05-31 DIAGNOSIS — Z96641 Presence of right artificial hip joint: Secondary | ICD-10-CM | POA: Diagnosis not present

## 2016-05-31 DIAGNOSIS — K3184 Gastroparesis: Secondary | ICD-10-CM | POA: Diagnosis not present

## 2016-05-31 DIAGNOSIS — M109 Gout, unspecified: Secondary | ICD-10-CM | POA: Diagnosis not present

## 2016-05-31 DIAGNOSIS — N183 Chronic kidney disease, stage 3 (moderate): Secondary | ICD-10-CM | POA: Diagnosis not present

## 2016-06-04 DIAGNOSIS — E1043 Type 1 diabetes mellitus with diabetic autonomic (poly)neuropathy: Secondary | ICD-10-CM | POA: Diagnosis not present

## 2016-06-04 DIAGNOSIS — N183 Chronic kidney disease, stage 3 (moderate): Secondary | ICD-10-CM | POA: Diagnosis not present

## 2016-06-04 DIAGNOSIS — M109 Gout, unspecified: Secondary | ICD-10-CM | POA: Diagnosis not present

## 2016-06-04 DIAGNOSIS — E1022 Type 1 diabetes mellitus with diabetic chronic kidney disease: Secondary | ICD-10-CM | POA: Diagnosis not present

## 2016-06-04 DIAGNOSIS — K3184 Gastroparesis: Secondary | ICD-10-CM | POA: Diagnosis not present

## 2016-06-04 DIAGNOSIS — S72001D Fracture of unspecified part of neck of right femur, subsequent encounter for closed fracture with routine healing: Secondary | ICD-10-CM | POA: Diagnosis not present

## 2016-06-04 DIAGNOSIS — E10319 Type 1 diabetes mellitus with unspecified diabetic retinopathy without macular edema: Secondary | ICD-10-CM | POA: Diagnosis not present

## 2016-06-04 DIAGNOSIS — Z96641 Presence of right artificial hip joint: Secondary | ICD-10-CM | POA: Diagnosis not present

## 2016-06-04 DIAGNOSIS — Z79891 Long term (current) use of opiate analgesic: Secondary | ICD-10-CM | POA: Diagnosis not present

## 2016-06-04 DIAGNOSIS — M1990 Unspecified osteoarthritis, unspecified site: Secondary | ICD-10-CM | POA: Diagnosis not present

## 2016-06-04 DIAGNOSIS — W19XXXD Unspecified fall, subsequent encounter: Secondary | ICD-10-CM | POA: Diagnosis not present

## 2016-06-07 DIAGNOSIS — Z79891 Long term (current) use of opiate analgesic: Secondary | ICD-10-CM | POA: Diagnosis not present

## 2016-06-07 DIAGNOSIS — E1043 Type 1 diabetes mellitus with diabetic autonomic (poly)neuropathy: Secondary | ICD-10-CM | POA: Diagnosis not present

## 2016-06-07 DIAGNOSIS — M1990 Unspecified osteoarthritis, unspecified site: Secondary | ICD-10-CM | POA: Diagnosis not present

## 2016-06-07 DIAGNOSIS — S72001D Fracture of unspecified part of neck of right femur, subsequent encounter for closed fracture with routine healing: Secondary | ICD-10-CM | POA: Diagnosis not present

## 2016-06-07 DIAGNOSIS — K3184 Gastroparesis: Secondary | ICD-10-CM | POA: Diagnosis not present

## 2016-06-07 DIAGNOSIS — E10319 Type 1 diabetes mellitus with unspecified diabetic retinopathy without macular edema: Secondary | ICD-10-CM | POA: Diagnosis not present

## 2016-06-07 DIAGNOSIS — N183 Chronic kidney disease, stage 3 (moderate): Secondary | ICD-10-CM | POA: Diagnosis not present

## 2016-06-07 DIAGNOSIS — W19XXXD Unspecified fall, subsequent encounter: Secondary | ICD-10-CM | POA: Diagnosis not present

## 2016-06-07 DIAGNOSIS — E1022 Type 1 diabetes mellitus with diabetic chronic kidney disease: Secondary | ICD-10-CM | POA: Diagnosis not present

## 2016-06-07 DIAGNOSIS — Z96641 Presence of right artificial hip joint: Secondary | ICD-10-CM | POA: Diagnosis not present

## 2016-06-07 DIAGNOSIS — M109 Gout, unspecified: Secondary | ICD-10-CM | POA: Diagnosis not present

## 2016-06-10 ENCOUNTER — Ambulatory Visit: Payer: Worker's Compensation | Attending: Orthopedic Surgery | Admitting: Physical Therapy

## 2016-06-10 DIAGNOSIS — M25552 Pain in left hip: Secondary | ICD-10-CM | POA: Diagnosis not present

## 2016-06-10 DIAGNOSIS — M6281 Muscle weakness (generalized): Secondary | ICD-10-CM | POA: Diagnosis present
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
## 2016-06-11 ENCOUNTER — Other Ambulatory Visit: Payer: Self-pay | Admitting: Family Medicine

## 2016-06-12 ENCOUNTER — Ambulatory Visit: Payer: Worker's Compensation | Admitting: Physical Therapy

## 2016-06-12 ENCOUNTER — Encounter: Payer: Self-pay | Admitting: Physical Therapy

## 2016-06-12 DIAGNOSIS — M25552 Pain in left hip: Secondary | ICD-10-CM

## 2016-06-12 DIAGNOSIS — M6281 Muscle weakness (generalized): Secondary | ICD-10-CM

## 2016-06-12 NOTE — Therapy (Signed)
[facility] [place], [place], [address] Phone: [phone]   Fax:  [phone]  Physical Therapy Treatment  Patient Details  Name: Patrick Brown MRN: [id number] Date of Birth: [birth year] Referring Provider: Marchia Bond MD  Encounter Date: 06/12/2016      PT End of Session - 06/12/16 1217    Visit Number 2   Number of Visits 12   Date for PT Re-Evaluation 07/22/16   PT Start Time 1118   PT Stop Time 1200   PT Time Calculation (min) 42 min   Activity Tolerance Patient tolerated treatment well   Behavior During Therapy [facility] for tasks assessed/performed      Past Medical History:  Diagnosis Date  . Anemia, iron deficiency On procrit  . CAP (community acquired pneumonia)   . CKD (chronic kidney disease) stage 3, GFR 30-59 ml/min   . Degenerative arthritis   . Depression   . Dyslipidemia   . Gastroesophageal reflux disease   . Gastroparesis diabeticorum ([place])   . Hematuria, microscopic 10/09   work up negative (Dr. Amalia Hailey)  . HIV positive ([place])   . 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 Hypothyroidism   . IDDM (insulin dependent diabetes mellitus) ([place])    38 years  . Low HDL (under 40)   . Proteinuria   . Retinopathy    x2  . SIRS (systemic inflammatory response syndrome) (H[facility])     Past Surgical History:  Procedure Laterality Date  . CATARACT EXTRACTION Right   . COLONOSCOPY    . ESOPHAGOGASTRODUODENOSCOPY    . EYE SURGERY  2006,2001   x2   . HIP ARTHROPLASTY Right 05/10/2016   Procedure: RIGHT HIP HEMIARTHROPLASTY;  Surgeon: Marchia Bond, MD;  Location: [place];  Service: Orthopedics;  Laterality: Right;  . insulin pump    . LASIK Bilateral   . VIDEO BRONCHOSCOPY Bilateral  12/15/2012   Procedure: VIDEO BRONCHOSCOPY WITH FLUORO;  Surgeon: Kathee Delton, MD;  [facility];  Service: Cardiopulmonary;  Laterality: Bilateral;  . VITRECTOMY  bilateral    There were no vitals filed for this visit.      Subjective Assessment - 06/12/16 1211    Subjective Patient reports that he had some fatigue and discomfort the next day following evaluation.   Patient Stated Goals Get back to normal.   Currently in Pain? Other (Comment)  No pain assessment provided by patient            [facility] PT Assessment - 06/12/16 0001      Assessment   Medical Diagnosis Left hip hemiarthroplasty.   Onset Date/Surgical Date 05/10/16   Next MD Visit 07/03/2016     Restrictions   Weight Bearing Restrictions No                     [facility] Adult PT Treatment/Exercise - 06/12/16 0001      Knee/Hip Exercises: Aerobic   Nustep L4 x10 min     Knee/Hip Exercises: Standing   Heel Raises Both;2 sets;10 reps   Hip Abduction AROM;Right;2 sets;10 reps;Knee straight   Lateral Step Up Right;2 sets;10 reps;Hand Hold: 2;Step Height: 6"   Forward Step Up Right;2 sets;10 reps;Hand Hold: 2;Step Height: 6"   Functional Squat 2 sets;10 reps  VCs and demo for proper technique  Knee/Hip Exercises: Seated   Long Arc Quad Strengthening;Right;2 sets;10 reps;Weights   Long Arc Quad Weight 4 lbs.     Knee/Hip Exercises: Supine   Heel Slides AROM;Right;2 sets;10 reps   Straight Leg Raises AROM;Right;2 sets;10 reps     Manual Therapy   Manual Therapy Passive ROM   Passive ROM PROM of R hip into flexion (less than 90 deg) and less than 40 deg abduction                     PT Long Term Goals - 06/10/16 1903      PT LONG TERM GOAL #1   Title Independent with a HEP.   Time 6   Period Weeks   Status New     PT LONG TERM GOAL #2   Title Increase left hip strength to a solid 5/5 to increase stability for functional activites.   Time 6   Period Weeks    Status New     PT LONG TERM GOAL #3   Title Perform a reciprocating stair gait.   Time 6   Period Weeks   Status New     PT LONG TERM GOAL #4   Title Walk without deviation.   Time 6   Period Weeks   Status New     PT LONG TERM GOAL #5   Title Perform ADL's with pain not > 3/10.   Time 6   Period Weeks   Status New               Plan - 06/12/16 1223    Clinical Impression Statement Patient tolerated today's treatment well with gentle R hip strengthening exercises per protocol. Patient continues to experience R hip weakness as his main complaint but also has limitations with ROM. Patient reported only "feeling it" with exercises today although SLR sensation was greater. Patient compliant with hip precautions as he as able to recite them in clinic. Tight ROM noted as PROM of R hip into flexion approached 80-90 deg. Minimal tightness palpated along patient's R ITB.   Rehab Potential Excellent   PT Frequency 2x / week   PT Duration 6 weeks   PT Treatment/Interventions ADLs/Self Care Home Management;Cryotherapy;Electrical Stimulation;Moist Heat;Stair training;Gait training;Functional mobility training;Therapeutic activities;Therapeutic exercise;Neuromuscular re-education;Manual techniques;Patient/family education   PT Next Visit Plan Left hip strengtehning; Nustep; gait activites; parallel bar activities.   Consulted and Agree with Plan of Care Patient      Patient will benefit from skilled therapeutic intervention in order to improve the following deficits and impairments:  Abnormal gait, Decreased activity tolerance, Decreased strength, Pain  Visit Diagnosis: Pain in left hip  Muscle weakness (generalized)     Problem List Patient Active Problem List   Diagnosis Date Noted  . Fever 05/14/2016  . S/P ORIF (open reduction internal fixation) fracture   . Closed right hip fracture, initial encounter ([place]) 05/09/2016  . Benign prostatic hyperplasia without lower  urinary tract symptoms 04/18/2016  . Vitamin D deficiency 04/18/2016  . OSA (obstructive sleep apnea) 10/06/2014  . Screening examination for venereal disease 12/23/2013  . Pseudophakia of both eyes 06/23/2013  . Pain in joint, lower leg 11/19/2012  . Unspecified sinusitis (chronic) 09/16/2012  . Right groin wound 08/09/2012  . HIV disease ([place]) 06/02/2012  . Convulsions/seizures ([place]) 05/16/2012  . SIRS (systemic inflammatory response syndrome) ([place]) 04/18/2012  . Pulmonary infiltrate 04/18/2012  . Gastroparesis 04/18/2012  . Anemia, iron deficiency   . Type 1 diabetes mellitus (H[facility])  05/08/2011  . Acquired hypothyroidism 05/08/2011  . Chronic kidney disease, stage 3, mod decreased GFR 12/31/2010    Wynelle Fanny, PTA 06/12/2016, 12:27 PM  [facility] [place], [place], [address] Phone: [phone]   Fax:  [phone]  Name: Patrick Brown MRN: [id number] Date of Birth: [birth year]

## 2016-06-18 ENCOUNTER — Ambulatory Visit: Payer: Worker's Compensation | Admitting: Physical Therapy

## 2016-06-18 ENCOUNTER — Encounter: Payer: Self-pay | Admitting: Physical Therapy

## 2016-06-18 DIAGNOSIS — M25552 Pain in left hip: Secondary | ICD-10-CM

## 2016-06-18 DIAGNOSIS — M6281 Muscle weakness (generalized): Secondary | ICD-10-CM

## 2016-06-18 NOTE — Patient Instructions (Addendum)
Strengthening: Straight Leg Raise (Phase 1)    Tighten muscles on front of right thigh, then lift leg __6__ inches from surface, keeping knee locked.  Repeat __10__ times per set. Do __2__ sets per session. Do _2-3___ sessions per day.  http://orth.exer.us/614   Copyright  VHI. All rights reserved.  Strengthening: Hip Abduction - Resisted    With tubing around right leg, other side toward anchor, extend leg out from side. Repeat __10__ times per set. Do __2__ sets per session. Do _2-3___ sessions per day.  http://orth.exer.us/634   Copyright  VHI. All rights reserved.  Bridging    Slowly raise buttocks from floor, keeping stomach tight. Repeat _10___ times per set. Do __2__ sets per session. Do __2-3__ sessions per day.  http://orth.exer.us/1096   Copyright  VHI. All rights reserved.  Functional Quadriceps: Sit to Stand    Sit on edge of chair, feet flat on floor. Stand upright, extending knees fully. Repeat __10__ times per set. Do __2__ sets per session. Do _2-3___ sessions per day.  http://orth.exer.us/734   Copyright  VHI. All rights reserved.  Functional Quadriceps: Chair Squat    Keeping feet flat on floor, shoulder width apart, squat as low as is comfortable. Use support as necessary. Repeat __10__ times per set. Do ___2_ sets per session. Do __2-3__ sessions per day.  http://orth.exer.us/736   Copyright  VHI. All rights reserved.

## 2016-06-18 NOTE — Therapy (Signed)
[facility] [place], [place], [address] Phone: [phone]   Fax:  [phone]  Physical Therapy Treatment  Patient Details  Name: Patrick Brown MRN: [id number] Date of Birth: 03-Mar-1970 Referring Provider: Marchia Bond MD  Encounter Date: 06/18/2016      PT End of Session - 06/18/16 1307    Visit Number 3   Number of Visits 12   Date for PT Re-Evaluation 07/22/16   PT Start Time 4818   PT Stop Time 1345   PT Time Calculation (min) 42 min   Activity Tolerance Patient tolerated treatment well   Behavior During Therapy Patrick Brown for tasks assessed/performed      Past Medical History:  Diagnosis Date  . Anemia, iron deficiency On procrit  . CAP (community acquired pneumonia)   . CKD (chronic kidney disease) stage 3, GFR 30-59 ml/min   . Degenerative arthritis   . Depression   . Dyslipidemia   . Gastroesophageal reflux disease   . Gastroparesis diabeticorum ([place])   . Hematuria, microscopic 10/09   work up negative (Dr. Amalia Brown)  . HIV positive (Patrick Brown)   . 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 Hypothyroidism   . IDDM (insulin dependent diabetes mellitus) ([place])    38 years  . Low HDL (under 40)   . Proteinuria   . Retinopathy    x2  . SIRS (systemic inflammatory response syndrome) (H[facility])     Past Surgical History:  Procedure Laterality Date  . CATARACT EXTRACTION Right   . COLONOSCOPY    . ESOPHAGOGASTRODUODENOSCOPY    . EYE SURGERY  2006,2001   x2   . HIP ARTHROPLASTY Right 05/10/2016   Procedure: RIGHT HIP HEMIARTHROPLASTY;  Surgeon: Patrick Bond, MD;  Location: [place];  Service: Orthopedics;  Laterality: Right;  . insulin pump    . LASIK Bilateral   . VIDEO BRONCHOSCOPY Bilateral  12/15/2012   Procedure: VIDEO BRONCHOSCOPY WITH FLUORO;  Surgeon: Patrick Delton, MD;  [facility];  Service: Cardiopulmonary;  Laterality: Bilateral;  . VITRECTOMY  bilateral    There were no vitals filed for this visit.      Subjective Assessment - 06/18/16 1306    Subjective Reports that he went to [employer] last night and stood on concrete floors and his leg is really tired feeling today. Reports that he did have to take a muscle relaxer last night.   Patient Stated Goals Get back to normal.   Currently in Pain? Yes   Pain Score 4    Pain Location Hip   Pain Orientation Right   Pain Descriptors / Indicators Tiring   Pain Type Acute pain   Pain Onset 1 to 4 weeks ago            [facility] PT Assessment - 06/18/16 0001      Assessment   Medical Diagnosis Left hip hemiarthroplasty.   Onset Date/Surgical Date 05/10/16   Next MD Visit 07/03/2016     Restrictions   Weight Bearing Restrictions No                     [facility] Adult PT Treatment/Exercise - 06/18/16 0001      Knee/Hip Exercises: Aerobic   Nustep L4 x10 min,sesat 13     Knee/Hip Exercises: Standing  Hip Abduction AROM;Right;2 sets;10 reps;Knee straight   Lateral Step Up Right;3 sets;10 reps;Hand Hold: 2;Step Height: 6"   Forward Step Up Right;3 sets;10 reps;Hand Hold: 2;Step Height: 6"   Step Down Both;2 sets;10 reps;Hand Hold: 2;Step Height: 6"   Rocker Board 3 minutes     Knee/Hip Exercises: Seated   Long Arc Quad Strengthening;Right;3 sets;10 reps;Weights   Long Arc Quad Weight 4 lbs.     Knee/Hip Exercises: Supine   Bridges Limitations 2x10 reps   Straight Leg Raises AROM;Right;1 set;15 reps     Manual Therapy   Manual Therapy Passive ROM   Passive ROM PROM of R hip into flexion (less than or equal to 90 deg)                PT Education - 06/18/16 1346    Education provided Yes   Education Details HEP- SLR, standing hip abduction, bridging, sit to stand, squat  with chair   Person(s) Educated Patient   Methods Explanation;Verbal cues;Handout   Comprehension Verbalized understanding;Verbal cues required             PT Long Term Goals - 06/10/16 1903      PT LONG TERM GOAL #1   Title Independent with a HEP.   Time 6   Period Weeks   Status New     PT LONG TERM GOAL #2   Title Increase left hip strength to a solid 5/5 to increase stability for functional activites.   Time 6   Period Weeks   Status New     PT LONG TERM GOAL #3   Title Perform a reciprocating stair gait.   Time 6   Period Weeks   Status New     PT LONG TERM GOAL #4   Title Walk without deviation.   Time 6   Period Weeks   Status New     PT LONG TERM GOAL #5   Title Perform ADL's with pain not > 3/10.   Time 6   Period Weeks   Status New               Plan - 06/18/16 1351    Clinical Impression Statement Patient tolerated today's treatment well although he arrived with R hip fatigue from standing for prolonger period of time on concrete floors. Patient able to still do all exercises in standing and supine although limited with SLR due to fatigue. Step ups and step downs completed to strengthening and progress stabilization functionally. No tightness at end range noted with PROM of R hip into flexion to 90 deg today. Patient provided HEP to focus on strengthening as well as ROM with squats to chair. Patient educated regarding technique and parameters with patient verbalizing understanding of instruction.   Rehab Potential Excellent   PT Frequency 2x / week   PT Duration 6 weeks   PT Treatment/Interventions ADLs/Self Care Home Management;Cryotherapy;Electrical Stimulation;Moist Heat;Stair training;Gait training;Functional mobility training;Therapeutic activities;Therapeutic exercise;Neuromuscular re-education;Manual techniques;Patient/family education   PT Next Visit Plan Continue with RLE strengthening with gait training as needed per MPT POC.   PT Home  Exercise Plan HEP- SLR, standing hip abduction, bridging, sit to stands, squats to chair   Consulted and Agree with Plan of Care Patient      Patient will benefit from skilled therapeutic intervention in order to improve the following deficits and impairments:  Abnormal gait, Decreased activity tolerance, Decreased strength, Pain  Visit Diagnosis: Pain in left hip  Muscle weakness (generalized)  Problem List Patient Active Problem List   Diagnosis Date Noted  . Fever 05/14/2016  . S/P ORIF (open reduction internal fixation) fracture   . Closed right hip fracture, initial encounter ([place]) 05/09/2016  . Benign prostatic hyperplasia without lower urinary tract symptoms 04/18/2016  . Vitamin D deficiency 04/18/2016  . OSA (obstructive sleep apnea) 10/06/2014  . Screening examination for venereal disease 12/23/2013  . Pseudophakia of both eyes 06/23/2013  . Pain in joint, lower leg 11/19/2012  . Unspecified sinusitis (chronic) 09/16/2012  . Right groin wound 08/09/2012  . HIV disease ([place]) 06/02/2012  . Convulsions/seizures ([place]) 05/16/2012  . SIRS (systemic inflammatory response syndrome) ([place]) 04/18/2012  . Pulmonary infiltrate 04/18/2012  . Gastroparesis 04/18/2012  . Anemia, iron deficiency   . Type 1 diabetes mellitus ([place]) 05/08/2011  . Acquired hypothyroidism 05/08/2011  . Chronic kidney disease, stage 3, mod decreased GFR 12/31/2010    Wynelle Fanny, PTA 06/18/2016, 2:06 PM  [facility] [place], [place], [address] Phone: [phone]   Fax:  [phone]  Name: Patrick Brown MRN: [id number] Date of Birth: October 09, 1970

## 2016-06-19 ENCOUNTER — Other Ambulatory Visit: Payer: Medicare Other

## 2016-06-19 DIAGNOSIS — N183 Chronic kidney disease, stage 3 unspecified: Secondary | ICD-10-CM

## 2016-06-19 LAB — BMP8+EGFR
BUN/Creatinine Ratio: 20 (ref 9–20)
BUN: 34 mg/dL — ABNORMAL HIGH (ref 6–24)
CALCIUM: 10 mg/dL (ref 8.7–10.2)
CO2: 27 mmol/L (ref 18–29)
CREATININE: 1.68 mg/dL — AB (ref 0.76–1.27)
Chloride: 95 mmol/L — ABNORMAL LOW (ref 96–106)
GFR, EST AFRICAN AMERICAN: 55 mL/min/{1.73_m2} — AB (ref >59)
GFR, EST NON AFRICAN AMERICAN: 48 mL/min/{1.73_m2} — AB (ref >59)
Glucose: 212 mg/dL — ABNORMAL HIGH (ref 65–99)
Potassium: 5.4 mmol/L — ABNORMAL HIGH (ref 3.5–5.2)
Sodium: 139 mmol/L (ref 134–144)

## 2016-06-20 ENCOUNTER — Ambulatory Visit: Payer: Worker's Compensation | Admitting: Physical Therapy

## 2016-06-20 ENCOUNTER — Other Ambulatory Visit: Payer: Self-pay | Admitting: *Deleted

## 2016-06-20 DIAGNOSIS — M25552 Pain in left hip: Secondary | ICD-10-CM

## 2016-06-20 DIAGNOSIS — E875 Hyperkalemia: Secondary | ICD-10-CM

## 2016-06-20 DIAGNOSIS — M6281 Muscle weakness (generalized): Secondary | ICD-10-CM

## 2016-06-20 NOTE — Therapy (Signed)
[facility] [place], [place], [address] Phone: [phone]   Fax:  [phone]  Physical Therapy Treatment  Patient Details  Name: Patrick Brown MRN: [id number] Date of Birth: Nov 10, 1970 Referring Provider: Marchia Bond MD  Encounter Date: 06/20/2016      PT End of Session - 06/20/16 1407    Visit Number 4   Number of Visits 12   Date for PT Re-Evaluation 07/22/16   PT Start Time 0145   PT Stop Time 0231   PT Time Calculation (min) 46 min   Activity Tolerance Patient tolerated treatment well   Behavior During Therapy [facility] for tasks assessed/performed      Past Medical History:  Diagnosis Date  . Anemia, iron deficiency On procrit  . CAP (community acquired pneumonia)   . CKD (chronic kidney disease) stage 3, GFR 30-59 ml/min   . Degenerative arthritis   . Depression   . Dyslipidemia   . Gastroesophageal reflux disease   . Gastroparesis diabeticorum ([place])   . Hematuria, microscopic 10/09   work up negative (Dr. Amalia Brown)  . HIV positive ([place])   . 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 Hypothyroidism   . IDDM (insulin dependent diabetes mellitus) ([place])    38 years  . Low HDL (under 40)   . Proteinuria   . Retinopathy    x2  . SIRS (systemic inflammatory response syndrome) (H[facility])     Past Surgical History:  Procedure Laterality Date  . CATARACT EXTRACTION Right   . COLONOSCOPY    . ESOPHAGOGASTRODUODENOSCOPY    . EYE SURGERY  2006,2001   x2   . HIP ARTHROPLASTY Right 05/10/2016   Procedure: RIGHT HIP HEMIARTHROPLASTY;  Surgeon: Patrick Bond, MD;  Location: [facility];  Service: Orthopedics;  Laterality: Right;  . insulin pump    . LASIK Bilateral   . VIDEO BRONCHOSCOPY Bilateral  12/15/2012   Procedure: VIDEO BRONCHOSCOPY WITH FLUORO;  Surgeon: Patrick Delton, MD;  [facility];  Service: Cardiopulmonary;  Laterality: Bilateral;  . VITRECTOMY  bilateral    There were no vitals filed for this visit.                       [facility] Adult PT Treatment/Exercise - 06/20/16 0001      Neuro Re-ed    Neuro Re-ed Details  Inverted BOSU ball in parallel bars x 5 minutes.     Exercises   Exercises Ankle     Knee/Hip Exercises: Aerobic   Nustep Level 6 x 15 minutes.     Knee/Hip Exercises: Supine   Quad Sets Limitations 5# x 5 minutes.   Other Supine Knee/Hip Exercises Assisted right SLR's to fatigue x 2.     Knee/Hip Exercises: Sidelying   Hip ABduction Limitations Left sdly position with folded pillow between knees to avoid crossing mid-line:  Assisted right hip abduction 2 fatigue x 2.     Ankle Exercises: Standing   Other Standing Ankle Exercises Rockerboard in parallel bars x 5 minutes.                     PT Long Term Goals - 06/10/16 1903      PT LONG TERM GOAL #1   Title Independent with a HEP.   Time  6   Period Weeks   Status New     PT LONG TERM GOAL #2   Title Increase left hip strength to a solid 5/5 to increase stability for functional activites.   Time 6   Period Weeks   Status New     PT LONG TERM GOAL #3   Title Perform a reciprocating stair gait.   Time 6   Period Weeks   Status New     PT LONG TERM GOAL #4   Title Walk without deviation.   Time 6   Period Weeks   Status New     PT LONG TERM GOAL #5   Title Perform ADL's with pain not > 3/10.   Time 6   Period Weeks   Status New               Plan - 06/20/16 1433    Clinical Impression Statement Patient did well today.  His gait has improved significantly even though he still has a decreased step and stride length.      Patient will benefit from skilled therapeutic intervention in order to improve the following deficits and  impairments:  Abnormal gait, Decreased activity tolerance, Decreased strength, Pain  Visit Diagnosis: Pain in left hip  Muscle weakness (generalized)     Problem List Patient Active Problem List   Diagnosis Date Noted  . Fever 05/14/2016  . S/P ORIF (open reduction internal fixation) fracture   . Closed right hip fracture, initial encounter ([place]) 05/09/2016  . Benign prostatic hyperplasia without lower urinary tract symptoms 04/18/2016  . Vitamin D deficiency 04/18/2016  . OSA (obstructive sleep apnea) 10/06/2014  . Screening examination for venereal disease 12/23/2013  . Pseudophakia of both eyes 06/23/2013  . Pain in joint, lower leg 11/19/2012  . Unspecified sinusitis (chronic) 09/16/2012  . Right groin wound 08/09/2012  . HIV disease ([place]) 06/02/2012  . Convulsions/seizures ([place]) 05/16/2012  . SIRS (systemic inflammatory response syndrome) ([place]) 04/18/2012  . Pulmonary infiltrate 04/18/2012  . Gastroparesis 04/18/2012  . Anemia, iron deficiency   . Type 1 diabetes mellitus ([place]) 05/08/2011  . Acquired hypothyroidism 05/08/2011  . Chronic kidney disease, stage 3, mod decreased GFR 12/31/2010    Patrick Brown, Patrick Brown 06/20/2016, 2:37 PM  [facility] [address] [place], [place], [address] Phone: [phone]   Fax:  [phone]  Name: Patrick Brown MRN: [id number] Date of Birth: 21-Mar-1970

## 2016-06-21 ENCOUNTER — Other Ambulatory Visit: Payer: Self-pay | Admitting: Family Medicine

## 2016-06-25 ENCOUNTER — Ambulatory Visit: Payer: Worker's Compensation | Admitting: *Deleted

## 2016-06-25 DIAGNOSIS — M25552 Pain in left hip: Secondary | ICD-10-CM | POA: Diagnosis not present

## 2016-06-25 DIAGNOSIS — M6281 Muscle weakness (generalized): Secondary | ICD-10-CM

## 2016-06-25 NOTE — Therapy (Signed)
[facility] [place], [place], [address] Phone: [phone]   Fax:  [phone]  Physical Therapy Treatment  Patient Details  Name: Patrick Brown MRN: [id number] Date of Birth: 02-17-1970 Referring Provider: Marchia Bond MD  Encounter Date: 06/25/2016      PT End of Session - 06/25/16 1545    Visit Number 5   Number of Visits 12   Date for PT Re-Evaluation 07/22/16   PT Start Time 7412   PT Stop Time 1431   PT Time Calculation (min) 46 min      Past Medical History:  Diagnosis Date  . Anemia, iron deficiency On procrit  . CAP (community acquired pneumonia)   . CKD (chronic kidney disease) stage 3, GFR 30-59 ml/min   . Degenerative arthritis   . Depression   . Dyslipidemia   . Gastroesophageal reflux disease   . Gastroparesis diabeticorum ([place])   . Hematuria, microscopic 10/09   work up negative (Dr. Amalia Hailey)  . HIV positive ([place])   . 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 Hypothyroidism   . IDDM (insulin dependent diabetes mellitus) ([place])    38 years  . Low HDL (under 40)   . Proteinuria   . Retinopathy    x2  . SIRS (systemic inflammatory response syndrome) (H[facility])     Past Surgical History:  Procedure Laterality Date  . CATARACT EXTRACTION Right   . COLONOSCOPY    . ESOPHAGOGASTRODUODENOSCOPY    . EYE SURGERY  2006,2001   x2   . HIP ARTHROPLASTY Right 05/10/2016   Procedure: RIGHT HIP HEMIARTHROPLASTY;  Surgeon: Marchia Bond, MD;  Location: [facility];  Service: Orthopedics;  Laterality: Right;  . insulin pump    . LASIK Bilateral   . VIDEO BRONCHOSCOPY Bilateral 12/15/2012   Procedure: VIDEO BRONCHOSCOPY WITH FLUORO;  Surgeon: Kathee Delton, MD;  [facility];  Service:  Cardiopulmonary;  Laterality: Bilateral;  . VITRECTOMY  bilateral    There were no vitals filed for this visit.      Subjective Assessment - 06/25/16 1349    Subjective Reports that he went to [employer] last night and stood on concrete floors and his leg is really tired feeling today. Reports that he did have to take a muscle relaxer last night.                         [facility] Adult PT Treatment/Exercise - 06/25/16 0001      Neuro Re-ed    Neuro Re-ed Details  Inverted BOSU ball in parallel bars x 5 minutes.     Exercises   Exercises Ankle     Knee/Hip Exercises: Aerobic   Nustep Level 6 x 15 minutes.     Knee/Hip Exercises: Standing   Hip Flexion AROM;3 sets;10 reps   Hip Abduction Both;3 sets;10 reps   Lateral Step Up Right;3 sets;10 reps;Hand Hold: 2;Step Height: 6"   Forward Step Up Right;3 sets;10 reps;Hand Hold: 2;Step Height: 6"   Rocker Board 5 minutes     Knee/Hip Exercises: Seated   Long Arc Quad Strengthening;Right;3 sets;10 reps;Weights   Long Arc Quad Weight 4 lbs.                     PT Long  Term Goals - 06/10/16 1903      PT LONG TERM GOAL #1   Title Independent with a HEP.   Time 6   Period Weeks   Status New     PT LONG TERM GOAL #2   Title Increase left hip strength to a solid 5/5 to increase stability for functional activites.   Time 6   Period Weeks   Status New     PT LONG TERM GOAL #3   Title Perform a reciprocating stair gait.   Time 6   Period Weeks   Status New     PT LONG TERM GOAL #4   Title Walk without deviation.   Time 6   Period Weeks   Status New     PT LONG TERM GOAL #5   Title Perform ADL's with pain not > 3/10.   Time 6   Period Weeks   Status New               Plan - 06/25/16 1546    Clinical Impression Statement Pt arrived today feeling that his RT LE is getting stronger and his hipis mainly just sore. His gait continue to improve and he was able to complete all exs  with mainly fatigue at the end of Rx.   Rehab Potential Excellent   PT Frequency 2x / week   PT Duration 6 weeks   PT Treatment/Interventions ADLs/Self Care Home Management;Cryotherapy;Electrical Stimulation;Moist Heat;Stair training;Gait training;Functional mobility training;Therapeutic activities;Therapeutic exercise;Neuromuscular re-education;Manual techniques;Patient/family education   PT Next Visit Plan Continue with RLE strengthening with gait training as needed per MPT POC.   PT Home Exercise Plan HEP- SLR, standing hip abduction, bridging, sit to stands, squats to chair   Consulted and Agree with Plan of Care Patient      Patient will benefit from skilled therapeutic intervention in order to improve the following deficits and impairments:  Abnormal gait, Decreased activity tolerance, Decreased strength, Pain  Visit Diagnosis: Pain in left hip  Muscle weakness (generalized)     Problem List Patient Active Problem List   Diagnosis Date Noted  . Fever 05/14/2016  . S/P ORIF (open reduction internal fixation) fracture   . Closed right hip fracture, initial encounter ([place]) 05/09/2016  . Benign prostatic hyperplasia without lower urinary tract symptoms 04/18/2016  . Vitamin D deficiency 04/18/2016  . OSA (obstructive sleep apnea) 10/06/2014  . Screening examination for venereal disease 12/23/2013  . Pseudophakia of both eyes 06/23/2013  . Pain in joint, lower leg 11/19/2012  . Unspecified sinusitis (chronic) 09/16/2012  . Right groin wound 08/09/2012  . HIV disease ([place]) 06/02/2012  . Convulsions/seizures ([place]) 05/16/2012  . SIRS (systemic inflammatory response syndrome) ([place]) 04/18/2012  . Pulmonary infiltrate 04/18/2012  . Gastroparesis 04/18/2012  . Anemia, iron deficiency   . Type 1 diabetes mellitus ([place]) 05/08/2011  . Acquired hypothyroidism 05/08/2011  . Chronic kidney disease, stage 3, mod decreased GFR 12/31/2010    Elya Diloreto,CHRIS, PTA 06/25/2016, 4:08  PM  [facility] [address] [place], [place], [address] Phone: [phone]   Fax:  [phone]  Name: Demontre Padin MRN: [id number] Date of Birth: Apr 05, 1970

## 2016-06-27 ENCOUNTER — Ambulatory Visit: Payer: Worker's Compensation | Admitting: Physical Therapy

## 2016-06-27 DIAGNOSIS — M25552 Pain in left hip: Secondary | ICD-10-CM

## 2016-06-27 DIAGNOSIS — M6281 Muscle weakness (generalized): Secondary | ICD-10-CM

## 2016-06-27 NOTE — Therapy (Signed)
[facility] [place], [place], [address] Phone: [phone]   Fax:  [phone]  Physical Therapy Treatment  Patient Details  Name: Patrick Brown MRN: [id number] Date of Birth: 25-Jun-1970 Referring Provider: Marchia Bond MD  Encounter Date: 06/27/2016      PT End of Session - 06/27/16 1410    Visit Number 6   Number of Visits 12   Date for PT Re-Evaluation 07/22/16   PT Start Time 0146      Past Medical History:  Diagnosis Date  . Anemia, iron deficiency On procrit  . CAP (community acquired pneumonia)   . CKD (chronic kidney disease) stage 3, GFR 30-59 ml/min   . Degenerative arthritis   . Depression   . Dyslipidemia   . Gastroesophageal reflux disease   . Gastroparesis diabeticorum ([place])   . Hematuria, microscopic 10/09   work up negative (Dr. Amalia Hailey)  . HIV positive ([place])   . 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 Hypothyroidism   . IDDM (insulin dependent diabetes mellitus) ([place])    38 years  . Low HDL (under 40)   . Proteinuria   . Retinopathy    x2  . SIRS (systemic inflammatory response syndrome) ([facility])     Past Surgical History:  Procedure Laterality Date  . CATARACT EXTRACTION Right   . COLONOSCOPY    . ESOPHAGOGASTRODUODENOSCOPY    . EYE SURGERY  2006,2001   x2   . HIP ARTHROPLASTY Right 05/10/2016   Procedure: RIGHT HIP HEMIARTHROPLASTY;  Surgeon: Marchia Bond, MD;  Location: [facility];  Service: Orthopedics;  Laterality: Right;  . insulin pump    . LASIK Bilateral   . VIDEO BRONCHOSCOPY Bilateral 12/15/2012   Procedure: VIDEO BRONCHOSCOPY WITH FLUORO;  Surgeon: Kathee Delton, MD;  [facility];  Service: Cardiopulmonary;  Laterality: Bilateral;  . VITRECTOMY   bilateral    There were no vitals filed for this visit.      Subjective Assessment - 06/27/16 1410    Subjective I doing well today.   Patient Stated Goals Get back to normal.   Pain Score 4    Pain Location Hip   Pain Orientation Right   Pain Descriptors / Indicators Tiring   Pain Type Acute pain   Pain Onset 1 to 4 weeks ago                         [facility] Adult PT Treatment/Exercise - 06/27/16 0001      Neuro Re-ed    Neuro Re-ed Details  Rockerboard and inverted BOS0 ball in parallel bars x 10 minutes total.     Exercises   Exercises Knee/Hip     Knee/Hip Exercises: Aerobic   Nustep Level 5 x 20 minutes.     Knee/Hip Exercises: Supine   Quad Sets Limitations 7 1/2# x 5 minutes.     Knee/Hip Exercises: Sidelying   Hip ABduction Limitations Left sdly position;  2 pillows beteen knees for comfort.  2 sets to fatigue.                     PT Long Term Goals - 06/10/16 1903      PT LONG TERM GOAL #1   Title Independent with a HEP.  Time 6   Period Weeks   Status New     PT LONG TERM GOAL #2   Title Increase left hip strength to a solid 5/5 to increase stability for functional activites.   Time 6   Period Weeks   Status New     PT LONG TERM GOAL #3   Title Perform a reciprocating stair gait.   Time 6   Period Weeks   Status New     PT LONG TERM GOAL #4   Title Walk without deviation.   Time 6   Period Weeks   Status New     PT LONG TERM GOAL #5   Title Perform ADL's with pain not > 3/10.   Time 6   Period Weeks   Status New               Plan - 06/27/16 1428    Clinical Impression Statement Patient is progressing very well toward goals.  Ambulating without an assistive device.      Patient will benefit from skilled therapeutic intervention in order to improve the following deficits and impairments:  Abnormal gait, Decreased activity tolerance, Decreased strength, Pain  Visit Diagnosis: Pain in left  hip  Muscle weakness (generalized)     Problem List Patient Active Problem List   Diagnosis Date Noted  . Fever 05/14/2016  . S/P ORIF (open reduction internal fixation) fracture   . Closed right hip fracture, initial encounter ([place]) 05/09/2016  . Benign prostatic hyperplasia without lower urinary tract symptoms 04/18/2016  . Vitamin D deficiency 04/18/2016  . OSA (obstructive sleep apnea) 10/06/2014  . Screening examination for venereal disease 12/23/2013  . Pseudophakia of both eyes 06/23/2013  . Pain in joint, lower leg 11/19/2012  . Unspecified sinusitis (chronic) 09/16/2012  . Right groin wound 08/09/2012  . HIV disease ([place]) 06/02/2012  . Convulsions/seizures ([place]) 05/16/2012  . SIRS (systemic inflammatory response syndrome) ([place]) 04/18/2012  . Pulmonary infiltrate 04/18/2012  . Gastroparesis 04/18/2012  . Anemia, iron deficiency   . Type 1 diabetes mellitus ([place]) 05/08/2011  . Acquired hypothyroidism 05/08/2011  . Chronic kidney disease, stage 3, mod decreased GFR 12/31/2010    Leovardo Thoman, [place]  MPT 06/27/2016, 2:42 PM  [facility] [address] [place], [place], [address] Phone: [phone]   Fax:  [phone]  Name: Patrick Brown MRN: [id number] Date of Birth: Aug 09, 1970

## 2016-07-02 ENCOUNTER — Ambulatory Visit: Payer: Worker's Compensation | Admitting: *Deleted

## 2016-07-02 ENCOUNTER — Other Ambulatory Visit: Payer: Self-pay | Admitting: Family Medicine

## 2016-07-02 DIAGNOSIS — M6281 Muscle weakness (generalized): Secondary | ICD-10-CM

## 2016-07-02 DIAGNOSIS — G4733 Obstructive sleep apnea (adult) (pediatric): Secondary | ICD-10-CM | POA: Diagnosis not present

## 2016-07-02 DIAGNOSIS — R0902 Hypoxemia: Secondary | ICD-10-CM | POA: Diagnosis not present

## 2016-07-02 DIAGNOSIS — J9690 Respiratory failure, unspecified, unspecified whether with hypoxia or hypercapnia: Secondary | ICD-10-CM | POA: Diagnosis not present

## 2016-07-02 DIAGNOSIS — M25552 Pain in left hip: Secondary | ICD-10-CM | POA: Diagnosis not present

## 2016-07-02 NOTE — Therapy (Signed)
[facility] [place], [place], [address] Phone: [phone]   Fax:  [phone]  Physical Therapy Treatment  Patient Details  Name: Patrick Brown MRN: [id number] Date of Birth: 15-Mar-1970 Referring Provider: Marchia Bond MD  Encounter Date: 07/02/2016      PT End of Session - 07/02/16 1653    Visit Number 7   Number of Visits 12   Date for PT Re-Evaluation 07/22/16   PT Start Time 1600   PT Stop Time 1650   PT Time Calculation (min) 50 min      Past Medical History:  Diagnosis Date  . Anemia, iron deficiency On procrit  . CAP (community acquired pneumonia)   . CKD (chronic kidney disease) stage 3, GFR 30-59 ml/min   . Degenerative arthritis   . Depression   . Dyslipidemia   . Gastroesophageal reflux disease   . Gastroparesis diabeticorum ([place])   . Hematuria, microscopic 10/09   work up negative (Dr. Amalia Hailey)  . HIV positive ([place])   . 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 Hypothyroidism   . IDDM (insulin dependent diabetes mellitus) ([place])    38 years  . Low HDL (under 40)   . Proteinuria   . Retinopathy    x2  . SIRS (systemic inflammatory response syndrome) (H[facility])     Past Surgical History:  Procedure Laterality Date  . CATARACT EXTRACTION Right   . COLONOSCOPY    . ESOPHAGOGASTRODUODENOSCOPY    . EYE SURGERY  2006,2001   x2   . HIP ARTHROPLASTY Right 05/10/2016   Procedure: RIGHT HIP HEMIARTHROPLASTY;  Surgeon: Marchia Bond, MD;  Location: [facility];  Service: Orthopedics;  Laterality: Right;  . insulin pump    . LASIK Bilateral   . VIDEO BRONCHOSCOPY Bilateral 12/15/2012   Procedure: VIDEO BRONCHOSCOPY WITH FLUORO;  Surgeon: Kathee Delton, MD;  [facility];  Service:  Cardiopulmonary;  Laterality: Bilateral;  . VITRECTOMY  bilateral    There were no vitals filed for this visit.      Subjective Assessment - 07/02/16 1558    Subjective I doing well today.   Patient Stated Goals Get back to normal.   Currently in Pain? Yes   Pain Score 4    Pain Location Hip   Pain Orientation Right   Pain Descriptors / Indicators Tiring   Pain Type Acute pain   Pain Onset 1 to 4 weeks ago   Pain Frequency Intermittent                         [facility] Adult PT Treatment/Exercise - 07/02/16 0001      Neuro Re-ed    Neuro Re-ed Details  Rockerboard and inverted BOS0 ball in parallel bars x 10 minutes total.     Exercises   Exercises Knee/Hip     Knee/Hip Exercises: Aerobic   Nustep Level 6 x 20 minutes.     Knee/Hip Exercises: Standing   Hip Flexion AROM;3 sets;10 reps   Lateral Step Up Right;3 sets;10 reps;Hand Hold: 2;Step Height: 6"   Forward Step Up Right;3 sets;10 reps;Hand Hold: 2;Step Height: 6"   Rocker Board 5 minutes   Walking with Sports Cord XTS pink resisted walking to the Left. 2x10     Knee/Hip Exercises: Seated  Long Arc [employer] Strengthening;Right;3 sets;10 reps;Weights   Long Arc [employer] 4 lbs.                     PT Long Term Goals - 06/10/16 1903      PT LONG TERM GOAL #1   Title Independent with a HEP.   Time 6   Period Weeks   Status New     PT LONG TERM GOAL #2   Title Increase left hip strength to a solid 5/5 to increase stability for functional activites.   Time 6   Period Weeks   Status New     PT LONG TERM GOAL #3   Title Perform a reciprocating stair gait.   Time 6   Period Weeks   Status New     PT LONG TERM GOAL #4   Title Walk without deviation.   Time 6   Period Weeks   Status New     PT LONG TERM GOAL #5   Title Perform ADL's with pain not > 3/10.   Time 6   Period Weeks   Status New               Plan - 07/02/16 1656    Clinical Impression Statement Pt  did fairly well today and continues to gain strength in RT LE. He was able to meet LTG for stair negotiation today and is close to meeting other LTGs except for mild strength and balance  deficits.   Rehab Potential Excellent   PT Frequency 2x / week   PT Duration 6 weeks   PT Treatment/Interventions ADLs/Self Care Home Management;Cryotherapy;Electrical Stimulation;Moist Heat;Stair training;Gait training;Functional mobility training;Therapeutic activities;Therapeutic exercise;Neuromuscular re-education;Manual techniques;Patient/family education   PT Next Visit Plan Continue with RLE strengthening with gait training as needed per MPT POC.   To MD tomorrow. Send note   PT Home Exercise Plan HEP- SLR, standing hip abduction, bridging, sit to stands, squats to chair   Consulted and Agree with Plan of Care Patient      Patient will benefit from skilled therapeutic intervention in order to improve the following deficits and impairments:  Abnormal gait, Decreased activity tolerance, Decreased strength, Pain  Visit Diagnosis: Pain in left hip  Muscle weakness (generalized)     Problem List Patient Active Problem List   Diagnosis Date Noted  . Fever 05/14/2016  . S/P ORIF (open reduction internal fixation) fracture   . Closed right hip fracture, initial encounter ([place]) 05/09/2016  . Benign prostatic hyperplasia without lower urinary tract symptoms 04/18/2016  . Vitamin D deficiency 04/18/2016  . OSA (obstructive sleep apnea) 10/06/2014  . Screening examination for venereal disease 12/23/2013  . Pseudophakia of both eyes 06/23/2013  . Pain in joint, lower leg 11/19/2012  . Unspecified sinusitis (chronic) 09/16/2012  . Right groin wound 08/09/2012  . HIV disease ([place]) 06/02/2012  . Convulsions/seizures ([place]) 05/16/2012  . SIRS (systemic inflammatory response syndrome) ([place]) 04/18/2012  . Pulmonary infiltrate 04/18/2012  . Gastroparesis 04/18/2012  . Anemia, iron deficiency   . Type 1  diabetes mellitus ([place]) 05/08/2011  . Acquired hypothyroidism 05/08/2011  . Chronic kidney disease, stage 3, mod decreased GFR 12/31/2010    APPLEGATE, Mal[place], PTA 07/02/2016, 5:25 PM Mali Applegate MPT [facility] [address] [place], [place], [address] Phone: [phone]   Fax:  [phone]  Name: Patrick Brown MRN: [id number] Date of Birth: 11/28/1970

## 2016-07-04 ENCOUNTER — Ambulatory Visit: Payer: Worker's Compensation | Admitting: Physical Therapy

## 2016-07-04 DIAGNOSIS — M25552 Pain in left hip: Secondary | ICD-10-CM | POA: Diagnosis not present

## 2016-07-04 DIAGNOSIS — M6281 Muscle weakness (generalized): Secondary | ICD-10-CM

## 2016-07-04 NOTE — Therapy (Signed)
[facility] [place], [place], [address] Phone: [phone]   Fax:  [phone]  Physical Therapy Treatment  Patient Details  Name: Patrick Brown MRN: [id number] Date of Birth: 17-May-1970 Referring Provider: Marchia Bond MD  Encounter Date: 07/04/2016      PT End of Session - 07/04/16 1511    Activity Tolerance Patient tolerated treatment well   Behavior During Therapy [facility] for tasks assessed/performed      Past Medical History:  Diagnosis Date  . Anemia, iron deficiency On procrit  . CAP (community acquired pneumonia)   . CKD (chronic kidney disease) stage 3, GFR 30-59 ml/min   . Degenerative arthritis   . Depression   . Dyslipidemia   . Gastroesophageal reflux disease   . Gastroparesis diabeticorum ([place])   . Hematuria, microscopic 10/09   work up negative (Dr. Amalia Hailey)  . HIV positive ([place])   . 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 Hypothyroidism   . IDDM (insulin dependent diabetes mellitus) ([place])    38 years  . Low HDL (under 40)   . Proteinuria   . Retinopathy    x2  . SIRS (systemic inflammatory response syndrome) (H[facility])     Past Surgical History:  Procedure Laterality Date  . CATARACT EXTRACTION Right   . COLONOSCOPY    . ESOPHAGOGASTRODUODENOSCOPY    . EYE SURGERY  2006,2001   x2   . HIP ARTHROPLASTY Right 05/10/2016   Procedure: RIGHT HIP HEMIARTHROPLASTY;  Surgeon: Marchia Bond, MD;  Location: [facility];  Service: Orthopedics;  Laterality: Right;  . insulin pump    . LASIK Bilateral   . VIDEO BRONCHOSCOPY Bilateral 12/15/2012   Procedure: VIDEO BRONCHOSCOPY WITH FLUORO;  Surgeon: Kathee Delton, MD;  [facility];  Service: Cardiopulmonary;  Laterality: Bilateral;  .  VITRECTOMY  bilateral    There were no vitals filed for this visit.      Subjective Assessment - 07/04/16 1427    Subjective Dr. said I was doing good and would like to continue.   Patient Stated Goals Get back to normal.   Pain Score 2    Pain Location Hip   Pain Orientation Right   Pain Descriptors / Indicators Tiring   Pain Type Acute pain   Pain Onset More than a month ago   Pain Frequency Intermittent                         [facility] Adult PT Treatment/Exercise - 07/04/16 0001      Neuro Re-ed    Neuro Re-ed Details  Rockerboard and inverted BOSU x 10 minutes.     Exercises   Exercises Knee/Hip     Knee/Hip Exercises: Aerobic   Nustep Level 7 x 20 minutes.     Knee/Hip Exercises: Machines for Strengthening   Cybex Knee Extension Reclined with hips at 75 degrees of flexion:  10# x 5 minutes.     Knee/Hip Exercises: Standing   Wall Squat Limitations Wall slides on plexiglass 2 x 15 reps. to 45 degrees of knee flexion.                     PT Long Term Goals - 06/10/16 1903      PT LONG TERM GOAL #1  Title Independent with a HEP.   Time 6   Period Weeks   Status New     PT LONG TERM GOAL #2   Title Increase left hip strength to a solid 5/5 to increase stability for functional activites.   Time 6   Period Weeks   Status New     PT LONG TERM GOAL #3   Title Perform a reciprocating stair gait.   Time 6   Period Weeks   Status New     PT LONG TERM GOAL #4   Title Walk without deviation.   Time 6   Period Weeks   Status New     PT LONG TERM GOAL #5   Title Perform ADL's with pain not > 3/10.   Time 6   Period Weeks   Status New               Plan - 07/04/16 1418    Clinical Impression Statement patient is making excellent progress toward goals.  Patient returned to his surgeon this week and would like him to continue PT.      Patient will benefit from skilled therapeutic intervention in order to improve the  following deficits and impairments:  Abnormal gait, Decreased activity tolerance, Decreased strength, Pain  Visit Diagnosis: Pain in left hip - Plan: PT plan of care cert/re-cert  Muscle weakness (generalized) - Plan: PT plan of care cert/re-cert     Problem List Patient Active Problem List   Diagnosis Date Noted  . Fever 05/14/2016  . S/P ORIF (open reduction internal fixation) fracture   . Closed right hip fracture, initial encounter (Patrick Brown) 05/09/2016  . Benign prostatic hyperplasia without lower urinary tract symptoms 04/18/2016  . Vitamin D deficiency 04/18/2016  . OSA (obstructive sleep apnea) 10/06/2014  . Screening examination for venereal disease 12/23/2013  . Pseudophakia of both eyes 06/23/2013  . Pain in joint, lower leg 11/19/2012  . Unspecified sinusitis (chronic) 09/16/2012  . Right groin wound 08/09/2012  . HIV disease (Patrick Brown) 06/02/2012  . Convulsions/seizures (Patrick Brown) 05/16/2012  . SIRS (systemic inflammatory response syndrome) (Patrick Brown) 04/18/2012  . Pulmonary infiltrate 04/18/2012  . Gastroparesis 04/18/2012  . Anemia, iron deficiency   . Type 1 diabetes mellitus (Patrick Brown) 05/08/2011  . Acquired hypothyroidism 05/08/2011  . Chronic kidney disease, stage 3, mod decreased GFR 12/31/2010    Patrick Brown, Patrick Brown 07/04/2016, 3:22 PM  [facility] [address] [place], [place], [address] Phone: [phone]   Fax:  [phone]  Name: [name] MRN: [id number] Date of Birth: [birth year]

## 2016-07-09 ENCOUNTER — Other Ambulatory Visit: Payer: Medicare Other

## 2016-07-09 ENCOUNTER — Other Ambulatory Visit: Payer: Self-pay | Admitting: *Deleted

## 2016-07-09 ENCOUNTER — Ambulatory Visit: Payer: Worker's Compensation | Attending: Family Medicine | Admitting: Physical Therapy

## 2016-07-09 DIAGNOSIS — M25551 Pain in right hip: Secondary | ICD-10-CM | POA: Diagnosis present

## 2016-07-09 DIAGNOSIS — E875 Hyperkalemia: Secondary | ICD-10-CM | POA: Diagnosis not present

## 2016-07-09 DIAGNOSIS — M6281 Muscle weakness (generalized): Secondary | ICD-10-CM | POA: Diagnosis present

## 2016-07-09 DIAGNOSIS — M25552 Pain in left hip: Secondary | ICD-10-CM

## 2016-07-09 MED ORDER — INSULIN LISPRO 100 UNIT/ML ~~LOC~~ SOLN: SUBCUTANEOUS | 11 refills | Status: DC

## 2016-07-09 NOTE — Therapy (Signed)
[facility] [place], [place], [address] Phone: [phone]   Fax:  [phone]  Physical Therapy Treatment  Patient Details  Name: Patrick Brown MRN: [id number] Date of Birth: 1970-08-28 Referring Provider: Marchia Bond MD  Encounter Date: 07/09/2016      PT End of Session - 07/09/16 1517    Visit Number 10   Number of Visits 24   Date for PT Re-Evaluation 08/19/16   PT Start Time 0230   PT Stop Time 0317   PT Time Calculation (min) 47 min   Activity Tolerance Patient tolerated treatment well   Behavior During Therapy [facility] for tasks assessed/performed      Past Medical History:  Diagnosis Date  . Anemia, iron deficiency On procrit  . CAP (community acquired pneumonia)   . CKD (chronic kidney disease) stage 3, GFR 30-59 ml/min   . Degenerative arthritis   . Depression   . Dyslipidemia   . Gastroesophageal reflux disease   . Gastroparesis diabeticorum ([place])   . Hematuria, microscopic 10/09   work up negative (Dr. Amalia Hailey)  . HIV positive ([place])   . 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 Hypothyroidism   . IDDM (insulin dependent diabetes mellitus) ([place])    38 years  . Low HDL (under 40)   . Proteinuria   . Retinopathy    x2  . SIRS (systemic inflammatory response syndrome) ([facility])     Past Surgical History:  Procedure Laterality Date  . CATARACT EXTRACTION Right   . COLONOSCOPY    . ESOPHAGOGASTRODUODENOSCOPY    . EYE SURGERY  2006,2001   x2   . HIP ARTHROPLASTY Right 05/10/2016   Procedure: RIGHT HIP HEMIARTHROPLASTY;  Surgeon: Marchia Bond, MD;  Location: [facility];  Service: Orthopedics;  Laterality: Right;  . insulin pump    . LASIK Bilateral   . VIDEO BRONCHOSCOPY Bilateral  12/15/2012   Procedure: VIDEO BRONCHOSCOPY WITH FLUORO;  Surgeon: Kathee Delton, MD;  [facility];  Service: Cardiopulmonary;  Laterality: Bilateral;  . VITRECTOMY  bilateral    There were no vitals filed for this visit.      Subjective Assessment - 07/09/16 1450    Subjective I was sore after that last treatment.  like to go a bit easiet today.   Patient Stated Goals Get back to normal.   Pain Score 2    Pain Location Hip   Pain Orientation Right   Pain Descriptors / Indicators Aching   Pain Type Acute pain   Pain Onset More than a month ago                         [facility] Adult PT Treatment/Exercise - 07/09/16 0001      Neuro Re-ed    Neuro Re-ed Details  Rebounder on Airex balance pad (2#) ball x 5 minutes f/b Rockerboard and dynadisc with left toe down x 8 minutes.     Exercises   Exercises Knee/Hip     Knee/Hip Exercises: Aerobic   Nustep level 5 x 20 minutes.     Knee/Hip Exercises: Machines for Strengthening   Cybex Knee Extension Reclined:  10# x 5 minutes.  PT Long Term Goals - 06/10/16 1903      PT LONG TERM GOAL #1   Title Independent with a HEP.   Time 6   Period Weeks   Status New     PT LONG TERM GOAL #2   Title Increase left hip strength to a solid 5/5 to increase stability for functional activites.   Time 6   Period Weeks   Status New     PT LONG TERM GOAL #3   Title Perform a reciprocating stair gait.   Time 6   Period Weeks   Status New     PT LONG TERM GOAL #4   Title Walk without deviation.   Time 6   Period Weeks   Status New     PT LONG TERM GOAL #5   Title Perform ADL's with pain not > 3/10.   Time 6   Period Weeks   Status New               Plan - 07/09/16 1518    Clinical Impression Statement Patient is making excellent progress.  His balance has improved significantly.      Patient will benefit from skilled therapeutic intervention in order to improve  the following deficits and impairments:  Abnormal gait, Decreased activity tolerance, Decreased strength, Pain  Visit Diagnosis: Pain in left hip  Muscle weakness (generalized)     Problem List Patient Active Problem List   Diagnosis Date Noted  . Fever 05/14/2016  . S/P ORIF (open reduction internal fixation) fracture   . Closed right hip fracture, initial encounter ([place]) 05/09/2016  . Benign prostatic hyperplasia without lower urinary tract symptoms 04/18/2016  . Vitamin D deficiency 04/18/2016  . OSA (obstructive sleep apnea) 10/06/2014  . Screening examination for venereal disease 12/23/2013  . Pseudophakia of both eyes 06/23/2013  . Pain in joint, lower leg 11/19/2012  . Unspecified sinusitis (chronic) 09/16/2012  . Right groin wound 08/09/2012  . HIV disease ([place]) 06/02/2012  . Convulsions/seizures ([place]) 05/16/2012  . SIRS (systemic inflammatory response syndrome) ([place]) 04/18/2012  . Pulmonary infiltrate 04/18/2012  . Gastroparesis 04/18/2012  . Anemia, iron deficiency   . Type 1 diabetes mellitus ([place]) 05/08/2011  . Acquired hypothyroidism 05/08/2011  . Chronic kidney disease, stage 3, mod decreased GFR 12/31/2010    Llewellyn Choplin, [name] 07/09/2016, 3:24 PM  [facility] [address] [place], [place], [address] Phone: [phone]   Fax:  [phone]  Name: Patrick Brown MRN: [id number] Date of Birth: [birth year]

## 2016-07-10 LAB — BMP8+EGFR
BUN / CREAT RATIO: 13 (ref 9–20)
BUN: 24 mg/dL (ref 6–24)
CALCIUM: 9.7 mg/dL (ref 8.7–10.2)
CO2: 20 mmol/L (ref 18–29)
CREATININE: 1.79 mg/dL — AB (ref 0.76–1.27)
Chloride: 102 mmol/L (ref 96–106)
GFR calc Af Amer: 51 mL/min/{1.73_m2} — ABNORMAL LOW (ref >59)
GFR calc non Af Amer: 44 mL/min/{1.73_m2} — ABNORMAL LOW (ref >59)
Glucose: 196 mg/dL — ABNORMAL HIGH (ref 65–99)
Potassium: 4.3 mmol/L (ref 3.5–5.2)
Sodium: 139 mmol/L (ref 134–144)

## 2016-07-11 ENCOUNTER — Ambulatory Visit: Payer: Worker's Compensation | Admitting: *Deleted

## 2016-07-11 DIAGNOSIS — M6281 Muscle weakness (generalized): Secondary | ICD-10-CM

## 2016-07-11 DIAGNOSIS — E103552 Type 1 diabetes mellitus with stable proliferative diabetic retinopathy, left eye: Secondary | ICD-10-CM | POA: Diagnosis not present

## 2016-07-11 DIAGNOSIS — H26491 Other secondary cataract, right eye: Secondary | ICD-10-CM | POA: Diagnosis not present

## 2016-07-11 DIAGNOSIS — M25552 Pain in left hip: Secondary | ICD-10-CM | POA: Diagnosis not present

## 2016-07-11 DIAGNOSIS — E103551 Type 1 diabetes mellitus with stable proliferative diabetic retinopathy, right eye: Secondary | ICD-10-CM | POA: Diagnosis not present

## 2016-07-11 LAB — HM DIABETES EYE EXAM

## 2016-07-11 NOTE — Therapy (Signed)
[facility] [place], [place], [address] Phone: [phone]   Fax:  [phone]  Physical Therapy Treatment  Patient Details  Name: Patrick Brown MRN: [id number] Date of Birth: 11-19-1970 Referring Provider: Marchia Bond MD  Encounter Date: 07/11/2016      PT End of Session - 07/11/16 1458    Visit Number 11   Number of Visits 24   Date for PT Re-Evaluation 08/19/16   PT Start Time 1430   PT Stop Time 1520   PT Time Calculation (min) 50 min      Past Medical History:  Diagnosis Date  . Anemia, iron deficiency On procrit  . CAP (community acquired pneumonia)   . CKD (chronic kidney disease) stage 3, GFR 30-59 ml/min   . Degenerative arthritis   . Depression   . Dyslipidemia   . Gastroesophageal reflux disease   . Gastroparesis diabeticorum ([place])   . Hematuria, microscopic 10/09   work up negative (Dr. Amalia Hailey)  . HIV positive ([place])   . 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 Hypothyroidism   . IDDM (insulin dependent diabetes mellitus) ([place])    38 years  . Low HDL (under 40)   . Proteinuria   . Retinopathy    x2  . SIRS (systemic inflammatory response syndrome) ([facility])     Past Surgical History:  Procedure Laterality Date  . CATARACT EXTRACTION Right   . COLONOSCOPY    . ESOPHAGOGASTRODUODENOSCOPY    . EYE SURGERY  2006,2001   x2   . HIP ARTHROPLASTY Right 05/10/2016   Procedure: RIGHT HIP HEMIARTHROPLASTY;  Surgeon: Marchia Bond, MD;  Location: [facility];  Service: Orthopedics;  Laterality: Right;  . insulin pump    . LASIK Bilateral   . VIDEO BRONCHOSCOPY Bilateral 12/15/2012   Procedure: VIDEO BRONCHOSCOPY WITH FLUORO;  Surgeon: Kathee Delton, MD;  [facility];  Service:  Cardiopulmonary;  Laterality: Bilateral;  . VITRECTOMY  bilateral    There were no vitals filed for this visit.      Subjective Assessment - 07/11/16 1441    Subjective Doing ok. Hip is doing good   Patient Stated Goals Get back to normal.   Currently in Pain? No/denies                         [facility] Adult PT Treatment/Exercise - 07/11/16 0001      Neuro Re-ed    Neuro Re-ed Details   Airex balance pad (2#) ball x 5 minutes f/b Rockerboard 5 minutes.     Exercises   Exercises Knee/Hip     Knee/Hip Exercises: Aerobic   Nustep level 6 x 20 minutes.     Knee/Hip Exercises: Standing   Walking with Sports Cord XTS pink resisted walking to the Left. 2x10 and retro                     PT Long Term Goals - 07/11/16 1459      PT LONG TERM GOAL #3   Title Perform a reciprocating stair gait.   Time 6   Period Weeks   Status Achieved     PT LONG TERM GOAL #4   Title Walk without deviation.   Time 6   Period Weeks  Status Partially Met     PT LONG TERM GOAL #5   Title Perform ADL's with pain not > 3/10.   Time 6   Period Weeks   Status Achieved               Plan - 07/11/16 1458    Rehab Potential Excellent   PT Frequency 2x / week   PT Duration 6 weeks   PT Treatment/Interventions ADLs/Self Care Home Management;Cryotherapy;Electrical Stimulation;Moist Heat;Stair training;Gait training;Functional mobility training;Therapeutic activities;Therapeutic exercise;Neuromuscular re-education;Manual techniques;Patient/family education   PT Next Visit Plan Continue with RLE strengthening with gait training as needed per MPT POC.      PT Home Exercise Plan HEP- SLR, standing hip abduction, bridging, sit to stands, squats to chair   Consulted and Agree with Plan of Care Patient      Patient will benefit from skilled therapeutic intervention in order to improve the following deficits and impairments:  Abnormal gait, Decreased activity  tolerance, Decreased strength, Pain  Visit Diagnosis: Pain in left hip  Muscle weakness (generalized)     Problem List Patient Active Problem List   Diagnosis Date Noted  . Fever 05/14/2016  . S/P ORIF (open reduction internal fixation) fracture   . Closed right hip fracture, initial encounter ([place]) 05/09/2016  . Benign prostatic hyperplasia without lower urinary tract symptoms 04/18/2016  . Vitamin D deficiency 04/18/2016  . OSA (obstructive sleep apnea) 10/06/2014  . Screening examination for venereal disease 12/23/2013  . Pseudophakia of both eyes 06/23/2013  . Pain in joint, lower leg 11/19/2012  . Unspecified sinusitis (chronic) 09/16/2012  . Right groin wound 08/09/2012  . HIV disease ([place]) 06/02/2012  . Convulsions/seizures ([place]) 05/16/2012  . SIRS (systemic inflammatory response syndrome) ([place]) 04/18/2012  . Pulmonary infiltrate 04/18/2012  . Gastroparesis 04/18/2012  . Anemia, iron deficiency   . Type 1 diabetes mellitus ([place]) 05/08/2011  . Acquired hypothyroidism 05/08/2011  . Chronic kidney disease, stage 3, mod decreased GFR 12/31/2010    Manford Sprong,CHRIS, PTA 07/11/2016, 3:22 PM  [facility] [place], [place], [address] Phone: [phone]   Fax:  [phone]  Name: Patrick Brown MRN: [id number] Date of Birth: [birth year]

## 2016-07-15 DIAGNOSIS — H26491 Other secondary cataract, right eye: Secondary | ICD-10-CM | POA: Diagnosis not present

## 2016-07-16 ENCOUNTER — Ambulatory Visit: Payer: Worker's Compensation | Admitting: Physical Therapy

## 2016-07-16 DIAGNOSIS — M25552 Pain in left hip: Secondary | ICD-10-CM | POA: Diagnosis not present

## 2016-07-16 DIAGNOSIS — R0902 Hypoxemia: Secondary | ICD-10-CM | POA: Diagnosis not present

## 2016-07-16 DIAGNOSIS — J9690 Respiratory failure, unspecified, unspecified whether with hypoxia or hypercapnia: Secondary | ICD-10-CM | POA: Diagnosis not present

## 2016-07-16 DIAGNOSIS — M25551 Pain in right hip: Secondary | ICD-10-CM

## 2016-07-16 DIAGNOSIS — M6281 Muscle weakness (generalized): Secondary | ICD-10-CM

## 2016-07-16 DIAGNOSIS — G4733 Obstructive sleep apnea (adult) (pediatric): Secondary | ICD-10-CM | POA: Diagnosis not present
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
## 2016-07-18 ENCOUNTER — Ambulatory Visit: Payer: Worker's Compensation | Admitting: *Deleted

## 2016-07-18 DIAGNOSIS — M25552 Pain in left hip: Secondary | ICD-10-CM

## 2016-07-18 DIAGNOSIS — M6281 Muscle weakness (generalized): Secondary | ICD-10-CM

## 2016-07-18 DIAGNOSIS — M25551 Pain in right hip: Secondary | ICD-10-CM

## 2016-07-18 NOTE — Therapy (Signed)
[facility] [place], [place], [address] Phone: [phone]   Fax:  [phone]  Physical Therapy Treatment  Patient Details  Name: Patrick Brown MRN: [id number] Date of Birth: 20-Mar-1970 Referring Provider: Marchia Bond MD  Encounter Date: 07/18/2016      PT End of Session - 07/18/16 1446    Visit Number 13   Number of Visits 24   Date for PT Re-Evaluation 08/19/16   PT Start Time 1430   PT Stop Time 1520   PT Time Calculation (min) 50 min      Past Medical History:  Diagnosis Date  . Anemia, iron deficiency On procrit  . CAP (community acquired pneumonia)   . CKD (chronic kidney disease) stage 3, GFR 30-59 ml/min   . Degenerative arthritis   . Depression   . Dyslipidemia   . Gastroesophageal reflux disease   . Gastroparesis diabeticorum ([place])   . Hematuria, microscopic 10/09   work up negative (Dr. Amalia Hailey)  . HIV positive ([place])   . 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 Hypothyroidism   . IDDM (insulin dependent diabetes mellitus) ([place])    38 years  . Low HDL (under 40)   . Proteinuria   . Retinopathy    x2  . SIRS (systemic inflammatory response syndrome) (HCC)     Past Surgical History:  Procedure Laterality Date  . CATARACT EXTRACTION Right   . COLONOSCOPY    . ESOPHAGOGASTRODUODENOSCOPY    . EYE SURGERY  2006,2001   x2   . HIP ARTHROPLASTY Right 05/10/2016   Procedure: RIGHT HIP HEMIARTHROPLASTY;  Surgeon: Marchia Bond, MD;  Location: [facility];  Service: Orthopedics;  Laterality: Right;  . insulin pump    . LASIK Bilateral   . VIDEO BRONCHOSCOPY Bilateral 12/15/2012   Procedure: VIDEO BRONCHOSCOPY WITH FLUORO;  Surgeon: Kathee Delton, MD;  [facility];  Service:  Cardiopulmonary;  Laterality: Bilateral;  . VITRECTOMY  bilateral    There were no vitals filed for this visit.      Subjective Assessment - 07/18/16 1446    Subjective I'm doing better with minimal pain   Patient Stated Goals Get back to normal.   Currently in Pain? No/denies                         [facility] Adult PT Treatment/Exercise - 07/18/16 0001      Neuro Re-ed    Neuro Re-ed Details   Airex balance pad (2#) ball x 5 minutes f/b Rockerboard 5 minutes.     Exercises   Exercises Knee/Hip     Knee/Hip Exercises: Aerobic   Nustep Level 5 x 20 minutes.     Knee/Hip Exercises: Machines for Strengthening   Cybex Knee Extension Reclined:  20# x 3 minutes.   Cybex Knee Flexion 30# x 52mnutes.     Knee/Hip Exercises: Standing   Walking with Sports Cord XTS pink resisted walking to the Left.   x 10                     PT Long Term Goals - 07/18/16 1531      PT LONG TERM GOAL #1   Title Independent with a HEP.   Time 6   Period Weeks  Status Achieved     PT LONG TERM GOAL #2   Title Increase left hip strength to a solid 5/5 to increase stability for functional activites.   Time 6   Period Weeks   Status On-going     PT LONG TERM GOAL #3   Title Perform a reciprocating stair gait.   Time 6   Period Weeks   Status Achieved     PT LONG TERM GOAL #4   Title Walk without deviation.   Time 6   Period Weeks   Status Partially Met     PT LONG TERM GOAL #5   Title Perform ADL's with pain not > 3/10.   Time 6   Period Weeks   Status Achieved               Plan - 07/18/16 1447    Clinical Impression Statement Pt arrived to clinic today doing fairly well with minimal pain and was able to complete all therex and balance act.'s with minimal pain increase. He has met the majority of LTGs , but still with mild strength deficit 4/5 and gait deviation when fatigued   Rehab Potential Excellent   PT Frequency 2x / week   PT Duration 6  weeks   PT Treatment/Interventions ADLs/Self Care Home Management;Cryotherapy;Electrical Stimulation;Moist Heat;Stair training;Gait training;Functional mobility training;Therapeutic activities;Therapeutic exercise;Neuromuscular re-education;Manual techniques;Patient/family education   PT Next Visit Plan Continue with RLE strengthening with gait training as needed per MPT POC.      PT Home Exercise Plan HEP- SLR, standing hip abduction, bridging, sit to stands, squats to chair   Consulted and Agree with Plan of Care Patient      Patient will benefit from skilled therapeutic intervention in order to improve the following deficits and impairments:  Abnormal gait, Decreased activity tolerance, Decreased strength, Pain  Visit Diagnosis: Pain in right hip  Muscle weakness (generalized)  Pain in left hip     Problem List Patient Active Problem List   Diagnosis Date Noted  . Fever 05/14/2016  . S/P ORIF (open reduction internal fixation) fracture   . Closed right hip fracture, initial encounter ([place]) 05/09/2016  . Benign prostatic hyperplasia without lower urinary tract symptoms 04/18/2016  . Vitamin D deficiency 04/18/2016  . OSA (obstructive sleep apnea) 10/06/2014  . Screening examination for venereal disease 12/23/2013  . Pseudophakia of both eyes 06/23/2013  . Pain in joint, lower leg 11/19/2012  . Unspecified sinusitis (chronic) 09/16/2012  . Right groin wound 08/09/2012  . HIV disease ([place]) 06/02/2012  . Convulsions/seizures ([place]) 05/16/2012  . SIRS (systemic inflammatory response syndrome) ([place]) 04/18/2012  . Pulmonary infiltrate 04/18/2012  . Gastroparesis 04/18/2012  . Anemia, iron deficiency   . Type 1 diabetes mellitus ([place]) 05/08/2011  . Acquired hypothyroidism 05/08/2011  . Chronic kidney disease, stage 3, mod decreased GFR 12/31/2010    Fayrene Towner,CHRIS, PTA 07/18/2016, 3:36 PM  [facility] [address] [place],  [place], [address] Phone: [phone]   Fax:  [phone]  Name: Demarrion Meiklejohn MRN: [id number] Date of Birth: 1971-01-22

## 2016-07-21 ENCOUNTER — Other Ambulatory Visit: Payer: Self-pay | Admitting: Family Medicine

## 2016-07-23 ENCOUNTER — Ambulatory Visit: Payer: Worker's Compensation | Admitting: Physical Therapy

## 2016-07-23 ENCOUNTER — Encounter: Payer: Self-pay | Admitting: Physical Therapy

## 2016-07-23 DIAGNOSIS — M6281 Muscle weakness (generalized): Secondary | ICD-10-CM

## 2016-07-23 DIAGNOSIS — M25552 Pain in left hip: Secondary | ICD-10-CM | POA: Diagnosis not present

## 2016-07-23 DIAGNOSIS — M25551 Pain in right hip: Secondary | ICD-10-CM

## 2016-07-23 NOTE — Therapy (Signed)
[facility] [place], [place], [address] Phone: [phone]   Fax:  [phone]  Physical Therapy Treatment  Patient Details  Name: Jurgen Groeneveld MRN: [id number] Date of Birth: 05-Mar-1970 Referring Provider: Marchia Bond MD  Encounter Date: 07/23/2016      PT End of Session - 07/23/16 1434    Visit Number 14   Number of Visits 24   Date for PT Re-Evaluation 08/19/16   PT Start Time 1431   PT Stop Time 1516   PT Time Calculation (min) 45 min   Activity Tolerance Patient tolerated treatment well   Behavior During Therapy [facility] for tasks assessed/performed      Past Medical History:  Diagnosis Date  . Anemia, iron deficiency On procrit  . CAP (community acquired pneumonia)   . CKD (chronic kidney disease) stage 3, GFR 30-59 ml/min   . Degenerative arthritis   . Depression   . Dyslipidemia   . Gastroesophageal reflux disease   . Gastroparesis diabeticorum ([place])   . Hematuria, microscopic 10/09   work up negative (Dr. Amalia Hailey)  . HIV positive ([place])   . 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 Hypothyroidism   . IDDM (insulin dependent diabetes mellitus) ([place])    38 years  . Low HDL (under 40)   . Proteinuria   . Retinopathy    x2  . SIRS (systemic inflammatory response syndrome) (HCC)     Past Surgical History:  Procedure Laterality Date  . CATARACT EXTRACTION Right   . COLONOSCOPY    . ESOPHAGOGASTRODUODENOSCOPY    . EYE SURGERY  2006,2001   x2   . HIP ARTHROPLASTY Right 05/10/2016   Procedure: RIGHT HIP HEMIARTHROPLASTY;  Surgeon: Marchia Bond, MD;  Location: [facility];  Service: Orthopedics;  Laterality: Right;  . insulin pump    . LASIK Bilateral   . VIDEO BRONCHOSCOPY Bilateral  12/15/2012   Procedure: VIDEO BRONCHOSCOPY WITH FLUORO;  Surgeon: Kathee Delton, MD;  [facility];  Service: Cardiopulmonary;  Laterality: Bilateral;  . VITRECTOMY  bilateral    There were no vitals filed for this visit.      Subjective Assessment - 07/23/16 1433    Subjective Reports that his hip is doing good and goes back to Dr. Mardelle Matte next wednesday.   Patient Stated Goals Get back to normal.   Currently in Pain? No/denies            [facility] PT Assessment - 07/23/16 0001      Assessment   Medical Diagnosis Left hip hemiarthroplasty.   Onset Date/Surgical Date 05/10/16   Next MD Visit 07/31/2016     Restrictions   Weight Bearing Restrictions No                     [facility] Adult PT Treatment/Exercise - 07/23/16 0001      Knee/Hip Exercises: Aerobic   Nustep L6 x20 min for endurance     Knee/Hip Exercises: Machines for Strengthening   Cybex Knee Extension 20# 3x10 reps   Cybex Knee Flexion 30# 3x10 reps   Cybex Leg Press 2 pl, seat 8, 3x10 reps             Balance Exercises - 07/23/16 1504      Balance Exercises: Standing   Standing Eyes  Opened Narrow base of support (BOS);Foam/compliant surface  intermittant UE support x3 min on inverted BOSU   Other Standing Exercises Resisted walking with pink XTS x10 reps each directions                PT Long Term Goals - 07/18/16 1531      PT LONG TERM GOAL #1   Title Independent with a HEP.   Time 6   Period Weeks   Status Achieved     PT LONG TERM GOAL #2   Title Increase left hip strength to a solid 5/5 to increase stability for functional activites.   Time 6   Period Weeks   Status On-going     PT LONG TERM GOAL #3   Title Perform a reciprocating stair gait.   Time 6   Period Weeks   Status Achieved     PT LONG TERM GOAL #4   Title Walk without deviation.   Time 6   Period Weeks   Status Partially Met     PT LONG TERM GOAL #5   Title Perform ADL's with pain not >  3/10.   Time 6   Period Weeks   Status Achieved               Plan - 07/23/16 1549    Clinical Impression Statement Patient arrived in clinic with no complaints but excited that he could almost put his R sock on again. Patient had no complaints with any of the machine strengthening and able to complete 20 minutes on NuStep for endurance training. Patient did well on inverted BOSU without any UE activity during today's treatment. Goals still on-going secondary to LE strength deficits and gait deviations.   Rehab Potential Excellent   PT Frequency 2x / week   PT Duration 6 weeks   PT Treatment/Interventions ADLs/Self Care Home Management;Cryotherapy;Electrical Stimulation;Moist Heat;Stair training;Gait training;Functional mobility training;Therapeutic activities;Therapeutic exercise;Neuromuscular re-education;Manual techniques;Patient/family education   PT Next Visit Plan Continue with RLE strengthening with gait training as needed per MPT POC.      PT Home Exercise Plan HEP- SLR, standing hip abduction, bridging, sit to stands, squats to chair   Consulted and Agree with Plan of Care Patient      Patient will benefit from skilled therapeutic intervention in order to improve the following deficits and impairments:  Abnormal gait, Decreased activity tolerance, Decreased strength, Pain  Visit Diagnosis: Pain in right hip  Muscle weakness (generalized)     Problem List Patient Active Problem List   Diagnosis Date Noted  . Fever 05/14/2016  . S/P ORIF (open reduction internal fixation) fracture   . Closed right hip fracture, initial encounter (HCC) 05/09/2016  . Benign prostatic hyperplasia without lower urinary tract symptoms 04/18/2016  . Vitamin D deficiency 04/18/2016  . OSA (obstructive sleep apnea) 10/06/2014  . Screening examination for venereal disease 12/23/2013  . Pseudophakia of both eyes 06/23/2013  . Pain in joint, lower leg 11/19/2012  . Unspecified sinusitis  (chronic) 09/16/2012  . Right groin wound 08/09/2012  . HIV disease (HCC) 06/02/2012  . Convulsions/seizures (HCC) 05/16/2012  . SIRS (systemic inflammatory response syndrome) (HCC) 04/18/2012  . Pulmonary infiltrate 04/18/2012  . Gastroparesis 04/18/2012  . Anemia, iron deficiency   . Type 1 diabetes mellitus (HCC) 05/08/2011  . Acquired hypothyroidism 05/08/2011  . Chronic kidney disease, stage 3, mod decreased GFR 12/31/2010    Kelsey M Parsons, PTA 07/23/2016, 3:52 PM  [facility] [address] [place], [place], [address] Phone: [phone]   Fax:  [phone]  Name: Eligha Mcelvain MRN: [id number] Date of Birth: 11/04/1970   

## 2016-07-25 ENCOUNTER — Encounter: Payer: Self-pay | Admitting: Physical Therapy

## 2016-07-25 ENCOUNTER — Ambulatory Visit: Payer: Worker's Compensation | Admitting: Physical Therapy

## 2016-07-25 DIAGNOSIS — M6281 Muscle weakness (generalized): Secondary | ICD-10-CM

## 2016-07-25 DIAGNOSIS — M25552 Pain in left hip: Secondary | ICD-10-CM | POA: Diagnosis not present

## 2016-07-25 DIAGNOSIS — N183 Chronic kidney disease, stage 3 (moderate): Secondary | ICD-10-CM | POA: Diagnosis not present

## 2016-07-25 DIAGNOSIS — M25551 Pain in right hip: Secondary | ICD-10-CM

## 2016-07-25 NOTE — Therapy (Signed)
[facility] [address] [place], [place], [address] Phone: [phone]   Fax:  [phone]  Physical Therapy Treatment  Patient Details  Name: Patrick Brown MRN: [id number] Date of Birth: 04-12-1970 Referring Provider: Teryl Lucy MD  Encounter Date: 07/25/2016      PT End of Session - 07/25/16 1010    Visit Number 15   Number of Visits 24   Date for PT Re-Evaluation 08/19/16   PT Start Time 0950   PT Stop Time 1030   PT Time Calculation (min) 40 min   Activity Tolerance Patient tolerated treatment well   Behavior During Therapy [facility] for tasks assessed/performed      Past Medical History:  Diagnosis Date  . Anemia, iron deficiency On procrit  . CAP (community acquired pneumonia)   . CKD (chronic kidney disease) stage 3, GFR 30-59 ml/min   . Degenerative arthritis   . Depression   . Dyslipidemia   . Gastroesophageal reflux disease   . Gastroparesis diabeticorum (HCC)   . Hematuria, microscopic 10/09   work up negative (Dr. Logan Bores)  . HIV positive (HCC)   . 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 Hypothyroidism   . IDDM (insulin dependent diabetes mellitus) (HCC)    38 years  . Low HDL (under 40)   . Proteinuria   . Retinopathy    x2  . SIRS (systemic inflammatory response syndrome) (HCC)     Past Surgical History:  Procedure Laterality Date  . CATARACT EXTRACTION Right   . COLONOSCOPY    . ESOPHAGOGASTRODUODENOSCOPY    . EYE SURGERY  2006,2001   x2   . HIP ARTHROPLASTY Right 05/10/2016   Procedure: RIGHT HIP HEMIARTHROPLASTY;  Surgeon: Teryl Lucy, MD;  Location: [facility];  Service: Orthopedics;  Laterality: Right;  . insulin pump    . LASIK Bilateral   . VIDEO BRONCHOSCOPY Bilateral  12/15/2012   Procedure: VIDEO BRONCHOSCOPY WITH FLUORO;  Surgeon: Barbaraann Share, MD;  [facility];  Service: Cardiopulmonary;  Laterality: Bilateral;  . VITRECTOMY  bilateral    There were no vitals filed for this visit.      Subjective Assessment - 07/25/16 0951    Subjective Reports that his R hip is still weak from last treatment. Reports that he almost slipped yesterday at [facility] as he was stepping sideways and caught his foot on a tile.   Patient Stated Goals Get back to normal.   Currently in Pain? Yes   Pain Score --  No pain score provided    Pain Location Hip   Pain Orientation Right   Pain Descriptors / Indicators Sore   Pain Type Acute pain   Pain Onset More than a month ago            [facility] PT Assessment - 07/25/16 0001      Assessment   Medical Diagnosis Left hip hemiarthroplasty.   Onset Date/Surgical Date 05/10/16   Next MD Visit 07/31/2016     Restrictions   Weight Bearing Restrictions No                     [facility] Adult PT Treatment/Exercise - 07/25/16 0001      Knee/Hip Exercises: Aerobic   Nustep L6 x20 min for endurance     Knee/Hip Exercises:  Machines for Strengthening   Cybex Knee Extension 20# 3x10 reps   Cybex Knee Flexion 30# 3x10 reps   Cybex Leg Press 2 pl, seat 8, 3x10 reps     Knee/Hip Exercises: Standing   Hip Abduction AROM;Both;3 sets;10 reps;Knee straight   Walking with Sports Cord Orange XTS resisted walking to B sides x10 reps                     PT Long Term Goals - 07/18/16 1531      PT LONG TERM GOAL #1   Title Independent with a HEP.   Time 6   Period Weeks   Status Achieved     PT LONG TERM GOAL #2   Title Increase left hip strength to a solid 5/5 to increase stability for functional activites.   Time 6   Period Weeks   Status On-going     PT LONG TERM GOAL #3   Title Perform a reciprocating stair gait.   Time 6   Period Weeks   Status Achieved     PT LONG TERM GOAL #4    Title Walk without deviation.   Time 6   Period Weeks   Status Partially Met     PT LONG TERM GOAL #5   Title Perform ADL's with pain not > 3/10.   Time 6   Period Weeks   Status Achieved               Plan - 07/25/16 1054    Clinical Impression Statement Patient arrived to clinic with reports of hip soreness from the initiation of new exercises completed in previous treatment. Patient had good response from stationary bike although antalgic/fatigue deviations noted in ambulation after completion. B hip abduction strengthening completed in standing today to correct hip/pelvic instability. No complaints in regards to increased resistance with resisted walking to B sides.   Rehab Potential Excellent   PT Frequency 2x / week   PT Duration 6 weeks   PT Treatment/Interventions ADLs/Self Care Home Management;Cryotherapy;Electrical Stimulation;Moist Heat;Stair training;Gait training;Functional mobility training;Therapeutic activities;Therapeutic exercise;Neuromuscular re-education;Manual techniques;Patient/family education   PT Next Visit Plan Continue with RLE strengthening with gait training as needed per MPT POC.      PT Home Exercise Plan HEP- SLR, standing hip abduction, bridging, sit to stands, squats to chair   Consulted and Agree with Plan of Care Patient      Patient will benefit from skilled therapeutic intervention in order to improve the following deficits and impairments:  Abnormal gait, Decreased activity tolerance, Decreased strength, Pain  Visit Diagnosis: Pain in right hip  Muscle weakness (generalized)     Problem List Patient Active Problem List   Diagnosis Date Noted  . Fever 05/14/2016  . S/P ORIF (open reduction internal fixation) fracture   . Closed right hip fracture, initial encounter ([employer]) 05/09/2016  . Benign prostatic hyperplasia without lower urinary tract symptoms 04/18/2016  . Vitamin D deficiency 04/18/2016  . OSA (obstructive sleep apnea)  10/06/2014  . Screening examination for venereal disease 12/23/2013  . Pseudophakia of both eyes 06/23/2013  . Pain in joint, lower leg 11/19/2012  . Unspecified sinusitis (chronic) 09/16/2012  . Right groin wound 08/09/2012  . HIV disease ([place]) 06/02/2012  . Convulsions/seizures ([place]) 05/16/2012  . SIRS (systemic inflammatory response syndrome) ([place]) 04/18/2012  . Pulmonary infiltrate 04/18/2012  . Gastroparesis 04/18/2012  . Anemia, iron deficiency   . Type 1 diabetes mellitus ([place]) 05/08/2011  . Acquired hypothyroidism 05/08/2011  .  Chronic kidney disease, stage 3, mod decreased GFR 12/31/2010    Wynelle Fanny, PTA 07/25/2016, 10:58 AM  [facility] [place], [place], [address] Phone: [phone]   Fax:  [phone]  Name: Patrick Brown MRN: [id number] Date of Birth: December 05, 1970

## 2016-07-30 ENCOUNTER — Ambulatory Visit: Payer: Worker's Compensation | Admitting: Physical Therapy

## 2016-07-30 ENCOUNTER — Other Ambulatory Visit: Payer: Self-pay | Admitting: Family Medicine

## 2016-07-30 ENCOUNTER — Encounter: Payer: Self-pay | Admitting: Physical Therapy

## 2016-07-30 DIAGNOSIS — M25551 Pain in right hip: Secondary | ICD-10-CM

## 2016-07-30 DIAGNOSIS — M6281 Muscle weakness (generalized): Secondary | ICD-10-CM

## 2016-07-30 DIAGNOSIS — M25552 Pain in left hip: Secondary | ICD-10-CM | POA: Diagnosis not present

## 2016-07-30 NOTE — Therapy (Signed)
[facility] [address] [place], [place], [address] Phone: [phone]   Fax:  [phone]  Physical Therapy Treatment  Patient Details  Name: Patrick Brown MRN: [id number] Date of Birth: 29-Aug-1970 Referring Provider: Teryl Lucy MD  Encounter Date: 07/30/2016      PT End of Session - 07/30/16 1439    Visit Number 16   Number of Visits 24   Date for PT Re-Evaluation 08/19/16   PT Start Time 1432   PT Stop Time 1514   PT Time Calculation (min) 42 min   Activity Tolerance Patient tolerated treatment well   Behavior During Therapy [facility] for tasks assessed/performed      Past Medical History:  Diagnosis Date  . Anemia, iron deficiency On procrit  . CAP (community acquired pneumonia)   . CKD (chronic kidney disease) stage 3, GFR 30-59 ml/min   . Degenerative arthritis   . Depression   . Dyslipidemia   . Gastroesophageal reflux disease   . Gastroparesis diabeticorum (HCC)   . Hematuria, microscopic 10/09   work up negative (Dr. Logan Bores)  . HIV positive (HCC)   . 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 Hypothyroidism   . IDDM (insulin dependent diabetes mellitus) (HCC)    38 years  . Low HDL (under 40)   . Proteinuria   . Retinopathy    x2  . SIRS (systemic inflammatory response syndrome) (HCC)     Past Surgical History:  Procedure Laterality Date  . CATARACT EXTRACTION Right   . COLONOSCOPY    . ESOPHAGOGASTRODUODENOSCOPY    . EYE SURGERY  2006,2001   x2   . HIP ARTHROPLASTY Right 05/10/2016   Procedure: RIGHT HIP HEMIARTHROPLASTY;  Surgeon: Teryl Lucy, MD;  Location: [facility] OR;  Service: Orthopedics;  Laterality: Right;  . insulin pump    . LASIK Bilateral   . VIDEO BRONCHOSCOPY Bilateral  12/15/2012   Procedure: VIDEO BRONCHOSCOPY WITH FLUORO;  Surgeon: Barbaraann Share, MD;  Location: [facility];  Service: Cardiopulmonary;  Laterality: Bilateral;  . VITRECTOMY  bilateral    There were no vitals filed for this visit.      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 Patient unable to recall why his hip feels so tired today.   Patient Stated Goals Get back to normal.   Currently in Pain? Yes   Pain Score 6    Pain Location Hip   Pain Orientation Right   Pain Descriptors / Indicators Tiring   Pain Type Acute pain   Pain Onset More than a month ago            [facility] PT Assessment - 07/30/16 0001      Assessment   Medical Diagnosis Left hip hemiarthroplasty.   Onset Date/Surgical Date 05/10/16   Next MD Visit 07/31/2016     Restrictions   Weight Bearing Restrictions No     ROM / Strength   AROM / PROM / Strength Strength     Strength   Overall Strength Deficits;Within functional limits for tasks performed   Strength Assessment Site Hip;Knee   Right/Left Hip Right   Right Hip Flexion 5/5   Right Hip  Extension 4/5   Right Hip ABduction 4+/5   Right/Left Knee Right   Right Knee Flexion 4+/5   Right Knee Extension 5/5                     [facility] Adult PT Treatment/Exercise - 07/30/16 0001      Knee/Hip Exercises: Aerobic   Stationary Bike L3 x20 min     Knee/Hip Exercises: Machines for Strengthening   Cybex Knee Extension 20# xfatigue   Cybex Knee Flexion 30# xfatigue   Cybex Leg Press 3 pl, seat 8 x30 reps     Knee/Hip Exercises: Sidelying   Hip ABduction Strengthening;Right;2 sets;10 reps                     PT Long Term Goals - 07/18/16 1531      PT LONG TERM GOAL #1   Title Independent with a HEP.   Time 6   Period Weeks   Status Achieved     PT LONG TERM GOAL #2   Title  Increase left hip strength to a solid 5/5 to increase stability for functional activites.   Time 6   Period Weeks   Status On-going     PT LONG TERM GOAL #3   Title Perform a reciprocating stair gait.   Time 6   Period Weeks   Status Achieved     PT LONG TERM GOAL #4   Title Walk without deviation.   Time 6   Period Weeks   Status Partially Met     PT LONG TERM GOAL #5   Title Perform ADL's with pain not > 3/10.   Time 6   Period Weeks   Status Achieved               Plan - 07/30/16 1517    Clinical Impression Statement Patient continues to tolerate treatment well although arriving with increased R hip fatigue for unknown reason. Gait deviation continued today especially since fatigue present in R hip with decreased R step length, decreased stride length, decreased weightshift to the R, decreased R hip flexion, antalgic pattern. Exercises completed today to fatigue to focus on strengthening. Weakness still noted with R hip extensors, abductors as well as R knee flexors.   Rehab Potential Excellent   PT Frequency 2x / week   PT Duration 6 weeks   PT Treatment/Interventions ADLs/Self Care Home Management;Cryotherapy;Electrical Stimulation;Moist Heat;Stair training;Gait training;Functional mobility training;Therapeutic activities;Therapeutic exercise;Neuromuscular re-education;Manual techniques;Patient/family education   PT Next Visit Plan Continue with RLE strengthening with gait training as needed per MPT POC.      PT Home Exercise Plan HEP- SLR, standing hip abduction, bridging, sit to stands, squats to chair   Consulted and Agree with Plan of Care Patient      Patient will benefit from skilled therapeutic intervention in order to improve the following deficits and impairments:  Abnormal gait, Decreased activity tolerance, Decreased strength, Pain  Visit Diagnosis: Pain in right hip  Muscle weakness (generalized)  Pain in left hip     Problem List Patient  Active Problem List   Diagnosis Date Noted  . Fever 05/14/2016  . S/P ORIF (open reduction internal fixation) fracture   . Closed right hip fracture, initial encounter (HCC) 05/09/2016  . Benign prostatic hyperplasia without lower urinary tract symptoms 04/18/2016  . Vitamin D deficiency 04/18/2016  . OSA (obstructive sleep apnea) 10/06/2014  . Screening examination for venereal disease 12/23/2013  . Pseudophakia of both  eyes 06/23/2013  . Pain in joint, lower leg 11/19/2012  . Unspecified sinusitis (chronic) 09/16/2012  . Right groin wound 08/09/2012  . HIV disease ([place]) 06/02/2012  . Convulsions/seizures ([place]) 05/16/2012  . SIRS (systemic inflammatory response syndrome) ([place]) 04/18/2012  . Pulmonary infiltrate 04/18/2012  . Gastroparesis 04/18/2012  . Anemia, iron deficiency   . Type 1 diabetes mellitus ([place]) 05/08/2011  . Acquired hypothyroidism 05/08/2011  . Chronic kidney disease, stage 3, mod decreased GFR 12/31/2010    Ahmed Prima, PTA 07/30/16 4:01 PM Mali Applegate MPT [facility] [place], [place], [address] Phone: [phone]   Fax:  [phone]  Name: Patrick Brown MRN: [id number] Date of Birth: 06/15/1970

## 2016-08-01 ENCOUNTER — Other Ambulatory Visit: Payer: Self-pay | Admitting: Family Medicine

## 2016-08-01 ENCOUNTER — Ambulatory Visit: Payer: Worker's Compensation | Admitting: *Deleted

## 2016-08-01 DIAGNOSIS — M25551 Pain in right hip: Secondary | ICD-10-CM

## 2016-08-01 DIAGNOSIS — M6281 Muscle weakness (generalized): Secondary | ICD-10-CM

## 2016-08-01 DIAGNOSIS — M25552 Pain in left hip: Secondary | ICD-10-CM

## 2016-08-01 NOTE — Therapy (Signed)
[facility] [place], [place], [address] Phone: [phone]   Fax:  [phone]  Physical Therapy Treatment  Patient Details  Name: Patrick Brown MRN: [id number] Date of Birth: March 23, 1970 Referring Provider: Marchia Bond MD  Encounter Date: 08/01/2016      PT End of Session - 08/01/16 1456    Visit Number 17   Number of Visits 24   Date for PT Re-Evaluation 08/19/16   PT Start Time 1430   PT Stop Time 1521   PT Time Calculation (min) 51 min      Past Medical History:  Diagnosis Date  . Anemia, iron deficiency On procrit  . CAP (community acquired pneumonia)   . CKD (chronic kidney disease) stage 3, GFR 30-59 ml/min   . Degenerative arthritis   . Depression   . Dyslipidemia   . Gastroesophageal reflux disease   . Gastroparesis diabeticorum ([place])   . Hematuria, microscopic 10/09   work up negative (Dr. Amalia Hailey)  . HIV positive ([place])   . 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 Hypothyroidism   . IDDM (insulin dependent diabetes mellitus) ([place])    38 years  . Low HDL (under 40)   . Proteinuria   . Retinopathy    x2  . SIRS (systemic inflammatory response syndrome) (HCC)     Past Surgical History:  Procedure Laterality Date  . CATARACT EXTRACTION Right   . COLONOSCOPY    . ESOPHAGOGASTRODUODENOSCOPY    . EYE SURGERY  2006,2001   x2   . HIP ARTHROPLASTY Right 05/10/2016   Procedure: RIGHT HIP HEMIARTHROPLASTY;  Surgeon: Marchia Bond, MD;  Location: [place];  Service: Orthopedics;  Laterality: Right;  . insulin pump    . LASIK Bilateral   . VIDEO BRONCHOSCOPY Bilateral 12/15/2012   Procedure: VIDEO BRONCHOSCOPY WITH FLUORO;  Surgeon: Kathee Delton, MD;  Location: [facility];  Service:  Cardiopulmonary;  Laterality: Bilateral;  . VITRECTOMY  bilateral    There were no vitals filed for this visit.      Subjective Assessment - 08/01/16 1450    Subjective RT hip feels good today   Patient Stated Goals Get back to normal.   Currently in Pain? No/denies   Pain Location Hip   Pain Orientation Right   Pain Descriptors / Indicators Tiring   Pain Type Surgical pain   Pain Onset More than a month ago                         [facility] Adult PT Treatment/Exercise - 08/01/16 0001      Knee/Hip Exercises: Aerobic   Stationary Bike L3 x20 min, Elliptical L5,,R5 x 5 mins     Knee/Hip Exercises: Machines for Strengthening   Cybex Knee Extension 20# xfatigue   Cybex Knee Flexion 40# xfatigue   Cybex Leg Press 3 pl, seat 8 x30 reps     Knee/Hip Exercises: Standing   Lateral Step Up 3 sets;Right;10 reps;Step Height: 6"   Forward Step Up Right;3 sets;10 reps;Step Height: 6"                     PT Long Term Goals - 07/18/16 1531      PT LONG TERM GOAL #1   Title Independent with a HEP.  Time 6   Period Weeks   Status Achieved     PT LONG TERM GOAL #2   Title Increase left hip strength to a solid 5/5 to increase stability for functional activites.   Time 6   Period Weeks   Status On-going     PT LONG TERM GOAL #3   Title Perform a reciprocating stair gait.   Time 6   Period Weeks   Status Achieved     PT LONG TERM GOAL #4   Title Walk without deviation.   Time 6   Period Weeks   Status Partially Met     PT LONG TERM GOAL #5   Title Perform ADL's with pain not > 3/10.   Time 6   Period Weeks   Status Achieved               Plan - 08/01/16 1500    Clinical Impression Statement Pt arrived today doing fairly well with no complaints of pain in RT hip. He was able to perform all therex for RT hip strengthening with mainly fatigue. His gait was improved today with less antalgic pattern.   Rehab Potential Excellent   PT  Frequency 2x / week   PT Duration 6 weeks   PT Treatment/Interventions ADLs/Self Care Home Management;Cryotherapy;Electrical Stimulation;Moist Heat;Stair training;Gait training;Functional mobility training;Therapeutic activities;Therapeutic exercise;Neuromuscular re-education;Manual techniques;Patient/family education   PT Next Visit Plan Continue with RLE strengthening with gait training as needed per MPT POC.      PT Home Exercise Plan HEP- SLR, standing hip abduction, bridging, sit to stands, squats to chair   Consulted and Agree with Plan of Care Patient      Patient will benefit from skilled therapeutic intervention in order to improve the following deficits and impairments:  Abnormal gait, Decreased activity tolerance, Decreased strength, Pain  Visit Diagnosis: Pain in right hip  Muscle weakness (generalized)  Pain in left hip     Problem List Patient Active Problem List   Diagnosis Date Noted  . Fever 05/14/2016  . S/P ORIF (open reduction internal fixation) fracture   . Closed right hip fracture, initial encounter ([place]) 05/09/2016  . Benign prostatic hyperplasia without lower urinary tract symptoms 04/18/2016  . Vitamin D deficiency 04/18/2016  . OSA (obstructive sleep apnea) 10/06/2014  . Screening examination for venereal disease 12/23/2013  . Pseudophakia of both eyes 06/23/2013  . Pain in joint, lower leg 11/19/2012  . Unspecified sinusitis (chronic) 09/16/2012  . Right groin wound 08/09/2012  . HIV disease ([place]) 06/02/2012  . Convulsions/seizures ([place]) 05/16/2012  . SIRS (systemic inflammatory response syndrome) ([place]) 04/18/2012  . Pulmonary infiltrate 04/18/2012  . Gastroparesis 04/18/2012  . Anemia, iron deficiency   . Type 1 diabetes mellitus ([place]) 05/08/2011  . Acquired hypothyroidism 05/08/2011  . Chronic kidney disease, stage 3, mod decreased GFR 12/31/2010    RAMSEUR,CHRIS, PTA 08/01/2016, 5:54 PM  [facility] [address] [place], [place], [address] Phone: [phone]   Fax:  [phone]  Name: Patrick Brown MRN: [id number] Date of Birth: [birth year]

## 2016-08-05 ENCOUNTER — Other Ambulatory Visit: Payer: Self-pay | Admitting: Family Medicine

## 2016-08-06 ENCOUNTER — Encounter: Payer: Self-pay | Admitting: *Deleted

## 2016-08-08 ENCOUNTER — Ambulatory Visit: Payer: Worker's Compensation | Attending: Family Medicine | Admitting: Physical Therapy

## 2016-08-08 ENCOUNTER — Encounter: Payer: Self-pay | Admitting: Physical Therapy

## 2016-08-08 DIAGNOSIS — M25551 Pain in right hip: Secondary | ICD-10-CM | POA: Diagnosis present

## 2016-08-08 DIAGNOSIS — M6281 Muscle weakness (generalized): Secondary | ICD-10-CM | POA: Diagnosis present

## 2016-08-08 DIAGNOSIS — M25552 Pain in left hip: Secondary | ICD-10-CM | POA: Diagnosis present

## 2016-08-08 NOTE — Therapy (Signed)
Patrick Brown, [place], [address] Phone: [phone]   Fax:  [phone]  Physical Therapy Treatment  Patient Details  Name: Patrick Brown MRN: [id number] Date of Birth: 12/29/1970 Referring Provider: Marchia Bond MD  Encounter Date: 08/08/2016      PT End of Session - 08/08/16 1455    Visit Number 18   Number of Visits 24   Date for PT Re-Evaluation 08/19/16   PT Start Time 1430   PT Stop Time 1514   PT Time Calculation (min) 44 min   Activity Tolerance Patient tolerated treatment well   Behavior During Therapy [facility] for tasks assessed/performed      Past Medical History:  Diagnosis Date  . Anemia, iron deficiency On procrit  . CAP (community acquired pneumonia)   . CKD (chronic kidney disease) stage 3, GFR 30-59 ml/min   . Degenerative arthritis   . Depression   . Dyslipidemia   . Gastroesophageal reflux disease   . Gastroparesis diabeticorum ([place])   . Hematuria, microscopic 10/09   work up negative (Dr. Amalia Hailey)  . HIV positive ([place])   . 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 Hypothyroidism   . IDDM (insulin dependent diabetes mellitus) ([place])    38 years  . Low HDL (under 40)   . Proteinuria   . Retinopathy    x2  . SIRS (systemic inflammatory response syndrome) (HCC)     Past Surgical History:  Procedure Laterality Date  . CATARACT EXTRACTION Right   . COLONOSCOPY    . ESOPHAGOGASTRODUODENOSCOPY    . EYE SURGERY  2006,2001   x2   . HIP ARTHROPLASTY Right 05/10/2016   Procedure: RIGHT HIP HEMIARTHROPLASTY;  Surgeon: Patrick Bond, MD;  Location: [place];  Service: Orthopedics;  Laterality: Right;  . insulin pump    . LASIK Bilateral   . VIDEO BRONCHOSCOPY Bilateral  12/15/2012   Procedure: VIDEO BRONCHOSCOPY WITH FLUORO;  Surgeon: Patrick Delton, MD;  [facility]: [facility];  Service: Cardiopulmonary;  Laterality: Bilateral;  . VITRECTOMY  bilateral    There were no vitals filed for this visit.      Subjective Assessment - 08/08/16 1437    Subjective Patient has no new complaints and doing overall   Patient Stated Goals Get back to normal.   Currently in Pain? No/denies                         [facility] Adult PT Treatment/Exercise - 08/08/16 0001      Knee/Hip Exercises: Aerobic   Elliptical 75mn L5/R5   Nustep L5 x20 min for endurance/strength     Knee/Hip Exercises: Machines for Strengthening   Cybex Knee Extension 20# 3x15   Cybex Knee Flexion 40# 3x15   Cybex Leg Press 3 pl, seat 8 x30 reps     Knee/Hip Exercises: Standing   Lateral Step Up 3 sets;Right;10 reps;Step Height: 6"   Forward Step Up Right;3 sets;10 reps;Step Height: 6"                     PT Long Term Goals - 07/18/16 1531      PT LONG TERM GOAL #1   Title Independent with a HEP.   Time 6   Period Weeks   Status  Achieved     PT LONG TERM GOAL #2   Title Increase left hip strength to a solid 5/5 to increase stability for functional activites.   Time 6   Period Weeks   Status On-going     PT LONG TERM GOAL #3   Title Perform a reciprocating stair gait.   Time 6   Period Weeks   Status Achieved     PT LONG TERM GOAL #4   Title Walk without deviation.   Time 6   Period Weeks   Status Partially Met     PT LONG TERM GOAL #5   Title Perform ADL's with pain not > 3/10.   Time 6   Period Weeks   Status Achieved               Plan - 08/08/16 1459    Clinical Impression Statement Patient tolerated treatment well today. Patient has imprved activity tolerance and able to complete all exercises with no reported pain. Patient reported being able to perform ADL's with greater ease, with mild difficulty with putting on socks due  to ROM limitations. Patient progressing toward strength goal.    Rehab Potential Excellent   PT Frequency 2x / week   PT Duration 6 weeks   PT Treatment/Interventions ADLs/Self Care Home Management;Cryotherapy;Electrical Stimulation;Moist Heat;Stair training;Gait training;Functional mobility training;Therapeutic activities;Therapeutic exercise;Neuromuscular re-education;Manual techniques;Patient/family education   PT Next Visit Plan Continue with RLE strengthening with gait training as needed per MPT POC.      Consulted and Agree with Plan of Care Patient      Patient will benefit from skilled therapeutic intervention in order to improve the following deficits and impairments:  Abnormal gait, Decreased activity tolerance, Decreased strength, Pain  Visit Diagnosis: Pain in right hip  Muscle weakness (generalized)     Problem List Patient Active Problem List   Diagnosis Date Noted  . Fever 05/14/2016  . S/P ORIF (open reduction internal fixation) fracture   . Closed right hip fracture, initial encounter ([place]) 05/09/2016  . Benign prostatic hyperplasia without lower urinary tract symptoms 04/18/2016  . Vitamin D deficiency 04/18/2016  . OSA (obstructive sleep apnea) 10/06/2014  . Screening examination for venereal disease 12/23/2013  . Pseudophakia of both eyes 06/23/2013  . Pain in joint, lower leg 11/19/2012  . Unspecified sinusitis (chronic) 09/16/2012  . Right groin wound 08/09/2012  . HIV disease ([place]) 06/02/2012  . Convulsions/seizures ([place]) 05/16/2012  . SIRS (systemic inflammatory response syndrome) ([place]) 04/18/2012  . Pulmonary infiltrate 04/18/2012  . Gastroparesis 04/18/2012  . Anemia, iron deficiency   . Type 1 diabetes mellitus ([place]) 05/08/2011  . Acquired hypothyroidism 05/08/2011  . Chronic kidney disease, stage 3, mod decreased GFR 12/31/2010    Patrick Brown P, PTA 08/08/2016, 3:14 PM  [facility] [address] [place], [place], [address] Phone: [phone]   Fax:  [phone]  Name: Patrick Brown MRN: [id number] Date of Birth: August 13, 1970

## 2016-08-12 ENCOUNTER — Encounter: Payer: Self-pay | Admitting: Pediatrics

## 2016-08-12 ENCOUNTER — Ambulatory Visit (INDEPENDENT_AMBULATORY_CARE_PROVIDER_SITE_OTHER): Payer: Medicare Other | Admitting: Pediatrics

## 2016-08-12 VITALS — BP 137/89 | HR 98 | Temp 97.1°F | Ht 70.0 in | Wt 209.4 lb

## 2016-08-12 DIAGNOSIS — R21 Rash and other nonspecific skin eruption: Secondary | ICD-10-CM

## 2016-08-12 DIAGNOSIS — E109 Type 1 diabetes mellitus without complications: Secondary | ICD-10-CM | POA: Diagnosis not present

## 2016-08-12 MED ORDER — TRIAMCINOLONE ACETONIDE 0.025 % EX CREA: 1.0000 "application " | TOPICAL_CREAM | Freq: Two times a day (BID) | CUTANEOUS | 0 refills | Status: DC

## 2016-08-12 NOTE — Progress Notes (Signed)
  Subjective:   Patient ID: Patrick Brown, male    DOB: 09/27/1970, 46 y.o.   MRN: [id number] CC: Rash  HPI: Patrick Brown is a 46 y.o. male presenting for Rash  Here today with his mother  Started about 6 days ago Has been itchy tecnu for poison ivy, pt says it helps some lubriderm lotion helps for a few minutes, thin gets prickly 6 days ago was in tanning bed, only on his back, for five minutes Has atarax at home, makes him very sleepy  Has felt "best he's felt in years" since being in PT Exercising regularly No new meds, no new detergents  Relevant past medical, surgical, family and social history reviewed. Allergies and medications reviewed and updated. History  Smoking Status  . Never Smoker  Smokeless Tobacco  . Never Used   ROS: Per HPI   Objective:    BP 137/89   Pulse 98   Temp (!) 97.1 F (36.2 C) (Oral)   Ht 5\' 10"  (1.778 m)   Wt 209 lb 6.4 oz (95 kg)   BMI 30.05 kg/m   Wt Readings from Last 3 Encounters:  08/12/16 209 lb 6.4 oz (95 kg)  05/10/16 201 lb 6.4 oz (91.4 kg)  04/18/16 206 lb (93.4 kg)    Gen: NAD, alert, cooperative with exam, NCAT EYES: EOMI, no conjunctival injection, or no icterus ENT:   OP without erythema LYMPH: no cervical LAD CV: NRRR, normal S1/S2, no murmur Resp: CTABL, no wheezes, normal WOB Ext: No edema, warm Neuro: Alert and oriented Skin: back with 2-51mm red flat topped blanching papules scattered throughout  Assessment & Plan:  Patrick Brown was seen today for rash.  Diagnoses and all orders for this visit:  Rash and nonspecific skin eruption Complicated PMH, on many medicines, none new Denies exposures to new products Improved today No systemic side effects, feeling well Use below for itching, will need to see dermatology if not improving within next 1-2 days -     triamcinolone (KENALOG) 0.025 % cream; Apply 1 application topically 2 (two) times daily.   Follow up plan: Return if symptoms worsen or fail to  improve. Assunta Found, MD [place]

## 2016-08-12 NOTE — Patient Instructions (Signed)
If not improving we need to get you in to see dermatology

## 2016-08-13 ENCOUNTER — Other Ambulatory Visit: Payer: Self-pay | Admitting: *Deleted

## 2016-08-13 MED ORDER — LORAZEPAM 0.5 MG PO TABS: ORAL_TABLET | ORAL | 2 refills | Status: DC

## 2016-08-13 NOTE — Telephone Encounter (Signed)
Please call in lorazepam with 1 refills 

## 2016-08-13 NOTE — Telephone Encounter (Signed)
Rx called pharmacy

## 2016-08-14 ENCOUNTER — Encounter: Payer: Self-pay | Admitting: Physical Therapy

## 2016-08-14 ENCOUNTER — Ambulatory Visit: Payer: Worker's Compensation | Admitting: Physical Therapy

## 2016-08-14 DIAGNOSIS — M25551 Pain in right hip: Secondary | ICD-10-CM

## 2016-08-14 DIAGNOSIS — L738 Other specified follicular disorders: Secondary | ICD-10-CM | POA: Diagnosis not present

## 2016-08-14 DIAGNOSIS — M25552 Pain in left hip: Secondary | ICD-10-CM

## 2016-08-14 DIAGNOSIS — M6281 Muscle weakness (generalized): Secondary | ICD-10-CM

## 2016-08-14 NOTE — Therapy (Signed)
[facility] [place], [place], [address] Phone: [phone]   Fax:  [phone]  Physical Therapy Treatment  Patient Details  Name: Patrick Brown MRN: [id number] Date of Birth: [birth year] Referring Provider: Marchia Bond MD  Encounter Date: 08/14/2016      PT End of Session - 08/14/16 1518    Visit Number 19   Number of Visits 24   Date for PT Re-Evaluation 08/19/16   PT Start Time 1448   PT Stop Time 1528   PT Time Calculation (min) 43 min   Activity Tolerance Patient tolerated treatment well   Behavior During Therapy [facility] for tasks assessed/performed      Past Medical History:  Diagnosis Date  . Anemia, iron deficiency On procrit  . CAP (community acquired pneumonia)   . CKD (chronic kidney disease) stage 3, GFR 30-59 ml/min   . Degenerative arthritis   . Depression   . Dyslipidemia   . Gastroesophageal reflux disease   . Gastroparesis diabeticorum ([place])   . Hematuria, microscopic 10/09   work up negative (Dr. Amalia Hailey)  . HIV positive ([place])   . 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 Hypothyroidism   . IDDM (insulin dependent diabetes mellitus) ([place])    38 years  . Low HDL (under 40)   . Proteinuria   . Retinopathy    x2  . SIRS (systemic inflammatory response syndrome) (HCC)     Past Surgical History:  Procedure Laterality Date  . CATARACT EXTRACTION Right   . COLONOSCOPY    . ESOPHAGOGASTRODUODENOSCOPY    . EYE SURGERY  2006,2001   x2   . HIP ARTHROPLASTY Right 05/10/2016   Procedure: RIGHT HIP HEMIARTHROPLASTY;  Surgeon: Marchia Bond, MD;  Location: [place];  Service: Orthopedics;  Laterality: Right;  . insulin pump    . LASIK Bilateral   . VIDEO BRONCHOSCOPY Bilateral  12/15/2012   Procedure: VIDEO BRONCHOSCOPY WITH FLUORO;  Surgeon: Kathee Delton, MD;  [facility];  Service: Cardiopulmonary;  Laterality: Bilateral;  . VITRECTOMY  bilateral    There were no vitals filed for this visit.      Subjective Assessment - 08/14/16 1450    Subjective Patient has no new complaints and doing overall   Patient Stated Goals Get back to normal.   Currently in Pain? No/denies                         [facility] Adult PT Treatment/Exercise - 08/14/16 0001      Knee/Hip Exercises: Aerobic   Stationary Bike L3 x31mn   Elliptical 551m L5/R5     Knee/Hip Exercises: Machines for Strengthening   Cybex Knee Extension 20# 3x15   Cybex Knee Flexion 40# 3x15   Cybex Leg Press 3 pl, (seat 8) x45 reps     Knee/Hip Exercises: Standing   Lateral Step Up 3 sets;Right;10 reps;Step Height: 6"   Forward Step Up Right;3 sets;10 reps;Step Height: 6"                     PT Long Term Goals - 07/18/16 1531      PT LONG TERM GOAL #1   Title Independent with a HEP.   Time 6   Period Weeks   Status Achieved  PT LONG TERM GOAL #2   Title Increase left hip strength to a solid 5/5 to increase stability for functional activites.   Time 6   Period Weeks   Status On-going     PT LONG TERM GOAL #3   Title Perform a reciprocating stair gait.   Time 6   Period Weeks   Status Achieved     PT LONG TERM GOAL #4   Title Walk without deviation.   Time 6   Period Weeks   Status Partially Met     PT LONG TERM GOAL #5   Title Perform ADL's with pain not > 3/10.   Time 6   Period Weeks   Status Achieved               Plan - 08/14/16 1526    Clinical Impression Statement Patient tolerated treatment well today. Patient able to complete all exercises with no pain. Patient has some difficulty with endurance tolerance overall yet doing well and feels improvement. Patient progressing toward strength goal.    Rehab Potential  Excellent   PT Frequency 2x / week   PT Duration 6 weeks   PT Treatment/Interventions ADLs/Self Care Home Management;Cryotherapy;Electrical Stimulation;Moist Heat;Stair training;Gait training;Functional mobility training;Therapeutic activities;Therapeutic exercise;Neuromuscular re-education;Manual techniques;Patient/family education   PT Next Visit Plan Continue with RLE strengthening with gait training as needed per MPT POC.      Consulted and Agree with Plan of Care Patient      Patient will benefit from skilled therapeutic intervention in order to improve the following deficits and impairments:  Abnormal gait, Decreased activity tolerance, Decreased strength, Pain  Visit Diagnosis: Pain in right hip  Muscle weakness (generalized)  Pain in left hip     Problem List Patient Active Problem List   Diagnosis Date Noted  . Fever 05/14/2016  . S/P ORIF (open reduction internal fixation) fracture   . Closed right hip fracture, initial encounter ([place]) 05/09/2016  . Benign prostatic hyperplasia without lower urinary tract symptoms 04/18/2016  . Vitamin D deficiency 04/18/2016  . OSA (obstructive sleep apnea) 10/06/2014  . Screening examination for venereal disease 12/23/2013  . Pseudophakia of both eyes 06/23/2013  . Pain in joint, lower leg 11/19/2012  . Unspecified sinusitis (chronic) 09/16/2012  . Right groin wound 08/09/2012  . HIV disease ([place]) 06/02/2012  . Convulsions/seizures ([place]) 05/16/2012  . SIRS (systemic inflammatory response syndrome) ([place]) 04/18/2012  . Pulmonary infiltrate 04/18/2012  . Gastroparesis 04/18/2012  . Anemia, iron deficiency   . Type 1 diabetes mellitus ([place]) 05/08/2011  . Acquired hypothyroidism 05/08/2011  . Chronic kidney disease, stage 3, mod decreased GFR 12/31/2010    Patrick Brown P, PTA 08/14/2016, 3:29 PM  [facility] [place], [place], [address] Phone: [phone]   Fax:   [phone]  Name: Patrick Brown MRN: [id number] Date of Birth: 12/14/1970

## 2016-08-15 ENCOUNTER — Ambulatory Visit: Payer: Medicare Other | Admitting: Physical Therapy

## 2016-08-15 ENCOUNTER — Encounter: Payer: Self-pay | Admitting: Physical Therapy

## 2016-08-15 ENCOUNTER — Ambulatory Visit: Payer: Worker's Compensation | Admitting: Physical Therapy

## 2016-08-15 DIAGNOSIS — M25551 Pain in right hip: Secondary | ICD-10-CM

## 2016-08-15 DIAGNOSIS — M6281 Muscle weakness (generalized): Secondary | ICD-10-CM

## 2016-08-15 NOTE — Therapy (Signed)
[facility] [place], [place], [address] Phone: [phone]   Fax:  [phone]  Physical Therapy Treatment  Patient Details  Name: Patrick Brown MRN: [id number] Date of Birth: March 17, 1970 Referring Provider: Marchia Bond MD  Encounter Date: 08/15/2016      PT End of Session - 08/15/16 1451    Visit Number 20   Number of Visits 24   Date for PT Re-Evaluation 08/19/16   PT Start Time 4431   PT Stop Time 1511  2 units secondary to light treatment d/t fatigue   PT Time Calculation (min) 34 min   Activity Tolerance Patient tolerated treatment well   Behavior During Therapy [facility] for tasks assessed/performed      Past Medical History:  Diagnosis Date  . Anemia, iron deficiency On procrit  . CAP (community acquired pneumonia)   . CKD (chronic kidney disease) stage 3, GFR 30-59 ml/min   . Degenerative arthritis   . Depression   . Dyslipidemia   . Gastroesophageal reflux disease   . Gastroparesis diabeticorum ([place])   . Hematuria, microscopic 10/09   work up negative (Dr. Amalia Hailey)  . HIV positive ([place])   . 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 Hypothyroidism   . IDDM (insulin dependent diabetes mellitus) ([place])    38 years  . Low HDL (under 40)   . Proteinuria   . Retinopathy    x2  . SIRS (systemic inflammatory response syndrome) (HCC)     Past Surgical History:  Procedure Laterality Date  . CATARACT EXTRACTION Right   . COLONOSCOPY    . ESOPHAGOGASTRODUODENOSCOPY    . EYE SURGERY  2006,2001   x2   . HIP ARTHROPLASTY Right 05/10/2016   Procedure: RIGHT HIP HEMIARTHROPLASTY;  Surgeon: Marchia Bond, MD;  Location: [place];  Service: Orthopedics;  Laterality: Right;  . insulin pump    . LASIK  Bilateral   . VIDEO BRONCHOSCOPY Bilateral 12/15/2012   Procedure: VIDEO BRONCHOSCOPY WITH FLUORO;  Surgeon: Kathee Delton, MD;  [facility];  Service: Cardiopulmonary;  Laterality: Bilateral;  . VITRECTOMY  bilateral    There were no vitals filed for this visit.      Subjective Assessment - 08/15/16 1434    Subjective Patient reports hip fatigue from exercises yesterday and opting out of elliptical today.   Patient Stated Goals Get back to normal.   Currently in Pain? Yes   Pain Score 4    Pain Location Hip   Pain Orientation Right   Pain Descriptors / Indicators Tiring   Pain Type Surgical pain   Pain Onset More than a month ago            [facility] PT Assessment - 08/15/16 0001      Assessment   Medical Diagnosis Left hip hemiarthroplasty.   Onset Date/Surgical Date 05/10/16   Next MD Visit 08/28/2016     Restrictions   Weight Bearing Restrictions No                     [facility] Adult PT Treatment/Exercise - 08/15/16 0001      Knee/Hip Exercises: Aerobic   Stationary Bike L3 x8mn     Knee/Hip Exercises: Machines for Strengthening   Cybex Knee Extension 20# 3x15   Cybex Knee Flexion 40#  3x15   Cybex Leg Press 3 pl, (seat 6) x45 reps     Knee/Hip Exercises: Standing   Other Standing Knee Exercises Sidestepping in // bars with red theraband x5 reps                      PT Long Term Goals - 07/18/16 1531      PT LONG TERM GOAL #1   Title Independent with a HEP.   Time 6   Period Weeks   Status Achieved     PT LONG TERM GOAL #2   Title Increase left hip strength to a solid 5/5 to increase stability for functional activites.   Time 6   Period Weeks   Status On-going     PT LONG TERM GOAL #3   Title Perform a reciprocating stair gait.   Time 6   Period Weeks   Status Achieved     PT LONG TERM GOAL #4   Title Walk without deviation.   Time 6   Period Weeks   Status Partially Met     PT LONG TERM GOAL #5   Title  Perform ADL's with pain not > 3/10.   Time 6   Period Weeks   Status Achieved               Plan - 08/15/16 1513    Clinical Impression Statement Patient tolerated today's treatment well although he reported R hip fatigue from previous day treatment. Patient able to complete all exercises as directed although he reported more fatigue following leg press. Less strenuous side stepping completed with red theraband in parallel bars today with no complaint of discomfort from patient. Still able to maintain pain through Tylenol per patient report.   Rehab Potential Excellent   PT Frequency 2x / week   PT Duration 6 weeks   PT Treatment/Interventions ADLs/Self Care Home Management;Cryotherapy;Electrical Stimulation;Moist Heat;Stair training;Gait training;Functional mobility training;Therapeutic activities;Therapeutic exercise;Neuromuscular re-education;Manual techniques;Patient/family education   PT Next Visit Plan Continue with RLE strengthening with gait training as needed per MPT POC.      PT Home Exercise Plan HEP- SLR, standing hip abduction, bridging, sit to stands, squats to chair   Consulted and Agree with Plan of Care Patient      Patient will benefit from skilled therapeutic intervention in order to improve the following deficits and impairments:  Abnormal gait, Decreased activity tolerance, Decreased strength, Pain  Visit Diagnosis: Pain in right hip  Muscle weakness (generalized)     Problem List Patient Active Problem List   Diagnosis Date Noted  . Fever 05/14/2016  . S/P ORIF (open reduction internal fixation) fracture   . Closed right hip fracture, initial encounter (HCC) 05/09/2016  . Benign prostatic hyperplasia without lower urinary tract symptoms 04/18/2016  . Vitamin D deficiency 04/18/2016  . OSA (obstructive sleep apnea) 10/06/2014  . Screening examination for venereal disease 12/23/2013  . Pseudophakia of both eyes 06/23/2013  . Pain in joint, lower leg  11/19/2012  . Unspecified sinusitis (chronic) 09/16/2012  . Right groin wound 08/09/2012  . HIV disease (HCC) 06/02/2012  . Convulsions/seizures (HCC) 05/16/2012  . SIRS (systemic inflammatory response syndrome) (HCC) 04/18/2012  . Pulmonary infiltrate 04/18/2012  . Gastroparesis 04/18/2012  . Anemia, iron deficiency   . Type 1 diabetes mellitus (HCC) 05/08/2011  . Acquired hypothyroidism 05/08/2011  . Chronic kidney disease, stage 3, mod decreased GFR 12/31/2010    Evelene Croon, PTA 08/15/2016, 3:21 PM  [facility] [place], [place], [address] Phone: [phone]   Fax:  [phone]  Name: Cato Liburd MRN: [id number] Date of Birth: February 22, 1970

## 2016-08-19 ENCOUNTER — Ambulatory Visit: Payer: Medicare Other | Admitting: Family Medicine

## 2016-08-20 ENCOUNTER — Ambulatory Visit: Payer: Worker's Compensation | Admitting: Physical Therapy

## 2016-08-20 ENCOUNTER — Encounter: Payer: Self-pay | Admitting: Physical Therapy

## 2016-08-20 DIAGNOSIS — M6281 Muscle weakness (generalized): Secondary | ICD-10-CM

## 2016-08-20 DIAGNOSIS — M25551 Pain in right hip: Secondary | ICD-10-CM | POA: Diagnosis not present

## 2016-08-20 NOTE — Therapy (Signed)
[facility] [place], [place], [address] Phone: [phone]   Fax:  [phone]  Physical Therapy Treatment  Patient Details  Name: Patrick Brown MRN: [id number] Date of Birth: 1970-03-07 Referring Provider: Marchia Bond MD  Encounter Date: 08/20/2016      PT End of Session - 08/20/16 1503    Visit Number 21   Number of Visits 24   Date for PT Re-Evaluation 08/19/16   PT Start Time 1430   PT Stop Time 1514   PT Time Calculation (min) 44 min   Activity Tolerance Patient tolerated treatment well   Behavior During Therapy [facility] for tasks assessed/performed      Past Medical History:  Diagnosis Date  . Anemia, iron deficiency On procrit  . CAP (community acquired pneumonia)   . CKD (chronic kidney disease) stage 3, GFR 30-59 ml/min   . Degenerative arthritis   . Depression   . Dyslipidemia   . Gastroesophageal reflux disease   . Gastroparesis diabeticorum ([place])   . Hematuria, microscopic 10/09   work up negative (Dr. Amalia Hailey)  . HIV positive ([place])   . 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 Hypothyroidism   . IDDM (insulin dependent diabetes mellitus) ([place])    38 years  . Low HDL (under 40)   . Proteinuria   . Retinopathy    x2  . SIRS (systemic inflammatory response syndrome) (HCC)     Past Surgical History:  Procedure Laterality Date  . CATARACT EXTRACTION Right   . COLONOSCOPY    . ESOPHAGOGASTRODUODENOSCOPY    . EYE SURGERY  2006,2001   x2   . HIP ARTHROPLASTY Right 05/10/2016   Procedure: RIGHT HIP HEMIARTHROPLASTY;  Surgeon: Marchia Bond, MD;  Location: [place];  Service: Orthopedics;  Laterality: Right;  . insulin pump    . LASIK Bilateral   . VIDEO BRONCHOSCOPY Bilateral  12/15/2012   Procedure: VIDEO BRONCHOSCOPY WITH FLUORO;  Surgeon: Kathee Delton, MD;  [facility];  Service: Cardiopulmonary;  Laterality: Bilateral;  . VITRECTOMY  bilateral    There were no vitals filed for this visit.      Subjective Assessment - 08/20/16 1448    Subjective Reports that his hip feels fine and no pain.    Patient Stated Goals Get back to normal.   Currently in Pain? No/denies            [facility] PT Assessment - 08/20/16 0001      Assessment   Medical Diagnosis Left hip hemiarthroplasty.   Onset Date/Surgical Date 05/10/16   Next MD Visit 08/28/2016     Restrictions   Weight Bearing Restrictions No                     [facility] Adult PT Treatment/Exercise - 08/20/16 0001      Knee/Hip Exercises: Aerobic   Stationary Bike L3 x22 min     Knee/Hip Exercises: Machines for Strengthening   Cybex Knee Extension 20# 3x15   Cybex Knee Flexion 40# 3x15   Cybex Leg Press 3 pl, (seat 6) x50 reps     Knee/Hip Exercises: Standing   Wall Squat 1 set;10 reps                     PT Long Term  Goals - 07/18/16 1531      PT LONG TERM GOAL #1   Title Independent with a HEP.   Time 6   Period Weeks   Status Achieved     PT LONG TERM GOAL #2   Title Increase left hip strength to a solid 5/5 to increase stability for functional activites.   Time 6   Period Weeks   Status On-going     PT LONG TERM GOAL #3   Title Perform a reciprocating stair gait.   Time 6   Period Weeks   Status Achieved     PT LONG TERM GOAL #4   Title Walk without deviation.   Time 6   Period Weeks   Status Partially Met     PT LONG TERM GOAL #5   Title Perform ADL's with pain not > 3/10.   Time 6   Period Weeks   Status Achieved               Plan - 08/20/16 1556    Clinical Impression Statement Patient tolerated today's treatment well although he reported increased effort on stationary bike as well as exercises today. No increased pain  reported today by patient. Patient able to complete exercises with proper technique with only minimal multimodal cueing.    Rehab Potential Excellent   PT Frequency 2x / week   PT Duration 6 weeks   PT Treatment/Interventions ADLs/Self Care Home Management;Cryotherapy;Electrical Stimulation;Moist Heat;Stair training;Gait training;Functional mobility training;Therapeutic activities;Therapeutic exercise;Neuromuscular re-education;Manual techniques;Patient/family education   PT Next Visit Plan Continue with RLE strengthening with gait training as needed per MPT POC.      PT Home Exercise Plan HEP- SLR, standing hip abduction, bridging, sit to stands, squats to chair   Consulted and Agree with Plan of Care Patient      Patient will benefit from skilled therapeutic intervention in order to improve the following deficits and impairments:  Abnormal gait, Decreased activity tolerance, Decreased strength, Pain  Visit Diagnosis: Pain in right hip  Muscle weakness (generalized)     Problem List Patient Active Problem List   Diagnosis Date Noted  . Fever 05/14/2016  . S/P ORIF (open reduction internal fixation) fracture   . Closed right hip fracture, initial encounter ([place]) 05/09/2016  . Benign prostatic hyperplasia without lower urinary tract symptoms 04/18/2016  . Vitamin D deficiency 04/18/2016  . OSA (obstructive sleep apnea) 10/06/2014  . Screening examination for venereal disease 12/23/2013  . Pseudophakia of both eyes 06/23/2013  . Pain in joint, lower leg 11/19/2012  . Unspecified sinusitis (chronic) 09/16/2012  . Right groin wound 08/09/2012  . HIV disease ([place]) 06/02/2012  . Convulsions/seizures ([place]) 05/16/2012  . SIRS (systemic inflammatory response syndrome) ([place]) 04/18/2012  . Pulmonary infiltrate 04/18/2012  . Gastroparesis 04/18/2012  . Anemia, iron deficiency   . Type 1 diabetes mellitus ([place]) 05/08/2011  . Acquired hypothyroidism 05/08/2011  . Chronic kidney disease,  stage 3, mod decreased GFR 12/31/2010    Wynelle Fanny, PTA 08/20/2016, 4:39 PM  [facility] [place], [place], [address] Phone: [phone]   Fax:  [phone]  Name: Nikita Humble MRN: [id number] Date of Birth: 1970/06/22

## 2016-08-21 ENCOUNTER — Other Ambulatory Visit: Payer: Self-pay | Admitting: Family Medicine

## 2016-08-21 NOTE — Telephone Encounter (Signed)
Last seen 08/12/16  Dr Evette Doffing  [facility] PCP  IF approved route to nurse to call into [facility] [phone]

## 2016-08-22 ENCOUNTER — Ambulatory Visit: Payer: Worker's Compensation | Admitting: *Deleted

## 2016-08-22 DIAGNOSIS — M25552 Pain in left hip: Secondary | ICD-10-CM

## 2016-08-22 DIAGNOSIS — M25551 Pain in right hip: Secondary | ICD-10-CM | POA: Diagnosis not present

## 2016-08-22 DIAGNOSIS — M6281 Muscle weakness (generalized): Secondary | ICD-10-CM

## 2016-08-22 NOTE — Therapy (Signed)
[facility] [place], [place], [address] Phone: [phone]   Fax:  [phone]  Physical Therapy Treatment  Patient Details  Name: Patrick Brown MRN: [id number] Date of Birth: 1970/10/09 Referring Provider: Marchia Bond MD  Encounter Date: 08/22/2016      PT End of Session - 08/22/16 1729    Visit Number 22   Number of Visits 24   Date for PT Re-Evaluation 08/19/16   PT Start Time 2353   PT Stop Time 1435   PT Time Calculation (min) 50 min      Past Medical History:  Diagnosis Date  . Anemia, iron deficiency On procrit  . CAP (community acquired pneumonia)   . CKD (chronic kidney disease) stage 3, GFR 30-59 ml/min   . Degenerative arthritis   . Depression   . Dyslipidemia   . Gastroesophageal reflux disease   . Gastroparesis diabeticorum ([place])   . Hematuria, microscopic 10/09   work up negative (Dr. Amalia Hailey)  . HIV positive ([place])   . 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 Hypothyroidism   . IDDM (insulin dependent diabetes mellitus) ([place])    38 years  . Low HDL (under 40)   . Proteinuria   . Retinopathy    x2  . SIRS (systemic inflammatory response syndrome) ([facility])     Past Surgical History:  Procedure Laterality Date  . CATARACT EXTRACTION Right   . COLONOSCOPY    . ESOPHAGOGASTRODUODENOSCOPY    . EYE SURGERY  2006,2001   x2   . HIP ARTHROPLASTY Right 05/10/2016   Procedure: RIGHT HIP HEMIARTHROPLASTY;  Surgeon: Marchia Bond, MD;  Location: [facility];  Service: Orthopedics;  Laterality: Right;  . insulin pump    . LASIK Bilateral   . VIDEO BRONCHOSCOPY Bilateral 12/15/2012   Procedure: VIDEO BRONCHOSCOPY WITH FLUORO;  Surgeon: Kathee Delton, MD;  Location: [facility];  Service:  Cardiopulmonary;  Laterality: Bilateral;  . VITRECTOMY  bilateral    There were no vitals filed for this visit.      Subjective Assessment - 08/22/16 1421    Subjective My hip was hurting and aching after lying back at the dentist office and my legs were straight out   Patient Stated Goals Get back to normal.   Currently in Pain? Yes   Pain Score 2    Pain Location Hip   Pain Orientation Right   Pain Descriptors / Indicators Aching   Pain Type Surgical pain   Pain Onset More than a month ago                         [facility] Adult PT Treatment/Exercise - 08/22/16 0001      Knee/Hip Exercises: Aerobic   Stationary Bike L3 x39mn     Knee/Hip Exercises: Machines for Strengthening   Cybex Knee Extension 20# 3x15   Cybex Knee Flexion 40# 3x15     Knee/Hip Exercises: Standing   Lateral Step Up 3 sets;Right;10 reps;Step Height: 6"   Rocker Board 3 minutes  balance , on BOSU x 5 mins   SLS on floor,  2# ball/wall toss x 50                     PT Long Term Goals - 07/18/16  [place] GOAL #1   Title Independent with a HEP.   Time 6   Period Weeks   Status Achieved     PT LONG TERM GOAL #2   Title Increase left hip strength to a solid 5/5 to increase stability for functional activites.   Time 6   Period Weeks   Status On-going     PT LONG TERM GOAL #3   Title Perform a reciprocating stair gait.   Time 6   Period Weeks   Status Achieved     PT LONG TERM GOAL #4   Title Walk without deviation.   Time 6   Period Weeks   Status Partially Met     PT LONG TERM GOAL #5   Title Perform ADL's with pain not > 3/10.   Time 6   Period Weeks   Status Achieved               Plan - 08/22/16 1730    Clinical Impression Statement Pt arrived today with some increased soreness in his hip due to lying flat in a dentist chair and stretching his hip for a while. He was able to perform strengthening exs and balance act.'s today, but  struggled on SLS on RT LE.   Rehab Potential Excellent   PT Frequency 2x / week   PT Duration 6 weeks   PT Treatment/Interventions ADLs/Self Care Home Management;Cryotherapy;Electrical Stimulation;Moist Heat;Stair training;Gait training;Functional mobility training;Therapeutic activities;Therapeutic exercise;Neuromuscular re-education;Manual techniques;Patient/family education   PT Next Visit Plan Continue with RLE strengthening with gait training as needed per MPT POC.      PT Home Exercise Plan HEP- SLR, standing hip abduction, bridging, sit to stands, squats to chair   Consulted and Agree with Plan of Care Patient      Patient will benefit from skilled therapeutic intervention in order to improve the following deficits and impairments:  Abnormal gait, Decreased activity tolerance, Decreased strength, Pain  Visit Diagnosis: Pain in right hip  Muscle weakness (generalized)  Pain in left hip     Problem List Patient Active Problem List   Diagnosis Date Noted  . Fever 05/14/2016  . S/P ORIF (open reduction internal fixation) fracture   . Closed right hip fracture, initial encounter ([place]) 05/09/2016  . Benign prostatic hyperplasia without lower urinary tract symptoms 04/18/2016  . Vitamin D deficiency 04/18/2016  . OSA (obstructive sleep apnea) 10/06/2014  . Screening examination for venereal disease 12/23/2013  . Pseudophakia of both eyes 06/23/2013  . Pain in joint, lower leg 11/19/2012  . Unspecified sinusitis (chronic) 09/16/2012  . Right groin wound 08/09/2012  . HIV disease ([place]) 06/02/2012  . Convulsions/seizures ([place]) 05/16/2012  . SIRS (systemic inflammatory response syndrome) ([place]) 04/18/2012  . Pulmonary infiltrate 04/18/2012  . Gastroparesis 04/18/2012  . Anemia, iron deficiency   . Type 1 diabetes mellitus ([place]) 05/08/2011  . Acquired hypothyroidism 05/08/2011  . Chronic kidney disease, stage 3, mod decreased GFR 12/31/2010    Kourtni Stineman,CHRIS, PTA 08/22/2016,  5:34 PM  [facility] [address] [place], [place], [address] Phone: [phone]   Fax:  [phone]  Name: Patrick Brown MRN: [id number] Date of Birth: 1970-05-13

## 2016-08-27 ENCOUNTER — Ambulatory Visit: Payer: Worker's Compensation | Admitting: *Deleted

## 2016-08-27 DIAGNOSIS — M25551 Pain in right hip: Secondary | ICD-10-CM | POA: Diagnosis not present

## 2016-08-27 DIAGNOSIS — M25552 Pain in left hip: Secondary | ICD-10-CM

## 2016-08-27 DIAGNOSIS — M6281 Muscle weakness (generalized): Secondary | ICD-10-CM

## 2016-08-27 NOTE — Therapy (Signed)
[facility] [place], [place], [address] Phone: [phone]   Fax:  [phone]  Physical Therapy Treatment  Patient Details  Name: Patrick Brown MRN: [id number] Date of Birth: [birth year] Referring Provider: Marchia Bond MD  Encounter Date: 08/27/2016      PT End of Session - 08/27/16 1222    Visit Number 23   Number of Visits 24   Date for PT Re-Evaluation 08/19/16   PT Start Time 1200   PT Stop Time 1250   PT Time Calculation (min) 50 min      Past Medical History:  Diagnosis Date  . Anemia, iron deficiency On procrit  . CAP (community acquired pneumonia)   . CKD (chronic kidney disease) stage 3, GFR 30-59 ml/min   . Degenerative arthritis   . Depression   . Dyslipidemia   . Gastroesophageal reflux disease   . Gastroparesis diabeticorum ([place])   . Hematuria, microscopic 10/09   work up negative (Dr. Amalia Hailey)  . HIV positive ([place])   . 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 Hypothyroidism   . IDDM (insulin dependent diabetes mellitus) ([place])    38 years  . Low HDL (under 40)   . Proteinuria   . Retinopathy    x2  . SIRS (systemic inflammatory response syndrome) ([facility])     Past Surgical History:  Procedure Laterality Date  . CATARACT EXTRACTION Right   . COLONOSCOPY    . ESOPHAGOGASTRODUODENOSCOPY    . EYE SURGERY  2006,2001   x2   . HIP ARTHROPLASTY Right 05/10/2016   Procedure: RIGHT HIP HEMIARTHROPLASTY;  Surgeon: Marchia Bond, MD;  Location: [facility];  Service: Orthopedics;  Laterality: Right;  . insulin pump    . LASIK Bilateral   . VIDEO BRONCHOSCOPY Bilateral 12/15/2012   Procedure: VIDEO BRONCHOSCOPY WITH FLUORO;  Surgeon: Kathee Delton, MD;  [facility];  Service:  Cardiopulmonary;  Laterality: Bilateral;  . VITRECTOMY  bilateral    There were no vitals filed for this visit.      Subjective Assessment - 08/27/16 1221    Subjective Doing much better. No pain today   Patient Stated Goals Get back to normal.   Currently in Pain? No/denies   Pain Orientation Right                         [facility] Adult PT Treatment/Exercise - 08/27/16 0001      Knee/Hip Exercises: Aerobic   Stationary Bike L3 x40mn     Knee/Hip Exercises: Standing   Lateral Step Up 3 sets;Right;10 reps;Step Height: 6"   Forward Step Up Right;3 sets;10 reps;Step Height: 6"   Rocker Board 3 minutes  balance , on BOSU x 5 mins   SLS on floor,  2# ball/wall toss x 50     Knee/Hip Exercises: Seated   Sit to Sand 2 sets;10 reps     Gait in clinic with normal gait pattern today.                PT Long Term Goals - 08/27/16 1223      PT LONG TERM GOAL #1   Title Independent with a HEP.   Period Weeks   Status Achieved     PT LONG TERM GOAL #2  Title Increase left hip strength to a solid 5/5 to increase stability for functional activites.  4/5 Hip abduction   Status Not Met     PT LONG TERM GOAL #3   Title Perform a reciprocating stair gait.   Time 6   Period Weeks   Status Achieved     PT LONG TERM GOAL #4   Title Walk without deviation.   Time 6   Period Weeks   Status Partially Met     PT LONG TERM GOAL #5   Title Perform ADL's with pain not > 3/10.   Time 6   Period Weeks   Status Achieved               Plan - 08/27/16 1249    Clinical Impression Statement Pt arrived to clinic doing much better than last Rx. He had no hip pain and was able to complete all therex for RT LE without complaints. His AROM for hip flexion was 100 degrees in supine. He was able to meet most of the LTGs but some were not met due to weakness 4/5 hip abduction and mild intermittent gait deviations. He has a F/U appt with MD tomorrow.   Rehab  Potential Excellent   PT Frequency 2x / week   PT Duration 6 weeks   PT Treatment/Interventions ADLs/Self Care Home Management;Cryotherapy;Electrical Stimulation;Moist Heat;Stair training;Gait training;Functional mobility training;Therapeutic activities;Therapeutic exercise;Neuromuscular re-education;Manual techniques;Patient/family education   PT Next Visit Plan Continue with RLE strengthening with gait training as needed per MPT POC.      PT Home Exercise Plan HEP- SLR, standing hip abduction, bridging, sit to stands, squats to chair   Consulted and Agree with Plan of Care Patient      Patient will benefit from skilled therapeutic intervention in order to improve the following deficits and impairments:  Abnormal gait, Decreased activity tolerance, Decreased strength, Pain  Visit Diagnosis: Pain in right hip  Muscle weakness (generalized)  Pain in left hip     Problem List Patient Active Problem List   Diagnosis Date Noted  . Fever 05/14/2016  . S/P ORIF (open reduction internal fixation) fracture   . Closed right hip fracture, initial encounter ([place]) 05/09/2016  . Benign prostatic hyperplasia without lower urinary tract symptoms 04/18/2016  . Vitamin D deficiency 04/18/2016  . OSA (obstructive sleep apnea) 10/06/2014  . Screening examination for venereal disease 12/23/2013  . Pseudophakia of both eyes 06/23/2013  . Pain in joint, lower leg 11/19/2012  . Unspecified sinusitis (chronic) 09/16/2012  . Right groin wound 08/09/2012  . HIV disease ([place]) 06/02/2012  . Convulsions/seizures ([place]) 05/16/2012  . SIRS (systemic inflammatory response syndrome) ([place]) 04/18/2012  . Pulmonary infiltrate 04/18/2012  . Gastroparesis 04/18/2012  . Anemia, iron deficiency   . Type 1 diabetes mellitus ([place]) 05/08/2011  . Acquired hypothyroidism 05/08/2011  . Chronic kidney disease, stage 3, mod decreased GFR 12/31/2010    Brown, Patrick, PTA 08/27/2016, 1:42 PM Patrick Brown MPT [facility] [address] [place], [place], [address] Phone: [phone]   Fax:  [phone]  Name: Patrick Brown MRN: [id number] Date of Birth: 11-14-1970

## 2016-08-29 ENCOUNTER — Encounter: Payer: Self-pay | Admitting: *Deleted

## 2016-09-02 ENCOUNTER — Encounter: Payer: Self-pay | Admitting: Family Medicine

## 2016-09-02 ENCOUNTER — Ambulatory Visit (INDEPENDENT_AMBULATORY_CARE_PROVIDER_SITE_OTHER): Payer: Medicare Other | Admitting: Family Medicine

## 2016-09-02 VITALS — BP 132/89 | HR 104 | Temp 97.5°F | Ht 70.0 in | Wt 203.0 lb

## 2016-09-02 DIAGNOSIS — N183 Chronic kidney disease, stage 3 unspecified: Secondary | ICD-10-CM

## 2016-09-02 DIAGNOSIS — E349 Endocrine disorder, unspecified: Secondary | ICD-10-CM | POA: Diagnosis not present

## 2016-09-02 DIAGNOSIS — E559 Vitamin D deficiency, unspecified: Secondary | ICD-10-CM | POA: Diagnosis not present

## 2016-09-02 DIAGNOSIS — E7800 Pure hypercholesterolemia, unspecified: Secondary | ICD-10-CM

## 2016-09-02 DIAGNOSIS — D509 Iron deficiency anemia, unspecified: Secondary | ICD-10-CM

## 2016-09-02 DIAGNOSIS — B20 Human immunodeficiency virus [HIV] disease: Secondary | ICD-10-CM | POA: Diagnosis not present

## 2016-09-02 DIAGNOSIS — E1065 Type 1 diabetes mellitus with hyperglycemia: Secondary | ICD-10-CM

## 2016-09-02 DIAGNOSIS — IMO0002 Reserved for concepts with insufficient information to code with codable children: Secondary | ICD-10-CM

## 2016-09-02 DIAGNOSIS — Z0000 Encounter for general adult medical examination without abnormal findings: Secondary | ICD-10-CM

## 2016-09-02 DIAGNOSIS — N400 Enlarged prostate without lower urinary tract symptoms: Secondary | ICD-10-CM | POA: Diagnosis not present

## 2016-09-02 DIAGNOSIS — E1022 Type 1 diabetes mellitus with diabetic chronic kidney disease: Secondary | ICD-10-CM | POA: Diagnosis not present

## 2016-09-02 MED ORDER — TRAMADOL HCL 50 MG PO TABS: 50.0000 mg | ORAL_TABLET | Freq: Two times a day (BID) | ORAL | 0 refills | Status: DC | PRN

## 2016-09-02 MED ORDER — CYCLOBENZAPRINE HCL 10 MG PO TABS: 10.0000 mg | ORAL_TABLET | Freq: Three times a day (TID) | ORAL | 0 refills | Status: DC | PRN

## 2016-09-02 NOTE — Patient Instructions (Addendum)
Medicare Annual Wellness Visit  [facility] and the medical providers at [facility] strive to bring you the best medical care.  In doing so we not only want to address your current medical conditions and concerns but also to detect new conditions early and prevent illness, disease and health-related problems.    Medicare offers a yearly Wellness Visit which allows our clinical staff to assess your need for preventative services including immunizations, lifestyle education, counseling to decrease risk of preventable diseases and screening for fall risk and other medical concerns.    This visit is provided free of charge (no copay) for all Medicare recipients. The clinical pharmacists at [facility] have begun to conduct these Wellness Visits which will also include a thorough review of all your medications.    As you primary medical provider recommend that you make an appointment for your Annual Wellness Visit if you have not done so already this year.  You may set up this appointment before you leave today or you may call back ([phone]) and schedule an appointment.  Please make sure when you call that you mention that you are scheduling your Annual Wellness Visit with the clinical pharmacist so that the appointment may be made for the proper length of time.     Continue current medications. Continue good therapeutic lifestyle changes which include good diet and exercise. Fall precautions discussed with patient. If an FOBT was given today- please return it to our front desk. If you are over 1 years old - you may need Prevnar 46 or the adult Pneumonia vaccine.  **Flu shots are available--- please call and schedule a FLU-CLINIC appointment**  After your visit with [place] today you will receive a survey in the mail or online from [employer] regarding your care with [place]. Please take a moment to fill this out. Your feedback is very  important to [place] as you can help [place] better understand your patient needs as well as improve your experience and satisfaction. WE CARE ABOUT YOU!!!   Continue to follow-up with infectious disease Continue to follow-up with nephrology and Dr. Antionette Fairy Continue to follow-up with orthopedic surgeon regarding right hip strain Stay active physically and watch diet closely We will call with lab work results as soon as these results become available  Continue to follow-up with ophthalmology

## 2016-09-02 NOTE — Progress Notes (Signed)
Subjective:    Patient ID: Patrick Brown, male    DOB: 06/24/1970, 46 y.o.   MRN: [id number]  HPI  Pt here for follow up and management of chronic medical problems which includes diabetes, anemia and hyperlipidemia. He is taking medication regularly.Patient is followed regularly by the ophthalmologist and sees him about every 9 months. As Dr. Zadie Rhine. The patient also has had a strain of his right hip and is currently being followed by the orthopedic surgeon with rest and time and hoping this will get better without any further treatment. He denies any chest pain or shortness of breath. He did have a stress test a couple of years ago and this was normal. He denies any trouble with his stomach including nausea vomiting diarrhea blood in the stool or black tarry bowel movements. He's passing his water without problems. He does see the nephrologist, Dr. Janelle Floor in [place] and he sees him regularly and is able to see the blood work that we do on him and affect. The patient has had a colonoscopy and is not sure of the last date of this there is no family history of colon cancer and his last colonoscopy was within normal limits. The patient will be given an FOBT to return and he will get lab work also when he returns fasting. He will also get a urine microalbumin to creatinine ratio.   Patient Active Problem List   Diagnosis Date Noted  . Fever 05/14/2016  . S/P ORIF (open reduction internal fixation) fracture   . Closed right hip fracture, initial encounter ([place]) 05/09/2016  . Benign prostatic hyperplasia without lower urinary tract symptoms 04/18/2016  . Vitamin D deficiency 04/18/2016  . OSA (obstructive sleep apnea) 10/06/2014  . Screening examination for venereal disease 12/23/2013  . Pseudophakia of both eyes 06/23/2013  . Pain in joint, lower leg 11/19/2012  . Unspecified sinusitis (chronic) 09/16/2012  . Right groin wound 08/09/2012  . HIV disease ([place]) 06/02/2012  .  Convulsions/seizures ([place]) 05/16/2012  . SIRS (systemic inflammatory response syndrome) ([place]) 04/18/2012  . Pulmonary infiltrate 04/18/2012  . Gastroparesis 04/18/2012  . Anemia, iron deficiency   . Type 1 diabetes mellitus ([place]) 05/08/2011  . Acquired hypothyroidism 05/08/2011  . Chronic kidney disease, stage 3, mod decreased GFR 12/31/2010   Outpatient Encounter Prescriptions as of 09/02/2016  Medication Sig  . abacavir-lamiVUDine (EPZICOM) 600-300 MG tablet Take 1 tablet by mouth daily.  Marland Kitchen acetaminophen (TYLENOL) 500 MG tablet Take 1,000 mg by mouth 2 (two) times daily.   Marland Kitchen acyclovir (ZOVIRAX) 400 MG tablet TAKE 1 TABLET (400 MG TOTAL) BY MOUTH 2 (TWO) TIMES DAILY.  Marland Kitchen ANDROGEL PUMP 20.25 MG/ACT (1.62%) GEL APPLY 2 PUMPS DAILY AS DIRECTED  . aspirin 81 MG chewable tablet Chew 81 mg by mouth every morning.  . dolutegravir (TIVICAY) 50 MG tablet Take 1 tablet (50 mg total) by mouth daily.  . DULoxetine (CYMBALTA) 60 MG capsule TAKE 1 CAPSULE (60 MG TOTAL) BY MOUTH 2 (TWO) TIMES DAILY. AS DIRECTED  . esomeprazole (NEXIUM) 40 MG capsule TAKE ONE CAPSULE BY MOUTH DAILY  . ferrous sulfate 325 (65 FE) MG tablet Take 650 mg by mouth daily with breakfast.   . fludrocortisone (FLORINEF) 0.1 MG tablet Take 0.1 mg by mouth daily.   . fluocinonide cream (LIDEX) 4.09 % Apply 1 application topically 2 (two) times daily.  . fluticasone (FLONASE) 50 MCG/ACT nasal spray Place 2 sprays into both nostrils daily.  . furosemide (LASIX) 40 MG tablet Take  20 mg by mouth 2 (two) times daily.  . Glucosamine-Chondroit-Vit C-Mn (GLUCOSAMINE 1500 COMPLEX PO) Take 1 tablet by mouth 3 (three) times daily.   Marland Kitchen glucose blood test strip Test BS QID and PRN.  . insulin lispro (HUMALOG) 100 UNIT/ML injection Use 42units to 120 units PER PUMP daily  AS DIRECTED  . Lancets MISC Test BS QID and PRN. E10.65  . levothyroxine (SYNTHROID, LEVOTHROID) 175 MCG tablet TAKE 1 TABLET (175 MCG TOTAL) BY MOUTH DAILY BEFORE BREAKFAST.    Marland Kitchen LORazepam (ATIVAN) 0.5 MG tablet TAKE 1 TABLET BY MOUTH EVERY DAY AS NEEDED  . metoCLOPramide (REGLAN) 5 MG tablet TAKE 1 TABLET BY MOUTH 3 TIMES A DAY BEFORE MEALS  . niacin (NIASPAN) 1000 MG CR tablet TAKE 1 TABLET (1,000 MG TOTAL) BY MOUTH AT BEDTIME. (Patient taking differently: TAKE 1 TABLET (1,500 MG TOTAL) BY MOUTH AT BEDTIME.)  . omega-3 acid ethyl esters (LOVAZA) 1 g capsule TAKE 2 CAPSULES (2 G TOTAL) BY MOUTH 2 (TWO) TIMES DAILY.  Marland Kitchen ondansetron (ZOFRAN) 4 MG tablet Take 1 tablet (4 mg total) by mouth every 8 (eight) hours as needed for nausea.  Marland Kitchen OVER THE COUNTER MEDICATION Take 1 capsule by mouth daily. [name]- probiotic daily  . simvastatin (ZOCOR) 40 MG tablet TAKE 1 TABLET (40 MG TOTAL) BY MOUTH AT BEDTIME.  . traMADol (ULTRAM) 50 MG tablet TAKE 1 TABLET BY MOUTH EVERY 6 HOURS AS NEEDED FOR PAIN  . ULORIC 40 MG tablet TAKE 1 TABLET BY MOUTH EVERY DAY  . [DISCONTINUED] baclofen (LIORESAL) 10 MG tablet Take 1 tablet (10 mg total) by mouth 3 (three) times daily. As needed for muscle spasm  . [DISCONTINUED] enoxaparin (LOVENOX) 30 MG/0.3ML injection Inject 0.3 mLs (30 mg total) into the skin daily.  . [DISCONTINUED] Nutritional Supplements (FEEDING SUPPLEMENT, NEPRO CARB STEADY,) LIQD Take 237 mLs by mouth at bedtime.  . [DISCONTINUED] oxyCODONE-acetaminophen (ROXICET) 5-325 MG tablet Take 1-2 tablets by mouth every 6 (six) hours as needed for severe pain.  . [DISCONTINUED] polyethylene glycol (MIRALAX / GLYCOLAX) packet Take 17 g by mouth daily as needed for mild constipation.  . [DISCONTINUED] senna (SENOKOT) 8.6 MG TABS tablet Take 1 tablet (8.6 mg total) by mouth 2 (two) times daily.  . [DISCONTINUED] sennosides-docusate sodium (SENOKOT-S) 8.6-50 MG tablet Take 2 tablets by mouth daily.  . [DISCONTINUED] triamcinolone (KENALOG) 0.025 % cream Apply 1 application topically 2 (two) times daily.   No facility-administered encounter medications on file as of 09/02/2016.        Review of Systems  Constitutional: Negative.   HENT: Negative.   Eyes: Negative.   Respiratory: Negative.   Cardiovascular: Negative.   Gastrointestinal: Negative.   Endocrine: Negative.   Genitourinary: Negative.   Musculoskeletal: Negative.   Skin: Negative.   Allergic/Immunologic: Negative.   Neurological: Negative.   Hematological: Negative.   Psychiatric/Behavioral: Negative.        Objective:   Physical Exam  Constitutional: He is oriented to person, place, and time. He appears well-developed and well-nourished. No distress.  The patient is pleasant and alert and feeling well and keeping up with all his medical conditions and issues.  HENT:  Head: Normocephalic and atraumatic.  Right Ear: External ear normal.  Left Ear: External ear normal.  Nose: Nose normal.  Mouth/Throat: Oropharynx is clear and moist. No oropharyngeal exudate.  Eyes: Pupils are equal, round, and reactive to light. Conjunctivae and EOM are normal. Right eye exhibits no discharge. Left eye exhibits no discharge. No  scleral icterus.  The patient sees the ophthalmologist, Dr. Zadie Rhine regularly because of cataracts and recent laser surgery.  Neck: Normal range of motion. Neck supple. No thyromegaly present.  No bruits thyromegaly or anterior cervical adenopathy  Cardiovascular: Normal rate, regular rhythm, normal heart sounds and intact distal pulses.   No murmur heard. Heart is regular at 84/m  Pulmonary/Chest: Effort normal and breath sounds normal. No respiratory distress. He has no wheezes. He has no rales. He exhibits no tenderness.  No axillary adenopathy  Abdominal: Soft. Bowel sounds are normal. He exhibits no mass. There is no tenderness. There is no rebound and no guarding.  No abdominal tenderness masses or organ enlargement or bruits and no inguinal adenopathy  Musculoskeletal: Normal range of motion. He exhibits no edema or tenderness.  Lymphadenopathy:    He has no cervical  adenopathy.  Neurological: He is alert and oriented to person, place, and time. He has normal reflexes. No cranial nerve deficit.  Skin: Skin is warm and dry. No rash noted.  Psychiatric: He has a normal mood and affect. His behavior is normal. Judgment and thought content normal.  Nursing note and vitals reviewed.  BP 132/89 (BP Location: Right Arm)   Pulse (!) 104   Temp (!) 97.5 F (36.4 C) (Oral)   Ht '5\' 10"'$  (1.778 m)   Wt 203 lb (92.1 kg)   BMI 29.13 kg/m         Assessment & Plan:  1. Pure hypercholesterolemia -Continue current treatment and aggressive therapeutic lifestyle changes pending results of lab work - CBC with Differential/Platelet; Future - Hepatic function panel; Future - Lipid panel; Future  2. Vitamin D deficiency -Continue vitamin D replacement pending results of lab work - CBC with Differential/Platelet; Future - VITAMIN D 25 Hydroxy (Vit-D Deficiency, Fractures); Future  3. Chronic kidney disease, stage 3, mod decreased GFR -Continue to follow-up with nephrology and Dr. Antionette Fairy - BMP8+EGFR; Future - CBC with Differential/Platelet; Future  4. Uncontrolled type 1 diabetes mellitus with stage 3 chronic kidney disease ([place]) -Continue with aggressive therapeutic lifestyle changes and is type a blood sugar control as possible with diet and exercise - BMP8+EGFR; Future - CBC with Differential/Platelet; Future - Bayer DCA Hb A1c Waived; Future - Microalbumin / creatinine urine ratio  5. Iron deficiency anemia, unspecified iron deficiency anemia type - CBC with Differential/Platelet; Future  6. Benign prostatic hyperplasia without lower urinary tract symptoms -No complaints today with BPH - CBC with Differential/Platelet; Future  7. Testosterone deficiency -No complaints with erectile dysfunction - CBC with Differential/Platelet; Future  8. HIV disease ([place]) -Continue to follow-up with infectious disease - CBC with Differential/Platelet;  Future  9. Healthcare maintenance - Thyroid Panel With TSH; Future No orders of the defined types were placed in this encounter.  Patient Instructions                       Medicare Annual Wellness Visit  [facility] and the medical providers at [facility] strive to bring you the best medical care.  In doing so we not only want to address your current medical conditions and concerns but also to detect new conditions early and prevent illness, disease and health-related problems.    Medicare offers a yearly Wellness Visit which allows our clinical staff to assess your need for preventative services including immunizations, lifestyle education, counseling to decrease risk of preventable diseases and screening for fall risk and other medical concerns.  This visit is provided free of charge (no copay) for all Medicare recipients. The clinical pharmacists at [facility] have begun to conduct these Wellness Visits which will also include a thorough review of all your medications.    As you primary medical provider recommend that you make an appointment for your Annual Wellness Visit if you have not done so already this year.  You may set up this appointment before you leave today or you may call back ([phone]) and schedule an appointment.  Please make sure when you call that you mention that you are scheduling your Annual Wellness Visit with the clinical pharmacist so that the appointment may be made for the proper length of time.     Continue current medications. Continue good therapeutic lifestyle changes which include good diet and exercise. Fall precautions discussed with patient. If an FOBT was given today- please return it to our front desk. If you are over 22 years old - you may need Prevnar 62 or the adult Pneumonia vaccine.  **Flu shots are available--- please call and schedule a FLU-CLINIC appointment**  After your visit with [place] today  you will receive a survey in the mail or online from [employer] regarding your care with [place]. Please take a moment to fill this out. Your feedback is very important to [place] as you can help [place] better understand your patient needs as well as improve your experience and satisfaction. WE CARE ABOUT YOU!!!   Continue to follow-up with infectious disease Continue to follow-up with nephrology and Dr. Antionette Fairy Continue to follow-up with orthopedic surgeon regarding right hip strain Stay active physically and watch diet closely We will call with lab work results as soon as these results become available  Continue to follow-up with ophthalmology   Arrie Senate MD

## 2016-09-02 NOTE — Addendum Note (Signed)
Addended by: Zannie Cove on: 09/02/2016 03:54 PM   Modules accepted: Orders

## 2016-09-03 LAB — MICROALBUMIN / CREATININE URINE RATIO
Creatinine, Urine: 88.9 mg/dL
MICROALB/CREAT RATIO: 296.2 mg/g{creat} — AB (ref 0.0–30.0)
MICROALBUM., U, RANDOM: 263.3 ug/mL

## 2016-09-04 ENCOUNTER — Other Ambulatory Visit: Payer: Medicare Other

## 2016-09-04 DIAGNOSIS — E1022 Type 1 diabetes mellitus with diabetic chronic kidney disease: Secondary | ICD-10-CM | POA: Diagnosis not present

## 2016-09-04 DIAGNOSIS — E1065 Type 1 diabetes mellitus with hyperglycemia: Secondary | ICD-10-CM | POA: Diagnosis not present

## 2016-09-04 DIAGNOSIS — E7800 Pure hypercholesterolemia, unspecified: Secondary | ICD-10-CM

## 2016-09-04 DIAGNOSIS — IMO0002 Reserved for concepts with insufficient information to code with codable children: Secondary | ICD-10-CM

## 2016-09-04 DIAGNOSIS — E559 Vitamin D deficiency, unspecified: Secondary | ICD-10-CM | POA: Diagnosis not present

## 2016-09-04 DIAGNOSIS — N183 Chronic kidney disease, stage 3 unspecified: Secondary | ICD-10-CM

## 2016-09-04 DIAGNOSIS — D509 Iron deficiency anemia, unspecified: Secondary | ICD-10-CM

## 2016-09-04 DIAGNOSIS — E349 Endocrine disorder, unspecified: Secondary | ICD-10-CM

## 2016-09-04 DIAGNOSIS — B20 Human immunodeficiency virus [HIV] disease: Secondary | ICD-10-CM

## 2016-09-04 DIAGNOSIS — N400 Enlarged prostate without lower urinary tract symptoms: Secondary | ICD-10-CM

## 2016-09-04 DIAGNOSIS — Z0000 Encounter for general adult medical examination without abnormal findings: Secondary | ICD-10-CM

## 2016-09-04 LAB — BAYER DCA HB A1C WAIVED: HB A1C (BAYER DCA - WAIVED): 7.3 % — ABNORMAL HIGH (ref <7.0)

## 2016-09-05 LAB — CBC WITH DIFFERENTIAL/PLATELET
BASOS: 1 %
Basophils Absolute: 0.1 10*3/uL (ref 0.0–0.2)
EOS (ABSOLUTE): 0.3 10*3/uL (ref 0.0–0.4)
EOS: 5 %
HEMATOCRIT: 42.9 % (ref 37.5–51.0)
HEMOGLOBIN: 14.6 g/dL (ref 13.0–17.7)
IMMATURE GRANULOCYTES: 1 %
Immature Grans (Abs): 0 10*3/uL (ref 0.0–0.1)
LYMPHS ABS: 3 10*3/uL (ref 0.7–3.1)
Lymphs: 48 %
MCH: 31.9 pg (ref 26.6–33.0)
MCHC: 34 g/dL (ref 31.5–35.7)
MCV: 94 fL (ref 79–97)
MONOCYTES: 9 %
MONOS ABS: 0.6 10*3/uL (ref 0.1–0.9)
NEUTROS PCT: 36 %
Neutrophils Absolute: 2.3 10*3/uL (ref 1.4–7.0)
Platelets: 260 10*3/uL (ref 150–379)
RBC: 4.57 x10E6/uL (ref 4.14–5.80)
RDW: 14.7 % (ref 12.3–15.4)
WBC: 6.3 10*3/uL (ref 3.4–10.8)

## 2016-09-05 LAB — THYROID PANEL WITH TSH
Free Thyroxine Index: 1.6 (ref 1.2–4.9)
T3 Uptake Ratio: 23 % — ABNORMAL LOW (ref 24–39)
T4, Total: 7.1 ug/dL (ref 4.5–12.0)
TSH: 4.72 u[IU]/mL — ABNORMAL HIGH (ref 0.450–4.500)

## 2016-09-05 LAB — LIPID PANEL
CHOL/HDL RATIO: 4 ratio (ref 0.0–5.0)
CHOLESTEROL TOTAL: 161 mg/dL (ref 100–199)
HDL: 40 mg/dL (ref >39)
LDL CALC: 79 mg/dL (ref 0–99)
TRIGLYCERIDES: 210 mg/dL — AB (ref 0–149)
VLDL CHOLESTEROL CAL: 42 mg/dL — AB (ref 5–40)

## 2016-09-05 LAB — BMP8+EGFR
BUN / CREAT RATIO: 20 (ref 9–20)
BUN: 33 mg/dL — AB (ref 6–24)
CHLORIDE: 98 mmol/L (ref 96–106)
CO2: 25 mmol/L (ref 20–29)
CREATININE: 1.64 mg/dL — AB (ref 0.76–1.27)
Calcium: 9.5 mg/dL (ref 8.7–10.2)
GFR calc Af Amer: 57 mL/min/{1.73_m2} — ABNORMAL LOW (ref >59)
GFR calc non Af Amer: 49 mL/min/{1.73_m2} — ABNORMAL LOW (ref >59)
GLUCOSE: 158 mg/dL — AB (ref 65–99)
Potassium: 4.7 mmol/L (ref 3.5–5.2)
SODIUM: 138 mmol/L (ref 134–144)

## 2016-09-05 LAB — VITAMIN D 25 HYDROXY (VIT D DEFICIENCY, FRACTURES): Vit D, 25-Hydroxy: 26.8 ng/mL — ABNORMAL LOW (ref 30.0–100.0)

## 2016-09-05 LAB — HEPATIC FUNCTION PANEL
ALBUMIN: 4.4 g/dL (ref 3.5–5.5)
ALK PHOS: 113 IU/L (ref 39–117)
ALT: 39 IU/L (ref 0–44)
AST: 33 IU/L (ref 0–40)
BILIRUBIN TOTAL: 0.3 mg/dL (ref 0.0–1.2)
Bilirubin, Direct: 0.08 mg/dL (ref 0.00–0.40)
Total Protein: 7.1 g/dL (ref 6.0–8.5)

## 2016-09-08 ENCOUNTER — Other Ambulatory Visit: Payer: Self-pay | Admitting: Family Medicine

## 2016-09-15 ENCOUNTER — Other Ambulatory Visit: Payer: Self-pay | Admitting: Family Medicine

## 2016-09-17 ENCOUNTER — Encounter: Payer: Self-pay | Admitting: *Deleted

## 2016-09-19 ENCOUNTER — Ambulatory Visit: Payer: Worker's Compensation | Attending: Family Medicine | Admitting: Physical Therapy

## 2016-09-19 DIAGNOSIS — M6281 Muscle weakness (generalized): Secondary | ICD-10-CM

## 2016-09-19 DIAGNOSIS — M25552 Pain in left hip: Secondary | ICD-10-CM | POA: Diagnosis present

## 2016-09-19 DIAGNOSIS — M25551 Pain in right hip: Secondary | ICD-10-CM | POA: Diagnosis not present

## 2016-09-19 NOTE — Therapy (Addendum)
[facility] [place], [place], [address] Phone: [phone]   Fax:  [phone]  Physical Therapy Treatment  Patient Details  Name: Patrick Brown MRN: [id number] Date of Birth: 01-13-1971 Referring Provider: Marchia Bond MD  Encounter Date: 09/19/2016      PT End of Session - 09/19/16 0925    Visit Number 24   Number of Visits 30   Date for PT Re-Evaluation 10/07/16   PT Start Time 0900   PT Stop Time 0946   PT Time Calculation (min) 46 min   Activity Tolerance Patient tolerated treatment well   Behavior During Therapy [facility] for tasks assessed/performed      Past Medical History:  Diagnosis Date  . Anemia, iron deficiency On procrit  . CAP (community acquired pneumonia)   . CKD (chronic kidney disease) stage 3, GFR 30-59 ml/min   . Degenerative arthritis   . Depression   . Dyslipidemia   . Gastroesophageal reflux disease   . Gastroparesis diabeticorum ([place])   . Hematuria, microscopic 10/09   work up negative (Dr. Amalia Hailey)  . HIV positive ([place])   . 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 Hypothyroidism   . IDDM (insulin dependent diabetes mellitus) ([place])    38 years  . Low HDL (under 40)   . Proteinuria   . Retinopathy    x2  . SIRS (systemic inflammatory response syndrome) ([facility])     Past Surgical History:  Procedure Laterality Date  . CATARACT EXTRACTION Right   . COLONOSCOPY    . ESOPHAGOGASTRODUODENOSCOPY    . EYE SURGERY  2006,2001   x2   . HIP ARTHROPLASTY Right 05/10/2016   Procedure: RIGHT HIP HEMIARTHROPLASTY;  Surgeon: Marchia Bond, MD;  Location: [facility];  Service: Orthopedics;  Laterality: Right;  . insulin pump    . LASIK Bilateral   . VIDEO BRONCHOSCOPY Bilateral  12/15/2012   Procedure: VIDEO BRONCHOSCOPY WITH FLUORO;  Surgeon: Kathee Delton, MD;  [facility];  Service: Cardiopulmonary;  Laterality: Bilateral;  . VITRECTOMY  bilateral    There were no vitals filed for this visit.      Subjective Assessment - 09/19/16 0929    Subjective I'm doing real well.  No pain.   Patient Stated Goals Get back to normal.   Pain Score 0-No pain                         [facility] Adult PT Treatment/Exercise - 09/19/16 0001      Knee/Hip Exercises: Aerobic   Stationary Bike 20 minutes.     Knee/Hip Exercises: Machines for Strengthening   Cybex Knee Extension 10# x 5 minutes.   Cybex Knee Flexion 40# x 5 minutes.             Balance Exercises - 09/19/16 0940      Balance Exercises: Standing   Other Standing Exercises Inverted BOSU ball in parallel bars x 8 minutes.                PT Long Term Goals - 08/27/16 1223      PT LONG TERM GOAL #1   Title Independent with a HEP.   Period Weeks   Status Achieved     PT LONG TERM GOAL #2  Title Increase left hip strength to a solid 5/5 to increase stability for functional activites.  4/5 Hip abduction   Status Not Met     PT LONG TERM GOAL #3   Title Perform a reciprocating stair gait.   Time 6   Period Weeks   Status Achieved     PT LONG TERM GOAL #4   Title Walk without deviation.   Time 6   Period Weeks   Status Partially Met     PT LONG TERM GOAL #5   Title Perform ADL's with pain not > 3/10.   Time 6   Period Weeks   Status Achieved               Plan - 09/19/16 1001    Clinical Impression Statement Patient doing great.  Flare-up of pain previously now gone.      Patient will benefit from skilled therapeutic intervention in order to improve the following deficits and impairments:  Abnormal gait, Decreased activity tolerance, Decreased strength, Pain  Visit Diagnosis: Pain in right hip - Plan: PT plan of care cert/re-cert  Muscle  weakness (generalized) - Plan: PT plan of care cert/re-cert  Pain in left hip - Plan: PT plan of care cert/re-cert     Problem List Patient Active Problem List   Diagnosis Date Noted  . Fever 05/14/2016  . S/P ORIF (open reduction internal fixation) fracture   . Closed right hip fracture, initial encounter ([place]) 05/09/2016  . Benign prostatic hyperplasia without lower urinary tract symptoms 04/18/2016  . Vitamin D deficiency 04/18/2016  . OSA (obstructive sleep apnea) 10/06/2014  . Screening examination for venereal disease 12/23/2013  . Pseudophakia of both eyes 06/23/2013  . Pain in joint, lower leg 11/19/2012  . Unspecified sinusitis (chronic) 09/16/2012  . Right groin wound 08/09/2012  . HIV disease ([place]) 06/02/2012  . Convulsions/seizures ([place]) 05/16/2012  . SIRS (systemic inflammatory response syndrome) ([place]) 04/18/2012  . Pulmonary infiltrate 04/18/2012  . Gastroparesis 04/18/2012  . Anemia, iron deficiency   . Type 1 diabetes mellitus ([place]) 05/08/2011  . Acquired hypothyroidism 05/08/2011  . Chronic kidney disease, stage 3, mod decreased GFR 12/31/2010    Cameka Rae, [name] 09/19/2016, 10:09 AM  [facility] [address] [place], [place], [address] Phone: [phone]   Fax:  [phone]  Name: Patrick Brown MRN: [id number] Date of Birth: 1970-03-25

## 2016-09-20 ENCOUNTER — Ambulatory Visit: Payer: Worker's Compensation | Admitting: Physical Therapy

## 2016-09-20 ENCOUNTER — Other Ambulatory Visit: Payer: Self-pay | Admitting: Family Medicine

## 2016-09-20 DIAGNOSIS — M25552 Pain in left hip: Secondary | ICD-10-CM

## 2016-09-20 DIAGNOSIS — IMO0002 Reserved for concepts with insufficient information to code with codable children: Secondary | ICD-10-CM

## 2016-09-20 DIAGNOSIS — N183 Chronic kidney disease, stage 3 (moderate): Principal | ICD-10-CM

## 2016-09-20 DIAGNOSIS — E1065 Type 1 diabetes mellitus with hyperglycemia: Principal | ICD-10-CM

## 2016-09-20 DIAGNOSIS — M25551 Pain in right hip: Secondary | ICD-10-CM | POA: Diagnosis not present

## 2016-09-20 DIAGNOSIS — M6281 Muscle weakness (generalized): Secondary | ICD-10-CM

## 2016-09-20 DIAGNOSIS — E1022 Type 1 diabetes mellitus with diabetic chronic kidney disease: Secondary | ICD-10-CM

## 2016-09-20 NOTE — Therapy (Signed)
[facility] [place], [place], [address] Phone: [phone]   Fax:  [phone]  Physical Therapy Treatment  Patient Details  Name: Patrick Brown MRN: [id number] Date of Birth: 1970/06/27 Referring Provider: Marchia Bond MD  Encounter Date: 09/20/2016      PT End of Session - 09/20/16 1225    Visit Number 25   Number of Visits 30   Date for PT Re-Evaluation 10/07/16   PT Start Time 1200   PT Stop Time 1244   PT Time Calculation (min) 44 min   Activity Tolerance Patient tolerated treatment well   Behavior During Therapy [facility] for tasks assessed/performed      Past Medical History:  Diagnosis Date  . Anemia, iron deficiency On procrit  . CAP (community acquired pneumonia)   . CKD (chronic kidney disease) stage 3, GFR 30-59 ml/min   . Degenerative arthritis   . Depression   . Dyslipidemia   . Gastroesophageal reflux disease   . Gastroparesis diabeticorum ([place])   . Hematuria, microscopic 10/09   work up negative (Dr. Amalia Hailey)  . HIV positive ([place])   . 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 Hypothyroidism   . IDDM (insulin dependent diabetes mellitus) ([place])    38 years  . Low HDL (under 40)   . Proteinuria   . Retinopathy    x2  . SIRS (systemic inflammatory response syndrome) ([facility])     Past Surgical History:  Procedure Laterality Date  . CATARACT EXTRACTION Right   . COLONOSCOPY    . ESOPHAGOGASTRODUODENOSCOPY    . EYE SURGERY  2006,2001   x2   . HIP ARTHROPLASTY Right 05/10/2016   Procedure: RIGHT HIP HEMIARTHROPLASTY;  Surgeon: Marchia Bond, MD;  Location: [facility];  Service: Orthopedics;  Laterality: Right;  . insulin pump    . LASIK Bilateral   . VIDEO BRONCHOSCOPY Bilateral  12/15/2012   Procedure: VIDEO BRONCHOSCOPY WITH FLUORO;  Surgeon: Kathee Delton, MD;  [facility];  Service: Cardiopulmonary;  Laterality: Bilateral;  . VITRECTOMY  bilateral    There were no vitals filed for this visit.      Subjective Assessment - 09/20/16 1226    Subjective I was tired after that last visit but doing good.   Patient Stated Goals Get back to normal.   Currently in Pain? No/denies   Pain Score 0-No pain                         [facility] Adult PT Treatment/Exercise - 09/20/16 0001      Exercises   Exercises Knee/Hip     Knee/Hip Exercises: Aerobic   Stationary Bike 20 minutes.     Knee/Hip Exercises: Machines for Strengthening   Cybex Knee Extension 10# x 5 minutes.   Cybex Knee Flexion 40# x 5 minutes.             Balance Exercises - 09/19/16 0940      Balance Exercises: Standing   Other Standing Exercises Inverted BOSU ball in parallel bars x 8 minutes.                PT Long Term Goals - 08/27/16 1223      PT LONG TERM GOAL #1   Title Independent with a HEP.  Period Weeks   Status Achieved     PT LONG TERM GOAL #2   Title Increase left hip strength to a solid 5/5 to increase stability for functional activites.  4/5 Hip abduction   Status Not Met     PT LONG TERM GOAL #3   Title Perform a reciprocating stair gait.   Time 6   Period Weeks   Status Achieved     PT LONG TERM GOAL #4   Title Walk without deviation.   Time 6   Period Weeks   Status Partially Met     PT LONG TERM GOAL #5   Title Perform ADL's with pain not > 3/10.   Time 6   Period Weeks   Status Achieved               Plan - 09/19/16 1001    Clinical Impression Statement Patient doing great.  Flare-up of pain previously now gone.      Patient will benefit from skilled therapeutic intervention in order to improve the following deficits and impairments:     Visit Diagnosis: Pain in right hip  Muscle weakness  (generalized)  Pain in left hip     Problem List Patient Active Problem List   Diagnosis Date Noted  . Fever 05/14/2016  . S/P ORIF (open reduction internal fixation) fracture   . Closed right hip fracture, initial encounter ([place]) 05/09/2016  . Benign prostatic hyperplasia without lower urinary tract symptoms 04/18/2016  . Vitamin D deficiency 04/18/2016  . OSA (obstructive sleep apnea) 10/06/2014  . Screening examination for venereal disease 12/23/2013  . Pseudophakia of both eyes 06/23/2013  . Pain in joint, lower leg 11/19/2012  . Unspecified sinusitis (chronic) 09/16/2012  . Right groin wound 08/09/2012  . HIV disease ([place]) 06/02/2012  . Convulsions/seizures ([place]) 05/16/2012  . SIRS (systemic inflammatory response syndrome) ([place]) 04/18/2012  . Pulmonary infiltrate 04/18/2012  . Gastroparesis 04/18/2012  . Anemia, iron deficiency   . Type 1 diabetes mellitus ([place]) 05/08/2011  . Acquired hypothyroidism 05/08/2011  . Chronic kidney disease, stage 3, mod decreased GFR 12/31/2010    [place], [name] 09/20/2016, 1:01 PM  [facility] [address] [place], [place], [address] Phone: [phone]   Fax:  [phone]  Name: Patrick Brown MRN: [id number] Date of Birth: 11-01-1970

## 2016-09-23 ENCOUNTER — Ambulatory Visit (INDEPENDENT_AMBULATORY_CARE_PROVIDER_SITE_OTHER): Payer: Medicare Other | Admitting: Internal Medicine

## 2016-09-23 ENCOUNTER — Encounter: Payer: Self-pay | Admitting: Internal Medicine

## 2016-09-23 VITALS — BP 148/89 | HR 77 | Temp 97.6°F | Ht 70.0 in | Wt 206.0 lb

## 2016-09-23 DIAGNOSIS — N183 Chronic kidney disease, stage 3 unspecified: Secondary | ICD-10-CM

## 2016-09-23 DIAGNOSIS — B20 Human immunodeficiency virus [HIV] disease: Secondary | ICD-10-CM

## 2016-09-23 MED ORDER — ABACAVIR-DOLUTEGRAVIR-LAMIVUD 600-50-300 MG PO TABS: 1.0000 | ORAL_TABLET | Freq: Every day | ORAL | 5 refills | Status: DC

## 2016-09-23 NOTE — Progress Notes (Signed)
CC: Follow up for HIV  Interval history: Currently is asymptomatic and well-controlled on Tivicay and Epzicom.  (His insurance copay for triumeq was high so he remains on the two pills).  Since last visit he has had no new issues.  Has no associated n/v/d.  Denies any missed doses.    Not sexually active.  Broke his hip in April and got a hip replacement.  In rehab.  Also has DM and is controlled.    ROS Constitutional: negative for fatigue and malaise Genitourinary: negative for penile discharge Integument/breast: negative for rash All other systems reviewed and are negative    Physical Exam: CONSTITUTIONAL:in no apparent distress  Vitals:   09/23/16 1454  Weight: 206 lb (93.4 kg)  Height: 5\' 10"  (1.778 m)  Blood pressure (!) 148/89, pulse 77, temperature 97.6 F (36.4 C), temperature source Oral, height 5\' 10"  (1.778 m), weight 206 lb (93.4 kg). Eyes: anicteric HENT: no thrush, no cervical lymphadenopathy Respiratory: Normal respiratory effort; CTA B  Lab Results  Component Value Date   HIV1RNAQUANT <20 NOT DETECTED 03/26/2016   HIV1RNAQUANT <20 09/18/2015   HIV1RNAQUANT <20 02/09/2015   No components found for: HIV1GENOTYPRPLUS No components found for: THELPERCELL

## 2016-09-23 NOTE — Assessment & Plan Note (Signed)
Doing well. Labs today and rtc 6 months.  Will change back to triumeq one pill with patient assistance.

## 2016-09-23 NOTE — Assessment & Plan Note (Signed)
This has been stable.  No dose adjustment indicated

## 2016-09-24 ENCOUNTER — Ambulatory Visit: Payer: Worker's Compensation | Admitting: Physical Therapy

## 2016-09-24 ENCOUNTER — Encounter: Payer: Self-pay | Admitting: Physical Therapy

## 2016-09-24 DIAGNOSIS — M25551 Pain in right hip: Secondary | ICD-10-CM | POA: Diagnosis not present

## 2016-09-24 DIAGNOSIS — M6281 Muscle weakness (generalized): Secondary | ICD-10-CM

## 2016-09-24 LAB — T-HELPER CELL (CD4) - (RCID CLINIC ONLY)
CD4 T CELL ABS: 350 /uL — AB (ref 400–2700)
CD4 T CELL HELPER: 12 % — AB (ref 33–55)

## 2016-09-24 NOTE — Therapy (Signed)
[facility] [place], [place], [address] Phone: [phone]   Fax:  [phone]  Physical Therapy Treatment  Patient Details  Name: Patrick Brown MRN: [id number] Date of Birth: 1970/08/25 Referring Provider: Marchia Bond MD  Encounter Date: 09/24/2016      PT End of Session - 09/24/16 1429    Visit Number 26   Number of Visits 30   Date for PT Re-Evaluation 10/07/16   PT Start Time 2426   PT Stop Time 1515   PT Time Calculation (min) 44 min   Activity Tolerance Patient tolerated treatment well   Behavior During Therapy [facility] for tasks assessed/performed      Past Medical History:  Diagnosis Date  . Anemia, iron deficiency On procrit  . CAP (community acquired pneumonia)   . CKD (chronic kidney disease) stage 3, GFR 30-59 ml/min   . Degenerative arthritis   . Depression   . Dyslipidemia   . Gastroesophageal reflux disease   . Gastroparesis diabeticorum ([place])   . Hematuria, microscopic 10/09   work up negative (Dr. Amalia Hailey)  . HIV positive ([place])   . 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 Hypothyroidism   . IDDM (insulin dependent diabetes mellitus) ([place])    38 years  . Low HDL (under 40)   . Proteinuria   . Retinopathy    x2  . SIRS (systemic inflammatory response syndrome) (HCC)     Past Surgical History:  Procedure Laterality Date  . CATARACT EXTRACTION Right   . COLONOSCOPY    . ESOPHAGOGASTRODUODENOSCOPY    . EYE SURGERY  2006,2001   x2   . HIP ARTHROPLASTY Right 05/10/2016   Procedure: RIGHT HIP HEMIARTHROPLASTY;  Surgeon: Marchia Bond, MD;  Location: [facility];  Service: Orthopedics;  Laterality: Right;  . insulin pump    . LASIK Bilateral   . VIDEO BRONCHOSCOPY Bilateral  12/15/2012   Procedure: VIDEO BRONCHOSCOPY WITH FLUORO;  Surgeon: Kathee Delton, MD;  [facility];  Service: Cardiopulmonary;  Laterality: Bilateral;  . VITRECTOMY  bilateral    There were no vitals filed for this visit.      Subjective Assessment - 09/24/16 1429    Subjective Reports that this is his last week of PT and then will start work conditioning. Reports that his limp today is from [place], meeting at fire department and had MD appointment yesterday which made for a very long day. Reports overall feeling 90% better and is now able to don R sock by himself and pick up objects from floor.   Patient Stated Goals Get back to normal.   Currently in Pain? Yes  No pain only tired feeling   Pain Score 3    Pain Location Hip   Pain Orientation Right   Pain Descriptors / Indicators Tiring   Pain Type Surgical pain   Pain Onset More than a month ago            [facility] PT Assessment - 09/24/16 0001      Assessment   Medical Diagnosis Left hip hemiarthroplasty.   Onset Date/Surgical Date 05/10/16   Next MD Visit 10/09/2016     Restrictions   Weight Bearing Restrictions No  [facility] PT Treatment/Exercise - 09/24/16 0001      Knee/Hip Exercises: Aerobic   Stationary Bike L2 x20 minutes     Knee/Hip Exercises: Machines for Strengthening   Cybex Knee Extension 20# 3x10 reps   Cybex Knee Flexion 40# 3x10 reps   Cybex Leg Press 2 pl, seat 7, 3x10 reps     Knee/Hip Exercises: Standing   Hip Abduction AROM;Both;3 sets;10 reps;Knee straight   Lateral Step Up 3 sets;Right;10 reps;Step Height: 6"                     PT Long Term Goals - 08/27/16 1223      PT LONG TERM GOAL #1   Title Independent with a HEP.   Period Weeks   Status Achieved     PT LONG TERM GOAL #2   Title Increase left hip strength to a solid 5/5 to increase stability for functional activites.  4/5 Hip abduction   Status Not Met     PT LONG TERM  GOAL #3   Title Perform a reciprocating stair gait.   Time 6   Period Weeks   Status Achieved     PT LONG TERM GOAL #4   Title Walk without deviation.   Time 6   Period Weeks   Status Partially Met     PT LONG TERM GOAL #5   Title Perform ADL's with pain not > 3/10.   Time 6   Period Weeks   Status Achieved               Plan - 09/24/16 1523    Clinical Impression Statement Patient tolerated today's treatment well as he had no complaints other than R hip fatigue from prolonged ambulation and standing yesterday. Patient able to complete all exercises as directed with slow and controlled pace. Hip abduction focus today to further strengthen hip abductors.   Rehab Potential Excellent   PT Frequency 2x / week   PT Duration 6 weeks   PT Treatment/Interventions ADLs/Self Care Home Management;Cryotherapy;Electrical Stimulation;Moist Heat;Stair training;Gait training;Functional mobility training;Therapeutic activities;Therapeutic exercise;Neuromuscular re-education;Manual techniques;Patient/family education   PT Next Visit Plan Continue with RLE strengthening with gait training as needed per MPT POC.      PT Home Exercise Plan HEP- SLR, standing hip abduction, bridging, sit to stands, squats to chair   Consulted and Agree with Plan of Care Patient      Patient will benefit from skilled therapeutic intervention in order to improve the following deficits and impairments:  Abnormal gait, Decreased activity tolerance, Decreased strength, Pain  Visit Diagnosis: Pain in right hip  Muscle weakness (generalized)     Problem List Patient Active Problem List   Diagnosis Date Noted  . S/P ORIF (open reduction internal fixation) fracture   . Closed right hip fracture, initial encounter ([place]) 05/09/2016  . Benign prostatic hyperplasia without lower urinary tract symptoms 04/18/2016  . Vitamin D deficiency 04/18/2016  . OSA (obstructive sleep apnea) 10/06/2014  . Screening  examination for venereal disease 12/23/2013  . Pseudophakia of both eyes 06/23/2013  . Pain in joint, lower leg 11/19/2012  . Unspecified sinusitis (chronic) 09/16/2012  . Right groin wound 08/09/2012  . HIV disease ([place]) 06/02/2012  . Convulsions/seizures ([place]) 05/16/2012  . Pulmonary infiltrate 04/18/2012  . Gastroparesis 04/18/2012  . Anemia, iron deficiency   . Type 1 diabetes mellitus ([place]) 05/08/2011  . Acquired hypothyroidism 05/08/2011  . Chronic kidney disease, stage 3, mod decreased GFR 12/31/2010  Wynelle Fanny, PTA 09/24/2016, 3:43 PM  [facility] [address] [place], [place], [address] Phone: [phone]   Fax:  [phone]  Name: Patrick Brown MRN: [id number] Date of Birth: [birth year]

## 2016-09-25 LAB — HIV-1 RNA QUANT-NO REFLEX-BLD
HIV 1 RNA QUANT: DETECTED {copies}/mL — AB
HIV-1 RNA Quant, Log: <1.3 Log copies/mL — AB

## 2016-09-26 ENCOUNTER — Ambulatory Visit: Payer: Worker's Compensation | Admitting: Physical Therapy

## 2016-09-26 DIAGNOSIS — M25551 Pain in right hip: Secondary | ICD-10-CM | POA: Diagnosis not present

## 2016-09-26 DIAGNOSIS — M6281 Muscle weakness (generalized): Secondary | ICD-10-CM

## 2016-09-26 DIAGNOSIS — M25552 Pain in left hip: Secondary | ICD-10-CM

## 2016-09-26 NOTE — Therapy (Signed)
[facility] [place], [place], [address] Phone: [phone]   Fax:  [phone]  Physical Therapy Treatment  Patient Details  Name: Patrick Brown MRN: [id number] Date of Birth: February 18, 1970 Referring Provider: Marchia Bond MD  Encounter Date: 09/26/2016      PT End of Session - 09/26/16 1450    Visit Number 27   Number of Visits 30   Date for PT Re-Evaluation 10/07/16   PT Start Time 0224      Past Medical History:  Diagnosis Date  . Anemia, iron deficiency On procrit  . CAP (community acquired pneumonia)   . CKD (chronic kidney disease) stage 3, GFR 30-59 ml/min   . Degenerative arthritis   . Depression   . Dyslipidemia   . Gastroesophageal reflux disease   . Gastroparesis diabeticorum ([place])   . Hematuria, microscopic 10/09   work up negative (Dr. Amalia Hailey)  . HIV positive ([place])   . 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 Hypothyroidism   . IDDM (insulin dependent diabetes mellitus) ([place])    38 years  . Low HDL (under 40)   . Proteinuria   . Retinopathy    x2  . SIRS (systemic inflammatory response syndrome) (HCC)     Past Surgical History:  Procedure Laterality Date  . CATARACT EXTRACTION Right   . COLONOSCOPY    . ESOPHAGOGASTRODUODENOSCOPY    . EYE SURGERY  2006,2001   x2   . HIP ARTHROPLASTY Right 05/10/2016   Procedure: RIGHT HIP HEMIARTHROPLASTY;  Surgeon: Marchia Bond, MD;  Location: [facility];  Service: Orthopedics;  Laterality: Right;  . insulin pump    . LASIK Bilateral   . VIDEO BRONCHOSCOPY Bilateral 12/15/2012   Procedure: VIDEO BRONCHOSCOPY WITH FLUORO;  Surgeon: Kathee Delton, MD;  [facility];  Service: Cardiopulmonary;  Laterality: Bilateral;  . VITRECTOMY   bilateral    There were no vitals filed for this visit.                                    PT Long Term Goals - 09/26/16 1504      PT LONG TERM GOAL #1   Title Independent with a HEP.   Time 6   Period Weeks   Status Achieved     PT LONG TERM GOAL #2   Title Increase left hip strength to a solid 5/5 to increase stability for functional activites.   Time 6   Period Weeks   Status Achieved     PT LONG TERM GOAL #3   Title Perform a reciprocating stair gait.   Time 6   Period Weeks   Status Achieved     PT LONG TERM GOAL #4   Title Walk without deviation.   Time 6   Period Weeks   Status Partially Met     PT LONG TERM GOAL #5   Title Perform ADL's with pain not > 3/10.   Time 6   Period Weeks   Status Achieved             Patient will benefit from skilled therapeutic intervention in order to improve the following deficits and impairments:     Visit Diagnosis: Pain in right hip  Muscle weakness (generalized)  Pain in left hip     Problem List Patient Active Problem List   Diagnosis Date Noted  . S/P ORIF (open reduction internal fixation) fracture   . Closed right hip fracture, initial encounter ([place]) 05/09/2016  . Benign prostatic hyperplasia without lower urinary tract symptoms 04/18/2016  . Vitamin D deficiency 04/18/2016  . OSA (obstructive sleep apnea) 10/06/2014  . Screening examination for venereal disease 12/23/2013  . Pseudophakia of both eyes 06/23/2013  . Pain in joint, lower leg 11/19/2012  . Unspecified sinusitis (chronic) 09/16/2012  . Right groin wound 08/09/2012  . HIV disease ([place]) 06/02/2012  . Convulsions/seizures ([place]) 05/16/2012  . Pulmonary infiltrate 04/18/2012  . Gastroparesis 04/18/2012  . Anemia, iron deficiency   . Type 1 diabetes mellitus ([place]) 05/08/2011  . Acquired hypothyroidism 05/08/2011  . Chronic kidney disease, stage 3, mod decreased GFR 12/31/2010   PHYSICAL THERAPY  DISCHARGE SUMMARY  Visits from Start of Care: 27.  Current functional level related to goals / functional outcomes: See above.   Remaining deficits: Patient did great with just a mild gait deviation.   Education / Equipment: HEP. Plan: Patient agrees to discharge.  Patient goals were partially met. Patient is being discharged due to meeting the stated rehab goals.  ?????      Treg Diemer, [name] 09/26/2016, 3:05 PM  [facility] [address] [place], [place], [address] Phone: [phone]   Fax:  [phone]  Name: Cardarius Senat MRN: [id number] Date of Birth: Feb 19, 1970

## 2016-10-03 DIAGNOSIS — G4733 Obstructive sleep apnea (adult) (pediatric): Secondary | ICD-10-CM | POA: Diagnosis not present

## 2016-10-03 DIAGNOSIS — R0902 Hypoxemia: Secondary | ICD-10-CM | POA: Diagnosis not present

## 2016-10-03 DIAGNOSIS — J9690 Respiratory failure, unspecified, unspecified whether with hypoxia or hypercapnia: Secondary | ICD-10-CM | POA: Diagnosis not present

## 2016-10-08 ENCOUNTER — Other Ambulatory Visit: Payer: Self-pay | Admitting: Family Medicine

## 2016-10-09 ENCOUNTER — Encounter: Payer: Self-pay | Admitting: Internal Medicine

## 2016-10-11 DIAGNOSIS — H179 Unspecified corneal scar and opacity: Secondary | ICD-10-CM | POA: Diagnosis not present

## 2016-10-11 DIAGNOSIS — Z9842 Cataract extraction status, left eye: Secondary | ICD-10-CM | POA: Diagnosis not present

## 2016-10-28 ENCOUNTER — Other Ambulatory Visit: Payer: Self-pay | Admitting: Family Medicine

## 2016-10-31 ENCOUNTER — Ambulatory Visit: Payer: Medicare Other

## 2016-11-06 ENCOUNTER — Other Ambulatory Visit: Payer: Self-pay | Admitting: *Deleted

## 2016-11-06 ENCOUNTER — Ambulatory Visit (INDEPENDENT_AMBULATORY_CARE_PROVIDER_SITE_OTHER): Payer: Medicare Other | Admitting: Physician Assistant

## 2016-11-06 ENCOUNTER — Encounter: Payer: Self-pay | Admitting: Physician Assistant

## 2016-11-06 DIAGNOSIS — J301 Allergic rhinitis due to pollen: Secondary | ICD-10-CM | POA: Insufficient documentation

## 2016-11-06 MED ORDER — FLUTICASONE PROPIONATE 50 MCG/ACT NA SUSP: 2.0000 | Freq: Every day | NASAL | 11 refills | Status: DC

## 2016-11-06 MED ORDER — METHYLPREDNISOLONE ACETATE 80 MG/ML IJ SUSP
80.0000 mg | Freq: Once | INTRAMUSCULAR | Status: AC
  Administered 2016-11-06: 80 mg via INTRAMUSCULAR

## 2016-11-06 MED ORDER — CETIRIZINE HCL 10 MG PO TABS: 10.0000 mg | ORAL_TABLET | Freq: Every day | ORAL | 11 refills | Status: DC

## 2016-11-06 MED ORDER — ABACAVIR-DOLUTEGRAVIR-LAMIVUD 600-50-300 MG PO TABS: 1.0000 | ORAL_TABLET | Freq: Every day | ORAL | 5 refills | Status: DC

## 2016-11-06 NOTE — Progress Notes (Signed)
BP (!) 144/91   Pulse 81   Temp 97.9 F (36.6 C) (Oral)   Ht 5\' 10"  (1.778 m)   Wt 206 lb 6.4 oz (93.6 kg)   BMI 29.62 kg/m    Subjective:    Patient ID: Patrick Brown, male    DOB: Aug 22, 1970, 46 y.o.   MRN: [id number]  HPI: Patrick Brown is a 46 y.o. male presenting on 11/06/2016 for Cough and Nasal Congestion This patient is having another exacerbation of his allergies. He states that it happens seasonally. He will also need to get a steroid shot to get it calmed down in the past. If he does not have this will go into a severe bronchitis or sinusitis. It is gone on for several days at this time.  Relevant past medical, surgical, family and social history reviewed and updated as indicated. Allergies and medications reviewed and updated.  Past Medical History:  Diagnosis Date  . Anemia, iron deficiency On procrit  . CAP (community acquired pneumonia)   . CKD (chronic kidney disease) stage 3, GFR 30-59 ml/min (HCC)   . Degenerative arthritis   . Depression   . Dyslipidemia   . Gastroesophageal reflux disease   . Gastroparesis diabeticorum ([place])   . Hematuria, microscopic 10/09   work up negative (Dr. Amalia Hailey)  . HIV positive ([place])   . 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 Hypothyroidism   . IDDM (insulin dependent diabetes mellitus) ([place])    38 years  . Low HDL (under 40)   . Proteinuria   . Retinopathy    x2  . SIRS (systemic inflammatory response syndrome) (HCC)     Past Surgical History:  Procedure Laterality Date  . CATARACT EXTRACTION Right   . COLONOSCOPY    . ESOPHAGOGASTRODUODENOSCOPY    . EYE SURGERY  2006,2001   x2   . HIP ARTHROPLASTY Right 05/10/2016   Procedure: RIGHT HIP HEMIARTHROPLASTY;  Surgeon: Marchia Bond, MD;  Location: [facility];  Service: Orthopedics;  Laterality: Right;  . insulin pump    . LASIK Bilateral   . VIDEO BRONCHOSCOPY Bilateral 12/15/2012   Procedure: VIDEO BRONCHOSCOPY WITH FLUORO;  Surgeon: Kathee Delton, MD;  [facility];  Service: Cardiopulmonary;  Laterality: Bilateral;  . VITRECTOMY  bilateral    Review of Systems  Constitutional: Negative.  Negative for appetite change and fatigue.  HENT: Positive for congestion, sneezing and sore throat.   Eyes: Negative.  Negative for pain and visual disturbance.  Respiratory: Negative.  Negative for cough, chest tightness, shortness of breath and wheezing.   Cardiovascular: Negative.  Negative for chest pain, palpitations and leg swelling.  Gastrointestinal: Negative.  Negative for abdominal pain, diarrhea, nausea and vomiting.  Endocrine: Negative.   Genitourinary: Negative.   Musculoskeletal: Negative.   Skin: Negative.  Negative for color change and rash.  Neurological: Negative.  Negative for weakness, numbness and headaches.  Psychiatric/Behavioral: Negative.     Allergies as of 11/06/2016      Reactions   Sulfa Antibiotics Other (See Comments)   High potassium   Ramipril Cough   Versed [midazolam] Other (See Comments)   "I don't wake up very good or clear it out of my system"      Medication List       Accurate as of 11/06/16 10:32  AM. Always use your most recent med list.          abacavir-dolutegravir-lamiVUDine 600-50-300 MG tablet Commonly known as:  TRIUMEQ Take 1 tablet by mouth daily.   acetaminophen 500 MG tablet Commonly known as:  TYLENOL Take 1,000 mg by mouth 2 (two) times daily.   acyclovir 400 MG tablet Commonly known as:  ZOVIRAX TAKE 1 TABLET (400 MG TOTAL) BY MOUTH 2 (TWO) TIMES DAILY.   AGAMATRIX ULTRA-THIN LANCETS Misc TEST BS 4 TIMES A DAY AND PRN. E10.65   ANDROGEL PUMP 20.25 MG/ACT (1.62%) Gel Generic drug:  Testosterone APPLY 2 PUMPS DAILY AS DIRECTED   aspirin 81 MG chewable  tablet Chew 81 mg by mouth every morning.   cetirizine 10 MG tablet Commonly known as:  ZYRTEC Take 1 tablet (10 mg total) by mouth daily.   cyclobenzaprine 10 MG tablet Commonly known as:  FLEXERIL Take 1 tablet (10 mg total) by mouth 3 (three) times daily as needed for muscle spasms.   DULoxetine 60 MG capsule Commonly known as:  CYMBALTA TAKE 1 CAPSULE (60 MG TOTAL) BY MOUTH 2 (TWO) TIMES DAILY. AS DIRECTED   esomeprazole 40 MG capsule Commonly known as:  NEXIUM TAKE ONE CAPSULE BY MOUTH DAILY   ferrous sulfate 325 (65 FE) MG tablet Take 650 mg by mouth daily with breakfast.   fludrocortisone 0.1 MG tablet Commonly known as:  FLORINEF Take 0.1 mg by mouth daily.   fluocinonide cream 0.05 % Commonly known as:  LIDEX Apply 1 application topically 2 (two) times daily.   fluticasone 50 MCG/ACT nasal spray Commonly known as:  FLONASE Place 2 sprays into both nostrils daily.   furosemide 40 MG tablet Commonly known as:  LASIX Take 20 mg by mouth 2 (two) times daily.   GLUCOSAMINE 1500 COMPLEX PO Take 1 tablet by mouth 3 (three) times daily.   insulin lispro 100 UNIT/ML injection Commonly known as:  HUMALOG Use 42units to 120 units PER PUMP daily  AS DIRECTED   levothyroxine 175 MCG tablet Commonly known as:  SYNTHROID, LEVOTHROID TAKE 1 TABLET (175 MCG TOTAL) BY MOUTH DAILY BEFORE BREAKFAST.   LORazepam 0.5 MG tablet Commonly known as:  ATIVAN TAKE 1 TABLET BY MOUTH EVERY DAY AS NEEDED   metoCLOPramide 5 MG tablet Commonly known as:  REGLAN TAKE 1 TABLET BY MOUTH 3 TIMES A DAY BEFORE MEALS   niacin 1000 MG CR tablet Commonly known as:  NIASPAN TAKE 1 TABLET (1,000 MG TOTAL) BY MOUTH AT BEDTIME.   omega-3 acid ethyl esters 1 g capsule Commonly known as:  LOVAZA TAKE 2 CAPSULES (2 G TOTAL) BY MOUTH 2 (TWO) TIMES DAILY.   ondansetron 4 MG tablet Commonly known as:  ZOFRAN Take 1 tablet (4 mg total) by mouth every 8 (eight) hours as needed for nausea.    ONETOUCH VERIO test strip Generic drug:  glucose blood TEST BS 4 TIMES A DAY AND PRN.   OVER THE COUNTER MEDICATION Take 1 capsule by mouth daily. [name]- probiotic daily   simvastatin 40 MG tablet Commonly known as:  ZOCOR TAKE 1 TABLET (40 MG TOTAL) BY MOUTH AT BEDTIME.   traMADol 50 MG tablet Commonly known as:  ULTRAM Take 1 tablet (50 mg total) by mouth every 12 (twelve) hours as needed. for pain   ULORIC 40 MG tablet Generic drug:  febuxostat TAKE 1 TABLET BY MOUTH EVERY DAY          Objective:    BP (!) 144/91  Pulse 81   Temp 97.9 F (36.6 C) (Oral)   Ht 5\' 10"  (1.778 m)   Wt 206 lb 6.4 oz (93.6 kg)   BMI 29.62 kg/m   Allergies  Allergen Reactions  . Sulfa Antibiotics Other (See Comments)    High potassium  . Ramipril Cough  . Versed [Midazolam] Other (See Comments)    "I don't wake up very good or clear it out of my system"    Physical Exam  Constitutional: He appears well-developed and well-nourished. No distress.  HENT:  Head: Normocephalic and atraumatic.  Right Ear: A middle ear effusion is present.  Left Ear: A middle ear effusion is present.  Nose: Mucosal edema and rhinorrhea present.  Mouth/Throat: Posterior oropharyngeal edema present.  Eyes: Pupils are equal, round, and reactive to light. Conjunctivae and EOM are normal.  Cardiovascular: Normal rate, regular rhythm and normal heart sounds.   Pulmonary/Chest: Effort normal and breath sounds normal. No respiratory distress.  Skin: Skin is warm and dry.  Psychiatric: He has a normal mood and affect. His behavior is normal.  Nursing note and vitals reviewed.       Assessment & Plan:   1. Non-seasonal allergic rhinitis due to pollen - methylPREDNISolone acetate (DEPO-MEDROL) injection 80 mg; Inject 1 mL (80 mg total) into the muscle once. - fluticasone (FLONASE) 50 MCG/ACT nasal spray; Place 2 sprays into both nostrils daily.  Dispense: 16 g; Refill: 11 - cetirizine (ZYRTEC) 10 MG  tablet; Take 1 tablet (10 mg total) by mouth daily.  Dispense: 30 tablet; Refill: 11    Current Outpatient Prescriptions:  .  abacavir-dolutegravir-lamiVUDine (TRIUMEQ) 600-50-300 MG tablet, Take 1 tablet by mouth daily., Disp: 30 tablet, Rfl: 5 .  acetaminophen (TYLENOL) 500 MG tablet, Take 1,000 mg by mouth 2 (two) times daily. , Disp: , Rfl:  .  acyclovir (ZOVIRAX) 400 MG tablet, TAKE 1 TABLET (400 MG TOTAL) BY MOUTH 2 (TWO) TIMES DAILY., Disp: , Rfl:  .  AGAMATRIX ULTRA-THIN LANCETS MISC, TEST BS 4 TIMES A DAY AND PRN. E10.65, Disp: 100 each, Rfl: 11 .  ANDROGEL PUMP 20.25 MG/ACT (1.62%) GEL, APPLY 2 PUMPS DAILY AS DIRECTED, Disp: 75 g, Rfl: 2 .  aspirin 81 MG chewable tablet, Chew 81 mg by mouth every morning., Disp: , Rfl:  .  cyclobenzaprine (FLEXERIL) 10 MG tablet, Take 1 tablet (10 mg total) by mouth 3 (three) times daily as needed for muscle spasms., Disp: 30 tablet, Rfl: 0 .  DULoxetine (CYMBALTA) 60 MG capsule, TAKE 1 CAPSULE (60 MG TOTAL) BY MOUTH 2 (TWO) TIMES DAILY. AS DIRECTED, Disp: 60 capsule, Rfl: 2 .  esomeprazole (NEXIUM) 40 MG capsule, TAKE ONE CAPSULE BY MOUTH DAILY, Disp: 30 capsule, Rfl: 5 .  ferrous sulfate 325 (65 FE) MG tablet, Take 650 mg by mouth daily with breakfast. , Disp: , Rfl:  .  fludrocortisone (FLORINEF) 0.1 MG tablet, Take 0.1 mg by mouth daily. , Disp: , Rfl:  .  fluocinonide cream (LIDEX) 6.06 %, Apply 1 application topically 2 (two) times daily., Disp: , Rfl:  .  fluticasone (FLONASE) 50 MCG/ACT nasal spray, Place 2 sprays into both nostrils daily., Disp: 16 g, Rfl: 11 .  furosemide (LASIX) 40 MG tablet, Take 20 mg by mouth 2 (two) times daily., Disp: , Rfl: 4 .  Glucosamine-Chondroit-Vit C-Mn (GLUCOSAMINE 1500 COMPLEX PO), Take 1 tablet by mouth 3 (three) times daily. , Disp: , Rfl:  .  insulin lispro (HUMALOG) 100 UNIT/ML injection, Use 42units to 120  units PER PUMP daily  AS DIRECTED, Disp: 60 mL, Rfl: 11 .  levothyroxine (SYNTHROID, LEVOTHROID) 175  MCG tablet, TAKE 1 TABLET (175 MCG TOTAL) BY MOUTH DAILY BEFORE BREAKFAST., Disp: 30 tablet, Rfl: 11 .  LORazepam (ATIVAN) 0.5 MG tablet, TAKE 1 TABLET BY MOUTH EVERY DAY AS NEEDED, Disp: 30 tablet, Rfl: 2 .  metoCLOPramide (REGLAN) 5 MG tablet, TAKE 1 TABLET BY MOUTH 3 TIMES A DAY BEFORE MEALS, Disp: 90 tablet, Rfl: 3 .  niacin (NIASPAN) 1000 MG CR tablet, TAKE 1 TABLET (1,000 MG TOTAL) BY MOUTH AT BEDTIME. (Patient taking differently: TAKE 1 TABLET (1,500 MG TOTAL) BY MOUTH AT BEDTIME.), Disp: 30 tablet, Rfl: 5 .  omega-3 acid ethyl esters (LOVAZA) 1 g capsule, TAKE 2 CAPSULES (2 G TOTAL) BY MOUTH 2 (TWO) TIMES DAILY., Disp: 360 capsule, Rfl: 1 .  ondansetron (ZOFRAN) 4 MG tablet, Take 1 tablet (4 mg total) by mouth every 8 (eight) hours as needed for nausea., Disp: 15 tablet, Rfl: 0 .  ONETOUCH VERIO test strip, TEST BS 4 TIMES A DAY AND PRN., Disp: 100 each, Rfl: 4 .  OVER THE COUNTER MEDICATION, Take 1 capsule by mouth daily. [place]- probiotic daily, Disp: , Rfl:  .  simvastatin (ZOCOR) 40 MG tablet, TAKE 1 TABLET (40 MG TOTAL) BY MOUTH AT BEDTIME., Disp: 90 tablet, Rfl: 1 .  traMADol (ULTRAM) 50 MG tablet, Take 1 tablet (50 mg total) by mouth every 12 (twelve) hours as needed. for pain, Disp: 60 tablet, Rfl: 0 .  ULORIC 40 MG tablet, TAKE 1 TABLET BY MOUTH EVERY DAY, Disp: 90 tablet, Rfl: 0 .  cetirizine (ZYRTEC) 10 MG tablet, Take 1 tablet (10 mg total) by mouth daily., Disp: 30 tablet, Rfl: 11 Continue all other maintenance medications as listed above.  Follow up plan: Return if symptoms worsen or fail to improve.  Educational handout given for [place] PA-C [facility] [address]  [place], [place] [address] [phone]   11/06/2016, 10:32 AM

## 2016-11-06 NOTE — Patient Instructions (Signed)
In a few days you may receive a survey in the mail or online from [employer] regarding your visit with us today. Please take a moment to fill this out. Your feedback is very important to our whole office. It can help us better understand your needs as well as improve your experience and satisfaction. Thank you for taking your time to complete it. We care about you.  Lanier Millon, PA-C  

## 2016-11-07 ENCOUNTER — Ambulatory Visit: Payer: Medicare Other

## 2016-11-13 ENCOUNTER — Ambulatory Visit (INDEPENDENT_AMBULATORY_CARE_PROVIDER_SITE_OTHER): Payer: Medicare Other

## 2016-11-13 DIAGNOSIS — Z23 Encounter for immunization: Secondary | ICD-10-CM | POA: Diagnosis not present

## 2016-11-27 ENCOUNTER — Other Ambulatory Visit: Payer: Self-pay | Admitting: Internal Medicine

## 2016-11-27 DIAGNOSIS — B20 Human immunodeficiency virus [HIV] disease: Secondary | ICD-10-CM

## 2016-12-03 ENCOUNTER — Other Ambulatory Visit: Payer: Self-pay | Admitting: *Deleted

## 2016-12-03 MED ORDER — METOCLOPRAMIDE HCL 5 MG PO TABS: ORAL_TABLET | ORAL | 1 refills | Status: DC

## 2016-12-03 MED ORDER — DULOXETINE HCL 60 MG PO CPEP: 60.0000 mg | ORAL_CAPSULE | Freq: Two times a day (BID) | ORAL | 2 refills | Status: DC

## 2016-12-06 ENCOUNTER — Other Ambulatory Visit: Payer: Self-pay

## 2016-12-06 MED ORDER — ESOMEPRAZOLE MAGNESIUM 40 MG PO CPDR: 40.0000 mg | DELAYED_RELEASE_CAPSULE | Freq: Every day | ORAL | 1 refills | Status: DC

## 2016-12-13 ENCOUNTER — Other Ambulatory Visit: Payer: Self-pay | Admitting: Family Medicine

## 2016-12-23 ENCOUNTER — Encounter: Payer: Self-pay | Admitting: Family Medicine

## 2016-12-23 ENCOUNTER — Other Ambulatory Visit: Payer: Self-pay | Admitting: Physician Assistant

## 2016-12-23 ENCOUNTER — Ambulatory Visit (INDEPENDENT_AMBULATORY_CARE_PROVIDER_SITE_OTHER): Payer: Medicare Other | Admitting: Family Medicine

## 2016-12-23 VITALS — BP 142/94 | HR 100 | Temp 98.2°F | Ht 70.0 in | Wt 206.0 lb

## 2016-12-23 DIAGNOSIS — R030 Elevated blood-pressure reading, without diagnosis of hypertension: Secondary | ICD-10-CM

## 2016-12-23 DIAGNOSIS — J029 Acute pharyngitis, unspecified: Secondary | ICD-10-CM | POA: Diagnosis not present

## 2016-12-23 DIAGNOSIS — J069 Acute upper respiratory infection, unspecified: Secondary | ICD-10-CM | POA: Diagnosis not present

## 2016-12-23 LAB — CULTURE, GROUP A STREP

## 2016-12-23 LAB — RAPID STREP SCREEN (MED CTR MEBANE ONLY): STREP GP A AG, IA W/REFLEX: NEGATIVE

## 2016-12-23 MED ORDER — MAGIC MOUTHWASH W/LIDOCAINE: 5.0000 mL | Freq: Four times a day (QID) | ORAL | 0 refills | Status: DC

## 2016-12-23 MED ORDER — AMOXICILLIN-POT CLAVULANATE 875-125 MG PO TABS: 1.0000 | ORAL_TABLET | Freq: Two times a day (BID) | ORAL | 0 refills | Status: DC

## 2016-12-23 NOTE — Progress Notes (Signed)
Subjective: CC: sore throat PCP: Chipper Herb, MD UEK:CMKLK Samons is a 46 y.o. male presenting to clinic today for:  1. Cold symptoms  Patient reports sore throat that started yesterday.  He reports that he has a chronic cough which seems slightly worse over the last week.  He describes his cough as productive with clear phlegm.  Denies hemoptysis, shortness of breath, chest pain, wheezing.  He has been taking Tylenol, last dose this morning.  He is also been using cough drops which she reports does relieve the sore throat but does not last long enough.  He also reports that he has been using doxycycline 100 mg twice daily for the last 6 days, which was prescribed initially for skin.Marland Kitchen  He reports that Augmentin typically helps when he has these types of symptoms.  Denies rhinorrhea, sinus pressure, headache, dizziness, rash, nausea, vomiting, diarrhea, fevers, chills, myalgia, sick contacts, recent travel.  Past medical history significant for HIV.  He reports compliance with antiviral medications.  Allergies  Allergen Reactions  . Sulfa Antibiotics Other (See Comments)    High potassium  . Ramipril Cough  . Versed [Midazolam] Other (See Comments)    "I don't wake up very good or clear it out of my system"   Past Medical History:  Diagnosis Date  . Anemia, iron deficiency On procrit  . CAP (community acquired pneumonia)   . CKD (chronic kidney disease) stage 3, GFR 30-59 ml/min (HCC)   . Degenerative arthritis   . Depression   . Dyslipidemia   . Gastroesophageal reflux disease   . Gastroparesis diabeticorum ([place])   . Hematuria, microscopic 10/09   work up negative (Dr. Amalia Hailey)  . HIV positive ([place])   . 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 Hypothyroidism    . IDDM (insulin dependent diabetes mellitus) ([place])    38 years  . Low HDL (under 40)   . Proteinuria   . Retinopathy    x2  . SIRS (systemic inflammatory response syndrome) (HCC)    Family History  Problem Relation Age of Onset  . Diabetes type I Brother   . Prostate cancer Other   . Dementia Other   . Dementia Other   . Dementia Other     Current Outpatient Medications:  .  abacavir-dolutegravir-lamiVUDine (TRIUMEQ) 600-50-300 MG tablet, Take 1 tablet by mouth daily., Disp: 30 tablet, Rfl: 5 .  acetaminophen (TYLENOL) 500 MG tablet, Take 1,000 mg by mouth 2 (two) times daily. , Disp: , Rfl:  .  acyclovir (ZOVIRAX) 400 MG tablet, TAKE 1 TABLET (400 MG TOTAL) BY MOUTH 2 (TWO) TIMES DAILY., Disp: , Rfl:  .  AGAMATRIX ULTRA-THIN LANCETS MISC, TEST BS 4 TIMES A DAY AND PRN. E10.65, Disp: 100 each, Rfl: 11 .  ANDROGEL PUMP 20.25 MG/ACT (1.62%) GEL, APPLY 2 PUMPS DAILY AS DIRECTED, Disp: 75 g, Rfl: 2 .  aspirin 81 MG chewable tablet, Chew 81 mg by mouth every morning., Disp: , Rfl:  .  cetirizine (ZYRTEC) 10 MG tablet, Take 1 tablet (10 mg total) by mouth daily., Disp: 30 tablet, Rfl: 11 .  cyclobenzaprine (FLEXERIL) 10 MG tablet, Take 1 tablet (10 mg total) by mouth 3 (three) times daily as needed for muscle spasms., Disp: 30 tablet, Rfl: 0 .  DULoxetine (CYMBALTA) 60 MG capsule, Take  1 capsule (60 mg total) by mouth 2 (two) times daily. As directed, Disp: 60 capsule, Rfl: 2 .  esomeprazole (NEXIUM) 40 MG capsule, Take 1 capsule (40 mg total) by mouth daily., Disp: 90 capsule, Rfl: 1 .  ferrous sulfate 325 (65 FE) MG tablet, Take 650 mg by mouth daily with breakfast. , Disp: , Rfl:  .  fludrocortisone (FLORINEF) 0.1 MG tablet, Take 0.1 mg by mouth daily. , Disp: , Rfl:  .  fluocinonide cream (LIDEX) 0.35 %, Apply 1 application topically 2 (two) times daily., Disp: , Rfl:  .  fluticasone (FLONASE) 50 MCG/ACT nasal spray, Place 2 sprays into both nostrils daily., Disp: 16 g, Rfl: 11 .   furosemide (LASIX) 40 MG tablet, Take 20 mg by mouth 2 (two) times daily., Disp: , Rfl: 4 .  Glucosamine-Chondroit-Vit C-Mn (GLUCOSAMINE 1500 COMPLEX PO), Take 1 tablet by mouth 3 (three) times daily. , Disp: , Rfl:  .  insulin lispro (HUMALOG) 100 UNIT/ML injection, Use 42units to 120 units PER PUMP daily  AS DIRECTED, Disp: 60 mL, Rfl: 11 .  levothyroxine (SYNTHROID, LEVOTHROID) 175 MCG tablet, TAKE 1 TABLET (175 MCG TOTAL) BY MOUTH DAILY BEFORE BREAKFAST., Disp: 30 tablet, Rfl: 11 .  LORazepam (ATIVAN) 0.5 MG tablet, TAKE 1 TABLET BY MOUTH EVERY DAY AS NEEDED, Disp: 30 tablet, Rfl: 2 .  metoCLOPramide (REGLAN) 5 MG tablet, TAKE 1 TABLET BY MOUTH 3 TIMES A DAY BEFORE MEALS, Disp: 90 tablet, Rfl: 1 .  niacin (NIASPAN) 1000 MG CR tablet, TAKE 1 TABLET (1,000 MG TOTAL) BY MOUTH AT BEDTIME. (Patient taking differently: TAKE 1 TABLET (1,500 MG TOTAL) BY MOUTH AT BEDTIME.), Disp: 30 tablet, Rfl: 5 .  omega-3 acid ethyl esters (LOVAZA) 1 g capsule, TAKE 2 CAPSULES (2 G TOTAL) BY MOUTH 2 (TWO) TIMES DAILY., Disp: 360 capsule, Rfl: 1 .  ondansetron (ZOFRAN) 4 MG tablet, Take 1 tablet (4 mg total) by mouth every 8 (eight) hours as needed for nausea., Disp: 15 tablet, Rfl: 0 .  ONETOUCH VERIO test strip, TEST BS 4 TIMES A DAY AND PRN., Disp: 100 each, Rfl: 4 .  OVER THE COUNTER MEDICATION, Take 1 capsule by mouth daily. Phillips- probiotic daily, Disp: , Rfl:  .  simvastatin (ZOCOR) 40 MG tablet, TAKE 1 TABLET (40 MG TOTAL) BY MOUTH AT BEDTIME., Disp: 90 tablet, Rfl: 1 .  TIVICAY 50 MG tablet, TAKE 1 TABLET BY MOUTH EVERY DAY, Disp: 30 tablet, Rfl: 6 .  traMADol (ULTRAM) 50 MG tablet, Take 1 tablet (50 mg total) by mouth every 12 (twelve) hours as needed. for pain, Disp: 60 tablet, Rfl: 0 .  ULORIC 40 MG tablet, TAKE 1 TABLET BY MOUTH EVERY DAY, Disp: 90 tablet, Rfl: 1  Social Hx: non smoker.   ROS: Per HPI  Objective: Office vital signs reviewed. BP (!) 142/94   Pulse 100   Temp 98.2 F (36.8 C)  (Oral)   Ht 5\' 10"  (1.778 m)   Wt 206 lb (93.4 kg)   BMI 29.56 kg/m   Physical Examination:  General: Awake, alert, well nourished, nontoxic, No acute distress HEENT: Normal    Neck: No masses palpated. No lymphadenopathy    Ears: Tympanic membranes intact, normal light reflex, no erythema, no bulging    Eyes: PERRLA, extraocular membranes intact, sclera white, no ocular discharge    Nose: nasal turbinates moist,  no nasal discharge    Throat: moist mucus membranes, mild oropharyngeal erythema, no tonsillar exudate and not enlarged.  Airway  is patent Cardio: regular rate and rhythm, S1S2 heard, no murmurs appreciated Pulm: clear to auscultation bilaterally, no wheezes, rhonchi or rales; normal work of breathing on room air  Assessment/ Plan: 46 y.o. male   1. URI with cough and congestion Patient is afebrile with slightly elevated blood pressure.  He is well-appearing.  Rapid strep was negative.  There is no evidence of bacterial infection on exam but patient has been taking doxycycline for the last 6 days and recently took Tylenol, which may be masking symptoms.  I did review his last CD4 count, which was 380.  He had a undetectable viral load.  I doubt atypical/opportunistic infection at this time.  I did provide him a pocket prescription for Augmentin and instructions for use were reviewed with the patient and his mother.  They voiced good understanding.  Home care instructions were reviewed with the patient. Strict return precautions and reasons for emergent evaluation in the emergency department review with patient.  They voiced understanding and will follow-up as needed  2. Sore throat - Rapid Strep Screen (Not at [facility]) - Culture, Group A Strep  3. Elevated blood pressure reading Patient follow-up in the next 2-4 weeks with his PCP for recheck.   Orders Placed This Encounter  Procedures  . Rapid Strep Screen (Not at [facility])  . Culture, Group A Strep    Order Specific Question:    Source    Answer:   throat   Meds ordered this encounter  Medications  . amoxicillin-clavulanate (AUGMENTIN) 875-125 MG tablet    Sig: Take 1 tablet 2 (two) times daily by mouth.    Dispense:  20 tablet    Refill:  [place], DO [place] [phone]

## 2016-12-23 NOTE — Patient Instructions (Addendum)
It appears that you have a viral upper respiratory infection (cold).  Cold symptoms can last up to 2 weeks.    If your symptoms do not improve in the next week or if they worsen, start taking Augmentin prescription.  - Get plenty of rest and drink plenty of fluids. - Try to breathe moist air. Use a cold mist humidifier. - Consume warm fluids (soup or tea) to provide relief for a stuffy nose and to loosen phlegm. - For nasal stuffiness, try saline nasal spray or a Neti Pot. Afrin nasal spray can also be used but this product should not be used longer than 3 days or it will cause rebound nasal stuffiness (worsening nasal congestion). - For sore throat pain relief: suck on throat lozenges, hard candy or popsicles; gargle with warm salt water (1/4 tsp. salt per 8 oz. of water); and eat soft, bland foods. - Eat a well-balanced diet. If you cannot, ensure you are getting enough nutrients by taking a daily multivitamin. - Avoid dairy products, as they can thicken phlegm. - Avoid alcohol, as it impairs your body's immune system.  CONTACT YOUR DOCTOR IF YOU EXPERIENCE ANY OF THE FOLLOWING: - High fever - Ear pain - Sinus-type headache - Unusually severe cold symptoms - Cough that gets worse while other cold symptoms improve - Flare up of any chronic lung problem, such as asthma - Your symptoms persist longer than 2 weeks

## 2016-12-25 LAB — CULTURE, GROUP A STREP: STREP A CULTURE: NEGATIVE

## 2017-01-03 ENCOUNTER — Encounter: Payer: Self-pay | Admitting: Family Medicine

## 2017-01-03 ENCOUNTER — Ambulatory Visit (INDEPENDENT_AMBULATORY_CARE_PROVIDER_SITE_OTHER): Payer: Medicare Other | Admitting: Family Medicine

## 2017-01-03 VITALS — BP 149/99 | HR 70 | Temp 97.0°F | Ht 70.0 in | Wt 209.0 lb

## 2017-01-03 DIAGNOSIS — E1065 Type 1 diabetes mellitus with hyperglycemia: Secondary | ICD-10-CM

## 2017-01-03 DIAGNOSIS — E1022 Type 1 diabetes mellitus with diabetic chronic kidney disease: Secondary | ICD-10-CM

## 2017-01-03 DIAGNOSIS — E7800 Pure hypercholesterolemia, unspecified: Secondary | ICD-10-CM

## 2017-01-03 DIAGNOSIS — E559 Vitamin D deficiency, unspecified: Secondary | ICD-10-CM | POA: Diagnosis not present

## 2017-01-03 DIAGNOSIS — IMO0002 Reserved for concepts with insufficient information to code with codable children: Secondary | ICD-10-CM

## 2017-01-03 DIAGNOSIS — E349 Endocrine disorder, unspecified: Secondary | ICD-10-CM | POA: Diagnosis not present

## 2017-01-03 DIAGNOSIS — N400 Enlarged prostate without lower urinary tract symptoms: Secondary | ICD-10-CM | POA: Diagnosis not present

## 2017-01-03 DIAGNOSIS — D509 Iron deficiency anemia, unspecified: Secondary | ICD-10-CM

## 2017-01-03 DIAGNOSIS — N183 Chronic kidney disease, stage 3 unspecified: Secondary | ICD-10-CM

## 2017-01-03 DIAGNOSIS — Z967 Presence of other bone and tendon implants: Secondary | ICD-10-CM

## 2017-01-03 DIAGNOSIS — Z8739 Personal history of other diseases of the musculoskeletal system and connective tissue: Secondary | ICD-10-CM

## 2017-01-03 DIAGNOSIS — B20 Human immunodeficiency virus [HIV] disease: Secondary | ICD-10-CM

## 2017-01-03 DIAGNOSIS — Z8781 Personal history of (healed) traumatic fracture: Secondary | ICD-10-CM

## 2017-01-03 DIAGNOSIS — Z9889 Other specified postprocedural states: Secondary | ICD-10-CM

## 2017-01-03 DIAGNOSIS — R030 Elevated blood-pressure reading, without diagnosis of hypertension: Secondary | ICD-10-CM

## 2017-01-03 LAB — URINALYSIS, COMPLETE
Bilirubin, UA: NEGATIVE
Ketones, UA: NEGATIVE
Leukocytes, UA: NEGATIVE
Nitrite, UA: NEGATIVE
PH UA: 5 (ref 5.0–7.5)
RBC, UA: NEGATIVE
Specific Gravity, UA: 1.02 (ref 1.005–1.030)
Urobilinogen, Ur: 0.2 mg/dL (ref 0.2–1.0)

## 2017-01-03 LAB — MICROSCOPIC EXAMINATION
Epithelial Cells (non renal): NONE SEEN /hpf (ref 0–10)
RENAL EPITHEL UA: NONE SEEN /HPF
WBC UA: NONE SEEN /HPF (ref 0–?)

## 2017-01-03 MED ORDER — FREESTYLE LIBRE READER DEVI: 1.0000 | Freq: Four times a day (QID) | 1 refills | Status: DC | PRN

## 2017-01-03 MED ORDER — FREESTYLE LIBRE SENSOR SYSTEM MISC: 11 refills | Status: DC

## 2017-01-03 NOTE — Patient Instructions (Addendum)
Medicare Annual Wellness Visit  [facility] and the medical providers at [facility] strive to bring you the best medical care.  In doing so we not only want to address your current medical conditions and concerns but also to detect new conditions early and prevent illness, disease and health-related problems.    Medicare offers a yearly Wellness Visit which allows our clinical staff to assess your need for preventative services including immunizations, lifestyle education, counseling to decrease risk of preventable diseases and screening for fall risk and other medical concerns.    This visit is provided free of charge (no copay) for all Medicare recipients. The clinical pharmacists at [facility] have begun to conduct these Wellness Visits which will also include a thorough review of all your medications.    As you primary medical provider recommend that you make an appointment for your Annual Wellness Visit if you have not done so already this year.  You may set up this appointment before you leave today or you may call back ([phone]) and schedule an appointment.  Please make sure when you call that you mention that you are scheduling your Annual Wellness Visit with the clinical pharmacist so that the appointment may be made for the proper length of time.     Continue current medications. Continue good therapeutic lifestyle changes which include good diet and exercise. Fall precautions discussed with patient. If an FOBT was given today- please return it to our front desk. If you are over 24 years old - you may need Prevnar 59 or the adult Pneumonia vaccine.  **Flu shots are available--- please call and schedule a FLU-CLINIC appointment**  After your visit with [place] today you will receive a survey in the mail or online from [employer] regarding your care with [place]. Please take a moment to fill this out. Your feedback is very  important to [place] as you can help [place] better understand your patient needs as well as improve your experience and satisfaction. WE CARE ABOUT YOU!!!   Continue to follow diet closely and monitor blood sugars regularly Continue to follow-up with nephrologist infectious disease and ophthalmology Sure that we get reports from the specialist when you have been seen by them.

## 2017-01-03 NOTE — Progress Notes (Signed)
Subjective:    Patient ID: Patrick Brown, male    DOB: 02-25-1970, 46 y.o.   MRN: [id number]  HPI Pt here for follow up and management of chronic medical problems which includes hyperlipidemia, diabetes, and CKD. He is taking medication regularly.  The patient is doing well overall.  He has no complaints today.  He does not need any refills today.  He does need a rectal exam today because of testosterone deficiency and testosterone replacement.  He will be given an FOBT to return and he will return to the office when he is fasting to get his blood work.  We will also get a urinalysis today.  His blood pressure is somewhat elevated today at 148/93.  He is an insulin-dependent diabetic and has positive retinopathy and is followed regularly for his eye issues by the ophthalmologist.  Patient is pleasant and alert.  He denies any chest pain or shortness of breath.  He had a recent respiratory infection but is improved from that.  He denies any trouble with nausea vomiting diarrhea blood in the stool or black tarry bowel movements.  He is passing his water well and without problems.  He keeps up with his nephrologist Dr. Antionette Fairy and sees him every 6 months.  He follows with Dr. Linus Salmons for his HIV about every 6 months.  He sees the ophthalmologist every 9 months.  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  His blood sugars at home fasting have been running in the 70 range and 2 hours after eating around 140-145.  He does check his blood sugars 4 times daily and more often when he was sick and we will try to get him a freestyle libre so he can monitor himself more easily without sticking himself so often.      Patient Active Problem List   Diagnosis Date Noted  . Non-seasonal allergic rhinitis due to pollen 11/06/2016  . S/P ORIF (open reduction internal fixation) fracture   . Closed right hip fracture, initial  encounter ([place]) 05/09/2016  . Benign prostatic hyperplasia without lower urinary tract symptoms 04/18/2016  . Vitamin D deficiency 04/18/2016  . OSA (obstructive sleep apnea) 10/06/2014  . Screening examination for venereal disease 12/23/2013  . Pseudophakia of both eyes 06/23/2013  . Pain in joint, lower leg 11/19/2012  . Unspecified sinusitis (chronic) 09/16/2012  . Right groin wound 08/09/2012  . HIV disease ([place]) 06/02/2012  . Convulsions/seizures ([place]) 05/16/2012  . Pulmonary infiltrate 04/18/2012  . Gastroparesis 04/18/2012  . Anemia, iron deficiency   . Type 1 diabetes mellitus ([place]) 05/08/2011  . Acquired hypothyroidism 05/08/2011  . Chronic kidney disease, stage 3, mod decreased GFR (H[facility]) 12/31/2010   Outpatient Encounter Medications as of 01/03/2017  Medication Sig  . abacavir-dolutegravir-lamiVUDine (TRIUMEQ) 600-50-300 MG tablet Take 1 tablet by mouth daily.  Marland Kitchen acetaminophen (TYLENOL) 500 MG tablet Take 1,000 mg by mouth 2 (two) times daily.   Marland Kitchen acyclovir (ZOVIRAX) 400 MG tablet TAKE 1 TABLET (400 MG TOTAL) BY MOUTH 2 (TWO) TIMES DAILY.  Marland Kitchen AGAMATRIX ULTRA-THIN LANCETS MISC TEST BS 4 TIMES A DAY AND PRN. E10.65  . ANDROGEL PUMP 20.25 MG/ACT (1.62%) GEL APPLY 2 PUMPS DAILY AS DIRECTED  . aspirin 81 MG chewable tablet Chew 81 mg by mouth every morning.  . cetirizine (ZYRTEC) 10 MG tablet Take 1 tablet (10 mg total) by mouth daily.  Marland Kitchen  cyclobenzaprine (FLEXERIL) 10 MG tablet Take 1 tablet (10 mg total) by mouth 3 (three) times daily as needed for muscle spasms.  . DULoxetine (CYMBALTA) 60 MG capsule Take 1 capsule (60 mg total) by mouth 2 (two) times daily. As directed  . esomeprazole (NEXIUM) 40 MG capsule Take 1 capsule (40 mg total) by mouth daily.  . ferrous sulfate 325 (65 FE) MG tablet Take 650 mg by mouth daily with breakfast.   . fludrocortisone (FLORINEF) 0.1 MG tablet Take 0.1 mg by mouth daily.   . fluocinonide cream (LIDEX) 5.00 % Apply 1 application topically 2  (two) times daily.  . fluticasone (FLONASE) 50 MCG/ACT nasal spray Place 2 sprays into both nostrils daily.  . furosemide (LASIX) 40 MG tablet Take 20 mg by mouth 2 (two) times daily.  . Glucosamine-Chondroit-Vit C-Mn (GLUCOSAMINE 1500 COMPLEX PO) Take 1 tablet by mouth 3 (three) times daily.   . insulin lispro (HUMALOG) 100 UNIT/ML injection Use 42units to 120 units PER PUMP daily  AS DIRECTED  . levothyroxine (SYNTHROID, LEVOTHROID) 175 MCG tablet TAKE 1 TABLET (175 MCG TOTAL) BY MOUTH DAILY BEFORE BREAKFAST.  Marland Kitchen LORazepam (ATIVAN) 0.5 MG tablet TAKE 1 TABLET BY MOUTH EVERY DAY AS NEEDED  . magic mouthwash w/lidocaine SOLN Take 5 mLs 4 (four) times daily by mouth.  . metoCLOPramide (REGLAN) 5 MG tablet TAKE 1 TABLET BY MOUTH 3 TIMES A DAY BEFORE MEALS  . niacin (NIASPAN) 1000 MG CR tablet TAKE 1 TABLET (1,000 MG TOTAL) BY MOUTH AT BEDTIME. (Patient taking differently: TAKE 1 TABLET (1,500 MG TOTAL) BY MOUTH AT BEDTIME.)  . omega-3 acid ethyl esters (LOVAZA) 1 g capsule TAKE 2 CAPSULES (2 G TOTAL) BY MOUTH 2 (TWO) TIMES DAILY.  Marland Kitchen ondansetron (ZOFRAN) 4 MG tablet Take 1 tablet (4 mg total) by mouth every 8 (eight) hours as needed for nausea.  Glory Rosebush VERIO test strip TEST BS 4 TIMES A DAY AND PRN.  . OVER THE COUNTER MEDICATION Take 1 capsule by mouth daily. [name]- probiotic daily  . simvastatin (ZOCOR) 40 MG tablet TAKE 1 TABLET (40 MG TOTAL) BY MOUTH AT BEDTIME.  . traMADol (ULTRAM) 50 MG tablet Take 1 tablet (50 mg total) by mouth every 12 (twelve) hours as needed. for pain  . ULORIC 40 MG tablet TAKE 1 TABLET BY MOUTH EVERY DAY  . [DISCONTINUED] amoxicillin-clavulanate (AUGMENTIN) 875-125 MG tablet Take 1 tablet 2 (two) times daily by mouth.  . [DISCONTINUED] TIVICAY 50 MG tablet TAKE 1 TABLET BY MOUTH EVERY DAY   No facility-administered encounter medications on file as of 01/03/2017.       Review of Systems  Constitutional: Negative.   HENT: Negative.   Eyes: Negative.     Respiratory: Negative.   Cardiovascular: Negative.   Gastrointestinal: Negative.   Endocrine: Negative.   Genitourinary: Negative.   Musculoskeletal: Negative.   Skin: Negative.   Allergic/Immunologic: Negative.   Neurological: Negative.   Hematological: Negative.   Psychiatric/Behavioral: Negative.        Objective:   Physical Exam  Constitutional: He is oriented to person, place, and time. He appears well-developed and well-nourished. No distress.  Patient is smiling pleasant and alert and looks good.  HENT:  Head: Normocephalic and atraumatic.  Right Ear: External ear normal.  Left Ear: External ear normal.  Mouth/Throat: Oropharynx is clear and moist. No oropharyngeal exudate.  Nasal pallor  Eyes: Conjunctivae and EOM are normal. Pupils are equal, round, and reactive to light. Right eye exhibits no discharge.  Left eye exhibits no discharge. No scleral icterus.  Neck: Normal range of motion. Neck supple. No thyromegaly present.  No bruits thyromegaly or anterior cervical adenopathy  Cardiovascular: Normal rate, regular rhythm, normal heart sounds and intact distal pulses.  No murmur heard. Heart is regular at 72/min  Pulmonary/Chest: Effort normal and breath sounds normal. No respiratory distress. He has no wheezes. He has no rales. He exhibits no tenderness.  Clear anteriorly and posteriorly and no axillary adenopathy  Abdominal: Soft. Bowel sounds are normal. He exhibits no mass. There is no tenderness. There is no rebound and no guarding.  No abdominal tenderness masses organ enlargement bruits or inguinal adenopathy  Genitourinary: Rectum normal and penis normal.  Genitourinary Comments: The prostate is minimally enlarged.  There are no lumps or masses.  There were no rectal masses.  External genitalia were within normal limits and no inguinal hernias were palpable.  Musculoskeletal: Normal range of motion. He exhibits no edema.  Lymphadenopathy:    He has no cervical  adenopathy.  Neurological: He is alert and oriented to person, place, and time. He has normal reflexes. No cranial nerve deficit.  Skin: Skin is warm and dry. No rash noted.  Psychiatric: He has a normal mood and affect. His behavior is normal. Judgment and thought content normal.  Nursing note and vitals reviewed.  BP (!) 148/93 (BP Location: Right Arm)   Pulse 70   Temp (!) 97 F (36.1 C) (Oral)   Ht '5\' 10"'$  (1.778 m)   Wt 209 lb (94.8 kg)   BMI 29.99 kg/m         Assessment & Plan:  1. Pure hypercholesterolemia -Continue current treatment pending results of lab work which is simvastatin. - Lipid panel; Future - Hepatic function panel; Future  2. Vitamin D deficiency -Vitamin D replacement per nephrologist recommendations - VITAMIN D 25 Hydroxy (Vit-D Deficiency, Fractures); Future  3. Chronic kidney disease, stage 3, mod decreased GFR (HCC) -Continue follow-up with nephrology - BMP8+EGFR; Future  4. Uncontrolled type 1 diabetes mellitus with stage 3 chronic kidney disease ([place]) -Continue aggressive management and current treatment.  We will try to get a freestyle libre to make it more easily to check her blood sugars since you check them so frequently and you have to check them more often when you are sick. - Bayer DCA Hb A1c Waived; Future  5. Iron deficiency anemia, unspecified iron deficiency anemia type -Continue with iron replacement pending results of lab work - CBC with Differential/Platelet; Future  6. Benign prostatic hyperplasia without lower urinary tract symptoms -Prostate is slightly enlarged but the patient is having no symptoms with this. - Testosterone,Free and Total; Future - PSA, total and free; Future - Urinalysis, Complete  7. Testosterone deficiency -Continue current treatment pending results of lab work - [employer] and Total; Future - PSA, total and free; Future - Urinalysis, Complete  8. HIV disease ([place]) -Follow-up with  infectious disease as planned  9. S/P ORIF (open reduction internal fixation) fracture -Follow-up with orthopedics  10. Elevated blood pressure reading -Blood pressure was elevated on 2 occasions today.  The patient is to return to the office for fasting blood work and we would ask that we get another blood pressure at that time.  He should watch his sodium intake closely. -If blood pressure remains elevated, we will need to talk to his nephrologist to see which medicine they would prefer to put him on.  Patient Instructions  Medicare Annual Wellness Visit  [facility] and the medical providers at [facility] strive to bring you the best medical care.  In doing so we not only want to address your current medical conditions and concerns but also to detect new conditions early and prevent illness, disease and health-related problems.    Medicare offers a yearly Wellness Visit which allows our clinical staff to assess your need for preventative services including immunizations, lifestyle education, counseling to decrease risk of preventable diseases and screening for fall risk and other medical concerns.    This visit is provided free of charge (no copay) for all Medicare recipients. The clinical pharmacists at [facility] have begun to conduct these Wellness Visits which will also include a thorough review of all your medications.    As you primary medical provider recommend that you make an appointment for your Annual Wellness Visit if you have not done so already this year.  You may set up this appointment before you leave today or you may call back ([phone]) and schedule an appointment.  Please make sure when you call that you mention that you are scheduling your Annual Wellness Visit with the clinical pharmacist so that the appointment may be made for the proper length of time.     Continue current medications. Continue good  therapeutic lifestyle changes which include good diet and exercise. Fall precautions discussed with patient. If an FOBT was given today- please return it to our front desk. If you are over 58 years old - you may need Prevnar 19 or the adult Pneumonia vaccine.  **Flu shots are available--- please call and schedule a FLU-CLINIC appointment**  After your visit with [place] today you will receive a survey in the mail or online from [employer] regarding your care with [place]. Please take a moment to fill this out. Your feedback is very important to [place] as you can help [place] better understand your patient needs as well as improve your experience and satisfaction. WE CARE ABOUT YOU!!!   Continue to follow diet closely and monitor blood sugars regularly Continue to follow-up with nephrologist infectious disease and ophthalmology Sure that we get reports from the specialist when you have been seen by them.   Arrie Senate MD .

## 2017-01-07 ENCOUNTER — Ambulatory Visit: Payer: Medicare Other | Admitting: *Deleted

## 2017-01-09 ENCOUNTER — Encounter: Payer: Self-pay | Admitting: Physician Assistant

## 2017-01-09 ENCOUNTER — Ambulatory Visit (INDEPENDENT_AMBULATORY_CARE_PROVIDER_SITE_OTHER): Payer: Medicare Other | Admitting: Physician Assistant

## 2017-01-09 VITALS — BP 152/87 | HR 83 | Temp 97.7°F | Ht 70.0 in | Wt 214.0 lb

## 2017-01-09 DIAGNOSIS — D509 Iron deficiency anemia, unspecified: Secondary | ICD-10-CM

## 2017-01-09 DIAGNOSIS — Z8739 Personal history of other diseases of the musculoskeletal system and connective tissue: Secondary | ICD-10-CM

## 2017-01-09 DIAGNOSIS — G4733 Obstructive sleep apnea (adult) (pediatric): Secondary | ICD-10-CM | POA: Diagnosis not present

## 2017-01-09 DIAGNOSIS — E7800 Pure hypercholesterolemia, unspecified: Secondary | ICD-10-CM

## 2017-01-09 DIAGNOSIS — N183 Chronic kidney disease, stage 3 unspecified: Secondary | ICD-10-CM

## 2017-01-09 DIAGNOSIS — N400 Enlarged prostate without lower urinary tract symptoms: Secondary | ICD-10-CM

## 2017-01-09 DIAGNOSIS — R0902 Hypoxemia: Secondary | ICD-10-CM | POA: Diagnosis not present

## 2017-01-09 DIAGNOSIS — E559 Vitamin D deficiency, unspecified: Secondary | ICD-10-CM | POA: Diagnosis not present

## 2017-01-09 DIAGNOSIS — E1022 Type 1 diabetes mellitus with diabetic chronic kidney disease: Secondary | ICD-10-CM | POA: Diagnosis not present

## 2017-01-09 DIAGNOSIS — J9690 Respiratory failure, unspecified, unspecified whether with hypoxia or hypercapnia: Secondary | ICD-10-CM | POA: Diagnosis not present

## 2017-01-09 DIAGNOSIS — E1065 Type 1 diabetes mellitus with hyperglycemia: Secondary | ICD-10-CM | POA: Diagnosis not present

## 2017-01-09 DIAGNOSIS — E349 Endocrine disorder, unspecified: Secondary | ICD-10-CM

## 2017-01-09 DIAGNOSIS — B356 Tinea cruris: Secondary | ICD-10-CM

## 2017-01-09 DIAGNOSIS — IMO0002 Reserved for concepts with insufficient information to code with codable children: Secondary | ICD-10-CM

## 2017-01-09 LAB — BAYER DCA HB A1C WAIVED: HB A1C: 7.5 % — AB (ref <7.0)

## 2017-01-09 MED ORDER — NYSTATIN 100000 UNIT/GM EX CREA: 1.0000 "application " | TOPICAL_CREAM | Freq: Two times a day (BID) | CUTANEOUS | 2 refills | Status: DC

## 2017-01-09 MED ORDER — NYSTATIN 100000 UNIT/GM EX POWD: Freq: Four times a day (QID) | CUTANEOUS | 0 refills | Status: DC

## 2017-01-09 MED ORDER — FLUCONAZOLE 150 MG PO TABS: ORAL_TABLET | ORAL | 0 refills | Status: DC

## 2017-01-09 NOTE — Patient Instructions (Signed)
In a few days you may receive a survey in the mail or online from [employer] regarding your visit with us today. Please take a moment to fill this out. Your feedback is very important to our whole office. It can help us better understand your needs as well as improve your experience and satisfaction. Thank you for taking your time to complete it. We care about you.  Sayre Mazor, PA-C  

## 2017-01-09 NOTE — Progress Notes (Signed)
BP (!) 152/87   Pulse 83   Temp 97.7 F (36.5 C) (Oral)   Ht 5\' 10"  (1.778 m)   Wt 214 lb (97.1 kg)   BMI 30.71 kg/m    Subjective:    Patient ID: Patrick Brown, male    DOB: October 02, 1970, 46 y.o.   MRN: [id number]  HPI: Sekou Zuckerman is a 46 y.o. male presenting on 01/09/2017 for yeast in groin  Patient came in today for an increase in redness in his right and left groin.  He has had a history of tinea cruris in the past.  His nystatin was expired so he did not want to use it.  In the past today gotten very irritated to the point that nothing could touch it.  At this point it is just barely getting started.  Relevant past medical, surgical, family and social history reviewed and updated as indicated. Allergies and medications reviewed and updated.  Past Medical History:  Diagnosis Date  . Anemia, iron deficiency On procrit  . CAP (community acquired pneumonia)   . CKD (chronic kidney disease) stage 3, GFR 30-59 ml/min (HCC)   . Degenerative arthritis   . Depression   . Dyslipidemia   . Gastroesophageal reflux disease   . Gastroparesis diabeticorum ([place])   . Hematuria, microscopic 10/09   work up negative (Dr. Amalia Hailey)  . HIV positive ([place])   . 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 Hypothyroidism   . IDDM (insulin dependent diabetes mellitus) ([place])    38 years  . Low HDL (under 40)   . Proteinuria   . Retinopathy    x2  . SIRS (systemic inflammatory response syndrome) (HCC)     Past Surgical History:  Procedure Laterality Date  . CATARACT EXTRACTION Right   . COLONOSCOPY    . ESOPHAGOGASTRODUODENOSCOPY    . EYE SURGERY  2006,2001   x2   . HIP ARTHROPLASTY Right 05/10/2016   Procedure: RIGHT HIP HEMIARTHROPLASTY;  Surgeon: Marchia Bond, MD;   Location: [facility];  Service: Orthopedics;  Laterality: Right;  . insulin pump    . LASIK Bilateral   . VIDEO BRONCHOSCOPY Bilateral 12/15/2012   Procedure: VIDEO BRONCHOSCOPY WITH FLUORO;  Surgeon: Kathee Delton, MD;  Location: [facility];  Service: Cardiopulmonary;  Laterality: Bilateral;  . VITRECTOMY  bilateral    Review of Systems  Constitutional: Negative.  Negative for appetite change and fatigue.  Eyes: Negative for pain and visual disturbance.  Respiratory: Negative.  Negative for cough, chest tightness, shortness of breath and wheezing.   Cardiovascular: Negative.  Negative for chest pain, palpitations and leg swelling.  Gastrointestinal: Negative.  Negative for abdominal pain, diarrhea, nausea and vomiting.  Genitourinary: Negative.   Skin: Positive for rash. Negative for color change.  Neurological: Negative.  Negative for weakness, numbness and headaches.  Psychiatric/Behavioral: Negative.     Allergies as of 01/09/2017      Reactions   Sulfa Antibiotics Other (See Comments)   High potassium   Ramipril Cough   Versed [midazolam] Other (See Comments)   "I don't wake up very good or clear it out of my system"      Medication List        Accurate as of 01/09/17  1:29 PM. Always use your most recent med list.  abacavir-dolutegravir-lamiVUDine 600-50-300 MG tablet Commonly known as:  TRIUMEQ Take 1 tablet by mouth daily.   acetaminophen 500 MG tablet Commonly known as:  TYLENOL Take 1,000 mg by mouth 2 (two) times daily.   acyclovir 400 MG tablet Commonly known as:  ZOVIRAX TAKE 1 TABLET (400 MG TOTAL) BY MOUTH 2 (TWO) TIMES DAILY.   AGAMATRIX ULTRA-THIN LANCETS Misc TEST BS 4 TIMES A DAY AND PRN. E10.65   ANDROGEL PUMP 20.25 MG/ACT (1.62%) Gel Generic drug:  Testosterone APPLY 2 PUMPS DAILY AS DIRECTED   aspirin 81 MG chewable tablet Chew 81 mg by mouth every morning.   cetirizine 10 MG tablet Commonly known as:  ZYRTEC Take 1 tablet (10 mg  total) by mouth daily.   cyclobenzaprine 10 MG tablet Commonly known as:  FLEXERIL Take 1 tablet (10 mg total) by mouth 3 (three) times daily as needed for muscle spasms.   DULoxetine 60 MG capsule Commonly known as:  CYMBALTA Take 1 capsule (60 mg total) by mouth 2 (two) times daily. As directed   esomeprazole 40 MG capsule Commonly known as:  NEXIUM Take 1 capsule (40 mg total) by mouth daily.   ferrous sulfate 325 (65 FE) MG tablet Take 650 mg by mouth daily with breakfast.   fluconazole 150 MG tablet Commonly known as:  DIFLUCAN 1 po q week x 4 weeks   fludrocortisone 0.1 MG tablet Commonly known as:  FLORINEF Take 0.1 mg by mouth daily.   fluocinonide cream 0.05 % Commonly known as:  LIDEX Apply 1 application topically 2 (two) times daily.   fluticasone 50 MCG/ACT nasal spray Commonly known as:  FLONASE Place 2 sprays into both nostrils daily.   FREESTYLE LIBRE READER Devi 1 applicator by Does not apply route 4 (four) times daily as needed.   FREESTYLE LIBRE SENSOR SYSTEM Misc Check BS QID and PRN.   furosemide 40 MG tablet Commonly known as:  LASIX Take 20 mg by mouth 2 (two) times daily.   GLUCOSAMINE 1500 COMPLEX PO Take 1 tablet by mouth 3 (three) times daily.   insulin lispro 100 UNIT/ML injection Commonly known as:  HUMALOG Use 42units to 120 units PER PUMP daily  AS DIRECTED   levothyroxine 175 MCG tablet Commonly known as:  SYNTHROID, LEVOTHROID TAKE 1 TABLET (175 MCG TOTAL) BY MOUTH DAILY BEFORE BREAKFAST.   LORazepam 0.5 MG tablet Commonly known as:  ATIVAN TAKE 1 TABLET BY MOUTH EVERY DAY AS NEEDED   magic mouthwash w/lidocaine Soln Take 5 mLs 4 (four) times daily by mouth.   metoCLOPramide 5 MG tablet Commonly known as:  REGLAN TAKE 1 TABLET BY MOUTH 3 TIMES A DAY BEFORE MEALS   niacin 1000 MG CR tablet Commonly known as:  NIASPAN TAKE 1 TABLET (1,000 MG TOTAL) BY MOUTH AT BEDTIME.   nystatin powder Commonly known as:   nystatin Apply topically 4 (four) times daily.   nystatin cream Commonly known as:  MYCOSTATIN Apply 1 application topically 2 (two) times daily.   omega-3 acid ethyl esters 1 g capsule Commonly known as:  LOVAZA TAKE 2 CAPSULES (2 G TOTAL) BY MOUTH 2 (TWO) TIMES DAILY.   ondansetron 4 MG tablet Commonly known as:  ZOFRAN Take 1 tablet (4 mg total) by mouth every 8 (eight) hours as needed for nausea.   ONETOUCH VERIO test strip Generic drug:  glucose blood TEST BS 4 TIMES A DAY AND PRN.   OVER THE COUNTER MEDICATION Take 1 capsule by mouth daily. [name]- probiotic daily  simvastatin 40 MG tablet Commonly known as:  ZOCOR TAKE 1 TABLET (40 MG TOTAL) BY MOUTH AT BEDTIME.   traMADol 50 MG tablet Commonly known as:  ULTRAM Take 1 tablet (50 mg total) by mouth every 12 (twelve) hours as needed. for pain   ULORIC 40 MG tablet Generic drug:  febuxostat TAKE 1 TABLET BY MOUTH EVERY DAY          Objective:    BP (!) 152/87   Pulse 83   Temp 97.7 F (36.5 C) (Oral)   Ht 5\' 10"  (1.778 m)   Wt 214 lb (97.1 kg)   BMI 30.71 kg/m   Allergies  Allergen Reactions  . Sulfa Antibiotics Other (See Comments)    High potassium  . Ramipril Cough  . Versed [Midazolam] Other (See Comments)    "I don't wake up very good or clear it out of my system"    Physical Exam  Constitutional: He appears well-developed and well-nourished. No distress.  HENT:  Head: Normocephalic and atraumatic.  Eyes: Conjunctivae and EOM are normal. Pupils are equal, round, and reactive to light.  Cardiovascular: Normal rate, regular rhythm and normal heart sounds.  Pulmonary/Chest: Effort normal and breath sounds normal. No respiratory distress.  Skin: Skin is warm and dry. Rash noted. Rash is macular. There is erythema.     Psychiatric: He has a normal mood and affect. His behavior is normal.  Nursing note and vitals reviewed.   Results for orders placed or performed in visit on 01/09/17   Bayer DCA Hb A1c Waived  Result Value Ref Range   Bayer DCA Hb A1c Waived 7.5 (H) <7.0 %      Assessment & Plan:     Tinea cruris - nystatin (NYSTATIN) powder; Apply topically 4 (four) times daily.  Dispense: 60 g; Refill: 0 - nystatin cream (MYCOSTATIN); Apply 1 application topically 2 (two) times daily.  Dispense: 30 g; Refill: 2 - fluconazole (DIFLUCAN) 150 MG tablet; 1 po q week x 4 weeks  Dispense: 4 tablet; Refill: 0    Current Outpatient Medications:  .  abacavir-dolutegravir-lamiVUDine (TRIUMEQ) 600-50-300 MG tablet, Take 1 tablet by mouth daily., Disp: 30 tablet, Rfl: 5 .  acetaminophen (TYLENOL) 500 MG tablet, Take 1,000 mg by mouth 2 (two) times daily. , Disp: , Rfl:  .  acyclovir (ZOVIRAX) 400 MG tablet, TAKE 1 TABLET (400 MG TOTAL) BY MOUTH 2 (TWO) TIMES DAILY., Disp: , Rfl:  .  AGAMATRIX ULTRA-THIN LANCETS MISC, TEST BS 4 TIMES A DAY AND PRN. E10.65, Disp: 100 each, Rfl: 11 .  ANDROGEL PUMP 20.25 MG/ACT (1.62%) GEL, APPLY 2 PUMPS DAILY AS DIRECTED, Disp: 75 g, Rfl: 2 .  aspirin 81 MG chewable tablet, Chew 81 mg by mouth every morning., Disp: , Rfl:  .  cetirizine (ZYRTEC) 10 MG tablet, Take 1 tablet (10 mg total) by mouth daily., Disp: 30 tablet, Rfl: 11 .  Continuous Blood Gluc Receiver (FREESTYLE LIBRE READER) DEVI, 1 applicator by Does not apply route 4 (four) times daily as needed., Disp: 1 Device, Rfl: 1 .  Continuous Blood Gluc Sensor (FREESTYLE LIBRE SENSOR SYSTEM) MISC, Check BS QID and PRN., Disp: 3 each, Rfl: 11 .  cyclobenzaprine (FLEXERIL) 10 MG tablet, Take 1 tablet (10 mg total) by mouth 3 (three) times daily as needed for muscle spasms., Disp: 30 tablet, Rfl: 0 .  DULoxetine (CYMBALTA) 60 MG capsule, Take 1 capsule (60 mg total) by mouth 2 (two) times daily. As directed, Disp: 60 capsule,  Rfl: 2 .  esomeprazole (NEXIUM) 40 MG capsule, Take 1 capsule (40 mg total) by mouth daily., Disp: 90 capsule, Rfl: 1 .  ferrous sulfate 325 (65 FE) MG tablet, Take 650 mg  by mouth daily with breakfast. , Disp: , Rfl:  .  fludrocortisone (FLORINEF) 0.1 MG tablet, Take 0.1 mg by mouth daily. , Disp: , Rfl:  .  fluocinonide cream (LIDEX) 0.09 %, Apply 1 application topically 2 (two) times daily., Disp: , Rfl:  .  fluticasone (FLONASE) 50 MCG/ACT nasal spray, Place 2 sprays into both nostrils daily., Disp: 16 g, Rfl: 11 .  furosemide (LASIX) 40 MG tablet, Take 20 mg by mouth 2 (two) times daily., Disp: , Rfl: 4 .  Glucosamine-Chondroit-Vit C-Mn (GLUCOSAMINE 1500 COMPLEX PO), Take 1 tablet by mouth 3 (three) times daily. , Disp: , Rfl:  .  insulin lispro (HUMALOG) 100 UNIT/ML injection, Use 42units to 120 units PER PUMP daily  AS DIRECTED, Disp: 60 mL, Rfl: 11 .  levothyroxine (SYNTHROID, LEVOTHROID) 175 MCG tablet, TAKE 1 TABLET (175 MCG TOTAL) BY MOUTH DAILY BEFORE BREAKFAST., Disp: 30 tablet, Rfl: 11 .  LORazepam (ATIVAN) 0.5 MG tablet, TAKE 1 TABLET BY MOUTH EVERY DAY AS NEEDED, Disp: 30 tablet, Rfl: 2 .  magic mouthwash w/lidocaine SOLN, Take 5 mLs 4 (four) times daily by mouth., Disp: 240 mL, Rfl: 0 .  metoCLOPramide (REGLAN) 5 MG tablet, TAKE 1 TABLET BY MOUTH 3 TIMES A DAY BEFORE MEALS, Disp: 90 tablet, Rfl: 1 .  niacin (NIASPAN) 1000 MG CR tablet, TAKE 1 TABLET (1,000 MG TOTAL) BY MOUTH AT BEDTIME. (Patient taking differently: TAKE 1 TABLET (1,500 MG TOTAL) BY MOUTH AT BEDTIME.), Disp: 30 tablet, Rfl: 5 .  omega-3 acid ethyl esters (LOVAZA) 1 g capsule, TAKE 2 CAPSULES (2 G TOTAL) BY MOUTH 2 (TWO) TIMES DAILY., Disp: 360 capsule, Rfl: 1 .  ondansetron (ZOFRAN) 4 MG tablet, Take 1 tablet (4 mg total) by mouth every 8 (eight) hours as needed for nausea., Disp: 15 tablet, Rfl: 0 .  ONETOUCH VERIO test strip, TEST BS 4 TIMES A DAY AND PRN., Disp: 100 each, Rfl: 4 .  OVER THE COUNTER MEDICATION, Take 1 capsule by mouth daily. Phillips- probiotic daily, Disp: , Rfl:  .  simvastatin (ZOCOR) 40 MG tablet, TAKE 1 TABLET (40 MG TOTAL) BY MOUTH AT BEDTIME., Disp: 90 tablet,  Rfl: 1 .  traMADol (ULTRAM) 50 MG tablet, Take 1 tablet (50 mg total) by mouth every 12 (twelve) hours as needed. for pain, Disp: 60 tablet, Rfl: 0 .  ULORIC 40 MG tablet, TAKE 1 TABLET BY MOUTH EVERY DAY, Disp: 90 tablet, Rfl: 1 .  fluconazole (DIFLUCAN) 150 MG tablet, 1 po q week x 4 weeks, Disp: 4 tablet, Rfl: 0 .  nystatin (NYSTATIN) powder, Apply topically 4 (four) times daily., Disp: 60 g, Rfl: 0 .  nystatin cream (MYCOSTATIN), Apply 1 application topically 2 (two) times daily., Disp: 30 g, Rfl: 2 Continue all other maintenance medications as listed above.  Follow up plan: Return if symptoms worsen or fail to improve.  Educational handout given for [place] PA-C [facility] [address]  [place], [place] [address] [phone]   01/09/2017, 1:29 PM

## 2017-01-10 LAB — HEPATIC FUNCTION PANEL
ALBUMIN: 4.2 g/dL (ref 3.5–5.5)
ALT: 30 IU/L (ref 0–44)
AST: 26 IU/L (ref 0–40)
Alkaline Phosphatase: 122 IU/L — ABNORMAL HIGH (ref 39–117)
Bilirubin Total: 0.3 mg/dL (ref 0.0–1.2)
Bilirubin, Direct: 0.07 mg/dL (ref 0.00–0.40)
Total Protein: 6.8 g/dL (ref 6.0–8.5)

## 2017-01-10 LAB — CBC WITH DIFFERENTIAL/PLATELET
Basophils Absolute: 0.1 10*3/uL (ref 0.0–0.2)
Basos: 1 %
EOS (ABSOLUTE): 0.3 10*3/uL (ref 0.0–0.4)
EOS: 4 %
HEMATOCRIT: 37.8 % (ref 37.5–51.0)
HEMOGLOBIN: 12.9 g/dL — AB (ref 13.0–17.7)
Immature Grans (Abs): 0 10*3/uL (ref 0.0–0.1)
Immature Granulocytes: 1 %
LYMPHS ABS: 2.9 10*3/uL (ref 0.7–3.1)
Lymphs: 35 %
MCH: 33.2 pg — AB (ref 26.6–33.0)
MCHC: 34.1 g/dL (ref 31.5–35.7)
MCV: 97 fL (ref 79–97)
MONOCYTES: 10 %
Monocytes Absolute: 0.8 10*3/uL (ref 0.1–0.9)
NEUTROS ABS: 4.1 10*3/uL (ref 1.4–7.0)
Neutrophils: 49 %
Platelets: 240 10*3/uL (ref 150–379)
RBC: 3.89 x10E6/uL — ABNORMAL LOW (ref 4.14–5.80)
RDW: 14.1 % (ref 12.3–15.4)
WBC: 8.3 10*3/uL (ref 3.4–10.8)

## 2017-01-10 LAB — BMP8+EGFR
BUN/Creatinine Ratio: 17 (ref 9–20)
BUN: 33 mg/dL — AB (ref 6–24)
CALCIUM: 9.4 mg/dL (ref 8.7–10.2)
CHLORIDE: 99 mmol/L (ref 96–106)
CO2: 24 mmol/L (ref 20–29)
Creatinine, Ser: 1.89 mg/dL — ABNORMAL HIGH (ref 0.76–1.27)
GFR calc non Af Amer: 41 mL/min/{1.73_m2} — ABNORMAL LOW (ref >59)
GFR, EST AFRICAN AMERICAN: 47 mL/min/{1.73_m2} — AB (ref >59)
Glucose: 207 mg/dL — ABNORMAL HIGH (ref 65–99)
POTASSIUM: 4.3 mmol/L (ref 3.5–5.2)
Sodium: 138 mmol/L (ref 134–144)

## 2017-01-10 LAB — LIPID PANEL
Chol/HDL Ratio: 3.2 ratio (ref 0.0–5.0)
Cholesterol, Total: 126 mg/dL (ref 100–199)
HDL: 39 mg/dL — AB (ref >39)
LDL Calculated: 63 mg/dL (ref 0–99)
TRIGLYCERIDES: 121 mg/dL (ref 0–149)
VLDL Cholesterol Cal: 24 mg/dL (ref 5–40)

## 2017-01-10 LAB — PSA, TOTAL AND FREE
PSA, Free Pct: 22.5 %
PSA, Free: 0.09 ng/mL
Prostate Specific Ag, Serum: 0.4 ng/mL (ref 0.0–4.0)

## 2017-01-10 LAB — TESTOSTERONE,FREE AND TOTAL
TESTOSTERONE: 306 ng/dL (ref 264–916)
Testosterone, Free: 6.1 pg/mL — ABNORMAL LOW (ref 6.8–21.5)

## 2017-01-10 LAB — VITAMIN D 25 HYDROXY (VIT D DEFICIENCY, FRACTURES): Vit D, 25-Hydroxy: 20.8 ng/mL — ABNORMAL LOW (ref 30.0–100.0)

## 2017-01-10 LAB — URIC ACID: URIC ACID: 6.6 mg/dL (ref 3.7–8.6)

## 2017-01-16 ENCOUNTER — Other Ambulatory Visit: Payer: Self-pay | Admitting: Family Medicine

## 2017-01-16 MED ORDER — CYCLOBENZAPRINE HCL 10 MG PO TABS: 10.0000 mg | ORAL_TABLET | Freq: Three times a day (TID) | ORAL | 0 refills | Status: DC | PRN

## 2017-01-18 ENCOUNTER — Encounter: Payer: Self-pay | Admitting: Family Medicine

## 2017-01-20 DIAGNOSIS — L304 Erythema intertrigo: Secondary | ICD-10-CM | POA: Diagnosis not present

## 2017-01-22 ENCOUNTER — Other Ambulatory Visit: Payer: Self-pay | Admitting: *Deleted

## 2017-01-22 MED ORDER — SIMVASTATIN 40 MG PO TABS: ORAL_TABLET | ORAL | 1 refills | Status: DC

## 2017-02-03 DIAGNOSIS — N183 Chronic kidney disease, stage 3 (moderate): Secondary | ICD-10-CM | POA: Diagnosis not present

## 2017-02-05 ENCOUNTER — Other Ambulatory Visit: Payer: Self-pay | Admitting: Nurse Practitioner

## 2017-02-10 ENCOUNTER — Other Ambulatory Visit: Payer: Self-pay | Admitting: Family Medicine

## 2017-02-12 ENCOUNTER — Ambulatory Visit: Payer: Medicare Other

## 2017-02-14 ENCOUNTER — Encounter: Payer: Self-pay | Admitting: Internal Medicine

## 2017-02-17 ENCOUNTER — Ambulatory Visit (INDEPENDENT_AMBULATORY_CARE_PROVIDER_SITE_OTHER): Payer: Medicare Other | Admitting: Family Medicine

## 2017-02-17 ENCOUNTER — Encounter: Payer: Self-pay | Admitting: Family Medicine

## 2017-02-17 VITALS — BP 144/94 | HR 104 | Temp 97.9°F | Ht 70.0 in | Wt 206.0 lb

## 2017-02-17 DIAGNOSIS — J0110 Acute frontal sinusitis, unspecified: Secondary | ICD-10-CM

## 2017-02-17 MED ORDER — AMOXICILLIN-POT CLAVULANATE 875-125 MG PO TABS
1.0000 | ORAL_TABLET | Freq: Two times a day (BID) | ORAL | 0 refills | Status: DC
Start: 2017-02-17 — End: 2017-02-28

## 2017-02-17 NOTE — Progress Notes (Signed)
BP (!) 141/99   Pulse (!) 108   Temp 97.9 F (36.6 C) (Oral)   Ht 5\' 10"  (1.778 m)   Wt 206 lb (93.4 kg)   BMI 29.56 kg/m    Subjective:    Patient ID: Patrick Brown, male    DOB: 1970-06-29, 47 y.o.   MRN: [id number]  HPI: Patrick Brown is a 47 y.o. male presenting on 02/17/2017 for Cough (productive, discolored, deep) and Nasal Congestion   HPI Cough and congestion and sinus pressure Patient is coming in with cough and sinus congestion and sinus pressure that is been going on for the past 4 days.  Patient has known HIV and chronic kidney disease and is on antivirals for HIV.  He also has known diabetes which puts him in immunosuppressive category.  He says these sinus infections frequently for him have ended up being a pneumonia and that is what he is concerned about.  He denies any fevers or chills or shortness of breath or wheezing today but is concerned it may get to that point.  Relevant past medical, surgical, family and social history reviewed and updated as indicated. Interim medical history since our last visit reviewed. Allergies and medications reviewed and updated.  Review of Systems  Constitutional: Negative for chills and fever.  HENT: Positive for congestion, postnasal drip, rhinorrhea, sinus pressure and sore throat. Negative for ear discharge, ear pain, sneezing and voice change.   Eyes: Negative for pain, discharge, redness and visual disturbance.  Respiratory: Positive for cough. Negative for shortness of breath and wheezing.   Cardiovascular: Negative for chest pain and leg swelling.  Musculoskeletal: Negative for gait problem.  Skin: Negative for rash.  All other systems reviewed and are negative.   Per HPI unless specifically indicated above        Objective:    BP (!) 141/99   Pulse (!) 108   Temp 97.9 F (36.6 C) (Oral)   Ht 5\' 10"  (1.778 m)   Wt 206 lb (93.4 kg)   BMI 29.56 kg/m   Wt Readings from Last 3 Encounters:  02/17/17 206 lb (93.4  kg)  01/09/17 214 lb (97.1 kg)  01/03/17 209 lb (94.8 kg)    Physical Exam  Constitutional: He is oriented to person, place, and time. He appears well-developed and well-nourished. No distress.  HENT:  Right Ear: Tympanic membrane, external ear and ear canal normal.  Left Ear: Tympanic membrane, external ear and ear canal normal.  Nose: Mucosal edema and rhinorrhea present. No sinus tenderness. No epistaxis. Right sinus exhibits maxillary sinus tenderness. Right sinus exhibits no frontal sinus tenderness. Left sinus exhibits maxillary sinus tenderness. Left sinus exhibits no frontal sinus tenderness.  Mouth/Throat: Uvula is midline and mucous membranes are normal. Posterior oropharyngeal edema and posterior oropharyngeal erythema present. No oropharyngeal exudate or tonsillar abscesses.  Eyes: Conjunctivae and EOM are normal. Pupils are equal, round, and reactive to light. Right eye exhibits no discharge. No scleral icterus.  Neck: Neck supple. No thyromegaly present.  Cardiovascular: Normal rate, regular rhythm, normal heart sounds and intact distal pulses.  No murmur heard. Pulmonary/Chest: Effort normal and breath sounds normal. No respiratory distress. He has no wheezes. He has no rales.  Musculoskeletal: Normal range of motion. He exhibits no edema.  Lymphadenopathy:    He has no cervical adenopathy.  Neurological: He is alert and oriented to person, place, and time. Coordination normal.  Skin: Skin is warm and dry. No rash noted. He is not diaphoretic.  Psychiatric: He has a normal mood and affect. His behavior is normal.  Nursing note and vitals reviewed.     Assessment & Plan:   Problem List Items Addressed This Visit    None    Visit Diagnoses    Acute non-recurrent frontal sinusitis    -  Primary   Relevant Medications   amoxicillin-clavulanate (AUGMENTIN) 875-125 MG tablet      Follow up plan: Return if symptoms worsen or fail to improve.  Counseling provided for all  of the vaccine components No orders of the defined types were placed in this encounter.   Caryl Pina, MD [facility] 02/17/2017, 11:01 AM

## 2017-02-21 ENCOUNTER — Encounter: Payer: Self-pay | Admitting: Family Medicine

## 2017-02-21 ENCOUNTER — Ambulatory Visit (INDEPENDENT_AMBULATORY_CARE_PROVIDER_SITE_OTHER): Payer: Medicare Other | Admitting: Family Medicine

## 2017-02-21 ENCOUNTER — Other Ambulatory Visit: Payer: Self-pay | Admitting: Family Medicine

## 2017-02-21 VITALS — BP 125/88 | HR 103 | Temp 97.4°F | Ht 70.0 in | Wt 205.0 lb

## 2017-02-21 DIAGNOSIS — R55 Syncope and collapse: Secondary | ICD-10-CM

## 2017-02-21 DIAGNOSIS — R05 Cough: Secondary | ICD-10-CM | POA: Diagnosis not present

## 2017-02-21 DIAGNOSIS — I10 Essential (primary) hypertension: Secondary | ICD-10-CM | POA: Diagnosis not present

## 2017-02-21 DIAGNOSIS — R252 Cramp and spasm: Secondary | ICD-10-CM | POA: Diagnosis not present

## 2017-02-21 DIAGNOSIS — E119 Type 2 diabetes mellitus without complications: Secondary | ICD-10-CM | POA: Diagnosis not present

## 2017-02-21 DIAGNOSIS — E039 Hypothyroidism, unspecified: Secondary | ICD-10-CM | POA: Diagnosis not present

## 2017-02-21 DIAGNOSIS — Z794 Long term (current) use of insulin: Secondary | ICD-10-CM | POA: Diagnosis not present

## 2017-02-21 DIAGNOSIS — Z9641 Presence of insulin pump (external) (internal): Secondary | ICD-10-CM | POA: Diagnosis not present

## 2017-02-21 NOTE — Progress Notes (Signed)
BP 125/88   Pulse (!) 103   Temp (!) 97.4 F (36.3 C) (Oral)   Ht 5' 10" (1.778 m)   Wt 205 lb (93 kg)   BMI 29.41 kg/m    Subjective:    Patient ID: Patrick Brown, male    DOB: September 25, 1970, 47 y.o.   MRN: [id number]  HPI: Patrick Brown is a 47 y.o. male presenting on 02/21/2017 for Near syncopal episode this morning, fatigue, leg cramping (recently his potassium was discontinued, would like to have a BMET today)   HPI Near syncope and lightheadedness and leg cramping Patient comes in complaining of cramping in his legs that has been going on over the past week and then a near syncopal episode when he was getting in the shower this morning where he felt like he nearly passed out and had to sit down because of it.  Since that this morning he has not felt like that anymore but he has been generally feeling fatigued and having intermittent leg cramping.  He says this is all been going on since he stopped his Florinef just over a week ago because they were going to try him off of it and see if he needs any more.  He denies any fevers or chills or cough or congestion or wheezing and the antibiotic that he had for sinus infection last week is really working well.  Relevant past medical, surgical, family and social history reviewed and updated as indicated. Interim medical history since our last visit reviewed. Allergies and medications reviewed and updated.  Review of Systems  Constitutional: Positive for fatigue. Negative for chills and fever.  Respiratory: Negative for shortness of breath and wheezing.   Cardiovascular: Negative for chest pain and leg swelling.  Musculoskeletal: Positive for myalgias. Negative for back pain and gait problem.  Skin: Negative for rash.  Neurological: Positive for light-headedness. Negative for syncope, speech difficulty, weakness and numbness.  All other systems reviewed and are negative.   Per HPI unless specifically indicated above          Objective:    BP 125/88   Pulse (!) 103   Temp (!) 97.4 F (36.3 C) (Oral)   Ht 5' 10" (1.778 m)   Wt 205 lb (93 kg)   BMI 29.41 kg/m   Wt Readings from Last 3 Encounters:  02/21/17 205 lb (93 kg)  02/17/17 206 lb (93.4 kg)  01/09/17 214 lb (97.1 kg)    Physical Exam  Constitutional: He is oriented to person, place, and time. He appears well-developed and well-nourished. No distress.  Eyes: Conjunctivae are normal. No scleral icterus.  Neck: Neck supple. No thyromegaly present.  Cardiovascular: Normal rate, regular rhythm, normal heart sounds and intact distal pulses.  No murmur heard. Pulmonary/Chest: Effort normal and breath sounds normal. No respiratory distress. He has no wheezes. He has no rales.  Musculoskeletal: Normal range of motion. He exhibits no edema.  Lymphadenopathy:    He has no cervical adenopathy.  Neurological: He is alert and oriented to person, place, and time. Coordination normal.  Skin: Skin is warm and dry. No rash noted. He is not diaphoretic.  Psychiatric: He has a normal mood and affect. His behavior is normal.  Nursing note and vitals reviewed.   Results for orders placed or performed in visit on 01/09/17  Uric acid  Result Value Ref Range   Uric Acid 6.6 3.7 - 8.6 mg/dL  PSA, total and free  Result Value Ref Range  Prostate Specific Ag, Serum 0.4 0.0 - 4.0 ng/mL   PSA, Free 0.09 N/A ng/mL   PSA, Free Pct 22.5 %  Testosterone,Free and Total  Result Value Ref Range   Testosterone 306 264 - 916 ng/dL   Testosterone, Free 6.1 (L) 6.8 - 21.5 pg/mL  Hepatic function panel  Result Value Ref Range   Total Protein 6.8 6.0 - 8.5 g/dL   Albumin 4.2 3.5 - 5.5 g/dL   Bilirubin Total 0.3 0.0 - 1.2 mg/dL   Bilirubin, Direct 0.07 0.00 - 0.40 mg/dL   Alkaline Phosphatase 122 (H) 39 - 117 IU/L   AST 26 0 - 40 IU/L   ALT 30 0 - 44 IU/L  Bayer DCA Hb A1c Waived  Result Value Ref Range   Bayer DCA Hb A1c Waived 7.5 (H) <7.0 %  Lipid panel  Result  Value Ref Range   Cholesterol, Total 126 100 - 199 mg/dL   Triglycerides 121 0 - 149 mg/dL   HDL 39 (L) >39 mg/dL   VLDL Cholesterol Cal 24 5 - 40 mg/dL   LDL Calculated 63 0 - 99 mg/dL   Chol/HDL Ratio 3.2 0.0 - 5.0 ratio  VITAMIN D 25 Hydroxy (Vit-D Deficiency, Fractures)  Result Value Ref Range   Vit D, 25-Hydroxy 20.8 (L) 30.0 - 100.0 ng/mL  BMP8+EGFR  Result Value Ref Range   Glucose 207 (H) 65 - 99 mg/dL   BUN 33 (H) 6 - 24 mg/dL   Creatinine, Ser 1.89 (H) 0.76 - 1.27 mg/dL   GFR calc non Af Amer 41 (L) >59 mL/min/1.73   GFR calc Af Amer 47 (L) >59 mL/min/1.73   BUN/Creatinine Ratio 17 9 - 20   Sodium 138 134 - 144 mmol/L   Potassium 4.3 3.5 - 5.2 mmol/L   Chloride 99 96 - 106 mmol/L   CO2 24 20 - 29 mmol/L   Calcium 9.4 8.7 - 10.2 mg/dL  CBC with Differential/Platelet  Result Value Ref Range   WBC 8.3 3.4 - 10.8 x10E3/uL   RBC 3.89 (L) 4.14 - 5.80 x10E6/uL   Hemoglobin 12.9 (L) 13.0 - 17.7 g/dL   Hematocrit 37.8 37.5 - 51.0 %   MCV 97 79 - 97 fL   MCH 33.2 (H) 26.6 - 33.0 pg   MCHC 34.1 31.5 - 35.7 g/dL   RDW 14.1 12.3 - 15.4 %   Platelets 240 150 - 379 x10E3/uL   Neutrophils 49 Not Estab. %   Lymphs 35 Not Estab. %   Monocytes 10 Not Estab. %   Eos 4 Not Estab. %   Basos 1 Not Estab. %   Neutrophils Absolute 4.1 1.4 - 7.0 x10E3/uL   Lymphocytes Absolute 2.9 0.7 - 3.1 x10E3/uL   Monocytes Absolute 0.8 0.1 - 0.9 x10E3/uL   EOS (ABSOLUTE) 0.3 0.0 - 0.4 x10E3/uL   Basophils Absolute 0.1 0.0 - 0.2 x10E3/uL   Immature Granulocytes 1 Not Estab. %   Immature Grans (Abs) 0.0 0.0 - 0.1 x10E3/uL      Assessment & Plan:   Problem List Items Addressed This Visit    None    Visit Diagnoses    Near syncope    -  Primary   Likely vasovagal versus electrolyte abnormality as he recently stopped Florinef, will check chem panel   Relevant Orders   CMP14+EGFR   Leg cramping       Relevant Orders   CMP14+EGFR       Follow up plan: Return if symptoms worsen  or fail  to improve.  Counseling provided for all of the vaccine components Orders Placed This Encounter  Procedures  . [place], MD [facility] 02/21/2017, 1:54 PM

## 2017-02-22 DIAGNOSIS — E039 Hypothyroidism, unspecified: Secondary | ICD-10-CM | POA: Diagnosis not present

## 2017-02-22 DIAGNOSIS — R05 Cough: Secondary | ICD-10-CM | POA: Diagnosis not present

## 2017-02-22 LAB — CMP14+EGFR
A/G RATIO: 1.4 (ref 1.2–2.2)
ALT: 49 IU/L — ABNORMAL HIGH (ref 0–44)
AST: 46 IU/L — ABNORMAL HIGH (ref 0–40)
Albumin: 4.4 g/dL (ref 3.5–5.5)
Alkaline Phosphatase: 125 IU/L — ABNORMAL HIGH (ref 39–117)
BUN/Creatinine Ratio: 18 (ref 9–20)
BUN: 48 mg/dL — ABNORMAL HIGH (ref 6–24)
Bilirubin Total: 0.2 mg/dL (ref 0.0–1.2)
CALCIUM: 9.1 mg/dL (ref 8.7–10.2)
CO2: 17 mmol/L — ABNORMAL LOW (ref 20–29)
CREATININE: 2.63 mg/dL — AB (ref 0.76–1.27)
Chloride: 97 mmol/L (ref 96–106)
GFR, EST AFRICAN AMERICAN: 32 mL/min/{1.73_m2} — AB (ref >59)
GFR, EST NON AFRICAN AMERICAN: 28 mL/min/{1.73_m2} — AB (ref >59)
GLOBULIN, TOTAL: 3.1 g/dL (ref 1.5–4.5)
Glucose: 238 mg/dL — ABNORMAL HIGH (ref 65–99)
POTASSIUM: 4.7 mmol/L (ref 3.5–5.2)
SODIUM: 135 mmol/L (ref 134–144)
TOTAL PROTEIN: 7.5 g/dL (ref 6.0–8.5)

## 2017-02-25 ENCOUNTER — Telehealth: Payer: Self-pay

## 2017-02-25 NOTE — Telephone Encounter (Signed)
Patient calling to say his primary care physician advised him of abnormal labs and stated it may be related to his HIV regimen.  Patient says labs are in [place].  I will sent to Dr Linus Salmons for review and call the patient once reviewed.   Laverle Patter, RN

## 2017-02-25 NOTE — Telephone Encounter (Signed)
Thank you for the information I have advised him to stop his statin for now and we will have to monitor closely.  I just did not know if these medications could cause an issue as well. Thanks Research scientist (life sciences)

## 2017-02-25 NOTE — Telephone Encounter (Signed)
Thanks Tammy - I have copied the physician directly.  [place]'s Triumeq can cause some LFT abnormalities occasionally, not typically creat issues.  I am not sure of his current medications since I saw him but androgel, nexium, fluconazole as well as the statin as you said are possible.  I do not feel he needs to stop the Triumeq.  Will see how it looks next week when you recheck it.

## 2017-02-28 ENCOUNTER — Encounter: Payer: Self-pay | Admitting: Family Medicine

## 2017-02-28 ENCOUNTER — Ambulatory Visit (INDEPENDENT_AMBULATORY_CARE_PROVIDER_SITE_OTHER): Payer: Medicare Other | Admitting: Family Medicine

## 2017-02-28 VITALS — BP 126/87 | HR 93 | Temp 97.0°F | Ht 70.0 in | Wt 204.6 lb

## 2017-02-28 DIAGNOSIS — N183 Chronic kidney disease, stage 3 unspecified: Secondary | ICD-10-CM

## 2017-02-28 DIAGNOSIS — J0181 Other acute recurrent sinusitis: Secondary | ICD-10-CM

## 2017-02-28 DIAGNOSIS — J0110 Acute frontal sinusitis, unspecified: Secondary | ICD-10-CM | POA: Diagnosis not present

## 2017-02-28 DIAGNOSIS — R945 Abnormal results of liver function studies: Secondary | ICD-10-CM | POA: Diagnosis not present

## 2017-02-28 DIAGNOSIS — R7989 Other specified abnormal findings of blood chemistry: Secondary | ICD-10-CM

## 2017-02-28 MED ORDER — BENZONATATE 200 MG PO CAPS: ORAL_CAPSULE | ORAL | 0 refills | Status: DC

## 2017-02-28 MED ORDER — AMOXICILLIN-POT CLAVULANATE 875-125 MG PO TABS: 1.0000 | ORAL_TABLET | Freq: Two times a day (BID) | ORAL | 0 refills | Status: DC

## 2017-02-28 NOTE — Patient Instructions (Signed)
Continue with nasal saline Continue with Augmentin and Tessalon Perles Drink plenty of fluids and stay well-hydrated And discontinue Tylenol as much as possible Continue to hold statin drug Future order for ultrasound of abdomen to include the liver Repeat BMP and liver function test in 1 week Discussed these abnormalities with infectious disease and make sure that there is no medication from that side that could be contributing to these elevations. Continue to follow-up with nephrology and infectious disease If liver function tests persist and get worse despite leaving off statin drug and reducing and discontinuing Tylenol get ultrasound of abdomen and liver as mentioned above.

## 2017-02-28 NOTE — Addendum Note (Signed)
Addended by: Marin Olp on: 02/28/2017 11:26 AM   Modules accepted: Orders

## 2017-02-28 NOTE — Addendum Note (Signed)
Addended by: Marin Olp on: 02/28/2017 11:31 AM   Modules accepted: Orders

## 2017-02-28 NOTE — Progress Notes (Signed)
Subjective:    Patient ID: Patrick Brown, male    DOB: [birth year], 47 y.o.   MRN: [id number]  HPI  Pt here for recheck of elevated liver function and kidney functions. Pt has discontinued Simvastatin but takes Tylenol '4000mg'$  daily.  The patient has had recent liver and kidney function test.  Several liver function tests were elevated.  He was taken off of his cholesterol medicine.  He does continue to take 4000 mg of Tylenol daily and has been taking this regularly for almost a year.  The alkaline phosphatase AST and ALT were all elevated but the alkaline phosphatase was most elevated of those liver function test.  But all were slightly elevated.  Also his most recent creatinine was elevated at 2.63.  The previous BMP was 1.89 on the creatinine.  The patient comes to the visit today with his mother who is concerned about these recent liver test elevation and creatinine elevations.  He has a phone call into his infectious disease doctor to make sure there is nothing from that side causing the elevated liver function test.  He has held his statin drug.  He is going to hold his Tylenol.  Since his has been only a week since these were checked we will wait for another week before we recheck liver function test and the BMP.          Review of Systems  Constitutional: Positive for fatigue.  HENT: Positive for postnasal drip.   Respiratory: Positive for cough (continued cough). Negative for wheezing.   Gastrointestinal: Negative for abdominal pain.       Objective:   Physical Exam  Constitutional: He is oriented to person, place, and time. He appears well-developed and well-nourished. No distress.  HENT:  Head: Normocephalic and atraumatic.  Right Ear: External ear normal.  Left Ear: External ear normal.  Mouth/Throat: Oropharynx is clear and moist. No oropharyngeal exudate.  Nasal congestion bilaterally  Eyes: Conjunctivae and EOM are normal. Pupils are equal, round, and reactive to  light. Right eye exhibits no discharge. Left eye exhibits no discharge. No scleral icterus.  Neck: Normal range of motion. Neck supple. No thyromegaly present.  No adenopathy palpable and no thyromegaly  Cardiovascular: Normal rate, regular rhythm and normal heart sounds.  No murmur heard. Pulmonary/Chest: Effort normal and breath sounds normal. No respiratory distress. He has no wheezes. He has no rales.  Abdominal: Soft. Bowel sounds are normal. He exhibits no mass. There is no tenderness. There is no rebound and no guarding.  Genitourinary: Rectum normal.  Musculoskeletal: Normal range of motion. He exhibits no edema.  Lymphadenopathy:    He has no cervical adenopathy.  Neurological: He is alert and oriented to person, place, and time.  Skin: Skin is warm and dry. No rash noted.  Psychiatric: He has a normal mood and affect. His behavior is normal. Judgment and thought content normal.  Nursing note and vitals reviewed.  BP 126/87 (BP Location: Left Arm, Patient Position: Sitting, Cuff Size: Normal)   Pulse 93   Temp (!) 97 F (36.1 C) (Oral)   Ht '5\' 10"'$  (1.778 m)   Wt 204 lb 9.6 oz (92.8 kg)   BMI 29.36 kg/m        Assessment & Plan:  1. Elevated liver function tests -Continue to leave off statin drug and also now discontinue Tylenol - Hepatic function panel - US Abdomen Complete; Future  2. Elevated serum creatinine -Continue to avoid NSAIDs as he is already doing  and recheck BMP in [redacted] week along with liver function test - BMP8+EGFR  3. Chronic kidney disease, stage 3, mod decreased GFR (HCC) -Continue to follow-up with nephrology  4. Other acute recurrent sinusitis -Take Augmentin 875 twice daily for 1 more week along with continuing Tessalon Perles as needed for cough -Use cool mist humidification and drink plenty of fluids  Patient Instructions  Continue with nasal saline Continue with Augmentin and Tessalon Perles Drink plenty of fluids and stay  well-hydrated And discontinue Tylenol as much as possible Continue to hold statin drug Future order for ultrasound of abdomen to include the liver Repeat BMP and liver function test in 1 week Discussed these abnormalities with infectious disease and make sure that there is no medication from that side that could be contributing to these elevations. Continue to follow-up with nephrology and infectious disease If liver function tests persist and get worse despite leaving off statin drug and reducing and discontinuing Tylenol get ultrasound of abdomen and liver as mentioned above.  Arrie Senate MD

## 2017-03-03 NOTE — Telephone Encounter (Signed)
Patient called to make sure Dr Linus Salmons was in contact with his primary care physician regarding lab results, medication changes.  RN assured him they are.  Landis Gandy, RN

## 2017-03-07 ENCOUNTER — Other Ambulatory Visit: Payer: Medicare Other

## 2017-03-07 DIAGNOSIS — R7989 Other specified abnormal findings of blood chemistry: Secondary | ICD-10-CM

## 2017-03-07 DIAGNOSIS — R945 Abnormal results of liver function studies: Secondary | ICD-10-CM | POA: Diagnosis not present

## 2017-03-08 ENCOUNTER — Observation Stay (HOSPITAL_COMMUNITY)
Admission: EM | Admit: 2017-03-08 | Discharge: 2017-03-08 | Discharge status: Against Medical Advice | Payer: Medicare Other | Attending: Internal Medicine | Admitting: Internal Medicine

## 2017-03-08 ENCOUNTER — Telehealth: Payer: Self-pay | Admitting: Family Medicine

## 2017-03-08 ENCOUNTER — Encounter (HOSPITAL_COMMUNITY): Payer: Self-pay | Admitting: Emergency Medicine

## 2017-03-08 DIAGNOSIS — E039 Hypothyroidism, unspecified: Secondary | ICD-10-CM | POA: Diagnosis not present

## 2017-03-08 DIAGNOSIS — Z79899 Other long term (current) drug therapy: Secondary | ICD-10-CM | POA: Insufficient documentation

## 2017-03-08 DIAGNOSIS — Z888 Allergy status to other drugs, medicaments and biological substances status: Secondary | ICD-10-CM | POA: Insufficient documentation

## 2017-03-08 DIAGNOSIS — B20 Human immunodeficiency virus [HIV] disease: Secondary | ICD-10-CM | POA: Insufficient documentation

## 2017-03-08 DIAGNOSIS — K3184 Gastroparesis: Secondary | ICD-10-CM | POA: Insufficient documentation

## 2017-03-08 DIAGNOSIS — E1021 Type 1 diabetes mellitus with diabetic nephropathy: Secondary | ICD-10-CM | POA: Insufficient documentation

## 2017-03-08 DIAGNOSIS — Z961 Presence of intraocular lens: Secondary | ICD-10-CM | POA: Insufficient documentation

## 2017-03-08 DIAGNOSIS — I10 Essential (primary) hypertension: Secondary | ICD-10-CM | POA: Diagnosis not present

## 2017-03-08 DIAGNOSIS — N183 Chronic kidney disease, stage 3 (moderate): Secondary | ICD-10-CM

## 2017-03-08 DIAGNOSIS — F329 Major depressive disorder, single episode, unspecified: Secondary | ICD-10-CM | POA: Diagnosis not present

## 2017-03-08 DIAGNOSIS — G4733 Obstructive sleep apnea (adult) (pediatric): Secondary | ICD-10-CM | POA: Insufficient documentation

## 2017-03-08 DIAGNOSIS — Z8042 Family history of malignant neoplasm of prostate: Secondary | ICD-10-CM | POA: Insufficient documentation

## 2017-03-08 DIAGNOSIS — Z9889 Other specified postprocedural states: Secondary | ICD-10-CM | POA: Insufficient documentation

## 2017-03-08 DIAGNOSIS — E2740 Unspecified adrenocortical insufficiency: Secondary | ICD-10-CM | POA: Diagnosis not present

## 2017-03-08 DIAGNOSIS — Z7982 Long term (current) use of aspirin: Secondary | ICD-10-CM | POA: Insufficient documentation

## 2017-03-08 DIAGNOSIS — E1043 Type 1 diabetes mellitus with diabetic autonomic (poly)neuropathy: Secondary | ICD-10-CM | POA: Diagnosis not present

## 2017-03-08 DIAGNOSIS — Z79890 Hormone replacement therapy (postmenopausal): Secondary | ICD-10-CM | POA: Diagnosis not present

## 2017-03-08 DIAGNOSIS — K219 Gastro-esophageal reflux disease without esophagitis: Secondary | ICD-10-CM | POA: Insufficient documentation

## 2017-03-08 DIAGNOSIS — N179 Acute kidney failure, unspecified: Secondary | ICD-10-CM | POA: Diagnosis not present

## 2017-03-08 DIAGNOSIS — Z79891 Long term (current) use of opiate analgesic: Secondary | ICD-10-CM | POA: Insufficient documentation

## 2017-03-08 DIAGNOSIS — E1022 Type 1 diabetes mellitus with diabetic chronic kidney disease: Secondary | ICD-10-CM | POA: Diagnosis not present

## 2017-03-08 DIAGNOSIS — D509 Iron deficiency anemia, unspecified: Secondary | ICD-10-CM | POA: Insufficient documentation

## 2017-03-08 DIAGNOSIS — N189 Chronic kidney disease, unspecified: Secondary | ICD-10-CM

## 2017-03-08 DIAGNOSIS — Z882 Allergy status to sulfonamides status: Secondary | ICD-10-CM | POA: Diagnosis not present

## 2017-03-08 DIAGNOSIS — I129 Hypertensive chronic kidney disease with stage 1 through stage 4 chronic kidney disease, or unspecified chronic kidney disease: Secondary | ICD-10-CM | POA: Diagnosis not present

## 2017-03-08 DIAGNOSIS — Z9641 Presence of insulin pump (external) (internal): Secondary | ICD-10-CM | POA: Diagnosis not present

## 2017-03-08 DIAGNOSIS — E785 Hyperlipidemia, unspecified: Secondary | ICD-10-CM | POA: Insufficient documentation

## 2017-03-08 DIAGNOSIS — E875 Hyperkalemia: Principal | ICD-10-CM | POA: Insufficient documentation

## 2017-03-08 DIAGNOSIS — Z9841 Cataract extraction status, right eye: Secondary | ICD-10-CM | POA: Insufficient documentation

## 2017-03-08 DIAGNOSIS — Z833 Family history of diabetes mellitus: Secondary | ICD-10-CM | POA: Insufficient documentation

## 2017-03-08 DIAGNOSIS — R5383 Other fatigue: Secondary | ICD-10-CM | POA: Diagnosis not present

## 2017-03-08 DIAGNOSIS — Z7951 Long term (current) use of inhaled steroids: Secondary | ICD-10-CM | POA: Diagnosis not present

## 2017-03-08 DIAGNOSIS — Z794 Long term (current) use of insulin: Secondary | ICD-10-CM | POA: Diagnosis not present

## 2017-03-08 DIAGNOSIS — Z818 Family history of other mental and behavioral disorders: Secondary | ICD-10-CM | POA: Insufficient documentation

## 2017-03-08 DIAGNOSIS — Z96641 Presence of right artificial hip joint: Secondary | ICD-10-CM | POA: Insufficient documentation

## 2017-03-08 LAB — URINALYSIS, ROUTINE W REFLEX MICROSCOPIC
Bilirubin Urine: NEGATIVE
Glucose, UA: 50 mg/dL — AB
Hgb urine dipstick: NEGATIVE
Ketones, ur: NEGATIVE mg/dL
Leukocytes, UA: NEGATIVE
NITRITE: NEGATIVE
Protein, ur: NEGATIVE mg/dL
SPECIFIC GRAVITY, URINE: 1.01 (ref 1.005–1.030)
pH: 5 (ref 5.0–8.0)

## 2017-03-08 LAB — BASIC METABOLIC PANEL
BUN/Creatinine Ratio: 18 (ref 9–20)
BUN: 56 mg/dL — AB (ref 6–24)
CALCIUM: 10.4 mg/dL — AB (ref 8.7–10.2)
CHLORIDE: 96 mmol/L (ref 96–106)
CO2: 19 mmol/L — AB (ref 20–29)
Creatinine, Ser: 3.1 mg/dL (ref 0.76–1.27)
GFR calc non Af Amer: 23 mL/min/{1.73_m2} — ABNORMAL LOW (ref >59)
GFR, EST AFRICAN AMERICAN: 26 mL/min/{1.73_m2} — AB (ref >59)
Glucose: 194 mg/dL — ABNORMAL HIGH (ref 65–99)
POTASSIUM: 6.8 mmol/L — AB (ref 3.5–5.2)
Sodium: 131 mmol/L — ABNORMAL LOW (ref 134–144)

## 2017-03-08 LAB — HEPATIC FUNCTION PANEL
ALBUMIN: 4.5 g/dL (ref 3.5–5.5)
ALT: 23 IU/L (ref 0–44)
AST: 23 IU/L (ref 0–40)
Alkaline Phosphatase: 124 IU/L — ABNORMAL HIGH (ref 39–117)
BILIRUBIN TOTAL: 0.5 mg/dL (ref 0.0–1.2)
Bilirubin, Direct: 0.11 mg/dL (ref 0.00–0.40)
Total Protein: 7.7 g/dL (ref 6.0–8.5)

## 2017-03-08 LAB — CREATININE, URINE, RANDOM: CREATININE, URINE: 46.86 mg/dL

## 2017-03-08 LAB — CBG MONITORING, ED: GLUCOSE-CAPILLARY: 164 mg/dL — AB (ref 65–99)

## 2017-03-08 MED ORDER — INSULIN ASPART 100 UNIT/ML ~~LOC~~ SOLN
5.0000 [IU] | Freq: Once | SUBCUTANEOUS | Status: AC
  Administered 2017-03-08: 5 [IU] via INTRAVENOUS
  Filled 2017-03-08: qty 1

## 2017-03-08 MED ORDER — OMEGA-3-ACID ETHYL ESTERS 1 G PO CAPS: 1.0000 g | ORAL_CAPSULE | Freq: Every day | ORAL | Status: DC

## 2017-03-08 MED ORDER — FERROUS SULFATE 325 (65 FE) MG PO TABS: 650.0000 mg | ORAL_TABLET | Freq: Every day | ORAL | Status: DC

## 2017-03-08 MED ORDER — FUROSEMIDE 40 MG PO TABS: 20.0000 mg | ORAL_TABLET | Freq: Two times a day (BID) | ORAL | Status: DC

## 2017-03-08 MED ORDER — DEXTROSE 50 % IV SOLN
1.0000 | Freq: Once | INTRAVENOUS | Status: AC
  Administered 2017-03-08: 50 mL via INTRAVENOUS
  Filled 2017-03-08: qty 50

## 2017-03-08 MED ORDER — ABACAVIR-DOLUTEGRAVIR-LAMIVUD 600-50-300 MG PO TABS: 1.0000 | ORAL_TABLET | Freq: Every day | ORAL | Status: DC

## 2017-03-08 MED ORDER — SIMVASTATIN 20 MG PO TABS: 20.0000 mg | ORAL_TABLET | Freq: Every day | ORAL | Status: DC

## 2017-03-08 MED ORDER — GLUCOSE BLOOD VI STRP: 1.0000 | ORAL_STRIP | Status: DC | PRN

## 2017-03-08 MED ORDER — INSULIN ASPART 100 UNIT/ML ~~LOC~~ SOLN: 36.0000 [IU] | SUBCUTANEOUS | Status: DC

## 2017-03-08 MED ORDER — LORAZEPAM 0.5 MG PO TABS: 0.5000 mg | ORAL_TABLET | Freq: Every day | ORAL | Status: DC | PRN

## 2017-03-08 MED ORDER — FEBUXOSTAT 40 MG PO TABS: 40.0000 mg | ORAL_TABLET | Freq: Every day | ORAL | Status: DC

## 2017-03-08 MED ORDER — PANTOPRAZOLE SODIUM 40 MG PO TBEC: 40.0000 mg | DELAYED_RELEASE_TABLET | Freq: Every day | ORAL | Status: DC

## 2017-03-08 MED ORDER — NIACIN ER (ANTIHYPERLIPIDEMIC) 1000 MG PO TBCR: 1000.0000 mg | EXTENDED_RELEASE_TABLET | Freq: Every day | ORAL | Status: DC

## 2017-03-08 MED ORDER — SODIUM CHLORIDE 0.9 % IV SOLN
1.0000 g | Freq: Once | INTRAVENOUS | Status: AC
  Administered 2017-03-08: 1 g via INTRAVENOUS
  Filled 2017-03-08: qty 10

## 2017-03-08 MED ORDER — SODIUM CHLORIDE 0.9 % IV BOLUS (SEPSIS)
1000.0000 mL | Freq: Once | INTRAVENOUS | Status: AC
  Administered 2017-03-08: 1000 mL via INTRAVENOUS

## 2017-03-08 MED ORDER — SODIUM CHLORIDE 0.9 % IV SOLN: INTRAVENOUS | Status: DC

## 2017-03-08 MED ORDER — DOLUTEGRAVIR SODIUM 50 MG PO TABS: 50.0000 mg | ORAL_TABLET | Freq: Every day | ORAL | Status: DC

## 2017-03-08 MED ORDER — HEPARIN SODIUM (PORCINE) 5000 UNIT/ML IJ SOLN: 5000.0000 [IU] | Freq: Three times a day (TID) | INTRAMUSCULAR | Status: DC

## 2017-03-08 MED ORDER — LORATADINE 10 MG PO TABS: 10.0000 mg | ORAL_TABLET | Freq: Every day | ORAL | Status: DC

## 2017-03-08 MED ORDER — SODIUM POLYSTYRENE SULFONATE 15 GM/60ML PO SUSP: 30.0000 g | Freq: Once | ORAL | Status: DC

## 2017-03-08 MED ORDER — LAMIVUDINE 150 MG PO TABS: 150.0000 mg | ORAL_TABLET | Freq: Every day | ORAL | Status: DC

## 2017-03-08 MED ORDER — HYDRALAZINE HCL 20 MG/ML IJ SOLN: 10.0000 mg | Freq: Three times a day (TID) | INTRAMUSCULAR | Status: DC | PRN

## 2017-03-08 MED ORDER — ABACAVIR SULFATE 300 MG PO TABS: 600.0000 mg | ORAL_TABLET | Freq: Every day | ORAL | Status: DC

## 2017-03-08 MED ORDER — HYDROCODONE-HOMATROPINE 5-1.5 MG/5ML PO SYRP: 5.0000 mL | ORAL_SOLUTION | Freq: Four times a day (QID) | ORAL | Status: DC | PRN

## 2017-03-08 MED ORDER — METOCLOPRAMIDE HCL 10 MG PO TABS: 5.0000 mg | ORAL_TABLET | Freq: Two times a day (BID) | ORAL | Status: DC

## 2017-03-08 MED ORDER — ACYCLOVIR 400 MG PO TABS: 400.0000 mg | ORAL_TABLET | Freq: Two times a day (BID) | ORAL | Status: DC

## 2017-03-08 MED ORDER — SODIUM POLYSTYRENE SULFONATE 15 GM/60ML PO SUSP
15.0000 g | Freq: Once | ORAL | Status: DC
  Filled 2017-03-08: qty 60

## 2017-03-08 MED ORDER — LEVOTHYROXINE SODIUM 25 MCG PO TABS: 175.0000 ug | ORAL_TABLET | Freq: Every day | ORAL | Status: DC

## 2017-03-08 MED ORDER — ACETAMINOPHEN 325 MG PO TABS: 650.0000 mg | ORAL_TABLET | Freq: Three times a day (TID) | ORAL | Status: DC | PRN

## 2017-03-08 MED ORDER — DULOXETINE HCL 30 MG PO CPEP: 30.0000 mg | ORAL_CAPSULE | Freq: Every day | ORAL | Status: DC

## 2017-03-08 MED ORDER — ASPIRIN 81 MG PO CHEW: 81.0000 mg | CHEWABLE_TABLET | Freq: Every morning | ORAL | Status: DC

## 2017-03-08 NOTE — ED Notes (Signed)
Hospitalist came to speak with patient and pt's mother. Pt has signed ama.

## 2017-03-08 NOTE — Telephone Encounter (Signed)
Pt with critical lab result. Hyperkalemia of 6.8, worsening renal function the last few weeks.   Recommend ED eval and treatment.   Called, left vm to call back  Laroy Apple, MD [facility] 03/08/2017, 8:51 AM

## 2017-03-08 NOTE — Progress Notes (Signed)
PHARMACY NOTE:  ANTIMICROBIAL RENAL DOSAGE ADJUSTMENT  Current antimicrobial regimen includes a mismatch between antimicrobial dosage and estimated renal function.  As per policy approved by the Pharmacy & Therapeutics and Medical Executive Committees, the antimicrobial dosage will be adjusted accordingly.  Current antimicrobial dosage:  Triumeq 1 tab daily  Indication: HIV  Renal Function:  Estimated Creatinine Clearance: 33.3 mL/min (A) (by C-G formula based on SCr of 3.1 mg/dL [facility])). []      On intermittent HD, scheduled: []      On CRRT    Antimicrobial dosage has been changed to:  Individual components at same dosages EXCEPT for lamivudine reduced to 150 mg daily (from 300 mg daily)  Additional comments:   Thank you for allowing pharmacy to be a part of this patient's care.  Reuel Boom, PharmD, BCPS [phone] 03/08/2017, 4:25 PM

## 2017-03-08 NOTE — Telephone Encounter (Signed)
Pt is aware and will go to ED.

## 2017-03-08 NOTE — Discharge Summary (Signed)
Physician Discharge Summary  Jarreau Callanan VEH:[id number] DOB: [birth year] DOA: 03/08/2017  PCP: Chipper Herb, MD  Admit date: 03/08/2017 Discharge date: 03/08/2017  Time spent:  minutes  Recommendations for Outpatient Follow-up:   AMA  Discharge Diagnoses:  Active Problems:   Hyperkalemia   Discharge Condition: AMA  Diet recommendation: AMA  There were no vitals filed for this visit.  History of present illness:   47 y.o. male with HIV, DM, HTN, CKD, h/o hypoaldosteronism, presented with abnormal labs, hyperkalemia   Hospital Course:   Hyperkalemia, probable due to underlying AKI/CKD. questionable underlying RTA-IV. H/o hypoaldosteronism. no acute arrhythmias, asymptomatic. received iv insulin, dextrose, calcium. Planned for Kayexalate, recheck labs frequently and nephrology eval -However, he decided to leave AMA. D/w patient, recommended to stay and have treatment for hyperkalmia to prevent arrhythmias, death. He declined and wanted to go home.   AKI on CKD II-III. Possible check UA r/o interstitial nephritis due to recent antibiotic use. Planned for iv fluids, monitor I/o, urine output. -he decided to leave AMA  DM. On insulin pump. Last ha1c-7.5 (01/2017).  HTN. Not at goal. Patient is only on lasix at home.  HIV. Cont home regimen. Last CD4-350 (09/2016)  Patient decided to go home despite recommendations to stay inpatient. D/w his family at the bedside     Procedures:  none (i.e. Studies not automatically included, echos, thoracentesis, etc; not x-rays)  Consultations:  none  Discharge Exam: Vitals:   03/08/17 1432 03/08/17 1630  BP: (!) 150/99 (!) 134/93  Pulse: 86 75  Resp: 19 17  Temp:  97.9 F (36.6 C)  SpO2: 100% 100%    General: alert. No distress  Cardiovascular: AMA Respiratory: AMA  Discharge Instructions    Allergies  Allergen Reactions  . Sulfa Antibiotics Other (See Comments)    High potassium  . Ramipril Cough  . Versed  [Midazolam] Other (See Comments)    "I don't wake up very good or clear it out of my system"      The results of significant diagnostics from this hospitalization (including imaging, microbiology, ancillary and laboratory) are listed below for reference.    Significant Diagnostic Studies: No results found.  Microbiology: No results found for this or any previous visit (from the past 240 hour(s)).   Labs: Basic Metabolic Panel: Recent Labs  Lab 03/07/17 1027  NA 131*  K 6.8*  CL 96  CO2 19*  GLUCOSE 194*  BUN 56*  CREATININE 3.10*  CALCIUM 10.4*   Liver Function Tests: Recent Labs  Lab 03/07/17 1027  AST 23  ALT 23  ALKPHOS 124*  BILITOT 0.5  PROT 7.7  ALBUMIN 4.5   No results for input(s): LIPASE, AMYLASE in the last 168 hours. No results for input(s): AMMONIA in the last 168 hours. CBC: No results for input(s): WBC, NEUTROABS, HGB, HCT, MCV, PLT in the last 168 hours. Cardiac Enzymes: No results for input(s): CKTOTAL, CKMB, CKMBINDEX, TROPONINI in the last 168 hours. BNP: BNP (last 3 results) No results for input(s): BNP in the last 8760 hours.  ProBNP (last 3 results) No results for input(s): PROBNP in the last 8760 hours.  CBG: Recent Labs  Lab 03/08/17 1434  GLUCAP 164*       Signed:  Taliya Mcclard N  [facility] 03/08/2017, 4:39 PM

## 2017-03-08 NOTE — ED Triage Notes (Signed)
Patient here from home with complaints of hyperkalemia. Reports that he had labs drawn and kidney labs were elevated and potassium was 6.8.

## 2017-03-08 NOTE — ED Notes (Signed)
Pt has refused kayexalate and is verbalizing wanting to leave at this time, states he has kayexalate at home and states "I didn't want to come to the hospital anyways." Pt's mother states it has been a long time since they have done anything and that someone told them they would not be getting a bed any time soon. Paging hospitalist to inform of update.

## 2017-03-08 NOTE — H&P (Signed)
[facility] History and Physical  Patrick Brown GNO:[id number] DOB: August 03, 1970 DOA: 03/08/2017  Referring physician:  PCP: Chipper Herb, MD  Specialists:   Chief Complaint: abnormal labs   HPI: Cardell Brown is a 47 y.o. male with HIV, DM, HTN, CKD, h/o hypoaldosteronism, presented with abnormal labs. Patient states that went to his primary care office yesterday for regular follow up. He was called today with abnormal labs and recommended to go to emergency room. His potassium was elevated at 6.8 in the ED. He reports h/o hyperkalemia in the past due to hypoaldosteronism. He was taking fluorocortisol in the past. However, his nephrologist recommended gradually weaned from fludrocortisone few month ago.  Today: he denies any symptoms. No chest pains, no shortness of breath, no dizziness, no palpitations, no nausea, no vomiting no diarrhea. He reports taking augmentin almost for 20 days for sinusitis. He denies further sinus symptoms at this time.  -ED: potasium 6.8. Given insulin, calcium, dextrose,. ED d/w nephrology Dr. Florene Glen. hospitalist is called for admission   Review of Systems: The patient denies anorexia, fever, weight loss,, vision loss, decreased hearing, hoarseness, chest pain, syncope, dyspnea on exertion, peripheral edema, balance deficits, hemoptysis, abdominal pain, melena, hematochezia, severe indigestion/heartburn, hematuria, incontinence, genital sores, muscle weakness, suspicious skin lesions, transient blindness, difficulty walking, depression, unusual weight change, abnormal bleeding, enlarged lymph nodes, angioedema, and breast masses.    Past Medical History:  Diagnosis Date  . Anemia, iron deficiency On procrit  . CAP (community acquired pneumonia)   . CKD (chronic kidney disease) stage 3, GFR 30-59 ml/min (HCC)   . Degenerative arthritis   . Depression   . Dyslipidemia   . Gastroesophageal reflux disease   . Gastroparesis diabeticorum ([place])   . Hematuria,  microscopic 10/09   work up negative (Dr. Amalia Hailey)  . HIV positive ([place])   . 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 Hypothyroidism   . IDDM (insulin dependent diabetes mellitus) ([place])    38 years  . Low HDL (under 40)   . Proteinuria   . Retinopathy    x2  . SIRS (systemic inflammatory response syndrome) (HCC)    Past Surgical History:  Procedure Laterality Date  . CATARACT EXTRACTION Right   . COLONOSCOPY    . ESOPHAGOGASTRODUODENOSCOPY    . EYE SURGERY  2006,2001   x2   . HIP ARTHROPLASTY Right 05/10/2016   Procedure: RIGHT HIP HEMIARTHROPLASTY;  Surgeon: Marchia Bond, MD;  Location: [facility];  Service: Orthopedics;  Laterality: Right;  . insulin pump    . LASIK Bilateral   . VIDEO BRONCHOSCOPY Bilateral 12/15/2012   Procedure: VIDEO BRONCHOSCOPY WITH FLUORO;  Surgeon: Kathee Delton, MD;  Location: [facility];  Service: Cardiopulmonary;  Laterality: Bilateral;  . VITRECTOMY  bilateral   Social History:  reports that  has never smoked. he has never used smokeless tobacco. He reports that he does not drink alcohol or use drugs. Home;  where does patient live--home, ALF, SNF? and with whom if at home? Yes;  Can patient participate in ADLs?  Allergies  Allergen Reactions  . Sulfa Antibiotics Other (See Comments)    High potassium  . Ramipril Cough  . Versed [Midazolam] Other (See Comments)    "I don't wake up very good or clear it out of my system"  Family History  Problem Relation Age of Onset  . Diabetes type I Brother   . Prostate cancer Other   . Dementia Other   . Dementia Other   . Dementia Other     (be sure to complete)  Prior to Admission medications   Medication Sig Start Date End Date Taking? Authorizing Provider   abacavir-dolutegravir-lamiVUDine (TRIUMEQ) 600-50-300 MG tablet Take 1 tablet by mouth daily. 11/06/16   Comer, Okey Regal, MD  acetaminophen (TYLENOL) 500 MG tablet Take 1,000 mg by mouth 2 (two) times daily.     [provider]  acyclovir (ZOVIRAX) 400 MG tablet TAKE 1 TABLET (400 MG TOTAL) BY MOUTH 2 (TWO) TIMES DAILY. 09/02/14   [provider]  Truddie Crumble ULTRA-THIN LANCETS MISC TEST BS 4 TIMES A DAY AND PRN. E10.65 09/23/16   Chipper Herb, MD  amoxicillin-clavulanate (AUGMENTIN) 875-125 MG tablet Take 1 tablet by mouth 2 (two) times daily. 02/28/17   Chipper Herb, MD  aspirin 81 MG chewable tablet Chew 81 mg by mouth every morning.    [provider]  benzonatate (TESSALON) 200 MG capsule TAKE ONE CAPSULE BY MOUTH 3 TIMES A DAY AS NEEDED FOR COUGH 02/28/17   Chipper Herb, MD  cetirizine (ZYRTEC) 10 MG tablet Take 1 tablet (10 mg total) by mouth daily. 11/06/16   Terald Sleeper, PA-C  Continuous Blood Gluc Receiver (FREESTYLE LIBRE READER) DEVI 1 applicator by Does not apply route 4 (four) times daily as needed. 01/03/17   Chipper Herb, MD  Continuous Blood Gluc Sensor (FREESTYLE LIBRE SENSOR SYSTEM) MISC Check BS QID and PRN. 01/03/17   Chipper Herb, MD  cyclobenzaprine (FLEXERIL) 10 MG tablet Take 1 tablet (10 mg total) by mouth 3 (three) times daily as needed for muscle spasms. 01/16/17   Chipper Herb, MD  DULoxetine (CYMBALTA) 60 MG capsule Take 1 capsule (60 mg total) by mouth 2 (two) times daily. As directed 12/03/16   Chipper Herb, MD  esomeprazole (NEXIUM) 40 MG capsule Take 1 capsule (40 mg total) by mouth daily. 12/06/16   Chipper Herb, MD  ferrous sulfate 325 (65 FE) MG tablet Take 650 mg by mouth daily with breakfast.     [provider]  fludrocortisone (FLORINEF) 0.1 MG tablet Take 0.1 mg by mouth daily.     [provider]  fluocinonide cream (LIDEX) 7.16 % Apply 1 application topically 2 (two) times daily.    [provider]  fluticasone (FLONASE) 50 MCG/ACT nasal spray Place 2 sprays into both nostrils daily. 11/06/16   Terald Sleeper, PA-C  furosemide (LASIX) 40 MG tablet Take 20 mg by mouth 2 (two) times daily. 11/07/14   [provider]  Glucosamine-Chondroit-Vit C-Mn (GLUCOSAMINE 1500 COMPLEX PO) Take 1 tablet by mouth 3 (three) times daily.     [provider]  HYDROcodone-homatropine (HYCODAN) 5-1.5 MG/5ML syrup TAKE 5 ML BY MOUTH EVERY 4-6 HOURS AS NEEDED FOR COUGH. MAX OF 30 ML PER 24 HOURS 02/22/17   [provider]  insulin lispro (HUMALOG) 100 UNIT/ML injection Use 42units to 120 units PER PUMP daily  AS DIRECTED 07/09/16   Chipper Herb, MD  levothyroxine (SYNTHROID, LEVOTHROID) 175 MCG tablet TAKE 1 TABLET (175 MCG TOTAL) BY MOUTH DAILY BEFORE BREAKFAST. 09/09/16   Chipper Herb, MD  LORazepam (ATIVAN) 0.5 MG tablet TAKE 1 TABLET BY MOUTH EVERY DAY AS NEEDED 02/06/17   Chevis Pretty, FNP  metoCLOPramide (REGLAN) 5 MG  tablet TAKE 1 TABLET BY MOUTH 3 TIMES A DAY BEFORE MEALS 12/03/16   Chipper Herb, MD  niacin (NIASPAN) 1000 MG CR tablet TAKE 1 TABLET (1,000 MG TOTAL) BY MOUTH AT BEDTIME. Patient taking differently: TAKE 1 TABLET (1,500 MG TOTAL) BY MOUTH AT BEDTIME. 05/22/15   Chipper Herb, MD  omega-3 acid ethyl esters (LOVAZA) 1 g capsule TAKE 2 CAPSULES (2 G TOTAL) BY MOUTH 2 (TWO) TIMES DAILY. 10/28/16   Chipper Herb, MD  ondansetron (ZOFRAN) 4 MG tablet Take 1 tablet (4 mg total) by mouth every 8 (eight) hours as needed for nausea. 05/14/16   Dhungel, Flonnie Overman, MD  ONETOUCH VERIO test strip TEST BLOOD SUGAR 4 TIMES DAILY AND AS NEEDED 02/24/17   Chipper Herb, MD  OVER THE COUNTER MEDICATION Take 1 capsule by mouth daily. Phillips- probiotic daily    [provider]  simvastatin (ZOCOR) 40 MG tablet TAKE 1 TABLET (40 MG TOTAL) BY MOUTH AT BEDTIME. 01/22/17   Chipper Herb, MD  Testosterone 20.25 MG/ACT (1.62%) GEL APPLY 2 PUMPS DAILY AS  DIRECTED 02/10/17   Hassell Done, Mary-Margaret, FNP  traMADol (ULTRAM) 50 MG tablet Take 1 tablet (50 mg total) by mouth every 12 (twelve) hours as needed. for pain 09/02/16   Chipper Herb, MD  ULORIC 40 MG tablet TAKE 1 TABLET BY MOUTH EVERY DAY 12/13/16   Chipper Herb, MD   Physical Exam: Vitals:   03/08/17 1345 03/08/17 1432  BP:  (!) 150/99  Pulse: 74 86  Resp: 11 19  Temp:    SpO2: 99% 100%     General:  Alert. No distress.   Eyes: eom-i  ENT: no ulcer   Neck: supple   Cardiovascular: s11,s2 rrr  Respiratory: CTA BL  Abdomen: soft, nt, nd   Skin: no rash   Musculoskeletal: no leg edema   Psychiatric: no hallucinations   Neurologic: cn 2-12 intact   Labs on Admission:  Basic Metabolic Panel: Recent Labs  Lab 03/07/17 1027  NA 131*  K 6.8*  CL 96  CO2 19*  GLUCOSE 194*  BUN 56*  CREATININE 3.10*  CALCIUM 10.4*   Liver Function Tests: Recent Labs  Lab 03/07/17 1027  AST 23  ALT 23  ALKPHOS 124*  BILITOT 0.5  PROT 7.7  ALBUMIN 4.5   No results for input(s): LIPASE, AMYLASE in the last 168 hours. No results for input(s): AMMONIA in the last 168 hours. CBC: No results for input(s): WBC, NEUTROABS, HGB, HCT, MCV, PLT in the last 168 hours. Cardiac Enzymes: No results for input(s): CKTOTAL, CKMB, CKMBINDEX, TROPONINI in the last 168 hours.  BNP (last 3 results) No results for input(s): BNP in the last 8760 hours.  ProBNP (last 3 results) No results for input(s): PROBNP in the last 8760 hours.  CBG: Recent Labs  Lab 03/08/17 1434  GLUCAP 164*    Radiological Exams on Admission: No results found.  EKG: Independently reviewed.   Assessment/Plan Active Problems:   Hyperkalemia  47 y.o. male with HIV, DM, HTN, CKD, h/o hypoaldosteronism, presented with abnormal labs, hyperkalemia   Hyperkalemia, probable due to underlying AKI/CKD. questionable underlying RTA-IV. H/o hypoaldosteronism.  -no acute arrhythmias, asymptomatic. received iv  insulin, dextrose, calcium. Kayexalate, recheck labs frequently, will repeat hyperkalemia treatment as needed. ED d/w nephrology. Dr. Florene Glen who kindly agreed to follow up   AKI on CKD II-III. Possible check UA r/o interstitial nephritis due to recent antibiotic use. Cot iv fluids, monitor I/o,  urine output. Patient is not oliguric   DM. On insulin pump. Last ha1c-7.5 (01/2017). Cont monitor CBG while on insulin pump   HTN. Not at goal. Patient is only on lasix at home. Will monitor, and consider starting oral antihypertensive as needed  HIV. Cont home regimen. Last CD4-350 (09/2016)   Nephrology per ED.  if consultant consulted, please document name and whether formally or informally consulted  Code Status: fuill (must indicate code status--if unknown or must be presumed, indicate so) Family Communication: d/w patient, his family. ED/MD (indicate person spoken with, if applicable, with phone number if by telephone) Disposition Plan: home 24-48 hrs  (indicate anticipated LOS)  Time spent: >45 minutes   Kinnie Feil [facility] Pager [phone]  If 7PM-7AM, please contact night-coverage [email or web address] Password [facility] 03/08/2017, 3:01 PM

## 2017-03-08 NOTE — ED Provider Notes (Signed)
[place] DEPT Provider Note   CSN: [id number] Arrival date & time: 03/08/17  1147     History   Chief Complaint Chief Complaint  Patient presents with  . Abnormal Lab    HPI Patrick Brown is a 47 y.o. male.  HPI   Has been fatigued, sinus infection, but no other significant symptoms On florinef for hyperaldosteronism, blood pressure was increasing, cut out florinef because blood pressure was increasing around 1/1, then on 1/18 this month, was going for part time EMS interview and had near-syncope. Had that in the past with high potassium.  Potassium 4.7 on 1/18, liver and Cr numbers elevated (liver numbers 46, 49) Went to see PCP last Friday, then ordered labs to recheck numbers.   Has been taking 4g of tylenol per day, but decreased recently. Not taking ibuprofen.   No vomiting, no diarrhea Urinating normally, taking 40 lasix per day. No problems starting stream or problems urinating. No change in appetite or decreased eating.  Dr. Linus Salmons had changed HIV medication several months ago--not sure if it is what is causing renal disease   Saw Dr. Janeece Fitting seeing Nephrology group in [place] Was on augmentin for sinus disease, toot tessalon, cough syrup with codeine or hydrocodone, had hurricane spray    Past Medical History:  Diagnosis Date  . Anemia, iron deficiency On procrit  . CAP (community acquired pneumonia)   . CKD (chronic kidney disease) stage 3, GFR 30-59 ml/min (HCC)   . Degenerative arthritis   . Depression   . Dyslipidemia   . Gastroesophageal reflux disease   . Gastroparesis diabeticorum ([place])   . Hematuria, microscopic 10/09   work up negative (Dr. Amalia Hailey)  . HIV positive ([place])   . Hyperkalemia, diminished renal excretion 06/2011 secondary to TMP/SMZ; prior secondary to  ARBS;    Known potassium excretory defect; history of recurrent hyperkalemia due to diabetic renal disease; ACE/ARB contraindicated; hyperkalemia 06/2011  secondary to TMP-SMZ  . Hypothyroidism   . IDDM (insulin dependent diabetes mellitus) ([place])    38 years  . Low HDL (under 40)   . Proteinuria   . Retinopathy    x2  . SIRS (systemic inflammatory response syndrome) [facility])     Patient Active Problem List   Diagnosis Date Noted  . Hyperkalemia 03/08/2017  . Non-seasonal allergic rhinitis due to pollen 11/06/2016  . S/P ORIF (open reduction internal fixation) fracture   . Closed right hip fracture, initial encounter ([place]) 05/09/2016  . Benign prostatic hyperplasia without lower urinary tract symptoms 04/18/2016  . Vitamin D deficiency 04/18/2016  . OSA (obstructive sleep apnea) 10/06/2014  . Screening examination for venereal disease 12/23/2013  . Pseudophakia of both eyes 06/23/2013  . Pain in joint, lower leg 11/19/2012  . Unspecified sinusitis (chronic) 09/16/2012  . Right groin wound 08/09/2012  . HIV disease ([place]) 06/02/2012  . Convulsions/seizures ([place]) 05/16/2012  . Pulmonary infiltrate 04/18/2012  . Gastroparesis 04/18/2012  . Anemia, iron deficiency   . Type 1 diabetes mellitus ([place]) 05/08/2011  . Acquired hypothyroidism 05/08/2011  . Chronic kidney disease, stage 3, mod decreased GFR ([place]) 12/31/2010    Past Surgical History:  Procedure Laterality Date  . CATARACT EXTRACTION Right   . COLONOSCOPY    . ESOPHAGOGASTRODUODENOSCOPY    . EYE SURGERY  2006,2001   x2   . HIP ARTHROPLASTY Right 05/10/2016   Procedure: RIGHT HIP HEMIARTHROPLASTY;  Surgeon: Marchia Bond, MD;  Location: [facility];  Service: Orthopedics;  Laterality: Right;  .  insulin pump    . LASIK Bilateral   . VIDEO BRONCHOSCOPY Bilateral 12/15/2012   Procedure: VIDEO BRONCHOSCOPY WITH FLUORO;  Surgeon: Kathee Delton, MD;  Location: [facility];  Service: Cardiopulmonary;  Laterality: Bilateral;  . VITRECTOMY  bilateral       Home Medications    Prior to Admission medications   Medication Sig Start Date End Date Taking? Authorizing Provider    abacavir-dolutegravir-lamiVUDine (TRIUMEQ) 600-50-300 MG tablet Take 1 tablet by mouth daily. 11/06/16  Yes Comer, Okey Regal, MD  acetaminophen (TYLENOL) 500 MG tablet Take 1,000 mg by mouth 2 (two) times daily.    Yes [provider]  acyclovir (ZOVIRAX) 400 MG tablet TAKE 1 TABLET (400 MG TOTAL) BY MOUTH 2 (TWO) TIMES DAILY. 09/02/14  Yes [provider]  Leandra Kern LANCETS MISC TEST BS 4 TIMES A DAY AND PRN. E10.65 09/23/16  Yes Chipper Herb, MD  amoxicillin-clavulanate (AUGMENTIN) 875-125 MG tablet Take 1 tablet by mouth 2 (two) times daily. 02/28/17  Yes Chipper Herb, MD  aspirin 81 MG chewable tablet Chew 81 mg by mouth every morning.   Yes [provider]  cetirizine (ZYRTEC) 10 MG tablet Take 1 tablet (10 mg total) by mouth daily. 11/06/16  Yes Terald Sleeper, PA-C  DULoxetine (CYMBALTA) 30 MG capsule Take 30 mg by mouth at bedtime.   Yes [provider]  esomeprazole (NEXIUM) 40 MG capsule Take 1 capsule (40 mg total) by mouth daily. 12/06/16  Yes Chipper Herb, MD  ferrous sulfate 325 (65 FE) MG tablet Take 650 mg by mouth daily with breakfast.    Yes [provider]  furosemide (LASIX) 40 MG tablet Take 20 mg by mouth 2 (two) times daily. 11/07/14  Yes [provider]  Glucosamine-Chondroit-Vit C-Mn (GLUCOSAMINE 1500 COMPLEX PO) Take 1 tablet by mouth 2 (two) times daily.    Yes [provider]  HYDROcodone-homatropine (HYCODAN) 5-1.5 MG/5ML syrup TAKE 5 ML BY MOUTH EVERY 4-6 HOURS AS NEEDED FOR COUGH. MAX OF 30 ML PER 24 HOURS 02/22/17  Yes [provider]  insulin aspart (NOVOLOG) 100 UNIT/ML injection as directed. Per insulin pump. Basal rate is 36-38 units with bolus insulin per carbohydrate intake   Yes [provider]  levothyroxine (SYNTHROID, LEVOTHROID) 175 MCG tablet TAKE 1 TABLET (175 MCG TOTAL) BY MOUTH DAILY BEFORE BREAKFAST. 09/09/16  Yes Chipper Herb, MD  LORazepam (ATIVAN) 0.5 MG  tablet TAKE 1 TABLET BY MOUTH EVERY DAY AS NEEDED Patient taking differently: TAKE 1/2 TABLET BY MOUTH EVERY NIGHT AT BEDTIME AS NEEDED FOR SLEEP 02/06/17  Yes Hassell Done, Mary-Margaret, FNP  metoCLOPramide (REGLAN) 5 MG tablet TAKE 1 TABLET BY MOUTH 3 TIMES A DAY BEFORE MEALS Patient taking differently: Take 5 mg by mouth 2 (two) times daily.  12/03/16  Yes Chipper Herb, MD  niacin (NIASPAN) 1000 MG CR tablet TAKE 1 TABLET (1,000 MG TOTAL) BY MOUTH AT BEDTIME. Patient taking differently: TAKE 1 TABLET (1,500 MG TOTAL) BY MOUTH AT BEDTIME. 05/22/15  Yes Chipper Herb, MD  omega-3 acid ethyl esters (LOVAZA) 1 g capsule TAKE 2 CAPSULES (2 G TOTAL) BY MOUTH 2 (TWO) TIMES DAILY. 10/28/16  Yes Chipper Herb, MD  [facility] VERIO test strip TEST BLOOD SUGAR 4 TIMES DAILY AND AS NEEDED 02/24/17  Yes Chipper Herb, MD  OVER THE COUNTER MEDICATION Take 1 capsule by mouth daily. [name]- probiotic daily   Yes [provider]  Testosterone 20.25 MG/ACT (1.62%) GEL  APPLY 2 PUMPS DAILY AS DIRECTED Patient taking differently: APPLY 3 PUMPS DAILY AS DIRECTED 02/10/17  Yes Hassell Done, Mary-Margaret, FNP  ULORIC 40 MG tablet TAKE 1 TABLET BY MOUTH EVERY DAY 12/13/16  Yes Chipper Herb, MD  benzonatate (TESSALON) 200 MG capsule TAKE ONE CAPSULE BY MOUTH 3 TIMES A DAY AS NEEDED FOR COUGH Patient not taking: Reported on 03/08/2017 02/28/17   Chipper Herb, MD  Continuous Blood Gluc Receiver (FREESTYLE LIBRE READER) DEVI 1 applicator by Does not apply route 4 (four) times daily as needed. 01/03/17   Chipper Herb, MD  Continuous Blood Gluc Sensor (FREESTYLE LIBRE SENSOR SYSTEM) MISC Check BS QID and PRN. 01/03/17   Chipper Herb, MD  cyclobenzaprine (FLEXERIL) 10 MG tablet Take 1 tablet (10 mg total) by mouth 3 (three) times daily as needed for muscle spasms. Patient not taking: Reported on 03/08/2017 01/16/17   Chipper Herb, MD  DULoxetine (CYMBALTA) 60 MG capsule Take 1 capsule (60 mg total) by mouth 2 (two)  times daily. As directed Patient not taking: Reported on 03/08/2017 12/03/16   Chipper Herb, MD  fluticasone [facility]) 50 MCG/ACT nasal spray Place 2 sprays into both nostrils daily. Patient not taking: Reported on 03/08/2017 11/06/16   Terald Sleeper, PA-C  insulin lispro (HUMALOG) 100 UNIT/ML injection Use 42units to 120 units PER PUMP daily  AS DIRECTED Patient not taking: Reported on 03/08/2017 07/09/16   Chipper Herb, MD  ondansetron (ZOFRAN) 4 MG tablet Take 1 tablet (4 mg total) by mouth every 8 (eight) hours as needed for nausea. Patient not taking: Reported on 03/08/2017 05/14/16   Dhungel, Flonnie Overman, MD  simvastatin (ZOCOR) 40 MG tablet TAKE 1 TABLET (40 MG TOTAL) BY MOUTH AT BEDTIME. 01/22/17   Chipper Herb, MD  traMADol (ULTRAM) 50 MG tablet Take 1 tablet (50 mg total) by mouth every 12 (twelve) hours as needed. for pain Patient not taking: Reported on 03/08/2017 09/02/16   Chipper Herb, MD    Family History Family History  Problem Relation Age of Onset  . Diabetes type I Brother   . Prostate cancer Other   . Dementia Other   . Dementia Other   . Dementia Other     Social History Social History   Tobacco Use  . Smoking status: Never Smoker  . Smokeless tobacco: Never Used  Substance Use Topics  . Alcohol use: No    Alcohol/week: 0.0 oz    Comment: Infrequent, less than 1 x per month.  . Drug use: No     Allergies   Sulfa antibiotics; Ramipril; and Versed [midazolam]   Review of Systems Review of Systems  Constitutional: Positive for fatigue. Negative for fever.  HENT: Negative for sore throat. Congestion: improved.   Eyes: Negative for visual disturbance.  Respiratory: Negative for shortness of breath.   Cardiovascular: Negative for chest pain.  Gastrointestinal: Negative for abdominal pain, nausea and vomiting.  Genitourinary: Negative for difficulty urinating and dysuria.  Musculoskeletal: Negative for back pain and neck stiffness.  Skin: Negative for rash.   Neurological: Negative for syncope and headaches.     Physical Exam Updated Vital Signs BP (!) 134/93 (BP Location: Left Arm)   Pulse 75   Temp 97.9 F (36.6 C) (Oral)   Resp 17   SpO2 100%   Physical Exam  Constitutional: He is oriented to person, place, and time. He appears well-developed and well-nourished. No distress.  HENT:  Head: Normocephalic and atraumatic.  Eyes: Conjunctivae and EOM are normal.  Neck: Normal range of motion.  Cardiovascular: Normal rate, regular rhythm, normal heart sounds and intact distal pulses. Exam reveals no gallop and no friction rub.  No murmur heard. Pulmonary/Chest: Effort normal and breath sounds normal. No respiratory distress. He has no wheezes. He has no rales.  Abdominal: Soft. He exhibits no distension. There is no tenderness. There is no guarding.  Musculoskeletal: He exhibits no edema.  Neurological: He is alert and oriented to person, place, and time.  Skin: Skin is warm and dry. He is not diaphoretic.  Nursing note and vitals reviewed.    ED Treatments / Results  Labs (all labs ordered are listed, but only abnormal results are displayed) Labs Reviewed  URINALYSIS, ROUTINE W REFLEX MICROSCOPIC - Abnormal; Notable for the following components:      Result Value   Glucose, UA 50 (*)    All other components within normal limits  CBG MONITORING, ED - Abnormal; Notable for the following components:   Glucose-Capillary 164 (*)    All other components within normal limits  CREATININE, URINE, RANDOM  UREA NITROGEN, URINE  BASIC METABOLIC PANEL  BASIC METABOLIC PANEL  URINALYSIS, COMPLETE (UACMP) WITH MICROSCOPIC    EKG  EKG Interpretation  Date/Time:  Saturday March 08 2017 12:33:12 EST Ventricular Rate:  93 PR Interval:    QRS Duration: 95 QT Interval:  327 QTC Calculation: 407 R Axis:   162 Text Interpretation:  Sinus rhythm Right axis deviation Consider anterior infarct TW voltage similar to QRS however do not  appear significantly peaked nonspecific changes Confirmed by Gareth Morgan [phone]) on 03/08/2017 5:36:23 PM       Radiology No results found.  Procedures .Critical Care Performed by: Gareth Morgan, MD Authorized by: Gareth Morgan, MD   Comments:     CRITICAL CARE: hyperkalemia Performed by: Tennis Must   Total critical care time: 30 minutes  Critical care time was exclusive of separately billable procedures and treating other patients.  Critical care was necessary to treat or prevent imminent or life-threatening deterioration.  Critical care was time spent personally by me on the following activities: development of treatment plan with patient and/or surrogate as well as nursing, discussions with consultants, evaluation of patient's response to treatment, examination of patient, obtaining history from patient or surrogate, ordering and performing treatments and interventions, ordering and review of laboratory studies, ordering and review of radiographic studies, pulse oximetry and re-evaluation of patient's condition.    (including critical care time)  Medications Ordered in ED Medications  sodium polystyrene (KAYEXALATE) 15 GM/60ML suspension 15 g (15 g Oral Refused 03/08/17 1630)  acetaminophen (TYLENOL) tablet 650 mg (not administered)  acyclovir (ZOVIRAX) tablet 400 mg (not administered)  aspirin chewable tablet 81 mg (not administered)  loratadine (CLARITIN) tablet 10 mg (not administered)  DULoxetine (CYMBALTA) DR capsule 30 mg (not administered)  pantoprazole (PROTONIX) EC tablet 40 mg (not administered)  ferrous sulfate tablet 650 mg (not administered)  furosemide (LASIX) tablet 20 mg (not administered)  HYDROcodone-homatropine (HYCODAN) 5-1.5 MG/5ML syrup 5 mL (not administered)  insulin aspart (novoLOG) injection 36-38 Units (not administered)  levothyroxine (SYNTHROID, LEVOTHROID) tablet 175 mcg (not administered)  LORazepam (ATIVAN) tablet 0.5 mg (not  administered)  metoCLOPramide (REGLAN) tablet 5 mg (not administered)  niacin (NIASPAN) CR tablet 1,000 mg (not administered)  omega-3 acid ethyl esters (LOVAZA) capsule 1 g (not administered)  glucose blood test strip STRP 1 each (not administered)  simvastatin (ZOCOR) tablet 20 mg (  not administered)  febuxostat (ULORIC) tablet 40 mg (not administered)  heparin injection 5,000 Units (not administered)  0.9 %  sodium chloride infusion (not administered)  hydrALAZINE (APRESOLINE) injection 10 mg (not administered)  abacavir (ZIAGEN) tablet 600 mg (not administered)    And  dolutegravir (TIVICAY) tablet 50 mg (not administered)    And  lamiVUDine (EPIVIR) tablet 150 mg (not administered)  sodium chloride 0.9 % bolus 1,000 mL (0 mLs Intravenous Stopped 03/08/17 1718)  calcium gluconate 1 g in sodium chloride 0.9 % 100 mL IVPB (0 g Intravenous Stopped 03/08/17 1432)  insulin aspart (novoLOG) injection 5 Units (5 Units Intravenous Given 03/08/17 1343)  dextrose 50 % solution 50 mL (50 mLs Intravenous Given 03/08/17 1329)     Initial Impression / Assessment and Plan / ED Course  I have reviewed the triage vital signs and the nursing notes.  Pertinent labs & imaging results that were available during my care of the patient were reviewed by me and considered in my medical decision making (see chart for details).     47 year old male with history of dyslipidemia, CKD, HIV, hyperaldosteronism, diabetes, presents with concern for hyper kalemia found on outpatient labs.  Labs show increase in creatinine since December.  Potassium is 6.8.  Similar voltage of T waves to QRS complex, calcium given, however he overall does not have hyperkalemic changes.  He was given insulin, dextrose, and fluids.  Discussed with Dr. Florene Glen of nephrology.  Ordered urine studies.  Unclear etiology of his acute on chronic kidney disease by history.  He denies dehydration, difficulty urinating, or NSAID use.  Suspects possible  other etiology related to medications, acute interstitial nephritis or other.  Will have nephrology consult.    Admitted to hospitalist for further care.   Final Clinical Impressions(s) / ED Diagnoses   Final diagnoses:  Hyperkalemia  Acute renal failure superimposed on chronic kidney disease, unspecified CKD stage, unspecified acute renal failure type [facility])    ED Discharge Orders    None       Gareth Morgan, MD 03/08/17 1740

## 2017-03-10 ENCOUNTER — Telehealth: Payer: Self-pay | Admitting: Family Medicine

## 2017-03-10 LAB — UREA NITROGEN, URINE: UREA NITROGEN UR: 260 mg/dL

## 2017-03-10 NOTE — Telephone Encounter (Signed)
Pt notified ok to come in for repeat lab

## 2017-03-11 ENCOUNTER — Other Ambulatory Visit: Payer: Medicare Other

## 2017-03-11 ENCOUNTER — Ambulatory Visit (HOSPITAL_COMMUNITY): Payer: Medicare Other

## 2017-03-11 DIAGNOSIS — E875 Hyperkalemia: Secondary | ICD-10-CM

## 2017-03-11 LAB — BMP8+EGFR
BUN/Creatinine Ratio: 16 (ref 9–20)
BUN: 44 mg/dL — ABNORMAL HIGH (ref 6–24)
CALCIUM: 9.4 mg/dL (ref 8.7–10.2)
CO2: 23 mmol/L (ref 20–29)
Chloride: 97 mmol/L (ref 96–106)
Creatinine, Ser: 2.69 mg/dL — ABNORMAL HIGH (ref 0.76–1.27)
GFR calc Af Amer: 31 mL/min/{1.73_m2} — ABNORMAL LOW (ref >59)
GFR calc non Af Amer: 27 mL/min/{1.73_m2} — ABNORMAL LOW (ref >59)
GLUCOSE: 250 mg/dL — AB (ref 65–99)
POTASSIUM: 4.3 mmol/L (ref 3.5–5.2)
Sodium: 133 mmol/L — ABNORMAL LOW (ref 134–144)

## 2017-03-21 ENCOUNTER — Other Ambulatory Visit: Payer: Medicare Other

## 2017-03-21 DIAGNOSIS — N183 Chronic kidney disease, stage 3 unspecified: Secondary | ICD-10-CM

## 2017-03-22 LAB — BMP8+EGFR
BUN/Creatinine Ratio: 20 (ref 9–20)
BUN: 46 mg/dL — ABNORMAL HIGH (ref 6–24)
CALCIUM: 9.5 mg/dL (ref 8.7–10.2)
CO2: 17 mmol/L — AB (ref 20–29)
Chloride: 103 mmol/L (ref 96–106)
Creatinine, Ser: 2.29 mg/dL — ABNORMAL HIGH (ref 0.76–1.27)
GFR calc Af Amer: 38 mL/min/{1.73_m2} — ABNORMAL LOW (ref >59)
GFR, EST NON AFRICAN AMERICAN: 33 mL/min/{1.73_m2} — AB (ref >59)
Glucose: 139 mg/dL — ABNORMAL HIGH (ref 65–99)
POTASSIUM: 4.4 mmol/L (ref 3.5–5.2)
Sodium: 138 mmol/L (ref 134–144)

## 2017-03-26 ENCOUNTER — Ambulatory Visit: Payer: Self-pay | Admitting: Pulmonary Disease

## 2017-03-26 ENCOUNTER — Other Ambulatory Visit: Payer: Self-pay | Admitting: Family Medicine

## 2017-03-27 ENCOUNTER — Ambulatory Visit: Payer: Self-pay | Admitting: Internal Medicine

## 2017-04-01 ENCOUNTER — Other Ambulatory Visit: Payer: Self-pay | Admitting: Nurse Practitioner

## 2017-04-01 NOTE — Telephone Encounter (Signed)
Last seen 02/28/17  DWM

## 2017-04-02 ENCOUNTER — Ambulatory Visit (INDEPENDENT_AMBULATORY_CARE_PROVIDER_SITE_OTHER): Payer: Medicare Other | Admitting: Pediatrics

## 2017-04-02 ENCOUNTER — Encounter: Payer: Self-pay | Admitting: Pediatrics

## 2017-04-02 ENCOUNTER — Other Ambulatory Visit: Payer: Self-pay | Admitting: Family Medicine

## 2017-04-02 VITALS — BP 121/82 | HR 92 | Temp 98.2°F | Ht 70.0 in | Wt 210.0 lb

## 2017-04-02 DIAGNOSIS — J069 Acute upper respiratory infection, unspecified: Secondary | ICD-10-CM

## 2017-04-02 DIAGNOSIS — J0110 Acute frontal sinusitis, unspecified: Secondary | ICD-10-CM

## 2017-04-02 MED ORDER — OMEGA-3-ACID ETHYL ESTERS 1 G PO CAPS: ORAL_CAPSULE | ORAL | 1 refills | Status: DC

## 2017-04-02 NOTE — Progress Notes (Signed)
  Subjective:   Patient ID: Patrick Brown, male    DOB: Jul 15, 1970, 47 y.o.   MRN: [id number] CC: Nasal Congestion and Cough  HPI: Patrick Brown is a 47 y.o. male presenting for Nasal Congestion and Cough  Nasal congestion for the past week. "sniffles and cough" bothering him the most. Felt better 3-4 days ago, symptoms returned yesterday, slightly better again today. BGLs into the 300s yesterday. Today BGL 77 this AM, 234 at lunch (didn't bolus with breakfast).  Says he often gets similar sinus symptoms this time of year. Has been using nasocort and anthistamine daily. Has had some clear drainage. No facial pain or pressure, no ear pain, some sore throat a few days ago. Dry cough. Took 2g of amox this morning as ppx given hip replacement for a dental procedure. Not used sinus rinses in the past.  No SOB, no CP, no trouble breathing, no change in exercise tolerance, no fevers. No headaches.   Took 10 days of augmentin for sinusitis starting 4 weeks ago.   Relevant past medical, surgical, family and social history reviewed. Allergies and medications reviewed and updated. Social History   Tobacco Use  Smoking Status Never Smoker  Smokeless Tobacco Never Used   ROS: Per HPI   Objective:    BP 121/82   Pulse 92   Temp 98.2 F (36.8 C) (Oral)   Ht 5\' 10"  (1.778 m)   Wt 210 lb (95.3 kg)   BMI 30.13 kg/m   Wt Readings from Last 3 Encounters:  04/02/17 210 lb (95.3 kg)  02/28/17 204 lb 9.6 oz (92.8 kg)  02/21/17 205 lb (93 kg)    Gen: NAD, alert, cooperative with exam, NCAT, slightly congested EYES: EOMI, no conjunctival injection, or no icterus ENT:  TMs pearly gray b/l, OP without erythema, no ttp over max or frontal sinuses LYMPH: no cervical LAD CV: NRRR, normal S1/S2, no murmur, distal pulses 2+ b/l Resp: CTABL, no wheezes, normal WOB Abd: +BS, soft, NTND. Ext: No edema, warm Neuro: Alert and oriented, strength equal b/l UE and LE, coordination grossly normal MSK: normal  muscle bulk  Assessment & Plan:  Patrick Brown was seen today for nasal congestion and cough.  Diagnoses and all orders for this visit:  Acute URI Discussed symptom care, return precautions  Follow up plan: Return if symptoms worsen or fail to improve. Assunta Found, MD [place]

## 2017-04-02 NOTE — Addendum Note (Signed)
Addended by: Wardell Heath on: 04/02/2017 04:52 PM   Modules accepted: Orders

## 2017-04-02 NOTE — Patient Instructions (Signed)
Fever reducer and headache: tylenol   Sinus pressure:  Nasal steroid such as flonase/fluticaone or nasocort daily Can also take daily antihistamine such as loratadine/claritin or cetirizine/zyrtec  Sinus rinses/irritation: Netipot or similar with distilled water 2-3 times a day to clear out sinuses or Normal saline nasal spray  Sore throat:  Throat lozenges chloroseptic spray  Stick with bland foods Drink lots of fluids  

## 2017-04-03 NOTE — Telephone Encounter (Signed)
Seen yesterday Dr Ananias Pilgrim  PCP

## 2017-04-09 DIAGNOSIS — E109 Type 1 diabetes mellitus without complications: Secondary | ICD-10-CM | POA: Diagnosis not present

## 2017-04-15 DIAGNOSIS — R0902 Hypoxemia: Secondary | ICD-10-CM | POA: Diagnosis not present

## 2017-04-15 DIAGNOSIS — G4733 Obstructive sleep apnea (adult) (pediatric): Secondary | ICD-10-CM | POA: Diagnosis not present

## 2017-04-15 DIAGNOSIS — J9690 Respiratory failure, unspecified, unspecified whether with hypoxia or hypercapnia: Secondary | ICD-10-CM | POA: Diagnosis not present

## 2017-04-22 ENCOUNTER — Telehealth: Payer: Self-pay | Admitting: Family Medicine

## 2017-04-22 NOTE — Telephone Encounter (Signed)
Patrick Brown would like to come in early tomorrow for his labwork.  Would you please put an order in for him to do this.

## 2017-04-22 NOTE — Telephone Encounter (Signed)
Aware.Message left for provider's nurse to order labs.

## 2017-04-23 ENCOUNTER — Encounter: Payer: Self-pay | Admitting: Family Medicine

## 2017-04-23 ENCOUNTER — Ambulatory Visit (INDEPENDENT_AMBULATORY_CARE_PROVIDER_SITE_OTHER): Payer: Medicare Other | Admitting: Family Medicine

## 2017-04-23 ENCOUNTER — Telehealth: Payer: Self-pay | Admitting: Family Medicine

## 2017-04-23 VITALS — BP 147/86 | HR 79 | Temp 97.0°F | Ht 70.0 in | Wt 216.0 lb

## 2017-04-23 DIAGNOSIS — N183 Chronic kidney disease, stage 3 unspecified: Secondary | ICD-10-CM

## 2017-04-23 DIAGNOSIS — I10 Essential (primary) hypertension: Secondary | ICD-10-CM

## 2017-04-23 DIAGNOSIS — E559 Vitamin D deficiency, unspecified: Secondary | ICD-10-CM

## 2017-04-23 DIAGNOSIS — D509 Iron deficiency anemia, unspecified: Secondary | ICD-10-CM | POA: Diagnosis not present

## 2017-04-23 DIAGNOSIS — E1065 Type 1 diabetes mellitus with hyperglycemia: Secondary | ICD-10-CM | POA: Diagnosis not present

## 2017-04-23 DIAGNOSIS — E349 Endocrine disorder, unspecified: Secondary | ICD-10-CM

## 2017-04-23 DIAGNOSIS — E7800 Pure hypercholesterolemia, unspecified: Secondary | ICD-10-CM | POA: Diagnosis not present

## 2017-04-23 DIAGNOSIS — N400 Enlarged prostate without lower urinary tract symptoms: Secondary | ICD-10-CM

## 2017-04-23 DIAGNOSIS — E1022 Type 1 diabetes mellitus with diabetic chronic kidney disease: Secondary | ICD-10-CM

## 2017-04-23 DIAGNOSIS — Z8739 Personal history of other diseases of the musculoskeletal system and connective tissue: Secondary | ICD-10-CM

## 2017-04-23 DIAGNOSIS — IMO0002 Reserved for concepts with insufficient information to code with codable children: Secondary | ICD-10-CM

## 2017-04-23 LAB — BAYER DCA HB A1C WAIVED: HB A1C: 7.5 % — AB (ref <7.0)

## 2017-04-23 MED ORDER — LORAZEPAM 0.5 MG PO TABS: 0.5000 mg | ORAL_TABLET | Freq: Every day | ORAL | 5 refills | Status: DC | PRN

## 2017-04-23 MED ORDER — GLUCOSE BLOOD VI STRP: ORAL_STRIP | 11 refills | Status: DC

## 2017-04-23 MED ORDER — TESTOSTERONE 20.25 MG/ACT (1.62%) TD GEL: TRANSDERMAL | 3 refills | Status: DC

## 2017-04-23 NOTE — Telephone Encounter (Signed)
Please sign for correct pharm  - needed to go to [facility] instead of [facility]

## 2017-04-23 NOTE — Progress Notes (Signed)
Subjective:    Patient ID: Patrick Brown, male    DOB: 13-Jan-1971, 47 y.o.   MRN: [id number]  HPI  Pt here for follow up and management of chronic medical problems which includes hyperlipidemia and diabetes. He is taking medication regularly.  The patient has diabetes insulin-dependent and has had recent problems with hyper kalemia.  He is currently not taking any vitamin D and the nephrologist wants [place] to address this.  The last BMP that we have on file had a creatinine that was 2.29 with a normal potassium at 4.4.  Blood sugar was 139.  The last vitamin D level in December was 20.8.  We mentioned at that time that we would not address this and would wait for the nephrologist to address this.  The patient is feeling well and looking good today.  He is concerned about the vitamin D.  We will repeat the vitamin D level now is he is currently not taking any vitamin D and we will discussed with the nephrologist, Dr. Antionette Fairy about what he would like [place] to do and how much.  He is currently taking Florinef 1 tablet daily.  The repeat blood pressure after the nurse checked it today by me was 147/86.  This is in the right arm sitting with a large cuff and was done manually.  The patient denies any chest pain or shortness of breath.  He denies any trouble with nausea vomiting change in bowel habits blood in the stool or black tarry bowel movements.  His voiding is normal.  He is getting lab work drawn today and we will make sure that we discussed with the nephrologist and send him a hard copy of the results about what he would like for [place] to do with his vitamin D level.    Patient Active Problem List   Diagnosis Date Noted  . Hyperkalemia 03/08/2017  . Non-seasonal allergic rhinitis due to pollen 11/06/2016  . S/P ORIF (open reduction internal fixation) fracture   . Closed right hip fracture, initial encounter ([facility]) 05/09/2016  . Benign prostatic hyperplasia without lower urinary tract symptoms 04/18/2016   . Vitamin D deficiency 04/18/2016  . OSA (obstructive sleep apnea) 10/06/2014  . Screening examination for venereal disease 12/23/2013  . Pseudophakia of both eyes 06/23/2013  . Pain in joint, lower leg 11/19/2012  . Unspecified sinusitis (chronic) 09/16/2012  . Right groin wound 08/09/2012  . HIV disease ([facility]) 06/02/2012  . Convulsions/seizures ([facility]) 05/16/2012  . Pulmonary infiltrate 04/18/2012  . Gastroparesis 04/18/2012  . Anemia, iron deficiency   . Type 1 diabetes mellitus ([facility]) 05/08/2011  . Acquired hypothyroidism 05/08/2011  . Chronic kidney disease, stage 3, mod decreased GFR ([facility]) 12/31/2010   Outpatient Encounter Medications as of 04/23/2017  Medication Sig  . abacavir-dolutegravir-lamiVUDine (TRIUMEQ) 600-50-300 MG tablet Take 1 tablet by mouth daily.  Marland Kitchen acetaminophen (TYLENOL) 500 MG tablet Take 1,000 mg by mouth 2 (two) times daily.   Marland Kitchen acyclovir (ZOVIRAX) 400 MG tablet TAKE 1 TABLET (400 MG TOTAL) BY MOUTH 2 (TWO) TIMES DAILY.  Marland Kitchen AGAMATRIX ULTRA-THIN LANCETS MISC TEST BS 4 TIMES A DAY AND PRN. E10.65  . aspirin 81 MG chewable tablet Chew 81 mg by mouth every morning.  . cetirizine (ZYRTEC) 10 MG tablet Take 1 tablet (10 mg total) by mouth daily.  . Continuous Blood Gluc Receiver (FREESTYLE LIBRE READER) DEVI 1 applicator by Does not apply route 4 (four) times daily as needed.  . Continuous Blood Gluc Sensor (FREESTYLE LIBRE  SENSOR SYSTEM) MISC Check BS QID and PRN.  . cyclobenzaprine (FLEXERIL) 10 MG tablet Take 1 tablet (10 mg total) by mouth 3 (three) times daily as needed for muscle spasms.  . DULoxetine (CYMBALTA) 60 MG capsule Take 1 capsule (60 mg total) by mouth 2 (two) times daily. As directed  . esomeprazole (NEXIUM) 40 MG capsule Take 1 capsule (40 mg total) by mouth daily.  . ferrous sulfate 325 (65 FE) MG tablet Take 650 mg by mouth daily with breakfast.   . fluticasone (FLONASE) 50 MCG/ACT nasal spray Place 2 sprays into both nostrils daily.  . furosemide  (LASIX) 40 MG tablet Take 20 mg by mouth 2 (two) times daily.  . Glucosamine-Chondroit-Vit C-Mn (GLUCOSAMINE 1500 COMPLEX PO) Take 1 tablet by mouth 2 (two) times daily.   . insulin lispro (HUMALOG) 100 UNIT/ML injection Use 42units to 120 units PER PUMP daily  AS DIRECTED  . levothyroxine (SYNTHROID, LEVOTHROID) 175 MCG tablet TAKE 1 TABLET (175 MCG TOTAL) BY MOUTH DAILY BEFORE BREAKFAST.  Marland Kitchen LORazepam (ATIVAN) 0.5 MG tablet TAKE 1 TABLET BY MOUTH EVERY DAY AS NEEDED (Patient taking differently: TAKE 1/2 TABLET BY MOUTH EVERY NIGHT AT BEDTIME AS NEEDED FOR SLEEP)  . metoCLOPramide (REGLAN) 5 MG tablet TAKE 1 TABLET BY MOUTH 3 TIMES A DAY BEFORE MEALS  . niacin (NIASPAN) 1000 MG CR tablet TAKE 1 TABLET (1,000 MG TOTAL) BY MOUTH AT BEDTIME. (Patient taking differently: TAKE 1 TABLET (1,500 MG TOTAL) BY MOUTH AT BEDTIME.)  . omega-3 acid ethyl esters (LOVAZA) 1 g capsule TAKE 2 CAPSULES (2 G TOTAL) BY MOUTH 2 (TWO) TIMES DAILY.  Marland Kitchen ondansetron (ZOFRAN) 4 MG tablet Take 1 tablet (4 mg total) by mouth every 8 (eight) hours as needed for nausea.  Glory Rosebush VERIO test strip TEST BLOOD SUGAR 4 TIMES DAILY AND AS NEEDED  . OVER THE COUNTER MEDICATION Take 1 capsule by mouth daily. [name]- probiotic daily  . simvastatin (ZOCOR) 40 MG tablet TAKE 1 TABLET (40 MG TOTAL) BY MOUTH AT BEDTIME.  Marland Kitchen Testosterone 20.25 MG/ACT (1.62%) GEL APPLY 2 PUMPS DAILY AS DIRECTED  . traMADol (ULTRAM) 50 MG tablet Take 1 tablet (50 mg total) by mouth every 12 (twelve) hours as needed. for pain  . ULORIC 40 MG tablet TAKE 1 TABLET BY MOUTH EVERY DAY  . [DISCONTINUED] benzonatate (TESSALON) 200 MG capsule TAKE ONE CAPSULE BY MOUTH 3 TIMES A DAY AS NEEDED FOR COUGH  . [DISCONTINUED] HYDROcodone-homatropine (HYCODAN) 5-1.5 MG/5ML syrup TAKE 5 ML BY MOUTH EVERY 4-6 HOURS AS NEEDED FOR COUGH. MAX OF 30 ML PER 24 HOURS  . [DISCONTINUED] insulin aspart (NOVOLOG) 100 UNIT/ML injection as directed. Per insulin pump. Basal rate is 36-38  units with bolus insulin per carbohydrate intake   No facility-administered encounter medications on file as of 04/23/2017.      Review of Systems  Constitutional: Negative.   HENT: Negative.   Eyes: Negative.   Respiratory: Negative.   Cardiovascular: Negative.   Gastrointestinal: Negative.   Endocrine: Negative.   Genitourinary: Negative.   Musculoskeletal: Negative.   Skin: Negative.   Allergic/Immunologic: Negative.   Neurological: Negative.   Hematological: Negative.   Psychiatric/Behavioral: Negative.        Objective:   Physical Exam  Constitutional: He is oriented to person, place, and time. He appears well-developed and well-nourished. No distress.  The patient is pleasant and alert.  HENT:  Head: Normocephalic and atraumatic.  Right Ear: External ear normal.  Left Ear: External ear normal.  Mouth/Throat: Oropharynx is clear and moist. No oropharyngeal exudate.  Nasal turbinate congestion and pallor bilaterally left greater than right  Eyes: Conjunctivae and EOM are normal. Pupils are equal, round, and reactive to light. Right eye exhibits no discharge. Left eye exhibits no discharge. No scleral icterus.  Neck: Normal range of motion. Neck supple. No thyromegaly present.  No bruits.  Cardiovascular: Normal rate, regular rhythm, normal heart sounds and intact distal pulses.  No murmur heard. Heart is regular at 72/min  Pulmonary/Chest: Effort normal and breath sounds normal. No respiratory distress. He has no wheezes. He has no rales. He exhibits no tenderness.  Clear anteriorly and posteriorly  Abdominal: Soft. Bowel sounds are normal. He exhibits no mass. There is no tenderness. There is no rebound and no guarding.  No abdominal masses organ enlargement or bruits  Musculoskeletal: Normal range of motion. He exhibits no edema.  Lymphadenopathy:    He has no cervical adenopathy.  Neurological: He is alert and oriented to person, place, and time. He has normal  reflexes. No cranial nerve deficit.  Skin: Skin is warm and dry. No rash noted.  Psychiatric: He has a normal mood and affect. His behavior is normal. Judgment and thought content normal.  Nursing note and vitals reviewed.  BP (!) 147/87 (BP Location: Left Arm)   Pulse 79   Temp (!) 97 F (36.1 C) (Oral)   Ht '5\' 10"'$  (1.778 m)   Wt 216 lb (98 kg)   BMI 30.99 kg/m   Repeat blood pressure in the office by me 147/86 right arm sitting with a large cuff     Assessment & Plan:  1. Testosterone deficiency -Continue current treatment - CBC with Differential/Platelet  2. Pure hypercholesterolemia -Continue current treatment pending results of lab work - BMP8+EGFR - CBC with Differential/Platelet - Hepatic function panel - Lipid panel  3. Vitamin D deficiency -Continue current treatment pending recommendations of nephrologist - CBC with Differential/Platelet - VITAMIN D 25 Hydroxy (Vit-D Deficiency, Fractures)  4. Uncontrolled type 1 diabetes mellitus with stage 3 chronic kidney disease ([facility]) -Continue current treatment with frequent monitoring at home - Bayer DCA Hb A1c Waived - CBC with Differential/Platelet  5. Benign prostatic hyperplasia without lower urinary tract symptoms -No complaints today with voiding - CBC with Differential/Platelet  6. Iron deficiency anemia, unspecified iron deficiency anemia type - CBC with Differential/Platelet  7. History of gout -Check uric acid - CBC with Differential/Platelet  8. Chronic kidney disease, stage 3, mod decreased GFR ([facility]) -Continue follow-up with nephrology - BMP8+EGFR - CBC with Differential/Platelet  9. Essential hypertension -Elevated blood pressure in a diabetic patient.  Meds ordered this encounter  Medications  . glucose blood (ONETOUCH VERIO) test strip    Sig: TEST BLOOD SUGAR 4 TIMES DAILY AND AS NEEDED    Dispense:  100 each    Refill:  11    E11.9  . LORazepam (ATIVAN) 0.5 MG tablet    Sig: Take 1  tablet (0.5 mg total) by mouth daily as needed.    Dispense:  30 tablet    Refill:  5    Not to exceed 3 additional fills before 02/09/2017  . Testosterone 20.25 MG/ACT (1.62%) GEL    Sig: APPLY 3 PUMPS DAILY AS DIRECTED    Dispense:  150 g    Refill:  3    Not to exceed 5 additional fills before 08/09/2017   Patient Instructions  Medicare Annual Wellness Visit  [facility] and the medical providers at [facility] strive to bring you the best medical care.  In doing so we not only want to address your current medical conditions and concerns but also to detect new conditions early and prevent illness, disease and health-related problems.    Medicare offers a yearly Wellness Visit which allows our clinical staff to assess your need for preventative services including immunizations, lifestyle education, counseling to decrease risk of preventable diseases and screening for fall risk and other medical concerns.    This visit is provided free of charge (no copay) for all Medicare recipients. The clinical pharmacists at [facility] have begun to conduct these Wellness Visits which will also include a thorough review of all your medications.    As you primary medical provider recommend that you make an appointment for your Annual Wellness Visit if you have not done so already this year.  You may set up this appointment before you leave today or you may call back ([phone]) and schedule an appointment.  Please make sure when you call that you mention that you are scheduling your Annual Wellness Visit with the clinical pharmacist so that the appointment may be made for the proper length of time.    Continue current medications. Continue good therapeutic lifestyle changes which include good diet and exercise. Fall precautions discussed with patient. If an FOBT was given today- please return it to our front desk. If you are over 83 years  old - you may need Prevnar 71 or the adult Pneumonia vaccine.  **Flu shots are available--- please call and schedule a FLU-CLINIC appointment**  After your visit with [place] today you will receive a survey in the mail or online from [employer] regarding your care with [place]. Please take a moment to fill this out. Your feedback is very important to [place] as you can help [place] better understand your patient needs as well as improve your experience and satisfaction. WE CARE ABOUT YOU!!!  We will call you with the results of the BMP after checking the potassium and the creatinine. There would most likely be some changes in blood pressure medicine and vitamin D medicine and we will discussed this with the nephrologist before making those changes. Take Tessalon Perles as needed for cough and congestion and continue to drink plenty of fluids and stay well-hydrated   Arrie Senate MD

## 2017-04-23 NOTE — Patient Instructions (Addendum)
Medicare Annual Wellness Visit  [facility] and the medical providers at [facility] strive to bring you the best medical care.  In doing so we not only want to address your current medical conditions and concerns but also to detect new conditions early and prevent illness, disease and health-related problems.    Medicare offers a yearly Wellness Visit which allows our clinical staff to assess your need for preventative services including immunizations, lifestyle education, counseling to decrease risk of preventable diseases and screening for fall risk and other medical concerns.    This visit is provided free of charge (no copay) for all Medicare recipients. The clinical pharmacists at [facility] have begun to conduct these Wellness Visits which will also include a thorough review of all your medications.    As you primary medical provider recommend that you make an appointment for your Annual Wellness Visit if you have not done so already this year.  You may set up this appointment before you leave today or you may call back ([phone]) and schedule an appointment.  Please make sure when you call that you mention that you are scheduling your Annual Wellness Visit with the clinical pharmacist so that the appointment may be made for the proper length of time.    Continue current medications. Continue good therapeutic lifestyle changes which include good diet and exercise. Fall precautions discussed with patient. If an FOBT was given today- please return it to our front desk. If you are over 39 years old - you may need Prevnar 53 or the adult Pneumonia vaccine.  **Flu shots are available--- please call and schedule a FLU-CLINIC appointment**  After your visit with [place] today you will receive a survey in the mail or online from [employer] regarding your care with [place]. Please take a moment to fill this out. Your feedback is very  important to [place] as you can help [place] better understand your patient needs as well as improve your experience and satisfaction. WE CARE ABOUT YOU!!!  We will call you with the results of the BMP after checking the potassium and the creatinine. There would most likely be some changes in blood pressure medicine and vitamin D medicine and we will discussed this with the nephrologist before making those changes. Take Tessalon Perles as needed for cough and congestion and continue to drink plenty of fluids and stay well-hydrated

## 2017-04-23 NOTE — Telephone Encounter (Signed)
Med orders re-signed

## 2017-04-24 ENCOUNTER — Ambulatory Visit: Payer: Medicare Other | Admitting: Internal Medicine

## 2017-04-24 ENCOUNTER — Encounter: Payer: Self-pay | Admitting: Internal Medicine

## 2017-04-24 ENCOUNTER — Other Ambulatory Visit (HOSPITAL_COMMUNITY)
Admission: RE | Admit: 2017-04-24 | Discharge: 2017-04-24 | Disposition: A | Discharge status: Home / Self Care | Payer: Medicare Other | Source: Ambulatory Visit | Attending: Internal Medicine | Admitting: Internal Medicine

## 2017-04-24 VITALS — BP 162/91 | HR 79 | Temp 97.9°F | Ht 70.0 in | Wt 220.0 lb

## 2017-04-24 DIAGNOSIS — Z113 Encounter for screening for infections with a predominantly sexual mode of transmission: Secondary | ICD-10-CM | POA: Insufficient documentation

## 2017-04-24 DIAGNOSIS — N183 Chronic kidney disease, stage 3 unspecified: Secondary | ICD-10-CM

## 2017-04-24 DIAGNOSIS — B20 Human immunodeficiency virus [HIV] disease: Secondary | ICD-10-CM | POA: Diagnosis not present

## 2017-04-24 LAB — CBC WITH DIFFERENTIAL/PLATELET
BASOS ABS: 0.1 10*3/uL (ref 0.0–0.2)
Basos: 1 %
EOS (ABSOLUTE): 0.4 10*3/uL (ref 0.0–0.4)
Eos: 5 %
Hematocrit: 35.9 % — ABNORMAL LOW (ref 37.5–51.0)
Hemoglobin: 12.2 g/dL — ABNORMAL LOW (ref 13.0–17.7)
IMMATURE GRANS (ABS): 0 10*3/uL (ref 0.0–0.1)
IMMATURE GRANULOCYTES: 1 %
LYMPHS: 29 %
Lymphocytes Absolute: 2.3 10*3/uL (ref 0.7–3.1)
MCH: 33.4 pg — ABNORMAL HIGH (ref 26.6–33.0)
MCHC: 34 g/dL (ref 31.5–35.7)
MCV: 98 fL — ABNORMAL HIGH (ref 79–97)
MONOS ABS: 0.9 10*3/uL (ref 0.1–0.9)
Monocytes: 11 %
NEUTROS PCT: 53 %
Neutrophils Absolute: 4.2 10*3/uL (ref 1.4–7.0)
PLATELETS: 256 10*3/uL (ref 150–379)
RBC: 3.65 x10E6/uL — ABNORMAL LOW (ref 4.14–5.80)
RDW: 13.9 % (ref 12.3–15.4)
WBC: 7.9 10*3/uL (ref 3.4–10.8)

## 2017-04-24 LAB — BMP8+EGFR
BUN/Creatinine Ratio: 16 (ref 9–20)
BUN: 32 mg/dL — ABNORMAL HIGH (ref 6–24)
CALCIUM: 9.1 mg/dL (ref 8.7–10.2)
CHLORIDE: 102 mmol/L (ref 96–106)
CO2: 23 mmol/L (ref 20–29)
Creatinine, Ser: 1.96 mg/dL — ABNORMAL HIGH (ref 0.76–1.27)
GFR calc Af Amer: 45 mL/min/{1.73_m2} — ABNORMAL LOW (ref >59)
GFR calc non Af Amer: 39 mL/min/{1.73_m2} — ABNORMAL LOW (ref >59)
GLUCOSE: 175 mg/dL — AB (ref 65–99)
POTASSIUM: 4.3 mmol/L (ref 3.5–5.2)
Sodium: 138 mmol/L (ref 134–144)

## 2017-04-24 LAB — HEPATIC FUNCTION PANEL
ALT: 48 IU/L — ABNORMAL HIGH (ref 0–44)
AST: 36 IU/L (ref 0–40)
Albumin: 4 g/dL (ref 3.5–5.5)
Alkaline Phosphatase: 107 IU/L (ref 39–117)
BILIRUBIN, DIRECT: 0.07 mg/dL (ref 0.00–0.40)
TOTAL PROTEIN: 6.7 g/dL (ref 6.0–8.5)

## 2017-04-24 LAB — VITAMIN D 25 HYDROXY (VIT D DEFICIENCY, FRACTURES): VIT D 25 HYDROXY: 12.6 ng/mL — AB (ref 30.0–100.0)

## 2017-04-24 LAB — LIPID PANEL
Chol/HDL Ratio: 3.7 ratio (ref 0.0–5.0)
Cholesterol, Total: 144 mg/dL (ref 100–199)
HDL: 39 mg/dL — AB (ref >39)
LDL Calculated: 73 mg/dL (ref 0–99)
TRIGLYCERIDES: 162 mg/dL — AB (ref 0–149)
VLDL Cholesterol Cal: 32 mg/dL (ref 5–40)

## 2017-04-24 NOTE — Assessment & Plan Note (Signed)
Will screen today, though not sexually active.

## 2017-04-24 NOTE — Assessment & Plan Note (Signed)
His creat remains stable so no dose adjustments indicated.

## 2017-04-24 NOTE — Assessment & Plan Note (Signed)
He is doing well, labs today and rtc in 6 months unless concerns.

## 2017-04-24 NOTE — Progress Notes (Signed)
CC: Follow up for HIV  Interval history: Currently is asymptomatic and well-controlled on Triumeq.  Since last visit he has had no new issues.  Has no associated n/v/d.  Denies any missed doses.    Saw his PCP this week and Vitamin D low.  Creat improved some.  Last CD4 350, viral load <20.  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    ROS Constitutional: negative for fatigue and malaise Integument/breast: negative for rash  Physical Exam: CONSTITUTIONAL:in no apparent distress  Eyes: anicteric HENT: no thrush, no cervical lymphadenopathy Respiratory: Normal respiratory effort; CTA B  Lab Results  Component Value Date   HIV1RNAQUANT <20 DETECTED (A) 09/23/2016   HIV1RNAQUANT <20 NOT DETECTED 03/26/2016   HIV1RNAQUANT <20 09/18/2015   SH: no sexual activity

## 2017-04-25 ENCOUNTER — Telehealth: Payer: Self-pay | Admitting: *Deleted

## 2017-04-25 LAB — RPR: RPR: NONREACTIVE

## 2017-04-25 LAB — URINE CYTOLOGY ANCILLARY ONLY
CHLAMYDIA, DNA PROBE: NEGATIVE
Neisseria Gonorrhea: NEGATIVE

## 2017-04-25 LAB — T-HELPER CELL (CD4) - (RCID CLINIC ONLY)
CD4 T CELL HELPER: 14 % — AB (ref 33–55)
CD4 T Cell Abs: 300 /uL — ABNORMAL LOW (ref 400–2700)

## 2017-04-25 MED ORDER — GLUCOSE BLOOD VI STRP
ORAL_STRIP | 4 refills | Status: AC
Start: 2017-04-25 — End: ?

## 2017-04-25 NOTE — Telephone Encounter (Signed)
Fax received 90d supply requested Refill sent to pharmacy

## 2017-04-26 LAB — HIV-1 RNA QUANT-NO REFLEX-BLD
HIV 1 RNA QUANT: NOT DETECTED {copies}/mL
HIV-1 RNA Quant, Log: <1.3 Log copies/mL

## 2017-04-28 ENCOUNTER — Other Ambulatory Visit: Payer: Self-pay | Admitting: Nurse Practitioner

## 2017-04-28 DIAGNOSIS — E103551 Type 1 diabetes mellitus with stable proliferative diabetic retinopathy, right eye: Secondary | ICD-10-CM | POA: Diagnosis not present

## 2017-04-28 DIAGNOSIS — E103552 Type 1 diabetes mellitus with stable proliferative diabetic retinopathy, left eye: Secondary | ICD-10-CM | POA: Diagnosis not present

## 2017-04-28 DIAGNOSIS — E1039 Type 1 diabetes mellitus with other diabetic ophthalmic complication: Secondary | ICD-10-CM | POA: Diagnosis not present

## 2017-04-28 LAB — HM DIABETES EYE EXAM

## 2017-04-29 ENCOUNTER — Other Ambulatory Visit: Payer: Self-pay | Admitting: Internal Medicine

## 2017-05-05 ENCOUNTER — Ambulatory Visit: Payer: Medicare Other | Admitting: *Deleted

## 2017-05-08 ENCOUNTER — Ambulatory Visit: Payer: Medicare Other | Admitting: Family Medicine

## 2017-05-13 ENCOUNTER — Ambulatory Visit (INDEPENDENT_AMBULATORY_CARE_PROVIDER_SITE_OTHER): Payer: Medicare Other | Admitting: Family Medicine

## 2017-05-13 ENCOUNTER — Ambulatory Visit (INDEPENDENT_AMBULATORY_CARE_PROVIDER_SITE_OTHER): Payer: Medicare Other

## 2017-05-13 ENCOUNTER — Encounter: Payer: Self-pay | Admitting: Family Medicine

## 2017-05-13 VITALS — BP 144/91 | HR 82 | Temp 97.4°F | Ht 70.0 in | Wt 208.0 lb

## 2017-05-13 DIAGNOSIS — R058 Other specified cough: Secondary | ICD-10-CM

## 2017-05-13 DIAGNOSIS — R05 Cough: Secondary | ICD-10-CM

## 2017-05-13 DIAGNOSIS — E109 Type 1 diabetes mellitus without complications: Secondary | ICD-10-CM | POA: Diagnosis not present

## 2017-05-13 DIAGNOSIS — Z794 Long term (current) use of insulin: Secondary | ICD-10-CM | POA: Diagnosis not present

## 2017-05-13 MED ORDER — LIDOCAINE VISCOUS 2 % MT SOLN: OROMUCOSAL | 0 refills | Status: DC

## 2017-05-13 NOTE — Progress Notes (Signed)
Subjective: LS:LHTDS PCP: Chipper Herb, MD KAJ:GOTLX Sciandra is a 47 y.o. male presenting to clinic today for:  1. Cough Patient reports that he has had an intermittent cough for several months.  He notes that he saw his primary care provider in mid March and was told that cough seem to be secondary to allergies.  Patient has been using Flonase nasal spray, Tessalon Perles, Delsym, humidification, Zyrtec with no complete resolution of symptoms.  He notes that previously, these seem to calm down his cough but lately it has been very persistent.  He notes that the dry cough and often starts as a tickle at the back of his throat.  He denies gross rhinorrhea, nasal congestion, chest congestion, fevers, chills, shortness of breath, wheeze.  No acid reflux symptoms.  He has been using Hycodan syrup which she got in January 2019 in the emergency department with good suppression of the cough but he notes that this makes him excessively drowsy.  He is wondering what he can do to relieve the cough.   ROS: Per HPI  Allergies  Allergen Reactions  . Sulfa Antibiotics Other (See Comments)    High potassium  . Ramipril Cough  . Versed [Midazolam] Other (See Comments)    "I don't wake up very good or clear it out of my system"   Past Medical History:  Diagnosis Date  . Anemia, iron deficiency On procrit  . CAP (community acquired pneumonia)   . CKD (chronic kidney disease) stage 3, GFR 30-59 ml/min (HCC)   . Degenerative arthritis   . Depression   . Dyslipidemia   . Gastroesophageal reflux disease   . Gastroparesis diabeticorum ([place])   . Hematuria, microscopic 10/09   work up negative (Dr. Amalia Hailey)  . HIV positive ([place])   . 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  Hypothyroidism   . IDDM (insulin dependent diabetes mellitus) ([place])    38 years  . Low HDL (under 40)   . Proteinuria   . Retinopathy    x2  . SIRS (systemic inflammatory response syndrome) (HCC)     Current Outpatient Medications:  .  acetaminophen (TYLENOL) 500 MG tablet, Take 1,000 mg by mouth 2 (two) times daily. , Disp: , Rfl:  .  acyclovir (ZOVIRAX) 400 MG tablet, TAKE 1 TABLET (400 MG TOTAL) BY MOUTH 2 (TWO) TIMES DAILY., Disp: , Rfl:  .  AGAMATRIX ULTRA-THIN LANCETS MISC, TEST BS 4 TIMES A DAY AND PRN. E10.65, Disp: 100 each, Rfl: 11 .  aspirin 81 MG chewable tablet, Chew 81 mg by mouth every morning., Disp: , Rfl:  .  cetirizine (ZYRTEC) 10 MG tablet, Take 1 tablet (10 mg total) by mouth daily., Disp: 30 tablet, Rfl: 11 .  Continuous Blood Gluc Receiver (FREESTYLE LIBRE READER) DEVI, 1 applicator by Does not apply route 4 (four) times daily as needed., Disp: 1 Device, Rfl: 1 .  Continuous Blood Gluc Sensor (FREESTYLE LIBRE SENSOR SYSTEM) MISC, Check BS QID and PRN., Disp: 3 each, Rfl: 11 .  cyclobenzaprine (FLEXERIL) 10 MG tablet, Take 1 tablet (10 mg total) by mouth 3 (three) times daily as needed for muscle spasms., Disp: 30 tablet, Rfl: 0 .  DULoxetine (CYMBALTA) 60 MG capsule, Take 1 capsule (60 mg total) by mouth 2 (two) times daily. As directed, Disp:  60 capsule, Rfl: 2 .  esomeprazole (NEXIUM) 40 MG capsule, Take 1 capsule (40 mg total) by mouth daily., Disp: 90 capsule, Rfl: 1 .  ferrous sulfate 325 (65 FE) MG tablet, Take 650 mg by mouth daily with breakfast. , Disp: , Rfl:  .  fludrocortisone (FLORINEF) 0.1 MG tablet, Take 0.1 mg by mouth daily., Disp: , Rfl:  .  fluticasone (FLONASE) 50 MCG/ACT nasal spray, Place 2 sprays into both nostrils daily., Disp: 16 g, Rfl: 11 .  furosemide (LASIX) 40 MG tablet, Take 20 mg by mouth 2 (two) times daily., Disp: , Rfl: 4 .  Glucosamine-Chondroit-Vit C-Mn (GLUCOSAMINE 1500 COMPLEX PO), Take 1 tablet by mouth 2 (two) times daily. ,  Disp: , Rfl:  .  glucose blood (ONETOUCH VERIO) test strip, TEST BLOOD SUGAR 4 TIMES DAILY AND AS NEEDED, Disp: 300 each, Rfl: 4 .  insulin lispro (HUMALOG) 100 UNIT/ML injection, Use 42units to 120 units PER PUMP daily  AS DIRECTED, Disp: 60 mL, Rfl: 11 .  levothyroxine (SYNTHROID, LEVOTHROID) 175 MCG tablet, TAKE 1 TABLET (175 MCG TOTAL) BY MOUTH DAILY BEFORE BREAKFAST., Disp: 30 tablet, Rfl: 11 .  LORazepam (ATIVAN) 0.5 MG tablet, Take 1 tablet (0.5 mg total) by mouth daily as needed., Disp: 30 tablet, Rfl: 5 .  metoCLOPramide (REGLAN) 5 MG tablet, TAKE 1 TABLET BY MOUTH 3 TIMES A DAY BEFORE MEALS, Disp: 90 tablet, Rfl: 4 .  niacin (NIASPAN) 1000 MG CR tablet, TAKE 1 TABLET (1,000 MG TOTAL) BY MOUTH AT BEDTIME. (Patient taking differently: TAKE 1 TABLET (1,500 MG TOTAL) BY MOUTH AT BEDTIME.), Disp: 30 tablet, Rfl: 5 .  omega-3 acid ethyl esters (LOVAZA) 1 g capsule, TAKE 2 CAPSULES (2 G TOTAL) BY MOUTH 2 (TWO) TIMES DAILY., Disp: 360 capsule, Rfl: 1 .  ondansetron (ZOFRAN) 4 MG tablet, Take 1 tablet (4 mg total) by mouth every 8 (eight) hours as needed for nausea., Disp: 15 tablet, Rfl: 0 .  OVER THE COUNTER MEDICATION, Take 1 capsule by mouth daily. Phillips- probiotic daily, Disp: , Rfl:  .  simvastatin (ZOCOR) 40 MG tablet, TAKE 1 TABLET (40 MG TOTAL) BY MOUTH AT BEDTIME., Disp: 90 tablet, Rfl: 1 .  Testosterone 20.25 MG/ACT (1.62%) GEL, APPLY 3 PUMPS DAILY AS DIRECTED, Disp: 150 g, Rfl: 3 .  traMADol (ULTRAM) 50 MG tablet, Take 1 tablet (50 mg total) by mouth every 12 (twelve) hours as needed. for pain, Disp: 60 tablet, Rfl: 0 .  TRIUMEQ 600-50-300 MG tablet, TAKE 1 TABLET BY MOUTH DAILY, Disp: 30 tablet, Rfl: 5 .  ULORIC 40 MG tablet, TAKE 1 TABLET BY MOUTH EVERY DAY, Disp: 90 tablet, Rfl: 1 Social History   Socioeconomic History  . Marital status: Single    Spouse name: Not on file  . Number of children: 0  . Years of education: AAS  . Highest education level: Not on file    Occupational History  . Occupation: EMT    Employer: [employer]  Social Needs  . Financial resource strain: Not on file  . Food insecurity:    Worry: Not on file    Inability: Not on file  . Transportation needs:    Medical: Not on file    Non-medical: Not on file  Tobacco Use  . Smoking status: Never Smoker  . Smokeless tobacco: Never Used  Substance and Sexual Activity  . Alcohol use: No    Alcohol/week: 0.0 oz    Comment: Infrequent, less  than 1 x per month.  . Drug use: No  . Sexual activity: Not Currently    Comment: declined condoms  Lifestyle  . Physical activity:    Days per week: Not on file    Minutes per session: Not on file  . Stress: Not on file  Relationships  . Social connections:    Talks on phone: Not on file    Gets together: Not on file    Attends religious service: Not on file    Active member of club or organization: Not on file    Attends meetings of clubs or organizations: Not on file    Relationship status: Not on file  . Intimate partner violence:    Fear of current or ex partner: Not on file    Emotionally abused: Not on file    Physically abused: Not on file    Forced sexual activity: Not on file  Other Topics Concern  . Not on file  Social History Narrative   Single.  Lives at home with mom.  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    Family History  Problem Relation Age of Onset  . Diabetes type I Brother   . Prostate cancer Other   . Dementia Other   . Dementia Other   . Dementia Other     Objective: Office vital signs reviewed. BP (!) 144/91   Pulse 82   Temp (!) 97.4 F (36.3 C) (Oral)   Ht 5\' 10"  (1.778 m)   Wt 208 lb (94.3 kg)   SpO2 98%   BMI 29.84 kg/m   Physical Examination:  General: Awake, alert, well nourished, nontoxic, No acute distress HEENT: Normal    Neck: No masses palpated. No lymphadenopathy    Ears: Tympanic membranes intact, normal light reflex, no erythema, no bulging     Eyes: PERRLA, extraocular membranes intact, sclera white    Nose: nasal turbinates moist, clear nasal discharge    Throat: moist mucus membranes, no erythema, no tonsillar exudate.  Cobblestone appearance of oropharynx.  Airway is patent Cardio: regular rate and rhythm, S1S2 heard, no murmurs appreciated Pulm: clear to auscultation bilaterally, no wheezes, rhonchi or rales; normal work of breathing on room air Extremities: no edema  Dg Chest 2 View  Result Date: 05/13/2017 CLINICAL DATA:  Persistent cough EXAM: CHEST - 2 VIEW COMPARISON:  05/11/2016 FINDINGS: Heart and mediastinal contours are within normal limits. No focal opacities or effusions. No acute bony abnormality. IMPRESSION: No active cardiopulmonary disease. Electronically Signed   By: Rolm Baptise M.D.   On: 05/13/2017 10:05    Assessment/ Plan: 47 y.o. male   1. Cough present for greater than 3 weeks Patient with normal vital signs except for elevated blood pressure.  He is afebrile with normal oxygen saturation on room air.  His cardiopulmonary exam were unremarkable.  However given his HIV status and persistent cough, a chest x-ray was obtained which was negative for any evidence of pneumonia or active cardiopulmonary disease.  Posterior oropharynx with cobblestone appearance.  I agree that this is likely secondary to allergic rhinitis.  However, it seems that he is now in a chronic cough cycle.  From what he tells me, that Hurricaine spray that was used in the emergency department seem to relieve the cough.  Will do a trial of viscous lidocaine gargle to see if perhaps this will reduce the posterior throat irritation causing these coughing spells.  I instructed him to use this regularly for the next 2-3 days  every 4 hours gargled and spit out.  Then do a trial off of the viscous lidocaine to see if the cycle has been broken.  Continue current allergy medications.  I recommended that he follow-up with his primary care doctor in the  next 2 weeks for recheck.  Could consider adding Singulair at that time which likely would not need to be renally dosed in this patient versus trial of PPI versus referral to pulmonology for chronic cough. - DG Chest 2 View; Future   Orders Placed This Encounter  Procedures  . DG Chest 2 View    Standing Status:   Future    Standing Expiration Date:   07/14/2018    Order Specific Question:   Reason for Exam (SYMPTOM  OR DIAGNOSIS REQUIRED)    Answer:   cough >2 months    Order Specific Question:   Preferred imaging location?    Answer:   Internal    Order Specific Question:   Radiology Contrast Protocol - do NOT remove file path    Answer:   [email or web address]   Meds ordered this encounter  Medications  . lidocaine (XYLOCAINE) 2 % solution    Sig: Gargle and spit out 10 mL every 4 hours as needed.    Dispense:  100 mL    Refill:  [place], DO [place] [phone]

## 2017-05-13 NOTE — Patient Instructions (Signed)
Cough, Adult  Coughing is a reflex that clears your throat and your airways. Coughing helps to heal and protect your lungs. It is normal to cough occasionally, but a cough that happens with other symptoms or lasts a long time may be a sign of a condition that needs treatment. A cough may last only 2-3 weeks (acute), or it may last longer than 8 weeks (chronic).  What are the causes?  Coughing is commonly caused by:   Breathing in substances that irritate your lungs.   A viral or bacterial respiratory infection.   Allergies.   Asthma.   Postnasal drip.   Smoking.   Acid backing up from the stomach into the esophagus (gastroesophageal reflux).   Certain medicines.   Chronic lung problems, including COPD (or rarely, lung cancer).   Other medical conditions such as heart failure.    Follow these instructions at home:  Pay attention to any changes in your symptoms. Take these actions to help with your discomfort:   Take medicines only as told by your health care provider.  ? If you were prescribed an antibiotic medicine, take it as told by your health care provider. Do not stop taking the antibiotic even if you start to feel better.  ? Talk with your health care provider before you take a cough suppressant medicine.   Drink enough fluid to keep your urine clear or pale yellow.   If the air is dry, use a cold steam vaporizer or humidifier in your bedroom or your home to help loosen secretions.   Avoid anything that causes you to cough at work or at home.   If your cough is worse at night, try sleeping in a semi-upright position.   Avoid cigarette smoke. If you smoke, quit smoking. If you need help quitting, ask your health care provider.   Avoid caffeine.   Avoid alcohol.   Rest as needed.    Contact a health care provider if:   You have new symptoms.   You cough up pus.   Your cough does not get better after 2-3 weeks, or your cough gets worse.   You cannot control your cough with suppressant  medicines and you are losing sleep.   You develop pain that is getting worse or pain that is not controlled with pain medicines.   You have a fever.   You have unexplained weight loss.   You have night sweats.  Get help right away if:   You cough up blood.   You have difficulty breathing.   Your heartbeat is very fast.  This information is not intended to replace advice given to you by your health care provider. Make sure you discuss any questions you have with your health care provider.  Document Released: 07/20/2010 Document Revised: 06/29/2015 Document Reviewed: 03/30/2014  Elsevier Interactive Patient Education  2018 Elsevier Inc[employer]

## 2017-05-15 ENCOUNTER — Ambulatory Visit (INDEPENDENT_AMBULATORY_CARE_PROVIDER_SITE_OTHER): Payer: Medicare Other | Admitting: Family Medicine

## 2017-05-15 ENCOUNTER — Encounter: Payer: Self-pay | Admitting: Family Medicine

## 2017-05-15 VITALS — BP 145/82 | HR 81 | Temp 99.9°F | Ht 70.0 in | Wt 208.0 lb

## 2017-05-15 DIAGNOSIS — J301 Allergic rhinitis due to pollen: Secondary | ICD-10-CM

## 2017-05-15 MED ORDER — HYDROCODONE-HOMATROPINE 5-1.5 MG/5ML PO SYRP
5.0000 mL | ORAL_SOLUTION | Freq: Four times a day (QID) | ORAL | 0 refills | Status: DC | PRN
Start: 2017-05-15 — End: 2018-05-27

## 2017-05-15 NOTE — Progress Notes (Signed)
BP (!) 145/82   Pulse 81   Temp 99.9 F (37.7 C) (Oral)   Ht 5\' 10"  (1.778 m)   Wt 208 lb (94.3 kg)   BMI 29.84 kg/m    Subjective:    Patient ID: Patrick Brown, male    DOB: July 13, 1970, 47 y.o.   MRN: [id number]  HPI: [name] is a 47 y.o. male presenting on 05/15/2017 for Cough, low grade fever (seen on Monday with cough, given viscous lidocaine, has Tessalong Perles and Hycodan, almost out of both)   HPI Cough and congestion and low-grade fever Patient has been having cough and congestion and has a low-grade fever of 99 that has been going on for the past few weeks.  He has been treated for his allergies but he just does not seem to be fully clearing them.  He did not know he had a temperature until he came here in the office today.  He denies any shortness of breath or wheezing or chest congestion.  He was given viscous lidocaine and Tessalon Perles and Hycodan and they have been helping but he has not been able to fully clear and he is running out of those.  Relevant past medical, surgical, family and social history reviewed and updated as indicated. Interim medical history since our last visit reviewed. Allergies and medications reviewed and updated.  Review of Systems  Constitutional: Positive for fever. Negative for chills.  HENT: Positive for congestion, postnasal drip, rhinorrhea, sinus pressure, sneezing and sore throat. Negative for ear discharge, ear pain and voice change.   Eyes: Negative for pain, discharge, redness and visual disturbance.  Respiratory: Positive for cough. Negative for shortness of breath and wheezing.   Cardiovascular: Negative for chest pain and leg swelling.  Musculoskeletal: Negative for gait problem.  Skin: Negative for rash.  All other systems reviewed and are negative.   Per HPI unless specifically indicated above   Allergies as of 05/15/2017      Reactions   Sulfa Antibiotics Other (See Comments)   High potassium   Ramipril Cough     Versed [midazolam] Other (See Comments)   "I don't wake up very good or clear it out of my system"      Medication List        Accurate as of 05/15/17 12:01 PM. Always use your most recent med list.          acetaminophen 500 MG tablet Commonly known as:  TYLENOL Take 1,000 mg by mouth 2 (two) times daily.   acyclovir 400 MG tablet Commonly known as:  ZOVIRAX TAKE 1 TABLET (400 MG TOTAL) BY MOUTH 2 (TWO) TIMES DAILY.   AGAMATRIX ULTRA-THIN LANCETS Misc TEST BS 4 TIMES A DAY AND PRN. E10.65   aspirin 81 MG chewable tablet Chew 81 mg by mouth every morning.   cetirizine 10 MG tablet Commonly known as:  ZYRTEC Take 1 tablet (10 mg total) by mouth daily.   cyclobenzaprine 10 MG tablet Commonly known as:  FLEXERIL Take 1 tablet (10 mg total) by mouth 3 (three) times daily as needed for muscle spasms.   DULoxetine 60 MG capsule Commonly known as:  CYMBALTA Take 1 capsule (60 mg total) by mouth 2 (two) times daily. As directed   esomeprazole 40 MG capsule Commonly known as:  NEXIUM Take 1 capsule (40 mg total) by mouth daily.   ferrous sulfate 325 (65 FE) MG tablet Take 650 mg by mouth daily with breakfast.   fludrocortisone 0.1 MG  tablet Commonly known as:  FLORINEF Take 0.1 mg by mouth daily.   fluticasone 50 MCG/ACT nasal spray Commonly known as:  FLONASE Place 2 sprays into both nostrils daily.   FREESTYLE LIBRE READER Devi 1 applicator by Does not apply route 4 (four) times daily as needed.   FREESTYLE LIBRE SENSOR SYSTEM Misc Check BS QID and PRN.   furosemide 40 MG tablet Commonly known as:  LASIX Take 20 mg by mouth 2 (two) times daily.   GLUCOSAMINE 1500 COMPLEX PO Take 1 tablet by mouth 2 (two) times daily.   glucose blood test strip Commonly known as:  ONETOUCH VERIO TEST BLOOD SUGAR 4 TIMES DAILY AND AS NEEDED   HYDROcodone-homatropine 5-1.5 MG/5ML syrup Commonly known as:  HYCODAN Take 5 mLs by mouth every 6 (six) hours as needed for  cough.   insulin lispro 100 UNIT/ML injection Commonly known as:  HUMALOG Use 42units to 120 units PER PUMP daily  AS DIRECTED   levothyroxine 175 MCG tablet Commonly known as:  SYNTHROID, LEVOTHROID TAKE 1 TABLET (175 MCG TOTAL) BY MOUTH DAILY BEFORE BREAKFAST.   lidocaine 2 % solution Commonly known as:  XYLOCAINE Gargle and spit out 10 mL every 4 hours as needed.   LORazepam 0.5 MG tablet Commonly known as:  ATIVAN Take 1 tablet (0.5 mg total) by mouth daily as needed.   metoCLOPramide 5 MG tablet Commonly known as:  REGLAN TAKE 1 TABLET BY MOUTH 3 TIMES A DAY BEFORE MEALS   niacin 1000 MG CR tablet Commonly known as:  NIASPAN TAKE 1 TABLET (1,000 MG TOTAL) BY MOUTH AT BEDTIME.   omega-3 acid ethyl esters 1 g capsule Commonly known as:  LOVAZA TAKE 2 CAPSULES (2 G TOTAL) BY MOUTH 2 (TWO) TIMES DAILY.   ondansetron 4 MG tablet Commonly known as:  ZOFRAN Take 1 tablet (4 mg total) by mouth every 8 (eight) hours as needed for nausea.   OVER THE COUNTER MEDICATION Take 1 capsule by mouth daily. Phillips- probiotic daily   simvastatin 40 MG tablet Commonly known as:  ZOCOR TAKE 1 TABLET (40 MG TOTAL) BY MOUTH AT BEDTIME.   Testosterone 20.25 MG/ACT (1.62%) Gel APPLY 3 PUMPS DAILY AS DIRECTED   traMADol 50 MG tablet Commonly known as:  ULTRAM Take 1 tablet (50 mg total) by mouth every 12 (twelve) hours as needed. for pain   TRIUMEQ 600-50-300 MG tablet Generic drug:  abacavir-dolutegravir-lamiVUDine TAKE 1 TABLET BY MOUTH DAILY   ULORIC 40 MG tablet Generic drug:  febuxostat TAKE 1 TABLET BY MOUTH EVERY DAY          Objective:    BP (!) 145/82   Pulse 81   Temp 99.9 F (37.7 C) (Oral)   Ht 5\' 10"  (1.778 m)   Wt 208 lb (94.3 kg)   BMI 29.84 kg/m   Wt Readings from Last 3 Encounters:  05/15/17 208 lb (94.3 kg)  05/13/17 208 lb (94.3 kg)  04/24/17 220 lb (99.8 kg)    Physical Exam  Constitutional: He is oriented to person, place, and time. He  appears well-developed and well-nourished. No distress.  HENT:  Right Ear: Tympanic membrane, external ear and ear canal normal.  Left Ear: Tympanic membrane, external ear and ear canal normal.  Nose: Mucosal edema and rhinorrhea present. No sinus tenderness. No epistaxis. Right sinus exhibits no maxillary sinus tenderness and no frontal sinus tenderness. Left sinus exhibits no maxillary sinus tenderness and no frontal sinus tenderness.  Mouth/Throat: Uvula is midline  and mucous membranes are normal. Posterior oropharyngeal edema present. No oropharyngeal exudate, posterior oropharyngeal erythema or tonsillar abscesses.  Eyes: Pupils are equal, round, and reactive to light. Conjunctivae and EOM are normal. Right eye exhibits no discharge. No scleral icterus.  Neck: Neck supple. No thyromegaly present.  Cardiovascular: Normal rate, regular rhythm, normal heart sounds and intact distal pulses.  No murmur heard. Pulmonary/Chest: Effort normal and breath sounds normal. No respiratory distress. He has no wheezes. He has no rales.  Musculoskeletal: Normal range of motion. He exhibits no edema.  Lymphadenopathy:    He has no cervical adenopathy.  Neurological: He is alert and oriented to person, place, and time. Coordination normal.  Skin: Skin is warm and dry. No rash noted. He is not diaphoretic.  Psychiatric: He has a normal mood and affect. His behavior is normal.  Nursing note and vitals reviewed.       Assessment & Plan:   Problem List Items Addressed This Visit    None    Visit Diagnoses    Seasonal allergic rhinitis due to pollen    -  Primary   Recommended adding Benadryl to his Zyrtec and Flonase and seeing if that helps his congestion, return if worsens, may consider steroids in future       Follow up plan: Return if symptoms worsen or fail to improve.  Counseling provided for all of the vaccine components No orders of the defined types were placed in this  encounter.   Caryl Pina, MD [facility] 05/15/2017, 12:01 PM

## 2017-05-16 ENCOUNTER — Telehealth: Payer: Self-pay | Admitting: Family Medicine

## 2017-05-16 ENCOUNTER — Other Ambulatory Visit: Payer: Self-pay | Admitting: Family Medicine

## 2017-05-16 ENCOUNTER — Ambulatory Visit: Payer: Self-pay | Admitting: Pulmonary Disease

## 2017-05-16 MED ORDER — CYCLOBENZAPRINE HCL 10 MG PO TABS: 10.0000 mg | ORAL_TABLET | Freq: Three times a day (TID) | ORAL | 0 refills | Status: DC | PRN

## 2017-05-16 MED ORDER — BENZONATATE 100 MG PO CAPS: 100.0000 mg | ORAL_CAPSULE | Freq: Three times a day (TID) | ORAL | 0 refills | Status: DC | PRN

## 2017-05-16 MED ORDER — LIDOCAINE VISCOUS 2 % MT SOLN: OROMUCOSAL | 0 refills | Status: DC

## 2017-05-16 NOTE — Telephone Encounter (Signed)
If its not getting better he needs to be seen.  I will refill but these medications are not for long term use.  He should follow up with PCP.

## 2017-05-16 NOTE — Telephone Encounter (Signed)
Please refill lidocaine and perles. Cough is so so bad.Clarnce Flock Dettinger, but can only take syrup at night

## 2017-05-18 DIAGNOSIS — R0982 Postnasal drip: Secondary | ICD-10-CM | POA: Diagnosis not present

## 2017-05-18 DIAGNOSIS — R05 Cough: Secondary | ICD-10-CM | POA: Diagnosis not present

## 2017-05-19 NOTE — Telephone Encounter (Signed)
Pt aware.

## 2017-05-20 ENCOUNTER — Ambulatory Visit (INDEPENDENT_AMBULATORY_CARE_PROVIDER_SITE_OTHER): Payer: Medicare Other | Admitting: *Deleted

## 2017-05-20 VITALS — BP 148/95 | HR 97 | Temp 98.8°F | Ht 70.0 in | Wt 208.0 lb

## 2017-05-20 DIAGNOSIS — Z0000 Encounter for general adult medical examination without abnormal findings: Secondary | ICD-10-CM

## 2017-05-20 NOTE — Patient Instructions (Signed)
Keep Follow up appointments   Patrick Brown , Thank you for taking time to come for your Medicare Wellness Visit. I appreciate your ongoing commitment to your health goals. Please review the following plan we discussed and let me know if I can assist you in the future.   These are the goals we discussed: Goals    . Exercise 3x per week (30 min per time)     Exercise for at least 30 minutes, 3 times weekly        This is a list of the screening recommended for you and due dates:  Health Maintenance  Topic Date Due  . Pneumococcal vaccine (2) 06/18/2017  . Urine Protein Check  09/02/2017  . Flu Shot  09/04/2017  . Hemoglobin A1C  10/24/2017  . Complete foot exam   04/24/2018  . Eye exam for diabetics  04/29/2018  . Tetanus Vaccine  11/04/2020  . HIV Screening  Completed

## 2017-05-20 NOTE — Progress Notes (Addendum)
Subjective:   Patrick Brown is a 47 y.o. male who presents for an Initial Medicare Annual Wellness Visit.  Patrick Brown previously worked with [facility] [employer] [employer] [facility] [employer] [facility] until he experienced a fall and broke his hip.  He is currently working on getting back to work part-time.  Patrick Brown lives at home with his mother Patrick Brown and Patrick Brown.  He does not have any hobbies nor does he exercise.  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  Patrick Brown normally eats toast for breakfast, meat and veggies for lunch and peanut butter cracker for dinner.  He has had 2 ER visits due to cough and elevated potassium and one surgery for broken hip.  Overall Patrick Brown feels that his health is better now than it was a year ago.   Objective:    Today's Vitals   05/20/17 0929 05/20/17 0932  BP: (!) 143/90 (!) 148/95  Pulse: (!) 101 97  Temp: 98.8 F (37.1 C)   TempSrc: Oral   Weight: 208 lb (94.3 kg)   Height: 5\' 10"  (1.778 m)    Body mass index is 29.84 kg/m.  Advanced Directives 06/10/2016 05/09/2016 06/08/2015 10/06/2014 09/26/2014 06/30/2014 12/15/2012  Does Patient Have a Medical Advance Directive? No No No No No No Patient does not have advance directive  Type of Advance Directive - - - - - - -  Copy of Healthcare Power of Attorney in Chart? - - - - - - -  Would patient like information on creating a medical advance directive? - - - - - No - patient declined information -  Pre-existing out of facility DNR order (yellow form or pink MOST form) - - - - - - No  Refused Advance directive packet  Current Medications (verified) Outpatient Encounter Medications as of 05/20/2017  Medication Sig  . acetaminophen (TYLENOL) 500 MG tablet Take 1,000 mg by mouth 2 (two) times daily.   Marland Kitchen acyclovir (ZOVIRAX) 400 MG tablet TAKE 1 TABLET (400 MG TOTAL) BY MOUTH 2 (TWO) TIMES DAILY.  Marland Kitchen AGAMATRIX ULTRA-THIN LANCETS MISC TEST BS 4 TIMES A DAY AND PRN. E10.65  . aspirin 81 MG chewable tablet Chew 81 mg by mouth every  morning.  . benzonatate (TESSALON) 100 MG capsule Take 1 capsule (100 mg total) by mouth 3 (three) times daily as needed for cough.  . cetirizine (ZYRTEC) 10 MG tablet Take 1 tablet (10 mg total) by mouth daily.  . Continuous Blood Gluc Receiver (FREESTYLE LIBRE READER) DEVI 1 applicator by Does not apply route 4 (four) times daily as needed.  . Continuous Blood Gluc Sensor (FREESTYLE LIBRE SENSOR SYSTEM) MISC Check BS QID and PRN.  . cyclobenzaprine (FLEXERIL) 10 MG tablet Take 1 tablet (10 mg total) by mouth 3 (three) times daily as needed for muscle spasms.  . DULoxetine (CYMBALTA) 60 MG capsule Take 1 capsule (60 mg total) by mouth 2 (two) times daily. As directed  . esomeprazole (NEXIUM) 40 MG capsule Take 1 capsule (40 mg total) by mouth daily.  . ferrous sulfate 325 (65 FE) MG tablet Take 650 mg by mouth daily with breakfast.   . fludrocortisone (FLORINEF) 0.1 MG tablet Take 0.1 mg by mouth daily.  . fluticasone (FLONASE) 50 MCG/ACT nasal spray Place 2 sprays into both nostrils daily.  . furosemide (LASIX) 40 MG tablet Take 20 mg by mouth 2 (two) times daily.  . Glucosamine-Chondroit-Vit C-Mn (GLUCOSAMINE 1500 COMPLEX PO) Take 1 tablet by mouth 2 (two) times daily.   Marland Kitchen  glucose blood (ONETOUCH VERIO) test strip TEST BLOOD SUGAR 4 TIMES DAILY AND AS NEEDED  . HYDROcodone-homatropine (HYCODAN) 5-1.5 MG/5ML syrup Take 5 mLs by mouth every 6 (six) hours as needed for cough.  . insulin lispro (HUMALOG) 100 UNIT/ML injection Use 42units to 120 units PER PUMP daily  AS DIRECTED  . levothyroxine (SYNTHROID, LEVOTHROID) 175 MCG tablet TAKE 1 TABLET (175 MCG TOTAL) BY MOUTH DAILY BEFORE BREAKFAST.  Marland Kitchen lidocaine (XYLOCAINE) 2 % solution Gargle and spit out 10 mL every 4 hours as needed.  Marland Kitchen LORazepam (ATIVAN) 0.5 MG tablet Take 1 tablet (0.5 mg total) by mouth daily as needed.  . metoCLOPramide (REGLAN) 5 MG tablet TAKE 1 TABLET BY MOUTH 3 TIMES A DAY BEFORE MEALS  . niacin (NIASPAN) 1000 MG CR tablet  TAKE 1 TABLET (1,000 MG TOTAL) BY MOUTH AT BEDTIME. (Patient taking differently: TAKE 1 TABLET (1,500 MG TOTAL) BY MOUTH AT BEDTIME.)  . omega-3 acid ethyl esters (LOVAZA) 1 g capsule TAKE 2 CAPSULES (2 G TOTAL) BY MOUTH 2 (TWO) TIMES DAILY.  Marland Kitchen ondansetron (ZOFRAN) 4 MG tablet Take 1 tablet (4 mg total) by mouth every 8 (eight) hours as needed for nausea.  Marland Kitchen OVER THE COUNTER MEDICATION Take 1 capsule by mouth daily. [name]- probiotic daily  . simvastatin (ZOCOR) 40 MG tablet TAKE 1 TABLET (40 MG TOTAL) BY MOUTH AT BEDTIME.  Marland Kitchen Testosterone 20.25 MG/ACT (1.62%) GEL APPLY 3 PUMPS DAILY AS DIRECTED  . traMADol (ULTRAM) 50 MG tablet Take 1 tablet (50 mg total) by mouth every 12 (twelve) hours as needed. for pain  . TRIUMEQ 600-50-300 MG tablet TAKE 1 TABLET BY MOUTH DAILY  . ULORIC 40 MG tablet TAKE 1 TABLET BY MOUTH EVERY DAY  . ipratropium (ATROVENT) 0.03 % nasal spray USE 2 SPRAY IN EACH NOSTRIL 2-3 TIMES DAILY   No facility-administered encounter medications on file as of 05/20/2017.     Allergies (verified) Sulfa antibiotics; Ramipril; and Versed [midazolam]   History: Past Medical History:  Diagnosis Date  . Anemia, iron deficiency On procrit  . CAP (community acquired pneumonia)   . CKD (chronic kidney disease) stage 3, GFR 30-59 ml/min (HCC)   . Degenerative arthritis   . Depression   . Dyslipidemia   . Gastroesophageal reflux disease   . Gastroparesis diabeticorum ([place])   . Hematuria, microscopic 10/09   work up negative (Dr. Amalia Hailey)  . HIV positive ([place])   . 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 Hypothyroidism   . IDDM (insulin dependent diabetes mellitus) ([place])    38 years  . Low HDL (under 40)   . Proteinuria   . Retinopathy    x2  . SIRS  (systemic inflammatory response syndrome) (HCC)    Past Surgical History:  Procedure Laterality Date  . CATARACT EXTRACTION Right   . COLONOSCOPY    . ESOPHAGOGASTRODUODENOSCOPY    . EYE SURGERY  2006,2001   x2   . HIP ARTHROPLASTY Right 05/10/2016   Procedure: RIGHT HIP HEMIARTHROPLASTY;  Surgeon: Marchia Bond, MD;  Location: [facility];  Service: Orthopedics;  Laterality: Right;  . insulin pump    . LASIK Bilateral   . VIDEO BRONCHOSCOPY Bilateral 12/15/2012   Procedure: VIDEO BRONCHOSCOPY WITH FLUORO;  Surgeon: Kathee Delton, MD;  Location: [facility];  Service: Cardiopulmonary;  Laterality:  Bilateral;  . VITRECTOMY  bilateral   Family History  Problem Relation Age of Onset  . Diabetes type I Brother   . Prostate cancer Other   . Dementia Other   . Dementia Other   . Dementia Other    Social History   Socioeconomic History  . Marital status: Single    Spouse name: Not on file  . Number of children: 0  . Years of education: AAS  . Highest education level: Not on file  Occupational History  . Occupation: EMT    Employer: [employer]: [employer]  Social Needs  . Financial resource strain: Not hard at all  . Food insecurity:    Worry: Never true    Inability: Never true  . Transportation needs:    Medical: No    Non-medical: No  Tobacco Use  . Smoking status: Never Smoker  . Smokeless tobacco: Never Used  Substance and Sexual Activity  . Alcohol use: No    Alcohol/week: 0.0 oz    Comment: Infrequent, less than 1 x per month.  . Drug use: No  . Sexual activity: Not Currently    Comment: declined condoms  Lifestyle  . Physical activity:    Days per week: 0 days    Minutes per session: 0 min  . Stress: Not at all  Relationships  . Social connections:    Talks on phone: More than three times a week    Gets together: More than three times a week    Attends religious service: More than 4 times per year    Active member of club or  organization: Yes    Attends meetings of clubs or organizations: More than 4 times per year    Relationship status: Never married  Other Topics Concern  . Not on file  Social History Narrative   Single.  Lives at home with mom.  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    Tobacco Counseling No tobacco use  Clinical Intake:  Pre-visit preparation completed: No  Pain : No/denies pain     Diabetes: Yes CBG done?: No Did pt. bring in CBG monitor from home?: No  How often do you need to have someone help you when you read instructions, pamphlets, or other written materials from your doctor or pharmacy?: 1 - Never  Interpreter Needed?: No     Activities of Daily Living In your present state of health, do you have any difficulty performing the following activities: 05/20/2017 09/23/2016  Hearing? N N  Vision? N N  Difficulty concentrating or making decisions? N N  Walking or climbing stairs? N N  Dressing or bathing? N N  Doing errands, shopping? N N  Some recent data might be hidden  No trouble with ADLs at this time   Immunizations and Health Maintenance Immunization History  Administered Date(s) Administered  . Hepatitis A, Adult 12/23/2013, 09/05/2014  . Hepatitis B, adult 12/23/2013, 02/01/2014, 09/05/2014  . Influenza Split 11/05/2011  . Influenza,inj,Quad PF,6+ Mos 11/15/2013, 11/28/2014, 11/10/2015, 11/13/2016  . Influenza-Unspecified 11/12/2012  . Meningococcal Mcv4o 03/26/2016  . Pneumococcal Polysaccharide-23 06/18/2012  Up to date on health maintenance  There are no preventive care reminders to display for this patient.  Patient Care Team: Chipper Herb, MD as PCP - General (Family Medicine) Comer, Okey Regal, MD as PCP - Infectious Diseases (Infectious Diseases) Rankin, Clent Demark, MD (Ophthalmology) Chesley Mires, MD as Consulting Physician (Pulmonary Disease) Harriett Sine, MD as Consulting Physician (Dermatology) Antionette Fairy,  Glori Luis, MD as Referring Physician  (Internal Medicine)  Indicate any recent Medical Services you may have received from other than [facility] providers in the past year (date may be approximate).    Assessment:   This is a routine wellness examination for Patrick Brown.  Hearing/Vision screen Recent eye exam in chart   Dietary issues and exercise activities discussed: Current Exercise Habits: The patient does not participate in regular exercise at present, Exercise limited by: None identified  Goals    . Exercise 3x per week (30 min per time)     Exercise for at least 30 minutes, 3 times weekly       Depression Screen PHQ 2/9 Scores 05/20/2017 05/15/2017 05/13/2017 04/24/2017  PHQ - 2 Score 0 0 0 0  PHQ- 9 Score - - - -    Fall Risk Fall Risk  05/20/2017 05/13/2017 04/24/2017 04/23/2017 04/02/2017  Falls in the past year? No No Yes Yes No  Number falls in past yr: - - 1 1 -  Injury with Fall? No - Yes Yes -  Follow up - - - - -    Is the patient's home free of loose throw rugs in walkways, pet beds, electrical cords, etc?   yes      Grab bars in the bathroom? yes       Handrails on the stairs?   yes      Adequate lighting?   yes  Timed Get Up and Go performed:   Cognitive Function: MMSE - Mini Mental State Exam 05/20/2017  Orientation to time 5  Orientation to Place 5  Registration 3  Attention/ Calculation 5  Recall 3  Language- name 2 objects 2  Language- repeat 1  Language- follow 3 step command 3  Language- read & follow direction 1  Write a sentence 1  Copy design 1  Total score 30  No memory loss noted       Screening Tests Health Maintenance  Topic Date Due  . PNEUMOCOCCAL POLYSACCHARIDE VACCINE (2) 06/18/2017  . URINE MICROALBUMIN  09/02/2017  . INFLUENZA VACCINE  09/04/2017  . HEMOGLOBIN A1C  10/24/2017  . FOOT EXAM  04/24/2018  . OPHTHALMOLOGY EXAM  04/29/2018  . TETANUS/TDAP  11/04/2020  . HIV Screening  Completed    Qualifies for Shingles Vaccine?  Cancer Screenings: Lung: Low Dose CT Chest  recommended if Age 57-80 years, 30 pack-year currently smoking OR have quit w/in 15years. Patient does not qualify. Colorectal: NO  Additional Screenings:  Hepatitis C Screening:       Plan:  Encouraged Patrick Brown to eat 3 meals daily that consist of lean proteins, fruits, and vegetables.  To increase vegetable intake.  Encouraged to exercise for at least 30 minutes 3 times weekly.  Encouraged to keep all follow up appointments with primary care provider and specialist. Explained the importance of and Advance directive  I have personally reviewed and noted the following in the patient's chart:   . Medical and social history . Use of alcohol, tobacco or illicit drugs  . Current medications and supplements . Functional ability and status . Nutritional status . Physical activity . Advanced directives . List of other physicians . Hospitalizations, surgeries, and ER visits in previous 12 months . Vitals . Screenings to include cognitive, depression, and falls . Referrals and appointments  In addition, I have reviewed and discussed with patient certain preventive protocols, quality metrics, and best practice recommendations. A written personalized care plan for preventive services as well  as general preventive health recommendations were provided to patient.     Wardell Heath, LPN   [id number]   I have reviewed and agree with the above AWV documentation.   Mary-Margaret Hassell Done, FNP

## 2017-05-25 ENCOUNTER — Other Ambulatory Visit: Payer: Self-pay | Admitting: Family Medicine

## 2017-05-27 ENCOUNTER — Ambulatory Visit (INDEPENDENT_AMBULATORY_CARE_PROVIDER_SITE_OTHER): Payer: Medicare Other | Admitting: Family Medicine

## 2017-05-27 ENCOUNTER — Encounter: Payer: Self-pay | Admitting: Family Medicine

## 2017-05-27 VITALS — BP 127/89 | HR 80 | Temp 97.1°F | Ht 70.0 in | Wt 209.0 lb

## 2017-05-27 DIAGNOSIS — R059 Cough, unspecified: Secondary | ICD-10-CM

## 2017-05-27 DIAGNOSIS — R05 Cough: Secondary | ICD-10-CM | POA: Diagnosis not present

## 2017-05-27 NOTE — Progress Notes (Signed)
Subjective: CC: f/u chronic cough PCP: Chipper Herb, MD QMV:HQION Pang is a 47 y.o. male presenting to clinic today for:  1. Cough Patient reports that cough has resolved, after addition of Atrovent nasal spray which was prescribed for him in the emergency department.  He notes he is also reduced his Zyrtec to 5 mg daily.   ROS: Per HPI  Allergies  Allergen Reactions  . Sulfa Antibiotics Other (See Comments)    High potassium  . Ramipril Cough  . Versed [Midazolam] Other (See Comments)    "I don't wake up very good or clear it out of my system"   Past Medical History:  Diagnosis Date  . Anemia, iron deficiency On procrit  . CAP (community acquired pneumonia)   . CKD (chronic kidney disease) stage 3, GFR 30-59 ml/min (HCC)   . Degenerative arthritis   . Depression   . Dyslipidemia   . Gastroesophageal reflux disease   . Gastroparesis diabeticorum ([place])   . Hematuria, microscopic 10/09   work up negative (Dr. Amalia Hailey)  . HIV positive ([place])   . 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 Hypothyroidism   . IDDM (insulin dependent diabetes mellitus) ([place])    38 years  . Low HDL (under 40)   . Proteinuria   . Retinopathy    x2  . SIRS (systemic inflammatory response syndrome) (HCC)     Current Outpatient Medications:  .  acetaminophen (TYLENOL) 500 MG tablet, Take 1,000 mg by mouth 2 (two) times daily. , Disp: , Rfl:  .  acyclovir (ZOVIRAX) 400 MG tablet, TAKE 1 TABLET (400 MG TOTAL) BY MOUTH 2 (TWO) TIMES DAILY., Disp: , Rfl:  .  AGAMATRIX ULTRA-THIN LANCETS MISC, TEST BS 4 TIMES A DAY AND PRN. E10.65, Disp: 100 each, Rfl: 11 .  aspirin 81 MG chewable tablet, Chew 81 mg by mouth every morning., Disp: , Rfl:  .  benzonatate (TESSALON) 100 MG  capsule, Take 1 capsule (100 mg total) by mouth 3 (three) times daily as needed for cough., Disp: 20 capsule, Rfl: 0 .  cetirizine (ZYRTEC) 10 MG tablet, Take 1 tablet (10 mg total) by mouth daily., Disp: 30 tablet, Rfl: 11 .  Continuous Blood Gluc Receiver (FREESTYLE LIBRE READER) DEVI, 1 applicator by Does not apply route 4 (four) times daily as needed., Disp: 1 Device, Rfl: 1 .  Continuous Blood Gluc Sensor (FREESTYLE LIBRE SENSOR SYSTEM) MISC, Check BS QID and PRN., Disp: 3 each, Rfl: 11 .  cyclobenzaprine (FLEXERIL) 10 MG tablet, Take 1 tablet (10 mg total) by mouth 3 (three) times daily as needed for muscle spasms., Disp: 30 tablet, Rfl: 0 .  DULoxetine (CYMBALTA) 60 MG capsule, Take 1 capsule (60 mg total) by mouth 2 (two) times daily. As directed, Disp: 60 capsule, Rfl: 2 .  esomeprazole (NEXIUM) 40 MG capsule, Take 1 capsule (40 mg total) by mouth daily., Disp: 90 capsule, Rfl: 1 .  ferrous sulfate 325 (65 FE) MG tablet, Take 650 mg by mouth daily with breakfast. , Disp: , Rfl:  .  fludrocortisone (FLORINEF) 0.1 MG tablet, Take 0.1 mg by mouth daily., Disp: , Rfl:  .  fluticasone (FLONASE) 50 MCG/ACT nasal spray, Place 2 sprays into both nostrils daily., Disp: 16 g, Rfl: 11 .  furosemide (LASIX) 40 MG tablet, Take  20 mg by mouth 2 (two) times daily., Disp: , Rfl: 4 .  Glucosamine-Chondroit-Vit C-Mn (GLUCOSAMINE 1500 COMPLEX PO), Take 1 tablet by mouth 2 (two) times daily. , Disp: , Rfl:  .  glucose blood (ONETOUCH VERIO) test strip, TEST BLOOD SUGAR 4 TIMES DAILY AND AS NEEDED, Disp: 300 each, Rfl: 4 .  HYDROcodone-homatropine (HYCODAN) 5-1.5 MG/5ML syrup, Take 5 mLs by mouth every 6 (six) hours as needed for cough., Disp: 240 mL, Rfl: 0 .  insulin lispro (HUMALOG) 100 UNIT/ML injection, Use 42units to 120 units PER PUMP daily  AS DIRECTED, Disp: 60 mL, Rfl: 11 .  ipratropium (ATROVENT) 0.03 % nasal spray, USE 2 SPRAY IN EACH NOSTRIL 2-3 TIMES DAILY, Disp: , Rfl: 1 .  levothyroxine  (SYNTHROID, LEVOTHROID) 175 MCG tablet, TAKE 1 TABLET (175 MCG TOTAL) BY MOUTH DAILY BEFORE BREAKFAST., Disp: 30 tablet, Rfl: 11 .  lidocaine (XYLOCAINE) 2 % solution, Gargle and spit out 10 mL every 4 hours as needed., Disp: 100 mL, Rfl: 0 .  LORazepam (ATIVAN) 0.5 MG tablet, Take 1 tablet (0.5 mg total) by mouth daily as needed., Disp: 30 tablet, Rfl: 5 .  metoCLOPramide (REGLAN) 5 MG tablet, TAKE 1 TABLET BY MOUTH 3 TIMES A DAY BEFORE MEALS, Disp: 90 tablet, Rfl: 4 .  niacin (NIASPAN) 1000 MG CR tablet, TAKE 1 TABLET (1,000 MG TOTAL) BY MOUTH AT BEDTIME. (Patient taking differently: TAKE 1 TABLET (1,500 MG TOTAL) BY MOUTH AT BEDTIME.), Disp: 30 tablet, Rfl: 5 .  omega-3 acid ethyl esters (LOVAZA) 1 g capsule, TAKE 2 CAPSULES (2 G TOTAL) BY MOUTH 2 (TWO) TIMES DAILY., Disp: 360 capsule, Rfl: 1 .  ondansetron (ZOFRAN) 4 MG tablet, Take 1 tablet (4 mg total) by mouth every 8 (eight) hours as needed for nausea., Disp: 15 tablet, Rfl: 0 .  ondansetron (ZOFRAN) 4 MG tablet, TAKE 1 TABLET (4 MG TOTAL) BY MOUTH EVERY 8 (EIGHT) HOURS AS NEEDED FOR NAUSEA., Disp: 45 tablet, Rfl: 0 .  OVER THE COUNTER MEDICATION, Take 1 capsule by mouth daily. Phillips- probiotic daily, Disp: , Rfl:  .  simvastatin (ZOCOR) 40 MG tablet, TAKE 1 TABLET (40 MG TOTAL) BY MOUTH AT BEDTIME., Disp: 90 tablet, Rfl: 1 .  Testosterone 20.25 MG/ACT (1.62%) GEL, APPLY 3 PUMPS DAILY AS DIRECTED, Disp: 150 g, Rfl: 3 .  traMADol (ULTRAM) 50 MG tablet, Take 1 tablet (50 mg total) by mouth every 12 (twelve) hours as needed. for pain, Disp: 60 tablet, Rfl: 0 .  TRIUMEQ 600-50-300 MG tablet, TAKE 1 TABLET BY MOUTH DAILY, Disp: 30 tablet, Rfl: 5 .  ULORIC 40 MG tablet, TAKE 1 TABLET BY MOUTH EVERY DAY, Disp: 90 tablet, Rfl: 1 Social History   Socioeconomic History  . Marital status: Single    Spouse name: Not on file  . Number of children: 0  . Years of education: AAS  . Highest education level: Not on file  Occupational History  .  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 Financial resource strain: Not hard at all  . Food insecurity:    Worry: Never true    Inability: Never true  . Transportation needs:    Medical: No    Non-medical: No  Tobacco Use  . Smoking status: Never Smoker  . Smokeless tobacco: Never Used  Substance and Sexual Activity  . Alcohol use: No    Alcohol/week: 0.0 oz    Comment: Infrequent, less than  1 x per month.  . Drug use: No  . Sexual activity: Not Currently    Comment: declined condoms  Lifestyle  . Physical activity:    Days per week: 0 days    Minutes per session: 0 min  . Stress: Not at all  Relationships  . Social connections:    Talks on phone: More than three times a week    Gets together: More than three times a week    Attends religious service: More than 4 times per year    Active member of club or organization: Yes    Attends meetings of clubs or organizations: More than 4 times per year    Relationship status: Never married  . Intimate partner violence:    Fear of current or ex partner: No    Emotionally abused: No    Physically abused: No    Forced sexual activity: No  Other Topics Concern  . Not on file  Social History Narrative   Single.  Lives at home with mom.  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    Family History  Problem Relation Age of Onset  . Diabetes type I Brother   . Prostate cancer Other   . Dementia Other   . Dementia Other   . Dementia Other     Objective: Office vital signs reviewed. BP 127/89   Pulse 80   Temp (!) 97.1 F (36.2 C) (Oral)   Ht 5\' 10"  (1.778 m)   Wt 209 lb (94.8 kg)   SpO2 97%   BMI 29.99 kg/m   Physical Examination:  General: Awake, alert, No acute distress Pulm: clear to auscultation bilaterally, no wheezes, rhonchi or rales; normal work of breathing on room air  Assessment/ Plan: 47 y.o. male   1. Cough Resolved.  Patient with normal vital signs, including  pulse ox on room air.  Cardiopulmonary exam unremarkable.  Continue to follow with PCP as needed.  Janora Norlander, DO [place] [phone]

## 2017-05-31 ENCOUNTER — Other Ambulatory Visit: Payer: Self-pay | Admitting: Family Medicine

## 2017-06-02 ENCOUNTER — Ambulatory Visit: Payer: Medicare Other | Admitting: Pulmonary Disease

## 2017-06-02 ENCOUNTER — Encounter: Payer: Self-pay | Admitting: Pulmonary Disease

## 2017-06-02 VITALS — BP 132/86 | HR 87 | Ht 70.0 in | Wt 206.0 lb

## 2017-06-02 DIAGNOSIS — Z9989 Dependence on other enabling machines and devices: Secondary | ICD-10-CM | POA: Diagnosis not present

## 2017-06-02 DIAGNOSIS — G4733 Obstructive sleep apnea (adult) (pediatric): Secondary | ICD-10-CM

## 2017-06-02 NOTE — Patient Instructions (Signed)
Follow up in 1 year.

## 2017-06-02 NOTE — Progress Notes (Signed)
[facility]  Chief Complaint  Patient presents with  . Follow-up    Feels everything is going well at this point.    Vital signs: BP 132/86 (BP Location: Left Arm, Cuff Size: Normal)   Pulse 87   Ht 5\' 10"  (1.778 m)   Wt 206 lb (93.4 kg)   SpO2 98%   BMI 29.56 kg/m   History of Present Illness: Patrick Brown is a 47 y.o. male with obstructive sleep apnea.  He has been doing well with CPAP.  No issues with mask fit.  Uses full face mask.  Sleeps well, and rested during the day.  Had trouble with cough few weeks ago, and couldn't use CPAP then.  Had hip fracture since his last visit.  Physical Exam:  General - pleasant Eyes - pupils reactive, wears glasses ENT - no sinus tenderness, no oral exudate, no LAN, MP 4, scalloped tongue Cardiac - regular, no murmur Chest - no wheeze, rales Abd - soft, non tender Ext - no edema Skin - no rashes Neuro - normal strength Psych - normal mood   Assessment/Plan:  Obstructive sleep apnea. - he is compliant with CPAP and reports benefit from therapy - continue auto CPAP    Patient Instructions  Follow up in 1 year    Chesley Mires, MD [place] 06/02/2017, 3:42 PM  Flow Sheet  Sleep tests: PSG 09/26/14 >> AHI 11.3, SpO2 low 86%, PLMI 51.9 Auto CPAP 04/30/17 to 05/29/17 >> used on 26 to 30 nights with average 9 hrs 48 min.  Average AHI 4.1 with median CPAP 8 and 95 th percentile CPAP 12 cm H2O  Past Medical History: He  has a past medical history of Anemia, iron deficiency (On procrit), CAP (community acquired pneumonia), CKD (chronic kidney disease) stage 3, GFR 30-59 ml/min ([place]), Degenerative arthritis, Depression, Dyslipidemia, Gastroesophageal reflux disease, Gastroparesis diabeticorum ([place]), Hematuria, microscopic (10/09), HIV positive ([place]), Hyperkalemia, diminished renal excretion (06/2011 secondary to TMP/SMZ; prior secondary to  ARBS; ), Hypothyroidism, IDDM  (insulin dependent diabetes mellitus) ([place]), Low HDL (under 40), Proteinuria, Retinopathy, and SIRS (systemic inflammatory response syndrome) ([place]).  Past Surgical History: He  has a past surgical history that includes Vitrectomy (bilateral); insulin pump; Colonoscopy; Esophagogastroduodenoscopy; LASIK (Bilateral); Cataract extraction (Right); Video bronchoscopy (Bilateral, 12/15/2012); Eye surgery (1610,9604); and Hip Arthroplasty (Right, 05/10/2016).  Family History: His family history includes Dementia in his other, other, and other; Diabetes type I in his brother; Prostate cancer in his other.  Social History: He  reports that he has never smoked. He has never used smokeless tobacco. He reports that he does not drink alcohol or use drugs.  Medications: Allergies as of 06/02/2017      Reactions   Sulfa Antibiotics Other (See Comments)   High potassium   Ramipril Cough   Versed [midazolam] Other (See Comments)   "I don't wake up very good or clear it out of my system"      Medication List        Accurate as of 06/02/17  3:42 PM. Always use your most recent med list.          acetaminophen 500 MG tablet Commonly known as:  TYLENOL Take 1,000 mg by mouth 2 (two) times daily.   acyclovir 400 MG tablet Commonly known as:  ZOVIRAX TAKE 1 TABLET (400 MG TOTAL) BY MOUTH 2 (TWO) TIMES DAILY.   AGAMATRIX ULTRA-THIN LANCETS Misc TEST BS 4 TIMES A DAY AND PRN. V40.98  aspirin 81 MG chewable tablet Chew 81 mg by mouth every morning.   benzonatate 100 MG capsule Commonly known as:  TESSALON Take 1 capsule (100 mg total) by mouth 3 (three) times daily as needed for cough.   cetirizine 10 MG tablet Commonly known as:  ZYRTEC Take 1 tablet (10 mg total) by mouth daily.   cyclobenzaprine 10 MG tablet Commonly known as:  FLEXERIL Take 1 tablet (10 mg total) by mouth 3 (three) times daily as needed for muscle spasms.   DULoxetine 60 MG capsule Commonly known as:  CYMBALTA Take 1  capsule (60 mg total) by mouth 2 (two) times daily. As directed   esomeprazole 40 MG capsule Commonly known as:  NEXIUM TAKE 1 CAPSULE BY MOUTH EVERY DAY   ferrous sulfate 325 (65 FE) MG tablet Take 650 mg by mouth daily with breakfast.   fludrocortisone 0.1 MG tablet Commonly known as:  FLORINEF Take 0.1 mg by mouth daily.   fluticasone 50 MCG/ACT nasal spray Commonly known as:  FLONASE Place 2 sprays into both nostrils daily.   FREESTYLE LIBRE READER Devi 1 applicator by Does not apply route 4 (four) times daily as needed.   FREESTYLE LIBRE SENSOR SYSTEM Misc Check BS QID and PRN.   furosemide 40 MG tablet Commonly known as:  LASIX Take 20 mg by mouth 2 (two) times daily.   GLUCOSAMINE 1500 COMPLEX PO Take 1 tablet by mouth 2 (two) times daily.   glucose blood test strip Commonly known as:  ONETOUCH VERIO TEST BLOOD SUGAR 4 TIMES DAILY AND AS NEEDED   HYDROcodone-homatropine 5-1.5 MG/5ML syrup Commonly known as:  HYCODAN Take 5 mLs by mouth every 6 (six) hours as needed for cough.   insulin lispro 100 UNIT/ML injection Commonly known as:  HUMALOG Use 42units to 120 units PER PUMP daily  AS DIRECTED   ipratropium 0.03 % nasal spray Commonly known as:  ATROVENT USE 2 SPRAY IN EACH NOSTRIL 2-3 TIMES DAILY   levothyroxine 175 MCG tablet Commonly known as:  SYNTHROID, LEVOTHROID TAKE 1 TABLET (175 MCG TOTAL) BY MOUTH DAILY BEFORE BREAKFAST.   lidocaine 2 % solution Commonly known as:  XYLOCAINE Gargle and spit out 10 mL every 4 hours as needed.   LORazepam 0.5 MG tablet Commonly known as:  ATIVAN Take 1 tablet (0.5 mg total) by mouth daily as needed.   metoCLOPramide 5 MG tablet Commonly known as:  REGLAN TAKE 1 TABLET BY MOUTH 3 TIMES A DAY BEFORE MEALS   niacin 1000 MG CR tablet Commonly known as:  NIASPAN TAKE 1 TABLET (1,000 MG TOTAL) BY MOUTH AT BEDTIME.   omega-3 acid ethyl esters 1 g capsule Commonly known as:  LOVAZA TAKE 2 CAPSULES (2 G TOTAL)  BY MOUTH 2 (TWO) TIMES DAILY.   ondansetron 4 MG tablet Commonly known as:  ZOFRAN TAKE 1 TABLET (4 MG TOTAL) BY MOUTH EVERY 8 (EIGHT) HOURS AS NEEDED FOR NAUSEA.   OVER THE COUNTER MEDICATION Take 1 capsule by mouth daily. Phillips- probiotic daily   simvastatin 40 MG tablet Commonly known as:  ZOCOR TAKE 1 TABLET (40 MG TOTAL) BY MOUTH AT BEDTIME.   Testosterone 20.25 MG/ACT (1.62%) Gel APPLY 3 PUMPS DAILY AS DIRECTED   traMADol 50 MG tablet Commonly known as:  ULTRAM Take 1 tablet (50 mg total) by mouth every 12 (twelve) hours as needed. for pain   TRIUMEQ 600-50-300 MG tablet Generic drug:  abacavir-dolutegravir-lamiVUDine TAKE 1 TABLET BY MOUTH DAILY   ULORIC 40  MG tablet Generic drug:  febuxostat TAKE 1 TABLET BY MOUTH EVERY DAY

## 2017-06-03 ENCOUNTER — Ambulatory Visit: Payer: Medicare Other

## 2017-06-29 ENCOUNTER — Other Ambulatory Visit: Payer: Self-pay | Admitting: Family Medicine

## 2017-06-30 ENCOUNTER — Other Ambulatory Visit: Payer: Self-pay | Admitting: Family Medicine

## 2017-07-01 DIAGNOSIS — Z794 Long term (current) use of insulin: Secondary | ICD-10-CM | POA: Diagnosis not present

## 2017-07-01 DIAGNOSIS — E109 Type 1 diabetes mellitus without complications: Secondary | ICD-10-CM | POA: Diagnosis not present

## 2017-07-06 ENCOUNTER — Other Ambulatory Visit: Payer: Self-pay | Admitting: Family Medicine

## 2017-07-06 DIAGNOSIS — IMO0002 Reserved for concepts with insufficient information to code with codable children: Secondary | ICD-10-CM

## 2017-07-06 DIAGNOSIS — E1065 Type 1 diabetes mellitus with hyperglycemia: Principal | ICD-10-CM

## 2017-07-06 DIAGNOSIS — E1022 Type 1 diabetes mellitus with diabetic chronic kidney disease: Secondary | ICD-10-CM

## 2017-07-06 DIAGNOSIS — N183 Chronic kidney disease, stage 3 (moderate): Principal | ICD-10-CM

## 2017-07-14 ENCOUNTER — Other Ambulatory Visit: Payer: Self-pay | Admitting: Family Medicine

## 2017-07-21 DIAGNOSIS — N183 Chronic kidney disease, stage 3 (moderate): Secondary | ICD-10-CM | POA: Diagnosis not present

## 2017-07-21 DIAGNOSIS — J9690 Respiratory failure, unspecified, unspecified whether with hypoxia or hypercapnia: Secondary | ICD-10-CM | POA: Diagnosis not present

## 2017-07-21 DIAGNOSIS — G4733 Obstructive sleep apnea (adult) (pediatric): Secondary | ICD-10-CM | POA: Diagnosis not present

## 2017-07-22 ENCOUNTER — Encounter: Payer: Self-pay | Admitting: Family Medicine

## 2017-07-22 ENCOUNTER — Ambulatory Visit (INDEPENDENT_AMBULATORY_CARE_PROVIDER_SITE_OTHER): Payer: Medicare Other | Admitting: Family Medicine

## 2017-07-22 VITALS — BP 134/91 | HR 71 | Temp 98.0°F | Ht 70.0 in | Wt 207.0 lb

## 2017-07-22 DIAGNOSIS — J0100 Acute maxillary sinusitis, unspecified: Secondary | ICD-10-CM

## 2017-07-22 MED ORDER — AMOXICILLIN-POT CLAVULANATE 875-125 MG PO TABS: 1.0000 | ORAL_TABLET | Freq: Two times a day (BID) | ORAL | 0 refills | Status: DC

## 2017-07-22 NOTE — Patient Instructions (Signed)

## 2017-07-22 NOTE — Progress Notes (Signed)
Subjective: CC: Sinus infection PCP: Chipper Herb, MD GYJ:EHUDJ Patrick Brown is a 47 y.o. male presenting to clinic today for:  1. Sinus infection? Patient reports onset of symptoms last Thursday.  He notes sneezing, headache, fatigue, sinus drainage.  Denies measured fevers, chills or night sweats.  No cough or shortness of breath.  Symptoms seem worse over the last 3 days.  He has been using his typical allergy medications which include Flonase, Atrovent nasal spray, Zyrtec 5 mg daily and humidification.  He has been using Tylenol to help with sinus headaches.  He notes that his blood sugars have also been rising into the 300s over the last few days.  He is concerned that he has a sinus infection, as this is how it typically presents.   ROS: Per HPI  Allergies  Allergen Reactions  . Sulfa Antibiotics Other (See Comments)    High potassium  . Ramipril Cough  . Versed [Midazolam] Other (See Comments)    "I don't wake up very good or clear it out of my system"   Past Medical History:  Diagnosis Date  . Anemia, iron deficiency On procrit  . CAP (community acquired pneumonia)   . CKD (chronic kidney disease) stage 3, GFR 30-59 ml/min ([facility])   . Degenerative arthritis   . Depression   . Dyslipidemia   . Gastroesophageal reflux disease   . Gastroparesis diabeticorum ([place])   . Hematuria, microscopic 10/09   work up negative (Dr. Amalia Hailey)  . HIV positive ([place])   . 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 Hypothyroidism   . IDDM (insulin dependent diabetes mellitus) ([place])    38 years  . Low HDL (under 40)   . Proteinuria   . Retinopathy    x2  . SIRS (systemic inflammatory response syndrome) ([facility])     Current Outpatient Medications:  .  acetaminophen (TYLENOL)  500 MG tablet, Take 1,000 mg by mouth 2 (two) times daily. , Disp: , Rfl:  .  acyclovir (ZOVIRAX) 400 MG tablet, TAKE 1 TABLET (400 MG TOTAL) BY MOUTH 2 (TWO) TIMES DAILY., Disp: , Rfl:  .  AGAMATRIX ULTRA-THIN LANCETS MISC, TEST BS 4 TIMES A DAY AND AS NEEDED E10.65, Disp: 400 each, Rfl: 3 .  aspirin 81 MG chewable tablet, Chew 81 mg by mouth every morning., Disp: , Rfl:  .  benzonatate (TESSALON) 100 MG capsule, Take 1 capsule (100 mg total) by mouth 3 (three) times daily as needed for cough., Disp: 20 capsule, Rfl: 0 .  cetirizine (ZYRTEC) 10 MG tablet, Take 1 tablet (10 mg total) by mouth daily., Disp: 30 tablet, Rfl: 11 .  Continuous Blood Gluc Receiver (FREESTYLE LIBRE READER) DEVI, 1 applicator by Does not apply route 4 (four) times daily as needed., Disp: 1 Device, Rfl: 1 .  Continuous Blood Gluc Sensor (FREESTYLE LIBRE SENSOR SYSTEM) MISC, Check BS QID and PRN., Disp: 3 each, Rfl: 11 .  cyclobenzaprine (FLEXERIL) 10 MG tablet, Take 1 tablet (10 mg total) by mouth 3 (three) times daily as needed for muscle spasms., Disp: 30 tablet, Rfl: 0 .  DULoxetine (CYMBALTA) 60 MG capsule, TAKE 1 CAPSULE (60 MG TOTAL) BY MOUTH 2 (TWO) TIMES DAILY. AS DIRECTED, Disp: 180 capsule, Rfl: 1 .  esomeprazole (NEXIUM) 40 MG capsule, TAKE 1 CAPSULE BY MOUTH EVERY DAY,  Disp: 90 capsule, Rfl: 0 .  ferrous sulfate 325 (65 FE) MG tablet, Take 650 mg by mouth daily with breakfast. , Disp: , Rfl:  .  fludrocortisone (FLORINEF) 0.1 MG tablet, Take 0.1 mg by mouth daily., Disp: , Rfl:  .  fluticasone (FLONASE) 50 MCG/ACT nasal spray, Place 2 sprays into both nostrils daily., Disp: 16 g, Rfl: 11 .  furosemide (LASIX) 40 MG tablet, Take 20 mg by mouth 2 (two) times daily., Disp: , Rfl: 4 .  Glucosamine-Chondroit-Vit C-Mn (GLUCOSAMINE 1500 COMPLEX PO), Take 1 tablet by mouth 2 (two) times daily. , Disp: , Rfl:  .  glucose blood (ONETOUCH VERIO) test strip, TEST BLOOD SUGAR 4 TIMES DAILY AND AS NEEDED, Disp: 300 each, Rfl:  4 .  HYDROcodone-homatropine (HYCODAN) 5-1.5 MG/5ML syrup, Take 5 mLs by mouth every 6 (six) hours as needed for cough., Disp: 240 mL, Rfl: 0 .  insulin lispro (HUMALOG) 100 UNIT/ML injection, USE 42UNITS TO 120 UNITS PER PUMP DAILY AS DIRECTED, Disp: 60 mL, Rfl: 0 .  ipratropium (ATROVENT) 0.03 % nasal spray, USE 2 SPRAY IN EACH NOSTRIL 2-3 TIMES DAILY, Disp: , Rfl: 1 .  levothyroxine (SYNTHROID, LEVOTHROID) 175 MCG tablet, TAKE 1 TABLET (175 MCG TOTAL) BY MOUTH DAILY BEFORE BREAKFAST., Disp: 30 tablet, Rfl: 11 .  lidocaine (XYLOCAINE) 2 % solution, Gargle and spit out 10 mL every 4 hours as needed., Disp: 100 mL, Rfl: 0 .  LORazepam (ATIVAN) 0.5 MG tablet, Take 1 tablet (0.5 mg total) by mouth daily as needed., Disp: 30 tablet, Rfl: 5 .  metoCLOPramide (REGLAN) 5 MG tablet, TAKE 1 TABLET BY MOUTH 3 TIMES A DAY BEFORE MEALS, Disp: 90 tablet, Rfl: 4 .  niacin (NIASPAN) 1000 MG CR tablet, TAKE 1 TABLET (1,000 MG TOTAL) BY MOUTH AT BEDTIME. (Patient taking differently: TAKE 1 TABLET (1,500 MG TOTAL) BY MOUTH AT BEDTIME.), Disp: 30 tablet, Rfl: 5 .  omega-3 acid ethyl esters (LOVAZA) 1 g capsule, TAKE 2 CAPSULES (2 G TOTAL) BY MOUTH 2 (TWO) TIMES DAILY., Disp: 360 capsule, Rfl: 1 .  ondansetron (ZOFRAN) 4 MG tablet, TAKE 1 TABLET (4 MG TOTAL) BY MOUTH EVERY 8 (EIGHT) HOURS AS NEEDED FOR NAUSEA., Disp: 45 tablet, Rfl: 0 .  OVER THE COUNTER MEDICATION, Take 1 capsule by mouth daily. Phillips- probiotic daily, Disp: , Rfl:  .  simvastatin (ZOCOR) 40 MG tablet, TAKE 1 TABLET (40 MG TOTAL) BY MOUTH AT BEDTIME., Disp: 90 tablet, Rfl: 1 .  Testosterone 20.25 MG/ACT (1.62%) GEL, APPLY 3 PUMPS DAILY AS DIRECTED, Disp: 150 g, Rfl: 3 .  traMADol (ULTRAM) 50 MG tablet, Take 1 tablet (50 mg total) by mouth every 12 (twelve) hours as needed. for pain, Disp: 60 tablet, Rfl: 0 .  TRIUMEQ 600-50-300 MG tablet, TAKE 1 TABLET BY MOUTH DAILY, Disp: 30 tablet, Rfl: 5 .  ULORIC 40 MG tablet, TAKE 1 TABLET BY MOUTH EVERY DAY,  Disp: 90 tablet, Rfl: 1 Social History   Socioeconomic History  . Marital status: Single    Spouse name: Not on file  . Number of children: 0  . Years of education: AAS  . Highest education level: Not on file  Occupational History  . Occupation: EMT    Employer: [place]: [employer]  Social Needs  . Financial resource strain: Not hard at all  . Food insecurity:    Worry: Never true    Inability: Never true  . Transportation needs:    Medical: No  Non-medical: No  Tobacco Use  . Smoking status: Never Smoker  . Smokeless tobacco: Never Used  Substance and Sexual Activity  . Alcohol use: No    Alcohol/week: 0.0 oz    Comment: Infrequent, less than 1 x per month.  . Drug use: No  . Sexual activity: Not Currently    Comment: declined condoms  Lifestyle  . Physical activity:    Days per week: 0 days    Minutes per session: 0 min  . Stress: Not at all  Relationships  . Social connections:    Talks on phone: More than three times a week    Gets together: More than three times a week    Attends religious service: More than 4 times per year    Active member of club or organization: Yes    Attends meetings of clubs or organizations: More than 4 times per year    Relationship status: Never married  . Intimate partner violence:    Fear of current or ex partner: No    Emotionally abused: No    Physically abused: No    Forced sexual activity: No  Other Topics Concern  . Not on file  Social History Narrative   Single.  Lives at home with mom.  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    Family History  Problem Relation Age of Onset  . Diabetes type I Brother   . Prostate cancer Other   . Dementia Other   . Dementia Other   . Dementia Other     Objective: Office vital signs reviewed. BP (!) 134/91   Pulse 71   Temp 98 F (36.7 C) (Oral)   Ht 5\' 10"  (1.778 m)   Wt 207 lb (93.9 kg)   SpO2 98%   BMI 29.70 kg/m   Physical Examination:  General:  Awake, alert, nontoxic appearing, No acute distress HEENT: +maxillary sinus TTP    Neck: No masses palpated. No lymphadenopathy    Ears: Tympanic membranes intact, normal light reflex, no erythema, no bulging    Eyes: PERRLA, extraocular membranes intact, sclera white    Nose: nasal turbinates moist, clear nasal discharge    Throat: moist mucus membranes, no erythema, no tonsillar exudate.  Airway is patent Cardio: regular rate and rhythm, S1S2 heard, no murmurs appreciated Pulm: clear to auscultation bilaterally, no wheezes, rhonchi or rales; normal work of breathing on room air  Assessment/ Plan: 47 y.o. male   1. Acute non-recurrent maxillary sinusitis Patient is afebrile and nontoxic-appearing on exam.  He has normal oxygenation on room air.  Physical exam was remarkable for maxillary sinus tenderness to palpation.  Given HIV status and worsening of sinus symptoms, will empirically treat for acute maxillary sinusitis with Augmentin 875 p.o. twice daily.  Renal function was reviewed.  GFR is greater than 30 so no amoxicillin adjustment needed.  He will follow-up if symptoms are worsening.  May continue current antihistamine regimen.  Meds ordered this encounter  Medications  . amoxicillin-clavulanate (AUGMENTIN) 875-125 MG tablet    Sig: Take 1 tablet by mouth 2 (two) times daily.    Dispense:  20 tablet    Refill:  [place], DO [place] [phone]

## 2017-07-24 ENCOUNTER — Other Ambulatory Visit: Payer: Self-pay | Admitting: Family

## 2017-07-24 ENCOUNTER — Encounter: Payer: Self-pay | Admitting: Family Medicine

## 2017-07-25 ENCOUNTER — Encounter: Payer: Self-pay | Admitting: Family Medicine

## 2017-07-28 ENCOUNTER — Encounter: Payer: Self-pay | Admitting: *Deleted

## 2017-08-11 ENCOUNTER — Other Ambulatory Visit: Payer: Self-pay | Admitting: Family Medicine

## 2017-08-18 ENCOUNTER — Ambulatory Visit: Payer: Medicare Other

## 2017-08-23 ENCOUNTER — Other Ambulatory Visit: Payer: Self-pay | Admitting: Family Medicine

## 2017-08-24 ENCOUNTER — Other Ambulatory Visit: Payer: Self-pay | Admitting: Family Medicine

## 2017-08-26 ENCOUNTER — Encounter: Payer: Self-pay | Admitting: Internal Medicine

## 2017-08-28 ENCOUNTER — Other Ambulatory Visit: Payer: Self-pay | Admitting: Family Medicine

## 2017-09-03 ENCOUNTER — Ambulatory Visit (INDEPENDENT_AMBULATORY_CARE_PROVIDER_SITE_OTHER): Payer: Medicare Other | Admitting: Family Medicine

## 2017-09-03 ENCOUNTER — Encounter: Payer: Self-pay | Admitting: Family Medicine

## 2017-09-03 VITALS — BP 142/88 | HR 79 | Temp 97.5°F | Ht 70.0 in | Wt 212.0 lb

## 2017-09-03 DIAGNOSIS — E349 Endocrine disorder, unspecified: Secondary | ICD-10-CM

## 2017-09-03 DIAGNOSIS — E1022 Type 1 diabetes mellitus with diabetic chronic kidney disease: Secondary | ICD-10-CM

## 2017-09-03 DIAGNOSIS — E1065 Type 1 diabetes mellitus with hyperglycemia: Secondary | ICD-10-CM | POA: Diagnosis not present

## 2017-09-03 DIAGNOSIS — E559 Vitamin D deficiency, unspecified: Secondary | ICD-10-CM

## 2017-09-03 DIAGNOSIS — N183 Chronic kidney disease, stage 3 (moderate): Secondary | ICD-10-CM

## 2017-09-03 DIAGNOSIS — E7800 Pure hypercholesterolemia, unspecified: Secondary | ICD-10-CM | POA: Diagnosis not present

## 2017-09-03 DIAGNOSIS — I10 Essential (primary) hypertension: Secondary | ICD-10-CM

## 2017-09-03 DIAGNOSIS — D509 Iron deficiency anemia, unspecified: Secondary | ICD-10-CM

## 2017-09-03 DIAGNOSIS — IMO0002 Reserved for concepts with insufficient information to code with codable children: Secondary | ICD-10-CM

## 2017-09-03 DIAGNOSIS — N400 Enlarged prostate without lower urinary tract symptoms: Secondary | ICD-10-CM

## 2017-09-03 DIAGNOSIS — E1159 Type 2 diabetes mellitus with other circulatory complications: Secondary | ICD-10-CM

## 2017-09-03 LAB — BAYER DCA HB A1C WAIVED: HB A1C (BAYER DCA - WAIVED): 7.4 % — ABNORMAL HIGH (ref <7.0)

## 2017-09-03 NOTE — Patient Instructions (Addendum)
Medicare Annual Wellness Visit  [facility] and the medical providers at [facility] strive to bring you the best medical care.  In doing so we not only want to address your current medical conditions and concerns but also to detect new conditions early and prevent illness, disease and health-related problems.    Medicare offers a yearly Wellness Visit which allows our clinical staff to assess your need for preventative services including immunizations, lifestyle education, counseling to decrease risk of preventable diseases and screening for fall risk and other medical concerns.    This visit is provided free of charge (no copay) for all Medicare recipients. The clinical pharmacists at [facility] have begun to conduct these Wellness Visits which will also include a thorough review of all your medications.    As you primary medical provider recommend that you make an appointment for your Annual Wellness Visit if you have not done so already this year.  You may set up this appointment before you leave today or you may call back ([phone]) and schedule an appointment.  Please make sure when you call that you mention that you are scheduling your Annual Wellness Visit with the clinical pharmacist so that the appointment may be made for the proper length of time.     Continue current medications. Continue good therapeutic lifestyle changes which include good diet and exercise. Fall precautions discussed with patient. If an FOBT was given today- please return it to our front desk. If you are over 71 years old - you may need Prevnar 31 or the adult Pneumonia vaccine.  **Flu shots are available--- please call and schedule a FLU-CLINIC appointment**  After your visit with [place] today you will receive a survey in the mail or online from [employer] regarding your care with [place]. Please take a moment to fill this out. Your feedback is very  important to [place] as you can help [place] better understand your patient needs as well as improve your experience and satisfaction. WE CARE ABOUT YOU!!!   Continue to follow-up regularly with infectious disease and Dr. Linus Salmons Continue to follow-up regularly with Dr. Antionette Fairy who is his nephrologist Patient has an upcoming appointment with an endocrinologist at [facility]. He also understands the importance of getting his eye exam from Dr. Zadie Rhine on a yearly basis. Please return the FOBT. Please record blood pressures at home and bring these by for review and or take them to the nephrologist at your next visit.

## 2017-09-03 NOTE — Progress Notes (Signed)
Subjective:    Patient ID: Patrick Brown, male    DOB: [birth year], 47 y.o.   MRN: [id number]  HPI Pt here for follow up and management of chronic medical problems which includes diabetes and hyperlipidemia. He is taking medication regularly.  As of note, the patient has seen his nephrologist who agrees with replacing vitamin D.  The patient is due for a PSA and prostate today.  He will get lab work today and a urinalysis today.  His blood pressure repeat is still slightly elevated at 142/88.  Patient today is pleasant and alert and looks and feels great.  He is going to be seeing an endocrinologist at [facility] soon because of the previous one that he was seeing has retired.  He continues to see the infectious disease physician in [place] every 9 months and his nephrologist in [place] every 6 months.  He keeps up with these appointments.  The nephrologist agreed that it was okay to put him on some vitamin D if his vitamin D level is still low.  The patient today denies any chest pain or increased shortness of breath.  He is active physically and feels well.  He denies any trouble with his stomach including nausea vomiting diarrhea blood in the stool or black tarry bowel movements or change in bowel habits.  He has an FOBT at home which he has not returned but plans to return this.  He is passing his water without problems.  His yearly eye exam he believes is coming up sometime this fall with Dr. Zadie Rhine.  The blood sugars at home have been running fasting in the 1 40-1 50 range.  He has not seen the cardiologist in a while but is not having any discomfort in some time in the next year we will make sure he has a return appointment with cardiology because of his multiple risk factors for heart disease.  He will be due to get a colonoscopy when he turns 73.    Patient Active Problem List   Diagnosis Date Noted  . Hyperkalemia 03/08/2017  . Non-seasonal allergic  rhinitis due to pollen 11/06/2016  . S/P ORIF (open reduction internal fixation) fracture   . Closed right hip fracture, initial encounter ([place]) 05/09/2016  . Benign prostatic hyperplasia without lower urinary tract symptoms 04/18/2016  . Vitamin D deficiency 04/18/2016  . OSA (obstructive sleep apnea) 10/06/2014  . Medicare annual wellness visit, initial 12/23/2013  . Pseudophakia of both eyes 06/23/2013  . Pain in joint, lower leg 11/19/2012  . Unspecified sinusitis (chronic) 09/16/2012  . Right groin wound 08/09/2012  . HIV disease ([place]) 06/02/2012  . Convulsions/seizures ([place]) 05/16/2012  . Pulmonary infiltrate 04/18/2012  . Gastroparesis 04/18/2012  . Anemia, iron deficiency   . Type 1 diabetes mellitus ([place]) 05/08/2011  . Acquired hypothyroidism 05/08/2011  . Chronic kidney disease, stage 3, mod decreased GFR ([facility]) 12/31/2010   Outpatient Encounter Medications as of 09/03/2017  Medication Sig  . acetaminophen (TYLENOL) 500 MG tablet Take 1,000 mg by mouth 2 (two) times daily.   Marland Kitchen acyclovir (ZOVIRAX) 400 MG tablet TAKE 1 TABLET (400 MG TOTAL) BY MOUTH 2 (TWO) TIMES DAILY.  Marland Kitchen AGAMATRIX ULTRA-THIN LANCETS MISC TEST BS 4 TIMES A DAY AND AS NEEDED E10.65  . amLODipine (NORVASC) 5 MG tablet Take 5 mg by mouth daily.  Marland Kitchen aspirin 81 MG chewable tablet Chew 81 mg by mouth every morning.  . cetirizine (ZYRTEC) 10 MG tablet Take 1  tablet (10 mg total) by mouth daily.  . cyclobenzaprine (FLEXERIL) 10 MG tablet Take 1 tablet (10 mg total) by mouth 3 (three) times daily as needed for muscle spasms.  . DULoxetine (CYMBALTA) 60 MG capsule TAKE 1 CAPSULE (60 MG TOTAL) BY MOUTH 2 (TWO) TIMES DAILY. AS DIRECTED  . esomeprazole (NEXIUM) 40 MG capsule TAKE 1 CAPSULE BY MOUTH EVERY DAY  . ferrous sulfate 325 (65 FE) MG tablet Take 650 mg by mouth daily with breakfast.   . fludrocortisone (FLORINEF) 0.1 MG tablet Take 0.1 mg by mouth daily.  . fluticasone (FLONASE) 50 MCG/ACT nasal spray Place 2 sprays  into both nostrils daily.  . furosemide (LASIX) 40 MG tablet Take 20 mg by mouth 2 (two) times daily.  . Glucosamine-Chondroit-Vit C-Mn (GLUCOSAMINE 1500 COMPLEX PO) Take 1 tablet by mouth 2 (two) times daily.   Marland Kitchen glucose blood (ONETOUCH VERIO) test strip TEST BLOOD SUGAR 4 TIMES DAILY AND AS NEEDED  . HYDROcodone-homatropine (HYCODAN) 5-1.5 MG/5ML syrup Take 5 mLs by mouth every 6 (six) hours as needed for cough.  . insulin lispro (HUMALOG) 100 UNIT/ML injection USE 42UNITS TO 120 UNITS PER PUMP DAILY AS DIRECTED  . ipratropium (ATROVENT) 0.03 % nasal spray USE 2 SPRAY IN EACH NOSTRIL 2-3 TIMES DAILY  . levothyroxine (SYNTHROID, LEVOTHROID) 175 MCG tablet TAKE 1 TABLET (175 MCG TOTAL) BY MOUTH DAILY BEFORE BREAKFAST.  Marland Kitchen LORazepam (ATIVAN) 0.5 MG tablet Take 1 tablet (0.5 mg total) by mouth daily as needed.  . metoCLOPramide (REGLAN) 5 MG tablet TAKE 1 TABLET BY MOUTH 3 TIMES A DAY BEFORE MEALS  . niacin (NIASPAN) 1000 MG CR tablet TAKE 1 TABLET (1,000 MG TOTAL) BY MOUTH AT BEDTIME. (Patient taking differently: TAKE 1 TABLET (1,500 MG TOTAL) BY MOUTH AT BEDTIME.)  . omega-3 acid ethyl esters (LOVAZA) 1 g capsule TAKE 2 CAPSULES (2 G TOTAL) BY MOUTH 2 (TWO) TIMES DAILY.  Marland Kitchen ondansetron (ZOFRAN) 4 MG tablet TAKE 1 TABLET (4 MG TOTAL) BY MOUTH EVERY 8 (EIGHT) HOURS AS NEEDED FOR NAUSEA.  Marland Kitchen OVER THE COUNTER MEDICATION Take 1 capsule by mouth daily. [name]- probiotic daily  . simvastatin (ZOCOR) 40 MG tablet TAKE 1 TABLET (40 MG TOTAL) BY MOUTH AT BEDTIME.  Marland Kitchen Testosterone 20.25 MG/ACT (1.62%) GEL APPLY 3 PUMPS DAILY AS DIRECTED  . traMADol (ULTRAM) 50 MG tablet Take 1 tablet (50 mg total) by mouth every 12 (twelve) hours as needed. for pain  . TRIUMEQ 600-50-300 MG tablet TAKE 1 TABLET BY MOUTH DAILY  . ULORIC 40 MG tablet TAKE 1 TABLET BY MOUTH EVERY DAY  . [DISCONTINUED] benzonatate (TESSALON) 100 MG capsule Take 1 capsule (100 mg total) by mouth 3 (three) times daily as needed for cough.  .  [DISCONTINUED] Continuous Blood Gluc Receiver (FREESTYLE LIBRE READER) DEVI 1 applicator by Does not apply route 4 (four) times daily as needed.  . [DISCONTINUED] Continuous Blood Gluc Sensor (FREESTYLE LIBRE SENSOR SYSTEM) MISC Check BS QID and PRN.  . [DISCONTINUED] amoxicillin-clavulanate (AUGMENTIN) 875-125 MG tablet Take 1 tablet by mouth 2 (two) times daily.  . [DISCONTINUED] lidocaine (XYLOCAINE) 2 % solution Gargle and spit out 10 mL every 4 hours as needed.  . [DISCONTINUED] Testosterone 20.25 MG/ACT (1.62%) GEL APPLY 2 PUMPS DAILY AS DIRECTED   No facility-administered encounter medications on file as of 09/03/2017.      Review of Systems  Constitutional: Negative.   HENT: Negative.   Eyes: Negative.   Respiratory: Negative.   Cardiovascular: Negative.   Gastrointestinal:  Negative.   Endocrine: Negative.   Genitourinary: Negative.   Musculoskeletal: Negative.   Skin: Negative.   Allergic/Immunologic: Negative.   Neurological: Negative.   Hematological: Negative.   Psychiatric/Behavioral: Negative.        Objective:   Physical Exam  Constitutional: He is oriented to person, place, and time. He appears well-developed and well-nourished. No distress.  Patient is pleasant and relaxed and looks great and feels good.  HENT:  Head: Normocephalic and atraumatic.  Right Ear: External ear normal.  Left Ear: External ear normal.  Nose: Nose normal.  Mouth/Throat: Oropharynx is clear and moist. No oropharyngeal exudate.  Eyes: Pupils are equal, round, and reactive to light. Conjunctivae and EOM are normal. Right eye exhibits no discharge. Left eye exhibits no discharge. No scleral icterus.  Eye exam with ophthalmologist is planned soon  Neck: Normal range of motion. Neck supple. No thyromegaly present.  No bruits thyromegaly or anterior cervical adenopathy  Cardiovascular: Normal rate, regular rhythm, normal heart sounds and intact distal pulses.  No murmur heard. Heart has a  regular rate and rhythm at 72/min  Pulmonary/Chest: Effort normal and breath sounds normal. He has no wheezes. He has no rales. He exhibits no tenderness.  Clear anteriorly and posteriorly.  No axillary adenopathy  Abdominal: Soft. Bowel sounds are normal. He exhibits no mass. There is no tenderness.  No abdominal tenderness masses organ enlargement bruits or inguinal adenopathy  Genitourinary: Rectum normal and penis normal.  Genitourinary Comments: The prostate is slightly enlarged without lumps or masses.  There were no rectal masses.  The external genitalia were within normal limits and no inguinal hernias were palpated.  Musculoskeletal: Normal range of motion. He exhibits no edema.  Lymphadenopathy:    He has no cervical adenopathy.  Neurological: He is alert and oriented to person, place, and time. He has normal reflexes. No cranial nerve deficit.  Skin: Skin is warm and dry. No rash noted.  Psychiatric: He has a normal mood and affect. His behavior is normal. Judgment and thought content normal.  The patient's mood affect and behavior are all normal for him.  Nursing note and vitals reviewed.  BP (!) 153/99 (BP Location: Right Arm)   Pulse 79   Temp (!) 97.5 F (36.4 C) (Oral)   Ht '5\' 10"'$  (1.778 m)   Wt 212 lb (96.2 kg)   BMI 30.42 kg/m         Assessment & Plan:  1. Uncontrolled type 1 diabetes mellitus with stage 3 chronic kidney disease ([place]) -Continue to monitor blood sugars closely and follow-up with nephrology regularly - BMP8+EGFR - CBC with Differential/Platelet - Bayer DCA Hb A1c Waived  2. Vitamin D deficiency -Okay with nephrology to start vitamin D once the level is back - CBC with Differential/Platelet - VITAMIN D 25 Hydroxy (Vit-D Deficiency, Fractures)  3. Pure hypercholesterolemia -Continue current treatment and as aggressive therapeutic lifestyle changes as possible to get cholesterol to goal. - CBC with Differential/Platelet - Lipid panel -  Hepatic function panel  4. Testosterone deficiency -Continue current treatment pending results of lab work - CBC with Differential/Platelet - Testosterone,Free and Total - PSA, total and free - Urinalysis, Complete  5. Benign prostatic hyperplasia without lower urinary tract symptoms - CBC with Differential/Platelet - Testosterone,Free and Total - PSA, total and free - Urinalysis, Complete  6. Iron deficiency anemia, unspecified iron deficiency anemia type - CBC with Differential/Platelet  7.  Hypertension associated with diabetes -Blood pressure is elevated systolic today.  The patient will monitor blood pressures at home and bring readings by for review in 4 weeks.   Patient Instructions                       Medicare Annual Wellness Visit  [facility] and the medical providers at [facility] strive to bring you the best medical care.  In doing so we not only want to address your current medical conditions and concerns but also to detect new conditions early and prevent illness, disease and health-related problems.    Medicare offers a yearly Wellness Visit which allows our clinical staff to assess your need for preventative services including immunizations, lifestyle education, counseling to decrease risk of preventable diseases and screening for fall risk and other medical concerns.    This visit is provided free of charge (no copay) for all Medicare recipients. The clinical pharmacists at [facility] have begun to conduct these Wellness Visits which will also include a thorough review of all your medications.    As you primary medical provider recommend that you make an appointment for your Annual Wellness Visit if you have not done so already this year.  You may set up this appointment before you leave today or you may call back ([phone]) and schedule an appointment.  Please make sure when you call that you mention that you are  scheduling your Annual Wellness Visit with the clinical pharmacist so that the appointment may be made for the proper length of time.     Continue current medications. Continue good therapeutic lifestyle changes which include good diet and exercise. Fall precautions discussed with patient. If an FOBT was given today- please return it to our front desk. If you are over 58 years old - you may need Prevnar 60 or the adult Pneumonia vaccine.  **Flu shots are available--- please call and schedule a FLU-CLINIC appointment**  After your visit with [place] today you will receive a survey in the mail or online from [employer] regarding your care with [place]. Please take a moment to fill this out. Your feedback is very important to [place] as you can help [place] better understand your patient needs as well as improve your experience and satisfaction. WE CARE ABOUT YOU!!!   Continue to follow-up regularly with infectious disease and Dr. Linus Salmons Continue to follow-up regularly with Dr. Antionette Fairy who is his nephrologist Patient has an upcoming appointment with an endocrinologist at [facility]. He also understands the importance of getting his eye exam from Dr. Zadie Rhine on a yearly basis. Please return the FOBT. Please record blood pressures at home and bring these by for review and or take them to the nephrologist at your next visit.  Arrie Senate MD

## 2017-09-04 LAB — LIPID PANEL
CHOLESTEROL TOTAL: 138 mg/dL (ref 100–199)
Chol/HDL Ratio: 3.5 ratio (ref 0.0–5.0)
HDL: 40 mg/dL (ref >39)
LDL Calculated: 73 mg/dL (ref 0–99)
TRIGLYCERIDES: 126 mg/dL (ref 0–149)
VLDL Cholesterol Cal: 25 mg/dL (ref 5–40)

## 2017-09-04 LAB — CBC WITH DIFFERENTIAL/PLATELET
BASOS: 1 %
Basophils Absolute: 0.1 10*3/uL (ref 0.0–0.2)
EOS (ABSOLUTE): 0.3 10*3/uL (ref 0.0–0.4)
Eos: 4 %
Hematocrit: 36.1 % — ABNORMAL LOW (ref 37.5–51.0)
Hemoglobin: 12.1 g/dL — ABNORMAL LOW (ref 13.0–17.7)
Immature Grans (Abs): 0 10*3/uL (ref 0.0–0.1)
Immature Granulocytes: 1 %
Lymphocytes Absolute: 2.1 10*3/uL (ref 0.7–3.1)
Lymphs: 29 %
MCH: 32.8 pg (ref 26.6–33.0)
MCHC: 33.5 g/dL (ref 31.5–35.7)
MCV: 98 fL — AB (ref 79–97)
MONOCYTES: 10 %
MONOS ABS: 0.7 10*3/uL (ref 0.1–0.9)
NEUTROS ABS: 4.1 10*3/uL (ref 1.4–7.0)
Neutrophils: 55 %
PLATELETS: 218 10*3/uL (ref 150–450)
RBC: 3.69 x10E6/uL — ABNORMAL LOW (ref 4.14–5.80)
RDW: 13.7 % (ref 12.3–15.4)
WBC: 7.3 10*3/uL (ref 3.4–10.8)

## 2017-09-04 LAB — BMP8+EGFR
BUN / CREAT RATIO: 15 (ref 9–20)
BUN: 29 mg/dL — AB (ref 6–24)
CO2: 23 mmol/L (ref 20–29)
CREATININE: 1.97 mg/dL — AB (ref 0.76–1.27)
Calcium: 9.3 mg/dL (ref 8.7–10.2)
Chloride: 97 mmol/L (ref 96–106)
GFR, EST AFRICAN AMERICAN: 45 mL/min/{1.73_m2} — AB (ref >59)
GFR, EST NON AFRICAN AMERICAN: 39 mL/min/{1.73_m2} — AB (ref >59)
GLUCOSE: 271 mg/dL — AB (ref 65–99)
Potassium: 4.1 mmol/L (ref 3.5–5.2)
SODIUM: 136 mmol/L (ref 134–144)

## 2017-09-04 LAB — URINALYSIS, COMPLETE
BILIRUBIN UA: NEGATIVE
KETONES UA: NEGATIVE
LEUKOCYTES UA: NEGATIVE
Nitrite, UA: NEGATIVE
RBC UA: NEGATIVE
SPEC GRAV UA: 1.016 (ref 1.005–1.030)
Urobilinogen, Ur: 0.2 mg/dL (ref 0.2–1.0)
pH, UA: 5 (ref 5.0–7.5)

## 2017-09-04 LAB — TESTOSTERONE,FREE AND TOTAL
Testosterone, Free: 8 pg/mL (ref 6.8–21.5)
Testosterone: 453 ng/dL (ref 264–916)

## 2017-09-04 LAB — MICROSCOPIC EXAMINATION
Bacteria, UA: NONE SEEN
Casts: NONE SEEN /lpf
EPITHELIAL CELLS (NON RENAL): NONE SEEN /HPF (ref 0–10)
WBC, UA: NONE SEEN /hpf (ref 0–5)

## 2017-09-04 LAB — HEPATIC FUNCTION PANEL
ALT: 27 IU/L (ref 0–44)
AST: 25 IU/L (ref 0–40)
Albumin: 4.2 g/dL (ref 3.5–5.5)
Alkaline Phosphatase: 102 IU/L (ref 39–117)
BILIRUBIN TOTAL: 0.4 mg/dL (ref 0.0–1.2)
BILIRUBIN, DIRECT: 0.12 mg/dL (ref 0.00–0.40)
Total Protein: 6.7 g/dL (ref 6.0–8.5)

## 2017-09-04 LAB — PSA, TOTAL AND FREE
PSA FREE PCT: 38 %
PSA FREE: 0.19 ng/mL
Prostate Specific Ag, Serum: 0.5 ng/mL (ref 0.0–4.0)

## 2017-09-04 LAB — VITAMIN D 25 HYDROXY (VIT D DEFICIENCY, FRACTURES): Vit D, 25-Hydroxy: 39.1 ng/mL (ref 30.0–100.0)

## 2017-09-09 ENCOUNTER — Other Ambulatory Visit: Payer: Self-pay | Admitting: Family Medicine

## 2017-09-16 ENCOUNTER — Other Ambulatory Visit: Payer: Self-pay | Admitting: Family Medicine

## 2017-09-16 NOTE — Telephone Encounter (Signed)
No thyroid level since 09/04/2016

## 2017-09-18 ENCOUNTER — Other Ambulatory Visit: Payer: Self-pay | Admitting: Family Medicine

## 2017-09-28 ENCOUNTER — Other Ambulatory Visit: Payer: Self-pay | Admitting: Family Medicine

## 2017-10-09 DIAGNOSIS — E109 Type 1 diabetes mellitus without complications: Secondary | ICD-10-CM | POA: Diagnosis not present

## 2017-10-14 ENCOUNTER — Other Ambulatory Visit: Payer: Self-pay | Admitting: Family Medicine

## 2017-10-23 ENCOUNTER — Other Ambulatory Visit: Payer: Self-pay | Admitting: Internal Medicine

## 2017-10-27 ENCOUNTER — Other Ambulatory Visit (HOSPITAL_COMMUNITY)
Admission: RE | Admit: 2017-10-27 | Discharge: 2017-10-27 | Disposition: A | Discharge status: Home / Self Care | Payer: Medicare Other | Source: Ambulatory Visit | Attending: Internal Medicine | Admitting: Internal Medicine

## 2017-10-27 ENCOUNTER — Encounter: Payer: Self-pay | Admitting: Internal Medicine

## 2017-10-27 ENCOUNTER — Ambulatory Visit: Payer: Medicare Other | Admitting: Internal Medicine

## 2017-10-27 VITALS — BP 166/84 | HR 76 | Temp 97.5°F | Wt 213.8 lb

## 2017-10-27 DIAGNOSIS — B20 Human immunodeficiency virus [HIV] disease: Secondary | ICD-10-CM

## 2017-10-27 DIAGNOSIS — Z23 Encounter for immunization: Secondary | ICD-10-CM | POA: Insufficient documentation

## 2017-10-27 DIAGNOSIS — Z113 Encounter for screening for infections with a predominantly sexual mode of transmission: Secondary | ICD-10-CM | POA: Insufficient documentation

## 2017-10-27 DIAGNOSIS — D649 Anemia, unspecified: Secondary | ICD-10-CM | POA: Diagnosis not present

## 2017-10-27 MED ORDER — ABACAVIR-DOLUTEGRAVIR-LAMIVUD 600-50-300 MG PO TABS: 1.0000 | ORAL_TABLET | Freq: Every day | ORAL | 11 refills | Status: DC

## 2017-10-27 NOTE — Progress Notes (Signed)
   Subjective:    Patient ID: Patrick Brown, male    DOB: Sep 20, 1970, 47 y.o.   MRN: [id number]  HPI Here for follow up of HIV Continues on Triumeq and denies any missed doses.  Recent creat noted and stable at 1.97.  Feels well.     Review of Systems  Constitutional: Negative for fatigue and fever.  Gastrointestinal: Negative for diarrhea.  Skin: Negative for rash.  Neurological: Negative for dizziness.       Objective:   Physical Exam  Constitutional: He appears well-developed and well-nourished. No distress.  HENT:  Mouth/Throat: No oropharyngeal exudate.  Eyes: No scleral icterus.  Cardiovascular: Normal rate, regular rhythm and normal heart sounds.  No murmur heard. Pulmonary/Chest: Effort normal and breath sounds normal. No respiratory distress.  Skin: No rash noted.          Assessment & Plan:

## 2017-10-27 NOTE — Assessment & Plan Note (Signed)
Stable creat.  No dose adjust of Triumeq indicated until GFR stays below 30.

## 2017-10-27 NOTE — Assessment & Plan Note (Signed)
Doing well, no missed doses.  At this point, he is very compliant so can rtc in 1 year.  Refills provided.

## 2017-10-27 NOTE — Assessment & Plan Note (Signed)
Discussed Prevnar and given today Pneumovax again next year Flu shot given

## 2017-10-28 LAB — URINE CYTOLOGY ANCILLARY ONLY
CHLAMYDIA, DNA PROBE: NEGATIVE
Neisseria Gonorrhea: NEGATIVE

## 2017-10-28 LAB — T-HELPER CELL (CD4) - (RCID CLINIC ONLY)
CD4 T CELL ABS: 350 /uL — AB (ref 400–2700)
CD4 T CELL HELPER: 14 % — AB (ref 33–55)

## 2017-10-29 LAB — FLUORESCENT TREPONEMAL AB(FTA)-IGG-BLD: Fluorescent Treponemal ABS: REACTIVE — AB

## 2017-10-29 LAB — HIV-1 RNA QUANT-NO REFLEX-BLD
HIV 1 RNA QUANT: NOT DETECTED {copies}/mL
HIV-1 RNA Quant, Log: <1.3 Log copies/mL

## 2017-10-29 LAB — RPR TITER

## 2017-10-29 LAB — RPR: RPR: REACTIVE — AB

## 2017-10-31 DIAGNOSIS — R0902 Hypoxemia: Secondary | ICD-10-CM | POA: Diagnosis not present

## 2017-10-31 DIAGNOSIS — J9690 Respiratory failure, unspecified, unspecified whether with hypoxia or hypercapnia: Secondary | ICD-10-CM | POA: Diagnosis not present

## 2017-10-31 DIAGNOSIS — G4733 Obstructive sleep apnea (adult) (pediatric): Secondary | ICD-10-CM | POA: Diagnosis not present

## 2017-11-10 ENCOUNTER — Other Ambulatory Visit: Payer: Self-pay | Admitting: Family Medicine

## 2017-11-11 NOTE — Telephone Encounter (Signed)
Last seen 09/03/17  DWM

## 2017-11-19 ENCOUNTER — Other Ambulatory Visit: Payer: Self-pay | Admitting: Physician Assistant

## 2017-11-19 DIAGNOSIS — J301 Allergic rhinitis due to pollen: Secondary | ICD-10-CM

## 2017-11-20 ENCOUNTER — Other Ambulatory Visit: Payer: Self-pay | Admitting: Physician Assistant

## 2017-11-20 DIAGNOSIS — J301 Allergic rhinitis due to pollen: Secondary | ICD-10-CM

## 2017-11-21 ENCOUNTER — Other Ambulatory Visit: Payer: Self-pay | Admitting: Family Medicine

## 2017-11-21 DIAGNOSIS — Z794 Long term (current) use of insulin: Secondary | ICD-10-CM | POA: Diagnosis not present

## 2017-11-21 DIAGNOSIS — Z9641 Presence of insulin pump (external) (internal): Secondary | ICD-10-CM | POA: Diagnosis not present

## 2017-11-21 DIAGNOSIS — N183 Chronic kidney disease, stage 3 (moderate): Secondary | ICD-10-CM | POA: Diagnosis not present

## 2017-11-21 DIAGNOSIS — I129 Hypertensive chronic kidney disease with stage 1 through stage 4 chronic kidney disease, or unspecified chronic kidney disease: Secondary | ICD-10-CM | POA: Diagnosis not present

## 2017-11-21 DIAGNOSIS — E1022 Type 1 diabetes mellitus with diabetic chronic kidney disease: Secondary | ICD-10-CM | POA: Diagnosis not present

## 2017-11-24 NOTE — Telephone Encounter (Signed)
Ov 01/19/18

## 2017-12-02 ENCOUNTER — Other Ambulatory Visit: Payer: Medicare Other

## 2017-12-02 DIAGNOSIS — E1159 Type 2 diabetes mellitus with other circulatory complications: Secondary | ICD-10-CM

## 2017-12-02 DIAGNOSIS — I10 Essential (primary) hypertension: Principal | ICD-10-CM

## 2017-12-03 LAB — BMP8+EGFR
BUN / CREAT RATIO: 11 (ref 9–20)
BUN: 23 mg/dL (ref 6–24)
CO2: 22 mmol/L (ref 20–29)
Calcium: 9.4 mg/dL (ref 8.7–10.2)
Chloride: 98 mmol/L (ref 96–106)
Creatinine, Ser: 2.04 mg/dL — ABNORMAL HIGH (ref 0.76–1.27)
GFR, EST AFRICAN AMERICAN: 43 mL/min/{1.73_m2} — AB (ref >59)
GFR, EST NON AFRICAN AMERICAN: 37 mL/min/{1.73_m2} — AB (ref >59)
Glucose: 274 mg/dL — ABNORMAL HIGH (ref 65–99)
Potassium: 3.8 mmol/L (ref 3.5–5.2)
Sodium: 137 mmol/L (ref 134–144)

## 2017-12-04 DIAGNOSIS — E1022 Type 1 diabetes mellitus with diabetic chronic kidney disease: Secondary | ICD-10-CM | POA: Diagnosis not present

## 2017-12-04 DIAGNOSIS — Z794 Long term (current) use of insulin: Secondary | ICD-10-CM | POA: Diagnosis not present

## 2017-12-04 DIAGNOSIS — Z713 Dietary counseling and surveillance: Secondary | ICD-10-CM | POA: Diagnosis not present

## 2017-12-04 DIAGNOSIS — I129 Hypertensive chronic kidney disease with stage 1 through stage 4 chronic kidney disease, or unspecified chronic kidney disease: Secondary | ICD-10-CM | POA: Diagnosis not present

## 2017-12-04 DIAGNOSIS — N183 Chronic kidney disease, stage 3 (moderate): Secondary | ICD-10-CM | POA: Diagnosis not present

## 2017-12-07 ENCOUNTER — Other Ambulatory Visit: Payer: Self-pay | Admitting: Family Medicine

## 2017-12-08 NOTE — Telephone Encounter (Signed)
Last seen 09/03/17  DWM

## 2017-12-16 ENCOUNTER — Other Ambulatory Visit: Payer: Self-pay | Admitting: Family Medicine

## 2017-12-25 ENCOUNTER — Other Ambulatory Visit: Payer: Self-pay | Admitting: Family Medicine

## 2017-12-26 ENCOUNTER — Other Ambulatory Visit: Payer: Self-pay | Admitting: Family Medicine

## 2018-01-06 ENCOUNTER — Other Ambulatory Visit: Payer: Self-pay | Admitting: Family Medicine

## 2018-01-19 ENCOUNTER — Encounter: Payer: Self-pay | Admitting: Family Medicine

## 2018-01-19 ENCOUNTER — Ambulatory Visit (INDEPENDENT_AMBULATORY_CARE_PROVIDER_SITE_OTHER): Payer: Medicare Other | Admitting: Family Medicine

## 2018-01-19 VITALS — BP 155/100 | HR 90 | Temp 97.0°F | Ht 70.0 in | Wt 211.0 lb

## 2018-01-19 DIAGNOSIS — J301 Allergic rhinitis due to pollen: Secondary | ICD-10-CM

## 2018-01-19 DIAGNOSIS — I10 Essential (primary) hypertension: Secondary | ICD-10-CM

## 2018-01-19 DIAGNOSIS — E1065 Type 1 diabetes mellitus with hyperglycemia: Secondary | ICD-10-CM | POA: Diagnosis not present

## 2018-01-19 DIAGNOSIS — E1022 Type 1 diabetes mellitus with diabetic chronic kidney disease: Secondary | ICD-10-CM | POA: Diagnosis not present

## 2018-01-19 DIAGNOSIS — E559 Vitamin D deficiency, unspecified: Secondary | ICD-10-CM | POA: Diagnosis not present

## 2018-01-19 DIAGNOSIS — D509 Iron deficiency anemia, unspecified: Secondary | ICD-10-CM

## 2018-01-19 DIAGNOSIS — E7800 Pure hypercholesterolemia, unspecified: Secondary | ICD-10-CM

## 2018-01-19 DIAGNOSIS — E1159 Type 2 diabetes mellitus with other circulatory complications: Secondary | ICD-10-CM | POA: Diagnosis not present

## 2018-01-19 DIAGNOSIS — E349 Endocrine disorder, unspecified: Secondary | ICD-10-CM

## 2018-01-19 DIAGNOSIS — N400 Enlarged prostate without lower urinary tract symptoms: Secondary | ICD-10-CM

## 2018-01-19 DIAGNOSIS — N183 Chronic kidney disease, stage 3 (moderate): Secondary | ICD-10-CM

## 2018-01-19 DIAGNOSIS — Z0000 Encounter for general adult medical examination without abnormal findings: Secondary | ICD-10-CM

## 2018-01-19 DIAGNOSIS — IMO0002 Reserved for concepts with insufficient information to code with codable children: Secondary | ICD-10-CM

## 2018-01-19 LAB — BAYER DCA HB A1C WAIVED: HB A1C (BAYER DCA - WAIVED): 7.2 % — ABNORMAL HIGH (ref <7.0)

## 2018-01-19 MED ORDER — ONDANSETRON HCL 4 MG PO TABS: 4.0000 mg | ORAL_TABLET | Freq: Three times a day (TID) | ORAL | 6 refills | Status: DC | PRN

## 2018-01-19 MED ORDER — LEVOTHYROXINE SODIUM 175 MCG PO TABS: 175.0000 ug | ORAL_TABLET | Freq: Every day | ORAL | 3 refills | Status: DC

## 2018-01-19 NOTE — Progress Notes (Signed)
Subjective:    Patient ID: Patrick Brown, male    DOB: 10/24/1970, 47 y.o.   MRN: [id number]  HPI Pt here for follow up and management of chronic medical problems which includes diabetes, hyperlipidemia and hypertension. He is taking medication regularly.  This patient has no specific complaints today.  He has a blood pressure that was elevated on 2 occasions during the visit today.  He is due to return in FOBT and get lab work today.  The patient today does complain of some hip issues.  He sees Percell Miller and [facility].  They are following up on this.  He denies any trouble with chest pain pressure tightness or shortness of breath.  He denies any trouble with swallowing heartburn indigestion nausea vomiting diarrhea blood in the stool black tarry bowel movements or change in bowel habits.  He is due to get another colonoscopy in about 1 year from Dr. Collene Mares.  He is aware of this.  He is passing his water without problems.  He sees the ophthalmologist regularly.  He sees infectious disease and nephrology regularly.  His blood pressure is elevated today and he has not been checking it at home.  He denies any dizziness or headaches.  The patient has an appointment with his nephrologist this afternoon.  He also sees an endocrinologist at [facility] and his A1c is down to mid 6 range which is great for him.      Patient Active Problem List   Diagnosis Date Noted  . Routine screening for STI (sexually transmitted infection) 10/27/2017  . Need for prophylactic vaccination against Streptococcus pneumoniae (pneumococcus) 10/27/2017  . Hyperkalemia 03/08/2017  . Non-seasonal allergic rhinitis due to pollen 11/06/2016  . S/P ORIF (open reduction internal fixation) fracture   . Closed right hip fracture, initial encounter ([place]) 05/09/2016  . Benign prostatic hyperplasia without lower urinary tract symptoms 04/18/2016  . Vitamin D deficiency 04/18/2016  . OSA (obstructive sleep  apnea) 10/06/2014  . Medicare annual wellness visit, initial 12/23/2013  . Pseudophakia of both eyes 06/23/2013  . Pain in joint, lower leg 11/19/2012  . Unspecified sinusitis (chronic) 09/16/2012  . Right groin wound 08/09/2012  . HIV disease ([place]) 06/02/2012  . Convulsions/seizures ([place]) 05/16/2012  . Pulmonary infiltrate 04/18/2012  . Gastroparesis 04/18/2012  . Anemia, iron deficiency   . Type 1 diabetes mellitus ([place]) 05/08/2011  . Acquired hypothyroidism 05/08/2011  . Chronic kidney disease, stage 3, mod decreased GFR ([facility]) 12/31/2010   Outpatient Encounter Medications as of 01/19/2018  Medication Sig  . abacavir-dolutegravir-lamiVUDine (TRIUMEQ) 600-50-300 MG tablet Take 1 tablet by mouth daily.  Marland Kitchen acetaminophen (TYLENOL) 500 MG tablet Take 1,000 mg by mouth 2 (two) times daily.   Marland Kitchen acyclovir (ZOVIRAX) 400 MG tablet TAKE 1 TABLET (400 MG TOTAL) BY MOUTH 2 (TWO) TIMES DAILY.  Marland Kitchen AGAMATRIX ULTRA-THIN LANCETS MISC TEST BS 4 TIMES A DAY AND AS NEEDED E10.65  . amLODipine (NORVASC) 5 MG tablet Take 5 mg by mouth daily.  Marland Kitchen aspirin 81 MG chewable tablet Chew 81 mg by mouth every morning.  . cetirizine (ZYRTEC) 10 MG tablet TAKE 1 TABLET BY MOUTH EVERY DAY  . cyclobenzaprine (FLEXERIL) 10 MG tablet Take 1 tablet (10 mg total) by mouth 3 (three) times daily as needed for muscle spasms.  . DULoxetine (CYMBALTA) 60 MG capsule TAKE 1 CAPSULE (60 MG TOTAL) BY MOUTH 2 (TWO) TIMES DAILY. AS DIRECTED  . esomeprazole (NEXIUM) 40 MG capsule TAKE 1 CAPSULE BY  MOUTH EVERY DAY  . febuxostat (ULORIC) 40 MG tablet TAKE 1 TABLET BY MOUTH EVERY DAY  . ferrous sulfate 325 (65 FE) MG tablet Take 650 mg by mouth daily with breakfast.   . fludrocortisone (FLORINEF) 0.1 MG tablet Take 0.1 mg by mouth daily.  . fluticasone (FLONASE) 50 MCG/ACT nasal spray SPRAY 2 SPRAYS INTO EACH NOSTRIL EVERY DAY  . furosemide (LASIX) 40 MG tablet Take 20 mg by mouth 2 (two) times daily.  . Glucosamine-Chondroit-Vit C-Mn  (GLUCOSAMINE 1500 COMPLEX PO) Take 1 tablet by mouth 2 (two) times daily.   Marland Kitchen glucose blood (ONETOUCH VERIO) test strip TEST BLOOD SUGAR 4 TIMES DAILY AND AS NEEDED  . HYDROcodone-homatropine (HYCODAN) 5-1.5 MG/5ML syrup Take 5 mLs by mouth every 6 (six) hours as needed for cough.  . insulin lispro (HUMALOG) 100 UNIT/ML injection USE 42 UNITS TO 120 UNITS PER PUMP DAILY AS DIRECTED  . ipratropium (ATROVENT) 0.03 % nasal spray USE 2 SPRAY IN EACH NOSTRIL 2-3 TIMES DAILY  . levothyroxine (SYNTHROID, LEVOTHROID) 175 MCG tablet TAKE 1 TABLET (175 MCG TOTAL) BY MOUTH DAILY BEFORE BREAKFAST.  Marland Kitchen LORazepam (ATIVAN) 0.5 MG tablet TAKE 1 TABLET (0.5 MG TOTAL) BY MOUTH DAILY AS NEEDED.  Marland Kitchen metoCLOPramide (REGLAN) 5 MG tablet TAKE 1 TABLET BY MOUTH 3 TIMES A DAY BEFORE MEALS  . niacin (NIASPAN) 1000 MG CR tablet TAKE 1 TABLET (1,000 MG TOTAL) BY MOUTH AT BEDTIME. (Patient taking differently: TAKE 1 TABLET (1,500 MG TOTAL) BY MOUTH AT BEDTIME.)  . omega-3 acid ethyl esters (LOVAZA) 1 g capsule TAKE 2 CAPSULES (2 G TOTAL) BY MOUTH 2 (TWO) TIMES DAILY.  Marland Kitchen ondansetron (ZOFRAN) 4 MG tablet TAKE 1 TABLET (4 MG TOTAL) BY MOUTH EVERY 8 (EIGHT) HOURS AS NEEDED FOR NAUSEA.  Marland Kitchen OVER THE COUNTER MEDICATION Take 1 capsule by mouth daily. [name]- probiotic daily  . simvastatin (ZOCOR) 40 MG tablet TAKE 1 TABLET BY MOUTH EVERYDAY AT BEDTIME  . Testosterone 20.25 MG/ACT (1.62%) GEL APPLY 3 PUMPS DAILY AS DIRECTED  . traMADol (ULTRAM) 50 MG tablet Take 1 tablet (50 mg total) by mouth every 12 (twelve) hours as needed. for pain  . [DISCONTINUED] Testosterone 20.25 MG/ACT (1.62%) GEL APPLY 2 PUMPS DAILY AS DIRECTED   No facility-administered encounter medications on file as of 01/19/2018.      Review of Systems  Constitutional: Negative.   HENT: Negative.   Eyes: Negative.   Respiratory: Negative.   Cardiovascular: Negative.   Gastrointestinal: Negative.   Endocrine: Negative.   Genitourinary: Negative.     Musculoskeletal: Negative.   Skin: Negative.   Allergic/Immunologic: Negative.   Neurological: Negative.   Hematological: Negative.   Psychiatric/Behavioral: Negative.        Objective:   Physical Exam Vitals signs and nursing note reviewed.  Constitutional:      Appearance: Normal appearance. He is well-developed. He is obese. He is not ill-appearing.     Comments: The patient is pleasant and in good spirits.  He is frustrated with his ongoing hip pain but continues to see the orthopedist and is teaching currently  HENT:     Head: Normocephalic and atraumatic.     Right Ear: Tympanic membrane, ear canal and external ear normal. There is no impacted cerumen.     Left Ear: Tympanic membrane, ear canal and external ear normal. There is no impacted cerumen.     Nose: Nose normal.     Mouth/Throat:     Mouth: Mucous membranes are moist.  Pharynx: Oropharynx is clear. No oropharyngeal exudate.  Eyes:     General: No scleral icterus.       Right eye: No discharge.        Left eye: No discharge.     Conjunctiva/sclera: Conjunctivae normal.     Pupils: Pupils are equal, round, and reactive to light.  Neck:     Musculoskeletal: Normal range of motion and neck supple. No muscular tenderness.     Thyroid: No thyromegaly.     Vascular: No carotid bruit.     Trachea: No tracheal deviation.     Comments: No bruits thyromegaly or anterior cervical adenopathy Cardiovascular:     Rate and Rhythm: Normal rate and regular rhythm.     Pulses: Normal pulses.     Heart sounds: Normal heart sounds. No murmur.     Comments: Heart is regular at 84/min Pulmonary:     Effort: Pulmonary effort is normal.     Breath sounds: Normal breath sounds. No wheezing or rales.     Comments: No chest wall masses or chest wall tenderness and no axillary adenopathy.  Lungs are clear anteriorly and posteriorly. Chest:     Chest wall: No tenderness.  Abdominal:     General: Abdomen is flat. Bowel sounds are  normal. There is no distension.     Palpations: Abdomen is soft. There is no mass.     Tenderness: There is no abdominal tenderness.     Comments: No masses tenderness organ enlargement or bruits  Musculoskeletal: Normal range of motion.        General: No tenderness.  Lymphadenopathy:     Cervical: No cervical adenopathy.  Skin:    General: Skin is warm and dry.     Findings: No rash.  Neurological:     General: No focal deficit present.     Mental Status: He is alert and oriented to person, place, and time.     Cranial Nerves: No cranial nerve deficit.     Sensory: No sensory deficit.     Motor: No weakness.     Deep Tendon Reflexes: Reflexes are normal and symmetric.     Comments: Flexes in the lower extremity are 2+ and equal bilaterally.  Psychiatric:        Behavior: Behavior normal.        Thought Content: Thought content normal.        Judgment: Judgment normal.     BP (!) 154/99 (BP Location: Left Arm)   Pulse 90   Temp (!) 97 F (36.1 C) (Oral)   Ht '5\' 10"'$  (1.778 m)   Wt 211 lb (95.7 kg)   BMI 30.28 kg/m        Assessment & Plan:  1. Uncontrolled type 1 diabetes mellitus with stage 3 chronic kidney disease ([place]) -Diabetes is much better control with an A1c in the mid 6 range currently. - BMP8+EGFR - CBC with Differential/Platelet - Bayer DCA Hb A1c Waived - Microalbumin / creatinine urine ratio  2. Pure hypercholesterolemia -Continue with current treatment pending results of lab work - CBC with Differential/Platelet - Lipid panel  3. Vitamin D deficiency -Continue with vitamin D replacement as determined by nephrologist - CBC with Differential/Platelet - VITAMIN D 25 Hydroxy (Vit-D Deficiency, Fractures)  4. Testosterone deficiency -Continue with current treatment - CBC with Differential/Platelet  5. Benign prostatic hyperplasia without lower urinary tract symptoms -No complaints today with voiding - CBC with Differential/Platelet  6. Iron  deficiency anemia, unspecified  iron deficiency anemia type - CBC with Differential/Platelet  7. Hypertension associated with diabetes ([place]) -A third blood pressure repeated was 140/96 with a large cuff in the left arm sitting.  We will let the nephrologist make any further adjustments as he will be seeing him later today. - BMP8+EGFR - CBC with Differential/Platelet - Hepatic function panel  8. Healthcare maintenance - Thyroid Panel With TSH  9. Non-seasonal allergic rhinitis due to pollen- -continue with current treatment and avoid the use of overhead fans and keep the house as cool as possible  Meds ordered this encounter  Medications  . ondansetron (ZOFRAN) 4 MG tablet    Sig: Take 1 tablet (4 mg total) by mouth every 8 (eight) hours as needed for nausea.    Dispense:  45 tablet    Refill:  6  . levothyroxine (SYNTHROID, LEVOTHROID) 175 MCG tablet    Sig: Take 1 tablet (175 mcg total) by mouth daily before breakfast.    Dispense:  90 tablet    Refill:  3   Patient Instructions                       Medicare Annual Wellness Visit   and the medical providers at [facility] strive to bring you the best medical care.  In doing so we not only want to address your current medical conditions and concerns but also to detect new conditions early and prevent illness, disease and health-related problems.    Medicare offers a yearly Wellness Visit which allows our clinical staff to assess your need for preventative services including immunizations, lifestyle education, counseling to decrease risk of preventable diseases and screening for fall risk and other medical concerns.    This visit is provided free of charge (no copay) for all Medicare recipients. The clinical pharmacists at [facility] have begun to conduct these Wellness Visits which will also include a thorough review of all your medications.    As you primary medical  provider recommend that you make an appointment for your Annual Wellness Visit if you have not done so already this year.  You may set up this appointment before you leave today or you may call back ([phone]) and schedule an appointment.  Please make sure when you call that you mention that you are scheduling your Annual Wellness Visit with the clinical pharmacist so that the appointment may be made for the proper length of time.     Continue current medications. Continue good therapeutic lifestyle changes which include good diet and exercise. Fall precautions discussed with patient. If an FOBT was given today- please return it to our front desk. If you are over 67 years old - you may need Prevnar 16 or the adult Pneumonia vaccine.  **Flu shots are available--- please call and schedule a FLU-CLINIC appointment**  After your visit with [place] today you will receive a survey in the mail or online from [employer] regarding your care with [place]. Please take a moment to fill this out. Your feedback is very important to [place] as you can help [place] better understand your patient needs as well as improve your experience and satisfaction. WE CARE ABOUT YOU!!!   Stay as active physically as possible Continue with aggressive therapeutic lifestyle changes including diet and exercise to achieve weight loss Follow-up with specialist as planned, including orthopedist infectious disease and endocrinology nephrology and cardiology. Do not forget to get your colonoscopy in about a  year. Also continue to follow-up with ophthalmology.  Arrie Senate MD

## 2018-01-19 NOTE — Patient Instructions (Addendum)
Medicare Annual Wellness Visit  [facility] and the medical providers at [facility] strive to bring you the best medical care.  In doing so we not only want to address your current medical conditions and concerns but also to detect new conditions early and prevent illness, disease and health-related problems.    Medicare offers a yearly Wellness Visit which allows our clinical staff to assess your need for preventative services including immunizations, lifestyle education, counseling to decrease risk of preventable diseases and screening for fall risk and other medical concerns.    This visit is provided free of charge (no copay) for all Medicare recipients. The clinical pharmacists at [facility] have begun to conduct these Wellness Visits which will also include a thorough review of all your medications.    As you primary medical provider recommend that you make an appointment for your Annual Wellness Visit if you have not done so already this year.  You may set up this appointment before you leave today or you may call back ([phone]) and schedule an appointment.  Please make sure when you call that you mention that you are scheduling your Annual Wellness Visit with the clinical pharmacist so that the appointment may be made for the proper length of time.     Continue current medications. Continue good therapeutic lifestyle changes which include good diet and exercise. Fall precautions discussed with patient. If an FOBT was given today- please return it to our front desk. If you are over 50 years old - you may need Prevnar 53 or the adult Pneumonia vaccine.  **Flu shots are available--- please call and schedule a FLU-CLINIC appointment**  After your visit with [place] today you will receive a survey in the mail or online from [employer] regarding your care with [place]. Please take a moment to fill this out. Your feedback is very  important to [place] as you can help [place] better understand your patient needs as well as improve your experience and satisfaction. WE CARE ABOUT YOU!!!   Stay as active physically as possible Continue with aggressive therapeutic lifestyle changes including diet and exercise to achieve weight loss Follow-up with specialist as planned, including orthopedist infectious disease and endocrinology nephrology and cardiology. Do not forget to get your colonoscopy in about a year. Also continue to follow-up with ophthalmology.

## 2018-01-20 LAB — HEPATIC FUNCTION PANEL
ALT: 29 IU/L (ref 0–44)
AST: 27 IU/L (ref 0–40)
Albumin: 4.3 g/dL (ref 3.5–5.5)
Alkaline Phosphatase: 103 IU/L (ref 39–117)
BILIRUBIN, DIRECT: 0.09 mg/dL (ref 0.00–0.40)
Bilirubin Total: 0.3 mg/dL (ref 0.0–1.2)
Total Protein: 6.8 g/dL (ref 6.0–8.5)

## 2018-01-20 LAB — CBC WITH DIFFERENTIAL/PLATELET
Basophils Absolute: 0.1 10*3/uL (ref 0.0–0.2)
Basos: 1 %
EOS (ABSOLUTE): 0.4 10*3/uL (ref 0.0–0.4)
EOS: 6 %
HEMATOCRIT: 36.8 % — AB (ref 37.5–51.0)
Hemoglobin: 12.9 g/dL — ABNORMAL LOW (ref 13.0–17.7)
IMMATURE GRANS (ABS): 0.1 10*3/uL (ref 0.0–0.1)
Immature Granulocytes: 1 %
LYMPHS: 34 %
Lymphocytes Absolute: 2.5 10*3/uL (ref 0.7–3.1)
MCH: 33.5 pg — ABNORMAL HIGH (ref 26.6–33.0)
MCHC: 35.1 g/dL (ref 31.5–35.7)
MCV: 96 fL (ref 79–97)
Monocytes Absolute: 0.8 10*3/uL (ref 0.1–0.9)
Monocytes: 10 %
NEUTROS PCT: 48 %
Neutrophils Absolute: 3.7 10*3/uL (ref 1.4–7.0)
Platelets: 262 10*3/uL (ref 150–450)
RBC: 3.85 x10E6/uL — ABNORMAL LOW (ref 4.14–5.80)
RDW: 13.3 % (ref 12.3–15.4)
WBC: 7.6 10*3/uL (ref 3.4–10.8)

## 2018-01-20 LAB — BMP8+EGFR
BUN/Creatinine Ratio: 14 (ref 9–20)
BUN: 36 mg/dL — ABNORMAL HIGH (ref 6–24)
CALCIUM: 9.3 mg/dL (ref 8.7–10.2)
CO2: 20 mmol/L (ref 20–29)
CREATININE: 2.53 mg/dL — AB (ref 0.76–1.27)
Chloride: 101 mmol/L (ref 96–106)
GFR calc Af Amer: 33 mL/min/{1.73_m2} — ABNORMAL LOW (ref >59)
GFR calc non Af Amer: 29 mL/min/{1.73_m2} — ABNORMAL LOW (ref >59)
Glucose: 146 mg/dL — ABNORMAL HIGH (ref 65–99)
Potassium: 4.5 mmol/L (ref 3.5–5.2)
Sodium: 141 mmol/L (ref 134–144)

## 2018-01-20 LAB — VITAMIN D 25 HYDROXY (VIT D DEFICIENCY, FRACTURES): VIT D 25 HYDROXY: 28.1 ng/mL — AB (ref 30.0–100.0)

## 2018-01-20 LAB — LIPID PANEL
CHOL/HDL RATIO: 3.9 ratio (ref 0.0–5.0)
Cholesterol, Total: 159 mg/dL (ref 100–199)
HDL: 41 mg/dL (ref >39)
LDL Calculated: 81 mg/dL (ref 0–99)
Triglycerides: 187 mg/dL — ABNORMAL HIGH (ref 0–149)
VLDL CHOLESTEROL CAL: 37 mg/dL (ref 5–40)

## 2018-01-20 LAB — MICROALBUMIN / CREATININE URINE RATIO
Creatinine, Urine: 108.2 mg/dL
MICROALB/CREAT RATIO: 2096.4 mg/g{creat} — AB (ref 0.0–30.0)
MICROALBUM., U, RANDOM: 2268.3 ug/mL

## 2018-01-20 LAB — THYROID PANEL WITH TSH
Free Thyroxine Index: 2 (ref 1.2–4.9)
T3 Uptake Ratio: 23 % — ABNORMAL LOW (ref 24–39)
T4, Total: 8.6 ug/dL (ref 4.5–12.0)
TSH: 3.84 u[IU]/mL (ref 0.450–4.500)

## 2018-02-02 ENCOUNTER — Other Ambulatory Visit: Payer: Self-pay | Admitting: Family Medicine

## 2018-02-02 DIAGNOSIS — E1039 Type 1 diabetes mellitus with other diabetic ophthalmic complication: Secondary | ICD-10-CM | POA: Diagnosis not present

## 2018-02-02 DIAGNOSIS — E103552 Type 1 diabetes mellitus with stable proliferative diabetic retinopathy, left eye: Secondary | ICD-10-CM | POA: Diagnosis not present

## 2018-02-02 DIAGNOSIS — E103551 Type 1 diabetes mellitus with stable proliferative diabetic retinopathy, right eye: Secondary | ICD-10-CM | POA: Diagnosis not present

## 2018-02-03 ENCOUNTER — Other Ambulatory Visit: Payer: Self-pay | Admitting: Family Medicine

## 2018-02-03 DIAGNOSIS — R0902 Hypoxemia: Secondary | ICD-10-CM | POA: Diagnosis not present

## 2018-02-03 DIAGNOSIS — G4733 Obstructive sleep apnea (adult) (pediatric): Secondary | ICD-10-CM | POA: Diagnosis not present

## 2018-02-03 DIAGNOSIS — J9690 Respiratory failure, unspecified, unspecified whether with hypoxia or hypercapnia: Secondary | ICD-10-CM | POA: Diagnosis not present

## 2018-02-10 ENCOUNTER — Encounter: Payer: Self-pay | Admitting: *Deleted

## 2018-02-12 ENCOUNTER — Other Ambulatory Visit: Payer: Self-pay | Admitting: Family Medicine

## 2018-02-12 DIAGNOSIS — J301 Allergic rhinitis due to pollen: Secondary | ICD-10-CM

## 2018-02-18 ENCOUNTER — Ambulatory Visit: Payer: Medicare Other

## 2018-02-23 ENCOUNTER — Other Ambulatory Visit: Payer: Self-pay | Admitting: Family Medicine

## 2018-02-25 ENCOUNTER — Ambulatory Visit: Payer: Medicare Other

## 2018-02-28 ENCOUNTER — Other Ambulatory Visit: Payer: Self-pay | Admitting: Family Medicine

## 2018-03-03 ENCOUNTER — Encounter: Payer: Self-pay | Admitting: Internal Medicine

## 2018-03-11 DIAGNOSIS — E109 Type 1 diabetes mellitus without complications: Secondary | ICD-10-CM | POA: Diagnosis not present

## 2018-03-13 ENCOUNTER — Other Ambulatory Visit: Payer: Self-pay | Admitting: Family Medicine

## 2018-03-15 ENCOUNTER — Encounter: Payer: Self-pay | Admitting: Family Medicine

## 2018-03-16 ENCOUNTER — Other Ambulatory Visit: Payer: Self-pay | Admitting: Family Medicine

## 2018-03-16 MED ORDER — ICOSAPENT ETHYL 1 G PO CAPS: 2.0000 | ORAL_CAPSULE | Freq: Two times a day (BID) | ORAL | 3 refills | Status: DC

## 2018-04-06 ENCOUNTER — Other Ambulatory Visit: Payer: Self-pay | Admitting: Family Medicine

## 2018-04-16 ENCOUNTER — Other Ambulatory Visit: Payer: Self-pay | Admitting: Family Medicine

## 2018-04-21 DIAGNOSIS — Z9889 Other specified postprocedural states: Secondary | ICD-10-CM | POA: Diagnosis not present

## 2018-04-21 DIAGNOSIS — Z961 Presence of intraocular lens: Secondary | ICD-10-CM | POA: Diagnosis not present

## 2018-04-21 DIAGNOSIS — Z9842 Cataract extraction status, left eye: Secondary | ICD-10-CM | POA: Diagnosis not present

## 2018-05-06 DIAGNOSIS — G4733 Obstructive sleep apnea (adult) (pediatric): Secondary | ICD-10-CM | POA: Diagnosis not present

## 2018-05-19 DIAGNOSIS — I129 Hypertensive chronic kidney disease with stage 1 through stage 4 chronic kidney disease, or unspecified chronic kidney disease: Secondary | ICD-10-CM | POA: Diagnosis not present

## 2018-05-19 DIAGNOSIS — N183 Chronic kidney disease, stage 3 (moderate): Secondary | ICD-10-CM | POA: Diagnosis not present

## 2018-05-19 DIAGNOSIS — E1022 Type 1 diabetes mellitus with diabetic chronic kidney disease: Secondary | ICD-10-CM | POA: Diagnosis not present

## 2018-05-22 DIAGNOSIS — Z9641 Presence of insulin pump (external) (internal): Secondary | ICD-10-CM | POA: Diagnosis not present

## 2018-05-22 DIAGNOSIS — N183 Chronic kidney disease, stage 3 (moderate): Secondary | ICD-10-CM | POA: Diagnosis not present

## 2018-05-22 DIAGNOSIS — E1022 Type 1 diabetes mellitus with diabetic chronic kidney disease: Secondary | ICD-10-CM | POA: Diagnosis not present

## 2018-05-22 DIAGNOSIS — I129 Hypertensive chronic kidney disease with stage 1 through stage 4 chronic kidney disease, or unspecified chronic kidney disease: Secondary | ICD-10-CM | POA: Diagnosis not present

## 2018-05-25 ENCOUNTER — Ambulatory Visit: Payer: Medicare Other | Admitting: Family Medicine

## 2018-05-25 ENCOUNTER — Other Ambulatory Visit: Payer: Self-pay | Admitting: Family Medicine

## 2018-05-27 ENCOUNTER — Other Ambulatory Visit: Payer: Self-pay

## 2018-05-27 ENCOUNTER — Ambulatory Visit (INDEPENDENT_AMBULATORY_CARE_PROVIDER_SITE_OTHER): Payer: Medicare Other | Admitting: Family Medicine

## 2018-05-27 ENCOUNTER — Encounter: Payer: Self-pay | Admitting: Family Medicine

## 2018-05-27 DIAGNOSIS — N183 Chronic kidney disease, stage 3 unspecified: Secondary | ICD-10-CM

## 2018-05-27 DIAGNOSIS — E1159 Type 2 diabetes mellitus with other circulatory complications: Secondary | ICD-10-CM

## 2018-05-27 DIAGNOSIS — E1022 Type 1 diabetes mellitus with diabetic chronic kidney disease: Secondary | ICD-10-CM

## 2018-05-27 DIAGNOSIS — E559 Vitamin D deficiency, unspecified: Secondary | ICD-10-CM | POA: Diagnosis not present

## 2018-05-27 DIAGNOSIS — E039 Hypothyroidism, unspecified: Secondary | ICD-10-CM

## 2018-05-27 DIAGNOSIS — I152 Hypertension secondary to endocrine disorders: Secondary | ICD-10-CM

## 2018-05-27 DIAGNOSIS — G4733 Obstructive sleep apnea (adult) (pediatric): Secondary | ICD-10-CM

## 2018-05-27 DIAGNOSIS — B20 Human immunodeficiency virus [HIV] disease: Secondary | ICD-10-CM

## 2018-05-27 DIAGNOSIS — E349 Endocrine disorder, unspecified: Secondary | ICD-10-CM | POA: Diagnosis not present

## 2018-05-27 DIAGNOSIS — IMO0002 Reserved for concepts with insufficient information to code with codable children: Secondary | ICD-10-CM

## 2018-05-27 DIAGNOSIS — Z8781 Personal history of (healed) traumatic fracture: Secondary | ICD-10-CM

## 2018-05-27 DIAGNOSIS — E7800 Pure hypercholesterolemia, unspecified: Secondary | ICD-10-CM

## 2018-05-27 DIAGNOSIS — E1065 Type 1 diabetes mellitus with hyperglycemia: Secondary | ICD-10-CM

## 2018-05-27 DIAGNOSIS — J301 Allergic rhinitis due to pollen: Secondary | ICD-10-CM

## 2018-05-27 DIAGNOSIS — I10 Essential (primary) hypertension: Secondary | ICD-10-CM

## 2018-05-27 MED ORDER — MECLIZINE HCL 12.5 MG PO TABS: 12.5000 mg | ORAL_TABLET | Freq: Three times a day (TID) | ORAL | 3 refills | Status: AC | PRN

## 2018-05-27 NOTE — Addendum Note (Signed)
Addended by: Zannie Cove on: 05/27/2018 10:32 AM   Modules accepted: Orders

## 2018-05-27 NOTE — Patient Instructions (Signed)
Continue to check blood pressures periodically watch sodium intake and diet closely Follow-up with nephrology endocrinology and infectious disease as planned Do not forget to get your colonoscopy with Dr. Collene Mares after you turn 48 Continue to practice good hand and respiratory hygiene because of COVID-19 Drink plenty of fluids and stay well-hydrated

## 2018-05-27 NOTE — Progress Notes (Signed)
Virtual Visit Via telephone Note I connected with@ on 05/27/18 by telephone and verified that I am speaking with the correct person or authorized healthcare agent using two identifiers. Patrick Brown is currently located at home and there are no unauthorized people in close proximity. I completed this visit while in a private location in my home .  I connected to the patient by telephone and verified by that I was speaking with the right person.  This visit type was conducted due to national recommendations for restrictions regarding the COVID-19 Pandemic (e.g. social distancing).  This format is felt to be most appropriate for this patient at this time.  All issues noted in this document were discussed and addressed.  No physical exam was performed.    I discussed the limitations, risks, security and privacy concerns of performing an evaluation and management service by telephone and the availability of in person appointments. I also discussed with the patient that there may be a patient responsible charge related to this service. The patient expressed understanding and agreed to proceed.   Date:  05/27/2018    ID:  Patrick Brown      Aug 15, 2000        [id number]   Patient Care Team Patient Care Team: Chipper Herb, MD as PCP - General (Family Medicine) Comer, Okey Regal, MD as PCP - Infectious Diseases (Infectious Diseases) Rankin, Clent Demark, MD (Ophthalmology) Chesley Mires, MD as Consulting Physician (Pulmonary Disease) Harriett Sine, MD as Consulting Physician (Dermatology) Regenia Skeeter, MD as Referring Physician (Internal Medicine)  Reason for Visit: Primary Care Follow-up     History of Present Illness & Review of Systems:     Patrick Brown is a 48 y.o. year old male primary care patient that presents today for a telehealth visit.  The patient is doing well overall.  He is followed regularly by infectious disease endocrinology and nephrology.  He is protecting himself  by avoiding going out in public and wearing personal protective equipment.  He denies any chest pain pressure tightness or shortness of breath.  He denies any trouble with swallowing heartburn indigestion nausea vomiting diarrhea blood in the stool black tarry bowel movements or change in bowel habits.  He does take his Nexium regularly.  He will be due a colonoscopy this fall when he turns 58 by Dr. Juanita Craver.  He is passing his water well.  The nephrologist recently took him off of amlodipine and put him on Cozaar 25 mg 1 daily.  This was done just recently.  He is not have any blood pressures to report since this change was recently made.  He has been having some episodes of dizziness especially with the spring allergy season and would like a prescription for Antivert and he is currently taking the yellow and white man.  We will call in a prescription for this.  He is also taking a large dose of thyroid medicine and this is 175 mcg 1 daily Monday through Friday and a half a 1 on Saturday and Sunday.  We will need to make sure that we check a thyroid profile with his blood draw.  He did have a hemoglobin A1c by the endocrinologist and that was very recent and it was 6.9% so we will not need to do that.  Review of systems as stated otherwise negative for body systems left unmentioned.   The patient does not have symptoms concerning for COVID-19 infection (fever, chills, cough, or new  shortness of breath).      Current Medications (Verified) Allergies as of 05/27/2018      Reactions   Sulfa Antibiotics Other (See Comments)   High potassium   Ramipril Cough   Versed [midazolam] Other (See Comments)   "I don't wake up very good or clear it out of my system"      Medication List       Accurate as of May 27, 2018  7:46 AM. Always use your most recent med list.        abacavir-dolutegravir-lamiVUDine 600-50-300 MG tablet Commonly known as:  Triumeq Take 1 tablet by mouth daily.    acetaminophen 500 MG tablet Commonly known as:  TYLENOL Take 1,000 mg by mouth 2 (two) times daily.   acyclovir 400 MG tablet Commonly known as:  ZOVIRAX TAKE 1 TABLET (400 MG TOTAL) BY MOUTH 2 (TWO) TIMES DAILY.   AgaMatrix Ultra-Thin Lancets Misc TEST BS 4 TIMES A DAY AND AS NEEDED E10.65   amLODipine 5 MG tablet Commonly known as:  NORVASC Take 5 mg by mouth daily.   aspirin 81 MG chewable tablet Chew 81 mg by mouth every morning.   cetirizine 10 MG tablet Commonly known as:  ZYRTEC TAKE 1 TABLET BY MOUTH EVERY DAY   cyclobenzaprine 10 MG tablet Commonly known as:  FLEXERIL Take 1 tablet (10 mg total) by mouth 3 (three) times daily as needed for muscle spasms.   diclofenac sodium 1 % Gel Commonly known as:  VOLTAREN Apply 2 g topically 4 (four) times daily.   DULoxetine 60 MG capsule Commonly known as:  CYMBALTA TAKE 1 CAPSULE (60 MG TOTAL) BY MOUTH 2 (TWO) TIMES DAILY. AS DIRECTED   esomeprazole 40 MG capsule Commonly known as:  NEXIUM TAKE 1 CAPSULE BY MOUTH EVERY DAY   febuxostat 40 MG tablet Commonly known as:  ULORIC TAKE 1 TABLET BY MOUTH EVERY DAY   ferrous sulfate 325 (65 FE) MG tablet Take 650 mg by mouth daily with breakfast.   fludrocortisone 0.1 MG tablet Commonly known as:  FLORINEF Take 0.1 mg by mouth daily.   fluticasone 50 MCG/ACT nasal spray Commonly known as:  FLONASE SPRAY 2 SPRAYS INTO EACH NOSTRIL EVERY DAY   furosemide 40 MG tablet Commonly known as:  LASIX Take 20 mg by mouth 2 (two) times daily.   GLUCOSAMINE 1500 COMPLEX PO Take 1 tablet by mouth 2 (two) times daily.   glucose blood test strip Commonly known as:  OneTouch Verio TEST BLOOD SUGAR 4 TIMES DAILY AND AS NEEDED   HYDROcodone-homatropine 5-1.5 MG/5ML syrup Commonly known as:  HYCODAN Take 5 mLs by mouth every 6 (six) hours as needed for cough.   Icosapent Ethyl 1 g Caps Commonly known as:  Vascepa Take 2 capsules (2 g total) by mouth 2 (two) times daily.    insulin lispro 100 UNIT/ML injection Commonly known as:  HumaLOG USE 42 UNITS TO 120 UNITS PER PUMP DAILY AS DIRECTED   ipratropium 0.03 % nasal spray Commonly known as:  ATROVENT USE 2 SPRAY IN EACH NOSTRIL 2-3 TIMES DAILY   levothyroxine 175 MCG tablet Commonly known as:  SYNTHROID Take 1 tablet (175 mcg total) by mouth daily before breakfast.   LORazepam 0.5 MG tablet Commonly known as:  ATIVAN TAKE 1 TABLET (0.5 MG TOTAL) BY MOUTH DAILY AS NEEDED.   metoCLOPramide 5 MG tablet Commonly known as:  REGLAN TAKE 1 TABLET BY MOUTH 3 TIMES A DAY BEFORE MEALS   niacin 1000  MG CR tablet Commonly known as:  NIASPAN TAKE 1 TABLET (1,000 MG TOTAL) BY MOUTH AT BEDTIME.   omega-3 acid ethyl esters 1 g capsule Commonly known as:  LOVAZA TAKE 2 CAPSULES (2 G TOTAL) BY MOUTH 2 (TWO) TIMES DAILY.   ondansetron 4 MG tablet Commonly known as:  ZOFRAN Take 1 tablet (4 mg total) by mouth every 8 (eight) hours as needed for nausea.   OVER THE COUNTER MEDICATION Take 1 capsule by mouth daily. [name]- probiotic daily   simvastatin 40 MG tablet Commonly known as:  ZOCOR TAKE 1 TABLET BY MOUTH EVERYDAY AT BEDTIME   Testosterone 20.25 MG/ACT (1.62%) Gel APPLY 3 PUMPS DAILY AS DIRECTED   traMADol 50 MG tablet Commonly known as:  ULTRAM Take 1 tablet (50 mg total) by mouth every 12 (twelve) hours as needed. for pain           Allergies (Verified)    Sulfa antibiotics; Ramipril; and Versed [midazolam]  Past Medical History Past Medical History:  Diagnosis Date  . Anemia, iron deficiency On procrit  . CAP (community acquired pneumonia)   . CKD (chronic kidney disease) stage 3, GFR 30-59 ml/min (HCC)   . Degenerative arthritis   . Depression   . Dyslipidemia   . Gastroesophageal reflux disease   . Gastroparesis diabeticorum ([place])   . Hematuria, microscopic 10/09   work up negative (Dr. Amalia Hailey)  . HIV positive ([place])   . Hyperkalemia, diminished renal excretion 06/2011 secondary  to TMP/SMZ; prior secondary to  ARBS;    Known potassium excretory defect; history of recurrent hyperkalemia due to diabetic renal disease; ACE/ARB contraindicated; hyperkalemia 06/2011 secondary to TMP-SMZ  . Hypothyroidism   . IDDM (insulin dependent diabetes mellitus) ([place])    38 years  . Low HDL (under 40)   . Proteinuria   . Retinopathy    x2  . SIRS (systemic inflammatory response syndrome) (HCC)      Past Surgical History:  Procedure Laterality Date  . CATARACT EXTRACTION Right   . COLONOSCOPY    . ESOPHAGOGASTRODUODENOSCOPY    . EYE SURGERY  2006,2001   x2   . HIP ARTHROPLASTY Right 05/10/2016   Procedure: RIGHT HIP HEMIARTHROPLASTY;  Surgeon: Marchia Bond, MD;  Location: [facility];  Service: Orthopedics;  Laterality: Right;  . insulin pump    . LASIK Bilateral   . VIDEO BRONCHOSCOPY Bilateral 12/15/2012   Procedure: VIDEO BRONCHOSCOPY WITH FLUORO;  Surgeon: Kathee Delton, MD;  Location: [facility];  Service: Cardiopulmonary;  Laterality: Bilateral;  . VITRECTOMY  bilateral    Social History   Socioeconomic History  . Marital status: Single    Spouse name: Not on file  . Number of children: 0  . Years of education: AAS  . Highest education level: Not on file  Occupational History  . Occupation: EMT    Employer: [place]: [employer]  Social Needs  . Financial resource strain: Not hard at all  . Food insecurity:    Worry: Never true    Inability: Never true  . Transportation needs:    Medical: No    Non-medical: No  Tobacco Use  . Smoking status: Never Smoker  . Smokeless tobacco: Never Used  Substance and Sexual Activity  . Alcohol use: No    Alcohol/week: 0.0 standard drinks    Comment: Infrequent, less than 1 x per month.  . Drug use: No  . Sexual activity: Not Currently    Comment: declined  condoms  Lifestyle  . Physical activity:    Days per week: 0 days    Minutes per session: 0 min  . Stress: Not at all  Relationships   . Social connections:    Talks on phone: More than three times a week    Gets together: More than three times a week    Attends religious service: More than 4 times per year    Active member of club or organization: Yes    Attends meetings of clubs or organizations: More than 4 times per year    Relationship status: Never married  Other Topics Concern  . Not on file  Social History Narrative   Single.  Lives at home with mom.  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      Family History  Problem Relation Age of Onset  . Diabetes type I Brother   . Prostate cancer Other   . Dementia Other   . Dementia Other   . Dementia Other       Labs/Other Tests and Data Reviewed:    Wt Readings from Last 3 Encounters:  01/19/18 211 lb (95.7 kg)  10/27/17 213 lb 12.8 oz (97 kg)  09/03/17 212 lb (96.2 kg)   Temp Readings from Last 3 Encounters:  01/19/18 (!) 97 F (36.1 C) (Oral)  10/27/17 (!) 97.5 F (36.4 C)  09/03/17 (!) 97.5 F (36.4 C) (Oral)   BP Readings from Last 3 Encounters:  01/19/18 (!) 155/100  10/27/17 (!) 166/84  09/03/17 (!) 142/88   Pulse Readings from Last 3 Encounters:  01/19/18 90  10/27/17 76  09/03/17 79     Lab Results  Component Value Date   HGBA1C 7.2 (H) 01/19/2018   HGBA1C 7.4 (H) 09/03/2017   HGBA1C 7.5 (H) 04/23/2017   Lab Results  Component Value Date   MICROALBUR 100 07/22/2014   LDLCALC 81 01/19/2018   CREATININE 2.53 (H) 01/19/2018       Chemistry      Component Value Date/Time   NA 141 01/19/2018 1012   K 4.5 01/19/2018 1012   CL 101 01/19/2018 1012   CO2 20 01/19/2018 1012   BUN 36 (H) 01/19/2018 1012   CREATININE 2.53 (H) 01/19/2018 1012   CREATININE 2.40 (H) 09/05/2014 1637      Component Value Date/Time   CALCIUM 9.3 01/19/2018 1012   CALCIUM 10.1 08/10/2012 1143   ALKPHOS 103 01/19/2018 1012   AST 27 01/19/2018 1012   ALT 29 01/19/2018 1012   BILITOT 0.3 01/19/2018 1012         OBSERVATIONS/ OBJECTIVE:     The patient  was alert and relayed the information about his health completely and discussing his visits with endocrinology infectious disease and nephrology.  He has a nephrology appointment in June.  He recently had an endocrinology appointment and his A1c was 6.9%.  He sees the infectious disease doctor in September and that is Dr. Linus Salmons.  He only sees him once a year.  Physical exam deferred due to nature of telephonic visit.  ASSESSMENT & PLAN    Time:   Today, I have spent 28 minutes with the patient via telephone discussing the above including Covid precautions.     Visit Diagnoses: 1. Vitamin D deficiency -Continue current vitamin D treatment pending results of lab work  2. Testosterone deficiency -Continue with testosterone replacement  3. Hypertension associated with diabetes ([place]) -No blood pressure readings available by patient and I encouraged him to at least take  a reading once a month.  4. Uncontrolled type 1 diabetes mellitus with stage 3 chronic kidney disease ([place]) -Patient had some low blood sugar so his endocrinologist did make some changes on his because of this and hopefully there will be less low readings as a result of this.  5. Pure hypercholesterolemia -Continue current treatment pending results of lab work along with aggressive therapeutic lifestyle changes including diet and exercise  6. Chronic kidney disease, stage 3, mod decreased GFR (HCC) -Continue to follow-up with nephrology and visit coming up soon in June  7. HIV disease ([place]) -Follow-up with infectious disease in September  8. OSA (obstructive sleep apnea) -Continue treatment  9. Non-seasonal allergic rhinitis due to pollen -Continue Zyrtec and Flonase and Atrovent nasal spray  10. Acquired hypothyroidism -Continue with current dose of levothyroxine and check thyroid profile with blood draw  11.  Status post right hip fracture -No complaints with this and doing well.  Patient Instructions  Continue  to check blood pressures periodically watch sodium intake and diet closely Follow-up with nephrology endocrinology and infectious disease as planned Do not forget to get your colonoscopy with Dr. Collene Mares after you turn 48 Continue to practice good hand and respiratory hygiene because of COVID-19 Drink plenty of fluids and stay well-hydrated      The above assessment and management plan was discussed with the patient. The patient verbalized understanding of and has agreed to the management plan. Patient is aware to call the clinic if symptoms persist or worsen. Patient is aware when to return to the clinic for a follow-up visit. Patient educated on when it is appropriate to go to the emergency department.    Chipper Herb, MD [address] [place], [place], [place] [address] Ph [phone]   Arrie Senate MD

## 2018-06-10 ENCOUNTER — Other Ambulatory Visit: Payer: Self-pay | Admitting: *Deleted

## 2018-06-10 ENCOUNTER — Other Ambulatory Visit: Payer: Self-pay

## 2018-06-10 ENCOUNTER — Encounter: Payer: Self-pay | Admitting: Family Medicine

## 2018-06-10 ENCOUNTER — Other Ambulatory Visit: Payer: Medicare Other

## 2018-06-10 DIAGNOSIS — N183 Chronic kidney disease, stage 3 unspecified: Secondary | ICD-10-CM

## 2018-06-10 DIAGNOSIS — E039 Hypothyroidism, unspecified: Secondary | ICD-10-CM | POA: Diagnosis not present

## 2018-06-10 DIAGNOSIS — E7800 Pure hypercholesterolemia, unspecified: Secondary | ICD-10-CM

## 2018-06-10 DIAGNOSIS — G4733 Obstructive sleep apnea (adult) (pediatric): Secondary | ICD-10-CM

## 2018-06-10 DIAGNOSIS — E1159 Type 2 diabetes mellitus with other circulatory complications: Secondary | ICD-10-CM

## 2018-06-10 DIAGNOSIS — I10 Essential (primary) hypertension: Secondary | ICD-10-CM

## 2018-06-10 DIAGNOSIS — E559 Vitamin D deficiency, unspecified: Secondary | ICD-10-CM

## 2018-06-10 DIAGNOSIS — B20 Human immunodeficiency virus [HIV] disease: Secondary | ICD-10-CM

## 2018-06-10 DIAGNOSIS — Z8781 Personal history of (healed) traumatic fracture: Secondary | ICD-10-CM

## 2018-06-10 DIAGNOSIS — E1065 Type 1 diabetes mellitus with hyperglycemia: Secondary | ICD-10-CM

## 2018-06-10 DIAGNOSIS — IMO0002 Reserved for concepts with insufficient information to code with codable children: Secondary | ICD-10-CM

## 2018-06-10 DIAGNOSIS — E349 Endocrine disorder, unspecified: Secondary | ICD-10-CM

## 2018-06-10 DIAGNOSIS — J301 Allergic rhinitis due to pollen: Secondary | ICD-10-CM

## 2018-06-10 DIAGNOSIS — E1022 Type 1 diabetes mellitus with diabetic chronic kidney disease: Secondary | ICD-10-CM

## 2018-06-10 MED ORDER — FLUCONAZOLE 150 MG PO TABS: 150.0000 mg | ORAL_TABLET | Freq: Every day | ORAL | 0 refills | Status: DC

## 2018-06-11 LAB — BMP8+EGFR
BUN/Creatinine Ratio: 16 (ref 9–20)
BUN: 47 mg/dL — ABNORMAL HIGH (ref 6–24)
CO2: 18 mmol/L — ABNORMAL LOW (ref 20–29)
Calcium: 9.3 mg/dL (ref 8.7–10.2)
Chloride: 100 mmol/L (ref 96–106)
Creatinine, Ser: 2.96 mg/dL — ABNORMAL HIGH (ref 0.76–1.27)
GFR calc Af Amer: 27 mL/min/{1.73_m2} — ABNORMAL LOW (ref >59)
GFR calc non Af Amer: 24 mL/min/{1.73_m2} — ABNORMAL LOW (ref >59)
Glucose: 177 mg/dL — ABNORMAL HIGH (ref 65–99)
Potassium: 4.5 mmol/L (ref 3.5–5.2)
Sodium: 137 mmol/L (ref 134–144)

## 2018-06-11 LAB — CBC WITH DIFFERENTIAL/PLATELET
Basophils Absolute: 0.1 10*3/uL (ref 0.0–0.2)
Basos: 1 %
EOS (ABSOLUTE): 0.3 10*3/uL (ref 0.0–0.4)
Eos: 5 %
Hematocrit: 33.4 % — ABNORMAL LOW (ref 37.5–51.0)
Hemoglobin: 12.1 g/dL — ABNORMAL LOW (ref 13.0–17.7)
Immature Grans (Abs): 0 10*3/uL (ref 0.0–0.1)
Immature Granulocytes: 1 %
Lymphocytes Absolute: 2.3 10*3/uL (ref 0.7–3.1)
Lymphs: 33 %
MCH: 34.8 pg — ABNORMAL HIGH (ref 26.6–33.0)
MCHC: 36.2 g/dL — ABNORMAL HIGH (ref 31.5–35.7)
MCV: 96 fL (ref 79–97)
Monocytes Absolute: 0.7 10*3/uL (ref 0.1–0.9)
Monocytes: 10 %
Neutrophils Absolute: 3.6 10*3/uL (ref 1.4–7.0)
Neutrophils: 50 %
Platelets: 265 10*3/uL (ref 150–450)
RBC: 3.48 x10E6/uL — ABNORMAL LOW (ref 4.14–5.80)
RDW: 13.3 % (ref 11.6–15.4)
WBC: 7 10*3/uL (ref 3.4–10.8)

## 2018-06-11 LAB — HEPATIC FUNCTION PANEL
ALT: 45 IU/L — ABNORMAL HIGH (ref 0–44)
AST: 27 IU/L (ref 0–40)
Albumin: 4.2 g/dL (ref 4.0–5.0)
Alkaline Phosphatase: 95 IU/L (ref 39–117)
Bilirubin Total: 0.3 mg/dL (ref 0.0–1.2)
Bilirubin, Direct: 0.09 mg/dL (ref 0.00–0.40)
Total Protein: 6.5 g/dL (ref 6.0–8.5)

## 2018-06-11 LAB — THYROID PANEL WITH TSH
Free Thyroxine Index: 2.5 (ref 1.2–4.9)
T3 Uptake Ratio: 27 % (ref 24–39)
T4, Total: 9.1 ug/dL (ref 4.5–12.0)
TSH: 0.492 u[IU]/mL (ref 0.450–4.500)

## 2018-06-11 LAB — LIPID PANEL
Chol/HDL Ratio: 3.2 ratio (ref 0.0–5.0)
Cholesterol, Total: 112 mg/dL (ref 100–199)
HDL: 35 mg/dL — ABNORMAL LOW (ref >39)
LDL Calculated: 48 mg/dL (ref 0–99)
Triglycerides: 143 mg/dL (ref 0–149)
VLDL Cholesterol Cal: 29 mg/dL (ref 5–40)

## 2018-06-11 LAB — VITAMIN D 25 HYDROXY (VIT D DEFICIENCY, FRACTURES): Vit D, 25-Hydroxy: 23.8 ng/mL — ABNORMAL LOW (ref 30.0–100.0)

## 2018-07-03 ENCOUNTER — Other Ambulatory Visit: Payer: Self-pay | Admitting: Family Medicine

## 2018-07-12 ENCOUNTER — Other Ambulatory Visit: Payer: Self-pay | Admitting: Family Medicine

## 2018-07-13 ENCOUNTER — Other Ambulatory Visit: Payer: Self-pay | Admitting: Family Medicine

## 2018-07-17 ENCOUNTER — Other Ambulatory Visit: Payer: Self-pay | Admitting: Family Medicine

## 2018-07-22 ENCOUNTER — Telehealth: Payer: Self-pay | Admitting: Family Medicine

## 2018-07-22 NOTE — Telephone Encounter (Signed)
Pt is wanting to speak to Patrick Brown about being transferred over to see dr dettinger since dr Laurance Flatten is leaving

## 2018-07-22 NOTE — Telephone Encounter (Signed)
Spoke with pt and changed PCP to dettinger and also moved pt's September appt off of [facility] to [facility] schedule.

## 2018-07-30 ENCOUNTER — Other Ambulatory Visit: Payer: Self-pay | Admitting: Physician Assistant

## 2018-07-30 DIAGNOSIS — N183 Chronic kidney disease, stage 3 (moderate): Secondary | ICD-10-CM | POA: Diagnosis not present

## 2018-07-30 DIAGNOSIS — J301 Allergic rhinitis due to pollen: Secondary | ICD-10-CM

## 2018-08-01 DIAGNOSIS — G4733 Obstructive sleep apnea (adult) (pediatric): Secondary | ICD-10-CM | POA: Diagnosis not present

## 2018-08-17 ENCOUNTER — Encounter: Payer: Self-pay | Admitting: Family Medicine

## 2018-08-17 ENCOUNTER — Ambulatory Visit (INDEPENDENT_AMBULATORY_CARE_PROVIDER_SITE_OTHER): Payer: Medicare Other | Admitting: Family Medicine

## 2018-08-17 ENCOUNTER — Other Ambulatory Visit: Payer: Self-pay

## 2018-08-17 VITALS — BP 177/99 | HR 80 | Temp 97.3°F | Ht 70.0 in | Wt 212.4 lb

## 2018-08-17 DIAGNOSIS — L739 Follicular disorder, unspecified: Secondary | ICD-10-CM

## 2018-08-17 MED ORDER — DOXYCYCLINE HYCLATE 100 MG PO TABS: 100.0000 mg | ORAL_TABLET | Freq: Two times a day (BID) | ORAL | 0 refills | Status: DC

## 2018-08-17 MED ORDER — MUPIROCIN CALCIUM 2 % EX CREA: 1.0000 "application " | TOPICAL_CREAM | Freq: Two times a day (BID) | CUTANEOUS | 0 refills | Status: DC

## 2018-08-17 NOTE — Progress Notes (Signed)
BP (!) 177/99   Pulse 80   Temp (!) 97.3 F (36.3 C) (Oral)   Ht 5\' 10"  (1.778 m)   Wt 212 lb 6.4 oz (96.3 kg)   BMI 30.48 kg/m    Subjective:   Patient ID: Patrick Brown, male    DOB: 07/11/1970, 48 y.o.   MRN: [id number]  HPI: Patrick Brown is a 48 y.o. male presenting on 08/17/2018 for knot below right ear (Patient states he noticed it friday and now it has started itching.)   HPI Patient is coming in with complaints of infected follicle is been red or warm looks like a pimple on the right side of his face just below his ear has been going on over the past 3 days.  He is concerned because he does have immunosuppressed state that these infections can become severe.  Patient denies any fevers or chills or redness or warmth anywhere else but just says that spot has become pruritic and swells up and becomes red.  Relevant past medical, surgical, family and social history reviewed and updated as indicated. Interim medical history since our last visit reviewed. Allergies and medications reviewed and updated.  Review of Systems  Constitutional: Negative for chills and fever.  Eyes: Negative for visual disturbance.  Respiratory: Negative for shortness of breath and wheezing.   Cardiovascular: Negative for chest pain and leg swelling.  Musculoskeletal: Negative for back pain and gait problem.  Skin: Negative for rash.  Neurological: Negative for dizziness, weakness and light-headedness.  All other systems reviewed and are negative.   Per HPI unless specifically indicated above   Allergies as of 08/17/2018      Reactions   Sulfa Antibiotics Other (See Comments)   High potassium   Ramipril Cough   Versed [midazolam] Other (See Comments)   "I don't wake up very good or clear it out of my system"      Medication List       Accurate as of August 17, 2018 12:06 PM. If you have any questions, ask your nurse or doctor.        STOP taking these medications   fluconazole 150 MG  tablet Commonly known as: DIFLUCAN Stopped by: Fransisca Kaufmann Emika Tiano, MD     TAKE these medications   abacavir-dolutegravir-lamiVUDine 600-50-300 MG tablet Commonly known as: Triumeq Take 1 tablet by mouth daily.   acetaminophen 500 MG tablet Commonly known as: TYLENOL Take 1,000 mg by mouth 2 (two) times daily.   acyclovir 400 MG tablet Commonly known as: ZOVIRAX TAKE 1 TABLET (400 MG TOTAL) BY MOUTH 2 (TWO) TIMES DAILY.   AgaMatrix Ultra-Thin Lancets Misc TEST BS 4 TIMES A DAY AND AS NEEDED E10.65   aspirin 81 MG chewable tablet Chew 81 mg by mouth every morning.   cetirizine 10 MG tablet Commonly known as: ZYRTEC TAKE 1 TABLET BY MOUTH EVERY DAY   cyclobenzaprine 10 MG tablet Commonly known as: FLEXERIL Take 1 tablet (10 mg total) by mouth 3 (three) times daily as needed for muscle spasms.   diclofenac sodium 1 % Gel Commonly known as: VOLTAREN Apply 2 g topically 4 (four) times daily.   doxycycline 100 MG tablet Commonly known as: VIBRA-TABS Take 1 tablet (100 mg total) by mouth 2 (two) times daily. 1 po bid Started by: Fransisca Kaufmann Samaya Boardley, MD   DULoxetine 60 MG capsule Commonly known as: CYMBALTA TAKE 1 CAPSULE (60 MG TOTAL) BY MOUTH 2 (TWO) TIMES DAILY. AS DIRECTED   esomeprazole 40 MG  capsule Commonly known as: NEXIUM TAKE 1 CAPSULE BY MOUTH EVERY DAY   febuxostat 40 MG tablet Commonly known as: ULORIC TAKE 1 TABLET BY MOUTH EVERY DAY   ferrous sulfate 325 (65 FE) MG tablet Take 650 mg by mouth daily with breakfast.   fludrocortisone 0.1 MG tablet Commonly known as: FLORINEF Take 0.1 mg by mouth daily.   fluticasone 50 MCG/ACT nasal spray Commonly known as: FLONASE SPRAY 2 SPRAYS INTO EACH NOSTRIL EVERY DAY   furosemide 40 MG tablet Commonly known as: LASIX Take 20 mg by mouth 2 (two) times daily.   GLUCOSAMINE 1500 COMPLEX PO Take 1 tablet by mouth 2 (two) times daily.   glucose blood test strip Commonly known as: OneTouch Verio TEST BLOOD  SUGAR 4 TIMES DAILY AND AS NEEDED   insulin lispro 100 UNIT/ML injection Commonly known as: HumaLOG USE 42 UNITS TO 120 UNITS PER PUMP DAILY AS DIRECTED   ipratropium 0.03 % nasal spray Commonly known as: ATROVENT USE 2 SPRAY IN EACH NOSTRIL 2-3 TIMES DAILY   levothyroxine 175 MCG tablet Commonly known as: SYNTHROID Take 1 tablet (175 mcg total) by mouth daily before breakfast.   LORazepam 0.5 MG tablet Commonly known as: ATIVAN TAKE 1 TABLET (0.5 MG TOTAL) BY MOUTH DAILY AS NEEDED.   losartan 25 MG tablet Commonly known as: COZAAR Take 50 mg by mouth daily.   meclizine 12.5 MG tablet Commonly known as: ANTIVERT Take 1 tablet (12.5 mg total) by mouth 3 (three) times daily as needed for dizziness.   metoCLOPramide 5 MG tablet Commonly known as: REGLAN TAKE 1 TABLET BY MOUTH 3 TIMES A DAY BEFORE MEALS   mupirocin cream 2 % Commonly known as: Bactroban Apply 1 application topically 2 (two) times daily. Started by: Fransisca Kaufmann Leatha Rohner, MD   niacin 1000 MG CR tablet Commonly known as: NIASPAN TAKE 1 TABLET (1,000 MG TOTAL) BY MOUTH AT BEDTIME. What changed: See the new instructions.   ondansetron 4 MG tablet Commonly known as: ZOFRAN Take 1 tablet (4 mg total) by mouth every 8 (eight) hours as needed for nausea.   OVER THE COUNTER MEDICATION Take 1 capsule by mouth daily. [name]- probiotic daily   simvastatin 40 MG tablet Commonly known as: ZOCOR TAKE 1 TABLET BY MOUTH EVERYDAY AT BEDTIME   Testosterone 20.25 MG/ACT (1.62%) Gel APPLY 3 PUMPS DAILY AS DIRECTED   traMADol 50 MG tablet Commonly known as: ULTRAM Take 1 tablet (50 mg total) by mouth every 12 (twelve) hours as needed. for pain   Vascepa 1 g Caps Generic drug: Icosapent Ethyl TAKE 2 CAPSULES (2 G TOTAL) BY MOUTH 2 (TWO) TIMES DAILY.        Objective:   BP (!) 177/99   Pulse 80   Temp (!) 97.3 F (36.3 C) (Oral)   Ht 5\' 10"  (1.778 m)   Wt 212 lb 6.4 oz (96.3 kg)   BMI 30.48 kg/m   Wt  Readings from Last 3 Encounters:  08/17/18 212 lb 6.4 oz (96.3 kg)  01/19/18 211 lb (95.7 kg)  10/27/17 213 lb 12.8 oz (97 kg)    Physical Exam Vitals signs and nursing note reviewed.  Constitutional:      General: He is not in acute distress.    Appearance: He is well-developed. He is not diaphoretic.  Eyes:     General: No scleral icterus.    Conjunctiva/sclera: Conjunctivae normal.  Neck:     Musculoskeletal: Neck supple.     Thyroid: No thyromegaly.  Cardiovascular:     Rate and Rhythm: Normal rate and regular rhythm.     Heart sounds: Normal heart sounds. No murmur.  Pulmonary:     Effort: Pulmonary effort is normal. No respiratory distress.     Breath sounds: Normal breath sounds. No wheezing.  Musculoskeletal: Normal range of motion.  Lymphadenopathy:     Cervical: No cervical adenopathy.  Skin:    General: Skin is warm and dry.     Findings: No rash.  Neurological:     Mental Status: He is alert and oriented to person, place, and time.     Coordination: Coordination normal.  Psychiatric:        Behavior: Behavior normal.       Assessment & Plan:   Problem List Items Addressed This Visit    None    Visit Diagnoses    Folliculitis    -  Primary   Relevant Medications   mupirocin cream (BACTROBAN) 2 %   doxycycline (VIBRA-TABS) 100 MG tablet      Recommended to try the topical antibiotic first but because of immunosuppressed state also send in the doxycycline for surgical getting worse take the doxycycline. Follow up plan: Return if symptoms worsen or fail to improve.  Counseling provided for all of the vaccine components No orders of the defined types were placed in this encounter.   Caryl Pina, MD [facility] 08/17/2018, 12:06 PM

## 2018-08-19 ENCOUNTER — Encounter: Payer: Self-pay | Admitting: Internal Medicine

## 2018-08-19 ENCOUNTER — Encounter: Payer: Self-pay | Admitting: Family Medicine

## 2018-08-20 ENCOUNTER — Telehealth: Payer: Self-pay | Admitting: Family Medicine

## 2018-08-20 NOTE — Telephone Encounter (Signed)
Letter sent on mychart - patient aware.

## 2018-08-21 ENCOUNTER — Other Ambulatory Visit: Payer: Self-pay | Admitting: Family Medicine

## 2018-08-23 ENCOUNTER — Encounter: Payer: Self-pay | Admitting: Family Medicine

## 2018-08-31 DIAGNOSIS — I129 Hypertensive chronic kidney disease with stage 1 through stage 4 chronic kidney disease, or unspecified chronic kidney disease: Secondary | ICD-10-CM | POA: Diagnosis not present

## 2018-08-31 DIAGNOSIS — Z794 Long term (current) use of insulin: Secondary | ICD-10-CM | POA: Diagnosis not present

## 2018-08-31 DIAGNOSIS — N183 Chronic kidney disease, stage 3 (moderate): Secondary | ICD-10-CM | POA: Diagnosis not present

## 2018-08-31 DIAGNOSIS — E1022 Type 1 diabetes mellitus with diabetic chronic kidney disease: Secondary | ICD-10-CM | POA: Diagnosis not present

## 2018-08-31 DIAGNOSIS — E10649 Type 1 diabetes mellitus with hypoglycemia without coma: Secondary | ICD-10-CM | POA: Diagnosis not present

## 2018-09-01 ENCOUNTER — Other Ambulatory Visit: Payer: Self-pay | Admitting: Family Medicine

## 2018-09-01 DIAGNOSIS — E1022 Type 1 diabetes mellitus with diabetic chronic kidney disease: Secondary | ICD-10-CM

## 2018-09-01 DIAGNOSIS — IMO0002 Reserved for concepts with insufficient information to code with codable children: Secondary | ICD-10-CM

## 2018-09-10 ENCOUNTER — Other Ambulatory Visit: Payer: Self-pay | Admitting: Family Medicine

## 2018-09-29 ENCOUNTER — Other Ambulatory Visit: Payer: Self-pay | Admitting: Family Medicine

## 2018-10-01 ENCOUNTER — Other Ambulatory Visit: Payer: Self-pay | Admitting: Family Medicine

## 2018-10-02 NOTE — Telephone Encounter (Signed)
Aware. 

## 2018-10-02 NOTE — Telephone Encounter (Signed)
This is a controlled substance, patient needs an appointment for this refill.

## 2018-10-02 NOTE — Telephone Encounter (Signed)
Message left with family to ask him to call our office to schedule an appointment with his provider to et refills.

## 2018-10-06 ENCOUNTER — Other Ambulatory Visit: Payer: Self-pay

## 2018-10-07 ENCOUNTER — Encounter: Payer: Self-pay | Admitting: Family Medicine

## 2018-10-07 ENCOUNTER — Ambulatory Visit (INDEPENDENT_AMBULATORY_CARE_PROVIDER_SITE_OTHER): Payer: Medicare Other | Admitting: Family Medicine

## 2018-10-07 ENCOUNTER — Ambulatory Visit: Payer: Medicare Other | Admitting: Family Medicine

## 2018-10-07 VITALS — BP 154/87 | HR 93 | Temp 98.0°F | Ht 70.0 in | Wt 210.0 lb

## 2018-10-07 DIAGNOSIS — Z23 Encounter for immunization: Secondary | ICD-10-CM | POA: Diagnosis not present

## 2018-10-07 DIAGNOSIS — E7800 Pure hypercholesterolemia, unspecified: Secondary | ICD-10-CM | POA: Diagnosis not present

## 2018-10-07 DIAGNOSIS — E039 Hypothyroidism, unspecified: Secondary | ICD-10-CM | POA: Diagnosis not present

## 2018-10-07 DIAGNOSIS — E349 Endocrine disorder, unspecified: Secondary | ICD-10-CM

## 2018-10-07 MED ORDER — TESTOSTERONE 20.25 MG/ACT (1.62%) TD GEL: TRANSDERMAL | 2 refills | Status: DC

## 2018-10-07 MED ORDER — DICLOFENAC SODIUM 1 % TD GEL: 2.0000 g | Freq: Four times a day (QID) | TRANSDERMAL | 3 refills | Status: DC

## 2018-10-07 NOTE — Progress Notes (Signed)
BP (!) 154/87   Pulse 93   Temp 98 F (36.7 C) (Temporal)   Ht 5\' 10"  (1.778 m)   Wt 210 lb (95.3 kg)   BMI 30.13 kg/m    Subjective:   Patient ID: Patrick Brown, male    DOB: 10-01-1970, 48 y.o.   MRN: [id number]  HPI: Patrick Brown is a 48 y.o. male presenting on 10/07/2018 for Establish Care and Medical Management of Chronic Issues   HPI Patient comes in today for recheck and refill of testosterone.  He is testosterone for testosterone deficiency and says that it works well for the most part.  He denies any major issues with it.  He does did have a blood count and that looked normal.  Hypothyroidism recheck Patient is coming in for thyroid recheck today as well. They deny any issues with hair changes or heat or cold problems or diarrhea or constipation. They deny any chest pain or palpitations. They are currently on levothyroxine 157micrograms   Hyperlipidemia Patient is coming in for recheck of his hyperlipidemia. The patient is currently taking a statin and niacin and Vascepa. They deny any issues with myalgias or history of liver damage from it. They deny any focal numbness or weakness or chest pain.   Relevant past medical, surgical, family and social history reviewed and updated as indicated. Interim medical history since our last visit reviewed. Allergies and medications reviewed and updated.  Review of Systems  Constitutional: Negative for chills and fever.  Respiratory: Negative for shortness of breath and wheezing.   Cardiovascular: Negative for chest pain and leg swelling.  Musculoskeletal: Negative for back pain and gait problem.  Skin: Negative for rash.  Neurological: Negative for dizziness, weakness and light-headedness.  All other systems reviewed and are negative.   Per HPI unless specifically indicated above   Allergies as of 10/07/2018      Reactions   Sulfa Antibiotics Other (See Comments)   High potassium   Ramipril Cough   Versed [midazolam] Other  (See Comments)   "I don't wake up very good or clear it out of my system"      Medication List       Accurate as of October 07, 2018  8:59 AM. If you have any questions, ask your nurse or doctor.        STOP taking these medications   doxycycline 100 MG tablet Commonly known as: VIBRA-TABS Stopped by: Fransisca Kaufmann Elvin Banker, MD     TAKE these medications   abacavir-dolutegravir-lamiVUDine 600-50-300 MG tablet Commonly known as: Triumeq Take 1 tablet by mouth daily.   acetaminophen 500 MG tablet Commonly known as: TYLENOL Take 1,000 mg by mouth 2 (two) times daily.   acyclovir 400 MG tablet Commonly known as: ZOVIRAX TAKE 1 TABLET (400 MG TOTAL) BY MOUTH 2 (TWO) TIMES DAILY.   AgaMatrix Ultra-Thin Lancets Misc Test BS 4 times a day and prn Dx E10.65   amLODipine 10 MG tablet Commonly known as: NORVASC Take 1 tablet by mouth daily.   aspirin 81 MG chewable tablet Chew 81 mg by mouth every morning.   cetirizine 10 MG tablet Commonly known as: ZYRTEC TAKE 1 TABLET BY MOUTH EVERY DAY   cyclobenzaprine 10 MG tablet Commonly known as: FLEXERIL Take 1 tablet (10 mg total) by mouth 3 (three) times daily as needed for muscle spasms.   diclofenac sodium 1 % Gel Commonly known as: VOLTAREN Apply 2 g topically 4 (four) times daily.   DULoxetine 60 MG  capsule Commonly known as: CYMBALTA TAKE 1 CAPSULE (60 MG TOTAL) BY MOUTH 2 (TWO) TIMES DAILY. AS DIRECTED   esomeprazole 40 MG capsule Commonly known as: NEXIUM TAKE 1 CAPSULE BY MOUTH EVERY DAY   febuxostat 40 MG tablet Commonly known as: ULORIC TAKE 1 TABLET BY MOUTH EVERY DAY   ferrous sulfate 325 (65 FE) MG tablet Take 650 mg by mouth daily with breakfast.   fludrocortisone 0.1 MG tablet Commonly known as: FLORINEF Take 0.1 mg by mouth daily.   fluticasone 50 MCG/ACT nasal spray Commonly known as: FLONASE SPRAY 2 SPRAYS INTO EACH NOSTRIL EVERY DAY   furosemide 40 MG tablet Commonly known as: LASIX Take 20  mg by mouth 2 (two) times daily.   GLUCOSAMINE 1500 COMPLEX PO Take 1 tablet by mouth 2 (two) times daily.   glucose blood test strip Commonly known as: OneTouch Verio TEST BLOOD SUGAR 4 TIMES DAILY AND AS NEEDED   insulin lispro 100 UNIT/ML injection Commonly known as: HumaLOG USE 42 UNITS TO 120 UNITS PER PUMP DAILY AS DIRECTED   ipratropium 0.03 % nasal spray Commonly known as: ATROVENT USE 2 SPRAY IN EACH NOSTRIL 2-3 TIMES DAILY   levothyroxine 175 MCG tablet Commonly known as: SYNTHROID Take 1 tablet (175 mcg total) by mouth daily before breakfast.   LORazepam 0.5 MG tablet Commonly known as: ATIVAN TAKE 1 TABLET (0.5 MG TOTAL) BY MOUTH DAILY AS NEEDED.   losartan 100 MG tablet Commonly known as: COZAAR Take 100 mg by mouth daily. What changed: Another medication with the same name was removed. Continue taking this medication, and follow the directions you see here. Changed by: Worthy Rancher, MD   meclizine 12.5 MG tablet Commonly known as: ANTIVERT Take 1 tablet (12.5 mg total) by mouth 3 (three) times daily as needed for dizziness.   metoCLOPramide 5 MG tablet Commonly known as: REGLAN TAKE 1 TABLET BY MOUTH 3 TIMES A DAY BEFORE MEALS   mupirocin cream 2 % Commonly known as: Bactroban Apply 1 application topically 2 (two) times daily.   niacin 1000 MG CR tablet Commonly known as: NIASPAN TAKE 1 TABLET (1,000 MG TOTAL) BY MOUTH AT BEDTIME. What changed: See the new instructions.   ondansetron 4 MG tablet Commonly known as: ZOFRAN Take 1 tablet (4 mg total) by mouth every 8 (eight) hours as needed for nausea.   OVER THE COUNTER MEDICATION Take 1 capsule by mouth daily. [name]- probiotic daily   simvastatin 40 MG tablet Commonly known as: ZOCOR TAKE 1 TABLET BY MOUTH EVERYDAY AT BEDTIME   Testosterone 20.25 MG/ACT (1.62%) Gel APPLY 3 PUMPS DAILY AS DIRECTED   traMADol 50 MG tablet Commonly known as: ULTRAM Take 1 tablet (50 mg total) by mouth  every 12 (twelve) hours as needed. for pain   Vascepa 1 g Caps Generic drug: Icosapent Ethyl TAKE 2 CAPSULES (2 G TOTAL) BY MOUTH 2 (TWO) TIMES DAILY.        Objective:   BP (!) 154/87   Pulse 93   Temp 98 F (36.7 C) (Temporal)   Ht 5\' 10"  (1.778 m)   Wt 210 lb (95.3 kg)   BMI 30.13 kg/m   Wt Readings from Last 3 Encounters:  10/07/18 210 lb (95.3 kg)  08/17/18 212 lb 6.4 oz (96.3 kg)  01/19/18 211 lb (95.7 kg)    Physical Exam Vitals signs and nursing note reviewed.  Constitutional:      General: He is not in acute distress.  Appearance: He is well-developed. He is not diaphoretic.  Eyes:     General: No scleral icterus.    Conjunctiva/sclera: Conjunctivae normal.  Neck:     Musculoskeletal: Neck supple.     Thyroid: No thyromegaly.  Cardiovascular:     Rate and Rhythm: Normal rate and regular rhythm.     Heart sounds: Normal heart sounds. No murmur.  Pulmonary:     Effort: Pulmonary effort is normal. No respiratory distress.     Breath sounds: Normal breath sounds. No wheezing.  Musculoskeletal: Normal range of motion.  Lymphadenopathy:     Cervical: No cervical adenopathy.  Skin:    General: Skin is warm and dry.     Findings: No rash.  Neurological:     Mental Status: He is alert and oriented to person, place, and time.     Coordination: Coordination normal.  Psychiatric:        Behavior: Behavior normal.       Assessment & Plan:   Problem List Items Addressed This Visit      Endocrine   Acquired hypothyroidism   Relevant Orders   TSH    Other Visit Diagnoses    Testosterone deficiency    -  Primary   Relevant Medications   Testosterone 20.25 MG/ACT (1.62%) GEL   Pure hypercholesterolemia       Relevant Medications   losartan (COZAAR) 100 MG tablet   amLODipine (NORVASC) 10 MG tablet   Other Relevant Orders   Lipid panel      Continue testosterone and Voltaren and thyroid, he sees his nephrologist for his blood pressure and has an  endocrinologist for diabetes.  He also has infectious disease for his HIV. Follow up plan: Return in about 3 months (around 01/06/2019), or if symptoms worsen or fail to improve, for Thyroid and cholesterol recheck.  Counseling provided for all of the vaccine components Orders Placed This Encounter  Procedures  . TSH  . Lipid panel    Caryl Pina, MD [facility] 10/07/2018, 8:59 AM

## 2018-10-08 DIAGNOSIS — E1022 Type 1 diabetes mellitus with diabetic chronic kidney disease: Secondary | ICD-10-CM | POA: Diagnosis not present

## 2018-10-08 DIAGNOSIS — I129 Hypertensive chronic kidney disease with stage 1 through stage 4 chronic kidney disease, or unspecified chronic kidney disease: Secondary | ICD-10-CM | POA: Diagnosis not present

## 2018-10-08 DIAGNOSIS — N183 Chronic kidney disease, stage 3 (moderate): Secondary | ICD-10-CM | POA: Diagnosis not present

## 2018-10-08 LAB — LIPID PANEL
Chol/HDL Ratio: 3.4 ratio (ref 0.0–5.0)
Cholesterol, Total: 141 mg/dL (ref 100–199)
HDL: 41 mg/dL (ref >39)
LDL Chol Calc (NIH): 71 mg/dL (ref 0–99)
Triglycerides: 169 mg/dL — ABNORMAL HIGH (ref 0–149)
VLDL Cholesterol Cal: 29 mg/dL (ref 5–40)

## 2018-10-08 LAB — TSH: TSH: 14.9 u[IU]/mL — ABNORMAL HIGH (ref 0.450–4.500)

## 2018-10-15 ENCOUNTER — Other Ambulatory Visit: Payer: Self-pay | Admitting: *Deleted

## 2018-10-15 MED ORDER — LEVOTHYROXINE SODIUM 200 MCG PO TABS: 200.0000 ug | ORAL_TABLET | Freq: Every day | ORAL | 1 refills | Status: DC

## 2018-10-21 ENCOUNTER — Other Ambulatory Visit: Payer: Self-pay | Admitting: Family Medicine

## 2018-10-21 DIAGNOSIS — J301 Allergic rhinitis due to pollen: Secondary | ICD-10-CM

## 2018-10-28 ENCOUNTER — Ambulatory Visit: Payer: Medicare Other | Admitting: Internal Medicine

## 2018-10-28 DIAGNOSIS — N183 Chronic kidney disease, stage 3 (moderate): Secondary | ICD-10-CM | POA: Diagnosis not present

## 2018-10-29 DIAGNOSIS — E109 Type 1 diabetes mellitus without complications: Secondary | ICD-10-CM | POA: Diagnosis not present

## 2018-10-30 DIAGNOSIS — G4733 Obstructive sleep apnea (adult) (pediatric): Secondary | ICD-10-CM | POA: Diagnosis not present

## 2018-11-02 DIAGNOSIS — E103551 Type 1 diabetes mellitus with stable proliferative diabetic retinopathy, right eye: Secondary | ICD-10-CM | POA: Diagnosis not present

## 2018-11-02 DIAGNOSIS — E1039 Type 1 diabetes mellitus with other diabetic ophthalmic complication: Secondary | ICD-10-CM | POA: Diagnosis not present

## 2018-11-02 DIAGNOSIS — H26491 Other secondary cataract, right eye: Secondary | ICD-10-CM | POA: Diagnosis not present

## 2018-11-02 DIAGNOSIS — E103552 Type 1 diabetes mellitus with stable proliferative diabetic retinopathy, left eye: Secondary | ICD-10-CM | POA: Diagnosis not present

## 2018-11-02 LAB — HM DIABETES EYE EXAM

## 2018-11-06 ENCOUNTER — Other Ambulatory Visit: Payer: Self-pay | Admitting: Family Medicine

## 2018-11-12 ENCOUNTER — Other Ambulatory Visit: Payer: Self-pay | Admitting: Internal Medicine

## 2018-11-15 ENCOUNTER — Other Ambulatory Visit: Payer: Self-pay | Admitting: Family Medicine

## 2018-11-17 DIAGNOSIS — N185 Chronic kidney disease, stage 5: Secondary | ICD-10-CM | POA: Diagnosis not present

## 2018-11-18 ENCOUNTER — Ambulatory Visit (INDEPENDENT_AMBULATORY_CARE_PROVIDER_SITE_OTHER): Payer: Medicare Other | Admitting: Family Medicine

## 2018-11-18 ENCOUNTER — Encounter: Payer: Self-pay | Admitting: Family Medicine

## 2018-11-18 DIAGNOSIS — K5902 Outlet dysfunction constipation: Secondary | ICD-10-CM | POA: Diagnosis not present

## 2018-11-18 MED ORDER — POLYETHYLENE GLYCOL 3350 17 GM/SCOOP PO POWD: 17.0000 g | Freq: Every day | ORAL | 1 refills | Status: DC | PRN

## 2018-11-18 NOTE — Progress Notes (Signed)
Virtual Visit via telephone Note  I connected with Patrick Brown on 11/18/18 at 0759 by telephone and verified that I am speaking with the correct person using two identifiers. Patrick Brown is currently located at home and no other people are currently with her during visit. The provider, Fransisca Kaufmann Porcha Deblanc, MD is located in their office at time of visit.  Call ended at 0815  I discussed the limitations, risks, security and privacy concerns of performing an evaluation and management service by telephone and the availability of in person appointments. I also discussed with the patient that there may be a patient responsible charge related to this service. The patient expressed understanding and agreed to proceed.   History and Present Illness: Patient is calling in with nausea and constipation and stomach cramps.  When his BMs come they come urgently. He has increased reglan and he feels it is not helping.  He has been having increasing issues with this.  Patient denies any blood.  He denies fevers or chills. He has crampy abdominal pain prior to bowel movement.  He used to have BMs every day and now it is only every few days. Nausea is better the day or 2 after BM and after passing gas.  He feels like after he eats he has upper stomach fullness.  The cramping is more lower down.   No diagnosis found.  Outpatient Encounter Medications as of 11/18/2018  Medication Sig  . acetaminophen (TYLENOL) 500 MG tablet Take 1,000 mg by mouth 2 (two) times daily.   Marland Kitchen acyclovir (ZOVIRAX) 400 MG tablet TAKE 1 TABLET (400 MG TOTAL) BY MOUTH 2 (TWO) TIMES DAILY.  . AgaMatrix Ultra-Thin Lancets MISC Test BS 4 times a day and prn Dx E10.65  . amLODipine (NORVASC) 10 MG tablet Take 1 tablet by mouth daily.  Marland Kitchen aspirin 81 MG chewable tablet Chew 81 mg by mouth every morning.  . cetirizine (ZYRTEC) 10 MG tablet TAKE 1 TABLET BY MOUTH EVERY DAY  . cyclobenzaprine (FLEXERIL) 10 MG tablet Take 1 tablet (10 mg total)  by mouth 3 (three) times daily as needed for muscle spasms.  . diclofenac sodium (VOLTAREN) 1 % GEL Apply 2 g topically 4 (four) times daily.  . DULoxetine (CYMBALTA) 60 MG capsule TAKE 1 CAPSULE (60 MG TOTAL) BY MOUTH 2 (TWO) TIMES DAILY. AS DIRECTED  . esomeprazole (NEXIUM) 40 MG capsule TAKE 1 CAPSULE BY MOUTH EVERY DAY  . febuxostat (ULORIC) 40 MG tablet TAKE 1 TABLET BY MOUTH EVERY DAY  . ferrous sulfate 325 (65 FE) MG tablet Take 650 mg by mouth daily with breakfast.   . fludrocortisone (FLORINEF) 0.1 MG tablet Take 0.1 mg by mouth daily.  . fluticasone (FLONASE) 50 MCG/ACT nasal spray SPRAY 2 SPRAYS INTO EACH NOSTRIL EVERY DAY  . furosemide (LASIX) 40 MG tablet Take 20 mg by mouth 2 (two) times daily.  . Glucosamine-Chondroit-Vit C-Mn (GLUCOSAMINE 1500 COMPLEX PO) Take 1 tablet by mouth 2 (two) times daily.   Marland Kitchen glucose blood (ONETOUCH VERIO) test strip TEST BLOOD SUGAR 4 TIMES DAILY AND AS NEEDED  . insulin lispro (HUMALOG) 100 UNIT/ML injection USE 42 UNITS TO 120 UNITS PER PUMP DAILY AS DIRECTED  . ipratropium (ATROVENT) 0.03 % nasal spray USE 2 SPRAY IN EACH NOSTRIL 2-3 TIMES DAILY  . levothyroxine (SYNTHROID) 200 MCG tablet Take 1 tablet (200 mcg total) by mouth daily before breakfast.  . LORazepam (ATIVAN) 0.5 MG tablet TAKE 1 TABLET (0.5 MG TOTAL) BY MOUTH DAILY AS  NEEDED.  Marland Kitchen losartan (COZAAR) 100 MG tablet Take 100 mg by mouth daily.  . meclizine (ANTIVERT) 12.5 MG tablet Take 1 tablet (12.5 mg total) by mouth 3 (three) times daily as needed for dizziness.  . metoCLOPramide (REGLAN) 5 MG tablet TAKE 1 TABLET BY MOUTH 3 TIMES A DAY BEFORE MEALS  . mupirocin cream (BACTROBAN) 2 % Apply 1 application topically 2 (two) times daily.  . niacin (NIASPAN) 1000 MG CR tablet TAKE 1 TABLET (1,000 MG TOTAL) BY MOUTH AT BEDTIME. (Patient taking differently: TAKE 1 TABLET (1,500 MG TOTAL) BY MOUTH AT BEDTIME.)  . ondansetron (ZOFRAN) 4 MG tablet Take 1 tablet (4 mg total) by mouth every 8 (eight)  hours as needed for nausea.  Marland Kitchen OVER THE COUNTER MEDICATION Take 1 capsule by mouth daily. [name]- probiotic daily  . simvastatin (ZOCOR) 40 MG tablet TAKE 1 TABLET BY MOUTH EVERYDAY AT BEDTIME  . Testosterone 20.25 MG/ACT (1.62%) GEL APPLY 3 PUMPS DAILY AS DIRECTED  . traMADol (ULTRAM) 50 MG tablet Take 1 tablet (50 mg total) by mouth every 12 (twelve) hours as needed. for pain  . TRIUMEQ 600-50-300 MG tablet TAKE 1 TABLET BY MOUTH DAILY  . VASCEPA 1 g CAPS TAKE 2 CAPSULES (2 G TOTAL) BY MOUTH 2 (TWO) TIMES DAILY.   No facility-administered encounter medications on file as of 11/18/2018.     Review of Systems  Constitutional: Negative for chills and fever.  Respiratory: Negative for shortness of breath and wheezing.   Cardiovascular: Negative for chest pain and leg swelling.  Gastrointestinal: Positive for abdominal pain and nausea. Negative for constipation, diarrhea and vomiting.  Genitourinary: Negative for difficulty urinating, dysuria and flank pain.  Musculoskeletal: Negative for back pain and gait problem.  Skin: Negative for rash.  Neurological: Negative for dizziness, weakness and numbness.  All other systems reviewed and are negative.   Observations/Objective: Patient sounds comfortable and in no acute distress  Assessment and Plan: Problem List Items Addressed This Visit    None    Visit Diagnoses    Constipation due to outlet dysfunction    -  Primary   Relevant Medications   polyethylene glycol powder (GLYCOLAX/MIRALAX) 17 GM/SCOOP powder       Follow Up Instructions:  follow up in 4 or 5 days as needed    I discussed the assessment and treatment plan with the patient. The patient was provided an opportunity to ask questions and all were answered. The patient agreed with the plan and demonstrated an understanding of the instructions.   The patient was advised to call back or seek an in-person evaluation if the symptoms worsen or if the condition fails to  improve as anticipated.  The above assessment and management plan was discussed with the patient. The patient verbalized understanding of and has agreed to the management plan. Patient is aware to call the clinic if symptoms persist or worsen. Patient is aware when to return to the clinic for a follow-up visit. Patient educated on when it is appropriate to go to the emergency department.    I provided 16 minutes of non-face-to-face time during this encounter.    Worthy Rancher, MD

## 2018-11-24 ENCOUNTER — Other Ambulatory Visit (HOSPITAL_COMMUNITY)
Admission: RE | Admit: 2018-11-24 | Discharge: 2018-11-24 | Disposition: A | Discharge status: Home / Self Care | Payer: Medicare Other | Source: Ambulatory Visit | Attending: Internal Medicine | Admitting: Internal Medicine

## 2018-11-24 ENCOUNTER — Other Ambulatory Visit: Payer: Self-pay

## 2018-11-24 ENCOUNTER — Ambulatory Visit: Payer: Medicare Other | Admitting: Internal Medicine

## 2018-11-24 ENCOUNTER — Encounter: Payer: Self-pay | Admitting: Internal Medicine

## 2018-11-24 VITALS — BP 145/83 | HR 96 | Temp 97.7°F | Wt 201.0 lb

## 2018-11-24 DIAGNOSIS — Z113 Encounter for screening for infections with a predominantly sexual mode of transmission: Secondary | ICD-10-CM

## 2018-11-24 DIAGNOSIS — B20 Human immunodeficiency virus [HIV] disease: Secondary | ICD-10-CM | POA: Insufficient documentation

## 2018-11-24 DIAGNOSIS — Z23 Encounter for immunization: Secondary | ICD-10-CM

## 2018-11-24 MED ORDER — TRIUMEQ 600-50-300 MG PO TABS: 1.0000 | ORAL_TABLET | Freq: Every day | ORAL | 11 refills | Status: DC

## 2018-11-24 NOTE — Assessment & Plan Note (Signed)
descreased GFR.  No dose adjustment of his ARVs indicated at this time.

## 2018-11-24 NOTE — Assessment & Plan Note (Signed)
Discussed pneumovax and given today

## 2018-11-24 NOTE — Progress Notes (Signed)
   Subjective:    Patient ID: Patrick Brown, male    DOB: 09-05-1970, 48 y.o.   MRN: [id number]  HPI Here for follow up of HIV He has been on Triumeq and no missed doses.  His biggest issues are related to his DM and renal disease.  He has had significant fatigue and is getting procrit from his nephrologist.  He is here today with his mother.  Asking if the HIV can be part of the issue with his renal function decline.  Teaching EMT and paramedic students.  No associated rash.    Review of Systems  Constitutional: Positive for fatigue.  Gastrointestinal: Negative for diarrhea and nausea.  Skin: Negative for rash.       Objective:   Physical Exam Constitutional:      Appearance: Normal appearance.  Eyes:     General: No scleral icterus. Cardiovascular:     Rate and Rhythm: Normal rate and regular rhythm.     Heart sounds: No murmur.  Pulmonary:     Effort: Pulmonary effort is normal.     Breath sounds: Normal breath sounds.  Skin:    Findings: No rash.  Neurological:     Mental Status: He is alert.  Psychiatric:        Mood and Affect: Mood normal.   SH: no tobacco        Assessment & Plan:

## 2018-11-24 NOTE — Assessment & Plan Note (Signed)
Will screen today 

## 2018-11-24 NOTE — Assessment & Plan Note (Signed)
He is doing well on his regimen and will continue.  Labs today and rtc 1 year. Refills sent

## 2018-11-25 DIAGNOSIS — N185 Chronic kidney disease, stage 5: Secondary | ICD-10-CM | POA: Diagnosis not present

## 2018-11-25 DIAGNOSIS — D631 Anemia in chronic kidney disease: Secondary | ICD-10-CM | POA: Diagnosis not present

## 2018-11-25 LAB — T-HELPER CELL (CD4) - (RCID CLINIC ONLY)
CD4 % Helper T Cell: 16 % — ABNORMAL LOW (ref 33–65)
CD4 T Cell Abs: 293 /uL — ABNORMAL LOW (ref 400–1790)

## 2018-11-26 LAB — URINE CYTOLOGY ANCILLARY ONLY
Chlamydia: NEGATIVE
Comment: NEGATIVE
Comment: NORMAL
Neisseria Gonorrhea: NEGATIVE

## 2018-11-27 LAB — FLUORESCENT TREPONEMAL AB(FTA)-IGG-BLD: Fluorescent Treponemal ABS: REACTIVE — AB

## 2018-11-27 LAB — RPR TITER: RPR Titer: 1:1 {titer} — ABNORMAL HIGH

## 2018-11-27 LAB — RPR: RPR Ser Ql: REACTIVE — AB

## 2018-11-27 LAB — HIV-1 RNA QUANT-NO REFLEX-BLD
HIV 1 RNA Quant: <20 copies/mL
HIV-1 RNA Quant, Log: <1.3 Log copies/mL

## 2018-11-28 ENCOUNTER — Other Ambulatory Visit: Payer: Self-pay | Admitting: Family Medicine

## 2018-12-09 DIAGNOSIS — N185 Chronic kidney disease, stage 5: Secondary | ICD-10-CM | POA: Diagnosis not present

## 2018-12-09 DIAGNOSIS — D631 Anemia in chronic kidney disease: Secondary | ICD-10-CM | POA: Diagnosis not present

## 2018-12-15 ENCOUNTER — Other Ambulatory Visit: Payer: Self-pay | Admitting: Family Medicine

## 2018-12-18 ENCOUNTER — Other Ambulatory Visit: Payer: Self-pay | Admitting: Family Medicine

## 2018-12-18 ENCOUNTER — Other Ambulatory Visit: Payer: Medicare Other

## 2018-12-18 ENCOUNTER — Other Ambulatory Visit: Payer: Self-pay

## 2018-12-18 DIAGNOSIS — E039 Hypothyroidism, unspecified: Secondary | ICD-10-CM

## 2018-12-18 DIAGNOSIS — N1831 Chronic kidney disease, stage 3a: Secondary | ICD-10-CM | POA: Diagnosis not present

## 2018-12-18 NOTE — Progress Notes (Signed)
Placed lab orders for patient 

## 2018-12-19 ENCOUNTER — Encounter: Payer: Self-pay | Admitting: Family Medicine

## 2018-12-19 LAB — CMP14+EGFR
ALT: 13 IU/L (ref 0–44)
AST: 18 IU/L (ref 0–40)
Albumin/Globulin Ratio: 1.3 (ref 1.2–2.2)
Albumin: 3.3 g/dL — ABNORMAL LOW (ref 4.0–5.0)
Alkaline Phosphatase: 121 IU/L — ABNORMAL HIGH (ref 39–117)
BUN/Creatinine Ratio: 10 (ref 9–20)
BUN: 38 mg/dL — ABNORMAL HIGH (ref 6–24)
Bilirubin Total: 0.2 mg/dL (ref 0.0–1.2)
CO2: 22 mmol/L (ref 20–29)
Calcium: 8.9 mg/dL (ref 8.7–10.2)
Chloride: 103 mmol/L (ref 96–106)
Creatinine, Ser: 3.72 mg/dL (ref 0.76–1.27)
GFR calc Af Amer: 21 mL/min/{1.73_m2} — ABNORMAL LOW (ref >59)
GFR calc non Af Amer: 18 mL/min/{1.73_m2} — ABNORMAL LOW (ref >59)
Globulin, Total: 2.5 g/dL (ref 1.5–4.5)
Glucose: 119 mg/dL — ABNORMAL HIGH (ref 65–99)
Potassium: 3 mmol/L — ABNORMAL LOW (ref 3.5–5.2)
Sodium: 139 mmol/L (ref 134–144)
Total Protein: 5.8 g/dL — ABNORMAL LOW (ref 6.0–8.5)

## 2018-12-19 LAB — CBC WITH DIFFERENTIAL/PLATELET
Basophils Absolute: 0.1 10*3/uL (ref 0.0–0.2)
Basos: 1 %
EOS (ABSOLUTE): 0.3 10*3/uL (ref 0.0–0.4)
Eos: 4 %
Hematocrit: 33.9 % — ABNORMAL LOW (ref 37.5–51.0)
Hemoglobin: 11.4 g/dL — ABNORMAL LOW (ref 13.0–17.7)
Immature Grans (Abs): 0 10*3/uL (ref 0.0–0.1)
Immature Granulocytes: 1 %
Lymphocytes Absolute: 1.8 10*3/uL (ref 0.7–3.1)
Lymphs: 30 %
MCH: 34.3 pg — ABNORMAL HIGH (ref 26.6–33.0)
MCHC: 33.6 g/dL (ref 31.5–35.7)
MCV: 102 fL — ABNORMAL HIGH (ref 79–97)
Monocytes Absolute: 0.6 10*3/uL (ref 0.1–0.9)
Monocytes: 10 %
Neutrophils Absolute: 3.3 10*3/uL (ref 1.4–7.0)
Neutrophils: 54 %
Platelets: 282 10*3/uL (ref 150–450)
RBC: 3.32 x10E6/uL — ABNORMAL LOW (ref 4.14–5.80)
RDW: 13.4 % (ref 11.6–15.4)
WBC: 6.1 10*3/uL (ref 3.4–10.8)

## 2018-12-19 LAB — TSH: TSH: 0.01 u[IU]/mL — ABNORMAL LOW (ref 0.450–4.500)

## 2018-12-19 NOTE — Progress Notes (Addendum)
Call received from [employer] with critical creatinine results of 3.72. Attempted to call pt at all contact numbers listed in chart, unable to reach pt. Upon chart review it was noted at last nephrology vist that creatinine was higher than above result. Results listed below. Will forward to PCP.   LABS: Lab Results  Component Value Date  WBC 12.2 (H) 10/28/2018  Hemoglobin 9.1 (L) 10/28/2018  Hematocrit 26.0 (L) 10/28/2018  Platelet Count 292 10/28/2018  ALT (SGPT) 31 10/28/2018  AST 29 10/28/2018  Sodium 136 10/28/2018  Potassium 3.7 10/28/2018  Chloride 103 10/28/2018  Creatinine, Serum 4.30 (H) 10/28/2018  BUN 39 (H) 10/28/2018  CO2 23 10/28/2018  TSH 2.370 05/30/2014  Glucose 203 (H) 10/28/2018  Hemoglobin A1c 7.2 (A) 08/29/2014  Albumin, Urine 423.2 07/30/2018     Monia Pouch, FNP-C [facility] [address] [place], [place] [address] [phone]

## 2018-12-21 ENCOUNTER — Other Ambulatory Visit: Payer: Self-pay | Admitting: *Deleted

## 2018-12-21 MED ORDER — LEVOTHYROXINE SODIUM 175 MCG PO TABS: 175.0000 ug | ORAL_TABLET | Freq: Every day | ORAL | 1 refills | Status: DC

## 2018-12-24 ENCOUNTER — Other Ambulatory Visit: Payer: Self-pay

## 2018-12-24 ENCOUNTER — Encounter: Payer: Self-pay | Admitting: Family Medicine

## 2018-12-24 ENCOUNTER — Ambulatory Visit (INDEPENDENT_AMBULATORY_CARE_PROVIDER_SITE_OTHER): Payer: Medicare Other | Admitting: Family Medicine

## 2018-12-24 DIAGNOSIS — R140 Abdominal distension (gaseous): Secondary | ICD-10-CM | POA: Diagnosis not present

## 2018-12-24 DIAGNOSIS — R1084 Generalized abdominal pain: Secondary | ICD-10-CM

## 2018-12-24 DIAGNOSIS — E1022 Type 1 diabetes mellitus with diabetic chronic kidney disease: Secondary | ICD-10-CM

## 2018-12-24 DIAGNOSIS — E1021 Type 1 diabetes mellitus with diabetic nephropathy: Secondary | ICD-10-CM | POA: Diagnosis not present

## 2018-12-24 DIAGNOSIS — E039 Hypothyroidism, unspecified: Secondary | ICD-10-CM | POA: Diagnosis not present

## 2018-12-24 DIAGNOSIS — N1832 Chronic kidney disease, stage 3b: Secondary | ICD-10-CM | POA: Diagnosis not present

## 2018-12-24 DIAGNOSIS — K219 Gastro-esophageal reflux disease without esophagitis: Secondary | ICD-10-CM | POA: Diagnosis not present

## 2018-12-24 DIAGNOSIS — D509 Iron deficiency anemia, unspecified: Secondary | ICD-10-CM | POA: Diagnosis not present

## 2018-12-24 DIAGNOSIS — K5902 Outlet dysfunction constipation: Secondary | ICD-10-CM

## 2018-12-24 DIAGNOSIS — K5904 Chronic idiopathic constipation: Secondary | ICD-10-CM | POA: Diagnosis not present

## 2018-12-24 DIAGNOSIS — K3184 Gastroparesis: Secondary | ICD-10-CM | POA: Diagnosis not present

## 2018-12-24 NOTE — Progress Notes (Signed)
Virtual Visit via telephone Note  I connected with Patrick Brown on 12/24/18 at 0943 by telephone and verified that I am speaking with the correct person using two identifiers. Patrick Brown is currently located at home and no other people are currently with her during visit. The provider, Fransisca Kaufmann Americus Perkey, MD is located in their office at time of visit.  Call ended at 0957  I discussed the limitations, risks, security and privacy concerns of performing an evaluation and management service by telephone and the availability of in person appointments. I also discussed with the patient that there may be a patient responsible charge related to this service. The patient expressed understanding and agreed to proceed.   History and Present Illness: Hypothyroidism recheck Patient is coming in for thyroid recheck today as well. They deny any issues with hair changes or heat or cold problems or diarrhea or constipation. They deny any chest pain or palpitations. They are currently on levothyroxine 195micrograms   ckd Patient already spoke with nephrology about kidney function and potassium and said to increase diet.    Type 1 diabetes mellitus Patient has endocrinology Patient comes in today for recheck of his diabetes. Patient has been currently taking insulin pump. Patient is currently on an ACE inhibitor/ARB. Patient has seen an ophthalmologist this year. Patient denies any issues with their feet.   No diagnosis found.  Outpatient Encounter Medications as of 12/24/2018  Medication Sig  . abacavir-dolutegravir-lamiVUDine (TRIUMEQ) 600-50-300 MG tablet Take 1 tablet by mouth daily.  Marland Kitchen acetaminophen (TYLENOL) 500 MG tablet Take 1,000 mg by mouth 2 (two) times daily.   Marland Kitchen acyclovir (ZOVIRAX) 400 MG tablet TAKE 1 TABLET (400 MG TOTAL) BY MOUTH 2 (TWO) TIMES DAILY.  . AgaMatrix Ultra-Thin Lancets MISC Test BS 4 times a day and prn Dx E10.65  . amLODipine (NORVASC) 10 MG tablet Take 1 tablet by  mouth daily.  Marland Kitchen aspirin 81 MG chewable tablet Chew 81 mg by mouth every morning.  . cetirizine (ZYRTEC) 10 MG tablet TAKE 1 TABLET BY MOUTH EVERY DAY  . cyclobenzaprine (FLEXERIL) 10 MG tablet Take 1 tablet (10 mg total) by mouth 3 (three) times daily as needed for muscle spasms.  . diclofenac sodium (VOLTAREN) 1 % GEL Apply 2 g topically 4 (four) times daily.  . DULoxetine (CYMBALTA) 60 MG capsule TAKE 1 CAPSULE (60 MG TOTAL) BY MOUTH 2 (TWO) TIMES DAILY. AS DIRECTED  . esomeprazole (NEXIUM) 40 MG capsule TAKE 1 CAPSULE BY MOUTH EVERY DAY  . febuxostat (ULORIC) 40 MG tablet TAKE 1 TABLET BY MOUTH EVERY DAY  . ferrous sulfate 325 (65 FE) MG tablet Take 650 mg by mouth daily with breakfast.   . fludrocortisone (FLORINEF) 0.1 MG tablet Take 0.1 mg by mouth daily.  . fluticasone (FLONASE) 50 MCG/ACT nasal spray SPRAY 2 SPRAYS INTO EACH NOSTRIL EVERY DAY  . furosemide (LASIX) 40 MG tablet Take 20 mg by mouth 2 (two) times daily.  . Glucosamine-Chondroit-Vit C-Mn (GLUCOSAMINE 1500 COMPLEX PO) Take 1 tablet by mouth 2 (two) times daily.   Marland Kitchen glucose blood (ONETOUCH VERIO) test strip TEST BLOOD SUGAR 4 TIMES DAILY AND AS NEEDED  . insulin lispro (HUMALOG) 100 UNIT/ML injection USE 42 UNITS TO 120 UNITS PER PUMP DAILY AS DIRECTED  . ipratropium (ATROVENT) 0.03 % nasal spray USE 2 SPRAY IN EACH NOSTRIL 2-3 TIMES DAILY  . levothyroxine (SYNTHROID) 175 MCG tablet Take 1 tablet (175 mcg total) by mouth daily before breakfast.  . LORazepam (ATIVAN) 0.5  MG tablet TAKE 1 TABLET (0.5 MG TOTAL) BY MOUTH DAILY AS NEEDED.  Marland Kitchen losartan (COZAAR) 100 MG tablet Take 100 mg by mouth daily.  . meclizine (ANTIVERT) 12.5 MG tablet Take 1 tablet (12.5 mg total) by mouth 3 (three) times daily as needed for dizziness.  . metoCLOPramide (REGLAN) 5 MG tablet TAKE 1 TABLET BY MOUTH 3 TIMES A DAY BEFORE MEALS  . mupirocin cream (BACTROBAN) 2 % Apply 1 application topically 2 (two) times daily. (Patient not taking: Reported on  11/24/2018)  . niacin (NIASPAN) 1000 MG CR tablet TAKE 1 TABLET (1,000 MG TOTAL) BY MOUTH AT BEDTIME. (Patient taking differently: TAKE 1 TABLET (1,500 MG TOTAL) BY MOUTH AT BEDTIME.)  . ondansetron (ZOFRAN) 4 MG tablet Take 1 tablet (4 mg total) by mouth every 8 (eight) hours as needed for nausea.  Marland Kitchen OVER THE COUNTER MEDICATION Take 1 capsule by mouth daily. [name]- probiotic daily  . polyethylene glycol powder (GLYCOLAX/MIRALAX) 17 GM/SCOOP powder Take 17 g by mouth daily as needed.  . simvastatin (ZOCOR) 40 MG tablet TAKE 1 TABLET BY MOUTH EVERYDAY AT BEDTIME  . Testosterone 20.25 MG/ACT (1.62%) GEL APPLY 3 PUMPS DAILY AS DIRECTED  . traMADol (ULTRAM) 50 MG tablet Take 1 tablet (50 mg total) by mouth every 12 (twelve) hours as needed. for pain  . VASCEPA 1 g CAPS TAKE 2 CAPSULES (2 G TOTAL) BY MOUTH 2 (TWO) TIMES DAILY.   No facility-administered encounter medications on file as of 12/24/2018.     Review of Systems  Constitutional: Negative for chills and fever.  Eyes: Negative for visual disturbance.  Respiratory: Negative for shortness of breath and wheezing.   Cardiovascular: Negative for chest pain and leg swelling.  Musculoskeletal: Negative for back pain and gait problem.  Skin: Negative for rash.  Neurological: Negative for dizziness, weakness and light-headedness.  All other systems reviewed and are negative.   Observations/Objective: Patient sounds comfortable and in no acute distress   Assessment and Plan: Problem List Items Addressed This Visit      Endocrine   Type 1 diabetes mellitus ([place])   Acquired hypothyroidism - Primary     Genitourinary   Chronic kidney disease, stage 3, mod decreased GFR       Follow Up Instructions: Follow up in 6 months for thyroid     I discussed the assessment and treatment plan with the patient. The patient was provided an opportunity to ask questions and all were answered. The patient agreed with the plan and demonstrated an  understanding of the instructions.   The patient was advised to call back or seek an in-person evaluation if the symptoms worsen or if the condition fails to improve as anticipated.  The above assessment and management plan was discussed with the patient. The patient verbalized understanding of and has agreed to the management plan. Patient is aware to call the clinic if symptoms persist or worsen. Patient is aware when to return to the clinic for a follow-up visit. Patient educated on when it is appropriate to go to the emergency department.    I provided 14 minutes of non-face-to-face time during this encounter.    Worthy Rancher, MD

## 2018-12-29 ENCOUNTER — Other Ambulatory Visit: Payer: Self-pay | Admitting: Family Medicine

## 2018-12-30 ENCOUNTER — Encounter: Payer: Self-pay | Admitting: Family Medicine

## 2018-12-30 MED ORDER — HYOSCYAMINE SULFATE 0.125 MG PO TABS: 0.1250 mg | ORAL_TABLET | ORAL | 1 refills | Status: DC | PRN

## 2019-01-07 ENCOUNTER — Telehealth: Payer: Self-pay | Admitting: Gastroenterology

## 2019-01-07 ENCOUNTER — Ambulatory Visit (INDEPENDENT_AMBULATORY_CARE_PROVIDER_SITE_OTHER): Payer: Medicare Other | Admitting: Family Medicine

## 2019-01-07 ENCOUNTER — Other Ambulatory Visit: Payer: Self-pay

## 2019-01-07 ENCOUNTER — Encounter: Payer: Self-pay | Admitting: Family Medicine

## 2019-01-07 VITALS — BP 118/77 | HR 97 | Temp 96.6°F | Ht 70.0 in | Wt 191.0 lb

## 2019-01-07 DIAGNOSIS — E1022 Type 1 diabetes mellitus with diabetic chronic kidney disease: Secondary | ICD-10-CM

## 2019-01-07 DIAGNOSIS — E1021 Type 1 diabetes mellitus with diabetic nephropathy: Secondary | ICD-10-CM

## 2019-01-07 DIAGNOSIS — N1832 Chronic kidney disease, stage 3b: Secondary | ICD-10-CM

## 2019-01-07 DIAGNOSIS — E039 Hypothyroidism, unspecified: Secondary | ICD-10-CM | POA: Diagnosis not present

## 2019-01-07 LAB — CMP14+EGFR
ALT: 12 IU/L (ref 0–44)
AST: 14 IU/L (ref 0–40)
Albumin/Globulin Ratio: 1.3 (ref 1.2–2.2)
Albumin: 3.1 g/dL — ABNORMAL LOW (ref 4.0–5.0)
Alkaline Phosphatase: 141 IU/L — ABNORMAL HIGH (ref 39–117)
BUN/Creatinine Ratio: 7 — ABNORMAL LOW (ref 9–20)
BUN: 29 mg/dL — ABNORMAL HIGH (ref 6–24)
Bilirubin Total: 0.2 mg/dL (ref 0.0–1.2)
CO2: 22 mmol/L (ref 20–29)
Calcium: 8.7 mg/dL (ref 8.7–10.2)
Chloride: 101 mmol/L (ref 96–106)
Creatinine, Ser: 3.97 mg/dL (ref 0.76–1.27)
GFR calc Af Amer: 19 mL/min/{1.73_m2} — ABNORMAL LOW (ref >59)
GFR calc non Af Amer: 16 mL/min/{1.73_m2} — ABNORMAL LOW (ref >59)
Globulin, Total: 2.4 g/dL (ref 1.5–4.5)
Glucose: 165 mg/dL — ABNORMAL HIGH (ref 65–99)
Potassium: 2.9 mmol/L — ABNORMAL LOW (ref 3.5–5.2)
Sodium: 139 mmol/L (ref 134–144)
Total Protein: 5.5 g/dL — ABNORMAL LOW (ref 6.0–8.5)

## 2019-01-07 LAB — BAYER DCA HB A1C WAIVED: HB A1C (BAYER DCA - WAIVED): 5.8 % (ref <7.0)

## 2019-01-07 LAB — CBC WITH DIFFERENTIAL/PLATELET
Basophils Absolute: 0.1 10*3/uL (ref 0.0–0.2)
Basos: 1 %
EOS (ABSOLUTE): 0.3 10*3/uL (ref 0.0–0.4)
Eos: 3 %
Hematocrit: 32.5 % — ABNORMAL LOW (ref 37.5–51.0)
Hemoglobin: 11.3 g/dL — ABNORMAL LOW (ref 13.0–17.7)
Immature Grans (Abs): 0.1 10*3/uL (ref 0.0–0.1)
Immature Granulocytes: 1 %
Lymphocytes Absolute: 2.3 10*3/uL (ref 0.7–3.1)
Lymphs: 26 %
MCH: 34.2 pg — ABNORMAL HIGH (ref 26.6–33.0)
MCHC: 34.8 g/dL (ref 31.5–35.7)
MCV: 99 fL — ABNORMAL HIGH (ref 79–97)
Monocytes Absolute: 0.8 10*3/uL (ref 0.1–0.9)
Monocytes: 9 %
Neutrophils Absolute: 5.6 10*3/uL (ref 1.4–7.0)
Neutrophils: 60 %
Platelets: 347 10*3/uL (ref 150–450)
RBC: 3.3 x10E6/uL — ABNORMAL LOW (ref 4.14–5.80)
RDW: 13.3 % (ref 11.6–15.4)
WBC: 9.2 10*3/uL (ref 3.4–10.8)

## 2019-01-07 MED ORDER — CYCLOBENZAPRINE HCL 10 MG PO TABS: 10.0000 mg | ORAL_TABLET | Freq: Three times a day (TID) | ORAL | 3 refills | Status: DC | PRN

## 2019-01-07 NOTE — Telephone Encounter (Signed)
DOD 01/07/19  Dr. Silverio Decamp, there is a referral to evaluate pt for gastroparesis.  Pt saw Dr. Collene Mares 12/24/18 and stated that he is "not satisfied with their office" and requested to transfer care.  Records will be sent to you for review.  Will you accept this patient?

## 2019-01-07 NOTE — Progress Notes (Signed)
BP 118/77   Pulse 97   Temp (!) 96.6 F (35.9 C) (Temporal)   Ht _0  (1.778 m)   Wt 191 lb (86.6 kg)   SpO2 100%   BMI 27.41 kg/m    Subjective:   Patient ID: Patrick Brown, male    DOB: [birth year], 48 y.o.   MRN: [id number]  HPI: Patrick Brown is a 48 y.o. male presenting on 01/07/2019 for No chief complaint on file.   HPI Type 1 diabetes mellitus Patient comes in today for recheck of his diabetes. Patient has been currently taking [place] insulin pump, sees endocrinology. Patient is currently on an ACE inhibitor/ARB. Patient has seen an ophthalmologist this year. Patient denies any issues with their feet.  Patient does have known CKD.  Hypothyroidism recheck Patient is coming in for thyroid recheck today as well. They deny any issues with hair changes or heat or cold problems or diarrhea or constipation. They deny any chest pain or palpitations. They are currently on levothyroxine 114mcrograms   Relevant past medical, surgical, family and social history reviewed and updated as indicated. Interim medical history since our last visit reviewed. Allergies and medications reviewed and updated.  Review of Systems  Constitutional: Negative for chills and fever.  Respiratory: Negative for shortness of breath and wheezing.   Cardiovascular: Negative for chest pain and leg swelling.  Musculoskeletal: Negative for back pain and gait problem.  Skin: Negative for rash.  Neurological: Negative for dizziness, weakness and numbness.  All other systems reviewed and are negative.   Per HPI unless specifically indicated above   Allergies as of 01/07/2019      Reactions   Sulfa Antibiotics Other (See Comments)   High potassium   Ramipril Cough   Versed [midazolam] Other (See Comments)   "I don't wake up very good or clear it out of my system"      Medication List       Accurate as of January 07, 2019  9:04 AM. If you have any questions, ask your nurse or doctor.         acetaminophen 500 MG tablet Commonly known as: TYLENOL Take 1,000 mg by mouth 2 (two) times daily.   acyclovir 400 MG tablet Commonly known as: ZOVIRAX TAKE 1 TABLET (400 MG TOTAL) BY MOUTH 2 (TWO) TIMES DAILY.   AgaMatrix Ultra-Thin Lancets Misc Test BS 4 times a day and prn Dx E10.65   amLODipine 10 MG tablet Commonly known as: NORVASC Take 1 tablet by mouth daily.   aspirin 81 MG chewable tablet Chew 81 mg by mouth every morning.   cetirizine 10 MG tablet Commonly known as: ZYRTEC TAKE 1 TABLET BY MOUTH EVERY DAY   cyclobenzaprine 10 MG tablet Commonly known as: FLEXERIL Take 1 tablet (10 mg total) by mouth 3 (three) times daily as needed for muscle spasms.   diclofenac sodium 1 % Gel Commonly known as: VOLTAREN Apply 2 g topically 4 (four) times daily.   DULoxetine 60 MG capsule Commonly known as: CYMBALTA TAKE 1 CAPSULE (60 MG TOTAL) BY MOUTH 2 (TWO) TIMES DAILY. AS DIRECTED   esomeprazole 40 MG capsule Commonly known as: NEXIUM TAKE 1 CAPSULE BY MOUTH EVERY DAY   febuxostat 40 MG tablet Commonly known as: ULORIC TAKE 1 TABLET BY MOUTH EVERY DAY   ferrous sulfate 325 (65 FE) MG tablet Take 650 mg by mouth daily with breakfast.   fludrocortisone 0.1 MG tablet Commonly known as: FLORINEF Take 0.1 mg by mouth daily.  fluticasone 50 MCG/ACT nasal spray Commonly known as: FLONASE SPRAY 2 SPRAYS INTO EACH NOSTRIL EVERY DAY   furosemide 40 MG tablet Commonly known as: LASIX Take 20 mg by mouth 2 (two) times daily.   GLUCOSAMINE 1500 COMPLEX PO Take 1 tablet by mouth 2 (two) times daily.   glucose blood test strip Commonly known as: OneTouch Verio TEST BLOOD SUGAR 4 TIMES DAILY AND AS NEEDED   hyoscyamine 0.125 MG tablet Commonly known as: LEVSIN Take 1 tablet (0.125 mg total) by mouth every 4 (four) hours as needed.   insulin lispro 100 UNIT/ML injection Commonly known as: HumaLOG USE 42 UNITS TO 120 UNITS PER PUMP DAILY AS DIRECTED    ipratropium 0.03 % nasal spray Commonly known as: ATROVENT USE 2 SPRAY IN EACH NOSTRIL 2-3 TIMES DAILY   levothyroxine 175 MCG tablet Commonly known as: SYNTHROID Take 1 tablet (175 mcg total) by mouth daily before breakfast.   LORazepam 0.5 MG tablet Commonly known as: ATIVAN TAKE 1 TABLET (0.5 MG TOTAL) BY MOUTH DAILY AS NEEDED.   losartan 100 MG tablet Commonly known as: COZAAR Take 100 mg by mouth daily.   meclizine 12.5 MG tablet Commonly known as: ANTIVERT Take 1 tablet (12.5 mg total) by mouth 3 (three) times daily as needed for dizziness.   metoCLOPramide 5 MG tablet Commonly known as: REGLAN TAKE 1 TABLET BY MOUTH 3 TIMES A DAY BEFORE MEALS   mupirocin cream 2 % Commonly known as: Bactroban Apply 1 application topically 2 (two) times daily.   niacin 1000 MG CR tablet Commonly known as: NIASPAN TAKE 1 TABLET (1,000 MG TOTAL) BY MOUTH AT BEDTIME. What changed: See the new instructions.   ondansetron 4 MG tablet Commonly known as: ZOFRAN Take 1 tablet (4 mg total) by mouth every 8 (eight) hours as needed for nausea.   OVER THE COUNTER MEDICATION Take 1 capsule by mouth daily. [name]- probiotic daily   polyethylene glycol powder 17 GM/SCOOP powder Commonly known as: GLYCOLAX/MIRALAX Take 17 g by mouth daily as needed.   simvastatin 40 MG tablet Commonly known as: ZOCOR TAKE 1 TABLET BY MOUTH EVERYDAY AT BEDTIME   Testosterone 20.25 MG/ACT (1.62%) Gel APPLY 3 PUMPS DAILY AS DIRECTED   traMADol 50 MG tablet Commonly known as: ULTRAM Take 1 tablet (50 mg total) by mouth every 12 (twelve) hours as needed. for pain   Triumeq 600-50-300 MG tablet Generic drug: abacavir-dolutegravir-lamiVUDine Take 1 tablet by mouth daily.   Vascepa 1 g capsule Generic drug: icosapent Ethyl TAKE 2 CAPSULES (2 G TOTAL) BY MOUTH 2 (TWO) TIMES DAILY.        Objective:   BP 118/77   Pulse 97   Temp (!) 96.6 F (35.9 C) (Temporal)   Ht _0  (1.778 m)   Wt 191 lb  (86.6 kg)   SpO2 100%   BMI 27.41 kg/m   Wt Readings from Last 3 Encounters:  01/07/19 191 lb (86.6 kg)  11/24/18 201 lb (91.2 kg)  10/07/18 210 lb (95.3 kg)    Physical Exam Vitals signs and nursing note reviewed.  Constitutional:      General: He is not in acute distress.    Appearance: He is well-developed. He is not diaphoretic.  Eyes:     General: No scleral icterus.    Conjunctiva/sclera: Conjunctivae normal.  Neck:     Musculoskeletal: Neck supple.     Thyroid: No thyromegaly.  Cardiovascular:     Rate and Rhythm: Normal rate and regular rhythm.  Heart sounds: Normal heart sounds. No murmur.  Pulmonary:     Effort: Pulmonary effort is normal. No respiratory distress.     Breath sounds: Normal breath sounds. No wheezing.  Musculoskeletal: Normal range of motion.  Lymphadenopathy:     Cervical: No cervical adenopathy.  Skin:    General: Skin is warm and dry.     Findings: No rash.  Neurological:     Mental Status: He is alert and oriented to person, place, and time.     Coordination: Coordination normal.  Psychiatric:        Behavior: Behavior normal.       Assessment & Plan:   Problem List Items Addressed This Visit      Endocrine   Type 1 diabetes mellitus ([place]) - Primary   Relevant Orders   hgba1c   Acquired hypothyroidism   Relevant Orders   TSH     Genitourinary   Chronic kidney disease, stage 3, mod decreased GFR   Relevant Orders   CBC with Differential/Platelet   CMP14+EGFR      Continue current medication, A1c is 5.8, sounds like he is having some hypoglycemic episodes and he will speak with his endocrinologist on Monday about adjusting his basal dose down to he does not have hypoglycemia.  No changes in medication Follow up plan: Return in about 6 months (around 07/08/2019), or if symptoms worsen or fail to improve, for Follow-up thyroid and CKD.  Counseling provided for all of the vaccine components Orders Placed This Encounter   Procedures  . hgba1c  . CBC with Differential/Platelet  . CMP14+EGFR  . TSH    Caryl Pina, MD [facility] 01/07/2019, 9:04 AM

## 2019-01-09 LAB — TSH: TSH: 0.011 u[IU]/mL — ABNORMAL LOW (ref 0.450–4.500)

## 2019-01-09 LAB — SPECIMEN STATUS REPORT

## 2019-01-11 DIAGNOSIS — E10649 Type 1 diabetes mellitus with hypoglycemia without coma: Secondary | ICD-10-CM | POA: Diagnosis not present

## 2019-01-11 DIAGNOSIS — Z9641 Presence of insulin pump (external) (internal): Secondary | ICD-10-CM | POA: Diagnosis not present

## 2019-01-11 DIAGNOSIS — Z794 Long term (current) use of insulin: Secondary | ICD-10-CM | POA: Diagnosis not present

## 2019-01-11 DIAGNOSIS — E108 Type 1 diabetes mellitus with unspecified complications: Secondary | ICD-10-CM | POA: Diagnosis not present

## 2019-01-12 DIAGNOSIS — N185 Chronic kidney disease, stage 5: Secondary | ICD-10-CM | POA: Diagnosis not present

## 2019-01-14 ENCOUNTER — Ambulatory Visit (HOSPITAL_COMMUNITY): Payer: Medicare Other

## 2019-01-15 MED ORDER — LEVOTHYROXINE SODIUM 150 MCG PO TABS: 150.0000 ug | ORAL_TABLET | Freq: Every day | ORAL | 1 refills | Status: DC

## 2019-01-15 NOTE — Addendum Note (Signed)
Addended by: Karle Plumber on: 01/15/2019 04:45 PM   Modules accepted: Orders

## 2019-01-18 ENCOUNTER — Encounter: Payer: Self-pay | Admitting: Family Medicine

## 2019-01-18 DIAGNOSIS — K3184 Gastroparesis: Secondary | ICD-10-CM

## 2019-01-19 ENCOUNTER — Encounter: Payer: Self-pay | Admitting: Gastroenterology

## 2019-01-23 ENCOUNTER — Other Ambulatory Visit: Payer: Self-pay | Admitting: Family Medicine

## 2019-01-28 ENCOUNTER — Ambulatory Visit (HOSPITAL_COMMUNITY): Payer: Medicare Other

## 2019-01-28 DIAGNOSIS — G4733 Obstructive sleep apnea (adult) (pediatric): Secondary | ICD-10-CM | POA: Diagnosis not present

## 2019-02-08 DIAGNOSIS — N185 Chronic kidney disease, stage 5: Secondary | ICD-10-CM | POA: Diagnosis not present

## 2019-02-08 DIAGNOSIS — K529 Noninfective gastroenteritis and colitis, unspecified: Secondary | ICD-10-CM | POA: Diagnosis not present

## 2019-02-08 DIAGNOSIS — R112 Nausea with vomiting, unspecified: Secondary | ICD-10-CM | POA: Diagnosis not present

## 2019-02-11 ENCOUNTER — Ambulatory Visit (HOSPITAL_COMMUNITY): Admission: RE | Admit: 2019-02-11 | Payer: Medicare Other | Source: Ambulatory Visit

## 2019-02-11 ENCOUNTER — Other Ambulatory Visit: Payer: Self-pay | Admitting: Family Medicine

## 2019-02-12 ENCOUNTER — Encounter: Payer: Self-pay | Admitting: Family Medicine

## 2019-02-12 MED ORDER — PROMETHAZINE HCL 25 MG RE SUPP: 25.0000 mg | Freq: Four times a day (QID) | RECTAL | 0 refills | Status: DC | PRN

## 2019-02-12 NOTE — Telephone Encounter (Signed)
Please let the patient know that I sent some Phenergan suppositories for him that he can use as needed.

## 2019-02-18 ENCOUNTER — Ambulatory Visit (INDEPENDENT_AMBULATORY_CARE_PROVIDER_SITE_OTHER): Payer: Medicare Other | Admitting: Family Medicine

## 2019-02-18 ENCOUNTER — Encounter: Payer: Self-pay | Admitting: Family Medicine

## 2019-02-18 DIAGNOSIS — R634 Abnormal weight loss: Secondary | ICD-10-CM

## 2019-02-18 DIAGNOSIS — K529 Noninfective gastroenteritis and colitis, unspecified: Secondary | ICD-10-CM | POA: Diagnosis not present

## 2019-02-18 DIAGNOSIS — R630 Anorexia: Secondary | ICD-10-CM | POA: Diagnosis not present

## 2019-02-18 MED ORDER — MIRTAZAPINE 30 MG PO TABS: 30.0000 mg | ORAL_TABLET | Freq: Every day | ORAL | 3 refills | Status: DC

## 2019-02-18 NOTE — Progress Notes (Signed)
Virtual Visit via telephone Note  I connected with Patrick Brown on 02/18/19 at 1424 by telephone and verified that I am speaking with the correct person using two identifiers. Patrick Brown is currently located at home and no other people are currently with her during visit. The provider, Fransisca Kaufmann Nancee Brownrigg, MD is located in their office at time of visit.  Call ended at 1437  I discussed the limitations, risks, security and privacy concerns of performing an evaluation and management service by telephone and the availability of in person appointments. I also discussed with the patient that there may be a patient responsible charge related to this service. The patient expressed understanding and agreed to proceed.   History and Present Illness: Patient is having a lack of appetite and I losing weight steadily since about 2 months and the GI doctor and he left stool at their office today. Everything is good from HIV control. We did lower his thyroid dose and he has not seen any difference with weight.  His weight is down to 185 pounds. He is down 25 pounds. He gets cramps and pains.  They are testing for EPI and c diff and other stool issues.   1. Loss of appetite   2. Weight loss, abnormal     Outpatient Encounter Medications as of 02/18/2019  Medication Sig  . abacavir-dolutegravir-lamiVUDine (TRIUMEQ) 600-50-300 MG tablet Take 1 tablet by mouth daily.  Marland Kitchen acetaminophen (TYLENOL) 500 MG tablet Take 1,000 mg by mouth 2 (two) times daily.   Marland Kitchen acyclovir (ZOVIRAX) 400 MG tablet TAKE 1 TABLET (400 MG TOTAL) BY MOUTH 2 (TWO) TIMES DAILY.  . AgaMatrix Ultra-Thin Lancets MISC Test BS 4 times a day and prn Dx E10.65  . amLODipine (NORVASC) 10 MG tablet Take 1 tablet by mouth daily.  Marland Kitchen aspirin 81 MG chewable tablet Chew 81 mg by mouth every morning.  . cetirizine (ZYRTEC) 10 MG tablet TAKE 1 TABLET BY MOUTH EVERY DAY  . cyclobenzaprine (FLEXERIL) 10 MG tablet Take 1 tablet (10 mg total) by mouth 3  (three) times daily as needed for muscle spasms.  . diclofenac sodium (VOLTAREN) 1 % GEL Apply 2 g topically 4 (four) times daily.  . DULoxetine (CYMBALTA) 60 MG capsule TAKE 1 CAPSULE (60 MG TOTAL) BY MOUTH 2 (TWO) TIMES DAILY. AS DIRECTED  . esomeprazole (NEXIUM) 40 MG capsule TAKE 1 CAPSULE BY MOUTH EVERY DAY  . febuxostat (ULORIC) 40 MG tablet TAKE 1 TABLET BY MOUTH EVERY DAY  . ferrous sulfate 325 (65 FE) MG tablet Take 650 mg by mouth daily with breakfast.   . fludrocortisone (FLORINEF) 0.1 MG tablet Take 0.1 mg by mouth daily.  . fluticasone (FLONASE) 50 MCG/ACT nasal spray SPRAY 2 SPRAYS INTO EACH NOSTRIL EVERY DAY  . furosemide (LASIX) 40 MG tablet Take 20 mg by mouth 2 (two) times daily.  . Glucosamine-Chondroit-Vit C-Mn (GLUCOSAMINE 1500 COMPLEX PO) Take 1 tablet by mouth 2 (two) times daily.   Marland Kitchen glucose blood (ONETOUCH VERIO) test strip TEST BLOOD SUGAR 4 TIMES DAILY AND AS NEEDED  . hyoscyamine (LEVSIN) 0.125 MG tablet Take 1 tablet (0.125 mg total) by mouth every 4 (four) hours as needed.  . insulin lispro (HUMALOG) 100 UNIT/ML injection USE 42 UNITS TO 120 UNITS PER PUMP DAILY AS DIRECTED  . ipratropium (ATROVENT) 0.03 % nasal spray USE 2 SPRAY IN EACH NOSTRIL 2-3 TIMES DAILY  . levothyroxine (SYNTHROID) 150 MCG tablet Take 1 tablet (150 mcg total) by mouth daily.  Marland Kitchen  LORazepam (ATIVAN) 0.5 MG tablet TAKE 1 TABLET (0.5 MG TOTAL) BY MOUTH DAILY AS NEEDED.  Marland Kitchen losartan (COZAAR) 100 MG tablet Take 100 mg by mouth daily.  . meclizine (ANTIVERT) 12.5 MG tablet Take 1 tablet (12.5 mg total) by mouth 3 (three) times daily as needed for dizziness.  . metoCLOPramide (REGLAN) 5 MG tablet TAKE 1 TABLET BY MOUTH 3 TIMES A DAY BEFORE MEALS  . mirtazapine (REMERON) 30 MG tablet Take 1 tablet (30 mg total) by mouth at bedtime.  . mupirocin cream (BACTROBAN) 2 % Apply 1 application topically 2 (two) times daily. (Patient not taking: Reported on 11/24/2018)  . niacin (NIASPAN) 1000 MG CR tablet TAKE  1 TABLET (1,000 MG TOTAL) BY MOUTH AT BEDTIME. (Patient taking differently: TAKE 1 TABLET (1,500 MG TOTAL) BY MOUTH AT BEDTIME.)  . ondansetron (ZOFRAN) 4 MG tablet Take 1 tablet (4 mg total) by mouth every 8 (eight) hours as needed for nausea.  Marland Kitchen OVER THE COUNTER MEDICATION Take 1 capsule by mouth daily. [name]- probiotic daily  . polyethylene glycol powder (GLYCOLAX/MIRALAX) 17 GM/SCOOP powder Take 17 g by mouth daily as needed.  . promethazine (PHENERGAN) 25 MG suppository Place 1 suppository (25 mg total) rectally every 6 (six) hours as needed for nausea or vomiting.  . simvastatin (ZOCOR) 40 MG tablet TAKE 1 TABLET BY MOUTH EVERYDAY AT BEDTIME  . Testosterone 20.25 MG/ACT (1.62%) GEL APPLY 3 PUMPS DAILY AS DIRECTED  . traMADol (ULTRAM) 50 MG tablet Take 1 tablet (50 mg total) by mouth every 12 (twelve) hours as needed. for pain  . VASCEPA 1 g CAPS TAKE 2 CAPSULES (2 G TOTAL) BY MOUTH 2 (TWO) TIMES DAILY.   No facility-administered encounter medications on file as of 02/18/2019.    Review of Systems  Constitutional: Positive for appetite change and unexpected weight change. Negative for chills and fever.  Eyes: Negative for discharge.  Respiratory: Negative for shortness of breath and wheezing.   Cardiovascular: Negative for chest pain and leg swelling.  Gastrointestinal: Positive for nausea. Negative for abdominal distention, abdominal pain, constipation, diarrhea and vomiting.  Musculoskeletal: Negative for back pain and gait problem.  Skin: Negative for rash.  All other systems reviewed and are negative.   Observations/Objective: Patient sounds comfortable and in no acute distress  Assessment and Plan: Problem List Items Addressed This Visit    None    Visit Diagnoses    Loss of appetite    -  Primary   Relevant Medications   mirtazapine (REMERON) 30 MG tablet   Weight loss, abnormal       Relevant Medications   mirtazapine (REMERON) 30 MG tablet      Will start  Remeron to see if it helps him with his appetite stimulant, if not may consider Marinol in the future, he is following up with his gastroenterologist already and has testing in process with gastroenterology. Follow up plan: Return if symptoms worsen or fail to improve.     I discussed the assessment and treatment plan with the patient. The patient was provided an opportunity to ask questions and all were answered. The patient agreed with the plan and demonstrated an understanding of the instructions.   The patient was advised to call back or seek an in-person evaluation if the symptoms worsen or if the condition fails to improve as anticipated.  The above assessment and management plan was discussed with the patient. The patient verbalized understanding of and has agreed to the management plan. Patient is  aware to call the clinic if symptoms persist or worsen. Patient is aware when to return to the clinic for a follow-up visit. Patient educated on when it is appropriate to go to the emergency department.    I provided 13 minutes of non-face-to-face time during this encounter.    Worthy Rancher, MD

## 2019-02-22 ENCOUNTER — Encounter: Payer: Self-pay | Admitting: Family Medicine

## 2019-02-22 MED ORDER — DIPHENOXYLATE-ATROPINE 2.5-0.025 MG PO TABS: 1.0000 | ORAL_TABLET | Freq: Four times a day (QID) | ORAL | 2 refills | Status: DC | PRN

## 2019-02-23 ENCOUNTER — Other Ambulatory Visit: Payer: Self-pay

## 2019-02-23 ENCOUNTER — Ambulatory Visit: Payer: Medicare Other

## 2019-02-23 DIAGNOSIS — N185 Chronic kidney disease, stage 5: Secondary | ICD-10-CM | POA: Diagnosis not present

## 2019-02-24 ENCOUNTER — Encounter: Payer: Self-pay | Admitting: Internal Medicine

## 2019-03-02 ENCOUNTER — Other Ambulatory Visit: Payer: Self-pay | Admitting: Family Medicine

## 2019-03-04 ENCOUNTER — Ambulatory Visit: Payer: Medicare Other | Admitting: Gastroenterology

## 2019-03-05 ENCOUNTER — Encounter: Payer: Self-pay | Admitting: Family Medicine

## 2019-03-08 DIAGNOSIS — R197 Diarrhea, unspecified: Secondary | ICD-10-CM | POA: Diagnosis not present

## 2019-03-08 DIAGNOSIS — R634 Abnormal weight loss: Secondary | ICD-10-CM | POA: Diagnosis not present

## 2019-03-08 DIAGNOSIS — R110 Nausea: Secondary | ICD-10-CM | POA: Diagnosis not present

## 2019-03-08 DIAGNOSIS — N185 Chronic kidney disease, stage 5: Secondary | ICD-10-CM | POA: Diagnosis not present

## 2019-03-08 DIAGNOSIS — Z01818 Encounter for other preprocedural examination: Secondary | ICD-10-CM | POA: Diagnosis not present

## 2019-03-08 DIAGNOSIS — Z01812 Encounter for preprocedural laboratory examination: Secondary | ICD-10-CM | POA: Diagnosis not present

## 2019-03-11 DIAGNOSIS — Z9641 Presence of insulin pump (external) (internal): Secondary | ICD-10-CM | POA: Diagnosis not present

## 2019-03-11 DIAGNOSIS — E039 Hypothyroidism, unspecified: Secondary | ICD-10-CM | POA: Diagnosis not present

## 2019-03-11 DIAGNOSIS — K648 Other hemorrhoids: Secondary | ICD-10-CM | POA: Diagnosis not present

## 2019-03-11 DIAGNOSIS — Z79899 Other long term (current) drug therapy: Secondary | ICD-10-CM | POA: Diagnosis not present

## 2019-03-11 DIAGNOSIS — E1036 Type 1 diabetes mellitus with diabetic cataract: Secondary | ICD-10-CM | POA: Diagnosis not present

## 2019-03-11 DIAGNOSIS — K219 Gastro-esophageal reflux disease without esophagitis: Secondary | ICD-10-CM | POA: Diagnosis not present

## 2019-03-11 DIAGNOSIS — Z794 Long term (current) use of insulin: Secondary | ICD-10-CM | POA: Diagnosis not present

## 2019-03-11 DIAGNOSIS — Z1211 Encounter for screening for malignant neoplasm of colon: Secondary | ICD-10-CM | POA: Diagnosis not present

## 2019-03-11 DIAGNOSIS — Z7982 Long term (current) use of aspirin: Secondary | ICD-10-CM | POA: Diagnosis not present

## 2019-03-11 DIAGNOSIS — K644 Residual hemorrhoidal skin tags: Secondary | ICD-10-CM | POA: Diagnosis not present

## 2019-03-11 DIAGNOSIS — H2512 Age-related nuclear cataract, left eye: Secondary | ICD-10-CM | POA: Diagnosis not present

## 2019-03-11 DIAGNOSIS — D123 Benign neoplasm of transverse colon: Secondary | ICD-10-CM | POA: Diagnosis not present

## 2019-03-11 DIAGNOSIS — E785 Hyperlipidemia, unspecified: Secondary | ICD-10-CM | POA: Diagnosis not present

## 2019-03-11 DIAGNOSIS — Z888 Allergy status to other drugs, medicaments and biological substances status: Secondary | ICD-10-CM | POA: Diagnosis not present

## 2019-03-11 DIAGNOSIS — Z884 Allergy status to anesthetic agent status: Secondary | ICD-10-CM | POA: Diagnosis not present

## 2019-03-11 DIAGNOSIS — K2960 Other gastritis without bleeding: Secondary | ICD-10-CM | POA: Diagnosis not present

## 2019-03-11 DIAGNOSIS — I10 Essential (primary) hypertension: Secondary | ICD-10-CM | POA: Diagnosis not present

## 2019-03-11 DIAGNOSIS — Z882 Allergy status to sulfonamides status: Secondary | ICD-10-CM | POA: Diagnosis not present

## 2019-03-12 ENCOUNTER — Other Ambulatory Visit: Payer: Self-pay | Admitting: Family Medicine

## 2019-03-12 DIAGNOSIS — R630 Anorexia: Secondary | ICD-10-CM

## 2019-03-12 DIAGNOSIS — R634 Abnormal weight loss: Secondary | ICD-10-CM

## 2019-03-17 ENCOUNTER — Ambulatory Visit (INDEPENDENT_AMBULATORY_CARE_PROVIDER_SITE_OTHER): Payer: Medicare Other | Admitting: Family Medicine

## 2019-03-17 ENCOUNTER — Encounter: Payer: Self-pay | Admitting: Family Medicine

## 2019-03-17 DIAGNOSIS — F419 Anxiety disorder, unspecified: Secondary | ICD-10-CM | POA: Diagnosis not present

## 2019-03-17 MED ORDER — LORAZEPAM 0.5 MG PO TABS: 0.5000 mg | ORAL_TABLET | Freq: Every day | ORAL | 3 refills | Status: DC | PRN

## 2019-03-17 NOTE — Progress Notes (Signed)
Virtual Visit via telephone Note  I connected with Patrick Brown on 03/17/19 at 1019 by telephone and verified that I am speaking with the correct person using two identifiers. Patrick Brown is currently located at home and no other people are currently with her during visit. The provider, Fransisca Kaufmann Dracen Reigle, MD is located in their office at time of visit.  Call ended at 1034  I discussed the limitations, risks, security and privacy concerns of performing an evaluation and management service by telephone and the availability of in person appointments. I also discussed with the patient that there may be a patient responsible charge related to this service. The patient expressed understanding and agreed to proceed.   History and Present Illness: Anxiety and panic episodes have been increasing.  His health is worsening and they are thinking of starting dialysis, he has increased frequency of taking the ativan  He is talking of putting on renal transplant list. Patient is sleeping at night well and still using his cpap.  Patient also takes Cymbalta and mirtazapine to help with sleep and with anxiety, looked up bupropion and based on his renal function I do not feel like that would be valid for him, a lot of his anxiety is situational and increased so we will increase the lorazepam for now to where he can take it more consistently on a daily basis once a day.  In a few months we will reevaluate and we may need to switch to Cymbalta for either Pristiq or Effexor. Current rx-Ativan 0.5 mg daily as needed # meds rx-30 Effectiveness of current meds-works well, he was only taking it infrequently but has needed it more frequently recently Adverse reactions form meds-none  Pill count performed-No Last drug screen -N/A ( high risk q27m, moderate risk q44m, low risk yearly ) Urine drug screen today- No, virtual appointment, will get at next appointment if still needs a refill then Was the [place]  reviewed-yes  If yes were their any concerning findings? -None  No flowsheet data found.   Controlled substance contract signed on: N/A  1. Anxiety     Outpatient Encounter Medications as of 03/17/2019  Medication Sig  . abacavir-dolutegravir-lamiVUDine (TRIUMEQ) 600-50-300 MG tablet Take 1 tablet by mouth daily.  Marland Kitchen acetaminophen (TYLENOL) 500 MG tablet Take 1,000 mg by mouth 2 (two) times daily.   Marland Kitchen acyclovir (ZOVIRAX) 400 MG tablet TAKE 1 TABLET (400 MG TOTAL) BY MOUTH 2 (TWO) TIMES DAILY.  . AgaMatrix Ultra-Thin Lancets MISC Test BS 4 times a day and prn Dx E10.65  . amLODipine (NORVASC) 10 MG tablet Take 1 tablet by mouth daily.  Marland Kitchen aspirin 81 MG chewable tablet Chew 81 mg by mouth every morning.  . cetirizine (ZYRTEC) 10 MG tablet TAKE 1 TABLET BY MOUTH EVERY DAY  . cyclobenzaprine (FLEXERIL) 10 MG tablet Take 1 tablet (10 mg total) by mouth 3 (three) times daily as needed for muscle spasms.  . diclofenac sodium (VOLTAREN) 1 % GEL Apply 2 g topically 4 (four) times daily.  . diphenoxylate-atropine (LOMOTIL) 2.5-0.025 MG tablet Take 1 tablet by mouth 4 (four) times daily as needed for diarrhea or loose stools.  . DULoxetine (CYMBALTA) 60 MG capsule TAKE 1 CAPSULE (60 MG TOTAL) BY MOUTH 2 (TWO) TIMES DAILY. AS DIRECTED  . esomeprazole (NEXIUM) 40 MG capsule TAKE 1 CAPSULE BY MOUTH EVERY DAY  . febuxostat (ULORIC) 40 MG tablet TAKE 1 TABLET BY MOUTH EVERY DAY  . ferrous sulfate 325 (65 FE) MG tablet  Take 650 mg by mouth daily with breakfast.   . fludrocortisone (FLORINEF) 0.1 MG tablet Take 0.1 mg by mouth daily.  . fluticasone (FLONASE) 50 MCG/ACT nasal spray SPRAY 2 SPRAYS INTO EACH NOSTRIL EVERY DAY  . furosemide (LASIX) 40 MG tablet Take 20 mg by mouth 2 (two) times daily.  . Glucosamine-Chondroit-Vit C-Mn (GLUCOSAMINE 1500 COMPLEX PO) Take 1 tablet by mouth 2 (two) times daily.   Marland Kitchen glucose blood (ONETOUCH VERIO) test strip TEST BLOOD SUGAR 4 TIMES DAILY AND AS NEEDED  .  hyoscyamine (LEVSIN) 0.125 MG tablet Take 1 tablet (0.125 mg total) by mouth every 4 (four) hours as needed.  . insulin lispro (HUMALOG) 100 UNIT/ML injection USE 42 UNITS TO 120 UNITS PER PUMP DAILY AS DIRECTED  . ipratropium (ATROVENT) 0.03 % nasal spray USE 2 SPRAY IN EACH NOSTRIL 2-3 TIMES DAILY  . levothyroxine (SYNTHROID) 150 MCG tablet Take 1 tablet (150 mcg total) by mouth daily.  Marland Kitchen LORazepam (ATIVAN) 0.5 MG tablet Take 1 tablet (0.5 mg total) by mouth daily as needed.  Marland Kitchen losartan (COZAAR) 100 MG tablet Take 100 mg by mouth daily.  . meclizine (ANTIVERT) 12.5 MG tablet Take 1 tablet (12.5 mg total) by mouth 3 (three) times daily as needed for dizziness.  . metoCLOPramide (REGLAN) 5 MG tablet TAKE 1 TABLET BY MOUTH 3 TIMES A DAY BEFORE MEALS  . mirtazapine (REMERON) 30 MG tablet TAKE 1 TABLET BY MOUTH AT BEDTIME.  . niacin (NIASPAN) 1000 MG CR tablet TAKE 1 TABLET (1,000 MG TOTAL) BY MOUTH AT BEDTIME. (Patient taking differently: TAKE 1 TABLET (1,500 MG TOTAL) BY MOUTH AT BEDTIME.)  . ondansetron (ZOFRAN) 4 MG tablet Take 1 tablet (4 mg total) by mouth every 8 (eight) hours as needed for nausea.  Marland Kitchen OVER THE COUNTER MEDICATION Take 1 capsule by mouth daily. [name]- probiotic daily  . polyethylene glycol powder (GLYCOLAX/MIRALAX) 17 GM/SCOOP powder Take 17 g by mouth daily as needed.  . promethazine (PHENERGAN) 25 MG suppository Place 1 suppository (25 mg total) rectally every 6 (six) hours as needed for nausea or vomiting.  . simvastatin (ZOCOR) 40 MG tablet TAKE 1 TABLET BY MOUTH EVERYDAY AT BEDTIME  . Testosterone 20.25 MG/ACT (1.62%) GEL APPLY 3 PUMPS DAILY AS DIRECTED  . VASCEPA 1 g CAPS TAKE 2 CAPSULES (2 G TOTAL) BY MOUTH 2 (TWO) TIMES DAILY.  . [DISCONTINUED] LORazepam (ATIVAN) 0.5 MG tablet TAKE 1 TABLET (0.5 MG TOTAL) BY MOUTH DAILY AS NEEDED.  . [DISCONTINUED] mupirocin cream (BACTROBAN) 2 % Apply 1 application topically 2 (two) times daily. (Patient not taking: Reported on  11/24/2018)  . [DISCONTINUED] traMADol (ULTRAM) 50 MG tablet Take 1 tablet (50 mg total) by mouth every 12 (twelve) hours as needed. for pain   No facility-administered encounter medications on file as of 03/17/2019.    Review of Systems  Constitutional: Negative for chills and fever.  Respiratory: Negative for shortness of breath and wheezing.   Cardiovascular: Negative for chest pain and leg swelling.  Musculoskeletal: Negative for back pain and gait problem.  Skin: Negative for rash.  Psychiatric/Behavioral: Positive for decreased concentration and dysphoric mood. Negative for self-injury, sleep disturbance and suicidal ideas. The patient is nervous/anxious.   All other systems reviewed and are negative.   Observations/Objective: Patient sounds comfortable and in no acute distress  Assessment and Plan: Problem List Items Addressed This Visit      Other   Anxiety   Relevant Medications   LORazepam (ATIVAN) 0.5 MG tablet  Will give refill for Ativan that he can use more consistently for now, discontinue the tramadol.  He was not using either on a daily basis until recently he is using Ativan more frequently because of all of his health issues causing stress and anxiety.  Continue duloxetine but if not improved by the next visit then we may consider switching the duloxetine Follow up plan: Return in about 4 months (around 07/15/2019), or if symptoms worsen or fail to improve, for Anxiety and chronic medication follow-up.     I discussed the assessment and treatment plan with the patient. The patient was provided an opportunity to ask questions and all were answered. The patient agreed with the plan and demonstrated an understanding of the instructions.   The patient was advised to call back or seek an in-person evaluation if the symptoms worsen or if the condition fails to improve as anticipated.  The above assessment and management plan was discussed with the patient. The  patient verbalized understanding of and has agreed to the management plan. Patient is aware to call the clinic if symptoms persist or worsen. Patient is aware when to return to the clinic for a follow-up visit. Patient educated on when it is appropriate to go to the emergency department.    I provided 15 minutes of non-face-to-face time during this encounter.    Worthy Rancher, MD

## 2019-03-19 ENCOUNTER — Other Ambulatory Visit: Payer: Self-pay | Admitting: Family Medicine

## 2019-03-23 ENCOUNTER — Other Ambulatory Visit: Payer: Medicare Other

## 2019-03-23 ENCOUNTER — Other Ambulatory Visit: Payer: Self-pay

## 2019-03-23 DIAGNOSIS — E039 Hypothyroidism, unspecified: Secondary | ICD-10-CM | POA: Diagnosis not present

## 2019-03-24 ENCOUNTER — Other Ambulatory Visit: Payer: Self-pay

## 2019-03-24 DIAGNOSIS — R7989 Other specified abnormal findings of blood chemistry: Secondary | ICD-10-CM

## 2019-03-24 LAB — TSH: TSH: <0.005 u[IU]/mL — ABNORMAL LOW (ref 0.450–4.500)

## 2019-03-24 MED ORDER — LEVOTHYROXINE SODIUM 137 MCG PO TABS: 137.0000 ug | ORAL_TABLET | Freq: Every day | ORAL | 6 refills | Status: DC

## 2019-03-26 ENCOUNTER — Other Ambulatory Visit: Payer: Self-pay | Admitting: Family Medicine

## 2019-04-01 DIAGNOSIS — L738 Other specified follicular disorders: Secondary | ICD-10-CM | POA: Diagnosis not present

## 2019-04-07 ENCOUNTER — Other Ambulatory Visit: Payer: Self-pay | Admitting: Family Medicine

## 2019-04-07 DIAGNOSIS — J301 Allergic rhinitis due to pollen: Secondary | ICD-10-CM

## 2019-04-09 ENCOUNTER — Telehealth: Payer: Self-pay | Admitting: Family Medicine

## 2019-04-09 NOTE — Chronic Care Management (AMB) (Signed)
Chronic Care Management  ° °Note ° °04/09/2019 °Name: Patrick Brown MRN: [id number] DOB: 08/15/1970 ° °Patrick Brown is a 48 y.o. year old male who is a primary care patient of Dettinger, Joshua A, MD. I reached out to Lillian Ahlberg by phone today in response to a referral sent by Patrick Brown's health plan.    ° °Patrick Brown was given information about Chronic Care Management services today including:  °1. CCM service includes personalized support from designated clinical staff supervised by his physician, including individualized plan of care and coordination with other care providers °2. 24/7 contact phone numbers for assistance for urgent and routine care needs. °3. Service will only be billed when office clinical staff spend 20 minutes or more in a month to coordinate care. °4. Only one practitioner may furnish and bill the service in a calendar month. °5. The patient may stop CCM services at any time (effective at the end of the month) by phone call to the office staff. °6. The patient will be responsible for cost sharing (co-pay) of up to 20% of the service fee (after annual deductible is met). ° °Patient agreed to services and verbal consent obtained.  ° °Follow up plan: °Telephone appointment with care management team member scheduled for: 06/10/2019 ° °Amber Wray, RMA °Care Guide, Embedded Care Coordination °[facility]  °[place], [place] [address] °Direct Dial: [phone] °[email or web address] °Website: [email or web address]  °

## 2019-04-19 DIAGNOSIS — N185 Chronic kidney disease, stage 5: Secondary | ICD-10-CM | POA: Diagnosis not present

## 2019-04-21 ENCOUNTER — Other Ambulatory Visit: Payer: Self-pay | Admitting: Family Medicine

## 2019-04-28 ENCOUNTER — Other Ambulatory Visit: Payer: Self-pay | Admitting: Family Medicine

## 2019-04-28 DIAGNOSIS — G4733 Obstructive sleep apnea (adult) (pediatric): Secondary | ICD-10-CM | POA: Diagnosis not present

## 2019-04-28 DIAGNOSIS — E349 Endocrine disorder, unspecified: Secondary | ICD-10-CM

## 2019-04-28 DIAGNOSIS — L738 Other specified follicular disorders: Secondary | ICD-10-CM | POA: Diagnosis not present

## 2019-04-28 NOTE — Telephone Encounter (Signed)
Last office visit 03/17/2019  Last refill 10/07/2018, 75 grams, 2 refills

## 2019-04-30 DIAGNOSIS — E1022 Type 1 diabetes mellitus with diabetic chronic kidney disease: Secondary | ICD-10-CM | POA: Diagnosis not present

## 2019-04-30 DIAGNOSIS — N183 Chronic kidney disease, stage 3 unspecified: Secondary | ICD-10-CM | POA: Diagnosis not present

## 2019-05-06 ENCOUNTER — Other Ambulatory Visit: Payer: Self-pay | Admitting: Family Medicine

## 2019-05-13 DIAGNOSIS — K8689 Other specified diseases of pancreas: Secondary | ICD-10-CM | POA: Diagnosis not present

## 2019-05-21 DIAGNOSIS — D631 Anemia in chronic kidney disease: Secondary | ICD-10-CM | POA: Diagnosis not present

## 2019-05-21 DIAGNOSIS — N185 Chronic kidney disease, stage 5: Secondary | ICD-10-CM | POA: Diagnosis not present

## 2019-05-24 ENCOUNTER — Other Ambulatory Visit: Payer: Self-pay | Admitting: Family Medicine

## 2019-05-25 DIAGNOSIS — D631 Anemia in chronic kidney disease: Secondary | ICD-10-CM | POA: Diagnosis not present

## 2019-05-25 DIAGNOSIS — N185 Chronic kidney disease, stage 5: Secondary | ICD-10-CM | POA: Diagnosis not present

## 2019-06-02 DIAGNOSIS — I10 Essential (primary) hypertension: Secondary | ICD-10-CM | POA: Diagnosis not present

## 2019-06-02 DIAGNOSIS — I120 Hypertensive chronic kidney disease with stage 5 chronic kidney disease or end stage renal disease: Secondary | ICD-10-CM | POA: Diagnosis not present

## 2019-06-02 DIAGNOSIS — G9340 Encephalopathy, unspecified: Secondary | ICD-10-CM | POA: Diagnosis not present

## 2019-06-02 DIAGNOSIS — D638 Anemia in other chronic diseases classified elsewhere: Secondary | ICD-10-CM | POA: Diagnosis not present

## 2019-06-02 DIAGNOSIS — Z882 Allergy status to sulfonamides status: Secondary | ICD-10-CM | POA: Diagnosis not present

## 2019-06-02 DIAGNOSIS — N179 Acute kidney failure, unspecified: Secondary | ICD-10-CM | POA: Diagnosis not present

## 2019-06-02 DIAGNOSIS — R05 Cough: Secondary | ICD-10-CM | POA: Diagnosis not present

## 2019-06-02 DIAGNOSIS — R9089 Other abnormal findings on diagnostic imaging of central nervous system: Secondary | ICD-10-CM | POA: Diagnosis not present

## 2019-06-02 DIAGNOSIS — T83518A Infection and inflammatory reaction due to other urinary catheter, initial encounter: Secondary | ICD-10-CM | POA: Diagnosis not present

## 2019-06-02 DIAGNOSIS — R2689 Other abnormalities of gait and mobility: Secondary | ICD-10-CM | POA: Diagnosis not present

## 2019-06-02 DIAGNOSIS — E269 Hyperaldosteronism, unspecified: Secondary | ICD-10-CM | POA: Diagnosis not present

## 2019-06-02 DIAGNOSIS — Z884 Allergy status to anesthetic agent status: Secondary | ICD-10-CM | POA: Diagnosis not present

## 2019-06-02 DIAGNOSIS — R29818 Other symptoms and signs involving the nervous system: Secondary | ICD-10-CM | POA: Diagnosis not present

## 2019-06-02 DIAGNOSIS — I6782 Cerebral ischemia: Secondary | ICD-10-CM | POA: Diagnosis not present

## 2019-06-02 DIAGNOSIS — E1022 Type 1 diabetes mellitus with diabetic chronic kidney disease: Secondary | ICD-10-CM | POA: Diagnosis not present

## 2019-06-02 DIAGNOSIS — Z9641 Presence of insulin pump (external) (internal): Secondary | ICD-10-CM | POA: Diagnosis not present

## 2019-06-02 DIAGNOSIS — D631 Anemia in chronic kidney disease: Secondary | ICD-10-CM | POA: Diagnosis not present

## 2019-06-02 DIAGNOSIS — E785 Hyperlipidemia, unspecified: Secondary | ICD-10-CM | POA: Diagnosis not present

## 2019-06-02 DIAGNOSIS — E039 Hypothyroidism, unspecified: Secondary | ICD-10-CM | POA: Diagnosis not present

## 2019-06-02 DIAGNOSIS — B9620 Unspecified Escherichia coli [E. coli] as the cause of diseases classified elsewhere: Secondary | ICD-10-CM | POA: Diagnosis not present

## 2019-06-02 DIAGNOSIS — G9341 Metabolic encephalopathy: Secondary | ICD-10-CM | POA: Diagnosis not present

## 2019-06-02 DIAGNOSIS — G9349 Other encephalopathy: Secondary | ICD-10-CM | POA: Diagnosis not present

## 2019-06-02 DIAGNOSIS — G92 Toxic encephalopathy: Secondary | ICD-10-CM | POA: Diagnosis not present

## 2019-06-02 DIAGNOSIS — Z96641 Presence of right artificial hip joint: Secondary | ICD-10-CM | POA: Diagnosis not present

## 2019-06-02 DIAGNOSIS — Z5329 Procedure and treatment not carried out because of patient's decision for other reasons: Secondary | ICD-10-CM | POA: Diagnosis not present

## 2019-06-02 DIAGNOSIS — N185 Chronic kidney disease, stage 5: Secondary | ICD-10-CM | POA: Diagnosis not present

## 2019-06-02 DIAGNOSIS — N170 Acute kidney failure with tubular necrosis: Secondary | ICD-10-CM | POA: Diagnosis not present

## 2019-06-02 DIAGNOSIS — Z7982 Long term (current) use of aspirin: Secondary | ICD-10-CM | POA: Diagnosis not present

## 2019-06-02 DIAGNOSIS — G4733 Obstructive sleep apnea (adult) (pediatric): Secondary | ICD-10-CM | POA: Diagnosis not present

## 2019-06-02 DIAGNOSIS — R918 Other nonspecific abnormal finding of lung field: Secondary | ICD-10-CM | POA: Diagnosis not present

## 2019-06-02 DIAGNOSIS — E1165 Type 2 diabetes mellitus with hyperglycemia: Secondary | ICD-10-CM | POA: Diagnosis not present

## 2019-06-02 DIAGNOSIS — K8689 Other specified diseases of pancreas: Secondary | ICD-10-CM | POA: Diagnosis not present

## 2019-06-02 DIAGNOSIS — I517 Cardiomegaly: Secondary | ICD-10-CM | POA: Diagnosis not present

## 2019-06-02 DIAGNOSIS — R9082 White matter disease, unspecified: Secondary | ICD-10-CM | POA: Diagnosis not present

## 2019-06-02 DIAGNOSIS — N184 Chronic kidney disease, stage 4 (severe): Secondary | ICD-10-CM | POA: Diagnosis not present

## 2019-06-02 DIAGNOSIS — I129 Hypertensive chronic kidney disease with stage 1 through stage 4 chronic kidney disease, or unspecified chronic kidney disease: Secondary | ICD-10-CM | POA: Diagnosis not present

## 2019-06-02 DIAGNOSIS — Z794 Long term (current) use of insulin: Secondary | ICD-10-CM | POA: Diagnosis not present

## 2019-06-02 DIAGNOSIS — N19 Unspecified kidney failure: Secondary | ICD-10-CM | POA: Diagnosis not present

## 2019-06-02 DIAGNOSIS — E1065 Type 1 diabetes mellitus with hyperglycemia: Secondary | ICD-10-CM | POA: Diagnosis not present

## 2019-06-02 DIAGNOSIS — Z79899 Other long term (current) drug therapy: Secondary | ICD-10-CM | POA: Diagnosis not present

## 2019-06-02 DIAGNOSIS — R339 Retention of urine, unspecified: Secondary | ICD-10-CM | POA: Diagnosis not present

## 2019-06-02 DIAGNOSIS — R410 Disorientation, unspecified: Secondary | ICD-10-CM | POA: Diagnosis not present

## 2019-06-02 DIAGNOSIS — K219 Gastro-esophageal reflux disease without esophagitis: Secondary | ICD-10-CM | POA: Diagnosis not present

## 2019-06-02 DIAGNOSIS — E872 Acidosis: Secondary | ICD-10-CM | POA: Diagnosis not present

## 2019-06-02 DIAGNOSIS — R739 Hyperglycemia, unspecified: Secondary | ICD-10-CM | POA: Diagnosis not present

## 2019-06-02 DIAGNOSIS — Z888 Allergy status to other drugs, medicaments and biological substances status: Secondary | ICD-10-CM | POA: Diagnosis not present

## 2019-06-02 DIAGNOSIS — Z7952 Long term (current) use of systemic steroids: Secondary | ICD-10-CM | POA: Diagnosis not present

## 2019-06-02 DIAGNOSIS — Z792 Long term (current) use of antibiotics: Secondary | ICD-10-CM | POA: Diagnosis not present

## 2019-06-02 DIAGNOSIS — R29810 Facial weakness: Secondary | ICD-10-CM | POA: Diagnosis not present

## 2019-06-02 DIAGNOSIS — N186 End stage renal disease: Secondary | ICD-10-CM | POA: Diagnosis not present

## 2019-06-07 ENCOUNTER — Other Ambulatory Visit: Payer: Self-pay | Admitting: Family Medicine

## 2019-06-07 DIAGNOSIS — R339 Retention of urine, unspecified: Secondary | ICD-10-CM | POA: Diagnosis not present

## 2019-06-07 DIAGNOSIS — R9082 White matter disease, unspecified: Secondary | ICD-10-CM | POA: Diagnosis not present

## 2019-06-07 DIAGNOSIS — E1022 Type 1 diabetes mellitus with diabetic chronic kidney disease: Secondary | ICD-10-CM | POA: Insufficient documentation

## 2019-06-07 DIAGNOSIS — E785 Hyperlipidemia, unspecified: Secondary | ICD-10-CM | POA: Diagnosis not present

## 2019-06-07 DIAGNOSIS — D631 Anemia in chronic kidney disease: Secondary | ICD-10-CM | POA: Diagnosis not present

## 2019-06-07 DIAGNOSIS — G4733 Obstructive sleep apnea (adult) (pediatric): Secondary | ICD-10-CM | POA: Diagnosis not present

## 2019-06-07 DIAGNOSIS — E1065 Type 1 diabetes mellitus with hyperglycemia: Secondary | ICD-10-CM | POA: Diagnosis not present

## 2019-06-07 DIAGNOSIS — I10 Essential (primary) hypertension: Secondary | ICD-10-CM | POA: Diagnosis not present

## 2019-06-07 DIAGNOSIS — Z884 Allergy status to anesthetic agent status: Secondary | ICD-10-CM | POA: Diagnosis not present

## 2019-06-07 DIAGNOSIS — N184 Chronic kidney disease, stage 4 (severe): Secondary | ICD-10-CM | POA: Diagnosis not present

## 2019-06-07 DIAGNOSIS — Z79899 Other long term (current) drug therapy: Secondary | ICD-10-CM | POA: Diagnosis not present

## 2019-06-07 DIAGNOSIS — Z794 Long term (current) use of insulin: Secondary | ICD-10-CM | POA: Diagnosis not present

## 2019-06-07 DIAGNOSIS — I6782 Cerebral ischemia: Secondary | ICD-10-CM | POA: Diagnosis not present

## 2019-06-07 DIAGNOSIS — B9620 Unspecified Escherichia coli [E. coli] as the cause of diseases classified elsewhere: Secondary | ICD-10-CM | POA: Diagnosis not present

## 2019-06-07 DIAGNOSIS — E039 Hypothyroidism, unspecified: Secondary | ICD-10-CM | POA: Diagnosis not present

## 2019-06-07 DIAGNOSIS — G9341 Metabolic encephalopathy: Secondary | ICD-10-CM | POA: Diagnosis not present

## 2019-06-07 DIAGNOSIS — R9089 Other abnormal findings on diagnostic imaging of central nervous system: Secondary | ICD-10-CM | POA: Diagnosis not present

## 2019-06-07 DIAGNOSIS — I129 Hypertensive chronic kidney disease with stage 1 through stage 4 chronic kidney disease, or unspecified chronic kidney disease: Secondary | ICD-10-CM | POA: Diagnosis not present

## 2019-06-07 DIAGNOSIS — R29818 Other symptoms and signs involving the nervous system: Secondary | ICD-10-CM | POA: Diagnosis not present

## 2019-06-07 DIAGNOSIS — T83518A Infection and inflammatory reaction due to other urinary catheter, initial encounter: Secondary | ICD-10-CM | POA: Diagnosis not present

## 2019-06-07 DIAGNOSIS — G9340 Encephalopathy, unspecified: Secondary | ICD-10-CM | POA: Diagnosis not present

## 2019-06-07 DIAGNOSIS — Z96641 Presence of right artificial hip joint: Secondary | ICD-10-CM | POA: Diagnosis not present

## 2019-06-07 DIAGNOSIS — E1165 Type 2 diabetes mellitus with hyperglycemia: Secondary | ICD-10-CM | POA: Diagnosis not present

## 2019-06-07 DIAGNOSIS — R634 Abnormal weight loss: Secondary | ICD-10-CM

## 2019-06-07 DIAGNOSIS — R630 Anorexia: Secondary | ICD-10-CM

## 2019-06-07 DIAGNOSIS — Z792 Long term (current) use of antibiotics: Secondary | ICD-10-CM | POA: Diagnosis not present

## 2019-06-07 DIAGNOSIS — Z9641 Presence of insulin pump (external) (internal): Secondary | ICD-10-CM | POA: Diagnosis not present

## 2019-06-07 DIAGNOSIS — Z7982 Long term (current) use of aspirin: Secondary | ICD-10-CM | POA: Diagnosis not present

## 2019-06-07 DIAGNOSIS — Z882 Allergy status to sulfonamides status: Secondary | ICD-10-CM | POA: Diagnosis not present

## 2019-06-07 DIAGNOSIS — K8689 Other specified diseases of pancreas: Secondary | ICD-10-CM | POA: Diagnosis not present

## 2019-06-07 MED ORDER — FEBUXOSTAT 40 MG PO TABS
40.00 | ORAL_TABLET | ORAL | Status: DC
Start: 2019-06-08 — End: 2019-06-07

## 2019-06-07 MED ORDER — LORAZEPAM 0.5 MG PO TABS
0.50 | ORAL_TABLET | ORAL | Status: DC
Start: ? — End: 2019-06-07

## 2019-06-07 MED ORDER — AMLODIPINE BESYLATE 10 MG PO TABS
10.00 | ORAL_TABLET | ORAL | Status: DC
Start: 2019-06-08 — End: 2019-06-07

## 2019-06-07 MED ORDER — GENERIC EXTERNAL MEDICATION
Status: DC
Start: ? — End: 2019-06-07

## 2019-06-07 MED ORDER — INSULIN GLARGINE 100 UNIT/ML ~~LOC~~ SOLN
25.00 | SUBCUTANEOUS | Status: DC
Start: 2019-06-07 — End: 2019-06-07

## 2019-06-07 MED ORDER — DULOXETINE HCL 60 MG PO CPEP
60.00 | ORAL_CAPSULE | ORAL | Status: DC
Start: 2019-06-08 — End: 2019-06-07

## 2019-06-07 MED ORDER — METOPROLOL SUCCINATE ER 25 MG PO TB24
25.00 | ORAL_TABLET | ORAL | Status: DC
Start: 2019-06-08 — End: 2019-06-07

## 2019-06-07 MED ORDER — NIACIN ER (ANTIHYPERLIPIDEMIC) 500 MG PO TBCR
1500.00 | EXTENDED_RELEASE_TABLET | ORAL | Status: DC
Start: 2019-06-07 — End: 2019-06-07

## 2019-06-07 MED ORDER — MIRTAZAPINE 15 MG PO TABS
30.00 | ORAL_TABLET | ORAL | Status: DC
Start: 2019-06-07 — End: 2019-06-07

## 2019-06-07 MED ORDER — PANTOPRAZOLE SODIUM 40 MG PO TBEC
40.00 | DELAYED_RELEASE_TABLET | ORAL | Status: DC
Start: 2019-06-08 — End: 2019-06-07

## 2019-06-07 MED ORDER — CLOTRIMAZOLE 1 % EX CREA
TOPICAL_CREAM | CUTANEOUS | Status: DC
Start: 2019-06-07 — End: 2019-06-07

## 2019-06-07 MED ORDER — CYCLOBENZAPRINE HCL 10 MG PO TABS
5.00 | ORAL_TABLET | ORAL | Status: DC
Start: ? — End: 2019-06-07

## 2019-06-07 MED ORDER — PANCRELIPASE (LIP-PROT-AMYL) 12000-38000 UNITS PO CPEP
24000.00 | ORAL_CAPSULE | ORAL | Status: DC
Start: 2019-06-07 — End: 2019-06-07

## 2019-06-07 MED ORDER — METOCLOPRAMIDE HCL 10 MG PO TABS
5.00 | ORAL_TABLET | ORAL | Status: DC
Start: 2019-06-07 — End: 2019-06-07

## 2019-06-07 MED ORDER — ATORVASTATIN CALCIUM 20 MG PO TABS
20.00 | ORAL_TABLET | ORAL | Status: DC
Start: 2019-06-07 — End: 2019-06-07

## 2019-06-07 MED ORDER — LORAZEPAM 0.5 MG PO TABS
0.50 | ORAL_TABLET | ORAL | Status: DC
Start: 2019-06-08 — End: 2019-06-07

## 2019-06-07 MED ORDER — GENERIC EXTERNAL MEDICATION
100.00 | Status: DC
Start: ? — End: 2019-06-07

## 2019-06-07 MED ORDER — TAMSULOSIN HCL 0.4 MG PO CAPS
0.40 | ORAL_CAPSULE | ORAL | Status: DC
Start: 2019-06-08 — End: 2019-06-07

## 2019-06-07 MED ORDER — FLUTICASONE PROPIONATE 50 MCG/ACT NA SUSP
1.00 | NASAL | Status: DC
Start: 2019-06-08 — End: 2019-06-07

## 2019-06-07 MED ORDER — FERROUS SULFATE 325 (65 FE) MG PO TABS
325.00 | ORAL_TABLET | ORAL | Status: DC
Start: 2019-06-07 — End: 2019-06-07

## 2019-06-07 MED ORDER — LAMIVUDINE 150 MG PO TABS
300.00 | ORAL_TABLET | ORAL | Status: DC
Start: 2019-06-08 — End: 2019-06-07

## 2019-06-07 MED ORDER — EPOETIN ALFA-EPBX 4000 UNIT/ML IJ SOLN
10000.00 | INTRAMUSCULAR | Status: DC
Start: 2019-06-14 — End: 2019-06-07

## 2019-06-07 MED ORDER — ASPIRIN 81 MG PO CHEW
81.00 | CHEWABLE_TABLET | ORAL | Status: DC
Start: 2019-06-08 — End: 2019-06-07

## 2019-06-07 MED ORDER — GENERIC EXTERNAL MEDICATION
150.00 | Status: DC
Start: 2019-06-07 — End: 2019-06-07

## 2019-06-07 MED ORDER — SODIUM CHLORIDE 0.9 % IV SOLN
10.00 | INTRAVENOUS | Status: DC
Start: ? — End: 2019-06-07

## 2019-06-07 MED ORDER — ALBUTEROL SULFATE (2.5 MG/3ML) 0.083% IN NEBU
2.50 | INHALATION_SOLUTION | RESPIRATORY_TRACT | Status: DC
Start: ? — End: 2019-06-07

## 2019-06-07 MED ORDER — FLUDROCORTISONE ACETATE 0.1 MG PO TABS
200.00 | ORAL_TABLET | ORAL | Status: DC
Start: 2019-06-08 — End: 2019-06-07

## 2019-06-07 MED ORDER — POLYETHYLENE GLYCOL 3350 17 GM/SCOOP PO POWD
17.00 | ORAL | Status: DC
Start: 2019-06-08 — End: 2019-06-07

## 2019-06-07 MED ORDER — ONDANSETRON HCL 4 MG PO TABS
4.00 | ORAL_TABLET | ORAL | Status: DC
Start: ? — End: 2019-06-07

## 2019-06-07 MED ORDER — ABACAVIR SULFATE 300 MG PO TABS
600.00 | ORAL_TABLET | ORAL | Status: DC
Start: 2019-06-08 — End: 2019-06-07

## 2019-06-07 MED ORDER — HYDROMORPHONE HCL 1 MG/ML IJ SOLN
0.20 | INTRAMUSCULAR | Status: DC
Start: ? — End: 2019-06-07

## 2019-06-07 MED ORDER — INSULIN LISPRO 100 UNIT/ML ~~LOC~~ SOLN
0.00 | SUBCUTANEOUS | Status: DC
Start: 2019-06-07 — End: 2019-06-07

## 2019-06-07 MED ORDER — DOLUTEGRAVIR SODIUM 50 MG PO TABS
50.00 | ORAL_TABLET | ORAL | Status: DC
Start: 2019-06-08 — End: 2019-06-07

## 2019-06-08 DIAGNOSIS — Z96641 Presence of right artificial hip joint: Secondary | ICD-10-CM | POA: Diagnosis not present

## 2019-06-08 DIAGNOSIS — Z882 Allergy status to sulfonamides status: Secondary | ICD-10-CM | POA: Diagnosis not present

## 2019-06-08 DIAGNOSIS — Z7982 Long term (current) use of aspirin: Secondary | ICD-10-CM | POA: Diagnosis not present

## 2019-06-08 DIAGNOSIS — G9340 Encephalopathy, unspecified: Secondary | ICD-10-CM | POA: Diagnosis not present

## 2019-06-08 DIAGNOSIS — E039 Hypothyroidism, unspecified: Secondary | ICD-10-CM | POA: Diagnosis not present

## 2019-06-08 DIAGNOSIS — Z79899 Other long term (current) drug therapy: Secondary | ICD-10-CM | POA: Diagnosis not present

## 2019-06-08 DIAGNOSIS — Z792 Long term (current) use of antibiotics: Secondary | ICD-10-CM | POA: Diagnosis not present

## 2019-06-08 DIAGNOSIS — I129 Hypertensive chronic kidney disease with stage 1 through stage 4 chronic kidney disease, or unspecified chronic kidney disease: Secondary | ICD-10-CM | POA: Diagnosis not present

## 2019-06-08 DIAGNOSIS — Z794 Long term (current) use of insulin: Secondary | ICD-10-CM | POA: Diagnosis not present

## 2019-06-08 DIAGNOSIS — G9341 Metabolic encephalopathy: Secondary | ICD-10-CM | POA: Diagnosis not present

## 2019-06-08 DIAGNOSIS — K8689 Other specified diseases of pancreas: Secondary | ICD-10-CM | POA: Diagnosis not present

## 2019-06-08 DIAGNOSIS — E1022 Type 1 diabetes mellitus with diabetic chronic kidney disease: Secondary | ICD-10-CM | POA: Diagnosis not present

## 2019-06-08 DIAGNOSIS — T83518A Infection and inflammatory reaction due to other urinary catheter, initial encounter: Secondary | ICD-10-CM | POA: Diagnosis not present

## 2019-06-08 DIAGNOSIS — Z884 Allergy status to anesthetic agent status: Secondary | ICD-10-CM | POA: Diagnosis not present

## 2019-06-08 DIAGNOSIS — D631 Anemia in chronic kidney disease: Secondary | ICD-10-CM | POA: Diagnosis not present

## 2019-06-08 DIAGNOSIS — N184 Chronic kidney disease, stage 4 (severe): Secondary | ICD-10-CM | POA: Diagnosis not present

## 2019-06-08 DIAGNOSIS — G4733 Obstructive sleep apnea (adult) (pediatric): Secondary | ICD-10-CM | POA: Diagnosis not present

## 2019-06-08 DIAGNOSIS — E1065 Type 1 diabetes mellitus with hyperglycemia: Secondary | ICD-10-CM | POA: Diagnosis not present

## 2019-06-08 DIAGNOSIS — R339 Retention of urine, unspecified: Secondary | ICD-10-CM | POA: Diagnosis not present

## 2019-06-08 DIAGNOSIS — B9620 Unspecified Escherichia coli [E. coli] as the cause of diseases classified elsewhere: Secondary | ICD-10-CM | POA: Diagnosis not present

## 2019-06-08 DIAGNOSIS — N170 Acute kidney failure with tubular necrosis: Secondary | ICD-10-CM | POA: Diagnosis not present

## 2019-06-08 DIAGNOSIS — Z9641 Presence of insulin pump (external) (internal): Secondary | ICD-10-CM | POA: Diagnosis not present

## 2019-06-08 DIAGNOSIS — E785 Hyperlipidemia, unspecified: Secondary | ICD-10-CM | POA: Diagnosis not present

## 2019-06-08 DIAGNOSIS — R739 Hyperglycemia, unspecified: Secondary | ICD-10-CM | POA: Diagnosis not present

## 2019-06-09 DIAGNOSIS — Z884 Allergy status to anesthetic agent status: Secondary | ICD-10-CM | POA: Diagnosis not present

## 2019-06-09 DIAGNOSIS — E1065 Type 1 diabetes mellitus with hyperglycemia: Secondary | ICD-10-CM | POA: Diagnosis not present

## 2019-06-09 DIAGNOSIS — Z794 Long term (current) use of insulin: Secondary | ICD-10-CM | POA: Diagnosis not present

## 2019-06-09 DIAGNOSIS — N170 Acute kidney failure with tubular necrosis: Secondary | ICD-10-CM | POA: Diagnosis not present

## 2019-06-09 DIAGNOSIS — E1022 Type 1 diabetes mellitus with diabetic chronic kidney disease: Secondary | ICD-10-CM | POA: Diagnosis not present

## 2019-06-09 DIAGNOSIS — Z792 Long term (current) use of antibiotics: Secondary | ICD-10-CM | POA: Diagnosis not present

## 2019-06-09 DIAGNOSIS — Z79899 Other long term (current) drug therapy: Secondary | ICD-10-CM | POA: Diagnosis not present

## 2019-06-09 DIAGNOSIS — K8689 Other specified diseases of pancreas: Secondary | ICD-10-CM | POA: Diagnosis not present

## 2019-06-09 DIAGNOSIS — G4733 Obstructive sleep apnea (adult) (pediatric): Secondary | ICD-10-CM | POA: Diagnosis not present

## 2019-06-09 DIAGNOSIS — Z7982 Long term (current) use of aspirin: Secondary | ICD-10-CM | POA: Diagnosis not present

## 2019-06-09 DIAGNOSIS — E039 Hypothyroidism, unspecified: Secondary | ICD-10-CM | POA: Diagnosis not present

## 2019-06-09 DIAGNOSIS — Z96641 Presence of right artificial hip joint: Secondary | ICD-10-CM | POA: Diagnosis not present

## 2019-06-09 DIAGNOSIS — E785 Hyperlipidemia, unspecified: Secondary | ICD-10-CM | POA: Diagnosis not present

## 2019-06-09 DIAGNOSIS — G9341 Metabolic encephalopathy: Secondary | ICD-10-CM | POA: Diagnosis not present

## 2019-06-09 DIAGNOSIS — Z9641 Presence of insulin pump (external) (internal): Secondary | ICD-10-CM | POA: Diagnosis not present

## 2019-06-09 DIAGNOSIS — B9620 Unspecified Escherichia coli [E. coli] as the cause of diseases classified elsewhere: Secondary | ICD-10-CM | POA: Diagnosis not present

## 2019-06-09 DIAGNOSIS — T83518A Infection and inflammatory reaction due to other urinary catheter, initial encounter: Secondary | ICD-10-CM | POA: Diagnosis not present

## 2019-06-09 DIAGNOSIS — R339 Retention of urine, unspecified: Secondary | ICD-10-CM | POA: Diagnosis not present

## 2019-06-09 DIAGNOSIS — G9340 Encephalopathy, unspecified: Secondary | ICD-10-CM | POA: Diagnosis not present

## 2019-06-09 DIAGNOSIS — Z882 Allergy status to sulfonamides status: Secondary | ICD-10-CM | POA: Diagnosis not present

## 2019-06-09 DIAGNOSIS — D631 Anemia in chronic kidney disease: Secondary | ICD-10-CM | POA: Diagnosis not present

## 2019-06-09 DIAGNOSIS — N184 Chronic kidney disease, stage 4 (severe): Secondary | ICD-10-CM | POA: Diagnosis not present

## 2019-06-09 DIAGNOSIS — I129 Hypertensive chronic kidney disease with stage 1 through stage 4 chronic kidney disease, or unspecified chronic kidney disease: Secondary | ICD-10-CM | POA: Diagnosis not present

## 2019-06-10 ENCOUNTER — Telehealth: Payer: Self-pay | Admitting: Family Medicine

## 2019-06-10 ENCOUNTER — Ambulatory Visit: Payer: Medicare Other | Admitting: *Deleted

## 2019-06-10 DIAGNOSIS — N1832 Chronic kidney disease, stage 3b: Secondary | ICD-10-CM

## 2019-06-10 DIAGNOSIS — D631 Anemia in chronic kidney disease: Secondary | ICD-10-CM | POA: Diagnosis not present

## 2019-06-10 DIAGNOSIS — D509 Iron deficiency anemia, unspecified: Secondary | ICD-10-CM

## 2019-06-10 DIAGNOSIS — B20 Human immunodeficiency virus [HIV] disease: Secondary | ICD-10-CM

## 2019-06-10 DIAGNOSIS — G9340 Encephalopathy, unspecified: Secondary | ICD-10-CM | POA: Diagnosis not present

## 2019-06-10 DIAGNOSIS — E785 Hyperlipidemia, unspecified: Secondary | ICD-10-CM | POA: Diagnosis not present

## 2019-06-10 DIAGNOSIS — Z7982 Long term (current) use of aspirin: Secondary | ICD-10-CM | POA: Diagnosis not present

## 2019-06-10 DIAGNOSIS — Z792 Long term (current) use of antibiotics: Secondary | ICD-10-CM | POA: Diagnosis not present

## 2019-06-10 DIAGNOSIS — R339 Retention of urine, unspecified: Secondary | ICD-10-CM | POA: Diagnosis not present

## 2019-06-10 DIAGNOSIS — D638 Anemia in other chronic diseases classified elsewhere: Secondary | ICD-10-CM | POA: Diagnosis not present

## 2019-06-10 DIAGNOSIS — G4733 Obstructive sleep apnea (adult) (pediatric): Secondary | ICD-10-CM | POA: Diagnosis not present

## 2019-06-10 DIAGNOSIS — E1021 Type 1 diabetes mellitus with diabetic nephropathy: Secondary | ICD-10-CM

## 2019-06-10 DIAGNOSIS — E039 Hypothyroidism, unspecified: Secondary | ICD-10-CM | POA: Diagnosis not present

## 2019-06-10 DIAGNOSIS — E1022 Type 1 diabetes mellitus with diabetic chronic kidney disease: Secondary | ICD-10-CM | POA: Diagnosis not present

## 2019-06-10 DIAGNOSIS — Z884 Allergy status to anesthetic agent status: Secondary | ICD-10-CM | POA: Diagnosis not present

## 2019-06-10 DIAGNOSIS — Z794 Long term (current) use of insulin: Secondary | ICD-10-CM | POA: Diagnosis not present

## 2019-06-10 DIAGNOSIS — T83518A Infection and inflammatory reaction due to other urinary catheter, initial encounter: Secondary | ICD-10-CM | POA: Diagnosis not present

## 2019-06-10 DIAGNOSIS — N184 Chronic kidney disease, stage 4 (severe): Secondary | ICD-10-CM | POA: Diagnosis not present

## 2019-06-10 DIAGNOSIS — G9341 Metabolic encephalopathy: Secondary | ICD-10-CM | POA: Diagnosis not present

## 2019-06-10 DIAGNOSIS — Z882 Allergy status to sulfonamides status: Secondary | ICD-10-CM | POA: Diagnosis not present

## 2019-06-10 DIAGNOSIS — Z79899 Other long term (current) drug therapy: Secondary | ICD-10-CM | POA: Diagnosis not present

## 2019-06-10 DIAGNOSIS — B9620 Unspecified Escherichia coli [E. coli] as the cause of diseases classified elsewhere: Secondary | ICD-10-CM | POA: Diagnosis not present

## 2019-06-10 DIAGNOSIS — N170 Acute kidney failure with tubular necrosis: Secondary | ICD-10-CM | POA: Diagnosis not present

## 2019-06-10 DIAGNOSIS — K8689 Other specified diseases of pancreas: Secondary | ICD-10-CM | POA: Diagnosis not present

## 2019-06-10 DIAGNOSIS — Z9641 Presence of insulin pump (external) (internal): Secondary | ICD-10-CM | POA: Diagnosis not present

## 2019-06-10 DIAGNOSIS — Z96641 Presence of right artificial hip joint: Secondary | ICD-10-CM | POA: Diagnosis not present

## 2019-06-10 DIAGNOSIS — E1065 Type 1 diabetes mellitus with hyperglycemia: Secondary | ICD-10-CM | POA: Diagnosis not present

## 2019-06-10 DIAGNOSIS — I129 Hypertensive chronic kidney disease with stage 1 through stage 4 chronic kidney disease, or unspecified chronic kidney disease: Secondary | ICD-10-CM | POA: Diagnosis not present

## 2019-06-10 MED ORDER — MUPIROCIN 2 % EX OINT
TOPICAL_OINTMENT | CUTANEOUS | Status: DC
Start: 2019-06-10 — End: 2019-06-10

## 2019-06-10 MED ORDER — MELATONIN 3 MG PO TABS
6.00 | ORAL_TABLET | ORAL | Status: DC
Start: ? — End: 2019-06-10

## 2019-06-10 MED ORDER — ASPIRIN 81 MG PO CHEW
81.00 | CHEWABLE_TABLET | ORAL | Status: DC
Start: 2019-06-15 — End: 2019-06-10

## 2019-06-10 MED ORDER — INSULIN GLARGINE 100 UNIT/ML ~~LOC~~ SOLN
20.00 | SUBCUTANEOUS | Status: DC
Start: 2019-06-11 — End: 2019-06-10

## 2019-06-10 MED ORDER — BENZONATATE 100 MG PO CAPS
100.00 | ORAL_CAPSULE | ORAL | Status: DC
Start: ? — End: 2019-06-10

## 2019-06-10 MED ORDER — LORAZEPAM 2 MG/ML IJ SOLN
1.00 | INTRAMUSCULAR | Status: DC
Start: ? — End: 2019-06-10

## 2019-06-10 MED ORDER — SODIUM CHLORIDE 0.9 % IV SOLN
10.00 | INTRAVENOUS | Status: DC
Start: ? — End: 2019-06-10

## 2019-06-10 MED ORDER — POLYETHYLENE GLYCOL 3350 17 GM/SCOOP PO POWD
17.00 | ORAL | Status: DC
Start: ? — End: 2019-06-10

## 2019-06-10 MED ORDER — HEPARIN SODIUM (PORCINE) 5000 UNIT/ML IJ SOLN
5000.00 | INTRAMUSCULAR | Status: DC
Start: 2019-06-14 — End: 2019-06-10

## 2019-06-10 MED ORDER — AMLODIPINE BESYLATE 5 MG PO TABS
5.00 | ORAL_TABLET | ORAL | Status: DC
Start: 2019-06-15 — End: 2019-06-10

## 2019-06-10 MED ORDER — FERROUS SULFATE 325 (65 FE) MG PO TABS
650.00 | ORAL_TABLET | ORAL | Status: DC
Start: 2019-06-15 — End: 2019-06-10

## 2019-06-10 MED ORDER — LABETALOL HCL 5 MG/ML IV SOLN
10.00 | INTRAVENOUS | Status: DC
Start: ? — End: 2019-06-10

## 2019-06-10 MED ORDER — GENERIC EXTERNAL MEDICATION
Status: DC
Start: ? — End: 2019-06-10

## 2019-06-10 MED ORDER — FLUDROCORTISONE ACETATE 0.1 MG PO TABS
200.00 | ORAL_TABLET | ORAL | Status: DC
Start: 2019-06-15 — End: 2019-06-10

## 2019-06-10 MED ORDER — PANTOPRAZOLE SODIUM 40 MG PO TBEC
40.00 | DELAYED_RELEASE_TABLET | ORAL | Status: DC
Start: 2019-06-15 — End: 2019-06-10

## 2019-06-10 MED ORDER — DULOXETINE HCL 60 MG PO CPEP
60.00 | ORAL_CAPSULE | ORAL | Status: DC
Start: 2019-06-11 — End: 2019-06-10

## 2019-06-10 MED ORDER — GENERIC EXTERNAL MEDICATION
137.00 | Status: DC
Start: 2019-06-10 — End: 2019-06-10

## 2019-06-10 MED ORDER — GENERIC EXTERNAL MEDICATION
1.00 | Status: DC
Start: 2019-06-10 — End: 2019-06-10

## 2019-06-10 MED ORDER — INSULIN LISPRO 100 UNIT/ML ~~LOC~~ SOLN
3.00 | SUBCUTANEOUS | Status: DC
Start: 2019-06-10 — End: 2019-06-10

## 2019-06-10 NOTE — Chronic Care Management (AMB) (Signed)
  Chronic Care Management   Outreach Note  06/10/2019 Name: Patrick Brown MRN: [id number] DOB: 09/06/1970  Referred by: Dettinger, Fransisca Kaufmann, MD Reason for referral : Chronic Care Management (Initial Visit)   An unsuccessful Initial Telephone Visit was attempted today. The patient was referred to the case management team for assistance with care management and care coordination.   RN Care Plan           This Visit's Progress   . Chronic Disease Management Needs       CARE PLAN ENTRY (see longtitudinal plan of care for additional care plan information)  Current Barriers:  . Chronic Disease Management support, education, and care coordination needs related to CKD, DM, HTN, Anemia, HTN, hypothyroidism, gastroparesis  Clinical Goals: . Over the next 10 days, patient will be contacted by a Care Guide to reschedule their Initial CCM Visit . Over the next 30 days, patient will have an Initial CCM Visit with a member of the embedded CCM team to discuss self-management of their chronic medical conditions  Interventions: . Chart reviewed in preparation for initial visit telephone call . Collaboration with other care team members as needed . Unsuccessful outreach to patient  . A HIPPA compliant phone message was left for the patient providing contact information and requesting a return call.  . Request sent to care guides to reach out and reschedule patient's initial visit  Patient Self Care Activities: . Undetermined   Initial goal documentation        Follow Up Plan: The care management team will reach out to the patient again over the next 10 days.   Chong Sicilian, BSN, RN-BC Embedded Chronic Care Manager [facility] Direct Dial: [phone]

## 2019-06-10 NOTE — Telephone Encounter (Signed)
First of all for me looking at the chart I would not be able to tell if he is getting optimal care or not because I can only see what is documented.  I will say that in most instances most physicians and nurses are looking out for the wellbeing of their patients and I do not recommend leaving AMA as it is usually not a good outcome for patients who leave AMA from any facility.  Again this is always your decision but I would be concerned about his health especially since he has had some positive cultures and possibly the worsening of his health if leaving AMA.  Obviously you always have to wait your decisions positive negative.

## 2019-06-10 NOTE — Telephone Encounter (Signed)
Ms Patrick Brown is aware of provider's advice and says thank you for reviewing.

## 2019-06-11 DIAGNOSIS — E1065 Type 1 diabetes mellitus with hyperglycemia: Secondary | ICD-10-CM | POA: Diagnosis not present

## 2019-06-11 DIAGNOSIS — R339 Retention of urine, unspecified: Secondary | ICD-10-CM | POA: Diagnosis not present

## 2019-06-11 DIAGNOSIS — D631 Anemia in chronic kidney disease: Secondary | ICD-10-CM | POA: Diagnosis not present

## 2019-06-11 DIAGNOSIS — Z882 Allergy status to sulfonamides status: Secondary | ICD-10-CM | POA: Diagnosis not present

## 2019-06-11 DIAGNOSIS — Z79899 Other long term (current) drug therapy: Secondary | ICD-10-CM | POA: Diagnosis not present

## 2019-06-11 DIAGNOSIS — G4733 Obstructive sleep apnea (adult) (pediatric): Secondary | ICD-10-CM | POA: Diagnosis not present

## 2019-06-11 DIAGNOSIS — B9620 Unspecified Escherichia coli [E. coli] as the cause of diseases classified elsewhere: Secondary | ICD-10-CM | POA: Diagnosis not present

## 2019-06-11 DIAGNOSIS — E785 Hyperlipidemia, unspecified: Secondary | ICD-10-CM | POA: Diagnosis not present

## 2019-06-11 DIAGNOSIS — Z7982 Long term (current) use of aspirin: Secondary | ICD-10-CM | POA: Diagnosis not present

## 2019-06-11 DIAGNOSIS — D638 Anemia in other chronic diseases classified elsewhere: Secondary | ICD-10-CM | POA: Diagnosis not present

## 2019-06-11 DIAGNOSIS — Z9641 Presence of insulin pump (external) (internal): Secondary | ICD-10-CM | POA: Diagnosis not present

## 2019-06-11 DIAGNOSIS — E1022 Type 1 diabetes mellitus with diabetic chronic kidney disease: Secondary | ICD-10-CM | POA: Diagnosis not present

## 2019-06-11 DIAGNOSIS — G9341 Metabolic encephalopathy: Secondary | ICD-10-CM | POA: Diagnosis not present

## 2019-06-11 DIAGNOSIS — Z884 Allergy status to anesthetic agent status: Secondary | ICD-10-CM | POA: Diagnosis not present

## 2019-06-11 DIAGNOSIS — T83518A Infection and inflammatory reaction due to other urinary catheter, initial encounter: Secondary | ICD-10-CM | POA: Diagnosis not present

## 2019-06-11 DIAGNOSIS — I129 Hypertensive chronic kidney disease with stage 1 through stage 4 chronic kidney disease, or unspecified chronic kidney disease: Secondary | ICD-10-CM | POA: Diagnosis not present

## 2019-06-11 DIAGNOSIS — G9340 Encephalopathy, unspecified: Secondary | ICD-10-CM | POA: Diagnosis not present

## 2019-06-11 DIAGNOSIS — N170 Acute kidney failure with tubular necrosis: Secondary | ICD-10-CM | POA: Diagnosis not present

## 2019-06-11 DIAGNOSIS — G92 Toxic encephalopathy: Secondary | ICD-10-CM | POA: Diagnosis not present

## 2019-06-11 DIAGNOSIS — K8689 Other specified diseases of pancreas: Secondary | ICD-10-CM | POA: Diagnosis not present

## 2019-06-11 DIAGNOSIS — E039 Hypothyroidism, unspecified: Secondary | ICD-10-CM | POA: Diagnosis not present

## 2019-06-11 DIAGNOSIS — Z96641 Presence of right artificial hip joint: Secondary | ICD-10-CM | POA: Diagnosis not present

## 2019-06-11 DIAGNOSIS — Z792 Long term (current) use of antibiotics: Secondary | ICD-10-CM | POA: Diagnosis not present

## 2019-06-11 DIAGNOSIS — N184 Chronic kidney disease, stage 4 (severe): Secondary | ICD-10-CM | POA: Diagnosis not present

## 2019-06-11 DIAGNOSIS — Z794 Long term (current) use of insulin: Secondary | ICD-10-CM | POA: Diagnosis not present

## 2019-06-12 DIAGNOSIS — Z7982 Long term (current) use of aspirin: Secondary | ICD-10-CM | POA: Diagnosis not present

## 2019-06-12 DIAGNOSIS — Z884 Allergy status to anesthetic agent status: Secondary | ICD-10-CM | POA: Diagnosis not present

## 2019-06-12 DIAGNOSIS — E1065 Type 1 diabetes mellitus with hyperglycemia: Secondary | ICD-10-CM | POA: Diagnosis not present

## 2019-06-12 DIAGNOSIS — Z794 Long term (current) use of insulin: Secondary | ICD-10-CM | POA: Diagnosis not present

## 2019-06-12 DIAGNOSIS — G4733 Obstructive sleep apnea (adult) (pediatric): Secondary | ICD-10-CM | POA: Diagnosis not present

## 2019-06-12 DIAGNOSIS — E1022 Type 1 diabetes mellitus with diabetic chronic kidney disease: Secondary | ICD-10-CM | POA: Diagnosis not present

## 2019-06-12 DIAGNOSIS — Z79899 Other long term (current) drug therapy: Secondary | ICD-10-CM | POA: Diagnosis not present

## 2019-06-12 DIAGNOSIS — G9341 Metabolic encephalopathy: Secondary | ICD-10-CM | POA: Diagnosis not present

## 2019-06-12 DIAGNOSIS — R339 Retention of urine, unspecified: Secondary | ICD-10-CM | POA: Diagnosis not present

## 2019-06-12 DIAGNOSIS — E039 Hypothyroidism, unspecified: Secondary | ICD-10-CM | POA: Diagnosis not present

## 2019-06-12 DIAGNOSIS — T83518A Infection and inflammatory reaction due to other urinary catheter, initial encounter: Secondary | ICD-10-CM | POA: Diagnosis not present

## 2019-06-12 DIAGNOSIS — E785 Hyperlipidemia, unspecified: Secondary | ICD-10-CM | POA: Diagnosis not present

## 2019-06-12 DIAGNOSIS — K8689 Other specified diseases of pancreas: Secondary | ICD-10-CM | POA: Diagnosis not present

## 2019-06-12 DIAGNOSIS — D638 Anemia in other chronic diseases classified elsewhere: Secondary | ICD-10-CM | POA: Diagnosis not present

## 2019-06-12 DIAGNOSIS — N170 Acute kidney failure with tubular necrosis: Secondary | ICD-10-CM | POA: Diagnosis not present

## 2019-06-12 DIAGNOSIS — B9620 Unspecified Escherichia coli [E. coli] as the cause of diseases classified elsewhere: Secondary | ICD-10-CM | POA: Diagnosis not present

## 2019-06-12 DIAGNOSIS — Z96641 Presence of right artificial hip joint: Secondary | ICD-10-CM | POA: Diagnosis not present

## 2019-06-12 DIAGNOSIS — I10 Essential (primary) hypertension: Secondary | ICD-10-CM | POA: Diagnosis not present

## 2019-06-12 DIAGNOSIS — Z792 Long term (current) use of antibiotics: Secondary | ICD-10-CM | POA: Diagnosis not present

## 2019-06-12 DIAGNOSIS — Z9641 Presence of insulin pump (external) (internal): Secondary | ICD-10-CM | POA: Diagnosis not present

## 2019-06-12 DIAGNOSIS — I129 Hypertensive chronic kidney disease with stage 1 through stage 4 chronic kidney disease, or unspecified chronic kidney disease: Secondary | ICD-10-CM | POA: Diagnosis not present

## 2019-06-12 DIAGNOSIS — D631 Anemia in chronic kidney disease: Secondary | ICD-10-CM | POA: Diagnosis not present

## 2019-06-12 DIAGNOSIS — G9340 Encephalopathy, unspecified: Secondary | ICD-10-CM | POA: Diagnosis not present

## 2019-06-12 DIAGNOSIS — Z882 Allergy status to sulfonamides status: Secondary | ICD-10-CM | POA: Diagnosis not present

## 2019-06-12 DIAGNOSIS — N184 Chronic kidney disease, stage 4 (severe): Secondary | ICD-10-CM | POA: Diagnosis not present

## 2019-06-13 DIAGNOSIS — D638 Anemia in other chronic diseases classified elsewhere: Secondary | ICD-10-CM | POA: Diagnosis not present

## 2019-06-13 DIAGNOSIS — G9341 Metabolic encephalopathy: Secondary | ICD-10-CM | POA: Diagnosis not present

## 2019-06-13 DIAGNOSIS — N170 Acute kidney failure with tubular necrosis: Secondary | ICD-10-CM | POA: Diagnosis not present

## 2019-06-13 DIAGNOSIS — Z882 Allergy status to sulfonamides status: Secondary | ICD-10-CM | POA: Diagnosis not present

## 2019-06-13 DIAGNOSIS — G9340 Encephalopathy, unspecified: Secondary | ICD-10-CM | POA: Diagnosis not present

## 2019-06-13 DIAGNOSIS — E1065 Type 1 diabetes mellitus with hyperglycemia: Secondary | ICD-10-CM | POA: Diagnosis not present

## 2019-06-13 DIAGNOSIS — E039 Hypothyroidism, unspecified: Secondary | ICD-10-CM | POA: Diagnosis not present

## 2019-06-13 DIAGNOSIS — Z794 Long term (current) use of insulin: Secondary | ICD-10-CM | POA: Diagnosis not present

## 2019-06-13 DIAGNOSIS — K8689 Other specified diseases of pancreas: Secondary | ICD-10-CM | POA: Diagnosis not present

## 2019-06-13 DIAGNOSIS — Z9641 Presence of insulin pump (external) (internal): Secondary | ICD-10-CM | POA: Diagnosis not present

## 2019-06-13 DIAGNOSIS — I129 Hypertensive chronic kidney disease with stage 1 through stage 4 chronic kidney disease, or unspecified chronic kidney disease: Secondary | ICD-10-CM | POA: Diagnosis not present

## 2019-06-13 DIAGNOSIS — I10 Essential (primary) hypertension: Secondary | ICD-10-CM | POA: Diagnosis not present

## 2019-06-13 DIAGNOSIS — D631 Anemia in chronic kidney disease: Secondary | ICD-10-CM | POA: Diagnosis not present

## 2019-06-13 DIAGNOSIS — R339 Retention of urine, unspecified: Secondary | ICD-10-CM | POA: Diagnosis not present

## 2019-06-13 DIAGNOSIS — Z884 Allergy status to anesthetic agent status: Secondary | ICD-10-CM | POA: Diagnosis not present

## 2019-06-13 DIAGNOSIS — Z792 Long term (current) use of antibiotics: Secondary | ICD-10-CM | POA: Diagnosis not present

## 2019-06-13 DIAGNOSIS — Z96641 Presence of right artificial hip joint: Secondary | ICD-10-CM | POA: Diagnosis not present

## 2019-06-13 DIAGNOSIS — E1022 Type 1 diabetes mellitus with diabetic chronic kidney disease: Secondary | ICD-10-CM | POA: Diagnosis not present

## 2019-06-13 DIAGNOSIS — T83518A Infection and inflammatory reaction due to other urinary catheter, initial encounter: Secondary | ICD-10-CM | POA: Diagnosis not present

## 2019-06-13 DIAGNOSIS — B9620 Unspecified Escherichia coli [E. coli] as the cause of diseases classified elsewhere: Secondary | ICD-10-CM | POA: Diagnosis not present

## 2019-06-13 DIAGNOSIS — Z7982 Long term (current) use of aspirin: Secondary | ICD-10-CM | POA: Diagnosis not present

## 2019-06-13 DIAGNOSIS — E785 Hyperlipidemia, unspecified: Secondary | ICD-10-CM | POA: Diagnosis not present

## 2019-06-13 DIAGNOSIS — N184 Chronic kidney disease, stage 4 (severe): Secondary | ICD-10-CM | POA: Diagnosis not present

## 2019-06-13 DIAGNOSIS — G4733 Obstructive sleep apnea (adult) (pediatric): Secondary | ICD-10-CM | POA: Diagnosis not present

## 2019-06-13 DIAGNOSIS — Z79899 Other long term (current) drug therapy: Secondary | ICD-10-CM | POA: Diagnosis not present

## 2019-06-14 DIAGNOSIS — E1065 Type 1 diabetes mellitus with hyperglycemia: Secondary | ICD-10-CM | POA: Diagnosis not present

## 2019-06-14 DIAGNOSIS — Z79899 Other long term (current) drug therapy: Secondary | ICD-10-CM | POA: Diagnosis not present

## 2019-06-14 DIAGNOSIS — I129 Hypertensive chronic kidney disease with stage 1 through stage 4 chronic kidney disease, or unspecified chronic kidney disease: Secondary | ICD-10-CM | POA: Diagnosis not present

## 2019-06-14 DIAGNOSIS — D638 Anemia in other chronic diseases classified elsewhere: Secondary | ICD-10-CM | POA: Diagnosis not present

## 2019-06-14 DIAGNOSIS — T83518A Infection and inflammatory reaction due to other urinary catheter, initial encounter: Secondary | ICD-10-CM | POA: Diagnosis not present

## 2019-06-14 DIAGNOSIS — B9620 Unspecified Escherichia coli [E. coli] as the cause of diseases classified elsewhere: Secondary | ICD-10-CM | POA: Diagnosis not present

## 2019-06-14 DIAGNOSIS — Z96641 Presence of right artificial hip joint: Secondary | ICD-10-CM | POA: Diagnosis not present

## 2019-06-14 DIAGNOSIS — Z884 Allergy status to anesthetic agent status: Secondary | ICD-10-CM | POA: Diagnosis not present

## 2019-06-14 DIAGNOSIS — R339 Retention of urine, unspecified: Secondary | ICD-10-CM | POA: Diagnosis not present

## 2019-06-14 DIAGNOSIS — N184 Chronic kidney disease, stage 4 (severe): Secondary | ICD-10-CM | POA: Diagnosis not present

## 2019-06-14 DIAGNOSIS — G4733 Obstructive sleep apnea (adult) (pediatric): Secondary | ICD-10-CM | POA: Diagnosis not present

## 2019-06-14 DIAGNOSIS — E039 Hypothyroidism, unspecified: Secondary | ICD-10-CM | POA: Diagnosis not present

## 2019-06-14 DIAGNOSIS — Z882 Allergy status to sulfonamides status: Secondary | ICD-10-CM | POA: Diagnosis not present

## 2019-06-14 DIAGNOSIS — Z792 Long term (current) use of antibiotics: Secondary | ICD-10-CM | POA: Diagnosis not present

## 2019-06-14 DIAGNOSIS — G9341 Metabolic encephalopathy: Secondary | ICD-10-CM | POA: Diagnosis not present

## 2019-06-14 DIAGNOSIS — K8689 Other specified diseases of pancreas: Secondary | ICD-10-CM | POA: Diagnosis not present

## 2019-06-14 DIAGNOSIS — Z7982 Long term (current) use of aspirin: Secondary | ICD-10-CM | POA: Diagnosis not present

## 2019-06-14 DIAGNOSIS — Z9641 Presence of insulin pump (external) (internal): Secondary | ICD-10-CM | POA: Diagnosis not present

## 2019-06-14 DIAGNOSIS — E1022 Type 1 diabetes mellitus with diabetic chronic kidney disease: Secondary | ICD-10-CM | POA: Diagnosis not present

## 2019-06-14 DIAGNOSIS — G9340 Encephalopathy, unspecified: Secondary | ICD-10-CM | POA: Diagnosis not present

## 2019-06-14 DIAGNOSIS — Z794 Long term (current) use of insulin: Secondary | ICD-10-CM | POA: Diagnosis not present

## 2019-06-14 DIAGNOSIS — E785 Hyperlipidemia, unspecified: Secondary | ICD-10-CM | POA: Diagnosis not present

## 2019-06-14 DIAGNOSIS — D631 Anemia in chronic kidney disease: Secondary | ICD-10-CM | POA: Diagnosis not present

## 2019-06-14 DIAGNOSIS — N170 Acute kidney failure with tubular necrosis: Secondary | ICD-10-CM | POA: Diagnosis not present

## 2019-06-14 MED ORDER — GENERIC EXTERNAL MEDICATION
Status: DC
Start: ? — End: 2019-06-14

## 2019-06-14 MED ORDER — PANCRELIPASE (LIP-PROT-AMYL) 12000-38000 UNITS PO CPEP
24000.00 | ORAL_CAPSULE | ORAL | Status: DC
Start: 2019-06-14 — End: 2019-06-14

## 2019-06-14 MED ORDER — METOCLOPRAMIDE HCL 10 MG PO TABS
5.00 | ORAL_TABLET | ORAL | Status: DC
Start: 2019-06-14 — End: 2019-06-14

## 2019-06-14 MED ORDER — GENERIC EXTERNAL MEDICATION
Status: DC
Start: 2019-06-15 — End: 2019-06-14

## 2019-06-14 MED ORDER — FLUCONAZOLE 200 MG PO TABS
200.00 | ORAL_TABLET | ORAL | Status: DC
Start: 2019-06-15 — End: 2019-06-14

## 2019-06-14 MED ORDER — MIRTAZAPINE 15 MG PO TABS
15.00 | ORAL_TABLET | ORAL | Status: DC
Start: 2019-06-14 — End: 2019-06-14

## 2019-06-14 MED ORDER — INSULIN LISPRO 100 UNIT/ML ~~LOC~~ SOLN
0.00 | SUBCUTANEOUS | Status: DC
Start: 2019-06-14 — End: 2019-06-14

## 2019-06-14 MED ORDER — GENERIC EXTERNAL MEDICATION
100.00 | Status: DC
Start: 2019-06-15 — End: 2019-06-14

## 2019-06-14 MED ORDER — LORAZEPAM 0.5 MG PO TABS
0.50 | ORAL_TABLET | ORAL | Status: DC
Start: ? — End: 2019-06-14

## 2019-06-14 MED ORDER — LEVOTHYROXINE SODIUM 125 MCG PO TABS
125.00 | ORAL_TABLET | ORAL | Status: DC
Start: 2019-06-14 — End: 2019-06-14

## 2019-06-14 MED ORDER — GENERIC EXTERNAL MEDICATION
90.00 | Status: DC
Start: 2019-06-15 — End: 2019-06-14

## 2019-06-15 ENCOUNTER — Telehealth: Payer: Self-pay | Admitting: Family Medicine

## 2019-06-15 ENCOUNTER — Telehealth: Payer: Self-pay | Admitting: *Deleted

## 2019-06-15 MED ORDER — GENERIC EXTERNAL MEDICATION
Status: DC
Start: ? — End: 2019-06-15

## 2019-06-15 NOTE — Chronic Care Management (AMB) (Signed)
  Care Management   Note  06/15/2019 Name: Patrick Brown MRN: [id number] DOB: 03-04-1970  Patrick Brown is a 49 y.o. year old male who is a primary care patient of Dettinger, Fransisca Kaufmann, MD and is actively engaged with the care management team. I reached out to [name] by phone today to assist with re-scheduling an initial visit with the RN Case Manager  Follow up plan: Unsuccessful telephone outreach attempt made. A HIPPA compliant phone message was left for the patient providing contact information and requesting a return call.  The care management team will reach out to the patient again over the next 7 days.  If patient returns call to provider office, please advise to call [facility]  at [place], [place], [place], [place] [address] Direct Dial: [phone] [email or web address]: [email or web address]

## 2019-06-15 NOTE — Telephone Encounter (Signed)
Left Message for patient to call back to schedule hospital follow up appt

## 2019-06-15 NOTE — Telephone Encounter (Signed)
06/15/2019  FYI  Patrick Brown's mother took him home AMA from [facility] yesterday. He's not showing up as discharged on Patient Patrick Brown yet, but he will need a TOC call and hosp f/u appointment. He was scheduled to follow-up with [facility].  I will forward to [facility] Clinical staff for f/u.   Chong Sicilian, BSN, RN-BC [facility] Direct Dial: [phone]

## 2019-06-16 ENCOUNTER — Other Ambulatory Visit: Payer: Self-pay | Admitting: Family Medicine

## 2019-06-16 ENCOUNTER — Other Ambulatory Visit: Payer: Self-pay | Admitting: *Deleted

## 2019-06-16 ENCOUNTER — Other Ambulatory Visit: Payer: Medicare Other

## 2019-06-16 ENCOUNTER — Telehealth: Payer: Self-pay | Admitting: Family Medicine

## 2019-06-16 ENCOUNTER — Telehealth: Payer: Self-pay | Admitting: *Deleted

## 2019-06-16 DIAGNOSIS — R300 Dysuria: Secondary | ICD-10-CM

## 2019-06-16 DIAGNOSIS — Z09 Encounter for follow-up examination after completed treatment for conditions other than malignant neoplasm: Secondary | ICD-10-CM

## 2019-06-16 DIAGNOSIS — R710 Precipitous drop in hematocrit: Secondary | ICD-10-CM

## 2019-06-16 MED ORDER — GENERIC EXTERNAL MEDICATION
Status: DC
Start: ? — End: 2019-06-16

## 2019-06-16 NOTE — Telephone Encounter (Signed)
TRANSITIONAL CARE MANAGEMENT TELEPHONE OUTREACH NOTE   Contact Date: 06/16/2019 Contacted By: Zannie Cove, LPN   DISCHARGE INFORMATION Date of Discharge:06/14/2019 Discharge Facility: [facility]  Principal Discharge Diagnosis:encephalopathy, altered mental status, uti, acute renal failure  Outpatient Follow Up Recommendations (copied from discharge summary)  MY AFTER VISIT FOLLOW UP & SAFETY PLAN  1. Call my social support system -  Name: Patrick Brown Contact #: [phone]   2. Call my doctor, psychiatrist and/or therapist -  MD Name: Caryl Pina Contact #: [phone]  24/7 Crisis Call Center/Mobile Crisis Contact #: [phone]   3. Call the [facility] or [phone]  4. If I cannot reach anyone, I will call 911 for emergency assistance  5. Go to the nearest hospital emergency department  I understand and agree with my safety plan. I will go to my follow-up appointment. If I do not have a follow-up appointment scheduled or if I miss my planned follow-up appointment I will schedule a follow-up appointment and attend.    Patrick Brown is a male primary care patient of Dettinger, Fransisca Kaufmann, MD. An outgoing telephone call was made today and I spoke with his mother.  Mr. Ace condition(s) and treatment(s) were discussed. An opportunity to ask questions was provided and all were answered or forwarded as appropriate.    ACTIVITIES OF DAILY LIVING  Patrick Brown lives with their family and he can perform ADLs independently. his primary caregiver is himself and his mother. he is able to depend on his primary caregiver(s) for consistent help. Transportation to appointments, to pick up medications, and to run errands is a problem.  (Consider referral to [facility] if transportation or a consistent caregiver is a problem)   Fall Risk Fall Risk  01/07/2019 10/07/2018  Falls in the past year? 0 0  Number falls in  past yr: - -  Injury with Fall? - -  Follow up - -    low [place] Modifications/Assistive Devices Wheelchair: No Cane: No Ramp: No Bedside Toilet: No Hospital Bed:  No Other:    Patrick Brown he is not receiving home health n/a services.     MEDICATION RECONCILIATION  Mr. Baskette has been able to pick-up all prescribed discharge medications from the pharmacy.   A post discharge medication reconciliation was performed and the complete medication list was reviewed with the patient/caregiver and is current as of 06/16/2019. Changes highlighted below.  Discontinued Medications DISCONTINUED medications   diclofenac sodium (VOLTAREN) 1% GEL   doxycycline hyclate (VIBRAMYCIN) 100 mg capsule   furosemide (LASIX) 80 mg tablet   losartan potassium (COZAAR) 100 mg tablet    Current Medication List Allergies as of 06/16/2019      Reactions   Sulfa Antibiotics Other (See Comments)   High potassium   Ramipril Cough   Versed [midazolam] Other (See Comments)   "I don't wake up very good or clear it out of my system"      Medication List       Accurate as of Jun 16, 2019  9:51 AM. If you have any questions, ask your nurse or doctor.        acetaminophen 500 MG tablet Commonly known as: TYLENOL Take 1,000 mg by mouth 2 (two) times daily.   acyclovir 400 MG tablet Commonly known as: ZOVIRAX TAKE 1 TABLET (400 MG TOTAL) BY MOUTH 2 (TWO) TIMES DAILY.   AgaMatrix Ultra-Thin Lancets Misc Test BS  4 times a day and prn Dx E10.65   amLODipine 10 MG tablet Commonly known as: NORVASC Take 1 tablet by mouth daily.   aspirin 81 MG chewable tablet Chew 81 mg by mouth every morning.   cetirizine 10 MG tablet Commonly known as: ZYRTEC TAKE 1 TABLET BY MOUTH EVERY DAY   cyclobenzaprine 10 MG tablet Commonly known as: FLEXERIL Take 1 tablet (10 mg total) by mouth 3 (three) times daily as needed for muscle spasms.   diclofenac sodium 1 % Gel Commonly known  as: VOLTAREN Apply 2 g topically 4 (four) times daily.   diphenoxylate-atropine 2.5-0.025 MG tablet Commonly known as: Lomotil Take 1 tablet by mouth 4 (four) times daily as needed for diarrhea or loose stools.   DULoxetine 60 MG capsule Commonly known as: CYMBALTA TAKE 1 CAPSULE (60 MG TOTAL) BY MOUTH 2 (TWO) TIMES DAILY AS DIRECTED   esomeprazole 40 MG capsule Commonly known as: NEXIUM TAKE 1 CAPSULE BY MOUTH EVERY DAY   febuxostat 40 MG tablet Commonly known as: ULORIC TAKE 1 TABLET BY MOUTH EVERY DAY   ferrous sulfate 325 (65 FE) MG tablet Take 650 mg by mouth daily with breakfast.   fludrocortisone 0.1 MG tablet Commonly known as: FLORINEF Take 0.1 mg by mouth daily.   fluticasone 50 MCG/ACT nasal spray Commonly known as: FLONASE SPRAY 2 SPRAYS INTO EACH NOSTRIL EVERY DAY   furosemide 40 MG tablet Commonly known as: LASIX Take 20 mg by mouth 2 (two) times daily.   GLUCOSAMINE 1500 COMPLEX PO Take 1 tablet by mouth 2 (two) times daily.   glucose blood test strip Commonly known as: OneTouch Verio TEST BLOOD SUGAR 4 TIMES DAILY AND AS NEEDED   hyoscyamine 0.125 MG tablet Commonly known as: LEVSIN TAKE 1 TABLET (0.125 MG TOTAL) BY MOUTH EVERY 4 (FOUR) HOURS AS NEEDED.   insulin lispro 100 UNIT/ML injection Commonly known as: HumaLOG USE 42 UNITS TO 120 UNITS PER PUMP DAILY AS DIRECTED   ipratropium 0.03 % nasal spray Commonly known as: ATROVENT USE 2 SPRAY IN EACH NOSTRIL 2-3 TIMES DAILY   levothyroxine 137 MCG tablet Commonly known as: SYNTHROID Take 1 tablet (137 mcg total) by mouth daily before breakfast.   LORazepam 0.5 MG tablet Commonly known as: ATIVAN Take 1 tablet (0.5 mg total) by mouth daily as needed.   losartan 100 MG tablet Commonly known as: COZAAR Take 100 mg by mouth daily.   meclizine 12.5 MG tablet Commonly known as: ANTIVERT Take 1 tablet (12.5 mg total) by mouth 3 (three) times daily as needed for dizziness.   metoCLOPramide 5  MG tablet Commonly known as: REGLAN TAKE 1 TABLET BY MOUTH 3 TIMES A DAY BEFORE MEALS   mirtazapine 30 MG tablet Commonly known as: REMERON Take 1 tablet (30 mg total) by mouth at bedtime. Needs to be seen for further refills.   niacin 1000 MG CR tablet Commonly known as: NIASPAN TAKE 1 TABLET (1,000 MG TOTAL) BY MOUTH AT BEDTIME. What changed: See the new instructions.   ondansetron 4 MG tablet Commonly known as: ZOFRAN Take 1 tablet (4 mg total) by mouth every 8 (eight) hours as needed for nausea.   OVER THE COUNTER MEDICATION Take 1 capsule by mouth daily. [name]- probiotic daily   polyethylene glycol powder 17 GM/SCOOP powder Commonly known as: GLYCOLAX/MIRALAX Take 17 g by mouth daily as needed.   promethazine 25 MG suppository Commonly known as: Phenergan Place 1 suppository (25 mg total) rectally every 6 (six) hours  as needed for nausea or vomiting.   simvastatin 40 MG tablet Commonly known as: ZOCOR TAKE 1 TABLET BY MOUTH EVERYDAY AT BEDTIME   Testosterone 20.25 MG/ACT (1.62%) Gel APPLY 3 PUMPS DAILY AS DIRECTED   Triumeq 600-50-300 MG tablet Generic drug: abacavir-dolutegravir-lamiVUDine Take 1 tablet by mouth daily.   Vascepa 1 g capsule Generic drug: icosapent Ethyl TAKE 2 CAPSULES (2 G TOTAL) BY MOUTH 2 (TWO) TIMES DAILY.        PATIENT EDUCATION & FOLLOW-UP PLAN  An appointment for Transitional Care Management is scheduled with Dettinger, Fransisca Kaufmann, MD on 06/24/19 at 140pm.  Take all medications as prescribed  Contact our office by calling [phone] if you have any questions or concerns

## 2019-06-16 NOTE — Telephone Encounter (Signed)
Lab appt made for this afternoon to drop of urine

## 2019-06-16 NOTE — Telephone Encounter (Signed)
Patient has been off abx since Sunday at Hospital it was by IV orders placed she will be in this afternoon to drop off urine.

## 2019-06-16 NOTE — Telephone Encounter (Signed)
Appointment scheduled 06/24/2019

## 2019-06-16 NOTE — Telephone Encounter (Signed)
Only if he is already finished with the antibiotic, he has to be off the antibiotic for at least 3 or 4 days before he brings in the urine and urine culture but if he is already done with that then they can go ahead and order it.

## 2019-06-17 ENCOUNTER — Other Ambulatory Visit: Payer: Medicare Other

## 2019-06-17 ENCOUNTER — Other Ambulatory Visit: Payer: Self-pay

## 2019-06-17 DIAGNOSIS — Z09 Encounter for follow-up examination after completed treatment for conditions other than malignant neoplasm: Secondary | ICD-10-CM | POA: Diagnosis not present

## 2019-06-17 DIAGNOSIS — N183 Chronic kidney disease, stage 3 unspecified: Secondary | ICD-10-CM | POA: Diagnosis not present

## 2019-06-17 LAB — URINALYSIS, COMPLETE
Bilirubin, UA: NEGATIVE
Glucose, UA: NEGATIVE
Ketones, UA: NEGATIVE
Leukocytes,UA: NEGATIVE
Nitrite, UA: NEGATIVE
Specific Gravity, UA: 1.02 (ref 1.005–1.030)
Urobilinogen, Ur: 0.2 mg/dL (ref 0.2–1.0)
pH, UA: 5 (ref 5.0–7.5)

## 2019-06-17 LAB — MICROSCOPIC EXAMINATION
Epithelial Cells (non renal): NONE SEEN /hpf (ref 0–10)
Renal Epithel, UA: NONE SEEN /hpf

## 2019-06-18 ENCOUNTER — Other Ambulatory Visit: Payer: Medicare Other

## 2019-06-18 ENCOUNTER — Other Ambulatory Visit: Payer: Self-pay | Admitting: Family

## 2019-06-18 DIAGNOSIS — R7989 Other specified abnormal findings of blood chemistry: Secondary | ICD-10-CM

## 2019-06-18 DIAGNOSIS — Z09 Encounter for follow-up examination after completed treatment for conditions other than malignant neoplasm: Secondary | ICD-10-CM

## 2019-06-18 DIAGNOSIS — R710 Precipitous drop in hematocrit: Secondary | ICD-10-CM

## 2019-06-18 DIAGNOSIS — R6889 Other general symptoms and signs: Secondary | ICD-10-CM | POA: Diagnosis not present

## 2019-06-18 LAB — URINE CULTURE: Organism ID, Bacteria: NO GROWTH

## 2019-06-18 LAB — HEMOGLOBIN, FINGERSTICK: Hemoglobin: 8.1 g/dL — ABNORMAL LOW (ref 12.6–17.7)

## 2019-06-19 ENCOUNTER — Other Ambulatory Visit: Payer: Self-pay | Admitting: Family

## 2019-06-19 LAB — ANEMIA PROFILE B
Basophils Absolute: 0.1 10*3/uL (ref 0.0–0.2)
Basos: 1 %
EOS (ABSOLUTE): 0.6 10*3/uL — ABNORMAL HIGH (ref 0.0–0.4)
Eos: 10 %
Ferritin: 922 ng/mL — ABNORMAL HIGH (ref 30–400)
Folate: 2.3 ng/mL — ABNORMAL LOW (ref >3.0)
Hematocrit: 25.3 % — ABNORMAL LOW (ref 37.5–51.0)
Hemoglobin: 8.3 g/dL — CL (ref 13.0–17.7)
Immature Grans (Abs): 0 10*3/uL (ref 0.0–0.1)
Immature Granulocytes: 1 %
Iron Saturation: 50 % (ref 15–55)
Iron: 105 ug/dL (ref 38–169)
Lymphocytes Absolute: 1.5 10*3/uL (ref 0.7–3.1)
Lymphs: 25 %
MCH: 34 pg — ABNORMAL HIGH (ref 26.6–33.0)
MCHC: 32.8 g/dL (ref 31.5–35.7)
MCV: 104 fL — ABNORMAL HIGH (ref 79–97)
Monocytes Absolute: 0.5 10*3/uL (ref 0.1–0.9)
Monocytes: 8 %
Neutrophils Absolute: 3.3 10*3/uL (ref 1.4–7.0)
Neutrophils: 55 %
Platelets: 250 10*3/uL (ref 150–450)
RBC: 2.44 x10E6/uL — CL (ref 4.14–5.80)
RDW: 14.4 % (ref 11.6–15.4)
Retic Ct Pct: 2.9 % — ABNORMAL HIGH (ref 0.6–2.6)
Total Iron Binding Capacity: 209 ug/dL — ABNORMAL LOW (ref 250–450)
UIBC: 104 ug/dL — ABNORMAL LOW (ref 111–343)
Vitamin B-12: 436 pg/mL (ref 232–1245)
WBC: 5.9 10*3/uL (ref 3.4–10.8)

## 2019-06-19 LAB — CMP14+EGFR
ALT: 39 IU/L (ref 0–44)
AST: 35 IU/L (ref 0–40)
Albumin/Globulin Ratio: 1.5 (ref 1.2–2.2)
Albumin: 3.4 g/dL — ABNORMAL LOW (ref 4.0–5.0)
Alkaline Phosphatase: 93 IU/L (ref 39–117)
BUN/Creatinine Ratio: 9 (ref 9–20)
BUN: 46 mg/dL — ABNORMAL HIGH (ref 6–24)
Bilirubin Total: <0.2 mg/dL (ref 0.0–1.2)
CO2: 18 mmol/L — ABNORMAL LOW (ref 20–29)
Calcium: 8.4 mg/dL — ABNORMAL LOW (ref 8.7–10.2)
Chloride: 109 mmol/L — ABNORMAL HIGH (ref 96–106)
Creatinine, Ser: 4.96 mg/dL (ref 0.76–1.27)
GFR calc Af Amer: 15 mL/min/{1.73_m2} — ABNORMAL LOW (ref >59)
GFR calc non Af Amer: 13 mL/min/{1.73_m2} — ABNORMAL LOW (ref >59)
Globulin, Total: 2.2 g/dL (ref 1.5–4.5)
Glucose: 79 mg/dL (ref 65–99)
Potassium: 4.2 mmol/L (ref 3.5–5.2)
Sodium: 144 mmol/L (ref 134–144)
Total Protein: 5.6 g/dL — ABNORMAL LOW (ref 6.0–8.5)

## 2019-06-19 LAB — TSH: TSH: 9.9 u[IU]/mL — ABNORMAL HIGH (ref 0.450–4.500)

## 2019-06-19 NOTE — Progress Notes (Signed)
Called and spoke with mother about patient critical lab work, these are stable from his hospital stay and has follow up appt with nephrologists this week.

## 2019-06-22 DIAGNOSIS — N185 Chronic kidney disease, stage 5: Secondary | ICD-10-CM | POA: Diagnosis not present

## 2019-06-22 DIAGNOSIS — D631 Anemia in chronic kidney disease: Secondary | ICD-10-CM | POA: Diagnosis not present

## 2019-06-23 NOTE — Chronic Care Management (AMB) (Signed)
  Care Management   Note  06/23/2019 Name: Patrick Brown MRN: [id number] DOB: [birth year]  [name] is a 50 y.o. year old male who is a primary care patient of Dettinger, Fransisca Kaufmann, MD and is actively engaged with the care management team. I reached out to [name] by phone today to assist with re-scheduling an initial visit with the RN Case Manager  Follow up plan: Unsuccessful telephone outreach attempt made. A HIPPA compliant phone message was left with his mother [name] for the patient providing contact information and requesting a return call.  The care management team will reach out to the patient again over the next 7 days.  If patient returns call to provider office, please advise to call [facility]  at [place], [place], [place], [place] [address] Direct Dial: [phone] [email or web address]: [email or web address]

## 2019-06-24 ENCOUNTER — Ambulatory Visit (INDEPENDENT_AMBULATORY_CARE_PROVIDER_SITE_OTHER): Payer: Medicare Other | Admitting: Family Medicine

## 2019-06-24 ENCOUNTER — Encounter: Payer: Self-pay | Admitting: Family Medicine

## 2019-06-24 ENCOUNTER — Other Ambulatory Visit: Payer: Self-pay

## 2019-06-24 VITALS — BP 130/72 | HR 84 | Temp 97.6°F | Ht 70.0 in | Wt 199.4 lb

## 2019-06-24 DIAGNOSIS — R630 Anorexia: Secondary | ICD-10-CM

## 2019-06-24 DIAGNOSIS — R609 Edema, unspecified: Secondary | ICD-10-CM | POA: Diagnosis not present

## 2019-06-24 DIAGNOSIS — N3000 Acute cystitis without hematuria: Secondary | ICD-10-CM | POA: Diagnosis not present

## 2019-06-24 DIAGNOSIS — R634 Abnormal weight loss: Secondary | ICD-10-CM

## 2019-06-24 DIAGNOSIS — N186 End stage renal disease: Secondary | ICD-10-CM

## 2019-06-24 DIAGNOSIS — D649 Anemia, unspecified: Secondary | ICD-10-CM | POA: Diagnosis not present

## 2019-06-24 MED ORDER — DULOXETINE HCL 60 MG PO CPEP: 60.0000 mg | ORAL_CAPSULE | Freq: Every day | ORAL | 3 refills | Status: DC

## 2019-06-24 MED ORDER — DULOXETINE HCL 30 MG PO CPEP
30.0000 mg | ORAL_CAPSULE | Freq: Every day | ORAL | 3 refills | Status: DC
Start: 2019-06-24 — End: 2020-06-12

## 2019-06-24 MED ORDER — MIRTAZAPINE 30 MG PO TABS: 15.0000 mg | ORAL_TABLET | Freq: Every day | ORAL | 3 refills | Status: DC

## 2019-06-24 NOTE — Progress Notes (Signed)
BP 130/72   Pulse 84   Temp 97.6 F (36.4 C) (Temporal)   Ht 5\' 10"  (1.778 m)   Wt 199 lb 6 oz (90.4 kg)   BMI 28.61 kg/m    Subjective:   Patient ID: Patrick Brown, male    DOB: 01-17-1971, 49 y.o.   MRN: [id number]  HPI: Patrick Brown is a 49 y.o. male presenting on 06/24/2019 for Transitions Of Care   HPI  TRANSITIONAL CARE MANAGEMENT TELEPHONE OUTREACH NOTE   Contact Date: 06/16/2019 Contacted By: Zannie Cove, LPN   DISCHARGE INFORMATION Date of Discharge:06/14/2019 Discharge Facility: [facility]  Principal Discharge Diagnosis:encephalopathy, altered mental status, uti, acute renal failure  Patient was in on 06/02/19 and released on 06/07/19 and then went back on 06/07/19 and discharged on 06/14/19. He was treated and diagnosed with UTI and encephalopathy.  Now he is having no confusion today but did have some yesterday when kidney function was up, he has nephrology and is thinking renal transplant or dialysis.  He is going to June 1st.   He has developed a cough. He thinks he has wheezing since he hospital and has some sinus congestion.  Dry cough. He denies shortness of breath.   The cough started in the hospital and now is worsening.  He is using cough syrup.  Patient see nephrology on June 15th and had blood work with them in care everywhere 2 days ago.   Relevant past medical, surgical, family and social history reviewed and updated as indicated. Interim medical history since our last visit reviewed. Allergies and medications reviewed and updated.  Review of Systems  Constitutional: Positive for appetite change and unexpected weight change. Negative for chills and fever.  Eyes: Negative for visual disturbance.  Respiratory: Positive for cough. Negative for shortness of breath and wheezing.   Cardiovascular: Negative for chest pain and leg swelling.  Musculoskeletal: Negative for back pain and gait problem.  Skin: Negative for rash.    Neurological: Negative for dizziness and light-headedness.  All other systems reviewed and are negative.   Per HPI unless specifically indicated above   Allergies as of 06/24/2019      Reactions   Sulfa Antibiotics Other (See Comments)   High potassium   Ramipril Cough   Versed [midazolam] Other (See Comments)   "I don't wake up very good or clear it out of my system"      Medication List       Accurate as of Jun 24, 2019  1:58 PM. If you have any questions, ask your nurse or doctor.        acetaminophen 500 MG tablet Commonly known as: TYLENOL Take 1,000 mg by mouth 2 (two) times daily.   acyclovir 400 MG tablet Commonly known as: ZOVIRAX TAKE 1 TABLET (400 MG TOTAL) BY MOUTH 2 (TWO) TIMES DAILY.   AgaMatrix Ultra-Thin Lancets Misc Test BS 4 times a day and prn Dx E10.65   amLODipine 10 MG tablet Commonly known as: NORVASC Take 1 tablet by mouth daily.   aspirin 81 MG chewable tablet Chew 81 mg by mouth every morning.   cetirizine 10 MG tablet Commonly known as: ZYRTEC TAKE 1 TABLET BY MOUTH EVERY DAY   cyclobenzaprine 10 MG tablet Commonly known as: FLEXERIL Take 1 tablet (10 mg total) by mouth 3 (three) times daily as needed for muscle spasms.   diclofenac sodium 1 % Gel Commonly known as: VOLTAREN Apply 2 g topically 4 (four) times daily.  diphenoxylate-atropine 2.5-0.025 MG tablet Commonly known as: Lomotil Take 1 tablet by mouth 4 (four) times daily as needed for diarrhea or loose stools.   DULoxetine 60 MG capsule Commonly known as: CYMBALTA TAKE 1 CAPSULE (60 MG TOTAL) BY MOUTH 2 (TWO) TIMES DAILY AS DIRECTED   esomeprazole 40 MG capsule Commonly known as: NEXIUM TAKE 1 CAPSULE BY MOUTH EVERY DAY   febuxostat 40 MG tablet Commonly known as: ULORIC TAKE 1 TABLET BY MOUTH EVERY DAY   ferrous sulfate 325 (65 FE) MG tablet Take 650 mg by mouth daily with breakfast.   fludrocortisone 0.1 MG tablet Commonly known as: FLORINEF Take 0.1 mg  by mouth daily.   fluticasone 50 MCG/ACT nasal spray Commonly known as: FLONASE SPRAY 2 SPRAYS INTO EACH NOSTRIL EVERY DAY   furosemide 40 MG tablet Commonly known as: LASIX Take 20 mg by mouth 2 (two) times daily.   GLUCOSAMINE 1500 COMPLEX PO Take 1 tablet by mouth 2 (two) times daily.   glucose blood test strip Commonly known as: OneTouch Verio TEST BLOOD SUGAR 4 TIMES DAILY AND AS NEEDED   hyoscyamine 0.125 MG tablet Commonly known as: LEVSIN TAKE 1 TABLET (0.125 MG TOTAL) BY MOUTH EVERY 4 (FOUR) HOURS AS NEEDED.   insulin lispro 100 UNIT/ML injection Commonly known as: HumaLOG USE 42 UNITS TO 120 UNITS PER PUMP DAILY AS DIRECTED   ipratropium 0.03 % nasal spray Commonly known as: ATROVENT USE 2 SPRAY IN EACH NOSTRIL 2-3 TIMES DAILY   levothyroxine 125 MCG tablet Commonly known as: SYNTHROID Take 125 mcg by mouth daily. What changed: Another medication with the same name was removed. Continue taking this medication, and follow the directions you see here. Changed by: Worthy Rancher, MD   lipase/protease/amylase 12000-38000 units Cpep capsule Commonly known as: CREON Take 2 capsules prior to meals and 1 capsule prior to snacks   LORazepam 0.5 MG tablet Commonly known as: ATIVAN Take 1 tablet (0.5 mg total) by mouth daily as needed.   losartan 100 MG tablet Commonly known as: COZAAR Take 100 mg by mouth daily.   meclizine 12.5 MG tablet Commonly known as: ANTIVERT Take 1 tablet (12.5 mg total) by mouth 3 (three) times daily as needed for dizziness.   metoCLOPramide 5 MG tablet Commonly known as: REGLAN TAKE 1 TABLET BY MOUTH 3 TIMES A DAY BEFORE MEALS   mirtazapine 30 MG tablet Commonly known as: REMERON Take 1 tablet (30 mg total) by mouth at bedtime. Needs to be seen for further refills.   niacin 1000 MG CR tablet Commonly known as: NIASPAN TAKE 1 TABLET (1,000 MG TOTAL) BY MOUTH AT BEDTIME. What changed: See the new instructions.   ondansetron  4 MG tablet Commonly known as: ZOFRAN Take 1 tablet (4 mg total) by mouth every 8 (eight) hours as needed for nausea.   OVER THE COUNTER MEDICATION Take 1 capsule by mouth daily. [name]- probiotic daily   polyethylene glycol powder 17 GM/SCOOP powder Commonly known as: GLYCOLAX/MIRALAX Take 17 g by mouth daily as needed.   promethazine 25 MG suppository Commonly known as: Phenergan Place 1 suppository (25 mg total) rectally every 6 (six) hours as needed for nausea or vomiting.   simvastatin 40 MG tablet Commonly known as: ZOCOR TAKE 1 TABLET BY MOUTH EVERYDAY AT BEDTIME   tamsulosin 0.4 MG Caps capsule Commonly known as: FLOMAX Take 0.4 mg by mouth daily.   Testosterone 20.25 MG/ACT (1.62%) Gel APPLY 3 PUMPS DAILY AS DIRECTED   Triumeq 600-50-300  MG tablet Generic drug: abacavir-dolutegravir-lamiVUDine Take 1 tablet by mouth daily.   Vascepa 1 g capsule Generic drug: icosapent Ethyl TAKE 2 CAPSULES (2 G TOTAL) BY MOUTH 2 (TWO) TIMES DAILY.        Objective:   BP 130/72   Pulse 84   Temp 97.6 F (36.4 C) (Temporal)   Ht 5\' 10"  (1.778 m)   Wt 199 lb 6 oz (90.4 kg)   BMI 28.61 kg/m   Wt Readings from Last 3 Encounters:  06/24/19 199 lb 6 oz (90.4 kg)  01/07/19 191 lb (86.6 kg)  11/24/18 201 lb (91.2 kg)    Physical Exam Vitals and nursing note reviewed.  Constitutional:      General: He is not in acute distress.    Appearance: He is well-developed. He is not diaphoretic.  Eyes:     General: No scleral icterus.    Conjunctiva/sclera: Conjunctivae normal.  Neck:     Thyroid: No thyromegaly.  Cardiovascular:     Rate and Rhythm: Normal rate and regular rhythm.     Heart sounds: Normal heart sounds. No murmur.  Pulmonary:     Effort: Pulmonary effort is normal. No respiratory distress.     Breath sounds: Normal breath sounds. No stridor. No wheezing, rhonchi or rales.  Musculoskeletal:        General: Swelling (Trace bilateral lower extremity edema)  present. Normal range of motion.     Cervical back: Neck supple.  Lymphadenopathy:     Cervical: No cervical adenopathy.  Skin:    General: Skin is warm and dry.     Findings: No rash.  Neurological:     Mental Status: He is alert and oriented to person, place, and time.     Coordination: Coordination normal.  Psychiatric:        Behavior: Behavior normal.       Assessment & Plan:   Problem List Items Addressed This Visit    None    Visit Diagnoses    Anemia, unspecified type    -  Primary   End stage renal disease ([place])       Acute cystitis without hematuria       Peripheral edema       Loss of appetite       Relevant Medications   mirtazapine (REMERON) 30 MG tablet   Weight loss, abnormal       Relevant Medications   mirtazapine (REMERON) 30 MG tablet      Will start Remeron for appetite to see if it helps with his weight loss and help with sleeping.  He will continue with nephrologist for renal disease, he says that the confusion and issues with urination are resolved. Follow up plan: Return in about 4 weeks (around 07/22/2019), or if symptoms worsen or fail to improve.  Counseling provided for all of the vaccine components No orders of the defined types were placed in this encounter.   Caryl Pina, MD [facility] 06/24/2019, 1:58 PM

## 2019-06-28 ENCOUNTER — Encounter: Payer: Self-pay | Admitting: Family Medicine

## 2019-06-29 DIAGNOSIS — E109 Type 1 diabetes mellitus without complications: Secondary | ICD-10-CM | POA: Diagnosis not present

## 2019-06-29 MED ORDER — AMOXICILLIN-POT CLAVULANATE 875-125 MG PO TABS
1.0000 | ORAL_TABLET | Freq: Two times a day (BID) | ORAL | 0 refills | Status: DC
Start: 2019-06-29 — End: 2019-07-08

## 2019-07-01 NOTE — Chronic Care Management (AMB) (Signed)
  Care Management   Note  07/01/2019 Name: Draken Farrior MRN: [id number] DOB: [birth year]  Patrick Brown is a 49 y.o. year old male who is a primary care patient of Dettinger, Fransisca Kaufmann, MD and is actively engaged with the care management team. I reached out to [name] by phone today to assist with re-scheduling an initial visit with the Licensed Clinical Social Worker  Follow up plan: Unable to make contact on outreach attempts x 3. PCP Dr. Warrick Parisian notified via routed documentation in medical record.   Noreene Larsson, [place], [place], [place] [address] Direct Dial: [phone] [email or web address] Website: [email or web address]

## 2019-07-05 ENCOUNTER — Encounter: Payer: Self-pay | Admitting: Family Medicine

## 2019-07-06 ENCOUNTER — Other Ambulatory Visit: Payer: Self-pay

## 2019-07-06 ENCOUNTER — Other Ambulatory Visit: Payer: Medicare Other

## 2019-07-06 DIAGNOSIS — E1021 Type 1 diabetes mellitus with diabetic nephropathy: Secondary | ICD-10-CM | POA: Diagnosis not present

## 2019-07-06 DIAGNOSIS — N1831 Chronic kidney disease, stage 3a: Secondary | ICD-10-CM | POA: Diagnosis not present

## 2019-07-07 LAB — CBC WITH DIFFERENTIAL/PLATELET
Basophils Absolute: 0.1 10*3/uL (ref 0.0–0.2)
Basos: 1 %
EOS (ABSOLUTE): 1.1 10*3/uL — ABNORMAL HIGH (ref 0.0–0.4)
Eos: 12 %
Hematocrit: 24.6 % — ABNORMAL LOW (ref 37.5–51.0)
Hemoglobin: 8.2 g/dL — CL (ref 13.0–17.7)
Immature Grans (Abs): 0 10*3/uL (ref 0.0–0.1)
Immature Granulocytes: 0 %
Lymphocytes Absolute: 2.1 10*3/uL (ref 0.7–3.1)
Lymphs: 24 %
MCH: 33.5 pg — ABNORMAL HIGH (ref 26.6–33.0)
MCHC: 33.3 g/dL (ref 31.5–35.7)
MCV: 100 fL — ABNORMAL HIGH (ref 79–97)
Monocytes Absolute: 0.6 10*3/uL (ref 0.1–0.9)
Monocytes: 7 %
Neutrophils Absolute: 4.8 10*3/uL (ref 1.4–7.0)
Neutrophils: 56 %
Platelets: 221 10*3/uL (ref 150–450)
RBC: 2.45 x10E6/uL — CL (ref 4.14–5.80)
RDW: 15 % (ref 11.6–15.4)
WBC: 8.7 10*3/uL (ref 3.4–10.8)

## 2019-07-07 LAB — CMP14+EGFR
ALT: 14 IU/L (ref 0–44)
AST: 18 IU/L (ref 0–40)
Albumin/Globulin Ratio: 1.9 (ref 1.2–2.2)
Albumin: 3.5 g/dL — ABNORMAL LOW (ref 4.0–5.0)
Alkaline Phosphatase: 113 IU/L (ref 48–121)
BUN/Creatinine Ratio: 11 (ref 9–20)
BUN: 63 mg/dL — ABNORMAL HIGH (ref 6–24)
Bilirubin Total: 0.2 mg/dL (ref 0.0–1.2)
CO2: 17 mmol/L — ABNORMAL LOW (ref 20–29)
Calcium: 8.4 mg/dL — ABNORMAL LOW (ref 8.7–10.2)
Chloride: 103 mmol/L (ref 96–106)
Creatinine, Ser: 5.65 mg/dL (ref 0.76–1.27)
GFR calc Af Amer: 12 mL/min/{1.73_m2} — ABNORMAL LOW (ref >59)
GFR calc non Af Amer: 11 mL/min/{1.73_m2} — ABNORMAL LOW (ref >59)
Globulin, Total: 1.8 g/dL (ref 1.5–4.5)
Glucose: 89 mg/dL (ref 65–99)
Potassium: 4.2 mmol/L (ref 3.5–5.2)
Sodium: 137 mmol/L (ref 134–144)
Total Protein: 5.3 g/dL — ABNORMAL LOW (ref 6.0–8.5)

## 2019-07-07 LAB — LIPID PANEL
Chol/HDL Ratio: 2 ratio (ref 0.0–5.0)
Cholesterol, Total: 130 mg/dL (ref 100–199)
HDL: 66 mg/dL (ref >39)
LDL Chol Calc (NIH): 51 mg/dL (ref 0–99)
Triglycerides: 60 mg/dL (ref 0–149)
VLDL Cholesterol Cal: 13 mg/dL (ref 5–40)

## 2019-07-08 ENCOUNTER — Telehealth: Payer: Self-pay | Admitting: Family Medicine

## 2019-07-08 ENCOUNTER — Other Ambulatory Visit: Payer: Self-pay

## 2019-07-08 ENCOUNTER — Encounter: Payer: Self-pay | Admitting: Family Medicine

## 2019-07-08 ENCOUNTER — Ambulatory Visit (INDEPENDENT_AMBULATORY_CARE_PROVIDER_SITE_OTHER): Payer: Medicare Other | Admitting: Family Medicine

## 2019-07-08 ENCOUNTER — Ambulatory Visit (INDEPENDENT_AMBULATORY_CARE_PROVIDER_SITE_OTHER): Payer: Medicare Other

## 2019-07-08 VITALS — BP 139/82 | HR 84 | Temp 98.0°F | Ht 70.0 in | Wt 203.0 lb

## 2019-07-08 DIAGNOSIS — F419 Anxiety disorder, unspecified: Secondary | ICD-10-CM | POA: Diagnosis not present

## 2019-07-08 DIAGNOSIS — E039 Hypothyroidism, unspecified: Secondary | ICD-10-CM | POA: Diagnosis not present

## 2019-07-08 DIAGNOSIS — J301 Allergic rhinitis due to pollen: Secondary | ICD-10-CM

## 2019-07-08 DIAGNOSIS — B20 Human immunodeficiency virus [HIV] disease: Secondary | ICD-10-CM | POA: Diagnosis not present

## 2019-07-08 DIAGNOSIS — E1022 Type 1 diabetes mellitus with diabetic chronic kidney disease: Secondary | ICD-10-CM

## 2019-07-08 DIAGNOSIS — R05 Cough: Secondary | ICD-10-CM | POA: Diagnosis not present

## 2019-07-08 DIAGNOSIS — N1832 Chronic kidney disease, stage 3b: Secondary | ICD-10-CM

## 2019-07-08 DIAGNOSIS — E1021 Type 1 diabetes mellitus with diabetic nephropathy: Secondary | ICD-10-CM | POA: Diagnosis not present

## 2019-07-08 DIAGNOSIS — Z79891 Long term (current) use of opiate analgesic: Secondary | ICD-10-CM | POA: Diagnosis not present

## 2019-07-08 MED ORDER — LORAZEPAM 0.5 MG PO TABS: 0.5000 mg | ORAL_TABLET | Freq: Every day | ORAL | 5 refills | Status: DC | PRN

## 2019-07-08 MED ORDER — AMOXICILLIN-POT CLAVULANATE 875-125 MG PO TABS: 1.0000 | ORAL_TABLET | Freq: Two times a day (BID) | ORAL | 0 refills | Status: DC

## 2019-07-08 MED ORDER — LEVOTHYROXINE SODIUM 125 MCG PO TABS: 125.0000 ug | ORAL_TABLET | Freq: Every day | ORAL | 3 refills | Status: DC

## 2019-07-08 MED ORDER — FEXOFENADINE HCL 60 MG PO TABS: 60.0000 mg | ORAL_TABLET | Freq: Every day | ORAL | 3 refills | Status: DC

## 2019-07-08 MED ORDER — GUAIFENESIN-DM 100-10 MG/5ML PO SYRP: 5.0000 mL | ORAL_SOLUTION | ORAL | 0 refills | Status: DC | PRN

## 2019-07-08 MED ORDER — INSULIN LISPRO 100 UNIT/ML ~~LOC~~ SOLN: SUBCUTANEOUS | 3 refills | Status: DC

## 2019-07-08 MED ORDER — ONDANSETRON HCL 4 MG PO TABS: 4.0000 mg | ORAL_TABLET | Freq: Three times a day (TID) | ORAL | 6 refills | Status: DC | PRN

## 2019-07-08 NOTE — Telephone Encounter (Signed)
Patient's chest x-ray showed a possible small right pleural effusion so I sent a refill for the Augmentin for him.

## 2019-07-08 NOTE — Progress Notes (Signed)
BP 139/82   Pulse 84   Temp 98 F (36.7 C)   Ht 5' 10" (1.778 m)   Wt 203 lb (92.1 kg)   SpO2 100%   BMI 29.13 kg/m    Subjective:   Patient ID: Patrick Brown, male    DOB: [birth year], 49 y.o.   MRN: [id number]  HPI: Patrick Brown is a 49 y.o. male presenting on 07/08/2019 for Medical Management of Chronic Issues, Diabetes, and Chronic Kidney Disease   HPI Anxiety Current rx-Remeron and lorazepam 0.5 mg daily as needed and Cymbalta # meds rx-30 Effectiveness of current meds-working well Adverse reactions form meds-none  Pill count performed-No Last drug screen -N/A ( high risk q84m moderate risk q672mlow risk yearly ) Urine drug [place] today- Yes [place]  If yes were their any concerning findings? -None  No flowsheet data found.   Controlled substance contract signed on: Today  Hypothyroidism recheck Patient is coming in for thyroid recheck today as well. They deny any issues with hair changes or heat or cold problems or diarrhea or constipation. They deny any chest pain or palpitations. They are currently on levothyroxine 125 micrograms   Patient has diabetes management endocrinology  Patient has stage III CKD managed by nephrology  Relevant past medical, surgical, family and social history reviewed and updated as indicated. Interim medical history since our last visit reviewed. Allergies and medications reviewed and updated.  Review of Systems  Constitutional: Negative for chills and fever.  Respiratory: Negative for shortness of breath and wheezing.   Cardiovascular: Negative for chest pain and leg swelling.  Musculoskeletal: Negative for back pain and gait problem.  Skin: Negative for rash.  Neurological: Negative for dizziness, weakness and light-headedness.  All other systems reviewed and are negative.   Per HPI unless specifically indicated above   Allergies as of 07/08/2019      Reactions   Sulfa Antibiotics Other (See Comments)    High potassium   Ramipril Cough   Versed [midazolam] Other (See Comments)   "I don't wake up very good or clear it out of my system"      Medication List       Accurate as of July 08, 2019  9:14 AM. If you have any questions, ask your nurse or doctor.        acetaminophen 500 MG tablet Commonly known as: TYLENOL Take 1,000 mg by mouth 2 (two) times daily.   acyclovir 400 MG tablet Commonly known as: ZOVIRAX TAKE 1 TABLET (400 MG TOTAL) BY MOUTH 2 (TWO) TIMES DAILY.   AgaMatrix Ultra-Thin Lancets Misc Test BS 4 times a day and prn Dx E10.65   amLODipine 10 MG tablet Commonly known as: NORVASC Take 1 tablet by mouth daily.   amoxicillin-clavulanate 875-125 MG tablet Commonly known as: AUGMENTIN Take 1 tablet by mouth 2 (two) times daily.   aspirin 81 MG chewable tablet Chew 81 mg by mouth every morning.   cetirizine 10 MG tablet Commonly known as: ZYRTEC TAKE 1 TABLET BY MOUTH EVERY DAY   cyclobenzaprine 10 MG tablet Commonly known as: FLEXERIL Take 1 tablet (10 mg total) by mouth 3 (three) times daily as needed for muscle spasms.   diclofenac sodium 1 % Gel Commonly known as: VOLTAREN Apply 2 g topically 4 (four) times daily.   diphenoxylate-atropine 2.5-0.025 MG tablet Commonly known as: Lomotil Take 1 tablet by mouth 4 (four) times daily as needed for diarrhea or loose stools.   DULoxetine 30  MG capsule Commonly known as: Cymbalta Take 1 capsule (30 mg total) by mouth daily. Take with the 68mfor total of 977m  DULoxetine 60 MG capsule Commonly known as: CYMBALTA Take 1 capsule (60 mg total) by mouth daily. Take with 30 mg fot a total of 9064m esomeprazole 40 MG capsule Commonly known as: NEXIUM TAKE 1 CAPSULE BY MOUTH EVERY DAY   febuxostat 40 MG tablet Commonly known as: ULORIC TAKE 1 TABLET BY MOUTH EVERY DAY   ferrous sulfate 325 (65 FE) MG tablet Take 650 mg by mouth daily with breakfast.   fexofenadine 60 MG tablet Commonly known as:  Allegra Allergy Take 1 tablet (60 mg total) by mouth daily. Started by: JosWorthy RancherD   fludrocortisone 0.1 MG tablet Commonly known as: FLORINEF Take 0.1 mg by mouth daily.   fluticasone 50 MCG/ACT nasal spray Commonly known as: FLONASE SPRAY 2 SPRAYS INTO EACH NOSTRIL EVERY DAY   furosemide 40 MG tablet Commonly known as: LASIX Take 80 mg by mouth daily. 33m33m , 20mg58m  GLUCOSAMINE 1500 COMPLEX PO Take 1 tablet by mouth 2 (two) times daily.   glucose blood test strip Commonly known as: OneTouch Verio TEST BLOOD SUGAR 4 TIMES DAILY AND AS NEEDED   guaiFENesin-dextromethorphan 100-10 MG/5ML syrup Commonly known as: ROBITUSSIN DM Take 5 mLs by mouth every 4 (four) hours as needed for cough. Started by: JoshuFransisca Kaufmanninger, MD   hyoscyamine 0.125 MG tablet Commonly known as: LEVSIN TAKE 1 TABLET (0.125 MG TOTAL) BY MOUTH EVERY 4 (FOUR) HOURS AS NEEDED.   insulin lispro 100 UNIT/ML injection Commonly known as: HumaLOG USE 42 UNITS TO 120 UNITS PER PUMP DAILY AS DIRECTED   ipratropium 0.03 % nasal spray Commonly known as: ATROVENT USE 2 SPRAY IN EACH NOSTRIL 2-3 TIMES DAILY   levothyroxine 125 MCG tablet Commonly known as: SYNTHROID Take 1 tablet (125 mcg total) by mouth daily.   lipase/protease/amylase 12000-38000 units Cpep capsule Commonly known as: CREON Take 2 capsules prior to meals and 1 capsule prior to snacks   LORazepam 0.5 MG tablet Commonly known as: ATIVAN Take 1 tablet (0.5 mg total) by mouth daily as needed. Start taking on: August 02, 2019 What changed: These instructions start on August 02, 2019. If you are unsure what to do until then, ask your doctor or other care provider. Changed by: JoshuFransisca Kaufmanninger, MD   losartan 100 MG tablet Commonly known as: COZAAR Take 100 mg by mouth daily.   meclizine 12.5 MG tablet Commonly known as: ANTIVERT Take 1 tablet (12.5 mg total) by mouth 3 (three) times daily as needed for dizziness.     metoCLOPramide 5 MG tablet Commonly known as: REGLAN TAKE 1 TABLET BY MOUTH 3 TIMES A DAY BEFORE MEALS   mirtazapine 30 MG tablet Commonly known as: REMERON Take 0.5 tablets (15 mg total) by mouth at bedtime. Needs to be seen for further refills. What changed: how much to take   niacin 1000 MG CR tablet Commonly known as: NIASPAN TAKE 1 TABLET (1,000 MG TOTAL) BY MOUTH AT BEDTIME. What changed: See the new instructions.   ondansetron 4 MG tablet Commonly known as: ZOFRAN Take 1 tablet (4 mg total) by mouth every 8 (eight) hours as needed for nausea.   OVER THE COUNTER MEDICATION Take 1 capsule by mouth daily. PhillHardin Negusbiotic daily   polyethylene glycol powder 17 GM/SCOOP powder Commonly known as: GLYCOLAX/MIRALAX Take 17 g by mouth daily  as needed.   promethazine 25 MG suppository Commonly known as: Phenergan Place 1 suppository (25 mg total) rectally every 6 (six) hours as needed for nausea or vomiting.   simvastatin 40 MG tablet Commonly known as: ZOCOR TAKE 1 TABLET BY MOUTH EVERYDAY AT BEDTIME   tamsulosin 0.4 MG Caps capsule Commonly known as: FLOMAX Take 0.4 mg by mouth daily.   Testosterone 20.25 MG/ACT (1.62%) Gel APPLY 3 PUMPS DAILY AS DIRECTED   Triumeq 600-50-300 MG tablet Generic drug: abacavir-dolutegravir-lamiVUDine Take 1 tablet by mouth daily.   Vascepa 1 g capsule Generic drug: icosapent Ethyl TAKE 2 CAPSULES (2 G TOTAL) BY MOUTH 2 (TWO) TIMES DAILY.        Objective:   BP 139/82   Pulse 84   Temp 98 F (36.7 C)   Ht 5' 10" (1.778 m)   Wt 203 lb (92.1 kg)   SpO2 100%   BMI 29.13 kg/m   Wt Readings from Last 3 Encounters:  07/08/19 203 lb (92.1 kg)  06/24/19 199 lb 6 oz (90.4 kg)  01/07/19 191 lb (86.6 kg)    Physical Exam Vitals and nursing note reviewed.  Constitutional:      General: He is not in acute distress.    Appearance: He is well-developed. He is not diaphoretic.  Eyes:     General: No scleral icterus.     Conjunctiva/sclera: Conjunctivae normal.  Neck:     Thyroid: No thyromegaly.  Cardiovascular:     Rate and Rhythm: Normal rate and regular rhythm.     Heart sounds: Normal heart sounds. No murmur heard.   Pulmonary:     Effort: Pulmonary effort is normal. No respiratory distress.     Breath sounds: Normal breath sounds. No wheezing.  Musculoskeletal:        General: Normal range of motion.     Cervical back: Neck supple.  Lymphadenopathy:     Cervical: No cervical adenopathy.  Skin:    General: Skin is warm and dry.     Findings: No rash.  Neurological:     Mental Status: He is alert and oriented to person, place, and time.     Coordination: Coordination normal.  Psychiatric:        Behavior: Behavior normal.     Results for orders placed or performed in visit on 07/06/19  CBC with Differential/Platelet  Result Value Ref Range   WBC 8.7 3.4 - 10.8 x10E3/uL   RBC 2.45 (LL) 4.14 - 5.80 x10E6/uL   Hemoglobin 8.2 (LL) 13.0 - 17.7 g/dL   Hematocrit 24.6 (L) 37.5 - 51.0 %   MCV 100 (H) 79 - 97 fL   MCH 33.5 (H) 26.6 - 33.0 pg   MCHC 33.3 31.5 - 35.7 g/dL   RDW 15.0 11.6 - 15.4 %   Platelets 221 150 - 450 x10E3/uL   Neutrophils 56 Not Estab. %   Lymphs 24 Not Estab. %   Monocytes 7 Not Estab. %   Eos 12 Not Estab. %   Basos 1 Not Estab. %   Neutrophils Absolute 4.8 1.4 - 7.0 x10E3/uL   Lymphocytes Absolute 2.1 0.7 - 3.1 x10E3/uL   Monocytes Absolute 0.6 0.1 - 0.9 x10E3/uL   EOS (ABSOLUTE) 1.1 (H) 0.0 - 0.4 x10E3/uL   Basophils Absolute 0.1 0.0 - 0.2 x10E3/uL   Immature Granulocytes 0 Not Estab. %   Immature Grans (Abs) 0.0 0.0 - 0.1 x10E3/uL  CMP14+EGFR  Result Value Ref Range   Glucose 89 65 -  99 mg/dL   BUN 63 (H) 6 - 24 mg/dL   Creatinine, Ser 5.65 (HH) 0.76 - 1.27 mg/dL   GFR calc non Af Amer 11 (L) >59 mL/min/1.73   GFR calc Af Amer 12 (L) >59 mL/min/1.73   BUN/Creatinine Ratio 11 9 - 20   Sodium 137 134 - 144 mmol/L   Potassium 4.2 3.5 - 5.2 mmol/L    Chloride 103 96 - 106 mmol/L   CO2 17 (L) 20 - 29 mmol/L   Calcium 8.4 (L) 8.7 - 10.2 mg/dL   Total Protein 5.3 (L) 6.0 - 8.5 g/dL   Albumin 3.5 (L) 4.0 - 5.0 g/dL   Globulin, Total 1.8 1.5 - 4.5 g/dL   Albumin/Globulin Ratio 1.9 1.2 - 2.2   Bilirubin Total 0.2 0.0 - 1.2 mg/dL   Alkaline Phosphatase 113 48 - 121 IU/L   AST 18 0 - 40 IU/L   ALT 14 0 - 44 IU/L  Lipid panel  Result Value Ref Range   Cholesterol, Total 130 100 - 199 mg/dL   Triglycerides 60 0 - 149 mg/dL   HDL 66 >39 mg/dL   VLDL Cholesterol Cal 13 5 - 40 mg/dL   LDL Chol Calc (NIH) 51 0 - 99 mg/dL   Chol/HDL Ratio 2.0 0.0 - 5.0 ratio    Assessment & Plan:   Problem List Items Addressed This Visit      Respiratory   Non-seasonal allergic rhinitis due to pollen   Relevant Orders   DG Chest 2 View (Completed)     Endocrine   Type 1 diabetes mellitus (HCC)   Relevant Medications   insulin lispro (HUMALOG) 100 UNIT/ML injection   Acquired hypothyroidism   Relevant Medications   levothyroxine (SYNTHROID) 125 MCG tablet   Other Relevant Orders   TSH     Genitourinary   Chronic kidney disease, stage 3, mod decreased GFR - Primary     Other   HIV disease (HCC)   Anxiety   Relevant Medications   LORazepam (ATIVAN) 0.5 MG tablet (Start on 08/02/2019)   Other Relevant Orders   ToxASSURE Select 13 (MW), Urine (Completed)      Changed allergy pill to Allegra, refilled lorazepam for anxiety, will do tox assure today.  Kidney function worsening and he is following up with nephrology and is going to get on the transplant list as well as talking about dialysis.  He follows up with endocrinology for his diabetes.  Recheck thyroid in 1 month Follow up plan: Return in about 3 months (around 10/08/2019), or if symptoms worsen or fail to improve, for Recheck cough and renal function.  Counseling provided for all of the vaccine components Orders Placed This Encounter  Procedures  . DG Chest 2 View  . TSH  .  ToxASSURE Select 13 (MW), Urine    Caryl Pina, MD [facility] 07/08/2019, 9:14 AM

## 2019-07-09 NOTE — Telephone Encounter (Signed)
Aware of new medicine. 

## 2019-07-11 ENCOUNTER — Encounter: Payer: Self-pay | Admitting: Family Medicine

## 2019-07-12 ENCOUNTER — Other Ambulatory Visit: Payer: Self-pay | Admitting: Family Medicine

## 2019-07-12 ENCOUNTER — Telehealth: Payer: Self-pay | Admitting: Family Medicine

## 2019-07-12 MED ORDER — HYDROCODONE-HOMATROPINE 5-1.5 MG/5ML PO SYRP: 5.0000 mL | ORAL_SOLUTION | Freq: Four times a day (QID) | ORAL | 0 refills | Status: DC | PRN

## 2019-07-12 NOTE — Telephone Encounter (Signed)
Patient aware.

## 2019-07-12 NOTE — Telephone Encounter (Signed)
I just responded to his a chart message so he should have the response so I do not know that you should need to call him but I did just send it in for him via E- chart message

## 2019-07-12 NOTE — Progress Notes (Signed)
Sent cough syrup for the patient 

## 2019-07-12 NOTE — Telephone Encounter (Signed)
Pt called to get update on when or if Dr Dettinger could call in Rx for Hydromet for his cough since he cant take Robitussin Rx because it raises his BP.

## 2019-07-13 DIAGNOSIS — R3914 Feeling of incomplete bladder emptying: Secondary | ICD-10-CM | POA: Diagnosis not present

## 2019-07-13 DIAGNOSIS — D631 Anemia in chronic kidney disease: Secondary | ICD-10-CM | POA: Diagnosis not present

## 2019-07-13 DIAGNOSIS — N185 Chronic kidney disease, stage 5: Secondary | ICD-10-CM | POA: Diagnosis not present

## 2019-07-13 LAB — TOXASSURE SELECT 13 (MW), URINE

## 2019-07-14 ENCOUNTER — Other Ambulatory Visit: Payer: Self-pay | Admitting: Family Medicine

## 2019-07-14 ENCOUNTER — Encounter: Payer: Self-pay | Admitting: Family Medicine

## 2019-07-14 DIAGNOSIS — D510 Vitamin B12 deficiency anemia due to intrinsic factor deficiency: Secondary | ICD-10-CM | POA: Diagnosis not present

## 2019-07-14 DIAGNOSIS — D631 Anemia in chronic kidney disease: Secondary | ICD-10-CM | POA: Diagnosis not present

## 2019-07-14 DIAGNOSIS — G9349 Other encephalopathy: Secondary | ICD-10-CM | POA: Diagnosis not present

## 2019-07-14 DIAGNOSIS — R7401 Elevation of levels of liver transaminase levels: Secondary | ICD-10-CM | POA: Insufficient documentation

## 2019-07-14 DIAGNOSIS — D721 Eosinophilia, unspecified: Secondary | ICD-10-CM | POA: Diagnosis not present

## 2019-07-14 DIAGNOSIS — N185 Chronic kidney disease, stage 5: Secondary | ICD-10-CM | POA: Diagnosis not present

## 2019-07-14 DIAGNOSIS — E7211 Homocystinuria: Secondary | ICD-10-CM | POA: Diagnosis not present

## 2019-07-14 DIAGNOSIS — Z79899 Other long term (current) drug therapy: Secondary | ICD-10-CM | POA: Diagnosis not present

## 2019-07-14 DIAGNOSIS — E1022 Type 1 diabetes mellitus with diabetic chronic kidney disease: Secondary | ICD-10-CM | POA: Diagnosis not present

## 2019-07-14 DIAGNOSIS — Z794 Long term (current) use of insulin: Secondary | ICD-10-CM | POA: Diagnosis not present

## 2019-07-14 DIAGNOSIS — D539 Nutritional anemia, unspecified: Secondary | ICD-10-CM | POA: Diagnosis not present

## 2019-07-14 DIAGNOSIS — R898 Other abnormal findings in specimens from other organs, systems and tissues: Secondary | ICD-10-CM | POA: Insufficient documentation

## 2019-07-14 MED ORDER — HYDROCODONE-HOMATROPINE 5-1.5 MG/5ML PO SYRP: 5.0000 mL | ORAL_SOLUTION | Freq: Four times a day (QID) | ORAL | 0 refills | Status: DC | PRN

## 2019-07-15 ENCOUNTER — Encounter: Payer: Self-pay | Admitting: Family Medicine

## 2019-07-15 DIAGNOSIS — J90 Pleural effusion, not elsewhere classified: Secondary | ICD-10-CM

## 2019-07-15 DIAGNOSIS — R053 Chronic cough: Secondary | ICD-10-CM

## 2019-07-16 ENCOUNTER — Other Ambulatory Visit: Payer: Self-pay | Admitting: Family Medicine

## 2019-07-16 DIAGNOSIS — Z4901 Encounter for fitting and adjustment of extracorporeal dialysis catheter: Secondary | ICD-10-CM | POA: Diagnosis not present

## 2019-07-16 DIAGNOSIS — R05 Cough: Secondary | ICD-10-CM | POA: Diagnosis not present

## 2019-07-16 DIAGNOSIS — J90 Pleural effusion, not elsewhere classified: Secondary | ICD-10-CM | POA: Diagnosis not present

## 2019-07-16 DIAGNOSIS — N170 Acute kidney failure with tubular necrosis: Secondary | ICD-10-CM | POA: Diagnosis not present

## 2019-07-16 DIAGNOSIS — K828 Other specified diseases of gallbladder: Secondary | ICD-10-CM | POA: Diagnosis not present

## 2019-07-16 DIAGNOSIS — R3914 Feeling of incomplete bladder emptying: Secondary | ICD-10-CM | POA: Diagnosis not present

## 2019-07-16 DIAGNOSIS — D631 Anemia in chronic kidney disease: Secondary | ICD-10-CM | POA: Diagnosis not present

## 2019-07-16 DIAGNOSIS — I120 Hypertensive chronic kidney disease with stage 5 chronic kidney disease or end stage renal disease: Secondary | ICD-10-CM | POA: Diagnosis not present

## 2019-07-16 DIAGNOSIS — N185 Chronic kidney disease, stage 5: Secondary | ICD-10-CM | POA: Diagnosis not present

## 2019-07-16 DIAGNOSIS — R918 Other nonspecific abnormal finding of lung field: Secondary | ICD-10-CM | POA: Diagnosis not present

## 2019-07-16 DIAGNOSIS — I10 Essential (primary) hypertension: Secondary | ICD-10-CM | POA: Diagnosis not present

## 2019-07-16 DIAGNOSIS — Z992 Dependence on renal dialysis: Secondary | ICD-10-CM | POA: Diagnosis not present

## 2019-07-16 DIAGNOSIS — N186 End stage renal disease: Secondary | ICD-10-CM | POA: Diagnosis not present

## 2019-07-16 DIAGNOSIS — I129 Hypertensive chronic kidney disease with stage 1 through stage 4 chronic kidney disease, or unspecified chronic kidney disease: Secondary | ICD-10-CM | POA: Diagnosis not present

## 2019-07-16 DIAGNOSIS — N289 Disorder of kidney and ureter, unspecified: Secondary | ICD-10-CM | POA: Diagnosis not present

## 2019-07-16 DIAGNOSIS — R195 Other fecal abnormalities: Secondary | ICD-10-CM | POA: Diagnosis not present

## 2019-07-16 DIAGNOSIS — D721 Eosinophilia, unspecified: Secondary | ICD-10-CM | POA: Diagnosis not present

## 2019-07-16 DIAGNOSIS — N179 Acute kidney failure, unspecified: Secondary | ICD-10-CM | POA: Diagnosis not present

## 2019-07-16 DIAGNOSIS — E039 Hypothyroidism, unspecified: Secondary | ICD-10-CM | POA: Diagnosis not present

## 2019-07-16 DIAGNOSIS — Z01818 Encounter for other preprocedural examination: Secondary | ICD-10-CM | POA: Diagnosis not present

## 2019-07-16 DIAGNOSIS — N184 Chronic kidney disease, stage 4 (severe): Secondary | ICD-10-CM | POA: Diagnosis not present

## 2019-07-16 DIAGNOSIS — E1022 Type 1 diabetes mellitus with diabetic chronic kidney disease: Secondary | ICD-10-CM | POA: Diagnosis not present

## 2019-07-16 DIAGNOSIS — M1000 Idiopathic gout, unspecified site: Secondary | ICD-10-CM | POA: Diagnosis not present

## 2019-07-16 DIAGNOSIS — E2689 Other hyperaldosteronism: Secondary | ICD-10-CM | POA: Diagnosis not present

## 2019-07-16 DIAGNOSIS — D539 Nutritional anemia, unspecified: Secondary | ICD-10-CM | POA: Diagnosis not present

## 2019-07-16 DIAGNOSIS — K8689 Other specified diseases of pancreas: Secondary | ICD-10-CM | POA: Diagnosis not present

## 2019-07-16 DIAGNOSIS — E8770 Fluid overload, unspecified: Secondary | ICD-10-CM | POA: Diagnosis not present

## 2019-07-16 DIAGNOSIS — I313 Pericardial effusion (noninflammatory): Secondary | ICD-10-CM | POA: Diagnosis not present

## 2019-07-16 DIAGNOSIS — R601 Generalized edema: Secondary | ICD-10-CM | POA: Diagnosis not present

## 2019-07-16 DIAGNOSIS — R17 Unspecified jaundice: Secondary | ICD-10-CM | POA: Diagnosis not present

## 2019-07-16 DIAGNOSIS — K219 Gastro-esophageal reflux disease without esophagitis: Secondary | ICD-10-CM | POA: Diagnosis not present

## 2019-07-16 DIAGNOSIS — E785 Hyperlipidemia, unspecified: Secondary | ICD-10-CM | POA: Diagnosis not present

## 2019-07-16 DIAGNOSIS — K8681 Exocrine pancreatic insufficiency: Secondary | ICD-10-CM | POA: Diagnosis not present

## 2019-07-16 DIAGNOSIS — Z9641 Presence of insulin pump (external) (internal): Secondary | ICD-10-CM | POA: Diagnosis not present

## 2019-07-16 DIAGNOSIS — Z882 Allergy status to sulfonamides status: Secondary | ICD-10-CM | POA: Diagnosis not present

## 2019-07-16 DIAGNOSIS — E538 Deficiency of other specified B group vitamins: Secondary | ICD-10-CM | POA: Diagnosis not present

## 2019-07-16 DIAGNOSIS — N189 Chronic kidney disease, unspecified: Secondary | ICD-10-CM | POA: Diagnosis not present

## 2019-07-16 DIAGNOSIS — J189 Pneumonia, unspecified organism: Secondary | ICD-10-CM | POA: Diagnosis not present

## 2019-07-16 DIAGNOSIS — G4733 Obstructive sleep apnea (adult) (pediatric): Secondary | ICD-10-CM | POA: Diagnosis not present

## 2019-07-17 ENCOUNTER — Other Ambulatory Visit: Payer: Self-pay | Admitting: Family Medicine

## 2019-07-19 ENCOUNTER — Telehealth: Payer: Self-pay | Admitting: *Deleted

## 2019-07-19 NOTE — Telephone Encounter (Signed)
Received call from pharmacist at [facility]. Patient hospitalized now with progressive kidney disease, now will be starting hemodialysis.  AV fistula created today. He is on Triumeq, they are notifying Dr Linus Salmons, asking for medication change. Per Dr Linus Salmons, patient ok to switch to [facility]. Mikel Cella will call with patient's discharge information, patient should follow up here 1-2 months after for lab work. Landis Gandy, RN

## 2019-07-23 NOTE — Telephone Encounter (Signed)
Pharmacist from [facility] called to let us know that the patient has been discharged from the hospital. Patient has been scheduled for a follow up visit with Dr. Linus Salmons and labs. Patient has been notified through his mychart regarding his appointment

## 2019-07-26 ENCOUNTER — Telehealth: Payer: Self-pay | Admitting: Family Medicine

## 2019-07-26 ENCOUNTER — Telehealth: Payer: Self-pay | Admitting: *Deleted

## 2019-07-26 NOTE — Telephone Encounter (Signed)
    Transitional Care Management  Contact Attempt Attempt Date:07/26/2019 Attempted By: Eston Mould, LPN  1st unsuccessful TCM contact attempt.   I reached out to [facility] on his preferred telephone number to discuss Transitional Care Management, medication reconciliation, and to schedule a TCM hospital follow-up with his PCP at [facility].  Discharge Date: 07/24/19 Location: [facility] Discharge Dx: Acute on Chronic Renal Insufficiency  Recommendations for Outpatient Follow-up:   (insert from discharge summary) Follow up with PCP in 7-14 days   Plan I left a HIPPA compliant message for him to return my call.  Will attempt to contact again within the 2 business day post discharge window if he does not return my call.

## 2019-07-27 DIAGNOSIS — D509 Iron deficiency anemia, unspecified: Secondary | ICD-10-CM | POA: Diagnosis not present

## 2019-07-27 DIAGNOSIS — D631 Anemia in chronic kidney disease: Secondary | ICD-10-CM | POA: Diagnosis not present

## 2019-07-27 DIAGNOSIS — N186 End stage renal disease: Secondary | ICD-10-CM | POA: Diagnosis not present

## 2019-07-27 NOTE — Telephone Encounter (Signed)
     Transitional Care Management  Contact Attempt Attempt Date:07/27/2019 Attempted By:   2nd unsuccessful TCM contact attempt.   I reached out to [facility] on his preferred telephone number to discuss Transitional Care Management, medication reconciliation, and to schedule a TCM hospital follow-up with his PCP at [facility].  Discharge Date: 07/24/2019 Location: [facility]  Discharge Dx: Acute on Chronic Renal Insufficiency  Recommendations for Outpatient Follow-up:  -Follow up with PCP in days.    Plan I left a HIPPA compliant message for him to return my call.  Will attempt to contact again within the 2 business day post discharge window if he does not return my call.

## 2019-07-29 ENCOUNTER — Other Ambulatory Visit: Payer: Self-pay | Admitting: Family Medicine

## 2019-07-29 DIAGNOSIS — F419 Anxiety disorder, unspecified: Secondary | ICD-10-CM

## 2019-07-29 DIAGNOSIS — D631 Anemia in chronic kidney disease: Secondary | ICD-10-CM | POA: Diagnosis not present

## 2019-07-29 DIAGNOSIS — D509 Iron deficiency anemia, unspecified: Secondary | ICD-10-CM | POA: Diagnosis not present

## 2019-07-29 DIAGNOSIS — N186 End stage renal disease: Secondary | ICD-10-CM | POA: Diagnosis not present

## 2019-07-30 ENCOUNTER — Telehealth: Payer: Self-pay | Admitting: *Deleted

## 2019-07-30 NOTE — Telephone Encounter (Signed)
FYI  Patient says he does not need a pulmonology referral.  He had a fluid over load and is now having dyalisis treatments.  He is scheduled with Dr. Warrick Parisian for follow up next week.

## 2019-07-30 NOTE — Telephone Encounter (Signed)
° ° °  Transitional Care Management  Contact Attempt Attempt Date:07/30/2019 Attempted By: Lynnea Ferrier, LPN  3rd unsuccessful TCM contact attempt.   I reached out to [facility] on his preferred telephone number to discuss Transitional Care Management, medication reconciliation, and to schedule a TCM hospital follow-up with his PCP at [facility].  Discharge Date: 07/24/2019 Location: [facility] Discharge Dx: Acute on Chronic Renal Insufficiency  Recommendations for Outpatient Follow-up:  Follow up with PCP in 7-14 days   Plan I left a HIPPA compliant message for him to return my call.  Will attempt to contact again within the 2 business day post discharge window if he does not return my call.

## 2019-07-31 DIAGNOSIS — N186 End stage renal disease: Secondary | ICD-10-CM | POA: Diagnosis not present

## 2019-07-31 DIAGNOSIS — D631 Anemia in chronic kidney disease: Secondary | ICD-10-CM | POA: Diagnosis not present

## 2019-07-31 DIAGNOSIS — D509 Iron deficiency anemia, unspecified: Secondary | ICD-10-CM | POA: Diagnosis not present

## 2019-08-02 DIAGNOSIS — D631 Anemia in chronic kidney disease: Secondary | ICD-10-CM | POA: Diagnosis not present

## 2019-08-02 DIAGNOSIS — D510 Vitamin B12 deficiency anemia due to intrinsic factor deficiency: Secondary | ICD-10-CM | POA: Diagnosis not present

## 2019-08-02 DIAGNOSIS — Z79899 Other long term (current) drug therapy: Secondary | ICD-10-CM | POA: Diagnosis not present

## 2019-08-02 DIAGNOSIS — D539 Nutritional anemia, unspecified: Secondary | ICD-10-CM | POA: Diagnosis not present

## 2019-08-02 DIAGNOSIS — N185 Chronic kidney disease, stage 5: Secondary | ICD-10-CM | POA: Diagnosis not present

## 2019-08-02 DIAGNOSIS — R7401 Elevation of levels of liver transaminase levels: Secondary | ICD-10-CM | POA: Diagnosis not present

## 2019-08-02 DIAGNOSIS — D721 Eosinophilia, unspecified: Secondary | ICD-10-CM | POA: Diagnosis not present

## 2019-08-02 DIAGNOSIS — Z794 Long term (current) use of insulin: Secondary | ICD-10-CM | POA: Diagnosis not present

## 2019-08-03 DIAGNOSIS — D509 Iron deficiency anemia, unspecified: Secondary | ICD-10-CM | POA: Diagnosis not present

## 2019-08-03 DIAGNOSIS — D631 Anemia in chronic kidney disease: Secondary | ICD-10-CM | POA: Diagnosis not present

## 2019-08-03 DIAGNOSIS — N186 End stage renal disease: Secondary | ICD-10-CM | POA: Diagnosis not present

## 2019-08-03 NOTE — Telephone Encounter (Signed)
Canceled ref per Nurse and Patient request.

## 2019-08-04 DIAGNOSIS — Z794 Long term (current) use of insulin: Secondary | ICD-10-CM | POA: Diagnosis not present

## 2019-08-04 DIAGNOSIS — D631 Anemia in chronic kidney disease: Secondary | ICD-10-CM | POA: Diagnosis not present

## 2019-08-04 DIAGNOSIS — D510 Vitamin B12 deficiency anemia due to intrinsic factor deficiency: Secondary | ICD-10-CM | POA: Diagnosis not present

## 2019-08-04 DIAGNOSIS — Z79899 Other long term (current) drug therapy: Secondary | ICD-10-CM | POA: Diagnosis not present

## 2019-08-04 DIAGNOSIS — R7401 Elevation of levels of liver transaminase levels: Secondary | ICD-10-CM | POA: Diagnosis not present

## 2019-08-04 DIAGNOSIS — D539 Nutritional anemia, unspecified: Secondary | ICD-10-CM | POA: Diagnosis not present

## 2019-08-04 DIAGNOSIS — N185 Chronic kidney disease, stage 5: Secondary | ICD-10-CM | POA: Diagnosis not present

## 2019-08-04 DIAGNOSIS — G4733 Obstructive sleep apnea (adult) (pediatric): Secondary | ICD-10-CM | POA: Diagnosis not present

## 2019-08-04 DIAGNOSIS — N186 End stage renal disease: Secondary | ICD-10-CM | POA: Diagnosis not present

## 2019-08-04 DIAGNOSIS — D721 Eosinophilia, unspecified: Secondary | ICD-10-CM | POA: Diagnosis not present

## 2019-08-04 DIAGNOSIS — Z992 Dependence on renal dialysis: Secondary | ICD-10-CM | POA: Diagnosis not present

## 2019-08-05 DIAGNOSIS — E8779 Other fluid overload: Secondary | ICD-10-CM | POA: Diagnosis not present

## 2019-08-05 DIAGNOSIS — Z23 Encounter for immunization: Secondary | ICD-10-CM | POA: Diagnosis not present

## 2019-08-05 DIAGNOSIS — N186 End stage renal disease: Secondary | ICD-10-CM | POA: Diagnosis not present

## 2019-08-05 DIAGNOSIS — D631 Anemia in chronic kidney disease: Secondary | ICD-10-CM | POA: Diagnosis not present

## 2019-08-05 DIAGNOSIS — D510 Vitamin B12 deficiency anemia due to intrinsic factor deficiency: Secondary | ICD-10-CM | POA: Diagnosis not present

## 2019-08-05 DIAGNOSIS — N2581 Secondary hyperparathyroidism of renal origin: Secondary | ICD-10-CM | POA: Diagnosis not present

## 2019-08-05 DIAGNOSIS — D509 Iron deficiency anemia, unspecified: Secondary | ICD-10-CM | POA: Diagnosis not present

## 2019-08-06 ENCOUNTER — Ambulatory Visit (INDEPENDENT_AMBULATORY_CARE_PROVIDER_SITE_OTHER): Payer: Medicare Other | Admitting: Family Medicine

## 2019-08-06 ENCOUNTER — Other Ambulatory Visit: Payer: Self-pay

## 2019-08-06 ENCOUNTER — Encounter: Payer: Self-pay | Admitting: Family Medicine

## 2019-08-06 VITALS — BP 156/81 | HR 80 | Temp 98.6°F | Ht 70.0 in | Wt 185.0 lb

## 2019-08-06 DIAGNOSIS — E039 Hypothyroidism, unspecified: Secondary | ICD-10-CM | POA: Diagnosis not present

## 2019-08-06 DIAGNOSIS — N186 End stage renal disease: Secondary | ICD-10-CM

## 2019-08-06 DIAGNOSIS — D510 Vitamin B12 deficiency anemia due to intrinsic factor deficiency: Secondary | ICD-10-CM | POA: Diagnosis not present

## 2019-08-06 MED ORDER — PROMETHAZINE-DM 6.25-15 MG/5ML PO SYRP
5.0000 mL | ORAL_SOLUTION | Freq: Four times a day (QID) | ORAL | 0 refills | Status: DC | PRN
Start: 2019-08-06 — End: 2020-06-05

## 2019-08-06 NOTE — Progress Notes (Signed)
BP (!) 156/81   Pulse 80   Temp 98.6 F (37 C)   Ht 5\' 10"  (1.778 m)   Wt 185 lb (83.9 kg)   SpO2 98%   BMI 26.54 kg/m    Subjective:   Patient ID: Patrick Brown, male    DOB: 1970/03/14, 49 y.o.   MRN: [id number]  HPI: Patrick Brown is a 49 y.o. male presenting on 08/06/2019 for Medical Management of Chronic Issues and Chronic Kidney Disease   HPI Hypothyroidism recheck Patient is coming in for thyroid recheck today as well. They deny any issues with hair changes or heat or cold problems or diarrhea or constipation. They deny any chest pain or palpitations. They are currently on levothyroxine 125 micrograms   Patient is currently on hemodialysis now and he has been doing it 3 times a week and working with the nephrologist and is working to get on the transplant list as well.  Fistula appears to be intact and healing well on his left forearm  Relevant past medical, surgical, family and social history reviewed and updated as indicated. Interim medical history since our last visit reviewed. Allergies and medications reviewed and updated.  Review of Systems  Constitutional: Positive for fatigue. Negative for chills and fever.  Eyes: Negative for visual disturbance.  Respiratory: Negative for shortness of breath and wheezing.   Cardiovascular: Negative for chest pain and leg swelling.  Musculoskeletal: Negative for back pain and gait problem.  Skin: Negative for rash.  All other systems reviewed and are negative.   Per HPI unless specifically indicated above   Allergies as of 08/06/2019      Reactions   Sulfa Antibiotics Other (See Comments)   High potassium   Ramipril Cough   Versed [midazolam] Other (See Comments)   "I don't wake up very good or clear it out of my system"      Medication List       Accurate as of August 06, 2019  4:50 PM. If you have any questions, ask your nurse or doctor.        STOP taking these medications   amoxicillin-clavulanate 875-125 MG  tablet Commonly known as: AUGMENTIN Stopped by: Worthy Rancher, MD   guaiFENesin-dextromethorphan 100-10 MG/5ML syrup Commonly known as: ROBITUSSIN DM Stopped by: Worthy Rancher, MD   HYDROcodone-homatropine 5-1.5 MG/5ML syrup Commonly known as: HYCODAN Stopped by: Fransisca Kaufmann Deauna Yaw, MD     TAKE these medications   acetaminophen 500 MG tablet Commonly known as: TYLENOL Take 1,000 mg by mouth 2 (two) times daily.   acyclovir 400 MG tablet Commonly known as: ZOVIRAX TAKE 1 TABLET (400 MG TOTAL) BY MOUTH 2 (TWO) TIMES DAILY.   AgaMatrix Ultra-Thin Lancets Misc Test BS 4 times a day and prn Dx E10.65   amLODipine 10 MG tablet Commonly known as: NORVASC Take 1 tablet by mouth daily.   aspirin 81 MG chewable tablet Chew 81 mg by mouth every morning.   cetirizine 10 MG tablet Commonly known as: ZYRTEC TAKE 1 TABLET BY MOUTH EVERY DAY   cyclobenzaprine 10 MG tablet Commonly known as: FLEXERIL Take 1 tablet (10 mg total) by mouth 3 (three) times daily as needed for muscle spasms.   diclofenac sodium 1 % Gel Commonly known as: VOLTAREN Apply 2 g topically 4 (four) times daily.   diphenoxylate-atropine 2.5-0.025 MG tablet Commonly known as: Lomotil Take 1 tablet by mouth 4 (four) times daily as needed for diarrhea or loose stools.   DULoxetine 30  MG capsule Commonly known as: Cymbalta Take 1 capsule (30 mg total) by mouth daily. Take with the 29m for total of 90mg    DULoxetine 60 MG capsule Commonly known as: CYMBALTA Take 1 capsule (60 mg total) by mouth daily. Take with 30 mg fot a total of 90mg    esomeprazole 40 MG capsule Commonly known as: NEXIUM TAKE 1 CAPSULE BY MOUTH EVERY DAY   febuxostat 40 MG tablet Commonly known as: ULORIC TAKE 1 TABLET BY MOUTH EVERY DAY   ferrous sulfate 325 (65 FE) MG tablet Take 650 mg by mouth daily with breakfast.   fexofenadine 60 MG tablet Commonly known as: Allegra Allergy Take 1 tablet (60 mg total) by mouth  daily.   fludrocortisone 0.1 MG tablet Commonly known as: FLORINEF Take 0.1 mg by mouth daily.   fluticasone 50 MCG/ACT nasal spray Commonly known as: FLONASE SPRAY 2 SPRAYS INTO EACH NOSTRIL EVERY DAY   furosemide 40 MG tablet Commonly known as: LASIX Take 80 mg by mouth daily. 80mg  am , 20mg  pm   GLUCOSAMINE 1500 COMPLEX PO Take 1 tablet by mouth 2 (two) times daily.   glucose blood test strip Commonly known as: OneTouch Verio TEST BLOOD SUGAR 4 TIMES DAILY AND AS NEEDED   hyoscyamine 0.125 MG tablet Commonly known as: LEVSIN TAKE 1 TABLET (0.125 MG TOTAL) BY MOUTH EVERY 4 (FOUR) HOURS AS NEEDED.   insulin lispro 100 UNIT/ML injection Commonly known as: HumaLOG USE 42 UNITS TO 120 UNITS PER PUMP DAILY AS DIRECTED   ipratropium 0.03 % nasal spray Commonly known as: ATROVENT USE 2 SPRAY IN EACH NOSTRIL 2-3 TIMES DAILY   levothyroxine 125 MCG tablet Commonly known as: SYNTHROID Take 1 tablet (125 mcg total) by mouth daily.   lipase/protease/amylase 12000-38000 units Cpep capsule Commonly known as: CREON Take 2 capsules prior to meals and 1 capsule prior to snacks   LORazepam 0.5 MG tablet Commonly known as: ATIVAN Take 1 tablet (0.5 mg total) by mouth daily as needed.   losartan 100 MG tablet Commonly known as: COZAAR Take 100 mg by mouth daily.   meclizine 12.5 MG tablet Commonly known as: ANTIVERT Take 1 tablet (12.5 mg total) by mouth 3 (three) times daily as needed for dizziness.   metoCLOPramide 5 MG tablet Commonly known as: REGLAN TAKE 1 TABLET BY MOUTH 3 TIMES A DAY BEFORE MEALS   mirtazapine 30 MG tablet Commonly known as: REMERON Take 0.5 tablets (15 mg total) by mouth at bedtime. Needs to be seen for further refills. What changed: how much to take   niacin 1000 MG CR tablet Commonly known as: NIASPAN TAKE 1 TABLET (1,000 MG TOTAL) BY MOUTH AT BEDTIME. What changed: See the new instructions.   ondansetron 4 MG tablet Commonly known as:  ZOFRAN Take 1 tablet (4 mg total) by mouth every 8 (eight) hours as needed for nausea.   OVER THE COUNTER MEDICATION Take 1 capsule by mouth daily. [name]- probiotic daily   polyethylene glycol powder 17 GM/SCOOP powder Commonly known as: GLYCOLAX/MIRALAX Take 17 g by mouth daily as needed.   promethazine 25 MG suppository Commonly known as: Phenergan Place 1 suppository (25 mg total) rectally every 6 (six) hours as needed for nausea or vomiting.   promethazine-dextromethorphan 6.25-15 MG/5ML syrup Commonly known as: PROMETHAZINE-DM Take 5 mLs by mouth 4 (four) times daily as needed for cough. Started by: Fransisca Kaufmann Antione Obar, MD   simvastatin 40 MG tablet Commonly known as: ZOCOR TAKE 1 TABLET BY MOUTH EVERYDAY  AT BEDTIME   tamsulosin 0.4 MG Caps capsule Commonly known as: FLOMAX Take 0.4 mg by mouth daily.   Testosterone 20.25 MG/ACT (1.62%) Gel APPLY 3 PUMPS DAILY AS DIRECTED   Triumeq 600-50-300 MG tablet Generic drug: abacavir-dolutegravir-lamiVUDine Take 1 tablet by mouth daily.   Vascepa 1 g capsule Generic drug: icosapent Ethyl TAKE 2 CAPSULES (2 G TOTAL) BY MOUTH 2 (TWO) TIMES DAILY.        Objective:   BP (!) 156/81   Pulse 80   Temp 98.6 F (37 C)   Ht 5\' 10"  (1.778 m)   Wt 185 lb (83.9 kg)   SpO2 98%   BMI 26.54 kg/m   Wt Readings from Last 3 Encounters:  08/06/19 185 lb (83.9 kg)  07/08/19 203 lb (92.1 kg)  06/24/19 199 lb 6 oz (90.4 kg)    Physical Exam Vitals and nursing note reviewed.  Constitutional:      General: He is not in acute distress.    Appearance: He is well-developed. He is not diaphoretic.  Eyes:     General: No scleral icterus.    Conjunctiva/sclera: Conjunctivae normal.  Neck:     Thyroid: No thyromegaly.  Cardiovascular:     Rate and Rhythm: Normal rate and regular rhythm.     Heart sounds: Normal heart sounds. No murmur heard.   Pulmonary:     Effort: Pulmonary effort is normal. No respiratory distress.      Breath sounds: Normal breath sounds. No wheezing.  Musculoskeletal:        General: Normal range of motion.     Cervical back: Neck supple.  Lymphadenopathy:     Cervical: No cervical adenopathy.  Skin:    General: Skin is warm and dry.     Findings: No rash.     Comments: Fistula incision appears to be healing well on his left forearm  Neurological:     Mental Status: He is alert and oriented to person, place, and time.     Coordination: Coordination normal.  Psychiatric:        Behavior: Behavior normal.       Assessment & Plan:   Problem List Items Addressed This Visit      Endocrine   Acquired hypothyroidism   Relevant Orders   TSH    Other Visit Diagnoses    End stage renal disease ([place])    -  Primary      Patient doing well, we will recheck thyroid today, if it is off then we will see him back in 2 months, if not we will see him back at his normal visit in September. Follow up plan: Return in about 2 months (around 10/07/2019), or if symptoms worsen or fail to improve, for Patient already has an appointment for thyroid recheck in his usual recheck and blood work..  Counseling provided for all of the vaccine components Orders Placed This Encounter  Procedures  . TSH    Caryl Pina, MD [facility] 08/06/2019, 4:50 PM

## 2019-08-07 DIAGNOSIS — D509 Iron deficiency anemia, unspecified: Secondary | ICD-10-CM | POA: Diagnosis not present

## 2019-08-07 DIAGNOSIS — N2581 Secondary hyperparathyroidism of renal origin: Secondary | ICD-10-CM | POA: Diagnosis not present

## 2019-08-07 DIAGNOSIS — E8779 Other fluid overload: Secondary | ICD-10-CM | POA: Diagnosis not present

## 2019-08-07 DIAGNOSIS — N186 End stage renal disease: Secondary | ICD-10-CM | POA: Diagnosis not present

## 2019-08-07 DIAGNOSIS — D631 Anemia in chronic kidney disease: Secondary | ICD-10-CM | POA: Diagnosis not present

## 2019-08-07 DIAGNOSIS — Z23 Encounter for immunization: Secondary | ICD-10-CM | POA: Diagnosis not present

## 2019-08-10 ENCOUNTER — Other Ambulatory Visit: Payer: Self-pay

## 2019-08-10 ENCOUNTER — Other Ambulatory Visit: Payer: Medicare Other

## 2019-08-10 DIAGNOSIS — Z23 Encounter for immunization: Secondary | ICD-10-CM | POA: Diagnosis not present

## 2019-08-10 DIAGNOSIS — D509 Iron deficiency anemia, unspecified: Secondary | ICD-10-CM | POA: Diagnosis not present

## 2019-08-10 DIAGNOSIS — N2581 Secondary hyperparathyroidism of renal origin: Secondary | ICD-10-CM | POA: Diagnosis not present

## 2019-08-10 DIAGNOSIS — E039 Hypothyroidism, unspecified: Secondary | ICD-10-CM

## 2019-08-10 DIAGNOSIS — E8779 Other fluid overload: Secondary | ICD-10-CM | POA: Diagnosis not present

## 2019-08-10 DIAGNOSIS — N186 End stage renal disease: Secondary | ICD-10-CM | POA: Diagnosis not present

## 2019-08-10 DIAGNOSIS — D631 Anemia in chronic kidney disease: Secondary | ICD-10-CM | POA: Diagnosis not present

## 2019-08-10 DIAGNOSIS — D510 Vitamin B12 deficiency anemia due to intrinsic factor deficiency: Secondary | ICD-10-CM | POA: Diagnosis not present

## 2019-08-11 LAB — TSH: TSH: 11.5 u[IU]/mL — ABNORMAL HIGH (ref 0.450–4.500)

## 2019-08-12 DIAGNOSIS — E8779 Other fluid overload: Secondary | ICD-10-CM | POA: Diagnosis not present

## 2019-08-12 DIAGNOSIS — D631 Anemia in chronic kidney disease: Secondary | ICD-10-CM | POA: Diagnosis not present

## 2019-08-12 DIAGNOSIS — D509 Iron deficiency anemia, unspecified: Secondary | ICD-10-CM | POA: Diagnosis not present

## 2019-08-12 DIAGNOSIS — E1139 Type 2 diabetes mellitus with other diabetic ophthalmic complication: Secondary | ICD-10-CM | POA: Diagnosis not present

## 2019-08-12 DIAGNOSIS — Z23 Encounter for immunization: Secondary | ICD-10-CM | POA: Diagnosis not present

## 2019-08-12 DIAGNOSIS — N186 End stage renal disease: Secondary | ICD-10-CM | POA: Diagnosis not present

## 2019-08-12 DIAGNOSIS — N2581 Secondary hyperparathyroidism of renal origin: Secondary | ICD-10-CM | POA: Diagnosis not present

## 2019-08-13 MED ORDER — LEVOTHYROXINE SODIUM 137 MCG PO TABS: 137.0000 ug | ORAL_TABLET | Freq: Every day | ORAL | 1 refills | Status: DC

## 2019-08-14 DIAGNOSIS — D509 Iron deficiency anemia, unspecified: Secondary | ICD-10-CM | POA: Diagnosis not present

## 2019-08-14 DIAGNOSIS — N2581 Secondary hyperparathyroidism of renal origin: Secondary | ICD-10-CM | POA: Diagnosis not present

## 2019-08-14 DIAGNOSIS — E8779 Other fluid overload: Secondary | ICD-10-CM | POA: Diagnosis not present

## 2019-08-14 DIAGNOSIS — D631 Anemia in chronic kidney disease: Secondary | ICD-10-CM | POA: Diagnosis not present

## 2019-08-14 DIAGNOSIS — Z23 Encounter for immunization: Secondary | ICD-10-CM | POA: Diagnosis not present

## 2019-08-14 DIAGNOSIS — N186 End stage renal disease: Secondary | ICD-10-CM | POA: Diagnosis not present

## 2019-08-15 ENCOUNTER — Other Ambulatory Visit: Payer: Self-pay | Admitting: Family Medicine

## 2019-08-16 ENCOUNTER — Other Ambulatory Visit: Payer: Self-pay | Admitting: Family Medicine

## 2019-08-16 ENCOUNTER — Encounter: Payer: Self-pay | Admitting: Family Medicine

## 2019-08-17 ENCOUNTER — Other Ambulatory Visit: Payer: Medicare Other

## 2019-08-17 DIAGNOSIS — E8779 Other fluid overload: Secondary | ICD-10-CM | POA: Diagnosis not present

## 2019-08-17 DIAGNOSIS — N2581 Secondary hyperparathyroidism of renal origin: Secondary | ICD-10-CM | POA: Diagnosis not present

## 2019-08-17 DIAGNOSIS — N186 End stage renal disease: Secondary | ICD-10-CM | POA: Diagnosis not present

## 2019-08-17 DIAGNOSIS — Z23 Encounter for immunization: Secondary | ICD-10-CM | POA: Diagnosis not present

## 2019-08-17 DIAGNOSIS — D631 Anemia in chronic kidney disease: Secondary | ICD-10-CM | POA: Diagnosis not present

## 2019-08-17 DIAGNOSIS — D509 Iron deficiency anemia, unspecified: Secondary | ICD-10-CM | POA: Diagnosis not present

## 2019-08-18 DIAGNOSIS — D510 Vitamin B12 deficiency anemia due to intrinsic factor deficiency: Secondary | ICD-10-CM | POA: Diagnosis not present

## 2019-08-19 DIAGNOSIS — D509 Iron deficiency anemia, unspecified: Secondary | ICD-10-CM | POA: Diagnosis not present

## 2019-08-19 DIAGNOSIS — Z23 Encounter for immunization: Secondary | ICD-10-CM | POA: Diagnosis not present

## 2019-08-19 DIAGNOSIS — N186 End stage renal disease: Secondary | ICD-10-CM | POA: Diagnosis not present

## 2019-08-19 DIAGNOSIS — D631 Anemia in chronic kidney disease: Secondary | ICD-10-CM | POA: Diagnosis not present

## 2019-08-19 DIAGNOSIS — N2581 Secondary hyperparathyroidism of renal origin: Secondary | ICD-10-CM | POA: Diagnosis not present

## 2019-08-19 DIAGNOSIS — E8779 Other fluid overload: Secondary | ICD-10-CM | POA: Diagnosis not present

## 2019-08-21 DIAGNOSIS — N186 End stage renal disease: Secondary | ICD-10-CM | POA: Diagnosis not present

## 2019-08-21 DIAGNOSIS — N2581 Secondary hyperparathyroidism of renal origin: Secondary | ICD-10-CM | POA: Diagnosis not present

## 2019-08-21 DIAGNOSIS — D631 Anemia in chronic kidney disease: Secondary | ICD-10-CM | POA: Diagnosis not present

## 2019-08-21 DIAGNOSIS — Z23 Encounter for immunization: Secondary | ICD-10-CM | POA: Diagnosis not present

## 2019-08-21 DIAGNOSIS — E8779 Other fluid overload: Secondary | ICD-10-CM | POA: Diagnosis not present

## 2019-08-21 DIAGNOSIS — D509 Iron deficiency anemia, unspecified: Secondary | ICD-10-CM | POA: Diagnosis not present

## 2019-08-23 DIAGNOSIS — N186 End stage renal disease: Secondary | ICD-10-CM | POA: Diagnosis not present

## 2019-08-23 DIAGNOSIS — Z992 Dependence on renal dialysis: Secondary | ICD-10-CM | POA: Diagnosis not present

## 2019-08-23 DIAGNOSIS — E103599 Type 1 diabetes mellitus with proliferative diabetic retinopathy without macular edema, unspecified eye: Secondary | ICD-10-CM | POA: Diagnosis not present

## 2019-08-23 DIAGNOSIS — R600 Localized edema: Secondary | ICD-10-CM | POA: Diagnosis not present

## 2019-08-23 DIAGNOSIS — Z9641 Presence of insulin pump (external) (internal): Secondary | ICD-10-CM | POA: Diagnosis not present

## 2019-08-23 DIAGNOSIS — I120 Hypertensive chronic kidney disease with stage 5 chronic kidney disease or end stage renal disease: Secondary | ICD-10-CM | POA: Diagnosis not present

## 2019-08-23 DIAGNOSIS — E1022 Type 1 diabetes mellitus with diabetic chronic kidney disease: Secondary | ICD-10-CM | POA: Diagnosis not present

## 2019-08-23 DIAGNOSIS — Z794 Long term (current) use of insulin: Secondary | ICD-10-CM | POA: Diagnosis not present

## 2019-08-23 DIAGNOSIS — E785 Hyperlipidemia, unspecified: Secondary | ICD-10-CM | POA: Diagnosis not present

## 2019-08-24 DIAGNOSIS — N2581 Secondary hyperparathyroidism of renal origin: Secondary | ICD-10-CM | POA: Diagnosis not present

## 2019-08-24 DIAGNOSIS — D631 Anemia in chronic kidney disease: Secondary | ICD-10-CM | POA: Diagnosis not present

## 2019-08-24 DIAGNOSIS — Z23 Encounter for immunization: Secondary | ICD-10-CM | POA: Diagnosis not present

## 2019-08-24 DIAGNOSIS — D509 Iron deficiency anemia, unspecified: Secondary | ICD-10-CM | POA: Diagnosis not present

## 2019-08-24 DIAGNOSIS — E8779 Other fluid overload: Secondary | ICD-10-CM | POA: Diagnosis not present

## 2019-08-24 DIAGNOSIS — N186 End stage renal disease: Secondary | ICD-10-CM | POA: Diagnosis not present

## 2019-08-25 ENCOUNTER — Ambulatory Visit: Payer: Medicare Other

## 2019-08-25 ENCOUNTER — Other Ambulatory Visit: Payer: Self-pay

## 2019-08-25 DIAGNOSIS — D510 Vitamin B12 deficiency anemia due to intrinsic factor deficiency: Secondary | ICD-10-CM | POA: Diagnosis not present

## 2019-08-26 DIAGNOSIS — N2581 Secondary hyperparathyroidism of renal origin: Secondary | ICD-10-CM | POA: Diagnosis not present

## 2019-08-26 DIAGNOSIS — N186 End stage renal disease: Secondary | ICD-10-CM | POA: Diagnosis not present

## 2019-08-26 DIAGNOSIS — D631 Anemia in chronic kidney disease: Secondary | ICD-10-CM | POA: Diagnosis not present

## 2019-08-26 DIAGNOSIS — D509 Iron deficiency anemia, unspecified: Secondary | ICD-10-CM | POA: Diagnosis not present

## 2019-08-26 DIAGNOSIS — Z23 Encounter for immunization: Secondary | ICD-10-CM | POA: Diagnosis not present

## 2019-08-26 DIAGNOSIS — E8779 Other fluid overload: Secondary | ICD-10-CM | POA: Diagnosis not present

## 2019-08-27 ENCOUNTER — Other Ambulatory Visit: Payer: Self-pay | Admitting: *Deleted

## 2019-08-27 DIAGNOSIS — N2581 Secondary hyperparathyroidism of renal origin: Secondary | ICD-10-CM | POA: Diagnosis not present

## 2019-08-27 DIAGNOSIS — E8779 Other fluid overload: Secondary | ICD-10-CM | POA: Diagnosis not present

## 2019-08-27 DIAGNOSIS — R630 Anorexia: Secondary | ICD-10-CM

## 2019-08-27 DIAGNOSIS — N186 End stage renal disease: Secondary | ICD-10-CM | POA: Diagnosis not present

## 2019-08-27 DIAGNOSIS — D631 Anemia in chronic kidney disease: Secondary | ICD-10-CM | POA: Diagnosis not present

## 2019-08-27 DIAGNOSIS — Z23 Encounter for immunization: Secondary | ICD-10-CM | POA: Diagnosis not present

## 2019-08-27 DIAGNOSIS — D509 Iron deficiency anemia, unspecified: Secondary | ICD-10-CM | POA: Diagnosis not present

## 2019-08-27 DIAGNOSIS — R634 Abnormal weight loss: Secondary | ICD-10-CM

## 2019-08-27 MED ORDER — MIRTAZAPINE 30 MG PO TABS: 30.0000 mg | ORAL_TABLET | Freq: Every day | ORAL | 3 refills | Status: DC

## 2019-08-27 NOTE — Telephone Encounter (Signed)
Fax from [facility]: Mirtazapine 30 mg Please verify directions If dose has increased to 1 tab at bedtime please send new script

## 2019-08-28 DIAGNOSIS — N186 End stage renal disease: Secondary | ICD-10-CM | POA: Diagnosis not present

## 2019-08-28 DIAGNOSIS — E8779 Other fluid overload: Secondary | ICD-10-CM | POA: Diagnosis not present

## 2019-08-28 DIAGNOSIS — Z23 Encounter for immunization: Secondary | ICD-10-CM | POA: Diagnosis not present

## 2019-08-28 DIAGNOSIS — N2581 Secondary hyperparathyroidism of renal origin: Secondary | ICD-10-CM | POA: Diagnosis not present

## 2019-08-28 DIAGNOSIS — D509 Iron deficiency anemia, unspecified: Secondary | ICD-10-CM | POA: Diagnosis not present

## 2019-08-28 DIAGNOSIS — D631 Anemia in chronic kidney disease: Secondary | ICD-10-CM | POA: Diagnosis not present

## 2019-08-30 ENCOUNTER — Encounter: Payer: Self-pay | Admitting: Internal Medicine

## 2019-08-30 DIAGNOSIS — E1022 Type 1 diabetes mellitus with diabetic chronic kidney disease: Secondary | ICD-10-CM | POA: Diagnosis not present

## 2019-08-30 DIAGNOSIS — N185 Chronic kidney disease, stage 5: Secondary | ICD-10-CM | POA: Diagnosis not present

## 2019-08-31 ENCOUNTER — Ambulatory Visit: Payer: Medicare Other | Admitting: Internal Medicine

## 2019-08-31 DIAGNOSIS — E8779 Other fluid overload: Secondary | ICD-10-CM | POA: Diagnosis not present

## 2019-08-31 DIAGNOSIS — D509 Iron deficiency anemia, unspecified: Secondary | ICD-10-CM | POA: Diagnosis not present

## 2019-08-31 DIAGNOSIS — Z23 Encounter for immunization: Secondary | ICD-10-CM | POA: Diagnosis not present

## 2019-08-31 DIAGNOSIS — N2581 Secondary hyperparathyroidism of renal origin: Secondary | ICD-10-CM | POA: Diagnosis not present

## 2019-08-31 DIAGNOSIS — D631 Anemia in chronic kidney disease: Secondary | ICD-10-CM | POA: Diagnosis not present

## 2019-08-31 DIAGNOSIS — N186 End stage renal disease: Secondary | ICD-10-CM | POA: Diagnosis not present

## 2019-09-01 ENCOUNTER — Ambulatory Visit: Payer: Medicare Other | Admitting: Primary Care

## 2019-09-01 ENCOUNTER — Encounter: Payer: Self-pay | Admitting: Primary Care

## 2019-09-01 ENCOUNTER — Ambulatory Visit (INDEPENDENT_AMBULATORY_CARE_PROVIDER_SITE_OTHER): Payer: Medicare Other

## 2019-09-01 ENCOUNTER — Other Ambulatory Visit: Payer: Self-pay

## 2019-09-01 VITALS — BP 130/70 | HR 77 | Temp 97.3°F | Ht 70.0 in | Wt 190.4 lb

## 2019-09-01 DIAGNOSIS — J90 Pleural effusion, not elsewhere classified: Secondary | ICD-10-CM

## 2019-09-01 DIAGNOSIS — G4733 Obstructive sleep apnea (adult) (pediatric): Secondary | ICD-10-CM

## 2019-09-01 DIAGNOSIS — D510 Vitamin B12 deficiency anemia due to intrinsic factor deficiency: Secondary | ICD-10-CM | POA: Diagnosis not present

## 2019-09-01 NOTE — Patient Instructions (Addendum)
Pleural effusion: Checking chest x-ray today Continue Lasix 40 mg daily  Sleep apnea: Continue to wear CPAP every night for minimum of 4 to 6 hours or more Do not drive if experiencing excessive daytime fatigue or somnolence  Follow-up 3 months for telephone visit with [facility] NP for CPAP compliance check    Pleural Effusion Pleural effusion is an abnormal buildup of fluid in the layers of tissue between the lungs and the inside of the chest (pleural space) The two layers of tissue that line the lungs and the inside of the chest are called pleura. Usually, there is no air in the space between the pleura, only a thin layer of fluid. Some conditions can cause a large amount of fluid to build up, which can cause the lung to collapse if untreated. A pleural effusion is usually caused by another disease that requires treatment. What are the causes? Pleural effusion can be caused by:  Heart failure.  Certain infections, such as pneumonia or tuberculosis.  Cancer.  A blood clot in the lung (pulmonary embolism).  Complications from surgery, such as from open heart surgery.  Liver disease (cirrhosis).  Kidney disease. What are the signs or symptoms? In some cases, pleural effusion may cause no symptoms. If symptoms are present, they may include:  Shortness of breath, especially when lying down.  Chest pain. This may get worse when taking a deep breath.  Fever.  Dry, long-lasting (chronic) cough.  Hiccups.  Rapid breathing. An underlying condition that is causing the pleural effusion (such as heart failure, pneumonia, blood clots, tuberculosis, or cancer) may also cause other symptoms. How is this diagnosed? This condition may be diagnosed based on:  Your symptoms and medical history.  A physical exam.  A chest X-ray.  A procedure to use a needle to remove fluid from the pleural space (thoracentesis). This fluid is tested.  Other imaging studies of the chest, such as  ultrasound or CT scan. How is this treated? Depending on the cause of your condition, treatment may include:  Treating the underlying condition that is causing the effusion. When that condition improves, the effusion will also improve. Examples of treatment for underlying conditions include: ? Antibiotic medicines to treat an infection. ? Diuretics or other heart medicines to treat heart failure.  Thoracentesis.  Placing a thin flexible tube under your skin and into your chest to continuously drain the effusion (indwelling pleural catheter).  Surgery to remove the outer layer of tissue from the pleural space (decortication).  A procedure to put medicine into the chest cavity to seal the pleural space and prevent fluid buildup (pleurodesis).  Chemotherapy and radiation therapy, if you have cancerous (malignant) pleural effusion. These treatments are typically used to treat cancer. They kill certain cells in the body. Follow these instructions at home:  Take over-the-counter and prescription medicines only as told by your health care provider.  Ask your health care provider what activities are safe for you.  Keep track of how long you are able to do mild exercise (such as walking) before you get short of breath. Write down this information to share with your health care provider. Your ability to exercise should improve over time.  Do not use any products that contain nicotine or tobacco, such as cigarettes and e-cigarettes. If you need help quitting, ask your health care provider.  Keep all follow-up visits as told by your health care provider. This is important. Contact a health care provider if:  The amount of time  that you are able to do mild exercise: ? Decreases. ? Does not improve with time.  You have a fever. Get help right away if:  You are short of breath.  You develop chest pain.  You develop a new cough. Summary  Pleural effusion is an abnormal buildup of fluid in  the layers of tissue between the lungs and the inside of the chest.  Pleural effusion can have many causes, including heart failure, pulmonary embolism, infections, or cancer.  Symptoms of pleural effusion can include shortness of breath, chest pain, fever, long-lasting (chronic) cough, hiccups, or rapid breathing.  Diagnosis often involves making images of the chest (such as with ultrasound or X-ray) and removing fluid (thoracentesis) to send for testing.  Treatment for pleural effusion depends on what underlying condition is causing it. This information is not intended to replace advice given to you by your health care provider. Make sure you discuss any questions you have with your health care provider. Document Revised: 01/03/2017 Document Reviewed: 09/26/2016 Elsevier Patient Education  2020 [employer].

## 2019-09-01 NOTE — Assessment & Plan Note (Addendum)
-   Resumed CPAP use last week after stopping for several months d/t sinusitis issues - Sleeping well with use  - Continue CPAP auto titrate 5-15cm h20 - No change to pressure setting today  - FU in 3 months for televisit/compliance check

## 2019-09-01 NOTE — Progress Notes (Signed)
Reviewed and agree with assessment/plan.   Chesley Mires, MD [facility] Pulmonary/Critical Care 09/01/2019, 2:45 PM Pager:  [phone]

## 2019-09-01 NOTE — Progress Notes (Signed)
@Patient  ID: Patrick Brown, male    DOB: [birth year], 49 y.o.   MRN: [id number]  Chief Complaint  Patient presents with  . Follow-up    cpap doing well    Referring provider: Dettinger, Fransisca Kaufmann, MD  HPI: 49 year old male, never smoked.  Past medical history significant for OSA on CPAP, CKD stage 3, non-seasonal allergic rhinitis, HIV disease, anemia.  Patient of Dr. Halford Chessman, last seen on 06/02/2017.  PSG 09/26/2014 showed mild obstructive sleep apnea with- AHI 11.3, SPO2 low 86%.  09/01/2019 Patient presents today for follow-up visit for obstructive sleep apnea.  He has not been seen since 2019. Reports developing cough in June. He had a CXR done by PCP which Small right pleural effusion.  No acute appearing airspace opacity. Treated with Augmentin for sinusitis. Cough improved after starting dialysis 5 weeks ago. Received HD Tues/Thur/Sat. He will eventually transition to home hemodialysis. He has baseline lower extremity edema. He takes 40 lasix at night. Resumed CPAP use 1 week ago. He was off therapy for several months d/t nasal congestion. He reports sleeping better now. He uses full face face. DME [employer].   [facility] download 08/01/2019-08/30/2019 6/30 days used; 17% greater than 4 hours Average usage days used 6 hours 18 minutes Pressure 5-15 cm H2O (11.5 cm H2O-95%) Minimal air leaks AHI 2.1  Allergies  Allergen Reactions  . Sulfa Antibiotics Other (See Comments)    High potassium  . Ramipril Cough  . Versed [Midazolam] Other (See Comments)    "I don't wake up very good or clear it out of my system"    Immunization History  Administered Date(s) Administered  . Hepatitis A, Adult 12/23/2013, 09/05/2014  . Hepatitis B, adult 12/23/2013, 02/01/2014, 09/05/2014  . Influenza Split 11/05/2011  . Influenza,inj,Quad PF,6+ Mos 11/15/2013, 11/28/2014, 11/10/2015, 11/13/2016, 10/27/2017, 10/07/2018  . Influenza-Unspecified 11/12/2012  . Meningococcal Conjugate 10/27/2017  . Meningococcal  Mcv4o 03/26/2016, 10/27/2017  . Moderna SARS-COVID-2 Vaccination 03/23/2019, 04/20/2019  . Pneumococcal Conjugate-13 10/27/2017  . Pneumococcal Polysaccharide-23 06/18/2012, 11/24/2018  . Tdap 11/27/2010    Past Medical History:  Diagnosis Date  . Anemia, iron deficiency On procrit  . CAP (community acquired pneumonia)   . CKD (chronic kidney disease) stage 3, GFR 30-59 ml/min   . Degenerative arthritis   . Depression   . Dyslipidemia   . Gastroesophageal reflux disease   . Gastroparesis diabeticorum ([facility])   . Hematuria, microscopic 10/09   work up negative (Dr. Amalia Hailey)  . HIV positive ([facility])   . 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 Hypothyroidism   . IDDM (insulin dependent diabetes mellitus)    38 years  . Low HDL (under 40)   . Proteinuria   . Retinopathy    x2  . SIRS (systemic inflammatory response syndrome) ([facility])     Tobacco History: Social History   Tobacco Use  Smoking Status Never Smoker  Smokeless Tobacco Never Used   Counseling given: Not Answered   Outpatient Medications Prior to Visit  Medication Sig Dispense Refill  . abacavir-dolutegravir-lamiVUDine (TRIUMEQ) 600-50-300 MG tablet Take 1 tablet by mouth daily. 30 tablet 11  . acetaminophen (TYLENOL) 500 MG tablet Take 1,000 mg by mouth 2 (two) times daily.     Marland Kitchen acyclovir (ZOVIRAX) 400 MG tablet TAKE 1 TABLET (400 MG TOTAL) BY MOUTH 2 (TWO)  TIMES DAILY.    . AgaMatrix Ultra-Thin Lancets MISC Test BS 4 times a day and prn Dx E10.65 400 each 3  . amLODipine (NORVASC) 10 MG tablet Take 1 tablet by mouth daily.    Marland Kitchen aspirin 81 MG chewable tablet Chew 81 mg by mouth every morning.    . cyclobenzaprine (FLEXERIL) 10 MG tablet Take 1 tablet (10 mg total) by mouth 3 (three) times daily  as needed for muscle spasms. 30 tablet 3  . diclofenac sodium (VOLTAREN) 1 % GEL Apply 2 g topically 4 (four) times daily. 350 g 3  . diphenoxylate-atropine (LOMOTIL) 2.5-0.025 MG tablet Take 1 tablet by mouth 4 (four) times daily as needed for diarrhea or loose stools. 60 tablet 2  . DULoxetine (CYMBALTA) 30 MG capsule Take 1 capsule (30 mg total) by mouth daily. Take with the 81m for total of 90mg  90 capsule 3  . DULoxetine (CYMBALTA) 60 MG capsule Take 1 capsule (60 mg total) by mouth daily. Take with 30 mg fot a total of 90mg  90 capsule 3  . esomeprazole (NEXIUM) 40 MG capsule TAKE 1 CAPSULE BY MOUTH EVERY DAY 90 capsule 1  . febuxostat (ULORIC) 40 MG tablet TAKE 1 TABLET BY MOUTH EVERY DAY 90 tablet 0  . ferrous sulfate 325 (65 FE) MG tablet Take 650 mg by mouth daily with breakfast.     . fexofenadine (ALLEGRA ALLERGY) 60 MG tablet Take 1 tablet (60 mg total) by mouth daily. 90 tablet 3  . fluticasone (FLONASE) 50 MCG/ACT nasal spray SPRAY 2 SPRAYS INTO EACH NOSTRIL EVERY DAY 48 mL 1  . furosemide (LASIX) 40 MG tablet Take 80 mg by mouth daily. 80mg  am , 20mg  pm  4  . Glucosamine-Chondroit-Vit C-Mn (GLUCOSAMINE 1500 COMPLEX PO) Take 1 tablet by mouth 2 (two) times daily.     Marland Kitchen glucosamine-chondroitin 500-400 MG tablet Take by mouth.    Marland Kitchen glucose blood (ONETOUCH VERIO) test strip TEST BLOOD SUGAR 4 TIMES DAILY AND AS NEEDED 300 each 4  . hyoscyamine (LEVSIN) 0.125 MG tablet TAKE 1 TABLET (0.125 MG TOTAL) BY MOUTH EVERY 4 (FOUR) HOURS AS NEEDED. 60 tablet 1  . insulin glargine (LANTUS) 100 UNIT/ML injection In the event of insulin pump failure, inject 8 units twice daily    . insulin lispro (HUMALOG) 100 UNIT/ML injection USE 42 UNITS TO 120 UNITS PER PUMP DAILY AS DIRECTED 90 mL 3  . ipratropium (ATROVENT) 0.03 % nasal spray USE 2 SPRAYS IN EACH NOSTRIL 2-3 TIMES DAILY 30 mL 1  . levothyroxine (SYNTHROID) 137 MCG tablet Take 1 tablet (137 mcg total) by mouth daily before breakfast. 60 tablet 1   . lipase/protease/amylase (CREON) 12000-38000 units CPEP capsule Take 2 capsules prior to meals and 1 capsule prior to snacks    . LORazepam (ATIVAN) 0.5 MG tablet Take 1 tablet (0.5 mg total) by mouth daily as needed. 30 tablet 5  . losartan (COZAAR) 100 MG tablet Take 100 mg by mouth daily.    . meclizine (ANTIVERT) 12.5 MG tablet Take 1 tablet (12.5 mg total) by mouth 3 (three) times daily as needed for dizziness. 30 tablet 3  . metoCLOPramide (REGLAN) 5 MG tablet TAKE 1 TABLET BY MOUTH 3 TIMES A DAY BEFORE MEALS 270 tablet 0  . mirtazapine (REMERON) 30 MG tablet Take 1 tablet (30 mg total) by mouth at bedtime. Needs to be seen for further refills. 90 tablet 3  . niacin (NIASPAN) 1000 MG CR tablet TAKE 1 TABLET (1,000  MG TOTAL) BY MOUTH AT BEDTIME. (Patient taking differently: TAKE 1 TABLET (1,500 MG TOTAL) BY MOUTH AT BEDTIME.) 30 tablet 5  . ondansetron (ZOFRAN) 4 MG tablet Take 1 tablet (4 mg total) by mouth every 8 (eight) hours as needed for nausea. 45 tablet 6  . OVER THE COUNTER MEDICATION Take 1 capsule by mouth daily. [name]- probiotic daily    . polyethylene glycol powder (GLYCOLAX/MIRALAX) 17 GM/SCOOP powder Take 17 g by mouth daily as needed. 3350 g 1  . Probiotic Product (MISC INTESTINAL FLORA REGULAT) CAPS Take 1 capsule by mouth every morning.    . promethazine (PHENERGAN) 25 MG suppository Place 1 suppository (25 mg total) rectally every 6 (six) hours as needed for nausea or vomiting. 30 each 0  . promethazine-dextromethorphan (PROMETHAZINE-DM) 6.25-15 MG/5ML syrup Take 5 mLs by mouth 4 (four) times daily as needed for cough. 118 mL 0  . sevelamer carbonate (RENVELA) 800 MG tablet Take 800 mg by mouth 3 (three) times daily.    . simvastatin (ZOCOR) 40 MG tablet TAKE 1 TABLET BY MOUTH EVERYDAY AT BEDTIME 90 tablet 1  . sodium bicarbonate 650 MG tablet Take by mouth.    . tamsulosin (FLOMAX) 0.4 MG CAPS capsule Take 0.4 mg by mouth daily.    . Testosterone 20.25 MG/ACT (1.62%)  GEL APPLY 3 PUMPS DAILY AS DIRECTED 75 g 2  . traZODone (DESYREL) 50 MG tablet Take by mouth.    Marland Kitchen VASCEPA 1 g capsule TAKE 2 CAPSULES (2 G TOTAL) BY MOUTH 2 (TWO) TIMES DAILY. 120 capsule 3  . fludrocortisone (FLORINEF) 0.1 MG tablet Take 0.1 mg by mouth daily.    . cetirizine (ZYRTEC) 10 MG tablet TAKE 1 TABLET BY MOUTH EVERY DAY 90 tablet 0   No facility-administered medications prior to visit.    Review of Systems  Review of Systems  Constitutional: Negative.   Respiratory: Negative for cough, chest tightness, shortness of breath and wheezing.   Cardiovascular: Positive for leg swelling.  Psychiatric/Behavioral: Negative for sleep disturbance.     Physical Exam  BP (!) 130/70 (BP Location: Left Arm, Cuff Size: Normal)   Pulse 77   Temp (!) 97.3 F (36.3 C) (Oral)   Ht 5\' 10"  (1.778 m)   Wt 190 lb 6.4 oz (86.4 kg)   SpO2 99%   BMI 27.32 kg/m  Physical Exam Constitutional:      Appearance: Normal appearance.  HENT:     Head: Normocephalic and atraumatic.     Mouth/Throat:     Mouth: Mucous membranes are moist.     Pharynx: Oropharynx is clear.  Cardiovascular:     Rate and Rhythm: Normal rate and regular rhythm.     Comments: +BLE pedal/ankle edema Pulmonary:     Effort: Pulmonary effort is normal.     Breath sounds: Normal breath sounds. No wheezing or rhonchi.  Neurological:     General: No focal deficit present.     Mental Status: He is alert and oriented to person, place, and time. Mental status is at baseline.  Psychiatric:        Mood and Affect: Mood normal.        Behavior: Behavior normal.        Thought Content: Thought content normal.        Judgment: Judgment normal.      Lab Results:  CBC    Component Value Date/Time   WBC 8.7 07/06/2019 0815   WBC 13.4 (H) 05/14/2016 0521   RBC  2.45 (LL) 07/06/2019 0815   RBC 2.94 (L) 05/14/2016 0521   HGB 8.2 (LL) 07/06/2019 0815   HGB 12.7 (L) 07/16/2005 0816   HCT 24.6 (L) 07/06/2019 0815   HCT  37.0 (L) 07/16/2005 0816   PLT 221 07/06/2019 0815   MCV 100 (H) 07/06/2019 0815   MCV 80.0 (L) 07/16/2005 0816   MCH 33.5 (H) 07/06/2019 0815   MCH 32.7 05/14/2016 0521   MCHC 33.3 07/06/2019 0815   MCHC 34.4 05/14/2016 0521   RDW 15.0 07/06/2019 0815   RDW 13.8 07/16/2005 0816   LYMPHSABS 2.1 07/06/2019 0815   LYMPHSABS 3.2 07/16/2005 0816   MONOABS 1.3 (H) 05/10/2016 0746   MONOABS 1.0 (H) 07/16/2005 0816   EOSABS 1.1 (H) 07/06/2019 0815   BASOSABS 0.1 07/06/2019 0815   BASOSABS 0.0 07/16/2005 0816    BMET    Component Value Date/Time   NA 137 07/06/2019 0815   K 4.2 07/06/2019 0815   CL 103 07/06/2019 0815   CO2 17 (L) 07/06/2019 0815   GLUCOSE 89 07/06/2019 0815   GLUCOSE 231 (H) 05/14/2016 0521   BUN 63 (H) 07/06/2019 0815   CREATININE 5.65 (HH) 07/06/2019 0815   CREATININE 2.40 (H) 09/05/2014 1637   CALCIUM 8.4 (L) 07/06/2019 0815   CALCIUM 10.1 08/10/2012 1143   GFRNONAA 11 (L) 07/06/2019 0815   GFRNONAA 31 (L) 09/05/2014 1637   GFRAA 12 (L) 07/06/2019 0815   GFRAA 36 (L) 09/05/2014 1637    BNP No results found for: BNP  ProBNP    Component Value Date/Time   PROBNP 94.3 04/16/2012 2220    Imaging: DG Chest 2 View  Result Date: 09/01/2019 CLINICAL DATA:  HIV disease with central catheter placement EXAM: CHEST - 2 VIEW COMPARISON:  July 08, 2019 FINDINGS: Central catheter tip is in the superior vena cava near the cavoatrial junction. No pneumothorax. The lungs are clear. No appreciable pleural effusion. Heart size and pulmonary vascularity are normal. No adenopathy. No bone lesions. IMPRESSION: Central catheter tip in superior vena cava near cavoatrial junction. Lungs clear. No pleural effusion. Cardiac silhouette within normal limits. Electronically Signed   By: Lowella Grip III M.D.   On: 09/01/2019 12:50     Assessment & Plan:   OSA (obstructive sleep apnea) - Resumed CPAP use last week after stopping for several months d/t sinusitis issues -  Sleeping well with use  - Continue CPAP auto titrate 5-15cm h20 - No change to pressure setting today  - FU in 3 months for televisit/compliance check   Pleural effusion - Cough has resolved. No other respiratory complaints - CXR in June showed small right pleural effusion - Continue Lasix 40mg  daily and HD tues/thur/sat - Repeat CXR today showed clear lungs with no pleural effusion   Martyn Ehrich, NP 09/01/2019

## 2019-09-01 NOTE — Progress Notes (Signed)
Please let patient know CXR showed clear lungs. Pleural effusion has resolved.

## 2019-09-01 NOTE — Assessment & Plan Note (Addendum)
-   Cough has resolved. No other respiratory complaints - CXR in June showed small right pleural effusion - Continue Lasix 40mg  daily and HD tues/thur/sat - Repeat CXR today showed clear lungs with no pleural effusion

## 2019-09-02 DIAGNOSIS — Z23 Encounter for immunization: Secondary | ICD-10-CM | POA: Diagnosis not present

## 2019-09-02 DIAGNOSIS — N2581 Secondary hyperparathyroidism of renal origin: Secondary | ICD-10-CM | POA: Diagnosis not present

## 2019-09-02 DIAGNOSIS — D631 Anemia in chronic kidney disease: Secondary | ICD-10-CM | POA: Diagnosis not present

## 2019-09-02 DIAGNOSIS — E8779 Other fluid overload: Secondary | ICD-10-CM | POA: Diagnosis not present

## 2019-09-02 DIAGNOSIS — D509 Iron deficiency anemia, unspecified: Secondary | ICD-10-CM | POA: Diagnosis not present

## 2019-09-02 DIAGNOSIS — E109 Type 1 diabetes mellitus without complications: Secondary | ICD-10-CM | POA: Diagnosis not present

## 2019-09-02 DIAGNOSIS — N186 End stage renal disease: Secondary | ICD-10-CM | POA: Diagnosis not present

## 2019-09-04 DIAGNOSIS — N186 End stage renal disease: Secondary | ICD-10-CM | POA: Diagnosis not present

## 2019-09-04 DIAGNOSIS — E8779 Other fluid overload: Secondary | ICD-10-CM | POA: Diagnosis not present

## 2019-09-04 DIAGNOSIS — Z23 Encounter for immunization: Secondary | ICD-10-CM | POA: Diagnosis not present

## 2019-09-04 DIAGNOSIS — Z992 Dependence on renal dialysis: Secondary | ICD-10-CM | POA: Diagnosis not present

## 2019-09-04 DIAGNOSIS — N2581 Secondary hyperparathyroidism of renal origin: Secondary | ICD-10-CM | POA: Diagnosis not present

## 2019-09-04 DIAGNOSIS — D509 Iron deficiency anemia, unspecified: Secondary | ICD-10-CM | POA: Diagnosis not present

## 2019-09-04 DIAGNOSIS — D631 Anemia in chronic kidney disease: Secondary | ICD-10-CM | POA: Diagnosis not present

## 2019-09-07 ENCOUNTER — Other Ambulatory Visit: Payer: Self-pay | Admitting: Family Medicine

## 2019-09-07 DIAGNOSIS — D509 Iron deficiency anemia, unspecified: Secondary | ICD-10-CM | POA: Diagnosis not present

## 2019-09-07 DIAGNOSIS — N186 End stage renal disease: Secondary | ICD-10-CM | POA: Diagnosis not present

## 2019-09-07 DIAGNOSIS — N2581 Secondary hyperparathyroidism of renal origin: Secondary | ICD-10-CM | POA: Diagnosis not present

## 2019-09-07 DIAGNOSIS — E8779 Other fluid overload: Secondary | ICD-10-CM | POA: Diagnosis not present

## 2019-09-07 DIAGNOSIS — D631 Anemia in chronic kidney disease: Secondary | ICD-10-CM | POA: Diagnosis not present

## 2019-09-07 DIAGNOSIS — Z23 Encounter for immunization: Secondary | ICD-10-CM | POA: Diagnosis not present

## 2019-09-08 ENCOUNTER — Other Ambulatory Visit: Payer: Self-pay

## 2019-09-08 ENCOUNTER — Ambulatory Visit: Payer: Medicare Other | Admitting: Internal Medicine

## 2019-09-08 ENCOUNTER — Encounter: Payer: Self-pay | Admitting: Internal Medicine

## 2019-09-08 VITALS — BP 196/94 | HR 80 | Wt 198.0 lb

## 2019-09-08 DIAGNOSIS — N186 End stage renal disease: Secondary | ICD-10-CM

## 2019-09-08 DIAGNOSIS — Z992 Dependence on renal dialysis: Secondary | ICD-10-CM

## 2019-09-08 DIAGNOSIS — B20 Human immunodeficiency virus [HIV] disease: Secondary | ICD-10-CM | POA: Diagnosis not present

## 2019-09-08 DIAGNOSIS — D510 Vitamin B12 deficiency anemia due to intrinsic factor deficiency: Secondary | ICD-10-CM | POA: Diagnosis not present

## 2019-09-08 DIAGNOSIS — Z113 Encounter for screening for infections with a predominantly sexual mode of transmission: Secondary | ICD-10-CM

## 2019-09-08 MED ORDER — BICTEGRAVIR-EMTRICITAB-TENOFOV 50-200-25 MG PO TABS: 1.0000 | ORAL_TABLET | Freq: Every day | ORAL | 11 refills | Status: DC

## 2019-09-08 NOTE — Progress Notes (Signed)
   Subjective:    Patient ID: Sheldon Sem, male    DOB: [birth year], 49 y.o.   MRN: [id number]  HPI Here for follow up of HIV He continues on Triumeq and no missed doses.  He has had significant decline in his renal function and now is on IHD.  He says he feels much better, more energy.  No new complaints otherwise.  Had his COVID vaccine.       Review of Systems  Constitutional: Negative for fatigue.  Gastrointestinal: Negative for diarrhea and nausea.  Skin: Negative for rash.       Objective:   Physical Exam Eyes:     General: No scleral icterus. Cardiovascular:     Rate and Rhythm: Normal rate and regular rhythm.  Pulmonary:     Effort: Pulmonary effort is normal.  Neurological:     Mental Status: He is alert.  Psychiatric:        Mood and Affect: Mood normal.   SH: no tobacco        Assessment & Plan:

## 2019-09-09 DIAGNOSIS — D631 Anemia in chronic kidney disease: Secondary | ICD-10-CM | POA: Diagnosis not present

## 2019-09-09 DIAGNOSIS — N186 End stage renal disease: Secondary | ICD-10-CM | POA: Insufficient documentation

## 2019-09-09 DIAGNOSIS — E1139 Type 2 diabetes mellitus with other diabetic ophthalmic complication: Secondary | ICD-10-CM | POA: Diagnosis not present

## 2019-09-09 DIAGNOSIS — Z23 Encounter for immunization: Secondary | ICD-10-CM | POA: Diagnosis not present

## 2019-09-09 DIAGNOSIS — D509 Iron deficiency anemia, unspecified: Secondary | ICD-10-CM | POA: Diagnosis not present

## 2019-09-09 DIAGNOSIS — E8779 Other fluid overload: Secondary | ICD-10-CM | POA: Diagnosis not present

## 2019-09-09 DIAGNOSIS — Z992 Dependence on renal dialysis: Secondary | ICD-10-CM | POA: Insufficient documentation

## 2019-09-09 DIAGNOSIS — N2581 Secondary hyperparathyroidism of renal origin: Secondary | ICD-10-CM | POA: Diagnosis not present

## 2019-09-09 LAB — T-HELPER CELL (CD4) - (RCID CLINIC ONLY)
CD4 % Helper T Cell: 20 % — ABNORMAL LOW (ref 33–65)
CD4 T Cell Abs: 408 /uL (ref 400–1790)

## 2019-09-09 NOTE — Assessment & Plan Note (Signed)
He has continued to do well and at this time, with his ESRD and on IHD, I will change him to biktarvy.  He will return in October and will recheck him on the new medication and then he can go back to yearly follow up if he is doing well.

## 2019-09-09 NOTE — Assessment & Plan Note (Signed)
He is now on dialysis and changes to his ARVs as above.  Fistula has been placed on his left arm radial site.

## 2019-09-11 DIAGNOSIS — Z23 Encounter for immunization: Secondary | ICD-10-CM | POA: Diagnosis not present

## 2019-09-11 DIAGNOSIS — E8779 Other fluid overload: Secondary | ICD-10-CM | POA: Diagnosis not present

## 2019-09-11 DIAGNOSIS — D631 Anemia in chronic kidney disease: Secondary | ICD-10-CM | POA: Diagnosis not present

## 2019-09-11 DIAGNOSIS — N186 End stage renal disease: Secondary | ICD-10-CM | POA: Diagnosis not present

## 2019-09-11 DIAGNOSIS — D509 Iron deficiency anemia, unspecified: Secondary | ICD-10-CM | POA: Diagnosis not present

## 2019-09-11 DIAGNOSIS — N2581 Secondary hyperparathyroidism of renal origin: Secondary | ICD-10-CM | POA: Diagnosis not present

## 2019-09-12 ENCOUNTER — Encounter: Payer: Self-pay | Admitting: Family Medicine

## 2019-09-13 DIAGNOSIS — N2581 Secondary hyperparathyroidism of renal origin: Secondary | ICD-10-CM | POA: Diagnosis not present

## 2019-09-13 DIAGNOSIS — N186 End stage renal disease: Secondary | ICD-10-CM | POA: Diagnosis not present

## 2019-09-13 DIAGNOSIS — D509 Iron deficiency anemia, unspecified: Secondary | ICD-10-CM | POA: Diagnosis not present

## 2019-09-13 DIAGNOSIS — D631 Anemia in chronic kidney disease: Secondary | ICD-10-CM | POA: Diagnosis not present

## 2019-09-13 DIAGNOSIS — Z23 Encounter for immunization: Secondary | ICD-10-CM | POA: Diagnosis not present

## 2019-09-13 DIAGNOSIS — E8779 Other fluid overload: Secondary | ICD-10-CM | POA: Diagnosis not present

## 2019-09-13 LAB — HIV-1 RNA QUANT-NO REFLEX-BLD
HIV 1 RNA Quant: <20 Copies/mL
HIV-1 RNA Quant, Log: <1.3 Log cps/mL

## 2019-09-14 DIAGNOSIS — D631 Anemia in chronic kidney disease: Secondary | ICD-10-CM | POA: Diagnosis not present

## 2019-09-14 DIAGNOSIS — N186 End stage renal disease: Secondary | ICD-10-CM | POA: Diagnosis not present

## 2019-09-14 DIAGNOSIS — D509 Iron deficiency anemia, unspecified: Secondary | ICD-10-CM | POA: Diagnosis not present

## 2019-09-14 DIAGNOSIS — N2581 Secondary hyperparathyroidism of renal origin: Secondary | ICD-10-CM | POA: Diagnosis not present

## 2019-09-14 DIAGNOSIS — E8779 Other fluid overload: Secondary | ICD-10-CM | POA: Diagnosis not present

## 2019-09-14 DIAGNOSIS — Z23 Encounter for immunization: Secondary | ICD-10-CM | POA: Diagnosis not present

## 2019-09-16 DIAGNOSIS — Z23 Encounter for immunization: Secondary | ICD-10-CM | POA: Diagnosis not present

## 2019-09-16 DIAGNOSIS — E8779 Other fluid overload: Secondary | ICD-10-CM | POA: Diagnosis not present

## 2019-09-16 DIAGNOSIS — D631 Anemia in chronic kidney disease: Secondary | ICD-10-CM | POA: Diagnosis not present

## 2019-09-16 DIAGNOSIS — N186 End stage renal disease: Secondary | ICD-10-CM | POA: Diagnosis not present

## 2019-09-16 DIAGNOSIS — N2581 Secondary hyperparathyroidism of renal origin: Secondary | ICD-10-CM | POA: Diagnosis not present

## 2019-09-16 DIAGNOSIS — D509 Iron deficiency anemia, unspecified: Secondary | ICD-10-CM | POA: Diagnosis not present

## 2019-09-17 ENCOUNTER — Encounter: Payer: Self-pay | Admitting: Family Medicine

## 2019-09-17 ENCOUNTER — Ambulatory Visit (INDEPENDENT_AMBULATORY_CARE_PROVIDER_SITE_OTHER): Payer: Medicare Other | Admitting: Family Medicine

## 2019-09-17 DIAGNOSIS — J0101 Acute recurrent maxillary sinusitis: Secondary | ICD-10-CM | POA: Diagnosis not present

## 2019-09-17 MED ORDER — AMOXICILLIN-POT CLAVULANATE 875-125 MG PO TABS: 1.0000 | ORAL_TABLET | Freq: Two times a day (BID) | ORAL | 0 refills | Status: DC

## 2019-09-17 NOTE — Progress Notes (Signed)
Virtual Visit via telephone Note  I connected with Patrick Brown on 09/17/19 at 1046 by telephone and verified that I am speaking with the correct person using two identifiers. Patrick Brown is currently located at work and no othe people are currently with her during visit. The provider, Fransisca Kaufmann Job Holtsclaw, MD is located in their office at time of visit.  Call ended at 1054  I discussed the limitations, risks, security and privacy concerns of performing an evaluation and management service by telephone and the availability of in person appointments. I also discussed with the patient that there may be a patient responsible charge related to this service. The patient expressed understanding and agreed to proceed.   History and Present Illness: Patient has 5 days of cough and congestion and sinus infection and denies fevers or chills.  He has a headache and congestion and denies any sick contacts.  He denies wheezing. He denies covid exposure  No diagnosis found.  Outpatient Encounter Medications as of 09/17/2019  Medication Sig  . acetaminophen (TYLENOL) 500 MG tablet Take 1,000 mg by mouth 2 (two) times daily.   Marland Kitchen acyclovir (ZOVIRAX) 400 MG tablet TAKE 1 TABLET (400 MG TOTAL) BY MOUTH 2 (TWO) TIMES DAILY.  . AgaMatrix Ultra-Thin Lancets MISC Test BS 4 times a day and prn Dx E10.65  . amLODipine (NORVASC) 10 MG tablet Take 1 tablet by mouth daily.  Marland Kitchen aspirin 81 MG chewable tablet Chew 81 mg by mouth every morning.  . bictegravir-emtricitabine-tenofovir AF (BIKTARVY) 50-200-25 MG TABS tablet Take 1 tablet by mouth daily.  . cyclobenzaprine (FLEXERIL) 10 MG tablet Take 1 tablet (10 mg total) by mouth 3 (three) times daily as needed for muscle spasms.  . diclofenac sodium (VOLTAREN) 1 % GEL Apply 2 g topically 4 (four) times daily.  . diphenoxylate-atropine (LOMOTIL) 2.5-0.025 MG tablet Take 1 tablet by mouth 4 (four) times daily as needed for diarrhea or loose stools.  . DULoxetine (CYMBALTA)  30 MG capsule Take 1 capsule (30 mg total) by mouth daily. Take with the 38m for total of 90mg   . DULoxetine (CYMBALTA) 60 MG capsule Take 1 capsule (60 mg total) by mouth daily. Take with 30 mg fot a total of 90mg   . esomeprazole (NEXIUM) 40 MG capsule TAKE 1 CAPSULE BY MOUTH EVERY DAY  . febuxostat (ULORIC) 40 MG tablet TAKE 1 TABLET BY MOUTH EVERY DAY  . ferrous sulfate 325 (65 FE) MG tablet Take 650 mg by mouth daily with breakfast.   . fexofenadine (ALLEGRA ALLERGY) 60 MG tablet Take 1 tablet (60 mg total) by mouth daily.  . fluticasone (FLONASE) 50 MCG/ACT nasal spray SPRAY 2 SPRAYS INTO EACH NOSTRIL EVERY DAY  . furosemide (LASIX) 40 MG tablet Take 80 mg by mouth daily. 80mg  am , 20mg  pm  . Glucosamine-Chondroit-Vit C-Mn (GLUCOSAMINE 1500 COMPLEX PO) Take 1 tablet by mouth 2 (two) times daily.   Marland Kitchen glucosamine-chondroitin 500-400 MG tablet Take by mouth. (Patient not taking: Reported on 09/08/2019)  . glucose blood (ONETOUCH VERIO) test strip TEST BLOOD SUGAR 4 TIMES DAILY AND AS NEEDED  . hyoscyamine (LEVSIN) 0.125 MG tablet TAKE 1 TABLET (0.125 MG TOTAL) BY MOUTH EVERY 4 (FOUR) HOURS AS NEEDED.  Marland Kitchen insulin glargine (LANTUS) 100 UNIT/ML injection In the event of insulin pump failure, inject 8 units twice daily  . insulin lispro (HUMALOG) 100 UNIT/ML injection USE 42 UNITS TO 120 UNITS PER PUMP DAILY AS DIRECTED  . ipratropium (ATROVENT) 0.03 % nasal spray USE 2  SPRAYS IN EACH NOSTRIL 2-3 TIMES DAILY  . levothyroxine (SYNTHROID) 137 MCG tablet Take 1 tablet (137 mcg total) by mouth daily before breakfast.  . lipase/protease/amylase (CREON) 12000-38000 units CPEP capsule Take 2 capsules prior to meals and 1 capsule prior to snacks  . LORazepam (ATIVAN) 0.5 MG tablet Take 1 tablet (0.5 mg total) by mouth daily as needed.  Marland Kitchen losartan (COZAAR) 100 MG tablet Take 100 mg by mouth daily.  . meclizine (ANTIVERT) 12.5 MG tablet Take 1 tablet (12.5 mg total) by mouth 3 (three) times daily as needed for  dizziness.  . metoCLOPramide (REGLAN) 5 MG tablet TAKE 1 TABLET BY MOUTH 3 TIMES A DAY BEFORE MEALS  . mirtazapine (REMERON) 30 MG tablet Take 1 tablet (30 mg total) by mouth at bedtime. Needs to be seen for further refills.  . niacin (NIASPAN) 1000 MG CR tablet TAKE 1 TABLET (1,000 MG TOTAL) BY MOUTH AT BEDTIME.  Marland Kitchen ondansetron (ZOFRAN) 4 MG tablet Take 1 tablet (4 mg total) by mouth every 8 (eight) hours as needed for nausea.  Marland Kitchen OVER THE COUNTER MEDICATION Take 1 capsule by mouth daily. [name]- probiotic daily  . Probiotic Product (MISC INTESTINAL FLORA REGULAT) CAPS Take 1 capsule by mouth every morning.  . promethazine (PHENERGAN) 25 MG suppository Place 1 suppository (25 mg total) rectally every 6 (six) hours as needed for nausea or vomiting. (Patient not taking: Reported on 09/08/2019)  . promethazine-dextromethorphan (PROMETHAZINE-DM) 6.25-15 MG/5ML syrup Take 5 mLs by mouth 4 (four) times daily as needed for cough. (Patient not taking: Reported on 09/08/2019)  . sevelamer carbonate (RENVELA) 800 MG tablet Take 800 mg by mouth 3 (three) times daily.  . simvastatin (ZOCOR) 40 MG tablet TAKE 1 TABLET BY MOUTH EVERYDAY AT BEDTIME  . tamsulosin (FLOMAX) 0.4 MG CAPS capsule Take 0.4 mg by mouth daily.  . Testosterone 20.25 MG/ACT (1.62%) GEL APPLY 3 PUMPS DAILY AS DIRECTED  . traZODone (DESYREL) 50 MG tablet Take by mouth.  Marland Kitchen VASCEPA 1 g capsule TAKE 2 CAPSULES (2 G TOTAL) BY MOUTH 2 (TWO) TIMES DAILY.   No facility-administered encounter medications on file as of 09/17/2019.    Review of Systems  Constitutional: Negative for chills and fever.  HENT: Positive for congestion, postnasal drip, rhinorrhea, sinus pressure, sneezing and sore throat. Negative for ear discharge, ear pain and voice change.   Eyes: Negative for pain, discharge, redness and visual disturbance.  Respiratory: Positive for cough. Negative for shortness of breath and wheezing.   Cardiovascular: Negative for chest pain and leg  swelling.  Musculoskeletal: Negative for gait problem.  Skin: Negative for rash.  Neurological: Positive for headaches. Negative for dizziness, weakness and light-headedness.  All other systems reviewed and are negative.   Observations/Objective: Patient sounds comfortable and in no acute distress  Assessment and Plan: Problem List Items Addressed This Visit    None    Visit Diagnoses    Acute recurrent maxillary sinusitis    -  Primary   Relevant Medications   amoxicillin-clavulanate (AUGMENTIN) 875-125 MG tablet       Follow up plan: Return if symptoms worsen or fail to improve.     I discussed the assessment and treatment plan with the patient. The patient was provided an opportunity to ask questions and all were answered. The patient agreed with the plan and demonstrated an understanding of the instructions.   The patient was advised to call back or seek an in-person evaluation if the symptoms worsen or if the  condition fails to improve as anticipated.  The above assessment and management plan was discussed with the patient. The patient verbalized understanding of and has agreed to the management plan. Patient is aware to call the clinic if symptoms persist or worsen. Patient is aware when to return to the clinic for a follow-up visit. Patient educated on when it is appropriate to go to the emergency department.    I provided 8 minutes of non-face-to-face time during this encounter.    Worthy Rancher, MD

## 2019-09-18 DIAGNOSIS — Z23 Encounter for immunization: Secondary | ICD-10-CM | POA: Diagnosis not present

## 2019-09-18 DIAGNOSIS — E8779 Other fluid overload: Secondary | ICD-10-CM | POA: Diagnosis not present

## 2019-09-18 DIAGNOSIS — D509 Iron deficiency anemia, unspecified: Secondary | ICD-10-CM | POA: Diagnosis not present

## 2019-09-18 DIAGNOSIS — N186 End stage renal disease: Secondary | ICD-10-CM | POA: Diagnosis not present

## 2019-09-18 DIAGNOSIS — D631 Anemia in chronic kidney disease: Secondary | ICD-10-CM | POA: Diagnosis not present

## 2019-09-18 DIAGNOSIS — N2581 Secondary hyperparathyroidism of renal origin: Secondary | ICD-10-CM | POA: Diagnosis not present

## 2019-09-19 ENCOUNTER — Other Ambulatory Visit: Payer: Self-pay | Admitting: Family Medicine

## 2019-09-21 DIAGNOSIS — E8779 Other fluid overload: Secondary | ICD-10-CM | POA: Diagnosis not present

## 2019-09-21 DIAGNOSIS — N186 End stage renal disease: Secondary | ICD-10-CM | POA: Diagnosis not present

## 2019-09-21 DIAGNOSIS — D631 Anemia in chronic kidney disease: Secondary | ICD-10-CM | POA: Diagnosis not present

## 2019-09-21 DIAGNOSIS — D509 Iron deficiency anemia, unspecified: Secondary | ICD-10-CM | POA: Diagnosis not present

## 2019-09-21 DIAGNOSIS — N2581 Secondary hyperparathyroidism of renal origin: Secondary | ICD-10-CM | POA: Diagnosis not present

## 2019-09-21 DIAGNOSIS — Z23 Encounter for immunization: Secondary | ICD-10-CM | POA: Diagnosis not present

## 2019-09-22 DIAGNOSIS — D631 Anemia in chronic kidney disease: Secondary | ICD-10-CM | POA: Diagnosis not present

## 2019-09-22 DIAGNOSIS — N2581 Secondary hyperparathyroidism of renal origin: Secondary | ICD-10-CM | POA: Diagnosis not present

## 2019-09-22 DIAGNOSIS — E8779 Other fluid overload: Secondary | ICD-10-CM | POA: Diagnosis not present

## 2019-09-22 DIAGNOSIS — N186 End stage renal disease: Secondary | ICD-10-CM | POA: Diagnosis not present

## 2019-09-22 DIAGNOSIS — Z23 Encounter for immunization: Secondary | ICD-10-CM | POA: Diagnosis not present

## 2019-09-22 DIAGNOSIS — L720 Epidermal cyst: Secondary | ICD-10-CM | POA: Diagnosis not present

## 2019-09-22 DIAGNOSIS — D509 Iron deficiency anemia, unspecified: Secondary | ICD-10-CM | POA: Diagnosis not present

## 2019-09-23 DIAGNOSIS — D509 Iron deficiency anemia, unspecified: Secondary | ICD-10-CM | POA: Diagnosis not present

## 2019-09-23 DIAGNOSIS — Z23 Encounter for immunization: Secondary | ICD-10-CM | POA: Diagnosis not present

## 2019-09-23 DIAGNOSIS — E8779 Other fluid overload: Secondary | ICD-10-CM | POA: Diagnosis not present

## 2019-09-23 DIAGNOSIS — N186 End stage renal disease: Secondary | ICD-10-CM | POA: Diagnosis not present

## 2019-09-23 DIAGNOSIS — N2581 Secondary hyperparathyroidism of renal origin: Secondary | ICD-10-CM | POA: Diagnosis not present

## 2019-09-23 DIAGNOSIS — D631 Anemia in chronic kidney disease: Secondary | ICD-10-CM | POA: Diagnosis not present

## 2019-09-25 DIAGNOSIS — E8779 Other fluid overload: Secondary | ICD-10-CM | POA: Diagnosis not present

## 2019-09-25 DIAGNOSIS — D631 Anemia in chronic kidney disease: Secondary | ICD-10-CM | POA: Diagnosis not present

## 2019-09-25 DIAGNOSIS — N2581 Secondary hyperparathyroidism of renal origin: Secondary | ICD-10-CM | POA: Diagnosis not present

## 2019-09-25 DIAGNOSIS — N186 End stage renal disease: Secondary | ICD-10-CM | POA: Diagnosis not present

## 2019-09-25 DIAGNOSIS — D509 Iron deficiency anemia, unspecified: Secondary | ICD-10-CM | POA: Diagnosis not present

## 2019-09-25 DIAGNOSIS — Z23 Encounter for immunization: Secondary | ICD-10-CM | POA: Diagnosis not present

## 2019-09-27 DIAGNOSIS — Z23 Encounter for immunization: Secondary | ICD-10-CM | POA: Diagnosis not present

## 2019-09-27 DIAGNOSIS — D631 Anemia in chronic kidney disease: Secondary | ICD-10-CM | POA: Diagnosis not present

## 2019-09-27 DIAGNOSIS — N2581 Secondary hyperparathyroidism of renal origin: Secondary | ICD-10-CM | POA: Diagnosis not present

## 2019-09-27 DIAGNOSIS — N186 End stage renal disease: Secondary | ICD-10-CM | POA: Diagnosis not present

## 2019-09-27 DIAGNOSIS — D509 Iron deficiency anemia, unspecified: Secondary | ICD-10-CM | POA: Diagnosis not present

## 2019-09-27 DIAGNOSIS — E8779 Other fluid overload: Secondary | ICD-10-CM | POA: Diagnosis not present

## 2019-09-28 ENCOUNTER — Encounter: Payer: Self-pay | Admitting: Family Medicine

## 2019-09-28 DIAGNOSIS — Z23 Encounter for immunization: Secondary | ICD-10-CM | POA: Diagnosis not present

## 2019-09-28 DIAGNOSIS — E8779 Other fluid overload: Secondary | ICD-10-CM | POA: Diagnosis not present

## 2019-09-28 DIAGNOSIS — N2581 Secondary hyperparathyroidism of renal origin: Secondary | ICD-10-CM | POA: Diagnosis not present

## 2019-09-28 DIAGNOSIS — N186 End stage renal disease: Secondary | ICD-10-CM | POA: Diagnosis not present

## 2019-09-28 DIAGNOSIS — D631 Anemia in chronic kidney disease: Secondary | ICD-10-CM | POA: Diagnosis not present

## 2019-09-28 DIAGNOSIS — D509 Iron deficiency anemia, unspecified: Secondary | ICD-10-CM | POA: Diagnosis not present

## 2019-09-29 DIAGNOSIS — Z992 Dependence on renal dialysis: Secondary | ICD-10-CM | POA: Insufficient documentation

## 2019-09-29 DIAGNOSIS — N186 End stage renal disease: Secondary | ICD-10-CM | POA: Insufficient documentation

## 2019-09-29 DIAGNOSIS — K8681 Exocrine pancreatic insufficiency: Secondary | ICD-10-CM | POA: Insufficient documentation

## 2019-09-30 DIAGNOSIS — E8779 Other fluid overload: Secondary | ICD-10-CM | POA: Diagnosis not present

## 2019-09-30 DIAGNOSIS — Z23 Encounter for immunization: Secondary | ICD-10-CM | POA: Diagnosis not present

## 2019-09-30 DIAGNOSIS — D631 Anemia in chronic kidney disease: Secondary | ICD-10-CM | POA: Diagnosis not present

## 2019-09-30 DIAGNOSIS — D509 Iron deficiency anemia, unspecified: Secondary | ICD-10-CM | POA: Diagnosis not present

## 2019-09-30 DIAGNOSIS — N2581 Secondary hyperparathyroidism of renal origin: Secondary | ICD-10-CM | POA: Diagnosis not present

## 2019-09-30 DIAGNOSIS — N186 End stage renal disease: Secondary | ICD-10-CM | POA: Diagnosis not present

## 2019-10-01 ENCOUNTER — Other Ambulatory Visit: Payer: Self-pay | Admitting: Family Medicine

## 2019-10-01 DIAGNOSIS — H179 Unspecified corneal scar and opacity: Secondary | ICD-10-CM | POA: Diagnosis not present

## 2019-10-01 DIAGNOSIS — H04121 Dry eye syndrome of right lacrimal gland: Secondary | ICD-10-CM | POA: Diagnosis not present

## 2019-10-01 DIAGNOSIS — Z961 Presence of intraocular lens: Secondary | ICD-10-CM | POA: Diagnosis not present

## 2019-10-02 DIAGNOSIS — N2581 Secondary hyperparathyroidism of renal origin: Secondary | ICD-10-CM | POA: Diagnosis not present

## 2019-10-02 DIAGNOSIS — N186 End stage renal disease: Secondary | ICD-10-CM | POA: Diagnosis not present

## 2019-10-02 DIAGNOSIS — E8779 Other fluid overload: Secondary | ICD-10-CM | POA: Diagnosis not present

## 2019-10-02 DIAGNOSIS — D509 Iron deficiency anemia, unspecified: Secondary | ICD-10-CM | POA: Diagnosis not present

## 2019-10-02 DIAGNOSIS — D631 Anemia in chronic kidney disease: Secondary | ICD-10-CM | POA: Diagnosis not present

## 2019-10-02 DIAGNOSIS — Z23 Encounter for immunization: Secondary | ICD-10-CM | POA: Diagnosis not present

## 2019-10-03 ENCOUNTER — Encounter: Payer: Self-pay | Admitting: Family Medicine

## 2019-10-03 DIAGNOSIS — J0101 Acute recurrent maxillary sinusitis: Secondary | ICD-10-CM

## 2019-10-04 MED ORDER — AMOXICILLIN-POT CLAVULANATE 875-125 MG PO TABS: 1.0000 | ORAL_TABLET | Freq: Two times a day (BID) | ORAL | 0 refills | Status: DC

## 2019-10-05 DIAGNOSIS — D509 Iron deficiency anemia, unspecified: Secondary | ICD-10-CM | POA: Diagnosis not present

## 2019-10-05 DIAGNOSIS — N2581 Secondary hyperparathyroidism of renal origin: Secondary | ICD-10-CM | POA: Diagnosis not present

## 2019-10-05 DIAGNOSIS — Z992 Dependence on renal dialysis: Secondary | ICD-10-CM | POA: Diagnosis not present

## 2019-10-05 DIAGNOSIS — E8779 Other fluid overload: Secondary | ICD-10-CM | POA: Diagnosis not present

## 2019-10-05 DIAGNOSIS — N186 End stage renal disease: Secondary | ICD-10-CM | POA: Diagnosis not present

## 2019-10-05 DIAGNOSIS — Z23 Encounter for immunization: Secondary | ICD-10-CM | POA: Diagnosis not present

## 2019-10-05 DIAGNOSIS — D631 Anemia in chronic kidney disease: Secondary | ICD-10-CM | POA: Diagnosis not present

## 2019-10-06 DIAGNOSIS — E8779 Other fluid overload: Secondary | ICD-10-CM | POA: Diagnosis not present

## 2019-10-06 DIAGNOSIS — N186 End stage renal disease: Secondary | ICD-10-CM | POA: Diagnosis not present

## 2019-10-07 DIAGNOSIS — N186 End stage renal disease: Secondary | ICD-10-CM | POA: Diagnosis not present

## 2019-10-07 DIAGNOSIS — E8779 Other fluid overload: Secondary | ICD-10-CM | POA: Diagnosis not present

## 2019-10-08 ENCOUNTER — Encounter: Payer: Self-pay | Admitting: Family Medicine

## 2019-10-08 ENCOUNTER — Other Ambulatory Visit: Payer: Self-pay

## 2019-10-08 ENCOUNTER — Ambulatory Visit (INDEPENDENT_AMBULATORY_CARE_PROVIDER_SITE_OTHER): Payer: Medicare Other | Admitting: Family Medicine

## 2019-10-08 VITALS — BP 106/71 | HR 76 | Temp 98.2°F | Ht 70.0 in | Wt 197.0 lb

## 2019-10-08 DIAGNOSIS — N1832 Chronic kidney disease, stage 3b: Secondary | ICD-10-CM

## 2019-10-08 DIAGNOSIS — Z992 Dependence on renal dialysis: Secondary | ICD-10-CM

## 2019-10-08 DIAGNOSIS — F419 Anxiety disorder, unspecified: Secondary | ICD-10-CM | POA: Diagnosis not present

## 2019-10-08 DIAGNOSIS — E1021 Type 1 diabetes mellitus with diabetic nephropathy: Secondary | ICD-10-CM | POA: Diagnosis not present

## 2019-10-08 DIAGNOSIS — E8779 Other fluid overload: Secondary | ICD-10-CM | POA: Diagnosis not present

## 2019-10-08 DIAGNOSIS — E039 Hypothyroidism, unspecified: Secondary | ICD-10-CM

## 2019-10-08 DIAGNOSIS — Z23 Encounter for immunization: Secondary | ICD-10-CM

## 2019-10-08 DIAGNOSIS — N186 End stage renal disease: Secondary | ICD-10-CM | POA: Diagnosis not present

## 2019-10-08 LAB — BAYER DCA HB A1C WAIVED: HB A1C (BAYER DCA - WAIVED): 6.7 % (ref <7.0)

## 2019-10-08 MED ORDER — METOCLOPRAMIDE HCL 5 MG PO TABS
5.0000 mg | ORAL_TABLET | Freq: Three times a day (TID) | ORAL | 3 refills | Status: DC
Start: 2019-10-08 — End: 2020-11-01

## 2019-10-08 MED ORDER — PROMETHAZINE HCL 25 MG RE SUPP: 25.0000 mg | Freq: Four times a day (QID) | RECTAL | 0 refills | Status: DC | PRN

## 2019-10-08 MED ORDER — SIMVASTATIN 40 MG PO TABS
40.0000 mg | ORAL_TABLET | Freq: Every day | ORAL | 1 refills | Status: DC
Start: 2019-10-08 — End: 2020-02-09

## 2019-10-08 MED ORDER — LEVOTHYROXINE SODIUM 137 MCG PO TABS
137.0000 ug | ORAL_TABLET | Freq: Every day | ORAL | 1 refills | Status: DC
Start: 2019-10-08 — End: 2019-10-15

## 2019-10-08 NOTE — Progress Notes (Signed)
BP 106/71   Pulse 76   Temp 98.2 F (36.8 C)   Ht 5\' 10"  (1.778 m)   Wt 197 lb (89.4 kg)   BMI 28.27 kg/m    Subjective:   Patient ID: Patrick Brown, male    DOB: [birth year], 49 y.o.   MRN: [id number]  HPI: Patrick Brown is a 49 y.o. male presenting on 10/08/2019 for Medical Management of Chronic Issues and Chronic Kidney Disease   HPI Anxiety recheck Patient is coming in today for anxiety recheck.  He currently takes Cymbalta and Ativan and says that they are both working well denies any issues.  Patient sees an endocrinologist for his diabetes.  Patient has transplant team and a nephrologist for his end-stage renal disease.  Hypothyroidism recheck Patient is coming in for thyroid recheck today as well. They deny any issues with hair changes or heat or cold problems or diarrhea or constipation. They deny any chest pain or palpitations. They are currently on levothyroxine 137 micrograms   Relevant past medical, surgical, family and social history reviewed and updated as indicated. Interim medical history since our last visit reviewed. Allergies and medications reviewed and updated.  Review of Systems  Constitutional: Negative for chills and fever.  Eyes: Negative for visual disturbance.  Respiratory: Negative for shortness of breath and wheezing.   Cardiovascular: Negative for chest pain and leg swelling.  Skin: Negative for rash.  Neurological: Negative for dizziness, weakness and light-headedness.  All other systems reviewed and are negative.   Per HPI unless specifically indicated above   Allergies as of 10/08/2019      Reactions   Sulfa Antibiotics Other (See Comments)   High potassium   Ramipril Cough   Versed [midazolam] Other (See Comments)   "I don't wake up very good or clear it out of my system"      Medication List       Accurate as of October 08, 2019  8:54 AM. If you have any questions, ask your nurse or doctor.        STOP taking these  medications   mirtazapine 30 MG tablet Commonly known as: REMERON Stopped by: Fransisca Kaufmann [place] Defina, MD     TAKE these medications   acetaminophen 500 MG tablet Commonly known as: TYLENOL Take 1,000 mg by mouth 2 (two) times daily.   acyclovir 400 MG tablet Commonly known as: ZOVIRAX TAKE 1 TABLET (400 MG TOTAL) BY MOUTH 2 (TWO) TIMES DAILY.   AgaMatrix Ultra-Thin Lancets Misc Test BS 4 times a day and prn Dx E10.65   amLODipine 10 MG tablet Commonly known as: NORVASC Take 1 tablet by mouth daily.   amoxicillin-clavulanate 875-125 MG tablet Commonly known as: AUGMENTIN Take 1 tablet by mouth 2 (two) times daily.   aspirin 81 MG chewable tablet Chew 81 mg by mouth every morning.   bictegravir-emtricitabine-tenofovir AF 50-200-25 MG Tabs tablet Commonly known as: BIKTARVY Take 1 tablet by mouth daily.   cyclobenzaprine 10 MG tablet Commonly known as: FLEXERIL Take 1 tablet (10 mg total) by mouth 3 (three) times daily as needed for muscle spasms.   diclofenac sodium 1 % Gel Commonly known as: VOLTAREN Apply 2 g topically 4 (four) times daily.   diphenoxylate-atropine 2.5-0.025 MG tablet Commonly known as: Lomotil Take 1 tablet by mouth 4 (four) times daily as needed for diarrhea or loose stools.   DULoxetine 30 MG capsule Commonly known as: Cymbalta Take 1 capsule (30 mg total) by mouth daily. Take with the  66m for total of 90mg    DULoxetine 60 MG capsule Commonly known as: CYMBALTA Take 1 capsule (60 mg total) by mouth daily. Take with 30 mg fot a total of 90mg    esomeprazole 40 MG capsule Commonly known as: NEXIUM TAKE 1 CAPSULE BY MOUTH EVERY DAY   febuxostat 40 MG tablet Commonly known as: ULORIC TAKE 1 TABLET BY MOUTH EVERY DAY   ferrous sulfate 325 (65 FE) MG tablet Take 650 mg by mouth daily with breakfast.   fexofenadine 60 MG tablet Commonly known as: Allegra Allergy Take 1 tablet (60 mg total) by mouth daily.   fluticasone 50 MCG/ACT nasal  spray Commonly known as: FLONASE SPRAY 2 SPRAYS INTO EACH NOSTRIL EVERY DAY   furosemide 40 MG tablet Commonly known as: LASIX Take 80 mg by mouth daily. 80mg  am , 20mg  pm   GLUCOSAMINE 1500 COMPLEX PO Take 1 tablet by mouth 2 (two) times daily.   glucosamine-chondroitin 500-400 MG tablet Take by mouth.   glucose blood test strip Commonly known as: OneTouch Verio TEST BLOOD SUGAR 4 TIMES DAILY AND AS NEEDED   hyoscyamine 0.125 MG tablet Commonly known as: LEVSIN TAKE 1 TABLET (0.125 MG TOTAL) BY MOUTH EVERY 4 (FOUR) HOURS AS NEEDED.   insulin glargine 100 UNIT/ML injection Commonly known as: LANTUS In the event of insulin pump failure, inject 8 units twice daily   insulin lispro 100 UNIT/ML injection Commonly known as: HumaLOG USE 42 UNITS TO 120 UNITS PER PUMP DAILY AS DIRECTED   ipratropium 0.03 % nasal spray Commonly known as: ATROVENT USE 2 SPRAYS IN EACH NOSTRIL 2-3 TIMES DAILY   levothyroxine 137 MCG tablet Commonly known as: SYNTHROID Take 1 tablet (137 mcg total) by mouth daily before breakfast.   lipase/protease/amylase 12000-38000 units Cpep capsule Commonly known as: CREON Take 2 capsules prior to meals and 1 capsule prior to snacks   LORazepam 0.5 MG tablet Commonly known as: ATIVAN Take 1 tablet (0.5 mg total) by mouth daily as needed.   losartan 100 MG tablet Commonly known as: COZAAR Take 100 mg by mouth daily.   meclizine 12.5 MG tablet Commonly known as: ANTIVERT Take 1 tablet (12.5 mg total) by mouth 3 (three) times daily as needed for dizziness.   metoCLOPramide 5 MG tablet Commonly known as: REGLAN Take 1 tablet (5 mg total) by mouth 3 (three) times daily before meals. What changed: See the new instructions. Changed by: Worthy Rancher, MD   Misc Intestinal Flora Regulat Caps Take 1 capsule by mouth every morning.   niacin 1000 MG CR tablet Commonly known as: NIASPAN TAKE 1 TABLET (1,000 MG TOTAL) BY MOUTH AT BEDTIME.    ondansetron 4 MG tablet Commonly known as: ZOFRAN Take 1 tablet (4 mg total) by mouth every 8 (eight) hours as needed for nausea.   OVER THE COUNTER MEDICATION Take 1 capsule by mouth daily. [name]- probiotic daily   promethazine 25 MG suppository Commonly known as: Phenergan Place 1 suppository (25 mg total) rectally every 6 (six) hours as needed for nausea or vomiting.   promethazine-dextromethorphan 6.25-15 MG/5ML syrup Commonly known as: PROMETHAZINE-DM Take 5 mLs by mouth 4 (four) times daily as needed for cough.   sevelamer carbonate 800 MG tablet Commonly known as: RENVELA Take 800 mg by mouth 3 (three) times daily.   simvastatin 40 MG tablet Commonly known as: ZOCOR Take 1 tablet (40 mg total) by mouth daily at 6 PM. What changed: See the new instructions. Changed by: Fransisca Kaufmann  Adalea Handler, MD   tamsulosin 0.4 MG Caps capsule Commonly known as: FLOMAX Take 0.4 mg by mouth daily.   Testosterone 20.25 MG/ACT (1.62%) Gel APPLY 3 PUMPS DAILY AS DIRECTED   traZODone 50 MG tablet Commonly known as: DESYREL Take by mouth.   Vascepa 1 g capsule Generic drug: icosapent Ethyl TAKE 2 CAPSULES (2 G TOTAL) BY MOUTH 2 (TWO) TIMES DAILY.        Objective:   BP 106/71   Pulse 76   Temp 98.2 F (36.8 C)   Ht 5\' 10"  (1.778 m)   Wt 197 lb (89.4 kg)   BMI 28.27 kg/m   Wt Readings from Last 3 Encounters:  10/08/19 197 lb (89.4 kg)  09/08/19 198 lb (89.8 kg)  09/01/19 190 lb 6.4 oz (86.4 kg)    Physical Exam Vitals and nursing note reviewed.  Constitutional:      General: He is not in acute distress.    Appearance: He is well-developed. He is not diaphoretic.  Eyes:     General: No scleral icterus.    Conjunctiva/sclera: Conjunctivae normal.  Neck:     Thyroid: No thyromegaly.  Cardiovascular:     Rate and Rhythm: Normal rate and regular rhythm.     Heart sounds: Normal heart sounds. No murmur heard.   Pulmonary:     Effort: Pulmonary effort is normal. No  respiratory distress.     Breath sounds: Normal breath sounds. No wheezing.  Musculoskeletal:        General: Normal range of motion.     Cervical back: Neck supple.  Lymphadenopathy:     Cervical: No cervical adenopathy.  Skin:    General: Skin is warm and dry.     Findings: No rash.  Neurological:     Mental Status: He is alert and oriented to person, place, and time.     Coordination: Coordination normal.  Psychiatric:        Behavior: Behavior normal.     Diabetic Foot Exam - Simple   Simple Foot Form Diabetic Foot exam was performed with the following findings: Yes 10/08/2019  8:50 AM  Visual Inspection No deformities, no ulcerations, no other skin breakdown bilaterally: Yes Sensation Testing Intact to touch and monofilament testing bilaterally: Yes Pulse Check Posterior Tibialis and Dorsalis pulse intact bilaterally: Yes Comments      Assessment & Plan:   Problem List Items Addressed This Visit      Endocrine   Type 1 diabetes mellitus (HCC)   Relevant Medications   simvastatin (ZOCOR) 40 MG tablet   Other Relevant Orders   Bayer DCA Hb A1c Waived   Acquired hypothyroidism   Relevant Medications   levothyroxine (SYNTHROID) 137 MCG tablet   Other Relevant Orders   TSH     Genitourinary   ESRD (end stage renal disease) on dialysis (HCC) - Primary     Other   Anxiety      Will check patient's thyroid today, he continues to follow with endocrinology.  He continues to follow with his transplant team and is on dialysis.  Patient's anxiety seems to be doing well, no changes. Follow up plan: Return in about 3 months (around 01/07/2020), or if symptoms worsen or fail to improve, for Thyroid recheck and anxiety.  Counseling provided for all of the vaccine components Orders Placed This Encounter  Procedures  . Varicella-zoster vaccine IM (Shingrix)  . Bayer DCA Hb A1c Waived  . TSH    Caryl Pina, MD [facility]  [facility] 10/08/2019, 8:54  AM

## 2019-10-09 LAB — TSH: TSH: 9.25 u[IU]/mL — ABNORMAL HIGH (ref 0.450–4.500)

## 2019-10-10 ENCOUNTER — Other Ambulatory Visit: Payer: Self-pay | Admitting: Family Medicine

## 2019-10-10 DIAGNOSIS — J301 Allergic rhinitis due to pollen: Secondary | ICD-10-CM

## 2019-10-11 DIAGNOSIS — N186 End stage renal disease: Secondary | ICD-10-CM | POA: Diagnosis not present

## 2019-10-11 DIAGNOSIS — E8779 Other fluid overload: Secondary | ICD-10-CM | POA: Diagnosis not present

## 2019-10-12 ENCOUNTER — Encounter: Payer: Self-pay | Admitting: Family Medicine

## 2019-10-12 DIAGNOSIS — N186 End stage renal disease: Secondary | ICD-10-CM | POA: Diagnosis not present

## 2019-10-13 DIAGNOSIS — N184 Chronic kidney disease, stage 4 (severe): Secondary | ICD-10-CM | POA: Diagnosis not present

## 2019-10-13 DIAGNOSIS — D510 Vitamin B12 deficiency anemia due to intrinsic factor deficiency: Secondary | ICD-10-CM | POA: Diagnosis not present

## 2019-10-13 DIAGNOSIS — R3914 Feeling of incomplete bladder emptying: Secondary | ICD-10-CM | POA: Diagnosis not present

## 2019-10-13 DIAGNOSIS — R339 Retention of urine, unspecified: Secondary | ICD-10-CM | POA: Diagnosis not present

## 2019-10-13 DIAGNOSIS — I10 Essential (primary) hypertension: Secondary | ICD-10-CM | POA: Diagnosis not present

## 2019-10-13 MED ORDER — CETIRIZINE HCL 10 MG PO TABS: 10.0000 mg | ORAL_TABLET | Freq: Every day | ORAL | 11 refills | Status: DC

## 2019-10-13 NOTE — Telephone Encounter (Signed)
Not on med list please advise 

## 2019-10-14 DIAGNOSIS — N186 End stage renal disease: Secondary | ICD-10-CM | POA: Diagnosis not present

## 2019-10-15 ENCOUNTER — Telehealth: Payer: Self-pay | Admitting: Family Medicine

## 2019-10-15 MED ORDER — LEVOTHYROXINE SODIUM 150 MCG PO TABS: 150.0000 ug | ORAL_TABLET | Freq: Every day | ORAL | 1 refills | Status: DC

## 2019-10-15 NOTE — Telephone Encounter (Signed)
New dose of 143mcg sent in for pt

## 2019-10-16 DIAGNOSIS — E8779 Other fluid overload: Secondary | ICD-10-CM | POA: Diagnosis not present

## 2019-10-16 DIAGNOSIS — N186 End stage renal disease: Secondary | ICD-10-CM | POA: Diagnosis not present

## 2019-10-18 ENCOUNTER — Other Ambulatory Visit: Payer: Self-pay | Admitting: Family Medicine

## 2019-10-18 DIAGNOSIS — N2581 Secondary hyperparathyroidism of renal origin: Secondary | ICD-10-CM | POA: Diagnosis not present

## 2019-10-18 DIAGNOSIS — Z23 Encounter for immunization: Secondary | ICD-10-CM | POA: Diagnosis not present

## 2019-10-18 DIAGNOSIS — D631 Anemia in chronic kidney disease: Secondary | ICD-10-CM | POA: Diagnosis not present

## 2019-10-18 DIAGNOSIS — N186 End stage renal disease: Secondary | ICD-10-CM | POA: Diagnosis not present

## 2019-10-18 DIAGNOSIS — E1022 Type 1 diabetes mellitus with diabetic chronic kidney disease: Secondary | ICD-10-CM

## 2019-10-18 DIAGNOSIS — IMO0002 Reserved for concepts with insufficient information to code with codable children: Secondary | ICD-10-CM

## 2019-10-19 DIAGNOSIS — N186 End stage renal disease: Secondary | ICD-10-CM | POA: Diagnosis not present

## 2019-10-19 DIAGNOSIS — D631 Anemia in chronic kidney disease: Secondary | ICD-10-CM | POA: Diagnosis not present

## 2019-10-19 DIAGNOSIS — Z23 Encounter for immunization: Secondary | ICD-10-CM | POA: Diagnosis not present

## 2019-10-19 DIAGNOSIS — E119 Type 2 diabetes mellitus without complications: Secondary | ICD-10-CM | POA: Diagnosis not present

## 2019-10-19 DIAGNOSIS — N2581 Secondary hyperparathyroidism of renal origin: Secondary | ICD-10-CM | POA: Diagnosis not present

## 2019-10-19 DIAGNOSIS — Z4931 Encounter for adequacy testing for hemodialysis: Secondary | ICD-10-CM | POA: Diagnosis not present

## 2019-10-21 DIAGNOSIS — N2581 Secondary hyperparathyroidism of renal origin: Secondary | ICD-10-CM | POA: Diagnosis not present

## 2019-10-21 DIAGNOSIS — N186 End stage renal disease: Secondary | ICD-10-CM | POA: Diagnosis not present

## 2019-10-21 DIAGNOSIS — Z23 Encounter for immunization: Secondary | ICD-10-CM | POA: Diagnosis not present

## 2019-10-21 DIAGNOSIS — D631 Anemia in chronic kidney disease: Secondary | ICD-10-CM | POA: Diagnosis not present

## 2019-10-22 DIAGNOSIS — D631 Anemia in chronic kidney disease: Secondary | ICD-10-CM | POA: Diagnosis not present

## 2019-10-22 DIAGNOSIS — N2581 Secondary hyperparathyroidism of renal origin: Secondary | ICD-10-CM | POA: Diagnosis not present

## 2019-10-22 DIAGNOSIS — N186 End stage renal disease: Secondary | ICD-10-CM | POA: Diagnosis not present

## 2019-10-22 DIAGNOSIS — Z23 Encounter for immunization: Secondary | ICD-10-CM | POA: Diagnosis not present

## 2019-10-25 DIAGNOSIS — N2581 Secondary hyperparathyroidism of renal origin: Secondary | ICD-10-CM | POA: Diagnosis not present

## 2019-10-25 DIAGNOSIS — D631 Anemia in chronic kidney disease: Secondary | ICD-10-CM | POA: Diagnosis not present

## 2019-10-25 DIAGNOSIS — N186 End stage renal disease: Secondary | ICD-10-CM | POA: Diagnosis not present

## 2019-10-25 DIAGNOSIS — Z23 Encounter for immunization: Secondary | ICD-10-CM | POA: Diagnosis not present

## 2019-10-26 DIAGNOSIS — N186 End stage renal disease: Secondary | ICD-10-CM | POA: Diagnosis not present

## 2019-10-26 DIAGNOSIS — Z992 Dependence on renal dialysis: Secondary | ICD-10-CM | POA: Diagnosis not present

## 2019-10-27 DIAGNOSIS — Z992 Dependence on renal dialysis: Secondary | ICD-10-CM | POA: Diagnosis not present

## 2019-10-27 DIAGNOSIS — N186 End stage renal disease: Secondary | ICD-10-CM | POA: Diagnosis not present

## 2019-10-28 DIAGNOSIS — N186 End stage renal disease: Secondary | ICD-10-CM | POA: Diagnosis not present

## 2019-10-28 DIAGNOSIS — Z23 Encounter for immunization: Secondary | ICD-10-CM | POA: Diagnosis not present

## 2019-10-28 DIAGNOSIS — D631 Anemia in chronic kidney disease: Secondary | ICD-10-CM | POA: Diagnosis not present

## 2019-10-28 DIAGNOSIS — N2581 Secondary hyperparathyroidism of renal origin: Secondary | ICD-10-CM | POA: Diagnosis not present

## 2019-10-29 DIAGNOSIS — N186 End stage renal disease: Secondary | ICD-10-CM | POA: Diagnosis not present

## 2019-10-29 DIAGNOSIS — N2581 Secondary hyperparathyroidism of renal origin: Secondary | ICD-10-CM | POA: Diagnosis not present

## 2019-10-29 DIAGNOSIS — D631 Anemia in chronic kidney disease: Secondary | ICD-10-CM | POA: Diagnosis not present

## 2019-10-29 DIAGNOSIS — Z23 Encounter for immunization: Secondary | ICD-10-CM | POA: Diagnosis not present

## 2019-11-01 ENCOUNTER — Encounter (INDEPENDENT_AMBULATORY_CARE_PROVIDER_SITE_OTHER): Payer: Self-pay | Admitting: Ophthalmology

## 2019-11-01 ENCOUNTER — Other Ambulatory Visit: Payer: Self-pay

## 2019-11-01 ENCOUNTER — Ambulatory Visit (INDEPENDENT_AMBULATORY_CARE_PROVIDER_SITE_OTHER): Payer: Medicare Other | Admitting: Ophthalmology

## 2019-11-01 DIAGNOSIS — Z961 Presence of intraocular lens: Secondary | ICD-10-CM | POA: Diagnosis not present

## 2019-11-01 DIAGNOSIS — Z23 Encounter for immunization: Secondary | ICD-10-CM | POA: Diagnosis not present

## 2019-11-01 DIAGNOSIS — E103551 Type 1 diabetes mellitus with stable proliferative diabetic retinopathy, right eye: Secondary | ICD-10-CM | POA: Diagnosis not present

## 2019-11-01 DIAGNOSIS — E103511 Type 1 diabetes mellitus with proliferative diabetic retinopathy with macular edema, right eye: Secondary | ICD-10-CM

## 2019-11-01 DIAGNOSIS — D631 Anemia in chronic kidney disease: Secondary | ICD-10-CM | POA: Diagnosis not present

## 2019-11-01 DIAGNOSIS — E103512 Type 1 diabetes mellitus with proliferative diabetic retinopathy with macular edema, left eye: Secondary | ICD-10-CM | POA: Diagnosis not present

## 2019-11-01 DIAGNOSIS — E103552 Type 1 diabetes mellitus with stable proliferative diabetic retinopathy, left eye: Secondary | ICD-10-CM

## 2019-11-01 DIAGNOSIS — N186 End stage renal disease: Secondary | ICD-10-CM | POA: Diagnosis not present

## 2019-11-01 DIAGNOSIS — N2581 Secondary hyperparathyroidism of renal origin: Secondary | ICD-10-CM | POA: Diagnosis not present

## 2019-11-01 NOTE — Patient Instructions (Addendum)
And asked to report promptly if new visual acuity declines distortions or haziness Diabetic Retinopathy Diabetic retinopathy is a disease of the retina. The retina is a light-sensitive membrane at the back of the eye. Retinopathy is a complication of diabetes (diabetes mellitus) and a common cause of bad eyesight (visual impairment). It can eventually cause blindness. Early detection and treatment of diabetic retinopathy is important in keeping your eyes healthy and preventing further damage to them. What are the causes? Diabetic retinopathy is caused by blood sugar (glucose) levels that are too high for an extended period of time. High blood glucose over an extended period of time can:  Damage small blood vessels in the retina, allowing blood to leak through the vessel walls.  Cause new, abnormal blood vessels to grow on the retina. This can scar the retina in the advanced stage of diabetic retinopathy. What increases the risk? You are more likely to develop this condition if:  You have had diabetes for a long time.  You have poorly controlled blood glucose.  You have high blood pressure. What are the signs or symptoms? In the early stages of diabetic retinopathy, there are often no symptoms. As the condition gets worse, symptoms may include:  Blurred vision. This is usually caused by swelling due to abnormal blood glucose levels. The blurriness may go away when blood glucose levels return to normal.  Moving specks or dark spots (floaters) in your vision. These can be caused by a small amount of bleeding (hemorrhage) from retinal blood vessels.  Missing parts of your field of vision, such as vision at the sides of the eyes. This can be caused by larger retinal hemorrhages.  Difficulty reading.  Double vision.  Pain in one or both eyes.  Feeling pressure in one or both eyes.  Trouble seeing straight lines. Straight lines may not look straight.  Redness of the eyes that does not go  away. How is this diagnosed? This condition may be diagnosed with an eye exam in which your eye care specialist puts drops in your eyes that enlarge (dilate) your pupils. This lets your health care provider examine your retina and check for changes in your retinal blood vessels. How is this treated? This condition may be treated by:  Keeping your blood glucose and blood pressure within a target range.  Using a type of laser beam to seal your retinal blood vessels. This stops them from bleeding and decreases pressure in your eye.  Getting shots of medicine in the eye to reduce swelling of the center of the retina (macula). You may be given: ? Anti-VEGF medicine. This medicine can help slow vision loss, and may even improve vision. ? Steroid medicine. Follow these instructions at home:   Follow your diabetes management plan as directed by your health care provider. This may include exercising regularly and eating a healthy diet.  Keep your blood glucose level and your blood pressure in your target range, as directed by your health care provider.  Check your blood glucose as often as directed.  Take over the counter and prescription medicines only as told by your health care provider. This includes insulin and oral diabetes medicine.  Get your eyes checked at least once every year. An eye specialist can usually see diabetic retinopathy developing long before it starts to cause problems. In many cases, it can be treated to prevent complications from occurring.  Do not use any products that contain nicotine or tobacco, such as cigarettes and e-cigarettes. If  you need help quitting, ask your health care provider.  Keep all follow-up visits as told by your health care provider. This is important. Contact a health care provider if:  You notice gradual blurring or other changes in your vision over time.  You notice that your glasses or contact lenses do not make things look as sharp as they  once did.  You have trouble reading or seeing details at a distance with either eye.  You notice a change in your vision or notice that parts of your field of vision appear missing or hazy.  You suddenly see moving specks or dark spots in the field of vision of either eye. Get help right away if:  You have sudden pain or pressure in one or both eyes.  You suddenly lose vision or a curtain or veil seems to come across your eyes.  You have a sudden burst of floaters in your vision. Summary  Diabetic retinopathy is a disease of the retina. The retina is a light-sensitive membrane at the back of the eye. Retinopathy is a complication of diabetes.  Get your eyes checked at least once every year. An eye specialist can usually see diabetic retinopathy developing long before it starts to cause problems. In many cases, it can be treated to prevent complications from occurring.  Keep your blood glucose and your blood pressure in target range. Follow your diabetes management plan as directed by your health care provider.  Protect your eyes. Wear sunglasses and eye protection when needed. This information is not intended to replace advice given to you by your health care provider. Make sure you discuss any questions you have with your health care provider. Document Revised: 03/05/2017 Document Reviewed: 02/26/2016 Elsevier Patient Education  2020 [employer].

## 2019-11-01 NOTE — Progress Notes (Signed)
11/01/2019     CHIEF COMPLAINT Patient presents for Retina Follow Up   HISTORY OF PRESENT ILLNESS: Patrick Brown is a 49 y.o. male who presents to the clinic today for:   HPI    Retina Follow Up    Patient presents with  Diabetic Retinopathy.  In both eyes.  This started 1 year ago.  Severity is mild.  Duration of 1 year.  Since onset it is stable.          Comments    1 Year Diabetic F/U OU  Pt denies noticeable changes to [place] OU since last visit. Pt denies ocular pain, flashes of light, or floaters OU.  A1c: 6.9, 08/2019 LBS: 229 this AM       Last edited by Rockie Neighbours, [place] on 11/01/2019  8:14 AM. (History)      Referring physician: Dettinger, Fransisca Kaufmann, MD [place],  [place] [address]  HISTORICAL INFORMATION:   Selected notes from the MEDICAL RECORD NUMBER    Lab Results  Component Value Date   HGBA1C 6.7 10/08/2019     CURRENT MEDICATIONS: No current outpatient medications on file. (Ophthalmic Drugs)   No current facility-administered medications for this visit. (Ophthalmic Drugs)   Current Outpatient Medications (Other)  Medication Sig  . acetaminophen (TYLENOL) 500 MG tablet Take 1,000 mg by mouth 2 (two) times daily.   Marland Kitchen acyclovir (ZOVIRAX) 400 MG tablet TAKE 1 TABLET (400 MG TOTAL) BY MOUTH 2 (TWO) TIMES DAILY.  . AgaMatrix Ultra-Thin Lancets MISC TEST BS 4 TIMES A DAY AND AS NEEDED DX E10.65  . amLODipine (NORVASC) 10 MG tablet Take 1 tablet by mouth daily.  Marland Kitchen amoxicillin-clavulanate (AUGMENTIN) 875-125 MG tablet Take 1 tablet by mouth 2 (two) times daily.  Marland Kitchen aspirin 81 MG chewable tablet Chew 81 mg by mouth every morning.  . bictegravir-emtricitabine-tenofovir AF (BIKTARVY) 50-200-25 MG TABS tablet Take 1 tablet by mouth daily.  . cetirizine (ZYRTEC) 10 MG tablet Take 1 tablet (10 mg total) by mouth daily.  . cyclobenzaprine (FLEXERIL) 10 MG tablet Take 1 tablet (10 mg total) by mouth 3 (three) times daily as needed for muscle spasms.  .  diclofenac sodium (VOLTAREN) 1 % GEL Apply 2 g topically 4 (four) times daily.  . diphenoxylate-atropine (LOMOTIL) 2.5-0.025 MG tablet Take 1 tablet by mouth 4 (four) times daily as needed for diarrhea or loose stools.  . DULoxetine (CYMBALTA) 30 MG capsule Take 1 capsule (30 mg total) by mouth daily. Take with the 99m for total of 90mg   . DULoxetine (CYMBALTA) 60 MG capsule Take 1 capsule (60 mg total) by mouth daily. Take with 30 mg fot a total of 90mg   . esomeprazole (NEXIUM) 40 MG capsule TAKE 1 CAPSULE BY MOUTH EVERY DAY  . febuxostat (ULORIC) 40 MG tablet TAKE 1 TABLET BY MOUTH EVERY DAY  . ferrous sulfate 325 (65 FE) MG tablet Take 650 mg by mouth daily with breakfast.   . fluticasone (FLONASE) 50 MCG/ACT nasal spray SPRAY 2 SPRAYS INTO EACH NOSTRIL EVERY DAY  . furosemide (LASIX) 40 MG tablet Take 80 mg by mouth daily. 80mg  am , 20mg  pm  . Glucosamine-Chondroit-Vit C-Mn (GLUCOSAMINE 1500 COMPLEX PO) Take 1 tablet by mouth 2 (two) times daily.   Marland Kitchen glucosamine-chondroitin 500-400 MG tablet Take by mouth.   Marland Kitchen glucose blood (ONETOUCH VERIO) test strip TEST BLOOD SUGAR 4 TIMES DAILY AND AS NEEDED  . hyoscyamine (LEVSIN) 0.125 MG tablet TAKE 1 TABLET (0.125 MG TOTAL)  BY MOUTH EVERY 4 (FOUR) HOURS AS NEEDED.  Marland Kitchen insulin glargine (LANTUS) 100 UNIT/ML injection In the event of insulin pump failure, inject 8 units twice daily  . insulin lispro (HUMALOG) 100 UNIT/ML injection USE 42 UNITS TO 120 UNITS PER PUMP DAILY AS DIRECTED  . ipratropium (ATROVENT) 0.03 % nasal spray USE 2 SPRAYS IN EACH NOSTRIL 2-3 TIMES DAILY  . levothyroxine (SYNTHROID) 150 MCG tablet Take 1 tablet (150 mcg total) by mouth daily before breakfast.  . lipase/protease/amylase (CREON) 12000-38000 units CPEP capsule Take 2 capsules prior to meals and 1 capsule prior to snacks  . LORazepam (ATIVAN) 0.5 MG tablet Take 1 tablet (0.5 mg total) by mouth daily as needed.  Marland Kitchen losartan (COZAAR) 100 MG tablet Take 100 mg by mouth daily.  .  meclizine (ANTIVERT) 12.5 MG tablet Take 1 tablet (12.5 mg total) by mouth 3 (three) times daily as needed for dizziness.  . metoCLOPramide (REGLAN) 5 MG tablet Take 1 tablet (5 mg total) by mouth 3 (three) times daily before meals.  . niacin (NIASPAN) 1000 MG CR tablet TAKE 1 TABLET (1,000 MG TOTAL) BY MOUTH AT BEDTIME.  Marland Kitchen ondansetron (ZOFRAN) 4 MG tablet Take 1 tablet (4 mg total) by mouth every 8 (eight) hours as needed for nausea.  Marland Kitchen OVER THE COUNTER MEDICATION Take 1 capsule by mouth daily. [name]- probiotic daily  . Probiotic Product (MISC INTESTINAL FLORA REGULAT) CAPS Take 1 capsule by mouth every morning.  . promethazine (PHENERGAN) 25 MG suppository Place 1 suppository (25 mg total) rectally every 6 (six) hours as needed for nausea or vomiting.  . promethazine-dextromethorphan (PROMETHAZINE-DM) 6.25-15 MG/5ML syrup Take 5 mLs by mouth 4 (four) times daily as needed for cough.  . sevelamer carbonate (RENVELA) 800 MG tablet Take 800 mg by mouth 3 (three) times daily.  . simvastatin (ZOCOR) 40 MG tablet Take 1 tablet (40 mg total) by mouth daily at 6 PM.  . tamsulosin (FLOMAX) 0.4 MG CAPS capsule Take 0.4 mg by mouth daily.  . Testosterone 20.25 MG/ACT (1.62%) GEL APPLY 3 PUMPS DAILY AS DIRECTED  . traZODone (DESYREL) 50 MG tablet Take by mouth.  Marland Kitchen VASCEPA 1 g capsule TAKE 2 CAPSULES (2 G TOTAL) BY MOUTH 2 (TWO) TIMES DAILY.   No current facility-administered medications for this visit. (Other)      REVIEW OF SYSTEMS:    ALLERGIES Allergies  Allergen Reactions  . Sulfa Antibiotics Other (See Comments)    High potassium  . Ramipril Cough  . Versed [Midazolam] Other (See Comments)    "I don't wake up very good or clear it out of my system"    PAST MEDICAL HISTORY Past Medical History:  Diagnosis Date  . Anemia, iron deficiency On procrit  . CAP (community acquired pneumonia)   . CKD (chronic kidney disease) stage 3, GFR 30-59 ml/min   . Degenerative arthritis   .  Depression   . Dyslipidemia   . Gastroesophageal reflux disease   . Gastroparesis diabeticorum ([place])   . Hematuria, microscopic 10/09   work up negative (Dr. Amalia Hailey)  . HIV positive ([place])   . 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 Hypothyroidism   . IDDM (insulin dependent diabetes mellitus)    38 years  . Low HDL (under 40)   . Proteinuria   . Retinopathy  x2  . SIRS (systemic inflammatory response syndrome) (HCC)    Past Surgical History:  Procedure Laterality Date  . CATARACT EXTRACTION Right   . COLONOSCOPY    . ESOPHAGOGASTRODUODENOSCOPY    . EYE SURGERY  2006,2001   x2   . HIP ARTHROPLASTY Right 05/10/2016   Procedure: RIGHT HIP HEMIARTHROPLASTY;  Surgeon: Marchia Bond, MD;  Location: [facility];  Service: Orthopedics;  Laterality: Right;  . insulin pump    . LASIK Bilateral   . VIDEO BRONCHOSCOPY Bilateral 12/15/2012   Procedure: VIDEO BRONCHOSCOPY WITH FLUORO;  Surgeon: Kathee Delton, MD;  Location: [facility];  Service: Cardiopulmonary;  Laterality: Bilateral;  . VITRECTOMY  bilateral    FAMILY HISTORY Family History  Problem Relation Age of Onset  . Diabetes type I Brother   . Prostate cancer Other   . Dementia Other   . Dementia Other   . Dementia Other     SOCIAL HISTORY Social History   Tobacco Use  . Smoking status: Never Smoker  . Smokeless tobacco: Never Used  Vaping Use  . Vaping Use: Never used  Substance Use Topics  . Alcohol use: No    Alcohol/week: 0.0 standard drinks    Comment: Infrequent, less than 1 x per month.  . Drug use: No         OPHTHALMIC EXAM:  Base Eye Exam    Visual Acuity (ETDRS)      Right Left   Dist cc 20/40 -1 20/20 -2   Dist ph cc NI    Correction: Glasses       Tonometry (Tonopen,  8:15 AM)      Right Left   Pressure 14 20       Pupils      Pupils Dark Light Shape React APD   Right PERRL 4 3 Round Brisk None   Left PERRL 4 3 Round Brisk None       Visual Fields (Counting fingers)      Left Right    Full Full       Extraocular Movement      Right Left    Full Full       Neuro/Psych    Oriented x3: Yes   Mood/Affect: Normal       Dilation    Both eyes: 1.0% Mydriacyl, 2.5% Phenylephrine @ 8:17 AM        Slit Lamp and Fundus Exam    External Exam      Right Left   External Normal Normal       Slit Lamp Exam      Right Left   Lids/Lashes Normal Normal   Conjunctiva/Sclera White and quiet White and quiet   Cornea Clear Clear   Anterior Chamber Deep and quiet Deep and quiet   Iris Round and reactive Round and reactive   Lens Centered posterior chamber intraocular lens Centered posterior chamber intraocular lens   Anterior Vitreous Normal Normal       Fundus Exam      Right Left   Posterior Vitreous Clear, vitrectomized Clear, vitrectomized   Disc Normal    C/D Ratio 0.3 0.3   Macula no macular thickening, Microaneurysms    Vessels PDR-quiet PDR-quiet   Periphery Good PRP Good PRP          IMAGING AND PROCEDURES  Imaging and Procedures for 11/01/19  Color Fundus Photography Optos - OU - Both Eyes       Right Eye Progression has been  stable. Disc findings include normal observations. Macula : microaneurysms.   Left Eye Progression has been stable. Disc findings include normal observations. Macula : microaneurysms.   Notes Quiescent diabetic retinopathy OU, no active maculopathy, good PRP 360, stable with clear media                ASSESSMENT/PLAN:  No problem-specific Assessment & Plan notes found for this encounter.      ICD-10-CM   1. Stable treated proliferative diabetic retinopathy of left eye with macular edema determined by examination associated with type 1 diabetes mellitus ([place])  F64.3329 Color Fundus  Photography Optos - OU - Both Eyes   E10.3512   2. Stable treated proliferative diabetic retinopathy of right eye with macular edema determined by examination associated with type 1 diabetes mellitus ([place])  E10.3551 Color Fundus Photography Optos - OU - Both Eyes   E10.3511   3. Pseudophakia  Z96.1   4. Stable treated proliferative diabetic retinopathy of left eye without macular edema determined by examination associated with type 1 diabetes mellitus ([place])  J18.8416   5. Stable treated proliferative diabetic retinopathy of right eye determined by examination associated with type 1 diabetes mellitus ([place])  E10.3551     1.  OU with a history of macular edema, now completely resolved and quiescent proliferative diabetic retinopathy.  Stable  2.  No active retinopathy   3.  Ophthalmic Meds Ordered this visit:  No orders of the defined types were placed in this encounter.      Return in about 9 months (around 07/31/2020) for DILATE OU, OCT.  Patient Instructions  And asked to report promptly if new visual acuity declines distortions or haziness Diabetic Retinopathy Diabetic retinopathy is a disease of the retina. The retina is a light-sensitive membrane at the back of the eye. Retinopathy is a complication of diabetes (diabetes mellitus) and a common cause of bad eyesight (visual impairment). It can eventually cause blindness. Early detection and treatment of diabetic retinopathy is important in keeping your eyes healthy and preventing further damage to them. What are the causes? Diabetic retinopathy is caused by blood sugar (glucose) levels that are too high for an extended period of time. High blood glucose over an extended period of time can:  Damage small blood vessels in the retina, allowing blood to leak through the vessel walls.  Cause new, abnormal blood vessels to grow on the retina. This can scar the retina in the advanced stage of diabetic retinopathy. What increases the  risk? You are more likely to develop this condition if:  You have had diabetes for a long time.  You have poorly controlled blood glucose.  You have high blood pressure. What are the signs or symptoms? In the early stages of diabetic retinopathy, there are often no symptoms. As the condition gets worse, symptoms may include:  Blurred vision. This is usually caused by swelling due to abnormal blood glucose levels. The blurriness may go away when blood glucose levels return to normal.  Moving specks or dark spots (floaters) in your vision. These can be caused by a small amount of bleeding (hemorrhage) from retinal blood vessels.  Missing parts of your field of vision, such as vision at the sides of the eyes. This can be caused by larger retinal hemorrhages.  Difficulty reading.  Double vision.  Pain in one or both eyes.  Feeling pressure in one or both eyes.  Trouble seeing straight lines. Straight lines may not look straight.  Redness of  the eyes that does not go away. How is this diagnosed? This condition may be diagnosed with an eye exam in which your eye care specialist puts drops in your eyes that enlarge (dilate) your pupils. This lets your health care provider examine your retina and check for changes in your retinal blood vessels. How is this treated? This condition may be treated by:  Keeping your blood glucose and blood pressure within a target range.  Using a type of laser beam to seal your retinal blood vessels. This stops them from bleeding and decreases pressure in your eye.  Getting shots of medicine in the eye to reduce swelling of the center of the retina (macula). You may be given: ? Anti-VEGF medicine. This medicine can help slow vision loss, and may even improve vision. ? Steroid medicine. Follow these instructions at home:   Follow your diabetes management plan as directed by your health care provider. This may include exercising regularly and eating a  healthy diet.  Keep your blood glucose level and your blood pressure in your target range, as directed by your health care provider.  Check your blood glucose as often as directed.  Take over the counter and prescription medicines only as told by your health care provider. This includes insulin and oral diabetes medicine.  Get your eyes checked at least once every year. An eye specialist can usually see diabetic retinopathy developing long before it starts to cause problems. In many cases, it can be treated to prevent complications from occurring.  Do not use any products that contain nicotine or tobacco, such as cigarettes and e-cigarettes. If you need help quitting, ask your health care provider.  Keep all follow-up visits as told by your health care provider. This is important. Contact a health care provider if:  You notice gradual blurring or other changes in your vision over time.  You notice that your glasses or contact lenses do not make things look as sharp as they once did.  You have trouble reading or seeing details at a distance with either eye.  You notice a change in your vision or notice that parts of your field of vision appear missing or hazy.  You suddenly see moving specks or dark spots in the field of vision of either eye. Get help right away if:  You have sudden pain or pressure in one or both eyes.  You suddenly lose vision or a curtain or veil seems to come across your eyes.  You have a sudden burst of floaters in your vision. Summary  Diabetic retinopathy is a disease of the retina. The retina is a light-sensitive membrane at the back of the eye. Retinopathy is a complication of diabetes.  Get your eyes checked at least once every year. An eye specialist can usually see diabetic retinopathy developing long before it starts to cause problems. In many cases, it can be treated to prevent complications from occurring.  Keep your blood glucose and your blood  pressure in target range. Follow your diabetes management plan as directed by your health care provider.  Protect your eyes. Wear sunglasses and eye protection when needed. This information is not intended to replace advice given to you by your health care provider. Make sure you discuss any questions you have with your health care provider. Document Revised: 03/05/2017 Document Reviewed: 02/26/2016 Elsevier Patient Education  2020 [employer].     Explained the diagnoses, plan, and follow up with the patient and they expressed understanding.  Patient expressed understanding of the importance of proper follow up care.   Clent Demark Kelan Pritt M.D. Diseases & Surgery of the Retina and Vitreous Retina & Diabetic [place] 11/01/19     Abbreviations: M myopia (nearsighted); A astigmatism; H hyperopia (farsighted); P presbyopia; Mrx spectacle prescription;  CTL contact lenses; OD right eye; OS left eye; OU both eyes  XT exotropia; ET esotropia; PEK punctate epithelial keratitis; PEE punctate epithelial erosions; DES dry eye syndrome; MGD meibomian gland dysfunction; ATs artificial tears; PFAT's preservative free artificial tears; [place] nuclear sclerotic cataract; PSC posterior subcapsular cataract; ERM epi-retinal membrane; PVD posterior vitreous detachment; RD retinal detachment; DM diabetes mellitus; DR diabetic retinopathy; NPDR non-proliferative diabetic retinopathy; PDR proliferative diabetic retinopathy; CSME clinically significant macular edema; DME diabetic macular edema; dbh dot blot hemorrhages; CWS cotton wool spot; POAG primary open angle glaucoma; C/D cup-to-disc ratio; HVF humphrey visual field; GVF goldmann visual field; OCT optical coherence tomography; IOP intraocular pressure; BRVO Branch retinal vein occlusion; CRVO central retinal vein occlusion; CRAO central retinal artery occlusion; BRAO branch retinal artery occlusion; RT retinal tear; SB scleral buckle; PPV pars plana vitrectomy; VH  Vitreous hemorrhage; PRP panretinal laser photocoagulation; IVK intravitreal kenalog; VMT vitreomacular traction; MH Macular hole;  NVD neovascularization of the disc; NVE neovascularization elsewhere; AREDS age related eye disease study; ARMD age related macular degeneration; POAG primary open angle glaucoma; EBMD epithelial/anterior basement membrane dystrophy; ACIOL anterior chamber intraocular lens; IOL intraocular lens; PCIOL posterior chamber intraocular lens; Phaco/IOL phacoemulsification with intraocular lens placement; [place] photorefractive keratectomy; LASIK laser assisted in situ keratomileusis; HTN hypertension; DM diabetes mellitus; COPD chronic obstructive pulmonary disease

## 2019-11-02 ENCOUNTER — Encounter (INDEPENDENT_AMBULATORY_CARE_PROVIDER_SITE_OTHER): Payer: Medicare Other | Admitting: Ophthalmology

## 2019-11-02 DIAGNOSIS — Z23 Encounter for immunization: Secondary | ICD-10-CM | POA: Diagnosis not present

## 2019-11-02 DIAGNOSIS — N2581 Secondary hyperparathyroidism of renal origin: Secondary | ICD-10-CM | POA: Diagnosis not present

## 2019-11-02 DIAGNOSIS — D631 Anemia in chronic kidney disease: Secondary | ICD-10-CM | POA: Diagnosis not present

## 2019-11-02 DIAGNOSIS — G4733 Obstructive sleep apnea (adult) (pediatric): Secondary | ICD-10-CM | POA: Diagnosis not present

## 2019-11-02 DIAGNOSIS — N186 End stage renal disease: Secondary | ICD-10-CM | POA: Diagnosis not present

## 2019-11-04 DIAGNOSIS — Z992 Dependence on renal dialysis: Secondary | ICD-10-CM | POA: Diagnosis not present

## 2019-11-04 DIAGNOSIS — N186 End stage renal disease: Secondary | ICD-10-CM | POA: Diagnosis not present

## 2019-11-05 ENCOUNTER — Other Ambulatory Visit: Payer: Self-pay | Admitting: Family Medicine

## 2019-11-05 DIAGNOSIS — E8779 Other fluid overload: Secondary | ICD-10-CM | POA: Diagnosis not present

## 2019-11-05 DIAGNOSIS — N186 End stage renal disease: Secondary | ICD-10-CM | POA: Diagnosis not present

## 2019-11-09 DIAGNOSIS — E8779 Other fluid overload: Secondary | ICD-10-CM | POA: Diagnosis not present

## 2019-11-09 DIAGNOSIS — N186 End stage renal disease: Secondary | ICD-10-CM | POA: Diagnosis not present

## 2019-11-09 DIAGNOSIS — D631 Anemia in chronic kidney disease: Secondary | ICD-10-CM | POA: Diagnosis not present

## 2019-11-10 DIAGNOSIS — E8779 Other fluid overload: Secondary | ICD-10-CM | POA: Diagnosis not present

## 2019-11-10 DIAGNOSIS — D631 Anemia in chronic kidney disease: Secondary | ICD-10-CM | POA: Diagnosis not present

## 2019-11-10 DIAGNOSIS — N186 End stage renal disease: Secondary | ICD-10-CM | POA: Diagnosis not present

## 2019-11-11 ENCOUNTER — Ambulatory Visit: Payer: Medicare Other

## 2019-11-11 DIAGNOSIS — N186 End stage renal disease: Secondary | ICD-10-CM | POA: Diagnosis not present

## 2019-11-11 DIAGNOSIS — E8779 Other fluid overload: Secondary | ICD-10-CM | POA: Diagnosis not present

## 2019-11-11 DIAGNOSIS — D631 Anemia in chronic kidney disease: Secondary | ICD-10-CM | POA: Diagnosis not present

## 2019-11-12 DIAGNOSIS — E8779 Other fluid overload: Secondary | ICD-10-CM | POA: Diagnosis not present

## 2019-11-12 DIAGNOSIS — N186 End stage renal disease: Secondary | ICD-10-CM | POA: Diagnosis not present

## 2019-11-12 DIAGNOSIS — D631 Anemia in chronic kidney disease: Secondary | ICD-10-CM | POA: Diagnosis not present

## 2019-11-13 DIAGNOSIS — D631 Anemia in chronic kidney disease: Secondary | ICD-10-CM | POA: Diagnosis not present

## 2019-11-13 DIAGNOSIS — E8779 Other fluid overload: Secondary | ICD-10-CM | POA: Diagnosis not present

## 2019-11-13 DIAGNOSIS — N186 End stage renal disease: Secondary | ICD-10-CM | POA: Diagnosis not present

## 2019-11-16 DIAGNOSIS — E8779 Other fluid overload: Secondary | ICD-10-CM | POA: Diagnosis not present

## 2019-11-16 DIAGNOSIS — D631 Anemia in chronic kidney disease: Secondary | ICD-10-CM | POA: Diagnosis not present

## 2019-11-16 DIAGNOSIS — N186 End stage renal disease: Secondary | ICD-10-CM | POA: Diagnosis not present

## 2019-11-16 DIAGNOSIS — E119 Type 2 diabetes mellitus without complications: Secondary | ICD-10-CM | POA: Diagnosis not present

## 2019-11-17 DIAGNOSIS — N186 End stage renal disease: Secondary | ICD-10-CM | POA: Diagnosis not present

## 2019-11-17 DIAGNOSIS — D631 Anemia in chronic kidney disease: Secondary | ICD-10-CM | POA: Diagnosis not present

## 2019-11-17 DIAGNOSIS — E8779 Other fluid overload: Secondary | ICD-10-CM | POA: Diagnosis not present

## 2019-11-18 ENCOUNTER — Telehealth: Payer: Self-pay

## 2019-11-18 DIAGNOSIS — Z01818 Encounter for other preprocedural examination: Secondary | ICD-10-CM | POA: Diagnosis not present

## 2019-11-18 DIAGNOSIS — Z23 Encounter for immunization: Secondary | ICD-10-CM | POA: Diagnosis not present

## 2019-11-18 NOTE — Telephone Encounter (Signed)
Received call today from Dr. Saundra Shelling transplant MD with [facility] requesting to speak with Dr. Linus Salmons. Provided MD with pager for Dr. Linus Salmons. Will send message to provider to make aware. [place]

## 2019-11-19 DIAGNOSIS — E8779 Other fluid overload: Secondary | ICD-10-CM | POA: Diagnosis not present

## 2019-11-19 DIAGNOSIS — D631 Anemia in chronic kidney disease: Secondary | ICD-10-CM | POA: Diagnosis not present

## 2019-11-19 DIAGNOSIS — N186 End stage renal disease: Secondary | ICD-10-CM | POA: Diagnosis not present

## 2019-11-20 DIAGNOSIS — D631 Anemia in chronic kidney disease: Secondary | ICD-10-CM | POA: Diagnosis not present

## 2019-11-20 DIAGNOSIS — E8779 Other fluid overload: Secondary | ICD-10-CM | POA: Diagnosis not present

## 2019-11-20 DIAGNOSIS — N186 End stage renal disease: Secondary | ICD-10-CM | POA: Diagnosis not present

## 2019-11-22 DIAGNOSIS — D510 Vitamin B12 deficiency anemia due to intrinsic factor deficiency: Secondary | ICD-10-CM | POA: Diagnosis not present

## 2019-11-22 DIAGNOSIS — N186 End stage renal disease: Secondary | ICD-10-CM | POA: Diagnosis not present

## 2019-11-22 DIAGNOSIS — E8779 Other fluid overload: Secondary | ICD-10-CM | POA: Diagnosis not present

## 2019-11-23 DIAGNOSIS — N186 End stage renal disease: Secondary | ICD-10-CM | POA: Diagnosis not present

## 2019-11-23 DIAGNOSIS — D631 Anemia in chronic kidney disease: Secondary | ICD-10-CM | POA: Diagnosis not present

## 2019-11-23 DIAGNOSIS — E8779 Other fluid overload: Secondary | ICD-10-CM | POA: Diagnosis not present

## 2019-11-24 ENCOUNTER — Ambulatory Visit: Payer: Medicare Other | Admitting: Internal Medicine

## 2019-11-24 DIAGNOSIS — N186 End stage renal disease: Secondary | ICD-10-CM | POA: Diagnosis not present

## 2019-11-24 DIAGNOSIS — E8779 Other fluid overload: Secondary | ICD-10-CM | POA: Diagnosis not present

## 2019-11-25 DIAGNOSIS — E8779 Other fluid overload: Secondary | ICD-10-CM | POA: Diagnosis not present

## 2019-11-25 DIAGNOSIS — N186 End stage renal disease: Secondary | ICD-10-CM | POA: Diagnosis not present

## 2019-11-26 ENCOUNTER — Other Ambulatory Visit: Payer: Self-pay | Admitting: Family Medicine

## 2019-11-26 DIAGNOSIS — N185 Chronic kidney disease, stage 5: Secondary | ICD-10-CM | POA: Diagnosis not present

## 2019-11-26 DIAGNOSIS — I129 Hypertensive chronic kidney disease with stage 1 through stage 4 chronic kidney disease, or unspecified chronic kidney disease: Secondary | ICD-10-CM | POA: Diagnosis not present

## 2019-11-26 DIAGNOSIS — E349 Endocrine disorder, unspecified: Secondary | ICD-10-CM

## 2019-11-26 DIAGNOSIS — Z23 Encounter for immunization: Secondary | ICD-10-CM | POA: Diagnosis not present

## 2019-11-26 DIAGNOSIS — N186 End stage renal disease: Secondary | ICD-10-CM | POA: Diagnosis not present

## 2019-11-26 DIAGNOSIS — E8779 Other fluid overload: Secondary | ICD-10-CM | POA: Diagnosis not present

## 2019-11-26 DIAGNOSIS — E1022 Type 1 diabetes mellitus with diabetic chronic kidney disease: Secondary | ICD-10-CM | POA: Diagnosis not present

## 2019-11-26 DIAGNOSIS — Z794 Long term (current) use of insulin: Secondary | ICD-10-CM | POA: Diagnosis not present

## 2019-11-27 DIAGNOSIS — D631 Anemia in chronic kidney disease: Secondary | ICD-10-CM | POA: Diagnosis not present

## 2019-11-27 DIAGNOSIS — N186 End stage renal disease: Secondary | ICD-10-CM | POA: Diagnosis not present

## 2019-11-27 DIAGNOSIS — E8779 Other fluid overload: Secondary | ICD-10-CM | POA: Diagnosis not present

## 2019-11-30 DIAGNOSIS — D631 Anemia in chronic kidney disease: Secondary | ICD-10-CM | POA: Diagnosis not present

## 2019-11-30 DIAGNOSIS — E8779 Other fluid overload: Secondary | ICD-10-CM | POA: Diagnosis not present

## 2019-11-30 DIAGNOSIS — N186 End stage renal disease: Secondary | ICD-10-CM | POA: Diagnosis not present

## 2019-12-01 DIAGNOSIS — N186 End stage renal disease: Secondary | ICD-10-CM | POA: Diagnosis not present

## 2019-12-01 DIAGNOSIS — D631 Anemia in chronic kidney disease: Secondary | ICD-10-CM | POA: Diagnosis not present

## 2019-12-01 DIAGNOSIS — E8779 Other fluid overload: Secondary | ICD-10-CM | POA: Diagnosis not present

## 2019-12-02 DIAGNOSIS — E109 Type 1 diabetes mellitus without complications: Secondary | ICD-10-CM | POA: Diagnosis not present

## 2019-12-02 DIAGNOSIS — E1022 Type 1 diabetes mellitus with diabetic chronic kidney disease: Secondary | ICD-10-CM | POA: Diagnosis not present

## 2019-12-03 ENCOUNTER — Ambulatory Visit: Payer: Medicare Other | Admitting: Primary Care

## 2019-12-03 DIAGNOSIS — E8779 Other fluid overload: Secondary | ICD-10-CM | POA: Diagnosis not present

## 2019-12-03 DIAGNOSIS — D631 Anemia in chronic kidney disease: Secondary | ICD-10-CM | POA: Diagnosis not present

## 2019-12-03 DIAGNOSIS — N186 End stage renal disease: Secondary | ICD-10-CM | POA: Diagnosis not present

## 2019-12-04 DIAGNOSIS — D631 Anemia in chronic kidney disease: Secondary | ICD-10-CM | POA: Diagnosis not present

## 2019-12-04 DIAGNOSIS — N186 End stage renal disease: Secondary | ICD-10-CM | POA: Diagnosis not present

## 2019-12-04 DIAGNOSIS — E8779 Other fluid overload: Secondary | ICD-10-CM | POA: Diagnosis not present

## 2019-12-05 DIAGNOSIS — N186 End stage renal disease: Secondary | ICD-10-CM | POA: Diagnosis not present

## 2019-12-05 DIAGNOSIS — Z992 Dependence on renal dialysis: Secondary | ICD-10-CM | POA: Diagnosis not present

## 2019-12-07 ENCOUNTER — Ambulatory Visit (INDEPENDENT_AMBULATORY_CARE_PROVIDER_SITE_OTHER): Payer: Medicare Other | Admitting: Primary Care

## 2019-12-07 ENCOUNTER — Encounter: Payer: Self-pay | Admitting: Primary Care

## 2019-12-07 ENCOUNTER — Other Ambulatory Visit: Payer: Self-pay

## 2019-12-07 DIAGNOSIS — Z9989 Dependence on other enabling machines and devices: Secondary | ICD-10-CM | POA: Diagnosis not present

## 2019-12-07 DIAGNOSIS — E8779 Other fluid overload: Secondary | ICD-10-CM | POA: Diagnosis not present

## 2019-12-07 DIAGNOSIS — G4733 Obstructive sleep apnea (adult) (pediatric): Secondary | ICD-10-CM | POA: Diagnosis not present

## 2019-12-07 DIAGNOSIS — N2581 Secondary hyperparathyroidism of renal origin: Secondary | ICD-10-CM | POA: Diagnosis not present

## 2019-12-07 DIAGNOSIS — D631 Anemia in chronic kidney disease: Secondary | ICD-10-CM | POA: Diagnosis not present

## 2019-12-07 DIAGNOSIS — N186 End stage renal disease: Secondary | ICD-10-CM | POA: Diagnosis not present

## 2019-12-07 NOTE — Progress Notes (Addendum)
Virtual Visit via Telephone Note  I connected with Patrick Brown on 12/07/19 at  9:30 AM EDT by telephone and verified that I am speaking with the correct person using two identifiers.  Location: Patient: Home Provider: Office   I discussed the limitations, risks, security and privacy concerns of performing an evaluation and management service by telephone and the availability of in person appointments. I also discussed with the patient that there may be a patient responsible charge related to this service. The patient expressed understanding and agreed to proceed.   History of Present Illness: 49 year old male, never smoked. Past medical history significant for OSA, effusion, nonseasonal allergic rhinitis, hypothyroidism, type 1 diabetes mellitus, end-stage renal disease on hemodialysis, HIV disease, anemia. Patient of Dr. Halford Chessman, last seen on 09/01/2019. PSG 09/26/2014 showed mild obstructive sleep apnea with AHI 11.3, SPO2 low 86%.  Previous [facility] pulmonary encounters 09/01/2019 Patient presents today for follow-up visit for obstructive sleep apnea.  He has not been seen since 2019. Reports developing cough in June. He had a CXR done by PCP which Small right pleural effusion. No acute appearing airspace opacity. Treated with Augmentin for sinusitis. Cough improved after starting dialysis 5 weeks ago. Received HD Tues/Thur/Sat. He will eventually transition to home hemodialysis. He has baseline lower extremity edema. He takes 40 lasix at night. Resumed CPAP use 1 week ago. He was off therapy for several months d/t nasal congestion. He reports sleeping better now. He uses full face face. DME [employer].   [facility] download 08/01/2019-08/30/2019 6/30 days used; 17% greater than 4 hours Average usage days used 6 hours 18 minutes Pressure 5-15 cm H2O (11.5 cm H2O-95%) Minimal air leaks AHI 2.1  12/07/2019-Interim history Today for 47-month telephone visit/CPAP compliance. Patient had stopped wearing CPAP for  several months and recently resumed use in July 2021. He is sleeping extremly well at night with CPAP, maintained on auto titrate 5 to 15 cm H2O. DME  Company is [employer]. No respiratory complaints. Repeat chest x-ray on 09/01/2019 showed clear lungs with no pleural effusion. Patient continues Lasix 40 mg daily and is now on home hemodialysis.  Airview download 11/05/19-12/04/19 30/30 days used; 100% > 4 hours Average usage days used 8 hours 15 mins Pressure 5-15cm h20 (11.8- 95%) Air leaks 15.2L/min (95%) AHI 3.6  Observations/Objective:  -Able to speak in full sentences. No overt shortness of breath, or cough  Assessment and Plan:  Mild obstructive sleep apnea - Well controlled on CPAP. Patient is 100% compliant with use > 4hr - Auto CPAP 5-15cm h20; AHI 3.6  - Advised patient to aim to wear CPAP every night, do not drive if experiencing excessive daytime fatigue - No changes today   Pleural effusion - Resolved on CXR 09/01/19 - Continues lasix 40mg  daily and home dialysis   Follow Up Instructions:  - 6 months follow-up with Dr. Halford Chessman   I discussed the assessment and treatment plan with the patient. The patient was provided an opportunity to ask questions and all were answered. The patient agreed with the plan and demonstrated an understanding of the instructions.   The patient was advised to call back or seek an in-person evaluation if the symptoms worsen or if the condition fails to improve as anticipated.  I provided 15 minutes of non-face-to-face time during this encounter.   Martyn Ehrich, NP

## 2019-12-07 NOTE — Progress Notes (Signed)
Reviewed and agree with assessment/plan.   Chesley Mires, MD [facility] Pulmonary/[facility] 12/07/2019, 10:34 AM Pager:  [phone]

## 2019-12-07 NOTE — Patient Instructions (Addendum)
Continue CPAP every night for 4-6 hours or more each night Do no drive if experiencing excessive daytime fatigue or somnolence   Follow-up: 6 month follow-up with Dr. Halford Chessman   CPAP and BPAP Information CPAP and BPAP are methods of helping a person breathe with the use of air pressure. CPAP stands for "continuous positive airway pressure." BPAP stands for "bi-level positive airway pressure." In both methods, air is blown through your nose or mouth and into your air passages to help you breathe well. CPAP and BPAP use different amounts of pressure to blow air. With CPAP, the amount of pressure stays the same while you breathe in and out. With BPAP, the amount of pressure is increased when you breathe in (inhale) so that you can take larger breaths. Your health care provider will recommend whether CPAP or BPAP would be more helpful for you. Why are CPAP and BPAP treatments used? CPAP or BPAP can be helpful if you have:  Sleep apnea.  Chronic obstructive pulmonary disease (COPD).  Heart failure.  Medical conditions that weaken the muscles of the chest including muscular dystrophy, or neurological diseases such as amyotrophic lateral sclerosis (ALS).  Other problems that cause breathing to be weak, abnormal, or difficult. CPAP is most commonly used for obstructive sleep apnea (OSA) to keep the airways from collapsing when the muscles relax during sleep. How is CPAP or BPAP administered? Both CPAP and BPAP are provided by a small machine with a flexible plastic tube that attaches to a plastic mask. You wear the mask. Air is blown through the mask into your nose or mouth. The amount of pressure that is used to blow the air can be adjusted on the machine. Your health care provider will determine the pressure setting that should be used based on your individual needs. When should CPAP or BPAP be used? In most cases, the mask only needs to be worn during sleep. Generally, the mask needs to be worn  throughout the night and during any daytime naps. People with certain medical conditions may also need to wear the mask at other times when they are awake. Follow instructions from your health care provider about when to use the machine. What are some tips for using the mask?   Because the mask needs to be snug, some people feel trapped or closed-in (claustrophobic) when first using the mask. If you feel this way, you may need to get used to the mask. One way to do this is by holding the mask loosely over your nose or mouth and then gradually applying the mask more snugly. You can also gradually increase the amount of time that you use the mask.  Masks are available in various types and sizes. Some fit over your mouth and nose while others fit over just your nose. If your mask does not fit well, talk with your health care provider about getting a different one.  If you are using a mask that fits over your nose and you tend to breathe through your mouth, a chin strap may be applied to help keep your mouth closed.  The CPAP and BPAP machines have alarms that may sound if the mask comes off or develops a leak.  If you have trouble with the mask, it is very important that you talk with your health care provider about finding a way to make the mask easier to tolerate. Do not stop using the mask. Stopping the use of the mask could have a negative impact on  your health. What are some tips for using the machine?  Place your CPAP or BPAP machine on a secure table or stand near an electrical outlet.  Know where the on/off switch is located on the machine.  Follow instructions from your health care provider about how to set the pressure on your machine and when you should use it.  Do not eat or drink while the CPAP or BPAP machine is on. Food or fluids could get pushed into your lungs by the pressure of the CPAP or BPAP.  Do not smoke. Tobacco smoke residue can damage the machine.  For home use, CPAP  and BPAP machines can be rented or purchased through home health care companies. Many different brands of machines are available. Renting a machine before purchasing may help you find out which particular machine works well for you.  Keep the CPAP or BPAP machine and attachments clean. Ask your health care provider for specific instructions. Get help right away if:  You have redness or open areas around your nose or mouth where the mask fits.  You have trouble using the CPAP or BPAP machine.  You cannot tolerate wearing the CPAP or BPAP mask.  You have pain, discomfort, and bloating in your abdomen. Summary  CPAP and BPAP are methods of helping a person breathe with the use of air pressure.  Both CPAP and BPAP are provided by a small machine with a flexible plastic tube that attaches to a plastic mask.  If you have trouble with the mask, it is very important that you talk with your health care provider about finding a way to make the mask easier to tolerate. This information is not intended to replace advice given to you by your health care provider. Make sure you discuss any questions you have with your health care provider. Document Revised: 05/13/2018 Document Reviewed: 12/11/2015 Elsevier Patient Education  [place].

## 2019-12-08 DIAGNOSIS — D631 Anemia in chronic kidney disease: Secondary | ICD-10-CM | POA: Diagnosis not present

## 2019-12-08 DIAGNOSIS — N2581 Secondary hyperparathyroidism of renal origin: Secondary | ICD-10-CM | POA: Diagnosis not present

## 2019-12-08 DIAGNOSIS — E119 Type 2 diabetes mellitus without complications: Secondary | ICD-10-CM | POA: Diagnosis not present

## 2019-12-08 DIAGNOSIS — E8779 Other fluid overload: Secondary | ICD-10-CM | POA: Diagnosis not present

## 2019-12-08 DIAGNOSIS — Z4932 Encounter for adequacy testing for peritoneal dialysis: Secondary | ICD-10-CM | POA: Diagnosis not present

## 2019-12-08 DIAGNOSIS — N186 End stage renal disease: Secondary | ICD-10-CM | POA: Diagnosis not present

## 2019-12-10 DIAGNOSIS — N186 End stage renal disease: Secondary | ICD-10-CM | POA: Diagnosis not present

## 2019-12-10 DIAGNOSIS — D631 Anemia in chronic kidney disease: Secondary | ICD-10-CM | POA: Diagnosis not present

## 2019-12-10 DIAGNOSIS — N2581 Secondary hyperparathyroidism of renal origin: Secondary | ICD-10-CM | POA: Diagnosis not present

## 2019-12-10 DIAGNOSIS — E8779 Other fluid overload: Secondary | ICD-10-CM | POA: Diagnosis not present

## 2019-12-11 DIAGNOSIS — N186 End stage renal disease: Secondary | ICD-10-CM | POA: Diagnosis not present

## 2019-12-11 DIAGNOSIS — D631 Anemia in chronic kidney disease: Secondary | ICD-10-CM | POA: Diagnosis not present

## 2019-12-11 DIAGNOSIS — N2581 Secondary hyperparathyroidism of renal origin: Secondary | ICD-10-CM | POA: Diagnosis not present

## 2019-12-11 DIAGNOSIS — E8779 Other fluid overload: Secondary | ICD-10-CM | POA: Diagnosis not present

## 2019-12-12 ENCOUNTER — Other Ambulatory Visit: Payer: Self-pay | Admitting: Family Medicine

## 2019-12-14 DIAGNOSIS — N186 End stage renal disease: Secondary | ICD-10-CM | POA: Diagnosis not present

## 2019-12-14 DIAGNOSIS — E8779 Other fluid overload: Secondary | ICD-10-CM | POA: Diagnosis not present

## 2019-12-14 DIAGNOSIS — D631 Anemia in chronic kidney disease: Secondary | ICD-10-CM | POA: Diagnosis not present

## 2019-12-14 DIAGNOSIS — N2581 Secondary hyperparathyroidism of renal origin: Secondary | ICD-10-CM | POA: Diagnosis not present

## 2019-12-15 DIAGNOSIS — D631 Anemia in chronic kidney disease: Secondary | ICD-10-CM | POA: Diagnosis not present

## 2019-12-15 DIAGNOSIS — N186 End stage renal disease: Secondary | ICD-10-CM | POA: Diagnosis not present

## 2019-12-15 DIAGNOSIS — N2581 Secondary hyperparathyroidism of renal origin: Secondary | ICD-10-CM | POA: Diagnosis not present

## 2019-12-15 DIAGNOSIS — E8779 Other fluid overload: Secondary | ICD-10-CM | POA: Diagnosis not present

## 2019-12-17 DIAGNOSIS — E8779 Other fluid overload: Secondary | ICD-10-CM | POA: Diagnosis not present

## 2019-12-17 DIAGNOSIS — N186 End stage renal disease: Secondary | ICD-10-CM | POA: Diagnosis not present

## 2019-12-17 DIAGNOSIS — D631 Anemia in chronic kidney disease: Secondary | ICD-10-CM | POA: Diagnosis not present

## 2019-12-17 DIAGNOSIS — N2581 Secondary hyperparathyroidism of renal origin: Secondary | ICD-10-CM | POA: Diagnosis not present

## 2019-12-18 ENCOUNTER — Other Ambulatory Visit: Payer: Self-pay | Admitting: Family Medicine

## 2019-12-18 DIAGNOSIS — D631 Anemia in chronic kidney disease: Secondary | ICD-10-CM | POA: Diagnosis not present

## 2019-12-18 DIAGNOSIS — N2581 Secondary hyperparathyroidism of renal origin: Secondary | ICD-10-CM | POA: Diagnosis not present

## 2019-12-18 DIAGNOSIS — E8779 Other fluid overload: Secondary | ICD-10-CM | POA: Diagnosis not present

## 2019-12-18 DIAGNOSIS — N186 End stage renal disease: Secondary | ICD-10-CM | POA: Diagnosis not present

## 2019-12-20 DIAGNOSIS — D510 Vitamin B12 deficiency anemia due to intrinsic factor deficiency: Secondary | ICD-10-CM | POA: Diagnosis not present

## 2019-12-21 DIAGNOSIS — N2581 Secondary hyperparathyroidism of renal origin: Secondary | ICD-10-CM | POA: Diagnosis not present

## 2019-12-21 DIAGNOSIS — E8779 Other fluid overload: Secondary | ICD-10-CM | POA: Diagnosis not present

## 2019-12-21 DIAGNOSIS — N186 End stage renal disease: Secondary | ICD-10-CM | POA: Diagnosis not present

## 2019-12-21 DIAGNOSIS — D631 Anemia in chronic kidney disease: Secondary | ICD-10-CM | POA: Diagnosis not present

## 2019-12-22 ENCOUNTER — Encounter: Payer: Self-pay | Admitting: Internal Medicine

## 2019-12-22 ENCOUNTER — Ambulatory Visit: Payer: Medicare Other | Admitting: Internal Medicine

## 2019-12-22 ENCOUNTER — Other Ambulatory Visit: Payer: Self-pay

## 2019-12-22 VITALS — BP 108/68 | HR 93 | Temp 98.0°F | Resp 16 | Ht 70.0 in | Wt 212.8 lb

## 2019-12-22 DIAGNOSIS — Z992 Dependence on renal dialysis: Secondary | ICD-10-CM

## 2019-12-22 DIAGNOSIS — E8779 Other fluid overload: Secondary | ICD-10-CM | POA: Diagnosis not present

## 2019-12-22 DIAGNOSIS — N186 End stage renal disease: Secondary | ICD-10-CM

## 2019-12-22 DIAGNOSIS — Z113 Encounter for screening for infections with a predominantly sexual mode of transmission: Secondary | ICD-10-CM | POA: Diagnosis not present

## 2019-12-22 DIAGNOSIS — B20 Human immunodeficiency virus [HIV] disease: Secondary | ICD-10-CM | POA: Diagnosis not present

## 2019-12-22 DIAGNOSIS — D631 Anemia in chronic kidney disease: Secondary | ICD-10-CM | POA: Diagnosis not present

## 2019-12-22 DIAGNOSIS — N2581 Secondary hyperparathyroidism of renal origin: Secondary | ICD-10-CM | POA: Diagnosis not present

## 2019-12-22 MED ORDER — DOVATO 50-300 MG PO TABS: 1.0000 | ORAL_TABLET | Freq: Every day | ORAL | 11 refills | Status: DC

## 2019-12-22 NOTE — Progress Notes (Signed)
   Subjective:    Patient ID: Patrick Brown, male    DOB: 22-Jan-1971, 49 y.o.   MRN: [id number]  HPI Here for follow up of HIV He continues on Biktarvy and no missed doses.  He is being evaluated for transplant at [facility].  Now on IHD.    Got the COVID vaccine booster in September.  Had all other vaccines with ID transplant team with Dr. Saundra Shelling.     Review of Systems  Constitutional: Negative for fatigue.  Gastrointestinal: Negative for diarrhea and nausea.  Skin: Negative for rash.       Objective:   Physical Exam Eyes:     General: No scleral icterus. Cardiovascular:     Rate and Rhythm: Normal rate and regular rhythm.  Pulmonary:     Effort: Pulmonary effort is normal.  Neurological:     General: No focal deficit present.     Mental Status: He is alert.  Psychiatric:        Mood and Affect: Mood normal.   SH: no tobacco        Assessment & Plan:

## 2019-12-22 NOTE — Assessment & Plan Note (Signed)
He contiunues to do well.  Since he is going to get on the transplant list, will go ahead and change him to Dovato and stop the Biktarvy to have a tenofovir-free regimen. He will complete his current fill of Biktarvy then start the [place].  He will return in about 8 weeks for follow up labs and to reevaluate on the new regimen. This was discussed with Dr. Saundra Shelling.

## 2019-12-22 NOTE — Assessment & Plan Note (Signed)
Doing well on home dialysis.  Changing his regimen for HIV due to the possibility of renal transplant.

## 2019-12-23 ENCOUNTER — Other Ambulatory Visit: Payer: Self-pay | Admitting: Family Medicine

## 2019-12-24 DIAGNOSIS — N2581 Secondary hyperparathyroidism of renal origin: Secondary | ICD-10-CM | POA: Diagnosis not present

## 2019-12-24 DIAGNOSIS — N186 End stage renal disease: Secondary | ICD-10-CM | POA: Diagnosis not present

## 2019-12-24 DIAGNOSIS — E8779 Other fluid overload: Secondary | ICD-10-CM | POA: Diagnosis not present

## 2019-12-24 DIAGNOSIS — D631 Anemia in chronic kidney disease: Secondary | ICD-10-CM | POA: Diagnosis not present

## 2019-12-25 DIAGNOSIS — N2581 Secondary hyperparathyroidism of renal origin: Secondary | ICD-10-CM | POA: Diagnosis not present

## 2019-12-25 DIAGNOSIS — E8779 Other fluid overload: Secondary | ICD-10-CM | POA: Diagnosis not present

## 2019-12-25 DIAGNOSIS — N186 End stage renal disease: Secondary | ICD-10-CM | POA: Diagnosis not present

## 2019-12-25 DIAGNOSIS — D631 Anemia in chronic kidney disease: Secondary | ICD-10-CM | POA: Diagnosis not present

## 2019-12-28 DIAGNOSIS — N2581 Secondary hyperparathyroidism of renal origin: Secondary | ICD-10-CM | POA: Diagnosis not present

## 2019-12-28 DIAGNOSIS — D631 Anemia in chronic kidney disease: Secondary | ICD-10-CM | POA: Diagnosis not present

## 2019-12-28 DIAGNOSIS — E8779 Other fluid overload: Secondary | ICD-10-CM | POA: Diagnosis not present

## 2019-12-28 DIAGNOSIS — N186 End stage renal disease: Secondary | ICD-10-CM | POA: Diagnosis not present

## 2019-12-30 DIAGNOSIS — N2581 Secondary hyperparathyroidism of renal origin: Secondary | ICD-10-CM | POA: Diagnosis not present

## 2019-12-30 DIAGNOSIS — E8779 Other fluid overload: Secondary | ICD-10-CM | POA: Diagnosis not present

## 2019-12-30 DIAGNOSIS — D631 Anemia in chronic kidney disease: Secondary | ICD-10-CM | POA: Diagnosis not present

## 2019-12-30 DIAGNOSIS — N186 End stage renal disease: Secondary | ICD-10-CM | POA: Diagnosis not present

## 2020-01-01 DIAGNOSIS — E8779 Other fluid overload: Secondary | ICD-10-CM | POA: Diagnosis not present

## 2020-01-01 DIAGNOSIS — N2581 Secondary hyperparathyroidism of renal origin: Secondary | ICD-10-CM | POA: Diagnosis not present

## 2020-01-01 DIAGNOSIS — D631 Anemia in chronic kidney disease: Secondary | ICD-10-CM | POA: Diagnosis not present

## 2020-01-01 DIAGNOSIS — N186 End stage renal disease: Secondary | ICD-10-CM | POA: Diagnosis not present

## 2020-01-04 ENCOUNTER — Encounter: Payer: Self-pay | Admitting: Family Medicine

## 2020-01-04 DIAGNOSIS — N186 End stage renal disease: Secondary | ICD-10-CM | POA: Diagnosis not present

## 2020-01-04 DIAGNOSIS — Z992 Dependence on renal dialysis: Secondary | ICD-10-CM | POA: Diagnosis not present

## 2020-01-04 DIAGNOSIS — E8779 Other fluid overload: Secondary | ICD-10-CM | POA: Diagnosis not present

## 2020-01-04 DIAGNOSIS — N2581 Secondary hyperparathyroidism of renal origin: Secondary | ICD-10-CM | POA: Diagnosis not present

## 2020-01-04 DIAGNOSIS — D631 Anemia in chronic kidney disease: Secondary | ICD-10-CM | POA: Diagnosis not present

## 2020-01-05 ENCOUNTER — Ambulatory Visit (INDEPENDENT_AMBULATORY_CARE_PROVIDER_SITE_OTHER): Payer: Medicare Other | Admitting: Family Medicine

## 2020-01-05 ENCOUNTER — Encounter: Payer: Self-pay | Admitting: Family Medicine

## 2020-01-05 DIAGNOSIS — J40 Bronchitis, not specified as acute or chronic: Secondary | ICD-10-CM | POA: Diagnosis not present

## 2020-01-05 DIAGNOSIS — D631 Anemia in chronic kidney disease: Secondary | ICD-10-CM | POA: Diagnosis not present

## 2020-01-05 DIAGNOSIS — N186 End stage renal disease: Secondary | ICD-10-CM | POA: Diagnosis not present

## 2020-01-05 DIAGNOSIS — Z4932 Encounter for adequacy testing for peritoneal dialysis: Secondary | ICD-10-CM | POA: Diagnosis not present

## 2020-01-05 DIAGNOSIS — E8779 Other fluid overload: Secondary | ICD-10-CM | POA: Diagnosis not present

## 2020-01-05 DIAGNOSIS — N2581 Secondary hyperparathyroidism of renal origin: Secondary | ICD-10-CM | POA: Diagnosis not present

## 2020-01-05 DIAGNOSIS — E119 Type 2 diabetes mellitus without complications: Secondary | ICD-10-CM | POA: Diagnosis not present

## 2020-01-05 MED ORDER — AMOXICILLIN-POT CLAVULANATE 875-125 MG PO TABS: 1.0000 | ORAL_TABLET | Freq: Two times a day (BID) | ORAL | 0 refills | Status: DC

## 2020-01-05 MED ORDER — BENZONATATE 100 MG PO CAPS: 100.0000 mg | ORAL_CAPSULE | Freq: Two times a day (BID) | ORAL | 0 refills | Status: DC | PRN

## 2020-01-05 NOTE — Progress Notes (Signed)
   Virtual Visit via telephone Note  I connected with Patrick Brown on 01/05/20 at 1727 by telephone and verified that I am speaking with the correct person using two identifiers. Patrick Brown is currently located at home and patient are currently with her during visit. The provider, Fransisca Kaufmann Iria Jamerson, MD is located in their office at time of visit.  Call ended at 1738  I discussed the limitations, risks, security and privacy concerns of performing an evaluation and management service by telephone and the availability of in person appointments. I also discussed with the patient that there may be a patient responsible charge related to this service. The patient expressed understanding and agreed to proceed.   History and Present Illness: Patient is calling in for cough and congestion and ear pressure that started 3 weeks ago and has worsened over the past week.  He denies fevers or chills or wheezing or trouble breathing. He is staying hydrated as much as he can. He denies sick contacts that he knows of.  No diagnosis found.    Review of Systems  Constitutional: Negative for chills and fever.  HENT: Positive for congestion, postnasal drip, rhinorrhea and sinus pressure. Negative for ear discharge, ear pain, sneezing, sore throat and voice change.   Eyes: Negative for pain, discharge, redness and visual disturbance.  Respiratory: Positive for cough. Negative for shortness of breath and wheezing.   Cardiovascular: Negative for chest pain and leg swelling.  Musculoskeletal: Negative for gait problem.  Skin: Negative for rash.  All other systems reviewed and are negative.   Observations/Objective: Patient sounds comfortable and in no acute distress  Assessment and Plan: Problem List Items Addressed This Visit    None    Visit Diagnoses    Bronchitis    -  Primary   Relevant Medications   amoxicillin-clavulanate (AUGMENTIN) 875-125 MG tablet   benzonatate (TESSALON) 100 MG capsule    Other Relevant Orders   Novel Coronavirus, NAA ([facility])      Patient has history of HIV and is on management for that and is also on dialysis.  High risk we will send antibiotic Follow up plan: Return if symptoms worsen or fail to improve.     I discussed the assessment and treatment plan with the patient. The patient was provided an opportunity to ask questions and all were answered. The patient agreed with the plan and demonstrated an understanding of the instructions.   The patient was advised to call back or seek an in-person evaluation if the symptoms worsen or if the condition fails to improve as anticipated.  The above assessment and management plan was discussed with the patient. The patient verbalized understanding of and has agreed to the management plan. Patient is aware to call the clinic if symptoms persist or worsen. Patient is aware when to return to the clinic for a follow-up visit. Patient educated on when it is appropriate to go to the emergency department.    I provided 11 minutes of non-face-to-face time during this encounter.    Worthy Rancher, MD

## 2020-01-06 DIAGNOSIS — N186 End stage renal disease: Secondary | ICD-10-CM | POA: Diagnosis not present

## 2020-01-06 DIAGNOSIS — D631 Anemia in chronic kidney disease: Secondary | ICD-10-CM | POA: Diagnosis not present

## 2020-01-06 DIAGNOSIS — E8779 Other fluid overload: Secondary | ICD-10-CM | POA: Diagnosis not present

## 2020-01-06 DIAGNOSIS — N2581 Secondary hyperparathyroidism of renal origin: Secondary | ICD-10-CM | POA: Diagnosis not present

## 2020-01-07 ENCOUNTER — Telehealth: Payer: Self-pay

## 2020-01-07 DIAGNOSIS — N186 End stage renal disease: Secondary | ICD-10-CM | POA: Diagnosis not present

## 2020-01-07 DIAGNOSIS — D630 Anemia in neoplastic disease: Secondary | ICD-10-CM | POA: Diagnosis not present

## 2020-01-07 MED ORDER — ALBUTEROL SULFATE HFA 108 (90 BASE) MCG/ACT IN AERS: 2.0000 | INHALATION_SPRAY | Freq: Four times a day (QID) | RESPIRATORY_TRACT | 0 refills | Status: DC | PRN

## 2020-01-07 NOTE — Telephone Encounter (Signed)
Mother aware

## 2020-01-07 NOTE — Telephone Encounter (Signed)
I sent albuterol for the patient

## 2020-01-08 DIAGNOSIS — N186 End stage renal disease: Secondary | ICD-10-CM | POA: Diagnosis not present

## 2020-01-08 DIAGNOSIS — N2581 Secondary hyperparathyroidism of renal origin: Secondary | ICD-10-CM | POA: Diagnosis not present

## 2020-01-08 DIAGNOSIS — D631 Anemia in chronic kidney disease: Secondary | ICD-10-CM | POA: Diagnosis not present

## 2020-01-08 DIAGNOSIS — E8779 Other fluid overload: Secondary | ICD-10-CM | POA: Diagnosis not present

## 2020-01-10 ENCOUNTER — Other Ambulatory Visit: Payer: Self-pay | Admitting: Family Medicine

## 2020-01-10 DIAGNOSIS — J209 Acute bronchitis, unspecified: Secondary | ICD-10-CM | POA: Diagnosis not present

## 2020-01-10 DIAGNOSIS — E108 Type 1 diabetes mellitus with unspecified complications: Secondary | ICD-10-CM | POA: Diagnosis not present

## 2020-01-10 DIAGNOSIS — R059 Cough, unspecified: Secondary | ICD-10-CM | POA: Diagnosis not present

## 2020-01-10 DIAGNOSIS — R0602 Shortness of breath: Secondary | ICD-10-CM | POA: Diagnosis not present

## 2020-01-10 DIAGNOSIS — J029 Acute pharyngitis, unspecified: Secondary | ICD-10-CM | POA: Diagnosis not present

## 2020-01-10 DIAGNOSIS — N189 Chronic kidney disease, unspecified: Secondary | ICD-10-CM | POA: Diagnosis not present

## 2020-01-10 DIAGNOSIS — Z992 Dependence on renal dialysis: Secondary | ICD-10-CM | POA: Diagnosis not present

## 2020-01-11 ENCOUNTER — Ambulatory Visit: Payer: Medicare Other | Admitting: Family Medicine

## 2020-01-11 DIAGNOSIS — N186 End stage renal disease: Secondary | ICD-10-CM | POA: Diagnosis not present

## 2020-01-11 DIAGNOSIS — D631 Anemia in chronic kidney disease: Secondary | ICD-10-CM | POA: Diagnosis not present

## 2020-01-11 DIAGNOSIS — N2581 Secondary hyperparathyroidism of renal origin: Secondary | ICD-10-CM | POA: Diagnosis not present

## 2020-01-11 DIAGNOSIS — E8779 Other fluid overload: Secondary | ICD-10-CM | POA: Diagnosis not present

## 2020-01-12 DIAGNOSIS — N186 End stage renal disease: Secondary | ICD-10-CM | POA: Diagnosis not present

## 2020-01-12 DIAGNOSIS — G9340 Encephalopathy, unspecified: Secondary | ICD-10-CM | POA: Diagnosis not present

## 2020-01-12 DIAGNOSIS — Z7982 Long term (current) use of aspirin: Secondary | ICD-10-CM | POA: Diagnosis not present

## 2020-01-12 DIAGNOSIS — Z888 Allergy status to other drugs, medicaments and biological substances status: Secondary | ICD-10-CM | POA: Diagnosis not present

## 2020-01-12 DIAGNOSIS — I120 Hypertensive chronic kidney disease with stage 5 chronic kidney disease or end stage renal disease: Secondary | ICD-10-CM | POA: Diagnosis not present

## 2020-01-12 DIAGNOSIS — E1110 Type 2 diabetes mellitus with ketoacidosis without coma: Secondary | ICD-10-CM | POA: Diagnosis not present

## 2020-01-12 DIAGNOSIS — R531 Weakness: Secondary | ICD-10-CM | POA: Diagnosis not present

## 2020-01-12 DIAGNOSIS — G9341 Metabolic encephalopathy: Secondary | ICD-10-CM | POA: Diagnosis not present

## 2020-01-12 DIAGNOSIS — Z992 Dependence on renal dialysis: Secondary | ICD-10-CM | POA: Diagnosis not present

## 2020-01-12 DIAGNOSIS — E875 Hyperkalemia: Secondary | ICD-10-CM | POA: Diagnosis not present

## 2020-01-12 DIAGNOSIS — G4733 Obstructive sleep apnea (adult) (pediatric): Secondary | ICD-10-CM | POA: Diagnosis not present

## 2020-01-12 DIAGNOSIS — R748 Abnormal levels of other serum enzymes: Secondary | ICD-10-CM | POA: Insufficient documentation

## 2020-01-12 DIAGNOSIS — E872 Acidosis: Secondary | ICD-10-CM | POA: Diagnosis not present

## 2020-01-12 DIAGNOSIS — E871 Hypo-osmolality and hyponatremia: Secondary | ICD-10-CM | POA: Diagnosis not present

## 2020-01-12 DIAGNOSIS — E785 Hyperlipidemia, unspecified: Secondary | ICD-10-CM | POA: Diagnosis not present

## 2020-01-12 DIAGNOSIS — Z9641 Presence of insulin pump (external) (internal): Secondary | ICD-10-CM | POA: Diagnosis not present

## 2020-01-12 DIAGNOSIS — D631 Anemia in chronic kidney disease: Secondary | ICD-10-CM | POA: Diagnosis not present

## 2020-01-12 DIAGNOSIS — Z794 Long term (current) use of insulin: Secondary | ICD-10-CM | POA: Diagnosis not present

## 2020-01-12 DIAGNOSIS — J209 Acute bronchitis, unspecified: Secondary | ICD-10-CM | POA: Diagnosis not present

## 2020-01-12 DIAGNOSIS — K219 Gastro-esophageal reflux disease without esophagitis: Secondary | ICD-10-CM | POA: Diagnosis not present

## 2020-01-12 DIAGNOSIS — Z96641 Presence of right artificial hip joint: Secondary | ICD-10-CM | POA: Diagnosis not present

## 2020-01-12 DIAGNOSIS — Z87440 Personal history of urinary (tract) infections: Secondary | ICD-10-CM | POA: Diagnosis not present

## 2020-01-12 DIAGNOSIS — E1022 Type 1 diabetes mellitus with diabetic chronic kidney disease: Secondary | ICD-10-CM | POA: Diagnosis not present

## 2020-01-12 DIAGNOSIS — E1010 Type 1 diabetes mellitus with ketoacidosis without coma: Secondary | ICD-10-CM | POA: Diagnosis not present

## 2020-01-12 DIAGNOSIS — F32A Depression, unspecified: Secondary | ICD-10-CM | POA: Diagnosis not present

## 2020-01-12 DIAGNOSIS — E1011 Type 1 diabetes mellitus with ketoacidosis with coma: Secondary | ICD-10-CM | POA: Diagnosis not present

## 2020-01-12 DIAGNOSIS — I517 Cardiomegaly: Secondary | ICD-10-CM | POA: Diagnosis not present

## 2020-01-12 DIAGNOSIS — Z882 Allergy status to sulfonamides status: Secondary | ICD-10-CM | POA: Diagnosis not present

## 2020-01-12 DIAGNOSIS — I10 Essential (primary) hypertension: Secondary | ICD-10-CM | POA: Diagnosis not present

## 2020-01-12 DIAGNOSIS — E039 Hypothyroidism, unspecified: Secondary | ICD-10-CM | POA: Diagnosis not present

## 2020-01-13 DIAGNOSIS — N186 End stage renal disease: Secondary | ICD-10-CM | POA: Diagnosis not present

## 2020-01-16 ENCOUNTER — Other Ambulatory Visit: Payer: Self-pay | Admitting: Family Medicine

## 2020-01-16 DIAGNOSIS — E785 Hyperlipidemia, unspecified: Secondary | ICD-10-CM | POA: Diagnosis not present

## 2020-01-16 DIAGNOSIS — Z794 Long term (current) use of insulin: Secondary | ICD-10-CM | POA: Diagnosis not present

## 2020-01-16 DIAGNOSIS — J40 Bronchitis, not specified as acute or chronic: Secondary | ICD-10-CM

## 2020-01-16 DIAGNOSIS — R0989 Other specified symptoms and signs involving the circulatory and respiratory systems: Secondary | ICD-10-CM | POA: Diagnosis not present

## 2020-01-16 DIAGNOSIS — R059 Cough, unspecified: Secondary | ICD-10-CM | POA: Diagnosis not present

## 2020-01-16 DIAGNOSIS — R531 Weakness: Secondary | ICD-10-CM | POA: Diagnosis not present

## 2020-01-16 DIAGNOSIS — E039 Hypothyroidism, unspecified: Secondary | ICD-10-CM | POA: Diagnosis not present

## 2020-01-16 DIAGNOSIS — N186 End stage renal disease: Secondary | ICD-10-CM | POA: Diagnosis not present

## 2020-01-16 DIAGNOSIS — I120 Hypertensive chronic kidney disease with stage 5 chronic kidney disease or end stage renal disease: Secondary | ICD-10-CM | POA: Diagnosis not present

## 2020-01-16 DIAGNOSIS — D649 Anemia, unspecified: Secondary | ICD-10-CM | POA: Diagnosis not present

## 2020-01-16 DIAGNOSIS — Z992 Dependence on renal dialysis: Secondary | ICD-10-CM | POA: Diagnosis not present

## 2020-01-16 DIAGNOSIS — R0602 Shortness of breath: Secondary | ICD-10-CM | POA: Diagnosis not present

## 2020-01-16 DIAGNOSIS — E1022 Type 1 diabetes mellitus with diabetic chronic kidney disease: Secondary | ICD-10-CM | POA: Diagnosis not present

## 2020-01-17 ENCOUNTER — Telehealth: Payer: Self-pay | Admitting: Family Medicine

## 2020-01-17 NOTE — Telephone Encounter (Signed)
Transition Care Management Unsuccessful Follow-up Telephone Call Date of discharge and from where:  01/15/2020 [facility]  Attempts:  1st Attempt  Reason for unsuccessful TCM follow-up call:  Left voice message

## 2020-01-18 DIAGNOSIS — N186 End stage renal disease: Secondary | ICD-10-CM | POA: Diagnosis not present

## 2020-01-18 DIAGNOSIS — D631 Anemia in chronic kidney disease: Secondary | ICD-10-CM | POA: Diagnosis not present

## 2020-01-18 DIAGNOSIS — Z23 Encounter for immunization: Secondary | ICD-10-CM | POA: Diagnosis not present

## 2020-01-18 DIAGNOSIS — E8779 Other fluid overload: Secondary | ICD-10-CM | POA: Diagnosis not present

## 2020-01-18 NOTE — Telephone Encounter (Signed)
Transition Care Management Unsuccessful Follow-up Telephone Call  Date of discharge and from where:  01/15/2020  Attempts:  2nd Attempt  Reason for unsuccessful TCM follow-up call:  Left voice message

## 2020-01-19 NOTE — Telephone Encounter (Signed)
Two attempts have been made with no call back This encounter will be closed

## 2020-01-20 DIAGNOSIS — D631 Anemia in chronic kidney disease: Secondary | ICD-10-CM | POA: Diagnosis not present

## 2020-01-20 DIAGNOSIS — E8779 Other fluid overload: Secondary | ICD-10-CM | POA: Diagnosis not present

## 2020-01-20 DIAGNOSIS — N186 End stage renal disease: Secondary | ICD-10-CM | POA: Diagnosis not present

## 2020-01-20 DIAGNOSIS — Z23 Encounter for immunization: Secondary | ICD-10-CM | POA: Diagnosis not present

## 2020-01-22 DIAGNOSIS — Z23 Encounter for immunization: Secondary | ICD-10-CM | POA: Diagnosis not present

## 2020-01-22 DIAGNOSIS — D631 Anemia in chronic kidney disease: Secondary | ICD-10-CM | POA: Diagnosis not present

## 2020-01-22 DIAGNOSIS — E8779 Other fluid overload: Secondary | ICD-10-CM | POA: Diagnosis not present

## 2020-01-22 DIAGNOSIS — N186 End stage renal disease: Secondary | ICD-10-CM | POA: Diagnosis not present

## 2020-01-26 DIAGNOSIS — Z23 Encounter for immunization: Secondary | ICD-10-CM | POA: Diagnosis not present

## 2020-01-26 DIAGNOSIS — E8779 Other fluid overload: Secondary | ICD-10-CM | POA: Diagnosis not present

## 2020-01-26 DIAGNOSIS — N186 End stage renal disease: Secondary | ICD-10-CM | POA: Diagnosis not present

## 2020-01-26 DIAGNOSIS — D631 Anemia in chronic kidney disease: Secondary | ICD-10-CM | POA: Diagnosis not present

## 2020-01-27 DIAGNOSIS — D631 Anemia in chronic kidney disease: Secondary | ICD-10-CM | POA: Diagnosis not present

## 2020-01-27 DIAGNOSIS — E8779 Other fluid overload: Secondary | ICD-10-CM | POA: Diagnosis not present

## 2020-01-27 DIAGNOSIS — N186 End stage renal disease: Secondary | ICD-10-CM | POA: Diagnosis not present

## 2020-01-27 DIAGNOSIS — Z23 Encounter for immunization: Secondary | ICD-10-CM | POA: Diagnosis not present

## 2020-01-29 ENCOUNTER — Other Ambulatory Visit: Payer: Self-pay | Admitting: Family Medicine

## 2020-01-29 DIAGNOSIS — F419 Anxiety disorder, unspecified: Secondary | ICD-10-CM

## 2020-01-30 DIAGNOSIS — E8779 Other fluid overload: Secondary | ICD-10-CM | POA: Diagnosis not present

## 2020-01-30 DIAGNOSIS — N186 End stage renal disease: Secondary | ICD-10-CM | POA: Diagnosis not present

## 2020-01-30 DIAGNOSIS — D631 Anemia in chronic kidney disease: Secondary | ICD-10-CM | POA: Diagnosis not present

## 2020-01-30 DIAGNOSIS — Z23 Encounter for immunization: Secondary | ICD-10-CM | POA: Diagnosis not present

## 2020-02-01 ENCOUNTER — Telehealth: Payer: Self-pay | Admitting: Family Medicine

## 2020-02-01 DIAGNOSIS — N186 End stage renal disease: Secondary | ICD-10-CM | POA: Diagnosis not present

## 2020-02-01 DIAGNOSIS — E8779 Other fluid overload: Secondary | ICD-10-CM | POA: Diagnosis not present

## 2020-02-01 DIAGNOSIS — Z23 Encounter for immunization: Secondary | ICD-10-CM | POA: Diagnosis not present

## 2020-02-01 DIAGNOSIS — D631 Anemia in chronic kidney disease: Secondary | ICD-10-CM | POA: Diagnosis not present

## 2020-02-01 NOTE — Telephone Encounter (Signed)
°  Prescription Request  02/01/2020  What is the name of the medication or equipment? Lorazepam  Have you contacted your pharmacy to request a refill? (if applicable) yes  Which pharmacy would you like this sent to? [facility]   Patient notified that their request is being sent to the clinical staff for review and that they should receive a response within 2 business days.

## 2020-02-01 NOTE — Telephone Encounter (Signed)
Aware must be filled in OV

## 2020-02-02 ENCOUNTER — Telehealth: Payer: Self-pay

## 2020-02-02 ENCOUNTER — Encounter: Payer: Self-pay | Admitting: Family Medicine

## 2020-02-02 DIAGNOSIS — G4733 Obstructive sleep apnea (adult) (pediatric): Secondary | ICD-10-CM | POA: Diagnosis not present

## 2020-02-03 DIAGNOSIS — D631 Anemia in chronic kidney disease: Secondary | ICD-10-CM | POA: Diagnosis not present

## 2020-02-03 DIAGNOSIS — Z23 Encounter for immunization: Secondary | ICD-10-CM | POA: Diagnosis not present

## 2020-02-03 DIAGNOSIS — E8779 Other fluid overload: Secondary | ICD-10-CM | POA: Diagnosis not present

## 2020-02-03 DIAGNOSIS — N186 End stage renal disease: Secondary | ICD-10-CM | POA: Diagnosis not present

## 2020-02-04 DIAGNOSIS — N186 End stage renal disease: Secondary | ICD-10-CM | POA: Diagnosis not present

## 2020-02-04 DIAGNOSIS — Z992 Dependence on renal dialysis: Secondary | ICD-10-CM | POA: Diagnosis not present

## 2020-02-06 DIAGNOSIS — E8779 Other fluid overload: Secondary | ICD-10-CM | POA: Diagnosis not present

## 2020-02-06 DIAGNOSIS — D631 Anemia in chronic kidney disease: Secondary | ICD-10-CM | POA: Diagnosis not present

## 2020-02-06 DIAGNOSIS — N2581 Secondary hyperparathyroidism of renal origin: Secondary | ICD-10-CM | POA: Diagnosis not present

## 2020-02-06 DIAGNOSIS — N186 End stage renal disease: Secondary | ICD-10-CM | POA: Diagnosis not present

## 2020-02-08 DIAGNOSIS — N186 End stage renal disease: Secondary | ICD-10-CM | POA: Diagnosis not present

## 2020-02-08 DIAGNOSIS — E8779 Other fluid overload: Secondary | ICD-10-CM | POA: Diagnosis not present

## 2020-02-08 DIAGNOSIS — D631 Anemia in chronic kidney disease: Secondary | ICD-10-CM | POA: Diagnosis not present

## 2020-02-08 DIAGNOSIS — N2581 Secondary hyperparathyroidism of renal origin: Secondary | ICD-10-CM | POA: Diagnosis not present

## 2020-02-09 ENCOUNTER — Encounter: Payer: Self-pay | Admitting: Family Medicine

## 2020-02-09 ENCOUNTER — Other Ambulatory Visit: Payer: Self-pay

## 2020-02-09 ENCOUNTER — Ambulatory Visit (INDEPENDENT_AMBULATORY_CARE_PROVIDER_SITE_OTHER): Payer: Medicare Other | Admitting: Family Medicine

## 2020-02-09 VITALS — BP 159/78 | HR 103 | Ht 70.0 in | Wt 203.0 lb

## 2020-02-09 DIAGNOSIS — E039 Hypothyroidism, unspecified: Secondary | ICD-10-CM | POA: Diagnosis not present

## 2020-02-09 DIAGNOSIS — E1010 Type 1 diabetes mellitus with ketoacidosis without coma: Secondary | ICD-10-CM

## 2020-02-09 DIAGNOSIS — Z713 Dietary counseling and surveillance: Secondary | ICD-10-CM | POA: Diagnosis not present

## 2020-02-09 DIAGNOSIS — Z23 Encounter for immunization: Secondary | ICD-10-CM | POA: Diagnosis not present

## 2020-02-09 DIAGNOSIS — Z992 Dependence on renal dialysis: Secondary | ICD-10-CM

## 2020-02-09 DIAGNOSIS — N1832 Chronic kidney disease, stage 3b: Secondary | ICD-10-CM | POA: Diagnosis not present

## 2020-02-09 DIAGNOSIS — Z01818 Encounter for other preprocedural examination: Secondary | ICD-10-CM | POA: Diagnosis not present

## 2020-02-09 DIAGNOSIS — N186 End stage renal disease: Secondary | ICD-10-CM | POA: Diagnosis not present

## 2020-02-09 DIAGNOSIS — E1021 Type 1 diabetes mellitus with diabetic nephropathy: Secondary | ICD-10-CM | POA: Diagnosis not present

## 2020-02-09 MED ORDER — HYDROXYZINE HCL 25 MG PO TABS: 25.0000 mg | ORAL_TABLET | Freq: Three times a day (TID) | ORAL | 1 refills | Status: DC | PRN

## 2020-02-09 NOTE — Progress Notes (Signed)
BP (!) 159/78   Pulse (!) 103   Ht 5' 10" (1.778 m)   Wt 203 lb (92.1 kg)   SpO2 100%   BMI 29.13 kg/m    Subjective:   Patient ID: Patrick Brown, male    DOB: 08/14/1970, 50 y.o.   MRN: [id number]  HPI: Patrick Brown is a 50 y.o. male presenting on 02/09/2020 for Hospitalization Follow-up (Pneumonia)   HPI Patient is coming in for hospital follow-up.  He was in the hospital for diabetic ketoacidosis.  He does not know the reason as to why, he says he has an insulin pump and has been using it and does not know what brought it upon him.  Pump failure but he is pump is working now and he is double check the sites and he has been doing well. He denies any chest pain or shortness of breath or breathing.  Patient was admitted to [facility] on 01/12/2020 and discharged on 01/15/2020 for diabetic ketoacidosis with end-stage renal disease.  He is already been on dialysis before this.  He continues to be on dialysis.  He also has an endocrinologist that manages his insulin pump.  Patient had a breech of his controlled substance contract for the lorazepam by the treating staff poorly and also calling an outside of visits, he should have been here early December for a visit he did not come.  Relevant past medical, surgical, family and social history reviewed and updated as indicated. Interim medical history since our last visit reviewed. Allergies and medications reviewed and updated.  Review of Systems  Constitutional: Negative for chills and fever.  Eyes: Negative for visual disturbance.  Respiratory: Negative for shortness of breath and wheezing.   Cardiovascular: Negative for chest pain and leg swelling.  Musculoskeletal: Negative for back pain and gait problem.  Skin: Negative for rash.  Neurological: Negative for dizziness, weakness and light-headedness.  All other systems reviewed and are negative.   Per HPI unless specifically indicated above   Allergies as of  02/09/2020      Reactions   Sulfa Antibiotics Other (See Comments)   High potassium   Ramipril Cough   Versed [midazolam] Other (See Comments)   "I don't wake up very good or clear it out of my system"      Medication List       Accurate as of February 09, 2020 10:47 AM. If you have any questions, ask your nurse or doctor.        STOP taking these medications   amoxicillin-clavulanate 875-125 MG tablet Commonly known as: AUGMENTIN Stopped by: Fransisca Kaufmann Dettinger, MD   calcitRIOL 0.25 MCG capsule Commonly known as: ROCALTROL Stopped by: Fransisca Kaufmann Dettinger, MD   LORazepam 0.5 MG tablet Commonly known as: ATIVAN Stopped by: Worthy Rancher, MD   simvastatin 40 MG tablet Commonly known as: ZOCOR Stopped by: Worthy Rancher, MD     TAKE these medications   acetaminophen 500 MG tablet Commonly known as: TYLENOL Take 1,000 mg by mouth 2 (two) times daily.   acyclovir 400 MG tablet Commonly known as: ZOVIRAX TAKE 1 TABLET (400 MG TOTAL) BY MOUTH 2 (TWO) TIMES DAILY.   AgaMatrix Ultra-Thin Lancets Misc TEST BS 4 TIMES A DAY AND AS NEEDED DX E10.65   albuterol 108 (90 Base) MCG/ACT inhaler Commonly known as: VENTOLIN HFA Inhale 2 puffs into the lungs every 6 (six) hours as needed for wheezing or shortness of breath.   aspirin 81  MG chewable tablet Chew 81 mg by mouth every morning.   benzonatate 100 MG capsule Commonly known as: TESSALON Take 1 capsule (100 mg total) by mouth 2 (two) times daily as needed for cough.   cetirizine 10 MG tablet Commonly known as: ZYRTEC Take 1 tablet (10 mg total) by mouth daily.   cyclobenzaprine 10 MG tablet Commonly known as: FLEXERIL Take 1 tablet (10 mg total) by mouth 3 (three) times daily as needed for muscle spasms.   diclofenac sodium 1 % Gel Commonly known as: VOLTAREN Apply 2 g topically 4 (four) times daily.   diphenoxylate-atropine 2.5-0.025 MG tablet Commonly known as: Lomotil Take 1 tablet by mouth 4 (four)  times daily as needed for diarrhea or loose stools.   Dovato 50-300 MG Tabs Generic drug: Dolutegravir-lamiVUDine Take 1 tablet by mouth daily.   DULoxetine 30 MG capsule Commonly known as: Cymbalta Take 1 capsule (30 mg total) by mouth daily. Take with the 9mfor total of 974m  DULoxetine 60 MG capsule Commonly known as: CYMBALTA Take 1 capsule (60 mg total) by mouth daily. Take with 30 mg fot a total of 9064m esomeprazole 40 MG capsule Commonly known as: NEXIUM TAKE 1 CAPSULE BY MOUTH EVERY DAY   febuxostat 40 MG tablet Commonly known as: ULORIC TAKE 1 TABLET BY MOUTH EVERY DAY   ferrous sulfate 325 (65 FE) MG tablet Take 650 mg by mouth daily with breakfast.   fluticasone 50 MCG/ACT nasal spray Commonly known as: FLONASE SPRAY 2 SPRAYS INTO EACH NOSTRIL EVERY DAY   furosemide 40 MG tablet Commonly known as: LASIX Take 40 mg by mouth daily. 40 mg nightly   GLUCOSAMINE 1500 COMPLEX PO Take 1 tablet by mouth 2 (two) times daily.   glucosamine-chondroitin 500-400 MG tablet Take by mouth.   glucose blood test strip Commonly known as: OneTouch Verio TEST BLOOD SUGAR 4 TIMES DAILY AND AS NEEDED   hydrOXYzine 25 MG tablet Commonly known as: ATARAX/VISTARIL Take 1 tablet (25 mg total) by mouth every 8 (eight) hours as needed. Started by: JosFransisca Kaufmannttinger, MD   hyoscyamine 0.125 MG tablet Commonly known as: LEVSIN TAKE 1 TABLET (0.125 MG TOTAL) BY MOUTH EVERY 4 (FOUR) HOURS AS NEEDED.   insulin glargine 100 UNIT/ML injection Commonly known as: LANTUS In the event of insulin pump failure, inject 8 units twice daily   insulin lispro 100 UNIT/ML injection Commonly known as: HumaLOG USE 42 UNITS TO 120 UNITS PER PUMP DAILY AS DIRECTED   ipratropium 0.03 % nasal spray Commonly known as: ATROVENT USE 2 SPRAYS IN EACH NOSTRIL 2-3 TIMES DAILY   levothyroxine 150 MCG tablet Commonly known as: SYNTHROID Take 1 tablet (150 mcg total) by mouth daily before  breakfast.   lipase/protease/amylase 12000-38000 units Cpep capsule Commonly known as: CREON Take 2 capsules prior to meals and 1 capsule prior to snacks   losartan 50 MG tablet Commonly known as: COZAAR Take 50 mg by mouth daily. Takes when not on dialysis.   meclizine 12.5 MG tablet Commonly known as: ANTIVERT Take 1 tablet (12.5 mg total) by mouth 3 (three) times daily as needed for dizziness.   metoCLOPramide 5 MG tablet Commonly known as: REGLAN Take 1 tablet (5 mg total) by mouth 3 (three) times daily before meals.   Misc Intestinal Flora Regulat Caps Take 1 capsule by mouth every morning.   niacin 1000 MG CR tablet Commonly known as: NIASPAN TAKE 1 TABLET (1,000 MG TOTAL) BY MOUTH AT  BEDTIME.   ondansetron 4 MG tablet Commonly known as: ZOFRAN Take 1 tablet (4 mg total) by mouth every 8 (eight) hours as needed for nausea.   promethazine 25 MG suppository Commonly known as: Phenergan Place 1 suppository (25 mg total) rectally every 6 (six) hours as needed for nausea or vomiting.   promethazine-dextromethorphan 6.25-15 MG/5ML syrup Commonly known as: PROMETHAZINE-DM Take 5 mLs by mouth 4 (four) times daily as needed for cough.   rosuvastatin 20 MG tablet Commonly known as: CRESTOR Take 20 mg by mouth at bedtime.   sevelamer carbonate 800 MG tablet Commonly known as: RENVELA Take 800 mg by mouth 3 (three) times daily. 2 tabs before each meal   tamsulosin 0.4 MG Caps capsule Commonly known as: FLOMAX Take 0.4 mg by mouth daily.   Testosterone 20.25 MG/ACT (1.62%) Gel APPLY 3 PUMPS DAILY AS DIRECTED   traZODone 50 MG tablet Commonly known as: DESYREL Take by mouth.   Vascepa 1 g capsule Generic drug: icosapent Ethyl TAKE 2 CAPSULES (2 G TOTAL) BY MOUTH 2 (TWO) TIMES DAILY.        Objective:   BP (!) 159/78   Pulse (!) 103   Ht 5' 10" (1.778 m)   Wt 203 lb (92.1 kg)   SpO2 100%   BMI 29.13 kg/m   Wt Readings from Last 3 Encounters:  02/09/20  203 lb (92.1 kg)  12/22/19 212 lb 12.8 oz (96.5 kg)  10/08/19 197 lb (89.4 kg)    Physical Exam Vitals and nursing note reviewed.  Constitutional:      General: He is not in acute distress.    Appearance: He is well-developed and well-nourished. He is not diaphoretic.  Eyes:     General: No scleral icterus.    Extraocular Movements: EOM normal.     Conjunctiva/sclera: Conjunctivae normal.  Neck:     Thyroid: No thyromegaly.  Cardiovascular:     Rate and Rhythm: Normal rate and regular rhythm.     Pulses: Intact distal pulses.     Heart sounds: Normal heart sounds. No murmur heard.   Pulmonary:     Effort: Pulmonary effort is normal. No respiratory distress.     Breath sounds: Normal breath sounds. No wheezing.  Abdominal:     General: Abdomen is flat. Bowel sounds are normal. There is no distension.     Tenderness: There is no abdominal tenderness. There is no right CVA tenderness, left CVA tenderness, guarding or rebound.  Musculoskeletal:        General: No edema. Normal range of motion.     Cervical back: Neck supple.  Lymphadenopathy:     Cervical: No cervical adenopathy.  Skin:    General: Skin is warm and dry.     Findings: No rash.  Neurological:     Mental Status: He is alert and oriented to person, place, and time.     Coordination: Coordination normal.  Psychiatric:        Mood and Affect: Mood and affect normal.        Behavior: Behavior normal.     Results for orders placed or performed in visit on 10/08/19  Bayer DCA Hb A1c Waived  Result Value Ref Range   HB A1C (BAYER DCA - WAIVED) 6.7 <7.0 %  TSH  Result Value Ref Range   TSH 9.250 (H) 0.450 - 4.500 uIU/mL    Assessment & Plan:   Problem List Items Addressed This Visit      Endocrine  Type 1 diabetes mellitus (HCC)   Relevant Medications   rosuvastatin (CRESTOR) 20 MG tablet   Other Relevant Orders   CMP14+EGFR   Acquired hypothyroidism   Relevant Orders   TSH     Genitourinary    ESRD on hemodialysis ([place])   Relevant Orders   CBC with Differential/Platelet    Other Visit Diagnoses    Diabetic ketoacidosis without coma associated with type 1 diabetes mellitus ([place])    -  Primary   Relevant Medications   rosuvastatin (CRESTOR) 20 MG tablet    Continue current medication, will recheck thyroid levels because they have been off the last couple times we checked them.  He continues to follow with endocrinology for his diabetes.  Follow up plan: Return in about 3 months (around 05/09/2020), or if symptoms worsen or fail to improve, for Diabetes and thyroid and hypertension cholesterol recheck .  Counseling provided for all of the vaccine components Orders Placed This Encounter  Procedures  . CBC with Differential/Platelet  . CMP14+EGFR  . TSH    Caryl Pina, MD [facility] 02/09/2020, 10:47 AM

## 2020-02-09 NOTE — Addendum Note (Signed)
Addended by: Alphonzo Dublin on: 02/09/2020 10:53 AM   Modules accepted: Orders

## 2020-02-10 DIAGNOSIS — N2581 Secondary hyperparathyroidism of renal origin: Secondary | ICD-10-CM | POA: Diagnosis not present

## 2020-02-10 DIAGNOSIS — E119 Type 2 diabetes mellitus without complications: Secondary | ICD-10-CM | POA: Diagnosis not present

## 2020-02-10 DIAGNOSIS — D631 Anemia in chronic kidney disease: Secondary | ICD-10-CM | POA: Diagnosis not present

## 2020-02-10 DIAGNOSIS — E8779 Other fluid overload: Secondary | ICD-10-CM | POA: Diagnosis not present

## 2020-02-10 DIAGNOSIS — N186 End stage renal disease: Secondary | ICD-10-CM | POA: Diagnosis not present

## 2020-02-10 LAB — CMP14+EGFR
ALT: 15 IU/L (ref 0–44)
AST: 26 IU/L (ref 0–40)
Albumin/Globulin Ratio: 1.7 (ref 1.2–2.2)
Albumin: 4 g/dL (ref 3.8–4.9)
Alkaline Phosphatase: 199 IU/L — ABNORMAL HIGH (ref 44–121)
BUN/Creatinine Ratio: 6 — ABNORMAL LOW (ref 9–20)
BUN: 27 mg/dL — ABNORMAL HIGH (ref 6–24)
Bilirubin Total: 0.2 mg/dL (ref 0.0–1.2)
CO2: 22 mmol/L (ref 20–29)
Calcium: 9.1 mg/dL (ref 8.7–10.2)
Chloride: 98 mmol/L (ref 96–106)
Creatinine, Ser: 4.34 mg/dL (ref 0.76–1.27)
GFR calc Af Amer: 17 mL/min/{1.73_m2} — ABNORMAL LOW (ref >59)
GFR calc non Af Amer: 15 mL/min/{1.73_m2} — ABNORMAL LOW (ref >59)
Globulin, Total: 2.3 g/dL (ref 1.5–4.5)
Glucose: 227 mg/dL — ABNORMAL HIGH (ref 65–99)
Potassium: 3.9 mmol/L (ref 3.5–5.2)
Sodium: 139 mmol/L (ref 134–144)
Total Protein: 6.3 g/dL (ref 6.0–8.5)

## 2020-02-10 LAB — CBC WITH DIFFERENTIAL/PLATELET
Basophils Absolute: 0.1 10*3/uL (ref 0.0–0.2)
Basos: 1 %
EOS (ABSOLUTE): 0.4 10*3/uL (ref 0.0–0.4)
Eos: 7 %
Hematocrit: 24.6 % — ABNORMAL LOW (ref 37.5–51.0)
Hemoglobin: 8.2 g/dL — CL (ref 13.0–17.7)
Immature Grans (Abs): 0.1 10*3/uL (ref 0.0–0.1)
Immature Granulocytes: 1 %
Lymphocytes Absolute: 1.9 10*3/uL (ref 0.7–3.1)
Lymphs: 30 %
MCH: 33.9 pg — ABNORMAL HIGH (ref 26.6–33.0)
MCHC: 33.3 g/dL (ref 31.5–35.7)
MCV: 102 fL — ABNORMAL HIGH (ref 79–97)
Monocytes Absolute: 0.7 10*3/uL (ref 0.1–0.9)
Monocytes: 10 %
Neutrophils Absolute: 3.3 10*3/uL (ref 1.4–7.0)
Neutrophils: 51 %
Platelets: 193 10*3/uL (ref 150–450)
RBC: 2.42 x10E6/uL — CL (ref 4.14–5.80)
RDW: 15.6 % — ABNORMAL HIGH (ref 11.6–15.4)
WBC: 6.5 10*3/uL (ref 3.4–10.8)

## 2020-02-10 LAB — TSH: TSH: 7.97 u[IU]/mL — ABNORMAL HIGH (ref 0.450–4.500)

## 2020-02-12 DIAGNOSIS — D631 Anemia in chronic kidney disease: Secondary | ICD-10-CM | POA: Diagnosis not present

## 2020-02-12 DIAGNOSIS — N186 End stage renal disease: Secondary | ICD-10-CM | POA: Diagnosis not present

## 2020-02-12 DIAGNOSIS — N2581 Secondary hyperparathyroidism of renal origin: Secondary | ICD-10-CM | POA: Diagnosis not present

## 2020-02-12 DIAGNOSIS — E8779 Other fluid overload: Secondary | ICD-10-CM | POA: Diagnosis not present

## 2020-02-14 ENCOUNTER — Telehealth: Payer: Self-pay

## 2020-02-14 MED ORDER — LEVOTHYROXINE SODIUM 175 MCG PO TABS: 175.0000 ug | ORAL_TABLET | Freq: Every day | ORAL | 1 refills | Status: DC

## 2020-02-14 NOTE — Telephone Encounter (Signed)
Sent - pt aware  

## 2020-02-15 DIAGNOSIS — E8779 Other fluid overload: Secondary | ICD-10-CM | POA: Diagnosis not present

## 2020-02-15 DIAGNOSIS — D631 Anemia in chronic kidney disease: Secondary | ICD-10-CM | POA: Diagnosis not present

## 2020-02-15 DIAGNOSIS — N186 End stage renal disease: Secondary | ICD-10-CM | POA: Diagnosis not present

## 2020-02-15 DIAGNOSIS — N2581 Secondary hyperparathyroidism of renal origin: Secondary | ICD-10-CM | POA: Diagnosis not present

## 2020-02-16 DIAGNOSIS — D510 Vitamin B12 deficiency anemia due to intrinsic factor deficiency: Secondary | ICD-10-CM | POA: Diagnosis not present

## 2020-02-17 DIAGNOSIS — N186 End stage renal disease: Secondary | ICD-10-CM | POA: Diagnosis not present

## 2020-02-17 DIAGNOSIS — D631 Anemia in chronic kidney disease: Secondary | ICD-10-CM | POA: Diagnosis not present

## 2020-02-17 DIAGNOSIS — E8779 Other fluid overload: Secondary | ICD-10-CM | POA: Diagnosis not present

## 2020-02-17 DIAGNOSIS — N2581 Secondary hyperparathyroidism of renal origin: Secondary | ICD-10-CM | POA: Diagnosis not present

## 2020-02-19 DIAGNOSIS — N186 End stage renal disease: Secondary | ICD-10-CM | POA: Diagnosis not present

## 2020-02-19 DIAGNOSIS — E8779 Other fluid overload: Secondary | ICD-10-CM | POA: Diagnosis not present

## 2020-02-19 DIAGNOSIS — N2581 Secondary hyperparathyroidism of renal origin: Secondary | ICD-10-CM | POA: Diagnosis not present

## 2020-02-19 DIAGNOSIS — D631 Anemia in chronic kidney disease: Secondary | ICD-10-CM | POA: Diagnosis not present

## 2020-02-20 ENCOUNTER — Other Ambulatory Visit: Payer: Self-pay | Admitting: Family Medicine

## 2020-02-22 DIAGNOSIS — N186 End stage renal disease: Secondary | ICD-10-CM | POA: Diagnosis not present

## 2020-02-22 DIAGNOSIS — D631 Anemia in chronic kidney disease: Secondary | ICD-10-CM | POA: Diagnosis not present

## 2020-02-22 DIAGNOSIS — N2581 Secondary hyperparathyroidism of renal origin: Secondary | ICD-10-CM | POA: Diagnosis not present

## 2020-02-22 DIAGNOSIS — E8779 Other fluid overload: Secondary | ICD-10-CM | POA: Diagnosis not present

## 2020-02-23 ENCOUNTER — Ambulatory Visit: Payer: Medicare Other | Admitting: Internal Medicine

## 2020-02-24 DIAGNOSIS — N186 End stage renal disease: Secondary | ICD-10-CM | POA: Diagnosis not present

## 2020-02-24 DIAGNOSIS — N2581 Secondary hyperparathyroidism of renal origin: Secondary | ICD-10-CM | POA: Diagnosis not present

## 2020-02-24 DIAGNOSIS — E8779 Other fluid overload: Secondary | ICD-10-CM | POA: Diagnosis not present

## 2020-02-24 DIAGNOSIS — D631 Anemia in chronic kidney disease: Secondary | ICD-10-CM | POA: Diagnosis not present

## 2020-02-25 ENCOUNTER — Ambulatory Visit (INDEPENDENT_AMBULATORY_CARE_PROVIDER_SITE_OTHER): Payer: Medicare Other | Admitting: *Deleted

## 2020-02-25 DIAGNOSIS — Z0000 Encounter for general adult medical examination without abnormal findings: Secondary | ICD-10-CM

## 2020-02-25 NOTE — Progress Notes (Signed)
MEDICARE ANNUAL WELLNESS VISIT  02/25/2020  Telephone Visit Disclaimer This Medicare AWV was conducted by telephone due to national recommendations for restrictions regarding the COVID-19 Pandemic (e.g. social distancing).  I verified, using two identifiers, that I am speaking with Patrick Brown or their authorized healthcare agent. I discussed the limitations, risks, security, and privacy concerns of performing an evaluation and management service by telephone and the potential availability of an in-person appointment in the future. The patient expressed understanding and agreed to proceed.  Location of Patient: Home Location of Provider (nurse):  [facility]   Subjective:   lives with their family Patrick Brown is a 50 y.o. male patient of Dettinger, Fransisca Kaufmann, MD who had a Medicare Annual Wellness Visit today via telephone. Patrick Brown is Disabled and lives with his mom. He has 0 children. He reports that he is socially active and does interact with friends/family regularly. 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  Patient Care Team: Dettinger, Fransisca Kaufmann, MD as PCP - General (Family Medicine) Comer, Okey Regal, MD as PCP - Infectious Diseases (Infectious Diseases) Rankin, Clent Demark, MD (Ophthalmology) Chesley Mires, MD as Consulting Physician (Pulmonary Disease) Harriett Sine, MD as Consulting Physician (Dermatology) Regenia Skeeter, MD as Referring Physician (Internal Medicine)  Advanced Directives 02/25/2020 06/10/2016 05/09/2016 06/08/2015 10/06/2014 09/26/2014 06/30/2014  Does Patient Have a Medical Advance Directive? No No No No No No No  Type of Advance Directive - - - - - - -  Copy of Healthcare Power of Attorney in Chart? - - - - - - -  Would patient like information on creating a medical advance directive? No - Patient declined - - - - - No - patient  declined information  Pre-existing out of facility DNR order (yellow form or pink MOST form) - - - - - - -    Hospital Utilization Over the Past 12 Months: # of hospitalizations or ER visits: 6 # of surgeries: 0  Review of Systems    Patient reports that his overall health is unchanged compared to last year.  History obtained from chart review and the patient  Patient Reported Readings (BP, Pulse, CBG, Weight, etc) CBG 99  Pain Assessment Pain : No/denies pain     Current Medications & Allergies (verified) Allergies as of 02/25/2020      Reactions   Sulfa Antibiotics Other (See Comments)   High potassium   Ramipril Cough   Versed [midazolam] Other (See Comments)   "I don't wake up very good or clear it out of my system"      Medication List       Accurate as of February 25, 2020 10:03 AM. If you have any questions, ask your nurse or doctor.        acetaminophen 500 MG tablet Commonly known as: TYLENOL Take 1,000 mg by mouth 2 (two) times daily.   acyclovir 400 MG tablet Commonly known as: ZOVIRAX TAKE 1 TABLET (400 MG TOTAL) BY MOUTH 2 (TWO) TIMES DAILY.   AgaMatrix Ultra-Thin Lancets Misc TEST BS 4 TIMES A DAY AND AS NEEDED DX E10.65   albuterol 108 (90 Base) MCG/ACT inhaler Commonly known as: VENTOLIN HFA Inhale 2 puffs into the lungs every 6 (six) hours as needed for wheezing or shortness of breath.   aspirin 81 MG chewable tablet Chew 81 mg by  mouth every morning.   benzonatate 100 MG capsule Commonly known as: TESSALON Take 1 capsule (100 mg total) by mouth 2 (two) times daily as needed for cough.   cetirizine 10 MG tablet Commonly known as: ZYRTEC Take 1 tablet (10 mg total) by mouth daily.   cyclobenzaprine 10 MG tablet Commonly known as: FLEXERIL Take 1 tablet (10 mg total) by mouth 3 (three) times daily as needed for muscle spasms.   diclofenac sodium 1 % Gel Commonly known as: VOLTAREN Apply 2 g topically 4 (four) times daily.    diphenoxylate-atropine 2.5-0.025 MG tablet Commonly known as: Lomotil Take 1 tablet by mouth 4 (four) times daily as needed for diarrhea or loose stools.   Dovato 50-300 MG Tabs Generic drug: Dolutegravir-lamiVUDine Take 1 tablet by mouth daily.   DULoxetine 30 MG capsule Commonly known as: Cymbalta Take 1 capsule (30 mg total) by mouth daily. Take with the 44m for total of 90mg    DULoxetine 60 MG capsule Commonly known as: CYMBALTA Take 1 capsule (60 mg total) by mouth daily. Take with 30 mg fot a total of 90mg    esomeprazole 40 MG capsule Commonly known as: NEXIUM TAKE 1 CAPSULE BY MOUTH EVERY DAY   febuxostat 40 MG tablet Commonly known as: ULORIC TAKE 1 TABLET BY MOUTH EVERY DAY   ferrous sulfate 325 (65 FE) MG tablet Take 650 mg by mouth daily with breakfast.   fluticasone 50 MCG/ACT nasal spray Commonly known as: FLONASE SPRAY 2 SPRAYS INTO EACH NOSTRIL EVERY DAY   furosemide 40 MG tablet Commonly known as: LASIX Take 40 mg by mouth daily. 40 mg nightly   GLUCOSAMINE 1500 COMPLEX PO Take 1 tablet by mouth 2 (two) times daily.   glucosamine-chondroitin 500-400 MG tablet Take by mouth.   glucose blood test strip Commonly known as: OneTouch Verio TEST BLOOD SUGAR 4 TIMES DAILY AND AS NEEDED   hydrOXYzine 25 MG tablet Commonly known as: ATARAX/VISTARIL Take 1 tablet (25 mg total) by mouth every 8 (eight) hours as needed.   hyoscyamine 0.125 MG tablet Commonly known as: LEVSIN TAKE 1 TABLET (0.125 MG TOTAL) BY MOUTH EVERY 4 (FOUR) HOURS AS NEEDED.   insulin glargine 100 UNIT/ML injection Commonly known as: LANTUS In the event of insulin pump failure, inject 8 units twice daily   insulin lispro 100 UNIT/ML injection Commonly known as: HumaLOG USE 42 UNITS TO 120 UNITS PER PUMP DAILY AS DIRECTED   ipratropium 0.03 % nasal spray Commonly known as: ATROVENT USE 2 SPRAYS IN EACH NOSTRIL 2-3 TIMES DAILY   levothyroxine 175 MCG tablet Commonly known as:  SYNTHROID Take 1 tablet (175 mcg total) by mouth daily before breakfast.   lipase/protease/amylase 12000-38000 units Cpep capsule Commonly known as: CREON Take 2 capsules prior to meals and 1 capsule prior to snacks   losartan 50 MG tablet Commonly known as: COZAAR Take 50 mg by mouth daily. Takes when not on dialysis.   meclizine 12.5 MG tablet Commonly known as: ANTIVERT Take 1 tablet (12.5 mg total) by mouth 3 (three) times daily as needed for dizziness.   metoCLOPramide 5 MG tablet Commonly known as: REGLAN Take 1 tablet (5 mg total) by mouth 3 (three) times daily before meals.   Misc Intestinal Flora Regulat Caps Take 1 capsule by mouth every morning.   niacin 1000 MG CR tablet Commonly known as: NIASPAN TAKE 1 TABLET (1,000 MG TOTAL) BY MOUTH AT BEDTIME.   ondansetron 4 MG tablet Commonly known as: ZOFRAN Take 1  tablet (4 mg total) by mouth every 8 (eight) hours as needed for nausea.   promethazine 25 MG suppository Commonly known as: Phenergan Place 1 suppository (25 mg total) rectally every 6 (six) hours as needed for nausea or vomiting.   promethazine-dextromethorphan 6.25-15 MG/5ML syrup Commonly known as: PROMETHAZINE-DM Take 5 mLs by mouth 4 (four) times daily as needed for cough.   rosuvastatin 20 MG tablet Commonly known as: CRESTOR Take 20 mg by mouth at bedtime.   sevelamer carbonate 800 MG tablet Commonly known as: RENVELA Take 800 mg by mouth 3 (three) times daily. 2 tabs before each meal   tamsulosin 0.4 MG Caps capsule Commonly known as: FLOMAX Take 0.4 mg by mouth daily.   Testosterone 20.25 MG/ACT (1.62%) Gel APPLY 3 PUMPS DAILY AS DIRECTED   traZODone 50 MG tablet Commonly known as: DESYREL Take by mouth.   Vascepa 1 g capsule Generic drug: icosapent Ethyl TAKE 2 CAPSULES (2 G TOTAL) BY MOUTH 2 (TWO) TIMES DAILY.       History (reviewed): Past Medical History:  Diagnosis Date  . Anemia, iron deficiency On procrit  . CAP  (community acquired pneumonia)   . CKD (chronic kidney disease) stage 3, GFR 30-59 ml/min (HCC)   . Degenerative arthritis   . Depression   . Dyslipidemia   . Gastroesophageal reflux disease   . Gastroparesis diabeticorum ([place])   . Hematuria, microscopic 10/09   work up negative (Dr. Amalia Hailey)  . HIV positive ([place])   . 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 Hypothyroidism   . IDDM (insulin dependent diabetes mellitus)    38 years  . Low HDL (under 40)   . Proteinuria   . Retinopathy    x2  . SIRS (systemic inflammatory response syndrome) (HCC)    Past Surgical History:  Procedure Laterality Date  . CATARACT EXTRACTION Right   . COLONOSCOPY    . ESOPHAGOGASTRODUODENOSCOPY    . EYE SURGERY  2006,2001   x2   . HIP ARTHROPLASTY Right 05/10/2016   Procedure: RIGHT HIP HEMIARTHROPLASTY;  Surgeon: Marchia Bond, MD;  Location: [place];  Service: Orthopedics;  Laterality: Right;  . insulin pump    . LASIK Bilateral   . VIDEO BRONCHOSCOPY Bilateral 12/15/2012   Procedure: VIDEO BRONCHOSCOPY WITH FLUORO;  Surgeon: Kathee Delton, MD;  Location: [facility];  Service: Cardiopulmonary;  Laterality: Bilateral;  . VITRECTOMY  bilateral   Family History  Problem Relation Age of Onset  . Diabetes type I Brother   . Prostate cancer Other   . Dementia Other   . Dementia Other   . Dementia Other    Social History   Socioeconomic History  . Marital status: Single    Spouse name: Not on file  . Number of children: 0  . Years of education: AAS  . Highest education level: Not on file  Occupational History  . Occupation: EMT    Employer: [employer]: [employer]  Tobacco Use  . Smoking status: Never Smoker  . Smokeless tobacco: Never Used   Vaping Use  . Vaping Use: Never used  Substance and Sexual Activity  . Alcohol use: No    Alcohol/week: 0.0 standard drinks    Comment: Infrequent, less than 1 x per month.  . Drug use: No  .  Sexual activity: Not Currently    Comment: declined condoms  Other Topics Concern  . Not on file  Social History Narrative   Single.  Lives at home with mom.  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    Social Determinants of Health   Financial Resource Strain: Not on file  Food Insecurity: Not on file  Transportation Needs: Not on file  Physical Activity: Not on file  Stress: Not on file  Social Connections: Not on file    Activities of Daily Living In your present state of health, do you have any difficulty performing the following activities: 02/25/2020  Hearing? N  Vision? N  Comment Wears glasses  Difficulty concentrating or making decisions? N  Walking or climbing stairs? Y  Comment At times where he broke his hip 3 yeard ago  Preparing Food and eating ? N  Using the Toilet? N  In the past six months, have you accidently leaked urine? N  Do you have problems with loss of bowel control? N  Managing your Medications? N  Housekeeping or managing your Housekeeping? N  Some recent data might be hidden    Patient Education/ Literacy How often do you need to have someone help you when you read instructions, pamphlets, or other written materials from your doctor or pharmacy?: 1 - Never What is the last grade level you completed in school?: College  Exercise Current Exercise Habits: The patient does not participate in regular exercise at present, Exercise limited by: orthopedic condition(s)  Diet Patient reports consuming 2 meals a day and 1 snack(s) a day Patient reports that his primary diet is: Diabetic Patient reports that she does have regular access to food.   Depression Screen PHQ 2/9 Scores 02/25/2020 02/09/2020 10/08/2019 09/08/2019 08/06/2019 07/08/2019 01/07/2019  PHQ - 2 Score 0 0 0 0 0 0 0   PHQ- 9 Score - - - - - - -     Fall Risk Fall Risk  02/25/2020 02/09/2020 10/08/2019 09/08/2019 08/06/2019  Falls in the past year? 0 0 0 0 0  Number falls in past yr: - - - - -  Injury with Fall? - - - - -  Risk for fall due to : - - - No Fall Risks -  Follow up - - - Falls evaluation completed -     Objective:  Patrick Brown seemed alert and oriented and he participated appropriately during our telephone visit.  Blood Pressure Weight BMI  BP Readings from Last 3 Encounters:  02/09/20 (!) 159/78  12/22/19 108/68  10/08/19 106/71   Wt Readings from Last 3 Encounters:  02/09/20 203 lb (92.1 kg)  12/22/19 212 lb 12.8 oz (96.5 kg)  10/08/19 197 lb (89.4 kg)   BMI Readings from Last 1 Encounters:  02/09/20 29.13 kg/m    *Unable to obtain current vital signs, weight, and BMI due to telephone visit type  Hearing/Vision  . Patrick Brown did not seem to have difficulty with hearing/understanding during the telephone conversation . Reports that he has had a formal eye exam by an eye care professional within the past year . Reports that he has not had a formal hearing evaluation within the past year *Unable to fully assess hearing and vision during telephone visit type  Cognitive Function: 6CIT Screen 02/25/2020  What Year? 0 points  What month? 0 points  What time? 0 points  Count back from 20 0 points  Months in reverse 0 points  Repeat phrase 0 points  Total Score 0   (  Normal:0-7, Significant for Dysfunction: >8)  Normal Cognitive Function Screening: Yes   Immunization & Health Maintenance Record Immunization History  Administered Date(s) Administered  . Hepatitis A 12/23/2013, 09/05/2014, 11/18/2019  . Hepatitis A, Adult 12/23/2013, 09/05/2014  . Hepatitis B, adult 12/23/2013, 02/01/2014, 09/05/2014  . Hepatitis B, ped/adol 12/23/2013, 02/01/2014, 09/05/2014  . Influenza Split 11/05/2011  . Influenza,inj,Quad PF,6+ Mos 11/15/2013, 11/28/2014, 11/10/2015, 11/13/2016, 10/27/2017,  10/07/2018, 11/26/2019  . Influenza,inj,Quad PF,6-35 Mos 11/26/2019  . Influenza-Unspecified 11/12/2012  . Meningococcal Conjugate 03/26/2016, 10/27/2017  . Meningococcal Mcv4o 03/26/2016, 10/27/2017  . Moderna Sars-Covid-2 Vaccination 03/23/2019, 04/20/2019, 10/19/2019  . Pneumococcal Conjugate-13 10/27/2017  . Pneumococcal Polysaccharide-23 06/18/2012, 11/24/2018  . Tdap 11/27/2010, 11/18/2019  . Zoster Recombinat (Shingrix) 10/08/2019, 02/09/2020    Health Maintenance  Topic Date Due  . COLONOSCOPY (Pts 45-65yrs Insurance coverage will need to be confirmed)  Never done  . OPHTHALMOLOGY EXAM  11/02/2019  . HEMOGLOBIN A1C  04/06/2020  . COVID-19 Vaccine (4 - Booster for Moderna series) 04/17/2020  . FOOT EXAM  10/07/2020  . TETANUS/TDAP  11/17/2029  . INFLUENZA VACCINE  Completed  . PNEUMOCOCCAL POLYSACCHARIDE VACCINE AGE 77-64 HIGH RISK  Completed  . Hepatitis C Screening  Completed  . HIV Screening  Completed  . COLON CANCER SCREENING ANNUAL FOBT  Discontinued       Assessment  This is a routine wellness examination for [employer].  Health Maintenance: Due or Overdue Health Maintenance Due  Topic Date Due  . COLONOSCOPY (Pts 45-89yrs Insurance coverage will need to be confirmed)  Never done  . OPHTHALMOLOGY EXAM  11/02/2019    Patrick Brown does not need a referral for Community Assistance: Care Management:   no Social Work:    no Prescription Assistance:  no Nutrition/Diabetes Education:  no   Plan:  Personalized Goals Goals Addressed            This Visit's Progress   . AWV       02/25/2020 AWV Goal: Diabetes Management  . Patient will maintain an A1C level below 8.0 . Patient will not develop any diabetic foot complications . Patient will not experience any hypoglycemic episodes over the next 3 months . Patient will notify our office of any CBG readings outside of the provider recommended range by calling [phone] . Patient will adhere to provider  recommendations for diabetes management  Patient Self Management Activities . take all medications as prescribed and report any negative side effects . monitor and record blood sugar readings as directed . adhere to a low carbohydrate diet that incorporates lean proteins, vegetables, whole grains, low glycemic fruits . check feet daily noting any sores, cracks, injuries, or callous formations . see PCP or podiatrist if he notices any changes in his legs, feet, or toenails . Patient will visit PCP and have an A1C level checked every 3 to 6 months as directed  . have a yearly eye exam to monitor for vascular changes associated with diabetes and will request that the report be sent to his pcp.  . consult with his PCP regarding any changes in his health or new or worsening symptoms       Personalized Health Maintenance & Screening Recommendations  Patient is up to date at this time- We are going to get colonoscopy report and last eye exam report.   Lung Cancer Screening Recommended: no (Low Dose CT Chest recommended if Age 12-80 years, 30 pack-year currently smoking OR have quit w/in past 15 years) Hepatitis C Screening recommended:  no HIV Screening recommended: no  Advanced Directives: Written information was not prepared per patient's request.  Referrals & Orders No orders of the defined types were placed in this encounter.   Follow-up Plan . Follow-up with Dettinger, Fransisca Kaufmann, MD as planned . AVS Printed and mailed to patient    I have personally reviewed and noted the following in the patient's chart:   . Medical and social history . Use of alcohol, tobacco or illicit drugs  . Current medications and supplements . Functional ability and status . Nutritional status . Physical activity . Advanced directives . List of other physicians . Hospitalizations, surgeries, and ER visits in previous 12 months . Vitals . Screenings to include cognitive, depression, and  falls . Referrals and appointments  In addition, I have reviewed and discussed with Patrick Brown certain preventive protocols, quality metrics, and best practice recommendations. A written personalized care plan for preventive services as well as general preventive health recommendations is available and can be mailed to the patient at his request.      Gareth Morgan  02/25/2020

## 2020-02-25 NOTE — Patient Instructions (Signed)
  [place] Maintenance Summary and Written Plan of Care  [name] ,  Thank you for allowing me to perform your Medicare Annual Wellness Visit and for your ongoing commitment to your health.   Health Maintenance & Immunization History Health Maintenance  Topic Date Due  . COLONOSCOPY (Pts 45-46yrs Insurance coverage will need to be confirmed)  Never done  . OPHTHALMOLOGY EXAM  11/02/2019  . HEMOGLOBIN A1C  04/06/2020  . COVID-19 Vaccine (4 - Booster for Moderna series) 04/17/2020  . FOOT EXAM  10/07/2020  . TETANUS/TDAP  11/17/2029  . INFLUENZA VACCINE  Completed  . PNEUMOCOCCAL POLYSACCHARIDE VACCINE AGE 61-64 HIGH RISK  Completed  . Hepatitis C Screening  Completed  . HIV Screening  Completed  . COLON CANCER SCREENING ANNUAL FOBT  Discontinued   Immunization History  Administered Date(s) Administered  . Hepatitis A 12/23/2013, 09/05/2014, 11/18/2019  . Hepatitis A, Adult 12/23/2013, 09/05/2014  . Hepatitis B, adult 12/23/2013, 02/01/2014, 09/05/2014  . Hepatitis B, ped/adol 12/23/2013, 02/01/2014, 09/05/2014  . Influenza Split 11/05/2011  . Influenza,inj,Quad PF,6+ Mos 11/15/2013, 11/28/2014, 11/10/2015, 11/13/2016, 10/27/2017, 10/07/2018, 11/26/2019  . Influenza,inj,Quad PF,6-35 Mos 11/26/2019  . Influenza-Unspecified 11/12/2012  . Meningococcal Conjugate 03/26/2016, 10/27/2017  . Meningococcal Mcv4o 03/26/2016, 10/27/2017  . Moderna Sars-Covid-2 Vaccination 03/23/2019, 04/20/2019, 10/19/2019  . Pneumococcal Conjugate-13 10/27/2017  . Pneumococcal Polysaccharide-23 06/18/2012, 11/24/2018  . Tdap 11/27/2010, 11/18/2019  . Zoster Recombinat (Shingrix) 10/08/2019, 02/09/2020    These are the patient goals that we discussed: Goals Addressed            This Visit's Progress   . AWV       02/25/2020 AWV Goal: Diabetes Management  . Patient will maintain an A1C level below 8.0 . Patient will not develop any diabetic foot  complications . Patient will not experience any hypoglycemic episodes over the next 3 months . Patient will notify our office of any CBG readings outside of the provider recommended range by calling [phone] . Patient will adhere to provider recommendations for diabetes management  Patient Self Management Activities . take all medications as prescribed and report any negative side effects . monitor and record blood sugar readings as directed . adhere to a low carbohydrate diet that incorporates lean proteins, vegetables, whole grains, low glycemic fruits . check feet daily noting any sores, cracks, injuries, or callous formations . see PCP or podiatrist if he notices any changes in his legs, feet, or toenails . Patient will visit PCP and have an A1C level checked every 3 to 6 months as directed  . have a yearly eye exam to monitor for vascular changes associated with diabetes and will request that the report be sent to his pcp.  . consult with his PCP regarding any changes in his health or new or worsening symptoms         This is a list of Health Maintenance Items that are overdue or due now: Health Maintenance Due  Topic Date Due  . COLONOSCOPY (Pts 45-35yrs Insurance coverage will need to be confirmed)  Never done  . OPHTHALMOLOGY EXAM  11/02/2019     Orders/Referrals Placed Today: No orders of the defined types were placed in this encounter.  (Contact our referral department at [phone] if you have not spoken with someone about your referral appointment within the next 5 days)    Follow-up Plan Follow-up with Dettinger, Fransisca Kaufmann, MD as planned

## 2020-02-26 DIAGNOSIS — E8779 Other fluid overload: Secondary | ICD-10-CM | POA: Diagnosis not present

## 2020-02-26 DIAGNOSIS — D631 Anemia in chronic kidney disease: Secondary | ICD-10-CM | POA: Diagnosis not present

## 2020-02-26 DIAGNOSIS — N186 End stage renal disease: Secondary | ICD-10-CM | POA: Diagnosis not present

## 2020-02-26 DIAGNOSIS — N2581 Secondary hyperparathyroidism of renal origin: Secondary | ICD-10-CM | POA: Diagnosis not present

## 2020-02-29 DIAGNOSIS — N186 End stage renal disease: Secondary | ICD-10-CM | POA: Diagnosis not present

## 2020-02-29 DIAGNOSIS — E8779 Other fluid overload: Secondary | ICD-10-CM | POA: Diagnosis not present

## 2020-02-29 DIAGNOSIS — D631 Anemia in chronic kidney disease: Secondary | ICD-10-CM | POA: Diagnosis not present

## 2020-02-29 DIAGNOSIS — N2581 Secondary hyperparathyroidism of renal origin: Secondary | ICD-10-CM | POA: Diagnosis not present

## 2020-03-01 ENCOUNTER — Ambulatory Visit: Payer: Medicare Other | Admitting: Internal Medicine

## 2020-03-02 ENCOUNTER — Other Ambulatory Visit: Payer: Self-pay | Admitting: Family Medicine

## 2020-03-02 DIAGNOSIS — N186 End stage renal disease: Secondary | ICD-10-CM | POA: Diagnosis not present

## 2020-03-02 DIAGNOSIS — E8779 Other fluid overload: Secondary | ICD-10-CM | POA: Diagnosis not present

## 2020-03-02 DIAGNOSIS — N2581 Secondary hyperparathyroidism of renal origin: Secondary | ICD-10-CM | POA: Diagnosis not present

## 2020-03-02 DIAGNOSIS — D631 Anemia in chronic kidney disease: Secondary | ICD-10-CM | POA: Diagnosis not present

## 2020-03-03 ENCOUNTER — Ambulatory Visit: Payer: Medicare Other | Admitting: Family Medicine

## 2020-03-04 DIAGNOSIS — N2581 Secondary hyperparathyroidism of renal origin: Secondary | ICD-10-CM | POA: Diagnosis not present

## 2020-03-04 DIAGNOSIS — N186 End stage renal disease: Secondary | ICD-10-CM | POA: Diagnosis not present

## 2020-03-04 DIAGNOSIS — E8779 Other fluid overload: Secondary | ICD-10-CM | POA: Diagnosis not present

## 2020-03-04 DIAGNOSIS — D631 Anemia in chronic kidney disease: Secondary | ICD-10-CM | POA: Diagnosis not present

## 2020-03-06 DIAGNOSIS — Z992 Dependence on renal dialysis: Secondary | ICD-10-CM | POA: Diagnosis not present

## 2020-03-06 DIAGNOSIS — N186 End stage renal disease: Secondary | ICD-10-CM | POA: Diagnosis not present

## 2020-03-07 DIAGNOSIS — E8779 Other fluid overload: Secondary | ICD-10-CM | POA: Diagnosis not present

## 2020-03-07 DIAGNOSIS — D509 Iron deficiency anemia, unspecified: Secondary | ICD-10-CM | POA: Diagnosis not present

## 2020-03-07 DIAGNOSIS — N2581 Secondary hyperparathyroidism of renal origin: Secondary | ICD-10-CM | POA: Diagnosis not present

## 2020-03-07 DIAGNOSIS — N186 End stage renal disease: Secondary | ICD-10-CM | POA: Diagnosis not present

## 2020-03-07 DIAGNOSIS — D631 Anemia in chronic kidney disease: Secondary | ICD-10-CM | POA: Diagnosis not present

## 2020-03-08 ENCOUNTER — Encounter: Payer: Self-pay | Admitting: Internal Medicine

## 2020-03-08 ENCOUNTER — Ambulatory Visit (INDEPENDENT_AMBULATORY_CARE_PROVIDER_SITE_OTHER): Payer: Medicare Other | Admitting: Internal Medicine

## 2020-03-08 ENCOUNTER — Other Ambulatory Visit: Payer: Self-pay

## 2020-03-08 VITALS — Temp 98.0°F | Ht 69.0 in | Wt 192.0 lb

## 2020-03-08 DIAGNOSIS — Z113 Encounter for screening for infections with a predominantly sexual mode of transmission: Secondary | ICD-10-CM

## 2020-03-08 DIAGNOSIS — B20 Human immunodeficiency virus [HIV] disease: Secondary | ICD-10-CM

## 2020-03-08 NOTE — Progress Notes (Signed)
   Subjective:    Patient ID: Dannis Deroche, male    DOB: 03/30/1970, 50 y.o.   MRN: [id number]  HPI Here for follow up for HIV He had been on Biktarvy but since starting on the evaluation for renal transplant, I have changed him to [place].  He is tolerating this well with no issues.  He is followed at [facility].  He was recently hospitalized at [facility] for hyperglycemia but mother thinks it was pneumonia due to his cough.  He later went to another OSH and was diagnosed with pneumonia and given antibiotics.  He still has some lingering cough but no fever, no sob.     Review of Systems  Constitutional: Negative for fatigue.  Gastrointestinal: Negative for diarrhea and nausea.  Skin: Negative for rash.       Objective:   Physical Exam Constitutional:      Appearance: Normal appearance.  Eyes:     General: No scleral icterus. Cardiovascular:     Rate and Rhythm: Normal rate and regular rhythm.  Pulmonary:     Effort: Pulmonary effort is normal.  Neurological:     Mental Status: He is alert.  Psychiatric:        Mood and Affect: Mood normal.          Assessment & Plan:

## 2020-03-08 NOTE — Assessment & Plan Note (Signed)
He is doing well with no issues with the change of regimen.  I will check his labs today and he will rtc in 6 months.

## 2020-03-08 NOTE — Assessment & Plan Note (Signed)
Will screen today 

## 2020-03-09 DIAGNOSIS — N2581 Secondary hyperparathyroidism of renal origin: Secondary | ICD-10-CM | POA: Diagnosis not present

## 2020-03-09 DIAGNOSIS — E8779 Other fluid overload: Secondary | ICD-10-CM | POA: Diagnosis not present

## 2020-03-09 DIAGNOSIS — E119 Type 2 diabetes mellitus without complications: Secondary | ICD-10-CM | POA: Diagnosis not present

## 2020-03-09 DIAGNOSIS — D631 Anemia in chronic kidney disease: Secondary | ICD-10-CM | POA: Diagnosis not present

## 2020-03-09 DIAGNOSIS — D509 Iron deficiency anemia, unspecified: Secondary | ICD-10-CM | POA: Diagnosis not present

## 2020-03-09 DIAGNOSIS — N186 End stage renal disease: Secondary | ICD-10-CM | POA: Diagnosis not present

## 2020-03-09 LAB — T-HELPER CELL (CD4) - (RCID CLINIC ONLY)
CD4 % Helper T Cell: 23 % — ABNORMAL LOW (ref 33–65)
CD4 T Cell Abs: 437 /uL (ref 400–1790)

## 2020-03-10 LAB — HIV-1 RNA QUANT-NO REFLEX-BLD
HIV 1 RNA Quant: <20 Copies/mL — ABNORMAL HIGH
HIV-1 RNA Quant, Log: <1.3 Log cps/mL — ABNORMAL HIGH

## 2020-03-10 LAB — RPR: RPR Ser Ql: NONREACTIVE

## 2020-03-11 DIAGNOSIS — N186 End stage renal disease: Secondary | ICD-10-CM | POA: Diagnosis not present

## 2020-03-11 DIAGNOSIS — E8779 Other fluid overload: Secondary | ICD-10-CM | POA: Diagnosis not present

## 2020-03-11 DIAGNOSIS — D509 Iron deficiency anemia, unspecified: Secondary | ICD-10-CM | POA: Diagnosis not present

## 2020-03-11 DIAGNOSIS — N2581 Secondary hyperparathyroidism of renal origin: Secondary | ICD-10-CM | POA: Diagnosis not present

## 2020-03-11 DIAGNOSIS — D631 Anemia in chronic kidney disease: Secondary | ICD-10-CM | POA: Diagnosis not present

## 2020-03-14 DIAGNOSIS — N186 End stage renal disease: Secondary | ICD-10-CM | POA: Diagnosis not present

## 2020-03-14 DIAGNOSIS — Z992 Dependence on renal dialysis: Secondary | ICD-10-CM | POA: Diagnosis not present

## 2020-03-14 DIAGNOSIS — Z79899 Other long term (current) drug therapy: Secondary | ICD-10-CM | POA: Diagnosis not present

## 2020-03-14 DIAGNOSIS — N2581 Secondary hyperparathyroidism of renal origin: Secondary | ICD-10-CM | POA: Diagnosis not present

## 2020-03-14 DIAGNOSIS — I120 Hypertensive chronic kidney disease with stage 5 chronic kidney disease or end stage renal disease: Secondary | ICD-10-CM | POA: Diagnosis not present

## 2020-03-14 DIAGNOSIS — D631 Anemia in chronic kidney disease: Secondary | ICD-10-CM | POA: Diagnosis not present

## 2020-03-14 DIAGNOSIS — E785 Hyperlipidemia, unspecified: Secondary | ICD-10-CM | POA: Diagnosis not present

## 2020-03-14 DIAGNOSIS — E10319 Type 1 diabetes mellitus with unspecified diabetic retinopathy without macular edema: Secondary | ICD-10-CM | POA: Diagnosis not present

## 2020-03-14 DIAGNOSIS — E1022 Type 1 diabetes mellitus with diabetic chronic kidney disease: Secondary | ICD-10-CM | POA: Diagnosis not present

## 2020-03-14 DIAGNOSIS — Z9641 Presence of insulin pump (external) (internal): Secondary | ICD-10-CM | POA: Diagnosis not present

## 2020-03-14 DIAGNOSIS — D509 Iron deficiency anemia, unspecified: Secondary | ICD-10-CM | POA: Diagnosis not present

## 2020-03-14 DIAGNOSIS — E8779 Other fluid overload: Secondary | ICD-10-CM | POA: Diagnosis not present

## 2020-03-15 ENCOUNTER — Other Ambulatory Visit: Payer: Self-pay | Admitting: Family Medicine

## 2020-03-15 DIAGNOSIS — E109 Type 1 diabetes mellitus without complications: Secondary | ICD-10-CM | POA: Diagnosis not present

## 2020-03-15 DIAGNOSIS — E1022 Type 1 diabetes mellitus with diabetic chronic kidney disease: Secondary | ICD-10-CM | POA: Diagnosis not present

## 2020-03-16 DIAGNOSIS — D631 Anemia in chronic kidney disease: Secondary | ICD-10-CM | POA: Diagnosis not present

## 2020-03-16 DIAGNOSIS — E8779 Other fluid overload: Secondary | ICD-10-CM | POA: Diagnosis not present

## 2020-03-16 DIAGNOSIS — N186 End stage renal disease: Secondary | ICD-10-CM | POA: Diagnosis not present

## 2020-03-16 DIAGNOSIS — N2581 Secondary hyperparathyroidism of renal origin: Secondary | ICD-10-CM | POA: Diagnosis not present

## 2020-03-16 DIAGNOSIS — D509 Iron deficiency anemia, unspecified: Secondary | ICD-10-CM | POA: Diagnosis not present

## 2020-03-17 DIAGNOSIS — D510 Vitamin B12 deficiency anemia due to intrinsic factor deficiency: Secondary | ICD-10-CM | POA: Diagnosis not present

## 2020-03-18 DIAGNOSIS — D509 Iron deficiency anemia, unspecified: Secondary | ICD-10-CM | POA: Diagnosis not present

## 2020-03-18 DIAGNOSIS — N2581 Secondary hyperparathyroidism of renal origin: Secondary | ICD-10-CM | POA: Diagnosis not present

## 2020-03-18 DIAGNOSIS — N186 End stage renal disease: Secondary | ICD-10-CM | POA: Diagnosis not present

## 2020-03-18 DIAGNOSIS — E8779 Other fluid overload: Secondary | ICD-10-CM | POA: Diagnosis not present

## 2020-03-18 DIAGNOSIS — D631 Anemia in chronic kidney disease: Secondary | ICD-10-CM | POA: Diagnosis not present

## 2020-03-20 ENCOUNTER — Other Ambulatory Visit: Payer: Self-pay | Admitting: Family Medicine

## 2020-03-21 DIAGNOSIS — N2581 Secondary hyperparathyroidism of renal origin: Secondary | ICD-10-CM | POA: Diagnosis not present

## 2020-03-21 DIAGNOSIS — D509 Iron deficiency anemia, unspecified: Secondary | ICD-10-CM | POA: Diagnosis not present

## 2020-03-21 DIAGNOSIS — E8779 Other fluid overload: Secondary | ICD-10-CM | POA: Diagnosis not present

## 2020-03-21 DIAGNOSIS — D631 Anemia in chronic kidney disease: Secondary | ICD-10-CM | POA: Diagnosis not present

## 2020-03-21 DIAGNOSIS — N186 End stage renal disease: Secondary | ICD-10-CM | POA: Diagnosis not present

## 2020-03-23 DIAGNOSIS — E8779 Other fluid overload: Secondary | ICD-10-CM | POA: Diagnosis not present

## 2020-03-23 DIAGNOSIS — N186 End stage renal disease: Secondary | ICD-10-CM | POA: Diagnosis not present

## 2020-03-23 DIAGNOSIS — D631 Anemia in chronic kidney disease: Secondary | ICD-10-CM | POA: Diagnosis not present

## 2020-03-23 DIAGNOSIS — D509 Iron deficiency anemia, unspecified: Secondary | ICD-10-CM | POA: Diagnosis not present

## 2020-03-23 DIAGNOSIS — N2581 Secondary hyperparathyroidism of renal origin: Secondary | ICD-10-CM | POA: Diagnosis not present

## 2020-03-24 ENCOUNTER — Other Ambulatory Visit: Payer: Self-pay | Admitting: Family Medicine

## 2020-03-25 DIAGNOSIS — N2581 Secondary hyperparathyroidism of renal origin: Secondary | ICD-10-CM | POA: Diagnosis not present

## 2020-03-25 DIAGNOSIS — N186 End stage renal disease: Secondary | ICD-10-CM | POA: Diagnosis not present

## 2020-03-25 DIAGNOSIS — D631 Anemia in chronic kidney disease: Secondary | ICD-10-CM | POA: Diagnosis not present

## 2020-03-25 DIAGNOSIS — E8779 Other fluid overload: Secondary | ICD-10-CM | POA: Diagnosis not present

## 2020-03-25 DIAGNOSIS — D509 Iron deficiency anemia, unspecified: Secondary | ICD-10-CM | POA: Diagnosis not present

## 2020-03-28 DIAGNOSIS — D509 Iron deficiency anemia, unspecified: Secondary | ICD-10-CM | POA: Diagnosis not present

## 2020-03-28 DIAGNOSIS — E8779 Other fluid overload: Secondary | ICD-10-CM | POA: Diagnosis not present

## 2020-03-28 DIAGNOSIS — N185 Chronic kidney disease, stage 5: Secondary | ICD-10-CM | POA: Diagnosis not present

## 2020-03-28 DIAGNOSIS — E1022 Type 1 diabetes mellitus with diabetic chronic kidney disease: Secondary | ICD-10-CM | POA: Diagnosis not present

## 2020-03-28 DIAGNOSIS — N2581 Secondary hyperparathyroidism of renal origin: Secondary | ICD-10-CM | POA: Diagnosis not present

## 2020-03-28 DIAGNOSIS — N186 End stage renal disease: Secondary | ICD-10-CM | POA: Diagnosis not present

## 2020-03-28 DIAGNOSIS — D631 Anemia in chronic kidney disease: Secondary | ICD-10-CM | POA: Diagnosis not present

## 2020-03-30 DIAGNOSIS — N2581 Secondary hyperparathyroidism of renal origin: Secondary | ICD-10-CM | POA: Diagnosis not present

## 2020-03-30 DIAGNOSIS — D631 Anemia in chronic kidney disease: Secondary | ICD-10-CM | POA: Diagnosis not present

## 2020-03-30 DIAGNOSIS — E8779 Other fluid overload: Secondary | ICD-10-CM | POA: Diagnosis not present

## 2020-03-30 DIAGNOSIS — D509 Iron deficiency anemia, unspecified: Secondary | ICD-10-CM | POA: Diagnosis not present

## 2020-03-30 DIAGNOSIS — N186 End stage renal disease: Secondary | ICD-10-CM | POA: Diagnosis not present

## 2020-04-01 DIAGNOSIS — E8779 Other fluid overload: Secondary | ICD-10-CM | POA: Diagnosis not present

## 2020-04-01 DIAGNOSIS — N2581 Secondary hyperparathyroidism of renal origin: Secondary | ICD-10-CM | POA: Diagnosis not present

## 2020-04-01 DIAGNOSIS — N186 End stage renal disease: Secondary | ICD-10-CM | POA: Diagnosis not present

## 2020-04-01 DIAGNOSIS — Z1159 Encounter for screening for other viral diseases: Secondary | ICD-10-CM | POA: Diagnosis not present

## 2020-04-01 DIAGNOSIS — D509 Iron deficiency anemia, unspecified: Secondary | ICD-10-CM | POA: Diagnosis not present

## 2020-04-01 DIAGNOSIS — D631 Anemia in chronic kidney disease: Secondary | ICD-10-CM | POA: Diagnosis not present

## 2020-04-02 ENCOUNTER — Encounter: Payer: Self-pay | Admitting: Family Medicine

## 2020-04-03 ENCOUNTER — Other Ambulatory Visit: Payer: Self-pay

## 2020-04-03 DIAGNOSIS — Z992 Dependence on renal dialysis: Secondary | ICD-10-CM | POA: Diagnosis not present

## 2020-04-03 DIAGNOSIS — N186 End stage renal disease: Secondary | ICD-10-CM | POA: Diagnosis not present

## 2020-04-03 DIAGNOSIS — J301 Allergic rhinitis due to pollen: Secondary | ICD-10-CM

## 2020-04-03 MED ORDER — FLUTICASONE PROPIONATE 50 MCG/ACT NA SUSP: NASAL | 1 refills | Status: DC

## 2020-04-04 DIAGNOSIS — N186 End stage renal disease: Secondary | ICD-10-CM | POA: Diagnosis not present

## 2020-04-04 DIAGNOSIS — N2581 Secondary hyperparathyroidism of renal origin: Secondary | ICD-10-CM | POA: Diagnosis not present

## 2020-04-04 DIAGNOSIS — E213 Hyperparathyroidism, unspecified: Secondary | ICD-10-CM | POA: Diagnosis not present

## 2020-04-04 DIAGNOSIS — D631 Anemia in chronic kidney disease: Secondary | ICD-10-CM | POA: Diagnosis not present

## 2020-04-04 DIAGNOSIS — E8779 Other fluid overload: Secondary | ICD-10-CM | POA: Diagnosis not present

## 2020-04-04 DIAGNOSIS — D509 Iron deficiency anemia, unspecified: Secondary | ICD-10-CM | POA: Diagnosis not present

## 2020-04-06 DIAGNOSIS — Z1159 Encounter for screening for other viral diseases: Secondary | ICD-10-CM | POA: Diagnosis not present

## 2020-04-06 DIAGNOSIS — N2581 Secondary hyperparathyroidism of renal origin: Secondary | ICD-10-CM | POA: Diagnosis not present

## 2020-04-06 DIAGNOSIS — E8779 Other fluid overload: Secondary | ICD-10-CM | POA: Diagnosis not present

## 2020-04-06 DIAGNOSIS — E213 Hyperparathyroidism, unspecified: Secondary | ICD-10-CM | POA: Diagnosis not present

## 2020-04-06 DIAGNOSIS — D631 Anemia in chronic kidney disease: Secondary | ICD-10-CM | POA: Diagnosis not present

## 2020-04-06 DIAGNOSIS — E119 Type 2 diabetes mellitus without complications: Secondary | ICD-10-CM | POA: Diagnosis not present

## 2020-04-06 DIAGNOSIS — D509 Iron deficiency anemia, unspecified: Secondary | ICD-10-CM | POA: Diagnosis not present

## 2020-04-06 DIAGNOSIS — N186 End stage renal disease: Secondary | ICD-10-CM | POA: Diagnosis not present

## 2020-04-08 DIAGNOSIS — D631 Anemia in chronic kidney disease: Secondary | ICD-10-CM | POA: Diagnosis not present

## 2020-04-08 DIAGNOSIS — N186 End stage renal disease: Secondary | ICD-10-CM | POA: Diagnosis not present

## 2020-04-08 DIAGNOSIS — E213 Hyperparathyroidism, unspecified: Secondary | ICD-10-CM | POA: Diagnosis not present

## 2020-04-08 DIAGNOSIS — D509 Iron deficiency anemia, unspecified: Secondary | ICD-10-CM | POA: Diagnosis not present

## 2020-04-08 DIAGNOSIS — E8779 Other fluid overload: Secondary | ICD-10-CM | POA: Diagnosis not present

## 2020-04-08 DIAGNOSIS — N2581 Secondary hyperparathyroidism of renal origin: Secondary | ICD-10-CM | POA: Diagnosis not present

## 2020-04-09 ENCOUNTER — Other Ambulatory Visit: Payer: Self-pay | Admitting: Family Medicine

## 2020-04-10 ENCOUNTER — Encounter: Payer: Self-pay | Admitting: Family Medicine

## 2020-04-11 DIAGNOSIS — I2510 Atherosclerotic heart disease of native coronary artery without angina pectoris: Secondary | ICD-10-CM | POA: Diagnosis not present

## 2020-04-11 DIAGNOSIS — E8779 Other fluid overload: Secondary | ICD-10-CM | POA: Diagnosis not present

## 2020-04-11 DIAGNOSIS — N2581 Secondary hyperparathyroidism of renal origin: Secondary | ICD-10-CM | POA: Diagnosis not present

## 2020-04-11 DIAGNOSIS — E213 Hyperparathyroidism, unspecified: Secondary | ICD-10-CM | POA: Diagnosis not present

## 2020-04-11 DIAGNOSIS — D509 Iron deficiency anemia, unspecified: Secondary | ICD-10-CM | POA: Diagnosis not present

## 2020-04-11 DIAGNOSIS — N186 End stage renal disease: Secondary | ICD-10-CM | POA: Diagnosis not present

## 2020-04-11 DIAGNOSIS — D631 Anemia in chronic kidney disease: Secondary | ICD-10-CM | POA: Diagnosis not present

## 2020-04-11 DIAGNOSIS — Z01818 Encounter for other preprocedural examination: Secondary | ICD-10-CM | POA: Diagnosis not present

## 2020-04-12 DIAGNOSIS — D510 Vitamin B12 deficiency anemia due to intrinsic factor deficiency: Secondary | ICD-10-CM | POA: Diagnosis not present

## 2020-04-12 DIAGNOSIS — R339 Retention of urine, unspecified: Secondary | ICD-10-CM | POA: Diagnosis not present

## 2020-04-12 DIAGNOSIS — R3914 Feeling of incomplete bladder emptying: Secondary | ICD-10-CM | POA: Diagnosis not present

## 2020-04-12 DIAGNOSIS — N184 Chronic kidney disease, stage 4 (severe): Secondary | ICD-10-CM | POA: Diagnosis not present

## 2020-04-12 DIAGNOSIS — N186 End stage renal disease: Secondary | ICD-10-CM | POA: Diagnosis not present

## 2020-04-12 DIAGNOSIS — I2510 Atherosclerotic heart disease of native coronary artery without angina pectoris: Secondary | ICD-10-CM | POA: Diagnosis not present

## 2020-04-12 DIAGNOSIS — Z01818 Encounter for other preprocedural examination: Secondary | ICD-10-CM | POA: Diagnosis not present

## 2020-04-13 ENCOUNTER — Other Ambulatory Visit: Payer: Self-pay | Admitting: Family Medicine

## 2020-04-13 DIAGNOSIS — D631 Anemia in chronic kidney disease: Secondary | ICD-10-CM | POA: Diagnosis not present

## 2020-04-13 DIAGNOSIS — E213 Hyperparathyroidism, unspecified: Secondary | ICD-10-CM | POA: Diagnosis not present

## 2020-04-13 DIAGNOSIS — D509 Iron deficiency anemia, unspecified: Secondary | ICD-10-CM | POA: Diagnosis not present

## 2020-04-13 DIAGNOSIS — E8779 Other fluid overload: Secondary | ICD-10-CM | POA: Diagnosis not present

## 2020-04-13 DIAGNOSIS — N186 End stage renal disease: Secondary | ICD-10-CM | POA: Diagnosis not present

## 2020-04-13 DIAGNOSIS — N2581 Secondary hyperparathyroidism of renal origin: Secondary | ICD-10-CM | POA: Diagnosis not present

## 2020-04-15 DIAGNOSIS — E8779 Other fluid overload: Secondary | ICD-10-CM | POA: Diagnosis not present

## 2020-04-15 DIAGNOSIS — N2581 Secondary hyperparathyroidism of renal origin: Secondary | ICD-10-CM | POA: Diagnosis not present

## 2020-04-15 DIAGNOSIS — E213 Hyperparathyroidism, unspecified: Secondary | ICD-10-CM | POA: Diagnosis not present

## 2020-04-15 DIAGNOSIS — D631 Anemia in chronic kidney disease: Secondary | ICD-10-CM | POA: Diagnosis not present

## 2020-04-15 DIAGNOSIS — D509 Iron deficiency anemia, unspecified: Secondary | ICD-10-CM | POA: Diagnosis not present

## 2020-04-15 DIAGNOSIS — N186 End stage renal disease: Secondary | ICD-10-CM | POA: Diagnosis not present

## 2020-04-18 DIAGNOSIS — E8779 Other fluid overload: Secondary | ICD-10-CM | POA: Diagnosis not present

## 2020-04-18 DIAGNOSIS — E213 Hyperparathyroidism, unspecified: Secondary | ICD-10-CM | POA: Diagnosis not present

## 2020-04-18 DIAGNOSIS — N2581 Secondary hyperparathyroidism of renal origin: Secondary | ICD-10-CM | POA: Diagnosis not present

## 2020-04-18 DIAGNOSIS — D509 Iron deficiency anemia, unspecified: Secondary | ICD-10-CM | POA: Diagnosis not present

## 2020-04-18 DIAGNOSIS — N186 End stage renal disease: Secondary | ICD-10-CM | POA: Diagnosis not present

## 2020-04-18 DIAGNOSIS — D631 Anemia in chronic kidney disease: Secondary | ICD-10-CM | POA: Diagnosis not present

## 2020-04-20 DIAGNOSIS — E8779 Other fluid overload: Secondary | ICD-10-CM | POA: Diagnosis not present

## 2020-04-20 DIAGNOSIS — D509 Iron deficiency anemia, unspecified: Secondary | ICD-10-CM | POA: Diagnosis not present

## 2020-04-20 DIAGNOSIS — E213 Hyperparathyroidism, unspecified: Secondary | ICD-10-CM | POA: Diagnosis not present

## 2020-04-20 DIAGNOSIS — N2581 Secondary hyperparathyroidism of renal origin: Secondary | ICD-10-CM | POA: Diagnosis not present

## 2020-04-20 DIAGNOSIS — N186 End stage renal disease: Secondary | ICD-10-CM | POA: Diagnosis not present

## 2020-04-20 DIAGNOSIS — D631 Anemia in chronic kidney disease: Secondary | ICD-10-CM | POA: Diagnosis not present

## 2020-04-22 DIAGNOSIS — E213 Hyperparathyroidism, unspecified: Secondary | ICD-10-CM | POA: Diagnosis not present

## 2020-04-22 DIAGNOSIS — D631 Anemia in chronic kidney disease: Secondary | ICD-10-CM | POA: Diagnosis not present

## 2020-04-22 DIAGNOSIS — D509 Iron deficiency anemia, unspecified: Secondary | ICD-10-CM | POA: Diagnosis not present

## 2020-04-22 DIAGNOSIS — N2581 Secondary hyperparathyroidism of renal origin: Secondary | ICD-10-CM | POA: Diagnosis not present

## 2020-04-22 DIAGNOSIS — N186 End stage renal disease: Secondary | ICD-10-CM | POA: Diagnosis not present

## 2020-04-22 DIAGNOSIS — E8779 Other fluid overload: Secondary | ICD-10-CM | POA: Diagnosis not present

## 2020-04-23 ENCOUNTER — Other Ambulatory Visit: Payer: Self-pay | Admitting: Family Medicine

## 2020-04-25 DIAGNOSIS — D509 Iron deficiency anemia, unspecified: Secondary | ICD-10-CM | POA: Diagnosis not present

## 2020-04-25 DIAGNOSIS — D631 Anemia in chronic kidney disease: Secondary | ICD-10-CM | POA: Diagnosis not present

## 2020-04-25 DIAGNOSIS — E213 Hyperparathyroidism, unspecified: Secondary | ICD-10-CM | POA: Diagnosis not present

## 2020-04-25 DIAGNOSIS — N186 End stage renal disease: Secondary | ICD-10-CM | POA: Diagnosis not present

## 2020-04-25 DIAGNOSIS — N2581 Secondary hyperparathyroidism of renal origin: Secondary | ICD-10-CM | POA: Diagnosis not present

## 2020-04-25 DIAGNOSIS — E8779 Other fluid overload: Secondary | ICD-10-CM | POA: Diagnosis not present

## 2020-04-27 DIAGNOSIS — D631 Anemia in chronic kidney disease: Secondary | ICD-10-CM | POA: Diagnosis not present

## 2020-04-27 DIAGNOSIS — E8779 Other fluid overload: Secondary | ICD-10-CM | POA: Diagnosis not present

## 2020-04-27 DIAGNOSIS — N2581 Secondary hyperparathyroidism of renal origin: Secondary | ICD-10-CM | POA: Diagnosis not present

## 2020-04-27 DIAGNOSIS — E213 Hyperparathyroidism, unspecified: Secondary | ICD-10-CM | POA: Diagnosis not present

## 2020-04-27 DIAGNOSIS — N186 End stage renal disease: Secondary | ICD-10-CM | POA: Diagnosis not present

## 2020-04-27 DIAGNOSIS — D509 Iron deficiency anemia, unspecified: Secondary | ICD-10-CM | POA: Diagnosis not present

## 2020-04-29 DIAGNOSIS — D509 Iron deficiency anemia, unspecified: Secondary | ICD-10-CM | POA: Diagnosis not present

## 2020-04-29 DIAGNOSIS — N186 End stage renal disease: Secondary | ICD-10-CM | POA: Diagnosis not present

## 2020-04-29 DIAGNOSIS — E213 Hyperparathyroidism, unspecified: Secondary | ICD-10-CM | POA: Diagnosis not present

## 2020-04-29 DIAGNOSIS — E8779 Other fluid overload: Secondary | ICD-10-CM | POA: Diagnosis not present

## 2020-04-29 DIAGNOSIS — N2581 Secondary hyperparathyroidism of renal origin: Secondary | ICD-10-CM | POA: Diagnosis not present

## 2020-04-29 DIAGNOSIS — D631 Anemia in chronic kidney disease: Secondary | ICD-10-CM | POA: Diagnosis not present

## 2020-05-01 ENCOUNTER — Other Ambulatory Visit: Payer: Self-pay | Admitting: Family Medicine

## 2020-05-02 DIAGNOSIS — D509 Iron deficiency anemia, unspecified: Secondary | ICD-10-CM | POA: Diagnosis not present

## 2020-05-02 DIAGNOSIS — N2581 Secondary hyperparathyroidism of renal origin: Secondary | ICD-10-CM | POA: Diagnosis not present

## 2020-05-02 DIAGNOSIS — E8779 Other fluid overload: Secondary | ICD-10-CM | POA: Diagnosis not present

## 2020-05-02 DIAGNOSIS — N186 End stage renal disease: Secondary | ICD-10-CM | POA: Diagnosis not present

## 2020-05-02 DIAGNOSIS — G4733 Obstructive sleep apnea (adult) (pediatric): Secondary | ICD-10-CM | POA: Diagnosis not present

## 2020-05-02 DIAGNOSIS — D631 Anemia in chronic kidney disease: Secondary | ICD-10-CM | POA: Diagnosis not present

## 2020-05-02 DIAGNOSIS — E213 Hyperparathyroidism, unspecified: Secondary | ICD-10-CM | POA: Diagnosis not present

## 2020-05-04 DIAGNOSIS — D631 Anemia in chronic kidney disease: Secondary | ICD-10-CM | POA: Diagnosis not present

## 2020-05-04 DIAGNOSIS — N2581 Secondary hyperparathyroidism of renal origin: Secondary | ICD-10-CM | POA: Diagnosis not present

## 2020-05-04 DIAGNOSIS — N186 End stage renal disease: Secondary | ICD-10-CM | POA: Diagnosis not present

## 2020-05-04 DIAGNOSIS — D509 Iron deficiency anemia, unspecified: Secondary | ICD-10-CM | POA: Diagnosis not present

## 2020-05-04 DIAGNOSIS — E213 Hyperparathyroidism, unspecified: Secondary | ICD-10-CM | POA: Diagnosis not present

## 2020-05-04 DIAGNOSIS — E8779 Other fluid overload: Secondary | ICD-10-CM | POA: Diagnosis not present

## 2020-05-04 DIAGNOSIS — Z992 Dependence on renal dialysis: Secondary | ICD-10-CM | POA: Diagnosis not present

## 2020-05-06 DIAGNOSIS — D509 Iron deficiency anemia, unspecified: Secondary | ICD-10-CM | POA: Diagnosis not present

## 2020-05-06 DIAGNOSIS — E8779 Other fluid overload: Secondary | ICD-10-CM | POA: Diagnosis not present

## 2020-05-06 DIAGNOSIS — N2581 Secondary hyperparathyroidism of renal origin: Secondary | ICD-10-CM | POA: Diagnosis not present

## 2020-05-06 DIAGNOSIS — N186 End stage renal disease: Secondary | ICD-10-CM | POA: Diagnosis not present

## 2020-05-06 DIAGNOSIS — E213 Hyperparathyroidism, unspecified: Secondary | ICD-10-CM | POA: Diagnosis not present

## 2020-05-09 ENCOUNTER — Other Ambulatory Visit: Payer: Self-pay | Admitting: Family Medicine

## 2020-05-09 DIAGNOSIS — E213 Hyperparathyroidism, unspecified: Secondary | ICD-10-CM | POA: Diagnosis not present

## 2020-05-09 DIAGNOSIS — N186 End stage renal disease: Secondary | ICD-10-CM | POA: Diagnosis not present

## 2020-05-09 DIAGNOSIS — E8779 Other fluid overload: Secondary | ICD-10-CM | POA: Diagnosis not present

## 2020-05-09 DIAGNOSIS — N2581 Secondary hyperparathyroidism of renal origin: Secondary | ICD-10-CM | POA: Diagnosis not present

## 2020-05-09 DIAGNOSIS — D509 Iron deficiency anemia, unspecified: Secondary | ICD-10-CM | POA: Diagnosis not present

## 2020-05-10 ENCOUNTER — Other Ambulatory Visit: Payer: Self-pay | Admitting: Family Medicine

## 2020-05-10 ENCOUNTER — Ambulatory Visit (INDEPENDENT_AMBULATORY_CARE_PROVIDER_SITE_OTHER): Payer: Medicare Other | Admitting: Family Medicine

## 2020-05-10 ENCOUNTER — Other Ambulatory Visit: Payer: Self-pay

## 2020-05-10 ENCOUNTER — Encounter: Payer: Self-pay | Admitting: Family Medicine

## 2020-05-10 VITALS — BP 143/83 | HR 94 | Ht 69.0 in | Wt 203.0 lb

## 2020-05-10 DIAGNOSIS — IMO0002 Reserved for concepts with insufficient information to code with codable children: Secondary | ICD-10-CM

## 2020-05-10 DIAGNOSIS — Z992 Dependence on renal dialysis: Secondary | ICD-10-CM

## 2020-05-10 DIAGNOSIS — E103551 Type 1 diabetes mellitus with stable proliferative diabetic retinopathy, right eye: Secondary | ICD-10-CM | POA: Diagnosis not present

## 2020-05-10 DIAGNOSIS — E1021 Type 1 diabetes mellitus with diabetic nephropathy: Secondary | ICD-10-CM | POA: Diagnosis not present

## 2020-05-10 DIAGNOSIS — E1065 Type 1 diabetes mellitus with hyperglycemia: Secondary | ICD-10-CM | POA: Diagnosis not present

## 2020-05-10 DIAGNOSIS — E103552 Type 1 diabetes mellitus with stable proliferative diabetic retinopathy, left eye: Secondary | ICD-10-CM | POA: Diagnosis not present

## 2020-05-10 DIAGNOSIS — E1022 Type 1 diabetes mellitus with diabetic chronic kidney disease: Secondary | ICD-10-CM

## 2020-05-10 DIAGNOSIS — N1832 Chronic kidney disease, stage 3b: Secondary | ICD-10-CM | POA: Diagnosis not present

## 2020-05-10 DIAGNOSIS — N400 Enlarged prostate without lower urinary tract symptoms: Secondary | ICD-10-CM

## 2020-05-10 DIAGNOSIS — N186 End stage renal disease: Secondary | ICD-10-CM

## 2020-05-10 DIAGNOSIS — E039 Hypothyroidism, unspecified: Secondary | ICD-10-CM

## 2020-05-10 DIAGNOSIS — K8689 Other specified diseases of pancreas: Secondary | ICD-10-CM | POA: Diagnosis not present

## 2020-05-10 DIAGNOSIS — N183 Chronic kidney disease, stage 3 unspecified: Secondary | ICD-10-CM

## 2020-05-10 MED ORDER — AGAMATRIX ULTRA-THIN LANCETS MISC: 3 refills | Status: DC

## 2020-05-10 NOTE — Telephone Encounter (Signed)
LMOVM - [facility] message said product is on backorder/unavailable, cannot be found with [facility] wholesaler What lancet would you like [place] to change this to

## 2020-05-10 NOTE — Progress Notes (Signed)
BP (!) 143/83   Pulse 94   Ht $R'5\' 9"'Si$  (1.753 m)   Wt 203 lb (92.1 kg)   SpO2 96%   BMI 29.98 kg/m    Subjective:   Patient ID: Patrick Brown, male    DOB: 1970/04/06, 50 y.o.   MRN: [id number]  HPI: Patrick Brown is a 50 y.o. male presenting on 05/10/2020 for Medical Management of Chronic Issues and Diabetes   HPI Type 1 diabetes mellitus Patient comes in today for recheck of his diabetes. Patient has been currently taking insulin pump, managed by endocrinology. Patient is currently on an ACE inhibitor/ARB, is end-stage renal disease. Patient has not seen an ophthalmologist this year. Patient denies any new issues with their feet. The symptom started onset as an adult retinopathy and end-stage renal disease on dialysis ARE RELATED TO DM   Hypothyroidism recheck Patient is coming in for thyroid recheck today as well. They deny any issues with hair changes or heat or cold problems or diarrhea or constipation. They deny any chest pain or palpitations. They are currently on levothyroxine 175 micrograms   Hyperlipidemia Patient is coming in for recheck of his hyperlipidemia. The patient is currently taking Crestor and Vascepa. They deny any issues with myalgias or history of liver damage from it. They deny any focal numbness or weakness or chest pain.   Patient is on the renal transplant list and is looking to get on the pancreatic transplant list  Relevant past medical, surgical, family and social history reviewed and updated as indicated. Interim medical history since our last visit reviewed. Allergies and medications reviewed and updated.  Review of Systems  Constitutional: Negative for chills and fever.  Eyes: Negative for visual disturbance.  Respiratory: Negative for shortness of breath and wheezing.   Cardiovascular: Negative for chest pain and leg swelling.  Musculoskeletal: Negative for back pain and gait problem.  Skin: Negative for rash.  Neurological: Negative for dizziness,  weakness and numbness.  All other systems reviewed and are negative.   Per HPI unless specifically indicated above   Allergies as of 05/10/2020      Reactions   Sulfa Antibiotics Other (See Comments)   High potassium   Ramipril Cough   Versed [midazolam] Other (See Comments)   "I don't wake up very good or clear it out of my system"      Medication List       Accurate as of May 10, 2020  8:50 AM. If you have any questions, ask your nurse or doctor.        acetaminophen 500 MG tablet Commonly known as: TYLENOL Take 1,000 mg by mouth 2 (two) times daily.   acyclovir 400 MG tablet Commonly known as: ZOVIRAX TAKE 1 TABLET (400 MG TOTAL) BY MOUTH 2 (TWO) TIMES DAILY.   AgaMatrix Ultra-Thin Lancets Misc TEST BS 4 TIMES A DAY AND AS NEEDED DX E10.65   albuterol 108 (90 Base) MCG/ACT inhaler Commonly known as: VENTOLIN HFA Inhale 2 puffs into the lungs every 6 (six) hours as needed for wheezing or shortness of breath.   aspirin 81 MG chewable tablet Chew 81 mg by mouth every morning.   benzonatate 100 MG capsule Commonly known as: TESSALON Take 1 capsule (100 mg total) by mouth 2 (two) times daily as needed for cough.   cetirizine 10 MG tablet Commonly known as: ZYRTEC Take 1 tablet (10 mg total) by mouth daily.   cyclobenzaprine 10 MG tablet Commonly known as: FLEXERIL TAKE 1 TABLET  BY MOUTH THREE TIMES A DAY AS NEEDED FOR MUSCLE SPASMS   diclofenac sodium 1 % Gel Commonly known as: VOLTAREN Apply 2 g topically 4 (four) times daily.   diphenoxylate-atropine 2.5-0.025 MG tablet Commonly known as: Lomotil Take 1 tablet by mouth 4 (four) times daily as needed for diarrhea or loose stools.   Dovato 50-300 MG Tabs Generic drug: Dolutegravir-lamiVUDine Take 1 tablet by mouth daily.   DULoxetine 30 MG capsule Commonly known as: Cymbalta Take 1 capsule (30 mg total) by mouth daily. Take with the 25m for total of $Remove'90mg'[id number]$    DULoxetine 60 MG capsule Commonly known as:  CYMBALTA Take 1 capsule (60 mg total) by mouth daily. Take with 30 mg fot a total of $Remove'90mg'ipzLMGv$    esomeprazole 40 MG capsule Commonly known as: NEXIUM TAKE 1 CAPSULE BY MOUTH EVERY DAY   febuxostat 40 MG tablet Commonly known as: ULORIC TAKE 1 TABLET BY MOUTH EVERY DAY   ferrous sulfate 325 (65 FE) MG tablet Take 650 mg by mouth daily with breakfast.   fluticasone 50 MCG/ACT nasal spray Commonly known as: FLONASE SPRAY 2 SPRAYS INTO EACH NOSTRIL EVERY DAY   furosemide 40 MG tablet Commonly known as: LASIX Take 40 mg by mouth daily. 40 mg nightly   GLUCOSAMINE 1500 COMPLEX PO Take 1 tablet by mouth 2 (two) times daily.   glucosamine-chondroitin 500-400 MG tablet Take by mouth.   glucose blood test strip Commonly known as: OneTouch Verio TEST BLOOD SUGAR 4 TIMES DAILY AND AS NEEDED   hydrOXYzine 25 MG tablet Commonly known as: ATARAX/VISTARIL TAKE 1 TABLET BY MOUTH EVERY 8 HOURS AS NEEDED.   hyoscyamine 0.125 MG tablet Commonly known as: LEVSIN TAKE 1 TABLET (0.125 MG TOTAL) BY MOUTH EVERY 4 (FOUR) HOURS AS NEEDED.   insulin glargine 100 UNIT/ML injection Commonly known as: LANTUS In the event of insulin pump failure, inject 8 units twice daily   insulin lispro 100 UNIT/ML injection Commonly known as: HumaLOG USE 42 UNITS TO 120 UNITS PER PUMP DAILY AS DIRECTED   ipratropium 0.03 % nasal spray Commonly known as: ATROVENT USE 2 SPRAYS IN EACH NOSTRIL 2-3 TIMES DAILY   levothyroxine 175 MCG tablet Commonly known as: SYNTHROID Take 1 tablet (175 mcg total) by mouth daily before breakfast.   lipase/protease/amylase 12000-38000 units Cpep capsule Commonly known as: CREON Take 2 capsules prior to meals and 1 capsule prior to snacks   losartan 50 MG tablet Commonly known as: COZAAR Take 50 mg by mouth daily. Takes when not on dialysis.   meclizine 12.5 MG tablet Commonly known as: ANTIVERT Take 1 tablet (12.5 mg total) by mouth 3 (three) times daily as needed for  dizziness.   metoCLOPramide 5 MG tablet Commonly known as: REGLAN Take 1 tablet (5 mg total) by mouth 3 (three) times daily before meals.   Misc Intestinal Flora Regulat Caps Take 1 capsule by mouth every morning.   niacin 1000 MG CR tablet Commonly known as: NIASPAN TAKE 1 TABLET (1,000 MG TOTAL) BY MOUTH AT BEDTIME.   ondansetron 4 MG tablet Commonly known as: ZOFRAN Take 1 tablet (4 mg total) by mouth every 8 (eight) hours as needed for nausea.   promethazine 25 MG suppository Commonly known as: Phenergan Place 1 suppository (25 mg total) rectally every 6 (six) hours as needed for nausea or vomiting.   promethazine-dextromethorphan 6.25-15 MG/5ML syrup Commonly known as: PROMETHAZINE-DM Take 5 mLs by mouth 4 (four) times daily as needed for cough.   rosuvastatin 20  MG tablet Commonly known as: CRESTOR Take 20 mg by mouth at bedtime.   sevelamer carbonate 800 MG tablet Commonly known as: RENVELA Take 800 mg by mouth 3 (three) times daily. 2 tabs before each meal   tamsulosin 0.4 MG Caps capsule Commonly known as: FLOMAX Take 0.4 mg by mouth daily.   Testosterone 20.25 MG/ACT (1.62%) Gel APPLY 3 PUMPS DAILY AS DIRECTED   traZODone 50 MG tablet Commonly known as: DESYREL Take by mouth.   Vascepa 1 g capsule Generic drug: icosapent Ethyl TAKE 2 CAPSULES BY MOUTH TWICE A DAY        Objective:   BP (!) 143/83   Pulse 94   Ht $R'5\' 9"'SK$  (1.753 m)   Wt 203 lb (92.1 kg)   SpO2 96%   BMI 29.98 kg/m   Wt Readings from Last 3 Encounters:  05/10/20 203 lb (92.1 kg)  03/08/20 192 lb (87.1 kg)  02/09/20 203 lb (92.1 kg)    Physical Exam Vitals and nursing note reviewed.  Constitutional:      General: He is not in acute distress.    Appearance: He is well-developed. He is not diaphoretic.  Eyes:     General: No scleral icterus.    Conjunctiva/sclera: Conjunctivae normal.  Neck:     Thyroid: No thyromegaly.  Cardiovascular:     Rate and Rhythm: Normal rate  and regular rhythm.     Heart sounds: Normal heart sounds. No murmur heard.   Pulmonary:     Effort: Pulmonary effort is normal. No respiratory distress.     Breath sounds: Normal breath sounds. No wheezing.  Musculoskeletal:        General: No swelling. Normal range of motion.     Cervical back: Neck supple.  Lymphadenopathy:     Cervical: No cervical adenopathy.  Skin:    General: Skin is warm and dry.     Findings: No rash.  Neurological:     Mental Status: He is alert and oriented to person, place, and time.     Coordination: Coordination normal.  Psychiatric:        Behavior: Behavior normal.       Assessment & Plan:   Problem List Items Addressed This Visit      Endocrine   Type 1 diabetes mellitus ([place]) - Primary   Relevant Medications   AgaMatrix Ultra-Thin Lancets MISC   Other Relevant Orders   Lipid panel   CMP14+EGFR   Stable treated proliferative diabetic retinopathy of right eye determined by examination associated with type 1 diabetes mellitus ([place])   Stable treated proliferative diabetic retinopathy of left eye without macular edema determined by examination associated with type 1 diabetes mellitus ([place])   Acquired hypothyroidism   Relevant Orders   TSH     Genitourinary   Benign prostatic hyperplasia without lower urinary tract symptoms   Relevant Orders   PSA, total and free   ESRD (end stage renal disease) on dialysis ([place])    Other Visit Diagnoses    Uncontrolled type 1 diabetes mellitus with stage 3 chronic kidney disease (HCC)       Relevant Medications   AgaMatrix Ultra-Thin Lancets MISC   Other Relevant Orders   Lipid panel   CMP14+EGFR      Continue current medication, seems to be doing well, will check blood work today.  He is going to lose a little bit of weight so he can get on the pancreatic transplant list Follow up plan: Return in  about 3 months (around 08/09/2020), or if symptoms worsen or fail to improve, for Thyroid and  cholesterol diabetes recheck.  Counseling provided for all of the vaccine components Orders Placed This Encounter  Procedures  . Lipid panel  . CMP14+EGFR  . TSH  . PSA, total and free    Caryl Pina, MD [facility] 05/10/2020, 8:50 AM

## 2020-05-11 DIAGNOSIS — N186 End stage renal disease: Secondary | ICD-10-CM | POA: Diagnosis not present

## 2020-05-11 DIAGNOSIS — D509 Iron deficiency anemia, unspecified: Secondary | ICD-10-CM | POA: Diagnosis not present

## 2020-05-11 DIAGNOSIS — N2581 Secondary hyperparathyroidism of renal origin: Secondary | ICD-10-CM | POA: Diagnosis not present

## 2020-05-11 DIAGNOSIS — E8779 Other fluid overload: Secondary | ICD-10-CM | POA: Diagnosis not present

## 2020-05-11 DIAGNOSIS — E213 Hyperparathyroidism, unspecified: Secondary | ICD-10-CM | POA: Diagnosis not present

## 2020-05-11 LAB — CMP14+EGFR
ALT: 27 IU/L (ref 0–44)
AST: 29 IU/L (ref 0–40)
Albumin/Globulin Ratio: 1.4 (ref 1.2–2.2)
Albumin: 4.2 g/dL (ref 3.8–4.9)
Alkaline Phosphatase: 197 IU/L — ABNORMAL HIGH (ref 44–121)
BUN/Creatinine Ratio: 6 — ABNORMAL LOW (ref 9–20)
BUN: 42 mg/dL — ABNORMAL HIGH (ref 6–24)
Bilirubin Total: 0.3 mg/dL (ref 0.0–1.2)
CO2: 21 mmol/L (ref 20–29)
Calcium: 9.4 mg/dL (ref 8.7–10.2)
Chloride: 88 mmol/L — ABNORMAL LOW (ref 96–106)
Creatinine, Ser: 6.85 mg/dL (ref 0.76–1.27)
Globulin, Total: 2.9 g/dL (ref 1.5–4.5)
Glucose: 291 mg/dL — ABNORMAL HIGH (ref 65–99)
Potassium: 5.9 mmol/L (ref 3.5–5.2)
Sodium: 133 mmol/L — ABNORMAL LOW (ref 134–144)
Total Protein: 7.1 g/dL (ref 6.0–8.5)
eGFR: 9 mL/min/{1.73_m2} — ABNORMAL LOW (ref >59)

## 2020-05-11 LAB — LIPID PANEL
Chol/HDL Ratio: 2.3 ratio (ref 0.0–5.0)
Cholesterol, Total: 95 mg/dL — ABNORMAL LOW (ref 100–199)
HDL: 42 mg/dL (ref >39)
LDL Chol Calc (NIH): 33 mg/dL (ref 0–99)
Triglycerides: 107 mg/dL (ref 0–149)
VLDL Cholesterol Cal: 20 mg/dL (ref 5–40)

## 2020-05-11 LAB — PSA, TOTAL AND FREE
PSA, Free Pct: 55 %
PSA, Free: 0.11 ng/mL
Prostate Specific Ag, Serum: 0.2 ng/mL (ref 0.0–4.0)

## 2020-05-11 LAB — TSH: TSH: 0.656 u[IU]/mL (ref 0.450–4.500)

## 2020-05-12 DIAGNOSIS — D510 Vitamin B12 deficiency anemia due to intrinsic factor deficiency: Secondary | ICD-10-CM | POA: Diagnosis not present

## 2020-05-13 DIAGNOSIS — D509 Iron deficiency anemia, unspecified: Secondary | ICD-10-CM | POA: Diagnosis not present

## 2020-05-13 DIAGNOSIS — N186 End stage renal disease: Secondary | ICD-10-CM | POA: Diagnosis not present

## 2020-05-13 DIAGNOSIS — N2581 Secondary hyperparathyroidism of renal origin: Secondary | ICD-10-CM | POA: Diagnosis not present

## 2020-05-13 DIAGNOSIS — E213 Hyperparathyroidism, unspecified: Secondary | ICD-10-CM | POA: Diagnosis not present

## 2020-05-13 DIAGNOSIS — E8779 Other fluid overload: Secondary | ICD-10-CM | POA: Diagnosis not present

## 2020-05-14 ENCOUNTER — Encounter: Payer: Self-pay | Admitting: Family Medicine

## 2020-05-15 NOTE — Telephone Encounter (Signed)
Dr. Warrick Parisian,  Just making sure it is alright to schedule him during regular hours and does not need to be in [facility]?

## 2020-05-15 NOTE — Telephone Encounter (Signed)
Pt was able to get AgaMatrix lancets, but pt does have Accu-Chek Guide lancets when needed & if this occurs again we can order those

## 2020-05-16 DIAGNOSIS — N186 End stage renal disease: Secondary | ICD-10-CM | POA: Diagnosis not present

## 2020-05-16 DIAGNOSIS — D509 Iron deficiency anemia, unspecified: Secondary | ICD-10-CM | POA: Diagnosis not present

## 2020-05-16 DIAGNOSIS — E8779 Other fluid overload: Secondary | ICD-10-CM | POA: Diagnosis not present

## 2020-05-16 DIAGNOSIS — N2581 Secondary hyperparathyroidism of renal origin: Secondary | ICD-10-CM | POA: Diagnosis not present

## 2020-05-16 DIAGNOSIS — E213 Hyperparathyroidism, unspecified: Secondary | ICD-10-CM | POA: Diagnosis not present

## 2020-05-17 ENCOUNTER — Ambulatory Visit (INDEPENDENT_AMBULATORY_CARE_PROVIDER_SITE_OTHER): Payer: Medicare Other

## 2020-05-17 ENCOUNTER — Ambulatory Visit (INDEPENDENT_AMBULATORY_CARE_PROVIDER_SITE_OTHER): Payer: Medicare Other | Admitting: Family Medicine

## 2020-05-17 ENCOUNTER — Other Ambulatory Visit: Payer: Self-pay

## 2020-05-17 ENCOUNTER — Encounter: Payer: Self-pay | Admitting: Family Medicine

## 2020-05-17 VITALS — BP 118/71 | HR 94 | Ht 69.0 in | Wt 203.0 lb

## 2020-05-17 DIAGNOSIS — R053 Chronic cough: Secondary | ICD-10-CM

## 2020-05-17 DIAGNOSIS — R059 Cough, unspecified: Secondary | ICD-10-CM | POA: Diagnosis not present

## 2020-05-17 MED ORDER — LEVOCETIRIZINE DIHYDROCHLORIDE 5 MG PO TABS: 5.0000 mg | ORAL_TABLET | Freq: Every evening | ORAL | 3 refills | Status: DC

## 2020-05-17 NOTE — Progress Notes (Signed)
BP 118/71   Pulse 94   Ht 5\' 9"  (1.753 m)   Wt 203 lb (92.1 kg)   SpO2 100%   BMI 29.98 kg/m    Subjective:   Patient ID: Patrick Brown, male    DOB: Aug 13, 1970, 50 y.o.   MRN: [id number]  HPI: Patrick Brown is a 50 y.o. male presenting on 05/17/2020 for Cough (Persistent for weeks)   HPI Patient has been having persistent cough and with a history he was concerned about possible infection.  He says the cough is dry and denies any fevers or chills.  He says is nonproductive.  He does take Zyrtec and allergy medicines and pollen has been in the air, so he knows that is a possibility.  He denies any shortness of breath or wheezing.  He denies any changes in medicine recently.  Relevant past medical, surgical, family and social history reviewed and updated as indicated. Interim medical history since our last visit reviewed. Allergies and medications reviewed and updated.  Review of Systems  Constitutional: Negative for chills and fever.  Respiratory: Positive for cough. Negative for shortness of breath and wheezing.   Cardiovascular: Negative for chest pain and leg swelling.  Musculoskeletal: Negative for back pain and gait problem.  Skin: Negative for rash.  All other systems reviewed and are negative.   Per HPI unless specifically indicated above      Objective:   BP 118/71   Pulse 94   Ht 5\' 9"  (1.753 m)   Wt 203 lb (92.1 kg)   SpO2 100%   BMI 29.98 kg/m   Wt Readings from Last 3 Encounters:  05/17/20 203 lb (92.1 kg)  05/10/20 203 lb (92.1 kg)  03/08/20 192 lb (87.1 kg)    Physical Exam Vitals and nursing note reviewed.  Constitutional:      General: He is not in acute distress.    Appearance: He is well-developed. He is not diaphoretic.  HENT:     Right Ear: Tympanic membrane normal.     Left Ear: Tympanic membrane normal.     Nose: Nose normal.     Mouth/Throat:     Mouth: Mucous membranes are moist.     Pharynx: No oropharyngeal exudate or posterior  oropharyngeal erythema.  Eyes:     General: No scleral icterus.    Conjunctiva/sclera: Conjunctivae normal.  Neck:     Thyroid: No thyromegaly.  Cardiovascular:     Rate and Rhythm: Normal rate and regular rhythm.     Heart sounds: Normal heart sounds. No murmur heard.   Pulmonary:     Effort: Pulmonary effort is normal. No respiratory distress.     Breath sounds: Normal breath sounds. No wheezing.  Musculoskeletal:     Cervical back: Neck supple.  Lymphadenopathy:     Cervical: No cervical adenopathy.  Skin:    General: Skin is warm and dry.     Findings: No rash.  Neurological:     Mental Status: He is alert and oriented to person, place, and time.     Coordination: Coordination normal.  Psychiatric:        Behavior: Behavior normal.     Chest x-ray: No acute cardiopulmonary abnormality, await final read from radiology.  Assessment & Plan:   Problem List Items Addressed This Visit   None   Visit Diagnoses    Persistent cough    -  Primary   Relevant Medications   levocetirizine (XYZAL) 5 MG tablet   Other Relevant  Orders   DG Chest 2 View      I sent Xyzal for the patient, likely allergies, likely the Zyrtec is not controlling anymore, will switch antihistamines and see if he does better with that.  There is a lot of pollen currently in this time of season so that may be able to fix things for him. Follow up plan: Return if symptoms worsen or fail to improve.  Counseling provided for all of the vaccine components Orders Placed This Encounter  Procedures  . DG Chest 2 View    Caryl Pina, MD [facility] 05/17/2020, 11:20 AM

## 2020-05-18 DIAGNOSIS — E8779 Other fluid overload: Secondary | ICD-10-CM | POA: Diagnosis not present

## 2020-05-18 DIAGNOSIS — D509 Iron deficiency anemia, unspecified: Secondary | ICD-10-CM | POA: Diagnosis not present

## 2020-05-18 DIAGNOSIS — E213 Hyperparathyroidism, unspecified: Secondary | ICD-10-CM | POA: Diagnosis not present

## 2020-05-18 DIAGNOSIS — N2581 Secondary hyperparathyroidism of renal origin: Secondary | ICD-10-CM | POA: Diagnosis not present

## 2020-05-18 DIAGNOSIS — N186 End stage renal disease: Secondary | ICD-10-CM | POA: Diagnosis not present

## 2020-05-20 DIAGNOSIS — N2581 Secondary hyperparathyroidism of renal origin: Secondary | ICD-10-CM | POA: Diagnosis not present

## 2020-05-20 DIAGNOSIS — N186 End stage renal disease: Secondary | ICD-10-CM | POA: Diagnosis not present

## 2020-05-20 DIAGNOSIS — E8779 Other fluid overload: Secondary | ICD-10-CM | POA: Diagnosis not present

## 2020-05-20 DIAGNOSIS — D509 Iron deficiency anemia, unspecified: Secondary | ICD-10-CM | POA: Diagnosis not present

## 2020-05-20 DIAGNOSIS — E213 Hyperparathyroidism, unspecified: Secondary | ICD-10-CM | POA: Diagnosis not present

## 2020-05-23 DIAGNOSIS — N2581 Secondary hyperparathyroidism of renal origin: Secondary | ICD-10-CM | POA: Diagnosis not present

## 2020-05-23 DIAGNOSIS — N186 End stage renal disease: Secondary | ICD-10-CM | POA: Diagnosis not present

## 2020-05-23 DIAGNOSIS — E213 Hyperparathyroidism, unspecified: Secondary | ICD-10-CM | POA: Diagnosis not present

## 2020-05-23 DIAGNOSIS — E8779 Other fluid overload: Secondary | ICD-10-CM | POA: Diagnosis not present

## 2020-05-23 DIAGNOSIS — D509 Iron deficiency anemia, unspecified: Secondary | ICD-10-CM | POA: Diagnosis not present

## 2020-05-24 ENCOUNTER — Ambulatory Visit: Payer: Medicare Other | Admitting: Primary Care

## 2020-05-25 DIAGNOSIS — N186 End stage renal disease: Secondary | ICD-10-CM | POA: Diagnosis not present

## 2020-05-25 DIAGNOSIS — E213 Hyperparathyroidism, unspecified: Secondary | ICD-10-CM | POA: Diagnosis not present

## 2020-05-25 DIAGNOSIS — N2581 Secondary hyperparathyroidism of renal origin: Secondary | ICD-10-CM | POA: Diagnosis not present

## 2020-05-25 DIAGNOSIS — E8779 Other fluid overload: Secondary | ICD-10-CM | POA: Diagnosis not present

## 2020-05-25 DIAGNOSIS — D509 Iron deficiency anemia, unspecified: Secondary | ICD-10-CM | POA: Diagnosis not present

## 2020-05-27 DIAGNOSIS — E213 Hyperparathyroidism, unspecified: Secondary | ICD-10-CM | POA: Diagnosis not present

## 2020-05-27 DIAGNOSIS — N186 End stage renal disease: Secondary | ICD-10-CM | POA: Diagnosis not present

## 2020-05-27 DIAGNOSIS — E8779 Other fluid overload: Secondary | ICD-10-CM | POA: Diagnosis not present

## 2020-05-27 DIAGNOSIS — N2581 Secondary hyperparathyroidism of renal origin: Secondary | ICD-10-CM | POA: Diagnosis not present

## 2020-05-27 DIAGNOSIS — D509 Iron deficiency anemia, unspecified: Secondary | ICD-10-CM | POA: Diagnosis not present

## 2020-05-29 ENCOUNTER — Other Ambulatory Visit: Payer: Self-pay | Admitting: Family Medicine

## 2020-05-29 DIAGNOSIS — E349 Endocrine disorder, unspecified: Secondary | ICD-10-CM

## 2020-05-29 NOTE — Telephone Encounter (Signed)
Last office visit 05/10/20 Last refill 03/03/20, #[id number], 0 refills

## 2020-05-30 ENCOUNTER — Encounter: Payer: Self-pay | Admitting: Family Medicine

## 2020-05-30 DIAGNOSIS — N186 End stage renal disease: Secondary | ICD-10-CM | POA: Diagnosis not present

## 2020-05-30 DIAGNOSIS — E8779 Other fluid overload: Secondary | ICD-10-CM | POA: Diagnosis not present

## 2020-05-30 DIAGNOSIS — D509 Iron deficiency anemia, unspecified: Secondary | ICD-10-CM | POA: Diagnosis not present

## 2020-05-30 DIAGNOSIS — E213 Hyperparathyroidism, unspecified: Secondary | ICD-10-CM | POA: Diagnosis not present

## 2020-05-30 DIAGNOSIS — N2581 Secondary hyperparathyroidism of renal origin: Secondary | ICD-10-CM | POA: Diagnosis not present

## 2020-05-30 DIAGNOSIS — J40 Bronchitis, not specified as acute or chronic: Secondary | ICD-10-CM

## 2020-05-31 MED ORDER — BENZONATATE 100 MG PO CAPS: 100.0000 mg | ORAL_CAPSULE | Freq: Two times a day (BID) | ORAL | 1 refills | Status: DC | PRN

## 2020-06-01 DIAGNOSIS — D509 Iron deficiency anemia, unspecified: Secondary | ICD-10-CM | POA: Diagnosis not present

## 2020-06-01 DIAGNOSIS — E213 Hyperparathyroidism, unspecified: Secondary | ICD-10-CM | POA: Diagnosis not present

## 2020-06-01 DIAGNOSIS — N2581 Secondary hyperparathyroidism of renal origin: Secondary | ICD-10-CM | POA: Diagnosis not present

## 2020-06-01 DIAGNOSIS — N186 End stage renal disease: Secondary | ICD-10-CM | POA: Diagnosis not present

## 2020-06-01 DIAGNOSIS — E8779 Other fluid overload: Secondary | ICD-10-CM | POA: Diagnosis not present

## 2020-06-02 DIAGNOSIS — Z961 Presence of intraocular lens: Secondary | ICD-10-CM | POA: Diagnosis not present

## 2020-06-02 DIAGNOSIS — H179 Unspecified corneal scar and opacity: Secondary | ICD-10-CM | POA: Diagnosis not present

## 2020-06-02 DIAGNOSIS — H04121 Dry eye syndrome of right lacrimal gland: Secondary | ICD-10-CM | POA: Diagnosis not present

## 2020-06-03 DIAGNOSIS — R059 Cough, unspecified: Secondary | ICD-10-CM | POA: Diagnosis not present

## 2020-06-03 DIAGNOSIS — E119 Type 2 diabetes mellitus without complications: Secondary | ICD-10-CM | POA: Diagnosis not present

## 2020-06-03 DIAGNOSIS — E8779 Other fluid overload: Secondary | ICD-10-CM | POA: Diagnosis not present

## 2020-06-03 DIAGNOSIS — N186 End stage renal disease: Secondary | ICD-10-CM | POA: Diagnosis not present

## 2020-06-03 DIAGNOSIS — N2581 Secondary hyperparathyroidism of renal origin: Secondary | ICD-10-CM | POA: Diagnosis not present

## 2020-06-03 DIAGNOSIS — E213 Hyperparathyroidism, unspecified: Secondary | ICD-10-CM | POA: Diagnosis not present

## 2020-06-03 DIAGNOSIS — D509 Iron deficiency anemia, unspecified: Secondary | ICD-10-CM | POA: Diagnosis not present

## 2020-06-03 DIAGNOSIS — Z992 Dependence on renal dialysis: Secondary | ICD-10-CM | POA: Diagnosis not present

## 2020-06-03 DIAGNOSIS — E039 Hypothyroidism, unspecified: Secondary | ICD-10-CM | POA: Diagnosis not present

## 2020-06-05 ENCOUNTER — Ambulatory Visit: Payer: Medicare Other | Admitting: Pulmonary Disease

## 2020-06-05 ENCOUNTER — Encounter: Payer: Self-pay | Admitting: Pulmonary Disease

## 2020-06-05 ENCOUNTER — Other Ambulatory Visit: Payer: Self-pay

## 2020-06-05 VITALS — BP 142/66 | HR 86 | Temp 98.0°F | Ht 70.0 in | Wt 209.4 lb

## 2020-06-05 DIAGNOSIS — H04121 Dry eye syndrome of right lacrimal gland: Secondary | ICD-10-CM | POA: Insufficient documentation

## 2020-06-05 DIAGNOSIS — G4733 Obstructive sleep apnea (adult) (pediatric): Secondary | ICD-10-CM

## 2020-06-05 DIAGNOSIS — Z9989 Dependence on other enabling machines and devices: Secondary | ICD-10-CM

## 2020-06-05 DIAGNOSIS — R053 Chronic cough: Secondary | ICD-10-CM | POA: Diagnosis not present

## 2020-06-05 MED ORDER — ALBUTEROL SULFATE HFA 108 (90 BASE) MCG/ACT IN AERS: 2.0000 | INHALATION_SPRAY | Freq: Four times a day (QID) | RESPIRATORY_TRACT | 5 refills | Status: DC | PRN

## 2020-06-05 NOTE — Patient Instructions (Signed)
Can try using albuterol every 6 hours as needed for cough, wheeze, or chest congestion  Follow up in 1 year

## 2020-06-05 NOTE — Progress Notes (Signed)
[facility]  Chief Complaint  Patient presents with  . Follow-up    Non productive cough since beginning of April 2022    Constitutional:  BP (!) 142/66 (BP Location: Right Arm, Cuff Size: Normal)   Pulse 86   Temp 98 F (36.7 C) (Temporal)   Ht 5\' 10"  (1.778 m)   Wt 209 lb 6.4 oz (95 kg)   SpO2 95% Comment: Room air  BMI 30.05 kg/m   Past Medical History:  Anemia, Pneumonia, CKD, DJD, Depression, HLD, GERD, Gastroparesis, HIV, Hypothyroidism, DM, Retinopathy  Past Surgical History:  He  has a past surgical history that includes Vitrectomy (bilateral); insulin pump; Colonoscopy; Esophagogastroduodenoscopy; LASIK (Bilateral); Cataract extraction (Right); Video bronchoscopy (Bilateral, 12/15/2012); Eye surgery ([id number]); and Hip Arthroplasty (Right, 05/10/2016).  Brief Summary:  Patrick Brown is a 50 y.o. male with obstructive sleep apnea.      Subjective:   He is here with his mother.  He was seen by PCP earlier this month for cough.  He was tried on xyzal.  CXR from 05/18/20 was normal.  He went to urgent care and had another chest xray which he reports was normal.  They treated him with a steroid shot and cough medicine.  He is doing better.  Uses CPAP nightly.  No issues with mask fit.  Physical Exam:   Appearance - well kempt   ENMT - no sinus tenderness, no oral exudate, no LAN, Mallampati 3 airway, no stridor  Respiratory - equal breath sounds bilaterally, no wheezing or rales  CV - s1s2 regular rate and rhythm, no murmurs  Ext - no clubbing, no edema  Skin - no rashes  Psych - normal mood and affect   Sleep Tests:   PSG 09/26/14>> AHI 11.3, SpO2 low 86%, PLMI 51.9  Auto CPAP 04/16/20 to 05/15/20 >> used on 29 of 30 nights with average 8 hrs 30 min.  Average AHI 4.6 with median CPAP 9 and 95 th percentile CPAP 12 cm H2O  Social History:  He  reports that he has never smoked. He has never used smokeless  tobacco. He reports that he does not drink alcohol and does not use drugs.  Family History:  His family history includes Dementia in some other family members; Diabetes type I in his brother; Prostate cancer in an other family member.     Assessment/Plan:   Obstructive sleep apnea. - he is compliant with CPAP and reports benefit from therapy - he uses [employer] for his DME - continue auto CPAP 5 to 15 cm H2O  Chronic cough. - likely related to allergic rhinitis with post nasal drip, but might have a mild asthma component as well - prn albuterol - continue xyzal, flonase - can use salt water gargles and teaspoon of honey as needed  Time Spent Involved in Patient Care on Day of Examination:  32 minutes  Follow up:  Patient Instructions  Can try using albuterol every 6 hours as needed for cough, wheeze, or chest congestion  Follow up in 1 year   Medication List:   Allergies as of 06/05/2020      Reactions   Sulfa Antibiotics Other (See Comments)   High potassium   Ramipril Cough   Versed [midazolam] Other (See Comments)   "I don't wake up very good or clear it out of my system"      Medication List       Accurate as of Jun 05, 2020 11:43  AM. If you have any questions, ask your nurse or doctor.        STOP taking these medications   promethazine-dextromethorphan 6.25-15 MG/5ML syrup Commonly known as: PROMETHAZINE-DM Stopped by: Chesley Mires, MD     TAKE these medications   acetaminophen 500 MG tablet Commonly known as: TYLENOL Take 1,000 mg by mouth 2 (two) times daily.   acyclovir 400 MG tablet Commonly known as: ZOVIRAX Takes mondays, wednesdays and fridays   AgaMatrix Ultra-Thin Lancets Misc TEST BS 4 TIMES A DAY AND AS NEEDED DX E10.65   albuterol 108 (90 Base) MCG/ACT inhaler Commonly known as: VENTOLIN HFA Inhale 2 puffs into the lungs every 6 (six) hours as needed for wheezing or shortness of breath.   aspirin 81 MG chewable tablet Chew 81 mg by  mouth every morning.   benzonatate 100 MG capsule Commonly known as: TESSALON Take 1 capsule (100 mg total) by mouth 2 (two) times daily as needed for cough.   cyclobenzaprine 10 MG tablet Commonly known as: FLEXERIL TAKE 1 TABLET BY MOUTH THREE TIMES A DAY AS NEEDED FOR MUSCLE SPASMS   diclofenac sodium 1 % Gel Commonly known as: VOLTAREN Apply 2 g topically 4 (four) times daily.   diphenoxylate-atropine 2.5-0.025 MG tablet Commonly known as: Lomotil Take 1 tablet by mouth 4 (four) times daily as needed for diarrhea or loose stools.   Dovato 50-300 MG Tabs Generic drug: Dolutegravir-lamiVUDine Take 1 tablet by mouth daily.   DULoxetine 30 MG capsule Commonly known as: Cymbalta Take 1 capsule (30 mg total) by mouth daily. Take with the 21m for total of 90mg    DULoxetine 60 MG capsule Commonly known as: CYMBALTA Take 1 capsule (60 mg total) by mouth daily. Take with 30 mg fot a total of 90mg    esomeprazole 40 MG capsule Commonly known as: NEXIUM TAKE 1 CAPSULE BY MOUTH EVERY DAY   febuxostat 40 MG tablet Commonly known as: ULORIC TAKE 1 TABLET BY MOUTH EVERY DAY   ferrous sulfate 325 (65 FE) MG tablet Take 650 mg by mouth daily with breakfast.   fluticasone 50 MCG/ACT nasal spray Commonly known as: FLONASE SPRAY 2 SPRAYS INTO EACH NOSTRIL EVERY DAY   furosemide 40 MG tablet Commonly known as: LASIX Take 40 mg by mouth daily. 40 mg nightly   GLUCOSAMINE 1500 COMPLEX PO Take 1 tablet by mouth 2 (two) times daily.   glucosamine-chondroitin 500-400 MG tablet Take by mouth.   glucose blood test strip Commonly known as: OneTouch Verio TEST BLOOD SUGAR 4 TIMES DAILY AND AS NEEDED   hydrOXYzine 25 MG tablet Commonly known as: ATARAX/VISTARIL TAKE 1 TABLET BY MOUTH EVERY 8 HOURS AS NEEDED   hyoscyamine 0.125 MG tablet Commonly known as: LEVSIN TAKE 1 TABLET (0.125 MG TOTAL) BY MOUTH EVERY 4 (FOUR) HOURS AS NEEDED.   insulin glargine 100 UNIT/ML  injection Commonly known as: LANTUS In the event of insulin pump failure, inject 8 units twice daily   insulin lispro 100 UNIT/ML injection Commonly known as: HumaLOG USE 42 UNITS TO 120 UNITS PER PUMP DAILY AS DIRECTED   ipratropium 0.03 % nasal spray Commonly known as: ATROVENT USE 2 SPRAYS IN EACH NOSTRIL 2-3 TIMES DAILY   levocetirizine 5 MG tablet Commonly known as: XYZAL Take 1 tablet (5 mg total) by mouth every evening.   levothyroxine 175 MCG tablet Commonly known as: SYNTHROID Take 1 tablet (175 mcg total) by mouth daily before breakfast.   lipase/protease/amylase 12000-38000 units Cpep capsule Commonly known as:  CREON Take 2 capsules prior to meals and 1 capsule prior to snacks   losartan 50 MG tablet Commonly known as: COZAAR Take 50 mg by mouth daily. Takes when not on dialysis.   meclizine 12.5 MG tablet Commonly known as: ANTIVERT Take 1 tablet (12.5 mg total) by mouth 3 (three) times daily as needed for dizziness.   metoCLOPramide 5 MG tablet Commonly known as: REGLAN Take 1 tablet (5 mg total) by mouth 3 (three) times daily before meals.   Misc Intestinal Flora Regulat Caps Take 1 capsule by mouth every morning.   niacin 1000 MG CR tablet Commonly known as: NIASPAN TAKE 1 TABLET (1,000 MG TOTAL) BY MOUTH AT BEDTIME.   ondansetron 4 MG tablet Commonly known as: ZOFRAN Take 1 tablet (4 mg total) by mouth every 8 (eight) hours as needed for nausea.   promethazine 25 MG suppository Commonly known as: Phenergan Place 1 suppository (25 mg total) rectally every 6 (six) hours as needed for nausea or vomiting.   rosuvastatin 20 MG tablet Commonly known as: CRESTOR Take 20 mg by mouth at bedtime.   sevelamer carbonate 800 MG tablet Commonly known as: RENVELA Take 800 mg by mouth 3 (three) times daily. 2 tabs before each meal   tamsulosin 0.4 MG Caps capsule Commonly known as: FLOMAX Take 0.4 mg by mouth daily.   Testosterone 20.25 MG/ACT (1.62%)  Gel APPLY 3 PUMPS DAILY AS DIRECTED   traZODone 50 MG tablet Commonly known as: DESYREL Take by mouth.   Vascepa 1 g capsule Generic drug: icosapent Ethyl TAKE 2 CAPSULES BY MOUTH TWICE A DAY       Signature:  Chesley Mires, MD [facility] Pager - [phone] 06/05/2020, 11:43 AM

## 2020-06-06 DIAGNOSIS — D631 Anemia in chronic kidney disease: Secondary | ICD-10-CM | POA: Diagnosis not present

## 2020-06-06 DIAGNOSIS — E8779 Other fluid overload: Secondary | ICD-10-CM | POA: Diagnosis not present

## 2020-06-06 DIAGNOSIS — N2581 Secondary hyperparathyroidism of renal origin: Secondary | ICD-10-CM | POA: Diagnosis not present

## 2020-06-06 DIAGNOSIS — N186 End stage renal disease: Secondary | ICD-10-CM | POA: Diagnosis not present

## 2020-06-06 DIAGNOSIS — D509 Iron deficiency anemia, unspecified: Secondary | ICD-10-CM | POA: Diagnosis not present

## 2020-06-07 ENCOUNTER — Telehealth: Payer: Self-pay | Admitting: Pulmonary Disease

## 2020-06-07 MED ORDER — PREDNISONE 10 MG PO TABS: ORAL_TABLET | ORAL | 0 refills | Status: DC

## 2020-06-07 NOTE — Telephone Encounter (Signed)
Called spoke with patient.  Let him know Dr. Juanetta Gosling recommendations.  Patient voiced back understanding. Pharmacy verified  Orders placed  Nothing further needed at this time.

## 2020-06-07 NOTE — Telephone Encounter (Signed)
Can send script for prednisone 10 mg pill > 4 pills daily for 2 days, 3 pills daily for 2 days, 2 pills daily for 2 days, 1 pill daily for 2 days.  He should continue other therapies as discussed at visit on 06/05/20.  These therapies can take at least a couple of weeks to show improvement.

## 2020-06-07 NOTE — Telephone Encounter (Signed)
Spoke with the pt He was seen by Dr Halford Chessman 06/05/20  He states that his cough is no better since then  Not having any increased SOB, wheezing, chest tightness His cough is non prod He take the tessalon bid and the albuterol inhaler every 6 hours  He has started taking a tbs of honey and states it helps for about an hour and then cough returns  He is also taking his xyzal daily  Any other recs? Thanks!  Allergies  Allergen Reactions  . Sulfa Antibiotics Other (See Comments)    High potassium  . Ramipril Cough  . Versed [Midazolam] Other (See Comments)    "I don't wake up very good or clear it out of my system"

## 2020-06-07 NOTE — Telephone Encounter (Signed)
Pt called back and wanted to know if he could come in for a depo injection instead of taking the prednisone.  VS please advise. Thanks

## 2020-06-07 NOTE — Telephone Encounter (Signed)
He would need to schedule a visit for that.  If he is okay with that, then schedule a visit.

## 2020-06-07 NOTE — Telephone Encounter (Signed)
Called and spoke with patient. He stated that he went ahead and picked up the prednisone from the pharmacy and will start taking it. He is ok with holding off on the additional OV/depo injection for now. I advised him to let us know if the prednisone does not work. He verbalized understanding.   Nothing further needed at time of call.

## 2020-06-08 ENCOUNTER — Ambulatory Visit: Payer: Self-pay | Admitting: Adult Health

## 2020-06-08 ENCOUNTER — Telehealth: Payer: Self-pay | Admitting: Pulmonary Disease

## 2020-06-08 DIAGNOSIS — D631 Anemia in chronic kidney disease: Secondary | ICD-10-CM | POA: Diagnosis not present

## 2020-06-08 DIAGNOSIS — N2581 Secondary hyperparathyroidism of renal origin: Secondary | ICD-10-CM | POA: Diagnosis not present

## 2020-06-08 DIAGNOSIS — D509 Iron deficiency anemia, unspecified: Secondary | ICD-10-CM | POA: Diagnosis not present

## 2020-06-08 DIAGNOSIS — N186 End stage renal disease: Secondary | ICD-10-CM | POA: Diagnosis not present

## 2020-06-08 DIAGNOSIS — E119 Type 2 diabetes mellitus without complications: Secondary | ICD-10-CM | POA: Diagnosis not present

## 2020-06-08 DIAGNOSIS — E8779 Other fluid overload: Secondary | ICD-10-CM | POA: Diagnosis not present

## 2020-06-08 NOTE — Telephone Encounter (Signed)
Called and spoke with pt and he is aware of appt with TP  On 05/06 at 9:30

## 2020-06-08 NOTE — Telephone Encounter (Signed)
Can offer acute visit with APP tomorrow.   Garner Nash, DO [facility] 06/08/2020 5:58 PM

## 2020-06-08 NOTE — Telephone Encounter (Signed)
Called and spoke with Patient.  Patient stated he can not stop coughing.  Patient stated cough is still non productive and he is unable to rest.  Patient stated he has taken Tessalon, honey, cough med, and using his inhaler with no relief.  Patient is asking if something else can be given or recommendations to relieve cough, and get rest.  Message routed to Dr. Halford Chessman to advise

## 2020-06-09 ENCOUNTER — Other Ambulatory Visit: Payer: Self-pay

## 2020-06-09 ENCOUNTER — Ambulatory Visit (INDEPENDENT_AMBULATORY_CARE_PROVIDER_SITE_OTHER): Payer: Medicare Other

## 2020-06-09 ENCOUNTER — Ambulatory Visit: Payer: Medicare Other | Admitting: Adult Health

## 2020-06-09 ENCOUNTER — Encounter: Payer: Self-pay | Admitting: Adult Health

## 2020-06-09 VITALS — BP 130/70 | HR 101 | Temp 100.3°F | Ht 70.0 in | Wt 210.6 lb

## 2020-06-09 DIAGNOSIS — N186 End stage renal disease: Secondary | ICD-10-CM | POA: Diagnosis not present

## 2020-06-09 DIAGNOSIS — R059 Cough, unspecified: Secondary | ICD-10-CM | POA: Diagnosis not present

## 2020-06-09 DIAGNOSIS — J301 Allergic rhinitis due to pollen: Secondary | ICD-10-CM | POA: Diagnosis not present

## 2020-06-09 DIAGNOSIS — Z992 Dependence on renal dialysis: Secondary | ICD-10-CM

## 2020-06-09 DIAGNOSIS — R0600 Dyspnea, unspecified: Secondary | ICD-10-CM | POA: Diagnosis not present

## 2020-06-09 DIAGNOSIS — B20 Human immunodeficiency virus [HIV] disease: Secondary | ICD-10-CM

## 2020-06-09 DIAGNOSIS — R053 Chronic cough: Secondary | ICD-10-CM | POA: Diagnosis not present

## 2020-06-09 DIAGNOSIS — J209 Acute bronchitis, unspecified: Secondary | ICD-10-CM | POA: Diagnosis not present

## 2020-06-09 MED ORDER — BENZONATATE 200 MG PO CAPS: 200.0000 mg | ORAL_CAPSULE | Freq: Three times a day (TID) | ORAL | 1 refills | Status: DC | PRN

## 2020-06-09 MED ORDER — DOXYCYCLINE HYCLATE 100 MG PO TABS: 100.0000 mg | ORAL_TABLET | Freq: Two times a day (BID) | ORAL | 0 refills | Status: DC

## 2020-06-09 NOTE — Assessment & Plan Note (Signed)
Blood sugars have been elevated as he is on steroids.  Advised to continue with his current insulin regimen.  Follow-up with endocrinology if blood sugars remain over 250 he is to contact his endocrinologist for further evaluation.

## 2020-06-09 NOTE — Assessment & Plan Note (Signed)
Continue on maintenance regimen.  Finish up prednisone as directed

## 2020-06-09 NOTE — Progress Notes (Signed)
@Patient  ID: Patrick Brown, male    DOB: 1970/06/08, 50 y.o.   MRN: [id number]  Chief Complaint  Patient presents with  . Acute Visit    Referring provider: Dettinger, Fransisca Kaufmann, MD  HPI: 50 year old male never smoker with complicated medical history including HIV disease, end-stage renal disease on dialysis (Tuesday/Thursday/Saturday) insulin-dependent diabetes-insulin pump . OSA on CPAP  History of PCP pneumonia  TEST/EVENTS :   PSG 09/26/14 >> AHI 11.3, SpO2 low 86%, PLMI 51.9 Underwent FOB 12/15/12 w/ essentially unremarbkable airway and  cytology neg for malignant cells, neg AFB and fungal cx. (final).    06/09/2020 Follow up : Cough  Patient returns with ongoing cough that is minimally productive has some intermittent episodes of low-grade fevers..  Patient complains of 4 weeks of ongoing cough.  Has associated postnasal drainage.  Was initially seen by his primary care provider and given treatment for allergic rhinitis.  Patient said cough continued.  He was seen in our office on May 2.  Started on albuterol inhaler and recommend to use [employer]  Patient did have ongoing cough and was called in a prednisone taper 2 days ago.  Patient has seen a slight improvement in cough but continues to have ongoing coughing episodes.  Patient complains that his cough is very severe in nature.  Has a violent coughing fits.  Has been using Tessalon and Delsym intermittently without much relief.  Has also used some honey and did get a prescription for hydrocodone cough syrup which has helped some. Patient denies any hemoptysis, chest pain, orthopnea, edema.  Chest x-ray on April 14 showed clear lungs.  Patient has not been COVID tested.  Is fully vaccinated including booster x1  Has End-stage renal disease on dialysis, patient goes Tuesday Thursday and Saturday.  He is currently on the active kidney transplant list. Patient does have HIV disease is followed by infectious disease.  He endorses  compliance with his antivirals.  Appetite has been good with no nausea vomiting or diarrhea.   Allergies  Allergen Reactions  . Sulfa Antibiotics Other (See Comments)    High potassium  . Ramipril Cough  . Versed [Midazolam] Other (See Comments)    "I don't wake up very good or clear it out of my system"    Immunization History  Administered Date(s) Administered  . Hepatitis A 12/23/2013, 09/05/2014, 11/18/2019  . Hepatitis A, Adult 12/23/2013, 09/05/2014  . Hepatitis B, adult 12/23/2013, 02/01/2014, 09/05/2014  . Hepatitis B, ped/adol 12/23/2013, 02/01/2014, 09/05/2014  . Influenza Split 11/05/2011  . Influenza,inj,Quad PF,6+ Mos 11/15/2013, 11/28/2014, 11/10/2015, 11/13/2016, 10/27/2017, 10/07/2018, 11/26/2019  . Influenza,inj,Quad PF,6-35 Mos 11/26/2019  . Influenza-Unspecified 11/12/2012  . Meningococcal Conjugate 03/26/2016, 10/27/2017  . Meningococcal Mcv4o 03/26/2016, 10/27/2017  . Moderna Sars-Covid-2 Vaccination 03/23/2019, 04/20/2019, 10/19/2019  . Pneumococcal Conjugate-13 10/27/2017  . Pneumococcal Polysaccharide-23 06/18/2012, 11/24/2018  . Tdap 11/27/2010, 11/18/2019  . Zoster Recombinat (Shingrix) 10/08/2019, 02/09/2020    Past Medical History:  Diagnosis Date  . Anemia, iron deficiency On procrit  . CAP (community acquired pneumonia)   . CKD (chronic kidney disease) stage 3, GFR 30-59 ml/min (HCC)   . Degenerative arthritis   . Depression   . Dyslipidemia   . Gastroesophageal reflux disease   . Gastroparesis diabeticorum ([place])   . Hematuria, microscopic 10/09   work up negative (Dr. Amalia Hailey)  . HIV positive ([place])   . Hyperkalemia, diminished renal excretion 06/2011 secondary to TMP/SMZ; prior secondary to  ARBS;    Known potassium excretory defect;  history of recurrent hyperkalemia due to diabetic renal disease; ACE/ARB contraindicated; hyperkalemia 06/2011 secondary to TMP-SMZ  . Hypothyroidism   . IDDM (insulin dependent diabetes mellitus)    38 years  .  Low HDL (under 40)   . Proteinuria   . Retinopathy    x2  . SIRS (systemic inflammatory response syndrome) (HCC)     Tobacco History: Social History   Tobacco Use  Smoking Status Never Smoker  Smokeless Tobacco Never Used   Counseling given: Not Answered   Outpatient Medications Prior to Visit  Medication Sig Dispense Refill  . acetaminophen (TYLENOL) 500 MG tablet Take 1,000 mg by mouth 2 (two) times daily.     Marland Kitchen acyclovir (ZOVIRAX) 400 MG tablet Takes mondays, wednesdays and fridays    . [employer] Ultra-Thin Lancets MISC TEST BS 4 TIMES A DAY AND AS NEEDED DX E10.65 400 each 3  . albuterol (VENTOLIN HFA) 108 (90 Base) MCG/ACT inhaler Inhale 2 puffs into the lungs every 6 (six) hours as needed for wheezing or shortness of breath. 8 g 5  . aspirin 81 MG chewable tablet Chew 81 mg by mouth every morning.    . cyclobenzaprine (FLEXERIL) 10 MG tablet TAKE 1 TABLET BY MOUTH THREE TIMES A DAY AS NEEDED FOR MUSCLE SPASMS 30 tablet 3  . diclofenac sodium (VOLTAREN) 1 % GEL Apply 2 g topically 4 (four) times daily. 350 g 3  . diphenoxylate-atropine (LOMOTIL) 2.5-0.025 MG tablet Take 1 tablet by mouth 4 (four) times daily as needed for diarrhea or loose stools. 60 tablet 2  . Dolutegravir-lamiVUDine (DOVATO) 50-300 MG TABS Take 1 tablet by mouth daily. 30 tablet 11  . DULoxetine (CYMBALTA) 30 MG capsule Take 1 capsule (30 mg total) by mouth daily. Take with the 72m for total of 90mg  90 capsule 3  . DULoxetine (CYMBALTA) 60 MG capsule Take 1 capsule (60 mg total) by mouth daily. Take with 30 mg fot a total of 90mg  90 capsule 3  . esomeprazole (NEXIUM) 40 MG capsule TAKE 1 CAPSULE BY MOUTH EVERY DAY 90 capsule 0  . febuxostat (ULORIC) 40 MG tablet TAKE 1 TABLET BY MOUTH EVERY DAY 90 tablet 0  . ferrous sulfate 325 (65 FE) MG tablet Take 650 mg by mouth daily with breakfast.     . fluticasone (FLONASE) 50 MCG/ACT nasal spray SPRAY 2 SPRAYS INTO EACH NOSTRIL EVERY DAY 48 mL 1  . furosemide  (LASIX) 40 MG tablet Take 40 mg by mouth daily. 40 mg nightly  4  . Glucosamine-Chondroit-Vit C-Mn (GLUCOSAMINE 1500 COMPLEX PO) Take 1 tablet by mouth 2 (two) times daily.     Marland Kitchen glucosamine-chondroitin 500-400 MG tablet Take by mouth.     Marland Kitchen glucose blood (ONETOUCH VERIO) test strip TEST BLOOD SUGAR 4 TIMES DAILY AND AS NEEDED 300 each 4  . hydrOXYzine (ATARAX/VISTARIL) 25 MG tablet TAKE 1 TABLET BY MOUTH EVERY 8 HOURS AS NEEDED 270 tablet 0  . hyoscyamine (LEVSIN) 0.125 MG tablet TAKE 1 TABLET (0.125 MG TOTAL) BY MOUTH EVERY 4 (FOUR) HOURS AS NEEDED. 60 tablet 0  . insulin glargine (LANTUS) 100 UNIT/ML injection In the event of insulin pump failure, inject 8 units twice daily    . insulin lispro (HUMALOG) 100 UNIT/ML injection USE 42 UNITS TO 120 UNITS PER PUMP DAILY AS DIRECTED 90 mL 3  . ipratropium (ATROVENT) 0.03 % nasal spray USE 2 SPRAYS IN EACH NOSTRIL 2-3 TIMES DAILY 30 mL 1  . levocetirizine (XYZAL) 5 MG tablet Take 1  tablet (5 mg total) by mouth every evening. 90 tablet 3  . levothyroxine (SYNTHROID) 175 MCG tablet Take 1 tablet (175 mcg total) by mouth daily before breakfast. 90 tablet 1  . lipase/protease/amylase (CREON) 12000-38000 units CPEP capsule Take 2 capsules prior to meals and 1 capsule prior to snacks    . meclizine (ANTIVERT) 12.5 MG tablet Take 1 tablet (12.5 mg total) by mouth 3 (three) times daily as needed for dizziness. 30 tablet 3  . metoCLOPramide (REGLAN) 5 MG tablet Take 1 tablet (5 mg total) by mouth 3 (three) times daily before meals. 270 tablet 3  . niacin (NIASPAN) 1000 MG CR tablet TAKE 1 TABLET (1,000 MG TOTAL) BY MOUTH AT BEDTIME. 30 tablet 5  . ondansetron (ZOFRAN) 4 MG tablet Take 1 tablet (4 mg total) by mouth every 8 (eight) hours as needed for nausea. 45 tablet 6  . predniSONE (DELTASONE) 10 MG tablet Take 4 tablets (40 mg total) by mouth daily with breakfast for 2 days, THEN 3 tablets (30 mg total) daily with breakfast for 2 days, THEN 2 tablets (20 mg  total) daily with breakfast for 2 days, THEN 1 tablet (10 mg total) daily with breakfast for 2 days. 20 tablet 0  . Probiotic Product (MISC INTESTINAL FLORA REGULAT) CAPS Take 1 capsule by mouth every morning.    . promethazine (PHENERGAN) 25 MG suppository Place 1 suppository (25 mg total) rectally every 6 (six) hours as needed for nausea or vomiting. 30 each 0  . rosuvastatin (CRESTOR) 20 MG tablet Take 20 mg by mouth at bedtime.    . sevelamer carbonate (RENVELA) 800 MG tablet Take 800 mg by mouth 3 (three) times daily. 2 tabs before each meal    . tamsulosin (FLOMAX) 0.4 MG CAPS capsule Take 0.4 mg by mouth daily.    . Testosterone 20.25 MG/ACT (1.62%) GEL APPLY 3 PUMPS DAILY AS DIRECTED 75 g 0  . traZODone (DESYREL) 50 MG tablet Take by mouth.    Marland Kitchen VASCEPA 1 g capsule TAKE 2 CAPSULES BY MOUTH TWICE A DAY 120 capsule 2  . benzonatate (TESSALON) 100 MG capsule Take 1 capsule (100 mg total) by mouth 2 (two) times daily as needed for cough. 30 capsule 1  . losartan (COZAAR) 50 MG tablet Take 50 mg by mouth daily. Takes when not on dialysis. (Patient not taking: Reported on 06/09/2020)     No facility-administered medications prior to visit.     Review of Systems:   Constitutional:   No  weight loss, night sweats,  Fevers, chills,  +fatigue, or  lassitude.  HEENT:   No headaches,  Difficulty swallowing,  Tooth/dental problems, or  Sore throat,                No sneezing, itching, ear ache, + nasal congestion, post nasal drip,   CV:  No chest pain,  Orthopnea, PND, swelling in lower extremities, anasarca, dizziness, palpitations, syncope.   GI  No heartburn, indigestion, abdominal pain, nausea, vomiting, diarrhea, change in bowel habits, loss of appetite, bloody stools.   Resp:  No chest wall deformity  Skin: no rash or lesions.  GU: no dysuria, change in color of urine, no urgency or frequency.  No flank pain, no hematuria   MS:  No joint pain or swelling.  No decreased range of  motion.  No back pain.    Physical Exam  BP 130/70 (BP Location: Right Arm, Patient Position: Sitting, Cuff Size: Normal)   Pulse Marland Kitchen)  101   Temp 100.3 F (37.9 C) (Temporal)   Ht 5\' 10"  (1.778 m)   Wt 210 lb 9.6 oz (95.5 kg)   SpO2 90%   BMI 30.22 kg/m   GEN: A/Ox3; pleasant , NAD, well nourished    HEENT:  [place]/AT,  NOSE-clear, THROAT-clear, no lesions, no postnasal drip or exudate noted.   NECK:  Supple w/ fair ROM; no JVD; normal carotid impulses w/o bruits; no thyromegaly or nodules palpated; no lymphadenopathy.    RESP  Clear  P & A; w/o, wheezes/ rales/ or rhonchi. no accessory muscle use, no dullness to percussion  CARD:  RRR, no m/r/g, no peripheral edema, pulses intact, no cyanosis or clubbing. Left AV fistula   GI:   Soft & nt; nml bowel sounds; no organomegaly or masses detected.   Musco: Warm bil, no deformities or joint swelling noted.   Neuro: alert, no focal deficits noted.    Skin: Warm, no lesions or rashes    Lab Results:   BNP No results found for: BNP  ProBNP  Imaging: DG Chest 2 View  Result Date: 05/18/2020 CLINICAL DATA:  Cough EXAM: CHEST - 2 VIEW COMPARISON:  September 01, 2019 FINDINGS: Lungs are clear. Heart size and pulmonary vascularity are normal. No adenopathy. Anterior wedging of upper thoracic vertebral bodies is stable. IMPRESSION: No edema or airspace opacity.  Heart size normal. Electronically Signed   By: Lowella Grip III M.D.   On: 05/18/2020 10:18      No flowsheet data found.  No results found for: NITRICOXIDE      Assessment & Plan:   Acute bronchitis Acute bronchitis slow to resolve. Repeat chest x-ray today. Will treat with empiric antibiotics.  Check COVID-19 testing.  Finish up steroids.  Patient education on steroids as patient has underlying insulin-dependent diabetes. Albuterol inhaler as needed.  Continue on cough control regimen  Plan  Patient Instructions  Covid 19 test , call back if positive .   Chest xray today .  Doxycycline 100mg  Twice daily  For 1 week .  Finish Prednisone taper .  Robitussin DM 1-2 tsp every 4hrs for cough As needed   Tessalon 200mg  Three times a day  For cough as needed.  Sips of water , honey to soothe throat and avoid throat clearing /coughing  Albuterol inhaler As needed   Follow up with Dr. Halford Chessman  In 2 weeks and As needed   Please contact office for sooner follow up if symptoms do not improve or worsen or seek emergency care     '   Non-seasonal allergic rhinitis due to pollen Continue on maintenance regimen.  Finish up prednisone as directed  Type 1 diabetes mellitus ([place]) Blood sugars have been elevated as he is on steroids.  Advised to continue with his current insulin regimen.  Follow-up with endocrinology if blood sugars remain over 250 he is to contact his endocrinologist for further evaluation.  ESRD on hemodialysis [facility]) Continue with hemodialysis as directed.  HIV disease ([place]) Continue current regimen and follow with ID     Rexene Edison, NP 06/09/2020

## 2020-06-09 NOTE — Progress Notes (Signed)
Reviewed and agree with assessment/plan.   Chesley Mires, MD [place] 06/09/2020, 12:51 PM Pager:  [phone]

## 2020-06-09 NOTE — Patient Instructions (Addendum)
Covid 19 test , call back if positive .  Chest xray today .  Doxycycline 100mg  Twice daily  For 1 week .  Finish Prednisone taper .  Robitussin DM 1-2 tsp every 4hrs for cough As needed   Tessalon 200mg  Three times a day  For cough as needed.  Sips of water , honey to soothe throat and avoid throat clearing /coughing  Albuterol inhaler As needed   Follow up with Dr. Halford Chessman  In 2 weeks and As needed   Please contact office for sooner follow up if symptoms do not improve or worsen or seek emergency care

## 2020-06-09 NOTE — Assessment & Plan Note (Signed)
Acute bronchitis slow to resolve. Repeat chest x-ray today. Will treat with empiric antibiotics.  Check COVID-19 testing.  Finish up steroids.  Patient education on steroids as patient has underlying insulin-dependent diabetes. Albuterol inhaler as needed.  Continue on cough control regimen  Plan  Patient Instructions  Covid 19 test , call back if positive .  Chest xray today .  Doxycycline 100mg  Twice daily  For 1 week .  Finish Prednisone taper .  Robitussin DM 1-2 tsp every 4hrs for cough As needed   Tessalon 200mg  Three times a day  For cough as needed.  Sips of water , honey to soothe throat and avoid throat clearing /coughing  Albuterol inhaler As needed   Follow up with Dr. Halford Chessman  In 2 weeks and As needed   Please contact office for sooner follow up if symptoms do not improve or worsen or seek emergency care     '

## 2020-06-09 NOTE — Assessment & Plan Note (Signed)
Continue with hemodialysis as directed.

## 2020-06-09 NOTE — Assessment & Plan Note (Signed)
Continue current regimen and follow with ID

## 2020-06-10 DIAGNOSIS — E8779 Other fluid overload: Secondary | ICD-10-CM | POA: Diagnosis not present

## 2020-06-10 DIAGNOSIS — N2581 Secondary hyperparathyroidism of renal origin: Secondary | ICD-10-CM | POA: Diagnosis not present

## 2020-06-10 DIAGNOSIS — D509 Iron deficiency anemia, unspecified: Secondary | ICD-10-CM | POA: Diagnosis not present

## 2020-06-10 DIAGNOSIS — D631 Anemia in chronic kidney disease: Secondary | ICD-10-CM | POA: Diagnosis not present

## 2020-06-10 DIAGNOSIS — N186 End stage renal disease: Secondary | ICD-10-CM | POA: Diagnosis not present

## 2020-06-11 ENCOUNTER — Other Ambulatory Visit: Payer: Self-pay | Admitting: Family Medicine

## 2020-06-11 ENCOUNTER — Encounter: Payer: Self-pay | Admitting: *Deleted

## 2020-06-11 DIAGNOSIS — Z20822 Contact with and (suspected) exposure to covid-19: Secondary | ICD-10-CM | POA: Diagnosis not present

## 2020-06-13 DIAGNOSIS — E8779 Other fluid overload: Secondary | ICD-10-CM | POA: Diagnosis not present

## 2020-06-13 DIAGNOSIS — D509 Iron deficiency anemia, unspecified: Secondary | ICD-10-CM | POA: Diagnosis not present

## 2020-06-13 DIAGNOSIS — N186 End stage renal disease: Secondary | ICD-10-CM | POA: Diagnosis not present

## 2020-06-13 DIAGNOSIS — D631 Anemia in chronic kidney disease: Secondary | ICD-10-CM | POA: Diagnosis not present

## 2020-06-13 DIAGNOSIS — N2581 Secondary hyperparathyroidism of renal origin: Secondary | ICD-10-CM | POA: Diagnosis not present

## 2020-06-14 ENCOUNTER — Ambulatory Visit (INDEPENDENT_AMBULATORY_CARE_PROVIDER_SITE_OTHER): Payer: Medicare Other

## 2020-06-14 ENCOUNTER — Encounter: Payer: Self-pay | Admitting: Primary Care

## 2020-06-14 ENCOUNTER — Ambulatory Visit: Payer: Medicare Other | Admitting: Primary Care

## 2020-06-14 ENCOUNTER — Telehealth: Payer: Self-pay | Admitting: Pulmonary Disease

## 2020-06-14 ENCOUNTER — Telehealth: Payer: Self-pay | Admitting: Primary Care

## 2020-06-14 ENCOUNTER — Other Ambulatory Visit: Payer: Self-pay

## 2020-06-14 VITALS — BP 160/80 | HR 94 | Temp 97.9°F | Ht 70.0 in | Wt 207.2 lb

## 2020-06-14 DIAGNOSIS — J209 Acute bronchitis, unspecified: Secondary | ICD-10-CM

## 2020-06-14 MED ORDER — METHYLPREDNISOLONE ACETATE 80 MG/ML IJ SUSP
120.0000 mg | Freq: Once | INTRAMUSCULAR | Status: AC
  Administered 2020-06-14: 120 mg via INTRAMUSCULAR

## 2020-06-14 MED ORDER — ALBUTEROL SULFATE (2.5 MG/3ML) 0.083% IN NEBU: 2.5000 mg | INHALATION_SOLUTION | Freq: Four times a day (QID) | RESPIRATORY_TRACT | 12 refills | Status: DC | PRN

## 2020-06-14 MED ORDER — DOXYCYCLINE HYCLATE 100 MG PO TABS: 100.0000 mg | ORAL_TABLET | Freq: Two times a day (BID) | ORAL | 0 refills | Status: DC

## 2020-06-14 MED ORDER — HYDROCODONE BIT-HOMATROP MBR 5-1.5 MG/5ML PO SOLN: 5.0000 mL | Freq: Four times a day (QID) | ORAL | 0 refills | Status: DC | PRN

## 2020-06-14 MED ORDER — PREDNISONE 10 MG PO TABS: ORAL_TABLET | ORAL | 0 refills | Status: DC

## 2020-06-14 NOTE — Telephone Encounter (Signed)
If he is getting worse on antibiotics and steroids He has high risk will need to come in for further evaluation Chest x-ray had showed a possible pneumonia We have openings this afternoon and tomorrow  Please contact office for sooner follow up if symptoms do not improve or worsen or seek emergency care

## 2020-06-14 NOTE — Assessment & Plan Note (Addendum)
-   Slow to resolve. He continues to have a very harsh and congested cough. Covid testing has been negative. No other significant respiratory symptoms. Denies shortness of breath, PND or sinusitis symptoms.  Repeat CXR today showed clear lungs. Patient received Depomedrol 120mg  IM in office today. Plan prednisone 20mg  x 5 days; 10mg  x 5 days and continue Doxycycline for total 10 days. Recommend he take Mucinex 1200mg  twice daily, use Albuterol nebulizer every 6 hours and given flutter valve for him to use THREE times a day. Hycodan cough syrup 20ml q6 hours for cough suppression when needed. Unable to produce sputum culture d.t cough being non-productive. Advised him to monitor BS if > 300 please notify PCP. Follow-up in 10 days with either TP or BW

## 2020-06-14 NOTE — Progress Notes (Signed)
Reviewed and agree with assessment/plan.   Chesley Mires, MD [facility] 06/14/2020, 5:10 PM Pager:  [phone]

## 2020-06-14 NOTE — Telephone Encounter (Signed)
Spoke with the pt He was seen on 06/09/20  Had covid test done 06/11/20 and this was neg  He states his cough has been progressively worse since he was seen 5/6 The cough is non prod- using robitussin and tessalon without any relief  He is finished with pred as of this morning, still has a few days left on Doxy  He is asking if he needs a stronger abx and also if we can send rx for hydrocodone cough syrup  He states that his mother is sick with sinus infection and he cares for her and he feels bad too so he would not be able to come in for another ov this wk  Please advise thanks!  Allergies  Allergen Reactions  . Sulfa Antibiotics Other (See Comments)    High potassium  . Ramipril Cough  . Versed [Midazolam] Other (See Comments)    "I don't wake up very good or clear it out of my system"

## 2020-06-14 NOTE — Progress Notes (Signed)
Just FYI, CXR looked ok. Ill send you both my note

## 2020-06-14 NOTE — Telephone Encounter (Signed)
I didn't realize he had an apt with Dr. Halford Chessman on 6/3. He can cancel apt with TP on 5/23 if doing better by then. If not keep apt.

## 2020-06-14 NOTE — Progress Notes (Signed)
@Patient  ID: Patrick Brown, male    DOB: 28-Jul-1970, 50 y.o.   MRN: [id number]  Chief Complaint  Patient presents with  . Acute Visit    Referring provider: Dettinger, Fransisca Kaufmann, MD  HPI: 50 year old male never smoker with complicated medical history including HIV disease, end-stage renal disease on dialysis (Tuesday/Thursday/Saturday) insulin-dependent diabetes-insulin pump . OSA on CPAP  History of PCP pneumonia  Previous [facility] pulmonary encounter: 06/09/2020 Follow up : Cough  Patient returns with ongoing cough that is minimally productive has some intermittent episodes of low-grade fevers..  Patient complains of 4 weeks of ongoing cough.  Has associated postnasal drainage.  Was initially seen by his primary care provider and given treatment for allergic rhinitis.  Patient said cough continued.  He was seen in our office on May 2.  Started on albuterol inhaler and recommend to use [employer]  Patient did have ongoing cough and was called in a prednisone taper 2 days ago.  Patient has seen a slight improvement in cough but continues to have ongoing coughing episodes.  Patient complains that his cough is very severe in nature.  Has a violent coughing fits.  Has been using Tessalon and Delsym intermittently without much relief.  Has also used some honey and did get a prescription for hydrocodone cough syrup which has helped some. Patient denies any hemoptysis, chest pain, orthopnea, edema.  Chest x-ray on April 14 showed clear lungs.  Patient has not been COVID tested.  Is fully vaccinated including booster x1  Has End-stage renal disease on dialysis, patient goes Tuesday Thursday and Saturday.  He is currently on the active kidney transplant list. Patient does have HIV disease is followed by infectious disease.  He endorses compliance with his antivirals.  Appetite has been good with no nausea vomiting or diarrhea.   06/14/2020- Interim hx Patient presents today for acute visit. He was seen  5 days ago in office and treated for bronchitis symptoms with Doxycycline 100mg  BID x 7 days and instructed to finish prednisone taper that was prescribed back on 06/07/20. CXR on 06/09/20 showed increased opacification along the left heart border. He finished prednisone today. He has 2-3 days of Doxycycline left. He continues to not feel well. He has a severly congested cough which is non-productive. He takes care of his mother who currently has a sinus infection. He has been tested for covid which came back negative. He denies nasal congestion, he is taking xyzal. Unable to do sputum culture d/t cough being non-productive. BS running 200s.     TEST/EVENTS :  PSG 09/26/14 >> AHI 11.3, SpO2 low 86%, PLMI 51.9 Underwent FOB 12/15/12 w/ essentially unremarbkable airway and  cytology neg for malignant cells, neg AFB and fungal cx. (final).   Allergies  Allergen Reactions  . Sulfa Antibiotics Other (See Comments)    High potassium  . Ramipril Cough  . Versed [Midazolam] Other (See Comments)    "I don't wake up very good or clear it out of my system"    Immunization History  Administered Date(s) Administered  . Hepatitis A 12/23/2013, 09/05/2014, 11/18/2019  . Hepatitis A, Adult 12/23/2013, 09/05/2014  . Hepatitis B, adult 12/23/2013, 02/01/2014, 09/05/2014  . Hepatitis B, ped/adol 12/23/2013, 02/01/2014, 09/05/2014  . Influenza Split 11/05/2011  . Influenza,inj,Quad PF,6+ Mos 11/15/2013, 11/28/2014, 11/10/2015, 11/13/2016, 10/27/2017, 10/07/2018, 11/26/2019  . Influenza,inj,Quad PF,6-35 Mos 11/26/2019  . Influenza-Unspecified 11/12/2012  . Meningococcal Conjugate 03/26/2016, 10/27/2017  . Meningococcal Mcv4o 03/26/2016, 10/27/2017  . Moderna Sars-Covid-2 Vaccination 03/23/2019,  04/20/2019, 10/19/2019  . Pneumococcal Conjugate-13 10/27/2017  . Pneumococcal Polysaccharide-23 06/18/2012, 11/24/2018  . Tdap 11/27/2010, 11/18/2019  . Zoster Recombinat (Shingrix) 10/08/2019, 02/09/2020    Past  Medical History:  Diagnosis Date  . Anemia, iron deficiency On procrit  . CAP (community acquired pneumonia)   . CKD (chronic kidney disease) stage 3, GFR 30-59 ml/min (HCC)   . Degenerative arthritis   . Depression   . Dyslipidemia   . Gastroesophageal reflux disease   . Gastroparesis diabeticorum ([place])   . Hematuria, microscopic 10/09   work up negative (Dr. Amalia Hailey)  . HIV positive ([place])   . 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 Hypothyroidism   . IDDM (insulin dependent diabetes mellitus)    38 years  . Low HDL (under 40)   . Proteinuria   . Retinopathy    x2  . SIRS (systemic inflammatory response syndrome) (HCC)     Tobacco History: Social History   Tobacco Use  Smoking Status Never Smoker  Smokeless Tobacco Never Used   Counseling given: Not Answered   Outpatient Medications Prior to Visit  Medication Sig Dispense Refill  . doxycycline (VIBRA-TABS) 100 MG tablet Take 1 tablet (100 mg total) by mouth 2 (two) times daily. 14 tablet 0  . acetaminophen (TYLENOL) 500 MG tablet Take 1,000 mg by mouth 2 (two) times daily.     Marland Kitchen acyclovir (ZOVIRAX) 400 MG tablet Takes mondays, wednesdays and fridays    . [employer] Ultra-Thin Lancets MISC TEST BS 4 TIMES A DAY AND AS NEEDED DX E10.65 400 each 3  . albuterol (VENTOLIN HFA) 108 (90 Base) MCG/ACT inhaler Inhale 2 puffs into the lungs every 6 (six) hours as needed for wheezing or shortness of breath. 8 g 5  . aspirin 81 MG chewable tablet Chew 81 mg by mouth every morning.    . benzonatate (TESSALON) 200 MG capsule Take 1 capsule (200 mg total) by mouth 3 (three) times daily as needed for cough. 30 capsule 1  . cyclobenzaprine (FLEXERIL) 10 MG tablet TAKE 1 TABLET BY MOUTH THREE TIMES A DAY AS NEEDED FOR  MUSCLE SPASMS 30 tablet 3  . diclofenac sodium (VOLTAREN) 1 % GEL Apply 2 g topically 4 (four) times daily. 350 g 3  . diphenoxylate-atropine (LOMOTIL) 2.5-0.025 MG tablet Take 1 tablet by mouth 4 (four) times daily as needed for diarrhea or loose stools. 60 tablet 2  . Dolutegravir-lamiVUDine (DOVATO) 50-300 MG TABS Take 1 tablet by mouth daily. 30 tablet 11  . DULoxetine (CYMBALTA) 30 MG capsule TAKE 1 CAPSULE (30 MG TOTAL) BY MOUTH DAILY. TAKE WITH THE 60MG  FOR TOTAL OF 90MG  90 capsule 0  . DULoxetine (CYMBALTA) 60 MG capsule TAKE 1 CAPSULE (60 MG TOTAL) BY MOUTH DAILY. TAKE WITH 30 MG FOT A TOTAL OF 90MG  90 capsule 0  . esomeprazole (NEXIUM) 40 MG capsule TAKE 1 CAPSULE BY MOUTH EVERY DAY 90 capsule 0  . febuxostat (ULORIC) 40 MG tablet TAKE 1 TABLET BY MOUTH EVERY DAY 90 tablet 0  . ferrous sulfate 325 (65 FE) MG tablet Take 650 mg by mouth daily with breakfast.     . fluticasone (FLONASE) 50 MCG/ACT nasal spray SPRAY 2 SPRAYS INTO EACH NOSTRIL EVERY DAY 48 mL 1  . furosemide (LASIX) 40 MG tablet Take 40 mg by mouth daily.  40 mg nightly  4  . Glucosamine-Chondroit-Vit C-Mn (GLUCOSAMINE 1500 COMPLEX PO) Take 1 tablet by mouth 2 (two) times daily.     Marland Kitchen glucosamine-chondroitin 500-400 MG tablet Take by mouth.     Marland Kitchen glucose blood (ONETOUCH VERIO) test strip TEST BLOOD SUGAR 4 TIMES DAILY AND AS NEEDED 300 each 4  . hydrOXYzine (ATARAX/VISTARIL) 25 MG tablet TAKE 1 TABLET BY MOUTH EVERY 8 HOURS AS NEEDED 270 tablet 0  . hyoscyamine (LEVSIN) 0.125 MG tablet TAKE 1 TABLET (0.125 MG TOTAL) BY MOUTH EVERY 4 (FOUR) HOURS AS NEEDED. 60 tablet 0  . insulin glargine (LANTUS) 100 UNIT/ML injection In the event of insulin pump failure, inject 8 units twice daily    . insulin lispro (HUMALOG) 100 UNIT/ML injection USE 42 UNITS TO 120 UNITS PER PUMP DAILY AS DIRECTED 90 mL 3  . ipratropium (ATROVENT) 0.03 % nasal spray USE 2 SPRAYS IN EACH NOSTRIL 2-3 TIMES DAILY 30 mL 1  . levocetirizine (XYZAL) 5 MG tablet  Take 1 tablet (5 mg total) by mouth every evening. 90 tablet 3  . levothyroxine (SYNTHROID) 175 MCG tablet Take 1 tablet (175 mcg total) by mouth daily before breakfast. 90 tablet 1  . lipase/protease/amylase (CREON) 12000-38000 units CPEP capsule Take 2 capsules prior to meals and 1 capsule prior to snacks    . meclizine (ANTIVERT) 12.5 MG tablet Take 1 tablet (12.5 mg total) by mouth 3 (three) times daily as needed for dizziness. 30 tablet 3  . metoCLOPramide (REGLAN) 5 MG tablet Take 1 tablet (5 mg total) by mouth 3 (three) times daily before meals. 270 tablet 3  . niacin (NIASPAN) 1000 MG CR tablet TAKE 1 TABLET (1,000 MG TOTAL) BY MOUTH AT BEDTIME. 30 tablet 5  . ondansetron (ZOFRAN) 4 MG tablet Take 1 tablet (4 mg total) by mouth every 8 (eight) hours as needed for nausea. 45 tablet 6  . Probiotic Product (MISC INTESTINAL FLORA REGULAT) CAPS Take 1 capsule by mouth every morning.    . promethazine (PHENERGAN) 25 MG suppository Place 1 suppository (25 mg total) rectally every 6 (six) hours as needed for nausea or vomiting. 30 each 0  . rosuvastatin (CRESTOR) 20 MG tablet Take 20 mg by mouth at bedtime.    . sevelamer carbonate (RENVELA) 800 MG tablet Take 800 mg by mouth 3 (three) times daily. 2 tabs before each meal    . tamsulosin (FLOMAX) 0.4 MG CAPS capsule Take 0.4 mg by mouth daily.    . Testosterone 20.25 MG/ACT (1.62%) GEL APPLY 3 PUMPS DAILY AS DIRECTED 75 g 0  . traZODone (DESYREL) 50 MG tablet Take by mouth.    Marland Kitchen VASCEPA 1 g capsule TAKE 2 CAPSULES BY MOUTH TWICE A DAY 120 capsule 2   No facility-administered medications prior to visit.    Review of Systems  Review of Systems  Constitutional: Negative.   HENT: Negative for postnasal drip, sinus pressure and sinus pain.   Respiratory: Positive for cough. Negative for shortness of breath and wheezing.   Cardiovascular: Negative.    Physical Exam  BP (!) 160/80 (BP Location: Right Arm, Patient Position: Sitting, Cuff Size:  Normal)   Pulse 94   Temp 97.9 F (36.6 C) (Temporal)   Ht 5\' 10"  (1.778 m)   Wt 207 lb 3.2 oz (94 kg)   SpO2 93%   BMI 29.73 kg/m  Physical Exam Constitutional:      General: He is not in acute distress.    Appearance: Normal  appearance. He is ill-appearing.  HENT:     Head: Normocephalic and atraumatic.     Mouth/Throat:     Comments: Deferred d/t masking Cardiovascular:     Rate and Rhythm: Normal rate and regular rhythm.  Pulmonary:     Effort: Pulmonary effort is normal.     Breath sounds: No wheezing.     Comments: Lungs are mostly clear. Hacking/congested cough  Musculoskeletal:        General: Normal range of motion.  Skin:    General: Skin is warm and dry.  Neurological:     General: No focal deficit present.     Mental Status: He is alert and oriented to person, place, and time. Mental status is at baseline.  Psychiatric:        Mood and Affect: Mood normal.        Behavior: Behavior normal.        Thought Content: Thought content normal.        Judgment: Judgment normal.      Lab Results:  CBC    Component Value Date/Time   WBC 6.5 02/09/2020 0825   WBC 13.4 (H) 05/14/2016 0521   RBC 2.42 (LL) 02/09/2020 0825   RBC 2.94 (L) 05/14/2016 0521   HGB 8.2 (LL) 02/09/2020 0825   HGB 12.7 (L) 07/16/2005 0816   HCT 24.6 (L) 02/09/2020 0825   HCT 37.0 (L) 07/16/2005 0816   PLT 193 02/09/2020 0825   MCV 102 (H) 02/09/2020 0825   MCV 80.0 (L) 07/16/2005 0816   MCH 33.9 (H) 02/09/2020 0825   MCH 32.7 05/14/2016 0521   MCHC 33.3 02/09/2020 0825   MCHC 34.4 05/14/2016 0521   RDW 15.6 (H) 02/09/2020 0825   RDW 13.8 07/16/2005 0816   LYMPHSABS 1.9 02/09/2020 0825   LYMPHSABS 3.2 07/16/2005 0816   MONOABS 1.3 (H) 05/10/2016 0746   MONOABS 1.0 (H) 07/16/2005 0816   EOSABS 0.4 02/09/2020 0825   BASOSABS 0.1 02/09/2020 0825   BASOSABS 0.0 07/16/2005 0816    BMET    Component Value Date/Time   NA 133 (L) 05/10/2020 0909   K 5.9 (HH) 05/10/2020 0909   CL  88 (L) 05/10/2020 0909   CO2 21 05/10/2020 0909   GLUCOSE 291 (H) 05/10/2020 0909   GLUCOSE 231 (H) 05/14/2016 0521   BUN 42 (H) 05/10/2020 0909   CREATININE 6.85 (HH) 05/10/2020 0909   CREATININE 2.40 (H) 09/05/2014 1637   CALCIUM 9.4 05/10/2020 0909   CALCIUM 10.1 08/10/2012 1143   GFRNONAA 15 (L) 02/09/2020 0825   GFRNONAA 31 (L) 09/05/2014 1637   GFRAA 17 (L) 02/09/2020 0825   GFRAA 36 (L) 09/05/2014 1637    BNP No results found for: BNP  ProBNP    Component Value Date/Time   PROBNP 94.3 04/16/2012 2220    Imaging: DG Chest 2 View  Result Date: 06/14/2020 CLINICAL DATA:  History of pneumonia and acute bronchitis. EXAM: CHEST - 2 VIEW COMPARISON:  PA and lateral chest 05/17/2020 and 06/09/2020. FINDINGS: The lungs are clear. Heart size is normal. No pneumothorax or pleural fluid. No acute or focal bony abnormality. IMPRESSION: Negative chest. Electronically Signed   By: Inge Rise M.D.   On: 06/14/2020 14:23   DG Chest 2 View  Result Date: 06/09/2020 CLINICAL DATA:  Dyspnea, chronic cough. EXAM: CHEST - 2 VIEW COMPARISON:  May 17, 2020 FINDINGS: Trachea midline. Cardiomediastinal contours and hilar structures are normal. Increased opacification along the LEFT heart border compared to the most  recent prior. No sign of effusion. Upper thoracic compression deformities similar to prior imaging with mild kyphosis. No acute skeletal process on limited assessment. IMPRESSION: Increased opacification along the LEFT heart border compared to the most recent prior, may represent atelectasis or developing infection. Electronically Signed   By: Zetta Bills M.D.   On: 06/09/2020 11:50   DG Chest 2 View  Result Date: 05/18/2020 CLINICAL DATA:  Cough EXAM: CHEST - 2 VIEW COMPARISON:  September 01, 2019 FINDINGS: Lungs are clear. Heart size and pulmonary vascularity are normal. No adenopathy. Anterior wedging of upper thoracic vertebral bodies is stable. IMPRESSION: No edema or airspace  opacity.  Heart size normal. Electronically Signed   By: Lowella Grip III M.D.   On: 05/18/2020 10:18     Assessment & Plan:   Acute bronchitis - Slow to resolve. He continues to have a very harsh and congested cough. Covid testing has been negative. No other significant respiratory symptoms. Denies shortness of breath, PND or sinusitis symptoms.  Repeat CXR today showed clear lungs. Patient received Depomedrol 120mg  IM in office today. Plan prednisone 20mg  x 5 days; 10mg  x 5 days and continue Doxycycline for total 10 days. Recommend he take Mucinex 1200mg  twice daily, use Albuterol nebulizer every 6 hours and given flutter valve for him to use THREE times a day. Hycodan cough syrup 68ml q6 hours for cough suppression when needed. Unable to produce sputum culture d.t cough being non-productive. Advised him to monitor BS if > 300 please notify PCP. Follow-up in 10 days with either TP or BW     Martyn Ehrich, NP 06/14/2020

## 2020-06-14 NOTE — Patient Instructions (Addendum)
Recommendations:  - Continue Doxycycline, extending additional 3 days - Extending prednisone 20mg  x 5 days; 10mg  x 5 days  - Take Mucinex 1200mg  twice daily - Use Albuterol nebulizer every 6 hours - Use flutter valve THREE times a day  - Hycodan cough syrup 65ml q6 hours for cough (do not drive, do not combine with other sedating medication) - Monitor BS if > 300 please notify PCP    Office treatment: - Depomedrol 120mg  IM x1  Follow-up: - 10 days with either TP or BW

## 2020-06-14 NOTE — Telephone Encounter (Signed)
Spoke with the pt and notified of response per TP  He verbalized understanding  Appt with BW at 2:30 today

## 2020-06-15 DIAGNOSIS — D509 Iron deficiency anemia, unspecified: Secondary | ICD-10-CM | POA: Diagnosis not present

## 2020-06-15 DIAGNOSIS — N2581 Secondary hyperparathyroidism of renal origin: Secondary | ICD-10-CM | POA: Diagnosis not present

## 2020-06-15 DIAGNOSIS — E8779 Other fluid overload: Secondary | ICD-10-CM | POA: Diagnosis not present

## 2020-06-15 DIAGNOSIS — D631 Anemia in chronic kidney disease: Secondary | ICD-10-CM | POA: Diagnosis not present

## 2020-06-15 DIAGNOSIS — N186 End stage renal disease: Secondary | ICD-10-CM | POA: Diagnosis not present

## 2020-06-15 NOTE — Telephone Encounter (Signed)
Attempted to call pt but unable to reach. Left message for pt to return call.  When pt returns call, we need to ask pt how he is feeling and if he states that he is doing better since OV with Beth 5/11, his appt with TP on 5/23 can be cancelled due to him having an appt scheduled with Dr. Halford Chessman 6/3.

## 2020-06-16 NOTE — Telephone Encounter (Signed)
Patient returning phone call, name and birth date confirmed. Patient stated he "feels like I'm doing better" but would like to give it a little more time before deciding to cancel appointment with TP on 5/23. Patient realizes he has an appointment with Dr Halford Chessman also on 07/07/20. Patient stated he will call office if he decides to cancel the 5/23 appointment but will leave things the way they are for now. Nothing further needed at this time.

## 2020-06-16 NOTE — Telephone Encounter (Signed)
That is perfectly fine, thanks

## 2020-06-17 DIAGNOSIS — N186 End stage renal disease: Secondary | ICD-10-CM | POA: Diagnosis not present

## 2020-06-17 DIAGNOSIS — N2581 Secondary hyperparathyroidism of renal origin: Secondary | ICD-10-CM | POA: Diagnosis not present

## 2020-06-17 DIAGNOSIS — E8779 Other fluid overload: Secondary | ICD-10-CM | POA: Diagnosis not present

## 2020-06-17 DIAGNOSIS — D509 Iron deficiency anemia, unspecified: Secondary | ICD-10-CM | POA: Diagnosis not present

## 2020-06-17 DIAGNOSIS — D631 Anemia in chronic kidney disease: Secondary | ICD-10-CM | POA: Diagnosis not present

## 2020-06-18 ENCOUNTER — Other Ambulatory Visit: Payer: Self-pay | Admitting: Family Medicine

## 2020-06-19 ENCOUNTER — Other Ambulatory Visit: Payer: Self-pay | Admitting: Family Medicine

## 2020-06-19 ENCOUNTER — Telehealth: Payer: Self-pay | Admitting: Primary Care

## 2020-06-19 DIAGNOSIS — J329 Chronic sinusitis, unspecified: Secondary | ICD-10-CM

## 2020-06-19 DIAGNOSIS — R059 Cough, unspecified: Secondary | ICD-10-CM

## 2020-06-19 DIAGNOSIS — J209 Acute bronchitis, unspecified: Secondary | ICD-10-CM

## 2020-06-19 NOTE — Telephone Encounter (Signed)
Need further imaging studies to assess cause of cough.  Please order CT chest without contrast and CT sinus.

## 2020-06-19 NOTE — Telephone Encounter (Signed)
Called and spoke with patient to let him know of recs from Dr. Halford Chessman. He expressed understanding to getting CT scans done. I advised him that our PCC's would reach out to him. Nothing further needed at this time.

## 2020-06-19 NOTE — Telephone Encounter (Signed)
Called and spoke with pt who states that he is still having complaints of cough. States the cough is still about the same after being prescribed doxycycline and prednisone.  Pt also states that the cough is still a dry cough.  Pt took last dose of the doxycycline yesterday 5/15. States he is now down to taking one 10mg  prednisone and has 5 days left of that.  Pt denies any complaints of fever, has not checked temp today but states he does get his temp checked every Tues, Thurs, Sat at dialysis.  Pt also has some mild wheeze.  Pt has been doing neb tx every 4 hours which he said does help; has not used rescue inhaler any.  Pt has also been using the hycodan cough syrup only taking 1/2 dose of 2.12ml and has been doing this about every 6 hours.  Pt also is using honey cough drops to see if that would help, and is also using flonase nasal spray every morning and atrovent nasal spray every evening while taking xyzal at night.  Due to pt still having complaints with the cough, he wants to know if there is anything at all that can be recommended.  Dr. Halford Chessman, please advise.

## 2020-06-20 DIAGNOSIS — N186 End stage renal disease: Secondary | ICD-10-CM | POA: Diagnosis not present

## 2020-06-20 DIAGNOSIS — D631 Anemia in chronic kidney disease: Secondary | ICD-10-CM | POA: Diagnosis not present

## 2020-06-20 DIAGNOSIS — E8779 Other fluid overload: Secondary | ICD-10-CM | POA: Diagnosis not present

## 2020-06-20 DIAGNOSIS — D509 Iron deficiency anemia, unspecified: Secondary | ICD-10-CM | POA: Diagnosis not present

## 2020-06-20 DIAGNOSIS — N2581 Secondary hyperparathyroidism of renal origin: Secondary | ICD-10-CM | POA: Diagnosis not present

## 2020-06-21 ENCOUNTER — Ambulatory Visit: Payer: Medicare Other | Admitting: Family Medicine

## 2020-06-21 ENCOUNTER — Other Ambulatory Visit: Payer: Self-pay | Admitting: Family Medicine

## 2020-06-21 DIAGNOSIS — E349 Endocrine disorder, unspecified: Secondary | ICD-10-CM

## 2020-06-22 DIAGNOSIS — E8779 Other fluid overload: Secondary | ICD-10-CM | POA: Diagnosis not present

## 2020-06-22 DIAGNOSIS — N2581 Secondary hyperparathyroidism of renal origin: Secondary | ICD-10-CM | POA: Diagnosis not present

## 2020-06-22 DIAGNOSIS — D631 Anemia in chronic kidney disease: Secondary | ICD-10-CM | POA: Diagnosis not present

## 2020-06-22 DIAGNOSIS — N186 End stage renal disease: Secondary | ICD-10-CM | POA: Diagnosis not present

## 2020-06-22 DIAGNOSIS — D509 Iron deficiency anemia, unspecified: Secondary | ICD-10-CM | POA: Diagnosis not present

## 2020-06-23 ENCOUNTER — Ambulatory Visit (INDEPENDENT_AMBULATORY_CARE_PROVIDER_SITE_OTHER)
Admission: RE | Admit: 2020-06-23 | Discharge: 2020-06-23 | Disposition: A | Discharge status: Home / Self Care | Payer: Medicare Other | Source: Ambulatory Visit | Attending: Pulmonary Disease | Admitting: Pulmonary Disease

## 2020-06-23 ENCOUNTER — Other Ambulatory Visit: Payer: Self-pay

## 2020-06-23 DIAGNOSIS — R059 Cough, unspecified: Secondary | ICD-10-CM

## 2020-06-23 DIAGNOSIS — I2510 Atherosclerotic heart disease of native coronary artery without angina pectoris: Secondary | ICD-10-CM | POA: Diagnosis not present

## 2020-06-23 DIAGNOSIS — J9811 Atelectasis: Secondary | ICD-10-CM | POA: Diagnosis not present

## 2020-06-23 DIAGNOSIS — J984 Other disorders of lung: Secondary | ICD-10-CM | POA: Diagnosis not present

## 2020-06-23 DIAGNOSIS — J209 Acute bronchitis, unspecified: Secondary | ICD-10-CM

## 2020-06-23 DIAGNOSIS — J329 Chronic sinusitis, unspecified: Secondary | ICD-10-CM

## 2020-06-24 DIAGNOSIS — N2581 Secondary hyperparathyroidism of renal origin: Secondary | ICD-10-CM | POA: Diagnosis not present

## 2020-06-24 DIAGNOSIS — D631 Anemia in chronic kidney disease: Secondary | ICD-10-CM | POA: Diagnosis not present

## 2020-06-24 DIAGNOSIS — N186 End stage renal disease: Secondary | ICD-10-CM | POA: Diagnosis not present

## 2020-06-24 DIAGNOSIS — D509 Iron deficiency anemia, unspecified: Secondary | ICD-10-CM | POA: Diagnosis not present

## 2020-06-24 DIAGNOSIS — E8779 Other fluid overload: Secondary | ICD-10-CM | POA: Diagnosis not present

## 2020-06-26 ENCOUNTER — Ambulatory Visit: Payer: Medicare Other | Admitting: Adult Health

## 2020-06-27 DIAGNOSIS — N186 End stage renal disease: Secondary | ICD-10-CM | POA: Diagnosis not present

## 2020-06-27 DIAGNOSIS — D509 Iron deficiency anemia, unspecified: Secondary | ICD-10-CM | POA: Diagnosis not present

## 2020-06-27 DIAGNOSIS — N2581 Secondary hyperparathyroidism of renal origin: Secondary | ICD-10-CM | POA: Diagnosis not present

## 2020-06-27 DIAGNOSIS — E8779 Other fluid overload: Secondary | ICD-10-CM | POA: Diagnosis not present

## 2020-06-27 DIAGNOSIS — D631 Anemia in chronic kidney disease: Secondary | ICD-10-CM | POA: Diagnosis not present

## 2020-06-27 NOTE — Progress Notes (Signed)
Called and left message on voicemail to please return phone call to go over CT results. Contact number provided.

## 2020-06-29 DIAGNOSIS — E8779 Other fluid overload: Secondary | ICD-10-CM | POA: Diagnosis not present

## 2020-06-29 DIAGNOSIS — N2581 Secondary hyperparathyroidism of renal origin: Secondary | ICD-10-CM | POA: Diagnosis not present

## 2020-06-29 DIAGNOSIS — D631 Anemia in chronic kidney disease: Secondary | ICD-10-CM | POA: Diagnosis not present

## 2020-06-29 DIAGNOSIS — D509 Iron deficiency anemia, unspecified: Secondary | ICD-10-CM | POA: Diagnosis not present

## 2020-06-29 DIAGNOSIS — N186 End stage renal disease: Secondary | ICD-10-CM | POA: Diagnosis not present

## 2020-06-30 DIAGNOSIS — D510 Vitamin B12 deficiency anemia due to intrinsic factor deficiency: Secondary | ICD-10-CM | POA: Diagnosis not present

## 2020-07-01 DIAGNOSIS — D509 Iron deficiency anemia, unspecified: Secondary | ICD-10-CM | POA: Diagnosis not present

## 2020-07-01 DIAGNOSIS — N2581 Secondary hyperparathyroidism of renal origin: Secondary | ICD-10-CM | POA: Diagnosis not present

## 2020-07-01 DIAGNOSIS — E8779 Other fluid overload: Secondary | ICD-10-CM | POA: Diagnosis not present

## 2020-07-01 DIAGNOSIS — D631 Anemia in chronic kidney disease: Secondary | ICD-10-CM | POA: Diagnosis not present

## 2020-07-01 DIAGNOSIS — N186 End stage renal disease: Secondary | ICD-10-CM | POA: Diagnosis not present

## 2020-07-04 DIAGNOSIS — N2581 Secondary hyperparathyroidism of renal origin: Secondary | ICD-10-CM | POA: Diagnosis not present

## 2020-07-04 DIAGNOSIS — Z992 Dependence on renal dialysis: Secondary | ICD-10-CM | POA: Diagnosis not present

## 2020-07-04 DIAGNOSIS — D509 Iron deficiency anemia, unspecified: Secondary | ICD-10-CM | POA: Diagnosis not present

## 2020-07-04 DIAGNOSIS — E8779 Other fluid overload: Secondary | ICD-10-CM | POA: Diagnosis not present

## 2020-07-04 DIAGNOSIS — D631 Anemia in chronic kidney disease: Secondary | ICD-10-CM | POA: Diagnosis not present

## 2020-07-04 DIAGNOSIS — N186 End stage renal disease: Secondary | ICD-10-CM | POA: Diagnosis not present

## 2020-07-05 DIAGNOSIS — D509 Iron deficiency anemia, unspecified: Secondary | ICD-10-CM | POA: Diagnosis not present

## 2020-07-05 DIAGNOSIS — E8779 Other fluid overload: Secondary | ICD-10-CM | POA: Diagnosis not present

## 2020-07-05 DIAGNOSIS — D631 Anemia in chronic kidney disease: Secondary | ICD-10-CM | POA: Diagnosis not present

## 2020-07-05 DIAGNOSIS — N186 End stage renal disease: Secondary | ICD-10-CM | POA: Diagnosis not present

## 2020-07-05 DIAGNOSIS — N2581 Secondary hyperparathyroidism of renal origin: Secondary | ICD-10-CM | POA: Diagnosis not present

## 2020-07-06 DIAGNOSIS — E8779 Other fluid overload: Secondary | ICD-10-CM | POA: Diagnosis not present

## 2020-07-06 DIAGNOSIS — N2581 Secondary hyperparathyroidism of renal origin: Secondary | ICD-10-CM | POA: Diagnosis not present

## 2020-07-06 DIAGNOSIS — D509 Iron deficiency anemia, unspecified: Secondary | ICD-10-CM | POA: Diagnosis not present

## 2020-07-06 DIAGNOSIS — N186 End stage renal disease: Secondary | ICD-10-CM | POA: Diagnosis not present

## 2020-07-06 DIAGNOSIS — E119 Type 2 diabetes mellitus without complications: Secondary | ICD-10-CM | POA: Diagnosis not present

## 2020-07-06 DIAGNOSIS — D631 Anemia in chronic kidney disease: Secondary | ICD-10-CM | POA: Diagnosis not present

## 2020-07-07 ENCOUNTER — Encounter: Payer: Self-pay | Admitting: Pulmonary Disease

## 2020-07-07 ENCOUNTER — Ambulatory Visit: Payer: Medicare Other | Admitting: Pulmonary Disease

## 2020-07-07 ENCOUNTER — Other Ambulatory Visit: Payer: Self-pay

## 2020-07-07 VITALS — BP 186/92 | HR 83 | Temp 97.9°F | Ht 70.0 in | Wt 206.6 lb

## 2020-07-07 DIAGNOSIS — R053 Chronic cough: Secondary | ICD-10-CM

## 2020-07-07 DIAGNOSIS — G4733 Obstructive sleep apnea (adult) (pediatric): Secondary | ICD-10-CM | POA: Diagnosis not present

## 2020-07-07 DIAGNOSIS — Z9989 Dependence on other enabling machines and devices: Secondary | ICD-10-CM | POA: Diagnosis not present

## 2020-07-07 MED ORDER — FLOVENT HFA 110 MCG/ACT IN AERO: 2.0000 | INHALATION_SPRAY | Freq: Two times a day (BID) | RESPIRATORY_TRACT | 12 refills | Status: DC

## 2020-07-07 NOTE — Progress Notes (Signed)
[facility]  Chief Complaint  Patient presents with  . Follow-up    Worsening nonproductive cough for past 2 weeks    Constitutional:  BP (!) 186/92 (BP Location: Right Arm, Cuff Size: Normal)   Pulse 83   Temp 97.9 F (36.6 C) (Temporal)   Ht 5\' 10"  (1.778 m)   Wt 206 lb 9.6 oz (93.7 kg)   SpO2 98% Comment: RA  BMI 29.64 kg/m   Past Medical History:  Anemia, Pneumonia, ESRD, DJD, Depression, HLD, GERD, Gastroparesis, HIV, Hypothyroidism, DM, Retinopathy, Pancreatic insufficiency  Past Surgical History:  He  has a past surgical history that includes Vitrectomy (bilateral); insulin pump; Colonoscopy; Esophagogastroduodenoscopy; LASIK (Bilateral); Cataract extraction (Right); Video bronchoscopy (Bilateral, 12/15/2012); Eye surgery ([id number]); and Hip Arthroplasty (Right, 05/10/2016).  Brief Summary:  Patrick Brown is a 50 y.o. male with obstructive sleep apnea.      Subjective:   He is here with his mother.  Labs from 06/08/20 showed creatinine 9.20, CO2 15, Na 125.  COVID test negative 06/11/20.  CT chest and sinus were unrevealing for cause of his cough.  Treated with doxycycline and prednisone in May.  Also with tessalon, robitussin, and honey.  Then given depomedrol with extended prednisone and doxycycline.  Also given hycodan.  Cough is much improved, but not completely resolved.  Xyzal works better for his sinuses.  Using albuterol and this helps.  Hasn't got back on CPAP yet.  Using SoClean device.  Physical Exam:   Appearance - well kempt   ENMT - no sinus tenderness, no oral exudate, no LAN, Mallampati 3 airway, no stridor  Respiratory - equal breath sounds bilaterally, no wheezing or rales  CV - s1s2 regular rate and rhythm, no murmurs  Ext - no clubbing, no edema, AV graft Lt arm  Skin - no rashes  Psych - normal mood and affect   Chest imaging:   CT sinus 06/24/20 >> negative  CT chest 06/24/20 >> coronary  calcification, calcified mediastinal and hilar LN, minimal pleuro-parenchymal scarring, basilar ATX, 2 mm nodule LUL  Sleep Tests:   PSG 09/26/14>> AHI 11.3, SpO2 low 86%, PLMI 51.9  Auto CPAP 04/16/20 to 05/15/20 >> used on 29 of 30 nights with average 8 hrs 30 min.  Average AHI 4.6 with median CPAP 9 and 95 th percentile CPAP 12 cm H2O  Social History:  He  reports that he has never smoked. He has never used smokeless tobacco. He reports that he does not drink alcohol and does not use drugs.  Family History:  His family history includes Dementia in some other family members; Diabetes type I in his brother; Prostate cancer in an other family member.     Assessment/Plan:   Obstructive sleep apnea. - he is compliant with CPAP and reports benefit from therapy - he uses [employer] for his DME - he will resume auto CPAP 5 to 15 cm H2O now that his cough has improved - continue auto CPAP 5 to 15 cm H2O  Chronic cough. - likely related to allergic rhinitis with post nasal drip, but might have a mild asthma component as well - will add flovent 110 mcg bid - prn albuterol - continue xyzal, flonase, salt water gargles, and tsp honey  ESRD. - followed by Dr. Loura Back with nephrology at [facility]  Pancreatic insufficiency. - followed by Dr. Shirleen Schirmer with gastroenterology with [facility]  Time Spent Involved in Patient Care on Day of Examination:  34 minutes  Follow up:  Patient Instructions  Flovent two puffs in the morning and two puffs in the evening, and rinse your mouth after each use  Follow up in 8 weeks   Medication List:   Allergies as of 07/07/2020      Reactions   Sulfa Antibiotics Other (See Comments)   High potassium   Ramipril Cough   Versed [midazolam] Other (See Comments)   "I don't wake up very good or clear it out of my system"      Medication List       Accurate as of July 07, 2020  3:53 PM. If you have any questions, ask your nurse or doctor.         STOP taking these medications   benzonatate 200 MG capsule Commonly known as: TESSALON Stopped by: Chesley Mires, MD   HYDROcodone bit-homatropine 5-1.5 MG/5ML syrup Commonly known as: HYCODAN Stopped by: Chesley Mires, MD   predniSONE 10 MG tablet Commonly known as: DELTASONE Stopped by: Chesley Mires, MD     TAKE these medications   acetaminophen 500 MG tablet Commonly known as: TYLENOL Take 1,000 mg by mouth 2 (two) times daily.   acyclovir 400 MG tablet Commonly known as: ZOVIRAX Takes mondays, wednesdays and fridays   AgaMatrix Ultra-Thin Lancets Misc TEST BS 4 TIMES A DAY AND AS NEEDED DX E10.65   albuterol 108 (90 Base) MCG/ACT inhaler Commonly known as: VENTOLIN HFA Inhale 2 puffs into the lungs every 6 (six) hours as needed for wheezing or shortness of breath.   albuterol (2.5 MG/3ML) 0.083% nebulizer solution Commonly known as: PROVENTIL Take 3 mLs (2.5 mg total) by nebulization every 6 (six) hours as needed for wheezing or shortness of breath.   aspirin 81 MG chewable tablet Chew 81 mg by mouth every morning.   cyclobenzaprine 10 MG tablet Commonly known as: FLEXERIL TAKE 1 TABLET BY MOUTH THREE TIMES A DAY AS NEEDED FOR MUSCLE SPASMS   diclofenac sodium 1 % Gel Commonly known as: VOLTAREN Apply 2 g topically 4 (four) times daily.   diphenoxylate-atropine 2.5-0.025 MG tablet Commonly known as: Lomotil Take 1 tablet by mouth 4 (four) times daily as needed for diarrhea or loose stools.   Dovato 50-300 MG Tabs Generic drug: Dolutegravir-lamiVUDine Take 1 tablet by mouth daily.   DULoxetine 60 MG capsule Commonly known as: CYMBALTA TAKE 1 CAPSULE (60 MG TOTAL) BY MOUTH DAILY. TAKE WITH 30 MG FOT A TOTAL OF 90MG    DULoxetine 30 MG capsule Commonly known as: CYMBALTA TAKE 1 CAPSULE (30 MG TOTAL) BY MOUTH DAILY. TAKE WITH THE 60MG  FOR TOTAL OF 90MG    esomeprazole 40 MG capsule Commonly known as: NEXIUM TAKE 1 CAPSULE BY MOUTH EVERY DAY    febuxostat 40 MG tablet Commonly known as: ULORIC TAKE 1 TABLET BY MOUTH EVERY DAY   ferrous sulfate 325 (65 FE) MG tablet Take 650 mg by mouth daily with breakfast.   Flovent HFA 110 MCG/ACT inhaler Generic drug: fluticasone Inhale 2 puffs into the lungs in the morning and at bedtime. Started by: Chesley Mires, MD   fluticasone 50 MCG/ACT nasal spray Commonly known as: FLONASE SPRAY 2 SPRAYS INTO EACH NOSTRIL EVERY DAY   furosemide 40 MG tablet Commonly known as: LASIX Take 40 mg by mouth daily. 40 mg nightly   GLUCOSAMINE 1500 COMPLEX PO Take 1 tablet by mouth 2 (two) times daily.   glucosamine-chondroitin 500-400 MG tablet Take by mouth.   glucose blood test strip Commonly known  as: OneTouch Verio TEST BLOOD SUGAR 4 TIMES DAILY AND AS NEEDED   hydrOXYzine 25 MG tablet Commonly known as: ATARAX/VISTARIL TAKE 1 TABLET BY MOUTH EVERY 8 HOURS AS NEEDED   hyoscyamine 0.125 MG tablet Commonly known as: LEVSIN TAKE 1 TABLET (0.125 MG TOTAL) BY MOUTH EVERY 4 (FOUR) HOURS AS NEEDED.   insulin glargine 100 UNIT/ML injection Commonly known as: LANTUS In the event of insulin pump failure, inject 8 units twice daily   insulin lispro 100 UNIT/ML injection Commonly known as: HumaLOG USE 42 UNITS TO 120 UNITS PER PUMP DAILY AS DIRECTED   ipratropium 0.03 % nasal spray Commonly known as: ATROVENT USE 2 SPRAYS IN EACH NOSTRIL 2-3 TIMES DAILY   levocetirizine 5 MG tablet Commonly known as: XYZAL Take 1 tablet (5 mg total) by mouth every evening.   levothyroxine 175 MCG tablet Commonly known as: SYNTHROID Take 1 tablet (175 mcg total) by mouth daily before breakfast.   lipase/protease/amylase 12000-38000 units Cpep capsule Commonly known as: CREON Take 2 capsules prior to meals and 1 capsule prior to snacks   LORazepam 0.5 MG tablet Commonly known as: ATIVAN Take by mouth.   losartan 50 MG tablet Commonly known as: COZAAR Take by mouth.   meclizine 12.5 MG  tablet Commonly known as: ANTIVERT Take 1 tablet (12.5 mg total) by mouth 3 (three) times daily as needed for dizziness.   metoCLOPramide 5 MG tablet Commonly known as: REGLAN Take 1 tablet (5 mg total) by mouth 3 (three) times daily before meals.   Misc Intestinal Flora Regulat Caps Take 1 capsule by mouth every morning.   niacin 1000 MG CR tablet Commonly known as: NIASPAN TAKE 1 TABLET (1,000 MG TOTAL) BY MOUTH AT BEDTIME.   ondansetron 4 MG tablet Commonly known as: ZOFRAN Take 1 tablet (4 mg total) by mouth every 8 (eight) hours as needed for nausea.   promethazine 25 MG suppository Commonly known as: Phenergan Place 1 suppository (25 mg total) rectally every 6 (six) hours as needed for nausea or vomiting.   rosuvastatin 20 MG tablet Commonly known as: CRESTOR Take 20 mg by mouth at bedtime.   sevelamer carbonate 800 MG tablet Commonly known as: RENVELA Take 800 mg by mouth 3 (three) times daily. 2 tabs before each meal   tamsulosin 0.4 MG Caps capsule Commonly known as: FLOMAX Take 0.4 mg by mouth daily.   Testosterone 20.25 MG/ACT (1.62%) Gel APPLY 3 PUMPS DAILY AS DIRECTED   traZODone 50 MG tablet Commonly known as: DESYREL Take by mouth.   Vascepa 1 g capsule Generic drug: icosapent Ethyl TAKE 2 CAPSULES BY MOUTH TWICE A DAY       Signature:  Chesley Mires, MD [place] Pager - [phone] 07/07/2020, 3:53 PM

## 2020-07-07 NOTE — Patient Instructions (Signed)
Flovent two puffs in the morning and two puffs in the evening, and rinse your mouth after each use  Follow up in 8 weeks

## 2020-07-08 DIAGNOSIS — N186 End stage renal disease: Secondary | ICD-10-CM | POA: Diagnosis not present

## 2020-07-08 DIAGNOSIS — E8779 Other fluid overload: Secondary | ICD-10-CM | POA: Diagnosis not present

## 2020-07-08 DIAGNOSIS — D631 Anemia in chronic kidney disease: Secondary | ICD-10-CM | POA: Diagnosis not present

## 2020-07-08 DIAGNOSIS — D509 Iron deficiency anemia, unspecified: Secondary | ICD-10-CM | POA: Diagnosis not present

## 2020-07-08 DIAGNOSIS — N2581 Secondary hyperparathyroidism of renal origin: Secondary | ICD-10-CM | POA: Diagnosis not present

## 2020-07-11 ENCOUNTER — Encounter: Payer: Self-pay | Admitting: Family Medicine

## 2020-07-11 ENCOUNTER — Ambulatory Visit (INDEPENDENT_AMBULATORY_CARE_PROVIDER_SITE_OTHER): Payer: Medicare Other | Admitting: Family Medicine

## 2020-07-11 DIAGNOSIS — J019 Acute sinusitis, unspecified: Secondary | ICD-10-CM | POA: Diagnosis not present

## 2020-07-11 DIAGNOSIS — D509 Iron deficiency anemia, unspecified: Secondary | ICD-10-CM | POA: Diagnosis not present

## 2020-07-11 DIAGNOSIS — N2581 Secondary hyperparathyroidism of renal origin: Secondary | ICD-10-CM | POA: Diagnosis not present

## 2020-07-11 DIAGNOSIS — B9689 Other specified bacterial agents as the cause of diseases classified elsewhere: Secondary | ICD-10-CM | POA: Diagnosis not present

## 2020-07-11 DIAGNOSIS — D631 Anemia in chronic kidney disease: Secondary | ICD-10-CM | POA: Diagnosis not present

## 2020-07-11 DIAGNOSIS — E8779 Other fluid overload: Secondary | ICD-10-CM | POA: Diagnosis not present

## 2020-07-11 DIAGNOSIS — N186 End stage renal disease: Secondary | ICD-10-CM | POA: Diagnosis not present

## 2020-07-11 MED ORDER — AMOXICILLIN-POT CLAVULANATE 500-125 MG PO TABS
1.0000 | ORAL_TABLET | Freq: Every day | ORAL | 0 refills | Status: AC
Start: 2020-07-11 — End: 2020-07-21

## 2020-07-11 NOTE — Progress Notes (Signed)
Telephone visit  Subjective: CC: URI PCP: Dettinger, Fransisca Kaufmann, MD Patrick Brown is a 50 y.o. male calls for telephone consult today. Patient provides verbal consent for consult held via phone.  Due to COVID-19 pandemic this visit was conducted virtually. This visit type was conducted due to national recommendations for restrictions regarding the COVID-19 Pandemic (e.g. social distancing, sheltering in place) in an effort to limit this patient's exposure and mitigate transmission in our community. All issues noted in this document were discussed and addressed.  A physical exam was not performed with this format.   Location of patient: home Location of provider: [facility] Others present for call: none  1. Sinusitis Patient reports onset of sinus pressure.  Symptoms onset in the past few days.  He reports sinusitis, sinus headache, chronic cough (saw pulmonology recently).  No fevers.  Just started on Flovent and he started having a different cough since that time.  BGs have been elevated into the 400s.  He's had CT and CXR x4 in the last couple of months. Tested for COVID about 2-3 weeks ago and this was negative. Using nasacort daily.  medhx significant ESRD on HD, HIV (levels undetectable).   ROS: Per HPI  Allergies  Allergen Reactions  . Sulfa Antibiotics Other (See Comments)    High potassium  . Ramipril Cough  . Versed [Midazolam] Other (See Comments)    "I don't wake up very good or clear it out of my system"   Past Medical History:  Diagnosis Date  . Anemia, iron deficiency On procrit  . CAP (community acquired pneumonia)   . CKD (chronic kidney disease) stage 3, GFR 30-59 ml/min (HCC)   . Degenerative arthritis   . Depression   . Dyslipidemia   . Gastroesophageal reflux disease   . Gastroparesis diabeticorum ([place])   . Hematuria, microscopic 10/09   work up negative (Dr. Amalia Hailey)  . HIV positive ([place])   . Hyperkalemia, diminished renal excretion 06/2011 secondary to TMP/SMZ;  prior secondary to  ARBS;    Known potassium excretory defect; history of recurrent hyperkalemia due to diabetic renal disease; ACE/ARB contraindicated; hyperkalemia 06/2011 secondary to TMP-SMZ  . Hypothyroidism   . IDDM (insulin dependent diabetes mellitus)    38 years  . Low HDL (under 40)   . Proteinuria   . Retinopathy    x2  . SIRS (systemic inflammatory response syndrome) (HCC)     Current Outpatient Medications:  .  acetaminophen (TYLENOL) 500 MG tablet, Take 1,000 mg by mouth 2 (two) times daily. , Disp: , Rfl:  .  acyclovir (ZOVIRAX) 400 MG tablet, Takes mondays, wednesdays and fridays, Disp: , Rfl:  .  AgaMatrix Ultra-Thin Lancets MISC, TEST BS 4 TIMES A DAY AND AS NEEDED DX E10.65, Disp: 400 each, Rfl: 3 .  albuterol (PROVENTIL) (2.5 MG/3ML) 0.083% nebulizer solution, Take 3 mLs (2.5 mg total) by nebulization every 6 (six) hours as needed for wheezing or shortness of breath., Disp: 75 mL, Rfl: 12 .  albuterol (VENTOLIN HFA) 108 (90 Base) MCG/ACT inhaler, Inhale 2 puffs into the lungs every 6 (six) hours as needed for wheezing or shortness of breath., Disp: 8 g, Rfl: 5 .  aspirin 81 MG chewable tablet, Chew 81 mg by mouth every morning., Disp: , Rfl:  .  cyclobenzaprine (FLEXERIL) 10 MG tablet, TAKE 1 TABLET BY MOUTH THREE TIMES A DAY AS NEEDED FOR MUSCLE SPASMS, Disp: 30 tablet, Rfl: 3 .  diclofenac sodium (VOLTAREN) 1 % GEL, Apply 2 g topically  4 (four) times daily., Disp: 350 g, Rfl: 3 .  diphenoxylate-atropine (LOMOTIL) 2.5-0.025 MG tablet, Take 1 tablet by mouth 4 (four) times daily as needed for diarrhea or loose stools., Disp: 60 tablet, Rfl: 2 .  Dolutegravir-lamiVUDine (DOVATO) 50-300 MG TABS, Take 1 tablet by mouth daily., Disp: 30 tablet, Rfl: 11 .  DULoxetine (CYMBALTA) 30 MG capsule, TAKE 1 CAPSULE (30 MG TOTAL) BY MOUTH DAILY. TAKE WITH THE 60MG  FOR TOTAL OF 90MG , Disp: 90 capsule, Rfl: 0 .  DULoxetine (CYMBALTA) 60 MG capsule, TAKE 1 CAPSULE (60 MG TOTAL) BY MOUTH  DAILY. TAKE WITH 30 MG FOT A TOTAL OF 90MG , Disp: 90 capsule, Rfl: 0 .  esomeprazole (NEXIUM) 40 MG capsule, TAKE 1 CAPSULE BY MOUTH EVERY DAY, Disp: 90 capsule, Rfl: 0 .  febuxostat (ULORIC) 40 MG tablet, TAKE 1 TABLET BY MOUTH EVERY DAY, Disp: 90 tablet, Rfl: 0 .  ferrous sulfate 325 (65 FE) MG tablet, Take 650 mg by mouth daily with breakfast. , Disp: , Rfl:  .  fluticasone (FLONASE) 50 MCG/ACT nasal spray, SPRAY 2 SPRAYS INTO EACH NOSTRIL EVERY DAY, Disp: 48 mL, Rfl: 1 .  fluticasone (FLOVENT HFA) 110 MCG/ACT inhaler, Inhale 2 puffs into the lungs in the morning and at bedtime., Disp: 1 each, Rfl: 12 .  furosemide (LASIX) 40 MG tablet, Take 40 mg by mouth daily. 40 mg nightly, Disp: , Rfl: 4 .  Glucosamine-Chondroit-Vit C-Mn (GLUCOSAMINE 1500 COMPLEX PO), Take 1 tablet by mouth 2 (two) times daily. , Disp: , Rfl:  .  glucosamine-chondroitin 500-400 MG tablet, Take by mouth. , Disp: , Rfl:  .  glucose blood (ONETOUCH VERIO) test strip, TEST BLOOD SUGAR 4 TIMES DAILY AND AS NEEDED, Disp: 300 each, Rfl: 4 .  hydrOXYzine (ATARAX/VISTARIL) 25 MG tablet, TAKE 1 TABLET BY MOUTH EVERY 8 HOURS AS NEEDED, Disp: 270 tablet, Rfl: 0 .  hyoscyamine (LEVSIN) 0.125 MG tablet, TAKE 1 TABLET (0.125 MG TOTAL) BY MOUTH EVERY 4 (FOUR) HOURS AS NEEDED., Disp: 60 tablet, Rfl: 0 .  insulin glargine (LANTUS) 100 UNIT/ML injection, In the event of insulin pump failure, inject 8 units twice daily, Disp: , Rfl:  .  insulin lispro (HUMALOG) 100 UNIT/ML injection, USE 42 UNITS TO 120 UNITS PER PUMP DAILY AS DIRECTED, Disp: 90 mL, Rfl: 3 .  ipratropium (ATROVENT) 0.03 % nasal spray, USE 2 SPRAYS IN EACH NOSTRIL 2-3 TIMES DAILY, Disp: 30 mL, Rfl: 1 .  levocetirizine (XYZAL) 5 MG tablet, Take 1 tablet (5 mg total) by mouth every evening., Disp: 90 tablet, Rfl: 3 .  levothyroxine (SYNTHROID) 175 MCG tablet, Take 1 tablet (175 mcg total) by mouth daily before breakfast., Disp: 90 tablet, Rfl: 1 .  lipase/protease/amylase (CREON)  12000-38000 units CPEP capsule, Take 2 capsules prior to meals and 1 capsule prior to snacks, Disp: , Rfl:  .  LORazepam (ATIVAN) 0.5 MG tablet, Take by mouth., Disp: , Rfl:  .  losartan (COZAAR) 50 MG tablet, Take by mouth., Disp: , Rfl:  .  meclizine (ANTIVERT) 12.5 MG tablet, Take 1 tablet (12.5 mg total) by mouth 3 (three) times daily as needed for dizziness., Disp: 30 tablet, Rfl: 3 .  metoCLOPramide (REGLAN) 5 MG tablet, Take 1 tablet (5 mg total) by mouth 3 (three) times daily before meals., Disp: 270 tablet, Rfl: 3 .  niacin (NIASPAN) 1000 MG CR tablet, TAKE 1 TABLET (1,000 MG TOTAL) BY MOUTH AT BEDTIME., Disp: 30 tablet, Rfl: 5 .  ondansetron (ZOFRAN) 4 MG tablet, Take  1 tablet (4 mg total) by mouth every 8 (eight) hours as needed for nausea., Disp: 45 tablet, Rfl: 6 .  Probiotic Product (MISC INTESTINAL FLORA REGULAT) CAPS, Take 1 capsule by mouth every morning., Disp: , Rfl:  .  promethazine (PHENERGAN) 25 MG suppository, Place 1 suppository (25 mg total) rectally every 6 (six) hours as needed for nausea or vomiting., Disp: 30 each, Rfl: 0 .  rosuvastatin (CRESTOR) 20 MG tablet, Take 20 mg by mouth at bedtime., Disp: , Rfl:  .  sevelamer carbonate (RENVELA) 800 MG tablet, Take 800 mg by mouth 3 (three) times daily. 2 tabs before each meal, Disp: , Rfl:  .  tamsulosin (FLOMAX) 0.4 MG CAPS capsule, Take 0.4 mg by mouth daily., Disp: , Rfl:  .  Testosterone 20.25 MG/ACT (1.62%) GEL, APPLY 3 PUMPS DAILY AS DIRECTED, Disp: 75 g, Rfl: 0 .  traZODone (DESYREL) 50 MG tablet, Take by mouth., Disp: , Rfl:  .  VASCEPA 1 g capsule, TAKE 2 CAPSULES BY MOUTH TWICE A DAY, Disp: 120 capsule, Rfl: 2  Assessment/ Plan: 50 y.o. male   Acute bacterial sinusitis - Plan: amoxicillin-clavulanate (AUGMENTIN) 500-125 MG tablet   Suspect acute bacterial sinusitis.  We discussed consideration for repeat COVID testing.  He wishes to hold off on this for now.  Advised that if no significant improvement within the  next 48 hours would like him to be retested and potentially obtain a new x-ray if needed at that point.  It sounds like his HIV has been under good control so doubt any atypical infections at this point.  Will CC to his providers just as an [place].  Augmentin was renally dosed at 500 mg daily.  Continue symptomatic management with home therapies.  Follow-up as needed   Start time: 12:48p End time: 12:55pm  Total time spent on patient care (including telephone call/ virtual visit): 7 minutes  [place], [place] [phone]

## 2020-07-11 NOTE — Patient Instructions (Signed)

## 2020-07-13 DIAGNOSIS — N2581 Secondary hyperparathyroidism of renal origin: Secondary | ICD-10-CM | POA: Diagnosis not present

## 2020-07-13 DIAGNOSIS — D509 Iron deficiency anemia, unspecified: Secondary | ICD-10-CM | POA: Diagnosis not present

## 2020-07-13 DIAGNOSIS — D631 Anemia in chronic kidney disease: Secondary | ICD-10-CM | POA: Diagnosis not present

## 2020-07-13 DIAGNOSIS — N186 End stage renal disease: Secondary | ICD-10-CM | POA: Diagnosis not present

## 2020-07-13 DIAGNOSIS — E8779 Other fluid overload: Secondary | ICD-10-CM | POA: Diagnosis not present

## 2020-07-15 DIAGNOSIS — E8779 Other fluid overload: Secondary | ICD-10-CM | POA: Diagnosis not present

## 2020-07-15 DIAGNOSIS — N186 End stage renal disease: Secondary | ICD-10-CM | POA: Diagnosis not present

## 2020-07-15 DIAGNOSIS — D509 Iron deficiency anemia, unspecified: Secondary | ICD-10-CM | POA: Diagnosis not present

## 2020-07-15 DIAGNOSIS — D631 Anemia in chronic kidney disease: Secondary | ICD-10-CM | POA: Diagnosis not present

## 2020-07-15 DIAGNOSIS — N2581 Secondary hyperparathyroidism of renal origin: Secondary | ICD-10-CM | POA: Diagnosis not present

## 2020-07-18 DIAGNOSIS — E8779 Other fluid overload: Secondary | ICD-10-CM | POA: Diagnosis not present

## 2020-07-18 DIAGNOSIS — N186 End stage renal disease: Secondary | ICD-10-CM | POA: Diagnosis not present

## 2020-07-18 DIAGNOSIS — D631 Anemia in chronic kidney disease: Secondary | ICD-10-CM | POA: Diagnosis not present

## 2020-07-18 DIAGNOSIS — D509 Iron deficiency anemia, unspecified: Secondary | ICD-10-CM | POA: Diagnosis not present

## 2020-07-18 DIAGNOSIS — N2581 Secondary hyperparathyroidism of renal origin: Secondary | ICD-10-CM | POA: Diagnosis not present

## 2020-07-20 ENCOUNTER — Other Ambulatory Visit: Payer: Self-pay | Admitting: Family Medicine

## 2020-07-20 DIAGNOSIS — E8779 Other fluid overload: Secondary | ICD-10-CM | POA: Diagnosis not present

## 2020-07-20 DIAGNOSIS — N186 End stage renal disease: Secondary | ICD-10-CM | POA: Diagnosis not present

## 2020-07-20 DIAGNOSIS — D631 Anemia in chronic kidney disease: Secondary | ICD-10-CM | POA: Diagnosis not present

## 2020-07-20 DIAGNOSIS — D509 Iron deficiency anemia, unspecified: Secondary | ICD-10-CM | POA: Diagnosis not present

## 2020-07-20 DIAGNOSIS — N2581 Secondary hyperparathyroidism of renal origin: Secondary | ICD-10-CM | POA: Diagnosis not present

## 2020-07-20 DIAGNOSIS — E349 Endocrine disorder, unspecified: Secondary | ICD-10-CM

## 2020-07-20 NOTE — Telephone Encounter (Signed)
Dr Dettinger,   This pt was seen 05/10/20 for chronic follow up  Refills were not given at the [facility],   One month was called in 06/22/20 and now is needing another.  Next appt is scheduled for 08/14/20. Can he have one more refill?

## 2020-07-22 DIAGNOSIS — N186 End stage renal disease: Secondary | ICD-10-CM | POA: Diagnosis not present

## 2020-07-22 DIAGNOSIS — E8779 Other fluid overload: Secondary | ICD-10-CM | POA: Diagnosis not present

## 2020-07-22 DIAGNOSIS — N2581 Secondary hyperparathyroidism of renal origin: Secondary | ICD-10-CM | POA: Diagnosis not present

## 2020-07-22 DIAGNOSIS — D509 Iron deficiency anemia, unspecified: Secondary | ICD-10-CM | POA: Diagnosis not present

## 2020-07-22 DIAGNOSIS — D631 Anemia in chronic kidney disease: Secondary | ICD-10-CM | POA: Diagnosis not present

## 2020-07-25 DIAGNOSIS — N2581 Secondary hyperparathyroidism of renal origin: Secondary | ICD-10-CM | POA: Diagnosis not present

## 2020-07-25 DIAGNOSIS — D631 Anemia in chronic kidney disease: Secondary | ICD-10-CM | POA: Diagnosis not present

## 2020-07-25 DIAGNOSIS — N186 End stage renal disease: Secondary | ICD-10-CM | POA: Diagnosis not present

## 2020-07-25 DIAGNOSIS — D509 Iron deficiency anemia, unspecified: Secondary | ICD-10-CM | POA: Diagnosis not present

## 2020-07-25 DIAGNOSIS — E8779 Other fluid overload: Secondary | ICD-10-CM | POA: Diagnosis not present

## 2020-07-27 DIAGNOSIS — N186 End stage renal disease: Secondary | ICD-10-CM | POA: Diagnosis not present

## 2020-07-27 DIAGNOSIS — D509 Iron deficiency anemia, unspecified: Secondary | ICD-10-CM | POA: Diagnosis not present

## 2020-07-27 DIAGNOSIS — N2581 Secondary hyperparathyroidism of renal origin: Secondary | ICD-10-CM | POA: Diagnosis not present

## 2020-07-27 DIAGNOSIS — D631 Anemia in chronic kidney disease: Secondary | ICD-10-CM | POA: Diagnosis not present

## 2020-07-27 DIAGNOSIS — E8779 Other fluid overload: Secondary | ICD-10-CM | POA: Diagnosis not present

## 2020-07-28 DIAGNOSIS — D510 Vitamin B12 deficiency anemia due to intrinsic factor deficiency: Secondary | ICD-10-CM | POA: Diagnosis not present

## 2020-07-29 DIAGNOSIS — D509 Iron deficiency anemia, unspecified: Secondary | ICD-10-CM | POA: Diagnosis not present

## 2020-07-29 DIAGNOSIS — D631 Anemia in chronic kidney disease: Secondary | ICD-10-CM | POA: Diagnosis not present

## 2020-07-29 DIAGNOSIS — E8779 Other fluid overload: Secondary | ICD-10-CM | POA: Diagnosis not present

## 2020-07-29 DIAGNOSIS — N186 End stage renal disease: Secondary | ICD-10-CM | POA: Diagnosis not present

## 2020-07-29 DIAGNOSIS — N2581 Secondary hyperparathyroidism of renal origin: Secondary | ICD-10-CM | POA: Diagnosis not present

## 2020-07-31 ENCOUNTER — Other Ambulatory Visit: Payer: Self-pay | Admitting: Family Medicine

## 2020-07-31 ENCOUNTER — Ambulatory Visit (INDEPENDENT_AMBULATORY_CARE_PROVIDER_SITE_OTHER): Payer: Medicare Other | Admitting: Ophthalmology

## 2020-07-31 ENCOUNTER — Other Ambulatory Visit: Payer: Self-pay

## 2020-07-31 ENCOUNTER — Encounter (INDEPENDENT_AMBULATORY_CARE_PROVIDER_SITE_OTHER): Payer: Self-pay | Admitting: Ophthalmology

## 2020-07-31 DIAGNOSIS — E103512 Type 1 diabetes mellitus with proliferative diabetic retinopathy with macular edema, left eye: Secondary | ICD-10-CM | POA: Diagnosis not present

## 2020-07-31 DIAGNOSIS — E103552 Type 1 diabetes mellitus with stable proliferative diabetic retinopathy, left eye: Secondary | ICD-10-CM | POA: Diagnosis not present

## 2020-07-31 DIAGNOSIS — H35372 Puckering of macula, left eye: Secondary | ICD-10-CM | POA: Insufficient documentation

## 2020-07-31 DIAGNOSIS — E103551 Type 1 diabetes mellitus with stable proliferative diabetic retinopathy, right eye: Secondary | ICD-10-CM | POA: Diagnosis not present

## 2020-07-31 DIAGNOSIS — E103511 Type 1 diabetes mellitus with proliferative diabetic retinopathy with macular edema, right eye: Secondary | ICD-10-CM | POA: Diagnosis not present

## 2020-07-31 DIAGNOSIS — H179 Unspecified corneal scar and opacity: Secondary | ICD-10-CM | POA: Diagnosis not present

## 2020-07-31 DIAGNOSIS — G4733 Obstructive sleep apnea (adult) (pediatric): Secondary | ICD-10-CM | POA: Diagnosis not present

## 2020-07-31 NOTE — Progress Notes (Signed)
07/31/2020     CHIEF COMPLAINT Patient presents for Retina Follow Up (9 months fu OU and OCT/Pt states, "My vision sometimes gets blurry but I have recently had some issues with BS and BP being a little out of control but they are both back under control."/Pt states, "I have started dialysis just this last year."/A1C: 7.2/LBS: 103)   HISTORY OF PRESENT ILLNESS: Patrick Brown is a 50 y.o. male who presents to the clinic today for:   HPI     Retina Follow Up           Diagnosis: Diabetic Retinopathy   Laterality: both eyes   Onset: 9 months ago   Severity: mild   Duration: 9 months   Course: stable   Comments: 9 months fu OU and OCT Pt states, "My vision sometimes gets blurry but I have recently had some issues with BS and BP being a little out of control but they are both back under control." Pt states, "I have started dialysis just this last year." A1C: 7.2 LBS: 103       Last edited by Kendra Opitz, COA on 07/31/2020  8:18 AM.      Referring physician: Dettinger, Fransisca Kaufmann, MD [place],  [place] [address]  HISTORICAL INFORMATION:   Selected notes from the [facility]    Lab Results  Component Value Date   HGBA1C 6.7 10/08/2019     CURRENT MEDICATIONS: No current outpatient medications on file. (Ophthalmic Drugs)   No current facility-administered medications for this visit. (Ophthalmic Drugs)   Current Outpatient Medications (Other)  Medication Sig   acetaminophen (TYLENOL) 500 MG tablet Take 1,000 mg by mouth 2 (two) times daily.    acyclovir (ZOVIRAX) 400 MG tablet Takes mondays, wednesdays and fridays   AgaMatrix Ultra-Thin Lancets MISC TEST BS 4 TIMES A DAY AND AS NEEDED DX E10.65   albuterol (PROVENTIL) (2.5 MG/3ML) 0.083% nebulizer solution Take 3 mLs (2.5 mg total) by nebulization every 6 (six) hours as needed for wheezing or shortness of breath.   albuterol (VENTOLIN HFA) 108 (90 Base) MCG/ACT inhaler Inhale 2 puffs into the lungs  every 6 (six) hours as needed for wheezing or shortness of breath.   aspirin 81 MG chewable tablet Chew 81 mg by mouth every morning.   cyclobenzaprine (FLEXERIL) 10 MG tablet TAKE 1 TABLET BY MOUTH THREE TIMES A DAY AS NEEDED FOR MUSCLE SPASMS   diclofenac sodium (VOLTAREN) 1 % GEL Apply 2 g topically 4 (four) times daily.   diphenoxylate-atropine (LOMOTIL) 2.5-0.025 MG tablet Take 1 tablet by mouth 4 (four) times daily as needed for diarrhea or loose stools.   Dolutegravir-lamiVUDine (DOVATO) 50-300 MG TABS Take 1 tablet by mouth daily.   DULoxetine (CYMBALTA) 30 MG capsule TAKE 1 CAPSULE (30 MG TOTAL) BY MOUTH DAILY. TAKE WITH THE 60MG  FOR TOTAL OF 90MG    DULoxetine (CYMBALTA) 60 MG capsule TAKE 1 CAPSULE (60 MG TOTAL) BY MOUTH DAILY. TAKE WITH 30 MG FOT A TOTAL OF 90MG    esomeprazole (NEXIUM) 40 MG capsule TAKE 1 CAPSULE BY MOUTH EVERY DAY   febuxostat (ULORIC) 40 MG tablet TAKE 1 TABLET BY MOUTH EVERY DAY   ferrous sulfate 325 (65 FE) MG tablet Take 650 mg by mouth daily with breakfast.    fluticasone (FLONASE) 50 MCG/ACT nasal spray SPRAY 2 SPRAYS INTO EACH NOSTRIL EVERY DAY   fluticasone (FLOVENT HFA) 110 MCG/ACT inhaler Inhale 2 puffs into the lungs in the morning and  at bedtime.   furosemide (LASIX) 40 MG tablet Take 40 mg by mouth daily. 40 mg nightly   Glucosamine-Chondroit-Vit C-Mn (GLUCOSAMINE 1500 COMPLEX PO) Take 1 tablet by mouth 2 (two) times daily.    glucosamine-chondroitin 500-400 MG tablet Take by mouth.    glucose blood (ONETOUCH VERIO) test strip TEST BLOOD SUGAR 4 TIMES DAILY AND AS NEEDED   hydrOXYzine (ATARAX/VISTARIL) 25 MG tablet TAKE 1 TABLET BY MOUTH EVERY 8 HOURS AS NEEDED   hyoscyamine (LEVSIN) 0.125 MG tablet TAKE 1 TABLET (0.125 MG TOTAL) BY MOUTH EVERY 4 (FOUR) HOURS AS NEEDED.   insulin glargine (LANTUS) 100 UNIT/ML injection In the event of insulin pump failure, inject 8 units twice daily   insulin lispro (HUMALOG) 100 UNIT/ML injection USE 42 UNITS TO 120  UNITS PER PUMP DAILY AS DIRECTED   ipratropium (ATROVENT) 0.03 % nasal spray USE 2 SPRAYS IN EACH NOSTRIL 2-3 TIMES DAILY   levocetirizine (XYZAL) 5 MG tablet Take 1 tablet (5 mg total) by mouth every evening.   levothyroxine (SYNTHROID) 175 MCG tablet Take 1 tablet (175 mcg total) by mouth daily before breakfast.   lipase/protease/amylase (CREON) 12000-38000 units CPEP capsule Take 2 capsules prior to meals and 1 capsule prior to snacks   LORazepam (ATIVAN) 0.5 MG tablet Take by mouth.   losartan (COZAAR) 50 MG tablet Take by mouth.   meclizine (ANTIVERT) 12.5 MG tablet Take 1 tablet (12.5 mg total) by mouth 3 (three) times daily as needed for dizziness.   metoCLOPramide (REGLAN) 5 MG tablet Take 1 tablet (5 mg total) by mouth 3 (three) times daily before meals.   niacin (NIASPAN) 1000 MG CR tablet TAKE 1 TABLET (1,000 MG TOTAL) BY MOUTH AT BEDTIME.   ondansetron (ZOFRAN) 4 MG tablet Take 1 tablet (4 mg total) by mouth every 8 (eight) hours as needed for nausea.   Probiotic Product (MISC INTESTINAL FLORA REGULAT) CAPS Take 1 capsule by mouth every morning.   promethazine (PHENERGAN) 25 MG suppository Place 1 suppository (25 mg total) rectally every 6 (six) hours as needed for nausea or vomiting.   rosuvastatin (CRESTOR) 20 MG tablet Take 20 mg by mouth at bedtime.   sevelamer carbonate (RENVELA) 800 MG tablet Take 800 mg by mouth 3 (three) times daily. 2 tabs before each meal   tamsulosin (FLOMAX) 0.4 MG CAPS capsule Take 0.4 mg by mouth daily.   Testosterone 20.25 MG/ACT (1.62%) GEL APPLY 3 PUMPS DAILY AS DIRECTED   traZODone (DESYREL) 50 MG tablet Take by mouth.   VASCEPA 1 g capsule TAKE 2 CAPSULES BY MOUTH TWICE A DAY   No current facility-administered medications for this visit. (Other)      REVIEW OF SYSTEMS:    ALLERGIES Allergies  Allergen Reactions   Sulfa Antibiotics Other (See Comments)    High potassium   Ramipril Cough   Versed [Midazolam] Other (See Comments)    "I  don't wake up very good or clear it out of my system"    PAST MEDICAL HISTORY Past Medical History:  Diagnosis Date   Anemia, iron deficiency On procrit   CAP (community acquired pneumonia)    CKD (chronic kidney disease) stage 3, GFR 30-59 ml/min (HCC)    Degenerative arthritis    Depression    Dyslipidemia    Gastroesophageal reflux disease    Gastroparesis diabeticorum ([place])    Hematuria, microscopic 10/09   work up negative (Dr. Amalia Hailey)   HIV positive ([place])    Hyperkalemia, diminished renal excretion 06/2011  secondary to TMP/SMZ; prior secondary to  ARBS;    Known potassium excretory defect; history of recurrent hyperkalemia due to diabetic renal disease; ACE/ARB contraindicated; hyperkalemia 06/2011 secondary to TMP-SMZ   Hypothyroidism    IDDM (insulin dependent diabetes mellitus)    38 years   Low HDL (under 40)    Proteinuria    Retinopathy    x2   SIRS (systemic inflammatory response syndrome) ([place])    Past Surgical History:  Procedure Laterality Date   CATARACT EXTRACTION Right    COLONOSCOPY     ESOPHAGOGASTRODUODENOSCOPY     EYE SURGERY  2006,2001   x2    HIP ARTHROPLASTY Right 05/10/2016   Procedure: RIGHT HIP HEMIARTHROPLASTY;  Surgeon: Marchia Bond, MD;  Location: [facility];  Service: Orthopedics;  Laterality: Right;   insulin pump     LASIK Bilateral    VIDEO BRONCHOSCOPY Bilateral 12/15/2012   Procedure: VIDEO BRONCHOSCOPY WITH FLUORO;  Surgeon: Kathee Delton, MD;  Location: [facility];  Service: Cardiopulmonary;  Laterality: Bilateral;   VITRECTOMY  bilateral    FAMILY HISTORY Family History  Problem Relation Age of Onset   Diabetes type I Brother    Prostate cancer Other    Dementia Other    Dementia Other    Dementia Other     SOCIAL HISTORY Social History   Tobacco Use   Smoking status: Never   Smokeless tobacco: Never  Vaping Use   Vaping Use: Never used  Substance Use Topics   Alcohol use: No    Alcohol/week: 0.0 standard drinks     Comment: Infrequent, less than 1 x per month.   Drug use: No         OPHTHALMIC EXAM:  Base Eye Exam     Visual Acuity (ETDRS)       Right Left   Dist cc 20/80 -1 20/25 +2   Dist ph cc 20/60          Tonometry (Tonopen, 8:22 AM)       Right Left   Pressure 13 14         Pupils       Pupils Dark Light Shape React APD   Right PERRL 4 3 Round Brisk None   Left PERRL 4 3 Round Brisk None         Visual Fields (Counting fingers)       Left Right    Full Full         Extraocular Movement       Right Left    Full Full         Neuro/Psych     Oriented x3: Yes   Mood/Affect: Normal         Dilation     Both eyes: 1.0% Mydriacyl, 2.5% Phenylephrine @ 8:22 AM           Slit Lamp and Fundus Exam     External Exam       Right Left   External Normal Normal         Slit Lamp Exam       Right Left   Lids/Lashes Normal Normal   Conjunctiva/Sclera White and quiet White and quiet   Cornea Opacity: Central, Scar Clear   Anterior Chamber Deep and quiet Deep and quiet   Iris Round and reactive Round and reactive   Lens Centered posterior chamber intraocular lens Centered posterior chamber intraocular lens   Anterior Vitreous Normal Normal  Fundus Exam       Right Left   Posterior Vitreous Clear, vitrectomized Clear, vitrectomized   Disc Normal Normal   C/D Ratio 0.3 0.3   Macula no macular thickening, Microaneurysms no macular thickening, Microaneurysms   Vessels PDR-quiet PDR-quiet   Periphery Good PRP Good PRP            IMAGING AND PROCEDURES  Imaging and Procedures for 07/31/20  OCT, Retina - OU - Both Eyes       Right Eye Quality was borderline. Scan locations included subfoveal. Progression has been stable. Findings include no SRF, no IRF, abnormal foveal contour.   Left Eye Findings include epiretinal membrane.   Notes No active maculopathy right eye  OS no active maculopathy and only minor epiretinal  membrane temporal aspect of the fovea with no significant impact on acuity will observe             ASSESSMENT/PLAN:  Left epiretinal membrane No therapy required OS  Stable treated proliferative diabetic retinopathy of left eye without macular edema determined by examination associated with type 1 diabetes mellitus ([place]) The nature of regressed proliferative diabetic retinopathy was discussed with the patient. The patient was advised to maintain good glucose, blood pressure, monitor kidney function and serum lipid control as advised by personal physician. Rare risk for reactivation of progression exist with untreated severe anemia, untreated renal failure, untreated heart failure, and smoking. Complete avoidance of smoking was recommended. The chance of recurrent proliferative diabetic retinopathy was discussed as well as the chance of vitreous hemorrhage for which further treatments may be necessary.   Explained to the patient that the quiescent  proliferative diabetic retinopathy disease is unlikely to ever worsen.  Worsening factors would include however severe anemia, hypertension out-of-control or impending renal failure.  Stable treated proliferative diabetic retinopathy of right eye determined by examination associated with type 1 diabetes mellitus ([place]) PDR regressed OD     ICD-10-CM   1. Stable treated proliferative diabetic retinopathy of right eye with macular edema determined by examination associated with type 1 diabetes mellitus ([place])  E10.3551 OCT, Retina - OU - Both Eyes   E10.3511     2. Stable treated proliferative diabetic retinopathy of left eye with macular edema determined by examination associated with type 1 diabetes mellitus (HCC)  E10.3552 OCT, Retina - OU - Both Eyes   E10.3512     3. Right corneal scar with opacity  H17.9     4. Left epiretinal membrane  H35.372     5. Stable treated proliferative diabetic retinopathy of left eye without macular edema  determined by examination associated with type 1 diabetes mellitus ([place])  D98.3382     6. Stable treated proliferative diabetic retinopathy of right eye determined by examination associated with type 1 diabetes mellitus ([place])  E10.3551       1.  No active diabetic retinopathy in either eye.  Quiescent disease.  2.  OS with minor none distorting epiretinal membrane seen only on high-definition OCT will observe  3.  Corneal scar centrally OD followed by corneal specialist at [facility] [place] Ordered this visit:  No orders of the defined types were placed in this encounter.      Return in about 9 months (around 04/30/2021) for DILATE OU, COLOR FP, OCT.  There are no Patient Instructions on file for this visit.   Explained the diagnoses, plan, and follow up with the patient and they expressed understanding.  Patient  expressed understanding of the importance of proper follow up care.   Clent Demark Rockie Vawter M.D. Diseases & Surgery of the Retina and Vitreous Retina & Diabetic [place] 07/31/20     Abbreviations: M myopia (nearsighted); A astigmatism; H hyperopia (farsighted); P presbyopia; Mrx spectacle prescription;  CTL contact lenses; OD right eye; OS left eye; OU both eyes  XT exotropia; ET esotropia; PEK punctate epithelial keratitis; PEE punctate epithelial erosions; DES dry eye syndrome; MGD meibomian gland dysfunction; ATs artificial tears; PFAT's preservative free artificial tears; [place] nuclear sclerotic cataract; PSC posterior subcapsular cataract; ERM epi-retinal membrane; PVD posterior vitreous detachment; RD retinal detachment; DM diabetes mellitus; DR diabetic retinopathy; NPDR non-proliferative diabetic retinopathy; PDR proliferative diabetic retinopathy; CSME clinically significant macular edema; DME diabetic macular edema; dbh dot blot hemorrhages; CWS cotton wool spot; POAG primary open angle glaucoma; C/D cup-to-disc ratio; HVF humphrey visual  field; GVF goldmann visual field; OCT optical coherence tomography; IOP intraocular pressure; BRVO Branch retinal vein occlusion; CRVO central retinal vein occlusion; CRAO central retinal artery occlusion; BRAO branch retinal artery occlusion; RT retinal tear; SB scleral buckle; PPV pars plana vitrectomy; VH Vitreous hemorrhage; PRP panretinal laser photocoagulation; IVK intravitreal kenalog; VMT vitreomacular traction; MH Macular hole;  NVD neovascularization of the disc; NVE neovascularization elsewhere; AREDS age related eye disease study; ARMD age related macular degeneration; POAG primary open angle glaucoma; EBMD epithelial/anterior basement membrane dystrophy; ACIOL anterior chamber intraocular lens; IOL intraocular lens; PCIOL posterior chamber intraocular lens; Phaco/IOL phacoemulsification with intraocular lens placement; [place] photorefractive keratectomy; LASIK laser assisted in situ keratomileusis; HTN hypertension; DM diabetes mellitus; COPD chronic obstructive pulmonary disease

## 2020-07-31 NOTE — Assessment & Plan Note (Signed)
No therapy required OS 

## 2020-07-31 NOTE — Assessment & Plan Note (Signed)

## 2020-07-31 NOTE — Assessment & Plan Note (Signed)
PDR regressed OD

## 2020-08-01 ENCOUNTER — Encounter (INDEPENDENT_AMBULATORY_CARE_PROVIDER_SITE_OTHER): Payer: Medicare Other | Admitting: Ophthalmology

## 2020-08-01 DIAGNOSIS — N2581 Secondary hyperparathyroidism of renal origin: Secondary | ICD-10-CM | POA: Diagnosis not present

## 2020-08-01 DIAGNOSIS — D631 Anemia in chronic kidney disease: Secondary | ICD-10-CM | POA: Diagnosis not present

## 2020-08-01 DIAGNOSIS — E8779 Other fluid overload: Secondary | ICD-10-CM | POA: Diagnosis not present

## 2020-08-01 DIAGNOSIS — N186 End stage renal disease: Secondary | ICD-10-CM | POA: Diagnosis not present

## 2020-08-01 DIAGNOSIS — D509 Iron deficiency anemia, unspecified: Secondary | ICD-10-CM | POA: Diagnosis not present

## 2020-08-03 DIAGNOSIS — D631 Anemia in chronic kidney disease: Secondary | ICD-10-CM | POA: Diagnosis not present

## 2020-08-03 DIAGNOSIS — N2581 Secondary hyperparathyroidism of renal origin: Secondary | ICD-10-CM | POA: Diagnosis not present

## 2020-08-03 DIAGNOSIS — E8779 Other fluid overload: Secondary | ICD-10-CM | POA: Diagnosis not present

## 2020-08-03 DIAGNOSIS — Z992 Dependence on renal dialysis: Secondary | ICD-10-CM | POA: Diagnosis not present

## 2020-08-03 DIAGNOSIS — D509 Iron deficiency anemia, unspecified: Secondary | ICD-10-CM | POA: Diagnosis not present

## 2020-08-03 DIAGNOSIS — N186 End stage renal disease: Secondary | ICD-10-CM | POA: Diagnosis not present

## 2020-08-04 ENCOUNTER — Other Ambulatory Visit: Payer: Self-pay | Admitting: Family Medicine

## 2020-08-05 DIAGNOSIS — E8779 Other fluid overload: Secondary | ICD-10-CM | POA: Diagnosis not present

## 2020-08-05 DIAGNOSIS — D631 Anemia in chronic kidney disease: Secondary | ICD-10-CM | POA: Diagnosis not present

## 2020-08-05 DIAGNOSIS — N2581 Secondary hyperparathyroidism of renal origin: Secondary | ICD-10-CM | POA: Diagnosis not present

## 2020-08-05 DIAGNOSIS — N186 End stage renal disease: Secondary | ICD-10-CM | POA: Diagnosis not present

## 2020-08-05 DIAGNOSIS — D509 Iron deficiency anemia, unspecified: Secondary | ICD-10-CM | POA: Diagnosis not present

## 2020-08-08 DIAGNOSIS — E8779 Other fluid overload: Secondary | ICD-10-CM | POA: Diagnosis not present

## 2020-08-08 DIAGNOSIS — N2581 Secondary hyperparathyroidism of renal origin: Secondary | ICD-10-CM | POA: Diagnosis not present

## 2020-08-08 DIAGNOSIS — D631 Anemia in chronic kidney disease: Secondary | ICD-10-CM | POA: Diagnosis not present

## 2020-08-08 DIAGNOSIS — N186 End stage renal disease: Secondary | ICD-10-CM | POA: Diagnosis not present

## 2020-08-08 DIAGNOSIS — D509 Iron deficiency anemia, unspecified: Secondary | ICD-10-CM | POA: Diagnosis not present

## 2020-08-10 ENCOUNTER — Other Ambulatory Visit: Payer: Self-pay | Admitting: Family Medicine

## 2020-08-10 DIAGNOSIS — E8779 Other fluid overload: Secondary | ICD-10-CM | POA: Diagnosis not present

## 2020-08-10 DIAGNOSIS — D631 Anemia in chronic kidney disease: Secondary | ICD-10-CM | POA: Diagnosis not present

## 2020-08-10 DIAGNOSIS — N186 End stage renal disease: Secondary | ICD-10-CM | POA: Diagnosis not present

## 2020-08-10 DIAGNOSIS — D509 Iron deficiency anemia, unspecified: Secondary | ICD-10-CM | POA: Diagnosis not present

## 2020-08-10 DIAGNOSIS — E119 Type 2 diabetes mellitus without complications: Secondary | ICD-10-CM | POA: Diagnosis not present

## 2020-08-10 DIAGNOSIS — N2581 Secondary hyperparathyroidism of renal origin: Secondary | ICD-10-CM | POA: Diagnosis not present

## 2020-08-12 ENCOUNTER — Other Ambulatory Visit: Payer: Self-pay | Admitting: Family Medicine

## 2020-08-12 DIAGNOSIS — D509 Iron deficiency anemia, unspecified: Secondary | ICD-10-CM | POA: Diagnosis not present

## 2020-08-12 DIAGNOSIS — E349 Endocrine disorder, unspecified: Secondary | ICD-10-CM

## 2020-08-12 DIAGNOSIS — N186 End stage renal disease: Secondary | ICD-10-CM | POA: Diagnosis not present

## 2020-08-12 DIAGNOSIS — E8779 Other fluid overload: Secondary | ICD-10-CM | POA: Diagnosis not present

## 2020-08-12 DIAGNOSIS — N2581 Secondary hyperparathyroidism of renal origin: Secondary | ICD-10-CM | POA: Diagnosis not present

## 2020-08-12 DIAGNOSIS — D631 Anemia in chronic kidney disease: Secondary | ICD-10-CM | POA: Diagnosis not present

## 2020-08-14 ENCOUNTER — Encounter: Payer: Self-pay | Admitting: Family Medicine

## 2020-08-14 ENCOUNTER — Ambulatory Visit (INDEPENDENT_AMBULATORY_CARE_PROVIDER_SITE_OTHER): Payer: Medicare Other | Admitting: Family Medicine

## 2020-08-14 ENCOUNTER — Other Ambulatory Visit: Payer: Self-pay

## 2020-08-14 VITALS — BP 164/81 | HR 86 | Ht 70.0 in | Wt 208.0 lb

## 2020-08-14 DIAGNOSIS — E103551 Type 1 diabetes mellitus with stable proliferative diabetic retinopathy, right eye: Secondary | ICD-10-CM

## 2020-08-14 DIAGNOSIS — E039 Hypothyroidism, unspecified: Secondary | ICD-10-CM | POA: Diagnosis not present

## 2020-08-14 DIAGNOSIS — Z992 Dependence on renal dialysis: Secondary | ICD-10-CM | POA: Diagnosis not present

## 2020-08-14 DIAGNOSIS — E1021 Type 1 diabetes mellitus with diabetic nephropathy: Secondary | ICD-10-CM

## 2020-08-14 DIAGNOSIS — N1832 Chronic kidney disease, stage 3b: Secondary | ICD-10-CM

## 2020-08-14 DIAGNOSIS — N186 End stage renal disease: Secondary | ICD-10-CM | POA: Diagnosis not present

## 2020-08-14 DIAGNOSIS — E1022 Type 1 diabetes mellitus with diabetic chronic kidney disease: Secondary | ICD-10-CM

## 2020-08-14 LAB — BAYER DCA HB A1C WAIVED: HB A1C (BAYER DCA - WAIVED): 4.7 % (ref <7.0)

## 2020-08-14 MED ORDER — TRAMADOL HCL 50 MG PO TABS: 50.0000 mg | ORAL_TABLET | Freq: Every day | ORAL | 0 refills | Status: DC | PRN

## 2020-08-14 NOTE — Progress Notes (Signed)
BP (!) 164/81   Pulse 86   Ht $R'5\' 10"'JU$  (1.778 m)   Wt 208 lb (94.3 kg)   SpO2 98%   BMI 29.84 kg/m    Subjective:   Patient ID: Patrick Brown, male    DOB: 10-22-1970, 50 y.o.   MRN: [id number]  HPI: Seamus Warehime is a 50 y.o. male presenting on 08/14/2020 for Medical Management of Chronic Issues, Diabetes, and Chronic Kidney Disease   HPI Hypertension Patient is currently on amlodipine and losartan, just restarted them a week ago., and their blood pressure today is 158/84. Patient denies any lightheadedness or dizziness. Patient denies  blurred vision, chest pains, shortness of breath, or weakness. Denies any side effects from medication and is content with current medication. Patient gets occasional headaches and uses the occasional tramadol for it, he has had a prescription for 5 years for the tramadol.  Type 1 diabetes mellitus Patient comes in today for recheck of his diabetes. Patient has been currently taking insulin pump, he has been getting some lows lately, A1c is 4.7, will discuss this with his endocrinologist.. Patient is currently on an ACE inhibitor/ARB. Patient has seen an ophthalmologist this year. Patient denies any new issues with their feet. The symptom started onset as an adult neuropathy and end-stage renal disease and retinopathy ARE RELATED TO DM   Hypothyroidism recheck Patient is coming in for thyroid recheck today as well. They deny any issues with hair changes or heat or cold problems or diarrhea or constipation. They deny any chest pain or palpitations. They are currently on levothyroxine 175 micrograms   Hyperlipidemia Patient is coming in for recheck of his hyperlipidemia. The patient is currently taking Crestor. They deny any issues with myalgias or history of liver damage from it. They deny any focal numbness or weakness or chest pain.   Relevant past medical, surgical, family and social history reviewed and updated as indicated. Interim medical history  since our last visit reviewed. Allergies and medications reviewed and updated.  Review of Systems  Constitutional:  Negative for chills and fever.  Eyes:  Negative for visual disturbance.  Respiratory:  Negative for shortness of breath and wheezing.   Cardiovascular:  Negative for chest pain and leg swelling.  Musculoskeletal:  Negative for back pain and gait problem.  Skin:  Negative for rash.  Neurological:  Positive for headaches. Negative for dizziness and weakness.  All other systems reviewed and are negative.  Per HPI unless specifically indicated above   Allergies as of 08/14/2020       Reactions   Sulfa Antibiotics Other (See Comments)   High potassium   Ramipril Cough   Versed [midazolam] Other (See Comments)   "I don't wake up very good or clear it out of my system"        Medication List        Accurate as of August 14, 2020  8:50 AM. If you have any questions, ask your nurse or doctor.          acetaminophen 500 MG tablet Commonly known as: TYLENOL Take 1,000 mg by mouth 2 (two) times daily.   acyclovir 400 MG tablet Commonly known as: ZOVIRAX Takes mondays, wednesdays and fridays   AgaMatrix Ultra-Thin Lancets Misc TEST BS 4 TIMES A DAY AND AS NEEDED DX E10.65   albuterol 108 (90 Base) MCG/ACT inhaler Commonly known as: VENTOLIN HFA Inhale 2 puffs into the lungs every 6 (six) hours as needed for wheezing or shortness of breath.  albuterol (2.5 MG/3ML) 0.083% nebulizer solution Commonly known as: PROVENTIL Take 3 mLs (2.5 mg total) by nebulization every 6 (six) hours as needed for wheezing or shortness of breath.   amLODipine 10 MG tablet Commonly known as: NORVASC Take 10 mg by mouth at bedtime as needed.   aspirin 81 MG chewable tablet Chew 81 mg by mouth every morning.   cyclobenzaprine 10 MG tablet Commonly known as: FLEXERIL TAKE 1 TABLET BY MOUTH THREE TIMES A DAY AS NEEDED FOR MUSCLE SPASMS   diclofenac sodium 1 % Gel Commonly  known as: VOLTAREN Apply 2 g topically 4 (four) times daily.   diphenoxylate-atropine 2.5-0.025 MG tablet Commonly known as: Lomotil Take 1 tablet by mouth 4 (four) times daily as needed for diarrhea or loose stools.   Dovato 50-300 MG Tabs Generic drug: Dolutegravir-lamiVUDine Take 1 tablet by mouth daily.   DULoxetine 60 MG capsule Commonly known as: CYMBALTA TAKE 1 CAPSULE (60 MG TOTAL) BY MOUTH DAILY. TAKE WITH 30 MG FOT A TOTAL OF $Remove'90MG'znLaSxf$    DULoxetine 30 MG capsule Commonly known as: CYMBALTA TAKE 1 CAPSULE (30 MG TOTAL) BY MOUTH DAILY. TAKE WITH THE $Remove'60MG'eTyqspc$  FOR TOTAL OF $Remove'90MG'hGSQvpZ$    esomeprazole 40 MG capsule Commonly known as: NEXIUM TAKE 1 CAPSULE BY MOUTH EVERY DAY   febuxostat 40 MG tablet Commonly known as: ULORIC TAKE 1 TABLET BY MOUTH EVERY DAY   ferrous sulfate 325 (65 FE) MG tablet Take 650 mg by mouth daily with breakfast.   Flovent HFA 110 MCG/ACT inhaler Generic drug: fluticasone Inhale 2 puffs into the lungs in the morning and at bedtime.   fluticasone 50 MCG/ACT nasal spray Commonly known as: FLONASE SPRAY 2 SPRAYS INTO EACH NOSTRIL EVERY DAY   furosemide 40 MG tablet Commonly known as: LASIX Take 40 mg by mouth daily. 40 mg nightly   GLUCOSAMINE 1500 COMPLEX PO Take 1 tablet by mouth 2 (two) times daily.   glucosamine-chondroitin 500-400 MG tablet Take by mouth.   glucose blood test strip Commonly known as: OneTouch Verio TEST BLOOD SUGAR 4 TIMES DAILY AND AS NEEDED   hydrOXYzine 25 MG tablet Commonly known as: ATARAX/VISTARIL TAKE 1 TABLET BY MOUTH EVERY 8 HOURS AS NEEDED   hyoscyamine 0.125 MG tablet Commonly known as: LEVSIN TAKE 1 TABLET (0.125 MG TOTAL) BY MOUTH EVERY 4 (FOUR) HOURS AS NEEDED.   icosapent Ethyl 1 g capsule Commonly known as: VASCEPA TAKE 2 CAPSULES BY MOUTH TWICE A DAY   insulin glargine 100 UNIT/ML injection Commonly known as: LANTUS In the event of insulin pump failure, inject 8 units twice daily   insulin lispro 100  UNIT/ML injection Commonly known as: HumaLOG USE 42 UNITS TO 120 UNITS PER PUMP DAILY AS DIRECTED   ipratropium 0.03 % nasal spray Commonly known as: ATROVENT USE 2 SPRAYS IN EACH NOSTRIL 2-3 TIMES DAILY   levocetirizine 5 MG tablet Commonly known as: XYZAL Take 1 tablet (5 mg total) by mouth every evening.   levothyroxine 175 MCG tablet Commonly known as: SYNTHROID TAKE 1 TABLET BY MOUTH DAILY BEFORE BREAKFAST.   lipase/protease/amylase 12000-38000 units Cpep capsule Commonly known as: CREON Take 2 capsules prior to meals and 1 capsule prior to snacks   LORazepam 0.5 MG tablet Commonly known as: ATIVAN Take by mouth.   losartan 50 MG tablet Commonly known as: COZAAR Take by mouth.   meclizine 12.5 MG tablet Commonly known as: ANTIVERT Take 1 tablet (12.5 mg total) by mouth 3 (three) times daily as needed for dizziness.  metoCLOPramide 5 MG tablet Commonly known as: REGLAN Take 1 tablet (5 mg total) by mouth 3 (three) times daily before meals.   Misc Intestinal Flora Regulat Caps Take 1 capsule by mouth every morning.   niacin 1000 MG CR tablet Commonly known as: NIASPAN TAKE 1 TABLET (1,000 MG TOTAL) BY MOUTH AT BEDTIME.   ondansetron 4 MG tablet Commonly known as: ZOFRAN Take 1 tablet (4 mg total) by mouth every 8 (eight) hours as needed for nausea.   promethazine 25 MG suppository Commonly known as: Phenergan Place 1 suppository (25 mg total) rectally every 6 (six) hours as needed for nausea or vomiting.   rosuvastatin 20 MG tablet Commonly known as: CRESTOR Take 20 mg by mouth at bedtime.   sevelamer carbonate 800 MG tablet Commonly known as: RENVELA Take 800 mg by mouth 3 (three) times daily. 2 tabs before each meal   tamsulosin 0.4 MG Caps capsule Commonly known as: FLOMAX Take 0.4 mg by mouth daily.   Testosterone 20.25 MG/ACT (1.62%) Gel APPLY 3 PUMPS DAILY AS DIRECTED   traZODone 50 MG tablet Commonly known as: DESYREL Take by mouth.          Objective:   BP (!) 164/81   Pulse 86   Ht $R'5\' 10"'Oi$  (1.778 m)   Wt 208 lb (94.3 kg)   SpO2 98%   BMI 29.84 kg/m   Wt Readings from Last 3 Encounters:  08/14/20 208 lb (94.3 kg)  07/07/20 206 lb 9.6 oz (93.7 kg)  06/14/20 207 lb 3.2 oz (94 kg)    Physical Exam Vitals and nursing note reviewed.  Constitutional:      General: He is not in acute distress.    Appearance: He is well-developed. He is not diaphoretic.  Eyes:     General: No scleral icterus.    Conjunctiva/sclera: Conjunctivae normal.  Neck:     Thyroid: No thyromegaly.  Cardiovascular:     Rate and Rhythm: Normal rate and regular rhythm.     Heart sounds: Normal heart sounds. No murmur heard. Pulmonary:     Effort: Pulmonary effort is normal. No respiratory distress.     Breath sounds: Normal breath sounds. No wheezing.  Musculoskeletal:        General: Swelling (Trace lower extremity edema bilaterally) present. Normal range of motion.     Cervical back: Neck supple.  Lymphadenopathy:     Cervical: No cervical adenopathy.  Skin:    General: Skin is warm and dry.     Findings: No rash.  Neurological:     Mental Status: He is alert and oriented to person, place, and time.     Coordination: Coordination normal.  Psychiatric:        Behavior: Behavior normal.      Assessment & Plan:   Problem List Items Addressed This Visit       Endocrine   Type 1 diabetes mellitus ([place]) - Primary   Relevant Orders   BMP8+EGFR   TSH   Bayer DCA Hb A1c Waived   Stable treated proliferative diabetic retinopathy of right eye determined by examination associated with type 1 diabetes mellitus ([place])   Acquired hypothyroidism   Relevant Orders   TSH     Genitourinary   ESRD on hemodialysis ([place])    No adjustments on BP today because he just did a week ago and is following up with his nephrologist for this.  He will call them if it continues to run elevated.  This is  lower than it was running.  Patient  having some low blood sugars recommend he call up his endocrinologist well.  Will check thyroid levels.  Gave tramadol, this is not something we will do consistently but uses it very infrequently so given 1 prescription for it. Follow up plan: No follow-ups on file.  Counseling provided for all of the vaccine components Orders Placed This Encounter  Procedures   BMP8+EGFR   TSH   Bayer DCA Hb A1c [place], MD [facility] 08/14/2020, 8:50 AM

## 2020-08-15 DIAGNOSIS — N186 End stage renal disease: Secondary | ICD-10-CM | POA: Diagnosis not present

## 2020-08-15 DIAGNOSIS — D631 Anemia in chronic kidney disease: Secondary | ICD-10-CM | POA: Diagnosis not present

## 2020-08-15 DIAGNOSIS — D509 Iron deficiency anemia, unspecified: Secondary | ICD-10-CM | POA: Diagnosis not present

## 2020-08-15 DIAGNOSIS — E8779 Other fluid overload: Secondary | ICD-10-CM | POA: Diagnosis not present

## 2020-08-15 DIAGNOSIS — N2581 Secondary hyperparathyroidism of renal origin: Secondary | ICD-10-CM | POA: Diagnosis not present

## 2020-08-15 LAB — BMP8+EGFR
BUN/Creatinine Ratio: 6 — ABNORMAL LOW (ref 9–20)
BUN: 40 mg/dL — ABNORMAL HIGH (ref 6–24)
CO2: 21 mmol/L (ref 20–29)
Calcium: 8.9 mg/dL (ref 8.7–10.2)
Chloride: 94 mmol/L — ABNORMAL LOW (ref 96–106)
Creatinine, Ser: 6.8 mg/dL (ref 0.76–1.27)
Glucose: 254 mg/dL — ABNORMAL HIGH (ref 65–99)
Potassium: 4.3 mmol/L (ref 3.5–5.2)
Sodium: 134 mmol/L (ref 134–144)
eGFR: 9 mL/min/{1.73_m2} — ABNORMAL LOW (ref >59)

## 2020-08-15 LAB — TSH: TSH: 6.12 u[IU]/mL — ABNORMAL HIGH (ref 0.450–4.500)

## 2020-08-16 ENCOUNTER — Encounter: Payer: Self-pay | Admitting: Pulmonary Disease

## 2020-08-16 ENCOUNTER — Other Ambulatory Visit: Payer: Self-pay

## 2020-08-16 ENCOUNTER — Ambulatory Visit: Payer: Medicare Other | Admitting: Pulmonary Disease

## 2020-08-16 VITALS — BP 150/70 | HR 85 | Temp 97.9°F | Ht 70.0 in | Wt 207.4 lb

## 2020-08-16 DIAGNOSIS — G4733 Obstructive sleep apnea (adult) (pediatric): Secondary | ICD-10-CM

## 2020-08-16 DIAGNOSIS — J4540 Moderate persistent asthma, uncomplicated: Secondary | ICD-10-CM | POA: Diagnosis not present

## 2020-08-16 DIAGNOSIS — Z9989 Dependence on other enabling machines and devices: Secondary | ICD-10-CM

## 2020-08-16 DIAGNOSIS — R053 Chronic cough: Secondary | ICD-10-CM

## 2020-08-16 DIAGNOSIS — J301 Allergic rhinitis due to pollen: Secondary | ICD-10-CM

## 2020-08-16 MED ORDER — LEVOTHYROXINE SODIUM 200 MCG PO TABS: 200.0000 ug | ORAL_TABLET | Freq: Every day | ORAL | 1 refills | Status: DC

## 2020-08-16 NOTE — Progress Notes (Signed)
[facility]  Chief Complaint  Patient presents with   Follow-up    Wears CPAP 8-9 hrs. Each night. Cough is better, Flovent working good    Constitutional:  BP (!) 150/70 (BP Location: Right Arm, Cuff Size: Normal)   Pulse 85   Temp 97.9 F (36.6 C) (Temporal)   Ht 5\' 10"  (1.778 m)   Wt 207 lb 6.4 oz (94.1 kg)   SpO2 98%   BMI 29.76 kg/m   Past Medical History:  Anemia, Pneumonia, ESRD, DJD, Depression, HLD, GERD, Gastroparesis, HIV, Hypothyroidism, DM, Retinopathy, Pancreatic insufficiency  Past Surgical History:  He  has a past surgical history that includes Vitrectomy (bilateral); insulin pump; Colonoscopy; Esophagogastroduodenoscopy; LASIK (Bilateral); Cataract extraction (Right); Video bronchoscopy (Bilateral, 12/15/2012); Eye surgery ([id number]); and Hip Arthroplasty (Right, 05/10/2016).  Brief Summary:  Patrick Brown is a 50 y.o. male with obstructive sleep apnea.      Subjective:   He is here with his mother.  Cough is much better.  He feels like he is back to his baseline.  His cough is dry and sporadic.  Flovent has helped.  No issues with sinuses.  Using CPAP at night, and no issues with breathing while asleep.   Physical Exam:   Appearance - well kempt   ENMT - no sinus tenderness, no oral exudate, no LAN, Mallampati 3 airway, no stridor  Respiratory - equal breath sounds bilaterally, no wheezing or rales  CV - s1s2 regular rate and rhythm, no murmurs  Ext - no clubbing, no edema  Skin - no rashes  Psych - normal mood and affect   Chest imaging:  CT sinus 06/24/20 >> negative CT chest 06/24/20 >> coronary calcification, calcified mediastinal and hilar LN, minimal pleuro-parenchymal scarring, basilar ATX, 2 mm nodule LUL  Sleep Tests:  PSG 09/26/14 >> AHI 11.3, SpO2 low 86%, PLMI 51.9 Auto CPAP 04/16/20 to 05/15/20 >> used on 29 of 30 nights with average 8 hrs 30 min.  Average AHI 4.6 with median CPAP 9 and 95  th percentile CPAP 12 cm H2O  Social History:  He  reports that he has never smoked. He has never used smokeless tobacco. He reports that he does not drink alcohol and does not use drugs.  Family History:  His family history includes Dementia in some other family members; Diabetes type I in his brother; Prostate cancer in an other family member.     Assessment/Plan:   Obstructive sleep apnea. - he is compliant with CPAP and reports benefit from therapy - he uses [employer] for his DME - continue auto CPAP 5 to 15 cm H2O  Chronic cough. - from allergic rhinitis with post nasal drip and moderate persistent asthma - improved - continue flovent 110 mcg bid - prn albuterol - continue xyzal, flonase - re-assess status in Fall and if stable then consider stepping down asthma regimen  ESRD. - followed by Dr. Loura Back with nephrology at [facility]  Pancreatic insufficiency. - followed by Dr. Shirleen Schirmer with gastroenterology with [facility]  Time Spent Involved in Patient Care on Day of Examination:  22 minutes  Follow up:   Patient Instructions  Follow up in 2 months  Medication List:   Allergies as of 08/16/2020       Reactions   Sulfa Antibiotics Other (See Comments)   High potassium   Ramipril Cough   Versed [midazolam] Other (See Comments)   "I don't wake up very good or  clear it out of my system"        Medication List        Accurate as of August 16, 2020 10:57 AM. If you have any questions, ask your nurse or doctor.          acetaminophen 500 MG tablet Commonly known as: TYLENOL Take 1,000 mg by mouth 2 (two) times daily.   acyclovir 400 MG tablet Commonly known as: ZOVIRAX Takes mondays, wednesdays and fridays   AgaMatrix Ultra-Thin Lancets Misc TEST BS 4 TIMES A DAY AND AS NEEDED DX E10.65   albuterol 108 (90 Base) MCG/ACT inhaler Commonly known as: VENTOLIN HFA Inhale 2 puffs into the lungs every 6 (six) hours as needed for wheezing or  shortness of breath.   albuterol (2.5 MG/3ML) 0.083% nebulizer solution Commonly known as: PROVENTIL Take 3 mLs (2.5 mg total) by nebulization every 6 (six) hours as needed for wheezing or shortness of breath.   amLODipine 10 MG tablet Commonly known as: NORVASC Take 10 mg by mouth at bedtime as needed.   aspirin 81 MG chewable tablet Chew 81 mg by mouth every morning.   cyclobenzaprine 10 MG tablet Commonly known as: FLEXERIL TAKE 1 TABLET BY MOUTH THREE TIMES A DAY AS NEEDED FOR MUSCLE SPASMS   diclofenac sodium 1 % Gel Commonly known as: VOLTAREN Apply 2 g topically 4 (four) times daily.   diphenoxylate-atropine 2.5-0.025 MG tablet Commonly known as: Lomotil Take 1 tablet by mouth 4 (four) times daily as needed for diarrhea or loose stools.   Dovato 50-300 MG Tabs Generic drug: Dolutegravir-lamiVUDine Take 1 tablet by mouth daily.   DULoxetine 60 MG capsule Commonly known as: CYMBALTA TAKE 1 CAPSULE (60 MG TOTAL) BY MOUTH DAILY. TAKE WITH 30 MG FOT A TOTAL OF 90MG    DULoxetine 30 MG capsule Commonly known as: CYMBALTA TAKE 1 CAPSULE (30 MG TOTAL) BY MOUTH DAILY. TAKE WITH THE 60MG  FOR TOTAL OF 90MG    esomeprazole 40 MG capsule Commonly known as: NEXIUM TAKE 1 CAPSULE BY MOUTH EVERY DAY   febuxostat 40 MG tablet Commonly known as: ULORIC TAKE 1 TABLET BY MOUTH EVERY DAY   ferrous sulfate 325 (65 FE) MG tablet Take 650 mg by mouth daily with breakfast.   Flovent HFA 110 MCG/ACT inhaler Generic drug: fluticasone Inhale 2 puffs into the lungs in the morning and at bedtime.   fluticasone 50 MCG/ACT nasal spray Commonly known as: FLONASE SPRAY 2 SPRAYS INTO EACH NOSTRIL EVERY DAY   furosemide 40 MG tablet Commonly known as: LASIX Take 40 mg by mouth daily. 40 mg nightly   GLUCOSAMINE 1500 COMPLEX PO Take 1 tablet by mouth 2 (two) times daily.   glucosamine-chondroitin 500-400 MG tablet Take by mouth.   glucose blood test strip Commonly known as:  OneTouch Verio TEST BLOOD SUGAR 4 TIMES DAILY AND AS NEEDED   hydrOXYzine 25 MG tablet Commonly known as: ATARAX/VISTARIL TAKE 1 TABLET BY MOUTH EVERY 8 HOURS AS NEEDED   hyoscyamine 0.125 MG tablet Commonly known as: LEVSIN TAKE 1 TABLET (0.125 MG TOTAL) BY MOUTH EVERY 4 (FOUR) HOURS AS NEEDED.   icosapent Ethyl 1 g capsule Commonly known as: VASCEPA TAKE 2 CAPSULES BY MOUTH TWICE A DAY   insulin glargine 100 UNIT/ML injection Commonly known as: LANTUS In the event of insulin pump failure, inject 8 units twice daily   insulin lispro 100 UNIT/ML injection Commonly known as: HumaLOG USE 42 UNITS TO 120 UNITS PER PUMP DAILY AS DIRECTED  ipratropium 0.03 % nasal spray Commonly known as: ATROVENT USE 2 SPRAYS IN EACH NOSTRIL 2-3 TIMES DAILY   levocetirizine 5 MG tablet Commonly known as: XYZAL Take 1 tablet (5 mg total) by mouth every evening.   levothyroxine 175 MCG tablet Commonly known as: SYNTHROID TAKE 1 TABLET BY MOUTH DAILY BEFORE BREAKFAST.   lipase/protease/amylase 12000-38000 units Cpep capsule Commonly known as: CREON Take 2 capsules prior to meals and 1 capsule prior to snacks   LORazepam 0.5 MG tablet Commonly known as: ATIVAN Take by mouth.   losartan 100 MG tablet Commonly known as: COZAAR Take 100 mg by mouth daily.   losartan 50 MG tablet Commonly known as: COZAAR Take by mouth.   meclizine 12.5 MG tablet Commonly known as: ANTIVERT Take 1 tablet (12.5 mg total) by mouth 3 (three) times daily as needed for dizziness.   metoCLOPramide 5 MG tablet Commonly known as: REGLAN Take 1 tablet (5 mg total) by mouth 3 (three) times daily before meals.   Misc Intestinal Flora Regulat Caps Take 1 capsule by mouth every morning.   niacin 1000 MG CR tablet Commonly known as: NIASPAN TAKE 1 TABLET (1,000 MG TOTAL) BY MOUTH AT BEDTIME.   ondansetron 4 MG tablet Commonly known as: ZOFRAN Take 1 tablet (4 mg total) by mouth every 8 (eight) hours as  needed for nausea.   promethazine 25 MG suppository Commonly known as: Phenergan Place 1 suppository (25 mg total) rectally every 6 (six) hours as needed for nausea or vomiting.   rosuvastatin 20 MG tablet Commonly known as: CRESTOR Take 20 mg by mouth at bedtime.   sevelamer carbonate 800 MG tablet Commonly known as: RENVELA Take 800 mg by mouth 3 (three) times daily. 2 tabs before each meal   tamsulosin 0.4 MG Caps capsule Commonly known as: FLOMAX Take 0.4 mg by mouth daily.   Testosterone 20.25 MG/ACT (1.62%) Gel APPLY 3 PUMPS DAILY AS DIRECTED   traMADol 50 MG tablet Commonly known as: ULTRAM Take 1 tablet (50 mg total) by mouth daily as needed.   traZODone 50 MG tablet Commonly known as: DESYREL Take by mouth.        Signature:  Chesley Mires, MD [place] Pager - [phone] 08/16/2020, 10:57 AM

## 2020-08-16 NOTE — Patient Instructions (Signed)
Follow up in 2 months

## 2020-08-17 DIAGNOSIS — D509 Iron deficiency anemia, unspecified: Secondary | ICD-10-CM | POA: Diagnosis not present

## 2020-08-17 DIAGNOSIS — E8779 Other fluid overload: Secondary | ICD-10-CM | POA: Diagnosis not present

## 2020-08-17 DIAGNOSIS — N186 End stage renal disease: Secondary | ICD-10-CM | POA: Diagnosis not present

## 2020-08-17 DIAGNOSIS — D631 Anemia in chronic kidney disease: Secondary | ICD-10-CM | POA: Diagnosis not present

## 2020-08-17 DIAGNOSIS — N2581 Secondary hyperparathyroidism of renal origin: Secondary | ICD-10-CM | POA: Diagnosis not present

## 2020-08-19 DIAGNOSIS — E8779 Other fluid overload: Secondary | ICD-10-CM | POA: Diagnosis not present

## 2020-08-19 DIAGNOSIS — N186 End stage renal disease: Secondary | ICD-10-CM | POA: Diagnosis not present

## 2020-08-19 DIAGNOSIS — N2581 Secondary hyperparathyroidism of renal origin: Secondary | ICD-10-CM | POA: Diagnosis not present

## 2020-08-19 DIAGNOSIS — D509 Iron deficiency anemia, unspecified: Secondary | ICD-10-CM | POA: Diagnosis not present

## 2020-08-19 DIAGNOSIS — D631 Anemia in chronic kidney disease: Secondary | ICD-10-CM | POA: Diagnosis not present

## 2020-08-21 ENCOUNTER — Other Ambulatory Visit: Payer: Self-pay | Admitting: Family Medicine

## 2020-08-22 DIAGNOSIS — N186 End stage renal disease: Secondary | ICD-10-CM | POA: Diagnosis not present

## 2020-08-22 DIAGNOSIS — N2581 Secondary hyperparathyroidism of renal origin: Secondary | ICD-10-CM | POA: Diagnosis not present

## 2020-08-22 DIAGNOSIS — D631 Anemia in chronic kidney disease: Secondary | ICD-10-CM | POA: Diagnosis not present

## 2020-08-22 DIAGNOSIS — E8779 Other fluid overload: Secondary | ICD-10-CM | POA: Diagnosis not present

## 2020-08-22 DIAGNOSIS — D509 Iron deficiency anemia, unspecified: Secondary | ICD-10-CM | POA: Diagnosis not present

## 2020-08-24 DIAGNOSIS — N186 End stage renal disease: Secondary | ICD-10-CM | POA: Diagnosis not present

## 2020-08-24 DIAGNOSIS — D509 Iron deficiency anemia, unspecified: Secondary | ICD-10-CM | POA: Diagnosis not present

## 2020-08-24 DIAGNOSIS — N2581 Secondary hyperparathyroidism of renal origin: Secondary | ICD-10-CM | POA: Diagnosis not present

## 2020-08-24 DIAGNOSIS — D631 Anemia in chronic kidney disease: Secondary | ICD-10-CM | POA: Diagnosis not present

## 2020-08-24 DIAGNOSIS — E8779 Other fluid overload: Secondary | ICD-10-CM | POA: Diagnosis not present

## 2020-08-25 DIAGNOSIS — D510 Vitamin B12 deficiency anemia due to intrinsic factor deficiency: Secondary | ICD-10-CM | POA: Diagnosis not present

## 2020-08-26 ENCOUNTER — Other Ambulatory Visit: Payer: Self-pay | Admitting: Family Medicine

## 2020-08-26 DIAGNOSIS — D509 Iron deficiency anemia, unspecified: Secondary | ICD-10-CM | POA: Diagnosis not present

## 2020-08-26 DIAGNOSIS — N2581 Secondary hyperparathyroidism of renal origin: Secondary | ICD-10-CM | POA: Diagnosis not present

## 2020-08-26 DIAGNOSIS — N186 End stage renal disease: Secondary | ICD-10-CM | POA: Diagnosis not present

## 2020-08-26 DIAGNOSIS — D631 Anemia in chronic kidney disease: Secondary | ICD-10-CM | POA: Diagnosis not present

## 2020-08-26 DIAGNOSIS — E8779 Other fluid overload: Secondary | ICD-10-CM | POA: Diagnosis not present

## 2020-08-29 DIAGNOSIS — D631 Anemia in chronic kidney disease: Secondary | ICD-10-CM | POA: Diagnosis not present

## 2020-08-29 DIAGNOSIS — N2581 Secondary hyperparathyroidism of renal origin: Secondary | ICD-10-CM | POA: Diagnosis not present

## 2020-08-29 DIAGNOSIS — N186 End stage renal disease: Secondary | ICD-10-CM | POA: Diagnosis not present

## 2020-08-29 DIAGNOSIS — D509 Iron deficiency anemia, unspecified: Secondary | ICD-10-CM | POA: Diagnosis not present

## 2020-08-29 DIAGNOSIS — E8779 Other fluid overload: Secondary | ICD-10-CM | POA: Diagnosis not present

## 2020-08-31 ENCOUNTER — Other Ambulatory Visit: Payer: Self-pay | Admitting: Family Medicine

## 2020-08-31 DIAGNOSIS — D509 Iron deficiency anemia, unspecified: Secondary | ICD-10-CM | POA: Diagnosis not present

## 2020-08-31 DIAGNOSIS — D631 Anemia in chronic kidney disease: Secondary | ICD-10-CM | POA: Diagnosis not present

## 2020-08-31 DIAGNOSIS — E8779 Other fluid overload: Secondary | ICD-10-CM | POA: Diagnosis not present

## 2020-08-31 DIAGNOSIS — N186 End stage renal disease: Secondary | ICD-10-CM | POA: Diagnosis not present

## 2020-08-31 DIAGNOSIS — Z1159 Encounter for screening for other viral diseases: Secondary | ICD-10-CM | POA: Diagnosis not present

## 2020-08-31 DIAGNOSIS — N2581 Secondary hyperparathyroidism of renal origin: Secondary | ICD-10-CM | POA: Diagnosis not present

## 2020-09-02 DIAGNOSIS — N2581 Secondary hyperparathyroidism of renal origin: Secondary | ICD-10-CM | POA: Diagnosis not present

## 2020-09-02 DIAGNOSIS — D509 Iron deficiency anemia, unspecified: Secondary | ICD-10-CM | POA: Diagnosis not present

## 2020-09-02 DIAGNOSIS — E8779 Other fluid overload: Secondary | ICD-10-CM | POA: Diagnosis not present

## 2020-09-02 DIAGNOSIS — D631 Anemia in chronic kidney disease: Secondary | ICD-10-CM | POA: Diagnosis not present

## 2020-09-02 DIAGNOSIS — N186 End stage renal disease: Secondary | ICD-10-CM | POA: Diagnosis not present

## 2020-09-03 DIAGNOSIS — Z992 Dependence on renal dialysis: Secondary | ICD-10-CM | POA: Diagnosis not present

## 2020-09-03 DIAGNOSIS — N186 End stage renal disease: Secondary | ICD-10-CM | POA: Diagnosis not present

## 2020-09-04 ENCOUNTER — Other Ambulatory Visit: Payer: Self-pay | Admitting: Family Medicine

## 2020-09-05 ENCOUNTER — Other Ambulatory Visit: Payer: Self-pay | Admitting: Family Medicine

## 2020-09-05 DIAGNOSIS — D631 Anemia in chronic kidney disease: Secondary | ICD-10-CM | POA: Diagnosis not present

## 2020-09-05 DIAGNOSIS — N2581 Secondary hyperparathyroidism of renal origin: Secondary | ICD-10-CM | POA: Diagnosis not present

## 2020-09-05 DIAGNOSIS — D509 Iron deficiency anemia, unspecified: Secondary | ICD-10-CM | POA: Diagnosis not present

## 2020-09-05 DIAGNOSIS — N186 End stage renal disease: Secondary | ICD-10-CM | POA: Diagnosis not present

## 2020-09-05 DIAGNOSIS — E8779 Other fluid overload: Secondary | ICD-10-CM | POA: Diagnosis not present

## 2020-09-06 ENCOUNTER — Encounter: Payer: Self-pay | Admitting: Internal Medicine

## 2020-09-06 ENCOUNTER — Other Ambulatory Visit: Payer: Self-pay

## 2020-09-06 ENCOUNTER — Ambulatory Visit (INDEPENDENT_AMBULATORY_CARE_PROVIDER_SITE_OTHER): Payer: Medicare Other | Admitting: Internal Medicine

## 2020-09-06 ENCOUNTER — Ambulatory Visit: Payer: Medicare Other

## 2020-09-06 ENCOUNTER — Encounter: Payer: Self-pay | Admitting: Family Medicine

## 2020-09-06 VITALS — BP 188/90 | HR 86 | Temp 97.9°F | Wt 212.0 lb

## 2020-09-06 DIAGNOSIS — Z992 Dependence on renal dialysis: Secondary | ICD-10-CM

## 2020-09-06 DIAGNOSIS — N186 End stage renal disease: Secondary | ICD-10-CM | POA: Diagnosis not present

## 2020-09-06 DIAGNOSIS — B20 Human immunodeficiency virus [HIV] disease: Secondary | ICD-10-CM | POA: Diagnosis not present

## 2020-09-06 NOTE — Progress Notes (Signed)
   Subjective:    Patient ID: Patrick Brown, male    DOB: Oct 31, 1970, 50 y.o.   MRN: [id number]  HPI Here for follow up of HIV He continues on Dovato with no missed doses.  He has had no issues with obtaining, taking or tolerating his medication.  He is now on the kidney transplant list.  No new concerns.    Review of Systems  Constitutional:  Negative for fatigue.  Gastrointestinal:  Negative for diarrhea and nausea.  Skin:  Negative for rash.      Objective:   Physical Exam Eyes:     General: No scleral icterus. Pulmonary:     Effort: Pulmonary effort is normal.  Neurological:     General: No focal deficit present.     Mental Status: He is alert.  Psychiatric:        Mood and Affect: Mood normal.          Assessment & Plan:

## 2020-09-06 NOTE — Assessment & Plan Note (Signed)
He continues on IHD TTS.  On appropriate ARVs.

## 2020-09-06 NOTE — Assessment & Plan Note (Signed)
He continues to do well onDovato, no changes indicated.  Labs today and he will rtc in 6 months.

## 2020-09-07 DIAGNOSIS — N2581 Secondary hyperparathyroidism of renal origin: Secondary | ICD-10-CM | POA: Diagnosis not present

## 2020-09-07 DIAGNOSIS — D509 Iron deficiency anemia, unspecified: Secondary | ICD-10-CM | POA: Diagnosis not present

## 2020-09-07 DIAGNOSIS — E8779 Other fluid overload: Secondary | ICD-10-CM | POA: Diagnosis not present

## 2020-09-07 DIAGNOSIS — D631 Anemia in chronic kidney disease: Secondary | ICD-10-CM | POA: Diagnosis not present

## 2020-09-07 DIAGNOSIS — E119 Type 2 diabetes mellitus without complications: Secondary | ICD-10-CM | POA: Diagnosis not present

## 2020-09-07 DIAGNOSIS — N186 End stage renal disease: Secondary | ICD-10-CM | POA: Diagnosis not present

## 2020-09-07 LAB — T-HELPER CELL (CD4) - (RCID CLINIC ONLY)
CD4 % Helper T Cell: 18 % — ABNORMAL LOW (ref 33–65)
CD4 T Cell Abs: 346 /uL — ABNORMAL LOW (ref 400–1790)

## 2020-09-09 DIAGNOSIS — E8779 Other fluid overload: Secondary | ICD-10-CM | POA: Diagnosis not present

## 2020-09-09 DIAGNOSIS — D509 Iron deficiency anemia, unspecified: Secondary | ICD-10-CM | POA: Diagnosis not present

## 2020-09-09 DIAGNOSIS — N2581 Secondary hyperparathyroidism of renal origin: Secondary | ICD-10-CM | POA: Diagnosis not present

## 2020-09-09 DIAGNOSIS — D631 Anemia in chronic kidney disease: Secondary | ICD-10-CM | POA: Diagnosis not present

## 2020-09-09 DIAGNOSIS — N186 End stage renal disease: Secondary | ICD-10-CM | POA: Diagnosis not present

## 2020-09-09 LAB — HIV-1 RNA QUANT-NO REFLEX-BLD
HIV 1 RNA Quant: NOT DETECTED Copies/mL
HIV-1 RNA Quant, Log: NOT DETECTED Log cps/mL

## 2020-09-12 DIAGNOSIS — E8779 Other fluid overload: Secondary | ICD-10-CM | POA: Diagnosis not present

## 2020-09-12 DIAGNOSIS — D631 Anemia in chronic kidney disease: Secondary | ICD-10-CM | POA: Diagnosis not present

## 2020-09-12 DIAGNOSIS — D509 Iron deficiency anemia, unspecified: Secondary | ICD-10-CM | POA: Diagnosis not present

## 2020-09-12 DIAGNOSIS — N2581 Secondary hyperparathyroidism of renal origin: Secondary | ICD-10-CM | POA: Diagnosis not present

## 2020-09-12 DIAGNOSIS — N186 End stage renal disease: Secondary | ICD-10-CM | POA: Diagnosis not present

## 2020-09-14 ENCOUNTER — Other Ambulatory Visit: Payer: Self-pay | Admitting: Family Medicine

## 2020-09-14 DIAGNOSIS — E8779 Other fluid overload: Secondary | ICD-10-CM | POA: Diagnosis not present

## 2020-09-14 DIAGNOSIS — D509 Iron deficiency anemia, unspecified: Secondary | ICD-10-CM | POA: Diagnosis not present

## 2020-09-14 DIAGNOSIS — N186 End stage renal disease: Secondary | ICD-10-CM | POA: Diagnosis not present

## 2020-09-14 DIAGNOSIS — D631 Anemia in chronic kidney disease: Secondary | ICD-10-CM | POA: Diagnosis not present

## 2020-09-14 DIAGNOSIS — N2581 Secondary hyperparathyroidism of renal origin: Secondary | ICD-10-CM | POA: Diagnosis not present

## 2020-09-16 DIAGNOSIS — N186 End stage renal disease: Secondary | ICD-10-CM | POA: Diagnosis not present

## 2020-09-16 DIAGNOSIS — N2581 Secondary hyperparathyroidism of renal origin: Secondary | ICD-10-CM | POA: Diagnosis not present

## 2020-09-16 DIAGNOSIS — E8779 Other fluid overload: Secondary | ICD-10-CM | POA: Diagnosis not present

## 2020-09-16 DIAGNOSIS — D631 Anemia in chronic kidney disease: Secondary | ICD-10-CM | POA: Diagnosis not present

## 2020-09-16 DIAGNOSIS — D509 Iron deficiency anemia, unspecified: Secondary | ICD-10-CM | POA: Diagnosis not present

## 2020-09-19 DIAGNOSIS — N2581 Secondary hyperparathyroidism of renal origin: Secondary | ICD-10-CM | POA: Diagnosis not present

## 2020-09-19 DIAGNOSIS — D631 Anemia in chronic kidney disease: Secondary | ICD-10-CM | POA: Diagnosis not present

## 2020-09-19 DIAGNOSIS — E8779 Other fluid overload: Secondary | ICD-10-CM | POA: Diagnosis not present

## 2020-09-19 DIAGNOSIS — N186 End stage renal disease: Secondary | ICD-10-CM | POA: Diagnosis not present

## 2020-09-19 DIAGNOSIS — D509 Iron deficiency anemia, unspecified: Secondary | ICD-10-CM | POA: Diagnosis not present

## 2020-09-21 DIAGNOSIS — E8779 Other fluid overload: Secondary | ICD-10-CM | POA: Diagnosis not present

## 2020-09-21 DIAGNOSIS — N2581 Secondary hyperparathyroidism of renal origin: Secondary | ICD-10-CM | POA: Diagnosis not present

## 2020-09-21 DIAGNOSIS — D509 Iron deficiency anemia, unspecified: Secondary | ICD-10-CM | POA: Diagnosis not present

## 2020-09-21 DIAGNOSIS — D631 Anemia in chronic kidney disease: Secondary | ICD-10-CM | POA: Diagnosis not present

## 2020-09-21 DIAGNOSIS — N186 End stage renal disease: Secondary | ICD-10-CM | POA: Diagnosis not present

## 2020-09-23 DIAGNOSIS — N186 End stage renal disease: Secondary | ICD-10-CM | POA: Diagnosis not present

## 2020-09-23 DIAGNOSIS — E8779 Other fluid overload: Secondary | ICD-10-CM | POA: Diagnosis not present

## 2020-09-23 DIAGNOSIS — D631 Anemia in chronic kidney disease: Secondary | ICD-10-CM | POA: Diagnosis not present

## 2020-09-23 DIAGNOSIS — D509 Iron deficiency anemia, unspecified: Secondary | ICD-10-CM | POA: Diagnosis not present

## 2020-09-23 DIAGNOSIS — N2581 Secondary hyperparathyroidism of renal origin: Secondary | ICD-10-CM | POA: Diagnosis not present

## 2020-09-26 DIAGNOSIS — N2581 Secondary hyperparathyroidism of renal origin: Secondary | ICD-10-CM | POA: Diagnosis not present

## 2020-09-26 DIAGNOSIS — D509 Iron deficiency anemia, unspecified: Secondary | ICD-10-CM | POA: Diagnosis not present

## 2020-09-26 DIAGNOSIS — N186 End stage renal disease: Secondary | ICD-10-CM | POA: Diagnosis not present

## 2020-09-26 DIAGNOSIS — D631 Anemia in chronic kidney disease: Secondary | ICD-10-CM | POA: Diagnosis not present

## 2020-09-26 DIAGNOSIS — E8779 Other fluid overload: Secondary | ICD-10-CM | POA: Diagnosis not present

## 2020-09-28 DIAGNOSIS — N2581 Secondary hyperparathyroidism of renal origin: Secondary | ICD-10-CM | POA: Diagnosis not present

## 2020-09-28 DIAGNOSIS — E8779 Other fluid overload: Secondary | ICD-10-CM | POA: Diagnosis not present

## 2020-09-28 DIAGNOSIS — N186 End stage renal disease: Secondary | ICD-10-CM | POA: Diagnosis not present

## 2020-09-28 DIAGNOSIS — D509 Iron deficiency anemia, unspecified: Secondary | ICD-10-CM | POA: Diagnosis not present

## 2020-09-28 DIAGNOSIS — D631 Anemia in chronic kidney disease: Secondary | ICD-10-CM | POA: Diagnosis not present

## 2020-09-29 DIAGNOSIS — D510 Vitamin B12 deficiency anemia due to intrinsic factor deficiency: Secondary | ICD-10-CM | POA: Diagnosis not present

## 2020-09-30 DIAGNOSIS — D509 Iron deficiency anemia, unspecified: Secondary | ICD-10-CM | POA: Diagnosis not present

## 2020-09-30 DIAGNOSIS — E8779 Other fluid overload: Secondary | ICD-10-CM | POA: Diagnosis not present

## 2020-09-30 DIAGNOSIS — D631 Anemia in chronic kidney disease: Secondary | ICD-10-CM | POA: Diagnosis not present

## 2020-09-30 DIAGNOSIS — N2581 Secondary hyperparathyroidism of renal origin: Secondary | ICD-10-CM | POA: Diagnosis not present

## 2020-09-30 DIAGNOSIS — N186 End stage renal disease: Secondary | ICD-10-CM | POA: Diagnosis not present

## 2020-10-02 ENCOUNTER — Other Ambulatory Visit: Payer: Self-pay | Admitting: Family Medicine

## 2020-10-03 DIAGNOSIS — N2581 Secondary hyperparathyroidism of renal origin: Secondary | ICD-10-CM | POA: Diagnosis not present

## 2020-10-03 DIAGNOSIS — D509 Iron deficiency anemia, unspecified: Secondary | ICD-10-CM | POA: Diagnosis not present

## 2020-10-03 DIAGNOSIS — E8779 Other fluid overload: Secondary | ICD-10-CM | POA: Diagnosis not present

## 2020-10-03 DIAGNOSIS — N186 End stage renal disease: Secondary | ICD-10-CM | POA: Diagnosis not present

## 2020-10-03 DIAGNOSIS — D631 Anemia in chronic kidney disease: Secondary | ICD-10-CM | POA: Diagnosis not present

## 2020-10-04 DIAGNOSIS — N186 End stage renal disease: Secondary | ICD-10-CM | POA: Diagnosis not present

## 2020-10-04 DIAGNOSIS — Z992 Dependence on renal dialysis: Secondary | ICD-10-CM | POA: Diagnosis not present

## 2020-10-05 DIAGNOSIS — D631 Anemia in chronic kidney disease: Secondary | ICD-10-CM | POA: Diagnosis not present

## 2020-10-05 DIAGNOSIS — N186 End stage renal disease: Secondary | ICD-10-CM | POA: Diagnosis not present

## 2020-10-05 DIAGNOSIS — N2581 Secondary hyperparathyroidism of renal origin: Secondary | ICD-10-CM | POA: Diagnosis not present

## 2020-10-05 DIAGNOSIS — E8779 Other fluid overload: Secondary | ICD-10-CM | POA: Diagnosis not present

## 2020-10-05 DIAGNOSIS — D509 Iron deficiency anemia, unspecified: Secondary | ICD-10-CM | POA: Diagnosis not present

## 2020-10-07 DIAGNOSIS — N186 End stage renal disease: Secondary | ICD-10-CM | POA: Diagnosis not present

## 2020-10-07 DIAGNOSIS — D631 Anemia in chronic kidney disease: Secondary | ICD-10-CM | POA: Diagnosis not present

## 2020-10-07 DIAGNOSIS — D509 Iron deficiency anemia, unspecified: Secondary | ICD-10-CM | POA: Diagnosis not present

## 2020-10-07 DIAGNOSIS — N2581 Secondary hyperparathyroidism of renal origin: Secondary | ICD-10-CM | POA: Diagnosis not present

## 2020-10-07 DIAGNOSIS — E8779 Other fluid overload: Secondary | ICD-10-CM | POA: Diagnosis not present

## 2020-10-10 DIAGNOSIS — E8779 Other fluid overload: Secondary | ICD-10-CM | POA: Diagnosis not present

## 2020-10-10 DIAGNOSIS — D509 Iron deficiency anemia, unspecified: Secondary | ICD-10-CM | POA: Diagnosis not present

## 2020-10-10 DIAGNOSIS — N2581 Secondary hyperparathyroidism of renal origin: Secondary | ICD-10-CM | POA: Diagnosis not present

## 2020-10-10 DIAGNOSIS — N186 End stage renal disease: Secondary | ICD-10-CM | POA: Diagnosis not present

## 2020-10-10 DIAGNOSIS — D631 Anemia in chronic kidney disease: Secondary | ICD-10-CM | POA: Diagnosis not present

## 2020-10-12 DIAGNOSIS — D631 Anemia in chronic kidney disease: Secondary | ICD-10-CM | POA: Diagnosis not present

## 2020-10-12 DIAGNOSIS — E8779 Other fluid overload: Secondary | ICD-10-CM | POA: Diagnosis not present

## 2020-10-12 DIAGNOSIS — D509 Iron deficiency anemia, unspecified: Secondary | ICD-10-CM | POA: Diagnosis not present

## 2020-10-12 DIAGNOSIS — E119 Type 2 diabetes mellitus without complications: Secondary | ICD-10-CM | POA: Diagnosis not present

## 2020-10-12 DIAGNOSIS — N2581 Secondary hyperparathyroidism of renal origin: Secondary | ICD-10-CM | POA: Diagnosis not present

## 2020-10-12 DIAGNOSIS — N186 End stage renal disease: Secondary | ICD-10-CM | POA: Diagnosis not present

## 2020-10-13 ENCOUNTER — Other Ambulatory Visit: Payer: Self-pay | Admitting: Family Medicine

## 2020-10-13 DIAGNOSIS — E109 Type 1 diabetes mellitus without complications: Secondary | ICD-10-CM | POA: Diagnosis not present

## 2020-10-13 DIAGNOSIS — E1022 Type 1 diabetes mellitus with diabetic chronic kidney disease: Secondary | ICD-10-CM | POA: Diagnosis not present

## 2020-10-14 DIAGNOSIS — N2581 Secondary hyperparathyroidism of renal origin: Secondary | ICD-10-CM | POA: Diagnosis not present

## 2020-10-14 DIAGNOSIS — D509 Iron deficiency anemia, unspecified: Secondary | ICD-10-CM | POA: Diagnosis not present

## 2020-10-14 DIAGNOSIS — N186 End stage renal disease: Secondary | ICD-10-CM | POA: Diagnosis not present

## 2020-10-14 DIAGNOSIS — D631 Anemia in chronic kidney disease: Secondary | ICD-10-CM | POA: Diagnosis not present

## 2020-10-14 DIAGNOSIS — E8779 Other fluid overload: Secondary | ICD-10-CM | POA: Diagnosis not present

## 2020-10-17 DIAGNOSIS — N2581 Secondary hyperparathyroidism of renal origin: Secondary | ICD-10-CM | POA: Diagnosis not present

## 2020-10-17 DIAGNOSIS — D631 Anemia in chronic kidney disease: Secondary | ICD-10-CM | POA: Diagnosis not present

## 2020-10-17 DIAGNOSIS — E8779 Other fluid overload: Secondary | ICD-10-CM | POA: Diagnosis not present

## 2020-10-17 DIAGNOSIS — D509 Iron deficiency anemia, unspecified: Secondary | ICD-10-CM | POA: Diagnosis not present

## 2020-10-17 DIAGNOSIS — N186 End stage renal disease: Secondary | ICD-10-CM | POA: Diagnosis not present

## 2020-10-19 DIAGNOSIS — D631 Anemia in chronic kidney disease: Secondary | ICD-10-CM | POA: Diagnosis not present

## 2020-10-19 DIAGNOSIS — D509 Iron deficiency anemia, unspecified: Secondary | ICD-10-CM | POA: Diagnosis not present

## 2020-10-19 DIAGNOSIS — N2581 Secondary hyperparathyroidism of renal origin: Secondary | ICD-10-CM | POA: Diagnosis not present

## 2020-10-19 DIAGNOSIS — E8779 Other fluid overload: Secondary | ICD-10-CM | POA: Diagnosis not present

## 2020-10-19 DIAGNOSIS — N186 End stage renal disease: Secondary | ICD-10-CM | POA: Diagnosis not present

## 2020-10-21 DIAGNOSIS — D509 Iron deficiency anemia, unspecified: Secondary | ICD-10-CM | POA: Diagnosis not present

## 2020-10-21 DIAGNOSIS — N2581 Secondary hyperparathyroidism of renal origin: Secondary | ICD-10-CM | POA: Diagnosis not present

## 2020-10-21 DIAGNOSIS — N186 End stage renal disease: Secondary | ICD-10-CM | POA: Diagnosis not present

## 2020-10-21 DIAGNOSIS — D631 Anemia in chronic kidney disease: Secondary | ICD-10-CM | POA: Diagnosis not present

## 2020-10-21 DIAGNOSIS — E8779 Other fluid overload: Secondary | ICD-10-CM | POA: Diagnosis not present

## 2020-10-23 ENCOUNTER — Encounter: Payer: Self-pay | Admitting: Pulmonary Disease

## 2020-10-23 ENCOUNTER — Ambulatory Visit (INDEPENDENT_AMBULATORY_CARE_PROVIDER_SITE_OTHER): Payer: Medicare Other | Admitting: Pulmonary Disease

## 2020-10-23 ENCOUNTER — Other Ambulatory Visit: Payer: Self-pay

## 2020-10-23 VITALS — BP 150/84 | HR 79 | Temp 97.5°F | Ht 70.0 in | Wt 210.4 lb

## 2020-10-23 DIAGNOSIS — J4540 Moderate persistent asthma, uncomplicated: Secondary | ICD-10-CM | POA: Diagnosis not present

## 2020-10-23 DIAGNOSIS — J301 Allergic rhinitis due to pollen: Secondary | ICD-10-CM | POA: Diagnosis not present

## 2020-10-23 DIAGNOSIS — Z9989 Dependence on other enabling machines and devices: Secondary | ICD-10-CM | POA: Diagnosis not present

## 2020-10-23 DIAGNOSIS — R053 Chronic cough: Secondary | ICD-10-CM | POA: Diagnosis not present

## 2020-10-23 DIAGNOSIS — G4733 Obstructive sleep apnea (adult) (pediatric): Secondary | ICD-10-CM

## 2020-10-23 MED ORDER — MONTELUKAST SODIUM 10 MG PO TABS: 10.0000 mg | ORAL_TABLET | Freq: Every day | ORAL | 6 refills | Status: DC

## 2020-10-23 MED ORDER — BUDESONIDE-FORMOTEROL FUMARATE 160-4.5 MCG/ACT IN AERO: 2.0000 | INHALATION_SPRAY | Freq: Two times a day (BID) | RESPIRATORY_TRACT | 6 refills | Status: DC

## 2020-10-23 NOTE — Patient Instructions (Signed)
Symbicort two puffs in the morning and two puffs in the evening, and rinse your mouth after each use  Stop using flovent once you start using symbicort  Singulair 10 mg pill nightly  Follow up in 4 months

## 2020-10-23 NOTE — Progress Notes (Signed)
[facility]  Chief Complaint  Patient presents with   Follow-up    Cough    Constitutional:  BP (!) 150/84 (BP Location: Left Arm)   Pulse 79   Temp (!) 97.5 F (36.4 C) (Oral)   Ht 5\' 10"  (1.778 m)   Wt 210 lb 6.4 oz (95.4 kg)   SpO2 96%   BMI 30.19 kg/m   Past Medical History:  Anemia, Pneumonia, ESRD, DJD, Depression, HLD, GERD, Gastroparesis, HIV, Hypothyroidism, DM, Retinopathy, Pancreatic insufficiency  Past Surgical History:  He  has a past surgical history that includes Vitrectomy (bilateral); insulin pump; Colonoscopy; Esophagogastroduodenoscopy; LASIK (Bilateral); Cataract extraction (Right); Video bronchoscopy (Bilateral, 12/15/2012); Eye surgery ([id number]); and Hip Arthroplasty (Right, 05/10/2016).  Brief Summary:  Patrick Brown is a 50 y.o. male with obstructive sleep apnea.      Subjective:   He is here with his mother.  His cough is flaring up again.  Started about 2 weeks ago.  He is starting to need cough medicine again.  Not having much sinus drainage.  Sometimes brings up sputum.  Not having wheeze or fever.  Uses CPAP nightly w/o issue.   Physical Exam:   Appearance - well kempt   ENMT - no sinus tenderness, no oral exudate, no LAN, Mallampati 3 airway, no stridor  Respiratory - equal breath sounds bilaterally, no wheezing or rales  CV - s1s2 regular rate and rhythm, no murmurs  Ext - no clubbing, no edema  Skin - no rashes  Psych - normal mood and affect    Chest imaging:  CT sinus 06/24/20 >> negative CT chest 06/24/20 >> coronary calcification, calcified mediastinal and hilar LN, minimal pleuro-parenchymal scarring, basilar ATX, 2 mm nodule LUL  Sleep Tests:  PSG 09/26/14 >> AHI 11.3, SpO2 low 86%, PLMI 51.9 Auto CPAP 04/16/20 to 05/15/20 >> used on 29 of 30 nights with average 8 hrs 30 min.  Average AHI 4.6 with median CPAP 9 and 95 th percentile CPAP 12 cm H2O  Social History:  He  reports  that he has never smoked. He has never used smokeless tobacco. He reports that he does not drink alcohol and does not use drugs.  Family History:  His family history includes Dementia in some other family members; Diabetes type I in his brother; Prostate cancer in an other family member.     Assessment/Plan:   Obstructive sleep apnea. - he is compliant with CPAP and reports benefit from therapy - he uses [employer] for his DME - continue auto CPAP 5 to 15 cm H2O  Chronic cough. - from allergic rhinitis with post nasal drip and moderate persistent asthma - having more trouble with Fall season again - will change from flovent to symbicort  - add singulair - continue xyzal, flonase - prn albuterol  ESRD. - followed by Dr. Loura Back with nephrology at [facility] for [facility]  Pancreatic insufficiency. - followed by Dr. Shirleen Schirmer with gastroenterology with [facility]  Time Spent Involved in Patient Care on Day of Examination:  31 minutes  Follow up:   Patient Instructions  Symbicort two puffs in the morning and two puffs in the evening, and rinse your mouth after each use  Stop using flovent once you start using symbicort  Singulair 10 mg pill nightly  Follow up in 4 months  Medication List:   Allergies as of 10/23/2020       Reactions   Sulfa Antibiotics Other (See Comments)  High potassium   Ramipril Cough   Versed [midazolam] Other (See Comments)   "I don't wake up very good or clear it out of my system"        Medication List        Accurate as of October 23, 2020 10:34 AM. If you have any questions, ask your nurse or doctor.          STOP taking these medications    Flovent HFA 110 MCG/ACT inhaler Generic drug: fluticasone Stopped by: Chesley Mires, MD       TAKE these medications    acetaminophen 500 MG tablet Commonly known as: TYLENOL Take 1,000 mg by mouth 2 (two) times daily.   acyclovir 400 MG tablet Commonly known as: ZOVIRAX Takes  mondays, wednesdays and fridays   AgaMatrix Ultra-Thin Lancets Misc TEST BS 4 TIMES A DAY AND AS NEEDED DX E10.65   albuterol 108 (90 Base) MCG/ACT inhaler Commonly known as: VENTOLIN HFA Inhale 2 puffs into the lungs every 6 (six) hours as needed for wheezing or shortness of breath.   albuterol (2.5 MG/3ML) 0.083% nebulizer solution Commonly known as: PROVENTIL Take 3 mLs (2.5 mg total) by nebulization every 6 (six) hours as needed for wheezing or shortness of breath.   amLODipine 10 MG tablet Commonly known as: NORVASC Take 10 mg by mouth at bedtime as needed.   amLODipine 5 MG tablet Commonly known as: NORVASC SMARTSIG:1 Tablet(s) By Mouth Every Evening   aspirin 81 MG chewable tablet Chew 81 mg by mouth every morning.   budesonide-formoterol 160-4.5 MCG/ACT inhaler Commonly known as: Symbicort Inhale 2 puffs into the lungs in the morning and at bedtime. Started by: Chesley Mires, MD   cyclobenzaprine 10 MG tablet Commonly known as: FLEXERIL TAKE 1 TABLET BY MOUTH THREE TIMES A DAY AS NEEDED FOR MUSCLE SPASMS   diclofenac sodium 1 % Gel Commonly known as: VOLTAREN Apply 2 g topically 4 (four) times daily.   diphenoxylate-atropine 2.5-0.025 MG tablet Commonly known as: Lomotil Take 1 tablet by mouth 4 (four) times daily as needed for diarrhea or loose stools.   Dovato 50-300 MG Tabs Generic drug: Dolutegravir-lamiVUDine Take 1 tablet by mouth daily.   DULoxetine 30 MG capsule Commonly known as: CYMBALTA TAKE 1 CAPSULE (30 MG TOTAL) BY MOUTH DAILY. TAKE WITH THE 60MG  FOR TOTAL OF 90MG    DULoxetine 60 MG capsule Commonly known as: CYMBALTA TAKE 1 CAPSULE (60 MG TOTAL) BY MOUTH DAILY. TAKE WITH 30 MG FOT A TOTAL OF 90MG    esomeprazole 40 MG capsule Commonly known as: NEXIUM TAKE 1 CAPSULE BY MOUTH EVERY DAY   febuxostat 40 MG tablet Commonly known as: ULORIC TAKE 1 TABLET BY MOUTH EVERY DAY   ferrous sulfate 325 (65 FE) MG tablet Take 650 mg by mouth daily  with breakfast.   fluticasone 50 MCG/ACT nasal spray Commonly known as: FLONASE SPRAY 2 SPRAYS INTO EACH NOSTRIL EVERY DAY   furosemide 40 MG tablet Commonly known as: LASIX Take 40 mg by mouth daily. 40 mg nightly   GLUCOSAMINE 1500 COMPLEX PO Take 1 tablet by mouth 2 (two) times daily.   glucosamine-chondroitin 500-400 MG tablet Take by mouth.   glucose blood test strip Commonly known as: OneTouch Verio TEST BLOOD SUGAR 4 TIMES DAILY AND AS NEEDED   hydrALAZINE 25 MG tablet Commonly known as: APRESOLINE Take 25 mg by mouth 2 (two) times daily.   hydrOXYzine 25 MG tablet Commonly known as: ATARAX/VISTARIL TAKE 1 TABLET BY MOUTH EVERY  8 HOURS AS NEEDED   hyoscyamine 0.125 MG tablet Commonly known as: LEVSIN TAKE 1 TABLET (0.125 MG TOTAL) BY MOUTH EVERY 4 (FOUR) HOURS AS NEEDED.   icosapent Ethyl 1 g capsule Commonly known as: VASCEPA TAKE 2 CAPSULES BY MOUTH TWICE A DAY   insulin glargine 100 UNIT/ML injection Commonly known as: LANTUS In the event of insulin pump failure, inject 8 units twice daily   insulin lispro 100 UNIT/ML injection Commonly known as: HumaLOG USE 42 UNITS TO 120 UNITS PER PUMP DAILY AS DIRECTED   ipratropium 0.03 % nasal spray Commonly known as: ATROVENT USE 2 SPRAYS IN EACH NOSTRIL 2-3 TIMES DAILY   levocetirizine 5 MG tablet Commonly known as: XYZAL Take 1 tablet (5 mg total) by mouth every evening.   levothyroxine 200 MCG tablet Commonly known as: SYNTHROID Take 1 tablet (200 mcg total) by mouth daily.   lipase/protease/amylase 12000-38000 units Cpep capsule Commonly known as: CREON Take 2 capsules prior to meals and 1 capsule prior to snacks   LORazepam 0.5 MG tablet Commonly known as: ATIVAN Take by mouth.   losartan 100 MG tablet Commonly known as: COZAAR Take 100 mg by mouth daily.   losartan 50 MG tablet Commonly known as: COZAAR Take 50 mg by mouth daily.   meclizine 12.5 MG tablet Commonly known as:  ANTIVERT Take 1 tablet (12.5 mg total) by mouth 3 (three) times daily as needed for dizziness.   metoCLOPramide 5 MG tablet Commonly known as: REGLAN Take 1 tablet (5 mg total) by mouth 3 (three) times daily before meals.   Misc Intestinal Flora Regulat Caps Take 1 capsule by mouth every morning.   montelukast 10 MG tablet Commonly known as: SINGULAIR Take 1 tablet (10 mg total) by mouth at bedtime. Started by: Chesley Mires, MD   niacin 1000 MG CR tablet Commonly known as: NIASPAN TAKE 1 TABLET (1,000 MG TOTAL) BY MOUTH AT BEDTIME.   ondansetron 4 MG tablet Commonly known as: ZOFRAN TAKE 1 TABLET BY MOUTH EVERY 8 HOURS AS NEEDED FOR NAUSEA   promethazine 25 MG suppository Commonly known as: Phenergan Place 1 suppository (25 mg total) rectally every 6 (six) hours as needed for nausea or vomiting.   rosuvastatin 20 MG tablet Commonly known as: CRESTOR Take 20 mg by mouth at bedtime.   sevelamer carbonate 800 MG tablet Commonly known as: RENVELA Take 800 mg by mouth 3 (three) times daily. 2 tabs before each meal   tamsulosin 0.4 MG Caps capsule Commonly known as: FLOMAX Take 0.4 mg by mouth daily.   Testosterone 20.25 MG/ACT (1.62%) Gel APPLY 3 PUMPS DAILY AS DIRECTED   traMADol 50 MG tablet Commonly known as: ULTRAM Take 1 tablet (50 mg total) by mouth daily as needed.   traZODone 50 MG tablet Commonly known as: DESYREL Take by mouth.        Signature:  Chesley Mires, MD [place] Pager - [phone] 10/23/2020, 10:34 AM

## 2020-10-24 DIAGNOSIS — E8779 Other fluid overload: Secondary | ICD-10-CM | POA: Diagnosis not present

## 2020-10-24 DIAGNOSIS — D631 Anemia in chronic kidney disease: Secondary | ICD-10-CM | POA: Diagnosis not present

## 2020-10-24 DIAGNOSIS — D509 Iron deficiency anemia, unspecified: Secondary | ICD-10-CM | POA: Diagnosis not present

## 2020-10-24 DIAGNOSIS — N2581 Secondary hyperparathyroidism of renal origin: Secondary | ICD-10-CM | POA: Diagnosis not present

## 2020-10-24 DIAGNOSIS — N186 End stage renal disease: Secondary | ICD-10-CM | POA: Diagnosis not present

## 2020-10-26 DIAGNOSIS — N186 End stage renal disease: Secondary | ICD-10-CM | POA: Diagnosis not present

## 2020-10-26 DIAGNOSIS — D509 Iron deficiency anemia, unspecified: Secondary | ICD-10-CM | POA: Diagnosis not present

## 2020-10-26 DIAGNOSIS — N2581 Secondary hyperparathyroidism of renal origin: Secondary | ICD-10-CM | POA: Diagnosis not present

## 2020-10-26 DIAGNOSIS — D631 Anemia in chronic kidney disease: Secondary | ICD-10-CM | POA: Diagnosis not present

## 2020-10-26 DIAGNOSIS — E8779 Other fluid overload: Secondary | ICD-10-CM | POA: Diagnosis not present

## 2020-10-27 DIAGNOSIS — D510 Vitamin B12 deficiency anemia due to intrinsic factor deficiency: Secondary | ICD-10-CM | POA: Diagnosis not present

## 2020-10-28 DIAGNOSIS — D631 Anemia in chronic kidney disease: Secondary | ICD-10-CM | POA: Diagnosis not present

## 2020-10-28 DIAGNOSIS — N2581 Secondary hyperparathyroidism of renal origin: Secondary | ICD-10-CM | POA: Diagnosis not present

## 2020-10-28 DIAGNOSIS — D509 Iron deficiency anemia, unspecified: Secondary | ICD-10-CM | POA: Diagnosis not present

## 2020-10-28 DIAGNOSIS — N186 End stage renal disease: Secondary | ICD-10-CM | POA: Diagnosis not present

## 2020-10-28 DIAGNOSIS — E8779 Other fluid overload: Secondary | ICD-10-CM | POA: Diagnosis not present

## 2020-10-30 DIAGNOSIS — G4733 Obstructive sleep apnea (adult) (pediatric): Secondary | ICD-10-CM | POA: Diagnosis not present

## 2020-10-31 ENCOUNTER — Other Ambulatory Visit: Payer: Self-pay | Admitting: Family Medicine

## 2020-10-31 DIAGNOSIS — D631 Anemia in chronic kidney disease: Secondary | ICD-10-CM | POA: Diagnosis not present

## 2020-10-31 DIAGNOSIS — D509 Iron deficiency anemia, unspecified: Secondary | ICD-10-CM | POA: Diagnosis not present

## 2020-10-31 DIAGNOSIS — E8779 Other fluid overload: Secondary | ICD-10-CM | POA: Diagnosis not present

## 2020-10-31 DIAGNOSIS — N2581 Secondary hyperparathyroidism of renal origin: Secondary | ICD-10-CM | POA: Diagnosis not present

## 2020-10-31 DIAGNOSIS — N186 End stage renal disease: Secondary | ICD-10-CM | POA: Diagnosis not present

## 2020-11-01 IMAGING — DX DG CHEST 2V
2 series · 2 of 2 positions shown · non-contrast
Comparison: 05/13/2017

CLINICAL DATA: History of HIV, persistent cough

EXAM:
CHEST - 2 VIEW

[chest pa]
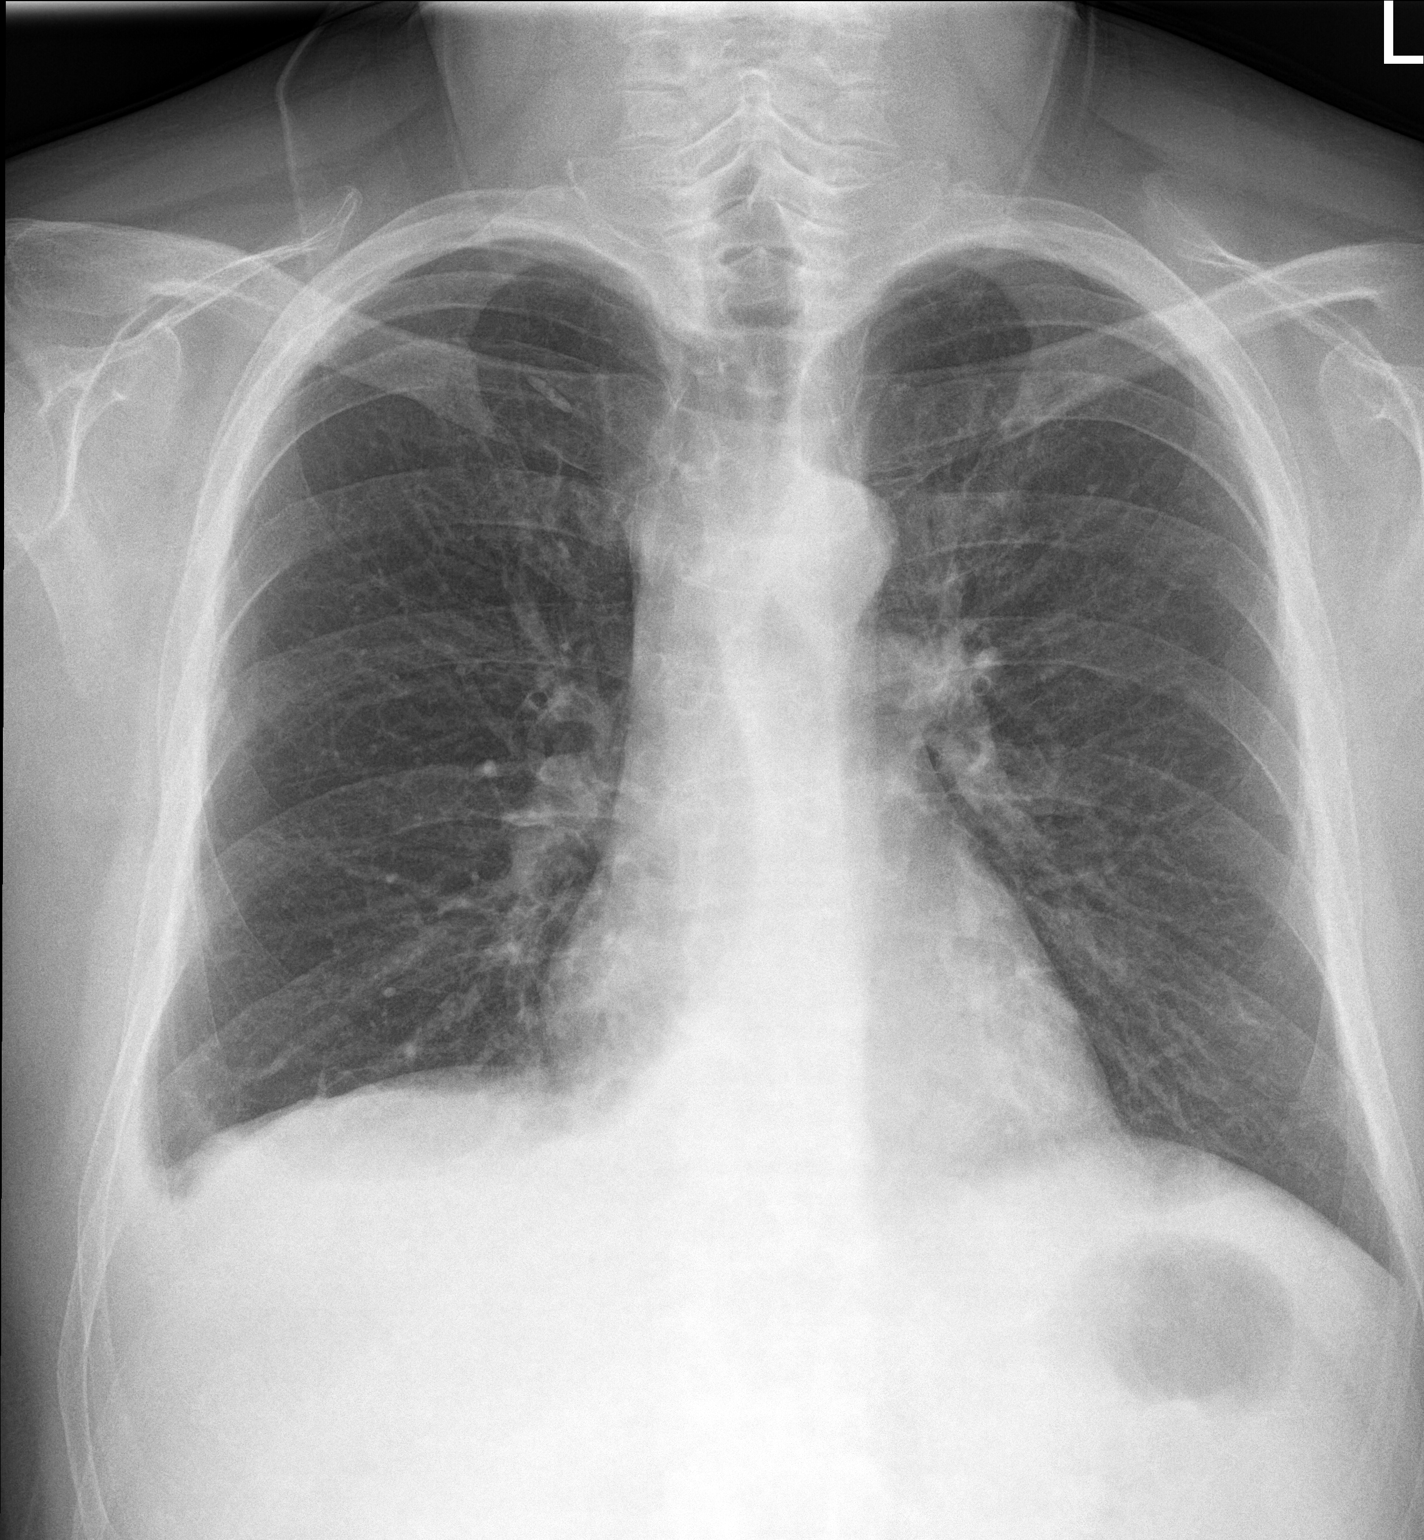

[chest lat]
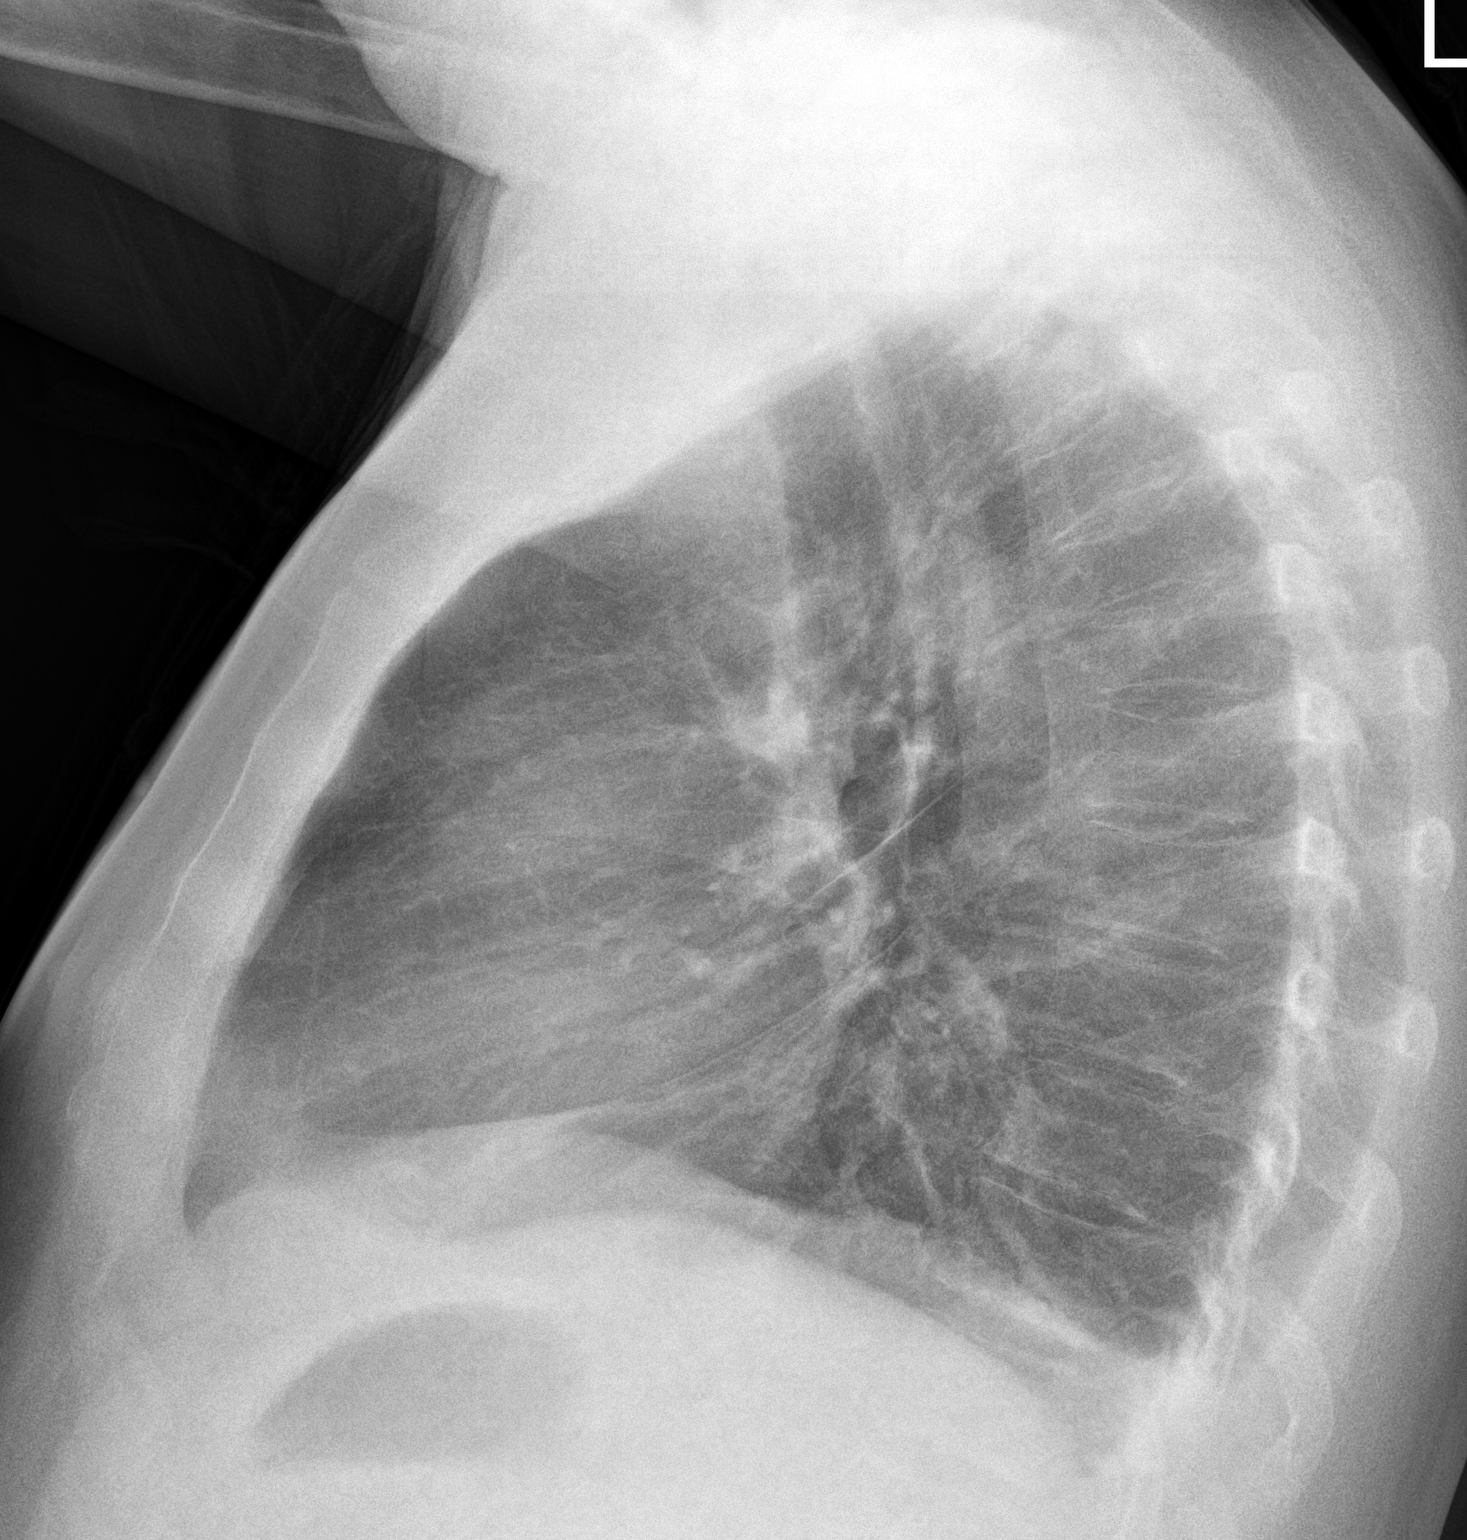

[2 of 2 positions shown; findings below may reference images not displayed]

FINDINGS: The heart size and mediastinal contours are within normal limits.
Small right pleural effusion. The visualized skeletal structures are
unremarkable.
IMPRESSION: Small right pleural effusion.  No acute appearing airspace opacity.

## 2020-11-02 DIAGNOSIS — E8779 Other fluid overload: Secondary | ICD-10-CM | POA: Diagnosis not present

## 2020-11-02 DIAGNOSIS — D631 Anemia in chronic kidney disease: Secondary | ICD-10-CM | POA: Diagnosis not present

## 2020-11-02 DIAGNOSIS — D509 Iron deficiency anemia, unspecified: Secondary | ICD-10-CM | POA: Diagnosis not present

## 2020-11-02 DIAGNOSIS — N186 End stage renal disease: Secondary | ICD-10-CM | POA: Diagnosis not present

## 2020-11-02 DIAGNOSIS — N2581 Secondary hyperparathyroidism of renal origin: Secondary | ICD-10-CM | POA: Diagnosis not present

## 2020-11-03 DIAGNOSIS — Z992 Dependence on renal dialysis: Secondary | ICD-10-CM | POA: Diagnosis not present

## 2020-11-03 DIAGNOSIS — N186 End stage renal disease: Secondary | ICD-10-CM | POA: Diagnosis not present

## 2020-11-04 DIAGNOSIS — N186 End stage renal disease: Secondary | ICD-10-CM | POA: Diagnosis not present

## 2020-11-04 DIAGNOSIS — D509 Iron deficiency anemia, unspecified: Secondary | ICD-10-CM | POA: Diagnosis not present

## 2020-11-04 DIAGNOSIS — E8779 Other fluid overload: Secondary | ICD-10-CM | POA: Diagnosis not present

## 2020-11-04 DIAGNOSIS — N2581 Secondary hyperparathyroidism of renal origin: Secondary | ICD-10-CM | POA: Diagnosis not present

## 2020-11-04 DIAGNOSIS — D631 Anemia in chronic kidney disease: Secondary | ICD-10-CM | POA: Diagnosis not present

## 2020-11-06 ENCOUNTER — Other Ambulatory Visit: Payer: Self-pay | Admitting: Family Medicine

## 2020-11-06 MED ORDER — HYDROCODONE BIT-HOMATROP MBR 5-1.5 MG/5ML PO SOLN: 5.0000 mL | Freq: Four times a day (QID) | ORAL | 0 refills | Status: DC | PRN

## 2020-11-06 NOTE — Telephone Encounter (Signed)
VS please advise. Thanks  Dr Halford Chessman   Since my visit on 10/23/20, my cough for two weeks is gradually getting more frequent and deeper.  So far cough medicine (Delsym) is helping. Need some assistance before cough gets as bad as it was earlier this year which lasted for seven months.  This cough has started the same way the cough did before.   Thanks [employer] [phone]

## 2020-11-06 NOTE — Telephone Encounter (Signed)
Please make sure he is using flovent two puffs bid.  Hx should continue using xyzal 5 mg nightly, flonase 1 spray in each nostril daily.  He should use salt water gargles twice per day, and use 1 teaspoon of local honey twice per day.  I have sent script for hycodan.

## 2020-11-07 DIAGNOSIS — D631 Anemia in chronic kidney disease: Secondary | ICD-10-CM | POA: Diagnosis not present

## 2020-11-07 DIAGNOSIS — E8779 Other fluid overload: Secondary | ICD-10-CM | POA: Diagnosis not present

## 2020-11-07 DIAGNOSIS — N186 End stage renal disease: Secondary | ICD-10-CM | POA: Diagnosis not present

## 2020-11-07 DIAGNOSIS — D509 Iron deficiency anemia, unspecified: Secondary | ICD-10-CM | POA: Diagnosis not present

## 2020-11-07 DIAGNOSIS — N2581 Secondary hyperparathyroidism of renal origin: Secondary | ICD-10-CM | POA: Diagnosis not present

## 2020-11-09 DIAGNOSIS — D509 Iron deficiency anemia, unspecified: Secondary | ICD-10-CM | POA: Diagnosis not present

## 2020-11-09 DIAGNOSIS — D631 Anemia in chronic kidney disease: Secondary | ICD-10-CM | POA: Diagnosis not present

## 2020-11-09 DIAGNOSIS — E119 Type 2 diabetes mellitus without complications: Secondary | ICD-10-CM | POA: Diagnosis not present

## 2020-11-09 DIAGNOSIS — N2581 Secondary hyperparathyroidism of renal origin: Secondary | ICD-10-CM | POA: Diagnosis not present

## 2020-11-09 DIAGNOSIS — E8779 Other fluid overload: Secondary | ICD-10-CM | POA: Diagnosis not present

## 2020-11-09 DIAGNOSIS — N186 End stage renal disease: Secondary | ICD-10-CM | POA: Diagnosis not present

## 2020-11-11 DIAGNOSIS — D509 Iron deficiency anemia, unspecified: Secondary | ICD-10-CM | POA: Diagnosis not present

## 2020-11-11 DIAGNOSIS — N186 End stage renal disease: Secondary | ICD-10-CM | POA: Diagnosis not present

## 2020-11-11 DIAGNOSIS — D631 Anemia in chronic kidney disease: Secondary | ICD-10-CM | POA: Diagnosis not present

## 2020-11-11 DIAGNOSIS — E8779 Other fluid overload: Secondary | ICD-10-CM | POA: Diagnosis not present

## 2020-11-11 DIAGNOSIS — N2581 Secondary hyperparathyroidism of renal origin: Secondary | ICD-10-CM | POA: Diagnosis not present

## 2020-11-14 DIAGNOSIS — N2581 Secondary hyperparathyroidism of renal origin: Secondary | ICD-10-CM | POA: Diagnosis not present

## 2020-11-14 DIAGNOSIS — E8779 Other fluid overload: Secondary | ICD-10-CM | POA: Diagnosis not present

## 2020-11-14 DIAGNOSIS — D631 Anemia in chronic kidney disease: Secondary | ICD-10-CM | POA: Diagnosis not present

## 2020-11-14 DIAGNOSIS — N186 End stage renal disease: Secondary | ICD-10-CM | POA: Diagnosis not present

## 2020-11-14 DIAGNOSIS — D509 Iron deficiency anemia, unspecified: Secondary | ICD-10-CM | POA: Diagnosis not present

## 2020-11-15 ENCOUNTER — Ambulatory Visit (INDEPENDENT_AMBULATORY_CARE_PROVIDER_SITE_OTHER): Payer: Medicare Other | Admitting: Family Medicine

## 2020-11-15 ENCOUNTER — Other Ambulatory Visit: Payer: Self-pay

## 2020-11-15 ENCOUNTER — Encounter: Payer: Self-pay | Admitting: Family Medicine

## 2020-11-15 VITALS — BP 158/75 | HR 84 | Ht 70.0 in | Wt 214.0 lb

## 2020-11-15 DIAGNOSIS — E103551 Type 1 diabetes mellitus with stable proliferative diabetic retinopathy, right eye: Secondary | ICD-10-CM | POA: Diagnosis not present

## 2020-11-15 DIAGNOSIS — E103552 Type 1 diabetes mellitus with stable proliferative diabetic retinopathy, left eye: Secondary | ICD-10-CM | POA: Diagnosis not present

## 2020-11-15 DIAGNOSIS — Z23 Encounter for immunization: Secondary | ICD-10-CM | POA: Diagnosis not present

## 2020-11-15 DIAGNOSIS — N186 End stage renal disease: Secondary | ICD-10-CM

## 2020-11-15 DIAGNOSIS — N1832 Chronic kidney disease, stage 3b: Secondary | ICD-10-CM

## 2020-11-15 DIAGNOSIS — E1022 Type 1 diabetes mellitus with diabetic chronic kidney disease: Secondary | ICD-10-CM | POA: Diagnosis not present

## 2020-11-15 DIAGNOSIS — E039 Hypothyroidism, unspecified: Secondary | ICD-10-CM

## 2020-11-15 DIAGNOSIS — Z992 Dependence on renal dialysis: Secondary | ICD-10-CM | POA: Diagnosis not present

## 2020-11-15 MED ORDER — METOCLOPRAMIDE HCL 5 MG PO TABS: 5.0000 mg | ORAL_TABLET | Freq: Three times a day (TID) | ORAL | 3 refills | Status: DC

## 2020-11-15 MED ORDER — HYDROXYZINE HCL 25 MG PO TABS: 25.0000 mg | ORAL_TABLET | Freq: Three times a day (TID) | ORAL | 3 refills | Status: DC | PRN

## 2020-11-15 MED ORDER — FEBUXOSTAT 40 MG PO TABS: 40.0000 mg | ORAL_TABLET | Freq: Every day | ORAL | 3 refills | Status: AC

## 2020-11-15 MED ORDER — DULOXETINE HCL 60 MG PO CPEP: 60.0000 mg | ORAL_CAPSULE | Freq: Every day | ORAL | 3 refills | Status: DC

## 2020-11-15 MED ORDER — ICOSAPENT ETHYL 1 G PO CAPS: 2.0000 g | ORAL_CAPSULE | Freq: Two times a day (BID) | ORAL | 3 refills | Status: DC

## 2020-11-15 MED ORDER — DULOXETINE HCL 30 MG PO CPEP: 30.0000 mg | ORAL_CAPSULE | Freq: Every day | ORAL | 3 refills | Status: DC

## 2020-11-15 MED ORDER — ESOMEPRAZOLE MAGNESIUM 40 MG PO CPDR: 40.0000 mg | DELAYED_RELEASE_CAPSULE | Freq: Every day | ORAL | 3 refills | Status: DC

## 2020-11-15 NOTE — Progress Notes (Signed)
BP (!) 158/75   Pulse 84   Ht 5\' 10"  (1.778 m)   Wt 214 lb (97.1 kg)   SpO2 97%   BMI 30.71 kg/m    Subjective:   Patient ID: Patrick Brown, male    DOB: 1970/09/27, 50 y.o.   MRN: [id number]  HPI: Mohamad Bruso is a 50 y.o. male presenting on 11/15/2020 for Medical Management of Chronic Issues, Diabetes, and Chronic Kidney Disease   HPI Patient has gastric motility and GERD issues Patient takes Reglan Protonix and they seem to be helping him well.  He denies any major issues.  He still sees GI once a year.  Patient has type 1 diabetes and is going to see his new endocrinologist later today.  They also manage his thyroid as well.  Patient has end-stage renal disease and is on dialysis with nephrology.  They manage his hypertension as well.  His blood pressure is up slightly but they manage it closely and just backed off on his medicines because he was running too low.  He will monitor it with them.  Hyperlipidemia Patient is coming in for recheck of his hyperlipidemia. The patient is currently taking Vascepa and Crestor. They deny any issues with myalgias or history of liver damage from it. They deny any focal numbness or weakness or chest pain.   Patient has HIV and has been under control and sees infectious disease for that.  Relevant past medical, surgical, family and social history reviewed and updated as indicated. Interim medical history since our last visit reviewed. Allergies and medications reviewed and updated.  Review of Systems  Constitutional:  Negative for chills and fever.  Respiratory:  Negative for shortness of breath and wheezing.   Cardiovascular:  Negative for chest pain and leg swelling.  Gastrointestinal:  Negative for abdominal distention, abdominal pain, constipation, diarrhea, nausea and vomiting.  Musculoskeletal:  Negative for back pain and gait problem.  Skin:  Negative for rash.  Psychiatric/Behavioral:  Negative for dysphoric mood and sleep  disturbance. The patient is not nervous/anxious.   All other systems reviewed and are negative.  Per HPI unless specifically indicated above   Allergies as of 11/15/2020       Reactions   Sulfa Antibiotics Other (See Comments)   High potassium   Ramipril Cough   Versed [midazolam] Other (See Comments)   "I don't wake up very good or clear it out of my system"        Medication List        Accurate as of November 15, 2020  9:25 AM. If you have any questions, ask your nurse or doctor.          STOP taking these medications    hydrALAZINE 25 MG tablet Commonly known as: APRESOLINE Stopped by: Fransisca Kaufmann Lafreda Casebeer, MD       TAKE these medications    acetaminophen 500 MG tablet Commonly known as: TYLENOL Take 1,000 mg by mouth 2 (two) times daily.   acyclovir 400 MG tablet Commonly known as: ZOVIRAX Takes mondays, wednesdays and fridays   AgaMatrix Ultra-Thin Lancets Misc TEST BS 4 TIMES A DAY AND AS NEEDED DX E10.65   albuterol 108 (90 Base) MCG/ACT inhaler Commonly known as: VENTOLIN HFA Inhale 2 puffs into the lungs every 6 (six) hours as needed for wheezing or shortness of breath.   albuterol (2.5 MG/3ML) 0.083% nebulizer solution Commonly known as: PROVENTIL Take 3 mLs (2.5 mg total) by nebulization every 6 (six) hours as  needed for wheezing or shortness of breath.   amLODipine 10 MG tablet Commonly known as: NORVASC Take 10 mg by mouth at bedtime as needed. What changed: Another medication with the same name was removed. Continue taking this medication, and follow the directions you see here. Changed by: Worthy Rancher, MD   aspirin 81 MG chewable tablet Chew 81 mg by mouth every morning.   budesonide-formoterol 160-4.5 MCG/ACT inhaler Commonly known as: Symbicort Inhale 2 puffs into the lungs in the morning and at bedtime.   cyclobenzaprine 10 MG tablet Commonly known as: FLEXERIL TAKE 1 TABLET BY MOUTH THREE TIMES A DAY AS NEEDED FOR MUSCLE  SPASMS   diclofenac sodium 1 % Gel Commonly known as: VOLTAREN Apply 2 g topically 4 (four) times daily.   diphenoxylate-atropine 2.5-0.025 MG tablet Commonly known as: Lomotil Take 1 tablet by mouth 4 (four) times daily as needed for diarrhea or loose stools.   Dovato 50-300 MG tablet Generic drug: dolutegravir-lamiVUDine Take 1 tablet by mouth daily.   DULoxetine 60 MG capsule Commonly known as: CYMBALTA Take 1 capsule (60 mg total) by mouth daily. Take with 30 mg fot a total of 90mg    DULoxetine 30 MG capsule Commonly known as: CYMBALTA Take 1 capsule (30 mg total) by mouth daily. TAKE WITH THE 60MG  FOR TOTAL OF 90MG    esomeprazole 40 MG capsule Commonly known as: NEXIUM Take 1 capsule (40 mg total) by mouth daily. What changed: how much to take Changed by: Worthy Rancher, MD   febuxostat 40 MG tablet Commonly known as: ULORIC Take 1 tablet (40 mg total) by mouth daily.   ferrous sulfate 325 (65 FE) MG tablet Take 650 mg by mouth daily with breakfast.   fluticasone 50 MCG/ACT nasal spray Commonly known as: FLONASE SPRAY 2 SPRAYS INTO EACH NOSTRIL EVERY DAY   furosemide 40 MG tablet Commonly known as: LASIX Take 40 mg by mouth daily. 40 mg nightly   GLUCOSAMINE 1500 COMPLEX PO Take 1 tablet by mouth 2 (two) times daily.   glucosamine-chondroitin 500-400 MG tablet Take by mouth.   glucose blood test strip Commonly known as: OneTouch Verio TEST BLOOD SUGAR 4 TIMES DAILY AND AS NEEDED   HYDROcodone bit-homatropine 5-1.5 MG/5ML syrup Commonly known as: HYCODAN Take 5 mLs by mouth every 6 (six) hours as needed for cough.   hydrOXYzine 25 MG tablet Commonly known as: ATARAX/VISTARIL Take 1 tablet (25 mg total) by mouth every 8 (eight) hours as needed.   hyoscyamine 0.125 MG tablet Commonly known as: LEVSIN TAKE 1 TABLET (0.125 MG TOTAL) BY MOUTH EVERY 4 (FOUR) HOURS AS NEEDED.   icosapent Ethyl 1 g capsule Commonly known as: VASCEPA Take 2 capsules  (2 g total) by mouth 2 (two) times daily.   insulin glargine 100 UNIT/ML injection Commonly known as: LANTUS In the event of insulin pump failure, inject 8 units twice daily   insulin lispro 100 UNIT/ML injection Commonly known as: HumaLOG USE 42 UNITS TO 120 UNITS PER PUMP DAILY AS DIRECTED   ipratropium 0.03 % nasal spray Commonly known as: ATROVENT USE 2 SPRAYS IN EACH NOSTRIL 2-3 TIMES DAILY   levocetirizine 5 MG tablet Commonly known as: XYZAL Take 1 tablet (5 mg total) by mouth every evening.   levothyroxine 200 MCG tablet Commonly known as: SYNTHROID Take 1 tablet (200 mcg total) by mouth daily.   lipase/protease/amylase 12000-38000 units Cpep capsule Commonly known as: CREON Take 2 capsules prior to meals and 1 capsule prior to  snacks   LORazepam 0.5 MG tablet Commonly known as: ATIVAN Take by mouth.   losartan 100 MG tablet Commonly known as: COZAAR Take 100 mg by mouth daily. What changed: Another medication with the same name was removed. Continue taking this medication, and follow the directions you see here. Changed by: Worthy Rancher, MD   meclizine 12.5 MG tablet Commonly known as: ANTIVERT Take 1 tablet (12.5 mg total) by mouth 3 (three) times daily as needed for dizziness.   metoCLOPramide 5 MG tablet Commonly known as: REGLAN Take 1 tablet (5 mg total) by mouth 3 (three) times daily before meals.   Misc Intestinal Flora Regulat Caps Take 1 capsule by mouth every morning.   montelukast 10 MG tablet Commonly known as: SINGULAIR Take 1 tablet (10 mg total) by mouth at bedtime.   niacin 1000 MG CR tablet Commonly known as: NIASPAN TAKE 1 TABLET (1,000 MG TOTAL) BY MOUTH AT BEDTIME.   ondansetron 4 MG tablet Commonly known as: ZOFRAN TAKE 1 TABLET BY MOUTH EVERY 8 HOURS AS NEEDED FOR NAUSEA   promethazine 25 MG suppository Commonly known as: Phenergan Place 1 suppository (25 mg total) rectally every 6 (six) hours as needed for nausea or  vomiting.   rosuvastatin 20 MG tablet Commonly known as: CRESTOR Take 20 mg by mouth at bedtime.   sevelamer carbonate 800 MG tablet Commonly known as: RENVELA Take 800 mg by mouth 3 (three) times daily. 2 tabs before each meal   tamsulosin 0.4 MG Caps capsule Commonly known as: FLOMAX Take 0.4 mg by mouth daily.   Testosterone 20.25 MG/ACT (1.62%) Gel APPLY 3 PUMPS DAILY AS DIRECTED   traMADol 50 MG tablet Commonly known as: ULTRAM Take 1 tablet (50 mg total) by mouth daily as needed.   traZODone 50 MG tablet Commonly known as: DESYREL Take by mouth.         Objective:   BP (!) 158/75   Pulse 84   Ht 5\' 10"  (1.778 m)   Wt 214 lb (97.1 kg)   SpO2 97%   BMI 30.71 kg/m   Wt Readings from Last 3 Encounters:  11/15/20 214 lb (97.1 kg)  10/23/20 210 lb 6.4 oz (95.4 kg)  09/06/20 212 lb (96.2 kg)    Physical Exam Vitals and nursing note reviewed.  Constitutional:      General: He is not in acute distress.    Appearance: He is well-developed. He is not diaphoretic.  Eyes:     General: No scleral icterus.    Conjunctiva/sclera: Conjunctivae normal.  Neck:     Thyroid: No thyromegaly.  Cardiovascular:     Rate and Rhythm: Normal rate and regular rhythm.     Heart sounds: Normal heart sounds. No murmur heard. Pulmonary:     Effort: Pulmonary effort is normal. No respiratory distress.     Breath sounds: Normal breath sounds. No wheezing.  Musculoskeletal:        General: No swelling. Normal range of motion.     Cervical back: Neck supple.  Lymphadenopathy:     Cervical: No cervical adenopathy.  Skin:    General: Skin is warm and dry.     Findings: No rash.  Neurological:     Mental Status: He is alert and oriented to person, place, and time.     Coordination: Coordination normal.  Psychiatric:        Behavior: Behavior normal.    Patient has a little cough and congestion but lungs are clear.  Assessment & Plan:   Problem List Items Addressed This  Visit       Endocrine   Type 1 diabetes mellitus ([place])   Acquired hypothyroidism   Stable treated proliferative diabetic retinopathy of right eye determined by examination associated with type 1 diabetes mellitus ([place])   Stable treated proliferative diabetic retinopathy of left eye without macular edema determined by examination associated with type 1 diabetes mellitus ([place])     Genitourinary   ESRD (end stage renal disease) on dialysis ([place])   Other Visit Diagnoses     Need for immunization against influenza    -  Primary   Relevant Orders   Flu Vaccine QUAD 45mo+IM (Fluarix, Fluzone & Alfiuria Quad PF) (Completed)       Continue current medicine.  No changes.  Patient is going to endocrinology later today so they will do blood work so we will not do blood work here today. Follow up plan: Return in about 3 months (around 02/15/2021), or if symptoms worsen or fail to improve, for Diabetes and anxiety..  Counseling provided for all of the vaccine components Orders Placed This Encounter  Procedures   Flu Vaccine QUAD 16mo+IM (Fluarix, Fluzone & Alfiuria Quad PF)    Caryl Pina, MD [facility] 11/15/2020, 9:25 AM

## 2020-11-16 DIAGNOSIS — E8779 Other fluid overload: Secondary | ICD-10-CM | POA: Diagnosis not present

## 2020-11-16 DIAGNOSIS — N2581 Secondary hyperparathyroidism of renal origin: Secondary | ICD-10-CM | POA: Diagnosis not present

## 2020-11-16 DIAGNOSIS — D509 Iron deficiency anemia, unspecified: Secondary | ICD-10-CM | POA: Diagnosis not present

## 2020-11-16 DIAGNOSIS — D631 Anemia in chronic kidney disease: Secondary | ICD-10-CM | POA: Diagnosis not present

## 2020-11-16 DIAGNOSIS — N186 End stage renal disease: Secondary | ICD-10-CM | POA: Diagnosis not present

## 2020-11-18 DIAGNOSIS — D631 Anemia in chronic kidney disease: Secondary | ICD-10-CM | POA: Diagnosis not present

## 2020-11-18 DIAGNOSIS — E8779 Other fluid overload: Secondary | ICD-10-CM | POA: Diagnosis not present

## 2020-11-18 DIAGNOSIS — N186 End stage renal disease: Secondary | ICD-10-CM | POA: Diagnosis not present

## 2020-11-18 DIAGNOSIS — D509 Iron deficiency anemia, unspecified: Secondary | ICD-10-CM | POA: Diagnosis not present

## 2020-11-18 DIAGNOSIS — N2581 Secondary hyperparathyroidism of renal origin: Secondary | ICD-10-CM | POA: Diagnosis not present

## 2020-11-21 DIAGNOSIS — N2581 Secondary hyperparathyroidism of renal origin: Secondary | ICD-10-CM | POA: Diagnosis not present

## 2020-11-21 DIAGNOSIS — D631 Anemia in chronic kidney disease: Secondary | ICD-10-CM | POA: Diagnosis not present

## 2020-11-21 DIAGNOSIS — D509 Iron deficiency anemia, unspecified: Secondary | ICD-10-CM | POA: Diagnosis not present

## 2020-11-21 DIAGNOSIS — N186 End stage renal disease: Secondary | ICD-10-CM | POA: Diagnosis not present

## 2020-11-21 DIAGNOSIS — E8779 Other fluid overload: Secondary | ICD-10-CM | POA: Diagnosis not present

## 2020-11-22 DIAGNOSIS — L720 Epidermal cyst: Secondary | ICD-10-CM | POA: Diagnosis not present

## 2020-11-23 DIAGNOSIS — N2581 Secondary hyperparathyroidism of renal origin: Secondary | ICD-10-CM | POA: Diagnosis not present

## 2020-11-23 DIAGNOSIS — N186 End stage renal disease: Secondary | ICD-10-CM | POA: Diagnosis not present

## 2020-11-23 DIAGNOSIS — D631 Anemia in chronic kidney disease: Secondary | ICD-10-CM | POA: Diagnosis not present

## 2020-11-23 DIAGNOSIS — E8779 Other fluid overload: Secondary | ICD-10-CM | POA: Diagnosis not present

## 2020-11-23 DIAGNOSIS — D509 Iron deficiency anemia, unspecified: Secondary | ICD-10-CM | POA: Diagnosis not present

## 2020-11-24 DIAGNOSIS — D510 Vitamin B12 deficiency anemia due to intrinsic factor deficiency: Secondary | ICD-10-CM | POA: Diagnosis not present

## 2020-11-25 DIAGNOSIS — D631 Anemia in chronic kidney disease: Secondary | ICD-10-CM | POA: Diagnosis not present

## 2020-11-25 DIAGNOSIS — E8779 Other fluid overload: Secondary | ICD-10-CM | POA: Diagnosis not present

## 2020-11-25 DIAGNOSIS — N2581 Secondary hyperparathyroidism of renal origin: Secondary | ICD-10-CM | POA: Diagnosis not present

## 2020-11-25 DIAGNOSIS — D509 Iron deficiency anemia, unspecified: Secondary | ICD-10-CM | POA: Diagnosis not present

## 2020-11-25 DIAGNOSIS — N186 End stage renal disease: Secondary | ICD-10-CM | POA: Diagnosis not present

## 2020-11-28 DIAGNOSIS — N186 End stage renal disease: Secondary | ICD-10-CM | POA: Diagnosis not present

## 2020-11-28 DIAGNOSIS — E8779 Other fluid overload: Secondary | ICD-10-CM | POA: Diagnosis not present

## 2020-11-28 DIAGNOSIS — N2581 Secondary hyperparathyroidism of renal origin: Secondary | ICD-10-CM | POA: Diagnosis not present

## 2020-11-28 DIAGNOSIS — D509 Iron deficiency anemia, unspecified: Secondary | ICD-10-CM | POA: Diagnosis not present

## 2020-11-28 DIAGNOSIS — D631 Anemia in chronic kidney disease: Secondary | ICD-10-CM | POA: Diagnosis not present

## 2020-11-30 DIAGNOSIS — E8779 Other fluid overload: Secondary | ICD-10-CM | POA: Diagnosis not present

## 2020-11-30 DIAGNOSIS — N2581 Secondary hyperparathyroidism of renal origin: Secondary | ICD-10-CM | POA: Diagnosis not present

## 2020-11-30 DIAGNOSIS — D509 Iron deficiency anemia, unspecified: Secondary | ICD-10-CM | POA: Diagnosis not present

## 2020-11-30 DIAGNOSIS — D631 Anemia in chronic kidney disease: Secondary | ICD-10-CM | POA: Diagnosis not present

## 2020-11-30 DIAGNOSIS — N186 End stage renal disease: Secondary | ICD-10-CM | POA: Diagnosis not present

## 2020-12-02 DIAGNOSIS — E8779 Other fluid overload: Secondary | ICD-10-CM | POA: Diagnosis not present

## 2020-12-02 DIAGNOSIS — D509 Iron deficiency anemia, unspecified: Secondary | ICD-10-CM | POA: Diagnosis not present

## 2020-12-02 DIAGNOSIS — N2581 Secondary hyperparathyroidism of renal origin: Secondary | ICD-10-CM | POA: Diagnosis not present

## 2020-12-02 DIAGNOSIS — D631 Anemia in chronic kidney disease: Secondary | ICD-10-CM | POA: Diagnosis not present

## 2020-12-02 DIAGNOSIS — N186 End stage renal disease: Secondary | ICD-10-CM | POA: Diagnosis not present

## 2020-12-04 DIAGNOSIS — N186 End stage renal disease: Secondary | ICD-10-CM | POA: Diagnosis not present

## 2020-12-04 DIAGNOSIS — Z992 Dependence on renal dialysis: Secondary | ICD-10-CM | POA: Diagnosis not present

## 2020-12-05 DIAGNOSIS — D509 Iron deficiency anemia, unspecified: Secondary | ICD-10-CM | POA: Diagnosis not present

## 2020-12-05 DIAGNOSIS — E8779 Other fluid overload: Secondary | ICD-10-CM | POA: Diagnosis not present

## 2020-12-05 DIAGNOSIS — N2581 Secondary hyperparathyroidism of renal origin: Secondary | ICD-10-CM | POA: Diagnosis not present

## 2020-12-05 DIAGNOSIS — N186 End stage renal disease: Secondary | ICD-10-CM | POA: Diagnosis not present

## 2020-12-05 DIAGNOSIS — D631 Anemia in chronic kidney disease: Secondary | ICD-10-CM | POA: Diagnosis not present

## 2020-12-07 DIAGNOSIS — E8779 Other fluid overload: Secondary | ICD-10-CM | POA: Diagnosis not present

## 2020-12-07 DIAGNOSIS — N186 End stage renal disease: Secondary | ICD-10-CM | POA: Diagnosis not present

## 2020-12-07 DIAGNOSIS — E119 Type 2 diabetes mellitus without complications: Secondary | ICD-10-CM | POA: Diagnosis not present

## 2020-12-07 DIAGNOSIS — D509 Iron deficiency anemia, unspecified: Secondary | ICD-10-CM | POA: Diagnosis not present

## 2020-12-07 DIAGNOSIS — N2581 Secondary hyperparathyroidism of renal origin: Secondary | ICD-10-CM | POA: Diagnosis not present

## 2020-12-07 DIAGNOSIS — D631 Anemia in chronic kidney disease: Secondary | ICD-10-CM | POA: Diagnosis not present

## 2020-12-09 DIAGNOSIS — D631 Anemia in chronic kidney disease: Secondary | ICD-10-CM | POA: Diagnosis not present

## 2020-12-09 DIAGNOSIS — D509 Iron deficiency anemia, unspecified: Secondary | ICD-10-CM | POA: Diagnosis not present

## 2020-12-09 DIAGNOSIS — N186 End stage renal disease: Secondary | ICD-10-CM | POA: Diagnosis not present

## 2020-12-09 DIAGNOSIS — N2581 Secondary hyperparathyroidism of renal origin: Secondary | ICD-10-CM | POA: Diagnosis not present

## 2020-12-09 DIAGNOSIS — E8779 Other fluid overload: Secondary | ICD-10-CM | POA: Diagnosis not present

## 2020-12-12 DIAGNOSIS — E8779 Other fluid overload: Secondary | ICD-10-CM | POA: Diagnosis not present

## 2020-12-12 DIAGNOSIS — N186 End stage renal disease: Secondary | ICD-10-CM | POA: Diagnosis not present

## 2020-12-12 DIAGNOSIS — D631 Anemia in chronic kidney disease: Secondary | ICD-10-CM | POA: Diagnosis not present

## 2020-12-12 DIAGNOSIS — N2581 Secondary hyperparathyroidism of renal origin: Secondary | ICD-10-CM | POA: Diagnosis not present

## 2020-12-12 DIAGNOSIS — D509 Iron deficiency anemia, unspecified: Secondary | ICD-10-CM | POA: Diagnosis not present

## 2020-12-14 DIAGNOSIS — N2581 Secondary hyperparathyroidism of renal origin: Secondary | ICD-10-CM | POA: Diagnosis not present

## 2020-12-14 DIAGNOSIS — N186 End stage renal disease: Secondary | ICD-10-CM | POA: Diagnosis not present

## 2020-12-14 DIAGNOSIS — D509 Iron deficiency anemia, unspecified: Secondary | ICD-10-CM | POA: Diagnosis not present

## 2020-12-14 DIAGNOSIS — E8779 Other fluid overload: Secondary | ICD-10-CM | POA: Diagnosis not present

## 2020-12-14 DIAGNOSIS — D631 Anemia in chronic kidney disease: Secondary | ICD-10-CM | POA: Diagnosis not present

## 2020-12-16 DIAGNOSIS — D631 Anemia in chronic kidney disease: Secondary | ICD-10-CM | POA: Diagnosis not present

## 2020-12-16 DIAGNOSIS — N186 End stage renal disease: Secondary | ICD-10-CM | POA: Diagnosis not present

## 2020-12-16 DIAGNOSIS — N2581 Secondary hyperparathyroidism of renal origin: Secondary | ICD-10-CM | POA: Diagnosis not present

## 2020-12-16 DIAGNOSIS — E8779 Other fluid overload: Secondary | ICD-10-CM | POA: Diagnosis not present

## 2020-12-16 DIAGNOSIS — D509 Iron deficiency anemia, unspecified: Secondary | ICD-10-CM | POA: Diagnosis not present

## 2020-12-19 DIAGNOSIS — D509 Iron deficiency anemia, unspecified: Secondary | ICD-10-CM | POA: Diagnosis not present

## 2020-12-19 DIAGNOSIS — E8779 Other fluid overload: Secondary | ICD-10-CM | POA: Diagnosis not present

## 2020-12-19 DIAGNOSIS — N186 End stage renal disease: Secondary | ICD-10-CM | POA: Diagnosis not present

## 2020-12-19 DIAGNOSIS — N2581 Secondary hyperparathyroidism of renal origin: Secondary | ICD-10-CM | POA: Diagnosis not present

## 2020-12-19 DIAGNOSIS — D631 Anemia in chronic kidney disease: Secondary | ICD-10-CM | POA: Diagnosis not present

## 2020-12-21 DIAGNOSIS — E8779 Other fluid overload: Secondary | ICD-10-CM | POA: Diagnosis not present

## 2020-12-21 DIAGNOSIS — D509 Iron deficiency anemia, unspecified: Secondary | ICD-10-CM | POA: Diagnosis not present

## 2020-12-21 DIAGNOSIS — D631 Anemia in chronic kidney disease: Secondary | ICD-10-CM | POA: Diagnosis not present

## 2020-12-21 DIAGNOSIS — N2581 Secondary hyperparathyroidism of renal origin: Secondary | ICD-10-CM | POA: Diagnosis not present

## 2020-12-21 DIAGNOSIS — N186 End stage renal disease: Secondary | ICD-10-CM | POA: Diagnosis not present

## 2020-12-22 DIAGNOSIS — E1022 Type 1 diabetes mellitus with diabetic chronic kidney disease: Secondary | ICD-10-CM | POA: Diagnosis not present

## 2020-12-22 DIAGNOSIS — N185 Chronic kidney disease, stage 5: Secondary | ICD-10-CM | POA: Diagnosis not present

## 2020-12-23 DIAGNOSIS — D631 Anemia in chronic kidney disease: Secondary | ICD-10-CM | POA: Diagnosis not present

## 2020-12-23 DIAGNOSIS — E8779 Other fluid overload: Secondary | ICD-10-CM | POA: Diagnosis not present

## 2020-12-23 DIAGNOSIS — D509 Iron deficiency anemia, unspecified: Secondary | ICD-10-CM | POA: Diagnosis not present

## 2020-12-23 DIAGNOSIS — N2581 Secondary hyperparathyroidism of renal origin: Secondary | ICD-10-CM | POA: Diagnosis not present

## 2020-12-23 DIAGNOSIS — N186 End stage renal disease: Secondary | ICD-10-CM | POA: Diagnosis not present

## 2020-12-24 ENCOUNTER — Other Ambulatory Visit: Payer: Self-pay | Admitting: Family Medicine

## 2020-12-24 DIAGNOSIS — J301 Allergic rhinitis due to pollen: Secondary | ICD-10-CM

## 2020-12-25 ENCOUNTER — Other Ambulatory Visit: Payer: Self-pay | Admitting: Family Medicine

## 2020-12-25 DIAGNOSIS — D631 Anemia in chronic kidney disease: Secondary | ICD-10-CM | POA: Diagnosis not present

## 2020-12-25 DIAGNOSIS — E8779 Other fluid overload: Secondary | ICD-10-CM | POA: Diagnosis not present

## 2020-12-25 DIAGNOSIS — D509 Iron deficiency anemia, unspecified: Secondary | ICD-10-CM | POA: Diagnosis not present

## 2020-12-25 DIAGNOSIS — N186 End stage renal disease: Secondary | ICD-10-CM | POA: Diagnosis not present

## 2020-12-25 DIAGNOSIS — N2581 Secondary hyperparathyroidism of renal origin: Secondary | ICD-10-CM | POA: Diagnosis not present

## 2020-12-26 NOTE — Telephone Encounter (Signed)
Ok to fill or ntbs?

## 2020-12-27 DIAGNOSIS — D631 Anemia in chronic kidney disease: Secondary | ICD-10-CM | POA: Diagnosis not present

## 2020-12-27 DIAGNOSIS — D509 Iron deficiency anemia, unspecified: Secondary | ICD-10-CM | POA: Diagnosis not present

## 2020-12-27 DIAGNOSIS — N186 End stage renal disease: Secondary | ICD-10-CM | POA: Diagnosis not present

## 2020-12-27 DIAGNOSIS — N2581 Secondary hyperparathyroidism of renal origin: Secondary | ICD-10-CM | POA: Diagnosis not present

## 2020-12-27 DIAGNOSIS — E8779 Other fluid overload: Secondary | ICD-10-CM | POA: Diagnosis not present

## 2020-12-29 DIAGNOSIS — D510 Vitamin B12 deficiency anemia due to intrinsic factor deficiency: Secondary | ICD-10-CM | POA: Diagnosis not present

## 2020-12-30 DIAGNOSIS — E8779 Other fluid overload: Secondary | ICD-10-CM | POA: Diagnosis not present

## 2020-12-30 DIAGNOSIS — N186 End stage renal disease: Secondary | ICD-10-CM | POA: Diagnosis not present

## 2020-12-30 DIAGNOSIS — N2581 Secondary hyperparathyroidism of renal origin: Secondary | ICD-10-CM | POA: Diagnosis not present

## 2020-12-30 DIAGNOSIS — D509 Iron deficiency anemia, unspecified: Secondary | ICD-10-CM | POA: Diagnosis not present

## 2020-12-30 DIAGNOSIS — D631 Anemia in chronic kidney disease: Secondary | ICD-10-CM | POA: Diagnosis not present

## 2021-01-01 ENCOUNTER — Ambulatory Visit (INDEPENDENT_AMBULATORY_CARE_PROVIDER_SITE_OTHER): Payer: Medicare Other

## 2021-01-01 ENCOUNTER — Ambulatory Visit (INDEPENDENT_AMBULATORY_CARE_PROVIDER_SITE_OTHER): Payer: Medicare Other | Admitting: Family

## 2021-01-01 ENCOUNTER — Encounter: Payer: Self-pay | Admitting: Family

## 2021-01-01 VITALS — BP 176/90 | HR 92 | Temp 98.5°F | Ht 70.0 in | Wt 219.2 lb

## 2021-01-01 DIAGNOSIS — G4733 Obstructive sleep apnea (adult) (pediatric): Secondary | ICD-10-CM

## 2021-01-01 DIAGNOSIS — B20 Human immunodeficiency virus [HIV] disease: Secondary | ICD-10-CM

## 2021-01-01 DIAGNOSIS — N186 End stage renal disease: Secondary | ICD-10-CM | POA: Diagnosis not present

## 2021-01-01 DIAGNOSIS — R053 Chronic cough: Secondary | ICD-10-CM | POA: Diagnosis not present

## 2021-01-01 DIAGNOSIS — R059 Cough, unspecified: Secondary | ICD-10-CM | POA: Diagnosis not present

## 2021-01-01 DIAGNOSIS — Z992 Dependence on renal dialysis: Secondary | ICD-10-CM | POA: Diagnosis not present

## 2021-01-01 MED ORDER — PREDNISONE 20 MG PO TABS: 40.0000 mg | ORAL_TABLET | Freq: Every day | ORAL | 0 refills | Status: AC

## 2021-01-01 NOTE — Progress Notes (Signed)
Subjective:    Patient ID: Patrick Brown, male    DOB: August 17, 1970, 50 y.o.   MRN: [id number]  Chief Complaint  Patient presents with   Cough    X 2 mths cough is always changing . Dylsum and hydrocodone cough syrup. Doing breathing treatments.    Pt presents to the office today with a cough that has been on going for two months. He has taken cough syrup, tessalon, amoxicillin for 7 days, and albuterol.   He is followed by Pulmonologist for  OSA and cough. He is taking Symbicort BID and Singulair 10 mg.    He also has HIV and ESRD and on dialysis.  Cough This is a new problem. The current episode started more than 1 month ago. The problem occurs every few minutes. The cough is Non-productive. Associated symptoms include nasal congestion and wheezing. Pertinent negatives include no chills, ear congestion, ear pain, fever, headaches, myalgias or shortness of breath. The symptoms are aggravated by lying down. He has tried rest and OTC cough suppressant for the symptoms. The treatment provided mild relief.     Review of Systems  Constitutional:  Negative for chills and fever.  HENT:  Negative for ear pain.   Respiratory:  Positive for cough and wheezing. Negative for shortness of breath.   Musculoskeletal:  Negative for myalgias.  Neurological:  Negative for headaches.  All other systems reviewed and are negative.     Objective:   Physical Exam Vitals reviewed.  Constitutional:      General: He is not in acute distress.    Appearance: He is well-developed. He is obese.  HENT:     Head: Normocephalic.     Right Ear: Tympanic membrane normal.     Left Ear: Tympanic membrane normal.  Eyes:     General:        Right eye: No discharge.        Left eye: No discharge.     Pupils: Pupils are equal, round, and reactive to light.  Neck:     Thyroid: No thyromegaly.  Cardiovascular:     Rate and Rhythm: Normal rate and regular rhythm.     Heart sounds: Normal heart sounds. No murmur  heard. Pulmonary:     Effort: Pulmonary effort is normal. No respiratory distress.     Breath sounds: Rhonchi present. No wheezing.  Abdominal:     General: Bowel sounds are normal. There is no distension.     Palpations: Abdomen is soft.     Tenderness: There is no abdominal tenderness.  Musculoskeletal:        General: No tenderness. Normal range of motion.     Cervical back: Normal range of motion and neck supple.  Skin:    General: Skin is warm and dry.     Findings: No erythema or rash.  Neurological:     Mental Status: He is alert and oriented to person, place, and time.     Cranial Nerves: No cranial nerve deficit.     Deep Tendon Reflexes: Reflexes are normal and symmetric.  Psychiatric:        Behavior: Behavior normal.        Thought Content: Thought content normal.        Judgment: Judgment normal.     BP (!) 176/90   Pulse 92   Temp 98.5 F (36.9 C) (Temporal)   Ht 5\' 10"  (1.778 m)   Wt 219 lb 3.2 oz (99.4 kg)   BMI  31.45 kg/m       Assessment & Plan:  [name] comes in today with chief complaint of Cough (X 2 mths cough is always changing . Dylsum and hydrocodone cough syrup. Doing breathing treatments. )   Diagnosis and orders addressed:  1. Chronic cough  - DG Chest 2 View - predniSONE (DELTASONE) 20 MG tablet; Take 2 tablets (40 mg total) by mouth daily with breakfast for 5 days.  Dispense: 10 tablet; Refill: 0  2. OSA (obstructive sleep apnea)  - DG Chest 2 View - predniSONE (DELTASONE) 20 MG tablet; Take 2 tablets (40 mg total) by mouth daily with breakfast for 5 days.  Dispense: 10 tablet; Refill: 0   3. HIV disease ([name])   4. ESRD (end stage renal disease) on dialysis ([name])    Continue Symbicort, Singulair, albuterol Keep pulmonologist, ID, and nephrologists follow up.    Evelina Dun, FNP

## 2021-01-01 NOTE — Patient Instructions (Signed)

## 2021-01-02 ENCOUNTER — Other Ambulatory Visit: Payer: Self-pay | Admitting: Internal Medicine

## 2021-01-02 DIAGNOSIS — N2581 Secondary hyperparathyroidism of renal origin: Secondary | ICD-10-CM | POA: Diagnosis not present

## 2021-01-02 DIAGNOSIS — B20 Human immunodeficiency virus [HIV] disease: Secondary | ICD-10-CM

## 2021-01-02 DIAGNOSIS — N186 End stage renal disease: Secondary | ICD-10-CM | POA: Diagnosis not present

## 2021-01-02 DIAGNOSIS — D631 Anemia in chronic kidney disease: Secondary | ICD-10-CM | POA: Diagnosis not present

## 2021-01-02 DIAGNOSIS — D509 Iron deficiency anemia, unspecified: Secondary | ICD-10-CM | POA: Diagnosis not present

## 2021-01-02 DIAGNOSIS — E8779 Other fluid overload: Secondary | ICD-10-CM | POA: Diagnosis not present

## 2021-01-03 DIAGNOSIS — Z992 Dependence on renal dialysis: Secondary | ICD-10-CM | POA: Diagnosis not present

## 2021-01-03 DIAGNOSIS — N186 End stage renal disease: Secondary | ICD-10-CM | POA: Diagnosis not present

## 2021-01-04 ENCOUNTER — Encounter: Payer: Self-pay | Admitting: Pulmonary Disease

## 2021-01-04 DIAGNOSIS — N2581 Secondary hyperparathyroidism of renal origin: Secondary | ICD-10-CM | POA: Diagnosis not present

## 2021-01-04 DIAGNOSIS — D509 Iron deficiency anemia, unspecified: Secondary | ICD-10-CM | POA: Diagnosis not present

## 2021-01-04 DIAGNOSIS — N186 End stage renal disease: Secondary | ICD-10-CM | POA: Diagnosis not present

## 2021-01-04 DIAGNOSIS — E8779 Other fluid overload: Secondary | ICD-10-CM | POA: Diagnosis not present

## 2021-01-04 DIAGNOSIS — D631 Anemia in chronic kidney disease: Secondary | ICD-10-CM | POA: Diagnosis not present

## 2021-01-04 NOTE — Telephone Encounter (Signed)
If he is still have trouble with his cough, then he needs an ROV to reassess therapy.

## 2021-01-06 DIAGNOSIS — D631 Anemia in chronic kidney disease: Secondary | ICD-10-CM | POA: Diagnosis not present

## 2021-01-06 DIAGNOSIS — D509 Iron deficiency anemia, unspecified: Secondary | ICD-10-CM | POA: Diagnosis not present

## 2021-01-06 DIAGNOSIS — N186 End stage renal disease: Secondary | ICD-10-CM | POA: Diagnosis not present

## 2021-01-06 DIAGNOSIS — E8779 Other fluid overload: Secondary | ICD-10-CM | POA: Diagnosis not present

## 2021-01-06 DIAGNOSIS — N2581 Secondary hyperparathyroidism of renal origin: Secondary | ICD-10-CM | POA: Diagnosis not present

## 2021-01-09 DIAGNOSIS — N186 End stage renal disease: Secondary | ICD-10-CM | POA: Diagnosis not present

## 2021-01-09 DIAGNOSIS — D509 Iron deficiency anemia, unspecified: Secondary | ICD-10-CM | POA: Diagnosis not present

## 2021-01-09 DIAGNOSIS — E8779 Other fluid overload: Secondary | ICD-10-CM | POA: Diagnosis not present

## 2021-01-09 DIAGNOSIS — N2581 Secondary hyperparathyroidism of renal origin: Secondary | ICD-10-CM | POA: Diagnosis not present

## 2021-01-09 DIAGNOSIS — D631 Anemia in chronic kidney disease: Secondary | ICD-10-CM | POA: Diagnosis not present

## 2021-01-11 ENCOUNTER — Ambulatory Visit (INDEPENDENT_AMBULATORY_CARE_PROVIDER_SITE_OTHER): Payer: Medicare Other | Admitting: Family Medicine

## 2021-01-11 ENCOUNTER — Ambulatory Visit: Payer: Medicare Other | Admitting: Adult Health

## 2021-01-11 ENCOUNTER — Encounter: Payer: Self-pay | Admitting: Family Medicine

## 2021-01-11 VITALS — BP 197/93 | HR 79 | Temp 98.2°F | Resp 20 | Ht 70.0 in | Wt 214.0 lb

## 2021-01-11 DIAGNOSIS — D509 Iron deficiency anemia, unspecified: Secondary | ICD-10-CM | POA: Diagnosis not present

## 2021-01-11 DIAGNOSIS — N2581 Secondary hyperparathyroidism of renal origin: Secondary | ICD-10-CM | POA: Diagnosis not present

## 2021-01-11 DIAGNOSIS — E119 Type 2 diabetes mellitus without complications: Secondary | ICD-10-CM | POA: Diagnosis not present

## 2021-01-11 DIAGNOSIS — D631 Anemia in chronic kidney disease: Secondary | ICD-10-CM | POA: Diagnosis not present

## 2021-01-11 DIAGNOSIS — E8779 Other fluid overload: Secondary | ICD-10-CM | POA: Diagnosis not present

## 2021-01-11 DIAGNOSIS — N186 End stage renal disease: Secondary | ICD-10-CM | POA: Diagnosis not present

## 2021-01-11 DIAGNOSIS — J40 Bronchitis, not specified as acute or chronic: Secondary | ICD-10-CM

## 2021-01-11 MED ORDER — DOXYCYCLINE HYCLATE 100 MG PO TABS
100.0000 mg | ORAL_TABLET | Freq: Two times a day (BID) | ORAL | 0 refills | Status: DC
Start: 2021-01-11 — End: 2021-02-21

## 2021-01-11 MED ORDER — METHYLPREDNISOLONE ACETATE 80 MG/ML IJ SUSP
80.0000 mg | Freq: Once | INTRAMUSCULAR | Status: AC
Start: 2021-01-11 — End: 2021-01-11
  Administered 2021-01-11: 80 mg via INTRAMUSCULAR

## 2021-01-11 MED ORDER — METHYLPREDNISOLONE ACETATE 40 MG/ML IJ SUSP
80.0000 mg | Freq: Once | INTRAMUSCULAR | Status: DC
Start: 2021-01-11 — End: 2021-01-11

## 2021-01-11 MED ORDER — PREDNISONE 20 MG PO TABS: ORAL_TABLET | ORAL | 0 refills | Status: DC

## 2021-01-11 MED ORDER — CEFTRIAXONE SODIUM 1 G IJ SOLR
1.0000 g | Freq: Once | INTRAMUSCULAR | Status: AC
Start: 2021-01-11 — End: 2021-01-11
  Administered 2021-01-11: 1 g via INTRAMUSCULAR

## 2021-01-11 NOTE — Progress Notes (Signed)
BP (!) 197/93   Pulse 79   Temp 98.2 F (36.8 C)   Resp 20   Ht 5\' 10"  (1.778 m)   Wt 214 lb (97.1 kg)   SpO2 94%   BMI 30.71 kg/m    Subjective:   Patient ID: Patrick Brown, male    DOB: 1970-03-09, 50 y.o.   MRN: [id number]  HPI: Patrick Brown is a 50 y.o. male presenting on 01/11/2021 for Cough   HPI Patient presents today for cough and congestion.  He says the cough is been coming on and off since September and he has been on a couple of rounds of antibiotics and steroids which helped while he is on them and then returned.  He does have a pulmonologist appointment next week.  He denies any fevers or chills but is just not feeling well and the cough seems to be worsening.  He is using his inhalers and has tried multiple over-the-counter agents including Delsym and nebulizers Tessalon Perles honey cough medicine over-the-counter and steroids from prescription.  Seems to improve things but never clear them.  Patient is immunosuppressed patient with HIV and AIDS positive although he is controlled currently on his antiretrovirals.  Relevant past medical, surgical, family and social history reviewed and updated as indicated. Interim medical history since our last visit reviewed. Allergies and medications reviewed and updated.  Review of Systems  Constitutional:  Negative for chills and fever.  HENT:  Positive for congestion, postnasal drip, rhinorrhea, sneezing and sore throat. Negative for ear discharge, ear pain, sinus pressure and voice change.   Eyes:  Negative for pain, discharge, redness and visual disturbance.  Respiratory:  Positive for cough. Negative for shortness of breath and wheezing.   Cardiovascular:  Negative for chest pain and leg swelling.  Musculoskeletal:  Negative for gait problem.  Skin:  Negative for rash.  All other systems reviewed and are negative.  Per HPI unless specifically indicated above   Allergies as of 01/11/2021       Reactions   Sulfa  Antibiotics Other (See Comments)   High potassium   Ramipril Cough   Versed [midazolam] Other (See Comments)   "I don't wake up very good or clear it out of my system"        Medication List        Accurate as of January 11, 2021  2:27 PM. If you have any questions, ask your nurse or doctor.          STOP taking these medications    amoxicillin 500 MG capsule Commonly known as: AMOXIL Stopped by: Fransisca Kaufmann Markeith Jue, MD       TAKE these medications    acetaminophen 500 MG tablet Commonly known as: TYLENOL Take 1,000 mg by mouth 2 (two) times daily.   acyclovir 400 MG tablet Commonly known as: ZOVIRAX Takes mondays, wednesdays and fridays   AgaMatrix Ultra-Thin Lancets Misc TEST BS 4 TIMES A DAY AND AS NEEDED DX E10.65   albuterol 108 (90 Base) MCG/ACT inhaler Commonly known as: VENTOLIN HFA Inhale 2 puffs into the lungs every 6 (six) hours as needed for wheezing or shortness of breath.   albuterol (2.5 MG/3ML) 0.083% nebulizer solution Commonly known as: PROVENTIL Take 3 mLs (2.5 mg total) by nebulization every 6 (six) hours as needed for wheezing or shortness of breath.   amLODipine 10 MG tablet Commonly known as: NORVASC Take 10 mg by mouth at bedtime as needed.   aspirin 81 MG chewable tablet  Chew 81 mg by mouth every morning.   budesonide-formoterol 160-4.5 MCG/ACT inhaler Commonly known as: Symbicort Inhale 2 puffs into the lungs in the morning and at bedtime.   cyclobenzaprine 10 MG tablet Commonly known as: FLEXERIL TAKE 1 TABLET BY MOUTH THREE TIMES A DAY AS NEEDED FOR MUSCLE SPASMS   diclofenac sodium 1 % Gel Commonly known as: VOLTAREN Apply 2 g topically 4 (four) times daily.   diphenoxylate-atropine 2.5-0.025 MG tablet Commonly known as: Lomotil Take 1 tablet by mouth 4 (four) times daily as needed for diarrhea or loose stools.   Dovato 50-300 MG tablet Generic drug: dolutegravir-lamiVUDine TAKE 1 TABLET BY MOUTH DAILY    doxycycline 100 MG tablet Commonly known as: VIBRA-TABS Take 1 tablet (100 mg total) by mouth 2 (two) times daily. 1 po bid Started by: Fransisca Kaufmann Chrishaun Sasso, MD   DULoxetine 60 MG capsule Commonly known as: CYMBALTA Take 1 capsule (60 mg total) by mouth daily. Take with 30 mg fot a total of 90mg    DULoxetine 30 MG capsule Commonly known as: CYMBALTA Take 1 capsule (30 mg total) by mouth daily. TAKE WITH THE 60MG  FOR TOTAL OF 90MG    esomeprazole 40 MG capsule Commonly known as: NEXIUM Take 1 capsule (40 mg total) by mouth daily.   febuxostat 40 MG tablet Commonly known as: ULORIC Take 1 tablet (40 mg total) by mouth daily.   ferrous sulfate 325 (65 FE) MG tablet Take 650 mg by mouth daily with breakfast.   fluticasone 50 MCG/ACT nasal spray Commonly known as: FLONASE SPRAY 2 SPRAYS INTO EACH NOSTRIL EVERY DAY   furosemide 40 MG tablet Commonly known as: LASIX Take 40 mg by mouth daily. 40 mg nightly   GLUCOSAMINE 1500 COMPLEX PO Take 1 tablet by mouth 2 (two) times daily.   glucosamine-chondroitin 500-400 MG tablet Take by mouth.   glucose blood test strip Commonly known as: OneTouch Verio TEST BLOOD SUGAR 4 TIMES DAILY AND AS NEEDED   HYDROcodone bit-homatropine 5-1.5 MG/5ML syrup Commonly known as: HYCODAN Take 5 mLs by mouth every 6 (six) hours as needed for cough.   hydrOXYzine 25 MG tablet Commonly known as: ATARAX Take 1 tablet (25 mg total) by mouth every 8 (eight) hours as needed.   hyoscyamine 0.125 MG tablet Commonly known as: LEVSIN TAKE 1 TABLET (0.125 MG TOTAL) BY MOUTH EVERY 4 (FOUR) HOURS AS NEEDED.   icosapent Ethyl 1 g capsule Commonly known as: VASCEPA Take 2 capsules (2 g total) by mouth 2 (two) times daily.   insulin glargine 100 UNIT/ML injection Commonly known as: LANTUS In the event of insulin pump failure, inject 8 units twice daily   insulin lispro 100 UNIT/ML injection Commonly known as: HumaLOG USE 42 UNITS TO 120 UNITS PER  PUMP DAILY AS DIRECTED   ipratropium 0.03 % nasal spray Commonly known as: ATROVENT USE 2 SPRAYS IN EACH NOSTRIL 2-3 TIMES DAILY   levocetirizine 5 MG tablet Commonly known as: XYZAL Take 1 tablet (5 mg total) by mouth every evening.   levothyroxine 200 MCG tablet Commonly known as: SYNTHROID Take 1 tablet (200 mcg total) by mouth daily.   lipase/protease/amylase 12000-38000 units Cpep capsule Commonly known as: CREON Take 2 capsules prior to meals and 1 capsule prior to snacks   LORazepam 0.5 MG tablet Commonly known as: ATIVAN Take by mouth.   losartan 100 MG tablet Commonly known as: COZAAR Take 100 mg by mouth daily.   meclizine 12.5 MG tablet Commonly known as: ANTIVERT Take  1 tablet (12.5 mg total) by mouth 3 (three) times daily as needed for dizziness.   metoCLOPramide 5 MG tablet Commonly known as: REGLAN Take 1 tablet (5 mg total) by mouth 3 (three) times daily before meals.   Misc Intestinal Flora Regulat Caps Take 1 capsule by mouth every morning.   montelukast 10 MG tablet Commonly known as: SINGULAIR Take 1 tablet (10 mg total) by mouth at bedtime.   niacin 1000 MG CR tablet Commonly known as: NIASPAN TAKE 1 TABLET (1,000 MG TOTAL) BY MOUTH AT BEDTIME.   ondansetron 4 MG tablet Commonly known as: ZOFRAN TAKE 1 TABLET BY MOUTH EVERY 8 HOURS AS NEEDED FOR NAUSEA   predniSONE 20 MG tablet Commonly known as: DELTASONE 2 po at same time daily for 5 days Started by: Fransisca Kaufmann Shadara Lopez, MD   promethazine 25 MG suppository Commonly known as: PHENERGAN PLACE 1 SUPPOSITORY (25 MG TOTAL) RECTALLY EVERY 6 (SIX) HOURS AS NEEDED FOR NAUSEA OR VOMITING.   rosuvastatin 20 MG tablet Commonly known as: CRESTOR Take 20 mg by mouth at bedtime.   sevelamer carbonate 800 MG tablet Commonly known as: RENVELA Take 800 mg by mouth 3 (three) times daily. 2 tabs before each meal   tamsulosin 0.4 MG Caps capsule Commonly known as: FLOMAX Take 0.4 mg by mouth  daily.   Testosterone 20.25 MG/ACT (1.62%) Gel APPLY 3 PUMPS DAILY AS DIRECTED   traMADol 50 MG tablet Commonly known as: ULTRAM Take 1 tablet (50 mg total) by mouth daily as needed.   traZODone 50 MG tablet Commonly known as: DESYREL Take by mouth.         Objective:   BP (!) 197/93   Pulse 79   Temp 98.2 F (36.8 C)   Resp 20   Ht 5\' 10"  (1.778 m)   Wt 214 lb (97.1 kg)   SpO2 94%   BMI 30.71 kg/m   Wt Readings from Last 3 Encounters:  01/11/21 214 lb (97.1 kg)  01/01/21 219 lb 3.2 oz (99.4 kg)  11/15/20 214 lb (97.1 kg)    Physical Exam Vitals and nursing note reviewed.  Constitutional:      General: He is not in acute distress.    Appearance: He is well-developed. He is not diaphoretic.  HENT:     Mouth/Throat:     Mouth: Mucous membranes are moist.     Pharynx: Oropharynx is clear.  Eyes:     General: No scleral icterus.    Conjunctiva/sclera: Conjunctivae normal.  Neck:     Thyroid: No thyromegaly.  Cardiovascular:     Rate and Rhythm: Normal rate and regular rhythm.     Heart sounds: Normal heart sounds. No murmur heard. Pulmonary:     Effort: Pulmonary effort is normal. No respiratory distress.     Breath sounds: Normal breath sounds. No wheezing, rhonchi or rales.  Musculoskeletal:        General: Normal range of motion.     Cervical back: Neck supple.  Lymphadenopathy:     Cervical: No cervical adenopathy.  Skin:    General: Skin is warm and dry.     Findings: No rash.  Neurological:     Mental Status: He is alert and oriented to person, place, and time.     Coordination: Coordination normal.  Psychiatric:        Behavior: Behavior normal.      Assessment & Plan:   Problem List Items Addressed This Visit   None Visit Diagnoses  Bronchitis    -  Primary   Relevant Medications   doxycycline (VIBRA-TABS) 100 MG tablet   methylPREDNISolone acetate (DEPO-MEDROL) injection 80 mg (Start on 01/11/2021  2:30 PM)   cefTRIAXone  (ROCEPHIN) injection 1 g (Start on 01/11/2021  2:30 PM)   predniSONE (DELTASONE) 20 MG tablet     Patient has been fighting this for some time and tried ampicillin and other antibiotics and steroids as well, will give some injectable steroids and antibiotics today as a burst and then do another course of oral antibiotics a little bit stronger and see if he gets better.  Follow up plan: Return if symptoms worsen or fail to improve.  Counseling provided for all of the vaccine components No orders of the defined types were placed in this encounter.   Caryl Pina, MD [facility] 01/11/2021, 2:27 PM

## 2021-01-13 DIAGNOSIS — D509 Iron deficiency anemia, unspecified: Secondary | ICD-10-CM | POA: Diagnosis not present

## 2021-01-13 DIAGNOSIS — D631 Anemia in chronic kidney disease: Secondary | ICD-10-CM | POA: Diagnosis not present

## 2021-01-13 DIAGNOSIS — E8779 Other fluid overload: Secondary | ICD-10-CM | POA: Diagnosis not present

## 2021-01-13 DIAGNOSIS — N2581 Secondary hyperparathyroidism of renal origin: Secondary | ICD-10-CM | POA: Diagnosis not present

## 2021-01-13 DIAGNOSIS — N186 End stage renal disease: Secondary | ICD-10-CM | POA: Diagnosis not present

## 2021-01-15 ENCOUNTER — Telehealth: Payer: Self-pay | Admitting: Adult Health

## 2021-01-15 ENCOUNTER — Encounter: Payer: Self-pay | Admitting: Adult Health

## 2021-01-15 ENCOUNTER — Other Ambulatory Visit: Payer: Self-pay

## 2021-01-15 ENCOUNTER — Ambulatory Visit (INDEPENDENT_AMBULATORY_CARE_PROVIDER_SITE_OTHER): Payer: Medicare Other | Admitting: Adult Health

## 2021-01-15 DIAGNOSIS — G4733 Obstructive sleep apnea (adult) (pediatric): Secondary | ICD-10-CM | POA: Diagnosis not present

## 2021-01-15 DIAGNOSIS — B20 Human immunodeficiency virus [HIV] disease: Secondary | ICD-10-CM | POA: Diagnosis not present

## 2021-01-15 DIAGNOSIS — Z992 Dependence on renal dialysis: Secondary | ICD-10-CM | POA: Diagnosis not present

## 2021-01-15 DIAGNOSIS — N186 End stage renal disease: Secondary | ICD-10-CM | POA: Diagnosis not present

## 2021-01-15 DIAGNOSIS — J209 Acute bronchitis, unspecified: Secondary | ICD-10-CM

## 2021-01-15 NOTE — Assessment & Plan Note (Signed)
Cont w/ HD and follow up with Nephrology

## 2021-01-15 NOTE — Assessment & Plan Note (Signed)
Cont on current regimen and follow up with ID clinic

## 2021-01-15 NOTE — Telephone Encounter (Signed)
disregard

## 2021-01-15 NOTE — Patient Instructions (Addendum)
Finish Doxycycline and Prednisone as directed.  Delsym 2 tsp Twice daily  for cough As needed   Tessalon 200mg  Three times a day  For cough as needed.  Sips of water , honey to soothe throat and avoid throat clearing /coughing  Albuterol inhaler As needed   Continue on Symbicort 2 puffs Twice daily  rinse after use  Nexium daily  Xyzal daily  Follow up with Dr. Halford Chessman or Theophilus Walz NP in 4 weeks and As needed   Please contact office for sooner follow up if symptoms do not improve or worsen or seek emergency care

## 2021-01-15 NOTE — Assessment & Plan Note (Signed)
Recurrent flare -improving on course of antibitoics and steroids  CXR without acute process   Plan  Patient Instructions  Finish Doxycycline and Prednisone as directed.  Delsym 2 tsp Twice daily  for cough As needed   Tessalon 200mg  Three times a day  For cough as needed.  Sips of water , honey to soothe throat and avoid throat clearing /coughing  Albuterol inhaler As needed   Continue on Symbicort 2 puffs Twice daily  rinse after use  Nexium daily  Xyzal daily  Follow up with Dr. Halford Chessman or Nolberto Cheuvront NP in 4 weeks and As needed   Please contact office for sooner follow up if symptoms do not improve or worsen or seek emergency care

## 2021-01-15 NOTE — Assessment & Plan Note (Signed)
Cont on CPAP  

## 2021-01-15 NOTE — Progress Notes (Signed)
@Patient  ID: Patrick Brown, male    DOB: Aug 20, 1970, 50 y.o.   MRN: [id number]  Chief Complaint  Patient presents with   Follow-up    Referring provider: Dettinger, Fransisca Kaufmann, MD  HPI: 50 year old male never smoker with a complicated medical history including HIV disease and end-stage renal disease on dialysis. Patient is on dialysis Tuesday/Thursday/Saturday.  He is insulin-dependent diabetic with a insulin pump.  Has obstructive sleep apnea on CPAP.  History of PCP pneumonia  TEST/EVENTS :  PSG 09/26/14 >> AHI 11.3, SpO2 low 86%, PLMI 51.9  Underwent FOB 12/15/12 w/ essentially unremarbkable airway and  cytology neg for malignant cells, neg AFB and fungal cx. (final).   01/15/2021  Follow up : Acute bronchitis Patient returns for a follow-up visit.  Patient was recently seen at his primary care provider for an acute bronchitic exacerbation.  He was given doxycycline and a prednisone taper.  Patient says he has had recurrent episodes of bronchitis over the last few months.  This is his second antibiotic and prednisone course in the last 4 weeks.  He is currently taking doxycycline and prednisone.  Chest x-ray on January 01, 2021 showed no acute process.  He has had no associated fever or hemoptysis.  No orthopnea or edema.   Has been taking otc cough meds.  Since starting this course of antibiotics and steroids cough is better , starting to turn corner.  Remains on CPAP At bedtime  for OSA .     Allergies  Allergen Reactions   Sulfa Antibiotics Other (See Comments)    High potassium   Ramipril Cough   Versed [Midazolam] Other (See Comments)    "I don't wake up very good or clear it out of my system"    Immunization History  Administered Date(s) Administered   Hepatitis A 12/23/2013, 09/05/2014, 11/18/2019   Hepatitis A, Adult 12/23/2013, 09/05/2014   Hepatitis B, adult 12/23/2013, 02/01/2014, 09/05/2014   Hepatitis B, ped/adol 12/23/2013, 02/01/2014, 09/05/2014   Influenza  Split 11/05/2011   Influenza,inj,Quad PF,6+ Mos 11/15/2013, 11/28/2014, 11/10/2015, 11/13/2016, 10/27/2017, 10/07/2018, 11/26/2019, 11/15/2020   Influenza,inj,Quad PF,6-35 Mos 11/26/2019   Influenza-Unspecified 11/12/2012   Meningococcal Conjugate 03/26/2016, 10/27/2017   Meningococcal Mcv4o 03/26/2016, 10/27/2017   Moderna Covid-19 Vaccine Bivalent Booster 67yrs & up 12/08/2020   Moderna Sars-Covid-2 Vaccination 03/23/2019, 04/20/2019, 10/19/2019   Pneumococcal Conjugate-13 10/27/2017   Pneumococcal Polysaccharide-23 06/18/2012, 11/24/2018   Tdap 11/27/2010, 11/18/2019   Zoster Recombinat (Shingrix) 10/08/2019, 02/09/2020    Past Medical History:  Diagnosis Date   Anemia, iron deficiency On procrit   CAP (community acquired pneumonia)    CKD (chronic kidney disease) stage 3, GFR 30-59 ml/min (HCC)    Degenerative arthritis    Depression    Dyslipidemia    Gastroesophageal reflux disease    Gastroparesis diabeticorum ([place])    Hematuria, microscopic 10/09   work up negative (Dr. Amalia Hailey)   HIV positive ([place])    Hyperkalemia, diminished renal excretion 06/2011 secondary to TMP/SMZ; prior secondary to  ARBS;    Known potassium excretory defect; history of recurrent hyperkalemia due to diabetic renal disease; ACE/ARB contraindicated; hyperkalemia 06/2011 secondary to TMP-SMZ   Hypothyroidism    IDDM (insulin dependent diabetes mellitus)    38 years   Low HDL (under 40)    Proteinuria    Retinopathy    x2   SIRS (systemic inflammatory response syndrome) ([place])     Tobacco History: Social History   Tobacco Use  Smoking Status Never  Smokeless Tobacco  Never   Counseling given: Not Answered   Outpatient Medications Prior to Visit  Medication Sig Dispense Refill   acetaminophen (TYLENOL) 500 MG tablet Take 1,000 mg by mouth 2 (two) times daily.      acyclovir (ZOVIRAX) 400 MG tablet Takes mondays, wednesdays and fridays     AgaMatrix Ultra-Thin Lancets MISC TEST BS 4 TIMES A  DAY AND AS NEEDED DX E10.65 400 each 3   albuterol (PROVENTIL) (2.5 MG/3ML) 0.083% nebulizer solution Take 3 mLs (2.5 mg total) by nebulization every 6 (six) hours as needed for wheezing or shortness of breath. 75 mL 12   albuterol (VENTOLIN HFA) 108 (90 Base) MCG/ACT inhaler Inhale 2 puffs into the lungs every 6 (six) hours as needed for wheezing or shortness of breath. 8 g 5   amLODipine (NORVASC) 10 MG tablet Take 5 mg by mouth at bedtime as needed.     aspirin 81 MG chewable tablet Chew 81 mg by mouth every morning.     budesonide-formoterol (SYMBICORT) 160-4.5 MCG/ACT inhaler Inhale 2 puffs into the lungs in the morning and at bedtime. 1 each 6   cyclobenzaprine (FLEXERIL) 10 MG tablet TAKE 1 TABLET BY MOUTH THREE TIMES A DAY AS NEEDED FOR MUSCLE SPASMS 30 tablet 3   diclofenac sodium (VOLTAREN) 1 % GEL Apply 2 g topically 4 (four) times daily. 350 g 3   diphenoxylate-atropine (LOMOTIL) 2.5-0.025 MG tablet Take 1 tablet by mouth 4 (four) times daily as needed for diarrhea or loose stools. 60 tablet 2   DOVATO 50-300 MG tablet TAKE 1 TABLET BY MOUTH DAILY 30 tablet 3   doxycycline (VIBRA-TABS) 100 MG tablet Take 1 tablet (100 mg total) by mouth 2 (two) times daily. 1 po bid 20 tablet 0   DULoxetine (CYMBALTA) 30 MG capsule Take 1 capsule (30 mg total) by mouth daily. TAKE WITH THE 60MG  FOR TOTAL OF 90MG  90 capsule 3   DULoxetine (CYMBALTA) 60 MG capsule Take 1 capsule (60 mg total) by mouth daily. Take with 30 mg fot a total of 90mg  90 capsule 3   esomeprazole (NEXIUM) 40 MG capsule Take 1 capsule (40 mg total) by mouth daily. 90 capsule 3   febuxostat (ULORIC) 40 MG tablet Take 1 tablet (40 mg total) by mouth daily. 90 tablet 3   ferrous sulfate 325 (65 FE) MG tablet Take 650 mg by mouth daily with breakfast.      fluticasone (FLONASE) 50 MCG/ACT nasal spray SPRAY 2 SPRAYS INTO EACH NOSTRIL EVERY DAY 48 mL 1   furosemide (LASIX) 40 MG tablet Take 40 mg by mouth daily. 40 mg nightly  4    Glucosamine-Chondroit-Vit C-Mn (GLUCOSAMINE 1500 COMPLEX PO) Take 1 tablet by mouth 2 (two) times daily.      glucosamine-chondroitin 500-400 MG tablet Take by mouth.      glucose blood (ONETOUCH VERIO) test strip TEST BLOOD SUGAR 4 TIMES DAILY AND AS NEEDED 300 each 4   HYDROcodone bit-homatropine (HYCODAN) 5-1.5 MG/5ML syrup Take 5 mLs by mouth every 6 (six) hours as needed for cough. 240 mL 0   hydrOXYzine (ATARAX/VISTARIL) 25 MG tablet Take 1 tablet (25 mg total) by mouth every 8 (eight) hours as needed. 270 tablet 3   hyoscyamine (LEVSIN) 0.125 MG tablet TAKE 1 TABLET (0.125 MG TOTAL) BY MOUTH EVERY 4 (FOUR) HOURS AS NEEDED. 60 tablet 2   icosapent Ethyl (VASCEPA) 1 g capsule Take 2 capsules (2 g total) by mouth 2 (two) times daily. 120 capsule 3  insulin glargine (LANTUS) 100 UNIT/ML injection In the event of insulin pump failure, inject 8 units twice daily     insulin lispro (HUMALOG) 100 UNIT/ML injection USE 42 UNITS TO 120 UNITS PER PUMP DAILY AS DIRECTED 90 mL 0   ipratropium (ATROVENT) 0.03 % nasal spray USE 2 SPRAYS IN EACH NOSTRIL 2-3 TIMES DAILY 30 mL 1   levocetirizine (XYZAL) 5 MG tablet Take 1 tablet (5 mg total) by mouth every evening. 90 tablet 3   levothyroxine (SYNTHROID) 200 MCG tablet Take 1 tablet (200 mcg total) by mouth daily. 90 tablet 1   lipase/protease/amylase (CREON) 12000-38000 units CPEP capsule Take 2 capsules prior to meals and 1 capsule prior to snacks     LORazepam (ATIVAN) 0.5 MG tablet Take by mouth.     losartan (COZAAR) 100 MG tablet Take 100 mg by mouth daily.     meclizine (ANTIVERT) 12.5 MG tablet Take 1 tablet (12.5 mg total) by mouth 3 (three) times daily as needed for dizziness. 30 tablet 3   metoCLOPramide (REGLAN) 5 MG tablet Take 1 tablet (5 mg total) by mouth 3 (three) times daily before meals. 270 tablet 3   montelukast (SINGULAIR) 10 MG tablet Take 1 tablet (10 mg total) by mouth at bedtime. 30 tablet 6   niacin (NIASPAN) 1000 MG CR tablet TAKE  1 TABLET (1,000 MG TOTAL) BY MOUTH AT BEDTIME. 30 tablet 5   ondansetron (ZOFRAN) 4 MG tablet TAKE 1 TABLET BY MOUTH EVERY 8 HOURS AS NEEDED FOR NAUSEA 45 tablet 6   predniSONE (DELTASONE) 20 MG tablet 2 po at same time daily for 5 days 10 tablet 0   Probiotic Product (MISC INTESTINAL FLORA REGULAT) CAPS Take 1 capsule by mouth every morning.     promethazine (PHENERGAN) 25 MG suppository PLACE 1 SUPPOSITORY (25 MG TOTAL) RECTALLY EVERY 6 (SIX) HOURS AS NEEDED FOR NAUSEA OR VOMITING. 30 suppository 1   rosuvastatin (CRESTOR) 20 MG tablet Take 20 mg by mouth at bedtime.     sevelamer carbonate (RENVELA) 800 MG tablet Take 800 mg by mouth 3 (three) times daily. 2 tabs before each meal     tamsulosin (FLOMAX) 0.4 MG CAPS capsule Take 0.4 mg by mouth daily.     Testosterone 20.25 MG/ACT (1.62%) GEL APPLY 3 PUMPS DAILY AS DIRECTED 75 g 0   traMADol (ULTRAM) 50 MG tablet Take 1 tablet (50 mg total) by mouth daily as needed. 30 tablet 0   traZODone (DESYREL) 50 MG tablet Take by mouth.     No facility-administered medications prior to visit.     Review of Systems:   Constitutional:   No  weight loss, night sweats,  Fevers, chills,  +fatigue, or  lassitude.  HEENT:   No headaches,  Difficulty swallowing,  Tooth/dental problems, or  Sore throat,                No sneezing, itching, ear ache,  +nasal congestion, post nasal drip,   CV:  No chest pain,  Orthopnea, PND, swelling in lower extremities, anasarca, dizziness, palpitations, syncope.   GI  No heartburn, indigestion, abdominal pain, nausea, vomiting, diarrhea, change in bowel habits, loss of appetite, bloody stools.   Resp: .  No chest wall deformity  Skin: no rash or lesions.  GU: no dysuria, change in color of urine, no urgency or frequency.  No flank pain, no hematuria   MS:  No joint pain or swelling.  No decreased range of motion.  No back  pain.    Physical Exam  BP (!) 160/80 (BP Location: Right Arm, Patient Position:  Sitting, Cuff Size: Large)   Pulse 85   Temp 98 F (36.7 C) (Oral)   Ht 5\' 10"  (1.778 m)   Wt 221 lb 12.8 oz (100.6 kg)   SpO2 95%   BMI 31.82 kg/m   GEN: A/Ox3; pleasant , NAD, well nourished    HEENT:  [place]/AT,   NOSE-clear, THROAT-clear, no lesions, no postnasal drip or exudate noted.   NECK:  Supple w/ fair ROM; no JVD; normal carotid impulses w/o bruits; no thyromegaly or nodules palpated; no lymphadenopathy.    RESP  Clear  P & A; w/o, wheezes/ rales/ or rhonchi. no accessory muscle use, no dullness to percussion  CARD:  RRR, no m/r/g, no peripheral edema, pulses intact, no cyanosis or clubbing.  GI:   Soft & nt; nml bowel sounds; no organomegaly or masses detected.   Musco: Warm bil, no deformities or joint swelling noted.   Neuro: alert, no focal deficits noted.    Skin: Warm, no lesions or rashes    Lab Results:    BMET   BNP No results found for: BNP    Imaging: DG Chest 2 View  Result Date: 01/01/2021 CLINICAL DATA:  Cough for 2 months. EXAM: CHEST - 2 VIEW COMPARISON:  CT chest 06/23/2020; X-ray chest 06/14/2020. FINDINGS: The cardiomediastinal silhouette is unchanged with normal heart size. The lungs are well inflated. The right lung apex was incompletely imaged. No airspace consolidation, edema, pleural effusion, or pneumothorax is identified. No acute osseous abnormality is seen. IMPRESSION: No active cardiopulmonary disease. Electronically Signed   By: Logan Bores M.D.   On: 01/01/2021 10:07    cefTRIAXone (ROCEPHIN) injection 1 g     Date Action Dose Route User   01/11/2021 1435 Given 1 g Intramuscular (Right Upper Outer Quadrant) Bullins, Jamie H, LPN      methylPREDNISolone acetate (DEPO-MEDROL) injection 80 mg     Date Action Dose Route User   01/11/2021 1438 Given 80 mg Intramuscular (Left Upper Outer Quadrant) Bullins, Jamie H, LPN       No flowsheet data found.  No results found for: NITRICOXIDE      Assessment & Plan:    Acute bronchitis Recurrent flare -improving on course of antibitoics and steroids  CXR without acute process   Plan  Patient Instructions  Finish Doxycycline and Prednisone as directed.  Delsym 2 tsp Twice daily  for cough As needed   Tessalon 200mg  Three times a day  For cough as needed.  Sips of water , honey to soothe throat and avoid throat clearing /coughing  Albuterol inhaler As needed   Continue on Symbicort 2 puffs Twice daily  rinse after use  Nexium daily  Xyzal daily  Follow up with Dr. Halford Chessman or Shenea Giacobbe NP in 4 weeks and As needed   Please contact office for sooner follow up if symptoms do not improve or worsen or seek emergency care         OSA (obstructive sleep apnea) Cont on CPAP   ESRD (end stage renal disease) on dialysis ([place]) Cont w/ HD and follow up with Nephrology   HIV disease ([place]) Cont on current regimen and follow up with ID clinic     Rexene Edison, NP 01/15/2021

## 2021-01-16 DIAGNOSIS — N186 End stage renal disease: Secondary | ICD-10-CM | POA: Diagnosis not present

## 2021-01-16 DIAGNOSIS — E8779 Other fluid overload: Secondary | ICD-10-CM | POA: Diagnosis not present

## 2021-01-16 DIAGNOSIS — N2581 Secondary hyperparathyroidism of renal origin: Secondary | ICD-10-CM | POA: Diagnosis not present

## 2021-01-16 DIAGNOSIS — D631 Anemia in chronic kidney disease: Secondary | ICD-10-CM | POA: Diagnosis not present

## 2021-01-16 DIAGNOSIS — D509 Iron deficiency anemia, unspecified: Secondary | ICD-10-CM | POA: Diagnosis not present

## 2021-01-16 NOTE — Progress Notes (Signed)
Reviewed and agree with assessment/plan.   Chesley Mires, MD [facility] Pulmonary/Critical Care 01/16/2021, 7:09 AM Pager:  [phone]

## 2021-01-18 DIAGNOSIS — D509 Iron deficiency anemia, unspecified: Secondary | ICD-10-CM | POA: Diagnosis not present

## 2021-01-18 DIAGNOSIS — N186 End stage renal disease: Secondary | ICD-10-CM | POA: Diagnosis not present

## 2021-01-18 DIAGNOSIS — D631 Anemia in chronic kidney disease: Secondary | ICD-10-CM | POA: Diagnosis not present

## 2021-01-18 DIAGNOSIS — E8779 Other fluid overload: Secondary | ICD-10-CM | POA: Diagnosis not present

## 2021-01-18 DIAGNOSIS — N2581 Secondary hyperparathyroidism of renal origin: Secondary | ICD-10-CM | POA: Diagnosis not present

## 2021-01-20 DIAGNOSIS — E8779 Other fluid overload: Secondary | ICD-10-CM | POA: Diagnosis not present

## 2021-01-20 DIAGNOSIS — D631 Anemia in chronic kidney disease: Secondary | ICD-10-CM | POA: Diagnosis not present

## 2021-01-20 DIAGNOSIS — N186 End stage renal disease: Secondary | ICD-10-CM | POA: Diagnosis not present

## 2021-01-20 DIAGNOSIS — D509 Iron deficiency anemia, unspecified: Secondary | ICD-10-CM | POA: Diagnosis not present

## 2021-01-20 DIAGNOSIS — N2581 Secondary hyperparathyroidism of renal origin: Secondary | ICD-10-CM | POA: Diagnosis not present

## 2021-01-23 DIAGNOSIS — N2581 Secondary hyperparathyroidism of renal origin: Secondary | ICD-10-CM | POA: Diagnosis not present

## 2021-01-23 DIAGNOSIS — D631 Anemia in chronic kidney disease: Secondary | ICD-10-CM | POA: Diagnosis not present

## 2021-01-23 DIAGNOSIS — N186 End stage renal disease: Secondary | ICD-10-CM | POA: Diagnosis not present

## 2021-01-23 DIAGNOSIS — E8779 Other fluid overload: Secondary | ICD-10-CM | POA: Diagnosis not present

## 2021-01-23 DIAGNOSIS — D509 Iron deficiency anemia, unspecified: Secondary | ICD-10-CM | POA: Diagnosis not present

## 2021-01-25 DIAGNOSIS — N2581 Secondary hyperparathyroidism of renal origin: Secondary | ICD-10-CM | POA: Diagnosis not present

## 2021-01-25 DIAGNOSIS — N186 End stage renal disease: Secondary | ICD-10-CM | POA: Diagnosis not present

## 2021-01-25 DIAGNOSIS — D631 Anemia in chronic kidney disease: Secondary | ICD-10-CM | POA: Diagnosis not present

## 2021-01-25 DIAGNOSIS — D509 Iron deficiency anemia, unspecified: Secondary | ICD-10-CM | POA: Diagnosis not present

## 2021-01-25 DIAGNOSIS — E8779 Other fluid overload: Secondary | ICD-10-CM | POA: Diagnosis not present

## 2021-01-26 ENCOUNTER — Ambulatory Visit: Payer: Medicare Other | Admitting: Family Medicine

## 2021-01-27 DIAGNOSIS — D631 Anemia in chronic kidney disease: Secondary | ICD-10-CM | POA: Diagnosis not present

## 2021-01-27 DIAGNOSIS — N186 End stage renal disease: Secondary | ICD-10-CM | POA: Diagnosis not present

## 2021-01-27 DIAGNOSIS — N2581 Secondary hyperparathyroidism of renal origin: Secondary | ICD-10-CM | POA: Diagnosis not present

## 2021-01-27 DIAGNOSIS — E8779 Other fluid overload: Secondary | ICD-10-CM | POA: Diagnosis not present

## 2021-01-27 DIAGNOSIS — D509 Iron deficiency anemia, unspecified: Secondary | ICD-10-CM | POA: Diagnosis not present

## 2021-01-29 DIAGNOSIS — G4733 Obstructive sleep apnea (adult) (pediatric): Secondary | ICD-10-CM | POA: Diagnosis not present

## 2021-01-30 DIAGNOSIS — E8779 Other fluid overload: Secondary | ICD-10-CM | POA: Diagnosis not present

## 2021-01-30 DIAGNOSIS — N2581 Secondary hyperparathyroidism of renal origin: Secondary | ICD-10-CM | POA: Diagnosis not present

## 2021-01-30 DIAGNOSIS — D509 Iron deficiency anemia, unspecified: Secondary | ICD-10-CM | POA: Diagnosis not present

## 2021-01-30 DIAGNOSIS — D631 Anemia in chronic kidney disease: Secondary | ICD-10-CM | POA: Diagnosis not present

## 2021-01-30 DIAGNOSIS — N186 End stage renal disease: Secondary | ICD-10-CM | POA: Diagnosis not present

## 2021-01-31 DIAGNOSIS — D510 Vitamin B12 deficiency anemia due to intrinsic factor deficiency: Secondary | ICD-10-CM | POA: Diagnosis not present

## 2021-02-01 ENCOUNTER — Other Ambulatory Visit: Payer: Self-pay | Admitting: Family Medicine

## 2021-02-01 DIAGNOSIS — D509 Iron deficiency anemia, unspecified: Secondary | ICD-10-CM | POA: Diagnosis not present

## 2021-02-01 DIAGNOSIS — D631 Anemia in chronic kidney disease: Secondary | ICD-10-CM | POA: Diagnosis not present

## 2021-02-01 DIAGNOSIS — E8779 Other fluid overload: Secondary | ICD-10-CM | POA: Diagnosis not present

## 2021-02-01 DIAGNOSIS — N2581 Secondary hyperparathyroidism of renal origin: Secondary | ICD-10-CM | POA: Diagnosis not present

## 2021-02-01 DIAGNOSIS — N186 End stage renal disease: Secondary | ICD-10-CM | POA: Diagnosis not present

## 2021-02-03 DIAGNOSIS — N2581 Secondary hyperparathyroidism of renal origin: Secondary | ICD-10-CM | POA: Diagnosis not present

## 2021-02-03 DIAGNOSIS — N186 End stage renal disease: Secondary | ICD-10-CM | POA: Diagnosis not present

## 2021-02-03 DIAGNOSIS — D631 Anemia in chronic kidney disease: Secondary | ICD-10-CM | POA: Diagnosis not present

## 2021-02-03 DIAGNOSIS — Z992 Dependence on renal dialysis: Secondary | ICD-10-CM | POA: Diagnosis not present

## 2021-02-03 DIAGNOSIS — D509 Iron deficiency anemia, unspecified: Secondary | ICD-10-CM | POA: Diagnosis not present

## 2021-02-03 DIAGNOSIS — E8779 Other fluid overload: Secondary | ICD-10-CM | POA: Diagnosis not present

## 2021-02-05 ENCOUNTER — Other Ambulatory Visit: Payer: Self-pay | Admitting: Adult Health

## 2021-02-06 DIAGNOSIS — D631 Anemia in chronic kidney disease: Secondary | ICD-10-CM | POA: Diagnosis not present

## 2021-02-06 DIAGNOSIS — N2581 Secondary hyperparathyroidism of renal origin: Secondary | ICD-10-CM | POA: Diagnosis not present

## 2021-02-06 DIAGNOSIS — N186 End stage renal disease: Secondary | ICD-10-CM | POA: Diagnosis not present

## 2021-02-06 DIAGNOSIS — D509 Iron deficiency anemia, unspecified: Secondary | ICD-10-CM | POA: Diagnosis not present

## 2021-02-06 DIAGNOSIS — E8779 Other fluid overload: Secondary | ICD-10-CM | POA: Diagnosis not present

## 2021-02-06 NOTE — Telephone Encounter (Signed)
Please advise on pt's email regarding Tessalon 200mg  refill.  Last refilled on 06/09/20 for #30 with 1 refill by TP.

## 2021-02-07 NOTE — Telephone Encounter (Signed)
Patient should contact office for appt

## 2021-02-08 DIAGNOSIS — N186 End stage renal disease: Secondary | ICD-10-CM | POA: Diagnosis not present

## 2021-02-08 DIAGNOSIS — E8779 Other fluid overload: Secondary | ICD-10-CM | POA: Diagnosis not present

## 2021-02-08 DIAGNOSIS — D509 Iron deficiency anemia, unspecified: Secondary | ICD-10-CM | POA: Diagnosis not present

## 2021-02-08 DIAGNOSIS — D631 Anemia in chronic kidney disease: Secondary | ICD-10-CM | POA: Diagnosis not present

## 2021-02-08 DIAGNOSIS — N2581 Secondary hyperparathyroidism of renal origin: Secondary | ICD-10-CM | POA: Diagnosis not present

## 2021-02-08 DIAGNOSIS — E119 Type 2 diabetes mellitus without complications: Secondary | ICD-10-CM | POA: Diagnosis not present

## 2021-02-08 MED ORDER — BENZONATATE 200 MG PO CAPS: 200.0000 mg | ORAL_CAPSULE | Freq: Three times a day (TID) | ORAL | 2 refills | Status: DC | PRN

## 2021-02-08 NOTE — Telephone Encounter (Signed)
YEs that is fine .

## 2021-02-10 DIAGNOSIS — D509 Iron deficiency anemia, unspecified: Secondary | ICD-10-CM | POA: Diagnosis not present

## 2021-02-10 DIAGNOSIS — D631 Anemia in chronic kidney disease: Secondary | ICD-10-CM | POA: Diagnosis not present

## 2021-02-10 DIAGNOSIS — E8779 Other fluid overload: Secondary | ICD-10-CM | POA: Diagnosis not present

## 2021-02-10 DIAGNOSIS — N186 End stage renal disease: Secondary | ICD-10-CM | POA: Diagnosis not present

## 2021-02-10 DIAGNOSIS — N2581 Secondary hyperparathyroidism of renal origin: Secondary | ICD-10-CM | POA: Diagnosis not present

## 2021-02-13 DIAGNOSIS — N186 End stage renal disease: Secondary | ICD-10-CM | POA: Diagnosis not present

## 2021-02-13 DIAGNOSIS — E8779 Other fluid overload: Secondary | ICD-10-CM | POA: Diagnosis not present

## 2021-02-13 DIAGNOSIS — N2581 Secondary hyperparathyroidism of renal origin: Secondary | ICD-10-CM | POA: Diagnosis not present

## 2021-02-13 DIAGNOSIS — D631 Anemia in chronic kidney disease: Secondary | ICD-10-CM | POA: Diagnosis not present

## 2021-02-13 DIAGNOSIS — D509 Iron deficiency anemia, unspecified: Secondary | ICD-10-CM | POA: Diagnosis not present

## 2021-02-14 ENCOUNTER — Other Ambulatory Visit: Payer: Self-pay | Admitting: Family Medicine

## 2021-02-14 ENCOUNTER — Ambulatory Visit: Payer: Medicare Other | Admitting: Adult Health

## 2021-02-15 DIAGNOSIS — E109 Type 1 diabetes mellitus without complications: Secondary | ICD-10-CM | POA: Diagnosis not present

## 2021-02-15 DIAGNOSIS — D509 Iron deficiency anemia, unspecified: Secondary | ICD-10-CM | POA: Diagnosis not present

## 2021-02-15 DIAGNOSIS — N2581 Secondary hyperparathyroidism of renal origin: Secondary | ICD-10-CM | POA: Diagnosis not present

## 2021-02-15 DIAGNOSIS — E8779 Other fluid overload: Secondary | ICD-10-CM | POA: Diagnosis not present

## 2021-02-15 DIAGNOSIS — N186 End stage renal disease: Secondary | ICD-10-CM | POA: Diagnosis not present

## 2021-02-15 DIAGNOSIS — E1022 Type 1 diabetes mellitus with diabetic chronic kidney disease: Secondary | ICD-10-CM | POA: Diagnosis not present

## 2021-02-15 DIAGNOSIS — D631 Anemia in chronic kidney disease: Secondary | ICD-10-CM | POA: Diagnosis not present

## 2021-02-16 DIAGNOSIS — N186 End stage renal disease: Secondary | ICD-10-CM | POA: Diagnosis not present

## 2021-02-16 DIAGNOSIS — D539 Nutritional anemia, unspecified: Secondary | ICD-10-CM | POA: Diagnosis not present

## 2021-02-16 DIAGNOSIS — D510 Vitamin B12 deficiency anemia due to intrinsic factor deficiency: Secondary | ICD-10-CM | POA: Diagnosis not present

## 2021-02-16 DIAGNOSIS — N189 Chronic kidney disease, unspecified: Secondary | ICD-10-CM | POA: Diagnosis not present

## 2021-02-16 DIAGNOSIS — Z992 Dependence on renal dialysis: Secondary | ICD-10-CM | POA: Diagnosis not present

## 2021-02-16 DIAGNOSIS — D638 Anemia in other chronic diseases classified elsewhere: Secondary | ICD-10-CM | POA: Diagnosis not present

## 2021-02-16 DIAGNOSIS — N170 Acute kidney failure with tubular necrosis: Secondary | ICD-10-CM | POA: Diagnosis not present

## 2021-02-17 DIAGNOSIS — N2581 Secondary hyperparathyroidism of renal origin: Secondary | ICD-10-CM | POA: Diagnosis not present

## 2021-02-17 DIAGNOSIS — D509 Iron deficiency anemia, unspecified: Secondary | ICD-10-CM | POA: Diagnosis not present

## 2021-02-17 DIAGNOSIS — E8779 Other fluid overload: Secondary | ICD-10-CM | POA: Diagnosis not present

## 2021-02-17 DIAGNOSIS — N186 End stage renal disease: Secondary | ICD-10-CM | POA: Diagnosis not present

## 2021-02-17 DIAGNOSIS — D631 Anemia in chronic kidney disease: Secondary | ICD-10-CM | POA: Diagnosis not present

## 2021-02-20 DIAGNOSIS — D509 Iron deficiency anemia, unspecified: Secondary | ICD-10-CM | POA: Diagnosis not present

## 2021-02-20 DIAGNOSIS — E8779 Other fluid overload: Secondary | ICD-10-CM | POA: Diagnosis not present

## 2021-02-20 DIAGNOSIS — N186 End stage renal disease: Secondary | ICD-10-CM | POA: Diagnosis not present

## 2021-02-20 DIAGNOSIS — N2581 Secondary hyperparathyroidism of renal origin: Secondary | ICD-10-CM | POA: Diagnosis not present

## 2021-02-20 DIAGNOSIS — D631 Anemia in chronic kidney disease: Secondary | ICD-10-CM | POA: Diagnosis not present

## 2021-02-21 ENCOUNTER — Ambulatory Visit (INDEPENDENT_AMBULATORY_CARE_PROVIDER_SITE_OTHER): Payer: Medicare Other | Admitting: Family Medicine

## 2021-02-21 ENCOUNTER — Ambulatory Visit: Payer: Medicare Other | Admitting: Family Medicine

## 2021-02-21 ENCOUNTER — Encounter: Payer: Self-pay | Admitting: Family Medicine

## 2021-02-21 VITALS — BP 151/71 | HR 83 | Temp 97.8°F | Ht 70.0 in | Wt 216.0 lb

## 2021-02-21 DIAGNOSIS — E103552 Type 1 diabetes mellitus with stable proliferative diabetic retinopathy, left eye: Secondary | ICD-10-CM

## 2021-02-21 DIAGNOSIS — E1022 Type 1 diabetes mellitus with diabetic chronic kidney disease: Secondary | ICD-10-CM | POA: Diagnosis not present

## 2021-02-21 DIAGNOSIS — E103551 Type 1 diabetes mellitus with stable proliferative diabetic retinopathy, right eye: Secondary | ICD-10-CM | POA: Diagnosis not present

## 2021-02-21 DIAGNOSIS — N186 End stage renal disease: Secondary | ICD-10-CM

## 2021-02-21 DIAGNOSIS — E039 Hypothyroidism, unspecified: Secondary | ICD-10-CM | POA: Diagnosis not present

## 2021-02-21 DIAGNOSIS — Z992 Dependence on renal dialysis: Secondary | ICD-10-CM | POA: Diagnosis not present

## 2021-02-21 DIAGNOSIS — N1832 Chronic kidney disease, stage 3b: Secondary | ICD-10-CM

## 2021-02-21 MED ORDER — INSULIN LISPRO 100 UNIT/ML IJ SOLN: INTRAMUSCULAR | 3 refills | Status: AC

## 2021-02-21 MED ORDER — LEVOTHYROXINE SODIUM 200 MCG PO TABS: 200.0000 ug | ORAL_TABLET | Freq: Every day | ORAL | 3 refills | Status: DC

## 2021-02-21 NOTE — Progress Notes (Signed)
BP (!) 151/71    Pulse 83    Temp 97.8 F (36.6 C) (Temporal)    Ht 5\' 10"  (1.778 m)    Wt 216 lb (98 kg)    SpO2 96%    BMI 30.99 kg/m    Subjective:   Patient ID: Patrick Brown, male    DOB: 04/18/1970, 51 y.o.   MRN: [id number]  HPI: Patrick Brown is a 51 y.o. male presenting on 02/21/2021 for No chief complaint on file.   HPI Patient has chronic anemia due to CKD and end-stage renal disease on dialysis and type 1 diabetes.  He sees specialist for all of these and they manage most of this.  Hypothyroidism recheck Patient is coming in for thyroid recheck today as well. They deny any issues with hair changes or heat or cold problems or diarrhea or constipation. They deny any chest pain or palpitations. They are currently on levothyroxine 200 micrograms    Relevant past medical, surgical, family and social history reviewed and updated as indicated. Interim medical history since our last visit reviewed. Allergies and medications reviewed and updated.  Review of Systems  Constitutional:  Negative for chills and fever.  Eyes:  Negative for visual disturbance.  Respiratory:  Negative for shortness of breath and wheezing.   Cardiovascular:  Negative for chest pain and leg swelling.  Musculoskeletal:  Negative for back pain and gait problem.  Skin:  Negative for rash.  Neurological:  Negative for dizziness and light-headedness.  All other systems reviewed and are negative.  Per HPI unless specifically indicated above   Allergies as of 02/21/2021       Reactions   Sulfa Antibiotics Other (See Comments)   High potassium   Ramipril Cough   Versed [midazolam] Other (See Comments)   "I don't wake up very good or clear it out of my system"        Medication List        Accurate as of February 21, 2021  9:54 AM. If you have any questions, ask your nurse or doctor.          STOP taking these medications    doxycycline 100 MG tablet Commonly known as: VIBRA-TABS Stopped  by: Worthy Rancher, MD   hydrOXYzine 25 MG tablet Commonly known as: ATARAX Stopped by: Worthy Rancher, MD   predniSONE 20 MG tablet Commonly known as: DELTASONE Stopped by: Worthy Rancher, MD   traMADol 50 MG tablet Commonly known as: ULTRAM Stopped by: Fransisca Kaufmann Elsia Lasota, MD       TAKE these medications    acetaminophen 500 MG tablet Commonly known as: TYLENOL Take 1,000 mg by mouth 2 (two) times daily.   acyclovir 400 MG tablet Commonly known as: ZOVIRAX Takes mondays, wednesdays and fridays   AgaMatrix Ultra-Thin Lancets Misc TEST BS 4 TIMES A DAY AND AS NEEDED DX E10.65   albuterol 108 (90 Base) MCG/ACT inhaler Commonly known as: VENTOLIN HFA Inhale 2 puffs into the lungs every 6 (six) hours as needed for wheezing or shortness of breath.   albuterol (2.5 MG/3ML) 0.083% nebulizer solution Commonly known as: PROVENTIL Take 3 mLs (2.5 mg total) by nebulization every 6 (six) hours as needed for wheezing or shortness of breath.   amLODipine 10 MG tablet Commonly known as: NORVASC Take 5 mg by mouth at bedtime as needed.   aspirin 81 MG chewable tablet Chew 81 mg by mouth every morning.   benzonatate 200 MG capsule Commonly known  as: TESSALON Take 1 capsule (200 mg total) by mouth 3 (three) times daily as needed for cough.   budesonide-formoterol 160-4.5 MCG/ACT inhaler Commonly known as: Symbicort Inhale 2 puffs into the lungs in the morning and at bedtime.   cyclobenzaprine 10 MG tablet Commonly known as: FLEXERIL TAKE 1 TABLET BY MOUTH THREE TIMES A DAY AS NEEDED FOR MUSCLE SPASMS   diclofenac sodium 1 % Gel Commonly known as: VOLTAREN Apply 2 g topically 4 (four) times daily.   diphenoxylate-atropine 2.5-0.025 MG tablet Commonly known as: Lomotil Take 1 tablet by mouth 4 (four) times daily as needed for diarrhea or loose stools.   Dovato 50-300 MG tablet Generic drug: dolutegravir-lamiVUDine TAKE 1 TABLET BY MOUTH DAILY   DULoxetine  60 MG capsule Commonly known as: CYMBALTA Take 1 capsule (60 mg total) by mouth daily. Take with 30 mg fot a total of 90mg    DULoxetine 30 MG capsule Commonly known as: CYMBALTA Take 1 capsule (30 mg total) by mouth daily. TAKE WITH THE 60MG  FOR TOTAL OF 90MG    esomeprazole 40 MG capsule Commonly known as: NEXIUM Take 1 capsule (40 mg total) by mouth daily.   febuxostat 40 MG tablet Commonly known as: ULORIC Take 1 tablet (40 mg total) by mouth daily.   ferrous sulfate 325 (65 FE) MG tablet Take 650 mg by mouth daily with breakfast.   fluticasone 50 MCG/ACT nasal spray Commonly known as: FLONASE SPRAY 2 SPRAYS INTO EACH NOSTRIL EVERY DAY   furosemide 40 MG tablet Commonly known as: LASIX Take 40 mg by mouth daily. 40 mg nightly   GLUCOSAMINE 1500 COMPLEX PO Take 1 tablet by mouth 2 (two) times daily.   glucosamine-chondroitin 500-400 MG tablet Take by mouth.   glucose blood test strip Commonly known as: OneTouch Verio TEST BLOOD SUGAR 4 TIMES DAILY AND AS NEEDED   hydrALAZINE 100 MG tablet Commonly known as: APRESOLINE Take by mouth.   HYDROcodone bit-homatropine 5-1.5 MG/5ML syrup Commonly known as: HYCODAN Take 5 mLs by mouth every 6 (six) hours as needed for cough.   hyoscyamine 0.125 MG tablet Commonly known as: LEVSIN TAKE 1 TABLET (0.125 MG TOTAL) BY MOUTH EVERY 4 (FOUR) HOURS AS NEEDED.   icosapent Ethyl 1 g capsule Commonly known as: VASCEPA Take 2 capsules (2 g total) by mouth 2 (two) times daily.   insulin glargine 100 UNIT/ML injection Commonly known as: LANTUS In the event of insulin pump failure, inject 8 units twice daily   insulin lispro 100 UNIT/ML injection Commonly known as: HumaLOG USE 42 UNITS TO 120 UNITS PER PUMP DAILY AS DIRECTED   ipratropium 0.03 % nasal spray Commonly known as: ATROVENT USE 2 SPRAYS IN EACH NOSTRIL 2-3 TIMES DAILY   levocetirizine 5 MG tablet Commonly known as: XYZAL Take 1 tablet (5 mg total) by mouth every  evening.   levothyroxine 200 MCG tablet Commonly known as: SYNTHROID Take 1 tablet (200 mcg total) by mouth daily.   lipase/protease/amylase 12000-38000 units Cpep capsule Commonly known as: CREON Take 2 capsules prior to meals and 1 capsule prior to snacks   LORazepam 0.5 MG tablet Commonly known as: ATIVAN Take by mouth.   losartan 100 MG tablet Commonly known as: COZAAR Take 100 mg by mouth daily.   meclizine 12.5 MG tablet Commonly known as: ANTIVERT Take 1 tablet (12.5 mg total) by mouth 3 (three) times daily as needed for dizziness.   metoCLOPramide 5 MG tablet Commonly known as: REGLAN Take 1 tablet (5 mg total)  by mouth 3 (three) times daily before meals.   Misc Intestinal Flora Regulat Caps Take 1 capsule by mouth every morning.   montelukast 10 MG tablet Commonly known as: SINGULAIR Take 1 tablet (10 mg total) by mouth at bedtime.   niacin 1000 MG CR tablet Commonly known as: NIASPAN TAKE 1 TABLET (1,000 MG TOTAL) BY MOUTH AT BEDTIME.   ondansetron 4 MG tablet Commonly known as: ZOFRAN TAKE 1 TABLET BY MOUTH EVERY 8 HOURS AS NEEDED FOR NAUSEA   promethazine 25 MG suppository Commonly known as: PHENERGAN PLACE 1 SUPPOSITORY (25 MG TOTAL) RECTALLY EVERY 6 (SIX) HOURS AS NEEDED FOR NAUSEA OR VOMITING.   rosuvastatin 20 MG tablet Commonly known as: CRESTOR Take 20 mg by mouth at bedtime.   sevelamer carbonate 800 MG tablet Commonly known as: RENVELA Take 800 mg by mouth 3 (three) times daily. 2 tabs before each meal   tamsulosin 0.4 MG Caps capsule Commonly known as: FLOMAX Take 0.4 mg by mouth daily.   Testosterone 20.25 MG/ACT (1.62%) Gel APPLY 3 PUMPS DAILY AS DIRECTED   traZODone 50 MG tablet Commonly known as: DESYREL Take by mouth.         Objective:   BP (!) 151/71    Pulse 83    Temp 97.8 F (36.6 C) (Temporal)    Ht 5\' 10"  (1.778 m)    Wt 216 lb (98 kg)    SpO2 96%    BMI 30.99 kg/m   Wt Readings from Last 3 Encounters:   02/21/21 216 lb (98 kg)  01/15/21 221 lb 12.8 oz (100.6 kg)  01/11/21 214 lb (97.1 kg)    Physical Exam Vitals and nursing note reviewed.  Constitutional:      General: He is not in acute distress.    Appearance: He is well-developed. He is not diaphoretic.  Eyes:     General: No scleral icterus.    Conjunctiva/sclera: Conjunctivae normal.  Neck:     Thyroid: No thyromegaly.  Cardiovascular:     Rate and Rhythm: Normal rate and regular rhythm.     Heart sounds: Normal heart sounds. No murmur heard. Pulmonary:     Effort: Pulmonary effort is normal. No respiratory distress.     Breath sounds: Normal breath sounds. No wheezing.  Musculoskeletal:        General: Normal range of motion.     Cervical back: Neck supple.  Lymphadenopathy:     Cervical: No cervical adenopathy.  Skin:    General: Skin is warm and dry.     Findings: No rash.  Neurological:     Mental Status: He is alert and oriented to person, place, and time.     Coordination: Coordination normal.  Psychiatric:        Behavior: Behavior normal.      Assessment & Plan:   Problem List Items Addressed This Visit       Endocrine   Type 1 diabetes mellitus ([place]) - Primary   Relevant Medications   insulin lispro (HUMALOG) 100 UNIT/ML injection   Other Relevant Orders   TSH   Acquired hypothyroidism   Relevant Medications   levothyroxine (SYNTHROID) 200 MCG tablet   Other Relevant Orders   Lipid panel   Stable treated proliferative diabetic retinopathy of right eye determined by examination associated with type 1 diabetes mellitus (HCC)   Relevant Medications   insulin lispro (HUMALOG) 100 UNIT/ML injection   Stable treated proliferative diabetic retinopathy of left eye without macular edema determined  by examination associated with type 1 diabetes mellitus (HCC)   Relevant Medications   insulin lispro (HUMALOG) 100 UNIT/ML injection     Genitourinary   ESRD (end stage renal disease) on dialysis [facility])   Patient continues to see hematology and nephrology and endocrinology and they manage most things.  We mostly managed with thyroid.  His blood pressure is improved and that is mostly managed by nephrology.    Follow up plan: Return in about 6 months (around 08/21/2021), or if symptoms worsen or fail to improve, for thyroid, and diabetes and renal disease.  Counseling provided for all of the vaccine components Orders Placed This Encounter  Procedures   TSH   Lipid panel    Caryl Pina, MD [facility] 02/21/2021, 9:54 AM

## 2021-02-22 DIAGNOSIS — N2581 Secondary hyperparathyroidism of renal origin: Secondary | ICD-10-CM | POA: Diagnosis not present

## 2021-02-22 DIAGNOSIS — N186 End stage renal disease: Secondary | ICD-10-CM | POA: Diagnosis not present

## 2021-02-22 DIAGNOSIS — E8779 Other fluid overload: Secondary | ICD-10-CM | POA: Diagnosis not present

## 2021-02-22 DIAGNOSIS — D509 Iron deficiency anemia, unspecified: Secondary | ICD-10-CM | POA: Diagnosis not present

## 2021-02-22 DIAGNOSIS — D631 Anemia in chronic kidney disease: Secondary | ICD-10-CM | POA: Diagnosis not present

## 2021-02-22 LAB — LIPID PANEL
Chol/HDL Ratio: 1.8 ratio (ref 0.0–5.0)
Cholesterol, Total: 94 mg/dL — ABNORMAL LOW (ref 100–199)
HDL: 53 mg/dL (ref >39)
LDL Chol Calc (NIH): 25 mg/dL (ref 0–99)
Triglycerides: 78 mg/dL (ref 0–149)
VLDL Cholesterol Cal: 16 mg/dL (ref 5–40)

## 2021-02-22 LAB — TSH: TSH: 10.1 u[IU]/mL — ABNORMAL HIGH (ref 0.450–4.500)

## 2021-02-24 DIAGNOSIS — D509 Iron deficiency anemia, unspecified: Secondary | ICD-10-CM | POA: Diagnosis not present

## 2021-02-24 DIAGNOSIS — N186 End stage renal disease: Secondary | ICD-10-CM | POA: Diagnosis not present

## 2021-02-24 DIAGNOSIS — D631 Anemia in chronic kidney disease: Secondary | ICD-10-CM | POA: Diagnosis not present

## 2021-02-24 DIAGNOSIS — E8779 Other fluid overload: Secondary | ICD-10-CM | POA: Diagnosis not present

## 2021-02-24 DIAGNOSIS — N2581 Secondary hyperparathyroidism of renal origin: Secondary | ICD-10-CM | POA: Diagnosis not present

## 2021-02-27 DIAGNOSIS — E8779 Other fluid overload: Secondary | ICD-10-CM | POA: Diagnosis not present

## 2021-02-27 DIAGNOSIS — N2581 Secondary hyperparathyroidism of renal origin: Secondary | ICD-10-CM | POA: Diagnosis not present

## 2021-02-27 DIAGNOSIS — N186 End stage renal disease: Secondary | ICD-10-CM | POA: Diagnosis not present

## 2021-02-27 DIAGNOSIS — D509 Iron deficiency anemia, unspecified: Secondary | ICD-10-CM | POA: Diagnosis not present

## 2021-02-27 DIAGNOSIS — D631 Anemia in chronic kidney disease: Secondary | ICD-10-CM | POA: Diagnosis not present

## 2021-03-01 DIAGNOSIS — E8779 Other fluid overload: Secondary | ICD-10-CM | POA: Diagnosis not present

## 2021-03-01 DIAGNOSIS — D509 Iron deficiency anemia, unspecified: Secondary | ICD-10-CM | POA: Diagnosis not present

## 2021-03-01 DIAGNOSIS — N186 End stage renal disease: Secondary | ICD-10-CM | POA: Diagnosis not present

## 2021-03-01 DIAGNOSIS — R197 Diarrhea, unspecified: Secondary | ICD-10-CM | POA: Diagnosis not present

## 2021-03-01 DIAGNOSIS — N2581 Secondary hyperparathyroidism of renal origin: Secondary | ICD-10-CM | POA: Diagnosis not present

## 2021-03-01 DIAGNOSIS — D631 Anemia in chronic kidney disease: Secondary | ICD-10-CM | POA: Diagnosis not present

## 2021-03-03 DIAGNOSIS — N2581 Secondary hyperparathyroidism of renal origin: Secondary | ICD-10-CM | POA: Diagnosis not present

## 2021-03-03 DIAGNOSIS — N186 End stage renal disease: Secondary | ICD-10-CM | POA: Diagnosis not present

## 2021-03-03 DIAGNOSIS — E8779 Other fluid overload: Secondary | ICD-10-CM | POA: Diagnosis not present

## 2021-03-03 DIAGNOSIS — D631 Anemia in chronic kidney disease: Secondary | ICD-10-CM | POA: Diagnosis not present

## 2021-03-03 DIAGNOSIS — D509 Iron deficiency anemia, unspecified: Secondary | ICD-10-CM | POA: Diagnosis not present

## 2021-03-05 ENCOUNTER — Encounter: Payer: Self-pay | Admitting: Adult Health

## 2021-03-05 ENCOUNTER — Ambulatory Visit: Payer: Medicare Other | Admitting: Adult Health

## 2021-03-05 ENCOUNTER — Telehealth: Payer: Self-pay | Admitting: *Deleted

## 2021-03-05 ENCOUNTER — Other Ambulatory Visit: Payer: Self-pay | Admitting: Family Medicine

## 2021-03-05 ENCOUNTER — Other Ambulatory Visit: Payer: Self-pay

## 2021-03-05 DIAGNOSIS — D510 Vitamin B12 deficiency anemia due to intrinsic factor deficiency: Secondary | ICD-10-CM | POA: Diagnosis not present

## 2021-03-05 DIAGNOSIS — J45909 Unspecified asthma, uncomplicated: Secondary | ICD-10-CM | POA: Insufficient documentation

## 2021-03-05 DIAGNOSIS — G4733 Obstructive sleep apnea (adult) (pediatric): Secondary | ICD-10-CM | POA: Diagnosis not present

## 2021-03-05 DIAGNOSIS — J4531 Mild persistent asthma with (acute) exacerbation: Secondary | ICD-10-CM | POA: Diagnosis not present

## 2021-03-05 NOTE — Patient Instructions (Addendum)
Delsym 2 tsp Twice daily  for cough As needed   Tessalon 200mg  Three times a day  For cough as needed.  Sips of water , honey to soothe throat and avoid throat clearing /coughing  Albuterol inhaler As needed   Continue on Symbicort 2 puffs Twice daily,   rinse after use  Nexium daily  Xyzal daily  Continue on CPAP At bedtime. Need to wear every night all night long.  Work on healthy weight.  Do not drive if sleepy.  Follow up with Bathsheba Durrett NP in 4 months with PFTs  and As needed   Please contact office for sooner follow up if symptoms do not improve or worsen or seek emergency care

## 2021-03-05 NOTE — Progress Notes (Signed)
@Patient  ID: Patrick Brown, male    DOB: 1970/08/02, 51 y.o.   MRN: [id number]  Chief Complaint  Patient presents with   Follow-up    Referring provider: Dettinger, Fransisca Kaufmann, MD  HPI: 51 year old male never smoker followed for obstructive sleep apnea, asthma, chronic cough  Complicated medical history including HIV disease and end-stage renal disease on dialysis Patient is on dialysis Tuesday/Thursday/Saturday.  He is insulin-dependent diabetic with an insulin pump.  On Kidney transplant list at [facility] .  History of PCP pneumonia On Disability , former paramedic. Teaches ACLS classes.   TEST/EVENTS :  PSG 09/26/14 >> AHI 11.3, SpO2 low 86%, PLMI 51.9   Underwent FOB 12/15/12 w/ essentially unremarbkable airway and  cytology neg for malignant cells, neg AFB and fungal cx. (final).   CT sinus 06/24/20 >> negative CT chest 06/24/20 >> coronary calcification, calcified mediastinal and hilar LN, minimal pleuro-parenchymal scarring, basilar ATX, 2 mm nodule LUL  03/05/2021 Follow up: Asthma, Chronic Cough and OSA  Patient returns for a 6-week follow-up.  Patient was seen last visit for a slow to resolve asthmatic bronchitic exacerbation.  He was given doxycycline and a prednisone course.  Recommended on cough suppression regimen with Delsym and Tessalon Perles. Chest xray 01/01/21 was clear .  Patient says since last visit he is feeling Better.  He has decreased cough and congestion.  Patient says he still has some lingering dry cough that is helped quite a bit with Delsym and Tessalon.  He denies any increased albuterol use.  He remains on Symbicort twice daily. He denies any chest pain orthopnea PND or hemoptysis.  Patient has underlying obstructive sleep apnea.  Patient says he tries to wear his CPAP every night gets in about 6 to 7 hours.  CPAP download shows 87% compliance.  Daily average usage at 7.5 hours.  Patient is on auto CPAP 5 to 15 cm H2O.  AHI 7.1/hour.  Daily average  pressure at 13 cm/H2O.  He has underlying end-stage renal disease on dialysis Tuesday, Thursday and Saturday.  Patient says he is doing well with this.  He is on the transplant list at [facility].  Patient is followed by infectious disease with underlying HIV.  Patient has insulin-dependent diabetes.      Allergies  Allergen Reactions   Sulfa Antibiotics Other (See Comments)    High potassium   Ramipril Cough   Versed [Midazolam] Other (See Comments)    "I don't wake up very good or clear it out of my system"    Immunization History  Administered Date(s) Administered   Hepatitis A 12/23/2013, 09/05/2014, 11/18/2019   Hepatitis A, Adult 12/23/2013, 09/05/2014   Hepatitis B, adult 12/23/2013, 02/01/2014, 09/05/2014   Hepatitis B, ped/adol 12/23/2013, 02/01/2014, 09/05/2014   Influenza Split 11/05/2011   Influenza,inj,Quad PF,6+ Mos 11/15/2013, 11/28/2014, 11/10/2015, 11/13/2016, 10/27/2017, 10/07/2018, 11/26/2019, 11/15/2020   Influenza,inj,Quad PF,6-35 Mos 11/26/2019   Influenza-Unspecified 11/12/2012   Meningococcal Conjugate 03/26/2016, 10/27/2017   Meningococcal Mcv4o 03/26/2016, 10/27/2017   Moderna Covid-19 Vaccine Bivalent Booster 31yrs & up 12/08/2020   Moderna Sars-Covid-2 Vaccination 03/23/2019, 04/20/2019, 10/19/2019   Pneumococcal Conjugate-13 10/27/2017   Pneumococcal Polysaccharide-23 06/18/2012, 11/24/2018   Tdap 11/27/2010, 11/18/2019   Zoster Recombinat (Shingrix) 10/08/2019, 02/09/2020    Past Medical History:  Diagnosis Date   Anemia, iron deficiency On procrit   CAP (community acquired pneumonia)    CKD (chronic kidney disease) stage 3, GFR 30-59 ml/min (HCC)    Degenerative arthritis    Depression  Dyslipidemia    Gastroesophageal reflux disease    Gastroparesis diabeticorum ([place])    Hematuria, microscopic 10/09   work up negative (Dr. Amalia Hailey)   HIV positive [facility])    Hyperkalemia, diminished renal excretion 06/2011 secondary to TMP/SMZ; prior  secondary to  ARBS;    Known potassium excretory defect; history of recurrent hyperkalemia due to diabetic renal disease; ACE/ARB contraindicated; hyperkalemia 06/2011 secondary to TMP-SMZ   Hypothyroidism    IDDM (insulin dependent diabetes mellitus)    38 years   Low HDL (under 40)    Proteinuria    Retinopathy    x2   SIRS (systemic inflammatory response syndrome) ([place])     Tobacco History: Social History   Tobacco Use  Smoking Status Never  Smokeless Tobacco Never   Counseling given: Not Answered   Outpatient Medications Prior to Visit  Medication Sig Dispense Refill   acetaminophen (TYLENOL) 500 MG tablet Take 1,000 mg by mouth 2 (two) times daily.      acyclovir (ZOVIRAX) 400 MG tablet Takes mondays, wednesdays and fridays     AgaMatrix Ultra-Thin Lancets MISC TEST BS 4 TIMES A DAY AND AS NEEDED DX E10.65 400 each 3   albuterol (PROVENTIL) (2.5 MG/3ML) 0.083% nebulizer solution Take 3 mLs (2.5 mg total) by nebulization every 6 (six) hours as needed for wheezing or shortness of breath. 75 mL 12   albuterol (VENTOLIN HFA) 108 (90 Base) MCG/ACT inhaler Inhale 2 puffs into the lungs every 6 (six) hours as needed for wheezing or shortness of breath. 8 g 5   amLODipine (NORVASC) 10 MG tablet Take 5 mg by mouth at bedtime as needed.     aspirin 81 MG chewable tablet Chew 81 mg by mouth every morning.     benzonatate (TESSALON) 200 MG capsule Take 1 capsule (200 mg total) by mouth 3 (three) times daily as needed for cough. 30 capsule 2   budesonide-formoterol (SYMBICORT) 160-4.5 MCG/ACT inhaler Inhale 2 puffs into the lungs in the morning and at bedtime. 1 each 6   cyclobenzaprine (FLEXERIL) 10 MG tablet TAKE 1 TABLET BY MOUTH THREE TIMES A DAY AS NEEDED FOR MUSCLE SPASMS 30 tablet 3   diclofenac sodium (VOLTAREN) 1 % GEL Apply 2 g topically 4 (four) times daily. 350 g 3   diphenoxylate-atropine (LOMOTIL) 2.5-0.025 MG tablet Take 1 tablet by mouth 4 (four) times daily as needed for  diarrhea or loose stools. 60 tablet 2   DOVATO 50-300 MG tablet TAKE 1 TABLET BY MOUTH DAILY 30 tablet 3   DULoxetine (CYMBALTA) 30 MG capsule Take 1 capsule (30 mg total) by mouth daily. TAKE WITH THE 60MG  FOR TOTAL OF 90MG  90 capsule 3   DULoxetine (CYMBALTA) 60 MG capsule Take 1 capsule (60 mg total) by mouth daily. Take with 30 mg fot a total of 90mg  90 capsule 3   esomeprazole (NEXIUM) 40 MG capsule Take 1 capsule (40 mg total) by mouth daily. 90 capsule 3   febuxostat (ULORIC) 40 MG tablet Take 1 tablet (40 mg total) by mouth daily. 90 tablet 3   ferrous sulfate 325 (65 FE) MG tablet Take 650 mg by mouth daily with breakfast.      fluticasone (FLONASE) 50 MCG/ACT nasal spray SPRAY 2 SPRAYS INTO EACH NOSTRIL EVERY DAY 48 mL 1   furosemide (LASIX) 40 MG tablet Take 40 mg by mouth daily. 40 mg nightly  4   Glucosamine-Chondroit-Vit C-Mn (GLUCOSAMINE 1500 COMPLEX PO) Take 1 tablet by mouth 2 (two) times  daily.      glucosamine-chondroitin 500-400 MG tablet Take by mouth.      glucose blood (ONETOUCH VERIO) test strip TEST BLOOD SUGAR 4 TIMES DAILY AND AS NEEDED 300 each 4   hydrALAZINE (APRESOLINE) 100 MG tablet Take by mouth.     HYDROcodone bit-homatropine (HYCODAN) 5-1.5 MG/5ML syrup Take 5 mLs by mouth every 6 (six) hours as needed for cough. 240 mL 0   hyoscyamine (LEVSIN) 0.125 MG tablet TAKE 1 TABLET (0.125 MG TOTAL) BY MOUTH EVERY 4 (FOUR) HOURS AS NEEDED. 60 tablet 2   icosapent Ethyl (VASCEPA) 1 g capsule Take 2 capsules (2 g total) by mouth 2 (two) times daily. 120 capsule 3   insulin glargine (LANTUS) 100 UNIT/ML injection In the event of insulin pump failure, inject 8 units twice daily     insulin lispro (HUMALOG) 100 UNIT/ML injection USE 42 UNITS TO 120 UNITS PER PUMP DAILY AS DIRECTED 90 mL 3   ipratropium (ATROVENT) 0.03 % nasal spray USE 2 SPRAYS IN EACH NOSTRIL 2-3 TIMES DAILY 30 mL 1   levocetirizine (XYZAL) 5 MG tablet Take 1 tablet (5 mg total) by mouth every evening. 90  tablet 3   levothyroxine (SYNTHROID) 200 MCG tablet Take 1 tablet (200 mcg total) by mouth daily. 90 tablet 3   lipase/protease/amylase (CREON) 12000-38000 units CPEP capsule Take 2 capsules prior to meals and 1 capsule prior to snacks     LORazepam (ATIVAN) 0.5 MG tablet Take by mouth.     losartan (COZAAR) 100 MG tablet Take 100 mg by mouth daily.     meclizine (ANTIVERT) 12.5 MG tablet Take 1 tablet (12.5 mg total) by mouth 3 (three) times daily as needed for dizziness. 30 tablet 3   metoCLOPramide (REGLAN) 5 MG tablet Take 1 tablet (5 mg total) by mouth 3 (three) times daily before meals. 270 tablet 3   montelukast (SINGULAIR) 10 MG tablet Take 1 tablet (10 mg total) by mouth at bedtime. 30 tablet 6   niacin (NIASPAN) 1000 MG CR tablet TAKE 1 TABLET (1,000 MG TOTAL) BY MOUTH AT BEDTIME. 30 tablet 5   ondansetron (ZOFRAN) 4 MG tablet TAKE 1 TABLET BY MOUTH EVERY 8 HOURS AS NEEDED FOR NAUSEA 45 tablet 6   Probiotic Product (MISC INTESTINAL FLORA REGULAT) CAPS Take 1 capsule by mouth every morning.     promethazine (PHENERGAN) 25 MG suppository PLACE 1 SUPPOSITORY (25 MG TOTAL) RECTALLY EVERY 6 (SIX) HOURS AS NEEDED FOR NAUSEA OR VOMITING. 30 suppository 1   rosuvastatin (CRESTOR) 20 MG tablet Take 20 mg by mouth at bedtime.     sevelamer carbonate (RENVELA) 800 MG tablet Take 800 mg by mouth 3 (three) times daily. 2 tabs before each meal     tamsulosin (FLOMAX) 0.4 MG CAPS capsule Take 0.4 mg by mouth daily.     Testosterone 20.25 MG/ACT (1.62%) GEL APPLY 3 PUMPS DAILY AS DIRECTED 75 g 0   traZODone (DESYREL) 50 MG tablet Take by mouth.     No facility-administered medications prior to visit.     Review of Systems:   Constitutional:   No  weight loss, night sweats,  Fevers, chills,  +fatigue, or  lassitude.  HEENT:   No headaches,  Difficulty swallowing,  Tooth/dental problems, or  Sore throat,                No sneezing, itching, ear ache, nasal congestion, post nasal drip,   CV:  No  chest pain,  Orthopnea, PND,  swelling in lower extremities, anasarca, dizziness, palpitations, syncope.   GI  No heartburn, indigestion, abdominal pain, nausea, vomiting, diarrhea, change in bowel habits, loss of appetite, bloody stools.   Resp:   No excess mucus, no productive cough,  No non-productive cough,  No coughing up of blood.  No change in color of mucus.  No wheezing.  No chest wall deformity  Skin: no rash or lesions.  GU: no dysuria, change in color of urine, no urgency or frequency.  No flank pain, no hematuria   MS:  No joint pain or swelling.  No decreased range of motion.  No back pain.    Physical Exam  BP (!) 160/80 (BP Location: Right Arm, Patient Position: Sitting, Cuff Size: Normal)    Pulse 82    Temp (!) 97.5 F (36.4 C) (Oral)    Ht 5\' 10"  (1.778 m)    Wt 222 lb 3.2 oz (100.8 kg)    SpO2 97%    BMI 31.88 kg/m   GEN: A/Ox3; pleasant , NAD, well nourished    HEENT:  [place]/AT,   NOSE-clear, THROAT-clear, no lesions, no postnasal drip or exudate noted.   NECK:  Supple w/ fair ROM; no JVD; normal carotid impulses w/o bruits; no thyromegaly or nodules palpated; no lymphadenopathy.    RESP  Clear  P & A; w/o, wheezes/ rales/ or rhonchi. no accessory muscle use, no dullness to percussion  CARD:  RRR, no m/r/g,tr-1+ peripheral edema, pulses intact, no cyanosis or clubbing. Left Arm av fistula   GI:   Soft & nt; nml bowel sounds; no organomegaly or masses detected.   Musco: Warm bil, no deformities or joint swelling noted.   Neuro: alert, no focal deficits noted.    Skin: Warm, no lesions or rashes    Lab Results:  CBC   BMET   BNP No results found for: BNP    Imaging: No results found.  cefTRIAXone (ROCEPHIN) injection 1 g     Date Action Dose Route User   01/11/2021 1435 Given 1 g Intramuscular (Right Upper Outer Quadrant) Bullins, Jamie H, LPN      methylPREDNISolone acetate (DEPO-MEDROL) injection 80 mg     Date Action Dose Route User    01/11/2021 1438 Given 80 mg Intramuscular (Left Upper Outer Quadrant) Bullins, Jamie H, LPN       No flowsheet data found.  No results found for: NITRICOXIDE      Assessment & Plan:   OSA (obstructive sleep apnea) Cont on CPAP At bedtime    Plan  Patient Instructions  Delsym 2 tsp Twice daily  for cough As needed   Tessalon 200mg  Three times a day  For cough as needed.  Sips of water , honey to soothe throat and avoid throat clearing /coughing  Albuterol inhaler As needed   Continue on Symbicort 2 puffs Twice daily,   rinse after use  Nexium daily  Xyzal daily  Continue on CPAP At bedtime. Need to wear every night all night long.  Work on healthy weight.  Do not drive if sleepy.  Follow up with Kailani Brass NP in 4 months with PFTs  and As needed   Please contact office for sooner follow up if symptoms do not improve or worsen or seek emergency care        Asthmatic bronchitis Recent flare now improved  Check PFT on return  Cough control regimen   Plan  Patient Instructions  Delsym 2 tsp Twice daily  for  cough As needed   Tessalon 200mg  Three times a day  For cough as needed.  Sips of water , honey to soothe throat and avoid throat clearing /coughing  Albuterol inhaler As needed   Continue on Symbicort 2 puffs Twice daily,   rinse after use  Nexium daily  Xyzal daily  Continue on CPAP At bedtime. Need to wear every night all night long.  Work on healthy weight.  Do not drive if sleepy.  Follow up with Harout Scheurich NP in 4 months with PFTs  and As needed   Please contact office for sooner follow up if symptoms do not improve or worsen or seek emergency care          Rexene Edison, NP 03/05/2021

## 2021-03-05 NOTE — Assessment & Plan Note (Signed)
Recent flare now improved  Check PFT on return  Cough control regimen   Plan  Patient Instructions  Delsym 2 tsp Twice daily  for cough As needed   Tessalon 200mg  Three times a day  For cough as needed.  Sips of water , honey to soothe throat and avoid throat clearing /coughing  Albuterol inhaler As needed   Continue on Symbicort 2 puffs Twice daily,   rinse after use  Nexium daily  Xyzal daily  Continue on CPAP At bedtime. Need to wear every night all night long.  Work on healthy weight.  Do not drive if sleepy.  Follow up with Marshall Kampf NP in 4 months with PFTs  and As needed   Please contact office for sooner follow up if symptoms do not improve or worsen or seek emergency care

## 2021-03-05 NOTE — Telephone Encounter (Signed)
Patient in office today requesting to change providers from Dr. Halford Chessman to Dr. Elsworth Soho.  Please advise if ok.  Thank you.

## 2021-03-05 NOTE — Telephone Encounter (Signed)
I am okay with his changing.

## 2021-03-05 NOTE — Assessment & Plan Note (Signed)
Cont on CPAP At bedtime    Plan  Patient Instructions  Delsym 2 tsp Twice daily  for cough As needed   Tessalon 200mg  Three times a day  For cough as needed.  Sips of water , honey to soothe throat and avoid throat clearing /coughing  Albuterol inhaler As needed   Continue on Symbicort 2 puffs Twice daily,   rinse after use  Nexium daily  Xyzal daily  Continue on CPAP At bedtime. Need to wear every night all night long.  Work on healthy weight.  Do not drive if sleepy.  Follow up with Jahnaya Branscome NP in 4 months with PFTs  and As needed   Please contact office for sooner follow up if symptoms do not improve or worsen or seek emergency care

## 2021-03-06 DIAGNOSIS — D509 Iron deficiency anemia, unspecified: Secondary | ICD-10-CM | POA: Diagnosis not present

## 2021-03-06 DIAGNOSIS — D510 Vitamin B12 deficiency anemia due to intrinsic factor deficiency: Secondary | ICD-10-CM | POA: Diagnosis not present

## 2021-03-06 DIAGNOSIS — Z992 Dependence on renal dialysis: Secondary | ICD-10-CM | POA: Diagnosis not present

## 2021-03-06 DIAGNOSIS — E8779 Other fluid overload: Secondary | ICD-10-CM | POA: Diagnosis not present

## 2021-03-06 DIAGNOSIS — N186 End stage renal disease: Secondary | ICD-10-CM | POA: Diagnosis not present

## 2021-03-06 DIAGNOSIS — N2581 Secondary hyperparathyroidism of renal origin: Secondary | ICD-10-CM | POA: Diagnosis not present

## 2021-03-06 DIAGNOSIS — D631 Anemia in chronic kidney disease: Secondary | ICD-10-CM | POA: Diagnosis not present

## 2021-03-06 NOTE — Telephone Encounter (Signed)
Called and spoke with patient to let him know that Dr. Halford Chessman and Dr. Elsworth Soho are ok with the switch. Asked him if he was ok moving his appt and PFT up a month and seeing Dr. Elsworth Soho instead of Tammy again. Patient agreed. PFT and OV have been rescheduled. Nothing further needed at this time.   Next Appt With Pulmonology ([facility]) 05/30/2021 at 12:00 PM and OV at 1:30

## 2021-03-06 NOTE — Progress Notes (Signed)
Reviewed and agree with assessment/plan.   Chesley Mires, MD [facility] Pulmonary/Critical Care 03/06/2021, 11:01 AM Pager:  [phone]

## 2021-03-07 ENCOUNTER — Other Ambulatory Visit: Payer: Self-pay | Admitting: Family Medicine

## 2021-03-07 DIAGNOSIS — E349 Endocrine disorder, unspecified: Secondary | ICD-10-CM

## 2021-03-07 DIAGNOSIS — D510 Vitamin B12 deficiency anemia due to intrinsic factor deficiency: Secondary | ICD-10-CM | POA: Diagnosis not present

## 2021-03-07 NOTE — Telephone Encounter (Signed)
Last office visit 02/21/21 Last refill 08/14/20, 75 grams, no refills

## 2021-03-08 DIAGNOSIS — E119 Type 2 diabetes mellitus without complications: Secondary | ICD-10-CM | POA: Diagnosis not present

## 2021-03-08 DIAGNOSIS — D509 Iron deficiency anemia, unspecified: Secondary | ICD-10-CM | POA: Diagnosis not present

## 2021-03-08 DIAGNOSIS — N2581 Secondary hyperparathyroidism of renal origin: Secondary | ICD-10-CM | POA: Diagnosis not present

## 2021-03-08 DIAGNOSIS — E8779 Other fluid overload: Secondary | ICD-10-CM | POA: Diagnosis not present

## 2021-03-08 DIAGNOSIS — N186 End stage renal disease: Secondary | ICD-10-CM | POA: Diagnosis not present

## 2021-03-08 DIAGNOSIS — D510 Vitamin B12 deficiency anemia due to intrinsic factor deficiency: Secondary | ICD-10-CM | POA: Diagnosis not present

## 2021-03-08 DIAGNOSIS — D631 Anemia in chronic kidney disease: Secondary | ICD-10-CM | POA: Diagnosis not present

## 2021-03-09 DIAGNOSIS — D510 Vitamin B12 deficiency anemia due to intrinsic factor deficiency: Secondary | ICD-10-CM | POA: Diagnosis not present

## 2021-03-09 DIAGNOSIS — H04121 Dry eye syndrome of right lacrimal gland: Secondary | ICD-10-CM | POA: Diagnosis not present

## 2021-03-09 DIAGNOSIS — H179 Unspecified corneal scar and opacity: Secondary | ICD-10-CM | POA: Diagnosis not present

## 2021-03-10 DIAGNOSIS — E8779 Other fluid overload: Secondary | ICD-10-CM | POA: Diagnosis not present

## 2021-03-10 DIAGNOSIS — D509 Iron deficiency anemia, unspecified: Secondary | ICD-10-CM | POA: Diagnosis not present

## 2021-03-10 DIAGNOSIS — N186 End stage renal disease: Secondary | ICD-10-CM | POA: Diagnosis not present

## 2021-03-10 DIAGNOSIS — N2581 Secondary hyperparathyroidism of renal origin: Secondary | ICD-10-CM | POA: Diagnosis not present

## 2021-03-10 DIAGNOSIS — D631 Anemia in chronic kidney disease: Secondary | ICD-10-CM | POA: Diagnosis not present

## 2021-03-12 ENCOUNTER — Ambulatory Visit: Payer: Medicare Other | Admitting: Pulmonary Disease

## 2021-03-13 ENCOUNTER — Encounter: Payer: Self-pay | Admitting: Family Medicine

## 2021-03-13 ENCOUNTER — Other Ambulatory Visit: Payer: Self-pay | Admitting: *Deleted

## 2021-03-13 DIAGNOSIS — N2581 Secondary hyperparathyroidism of renal origin: Secondary | ICD-10-CM | POA: Diagnosis not present

## 2021-03-13 DIAGNOSIS — D631 Anemia in chronic kidney disease: Secondary | ICD-10-CM | POA: Diagnosis not present

## 2021-03-13 DIAGNOSIS — D509 Iron deficiency anemia, unspecified: Secondary | ICD-10-CM | POA: Diagnosis not present

## 2021-03-13 DIAGNOSIS — E8779 Other fluid overload: Secondary | ICD-10-CM | POA: Diagnosis not present

## 2021-03-13 DIAGNOSIS — N186 End stage renal disease: Secondary | ICD-10-CM | POA: Diagnosis not present

## 2021-03-13 MED ORDER — LEVOTHYROXINE SODIUM 25 MCG PO TABS: 12.5000 ug | ORAL_TABLET | Freq: Every day | ORAL | 1 refills | Status: DC

## 2021-03-14 DIAGNOSIS — I120 Hypertensive chronic kidney disease with stage 5 chronic kidney disease or end stage renal disease: Secondary | ICD-10-CM | POA: Diagnosis not present

## 2021-03-14 DIAGNOSIS — N186 End stage renal disease: Secondary | ICD-10-CM | POA: Diagnosis not present

## 2021-03-14 DIAGNOSIS — E1022 Type 1 diabetes mellitus with diabetic chronic kidney disease: Secondary | ICD-10-CM | POA: Diagnosis not present

## 2021-03-14 DIAGNOSIS — Z794 Long term (current) use of insulin: Secondary | ICD-10-CM | POA: Diagnosis not present

## 2021-03-14 DIAGNOSIS — Z79899 Other long term (current) drug therapy: Secondary | ICD-10-CM | POA: Diagnosis not present

## 2021-03-14 DIAGNOSIS — Z992 Dependence on renal dialysis: Secondary | ICD-10-CM | POA: Diagnosis not present

## 2021-03-15 DIAGNOSIS — N2581 Secondary hyperparathyroidism of renal origin: Secondary | ICD-10-CM | POA: Diagnosis not present

## 2021-03-15 DIAGNOSIS — E8779 Other fluid overload: Secondary | ICD-10-CM | POA: Diagnosis not present

## 2021-03-15 DIAGNOSIS — D631 Anemia in chronic kidney disease: Secondary | ICD-10-CM | POA: Diagnosis not present

## 2021-03-15 DIAGNOSIS — N186 End stage renal disease: Secondary | ICD-10-CM | POA: Diagnosis not present

## 2021-03-15 DIAGNOSIS — D509 Iron deficiency anemia, unspecified: Secondary | ICD-10-CM | POA: Diagnosis not present

## 2021-03-16 DIAGNOSIS — D510 Vitamin B12 deficiency anemia due to intrinsic factor deficiency: Secondary | ICD-10-CM | POA: Diagnosis not present

## 2021-03-20 DIAGNOSIS — N186 End stage renal disease: Secondary | ICD-10-CM | POA: Diagnosis not present

## 2021-03-20 DIAGNOSIS — D509 Iron deficiency anemia, unspecified: Secondary | ICD-10-CM | POA: Diagnosis not present

## 2021-03-20 DIAGNOSIS — D631 Anemia in chronic kidney disease: Secondary | ICD-10-CM | POA: Diagnosis not present

## 2021-03-20 DIAGNOSIS — E8779 Other fluid overload: Secondary | ICD-10-CM | POA: Diagnosis not present

## 2021-03-20 DIAGNOSIS — N2581 Secondary hyperparathyroidism of renal origin: Secondary | ICD-10-CM | POA: Diagnosis not present

## 2021-03-21 ENCOUNTER — Other Ambulatory Visit: Payer: Self-pay

## 2021-03-21 ENCOUNTER — Ambulatory Visit: Payer: Medicare Other | Admitting: Internal Medicine

## 2021-03-21 ENCOUNTER — Encounter: Payer: Self-pay | Admitting: Family Medicine

## 2021-03-21 VITALS — BP 188/87 | HR 88 | Resp 16 | Ht 70.0 in | Wt 226.0 lb

## 2021-03-21 DIAGNOSIS — B20 Human immunodeficiency virus [HIV] disease: Secondary | ICD-10-CM

## 2021-03-21 DIAGNOSIS — Z113 Encounter for screening for infections with a predominantly sexual mode of transmission: Secondary | ICD-10-CM | POA: Diagnosis not present

## 2021-03-21 DIAGNOSIS — Z992 Dependence on renal dialysis: Secondary | ICD-10-CM

## 2021-03-21 DIAGNOSIS — N186 End stage renal disease: Secondary | ICD-10-CM | POA: Diagnosis not present

## 2021-03-21 MED ORDER — ROSUVASTATIN CALCIUM 20 MG PO TABS: 20.0000 mg | ORAL_TABLET | Freq: Every evening | ORAL | 3 refills | Status: AC

## 2021-03-21 NOTE — Progress Notes (Signed)
° °  Subjective:    Patient ID: Patrick Brown, male    DOB: 1970/09/22, 51 y.o.   MRN: [id number]  HPI Here for follow up of HIV He continues on Dovato and denies any missed doses.  He is followed by the transplant team at [facility] and has follow up next month with them.  He has had no new issues related to his HIV.  He is here today with his mother.     Review of Systems  Constitutional:  Negative for fatigue.  Gastrointestinal:  Negative for diarrhea and nausea.  Skin:  Negative for rash.      Objective:   Physical Exam Eyes:     General: No scleral icterus. Pulmonary:     Effort: Pulmonary effort is normal.  Skin:    Findings: No rash.  Neurological:     General: No focal deficit present.     Mental Status: He is alert.  Psychiatric:        Mood and Affect: Mood normal.   SH: no tobacco       Assessment & Plan:

## 2021-03-22 ENCOUNTER — Encounter: Payer: Self-pay | Admitting: Internal Medicine

## 2021-03-22 DIAGNOSIS — N2581 Secondary hyperparathyroidism of renal origin: Secondary | ICD-10-CM | POA: Diagnosis not present

## 2021-03-22 DIAGNOSIS — D509 Iron deficiency anemia, unspecified: Secondary | ICD-10-CM | POA: Diagnosis not present

## 2021-03-22 DIAGNOSIS — N186 End stage renal disease: Secondary | ICD-10-CM | POA: Diagnosis not present

## 2021-03-22 DIAGNOSIS — D631 Anemia in chronic kidney disease: Secondary | ICD-10-CM | POA: Diagnosis not present

## 2021-03-22 DIAGNOSIS — E8779 Other fluid overload: Secondary | ICD-10-CM | POA: Diagnosis not present

## 2021-03-22 NOTE — Assessment & Plan Note (Signed)
Will screen 

## 2021-03-22 NOTE — Assessment & Plan Note (Signed)
He continues to do well on Dovato and no changes indicated.   I discussed Prevnar 20 with him and he prefers to defer.  He will discuss it with the transplant team.   He can follow up with me in 1 year.

## 2021-03-22 NOTE — Assessment & Plan Note (Signed)
He continues on dialysis and followed by the transplant team.

## 2021-03-23 DIAGNOSIS — R634 Abnormal weight loss: Secondary | ICD-10-CM | POA: Insufficient documentation

## 2021-03-23 DIAGNOSIS — K625 Hemorrhage of anus and rectum: Secondary | ICD-10-CM | POA: Insufficient documentation

## 2021-03-23 DIAGNOSIS — R140 Abdominal distension (gaseous): Secondary | ICD-10-CM | POA: Insufficient documentation

## 2021-03-23 DIAGNOSIS — D510 Vitamin B12 deficiency anemia due to intrinsic factor deficiency: Secondary | ICD-10-CM | POA: Diagnosis not present

## 2021-03-23 DIAGNOSIS — R194 Change in bowel habit: Secondary | ICD-10-CM | POA: Insufficient documentation

## 2021-03-23 DIAGNOSIS — K5904 Chronic idiopathic constipation: Secondary | ICD-10-CM | POA: Insufficient documentation

## 2021-03-23 LAB — HIV-1 RNA QUANT-NO REFLEX-BLD
HIV 1 RNA Quant: NOT DETECTED Copies/mL
HIV-1 RNA Quant, Log: NOT DETECTED Log cps/mL

## 2021-03-23 LAB — T-HELPER CELLS (CD4) COUNT (NOT AT ARMC)
Absolute CD4: 310 cells/uL — ABNORMAL LOW (ref 490–1740)
CD4 T Helper %: 22 % — ABNORMAL LOW (ref 30–61)
Total lymphocyte count: 1382 cells/uL (ref 850–3900)

## 2021-03-23 LAB — RPR: RPR Ser Ql: NONREACTIVE

## 2021-03-24 DIAGNOSIS — E8779 Other fluid overload: Secondary | ICD-10-CM | POA: Diagnosis not present

## 2021-03-24 DIAGNOSIS — D631 Anemia in chronic kidney disease: Secondary | ICD-10-CM | POA: Diagnosis not present

## 2021-03-24 DIAGNOSIS — D509 Iron deficiency anemia, unspecified: Secondary | ICD-10-CM | POA: Diagnosis not present

## 2021-03-24 DIAGNOSIS — N186 End stage renal disease: Secondary | ICD-10-CM | POA: Diagnosis not present

## 2021-03-24 DIAGNOSIS — N2581 Secondary hyperparathyroidism of renal origin: Secondary | ICD-10-CM | POA: Diagnosis not present

## 2021-03-26 DIAGNOSIS — Z79899 Other long term (current) drug therapy: Secondary | ICD-10-CM | POA: Diagnosis not present

## 2021-03-26 DIAGNOSIS — K219 Gastro-esophageal reflux disease without esophagitis: Secondary | ICD-10-CM | POA: Diagnosis not present

## 2021-03-26 DIAGNOSIS — Z833 Family history of diabetes mellitus: Secondary | ICD-10-CM | POA: Diagnosis not present

## 2021-03-26 DIAGNOSIS — I130 Hypertensive heart and chronic kidney disease with heart failure and stage 1 through stage 4 chronic kidney disease, or unspecified chronic kidney disease: Secondary | ICD-10-CM | POA: Diagnosis not present

## 2021-03-26 DIAGNOSIS — E079 Disorder of thyroid, unspecified: Secondary | ICD-10-CM | POA: Diagnosis not present

## 2021-03-26 DIAGNOSIS — E785 Hyperlipidemia, unspecified: Secondary | ICD-10-CM | POA: Diagnosis not present

## 2021-03-26 DIAGNOSIS — Z823 Family history of stroke: Secondary | ICD-10-CM | POA: Diagnosis not present

## 2021-03-26 DIAGNOSIS — Z9641 Presence of insulin pump (external) (internal): Secondary | ICD-10-CM | POA: Diagnosis not present

## 2021-03-26 DIAGNOSIS — N186 End stage renal disease: Secondary | ICD-10-CM | POA: Diagnosis not present

## 2021-03-26 DIAGNOSIS — Z7982 Long term (current) use of aspirin: Secondary | ICD-10-CM | POA: Diagnosis not present

## 2021-03-26 DIAGNOSIS — Z888 Allergy status to other drugs, medicaments and biological substances status: Secondary | ICD-10-CM | POA: Diagnosis not present

## 2021-03-26 DIAGNOSIS — Z8249 Family history of ischemic heart disease and other diseases of the circulatory system: Secondary | ICD-10-CM | POA: Diagnosis not present

## 2021-03-26 DIAGNOSIS — E1122 Type 2 diabetes mellitus with diabetic chronic kidney disease: Secondary | ICD-10-CM | POA: Diagnosis not present

## 2021-03-26 DIAGNOSIS — Z882 Allergy status to sulfonamides status: Secondary | ICD-10-CM | POA: Diagnosis not present

## 2021-03-26 DIAGNOSIS — Z992 Dependence on renal dialysis: Secondary | ICD-10-CM | POA: Diagnosis not present

## 2021-03-26 DIAGNOSIS — T82868A Thrombosis of vascular prosthetic devices, implants and grafts, initial encounter: Secondary | ICD-10-CM | POA: Diagnosis not present

## 2021-03-27 DIAGNOSIS — D631 Anemia in chronic kidney disease: Secondary | ICD-10-CM | POA: Diagnosis not present

## 2021-03-27 DIAGNOSIS — D509 Iron deficiency anemia, unspecified: Secondary | ICD-10-CM | POA: Diagnosis not present

## 2021-03-27 DIAGNOSIS — E8779 Other fluid overload: Secondary | ICD-10-CM | POA: Diagnosis not present

## 2021-03-27 DIAGNOSIS — N186 End stage renal disease: Secondary | ICD-10-CM | POA: Diagnosis not present

## 2021-03-27 DIAGNOSIS — N2581 Secondary hyperparathyroidism of renal origin: Secondary | ICD-10-CM | POA: Diagnosis not present

## 2021-03-29 DIAGNOSIS — D631 Anemia in chronic kidney disease: Secondary | ICD-10-CM | POA: Diagnosis not present

## 2021-03-29 DIAGNOSIS — E8779 Other fluid overload: Secondary | ICD-10-CM | POA: Diagnosis not present

## 2021-03-29 DIAGNOSIS — N186 End stage renal disease: Secondary | ICD-10-CM | POA: Diagnosis not present

## 2021-03-29 DIAGNOSIS — N2581 Secondary hyperparathyroidism of renal origin: Secondary | ICD-10-CM | POA: Diagnosis not present

## 2021-03-29 DIAGNOSIS — D509 Iron deficiency anemia, unspecified: Secondary | ICD-10-CM | POA: Diagnosis not present

## 2021-03-31 DIAGNOSIS — E8779 Other fluid overload: Secondary | ICD-10-CM | POA: Diagnosis not present

## 2021-03-31 DIAGNOSIS — D509 Iron deficiency anemia, unspecified: Secondary | ICD-10-CM | POA: Diagnosis not present

## 2021-03-31 DIAGNOSIS — N186 End stage renal disease: Secondary | ICD-10-CM | POA: Diagnosis not present

## 2021-03-31 DIAGNOSIS — N2581 Secondary hyperparathyroidism of renal origin: Secondary | ICD-10-CM | POA: Diagnosis not present

## 2021-03-31 DIAGNOSIS — D631 Anemia in chronic kidney disease: Secondary | ICD-10-CM | POA: Diagnosis not present

## 2021-04-02 ENCOUNTER — Encounter: Payer: Self-pay | Admitting: Family Medicine

## 2021-04-02 ENCOUNTER — Ambulatory Visit: Payer: Medicare Other | Admitting: Adult Health

## 2021-04-02 DIAGNOSIS — R630 Anorexia: Secondary | ICD-10-CM

## 2021-04-03 DIAGNOSIS — N2581 Secondary hyperparathyroidism of renal origin: Secondary | ICD-10-CM | POA: Diagnosis not present

## 2021-04-03 DIAGNOSIS — D509 Iron deficiency anemia, unspecified: Secondary | ICD-10-CM | POA: Diagnosis not present

## 2021-04-03 DIAGNOSIS — N186 End stage renal disease: Secondary | ICD-10-CM | POA: Diagnosis not present

## 2021-04-03 DIAGNOSIS — D631 Anemia in chronic kidney disease: Secondary | ICD-10-CM | POA: Diagnosis not present

## 2021-04-03 DIAGNOSIS — Z992 Dependence on renal dialysis: Secondary | ICD-10-CM | POA: Diagnosis not present

## 2021-04-03 DIAGNOSIS — E8779 Other fluid overload: Secondary | ICD-10-CM | POA: Diagnosis not present

## 2021-04-04 DIAGNOSIS — F330 Major depressive disorder, recurrent, mild: Secondary | ICD-10-CM | POA: Insufficient documentation

## 2021-04-04 DIAGNOSIS — J302 Other seasonal allergic rhinitis: Secondary | ICD-10-CM | POA: Insufficient documentation

## 2021-04-04 MED ORDER — MIRTAZAPINE 15 MG PO TABS: 15.0000 mg | ORAL_TABLET | Freq: Every day | ORAL | 2 refills | Status: DC

## 2021-04-06 ENCOUNTER — Ambulatory Visit (INDEPENDENT_AMBULATORY_CARE_PROVIDER_SITE_OTHER): Payer: Medicare Other

## 2021-04-06 ENCOUNTER — Ambulatory Visit (INDEPENDENT_AMBULATORY_CARE_PROVIDER_SITE_OTHER): Payer: Medicare Other | Admitting: Family Medicine

## 2021-04-06 ENCOUNTER — Encounter: Payer: Self-pay | Admitting: Family Medicine

## 2021-04-06 VITALS — BP 188/79 | HR 59 | Ht 70.0 in | Wt 218.0 lb

## 2021-04-06 DIAGNOSIS — R053 Chronic cough: Secondary | ICD-10-CM

## 2021-04-06 DIAGNOSIS — J810 Acute pulmonary edema: Secondary | ICD-10-CM | POA: Diagnosis not present

## 2021-04-06 DIAGNOSIS — J90 Pleural effusion, not elsewhere classified: Secondary | ICD-10-CM | POA: Diagnosis not present

## 2021-04-06 DIAGNOSIS — D510 Vitamin B12 deficiency anemia due to intrinsic factor deficiency: Secondary | ICD-10-CM | POA: Diagnosis not present

## 2021-04-06 DIAGNOSIS — J189 Pneumonia, unspecified organism: Secondary | ICD-10-CM

## 2021-04-06 MED ORDER — CEFTRIAXONE SODIUM 1 G IJ SOLR
1.0000 g | Freq: Once | INTRAMUSCULAR | Status: AC
  Administered 2021-04-06: 1 g via INTRAMUSCULAR

## 2021-04-06 MED ORDER — CEFDINIR 300 MG PO CAPS: 300.0000 mg | ORAL_CAPSULE | Freq: Two times a day (BID) | ORAL | 0 refills | Status: DC

## 2021-04-06 MED ORDER — BENZONATATE 200 MG PO CAPS: 200.0000 mg | ORAL_CAPSULE | Freq: Three times a day (TID) | ORAL | 2 refills | Status: DC | PRN

## 2021-04-06 NOTE — Progress Notes (Signed)
? ?BP (!) 188/79   Pulse (!) 59   Ht 5\' 10"  (1.778 m)   Wt 218 lb (98.9 kg)   SpO2 100%   BMI 31.28 kg/m?   ? ?Subjective:  ? ?Patient ID: Patrick Brown, male    DOB: 05/12/1970, 51 y.o.   MRN: [id number] ? ?HPI: ?Patrick Brown is a 51 y.o. male presenting on 04/06/2021 for Cough ? ? ?HPI ?Cough ?Patient is coming in today with complaints of a cough that is been going on for 2 or 3 weeks.  He says it feels like it is just not improving and worsening.  He has tried Delsym and Tessalon and breathing treatments and humidifiers and they are just not helping as much.  The breathing treatments do help a little bit but he just does not seem to be clearing it.  He does admit that he had missed dialysis episode today and that might be part of the reason why his blood pressure is up although they have increased his blood pressure medicines recently as well.  He denies any fevers or chills. ? ?Relevant past medical, surgical, family and social history reviewed and updated as indicated. Interim medical history since our last visit reviewed. ?Allergies and medications reviewed and updated. ? ?Review of Systems  ?Constitutional:  Negative for chills and fever.  ?HENT:  Positive for congestion and ear pain. Negative for ear discharge, sinus pressure, sinus pain, sneezing, sore throat and trouble swallowing.   ?Respiratory:  Positive for cough. Negative for shortness of breath and wheezing.   ?Cardiovascular:  Negative for chest pain and leg swelling.  ?Musculoskeletal:  Negative for back pain and gait problem.  ?Skin:  Negative for rash.  ?All other systems reviewed and are negative. ? ?Per HPI unless specifically indicated above ? ? ? ? ? ? ?Objective:  ? ?BP (!) 188/79   Pulse (!) 59   Ht 5\' 10"  (1.778 m)   Wt 218 lb (98.9 kg)   SpO2 100%   BMI 31.28 kg/m?   ?Wt Readings from Last 3 Encounters:  ?04/06/21 218 lb (98.9 kg)  ?03/21/21 226 lb (102.5 kg)  ?03/05/21 222 lb 3.2 oz (100.8 kg)  ?  ?Physical Exam ?Vitals and  nursing note reviewed.  ?Constitutional:   ?   General: He is not in acute distress. ?   Appearance: He is well-developed. He is not diaphoretic.  ?HENT:  ?   Right Ear: Ear canal and external ear normal. Tympanic membrane is bulging. Tympanic membrane is not injected, scarred, perforated, erythematous or retracted.  ?   Left Ear: Ear canal and external ear normal. Tympanic membrane is bulging. Tympanic membrane is not injected, scarred, perforated, erythematous or retracted.  ?   Nose: Mucosal edema present.  ?   Right Sinus: Maxillary sinus tenderness present. No frontal sinus tenderness.  ?   Left Sinus: Maxillary sinus tenderness present. No frontal sinus tenderness.  ?   Mouth/Throat:  ?   Pharynx: Uvula midline. No oropharyngeal exudate or posterior oropharyngeal erythema.  ?   Tonsils: No tonsillar abscesses.  ?Eyes:  ?   General: No scleral icterus.    ?   Right eye: No discharge.  ?   Conjunctiva/sclera: Conjunctivae normal.  ?   Pupils: Pupils are equal, round, and reactive to light.  ?Neck:  ?   Thyroid: No thyromegaly.  ?Cardiovascular:  ?   Rate and Rhythm: Normal rate and regular rhythm.  ?   Heart sounds: Normal heart sounds. No  murmur heard. ?Pulmonary:  ?   Effort: Pulmonary effort is normal. No respiratory distress.  ?   Breath sounds: Normal breath sounds. No wheezing, rhonchi or rales.  ?Chest:  ?   Chest wall: No tenderness.  ?Musculoskeletal:     ?   General: Swelling (Trace) present. Normal range of motion.  ?   Cervical back: Neck supple.  ?Lymphadenopathy:  ?   Cervical: No cervical adenopathy.  ?Skin: ?   General: Skin is warm and dry.  ?   Findings: No rash.  ?Neurological:  ?   Mental Status: He is alert and oriented to person, place, and time.  ?   Coordination: Coordination normal.  ?Psychiatric:     ?   Behavior: Behavior normal.  ? ? ?Chest x-ray: Fluid in the left lower lung base ? ?Assessment & Plan:  ? ?Problem List Items Addressed This Visit   ?None ?Visit Diagnoses   ? ? Chronic  cough    -  Primary  ? Relevant Medications  ? benzonatate (TESSALON) 200 MG capsule  ? cefTRIAXone (ROCEPHIN) injection 1 g (Start on 04/06/2021  2:15 PM)  ? cefdinir (OMNICEF) 300 MG capsule  ? Other Relevant Orders  ? DG Chest 2 View  ? Community acquired pneumonia of left lower lobe of lung      ? Relevant Medications  ? benzonatate (TESSALON) 200 MG capsule  ? cefTRIAXone (ROCEPHIN) injection 1 g (Start on 04/06/2021  2:15 PM)  ? cefdinir (OMNICEF) 300 MG capsule  ? ?  ?Concern for possible left lower lobe pneumonia versus congestive heart failure.  His weight is actually down from before and he does not show any signs of major fluid overload or edema.  He does have a trace amount edema but he did miss dialysis. ? ?Follow up plan: ?Return if symptoms worsen or fail to improve. ? ?Counseling provided for all of the vaccine components ?Orders Placed This Encounter  ?Procedures  ? DG Chest 2 View  ? ? ?Caryl Pina, MD ?[place] ?04/06/2021, 2:09 PM ? ? ? ? ?

## 2021-04-07 DIAGNOSIS — E8779 Other fluid overload: Secondary | ICD-10-CM | POA: Diagnosis not present

## 2021-04-07 DIAGNOSIS — N2581 Secondary hyperparathyroidism of renal origin: Secondary | ICD-10-CM | POA: Diagnosis not present

## 2021-04-07 DIAGNOSIS — N186 End stage renal disease: Secondary | ICD-10-CM | POA: Diagnosis not present

## 2021-04-07 DIAGNOSIS — D509 Iron deficiency anemia, unspecified: Secondary | ICD-10-CM | POA: Diagnosis not present

## 2021-04-07 DIAGNOSIS — D631 Anemia in chronic kidney disease: Secondary | ICD-10-CM | POA: Diagnosis not present

## 2021-04-07 LAB — BRAIN NATRIURETIC PEPTIDE: BNP: 1422.3 pg/mL — ABNORMAL HIGH (ref 0.0–100.0)

## 2021-04-09 ENCOUNTER — Telehealth: Payer: Self-pay | Admitting: Family Medicine

## 2021-04-09 ENCOUNTER — Other Ambulatory Visit: Payer: Self-pay | Admitting: Family Medicine

## 2021-04-09 ENCOUNTER — Encounter: Payer: Self-pay | Admitting: Family Medicine

## 2021-04-09 DIAGNOSIS — J810 Acute pulmonary edema: Secondary | ICD-10-CM

## 2021-04-09 NOTE — Progress Notes (Signed)
Please let him know that I have ordered an echocardiogram for him, I do not know how soon this will be done but we usually do and we have concerns for congestive heart failure as we give a diuretic but I do not know that that will do any good with him being on dialysis, he may just have to call up his dialysis team and get that done sooner to get the fluid off.  If he continues to feel worse he may have to go to the emergency department if they cannot do dialysis sooner. ?

## 2021-04-09 NOTE — Progress Notes (Signed)
Pt informed of Dettinger's recommendations. He will discuss with dialysis tomorrow at his appt. Pt aware to go to ER if he begins to feel worse. ?

## 2021-04-09 NOTE — Telephone Encounter (Signed)
We will let see how he does now that he got some fluid off of him over the next day or 2. ?

## 2021-04-09 NOTE — Telephone Encounter (Signed)
Patient is not feeling any better and would like for someone to contact him  ?

## 2021-04-09 NOTE — Telephone Encounter (Signed)
Pt felt better over the weekend but now feels worse than he did at his last visit. His cough is much better but he just feels drained. Has no energy. He denies any fever. Pt is taking medications as prescribed. Pt has dialysis in the morning. ?

## 2021-04-10 DIAGNOSIS — N2581 Secondary hyperparathyroidism of renal origin: Secondary | ICD-10-CM | POA: Diagnosis not present

## 2021-04-10 DIAGNOSIS — D631 Anemia in chronic kidney disease: Secondary | ICD-10-CM | POA: Diagnosis not present

## 2021-04-10 DIAGNOSIS — E8779 Other fluid overload: Secondary | ICD-10-CM | POA: Diagnosis not present

## 2021-04-10 DIAGNOSIS — D509 Iron deficiency anemia, unspecified: Secondary | ICD-10-CM | POA: Diagnosis not present

## 2021-04-10 DIAGNOSIS — N186 End stage renal disease: Secondary | ICD-10-CM | POA: Diagnosis not present

## 2021-04-11 ENCOUNTER — Encounter: Payer: Self-pay | Admitting: Primary Care

## 2021-04-11 ENCOUNTER — Ambulatory Visit: Payer: Medicare Other | Admitting: Primary Care

## 2021-04-11 ENCOUNTER — Telehealth: Payer: Self-pay | Admitting: Primary Care

## 2021-04-11 ENCOUNTER — Other Ambulatory Visit: Payer: Self-pay

## 2021-04-11 ENCOUNTER — Ambulatory Visit (INDEPENDENT_AMBULATORY_CARE_PROVIDER_SITE_OTHER): Payer: Medicare Other

## 2021-04-11 VITALS — BP 160/90 | HR 61 | Temp 98.6°F | Ht 70.0 in | Wt 218.0 lb

## 2021-04-11 DIAGNOSIS — Z992 Dependence on renal dialysis: Secondary | ICD-10-CM

## 2021-04-11 DIAGNOSIS — R7989 Other specified abnormal findings of blood chemistry: Secondary | ICD-10-CM | POA: Diagnosis not present

## 2021-04-11 DIAGNOSIS — R053 Chronic cough: Secondary | ICD-10-CM | POA: Diagnosis not present

## 2021-04-11 DIAGNOSIS — R059 Cough, unspecified: Secondary | ICD-10-CM | POA: Diagnosis not present

## 2021-04-11 DIAGNOSIS — J189 Pneumonia, unspecified organism: Secondary | ICD-10-CM

## 2021-04-11 DIAGNOSIS — J90 Pleural effusion, not elsewhere classified: Secondary | ICD-10-CM | POA: Diagnosis not present

## 2021-04-11 DIAGNOSIS — N186 End stage renal disease: Secondary | ICD-10-CM

## 2021-04-11 LAB — BASIC METABOLIC PANEL
BUN: 27 mg/dL — ABNORMAL HIGH (ref 6–23)
CO2: 29 mEq/L (ref 19–32)
Calcium: 9.5 mg/dL (ref 8.4–10.5)
Chloride: 97 mEq/L (ref 96–112)
Creatinine, Ser: 7.04 mg/dL (ref 0.40–1.50)
GFR: 8.33 mL/min — CL (ref >60.00)
Glucose, Bld: 93 mg/dL (ref 70–99)
Potassium: 3.7 mEq/L (ref 3.5–5.1)
Sodium: 137 mEq/L (ref 135–145)

## 2021-04-11 LAB — BRAIN NATRIURETIC PEPTIDE: Pro B Natriuretic peptide (BNP): 2360 pg/mL — ABNORMAL HIGH (ref 0.0–100.0)

## 2021-04-11 MED ORDER — MONTELUKAST SODIUM 10 MG PO TABS
10.0000 mg | ORAL_TABLET | Freq: Every day | ORAL | 6 refills | Status: DC
Start: 2021-04-11 — End: 2021-04-16

## 2021-04-11 MED ORDER — BUDESONIDE-FORMOTEROL FUMARATE 160-4.5 MCG/ACT IN AERO: 2.0000 | INHALATION_SPRAY | Freq: Two times a day (BID) | RESPIRATORY_TRACT | 6 refills | Status: DC

## 2021-04-11 MED ORDER — ALBUTEROL SULFATE (2.5 MG/3ML) 0.083% IN NEBU: 2.5000 mg | INHALATION_SOLUTION | Freq: Four times a day (QID) | RESPIRATORY_TRACT | 12 refills | Status: AC | PRN

## 2021-04-11 MED ORDER — ALBUTEROL SULFATE HFA 108 (90 BASE) MCG/ACT IN AERS: 2.0000 | INHALATION_SPRAY | Freq: Four times a day (QID) | RESPIRATORY_TRACT | 5 refills | Status: AC | PRN

## 2021-04-11 NOTE — Assessment & Plan Note (Addendum)
-   Concern HF, systolic function was normal in 2014. PCP ordered for echocardiogram / needs to be scheduled. Referring to cardiology  ?

## 2021-04-11 NOTE — Patient Instructions (Addendum)
Recommendations: ?No changes to medications today, holding off on steroids at this time  ?Continue Omnicef as directed x 10 days until complete  ?Continue Delsym and tessalon Perles ?Continue Symbicort twice daily ?Continue Albuterol every 6 hours as needed for shortness of breath/wheezing/cough  ? ?Orders: ?Labs today (ordered) ?CXR re: cough/CAP (ordered)  ? ?Follow-up: ?April 26th at 12pm for breathing test and then visit 1:30 with Dr. Elsworth Soho  ?

## 2021-04-11 NOTE — Progress Notes (Signed)
$'@Patient'H$  ID: Patrick Brown, male    DOB: 1970/09/22, 51 y.o.   MRN: [id number]  Chief Complaint  Patient presents with   Acute Visit    Pt states cough has been occurring for a few weeks now and the past two weeks has gotten worse. Treated for PNA by PCP. Dry cough no mucus production. Sometimes it sounds congestion     Referring provider: Dettinger, Fransisca Kaufmann, MD  HPI: 51 year old male never smoker with complicated medical history including HIV disease, end-stage renal disease on dialysis (Tuesday/Thursday/Saturday) insulin-dependent diabetes-insulin pump . OSA on CPAP  History of PCP pneumonia  Previous [facility] pulmonary encounter: 06/09/2020 Follow up : Cough  Patient returns with ongoing cough that is minimally productive has some intermittent episodes of low-grade fevers..  Patient complains of 4 weeks of ongoing cough.  Has associated postnasal drainage.  Was initially seen by his primary care provider and given treatment for allergic rhinitis.  Patient said cough continued.  He was seen in our office on May 2.  Started on albuterol inhaler and recommend to use [employer]  Patient did have ongoing cough and was called in a prednisone taper 2 days ago.  Patient has seen a slight improvement in cough but continues to have ongoing coughing episodes.  Patient complains that his cough is very severe in nature.  Has a violent coughing fits.  Has been using Tessalon and Delsym intermittently without much relief.  Has also used some honey and did get a prescription for hydrocodone cough syrup which has helped some. Patient denies any hemoptysis, chest pain, orthopnea, edema.  Chest x-ray on April 14 showed clear lungs.  Patient has not been COVID tested.  Is fully vaccinated including booster x1  Has End-stage renal disease on dialysis, patient goes Tuesday Thursday and Saturday.  He is currently on the active kidney transplant list. Patient does have HIV disease is followed by infectious disease.   He endorses compliance with his antivirals.  Appetite has been good with no nausea vomiting or diarrhea.   06/14/2020 Patient presents today for acute visit. He was seen 5 days ago in office and treated for bronchitis symptoms with Doxycycline '100mg'$  BID x 7 days and instructed to finish prednisone taper that was prescribed back on 06/07/20. CXR on 06/09/20 showed increased opacification along the left heart border. He finished prednisone today. He has 2-3 days of Doxycycline left. He continues to not feel well. He has a severly congested cough which is non-productive. He takes care of his mother who currently has a sinus infection. He has been tested for covid which came back negative. He denies nasal congestion, he is taking xyzal. Unable to do sputum culture d/t cough being non-productive. BS running 200s.    03/05/2021 Follow up: Asthma, Chronic Cough and OSA  Patient returns for a 6-week follow-up.  Patient was seen last visit for a slow to resolve asthmatic bronchitic exacerbation.  He was given doxycycline and a prednisone course.  Recommended on cough suppression regimen with Delsym and Tessalon Perles. Chest xray 01/01/21 was clear .  Patient says since last visit he is feeling Better.  He has decreased cough and congestion.  Patient says he still has some lingering dry cough that is helped quite a bit with Delsym and Tessalon.  He denies any increased albuterol use.  He remains on Symbicort twice daily. He denies any chest pain orthopnea PND or hemoptysis.  Patient has underlying obstructive sleep apnea.  Patient says he tries to  wear his CPAP every night gets in about 6 to 7 hours.  CPAP download shows 87% compliance.  Daily average usage at 7.5 hours.  Patient is on auto CPAP 5 to 15 cm H2O.  AHI 7.1/hour.  Daily average pressure at 13 cm/H2O.  He has underlying end-stage renal disease on dialysis Tuesday, Thursday and Saturday.  Patient says he is doing well with this.  He is on the transplant  list at [facility].  Patient is followed by infectious disease with underlying HIV.  Patient has insulin-dependent diabetes.    04/11/2021- Interim hx  Patient presents today for acute visit d/t cough. He was doing well until recently. Cough returned a couple of weeks ago. He has some associated chest congestion and wheezing.  CXR with PCP on 04/07/21 showed small effusion bilaterally left greater than right without focal infiltrate. He was treated for possible CAP,  he was given rocephin and omnicef on Friday. His cough initially did improve but today he feels more drainage. He has been taking Symbicort, delsym, tessalon perles, albuterol nebulizer 3-4 times a day for cough. He is compliant with CPAP. He is getting HD Tuesday, Thursday or Saturday. He has chronic leg swelling. They have been taking off less fluid during dialysis per his wife. Ordered for echocardiogram but has not been scheduled.   Allergies  Allergen Reactions   Sulfa Antibiotics Other (See Comments)    High potassium   Ramipril Cough   Versed [Midazolam] Other (See Comments)    "I don't wake up very good or clear it out of my system"    Immunization History  Administered Date(s) Administered   Hepatitis A 12/23/2013, 09/05/2014, 11/18/2019   Hepatitis A, Adult 12/23/2013, 09/05/2014   Hepatitis A, Ped/Adol-2 Dose 12/23/2013, 09/05/2014, 11/18/2019   Hepatitis B, adult 12/23/2013, 02/01/2014, 09/05/2014   Hepatitis B, ped/adol 12/23/2013, 02/01/2014, 09/05/2014   Influenza Split 11/05/2011   Influenza,inj,Quad PF,6+ Mos 11/15/2013, 11/28/2014, 11/10/2015, 11/13/2016, 10/27/2017, 10/07/2018, 11/26/2019, 11/15/2020   Influenza,inj,Quad PF,6-35 Mos 11/26/2019   Influenza-Unspecified 11/12/2012   Meningococcal Conjugate 03/26/2016, 10/27/2017   Meningococcal Mcv4o 03/26/2016, 10/27/2017   Moderna Covid-19 Vaccine Bivalent Booster 61yr & up 12/08/2020   Moderna Sars-Covid-2 Vaccination 03/23/2019, 04/20/2019, 10/19/2019    Pneumococcal Conjugate-13 10/27/2017   Pneumococcal Polysaccharide-23 06/18/2012, 11/24/2018   Tdap 11/27/2010, 11/18/2019   Zoster Recombinat (Shingrix) 10/08/2019, 02/09/2020    Past Medical History:  Diagnosis Date   Anemia, iron deficiency On procrit   CAP (community acquired pneumonia)    CKD (chronic kidney disease) stage 3, GFR 30-59 ml/min (HCC)    Degenerative arthritis    Depression    Dyslipidemia    Gastroesophageal reflux disease    Gastroparesis diabeticorum ([facility]    Hematuria, microscopic 10/09   work up negative (Dr. EAmalia Hailey   HIV positive ([facility]    Hyperkalemia, diminished renal excretion 06/2011 secondary to TMP/SMZ; prior secondary to  ARBS;    Known potassium excretory defect; history of recurrent hyperkalemia due to diabetic renal disease; ACE/ARB contraindicated; hyperkalemia 06/2011 secondary to TMP-SMZ   Hypothyroidism    IDDM (insulin dependent diabetes mellitus)    38 years   Low HDL (under 40)    Proteinuria    Retinopathy    x2   SIRS (systemic inflammatory response syndrome) ([facility]     Tobacco History: Social History   Tobacco Use  Smoking Status Never  Smokeless Tobacco Never   Counseling given: Not Answered   Outpatient Medications Prior to Visit  Medication Sig Dispense Refill  acetaminophen (TYLENOL) 500 MG tablet Take 1,000 mg by mouth 2 (two) times daily.      acyclovir (ZOVIRAX) 400 MG tablet Takes mondays, wednesdays and fridays     AgaMatrix Ultra-Thin Lancets MISC TEST BS 4 TIMES A DAY AND AS NEEDED DX E10.65 400 each 3   amLODipine (NORVASC) 10 MG tablet Take 5 mg by mouth at bedtime as needed.     aspirin 81 MG chewable tablet Chew 81 mg by mouth every morning.     benzonatate (TESSALON) 200 MG capsule Take 1 capsule (200 mg total) by mouth 3 (three) times daily as needed for cough. 30 capsule 2   cefdinir (OMNICEF) 300 MG capsule Take 1 capsule (300 mg total) by mouth 2 (two) times daily. 1 po BID 20 capsule 0    cyclobenzaprine (FLEXERIL) 10 MG tablet TAKE 1 TABLET BY MOUTH THREE TIMES A DAY AS NEEDED FOR MUSCLE SPASMS 30 tablet 3   diclofenac sodium (VOLTAREN) 1 % GEL Apply 2 g topically 4 (four) times daily. 350 g 3   diphenoxylate-atropine (LOMOTIL) 2.5-0.025 MG tablet TAKE 1 TABLET BY MOUTH 4 (FOUR) TIMES DAILY AS NEEDED FOR DIARRHEA OR LOOSE STOOLS. 60 tablet 1   DOVATO 50-300 MG tablet TAKE 1 TABLET BY MOUTH DAILY 30 tablet 3   DULoxetine (CYMBALTA) 30 MG capsule Take 1 capsule (30 mg total) by mouth daily. TAKE WITH THE '60MG'$  FOR TOTAL OF '90MG'$  90 capsule 3   DULoxetine (CYMBALTA) 60 MG capsule Take 1 capsule (60 mg total) by mouth daily. Take with 30 mg fot a total of '90mg'$  90 capsule 3   esomeprazole (NEXIUM) 40 MG capsule Take 1 capsule (40 mg total) by mouth daily. 90 capsule 3   febuxostat (ULORIC) 40 MG tablet Take 1 tablet (40 mg total) by mouth daily. 90 tablet 3   ferrous sulfate 325 (65 FE) MG tablet Take 650 mg by mouth daily with breakfast.      fluticasone (FLONASE) 50 MCG/ACT nasal spray SPRAY 2 SPRAYS INTO EACH NOSTRIL EVERY DAY 48 mL 1   furosemide (LASIX) 40 MG tablet Take 40 mg by mouth daily. 40 mg nightly  4   Glucosamine-Chondroit-Vit C-Mn (GLUCOSAMINE 1500 COMPLEX PO) Take 1 tablet by mouth 2 (two) times daily.      glucosamine-chondroitin 500-400 MG tablet Take by mouth.      glucose blood (ONETOUCH VERIO) test strip TEST BLOOD SUGAR 4 TIMES DAILY AND AS NEEDED 300 each 4   hydrALAZINE (APRESOLINE) 100 MG tablet Take by mouth.     HYDROcodone bit-homatropine (HYCODAN) 5-1.5 MG/5ML syrup Take 5 mLs by mouth every 6 (six) hours as needed for cough. 240 mL 0   hyoscyamine (LEVSIN) 0.125 MG tablet TAKE 1 TABLET (0.125 MG TOTAL) BY MOUTH EVERY 4 (FOUR) HOURS AS NEEDED. 60 tablet 2   icosapent Ethyl (VASCEPA) 1 g capsule Take 2 capsules (2 g total) by mouth 2 (two) times daily. 120 capsule 3   insulin glargine (LANTUS) 100 UNIT/ML injection In the event of insulin pump failure, inject  8 units twice daily     insulin lispro (HUMALOG) 100 UNIT/ML injection USE 42 UNITS TO 120 UNITS PER PUMP DAILY AS DIRECTED 90 mL 3   ipratropium (ATROVENT) 0.03 % nasal spray USE 2 SPRAYS IN EACH NOSTRIL 2-3 TIMES DAILY 30 mL 1   levocetirizine (XYZAL) 5 MG tablet Take 1 tablet (5 mg total) by mouth every evening. 90 tablet 3   levothyroxine (SYNTHROID) 200 MCG tablet Take 1  tablet (200 mcg total) by mouth daily. 90 tablet 3   levothyroxine (SYNTHROID) 25 MCG tablet Take 0.5 tablets (12.5 mcg total) by mouth daily. Take with the 200 mcg tab. 45 tablet 1   lipase/protease/amylase (CREON) 12000-38000 units CPEP capsule Take 2 capsules prior to meals and 1 capsule prior to snacks     LORazepam (ATIVAN) 0.5 MG tablet Take by mouth.     losartan (COZAAR) 100 MG tablet Take 100 mg by mouth daily.     meclizine (ANTIVERT) 12.5 MG tablet Take 1 tablet (12.5 mg total) by mouth 3 (three) times daily as needed for dizziness. 30 tablet 3   metoCLOPramide (REGLAN) 10 MG tablet Take 10 mg by mouth 4 (four) times daily.     metoprolol succinate (TOPROL-XL) 50 MG 24 hr tablet Take 50 mg by mouth 2 (two) times daily.     mirtazapine (REMERON) 15 MG tablet Take 1 tablet (15 mg total) by mouth at bedtime. 30 tablet 2   niacin (NIASPAN) 1000 MG CR tablet TAKE 1 TABLET (1,000 MG TOTAL) BY MOUTH AT BEDTIME. 30 tablet 5   ondansetron (ZOFRAN) 4 MG tablet TAKE 1 TABLET BY MOUTH EVERY 8 HOURS AS NEEDED FOR NAUSEA 45 tablet 6   Probiotic Product (MISC INTESTINAL FLORA REGULAT) CAPS Take 1 capsule by mouth every morning.     promethazine (PHENERGAN) 25 MG suppository PLACE 1 SUPPOSITORY (25 MG TOTAL) RECTALLY EVERY 6 (SIX) HOURS AS NEEDED FOR NAUSEA OR VOMITING. 30 suppository 1   rosuvastatin (CRESTOR) 20 MG tablet Take 1 tablet (20 mg total) by mouth at bedtime. 90 tablet 3   sevelamer carbonate (RENVELA) 800 MG tablet Take 800 mg by mouth 3 (three) times daily. 2 tabs before each meal     tamsulosin (FLOMAX) 0.4 MG CAPS  capsule Take 0.4 mg by mouth daily.     Testosterone 20.25 MG/ACT (1.62%) GEL APPLY 3 PUMPS DAILY AS DIRECTED 75 g 1   traZODone (DESYREL) 50 MG tablet Take by mouth.     albuterol (PROVENTIL) (2.5 MG/3ML) 0.083% nebulizer solution Take 3 mLs (2.5 mg total) by nebulization every 6 (six) hours as needed for wheezing or shortness of breath. 75 mL 12   albuterol (VENTOLIN HFA) 108 (90 Base) MCG/ACT inhaler Inhale 2 puffs into the lungs every 6 (six) hours as needed for wheezing or shortness of breath. 8 g 5   budesonide-formoterol (SYMBICORT) 160-4.5 MCG/ACT inhaler Inhale 2 puffs into the lungs in the morning and at bedtime. 1 each 6   montelukast (SINGULAIR) 10 MG tablet Take 1 tablet (10 mg total) by mouth at bedtime. 30 tablet 6   No facility-administered medications prior to visit.   Review of Systems  Review of Systems  Constitutional:  Negative for unexpected weight change.  HENT: Negative.    Respiratory:  Positive for cough.   Cardiovascular:  Positive for leg swelling.    Physical Exam  BP (!) 160/90    Pulse 61    Temp 98.6 F (37 C) (Oral)    Ht '5\' 10"'$  (1.778 m)    Wt 218 lb (98.9 kg)    SpO2 95%    BMI 31.28 kg/m  Physical Exam Constitutional:      Appearance: Normal appearance.  HENT:     Head: Normocephalic and atraumatic.  Cardiovascular:     Rate and Rhythm: Normal rate and regular rhythm.  Pulmonary:     Breath sounds: No wheezing or rales.     Comments: Decreased lung  sounds, possible rhonchi left base Neurological:     General: No focal deficit present.     Mental Status: He is alert and oriented to person, place, and time. Mental status is at baseline.  Psychiatric:        Mood and Affect: Mood normal.        Behavior: Behavior normal.        Thought Content: Thought content normal.        Judgment: Judgment normal.     Lab Results:  CBC    Component Value Date/Time   WBC 6.5 02/09/2020 0825   WBC 13.4 (H) 05/14/2016 0521   RBC 2.42 (LL) 02/09/2020  0825   RBC 2.94 (L) 05/14/2016 0521   HGB 8.2 (LL) 02/09/2020 0825   HGB 12.7 (L) 07/16/2005 0816   HCT 24.6 (L) 02/09/2020 0825   HCT 37.0 (L) 07/16/2005 0816   PLT 193 02/09/2020 0825   MCV 102 (H) 02/09/2020 0825   MCV 80.0 (L) 07/16/2005 0816   MCH 33.9 (H) 02/09/2020 0825   MCH 32.7 05/14/2016 0521   MCHC 33.3 02/09/2020 0825   MCHC 34.4 05/14/2016 0521   RDW 15.6 (H) 02/09/2020 0825   RDW 13.8 07/16/2005 0816   LYMPHSABS 1.9 02/09/2020 0825   LYMPHSABS 3.2 07/16/2005 0816   MONOABS 1.3 (H) 05/10/2016 0746   MONOABS 1.0 (H) 07/16/2005 0816   EOSABS 0.4 02/09/2020 0825   BASOSABS 0.1 02/09/2020 0825   BASOSABS 0.0 07/16/2005 0816    BMET    Component Value Date/Time   NA 137 04/11/2021 1147   NA 134 08/14/2020 0900   K 3.7 04/11/2021 1147   CL 97 04/11/2021 1147   CO2 29 04/11/2021 1147   GLUCOSE 93 04/11/2021 1147   BUN 27 (H) 04/11/2021 1147   BUN 40 (H) 08/14/2020 0900   CREATININE 7.04 (HH) 04/11/2021 1147   CREATININE 2.40 (H) 09/05/2014 1637   CALCIUM 9.5 04/11/2021 1147   CALCIUM 10.1 08/10/2012 1143   GFRNONAA 15 (L) 02/09/2020 0825   GFRNONAA 31 (L) 09/05/2014 1637   GFRAA 17 (L) 02/09/2020 0825   GFRAA 36 (L) 09/05/2014 1637    BNP    Component Value Date/Time   BNP 1,422.3 (H) 04/06/2021 1414    ProBNP    Component Value Date/Time   PROBNP 2,360.0 (H) 04/11/2021 1147    Imaging: DG Chest 2 View  Result Date: 04/07/2021 CLINICAL DATA:  Chronic cough EXAM: CHEST - 2 VIEW COMPARISON:  01/01/2021 FINDINGS: Cardiac shadow is stable. Lungs are well aerated bilaterally. Small effusions are noted left greater than right. No focal infiltrate is seen. No acute bony abnormality is noted. Impression deformities are noted in midthoracic spine stable in appearance. IMPRESSION: Small effusions bilaterally left greater than right without focal infiltrate. Electronically Signed   By: Inez Catalina M.D.   On: 04/07/2021 21:43     Assessment & Plan:   CAP  (community acquired pneumonia) - Increased cough with associated chest congestion and wheezing. CXR on 04/07/21 with PCP showed  Small effusion bilaterally left greater than right without focal infiltrate. Treated for suspected CAP with omnicef x 10 days. His cough initially improved but today he feels more drainage. We will repeat imaging today and check labs.   ESRD (end stage renal disease) on dialysis [facility]) - On hemodialysis Tuesday, Thursday and Saturday. Critical lab result today showed creatinine 7.04 (CMET on 04/07/21 revealed Creatinine 9.70). Receiving dialysis tomorrow   Elevated brain natriuretic peptide (BNP) level -  Concern HF, systolic function was normal in 2014. PCP ordered for echocardiogram / needs to be scheduled. Referring to cardiology    Martyn Ehrich, NP 04/11/2021

## 2021-04-11 NOTE — Telephone Encounter (Signed)
Received critical lab result on patient - Cr 7.04 and GFR 8.33. Patient is on HD and receiving tomorrow. Labs reviewed, CMET on 04/07/21 revealed Creatinine 9.70. No further intervention at this time needed. ?

## 2021-04-11 NOTE — Assessment & Plan Note (Signed)
-   Increased cough with associated chest congestion and wheezing. CXR on 04/07/21 with PCP showed  Small effusion bilaterally left greater than right without focal infiltrate. Treated for suspected CAP with omnicef x 10 days. His cough initially improved but today he feels more drainage. We will repeat imaging today and check labs.  ?

## 2021-04-11 NOTE — Assessment & Plan Note (Signed)
-   On hemodialysis Tuesday, Thursday and Saturday. Critical lab result today showed creatinine 7.04 (CMET on 04/07/21 revealed Creatinine 9.70). Receiving dialysis tomorrow  ?

## 2021-04-11 NOTE — Progress Notes (Signed)
Please let patient know creatinine was 7.04. On HD. BNP elevated, I referred to cardiology HF clinic as urgent. Awaiting echo to be scheduled

## 2021-04-12 ENCOUNTER — Other Ambulatory Visit: Payer: Self-pay | Admitting: Family Medicine

## 2021-04-12 ENCOUNTER — Telehealth: Payer: Self-pay | Admitting: Primary Care

## 2021-04-12 DIAGNOSIS — E8779 Other fluid overload: Secondary | ICD-10-CM | POA: Diagnosis not present

## 2021-04-12 DIAGNOSIS — D631 Anemia in chronic kidney disease: Secondary | ICD-10-CM | POA: Diagnosis not present

## 2021-04-12 DIAGNOSIS — N186 End stage renal disease: Secondary | ICD-10-CM | POA: Diagnosis not present

## 2021-04-12 DIAGNOSIS — D509 Iron deficiency anemia, unspecified: Secondary | ICD-10-CM | POA: Diagnosis not present

## 2021-04-12 DIAGNOSIS — N2581 Secondary hyperparathyroidism of renal origin: Secondary | ICD-10-CM | POA: Diagnosis not present

## 2021-04-12 NOTE — Telephone Encounter (Signed)
Attempted to call. Left voicemail for pt to return call for results  ?

## 2021-04-13 ENCOUNTER — Other Ambulatory Visit: Payer: Self-pay | Admitting: Pulmonary Disease

## 2021-04-13 ENCOUNTER — Telehealth (HOSPITAL_COMMUNITY): Payer: Self-pay | Admitting: Cardiology

## 2021-04-13 ENCOUNTER — Telehealth: Payer: Self-pay | Admitting: Family Medicine

## 2021-04-13 NOTE — Telephone Encounter (Signed)
Pt called to f/u w/referral, pt stated it is urgent ?

## 2021-04-13 NOTE — Telephone Encounter (Signed)
Noted  

## 2021-04-13 NOTE — Telephone Encounter (Signed)
Called and spoke with patient's mother [name]. She stated that she was concerned about the results that appeared in MyChart because it looks like the CXR from last week and this week have gotten worse. She had missed a call from [facility] yesterday.  ? ?I went over the lab results and she verbalized understanding. She is aware that an urgent referral has been sent to the heart failure clinic and someone from that office will be in contact with her.  ? ?She also wanted to know about the CXR results. I looked at them and it does not look Beth reviewed before she left.  ? ?Dr. Halford Chessman, can you look at his CXR results from 04/11/21? Thanks!  ?

## 2021-04-13 NOTE — Telephone Encounter (Signed)
Called and spoke patient's mother. She verbalized understanding of Dr. Juanetta Gosling results. I provided her with the number to the heart failure clinic since the referral has already been placed.  ? ?Also advised her that I would a copy of the lab and CXR reports to his nephrologist Dr. Antionette Fairy. She will follow up with their office.  ? ?Nothing further needed at time of call.  ?

## 2021-04-13 NOTE — Telephone Encounter (Signed)
Patrick Brown calling again. She is demanding to speak with an Glass blower/designer as it has been two days since results were delivered via MyChart and she is angry that we havent delivered them ourselves. [name] stated she would call back as soon as she was off the phone. Routing as urgent to try and mediate a problem ?

## 2021-04-13 NOTE — Telephone Encounter (Signed)
Please let her know the chest xray shows changes consistent with congestive heart failure.  This is consistent with findings on his lab tests.  He will need to follow up with cardiology to have this assessed further.  He should also discuss with his nephrologist at [facility] about whether he needs to have more fluid removed with dialysis. ?

## 2021-04-14 ENCOUNTER — Other Ambulatory Visit: Payer: Self-pay | Admitting: Family Medicine

## 2021-04-14 DIAGNOSIS — N186 End stage renal disease: Secondary | ICD-10-CM | POA: Diagnosis not present

## 2021-04-14 DIAGNOSIS — D509 Iron deficiency anemia, unspecified: Secondary | ICD-10-CM | POA: Diagnosis not present

## 2021-04-14 DIAGNOSIS — E8779 Other fluid overload: Secondary | ICD-10-CM | POA: Diagnosis not present

## 2021-04-14 DIAGNOSIS — N2581 Secondary hyperparathyroidism of renal origin: Secondary | ICD-10-CM | POA: Diagnosis not present

## 2021-04-14 DIAGNOSIS — D631 Anemia in chronic kidney disease: Secondary | ICD-10-CM | POA: Diagnosis not present

## 2021-04-14 NOTE — Telephone Encounter (Signed)
Last office visit 02/21/21 ?Medication on med list but not prescribed by our office ?

## 2021-04-16 ENCOUNTER — Ambulatory Visit: Payer: Medicare Other | Admitting: Family Medicine

## 2021-04-16 ENCOUNTER — Encounter: Payer: Self-pay | Admitting: Family Medicine

## 2021-04-16 DIAGNOSIS — K6389 Other specified diseases of intestine: Secondary | ICD-10-CM | POA: Diagnosis not present

## 2021-04-16 DIAGNOSIS — K2970 Gastritis, unspecified, without bleeding: Secondary | ICD-10-CM | POA: Diagnosis not present

## 2021-04-16 DIAGNOSIS — E119 Type 2 diabetes mellitus without complications: Secondary | ICD-10-CM | POA: Diagnosis not present

## 2021-04-16 DIAGNOSIS — R053 Chronic cough: Secondary | ICD-10-CM

## 2021-04-16 DIAGNOSIS — I10 Essential (primary) hypertension: Secondary | ICD-10-CM | POA: Diagnosis not present

## 2021-04-16 DIAGNOSIS — K219 Gastro-esophageal reflux disease without esophagitis: Secondary | ICD-10-CM | POA: Diagnosis not present

## 2021-04-16 DIAGNOSIS — E785 Hyperlipidemia, unspecified: Secondary | ICD-10-CM | POA: Diagnosis not present

## 2021-04-16 DIAGNOSIS — K6289 Other specified diseases of anus and rectum: Secondary | ICD-10-CM | POA: Diagnosis not present

## 2021-04-16 DIAGNOSIS — E039 Hypothyroidism, unspecified: Secondary | ICD-10-CM | POA: Diagnosis not present

## 2021-04-16 DIAGNOSIS — K8689 Other specified diseases of pancreas: Secondary | ICD-10-CM | POA: Diagnosis not present

## 2021-04-16 DIAGNOSIS — N261 Atrophy of kidney (terminal): Secondary | ICD-10-CM | POA: Diagnosis not present

## 2021-04-16 DIAGNOSIS — R1013 Epigastric pain: Secondary | ICD-10-CM | POA: Diagnosis not present

## 2021-04-16 MED ORDER — CEFDINIR 300 MG PO CAPS: 300.0000 mg | ORAL_CAPSULE | Freq: Two times a day (BID) | ORAL | 0 refills | Status: DC

## 2021-04-16 NOTE — Telephone Encounter (Signed)
Appt sch

## 2021-04-17 DIAGNOSIS — N186 End stage renal disease: Secondary | ICD-10-CM | POA: Diagnosis not present

## 2021-04-17 DIAGNOSIS — N2581 Secondary hyperparathyroidism of renal origin: Secondary | ICD-10-CM | POA: Diagnosis not present

## 2021-04-17 DIAGNOSIS — E8779 Other fluid overload: Secondary | ICD-10-CM | POA: Diagnosis not present

## 2021-04-17 DIAGNOSIS — D509 Iron deficiency anemia, unspecified: Secondary | ICD-10-CM | POA: Diagnosis not present

## 2021-04-17 DIAGNOSIS — D631 Anemia in chronic kidney disease: Secondary | ICD-10-CM | POA: Diagnosis not present

## 2021-04-19 DIAGNOSIS — D631 Anemia in chronic kidney disease: Secondary | ICD-10-CM | POA: Diagnosis not present

## 2021-04-19 DIAGNOSIS — D509 Iron deficiency anemia, unspecified: Secondary | ICD-10-CM | POA: Diagnosis not present

## 2021-04-19 DIAGNOSIS — N186 End stage renal disease: Secondary | ICD-10-CM | POA: Diagnosis not present

## 2021-04-19 DIAGNOSIS — N2581 Secondary hyperparathyroidism of renal origin: Secondary | ICD-10-CM | POA: Diagnosis not present

## 2021-04-19 DIAGNOSIS — E8779 Other fluid overload: Secondary | ICD-10-CM | POA: Diagnosis not present

## 2021-04-20 ENCOUNTER — Telehealth: Payer: Self-pay | Admitting: Family Medicine

## 2021-04-20 NOTE — Telephone Encounter (Signed)
Pt call will, appt for dettingeer on 04/23/21 at 255 ?

## 2021-04-21 DIAGNOSIS — N186 End stage renal disease: Secondary | ICD-10-CM | POA: Diagnosis not present

## 2021-04-21 DIAGNOSIS — D631 Anemia in chronic kidney disease: Secondary | ICD-10-CM | POA: Diagnosis not present

## 2021-04-21 DIAGNOSIS — E8779 Other fluid overload: Secondary | ICD-10-CM | POA: Diagnosis not present

## 2021-04-21 DIAGNOSIS — D509 Iron deficiency anemia, unspecified: Secondary | ICD-10-CM | POA: Diagnosis not present

## 2021-04-21 DIAGNOSIS — N2581 Secondary hyperparathyroidism of renal origin: Secondary | ICD-10-CM | POA: Diagnosis not present

## 2021-04-23 ENCOUNTER — Ambulatory Visit (INDEPENDENT_AMBULATORY_CARE_PROVIDER_SITE_OTHER): Payer: Medicare Other | Admitting: Family Medicine

## 2021-04-23 ENCOUNTER — Encounter: Payer: Self-pay | Admitting: Family Medicine

## 2021-04-23 ENCOUNTER — Inpatient Hospital Stay (HOSPITAL_COMMUNITY): Admission: RE | Admit: 2021-04-23 | Payer: Medicare Other | Source: Ambulatory Visit

## 2021-04-23 VITALS — BP 179/82 | HR 57 | Ht 70.0 in | Wt 213.0 lb

## 2021-04-23 DIAGNOSIS — K2900 Acute gastritis without bleeding: Secondary | ICD-10-CM

## 2021-04-23 MED ORDER — SUCRALFATE 1 G PO TABS: 1.0000 g | ORAL_TABLET | Freq: Two times a day (BID) | ORAL | 1 refills | Status: AC

## 2021-04-23 NOTE — Progress Notes (Signed)
? ?BP (!) 179/82   Pulse (!) 57   Ht '5\' 10"'$  (1.778 m)   Wt 213 lb (96.6 kg)   SpO2 98%   BMI 30.56 kg/m?   ? ?Subjective:  ? ?Patient ID: Patrick Brown, male    DOB: [birth year], 51 y.o.   MRN: [id number] ? ?HPI: ?Patrick Brown is a 51 y.o. male presenting on 04/23/2021 for ER follow up ? ? ?HPI ?Patient was in the ER 04/16/2021 for epigastric discomfort and gastritis that has been keeping him up for the past couple weeks that night he would get this irritation every night.  He said when he went to the ER they had a CAT scan and x-rays and evaluation and did not find any abnormalities and then gave him Carafate and he says has been feeling a lot better with Carafate and is not having any stomach issues.  He does still do dialysis regularly and blood work through them regularly. ? ?Relevant past medical, surgical, family and social history reviewed and updated as indicated. Interim medical history since our last visit reviewed. ?Allergies and medications reviewed and updated. ? ?Review of Systems  ?Constitutional:  Negative for chills and fever.  ?Respiratory:  Positive for shortness of breath (He is still short of breath and still going for evaluation for echocardiogram and has an appointment with CHF clinic). Negative for wheezing.   ?Cardiovascular:  Negative for chest pain and leg swelling.  ?Musculoskeletal:  Negative for back pain and gait problem.  ?Skin:  Negative for rash.  ?All other systems reviewed and are negative. ? ?Per HPI unless specifically indicated above ? ? ? ? ? ? ?Objective:  ? ?BP (!) 179/82   Pulse (!) 57   Ht '5\' 10"'$  (1.778 m)   Wt 213 lb (96.6 kg)   SpO2 98%   BMI 30.56 kg/m?   ?Wt Readings from Last 3 Encounters:  ?04/23/21 213 lb (96.6 kg)  ?04/11/21 218 lb (98.9 kg)  ?04/06/21 218 lb (98.9 kg)  ?  ?Physical Exam ?Vitals and nursing note reviewed.  ?Constitutional:   ?   General: He is not in acute distress. ?   Appearance: He is well-developed. He is not diaphoretic.  ?Eyes:  ?    General: No scleral icterus. ?   Conjunctiva/sclera: Conjunctivae normal.  ?Neck:  ?   Thyroid: No thyromegaly.  ?Cardiovascular:  ?   Rate and Rhythm: Normal rate and regular rhythm.  ?   Heart sounds: Normal heart sounds. No murmur heard. ?Pulmonary:  ?   Effort: Pulmonary effort is normal. No respiratory distress.  ?   Breath sounds: Rales (Right lower bibasilar rales) present. No wheezing.  ?Abdominal:  ?   General: Abdomen is flat. Bowel sounds are normal. There is no distension.  ?   Tenderness: There is no abdominal tenderness. There is no guarding or rebound.  ?Musculoskeletal:     ?   General: Swelling (Trace edema in right arm) present.  ?   Cervical back: Neck supple.  ?Lymphadenopathy:  ?   Cervical: No cervical adenopathy.  ?Skin: ?   General: Skin is warm and dry.  ?   Findings: No rash.  ?Neurological:  ?   Mental Status: He is alert and oriented to person, place, and time.  ?   Coordination: Coordination normal.  ?Psychiatric:     ?   Behavior: Behavior normal.  ? ? ? ? ?Assessment & Plan:  ? ?Problem List Items Addressed This Visit   ?None ?  Visit Diagnoses   ? ? Other acute gastritis without hemorrhage    -  Primary  ? Relevant Medications  ? sucralfate (CARAFATE) 1 g tablet  ? ?  ?  ?Will continue with Carafate, he does have an appointment with GI.  He says he is feeling a lot better. ?Follow up plan: ?Return if symptoms worsen or fail to improve. ? ?Counseling provided for all of the vaccine components ?No orders of the defined types were placed in this encounter. ? ? ?Caryl Pina, MD ?[place] ?04/23/2021, 3:33 PM ? ? ? ? ?

## 2021-04-24 DIAGNOSIS — D631 Anemia in chronic kidney disease: Secondary | ICD-10-CM | POA: Diagnosis not present

## 2021-04-24 DIAGNOSIS — E8779 Other fluid overload: Secondary | ICD-10-CM | POA: Diagnosis not present

## 2021-04-24 DIAGNOSIS — D509 Iron deficiency anemia, unspecified: Secondary | ICD-10-CM | POA: Diagnosis not present

## 2021-04-24 DIAGNOSIS — N186 End stage renal disease: Secondary | ICD-10-CM | POA: Diagnosis not present

## 2021-04-24 DIAGNOSIS — N2581 Secondary hyperparathyroidism of renal origin: Secondary | ICD-10-CM | POA: Diagnosis not present

## 2021-04-26 DIAGNOSIS — E8779 Other fluid overload: Secondary | ICD-10-CM | POA: Diagnosis not present

## 2021-04-26 DIAGNOSIS — D631 Anemia in chronic kidney disease: Secondary | ICD-10-CM | POA: Diagnosis not present

## 2021-04-26 DIAGNOSIS — N186 End stage renal disease: Secondary | ICD-10-CM | POA: Diagnosis not present

## 2021-04-26 DIAGNOSIS — N2581 Secondary hyperparathyroidism of renal origin: Secondary | ICD-10-CM | POA: Diagnosis not present

## 2021-04-26 DIAGNOSIS — D509 Iron deficiency anemia, unspecified: Secondary | ICD-10-CM | POA: Diagnosis not present

## 2021-04-26 NOTE — Patient Instructions (Signed)
Patrick Brown , ?Thank you for taking time to come for your Medicare Wellness Visit. I appreciate your ongoing commitment to your health goals. Please review the following plan we discussed and let me know if I can assist you in the future.  ? ?Screening recommendations/referrals: ?Colonoscopy: Done 03/27/2020 - Repeat in 10 years ?Recommended yearly ophthalmology/optometry visit for glaucoma screening and checkup ?Recommended yearly dental visit for hygiene and checkup ? ?Vaccinations: ?Influenza vaccine: Done 11/15/2020 - Repeat annually  ?Pneumococcal vaccine: Done  06/18/2012, 10/27/2017 & 11/24/2018   ?Tdap vaccine: Done 11/18/2019 - Repeat in 10 years ?Shingles vaccine: Done 10/08/2019 & 02/09/2020   ?Covid-19: Done 03/23/2019, 04/20/2019, 10/19/2019, & 12/08/2020 ? ?Advanced directives: Advance directive discussed with you today. Even though you declined this today, please call our office should you change your mind, and we can give you the proper paperwork for you to fill out.  ? ?Conditions/risks identified: Aim for 30 minutes of exercise or brisk walking, 6-8 glasses of water, and 5 servings of fruits and vegetables each day.  ? ?Next appointment: Follow up in one year for your annual wellness visit  ? ?Preventive Care 40-64 Years, Male ?Preventive care refers to lifestyle choices and visits with your health care provider that can promote health and wellness. ?What does preventive care include? ?A yearly physical exam. This is also called an annual well check. ?Dental exams once or twice a year. ?Routine eye exams. Ask your health care provider how often you should have your eyes checked. ?Personal lifestyle choices, including: ?Daily care of your teeth and gums. ?Regular physical activity. ?Eating a healthy diet. ?Avoiding tobacco and drug use. ?Limiting alcohol use. ?Practicing safe sex. ?Taking low-dose aspirin every day starting at age 13. ?What happens during an annual well check? ?The services and screenings done  by your health care provider during your annual well check will depend on your age, overall health, lifestyle risk factors, and family history of disease. ?Counseling  ?Your health care provider may ask you questions about your: ?Alcohol use. ?Tobacco use. ?Drug use. ?Emotional well-being. ?Home and relationship well-being. ?Sexual activity. ?Eating habits. ?Work and work Statistician. ?Screening  ?You may have the following tests or measurements: ?Height, weight, and BMI. ?Blood pressure. ?Lipid and cholesterol levels. These may be checked every 5 years, or more frequently if you are over 40 years old. ?Skin check. ?Lung cancer screening. You may have this screening every year starting at age 69 if you have a 30-pack-year history of smoking and currently smoke or have quit within the past 15 years. ?Fecal occult blood test (FOBT) of the stool. You may have this test every year starting at age 34. ?Flexible sigmoidoscopy or colonoscopy. You may have a sigmoidoscopy every 5 years or a colonoscopy every 10 years starting at age 4. ?Prostate cancer screening. Recommendations will vary depending on your family history and other risks. ?Hepatitis C blood test. ?Hepatitis B blood test. ?Sexually transmitted disease (STD) testing. ?Diabetes screening. This is done by checking your blood sugar (glucose) after you have not eaten for a while (fasting). You may have this done every 1-3 years. ?Discuss your test results, treatment options, and if necessary, the need for more tests with your health care provider. ?Vaccines  ?Your health care provider may recommend certain vaccines, such as: ?Influenza vaccine. This is recommended every year. ?Tetanus, diphtheria, and acellular pertussis (Tdap, Td) vaccine. You may need a Td booster every 10 years. ?Zoster vaccine. You may need this after age 57. ?Pneumococcal  13-valent conjugate (PCV13) vaccine. You may need this if you have certain conditions and have not been  vaccinated. ?Pneumococcal polysaccharide (PPSV23) vaccine. You may need one or two doses if you smoke cigarettes or if you have certain conditions. ?Talk to your health care provider about which screenings and vaccines you need and how often you need them. ?This information is not intended to replace advice given to you by your health care provider. Make sure you discuss any questions you have with your health care provider. ?Document Released: 02/17/2015 Document Revised: 10/11/2015 Document Reviewed: 11/22/2014 ?Elsevier Interactive Patient Education ? 2017 [place]. ? ?Fall Prevention in the Home ?Falls can cause injuries. They can happen to people of all ages. There are many things you can do to make your home safe and to help prevent falls. ?What can I do on the outside of my home? ?Regularly fix the edges of walkways and driveways and fix any cracks. ?Remove anything that might make you trip as you walk through a door, such as a raised step or threshold. ?Trim any bushes or trees on the path to your home. ?Use bright outdoor lighting. ?Clear any walking paths of anything that might make someone trip, such as rocks or tools. ?Regularly check to see if handrails are loose or broken. Make sure that both sides of any steps have handrails. ?Any raised decks and porches should have guardrails on the edges. ?Have any leaves, snow, or ice cleared regularly. ?Use sand or salt on walking paths during winter. ?Clean up any spills in your garage right away. This includes oil or grease spills. ?What can I do in the bathroom? ?Use night lights. ?Install grab bars by the toilet and in the tub and shower. Do not use towel bars as grab bars. ?Use non-skid mats or decals in the tub or shower. ?If you need to sit down in the shower, use a plastic, non-slip stool. ?Keep the floor dry. Clean up any water that spills on the floor as soon as it happens. ?Remove soap buildup in the tub or shower regularly. ?Attach bath mats  securely with double-sided non-slip rug tape. ?Do not have throw rugs and other things on the floor that can make you trip. ?What can I do in the bedroom? ?Use night lights. ?Make sure that you have a light by your bed that is easy to reach. ?Do not use any sheets or blankets that are too big for your bed. They should not hang down onto the floor. ?Have a firm chair that has side arms. You can use this for support while you get dressed. ?Do not have throw rugs and other things on the floor that can make you trip. ?What can I do in the kitchen? ?Clean up any spills right away. ?Avoid walking on wet floors. ?Keep items that you use a lot in easy-to-reach places. ?If you need to reach something above you, use a strong step stool that has a grab bar. ?Keep electrical cords out of the way. ?Do not use floor polish or wax that makes floors slippery. If you must use wax, use non-skid floor wax. ?Do not have throw rugs and other things on the floor that can make you trip. ?What can I do with my stairs? ?Do not leave any items on the stairs. ?Make sure that there are handrails on both sides of the stairs and use them. Fix handrails that are broken or loose. Make sure that handrails are as long as the stairways. ?  Check any carpeting to make sure that it is firmly attached to the stairs. Fix any carpet that is loose or worn. ?Avoid having throw rugs at the top or bottom of the stairs. If you do have throw rugs, attach them to the floor with carpet tape. ?Make sure that you have a light switch at the top of the stairs and the bottom of the stairs. If you do not have them, ask someone to add them for you. ?What else can I do to help prevent falls? ?Wear shoes that: ?Do not have high heels. ?Have rubber bottoms. ?Are comfortable and fit you well. ?Are closed at the toe. Do not wear sandals. ?If you use a stepladder: ?Make sure that it is fully opened. Do not climb a closed stepladder. ?Make sure that both sides of the stepladder  are locked into place. ?Ask someone to hold it for you, if possible. ?Clearly mark and make sure that you can see: ?Any grab bars or handrails. ?First and last steps. ?Where the edge of each step is. ?Use tools that help y

## 2021-04-27 ENCOUNTER — Ambulatory Visit (INDEPENDENT_AMBULATORY_CARE_PROVIDER_SITE_OTHER): Payer: Medicare Other

## 2021-04-27 VITALS — Wt 213.0 lb

## 2021-04-27 DIAGNOSIS — Z0000 Encounter for general adult medical examination without abnormal findings: Secondary | ICD-10-CM | POA: Diagnosis not present

## 2021-04-27 NOTE — Progress Notes (Signed)
? ?Subjective:  ? Patrick Brown is a 51 y.o. male who presents for Medicare Annual/Subsequent preventive examination. ? ?Virtual Visit via Telephone Note ? ?I connected with  Patrick Brown on 04/27/21 at 12:00 PM EDT by telephone and verified that I am speaking with the correct person using two identifiers. ? ?Location: ?Patient: Home ?Provider: [facility] ?Persons participating in the virtual visit: patient/Nurse Health Advisor ?  ?I discussed the limitations, risks, security and privacy concerns of performing an evaluation and management service by telephone and the availability of in person appointments. The patient expressed understanding and agreed to proceed. ? ?Interactive audio and video telecommunications were attempted between this nurse and patient, however failed, due to patient having technical difficulties OR patient did not have access to video capability.  We continued and completed visit with audio only. ? ?Some vital signs may be absent or patient reported.  ? ?Chrishun Scheer Dionne Ano, LPN  ? ?Review of Systems    ? ?Cardiac Risk Factors include: diabetes mellitus;obesity (BMI >30kg/m2);sedentary lifestyle;male gender, Risk factor comments: OSA on CPAP, ESRD on Dialysis, HIV, Anemia ? ?   ?Objective:  ?  ?Today's Vitals  ? 04/27/21 1159  ?Weight: 213 lb (96.6 kg)  ? ?Body mass index is 30.56 kg/m?. ? ? ?  04/27/2021  ? 12:14 PM 02/25/2020  ?  9:41 AM 06/10/2016  ?  6:25 PM 05/09/2016  ?  8:34 PM 06/08/2015  ?  2:14 PM 10/06/2014  ?  4:05 PM 09/26/2014  ? 10:07 PM  ?Advanced Directives  ?Does Patient Have a Medical Advance Directive? No No No No No No No  ?Would patient like information on creating a medical advance directive? No - Patient declined No - Patient declined       ? ? ?Current Medications (verified) ?Outpatient Encounter Medications as of 04/27/2021  ?Medication Sig  ? acetaminophen (TYLENOL) 500 MG tablet Take 1,000 mg by mouth 2 (two) times daily.   ? acyclovir (ZOVIRAX) 400 MG tablet Takes mondays, wednesdays and  fridays  ? AgaMatrix Ultra-Thin Lancets MISC TEST BS 4 TIMES A DAY AND AS NEEDED DX E10.65  ? albuterol (PROVENTIL) (2.5 MG/3ML) 0.083% nebulizer solution Take 3 mLs (2.5 mg total) by nebulization every 6 (six) hours as needed for wheezing or shortness of breath.  ? albuterol (VENTOLIN HFA) 108 (90 Base) MCG/ACT inhaler Inhale 2 puffs into the lungs every 6 (six) hours as needed for wheezing or shortness of breath.  ? amLODipine (NORVASC) 10 MG tablet Take 5 mg by mouth at bedtime as needed.  ? aspirin 81 MG chewable tablet Chew 81 mg by mouth every morning.  ? benzonatate (TESSALON) 200 MG capsule Take 1 capsule (200 mg total) by mouth 3 (three) times daily as needed for cough.  ? budesonide-formoterol (SYMBICORT) 160-4.5 MCG/ACT inhaler Inhale 2 puffs into the lungs in the morning and at bedtime.  ? cefdinir (OMNICEF) 300 MG capsule Take 1 capsule (300 mg total) by mouth 2 (two) times daily. 1 po BID  ? cyclobenzaprine (FLEXERIL) 10 MG tablet TAKE 1 TABLET BY MOUTH THREE TIMES A DAY AS NEEDED FOR MUSCLE SPASMS  ? diclofenac sodium (VOLTAREN) 1 % GEL Apply 2 g topically 4 (four) times daily.  ? diphenoxylate-atropine (LOMOTIL) 2.5-0.025 MG tablet TAKE 1 TABLET BY MOUTH 4 (FOUR) TIMES DAILY AS NEEDED FOR DIARRHEA OR LOOSE STOOLS.  ? DOVATO 50-300 MG tablet TAKE 1 TABLET BY MOUTH DAILY  ? doxazosin (CARDURA) 1 MG tablet Take 1 mg by mouth daily.  ?  DULoxetine (CYMBALTA) 30 MG capsule Take 1 capsule (30 mg total) by mouth daily. TAKE WITH THE '60MG'$  FOR TOTAL OF '90MG'$   ? DULoxetine (CYMBALTA) 60 MG capsule Take 1 capsule (60 mg total) by mouth daily. Take with 30 mg fot a total of '90mg'$   ? esomeprazole (NEXIUM) 40 MG capsule Take 1 capsule (40 mg total) by mouth daily.  ? febuxostat (ULORIC) 40 MG tablet Take 1 tablet (40 mg total) by mouth daily.  ? ferrous sulfate 325 (65 FE) MG tablet Take 650 mg by mouth daily with breakfast.   ? fluticasone (FLONASE) 50 MCG/ACT nasal spray SPRAY 2 SPRAYS INTO EACH NOSTRIL EVERY DAY   ? furosemide (LASIX) 40 MG tablet Take 40 mg by mouth daily. 40 mg nightly  ? Glucosamine-Chondroit-Vit C-Mn (GLUCOSAMINE 1500 COMPLEX PO) Take 1 tablet by mouth 2 (two) times daily.   ? glucosamine-chondroitin 500-400 MG tablet Take by mouth.   ? glucose blood (ONETOUCH VERIO) test strip TEST BLOOD SUGAR 4 TIMES DAILY AND AS NEEDED  ? hydrALAZINE (APRESOLINE) 100 MG tablet Take by mouth.  ? HYDROcodone bit-homatropine (HYCODAN) 5-1.5 MG/5ML syrup Take 5 mLs by mouth every 6 (six) hours as needed for cough.  ? hyoscyamine (LEVSIN) 0.125 MG tablet TAKE 1 TABLET (0.125 MG TOTAL) BY MOUTH EVERY 4 (FOUR) HOURS AS NEEDED.  ? icosapent Ethyl (VASCEPA) 1 g capsule TAKE 2 CAPSULES BY MOUTH 2 TIMES DAILY.  ? insulin glargine (LANTUS) 100 UNIT/ML injection In the event of insulin pump failure, inject 8 units twice daily  ? insulin lispro (HUMALOG) 100 UNIT/ML injection USE 42 UNITS TO 120 UNITS PER PUMP DAILY AS DIRECTED  ? ipratropium (ATROVENT) 0.03 % nasal spray USE 2 SPRAYS IN EACH NOSTRIL 2-3 TIMES DAILY  ? levocetirizine (XYZAL) 5 MG tablet Take 1 tablet (5 mg total) by mouth every evening.  ? levothyroxine (SYNTHROID) 200 MCG tablet Take 1 tablet (200 mcg total) by mouth daily.  ? levothyroxine (SYNTHROID) 25 MCG tablet Take 0.5 tablets (12.5 mcg total) by mouth daily. Take with the 200 mcg tab.  ? lipase/protease/amylase (CREON) 12000-38000 units CPEP capsule Take 2 capsules prior to meals and 1 capsule prior to snacks  ? LORazepam (ATIVAN) 0.5 MG tablet Take by mouth.  ? losartan (COZAAR) 100 MG tablet Take 100 mg by mouth daily.  ? meclizine (ANTIVERT) 12.5 MG tablet Take 1 tablet (12.5 mg total) by mouth 3 (three) times daily as needed for dizziness.  ? metoCLOPramide (REGLAN) 10 MG tablet Take 10 mg by mouth 4 (four) times daily.  ? metoprolol succinate (TOPROL-XL) 50 MG 24 hr tablet Take 50 mg by mouth 2 (two) times daily.  ? mirtazapine (REMERON) 15 MG tablet Take 1 tablet (15 mg total) by mouth at bedtime.  ?  montelukast (SINGULAIR) 10 MG tablet TAKE 1 TABLET BY MOUTH EVERYDAY AT BEDTIME  ? niacin (NIASPAN) 1000 MG CR tablet TAKE 1 TABLET (1,000 MG TOTAL) BY MOUTH AT BEDTIME.  ? ondansetron (ZOFRAN) 4 MG tablet TAKE 1 TABLET BY MOUTH EVERY 8 HOURS AS NEEDED FOR NAUSEA  ? Probiotic Product (MISC INTESTINAL FLORA REGULAT) CAPS Take 1 capsule by mouth every morning.  ? promethazine (PHENERGAN) 25 MG suppository PLACE 1 SUPPOSITORY (25 MG TOTAL) RECTALLY EVERY 6 (SIX) HOURS AS NEEDED FOR NAUSEA OR VOMITING.  ? rosuvastatin (CRESTOR) 20 MG tablet Take 1 tablet (20 mg total) by mouth at bedtime.  ? sevelamer carbonate (RENVELA) 800 MG tablet Take 800 mg by mouth 3 (three) times daily. 2  tabs before each meal  ? sucralfate (CARAFATE) 1 g tablet Take 1 tablet (1 g total) by mouth 2 (two) times daily.  ? tamsulosin (FLOMAX) 0.4 MG CAPS capsule TAKE 1 CAPSULE BY MOUTH EVERYDAY AT BEDTIME  ? Testosterone 20.25 MG/ACT (1.62%) GEL APPLY 3 PUMPS DAILY AS DIRECTED  ? traZODone (DESYREL) 50 MG tablet Take by mouth.  ? ?No facility-administered encounter medications on file as of 04/27/2021.  ? ? ?Allergies (verified) ?Sulfa antibiotics, Ramipril, and Versed [midazolam]  ? ?History: ?Past Medical History:  ?Diagnosis Date  ? Anemia, iron deficiency On procrit  ? CAP (community acquired pneumonia)   ? CKD (chronic kidney disease) stage 3, GFR 30-59 ml/min (HCC)   ? Degenerative arthritis   ? Depression   ? Dyslipidemia   ? Gastroesophageal reflux disease   ? Gastroparesis diabeticorum ([place])   ? Hematuria, microscopic 10/09  ? work up negative (Dr. Amalia Hailey)  ? HIV positive ([place])   ? Hyperkalemia, diminished renal excretion 06/2011 secondary to TMP/SMZ; prior secondary to  ARBS;   ? Known potassium excretory defect; history of recurrent hyperkalemia due to diabetic renal disease; ACE/ARB contraindicated; hyperkalemia 06/2011 secondary to TMP-SMZ  ? Hypothyroidism   ? IDDM (insulin dependent diabetes mellitus)   ? 36 years  ? Low HDL (under 40)    ? Proteinuria   ? Retinopathy   ? x2  ? SIRS (systemic inflammatory response syndrome) (HCC)   ? ?Past Surgical History:  ?Procedure Laterality Date  ? CATARACT EXTRACTION Right   ? COLONOSCOPY    ?

## 2021-04-28 DIAGNOSIS — N186 End stage renal disease: Secondary | ICD-10-CM | POA: Diagnosis not present

## 2021-04-28 DIAGNOSIS — D631 Anemia in chronic kidney disease: Secondary | ICD-10-CM | POA: Diagnosis not present

## 2021-04-28 DIAGNOSIS — D509 Iron deficiency anemia, unspecified: Secondary | ICD-10-CM | POA: Diagnosis not present

## 2021-04-28 DIAGNOSIS — E8779 Other fluid overload: Secondary | ICD-10-CM | POA: Diagnosis not present

## 2021-04-28 DIAGNOSIS — N2581 Secondary hyperparathyroidism of renal origin: Secondary | ICD-10-CM | POA: Diagnosis not present

## 2021-04-29 ENCOUNTER — Other Ambulatory Visit: Payer: Self-pay | Admitting: Family Medicine

## 2021-04-29 DIAGNOSIS — G4733 Obstructive sleep apnea (adult) (pediatric): Secondary | ICD-10-CM | POA: Diagnosis not present

## 2021-04-29 DIAGNOSIS — R630 Anorexia: Secondary | ICD-10-CM

## 2021-04-30 ENCOUNTER — Other Ambulatory Visit: Payer: Self-pay | Admitting: Internal Medicine

## 2021-04-30 ENCOUNTER — Encounter (INDEPENDENT_AMBULATORY_CARE_PROVIDER_SITE_OTHER): Payer: Medicare Other | Admitting: Ophthalmology

## 2021-04-30 DIAGNOSIS — B20 Human immunodeficiency virus [HIV] disease: Secondary | ICD-10-CM

## 2021-05-01 DIAGNOSIS — N186 End stage renal disease: Secondary | ICD-10-CM | POA: Diagnosis not present

## 2021-05-01 DIAGNOSIS — E8779 Other fluid overload: Secondary | ICD-10-CM | POA: Diagnosis not present

## 2021-05-01 DIAGNOSIS — D631 Anemia in chronic kidney disease: Secondary | ICD-10-CM | POA: Diagnosis not present

## 2021-05-01 DIAGNOSIS — N2581 Secondary hyperparathyroidism of renal origin: Secondary | ICD-10-CM | POA: Diagnosis not present

## 2021-05-01 DIAGNOSIS — D509 Iron deficiency anemia, unspecified: Secondary | ICD-10-CM | POA: Diagnosis not present

## 2021-05-03 DIAGNOSIS — D631 Anemia in chronic kidney disease: Secondary | ICD-10-CM | POA: Diagnosis not present

## 2021-05-03 DIAGNOSIS — N2581 Secondary hyperparathyroidism of renal origin: Secondary | ICD-10-CM | POA: Diagnosis not present

## 2021-05-03 DIAGNOSIS — E8779 Other fluid overload: Secondary | ICD-10-CM | POA: Diagnosis not present

## 2021-05-03 DIAGNOSIS — D509 Iron deficiency anemia, unspecified: Secondary | ICD-10-CM | POA: Diagnosis not present

## 2021-05-03 DIAGNOSIS — N186 End stage renal disease: Secondary | ICD-10-CM | POA: Diagnosis not present

## 2021-05-04 DIAGNOSIS — Z992 Dependence on renal dialysis: Secondary | ICD-10-CM | POA: Diagnosis not present

## 2021-05-04 DIAGNOSIS — N186 End stage renal disease: Secondary | ICD-10-CM | POA: Diagnosis not present

## 2021-05-05 DIAGNOSIS — D631 Anemia in chronic kidney disease: Secondary | ICD-10-CM | POA: Diagnosis not present

## 2021-05-05 DIAGNOSIS — E8779 Other fluid overload: Secondary | ICD-10-CM | POA: Diagnosis not present

## 2021-05-05 DIAGNOSIS — N186 End stage renal disease: Secondary | ICD-10-CM | POA: Diagnosis not present

## 2021-05-05 DIAGNOSIS — D509 Iron deficiency anemia, unspecified: Secondary | ICD-10-CM | POA: Diagnosis not present

## 2021-05-05 DIAGNOSIS — N2581 Secondary hyperparathyroidism of renal origin: Secondary | ICD-10-CM | POA: Diagnosis not present

## 2021-05-07 ENCOUNTER — Encounter: Payer: Self-pay | Admitting: Family Medicine

## 2021-05-07 DIAGNOSIS — R630 Anorexia: Secondary | ICD-10-CM

## 2021-05-08 ENCOUNTER — Other Ambulatory Visit: Payer: Self-pay | Admitting: Family Medicine

## 2021-05-08 ENCOUNTER — Encounter (HOSPITAL_COMMUNITY): Payer: Medicare Other | Admitting: Cardiology

## 2021-05-08 DIAGNOSIS — D509 Iron deficiency anemia, unspecified: Secondary | ICD-10-CM | POA: Diagnosis not present

## 2021-05-08 DIAGNOSIS — N186 End stage renal disease: Secondary | ICD-10-CM | POA: Diagnosis not present

## 2021-05-08 DIAGNOSIS — R053 Chronic cough: Secondary | ICD-10-CM

## 2021-05-08 DIAGNOSIS — D631 Anemia in chronic kidney disease: Secondary | ICD-10-CM | POA: Diagnosis not present

## 2021-05-08 DIAGNOSIS — N2581 Secondary hyperparathyroidism of renal origin: Secondary | ICD-10-CM | POA: Diagnosis not present

## 2021-05-08 DIAGNOSIS — E8779 Other fluid overload: Secondary | ICD-10-CM | POA: Diagnosis not present

## 2021-05-09 MED ORDER — MIRTAZAPINE 30 MG PO TABS: 30.0000 mg | ORAL_TABLET | Freq: Every day | ORAL | 1 refills | Status: DC

## 2021-05-10 DIAGNOSIS — D631 Anemia in chronic kidney disease: Secondary | ICD-10-CM | POA: Diagnosis not present

## 2021-05-10 DIAGNOSIS — E119 Type 2 diabetes mellitus without complications: Secondary | ICD-10-CM | POA: Diagnosis not present

## 2021-05-10 DIAGNOSIS — N2581 Secondary hyperparathyroidism of renal origin: Secondary | ICD-10-CM | POA: Diagnosis not present

## 2021-05-10 DIAGNOSIS — N186 End stage renal disease: Secondary | ICD-10-CM | POA: Diagnosis not present

## 2021-05-10 DIAGNOSIS — E8779 Other fluid overload: Secondary | ICD-10-CM | POA: Diagnosis not present

## 2021-05-10 DIAGNOSIS — D509 Iron deficiency anemia, unspecified: Secondary | ICD-10-CM | POA: Diagnosis not present

## 2021-05-11 ENCOUNTER — Encounter: Payer: Self-pay | Admitting: Family Medicine

## 2021-05-12 DIAGNOSIS — D509 Iron deficiency anemia, unspecified: Secondary | ICD-10-CM | POA: Diagnosis not present

## 2021-05-12 DIAGNOSIS — N2581 Secondary hyperparathyroidism of renal origin: Secondary | ICD-10-CM | POA: Diagnosis not present

## 2021-05-12 DIAGNOSIS — E8779 Other fluid overload: Secondary | ICD-10-CM | POA: Diagnosis not present

## 2021-05-12 DIAGNOSIS — N186 End stage renal disease: Secondary | ICD-10-CM | POA: Diagnosis not present

## 2021-05-12 DIAGNOSIS — D631 Anemia in chronic kidney disease: Secondary | ICD-10-CM | POA: Diagnosis not present

## 2021-05-14 ENCOUNTER — Ambulatory Visit (HOSPITAL_COMMUNITY)
Admission: RE | Admit: 2021-05-14 | Discharge: 2021-05-14 | Disposition: A | Discharge status: Home / Self Care | Payer: Medicare Other | Source: Ambulatory Visit | Attending: Family Medicine | Admitting: Family Medicine

## 2021-05-14 DIAGNOSIS — J810 Acute pulmonary edema: Secondary | ICD-10-CM | POA: Diagnosis not present

## 2021-05-14 LAB — ECHOCARDIOGRAM COMPLETE
Area-P 1/2: 3.53 cm2
S' Lateral: 3.7 cm

## 2021-05-14 NOTE — Progress Notes (Signed)
*  PRELIMINARY RESULTS* ?Echocardiogram ?2D Echocardiogram has been performed. ? ?Samuel Germany ?05/14/2021, 10:00 AM ?

## 2021-05-14 NOTE — Telephone Encounter (Signed)
Can last several weeks. If you feel you are not any better , you will need an in person visit ?

## 2021-05-15 DIAGNOSIS — N186 End stage renal disease: Secondary | ICD-10-CM | POA: Diagnosis not present

## 2021-05-15 DIAGNOSIS — E8779 Other fluid overload: Secondary | ICD-10-CM | POA: Diagnosis not present

## 2021-05-15 DIAGNOSIS — D509 Iron deficiency anemia, unspecified: Secondary | ICD-10-CM | POA: Diagnosis not present

## 2021-05-15 DIAGNOSIS — N2581 Secondary hyperparathyroidism of renal origin: Secondary | ICD-10-CM | POA: Diagnosis not present

## 2021-05-15 DIAGNOSIS — D631 Anemia in chronic kidney disease: Secondary | ICD-10-CM | POA: Diagnosis not present

## 2021-05-17 DIAGNOSIS — N186 End stage renal disease: Secondary | ICD-10-CM | POA: Diagnosis not present

## 2021-05-17 DIAGNOSIS — E8779 Other fluid overload: Secondary | ICD-10-CM | POA: Diagnosis not present

## 2021-05-17 DIAGNOSIS — D631 Anemia in chronic kidney disease: Secondary | ICD-10-CM | POA: Diagnosis not present

## 2021-05-17 DIAGNOSIS — D509 Iron deficiency anemia, unspecified: Secondary | ICD-10-CM | POA: Diagnosis not present

## 2021-05-17 DIAGNOSIS — N2581 Secondary hyperparathyroidism of renal origin: Secondary | ICD-10-CM | POA: Diagnosis not present

## 2021-05-19 DIAGNOSIS — E8779 Other fluid overload: Secondary | ICD-10-CM | POA: Diagnosis not present

## 2021-05-19 DIAGNOSIS — D509 Iron deficiency anemia, unspecified: Secondary | ICD-10-CM | POA: Diagnosis not present

## 2021-05-19 DIAGNOSIS — N2581 Secondary hyperparathyroidism of renal origin: Secondary | ICD-10-CM | POA: Diagnosis not present

## 2021-05-19 DIAGNOSIS — D631 Anemia in chronic kidney disease: Secondary | ICD-10-CM | POA: Diagnosis not present

## 2021-05-19 DIAGNOSIS — N186 End stage renal disease: Secondary | ICD-10-CM | POA: Diagnosis not present

## 2021-05-22 DIAGNOSIS — E8779 Other fluid overload: Secondary | ICD-10-CM | POA: Diagnosis not present

## 2021-05-22 DIAGNOSIS — N2581 Secondary hyperparathyroidism of renal origin: Secondary | ICD-10-CM | POA: Diagnosis not present

## 2021-05-22 DIAGNOSIS — N186 End stage renal disease: Secondary | ICD-10-CM | POA: Diagnosis not present

## 2021-05-22 DIAGNOSIS — D509 Iron deficiency anemia, unspecified: Secondary | ICD-10-CM | POA: Diagnosis not present

## 2021-05-22 DIAGNOSIS — D631 Anemia in chronic kidney disease: Secondary | ICD-10-CM | POA: Diagnosis not present

## 2021-05-23 ENCOUNTER — Encounter (HOSPITAL_COMMUNITY): Payer: Medicare Other | Admitting: Cardiology

## 2021-05-24 DIAGNOSIS — E8779 Other fluid overload: Secondary | ICD-10-CM | POA: Diagnosis not present

## 2021-05-24 DIAGNOSIS — N186 End stage renal disease: Secondary | ICD-10-CM | POA: Diagnosis not present

## 2021-05-24 DIAGNOSIS — N2581 Secondary hyperparathyroidism of renal origin: Secondary | ICD-10-CM | POA: Diagnosis not present

## 2021-05-24 DIAGNOSIS — D509 Iron deficiency anemia, unspecified: Secondary | ICD-10-CM | POA: Diagnosis not present

## 2021-05-24 DIAGNOSIS — D631 Anemia in chronic kidney disease: Secondary | ICD-10-CM | POA: Diagnosis not present

## 2021-05-25 DIAGNOSIS — N186 End stage renal disease: Secondary | ICD-10-CM | POA: Diagnosis not present

## 2021-05-25 DIAGNOSIS — Z79899 Other long term (current) drug therapy: Secondary | ICD-10-CM | POA: Diagnosis not present

## 2021-05-25 DIAGNOSIS — I2510 Atherosclerotic heart disease of native coronary artery without angina pectoris: Secondary | ICD-10-CM | POA: Diagnosis not present

## 2021-05-25 DIAGNOSIS — R9439 Abnormal result of other cardiovascular function study: Secondary | ICD-10-CM | POA: Diagnosis not present

## 2021-05-25 DIAGNOSIS — E1022 Type 1 diabetes mellitus with diabetic chronic kidney disease: Secondary | ICD-10-CM | POA: Diagnosis not present

## 2021-05-26 DIAGNOSIS — D631 Anemia in chronic kidney disease: Secondary | ICD-10-CM | POA: Diagnosis not present

## 2021-05-26 DIAGNOSIS — N186 End stage renal disease: Secondary | ICD-10-CM | POA: Diagnosis not present

## 2021-05-26 DIAGNOSIS — E8779 Other fluid overload: Secondary | ICD-10-CM | POA: Diagnosis not present

## 2021-05-26 DIAGNOSIS — D509 Iron deficiency anemia, unspecified: Secondary | ICD-10-CM | POA: Diagnosis not present

## 2021-05-26 DIAGNOSIS — N2581 Secondary hyperparathyroidism of renal origin: Secondary | ICD-10-CM | POA: Diagnosis not present

## 2021-05-29 ENCOUNTER — Other Ambulatory Visit: Payer: Self-pay | Admitting: *Deleted

## 2021-05-29 DIAGNOSIS — R053 Chronic cough: Secondary | ICD-10-CM

## 2021-05-29 DIAGNOSIS — N2581 Secondary hyperparathyroidism of renal origin: Secondary | ICD-10-CM | POA: Diagnosis not present

## 2021-05-29 DIAGNOSIS — E8779 Other fluid overload: Secondary | ICD-10-CM | POA: Diagnosis not present

## 2021-05-29 DIAGNOSIS — D631 Anemia in chronic kidney disease: Secondary | ICD-10-CM | POA: Diagnosis not present

## 2021-05-29 DIAGNOSIS — N186 End stage renal disease: Secondary | ICD-10-CM | POA: Diagnosis not present

## 2021-05-29 DIAGNOSIS — D509 Iron deficiency anemia, unspecified: Secondary | ICD-10-CM | POA: Diagnosis not present

## 2021-05-30 ENCOUNTER — Ambulatory Visit (INDEPENDENT_AMBULATORY_CARE_PROVIDER_SITE_OTHER): Payer: Medicare Other | Admitting: Pulmonary Disease

## 2021-05-30 ENCOUNTER — Ambulatory Visit: Payer: Medicare Other | Admitting: Pulmonary Disease

## 2021-05-30 ENCOUNTER — Encounter: Payer: Self-pay | Admitting: Pulmonary Disease

## 2021-05-30 VITALS — BP 134/76 | HR 63 | Temp 97.6°F | Ht 70.0 in | Wt 213.4 lb

## 2021-05-30 DIAGNOSIS — Z992 Dependence on renal dialysis: Secondary | ICD-10-CM | POA: Diagnosis not present

## 2021-05-30 DIAGNOSIS — N186 End stage renal disease: Secondary | ICD-10-CM

## 2021-05-30 DIAGNOSIS — R053 Chronic cough: Secondary | ICD-10-CM

## 2021-05-30 DIAGNOSIS — R059 Cough, unspecified: Secondary | ICD-10-CM

## 2021-05-30 DIAGNOSIS — J849 Interstitial pulmonary disease, unspecified: Secondary | ICD-10-CM | POA: Diagnosis not present

## 2021-05-30 DIAGNOSIS — G4733 Obstructive sleep apnea (adult) (pediatric): Secondary | ICD-10-CM

## 2021-05-30 LAB — PULMONARY FUNCTION TEST
DL/VA % pred: 102 %
DL/VA: 4.5 ml/min/mmHg/L
DLCO cor % pred: 53 %
DLCO cor: 15.19 ml/min/mmHg
DLCO unc % pred: 44 %
DLCO unc: 12.83 ml/min/mmHg
FEF 25-75 Post: 1.45 L/sec
FEF 25-75 Pre: 2.31 L/sec
FEF2575-%Change-Post: -36 %
FEF2575-%Pred-Post: 43 %
FEF2575-%Pred-Pre: 69 %
FEV1-%Change-Post: -9 %
FEV1-%Pred-Post: 41 %
FEV1-%Pred-Pre: 45 %
FEV1-Post: 1.59 L
FEV1-Pre: 1.75 L
FEV1FVC-%Change-Post: -13 %
FEV1FVC-%Pred-Pre: 107 %
FEV6-%Change-Post: 11 %
FEV6-%Pred-Post: 46 %
FEV6-%Pred-Pre: 41 %
FEV6-Post: 2.2 L
FEV6-Pre: 1.98 L
FEV6FVC-%Change-Post: 4 %
FEV6FVC-%Pred-Post: 103 %
FEV6FVC-%Pred-Pre: 99 %
FVC-%Change-Post: 4 %
FVC-%Pred-Post: 44 %
FVC-%Pred-Pre: 42 %
FVC-Post: 2.2 L
FVC-Pre: 2.11 L
Post FEV1/FVC ratio: 72 %
Post FEV6/FVC ratio: 100 %
Pre FEV1/FVC ratio: 83 %
Pre FEV6/FVC Ratio: 96 %
RV % pred: 62 %
RV: 1.27 L
TLC % pred: 51 %
TLC: 3.57 L

## 2021-05-30 MED ORDER — CHLORPHENIRAMINE MALEATE 4 MG PO TABS: 4.0000 mg | ORAL_TABLET | ORAL | Status: AC | PRN

## 2021-05-30 MED ORDER — MONTELUKAST SODIUM 10 MG PO TABS: ORAL_TABLET | ORAL | 2 refills | Status: AC

## 2021-05-30 MED ORDER — BUDESONIDE-FORMOTEROL FUMARATE 160-4.5 MCG/ACT IN AERO: 2.0000 | INHALATION_SPRAY | Freq: Two times a day (BID) | RESPIRATORY_TRACT | 6 refills | Status: DC

## 2021-05-30 NOTE — Patient Instructions (Addendum)
?  X refills on symbicort & singulair ? ?X Change xyzal to chlorpheniramine '4mg'$  at bedtime x 3-4 weeks ? ?X HRCT chest to r/o ILD ? ?Discuss with GI doctor whether gastroparesis or esophagus problem can be causing cough ? ? ?

## 2021-05-30 NOTE — Assessment & Plan Note (Signed)
CPAP download was reviewed which shows good control of events on auto CPAP settings with average pressure of 12 cm.  Compliance is sporadic due to his cough and appears to be 4.5 hours on average and close to 6 hours on nights used ? ?

## 2021-05-30 NOTE — Assessment & Plan Note (Signed)
Although he states that he is at his dry weight, he appears to be slightly volume overloaded with pedal edema and perhaps more fluid can be removed on dialysis ?

## 2021-05-30 NOTE — Patient Instructions (Signed)
Full PFT performed today. °

## 2021-05-30 NOTE — Progress Notes (Signed)
? ?  Subjective:  ? ? Patient ID: Patrick Brown, male    DOB: 06/11/1970, 51 y.o.   MRN: [id number] ? ?HPI ? ?51 year old never smoker presents to establish with me for chronic cough. ?He is a patient of Dr. Halford Chessman ? ?PMH : HIV disease, History of PCP pneumonia ([place]) ?ESRD on dialysis (Tuesday/Thursday/Saturday) , on transplant list at [facility] ?-insulin-dependent diabetes-insulin pump  ?-Gastroparesis on Reglan ?OSA on CPAP  ?-LHC, LAD stent ?On Disability , former paramedic. Teaches ACLS classes.  ? ?He is accompanied by his mom who corroborates history, chart is reviewed including last 4 visits in pulmonary office with Dr. Halford Chessman and APP.  Cardiology consultation was reviewed ? ?He reports chronic cough since September 2022 with occasional wheezing, worse when he lies supine at night and with seasonal allergies.  Nebs and hydrocodone seem to help a little bit.  Symbicort and Singulair have not helped. ?He was treated for gastritis with Carafate for 1 month ?He denies obvious postnasal drip or reflux symptoms ? ?02/2021  slow to resolve asthmatic bronchitic exacerbation ?Seen last 04/2021 , treated for CAP with omnicef x 10 days ?CXR - Small bilateral pleural effusions ? ?PFTs and previous imaging were reviewed ? ? ?Significant tests/ events reviewed ? ?PFTs 05/2021 ratio 83, moderate obstruction, severe restriction, FVC 42%, TLC 51%, DLCO 12.8/44% ? ?CT chest wo con 06/2020 >> calcified mediastinal and hilar lymph nodes ?No findings to explain the patient's persistent cough. ? ?PSG 09/26/14 >> AHI 11.3, SpO2 low 86%, PLMI 51.9 ?  ?Underwent FOB 12/15/12 w/ essentially unremarbkable airway and  cytology neg for malignant cells, neg AFB and fungal cx. (final).  ?  ?CT sinus 06/24/20 >> negative ? ?Review of Systems ?neg for any significant sore throat, dysphagia, itching, sneezing, nasal congestion or excess/ purulent secretions, fever, chills, sweats, unintended wt loss, pleuritic or exertional cp, hempoptysis, orthopnea  pnd or change in chronic leg swelling. Also denies presyncope, palpitations, heartburn, abdominal pain, nausea, vomiting, diarrhea or change in bowel or urinary habits, dysuria,hematuria, rash, arthralgias, visual complaints, headache, numbness weakness or ataxia. ? ?   ?Objective:  ? Physical Exam ? ?Gen. Pleasant, well-nourished, in no distress, normal affect ?ENT - no pallor,icterus, no post nasal drip ?Neck: No JVD, no thyromegaly, no carotid bruits ?Lungs: no use of accessory muscles, no dullness to percussion, clear without rales or rhonchi  ?Cardiovascular: Rhythm regular, heart sounds  normal, no murmurs or gallops, 1+ peripheral edema ?Abdomen: soft and non-tender, no hepatosplenomegaly, BS normal. ?Musculoskeletal: No deformities, no cyanosis or clubbing ?Neuro:  alert, non focal ? ? ? ?   ?Assessment & Plan:  ? ? ?

## 2021-05-30 NOTE — Progress Notes (Signed)
Full PFT performed today. °

## 2021-05-30 NOTE — Assessment & Plan Note (Signed)
We will obtain high-resolution CT chest to rule out bronchiectasis as a cause of his chronic cough.  His PFTs show severe intraparenchymal restriction so pulmonary fibrosis will also have to be considered, prior CT a year ago did not show any evidence of this. ?Other possibilities include usual culprits of postnasal drip and nonacid reflux/gastroparesis which he is known to have. ?He has an upcoming visit with gastroenterology and will discuss, he is on Reglan already for gastroparesis.  We discussed nonpharmacological measures such as head of bed elevation during sleep and small portions with at least a 2 to 3-hour window between last meal and bedtime. ?We will also substitute chlorpheniramine for Xyzal as a 4-week trial to treat postnasal drip ?

## 2021-05-31 DIAGNOSIS — D631 Anemia in chronic kidney disease: Secondary | ICD-10-CM | POA: Diagnosis not present

## 2021-05-31 DIAGNOSIS — D509 Iron deficiency anemia, unspecified: Secondary | ICD-10-CM | POA: Diagnosis not present

## 2021-05-31 DIAGNOSIS — N186 End stage renal disease: Secondary | ICD-10-CM | POA: Diagnosis not present

## 2021-05-31 DIAGNOSIS — N2581 Secondary hyperparathyroidism of renal origin: Secondary | ICD-10-CM | POA: Diagnosis not present

## 2021-05-31 DIAGNOSIS — E8779 Other fluid overload: Secondary | ICD-10-CM | POA: Diagnosis not present

## 2021-06-01 DIAGNOSIS — R197 Diarrhea, unspecified: Secondary | ICD-10-CM | POA: Diagnosis not present

## 2021-06-01 DIAGNOSIS — K3184 Gastroparesis: Secondary | ICD-10-CM | POA: Diagnosis not present

## 2021-06-01 DIAGNOSIS — K8689 Other specified diseases of pancreas: Secondary | ICD-10-CM | POA: Diagnosis not present

## 2021-06-01 DIAGNOSIS — N186 End stage renal disease: Secondary | ICD-10-CM | POA: Diagnosis not present

## 2021-06-01 DIAGNOSIS — R110 Nausea: Secondary | ICD-10-CM | POA: Diagnosis not present

## 2021-06-01 DIAGNOSIS — Z79899 Other long term (current) drug therapy: Secondary | ICD-10-CM | POA: Diagnosis not present

## 2021-06-02 DIAGNOSIS — N2581 Secondary hyperparathyroidism of renal origin: Secondary | ICD-10-CM | POA: Diagnosis not present

## 2021-06-02 DIAGNOSIS — D631 Anemia in chronic kidney disease: Secondary | ICD-10-CM | POA: Diagnosis not present

## 2021-06-02 DIAGNOSIS — E8779 Other fluid overload: Secondary | ICD-10-CM | POA: Diagnosis not present

## 2021-06-02 DIAGNOSIS — D509 Iron deficiency anemia, unspecified: Secondary | ICD-10-CM | POA: Diagnosis not present

## 2021-06-02 DIAGNOSIS — N186 End stage renal disease: Secondary | ICD-10-CM | POA: Diagnosis not present

## 2021-06-03 DIAGNOSIS — Z992 Dependence on renal dialysis: Secondary | ICD-10-CM | POA: Diagnosis not present

## 2021-06-03 DIAGNOSIS — N186 End stage renal disease: Secondary | ICD-10-CM | POA: Diagnosis not present

## 2021-06-05 DIAGNOSIS — D631 Anemia in chronic kidney disease: Secondary | ICD-10-CM | POA: Diagnosis not present

## 2021-06-05 DIAGNOSIS — N2581 Secondary hyperparathyroidism of renal origin: Secondary | ICD-10-CM | POA: Diagnosis not present

## 2021-06-05 DIAGNOSIS — N186 End stage renal disease: Secondary | ICD-10-CM | POA: Diagnosis not present

## 2021-06-05 DIAGNOSIS — E8779 Other fluid overload: Secondary | ICD-10-CM | POA: Diagnosis not present

## 2021-06-05 DIAGNOSIS — D509 Iron deficiency anemia, unspecified: Secondary | ICD-10-CM | POA: Diagnosis not present

## 2021-06-07 DIAGNOSIS — D509 Iron deficiency anemia, unspecified: Secondary | ICD-10-CM | POA: Diagnosis not present

## 2021-06-07 DIAGNOSIS — N186 End stage renal disease: Secondary | ICD-10-CM | POA: Diagnosis not present

## 2021-06-07 DIAGNOSIS — E119 Type 2 diabetes mellitus without complications: Secondary | ICD-10-CM | POA: Diagnosis not present

## 2021-06-07 DIAGNOSIS — N2581 Secondary hyperparathyroidism of renal origin: Secondary | ICD-10-CM | POA: Diagnosis not present

## 2021-06-07 DIAGNOSIS — D631 Anemia in chronic kidney disease: Secondary | ICD-10-CM | POA: Diagnosis not present

## 2021-06-07 DIAGNOSIS — E8779 Other fluid overload: Secondary | ICD-10-CM | POA: Diagnosis not present

## 2021-06-08 ENCOUNTER — Ambulatory Visit (INDEPENDENT_AMBULATORY_CARE_PROVIDER_SITE_OTHER): Payer: Medicare Other | Admitting: Family Medicine

## 2021-06-08 ENCOUNTER — Encounter: Payer: Self-pay | Admitting: Family Medicine

## 2021-06-08 VITALS — BP 146/70 | HR 69 | Ht 70.0 in | Wt 208.0 lb

## 2021-06-08 DIAGNOSIS — E039 Hypothyroidism, unspecified: Secondary | ICD-10-CM

## 2021-06-08 DIAGNOSIS — K3184 Gastroparesis: Secondary | ICD-10-CM

## 2021-06-08 DIAGNOSIS — R634 Abnormal weight loss: Secondary | ICD-10-CM | POA: Diagnosis not present

## 2021-06-08 DIAGNOSIS — I2510 Atherosclerotic heart disease of native coronary artery without angina pectoris: Secondary | ICD-10-CM

## 2021-06-08 DIAGNOSIS — Z959 Presence of cardiac and vascular implant and graft, unspecified: Secondary | ICD-10-CM

## 2021-06-08 NOTE — Progress Notes (Signed)
? ?BP (!) 146/70   Pulse 69   Ht '5\' 10"'$  (1.778 m)   Wt 208 lb (94.3 kg)   SpO2 99%   BMI 29.84 kg/m?   ? ?Subjective:  ? ?Patient ID: Patrick Brown, male    DOB: 1970-07-13, 51 y.o.   MRN: [id number] ? ?HPI: ?Patrick Brown is a 51 y.o. male presenting on 06/08/2021 for Hospitalization Follow-up (At [facility] for heart cath, stent placed), Nausea (No appetite- GI switching PPI's to help), and Hypertension (Has been running high per pt. Taking 5 BP meds) ? ? ?HPI ?Hospital follow-up ?Patient is coming in today for hospital follow-up.  He had a cardiac catheterization on 05/25/2021.  He says he is doing well after his cardiac catheterization and is on Plavix and does have follow-up with cardiology and they are managing it.  His blood pressure has been up more but it is actually running slightly better than it has been on 5 medicines and they are asking if that is needed and for now I would continue forward with the 5 medicines.  He is on Cozaar and hydralazine and Toprol and amlodipine. ? ?He says his biggest issue of the hospital follow-up is that he still is having some nausea and decreased appetite.  I did switch his Creon which she did start taking only for him to switch Nexium to Protonix which she has not started but he wanted to get the Creon sometime and see how it would do before switching.  He does say he has a little bit more but not much and he has lost a little bit of weight over the past few months. ? ?Hypothyroidism recheck ?Patient is coming in for thyroid recheck today as well. They deny any issues with hair changes or heat or cold problems or diarrhea or constipation. They deny any chest pain or palpitations. They are currently on levothyroxine 25 micrograms  ? ?Relevant past medical, surgical, family and social history reviewed and updated as indicated. Interim medical history since our last visit reviewed. ?Allergies and medications reviewed and updated. ? ?Review of Systems  ?Constitutional:   Positive for appetite change and unexpected weight change. Negative for chills and fever.  ?Eyes:  Negative for visual disturbance.  ?Respiratory:  Negative for shortness of breath and wheezing.   ?Cardiovascular:  Positive for leg swelling. Negative for chest pain.  ?Musculoskeletal:  Negative for back pain and gait problem.  ?Skin:  Negative for rash.  ?Neurological:  Negative for dizziness, weakness and light-headedness.  ?All other systems reviewed and are negative. ? ?Per HPI unless specifically indicated above ? ? ?Allergies as of 06/08/2021   ? ?   Reactions  ? Sulfa Antibiotics Other (See Comments)  ? High potassium  ? Ramipril Cough  ? Versed [midazolam] Other (See Comments)  ? "I don't wake up very good or clear it out of my system"  ? ?  ? ?  ?Medication List  ?  ? ?  ? Accurate as of Jun 08, 2021  9:47 AM. If you have any questions, ask your nurse or doctor.  ?  ?  ? ?  ? ?STOP taking these medications   ? ?esomeprazole 40 MG capsule ?Commonly known as: [place] ?Stopped by: Worthy Rancher, MD ?  ? ?  ? ?TAKE these medications   ? ?acetaminophen 500 MG tablet ?Commonly known as: TYLENOL ?Take 1,000 mg by mouth 2 (two) times daily. ?  ?acyclovir 400 MG tablet ?Commonly known as: ZOVIRAX ?Takes mondays,  wednesdays and fridays ?  ?AgaMatrix Ultra-Thin Lancets Misc ?TEST BS 4 TIMES A DAY AND AS NEEDED DX E10.65 ?  ?albuterol (2.5 MG/3ML) 0.083% nebulizer solution ?Commonly known as: PROVENTIL ?Take 3 mLs (2.5 mg total) by nebulization every 6 (six) hours as needed for wheezing or shortness of breath. ?  ?albuterol 108 (90 Base) MCG/ACT inhaler ?Commonly known as: VENTOLIN HFA ?Inhale 2 puffs into the lungs every 6 (six) hours as needed for wheezing or shortness of breath. ?  ?amLODipine 10 MG tablet ?Commonly known as: NORVASC ?Take 5 mg by mouth at bedtime as needed. ?  ?aspirin 81 MG chewable tablet ?Chew 81 mg by mouth every morning. ?  ?benzonatate 200 MG capsule ?Commonly known as: TESSALON ?Take 1  capsule (200 mg total) by mouth 3 (three) times daily as needed for cough. ?  ?budesonide-formoterol 160-4.5 MCG/ACT inhaler ?Commonly known as: Symbicort ?Inhale 2 puffs into the lungs in the morning and at bedtime. ?  ?clopidogrel 75 MG tablet ?Commonly known as: PLAVIX ?Take 40 mg by mouth daily. Take daily ?  ?cyclobenzaprine 10 MG tablet ?Commonly known as: FLEXERIL ?TAKE 1 TABLET BY MOUTH THREE TIMES A DAY AS NEEDED FOR MUSCLE SPASMS ?  ?diclofenac sodium 1 % Gel ?Commonly known as: VOLTAREN ?Apply 2 g topically 4 (four) times daily. ?  ?diphenoxylate-atropine 2.5-0.025 MG tablet ?Commonly known as: LOMOTIL ?TAKE 1 TABLET BY MOUTH 4 (FOUR) TIMES DAILY AS NEEDED FOR DIARRHEA OR LOOSE STOOLS. ?  ?Dovato 50-300 MG tablet ?Generic drug: dolutegravir-lamiVUDine ?TAKE 1 TABLET BY MOUTH DAILY ?  ?doxazosin 1 MG tablet ?Commonly known as: CARDURA ?Take 1 mg by mouth daily. ?  ?DULoxetine 60 MG capsule ?Commonly known as: CYMBALTA ?Take 1 capsule (60 mg total) by mouth daily. Take with 30 mg fot a total of '90mg'$  ?  ?DULoxetine 30 MG capsule ?Commonly known as: CYMBALTA ?Take 1 capsule (30 mg total) by mouth daily. TAKE WITH THE '60MG'$  FOR TOTAL OF '90MG'$  ?  ?febuxostat 40 MG tablet ?Commonly known as: ULORIC ?Take 1 tablet (40 mg total) by mouth daily. ?  ?ferrous sulfate 325 (65 FE) MG tablet ?Take 650 mg by mouth daily with breakfast. ?  ?fluticasone 50 MCG/ACT nasal spray ?Commonly known as: FLONASE ?SPRAY 2 SPRAYS INTO EACH NOSTRIL EVERY DAY ?  ?furosemide 40 MG tablet ?Commonly known as: LASIX ?Take 40 mg by mouth daily. 40 mg nightly ?  ?GLUCOSAMINE 1500 COMPLEX PO ?Take 1 tablet by mouth 2 (two) times daily. ?  ?glucosamine-chondroitin 500-400 MG tablet ?Take by mouth. ?  ?glucose blood test strip ?Commonly known as: OneTouch Verio ?TEST BLOOD SUGAR 4 TIMES DAILY AND AS NEEDED ?  ?hydrALAZINE 100 MG tablet ?Commonly known as: APRESOLINE ?Take by mouth. ?  ?HYDROcodone bit-homatropine 5-1.5 MG/5ML syrup ?Commonly  known as: HYCODAN ?Take 5 mLs by mouth every 6 (six) hours as needed for cough. ?  ?hyoscyamine 0.125 MG tablet ?Commonly known as: LEVSIN ?TAKE 1 TABLET (0.125 MG TOTAL) BY MOUTH EVERY 4 (FOUR) HOURS AS NEEDED. ?  ?icosapent Ethyl 1 g capsule ?Commonly known as: VASCEPA ?TAKE 2 CAPSULES BY MOUTH 2 TIMES DAILY. ?  ?insulin glargine 100 UNIT/ML injection ?Commonly known as: LANTUS ?In the event of insulin pump failure, inject 8 units twice daily ?  ?insulin lispro 100 UNIT/ML injection ?Commonly known as: HumaLOG ?USE 42 UNITS TO 120 UNITS PER PUMP DAILY AS DIRECTED ?  ?ipratropium 0.03 % nasal spray ?Commonly known as: ATROVENT ?USE 2 SPRAYS IN EACH NOSTRIL 2-3  TIMES DAILY ?  ?levocetirizine 5 MG tablet ?Commonly known as: XYZAL ?TAKE 1 TABLET BY MOUTH EVERY DAY IN THE EVENING ?  ?levothyroxine 200 MCG tablet ?Commonly known as: SYNTHROID ?Take 1 tablet (200 mcg total) by mouth daily. ?  ?levothyroxine 25 MCG tablet ?Commonly known as: SYNTHROID ?Take 0.5 tablets (12.5 mcg total) by mouth daily. Take with the 200 mcg tab. ?  ?lipase/protease/amylase 12000-38000 units Cpep capsule ?Commonly known as: CREON ?Take 2 capsules prior to meals and 1 capsule prior to snacks ?  ?LORazepam 0.5 MG tablet ?Commonly known as: ATIVAN ?Take by mouth. ?  ?losartan 100 MG tablet ?Commonly known as: COZAAR ?Take 100 mg by mouth daily. ?  ?meclizine 12.5 MG tablet ?Commonly known as: ANTIVERT ?Take 1 tablet (12.5 mg total) by mouth 3 (three) times daily as needed for dizziness. ?  ?metoCLOPramide 10 MG tablet ?Commonly known as: REGLAN ?Take 10 mg by mouth 4 (four) times daily. ?  ?metoprolol succinate 50 MG 24 hr tablet ?Commonly known as: TOPROL-XL ?Take 50 mg by mouth 2 (two) times daily. ?  ?mirtazapine 30 MG tablet ?Commonly known as: REMERON ?Take 1 tablet (30 mg total) by mouth at bedtime. ?  ?Misc Intestinal Flora Regulat Caps ?Take 1 capsule by mouth every morning. ?  ?montelukast 10 MG tablet ?Commonly known as:  SINGULAIR ?TAKE 1 TABLET BY MOUTH EVERYDAY AT BEDTIME ?  ?niacin 1000 MG CR tablet ?Commonly known as: NIASPAN ?TAKE 1 TABLET (1,000 MG TOTAL) BY MOUTH AT BEDTIME. ?  ?ondansetron 4 MG tablet ?Commonly known as: ZOFRAN ?TAKE 1 TABLET

## 2021-06-09 ENCOUNTER — Other Ambulatory Visit: Payer: Self-pay | Admitting: Family Medicine

## 2021-06-09 DIAGNOSIS — E8779 Other fluid overload: Secondary | ICD-10-CM | POA: Diagnosis not present

## 2021-06-09 DIAGNOSIS — D509 Iron deficiency anemia, unspecified: Secondary | ICD-10-CM | POA: Diagnosis not present

## 2021-06-09 DIAGNOSIS — N2581 Secondary hyperparathyroidism of renal origin: Secondary | ICD-10-CM | POA: Diagnosis not present

## 2021-06-09 DIAGNOSIS — D631 Anemia in chronic kidney disease: Secondary | ICD-10-CM | POA: Diagnosis not present

## 2021-06-09 DIAGNOSIS — N186 End stage renal disease: Secondary | ICD-10-CM | POA: Diagnosis not present

## 2021-06-09 LAB — THYROID PANEL WITH TSH
Free Thyroxine Index: 2.2 (ref 1.2–4.9)
T3 Uptake Ratio: 29 % (ref 24–39)
T4, Total: 7.6 ug/dL (ref 4.5–12.0)
TSH: 80 u[IU]/mL — ABNORMAL HIGH (ref 0.450–4.500)

## 2021-06-12 DIAGNOSIS — D509 Iron deficiency anemia, unspecified: Secondary | ICD-10-CM | POA: Diagnosis not present

## 2021-06-12 DIAGNOSIS — E8779 Other fluid overload: Secondary | ICD-10-CM | POA: Diagnosis not present

## 2021-06-12 DIAGNOSIS — N2581 Secondary hyperparathyroidism of renal origin: Secondary | ICD-10-CM | POA: Diagnosis not present

## 2021-06-12 DIAGNOSIS — N186 End stage renal disease: Secondary | ICD-10-CM | POA: Diagnosis not present

## 2021-06-12 DIAGNOSIS — D631 Anemia in chronic kidney disease: Secondary | ICD-10-CM | POA: Diagnosis not present

## 2021-06-14 DIAGNOSIS — D631 Anemia in chronic kidney disease: Secondary | ICD-10-CM | POA: Diagnosis not present

## 2021-06-14 DIAGNOSIS — N186 End stage renal disease: Secondary | ICD-10-CM | POA: Diagnosis not present

## 2021-06-14 DIAGNOSIS — E8779 Other fluid overload: Secondary | ICD-10-CM | POA: Diagnosis not present

## 2021-06-14 DIAGNOSIS — D509 Iron deficiency anemia, unspecified: Secondary | ICD-10-CM | POA: Diagnosis not present

## 2021-06-14 DIAGNOSIS — N2581 Secondary hyperparathyroidism of renal origin: Secondary | ICD-10-CM | POA: Diagnosis not present

## 2021-06-15 ENCOUNTER — Encounter (HOSPITAL_COMMUNITY): Payer: Medicare Other | Admitting: Cardiology

## 2021-06-16 DIAGNOSIS — N2581 Secondary hyperparathyroidism of renal origin: Secondary | ICD-10-CM | POA: Diagnosis not present

## 2021-06-16 DIAGNOSIS — D509 Iron deficiency anemia, unspecified: Secondary | ICD-10-CM | POA: Diagnosis not present

## 2021-06-16 DIAGNOSIS — E8779 Other fluid overload: Secondary | ICD-10-CM | POA: Diagnosis not present

## 2021-06-16 DIAGNOSIS — N186 End stage renal disease: Secondary | ICD-10-CM | POA: Diagnosis not present

## 2021-06-16 DIAGNOSIS — D631 Anemia in chronic kidney disease: Secondary | ICD-10-CM | POA: Diagnosis not present

## 2021-06-18 DIAGNOSIS — R93421 Abnormal radiologic findings on diagnostic imaging of right kidney: Secondary | ICD-10-CM | POA: Diagnosis not present

## 2021-06-18 DIAGNOSIS — R110 Nausea: Secondary | ICD-10-CM | POA: Diagnosis not present

## 2021-06-18 DIAGNOSIS — R932 Abnormal findings on diagnostic imaging of liver and biliary tract: Secondary | ICD-10-CM | POA: Diagnosis not present

## 2021-06-19 DIAGNOSIS — D631 Anemia in chronic kidney disease: Secondary | ICD-10-CM | POA: Diagnosis not present

## 2021-06-19 DIAGNOSIS — N2581 Secondary hyperparathyroidism of renal origin: Secondary | ICD-10-CM | POA: Diagnosis not present

## 2021-06-19 DIAGNOSIS — N186 End stage renal disease: Secondary | ICD-10-CM | POA: Diagnosis not present

## 2021-06-19 DIAGNOSIS — D509 Iron deficiency anemia, unspecified: Secondary | ICD-10-CM | POA: Diagnosis not present

## 2021-06-19 DIAGNOSIS — E8779 Other fluid overload: Secondary | ICD-10-CM | POA: Diagnosis not present

## 2021-06-20 ENCOUNTER — Encounter: Payer: Self-pay | Admitting: Family Medicine

## 2021-06-20 DIAGNOSIS — E039 Hypothyroidism, unspecified: Secondary | ICD-10-CM

## 2021-06-20 MED ORDER — LEVOTHYROXINE SODIUM 50 MCG PO TABS: 50.0000 ug | ORAL_TABLET | Freq: Every day | ORAL | 3 refills | Status: DC

## 2021-06-21 DIAGNOSIS — N2581 Secondary hyperparathyroidism of renal origin: Secondary | ICD-10-CM | POA: Diagnosis not present

## 2021-06-21 DIAGNOSIS — N186 End stage renal disease: Secondary | ICD-10-CM | POA: Diagnosis not present

## 2021-06-21 DIAGNOSIS — E8779 Other fluid overload: Secondary | ICD-10-CM | POA: Diagnosis not present

## 2021-06-21 DIAGNOSIS — D509 Iron deficiency anemia, unspecified: Secondary | ICD-10-CM | POA: Diagnosis not present

## 2021-06-21 DIAGNOSIS — D631 Anemia in chronic kidney disease: Secondary | ICD-10-CM | POA: Diagnosis not present

## 2021-06-23 DIAGNOSIS — N186 End stage renal disease: Secondary | ICD-10-CM | POA: Diagnosis not present

## 2021-06-23 DIAGNOSIS — D509 Iron deficiency anemia, unspecified: Secondary | ICD-10-CM | POA: Diagnosis not present

## 2021-06-23 DIAGNOSIS — D631 Anemia in chronic kidney disease: Secondary | ICD-10-CM | POA: Diagnosis not present

## 2021-06-23 DIAGNOSIS — E8779 Other fluid overload: Secondary | ICD-10-CM | POA: Diagnosis not present

## 2021-06-23 DIAGNOSIS — N2581 Secondary hyperparathyroidism of renal origin: Secondary | ICD-10-CM | POA: Diagnosis not present

## 2021-06-26 ENCOUNTER — Telehealth: Payer: Self-pay | Admitting: Family Medicine

## 2021-06-27 NOTE — Telephone Encounter (Signed)
Left message for mom or pt to return call. Can schedule today if needed.

## 2021-06-27 NOTE — Telephone Encounter (Signed)
He is very complicated with all of the medicines that he is on, if he does want to try something else then please make an appointment and we can discuss options

## 2021-06-28 DIAGNOSIS — N186 End stage renal disease: Secondary | ICD-10-CM | POA: Diagnosis not present

## 2021-06-28 DIAGNOSIS — D631 Anemia in chronic kidney disease: Secondary | ICD-10-CM | POA: Diagnosis not present

## 2021-06-28 DIAGNOSIS — N2581 Secondary hyperparathyroidism of renal origin: Secondary | ICD-10-CM | POA: Diagnosis not present

## 2021-06-28 DIAGNOSIS — E8779 Other fluid overload: Secondary | ICD-10-CM | POA: Diagnosis not present

## 2021-06-28 DIAGNOSIS — D509 Iron deficiency anemia, unspecified: Secondary | ICD-10-CM | POA: Diagnosis not present

## 2021-06-29 ENCOUNTER — Other Ambulatory Visit: Payer: Self-pay | Admitting: Family Medicine

## 2021-06-29 DIAGNOSIS — Z992 Dependence on renal dialysis: Secondary | ICD-10-CM | POA: Diagnosis not present

## 2021-06-29 DIAGNOSIS — E1022 Type 1 diabetes mellitus with diabetic chronic kidney disease: Secondary | ICD-10-CM | POA: Diagnosis not present

## 2021-06-29 DIAGNOSIS — N186 End stage renal disease: Secondary | ICD-10-CM | POA: Diagnosis not present

## 2021-06-29 DIAGNOSIS — Z7982 Long term (current) use of aspirin: Secondary | ICD-10-CM | POA: Diagnosis not present

## 2021-06-29 DIAGNOSIS — I2510 Atherosclerotic heart disease of native coronary artery without angina pectoris: Secondary | ICD-10-CM | POA: Diagnosis not present

## 2021-06-29 DIAGNOSIS — I120 Hypertensive chronic kidney disease with stage 5 chronic kidney disease or end stage renal disease: Secondary | ICD-10-CM | POA: Diagnosis not present

## 2021-06-29 DIAGNOSIS — E785 Hyperlipidemia, unspecified: Secondary | ICD-10-CM | POA: Diagnosis not present

## 2021-06-29 DIAGNOSIS — Z7902 Long term (current) use of antithrombotics/antiplatelets: Secondary | ICD-10-CM | POA: Diagnosis not present

## 2021-06-29 DIAGNOSIS — Z9889 Other specified postprocedural states: Secondary | ICD-10-CM | POA: Diagnosis not present

## 2021-06-29 DIAGNOSIS — E7800 Pure hypercholesterolemia, unspecified: Secondary | ICD-10-CM | POA: Diagnosis not present

## 2021-06-30 DIAGNOSIS — D509 Iron deficiency anemia, unspecified: Secondary | ICD-10-CM | POA: Diagnosis not present

## 2021-06-30 DIAGNOSIS — E8779 Other fluid overload: Secondary | ICD-10-CM | POA: Diagnosis not present

## 2021-06-30 DIAGNOSIS — N2581 Secondary hyperparathyroidism of renal origin: Secondary | ICD-10-CM | POA: Diagnosis not present

## 2021-06-30 DIAGNOSIS — D631 Anemia in chronic kidney disease: Secondary | ICD-10-CM | POA: Diagnosis not present

## 2021-06-30 DIAGNOSIS — N186 End stage renal disease: Secondary | ICD-10-CM | POA: Diagnosis not present

## 2021-07-03 DIAGNOSIS — D631 Anemia in chronic kidney disease: Secondary | ICD-10-CM | POA: Diagnosis not present

## 2021-07-03 DIAGNOSIS — N186 End stage renal disease: Secondary | ICD-10-CM | POA: Diagnosis not present

## 2021-07-03 DIAGNOSIS — N2581 Secondary hyperparathyroidism of renal origin: Secondary | ICD-10-CM | POA: Diagnosis not present

## 2021-07-03 DIAGNOSIS — E8779 Other fluid overload: Secondary | ICD-10-CM | POA: Diagnosis not present

## 2021-07-03 DIAGNOSIS — D509 Iron deficiency anemia, unspecified: Secondary | ICD-10-CM | POA: Diagnosis not present

## 2021-07-04 ENCOUNTER — Ambulatory Visit: Payer: Medicare Other | Admitting: Adult Health

## 2021-07-04 DIAGNOSIS — Z992 Dependence on renal dialysis: Secondary | ICD-10-CM | POA: Diagnosis not present

## 2021-07-04 DIAGNOSIS — N186 End stage renal disease: Secondary | ICD-10-CM | POA: Diagnosis not present

## 2021-07-05 ENCOUNTER — Ambulatory Visit (HOSPITAL_COMMUNITY)
Admission: RE | Admit: 2021-07-05 | Discharge: 2021-07-05 | Disposition: A | Discharge status: Home / Self Care | Payer: Medicare Other | Source: Ambulatory Visit | Attending: Pulmonary Disease | Admitting: Pulmonary Disease

## 2021-07-05 DIAGNOSIS — I898 Other specified noninfective disorders of lymphatic vessels and lymph nodes: Secondary | ICD-10-CM | POA: Diagnosis not present

## 2021-07-05 DIAGNOSIS — D509 Iron deficiency anemia, unspecified: Secondary | ICD-10-CM | POA: Diagnosis not present

## 2021-07-05 DIAGNOSIS — N186 End stage renal disease: Secondary | ICD-10-CM | POA: Diagnosis not present

## 2021-07-05 DIAGNOSIS — J849 Interstitial pulmonary disease, unspecified: Secondary | ICD-10-CM | POA: Diagnosis not present

## 2021-07-05 DIAGNOSIS — D631 Anemia in chronic kidney disease: Secondary | ICD-10-CM | POA: Diagnosis not present

## 2021-07-05 DIAGNOSIS — I2510 Atherosclerotic heart disease of native coronary artery without angina pectoris: Secondary | ICD-10-CM | POA: Diagnosis not present

## 2021-07-05 DIAGNOSIS — J90 Pleural effusion, not elsewhere classified: Secondary | ICD-10-CM | POA: Diagnosis not present

## 2021-07-05 DIAGNOSIS — E8779 Other fluid overload: Secondary | ICD-10-CM | POA: Diagnosis not present

## 2021-07-05 DIAGNOSIS — J9811 Atelectasis: Secondary | ICD-10-CM | POA: Diagnosis not present

## 2021-07-05 DIAGNOSIS — N2581 Secondary hyperparathyroidism of renal origin: Secondary | ICD-10-CM | POA: Diagnosis not present

## 2021-07-07 DIAGNOSIS — N2581 Secondary hyperparathyroidism of renal origin: Secondary | ICD-10-CM | POA: Diagnosis not present

## 2021-07-07 DIAGNOSIS — N186 End stage renal disease: Secondary | ICD-10-CM | POA: Diagnosis not present

## 2021-07-07 DIAGNOSIS — D509 Iron deficiency anemia, unspecified: Secondary | ICD-10-CM | POA: Diagnosis not present

## 2021-07-07 DIAGNOSIS — D631 Anemia in chronic kidney disease: Secondary | ICD-10-CM | POA: Diagnosis not present

## 2021-07-07 DIAGNOSIS — E8779 Other fluid overload: Secondary | ICD-10-CM | POA: Diagnosis not present

## 2021-07-09 ENCOUNTER — Ambulatory Visit: Payer: Medicare Other | Admitting: Adult Health

## 2021-07-10 DIAGNOSIS — E8779 Other fluid overload: Secondary | ICD-10-CM | POA: Diagnosis not present

## 2021-07-10 DIAGNOSIS — D509 Iron deficiency anemia, unspecified: Secondary | ICD-10-CM | POA: Diagnosis not present

## 2021-07-10 DIAGNOSIS — N186 End stage renal disease: Secondary | ICD-10-CM | POA: Diagnosis not present

## 2021-07-10 DIAGNOSIS — D631 Anemia in chronic kidney disease: Secondary | ICD-10-CM | POA: Diagnosis not present

## 2021-07-10 DIAGNOSIS — N2581 Secondary hyperparathyroidism of renal origin: Secondary | ICD-10-CM | POA: Diagnosis not present

## 2021-07-12 DIAGNOSIS — N2581 Secondary hyperparathyroidism of renal origin: Secondary | ICD-10-CM | POA: Diagnosis not present

## 2021-07-12 DIAGNOSIS — E8779 Other fluid overload: Secondary | ICD-10-CM | POA: Diagnosis not present

## 2021-07-12 DIAGNOSIS — N186 End stage renal disease: Secondary | ICD-10-CM | POA: Diagnosis not present

## 2021-07-12 DIAGNOSIS — E119 Type 2 diabetes mellitus without complications: Secondary | ICD-10-CM | POA: Diagnosis not present

## 2021-07-12 DIAGNOSIS — D631 Anemia in chronic kidney disease: Secondary | ICD-10-CM | POA: Diagnosis not present

## 2021-07-12 DIAGNOSIS — D509 Iron deficiency anemia, unspecified: Secondary | ICD-10-CM | POA: Diagnosis not present

## 2021-07-13 ENCOUNTER — Other Ambulatory Visit: Payer: Self-pay | Admitting: Family Medicine

## 2021-07-14 DIAGNOSIS — N186 End stage renal disease: Secondary | ICD-10-CM | POA: Diagnosis not present

## 2021-07-14 DIAGNOSIS — E8779 Other fluid overload: Secondary | ICD-10-CM | POA: Diagnosis not present

## 2021-07-14 DIAGNOSIS — D509 Iron deficiency anemia, unspecified: Secondary | ICD-10-CM | POA: Diagnosis not present

## 2021-07-14 DIAGNOSIS — D631 Anemia in chronic kidney disease: Secondary | ICD-10-CM | POA: Diagnosis not present

## 2021-07-14 DIAGNOSIS — N2581 Secondary hyperparathyroidism of renal origin: Secondary | ICD-10-CM | POA: Diagnosis not present

## 2021-07-17 ENCOUNTER — Telehealth: Payer: Self-pay | Admitting: *Deleted

## 2021-07-17 DIAGNOSIS — E8779 Other fluid overload: Secondary | ICD-10-CM | POA: Diagnosis not present

## 2021-07-17 DIAGNOSIS — D509 Iron deficiency anemia, unspecified: Secondary | ICD-10-CM | POA: Diagnosis not present

## 2021-07-17 DIAGNOSIS — N186 End stage renal disease: Secondary | ICD-10-CM | POA: Diagnosis not present

## 2021-07-17 DIAGNOSIS — D631 Anemia in chronic kidney disease: Secondary | ICD-10-CM | POA: Diagnosis not present

## 2021-07-17 DIAGNOSIS — N2581 Secondary hyperparathyroidism of renal origin: Secondary | ICD-10-CM | POA: Diagnosis not present

## 2021-07-17 NOTE — Telephone Encounter (Signed)
ATC patient on home and mobile #, left vm for patient to bring SD card if he has one in his CPAP machine.  Called and left vm for Brad to see if this patient has a machine that is still transmitting a signal.  The last download is from 4 months ago.  I asked that if there is something more current to fax it to [phone].

## 2021-07-17 NOTE — Telephone Encounter (Signed)
Pt has appointment in am with Rexene Edison NP, will address at appointment as patient did not answer when called this am.  Nothing further needed.

## 2021-07-18 ENCOUNTER — Ambulatory Visit: Payer: Medicare Other | Admitting: Adult Health

## 2021-07-18 NOTE — Telephone Encounter (Signed)
Attempts to contact pt without return call in over 3 days, will close encounter. 

## 2021-07-19 DIAGNOSIS — D631 Anemia in chronic kidney disease: Secondary | ICD-10-CM | POA: Diagnosis not present

## 2021-07-19 DIAGNOSIS — N186 End stage renal disease: Secondary | ICD-10-CM | POA: Diagnosis not present

## 2021-07-19 DIAGNOSIS — D509 Iron deficiency anemia, unspecified: Secondary | ICD-10-CM | POA: Diagnosis not present

## 2021-07-19 DIAGNOSIS — N2581 Secondary hyperparathyroidism of renal origin: Secondary | ICD-10-CM | POA: Diagnosis not present

## 2021-07-19 DIAGNOSIS — E8779 Other fluid overload: Secondary | ICD-10-CM | POA: Diagnosis not present

## 2021-07-21 DIAGNOSIS — E8779 Other fluid overload: Secondary | ICD-10-CM | POA: Diagnosis not present

## 2021-07-21 DIAGNOSIS — N2581 Secondary hyperparathyroidism of renal origin: Secondary | ICD-10-CM | POA: Diagnosis not present

## 2021-07-21 DIAGNOSIS — D509 Iron deficiency anemia, unspecified: Secondary | ICD-10-CM | POA: Diagnosis not present

## 2021-07-21 DIAGNOSIS — D631 Anemia in chronic kidney disease: Secondary | ICD-10-CM | POA: Diagnosis not present

## 2021-07-21 DIAGNOSIS — N186 End stage renal disease: Secondary | ICD-10-CM | POA: Diagnosis not present

## 2021-07-24 DIAGNOSIS — D631 Anemia in chronic kidney disease: Secondary | ICD-10-CM | POA: Diagnosis not present

## 2021-07-24 DIAGNOSIS — E8779 Other fluid overload: Secondary | ICD-10-CM | POA: Diagnosis not present

## 2021-07-24 DIAGNOSIS — D509 Iron deficiency anemia, unspecified: Secondary | ICD-10-CM | POA: Diagnosis not present

## 2021-07-24 DIAGNOSIS — N2581 Secondary hyperparathyroidism of renal origin: Secondary | ICD-10-CM | POA: Diagnosis not present

## 2021-07-24 DIAGNOSIS — N186 End stage renal disease: Secondary | ICD-10-CM | POA: Diagnosis not present

## 2021-07-26 DIAGNOSIS — D509 Iron deficiency anemia, unspecified: Secondary | ICD-10-CM | POA: Diagnosis not present

## 2021-07-26 DIAGNOSIS — N2581 Secondary hyperparathyroidism of renal origin: Secondary | ICD-10-CM | POA: Diagnosis not present

## 2021-07-26 DIAGNOSIS — E8779 Other fluid overload: Secondary | ICD-10-CM | POA: Diagnosis not present

## 2021-07-26 DIAGNOSIS — N186 End stage renal disease: Secondary | ICD-10-CM | POA: Diagnosis not present

## 2021-07-26 DIAGNOSIS — D631 Anemia in chronic kidney disease: Secondary | ICD-10-CM | POA: Diagnosis not present

## 2021-07-28 ENCOUNTER — Other Ambulatory Visit: Payer: Self-pay | Admitting: Family Medicine

## 2021-07-28 DIAGNOSIS — E8779 Other fluid overload: Secondary | ICD-10-CM | POA: Diagnosis not present

## 2021-07-28 DIAGNOSIS — R630 Anorexia: Secondary | ICD-10-CM

## 2021-07-28 DIAGNOSIS — N2581 Secondary hyperparathyroidism of renal origin: Secondary | ICD-10-CM | POA: Diagnosis not present

## 2021-07-28 DIAGNOSIS — D509 Iron deficiency anemia, unspecified: Secondary | ICD-10-CM | POA: Diagnosis not present

## 2021-07-28 DIAGNOSIS — N186 End stage renal disease: Secondary | ICD-10-CM | POA: Diagnosis not present

## 2021-07-28 DIAGNOSIS — D631 Anemia in chronic kidney disease: Secondary | ICD-10-CM | POA: Diagnosis not present

## 2021-07-30 ENCOUNTER — Ambulatory Visit (INDEPENDENT_AMBULATORY_CARE_PROVIDER_SITE_OTHER): Payer: Medicare Other | Admitting: Ophthalmology

## 2021-07-30 ENCOUNTER — Encounter (INDEPENDENT_AMBULATORY_CARE_PROVIDER_SITE_OTHER): Payer: Self-pay | Admitting: Ophthalmology

## 2021-07-30 DIAGNOSIS — H179 Unspecified corneal scar and opacity: Secondary | ICD-10-CM

## 2021-07-30 DIAGNOSIS — H26491 Other secondary cataract, right eye: Secondary | ICD-10-CM | POA: Insufficient documentation

## 2021-07-30 DIAGNOSIS — E103511 Type 1 diabetes mellitus with proliferative diabetic retinopathy with macular edema, right eye: Secondary | ICD-10-CM | POA: Diagnosis not present

## 2021-07-30 DIAGNOSIS — H35372 Puckering of macula, left eye: Secondary | ICD-10-CM | POA: Diagnosis not present

## 2021-07-30 DIAGNOSIS — E103552 Type 1 diabetes mellitus with stable proliferative diabetic retinopathy, left eye: Secondary | ICD-10-CM | POA: Diagnosis not present

## 2021-07-30 DIAGNOSIS — E103551 Type 1 diabetes mellitus with stable proliferative diabetic retinopathy, right eye: Secondary | ICD-10-CM | POA: Diagnosis not present

## 2021-07-30 NOTE — Assessment & Plan Note (Signed)
Mild by OCT, no active impairment of acuity

## 2021-07-30 NOTE — Assessment & Plan Note (Signed)
Doctor' "Sincision"  at [facility] for corneal scarring

## 2021-07-30 NOTE — Assessment & Plan Note (Signed)
Consider YAG capsulotomy right eye in order to clear visual axis

## 2021-07-31 DIAGNOSIS — N2581 Secondary hyperparathyroidism of renal origin: Secondary | ICD-10-CM | POA: Diagnosis not present

## 2021-07-31 DIAGNOSIS — D631 Anemia in chronic kidney disease: Secondary | ICD-10-CM | POA: Diagnosis not present

## 2021-07-31 DIAGNOSIS — E8779 Other fluid overload: Secondary | ICD-10-CM | POA: Diagnosis not present

## 2021-07-31 DIAGNOSIS — N186 End stage renal disease: Secondary | ICD-10-CM | POA: Diagnosis not present

## 2021-07-31 DIAGNOSIS — D509 Iron deficiency anemia, unspecified: Secondary | ICD-10-CM | POA: Diagnosis not present

## 2021-08-01 DIAGNOSIS — G4733 Obstructive sleep apnea (adult) (pediatric): Secondary | ICD-10-CM | POA: Diagnosis not present

## 2021-08-01 DIAGNOSIS — M7989 Other specified soft tissue disorders: Secondary | ICD-10-CM | POA: Diagnosis not present

## 2021-08-02 DIAGNOSIS — D631 Anemia in chronic kidney disease: Secondary | ICD-10-CM | POA: Diagnosis not present

## 2021-08-02 DIAGNOSIS — D509 Iron deficiency anemia, unspecified: Secondary | ICD-10-CM | POA: Diagnosis not present

## 2021-08-02 DIAGNOSIS — N2581 Secondary hyperparathyroidism of renal origin: Secondary | ICD-10-CM | POA: Diagnosis not present

## 2021-08-02 DIAGNOSIS — E8779 Other fluid overload: Secondary | ICD-10-CM | POA: Diagnosis not present

## 2021-08-02 DIAGNOSIS — N186 End stage renal disease: Secondary | ICD-10-CM | POA: Diagnosis not present

## 2021-08-03 ENCOUNTER — Ambulatory Visit (INDEPENDENT_AMBULATORY_CARE_PROVIDER_SITE_OTHER): Payer: Medicare Other | Admitting: Family Medicine

## 2021-08-03 ENCOUNTER — Encounter: Payer: Self-pay | Admitting: Family Medicine

## 2021-08-03 VITALS — BP 177/85 | HR 63 | Temp 97.7°F | Ht 70.0 in | Wt 201.0 lb

## 2021-08-03 DIAGNOSIS — E039 Hypothyroidism, unspecified: Secondary | ICD-10-CM

## 2021-08-03 DIAGNOSIS — Z992 Dependence on renal dialysis: Secondary | ICD-10-CM

## 2021-08-03 DIAGNOSIS — E1022 Type 1 diabetes mellitus with diabetic chronic kidney disease: Secondary | ICD-10-CM

## 2021-08-03 DIAGNOSIS — N1832 Chronic kidney disease, stage 3b: Secondary | ICD-10-CM | POA: Diagnosis not present

## 2021-08-03 DIAGNOSIS — E103551 Type 1 diabetes mellitus with stable proliferative diabetic retinopathy, right eye: Secondary | ICD-10-CM | POA: Diagnosis not present

## 2021-08-03 DIAGNOSIS — N186 End stage renal disease: Secondary | ICD-10-CM

## 2021-08-03 LAB — BAYER DCA HB A1C WAIVED: HB A1C (BAYER DCA - WAIVED): 5.3 % (ref 4.8–5.6)

## 2021-08-03 MED ORDER — TRAMADOL HCL 50 MG PO TABS: 50.0000 mg | ORAL_TABLET | Freq: Every day | ORAL | 1 refills | Status: DC | PRN

## 2021-08-03 NOTE — Progress Notes (Signed)
BP (!) 177/85   Pulse 63   Temp 97.7 F (36.5 C)   Ht $R'5\' 10"'CN$  (1.778 m)   Wt 201 lb (91.2 kg)   SpO2 99%   BMI 28.84 kg/m    Subjective:   Patient ID: Patrick Brown, male    DOB: Mar 06, 1970, 51 y.o.   MRN: [id number]  HPI: Patrick Brown is a 51 y.o. male presenting on 08/03/2021 for Medical Management of Chronic Issues (Discuss medication)   HPI Pain assessment: Cause of pain-dialysis and port site pain Pain location-left arm is the worst from dialysis port site Pain on scale of 1-10- 3 Frequency-some days, only used it infrequently less than once a month What increases pain-dialysis dialysis treatment What makes pain Better-tramadol Effects on ADL -none Any change in general medical condition-none  Current opioids rx-tramadol 50 mg # meds rx-10 Effectiveness of current meds-works well Adverse reactions from pain meds-none Morphine equivalent-2  Pill count performed-No Last drug screen -07/13/2019 ( high risk q58m, moderate risk q98m, low risk yearly ) Urine drug screen today- No Was the [place] reviewed-yes  If yes were their any concerning findings? -None  Type 1 diabetes mellitus Patient comes in today for recheck of his diabetes. Patient has been currently taking insulin pump. Patient is currently on an ACE inhibitor/ARB. Patient has not seen an ophthalmologist this year. Patient denies any issues with their feet. The symptom started onset as an adult and states renal disease on dialysis and hypertension and hyperlipidemia and retinopathy ARE RELATED TO DM   Hypertension Patient is currently on amlodipine and doxazosin and furosemide and hydralazine and metoprolol and losartan, and their blood pressure today is 177/85 but says it runs lower on dialysis days and higher on the nondialysis days.. Patient denies any lightheadedness or dizziness. Patient denies headaches, blurred vision, chest pains, shortness of breath, or weakness. Denies any side effects from medication and  is content with current medication.   Hyperlipidemia Patient is coming in for recheck of his hyperlipidemia. The patient is currently taking Crestor. They deny any issues with myalgias or history of liver damage from it. They deny any focal numbness or weakness or chest pain.   Relevant past medical, surgical, family and social history reviewed and updated as indicated. Interim medical history since our last visit reviewed. Allergies and medications reviewed and updated.  Review of Systems  Constitutional:  Negative for chills and fever.  Eyes:  Negative for visual disturbance.  Respiratory:  Negative for shortness of breath and wheezing.   Cardiovascular:  Negative for chest pain and leg swelling.  Musculoskeletal:  Negative for back pain and gait problem.  Skin:  Negative for rash.  Neurological:  Negative for dizziness, weakness and light-headedness.  All other systems reviewed and are negative.   Per HPI unless specifically indicated above   Allergies as of 08/03/2021       Reactions   Sulfa Antibiotics Other (See Comments)   High potassium   Ramipril Cough   Versed [midazolam] Other (See Comments)   "I don't wake up very good or clear it out of my system"        Medication List        Accurate as of August 03, 2021  1:46 PM. If you have any questions, ask your nurse or doctor.          STOP taking these medications    budesonide-formoterol 160-4.5 MCG/ACT inhaler Commonly known as: Symbicort Stopped by: Worthy Rancher, MD  TAKE these medications    acetaminophen 500 MG tablet Commonly known as: TYLENOL Take 1,000 mg by mouth 2 (two) times daily.   acyclovir 400 MG tablet Commonly known as: ZOVIRAX Takes mondays, wednesdays and fridays   AgaMatrix Ultra-Thin Lancets Misc TEST BS 4 TIMES A DAY AND AS NEEDED DX E10.65   albuterol (2.5 MG/3ML) 0.083% nebulizer solution Commonly known as: PROVENTIL Take 3 mLs (2.5 mg total) by nebulization  every 6 (six) hours as needed for wheezing or shortness of breath.   albuterol 108 (90 Base) MCG/ACT inhaler Commonly known as: VENTOLIN HFA Inhale 2 puffs into the lungs every 6 (six) hours as needed for wheezing or shortness of breath.   amLODipine 10 MG tablet Commonly known as: NORVASC Take 5 mg by mouth daily.   aspirin 81 MG chewable tablet Chew 81 mg by mouth every morning.   benzonatate 200 MG capsule Commonly known as: TESSALON Take 1 capsule (200 mg total) by mouth 3 (three) times daily as needed for cough.   clopidogrel 75 MG tablet Commonly known as: PLAVIX Take 40 mg by mouth daily. Take daily   cyclobenzaprine 10 MG tablet Commonly known as: FLEXERIL TAKE 1 TABLET BY MOUTH THREE TIMES A DAY AS NEEDED FOR MUSCLE SPASMS   diclofenac sodium 1 % Gel Commonly known as: VOLTAREN Apply 2 g topically 4 (four) times daily.   diphenoxylate-atropine 2.5-0.025 MG tablet Commonly known as: LOMOTIL TAKE 1 TABLET BY MOUTH 4 (FOUR) TIMES DAILY AS NEEDED FOR DIARRHEA OR LOOSE STOOLS.   Dovato 50-300 MG tablet Generic drug: dolutegravir-lamiVUDine TAKE 1 TABLET BY MOUTH DAILY   doxazosin 1 MG tablet Commonly known as: CARDURA Take 1 mg by mouth daily.   DULoxetine 60 MG capsule Commonly known as: CYMBALTA Take 1 capsule (60 mg total) by mouth daily. Take with 30 mg fot a total of $Remove'90mg'JICjjCG$    DULoxetine 30 MG capsule Commonly known as: CYMBALTA Take 1 capsule (30 mg total) by mouth daily. TAKE WITH THE $Remove'60MG'pCdAhYG$  FOR TOTAL OF $Remove'90MG'XcuetaG$    febuxostat 40 MG tablet Commonly known as: ULORIC Take 1 tablet (40 mg total) by mouth daily.   ferrous sulfate 325 (65 FE) MG tablet Take 650 mg by mouth daily with breakfast.   fluticasone 50 MCG/ACT nasal spray Commonly known as: FLONASE SPRAY 2 SPRAYS INTO EACH NOSTRIL EVERY DAY   furosemide 40 MG tablet Commonly known as: LASIX Take 40 mg by mouth daily. 40 mg nightly   GLUCOSAMINE 1500 COMPLEX PO Take 1 tablet by mouth 2 (two) times  daily.   glucosamine-chondroitin 500-400 MG tablet Take by mouth.   glucose blood test strip Commonly known as: OneTouch Verio TEST BLOOD SUGAR 4 TIMES DAILY AND AS NEEDED   hydrALAZINE 100 MG tablet Commonly known as: APRESOLINE Take by mouth.   HYDROcodone bit-homatropine 5-1.5 MG/5ML syrup Commonly known as: HYCODAN Take 5 mLs by mouth every 6 (six) hours as needed for cough.   hyoscyamine 0.125 MG tablet Commonly known as: LEVSIN TAKE 1 TABLET (0.125 MG TOTAL) BY MOUTH EVERY 4 (FOUR) HOURS AS NEEDED.   icosapent Ethyl 1 g capsule Commonly known as: VASCEPA TAKE 2 CAPSULES BY MOUTH 2 TIMES DAILY.   insulin glargine 100 UNIT/ML injection Commonly known as: LANTUS In the event of insulin pump failure, inject 8 units twice daily   insulin lispro 100 UNIT/ML injection Commonly known as: HumaLOG USE 42 UNITS TO 120 UNITS PER PUMP DAILY AS DIRECTED   ipratropium 0.03 % nasal spray Commonly  known as: ATROVENT USE 2 SPRAYS IN EACH NOSTRIL 2-3 TIMES DAILY   levocetirizine 5 MG tablet Commonly known as: XYZAL TAKE 1 TABLET BY MOUTH EVERY DAY IN THE EVENING   levothyroxine 200 MCG tablet Commonly known as: SYNTHROID Take 1 tablet (200 mcg total) by mouth daily.   levothyroxine 50 MCG tablet Commonly known as: SYNTHROID Take 1 tablet (50 mcg total) by mouth daily. Take with the 200 mcg tab.   lipase/protease/amylase 12000-38000 units Cpep capsule Commonly known as: CREON Take 2 capsules prior to meals and 1 capsule prior to snacks   LORazepam 0.5 MG tablet Commonly known as: ATIVAN Take by mouth.   losartan 100 MG tablet Commonly known as: COZAAR Take 100 mg by mouth daily.   meclizine 12.5 MG tablet Commonly known as: ANTIVERT Take 1 tablet (12.5 mg total) by mouth 3 (three) times daily as needed for dizziness.   metoCLOPramide 5 MG tablet Commonly known as: REGLAN Take 5 mg by mouth 4 (four) times daily. What changed: Another medication with the same name  was removed. Continue taking this medication, and follow the directions you see here. Changed by: Fransisca Kaufmann Kare Dado, MD   metoprolol succinate 50 MG 24 hr tablet Commonly known as: TOPROL-XL Take 50 mg by mouth 2 (two) times daily.   mirtazapine 30 MG tablet Commonly known as: REMERON Take 1 tablet (30 mg total) by mouth at bedtime.   Misc Intestinal Flora Regulat Caps Take 1 capsule by mouth every morning.   montelukast 10 MG tablet Commonly known as: SINGULAIR TAKE 1 TABLET BY MOUTH EVERYDAY AT BEDTIME   niacin 1000 MG CR tablet Commonly known as: NIASPAN TAKE 1 TABLET (1,000 MG TOTAL) BY MOUTH AT BEDTIME.   ondansetron 4 MG tablet Commonly known as: ZOFRAN TAKE 1 TABLET BY MOUTH EVERY 8 HOURS AS NEEDED FOR NAUSEA   pantoprazole 40 MG tablet Commonly known as: PROTONIX Take 40 mg by mouth 2 (two) times daily.   promethazine 25 MG suppository Commonly known as: PHENERGAN PLACE 1 SUPPOSITORY (25 MG TOTAL) RECTALLY EVERY 6 (SIX) HOURS AS NEEDED FOR NAUSEA OR VOMITING.   rosuvastatin 20 MG tablet Commonly known as: CRESTOR Take 1 tablet (20 mg total) by mouth at bedtime.   sevelamer carbonate 800 MG tablet Commonly known as: RENVELA Take 800 mg by mouth 3 (three) times daily. 2 tabs before each meal   sucralfate 1 g tablet Commonly known as: CARAFATE Take 1 tablet (1 g total) by mouth 2 (two) times daily.   tamsulosin 0.4 MG Caps capsule Commonly known as: FLOMAX TAKE 1 CAPSULE BY MOUTH EVERYDAY AT BEDTIME   Testosterone 20.25 MG/ACT (1.62%) Gel APPLY 3 PUMPS DAILY AS DIRECTED   traMADol 50 MG tablet Commonly known as: ULTRAM Take 1 tablet (50 mg total) by mouth daily as needed. Started by: Worthy Rancher, MD   traZODone 50 MG tablet Commonly known as: DESYREL Take by mouth.         Objective:   BP (!) 177/85   Pulse 63   Temp 97.7 F (36.5 C)   Ht $R'5\' 10"'ZJ$  (1.778 m)   Wt 201 lb (91.2 kg)   SpO2 99%   BMI 28.84 kg/m   Wt Readings from  Last 3 Encounters:  08/03/21 201 lb (91.2 kg)  06/08/21 208 lb (94.3 kg)  05/30/21 213 lb 6.4 oz (96.8 kg)    Physical Exam Vitals and nursing note reviewed.  Constitutional:      General: He is  not in acute distress.    Appearance: He is well-developed. He is not diaphoretic.  Eyes:     General: No scleral icterus.    Conjunctiva/sclera: Conjunctivae normal.  Neck:     Thyroid: No thyromegaly.  Cardiovascular:     Rate and Rhythm: Normal rate and regular rhythm.     Heart sounds: Normal heart sounds. No murmur heard. Pulmonary:     Effort: Pulmonary effort is normal. No respiratory distress.     Breath sounds: Normal breath sounds. No wheezing.  Musculoskeletal:        General: Swelling (trace lower extremity) present. Normal range of motion.     Cervical back: Neck supple.  Lymphadenopathy:     Cervical: No cervical adenopathy.  Skin:    General: Skin is warm and dry.     Findings: No rash.  Neurological:     Mental Status: He is alert and oriented to person, place, and time.     Coordination: Coordination normal.  Psychiatric:        Behavior: Behavior normal.       Assessment & Plan:   Problem List Items Addressed This Visit       Endocrine   Type 1 diabetes mellitus ([place]) - Primary   Relevant Orders   Lipid panel   BMP8+EGFR   Bayer DCA Hb A1c Waived   Acquired hypothyroidism   Relevant Orders   Thyroid Panel With TSH   Stable treated proliferative diabetic retinopathy of right eye determined by examination associated with type 1 diabetes mellitus ([place])     Genitourinary   ESRD (end stage renal disease) on dialysis [facility])    Patient does have elevated blood pressure today but that is most to be managed by nephrology because of his end-stage renal disease and dialysis.  A1c is good at 5.3 Follow up plan: Return in about 3 months (around 11/03/2021), or if symptoms worsen or fail to improve, for Thyroid and diabetes recheck.  Counseling provided for  all of the vaccine components Orders Placed This Encounter  Procedures   Lipid panel   BMP8+EGFR   Bayer DCA Hb A1c Waived   Thyroid Panel With TSH    Caryl Pina, MD [facility] 08/03/2021, 1:46 PM

## 2021-08-04 DIAGNOSIS — E8779 Other fluid overload: Secondary | ICD-10-CM | POA: Diagnosis not present

## 2021-08-04 DIAGNOSIS — N2581 Secondary hyperparathyroidism of renal origin: Secondary | ICD-10-CM | POA: Diagnosis not present

## 2021-08-04 DIAGNOSIS — N186 End stage renal disease: Secondary | ICD-10-CM | POA: Diagnosis not present

## 2021-08-04 DIAGNOSIS — D509 Iron deficiency anemia, unspecified: Secondary | ICD-10-CM | POA: Diagnosis not present

## 2021-08-04 DIAGNOSIS — D631 Anemia in chronic kidney disease: Secondary | ICD-10-CM | POA: Diagnosis not present

## 2021-08-06 ENCOUNTER — Encounter (INDEPENDENT_AMBULATORY_CARE_PROVIDER_SITE_OTHER): Payer: Medicare Other | Admitting: Ophthalmology

## 2021-08-06 ENCOUNTER — Ambulatory Visit (INDEPENDENT_AMBULATORY_CARE_PROVIDER_SITE_OTHER): Payer: Medicare Other | Admitting: Ophthalmology

## 2021-08-06 ENCOUNTER — Encounter (INDEPENDENT_AMBULATORY_CARE_PROVIDER_SITE_OTHER): Payer: Self-pay | Admitting: Ophthalmology

## 2021-08-06 DIAGNOSIS — H26491 Other secondary cataract, right eye: Secondary | ICD-10-CM

## 2021-08-06 DIAGNOSIS — E103511 Type 1 diabetes mellitus with proliferative diabetic retinopathy with macular edema, right eye: Secondary | ICD-10-CM

## 2021-08-06 LAB — BMP8+EGFR
BUN/Creatinine Ratio: 4 — ABNORMAL LOW (ref 9–20)
BUN: 16 mg/dL (ref 6–24)
CO2: 26 mmol/L (ref 20–29)
Calcium: 8.8 mg/dL (ref 8.7–10.2)
Chloride: 99 mmol/L (ref 96–106)
Creatinine, Ser: 4.24 mg/dL (ref 0.76–1.27)
Glucose: 202 mg/dL — ABNORMAL HIGH (ref 70–99)
Potassium: 4.4 mmol/L (ref 3.5–5.2)
Sodium: 139 mmol/L (ref 134–144)
eGFR: 16 mL/min/{1.73_m2} — ABNORMAL LOW (ref >59)

## 2021-08-06 LAB — LIPID PANEL
Chol/HDL Ratio: 1.9 ratio (ref 0.0–5.0)
Cholesterol, Total: 106 mg/dL (ref 100–199)
HDL: 56 mg/dL (ref >39)
LDL Chol Calc (NIH): 37 mg/dL (ref 0–99)
Triglycerides: 58 mg/dL (ref 0–149)
VLDL Cholesterol Cal: 13 mg/dL (ref 5–40)

## 2021-08-06 LAB — THYROID PANEL WITH TSH
Free Thyroxine Index: 2.4 (ref 1.2–4.9)
T3 Uptake Ratio: 29 % (ref 24–39)
T4, Total: 8.2 ug/dL (ref 4.5–12.0)
TSH: 14.8 u[IU]/mL — ABNORMAL HIGH (ref 0.450–4.500)

## 2021-08-06 NOTE — Assessment & Plan Note (Signed)
New lens pearls in the visual axis nasal aspect, treated today with YAG capsulotomy successfully

## 2021-08-06 NOTE — Progress Notes (Signed)
08/06/2021     CHIEF COMPLAINT Patient presents for  Chief Complaint  Patient presents with   Diabetic Retinopathy with Macular Edema      HISTORY OF PRESENT ILLNESS: Patrick Brown is a 51 y.o. male who presents to the clinic today for:   HPI   1 week for DILATE, OD, CAPSULOTOMY OD. Pt stated no changes in vision since last visit.  Last edited by Silvestre Moment on 08/06/2021  8:51 AM.      Referring physician: Dettinger, Fransisca Kaufmann, MD [place],  [place] [address]  HISTORICAL INFORMATION:   Selected notes from the MEDICAL RECORD NUMBER    Lab Results  Component Value Date   HGBA1C 5.3 08/03/2021     CURRENT MEDICATIONS: No current outpatient medications on file. (Ophthalmic Drugs)   No current facility-administered medications for this visit. (Ophthalmic Drugs)   Current Outpatient Medications (Other)  Medication Sig   acetaminophen (TYLENOL) 500 MG tablet Take 1,000 mg by mouth 2 (two) times daily.    acyclovir (ZOVIRAX) 400 MG tablet Takes mondays, wednesdays and fridays   AgaMatrix Ultra-Thin Lancets MISC TEST BS 4 TIMES A DAY AND AS NEEDED DX E10.65   albuterol (PROVENTIL) (2.5 MG/3ML) 0.083% nebulizer solution Take 3 mLs (2.5 mg total) by nebulization every 6 (six) hours as needed for wheezing or shortness of breath.   albuterol (VENTOLIN HFA) 108 (90 Base) MCG/ACT inhaler Inhale 2 puffs into the lungs every 6 (six) hours as needed for wheezing or shortness of breath.   amLODipine (NORVASC) 10 MG tablet Take 5 mg by mouth daily.   aspirin 81 MG chewable tablet Chew 81 mg by mouth every morning.   benzonatate (TESSALON) 200 MG capsule Take 1 capsule (200 mg total) by mouth 3 (three) times daily as needed for cough.   clopidogrel (PLAVIX) 75 MG tablet Take 40 mg by mouth daily. Take daily   cyclobenzaprine (FLEXERIL) 10 MG tablet TAKE 1 TABLET BY MOUTH THREE TIMES A DAY AS NEEDED FOR MUSCLE SPASMS   diclofenac sodium (VOLTAREN) 1 % GEL Apply 2 g topically 4 (four)  times daily.   diphenoxylate-atropine (LOMOTIL) 2.5-0.025 MG tablet TAKE 1 TABLET BY MOUTH 4 (FOUR) TIMES DAILY AS NEEDED FOR DIARRHEA OR LOOSE STOOLS.   DOVATO 50-300 MG tablet TAKE 1 TABLET BY MOUTH DAILY   doxazosin (CARDURA) 1 MG tablet Take 1 mg by mouth daily.   DULoxetine (CYMBALTA) 30 MG capsule Take 1 capsule (30 mg total) by mouth daily. TAKE WITH THE '60MG'$  FOR TOTAL OF '90MG'$    DULoxetine (CYMBALTA) 60 MG capsule Take 1 capsule (60 mg total) by mouth daily. Take with 30 mg fot a total of '90mg'$    febuxostat (ULORIC) 40 MG tablet Take 1 tablet (40 mg total) by mouth daily.   ferrous sulfate 325 (65 FE) MG tablet Take 650 mg by mouth daily with breakfast.    fluticasone (FLONASE) 50 MCG/ACT nasal spray SPRAY 2 SPRAYS INTO EACH NOSTRIL EVERY DAY   furosemide (LASIX) 40 MG tablet Take 40 mg by mouth daily. 40 mg nightly   Glucosamine-Chondroit-Vit C-Mn (GLUCOSAMINE 1500 COMPLEX PO) Take 1 tablet by mouth 2 (two) times daily.    glucosamine-chondroitin 500-400 MG tablet Take by mouth.    glucose blood (ONETOUCH VERIO) test strip TEST BLOOD SUGAR 4 TIMES DAILY AND AS NEEDED   hydrALAZINE (APRESOLINE) 100 MG tablet Take by mouth.   HYDROcodone bit-homatropine (HYCODAN) 5-1.5 MG/5ML syrup Take 5 mLs by mouth every 6 (six)  hours as needed for cough.   hyoscyamine (LEVSIN) 0.125 MG tablet TAKE 1 TABLET (0.125 MG TOTAL) BY MOUTH EVERY 4 (FOUR) HOURS AS NEEDED.   icosapent Ethyl (VASCEPA) 1 g capsule TAKE 2 CAPSULES BY MOUTH 2 TIMES DAILY.   insulin glargine (LANTUS) 100 UNIT/ML injection In the event of insulin pump failure, inject 8 units twice daily   insulin lispro (HUMALOG) 100 UNIT/ML injection USE 42 UNITS TO 120 UNITS PER PUMP DAILY AS DIRECTED   ipratropium (ATROVENT) 0.03 % nasal spray USE 2 SPRAYS IN EACH NOSTRIL 2-3 TIMES DAILY   levocetirizine (XYZAL) 5 MG tablet TAKE 1 TABLET BY MOUTH EVERY DAY IN THE EVENING   levothyroxine (SYNTHROID) 200 MCG tablet Take 1 tablet (200 mcg total) by  mouth daily.   levothyroxine (SYNTHROID) 50 MCG tablet Take 1 tablet (50 mcg total) by mouth daily. Take with the 200 mcg tab.   lipase/protease/amylase (CREON) 12000-38000 units CPEP capsule Take 2 capsules prior to meals and 1 capsule prior to snacks   LORazepam (ATIVAN) 0.5 MG tablet Take by mouth.   losartan (COZAAR) 100 MG tablet Take 100 mg by mouth daily.   meclizine (ANTIVERT) 12.5 MG tablet Take 1 tablet (12.5 mg total) by mouth 3 (three) times daily as needed for dizziness.   metoCLOPramide (REGLAN) 5 MG tablet Take 5 mg by mouth 4 (four) times daily.   metoprolol succinate (TOPROL-XL) 50 MG 24 hr tablet Take 50 mg by mouth 2 (two) times daily.   mirtazapine (REMERON) 30 MG tablet Take 1 tablet (30 mg total) by mouth at bedtime.   montelukast (SINGULAIR) 10 MG tablet TAKE 1 TABLET BY MOUTH EVERYDAY AT BEDTIME   niacin (NIASPAN) 1000 MG CR tablet TAKE 1 TABLET (1,000 MG TOTAL) BY MOUTH AT BEDTIME.   ondansetron (ZOFRAN) 4 MG tablet TAKE 1 TABLET BY MOUTH EVERY 8 HOURS AS NEEDED FOR NAUSEA   pantoprazole (PROTONIX) 40 MG tablet Take 40 mg by mouth 2 (two) times daily.   Probiotic Product (MISC INTESTINAL FLORA REGULAT) CAPS Take 1 capsule by mouth every morning.   promethazine (PHENERGAN) 25 MG suppository PLACE 1 SUPPOSITORY (25 MG TOTAL) RECTALLY EVERY 6 (SIX) HOURS AS NEEDED FOR NAUSEA OR VOMITING.   rosuvastatin (CRESTOR) 20 MG tablet Take 1 tablet (20 mg total) by mouth at bedtime.   sevelamer carbonate (RENVELA) 800 MG tablet Take 800 mg by mouth 3 (three) times daily. 2 tabs before each meal   sucralfate (CARAFATE) 1 g tablet Take 1 tablet (1 g total) by mouth 2 (two) times daily.   tamsulosin (FLOMAX) 0.4 MG CAPS capsule TAKE 1 CAPSULE BY MOUTH EVERYDAY AT BEDTIME   Testosterone 20.25 MG/ACT (1.62%) GEL APPLY 3 PUMPS DAILY AS DIRECTED   traMADol (ULTRAM) 50 MG tablet Take 1 tablet (50 mg total) by mouth daily as needed.   traZODone (DESYREL) 50 MG tablet Take by mouth.   No  current facility-administered medications for this visit. (Other)      REVIEW OF SYSTEMS: ROS   Negative for: Constitutional, Gastrointestinal, Neurological, Skin, Genitourinary, Musculoskeletal, HENT, Endocrine, Cardiovascular, Eyes, Respiratory, Psychiatric, Allergic/Imm, Heme/Lymph Last edited by Silvestre Moment on 08/06/2021  8:51 AM.       ALLERGIES Allergies  Allergen Reactions   Sulfa Antibiotics Other (See Comments)    High potassium   Ramipril Cough   Versed [Midazolam] Other (See Comments)    "I don't wake up very good or clear it out of my system"    PAST MEDICAL HISTORY Past  Medical History:  Diagnosis Date   Anemia, iron deficiency On procrit   CAP (community acquired pneumonia)    CKD (chronic kidney disease) stage 3, GFR 30-59 ml/min (HCC)    Degenerative arthritis    Depression    Dyslipidemia    Gastroesophageal reflux disease    Gastroparesis diabeticorum ([place])    Hematuria, microscopic 10/09   work up negative (Dr. Amalia Hailey)   HIV positive [facility])    Hyperkalemia, diminished renal excretion 06/2011 secondary to TMP/SMZ; prior secondary to  ARBS;    Known potassium excretory defect; history of recurrent hyperkalemia due to diabetic renal disease; ACE/ARB contraindicated; hyperkalemia 06/2011 secondary to TMP-SMZ   Hypothyroidism    IDDM (insulin dependent diabetes mellitus)    38 years   Low HDL (under 40)    Proteinuria    Retinopathy    x2   SIRS (systemic inflammatory response syndrome) ([place])    Past Surgical History:  Procedure Laterality Date   CATARACT EXTRACTION Right    COLONOSCOPY     ESOPHAGOGASTRODUODENOSCOPY     EYE SURGERY  2006,2001   x2    HIP ARTHROPLASTY Right 05/10/2016   Procedure: RIGHT HIP HEMIARTHROPLASTY;  Surgeon: Marchia Bond, MD;  Location: [facility];  Service: Orthopedics;  Laterality: Right;   insulin pump     LASIK Bilateral    VIDEO BRONCHOSCOPY Bilateral 12/15/2012   Procedure: VIDEO BRONCHOSCOPY WITH FLUORO;  Surgeon: Kathee Delton, MD;  Location: [facility];  Service: Cardiopulmonary;  Laterality: Bilateral;   VITRECTOMY  bilateral    FAMILY HISTORY Family History  Problem Relation Age of Onset   Diabetes type I Brother    Prostate cancer Other    Dementia Other    Dementia Other    Dementia Other     SOCIAL HISTORY Social History   Tobacco Use   Smoking status: Never   Smokeless tobacco: Never  Vaping Use   Vaping Use: Never used  Substance Use Topics   Alcohol use: No    Alcohol/week: 0.0 standard drinks of alcohol    Comment: Infrequent, less than 1 x per month.   Drug use: No         OPHTHALMIC EXAM:  Base Eye Exam     Visual Acuity (ETDRS)       Right Left   Dist cc 20/150 20/20 -1   Dist ph cc 20/80 -1     Correction: Glasses         Tonometry (Tonopen, 8:57 AM)       Right Left   Pressure 18 21         Pupils       Pupils APD   Right PERRL None   Left PERRL None         Visual Fields       Left Right    Full Full         Extraocular Movement       Right Left    Full Full         Neuro/Psych     Oriented x3: Yes   Mood/Affect: Normal         Dilation     Right eye: 2.5% Phenylephrine, 1.0% Mydriacyl @ 8:56 AM           Slit Lamp and Fundus Exam     External Exam       Right Left   External Normal Normal  Slit Lamp Exam       Right Left   Lids/Lashes Normal Normal   Conjunctiva/Sclera White and quiet White and quiet   Cornea Opacity: Central, Scar no active disease, and superior to visual axis Clear   Anterior Chamber Deep and quiet Deep and quiet   Iris Round and reactive Round and reactive   Lens Centered posterior chamber intraocular lens, Open posterior capsule, lens pearls in visual axis Centered posterior chamber intraocular lens, Open posterior capsule   Anterior Vitreous Normal Normal         Fundus Exam       Right Left   Posterior Vitreous Clear, vitrectomized Clear, vitrectomized   Disc  Normal Normal   C/D Ratio 0.3 0.3   Macula no macular thickening, Microaneurysms no macular thickening, Microaneurysms   Vessels PDR-quiet PDR-quiet   Periphery Good PRP Good PRP            IMAGING AND PROCEDURES  Imaging and Procedures for 08/06/21  Yag Capsulotomy - OD - Right Eye       Time Out Confirmed correct patient, procedure, site, and patient consented.   Anesthesia Topical anesthesia was used. Anesthesia medications included Proparacaine.   Laser Information The type of laser was yag. Total spots was 66. The energy was 1 mj. Total energy was 66 mj.   Post-op The patient tolerated the procedure well. There were no complications. The patient received written and verbal post procedure care education.   Notes Lens pearls nasal aspect of visual axis into the visual axis treated              ASSESSMENT/PLAN:  PCO (posterior capsular opacification), right New lens pearls in the visual axis nasal aspect, treated today with YAG capsulotomy successfully     ICD-10-CM   1. Stable treated proliferative diabetic retinopathy of right eye with macular edema determined by examination associated with type 1 diabetes mellitus ([place])  G40.1027 Yag Capsulotomy - OD - Right Eye   E10.3511     2. PCO (posterior capsular opacification), right  H26.491       1.  OD, lens pearls obliterated today via YAG capsulotomy for regrowth of debris in the visual axis.  No complications  2.  3.  Ophthalmic Meds Ordered this visit:  No orders of the defined types were placed in this encounter.      Return in about 9 months (around 05/08/2022) for DILATE OU, COLOR FP, OCT.  There are no Patient Instructions on file for this visit.   Explained the diagnoses, plan, and follow up with the patient and they expressed understanding.  Patient expressed understanding of the importance of proper follow up care.   Clent Demark Modestine Scherzinger M.D. Diseases & Surgery of the Retina and  Vitreous Retina & Diabetic [place] 08/06/21     Abbreviations: M myopia (nearsighted); A astigmatism; H hyperopia (farsighted); P presbyopia; Mrx spectacle prescription;  CTL contact lenses; OD right eye; OS left eye; OU both eyes  XT exotropia; ET esotropia; PEK punctate epithelial keratitis; PEE punctate epithelial erosions; DES dry eye syndrome; MGD meibomian gland dysfunction; ATs artificial tears; PFAT's preservative free artificial tears; [place] nuclear sclerotic cataract; PSC posterior subcapsular cataract; ERM epi-retinal membrane; PVD posterior vitreous detachment; RD retinal detachment; DM diabetes mellitus; DR diabetic retinopathy; NPDR non-proliferative diabetic retinopathy; PDR proliferative diabetic retinopathy; CSME clinically significant macular edema; DME diabetic macular edema; dbh dot blot hemorrhages; CWS cotton wool spot; POAG primary open angle glaucoma; C/D cup-to-disc ratio; HVF  humphrey visual field; GVF goldmann visual field; OCT optical coherence tomography; IOP intraocular pressure; BRVO Branch retinal vein occlusion; CRVO central retinal vein occlusion; CRAO central retinal artery occlusion; BRAO branch retinal artery occlusion; RT retinal tear; SB scleral buckle; PPV pars plana vitrectomy; VH Vitreous hemorrhage; PRP panretinal laser photocoagulation; IVK intravitreal kenalog; VMT vitreomacular traction; MH Macular hole;  NVD neovascularization of the disc; NVE neovascularization elsewhere; AREDS age related eye disease study; ARMD age related macular degeneration; POAG primary open angle glaucoma; EBMD epithelial/anterior basement membrane dystrophy; ACIOL anterior chamber intraocular lens; IOL intraocular lens; PCIOL posterior chamber intraocular lens; Phaco/IOL phacoemulsification with intraocular lens placement; [place] photorefractive keratectomy; LASIK laser assisted in situ keratomileusis; HTN hypertension; DM diabetes mellitus; COPD chronic obstructive pulmonary disease

## 2021-08-07 DIAGNOSIS — E8779 Other fluid overload: Secondary | ICD-10-CM | POA: Diagnosis not present

## 2021-08-07 DIAGNOSIS — D509 Iron deficiency anemia, unspecified: Secondary | ICD-10-CM | POA: Diagnosis not present

## 2021-08-07 DIAGNOSIS — D631 Anemia in chronic kidney disease: Secondary | ICD-10-CM | POA: Diagnosis not present

## 2021-08-07 DIAGNOSIS — N186 End stage renal disease: Secondary | ICD-10-CM | POA: Diagnosis not present

## 2021-08-07 DIAGNOSIS — N2581 Secondary hyperparathyroidism of renal origin: Secondary | ICD-10-CM | POA: Diagnosis not present

## 2021-08-08 ENCOUNTER — Other Ambulatory Visit: Payer: Self-pay | Admitting: Family Medicine

## 2021-08-08 MED ORDER — LEVOTHYROXINE SODIUM 75 MCG PO TABS: 75.0000 ug | ORAL_TABLET | Freq: Every day | ORAL | 3 refills | Status: DC

## 2021-08-08 NOTE — Telephone Encounter (Signed)
Last office visit 08/03/21 Last refill 02/14/21, #60, 1 refill

## 2021-08-08 NOTE — Addendum Note (Signed)
Addended bySigurd Sos on: 08/08/2021 09:36 AM   Modules accepted: Orders

## 2021-08-09 ENCOUNTER — Other Ambulatory Visit: Payer: Self-pay | Admitting: Family Medicine

## 2021-08-09 DIAGNOSIS — D631 Anemia in chronic kidney disease: Secondary | ICD-10-CM | POA: Diagnosis not present

## 2021-08-09 DIAGNOSIS — N2581 Secondary hyperparathyroidism of renal origin: Secondary | ICD-10-CM | POA: Diagnosis not present

## 2021-08-09 DIAGNOSIS — E119 Type 2 diabetes mellitus without complications: Secondary | ICD-10-CM | POA: Diagnosis not present

## 2021-08-09 DIAGNOSIS — D509 Iron deficiency anemia, unspecified: Secondary | ICD-10-CM | POA: Diagnosis not present

## 2021-08-09 DIAGNOSIS — E8779 Other fluid overload: Secondary | ICD-10-CM | POA: Diagnosis not present

## 2021-08-09 DIAGNOSIS — N186 End stage renal disease: Secondary | ICD-10-CM | POA: Diagnosis not present

## 2021-08-11 DIAGNOSIS — N186 End stage renal disease: Secondary | ICD-10-CM | POA: Diagnosis not present

## 2021-08-11 DIAGNOSIS — E8779 Other fluid overload: Secondary | ICD-10-CM | POA: Diagnosis not present

## 2021-08-11 DIAGNOSIS — D509 Iron deficiency anemia, unspecified: Secondary | ICD-10-CM | POA: Diagnosis not present

## 2021-08-11 DIAGNOSIS — N2581 Secondary hyperparathyroidism of renal origin: Secondary | ICD-10-CM | POA: Diagnosis not present

## 2021-08-11 DIAGNOSIS — D631 Anemia in chronic kidney disease: Secondary | ICD-10-CM | POA: Diagnosis not present

## 2021-08-14 DIAGNOSIS — N2581 Secondary hyperparathyroidism of renal origin: Secondary | ICD-10-CM | POA: Diagnosis not present

## 2021-08-14 DIAGNOSIS — N186 End stage renal disease: Secondary | ICD-10-CM | POA: Diagnosis not present

## 2021-08-14 DIAGNOSIS — E8779 Other fluid overload: Secondary | ICD-10-CM | POA: Diagnosis not present

## 2021-08-14 DIAGNOSIS — D509 Iron deficiency anemia, unspecified: Secondary | ICD-10-CM | POA: Diagnosis not present

## 2021-08-14 DIAGNOSIS — D631 Anemia in chronic kidney disease: Secondary | ICD-10-CM | POA: Diagnosis not present

## 2021-08-15 DIAGNOSIS — R112 Nausea with vomiting, unspecified: Secondary | ICD-10-CM | POA: Diagnosis not present

## 2021-08-15 DIAGNOSIS — R110 Nausea: Secondary | ICD-10-CM | POA: Diagnosis not present

## 2021-08-16 DIAGNOSIS — N2581 Secondary hyperparathyroidism of renal origin: Secondary | ICD-10-CM | POA: Diagnosis not present

## 2021-08-16 DIAGNOSIS — D631 Anemia in chronic kidney disease: Secondary | ICD-10-CM | POA: Diagnosis not present

## 2021-08-16 DIAGNOSIS — N186 End stage renal disease: Secondary | ICD-10-CM | POA: Diagnosis not present

## 2021-08-16 DIAGNOSIS — E8779 Other fluid overload: Secondary | ICD-10-CM | POA: Diagnosis not present

## 2021-08-16 DIAGNOSIS — D509 Iron deficiency anemia, unspecified: Secondary | ICD-10-CM | POA: Diagnosis not present

## 2021-08-18 DIAGNOSIS — E8779 Other fluid overload: Secondary | ICD-10-CM | POA: Diagnosis not present

## 2021-08-18 DIAGNOSIS — D631 Anemia in chronic kidney disease: Secondary | ICD-10-CM | POA: Diagnosis not present

## 2021-08-18 DIAGNOSIS — N2581 Secondary hyperparathyroidism of renal origin: Secondary | ICD-10-CM | POA: Diagnosis not present

## 2021-08-18 DIAGNOSIS — N186 End stage renal disease: Secondary | ICD-10-CM | POA: Diagnosis not present

## 2021-08-18 DIAGNOSIS — D509 Iron deficiency anemia, unspecified: Secondary | ICD-10-CM | POA: Diagnosis not present

## 2021-08-21 DIAGNOSIS — N2581 Secondary hyperparathyroidism of renal origin: Secondary | ICD-10-CM | POA: Diagnosis not present

## 2021-08-21 DIAGNOSIS — D509 Iron deficiency anemia, unspecified: Secondary | ICD-10-CM | POA: Diagnosis not present

## 2021-08-21 DIAGNOSIS — E8779 Other fluid overload: Secondary | ICD-10-CM | POA: Diagnosis not present

## 2021-08-21 DIAGNOSIS — D631 Anemia in chronic kidney disease: Secondary | ICD-10-CM | POA: Diagnosis not present

## 2021-08-21 DIAGNOSIS — N186 End stage renal disease: Secondary | ICD-10-CM | POA: Diagnosis not present

## 2021-08-22 ENCOUNTER — Ambulatory Visit (INDEPENDENT_AMBULATORY_CARE_PROVIDER_SITE_OTHER): Payer: Medicare Other | Admitting: Family Medicine

## 2021-08-22 ENCOUNTER — Encounter: Payer: Self-pay | Admitting: Family Medicine

## 2021-08-22 VITALS — BP 166/76 | HR 59 | Temp 98.0°F | Ht 70.0 in | Wt 202.0 lb

## 2021-08-22 DIAGNOSIS — E1022 Type 1 diabetes mellitus with diabetic chronic kidney disease: Secondary | ICD-10-CM

## 2021-08-22 DIAGNOSIS — Z992 Dependence on renal dialysis: Secondary | ICD-10-CM | POA: Diagnosis not present

## 2021-08-22 DIAGNOSIS — N1832 Chronic kidney disease, stage 3b: Secondary | ICD-10-CM

## 2021-08-22 DIAGNOSIS — E039 Hypothyroidism, unspecified: Secondary | ICD-10-CM | POA: Diagnosis not present

## 2021-08-22 DIAGNOSIS — Z79891 Long term (current) use of opiate analgesic: Secondary | ICD-10-CM | POA: Diagnosis not present

## 2021-08-22 DIAGNOSIS — E103552 Type 1 diabetes mellitus with stable proliferative diabetic retinopathy, left eye: Secondary | ICD-10-CM

## 2021-08-22 DIAGNOSIS — E103551 Type 1 diabetes mellitus with stable proliferative diabetic retinopathy, right eye: Secondary | ICD-10-CM | POA: Diagnosis not present

## 2021-08-22 DIAGNOSIS — N186 End stage renal disease: Secondary | ICD-10-CM

## 2021-08-22 DIAGNOSIS — R053 Chronic cough: Secondary | ICD-10-CM

## 2021-08-22 MED ORDER — TRAMADOL HCL 50 MG PO TABS: 50.0000 mg | ORAL_TABLET | Freq: Every day | ORAL | 2 refills | Status: AC | PRN

## 2021-08-22 MED ORDER — IPRATROPIUM BROMIDE 0.03 % NA SOLN
NASAL | 3 refills | Status: DC
Start: 2021-08-22 — End: 2021-11-19

## 2021-08-22 MED ORDER — BENZONATATE 200 MG PO CAPS: 200.0000 mg | ORAL_CAPSULE | Freq: Three times a day (TID) | ORAL | 2 refills | Status: AC | PRN

## 2021-08-22 MED ORDER — ONDANSETRON HCL 4 MG PO TABS: 4.0000 mg | ORAL_TABLET | Freq: Three times a day (TID) | ORAL | 6 refills | Status: AC | PRN

## 2021-08-22 MED ORDER — CYCLOBENZAPRINE HCL 10 MG PO TABS: 10.0000 mg | ORAL_TABLET | Freq: Three times a day (TID) | ORAL | 3 refills | Status: AC | PRN

## 2021-08-22 MED ORDER — LEVOCETIRIZINE DIHYDROCHLORIDE 5 MG PO TABS: ORAL_TABLET | ORAL | 3 refills | Status: AC

## 2021-08-22 NOTE — Addendum Note (Signed)
Addended by: Caryl Pina on: 08/22/2021 01:55 PM   Modules accepted: Orders

## 2021-08-22 NOTE — Progress Notes (Signed)
BP (!) 166/76   Pulse (!) 59   Temp 98 F (36.7 C)   Ht '5\' 10"'$  (1.778 m)   Wt 202 lb (91.6 kg)   SpO2 97%   BMI 28.98 kg/m    Subjective:   Patient ID: Patrick Brown, male    DOB: 1970/08/27, 51 y.o.   MRN: [id number]  HPI: [name] is a 51 y.o. male presenting on 08/22/2021 for Medical Management of Chronic Issues, Hypothyroidism, and Diabetes   HPI Type 1 diabetes mellitus Patient comes in today for recheck of his diabetes. Patient has been currently taking lantus and humalog. Patient is not currently on an ACE inhibitor/ARB. Patient has seen an ophthalmologist this year. Patient denies any issues with their feet. The symptom started onset as an adult hypothyroidism and end-stage renal disease and sleep apnea and gastroparesis ARE RELATED TO DM   Hypothyroidism recheck Patient is coming in for thyroid recheck today as well. They deny any issues with hair changes or heat or cold problems or diarrhea or constipation. They deny any chest pain or palpitations. They are currently on levothyroxine 275 micrograms.  He still feels tired and cold.  Pain assessment: Cause of pain-pain from dialysis site and chronic back pain Pain location-lower back and arm from dialysis Pain on scale of 1-10- 5 Frequency-Daily What increases pain-prolonged sitting and especially related to dialysis What makes pain Better-tramadol Effects on ADL -less and less mobile because of pain and arthritis and chronic illness Any change in general medical condition-still on dialysis transplant list for future  Current opioids rx-tramadol 50 mg daily as needed # meds rx-10/month Effectiveness of current meds-works well when he needs it Adverse reactions from pain meds-none Morphine equivalent-5  Pill count performed-No Last drug screen -07/13/2019 ( high risk q85m moderate risk q668mlow risk yearly ) Urine drug screen today- Yes [place]  If yes were their any concerning findings?  -None  Pain contract signed on: Today  Relevant past medical, surgical, family and social history reviewed and updated as indicated. Interim medical history since our last visit reviewed. Allergies and medications reviewed and updated.  Review of Systems  Constitutional:  Negative for chills and fever.  Eyes:  Negative for visual disturbance.  Respiratory:  Negative for shortness of breath and wheezing.   Cardiovascular:  Negative for chest pain and leg swelling.  Musculoskeletal:  Negative for arthralgias, back pain and gait problem.  Skin:  Negative for rash.  Neurological:  Negative for dizziness, weakness and light-headedness.  Psychiatric/Behavioral:  Negative for dysphoric mood, self-injury, sleep disturbance and suicidal ideas. The patient is not nervous/anxious.   All other systems reviewed and are negative.   Per HPI unless specifically indicated above   Allergies as of 08/22/2021       Reactions   Sulfa Antibiotics Other (See Comments)   High potassium   Ramipril Cough   Versed [midazolam] Other (See Comments)   "I don't wake up very good or clear it out of my system"        Medication List        Accurate as of August 22, 2021 10:10 AM. If you have any questions, ask your nurse or doctor.          acetaminophen 500 MG tablet Commonly known as: TYLENOL Take 1,000 mg by mouth 2 (two) times daily.   acyclovir 400 MG tablet Commonly known as: ZOVIRAX Takes mondays, [id number] fridays   AgaMatrix Ultra-Thin Lancets Misc TEST  BS 4 TIMES A DAY AND AS NEEDED DX E10.65   albuterol (2.5 MG/3ML) 0.083% nebulizer solution Commonly known as: PROVENTIL Take 3 mLs (2.5 mg total) by nebulization every 6 (six) hours as needed for wheezing or shortness of breath.   albuterol 108 (90 Base) MCG/ACT inhaler Commonly known as: VENTOLIN HFA Inhale 2 puffs into the lungs every 6 (six) hours as needed for wheezing or shortness of breath.   amLODipine 10 MG  tablet Commonly known as: NORVASC Take 5 mg by mouth daily.   aspirin 81 MG chewable tablet Chew 81 mg by mouth every morning.   benzonatate 200 MG capsule Commonly known as: TESSALON Take 1 capsule (200 mg total) by mouth 3 (three) times daily as needed for cough.   clopidogrel 75 MG tablet Commonly known as: PLAVIX Take 40 mg by mouth daily. Take daily   cyclobenzaprine 10 MG tablet Commonly known as: FLEXERIL TAKE 1 TABLET BY MOUTH THREE TIMES A DAY AS NEEDED FOR MUSCLE SPASMS   diclofenac sodium 1 % Gel Commonly known as: VOLTAREN Apply 2 g topically 4 (four) times daily.   diphenoxylate-atropine 2.5-0.025 MG tablet Commonly known as: LOMOTIL TAKE 1 TABLET BY MOUTH 4 (FOUR) TIMES DAILY AS NEEDED FOR DIARRHEA OR LOOSE STOOLS.   Dovato 50-300 MG tablet Generic drug: dolutegravir-lamiVUDine TAKE 1 TABLET BY MOUTH DAILY   doxazosin 1 MG tablet Commonly known as: CARDURA Take 1 mg by mouth daily.   DULoxetine 60 MG capsule Commonly known as: CYMBALTA Take 1 capsule (60 mg total) by mouth daily. Take with 30 mg fot a total of '90mg'$    DULoxetine 30 MG capsule Commonly known as: CYMBALTA Take 1 capsule (30 mg total) by mouth daily. TAKE WITH THE '60MG'$  FOR TOTAL OF '90MG'$    febuxostat 40 MG tablet Commonly known as: ULORIC Take 1 tablet (40 mg total) by mouth daily.   ferrous sulfate 325 (65 FE) MG tablet Take 650 mg by mouth daily with breakfast.   fluticasone 50 MCG/ACT nasal spray Commonly known as: FLONASE SPRAY 2 SPRAYS INTO EACH NOSTRIL EVERY DAY   furosemide 40 MG tablet Commonly known as: LASIX Take 40 mg by mouth daily. 40 mg nightly   GLUCOSAMINE 1500 COMPLEX PO Take 1 tablet by mouth 2 (two) times daily.   glucosamine-chondroitin 500-400 MG tablet Take by mouth.   glucose blood test strip Commonly known as: OneTouch Verio TEST BLOOD SUGAR 4 TIMES DAILY AND AS NEEDED   hydrALAZINE 100 MG tablet Commonly known as: APRESOLINE Take by mouth.    HYDROcodone bit-homatropine 5-1.5 MG/5ML syrup Commonly known as: HYCODAN Take 5 mLs by mouth every 6 (six) hours as needed for cough.   hyoscyamine 0.125 MG tablet Commonly known as: LEVSIN TAKE 1 TABLET (0.125 MG TOTAL) BY MOUTH EVERY 4 (FOUR) HOURS AS NEEDED.   icosapent Ethyl 1 g capsule Commonly known as: VASCEPA TAKE 2 CAPSULES BY MOUTH TWICE A DAY   insulin glargine 100 UNIT/ML injection Commonly known as: LANTUS In the event of insulin pump failure, inject 8 units twice daily   insulin lispro 100 UNIT/ML injection Commonly known as: HumaLOG USE 42 UNITS TO 120 UNITS PER PUMP DAILY AS DIRECTED   ipratropium 0.03 % nasal spray Commonly known as: ATROVENT USE 2 SPRAYS IN EACH NOSTRIL 2-3 TIMES DAILY   levocetirizine 5 MG tablet Commonly known as: XYZAL TAKE 1 TABLET BY MOUTH EVERY DAY IN THE EVENING What changed:  when to take this additional instructions Changed by: Fransisca Kaufmann  Ludwika Rodd, MD   levothyroxine 200 MCG tablet Commonly known as: SYNTHROID Take 1 tablet (200 mcg total) by mouth daily.   levothyroxine 75 MCG tablet Commonly known as: SYNTHROID Take 1 tablet (75 mcg total) by mouth daily.   lipase/protease/amylase 12000-38000 units Cpep capsule Commonly known as: CREON Take 2 capsules prior to meals and 1 capsule prior to snacks   LORazepam 0.5 MG tablet Commonly known as: ATIVAN Take by mouth.   losartan 100 MG tablet Commonly known as: COZAAR Take 100 mg by mouth daily.   meclizine 12.5 MG tablet Commonly known as: ANTIVERT Take 1 tablet (12.5 mg total) by mouth 3 (three) times daily as needed for dizziness.   metoCLOPramide 5 MG tablet Commonly known as: REGLAN Take 5 mg by mouth 4 (four) times daily.   metoprolol succinate 50 MG 24 hr tablet Commonly known as: TOPROL-XL Take 50 mg by mouth 2 (two) times daily.   mirtazapine 30 MG tablet Commonly known as: REMERON Take 1 tablet (30 mg total) by mouth at bedtime.   Misc Intestinal  Flora Regulat Caps Take 1 capsule by mouth every morning.   montelukast 10 MG tablet Commonly known as: SINGULAIR TAKE 1 TABLET BY MOUTH EVERYDAY AT BEDTIME   niacin 1000 MG CR tablet Commonly known as: NIASPAN TAKE 1 TABLET (1,000 MG TOTAL) BY MOUTH AT BEDTIME.   ondansetron 4 MG tablet Commonly known as: ZOFRAN Take 1 tablet (4 mg total) by mouth every 8 (eight) hours as needed. for nausea   pantoprazole 40 MG tablet Commonly known as: PROTONIX Take 40 mg by mouth 2 (two) times daily.   promethazine 25 MG suppository Commonly known as: PHENERGAN PLACE 1 SUPPOSITORY (25 MG TOTAL) RECTALLY EVERY 6 (SIX) HOURS AS NEEDED FOR NAUSEA OR VOMITING.   rosuvastatin 20 MG tablet Commonly known as: CRESTOR Take 1 tablet (20 mg total) by mouth at bedtime.   sevelamer carbonate 800 MG tablet Commonly known as: RENVELA Take 800 mg by mouth 3 (three) times daily. 2 tabs before each meal   sucralfate 1 g tablet Commonly known as: CARAFATE Take 1 tablet (1 g total) by mouth 2 (two) times daily.   tamsulosin 0.4 MG Caps capsule Commonly known as: FLOMAX TAKE 1 CAPSULE BY MOUTH EVERYDAY AT BEDTIME   Testosterone 20.25 MG/ACT (1.62%) Gel APPLY 3 PUMPS DAILY AS DIRECTED   traMADol 50 MG tablet Commonly known as: ULTRAM Take 1 tablet (50 mg total) by mouth daily as needed.   traZODone 50 MG tablet Commonly known as: DESYREL Take by mouth.         Objective:   BP (!) 166/76   Pulse (!) 59   Temp 98 F (36.7 C)   Ht '5\' 10"'$  (1.778 m)   Wt 202 lb (91.6 kg)   SpO2 97%   BMI 28.98 kg/m   Wt Readings from Last 3 Encounters:  08/22/21 202 lb (91.6 kg)  08/03/21 201 lb (91.2 kg)  06/08/21 208 lb (94.3 kg)    Physical Exam Vitals and nursing note reviewed.  Constitutional:      General: He is not in acute distress.    Appearance: He is well-developed. He is not diaphoretic.  Eyes:     General: No scleral icterus.    Conjunctiva/sclera: Conjunctivae normal.  Neck:      Thyroid: No thyromegaly.  Cardiovascular:     Rate and Rhythm: Normal rate and regular rhythm.     Heart sounds: Normal heart sounds. No murmur heard.  Pulmonary:     Effort: Pulmonary effort is normal. No respiratory distress.     Breath sounds: Normal breath sounds. No wheezing.  Musculoskeletal:        General: Swelling (Peripheral edema both lower extremities, trace and trace in right upper extremity as well) present.     Cervical back: Neck supple.  Lymphadenopathy:     Cervical: No cervical adenopathy.  Skin:    General: Skin is warm and dry.  Neurological:     Mental Status: He is alert and oriented to person, place, and time.     Coordination: Coordination normal.  Psychiatric:        Behavior: Behavior normal.       Assessment & Plan:   Problem List Items Addressed This Visit       Endocrine   Type 1 diabetes mellitus ([place])   Relevant Orders   Thyroid Panel With TSH   Lipid panel   Drug Screen 10 W/Conf, Se   Acquired hypothyroidism - Primary   Relevant Orders   Thyroid Panel With TSH   Stable treated proliferative diabetic retinopathy of right eye determined by examination associated with type 1 diabetes mellitus ([place])   Stable treated proliferative diabetic retinopathy of left eye without macular edema determined by examination associated with type 1 diabetes mellitus ([place])     Genitourinary   ESRD (end stage renal disease) on dialysis ([facility])   Relevant Orders   Thyroid Panel With TSH   Lipid panel   Drug Screen 10 W/Conf, Se   Other Visit Diagnoses     Persistent cough       Relevant Medications   levocetirizine (XYZAL) 5 MG tablet       We will recheck thyroid levels, see where they are and then we will discuss changes, currently taking 275 mg.  We will also check a cholesterol panel.  He continues to follow blood pressure with use nephrology and dialysis team.   Follow up plan: Return in about 2 months (around 10/23/2021), or if symptoms worsen  or fail to improve, for Diabetes and thyroid recheck.  Counseling provided for all of the vaccine components Orders Placed This Encounter  Procedures   Thyroid Panel With TSH   Lipid panel   Drug Screen 10 W/Conf, Se    Caryl Pina, MD [facility] 08/22/2021, 10:10 AM

## 2021-08-23 DIAGNOSIS — N186 End stage renal disease: Secondary | ICD-10-CM | POA: Diagnosis not present

## 2021-08-23 DIAGNOSIS — D631 Anemia in chronic kidney disease: Secondary | ICD-10-CM | POA: Diagnosis not present

## 2021-08-23 DIAGNOSIS — D509 Iron deficiency anemia, unspecified: Secondary | ICD-10-CM | POA: Diagnosis not present

## 2021-08-23 DIAGNOSIS — E8779 Other fluid overload: Secondary | ICD-10-CM | POA: Diagnosis not present

## 2021-08-23 DIAGNOSIS — N2581 Secondary hyperparathyroidism of renal origin: Secondary | ICD-10-CM | POA: Diagnosis not present

## 2021-08-25 DIAGNOSIS — N186 End stage renal disease: Secondary | ICD-10-CM | POA: Diagnosis not present

## 2021-08-25 DIAGNOSIS — D631 Anemia in chronic kidney disease: Secondary | ICD-10-CM | POA: Diagnosis not present

## 2021-08-25 DIAGNOSIS — N2581 Secondary hyperparathyroidism of renal origin: Secondary | ICD-10-CM | POA: Diagnosis not present

## 2021-08-25 DIAGNOSIS — E8779 Other fluid overload: Secondary | ICD-10-CM | POA: Diagnosis not present

## 2021-08-25 DIAGNOSIS — D509 Iron deficiency anemia, unspecified: Secondary | ICD-10-CM | POA: Diagnosis not present

## 2021-08-27 ENCOUNTER — Other Ambulatory Visit: Payer: Self-pay | Admitting: Family Medicine

## 2021-08-27 DIAGNOSIS — N186 End stage renal disease: Secondary | ICD-10-CM | POA: Diagnosis not present

## 2021-08-27 DIAGNOSIS — K8689 Other specified diseases of pancreas: Secondary | ICD-10-CM | POA: Diagnosis not present

## 2021-08-27 DIAGNOSIS — K3184 Gastroparesis: Secondary | ICD-10-CM | POA: Diagnosis not present

## 2021-08-27 DIAGNOSIS — K219 Gastro-esophageal reflux disease without esophagitis: Secondary | ICD-10-CM | POA: Diagnosis not present

## 2021-08-28 DIAGNOSIS — N19 Unspecified kidney failure: Secondary | ICD-10-CM | POA: Diagnosis not present

## 2021-08-28 DIAGNOSIS — Y92008 Other place in unspecified non-institutional (private) residence as the place of occurrence of the external cause: Secondary | ICD-10-CM | POA: Diagnosis not present

## 2021-08-28 DIAGNOSIS — M79661 Pain in right lower leg: Secondary | ICD-10-CM | POA: Diagnosis not present

## 2021-08-28 DIAGNOSIS — S8011XA Contusion of right lower leg, initial encounter: Secondary | ICD-10-CM | POA: Diagnosis not present

## 2021-08-28 DIAGNOSIS — Y939 Activity, unspecified: Secondary | ICD-10-CM | POA: Diagnosis not present

## 2021-08-28 DIAGNOSIS — M25561 Pain in right knee: Secondary | ICD-10-CM | POA: Diagnosis not present

## 2021-08-28 DIAGNOSIS — W1789XA Other fall from one level to another, initial encounter: Secondary | ICD-10-CM | POA: Diagnosis not present

## 2021-08-28 DIAGNOSIS — S8991XA Unspecified injury of right lower leg, initial encounter: Secondary | ICD-10-CM | POA: Diagnosis not present

## 2021-08-28 DIAGNOSIS — S8001XA Contusion of right knee, initial encounter: Secondary | ICD-10-CM | POA: Diagnosis not present

## 2021-08-29 MED ORDER — LEVOTHYROXINE SODIUM 300 MCG PO TABS: 300.0000 ug | ORAL_TABLET | Freq: Every day | ORAL | 1 refills | Status: AC

## 2021-08-29 NOTE — Addendum Note (Signed)
Addended by: Alphonzo Dublin on: 08/29/2021 08:14 AM   Modules accepted: Orders

## 2021-08-30 ENCOUNTER — Other Ambulatory Visit: Payer: Self-pay | Admitting: Family Medicine

## 2021-08-30 DIAGNOSIS — D631 Anemia in chronic kidney disease: Secondary | ICD-10-CM | POA: Diagnosis not present

## 2021-08-30 DIAGNOSIS — N186 End stage renal disease: Secondary | ICD-10-CM | POA: Diagnosis not present

## 2021-08-30 DIAGNOSIS — D509 Iron deficiency anemia, unspecified: Secondary | ICD-10-CM | POA: Diagnosis not present

## 2021-08-30 DIAGNOSIS — E8779 Other fluid overload: Secondary | ICD-10-CM | POA: Diagnosis not present

## 2021-08-30 DIAGNOSIS — N2581 Secondary hyperparathyroidism of renal origin: Secondary | ICD-10-CM | POA: Diagnosis not present

## 2021-09-01 DIAGNOSIS — E8779 Other fluid overload: Secondary | ICD-10-CM | POA: Diagnosis not present

## 2021-09-01 DIAGNOSIS — D631 Anemia in chronic kidney disease: Secondary | ICD-10-CM | POA: Diagnosis not present

## 2021-09-01 DIAGNOSIS — N2581 Secondary hyperparathyroidism of renal origin: Secondary | ICD-10-CM | POA: Diagnosis not present

## 2021-09-01 DIAGNOSIS — N186 End stage renal disease: Secondary | ICD-10-CM | POA: Diagnosis not present

## 2021-09-01 DIAGNOSIS — D509 Iron deficiency anemia, unspecified: Secondary | ICD-10-CM | POA: Diagnosis not present

## 2021-09-02 ENCOUNTER — Other Ambulatory Visit: Payer: Self-pay | Admitting: Family Medicine

## 2021-09-03 DIAGNOSIS — N186 End stage renal disease: Secondary | ICD-10-CM | POA: Diagnosis not present

## 2021-09-03 DIAGNOSIS — Z992 Dependence on renal dialysis: Secondary | ICD-10-CM | POA: Diagnosis not present

## 2021-09-03 LAB — OPIATES,MS,WB/SP RFX
6-Acetylmorphine: NEGATIVE
Codeine: NEGATIVE ng/mL
Dihydrocodeine: NEGATIVE ng/mL
Hydrocodone: 6 ng/mL
Hydromorphone: NEGATIVE ng/mL
Morphine: NEGATIVE ng/mL
Opiate Confirmation: POSITIVE

## 2021-09-03 LAB — DRUG SCREEN 10 W/CONF, SERUM
Amphetamines, IA: NEGATIVE ng/mL
Barbiturates, IA: NEGATIVE ug/mL
Benzodiazepines, IA: NEGATIVE ng/mL
Cocaine & Metabolite, IA: NEGATIVE ng/mL
Methadone, IA: NEGATIVE ng/mL
Opiates, IA: POSITIVE ng/mL — AB
Oxycodones, IA: NEGATIVE ng/mL
Phencyclidine, IA: NEGATIVE ng/mL
Propoxyphene, IA: NEGATIVE ng/mL
THC(Marijuana) Metabolite, IA: NEGATIVE ng/mL

## 2021-09-03 LAB — THYROID PANEL WITH TSH
Free Thyroxine Index: 2.8 (ref 1.2–4.9)
T3 Uptake Ratio: 30 % (ref 24–39)
T4, Total: 9.3 ug/dL (ref 4.5–12.0)
TSH: 20 u[IU]/mL — ABNORMAL HIGH (ref 0.450–4.500)

## 2021-09-03 LAB — LIPID PANEL
Chol/HDL Ratio: 1.6 ratio (ref 0.0–5.0)
Cholesterol, Total: 98 mg/dL — ABNORMAL LOW (ref 100–199)
HDL: 61 mg/dL (ref >39)
LDL Chol Calc (NIH): 24 mg/dL (ref 0–99)
Triglycerides: 55 mg/dL (ref 0–149)
VLDL Cholesterol Cal: 13 mg/dL (ref 5–40)

## 2021-09-03 LAB — OXYCODONES,MS,WB/SP RFX
Oxycocone: NEGATIVE ng/mL
Oxycodones Confirmation: NEGATIVE
Oxymorphone: NEGATIVE ng/mL

## 2021-09-04 DIAGNOSIS — D509 Iron deficiency anemia, unspecified: Secondary | ICD-10-CM | POA: Diagnosis not present

## 2021-09-04 DIAGNOSIS — E8779 Other fluid overload: Secondary | ICD-10-CM | POA: Diagnosis not present

## 2021-09-04 DIAGNOSIS — N2581 Secondary hyperparathyroidism of renal origin: Secondary | ICD-10-CM | POA: Diagnosis not present

## 2021-09-04 DIAGNOSIS — D631 Anemia in chronic kidney disease: Secondary | ICD-10-CM | POA: Diagnosis not present

## 2021-09-04 DIAGNOSIS — E119 Type 2 diabetes mellitus without complications: Secondary | ICD-10-CM | POA: Diagnosis not present

## 2021-09-04 DIAGNOSIS — N186 End stage renal disease: Secondary | ICD-10-CM | POA: Diagnosis not present

## 2021-09-06 DIAGNOSIS — E119 Type 2 diabetes mellitus without complications: Secondary | ICD-10-CM | POA: Diagnosis not present

## 2021-09-06 DIAGNOSIS — N186 End stage renal disease: Secondary | ICD-10-CM | POA: Diagnosis not present

## 2021-09-06 DIAGNOSIS — D631 Anemia in chronic kidney disease: Secondary | ICD-10-CM | POA: Diagnosis not present

## 2021-09-06 DIAGNOSIS — N2581 Secondary hyperparathyroidism of renal origin: Secondary | ICD-10-CM | POA: Diagnosis not present

## 2021-09-06 DIAGNOSIS — D509 Iron deficiency anemia, unspecified: Secondary | ICD-10-CM | POA: Diagnosis not present

## 2021-09-06 DIAGNOSIS — E8779 Other fluid overload: Secondary | ICD-10-CM | POA: Diagnosis not present

## 2021-09-07 ENCOUNTER — Other Ambulatory Visit: Payer: Self-pay | Admitting: Family Medicine

## 2021-09-07 ENCOUNTER — Encounter: Payer: Self-pay | Admitting: Family Medicine

## 2021-09-07 ENCOUNTER — Ambulatory Visit (INDEPENDENT_AMBULATORY_CARE_PROVIDER_SITE_OTHER): Payer: Medicare Other | Admitting: Family Medicine

## 2021-09-07 VITALS — BP 141/69 | HR 71 | Temp 98.0°F | Ht 70.0 in

## 2021-09-07 DIAGNOSIS — M25561 Pain in right knee: Secondary | ICD-10-CM

## 2021-09-07 DIAGNOSIS — R296 Repeated falls: Secondary | ICD-10-CM | POA: Diagnosis not present

## 2021-09-07 NOTE — Progress Notes (Signed)
BP (!) 141/69   Pulse 71   Temp 98 F (36.7 C)   Ht '5\' 10"'$  (1.778 m)   SpO2 99%   BMI 28.98 kg/m    Subjective:   Patient ID: Patrick Brown, male    DOB: 1971-01-27, 51 y.o.   MRN: [id number]  HPI: Patrick Brown is a 51 y.o. male presenting on 09/07/2021 for Knee Pain (And swelling-right. Recent fall)   HPI Fall and swelling Patient comes in today for recent fall and swelling.  He was seen in the emergency department.  Fall happened about a week and a half ago on the 24th of last month.  He says he fell backwards and ended up wedged between his car and the side of his steps and they did have to call EMS to help get him out of there.  He has bruises on his elbows and his knees and some on his legs.  He says really the only thing that is hurting him today is his right knee and right lower leg.  Says it hurts to walk on.  They did give him some hydrocodone from the emergency department.  He wants to do some physical therapy to see if he can get his strength up.  He has been having more falls recently  Relevant past medical, surgical, family and social history reviewed and updated as indicated. Interim medical history since our last visit reviewed. Allergies and medications reviewed and updated.  Review of Systems  Constitutional:  Negative for chills and fever.  Eyes:  Negative for visual disturbance.  Respiratory:  Negative for shortness of breath and wheezing.   Cardiovascular:  Negative for chest pain and leg swelling.  Musculoskeletal:  Positive for arthralgias, joint swelling and myalgias. Negative for back pain and gait problem.  Skin:  Negative for rash.  Neurological:  Positive for weakness. Negative for dizziness and light-headedness.  All other systems reviewed and are negative.   Per HPI unless specifically indicated above   Allergies as of 09/07/2021       Reactions   Sulfa Antibiotics Other (See Comments)   High potassium   Ramipril Cough   Versed [midazolam] Other  (See Comments)   "I don't wake up very good or clear it out of my system"        Medication List        Accurate as of September 07, 2021  9:23 AM. If you have any questions, ask your nurse or doctor.          acetaminophen 500 MG tablet Commonly known as: TYLENOL Take 1,000 mg by mouth 2 (two) times daily.   acyclovir 400 MG tablet Commonly known as: ZOVIRAX Takes mondays, wednesdays and fridays   AgaMatrix Ultra-Thin Lancets Misc TEST BS 4 TIMES A DAY AND AS NEEDED DX E10.65   albuterol (2.5 MG/3ML) 0.083% nebulizer solution Commonly known as: PROVENTIL Take 3 mLs (2.5 mg total) by nebulization every 6 (six) hours as needed for wheezing or shortness of breath.   albuterol 108 (90 Base) MCG/ACT inhaler Commonly known as: VENTOLIN HFA Inhale 2 puffs into the lungs every 6 (six) hours as needed for wheezing or shortness of breath.   amLODipine 10 MG tablet Commonly known as: NORVASC Take 5 mg by mouth daily.   aspirin 81 MG chewable tablet Chew 81 mg by mouth every morning.   benzonatate 200 MG capsule Commonly known as: TESSALON Take 1 capsule (200 mg total) by mouth 3 (three) times daily as  needed for cough.   clopidogrel 75 MG tablet Commonly known as: PLAVIX Take 40 mg by mouth daily. Take daily   cyclobenzaprine 10 MG tablet Commonly known as: FLEXERIL Take 1 tablet (10 mg total) by mouth 3 (three) times daily as needed for muscle spasms.   diclofenac sodium 1 % Gel Commonly known as: VOLTAREN Apply 2 g topically 4 (four) times daily.   diphenoxylate-atropine 2.5-0.025 MG tablet Commonly known as: LOMOTIL TAKE 1 TABLET BY MOUTH 4 (FOUR) TIMES DAILY AS NEEDED FOR DIARRHEA OR LOOSE STOOLS.   Dovato 50-300 MG tablet Generic drug: dolutegravir-lamiVUDine TAKE 1 TABLET BY MOUTH DAILY   doxazosin 1 MG tablet Commonly known as: CARDURA Take 1 mg by mouth daily.   DULoxetine 60 MG capsule Commonly known as: CYMBALTA Take 1 capsule (60 mg total) by mouth  daily. Take with 30 mg fot a total of '90mg'$    DULoxetine 30 MG capsule Commonly known as: CYMBALTA Take 1 capsule (30 mg total) by mouth daily. TAKE WITH THE '60MG'$  FOR TOTAL OF '90MG'$    febuxostat 40 MG tablet Commonly known as: ULORIC Take 1 tablet (40 mg total) by mouth daily.   ferrous sulfate 325 (65 FE) MG tablet Take 650 mg by mouth daily with breakfast.   fluticasone 50 MCG/ACT nasal spray Commonly known as: FLONASE SPRAY 2 SPRAYS INTO EACH NOSTRIL EVERY DAY   furosemide 40 MG tablet Commonly known as: LASIX Take 40 mg by mouth daily. 40 mg nightly   GLUCOSAMINE 1500 COMPLEX PO Take 1 tablet by mouth 2 (two) times daily.   glucosamine-chondroitin 500-400 MG tablet Take by mouth.   glucose blood test strip Commonly known as: OneTouch Verio TEST BLOOD SUGAR 4 TIMES DAILY AND AS NEEDED   hydrALAZINE 100 MG tablet Commonly known as: APRESOLINE Take by mouth.   HYDROcodone bit-homatropine 5-1.5 MG/5ML syrup Commonly known as: HYCODAN Take 5 mLs by mouth every 6 (six) hours as needed for cough.   hyoscyamine 0.125 MG tablet Commonly known as: LEVSIN TAKE 1 TABLET (0.125 MG TOTAL) BY MOUTH EVERY 4 (FOUR) HOURS AS NEEDED.   icosapent Ethyl 1 g capsule Commonly known as: VASCEPA TAKE 2 CAPSULES BY MOUTH TWICE A DAY   insulin glargine 100 UNIT/ML injection Commonly known as: LANTUS In the event of insulin pump failure, inject 8 units twice daily   insulin lispro 100 UNIT/ML injection Commonly known as: HumaLOG USE 42 UNITS TO 120 UNITS PER PUMP DAILY AS DIRECTED   ipratropium 0.03 % nasal spray Commonly known as: ATROVENT USE 2 SPRAYS IN EACH NOSTRIL 2-3 TIMES DAILY   levocetirizine 5 MG tablet Commonly known as: XYZAL TAKE 1 TABLET BY MOUTH EVERY DAY IN THE EVENING   levothyroxine 300 MCG tablet Commonly known as: SYNTHROID Take 1 tablet (300 mcg total) by mouth daily.   lipase/protease/amylase 12000-38000 units Cpep capsule Commonly known as: CREON Take  2 capsules prior to meals and 1 capsule prior to snacks   LORazepam 0.5 MG tablet Commonly known as: ATIVAN Take by mouth.   losartan 100 MG tablet Commonly known as: COZAAR Take 100 mg by mouth daily.   meclizine 12.5 MG tablet Commonly known as: ANTIVERT Take 1 tablet (12.5 mg total) by mouth 3 (three) times daily as needed for dizziness.   metoCLOPramide 5 MG tablet Commonly known as: REGLAN Take 5 mg by mouth 4 (four) times daily.   metoprolol succinate 50 MG 24 hr tablet Commonly known as: TOPROL-XL Take 50 mg by mouth 2 (two)  times daily.   mirtazapine 30 MG tablet Commonly known as: REMERON Take 1 tablet (30 mg total) by mouth at bedtime.   Misc Intestinal Flora Regulat Caps Take 1 capsule by mouth every morning.   montelukast 10 MG tablet Commonly known as: SINGULAIR TAKE 1 TABLET BY MOUTH EVERYDAY AT BEDTIME   niacin 1000 MG CR tablet Commonly known as: NIASPAN TAKE 1 TABLET (1,000 MG TOTAL) BY MOUTH AT BEDTIME.   ondansetron 4 MG tablet Commonly known as: ZOFRAN Take 1 tablet (4 mg total) by mouth every 8 (eight) hours as needed. for nausea   pantoprazole 40 MG tablet Commonly known as: PROTONIX Take 40 mg by mouth 2 (two) times daily.   promethazine 25 MG suppository Commonly known as: PHENERGAN PLACE 1 SUPPOSITORY (25 MG TOTAL) RECTALLY EVERY 6 (SIX) HOURS AS NEEDED FOR NAUSEA OR VOMITING.   rosuvastatin 20 MG tablet Commonly known as: CRESTOR Take 1 tablet (20 mg total) by mouth at bedtime.   sevelamer carbonate 800 MG tablet Commonly known as: RENVELA Take 800 mg by mouth 3 (three) times daily. 2 tabs before each meal   sucralfate 1 g tablet Commonly known as: CARAFATE Take 1 tablet (1 g total) by mouth 2 (two) times daily.   tamsulosin 0.4 MG Caps capsule Commonly known as: FLOMAX TAKE 1 CAPSULE BY MOUTH EVERYDAY AT BEDTIME   Testosterone 20.25 MG/ACT (1.62%) Gel APPLY 3 PUMPS DAILY AS DIRECTED   traMADol 50 MG tablet Commonly known  as: ULTRAM Take 1 tablet (50 mg total) by mouth daily as needed.   traZODone 50 MG tablet Commonly known as: DESYREL Take by mouth.         Objective:   BP (!) 141/69   Pulse 71   Temp 98 F (36.7 C)   Ht '5\' 10"'$  (1.778 m)   SpO2 99%   BMI 28.98 kg/m   Wt Readings from Last 3 Encounters:  08/22/21 202 lb (91.6 kg)  08/03/21 201 lb (91.2 kg)  06/08/21 208 lb (94.3 kg)    Physical Exam Vitals and nursing note reviewed.  Constitutional:      General: He is not in acute distress.    Appearance: He is well-developed. He is not diaphoretic.  Eyes:     General: No scleral icterus.    Conjunctiva/sclera: Conjunctivae normal.  Neck:     Thyroid: No thyromegaly.  Musculoskeletal:     Right forearm: No swelling or deformity.     Left forearm: No swelling or deformity.     Right knee: Ecchymosis present. No bony tenderness or crepitus. Decreased range of motion (Decreased extension of both knees, chronic). Tenderness present over the lateral joint line and patellar tendon.     Instability Tests: Anterior drawer test negative. Posterior drawer test negative. Anterior Lachman test negative. Medial McMurray test negative.     Left knee: Ecchymosis present. No bony tenderness or crepitus. Decreased range of motion. No tenderness.     Instability Tests: Anterior drawer test negative. Posterior drawer test negative. Anterior Lachman test negative. Medial McMurray test negative.  Skin:    General: Skin is warm and dry.     Findings: No rash.  Neurological:     Mental Status: He is alert and oriented to person, place, and time.     Coordination: Coordination normal.  Psychiatric:        Behavior: Behavior normal.       Assessment & Plan:   Problem List Items Addressed This Visit  None Visit Diagnoses     Acute pain of right knee    -  Primary   Relevant Orders   Ambulatory referral to Physical Therapy   Recurrent falls       Relevant Orders   Ambulatory referral to  Physical Therapy       Had x-rays at the hospital said that everything was good.  Biggest concern is the recurrent falls  We will send to physical therapy to hopefully prevent future falls Follow up plan: Return if symptoms worsen or fail to improve.  Counseling provided for all of the vaccine components Orders Placed This Encounter  Procedures   Ambulatory referral to Physical Therapy    Caryl Pina, MD [facility] 09/07/2021, 9:23 AM

## 2021-09-08 DIAGNOSIS — N2581 Secondary hyperparathyroidism of renal origin: Secondary | ICD-10-CM | POA: Diagnosis not present

## 2021-09-08 DIAGNOSIS — D509 Iron deficiency anemia, unspecified: Secondary | ICD-10-CM | POA: Diagnosis not present

## 2021-09-08 DIAGNOSIS — N186 End stage renal disease: Secondary | ICD-10-CM | POA: Diagnosis not present

## 2021-09-08 DIAGNOSIS — E8779 Other fluid overload: Secondary | ICD-10-CM | POA: Diagnosis not present

## 2021-09-08 DIAGNOSIS — D631 Anemia in chronic kidney disease: Secondary | ICD-10-CM | POA: Diagnosis not present

## 2021-09-11 ENCOUNTER — Telehealth: Payer: Self-pay | Admitting: Family Medicine

## 2021-09-11 DIAGNOSIS — E8779 Other fluid overload: Secondary | ICD-10-CM | POA: Diagnosis not present

## 2021-09-11 DIAGNOSIS — D509 Iron deficiency anemia, unspecified: Secondary | ICD-10-CM | POA: Diagnosis not present

## 2021-09-11 DIAGNOSIS — N186 End stage renal disease: Secondary | ICD-10-CM | POA: Diagnosis not present

## 2021-09-11 DIAGNOSIS — D631 Anemia in chronic kidney disease: Secondary | ICD-10-CM | POA: Diagnosis not present

## 2021-09-11 DIAGNOSIS — N2581 Secondary hyperparathyroidism of renal origin: Secondary | ICD-10-CM | POA: Diagnosis not present

## 2021-09-11 IMAGING — DX DG CHEST 2V
2 series · 2 of 2 positions shown · non-contrast
Comparison: September 01, 2019

CLINICAL DATA: Cough

EXAM:
CHEST - 2 VIEW

[chest pa]
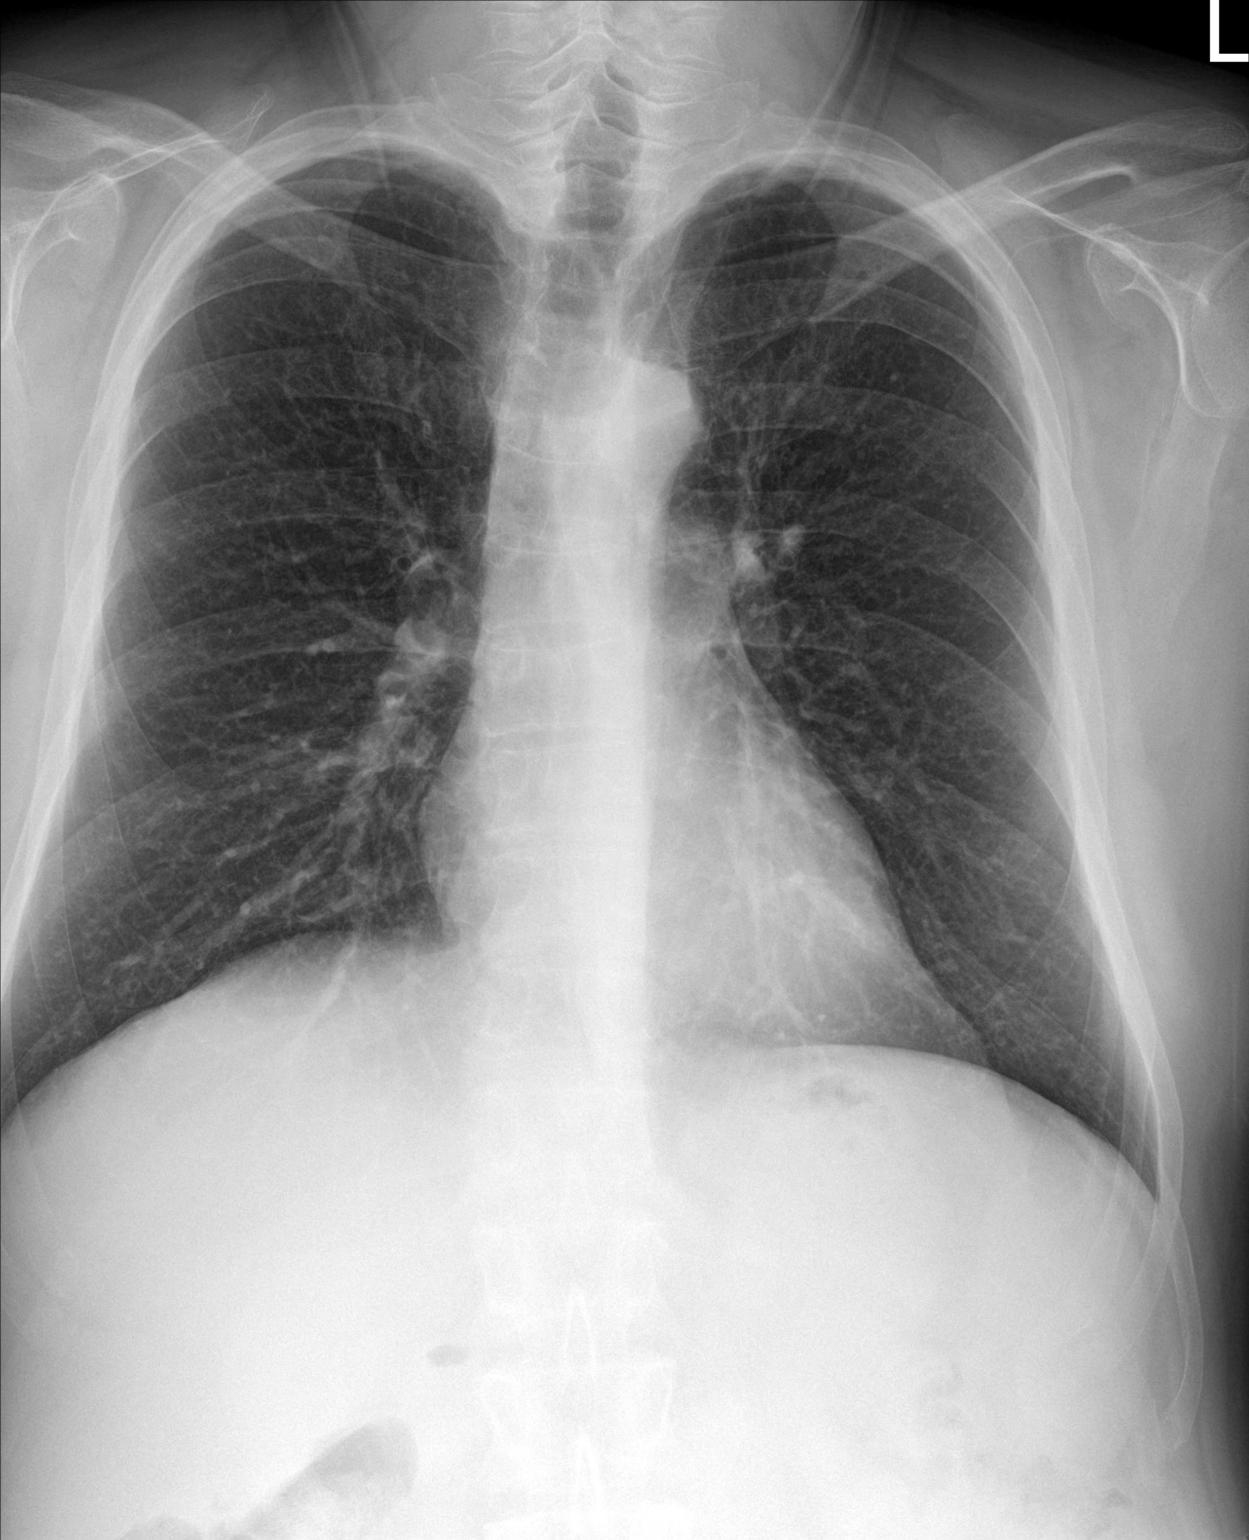

[chest lat]
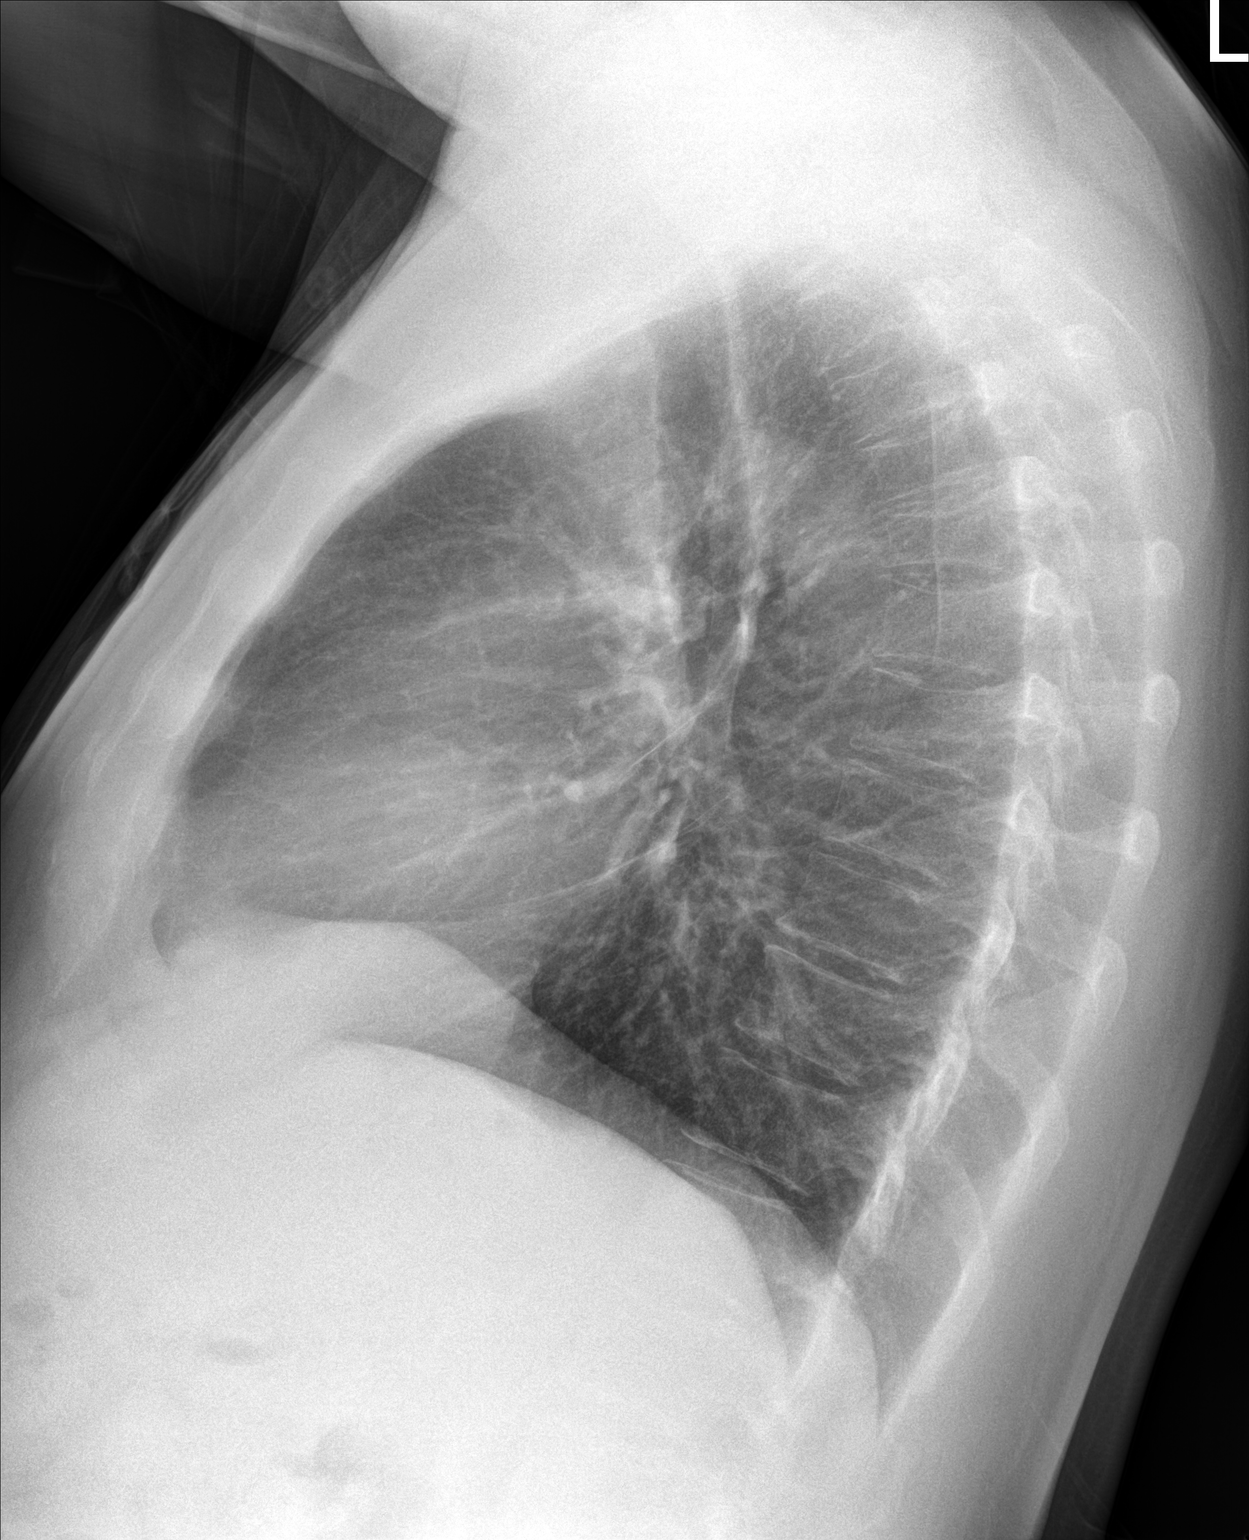

[2 of 2 positions shown; findings below may reference images not displayed]

FINDINGS: Lungs are clear. Heart size and pulmonary vascularity are normal. No
adenopathy. Anterior wedging of upper thoracic vertebral bodies is
stable.
IMPRESSION: No edema or airspace opacity.  Heart size normal.

## 2021-09-11 MED ORDER — DICLOFENAC SODIUM 1 % EX GEL: 2.0000 g | Freq: Four times a day (QID) | CUTANEOUS | 1 refills | Status: AC

## 2021-09-11 NOTE — Telephone Encounter (Signed)
Pt made aware that refills of Voltaren gel have been sent to his pharmacy.  Pt also states that he is having to start drug testing for his kidney transplant. He will bring the order with him when needed. Pt states that they use a [facility] lab as well.

## 2021-09-13 DIAGNOSIS — E8779 Other fluid overload: Secondary | ICD-10-CM | POA: Diagnosis not present

## 2021-09-13 DIAGNOSIS — D509 Iron deficiency anemia, unspecified: Secondary | ICD-10-CM | POA: Diagnosis not present

## 2021-09-13 DIAGNOSIS — D631 Anemia in chronic kidney disease: Secondary | ICD-10-CM | POA: Diagnosis not present

## 2021-09-13 DIAGNOSIS — N186 End stage renal disease: Secondary | ICD-10-CM | POA: Diagnosis not present

## 2021-09-13 DIAGNOSIS — N2581 Secondary hyperparathyroidism of renal origin: Secondary | ICD-10-CM | POA: Diagnosis not present

## 2021-09-14 ENCOUNTER — Encounter: Payer: Self-pay | Admitting: Internal Medicine

## 2021-09-14 NOTE — Telephone Encounter (Signed)
I spoke to the patient he states he has been having diarrhea for 2 months. Patient also having nausea and vomiting. Patient has follow up with GI and a Cdiff test was done that was negative per the patient. Patient also taking Lomotil with no relief. Please advise.  Cranston Alishia Lebo, CMA

## 2021-09-15 DIAGNOSIS — N2581 Secondary hyperparathyroidism of renal origin: Secondary | ICD-10-CM | POA: Diagnosis not present

## 2021-09-15 DIAGNOSIS — D509 Iron deficiency anemia, unspecified: Secondary | ICD-10-CM | POA: Diagnosis not present

## 2021-09-15 DIAGNOSIS — N186 End stage renal disease: Secondary | ICD-10-CM | POA: Diagnosis not present

## 2021-09-15 DIAGNOSIS — D631 Anemia in chronic kidney disease: Secondary | ICD-10-CM | POA: Diagnosis not present

## 2021-09-15 DIAGNOSIS — E8779 Other fluid overload: Secondary | ICD-10-CM | POA: Diagnosis not present

## 2021-09-18 DIAGNOSIS — D509 Iron deficiency anemia, unspecified: Secondary | ICD-10-CM | POA: Diagnosis not present

## 2021-09-18 DIAGNOSIS — E8779 Other fluid overload: Secondary | ICD-10-CM | POA: Diagnosis not present

## 2021-09-18 DIAGNOSIS — N2581 Secondary hyperparathyroidism of renal origin: Secondary | ICD-10-CM | POA: Diagnosis not present

## 2021-09-18 DIAGNOSIS — N186 End stage renal disease: Secondary | ICD-10-CM | POA: Diagnosis not present

## 2021-09-18 DIAGNOSIS — D631 Anemia in chronic kidney disease: Secondary | ICD-10-CM | POA: Diagnosis not present

## 2021-09-19 ENCOUNTER — Encounter: Payer: Self-pay | Admitting: Family Medicine

## 2021-09-19 ENCOUNTER — Ambulatory Visit: Payer: Medicare Other

## 2021-09-19 DIAGNOSIS — D510 Vitamin B12 deficiency anemia due to intrinsic factor deficiency: Secondary | ICD-10-CM

## 2021-09-20 DIAGNOSIS — N2581 Secondary hyperparathyroidism of renal origin: Secondary | ICD-10-CM | POA: Diagnosis not present

## 2021-09-20 DIAGNOSIS — E8779 Other fluid overload: Secondary | ICD-10-CM | POA: Diagnosis not present

## 2021-09-20 DIAGNOSIS — N186 End stage renal disease: Secondary | ICD-10-CM | POA: Diagnosis not present

## 2021-09-20 DIAGNOSIS — D631 Anemia in chronic kidney disease: Secondary | ICD-10-CM | POA: Diagnosis not present

## 2021-09-20 DIAGNOSIS — D509 Iron deficiency anemia, unspecified: Secondary | ICD-10-CM | POA: Diagnosis not present

## 2021-09-22 DIAGNOSIS — E8779 Other fluid overload: Secondary | ICD-10-CM | POA: Diagnosis not present

## 2021-09-22 DIAGNOSIS — D631 Anemia in chronic kidney disease: Secondary | ICD-10-CM | POA: Diagnosis not present

## 2021-09-22 DIAGNOSIS — D509 Iron deficiency anemia, unspecified: Secondary | ICD-10-CM | POA: Diagnosis not present

## 2021-09-22 DIAGNOSIS — N2581 Secondary hyperparathyroidism of renal origin: Secondary | ICD-10-CM | POA: Diagnosis not present

## 2021-09-22 DIAGNOSIS — N186 End stage renal disease: Secondary | ICD-10-CM | POA: Diagnosis not present

## 2021-09-24 MED ORDER — CYANOCOBALAMIN 1000 MCG/ML IJ SOLN: 1000.0000 ug | INTRAMUSCULAR | Status: AC

## 2021-09-25 DIAGNOSIS — D509 Iron deficiency anemia, unspecified: Secondary | ICD-10-CM | POA: Diagnosis not present

## 2021-09-25 DIAGNOSIS — D631 Anemia in chronic kidney disease: Secondary | ICD-10-CM | POA: Diagnosis not present

## 2021-09-25 DIAGNOSIS — N2581 Secondary hyperparathyroidism of renal origin: Secondary | ICD-10-CM | POA: Diagnosis not present

## 2021-09-25 DIAGNOSIS — E8779 Other fluid overload: Secondary | ICD-10-CM | POA: Diagnosis not present

## 2021-09-25 DIAGNOSIS — N186 End stage renal disease: Secondary | ICD-10-CM | POA: Diagnosis not present

## 2021-09-26 ENCOUNTER — Ambulatory Visit: Payer: Medicare Other | Attending: Family Medicine

## 2021-09-26 ENCOUNTER — Other Ambulatory Visit: Payer: Self-pay

## 2021-09-26 DIAGNOSIS — M6281 Muscle weakness (generalized): Secondary | ICD-10-CM | POA: Diagnosis not present

## 2021-09-26 DIAGNOSIS — M25561 Pain in right knee: Secondary | ICD-10-CM | POA: Insufficient documentation

## 2021-09-26 DIAGNOSIS — Z9181 History of falling: Secondary | ICD-10-CM | POA: Insufficient documentation

## 2021-09-26 DIAGNOSIS — R296 Repeated falls: Secondary | ICD-10-CM | POA: Insufficient documentation

## 2021-09-26 NOTE — Therapy (Signed)
OUTPATIENT PHYSICAL THERAPY LOWER EXTREMITY EVALUATION   Patient Name: Patrick Brown MRN: [id number] DOB:12-Oct-1970, 51 y.o., male Today's Date: 09/26/2021   PT End of Session - 09/26/21 1258     Visit Number 1    Number of Visits 12    Date for PT Re-Evaluation 11/30/21    PT Start Time 1303    PT Stop Time 1342    PT Time Calculation (min) 39 min    Activity Tolerance Patient tolerated treatment well    Behavior During Therapy WFL for tasks assessed/performed             Past Medical History:  Diagnosis Date   Anemia, iron deficiency On procrit   CAP (community acquired pneumonia)    CKD (chronic kidney disease) stage 3, GFR 30-59 ml/min (HCC)    Degenerative arthritis    Depression    Dyslipidemia    Gastroesophageal reflux disease    Gastroparesis diabeticorum ([place])    Hematuria, microscopic 10/09   work up negative (Dr. Amalia Hailey)   HIV positive ([place])    Hyperkalemia, diminished renal excretion 06/2011 secondary to TMP/SMZ; prior secondary to  ARBS;    Known potassium excretory defect; history of recurrent hyperkalemia due to diabetic renal disease; ACE/ARB contraindicated; hyperkalemia 06/2011 secondary to TMP-SMZ   Hypothyroidism    IDDM (insulin dependent diabetes mellitus)    38 years   Low HDL (under 40)    Proteinuria    Retinopathy    x2   SIRS (systemic inflammatory response syndrome) ([place])    Past Surgical History:  Procedure Laterality Date   CATARACT EXTRACTION Right    COLONOSCOPY     ESOPHAGOGASTRODUODENOSCOPY     EYE SURGERY  2006,2001   x2    HIP ARTHROPLASTY Right 05/10/2016   Procedure: RIGHT HIP HEMIARTHROPLASTY;  Surgeon: Marchia Bond, MD;  Location: [facility];  Service: Orthopedics;  Laterality: Right;   insulin pump     LASIK Bilateral    VIDEO BRONCHOSCOPY Bilateral 12/15/2012   Procedure: VIDEO BRONCHOSCOPY WITH FLUORO;  Surgeon: Kathee Delton, MD;  Location: [facility];  Service: Cardiopulmonary;  Laterality: Bilateral;   VITRECTOMY   bilateral   Patient Active Problem List   Diagnosis Date Noted   PCO (posterior capsular opacification), right 07/30/2021   Mild episode of recurrent major depressive disorder ([place]) 04/04/2021   Chronic idiopathic constipation 03/23/2021   Asthmatic bronchitis 03/05/2021   Stable treated proliferative diabetic retinopathy of right eye determined by examination associated with type 1 diabetes mellitus ([place]) 11/01/2019   Stable treated proliferative diabetic retinopathy of left eye without macular edema determined by examination associated with type 1 diabetes mellitus ([place]) 11/01/2019   Exocrine pancreatic insufficiency 09/29/2019   ESRD (end stage renal disease) on dialysis ([place]) 09/09/2019   Elevated SGOT 07/14/2019   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[place]) 07/14/2019   Anxiety 03/17/2019   Non-seasonal allergic rhinitis due to pollen 11/06/2016   Benign prostatic hyperplasia without lower urinary tract symptoms 04/18/2016   Vitamin D deficiency 04/18/2016   OSA (obstructive sleep apnea) 10/06/2014   Pseudophakia of both eyes 06/23/2013   CAP (community acquired pneumonia) 08/05/2012   HIV disease ([place]) 06/02/2012   Convulsions/seizures ([place]) 05/16/2012   Gastroparesis 04/18/2012   Anemia, iron deficiency    Type 1 diabetes mellitus ([place]) 05/08/2011   Acquired hypothyroidism 05/08/2011   REFERRING PROVIDER: Dettinger, Fransisca Kaufmann, MD  REFERRING DIAG: Acute pain of right knee; Recurrent falls  THERAPY DIAG:  Muscle weakness (generalized)  History of falling  Rationale  for Evaluation and Treatment Rehabilitation  ONSET DATE: 3-4 weeks ago  SUBJECTIVE:   SUBJECTIVE STATEMENT: Patient reports that about 3-4 weeks ago when he fell between his car and step. He then fell after getting home from the emergency department. He was not injured with any of these falls. He had not had any falls or issues prior to a month ago. He has noticed that his right knee has been stiff and sore since he began falling.    PERTINENT HISTORY: DM, history of seizures, depression, HTN  PAIN:  Are you having pain? No  PRECAUTIONS: Fall  WEIGHT BEARING RESTRICTIONS No  FALLS:  Has patient fallen in last 6 months? Yes. Number of falls 3 with the most recent being 7-10 days ago  LIVING ENVIRONMENT: Lives with: lives with their family Lives in: House/apartment Stairs: Yes: External: 1 steps; on left going up Has following equipment at home: Quad cane small base  OCCUPATION: disabled  PLOF: Independent  PATIENT GOALS improved strength and safety, navigate at least 1 step to get into his house   OBJECTIVE:   COGNITION:  Overall cognitive status: Within functional limits for tasks assessed     SENSATION: Patient reports no numbness or tingling  LOWER EXTREMITY ROM:  Active ROM Right eval Left eval  Hip flexion    Hip extension    Hip abduction    Hip adduction    Hip internal rotation    Hip external rotation    Knee flexion 113 114  Knee extension 8 with pulling along lateral calf 7  Ankle dorsiflexion    Ankle plantarflexion    Ankle inversion    Ankle eversion     (Blank rows = not tested)  LOWER EXTREMITY MMT:  MMT Right eval Left eval  Hip flexion 4-/5 4-/5  Hip extension    Hip abduction    Hip adduction    Hip internal rotation    Hip external rotation    Knee flexion 4/5 4-/5  Knee extension 4-/5 4-/5  Ankle dorsiflexion 3/5 3/5  Ankle plantarflexion    Ankle inversion    Ankle eversion     (Blank rows = not tested)  FUNCTIONAL TESTS:  5 times sit to stand: 25.11 seconds with UE support from armrests  Timed up and go (TUG): 27.03 seconds with cane   GAIT: Assistive device utilized: Quad cane small base Level of assistance: Modified independence Comments: carried cane while walking, shuffling pattern  TRANSFERS:  Sit to stand: required UE support and increased time  TODAY'S TREATMENT:                                   8/23 EXERCISE LOG  Exercise  Repetitions and Resistance Comments  SLR  15 reps   Bridge 10 reps    Bent knee fall out 10 reps each             Blank cell = exercise not performed today    PATIENT EDUCATION:  Education details: HEP, prognosis, POC Person educated: Patient Education method: Explanation Education comprehension: verbalized understanding   HOME EXERCISE PROGRAM: [id number]  ASSESSMENT:  CLINICAL IMPRESSION: Patient is a 51 y.o. male who was seen today for physical therapy evaluation and treatment secondary to a history of multiple falls. He is at a high risk of falls as evidenced by his gait mechanics, history of falling, and TUG and five time sit to stand  times. He ambulates with an assistive device. However, he does not properly utilize this equipment. He was educated and encouraged to properly utilize his assistive device for improved safety. Recommend that he continue with skilled physical therapy to address his impairments to maximize his safety and functional mobility.    OBJECTIVE IMPAIRMENTS Abnormal gait, decreased activity tolerance, decreased balance, decreased mobility, difficulty walking, decreased ROM, and decreased strength.   ACTIVITY LIMITATIONS squatting, stairs, transfers, and locomotion level  PARTICIPATION LIMITATIONS: driving, shopping, community activity, and yard work  PERSONAL FACTORS Fitness, Transportation, and 3+ comorbidities: DM, history of seizures, depression, HTN  are also affecting patient's functional outcome.   REHAB POTENTIAL: Fair    CLINICAL DECISION MAKING: Unstable/unpredictable  EVALUATION COMPLEXITY: High   GOALS: Goals reviewed with patient? Yes  SHORT TERM GOALS: Target date: 10/17/2021  Patient will be independent with his initial HEP.  Baseline: Goal status: INITIAL  2.  Patient will be able to improve his TUG time to 20 seconds or less.  Baseline:  Goal status: INITIAL  3.  Patient will be able to improve his five time sit to stand time  to 20 seconds or less with upper extremity support.  Baseline:  Goal status: INITIAL  4.  Patient will be able to ambulate with proper utilization of his cane.  Baseline:  Goal status: INITIAL  LONG TERM GOALS: Target date: 11/07/2021   Patient will be independent with his advanced HEP.  Baseline:  Goal status: INITIAL  2.  Patient will be able to independently navigate at least 1 step with 1 railing for improved ease of household navigation.  Baseline:  Goal status: INITIAL  3.  Patient will be able to improve his TUG time to 15 seconds or less.  Baseline:  Goal status: INITIAL  4.   Patient will be able to improve his five time sit to stand time to 15 seconds or less with upper extremity support.  Baseline:  Goal status: INITIAL  PLAN: PT FREQUENCY: 2x/week  PT DURATION: 6 weeks  PLANNED INTERVENTIONS: Therapeutic exercises, Therapeutic activity, Neuromuscular re-education, Balance training, Gait training, Patient/Family education, Self Care, Joint mobilization, Stair training, and Re-evaluation  PLAN FOR NEXT SESSION: nustep, lower extremity strengthening, review HEP, and balance interventions   Darlin Coco, PT 09/26/2021, 2:07 PM

## 2021-09-27 DIAGNOSIS — E8779 Other fluid overload: Secondary | ICD-10-CM | POA: Diagnosis not present

## 2021-09-27 DIAGNOSIS — N2581 Secondary hyperparathyroidism of renal origin: Secondary | ICD-10-CM | POA: Diagnosis not present

## 2021-09-27 DIAGNOSIS — N186 End stage renal disease: Secondary | ICD-10-CM | POA: Diagnosis not present

## 2021-09-27 DIAGNOSIS — D631 Anemia in chronic kidney disease: Secondary | ICD-10-CM | POA: Diagnosis not present

## 2021-09-27 DIAGNOSIS — E109 Type 1 diabetes mellitus without complications: Secondary | ICD-10-CM | POA: Diagnosis not present

## 2021-09-27 DIAGNOSIS — D509 Iron deficiency anemia, unspecified: Secondary | ICD-10-CM | POA: Diagnosis not present

## 2021-09-27 DIAGNOSIS — E1022 Type 1 diabetes mellitus with diabetic chronic kidney disease: Secondary | ICD-10-CM | POA: Diagnosis not present

## 2021-09-29 DIAGNOSIS — N2581 Secondary hyperparathyroidism of renal origin: Secondary | ICD-10-CM | POA: Diagnosis not present

## 2021-09-29 DIAGNOSIS — N186 End stage renal disease: Secondary | ICD-10-CM | POA: Diagnosis not present

## 2021-09-29 DIAGNOSIS — E8779 Other fluid overload: Secondary | ICD-10-CM | POA: Diagnosis not present

## 2021-09-29 DIAGNOSIS — E43 Unspecified severe protein-calorie malnutrition: Secondary | ICD-10-CM | POA: Diagnosis not present

## 2021-09-29 DIAGNOSIS — D631 Anemia in chronic kidney disease: Secondary | ICD-10-CM | POA: Diagnosis not present

## 2021-09-29 DIAGNOSIS — D509 Iron deficiency anemia, unspecified: Secondary | ICD-10-CM | POA: Diagnosis not present

## 2021-10-01 ENCOUNTER — Ambulatory Visit (INDEPENDENT_AMBULATORY_CARE_PROVIDER_SITE_OTHER): Payer: Medicare Other | Admitting: *Deleted

## 2021-10-01 ENCOUNTER — Other Ambulatory Visit: Payer: Medicare Other

## 2021-10-01 ENCOUNTER — Ambulatory Visit: Payer: Medicare Other

## 2021-10-01 DIAGNOSIS — D510 Vitamin B12 deficiency anemia due to intrinsic factor deficiency: Secondary | ICD-10-CM

## 2021-10-01 DIAGNOSIS — M6281 Muscle weakness (generalized): Secondary | ICD-10-CM

## 2021-10-01 DIAGNOSIS — R296 Repeated falls: Secondary | ICD-10-CM | POA: Diagnosis not present

## 2021-10-01 DIAGNOSIS — Z9181 History of falling: Secondary | ICD-10-CM | POA: Diagnosis not present

## 2021-10-01 DIAGNOSIS — M25561 Pain in right knee: Secondary | ICD-10-CM | POA: Diagnosis not present

## 2021-10-01 MED ORDER — CYANOCOBALAMIN 1000 MCG/ML IJ SOLN
1000.0000 ug | INTRAMUSCULAR | Status: AC
  Administered 2021-10-01 – 2021-11-07 (×2): 1000 ug via INTRAMUSCULAR

## 2021-10-01 NOTE — Therapy (Signed)
OUTPATIENT PHYSICAL THERAPY LOWER EXTREMITY EVALUATION   Patient Name: Patrick Brown MRN: [id number] DOB:[birth year], 51 y.o., male Today's Date: 10/01/2021   PT End of Session - 10/01/21 1351     Visit Number 2    Number of Visits 12    Date for PT Re-Evaluation 11/30/21    PT Start Time 1345    PT Stop Time 1427    PT Time Calculation (min) 42 min    Activity Tolerance Patient tolerated treatment well    Behavior During Therapy WFL for tasks assessed/performed             Past Medical History:  Diagnosis Date   Anemia, iron deficiency On procrit   CAP (community acquired pneumonia)    CKD (chronic kidney disease) stage 3, GFR 30-59 ml/min (HCC)    Degenerative arthritis    Depression    Dyslipidemia    Gastroesophageal reflux disease    Gastroparesis diabeticorum ([place])    Hematuria, microscopic 10/09   work up negative (Dr. Amalia Hailey)   HIV positive ([place])    Hyperkalemia, diminished renal excretion 06/2011 secondary to TMP/SMZ; prior secondary to  ARBS;    Known potassium excretory defect; history of recurrent hyperkalemia due to diabetic renal disease; ACE/ARB contraindicated; hyperkalemia 06/2011 secondary to TMP-SMZ   Hypothyroidism    IDDM (insulin dependent diabetes mellitus)    38 years   Low HDL (under 40)    Proteinuria    Retinopathy    x2   SIRS (systemic inflammatory response syndrome) ([place])    Past Surgical History:  Procedure Laterality Date   CATARACT EXTRACTION Right    COLONOSCOPY     ESOPHAGOGASTRODUODENOSCOPY     EYE SURGERY  2006,2001   x2    HIP ARTHROPLASTY Right 05/10/2016   Procedure: RIGHT HIP HEMIARTHROPLASTY;  Surgeon: Marchia Bond, MD;  Location: [facility];  Service: Orthopedics;  Laterality: Right;   insulin pump     LASIK Bilateral    VIDEO BRONCHOSCOPY Bilateral 12/15/2012   Procedure: VIDEO BRONCHOSCOPY WITH FLUORO;  Surgeon: Kathee Delton, MD;  [facility];  Service: Cardiopulmonary;  Laterality: Bilateral;   VITRECTOMY   bilateral   Patient Active Problem List   Diagnosis Date Noted   PCO (posterior capsular opacification), right 07/30/2021   Mild episode of recurrent major depressive disorder ([place]) 04/04/2021   Chronic idiopathic constipation 03/23/2021   Asthmatic bronchitis 03/05/2021   Stable treated proliferative diabetic retinopathy of right eye determined by examination associated with type 1 diabetes mellitus ([place]) 11/01/2019   Stable treated proliferative diabetic retinopathy of left eye without macular edema determined by examination associated with type 1 diabetes mellitus ([place]) 11/01/2019   Exocrine pancreatic insufficiency 09/29/2019   ESRD (end stage renal disease) on dialysis ([place]) 09/09/2019   Elevated SGOT 07/14/2019   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[place]) 07/14/2019   Anxiety 03/17/2019   Non-seasonal allergic rhinitis due to pollen 11/06/2016   Benign prostatic hyperplasia without lower urinary tract symptoms 04/18/2016   Vitamin D deficiency 04/18/2016   OSA (obstructive sleep apnea) 10/06/2014   Pseudophakia of both eyes 06/23/2013   CAP (community acquired pneumonia) 08/05/2012   HIV disease ([place]) 06/02/2012   Convulsions/seizures ([place]) 05/16/2012   Gastroparesis 04/18/2012   Anemia, iron deficiency    Type 1 diabetes mellitus ([place]) 05/08/2011   Acquired hypothyroidism 05/08/2011   REFERRING PROVIDER: Dettinger, Fransisca Kaufmann, MD  REFERRING DIAG: Acute pain of right knee; Recurrent falls  THERAPY DIAG:  Muscle weakness (generalized)  History of falling  Rationale  for Evaluation and Treatment Rehabilitation  ONSET DATE: 3-4 weeks ago  SUBJECTIVE:   SUBJECTIVE STATEMENT: Pt arrives for today's treatment session denying any pain, but does report some fatigue.   PERTINENT HISTORY: DM, history of seizures, depression, HTN  PAIN:  Are you having pain? No  PRECAUTIONS: Fall  WEIGHT BEARING RESTRICTIONS No  FALLS:  Has patient fallen in last 6 months? Yes. Number of falls 3 with the  most recent being 7-10 days ago  LIVING ENVIRONMENT: Lives with: lives with their family Lives in: House/apartment Stairs: Yes: External: 1 steps; on left going up Has following equipment at home: Quad cane small base  OCCUPATION: disabled  PLOF: Independent  PATIENT GOALS improved strength and safety, navigate at least 1 step to get into his house   OBJECTIVE:   COGNITION:  Overall cognitive status: Within functional limits for tasks assessed     SENSATION: Patient reports no numbness or tingling  LOWER EXTREMITY ROM:  Active ROM Right eval Left eval  Hip flexion    Hip extension    Hip abduction    Hip adduction    Hip internal rotation    Hip external rotation    Knee flexion 113 114  Knee extension 8 with pulling along lateral calf 7  Ankle dorsiflexion    Ankle plantarflexion    Ankle inversion    Ankle eversion     (Blank rows = not tested)  LOWER EXTREMITY MMT:  MMT Right eval Left eval  Hip flexion 4-/5 4-/5  Hip extension    Hip abduction    Hip adduction    Hip internal rotation    Hip external rotation    Knee flexion 4/5 4-/5  Knee extension 4-/5 4-/5  Ankle dorsiflexion 3/5 3/5  Ankle plantarflexion    Ankle inversion    Ankle eversion     (Blank rows = not tested)  FUNCTIONAL TESTS:  5 times sit to stand: 25.11 seconds with UE support from armrests  Timed up and go (TUG): 27.03 seconds with cane   GAIT: Assistive device utilized: Quad cane small base Level of assistance: Modified independence Comments: carried cane while walking, shuffling pattern  TRANSFERS:  Sit to stand: required UE support and increased time  TODAY'S TREATMENT:                                   8/23 EXERCISE LOG  Exercise Repetitions and Resistance Comments  Nustep Lvl 3 x 15 mins   SLR     Bridge    Bent knee fall out    LAQ 3# x 20 reps BLE   Seated Marches 3# x 20 reps BLE   Ball Squeezes X2 mins     Hip Abduction Red t-band x 2 mins   Ham  Curls Red t-band x 20 reps BLE   Heel/Toe Raises  X 2 mins each   STS 5 reps (mod A +1 required)    Blank cell = exercise not performed today    PATIENT EDUCATION:  Education details: HEP, prognosis, POC Person educated: Patient Education method: Explanation Education comprehension: verbalized understanding   HOME EXERCISE PROGRAM: [id number]  ASSESSMENT:  CLINICAL IMPRESSION: Pt arrives for today's treatment session denying any pain, but reports fatigue due to not sleeping well last night.  Pt instructed in seated BLE exercises to increase strength and function.  Pt requiring modA +1 to perform all STS transfers.  Pt given rest breaks as needed due to fatigue.  Pt denied any pain at the completion of today's treatment session.    OBJECTIVE IMPAIRMENTS Abnormal gait, decreased activity tolerance, decreased balance, decreased mobility, difficulty walking, decreased ROM, and decreased strength.   ACTIVITY LIMITATIONS squatting, stairs, transfers, and locomotion level  PARTICIPATION LIMITATIONS: driving, shopping, community activity, and yard work  PERSONAL FACTORS Fitness, Transportation, and 3+ comorbidities: DM, history of seizures, depression, HTN  are also affecting patient's functional outcome.   REHAB POTENTIAL: Fair    CLINICAL DECISION MAKING: Unstable/unpredictable  EVALUATION COMPLEXITY: High   GOALS: Goals reviewed with patient? Yes  SHORT TERM GOALS: Target date: 10/17/2021  Patient will be independent with his initial HEP.  Baseline: Goal status: INITIAL  2.  Patient will be able to improve his TUG time to 20 seconds or less.  Baseline:  Goal status: INITIAL  3.  Patient will be able to improve his five time sit to stand time to 20 seconds or less with upper extremity support.  Baseline:  Goal status: INITIAL  4.  Patient will be able to ambulate with proper utilization of his cane.  Baseline:  Goal status: INITIAL  LONG TERM GOALS: Target date:  11/07/2021   Patient will be independent with his advanced HEP.  Baseline:  Goal status: INITIAL  2.  Patient will be able to independently navigate at least 1 step with 1 railing for improved ease of household navigation.  Baseline:  Goal status: INITIAL  3.  Patient will be able to improve his TUG time to 15 seconds or less.  Baseline:  Goal status: INITIAL  4.   Patient will be able to improve his five time sit to stand time to 15 seconds or less with upper extremity support.  Baseline:  Goal status: INITIAL  PLAN: PT FREQUENCY: 2x/week  PT DURATION: 6 weeks  PLANNED INTERVENTIONS: Therapeutic exercises, Therapeutic activity, Neuromuscular re-education, Balance training, Gait training, Patient/Family education, Self Care, Joint mobilization, Stair training, and Re-evaluation  PLAN FOR NEXT SESSION: nustep, lower extremity strengthening, review HEP, and balance interventions   Kathrynn Ducking, PTA 10/01/2021, 2:32 PM

## 2021-10-01 NOTE — Progress Notes (Signed)
Vitamin b12 injection given and tolerated well.  

## 2021-10-02 DIAGNOSIS — N186 End stage renal disease: Secondary | ICD-10-CM | POA: Diagnosis not present

## 2021-10-02 DIAGNOSIS — E43 Unspecified severe protein-calorie malnutrition: Secondary | ICD-10-CM | POA: Diagnosis not present

## 2021-10-02 DIAGNOSIS — D631 Anemia in chronic kidney disease: Secondary | ICD-10-CM | POA: Diagnosis not present

## 2021-10-02 DIAGNOSIS — E8779 Other fluid overload: Secondary | ICD-10-CM | POA: Diagnosis not present

## 2021-10-02 DIAGNOSIS — N2581 Secondary hyperparathyroidism of renal origin: Secondary | ICD-10-CM | POA: Diagnosis not present

## 2021-10-02 DIAGNOSIS — D509 Iron deficiency anemia, unspecified: Secondary | ICD-10-CM | POA: Diagnosis not present

## 2021-10-03 ENCOUNTER — Ambulatory Visit: Payer: Medicare Other

## 2021-10-03 DIAGNOSIS — M6281 Muscle weakness (generalized): Secondary | ICD-10-CM | POA: Diagnosis not present

## 2021-10-03 DIAGNOSIS — Z9181 History of falling: Secondary | ICD-10-CM | POA: Diagnosis not present

## 2021-10-03 DIAGNOSIS — M25561 Pain in right knee: Secondary | ICD-10-CM | POA: Diagnosis not present

## 2021-10-03 DIAGNOSIS — R296 Repeated falls: Secondary | ICD-10-CM | POA: Diagnosis not present

## 2021-10-03 NOTE — Therapy (Signed)
OUTPATIENT PHYSICAL THERAPY LOWER EXTREMITY EVALUATION   Patient Name: Patrick Brown MRN: [id number] DOB:[birth year], 50 y.o., male Today's Date: 10/03/2021   PT End of Session - 10/03/21 1353     Visit Number 3    Number of Visits 12    Date for PT Re-Evaluation 11/30/21    PT Start Time 1345    PT Stop Time 1427    PT Time Calculation (min) 42 min    Activity Tolerance Patient tolerated treatment well    Behavior During Therapy WFL for tasks assessed/performed             Past Medical History:  Diagnosis Date   Anemia, iron deficiency On procrit   CAP (community acquired pneumonia)    CKD (chronic kidney disease) stage 3, GFR 30-59 ml/min (HCC)    Degenerative arthritis    Depression    Dyslipidemia    Gastroesophageal reflux disease    Gastroparesis diabeticorum ([place])    Hematuria, microscopic 10/09   work up negative (Dr. Amalia Hailey)   HIV positive ([place])    Hyperkalemia, diminished renal excretion 06/2011 secondary to TMP/SMZ; prior secondary to  ARBS;    Known potassium excretory defect; history of recurrent hyperkalemia due to diabetic renal disease; ACE/ARB contraindicated; hyperkalemia 06/2011 secondary to TMP-SMZ   Hypothyroidism    IDDM (insulin dependent diabetes mellitus)    38 years   Low HDL (under 40)    Proteinuria    Retinopathy    x2   SIRS (systemic inflammatory response syndrome) ([place])    Past Surgical History:  Procedure Laterality Date   CATARACT EXTRACTION Right    COLONOSCOPY     ESOPHAGOGASTRODUODENOSCOPY     EYE SURGERY  2006,2001   x2    HIP ARTHROPLASTY Right 05/10/2016   Procedure: RIGHT HIP HEMIARTHROPLASTY;  Surgeon: Marchia Bond, MD;  Location: [facility];  Service: Orthopedics;  Laterality: Right;   insulin pump     LASIK Bilateral    VIDEO BRONCHOSCOPY Bilateral 12/15/2012   Procedure: VIDEO BRONCHOSCOPY WITH FLUORO;  Surgeon: Kathee Delton, MD;  Location: [facility];  Service: Cardiopulmonary;  Laterality: Bilateral;   VITRECTOMY   bilateral   Patient Active Problem List   Diagnosis Date Noted   PCO (posterior capsular opacification), right 07/30/2021   Mild episode of recurrent major depressive disorder ([place]) 04/04/2021   Chronic idiopathic constipation 03/23/2021   Asthmatic bronchitis 03/05/2021   Stable treated proliferative diabetic retinopathy of right eye determined by examination associated with type 1 diabetes mellitus ([place]) 11/01/2019   Stable treated proliferative diabetic retinopathy of left eye without macular edema determined by examination associated with type 1 diabetes mellitus ([place]) 11/01/2019   Exocrine pancreatic insufficiency 09/29/2019   ESRD (end stage renal disease) on dialysis ([place]) 09/09/2019   Elevated SGOT 07/14/2019   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[place]) 07/14/2019   Anxiety 03/17/2019   Non-seasonal allergic rhinitis due to pollen 11/06/2016   Benign prostatic hyperplasia without lower urinary tract symptoms 04/18/2016   Vitamin D deficiency 04/18/2016   OSA (obstructive sleep apnea) 10/06/2014   Pseudophakia of both eyes 06/23/2013   CAP (community acquired pneumonia) 08/05/2012   HIV disease ([place]) 06/02/2012   Convulsions/seizures ([place]) 05/16/2012   Gastroparesis 04/18/2012   Anemia, iron deficiency    Type 1 diabetes mellitus ([place]) 05/08/2011   Acquired hypothyroidism 05/08/2011   REFERRING PROVIDER: Dettinger, Fransisca Kaufmann, MD  REFERRING DIAG: Acute pain of right knee; Recurrent falls  THERAPY DIAG:  Muscle weakness (generalized)  History of falling  Rationale  for Evaluation and Treatment Rehabilitation  ONSET DATE: 3-4 weeks ago  SUBJECTIVE:   SUBJECTIVE STATEMENT: Pt reports his legs feeling very tired after last treatment session, but denies any pain.   PERTINENT HISTORY: DM, history of seizures, depression, HTN  PAIN:  Are you having pain? No  PRECAUTIONS: Fall  WEIGHT BEARING RESTRICTIONS No  FALLS:  Has patient fallen in last 6 months? Yes. Number of falls 3 with the  most recent being 7-10 days ago  LIVING ENVIRONMENT: Lives with: lives with their family Lives in: House/apartment Stairs: Yes: External: 1 steps; on left going up Has following equipment at home: Quad cane small base  OCCUPATION: disabled  PLOF: Independent  PATIENT GOALS improved strength and safety, navigate at least 1 step to get into his house   OBJECTIVE:   COGNITION:  Overall cognitive status: Within functional limits for tasks assessed     SENSATION: Patient reports no numbness or tingling  LOWER EXTREMITY ROM:  Active ROM Right eval Left eval  Hip flexion    Hip extension    Hip abduction    Hip adduction    Hip internal rotation    Hip external rotation    Knee flexion 113 114  Knee extension 8 with pulling along lateral calf 7  Ankle dorsiflexion    Ankle plantarflexion    Ankle inversion    Ankle eversion     (Blank rows = not tested)  LOWER EXTREMITY MMT:  MMT Right eval Left eval  Hip flexion 4-/5 4-/5  Hip extension    Hip abduction    Hip adduction    Hip internal rotation    Hip external rotation    Knee flexion 4/5 4-/5  Knee extension 4-/5 4-/5  Ankle dorsiflexion 3/5 3/5  Ankle plantarflexion    Ankle inversion    Ankle eversion     (Blank rows = not tested)  FUNCTIONAL TESTS:  5 times sit to stand: 25.11 seconds with UE support from armrests  Timed up and go (TUG): 27.03 seconds with cane   GAIT: Assistive device utilized: Quad cane small base Level of assistance: Modified independence Comments: carried cane while walking, shuffling pattern  TRANSFERS:  Sit to stand: required UE support and increased time  TODAY'S TREATMENT:                                   8/30 EXERCISE LOG  Exercise Repetitions and Resistance Comments  Nustep Lvl 3 x 15 mins   Rows Red t-band x 20 reps   Extension Red t-band x 20 reps   Horizontal Abduction Red t-band x 20 reps   LAQ 3# x 20 reps BLE   Seated Marches 3# x 20 reps BLE   Ball  Squeezes X2 mins     Hip Abduction Red t-band x 2 mins   Ham Curls Red t-band x 20 reps BLE   Heel/Toe Raises  X 2 mins each   STS 5 reps (mod A +1 required)    Blank cell = exercise not performed today    PATIENT EDUCATION:  Education details: HEP, prognosis, POC Person educated: Patient Education method: Explanation Education comprehension: verbalized understanding   HOME EXERCISE PROGRAM: [id number]  ASSESSMENT:  CLINICAL IMPRESSION: Pt arrives for today's treatment session denying any pain.  Pt reports his legs feeling tired after last session, but it did not last long.  Pt introduced to BUE exercises  to assist with proper posture.  Pt requiring min cues for proper technique.  Pt denied any pain at completion of today's treatment session, but does endorse fatigue.    OBJECTIVE IMPAIRMENTS Abnormal gait, decreased activity tolerance, decreased balance, decreased mobility, difficulty walking, decreased ROM, and decreased strength.   ACTIVITY LIMITATIONS squatting, stairs, transfers, and locomotion level  PARTICIPATION LIMITATIONS: driving, shopping, community activity, and yard work  PERSONAL FACTORS Fitness, Transportation, and 3+ comorbidities: DM, history of seizures, depression, HTN  are also affecting patient's functional outcome.   REHAB POTENTIAL: Fair    CLINICAL DECISION MAKING: Unstable/unpredictable  EVALUATION COMPLEXITY: High   GOALS: Goals reviewed with patient? Yes  SHORT TERM GOALS: Target date: 10/17/2021  Patient will be independent with his initial HEP.  Baseline: Goal status: INITIAL  2.  Patient will be able to improve his TUG time to 20 seconds or less.  Baseline:  Goal status: INITIAL  3.  Patient will be able to improve his five time sit to stand time to 20 seconds or less with upper extremity support.  Baseline:  Goal status: INITIAL  4.  Patient will be able to ambulate with proper utilization of his cane.  Baseline:  Goal status:  INITIAL  LONG TERM GOALS: Target date: 11/07/2021   Patient will be independent with his advanced HEP.  Baseline:  Goal status: INITIAL  2.  Patient will be able to independently navigate at least 1 step with 1 railing for improved ease of household navigation.  Baseline:  Goal status: INITIAL  3.  Patient will be able to improve his TUG time to 15 seconds or less.  Baseline:  Goal status: INITIAL  4.   Patient will be able to improve his five time sit to stand time to 15 seconds or less with upper extremity support.  Baseline:  Goal status: INITIAL  PLAN: PT FREQUENCY: 2x/week  PT DURATION: 6 weeks  PLANNED INTERVENTIONS: Therapeutic exercises, Therapeutic activity, Neuromuscular re-education, Balance training, Gait training, Patient/Family education, Self Care, Joint mobilization, Stair training, and Re-evaluation  PLAN FOR NEXT SESSION: nustep, lower extremity strengthening, review HEP, and balance interventions   Kathrynn Ducking, PTA 10/03/2021, 2:30 PM

## 2021-10-04 DIAGNOSIS — D509 Iron deficiency anemia, unspecified: Secondary | ICD-10-CM | POA: Diagnosis not present

## 2021-10-04 DIAGNOSIS — Z992 Dependence on renal dialysis: Secondary | ICD-10-CM | POA: Diagnosis not present

## 2021-10-04 DIAGNOSIS — E8779 Other fluid overload: Secondary | ICD-10-CM | POA: Diagnosis not present

## 2021-10-04 DIAGNOSIS — E43 Unspecified severe protein-calorie malnutrition: Secondary | ICD-10-CM | POA: Diagnosis not present

## 2021-10-04 DIAGNOSIS — N186 End stage renal disease: Secondary | ICD-10-CM | POA: Diagnosis not present

## 2021-10-04 DIAGNOSIS — N2581 Secondary hyperparathyroidism of renal origin: Secondary | ICD-10-CM | POA: Diagnosis not present

## 2021-10-04 DIAGNOSIS — D631 Anemia in chronic kidney disease: Secondary | ICD-10-CM | POA: Diagnosis not present

## 2021-10-05 ENCOUNTER — Other Ambulatory Visit: Payer: Self-pay | Admitting: Family Medicine

## 2021-10-06 DIAGNOSIS — E43 Unspecified severe protein-calorie malnutrition: Secondary | ICD-10-CM | POA: Diagnosis not present

## 2021-10-06 DIAGNOSIS — D509 Iron deficiency anemia, unspecified: Secondary | ICD-10-CM | POA: Diagnosis not present

## 2021-10-06 DIAGNOSIS — E8779 Other fluid overload: Secondary | ICD-10-CM | POA: Diagnosis not present

## 2021-10-06 DIAGNOSIS — N2581 Secondary hyperparathyroidism of renal origin: Secondary | ICD-10-CM | POA: Diagnosis not present

## 2021-10-06 DIAGNOSIS — N186 End stage renal disease: Secondary | ICD-10-CM | POA: Diagnosis not present

## 2021-10-06 DIAGNOSIS — D631 Anemia in chronic kidney disease: Secondary | ICD-10-CM | POA: Diagnosis not present

## 2021-10-08 ENCOUNTER — Other Ambulatory Visit: Payer: Self-pay | Admitting: Family Medicine

## 2021-10-09 DIAGNOSIS — N186 End stage renal disease: Secondary | ICD-10-CM | POA: Diagnosis not present

## 2021-10-09 DIAGNOSIS — D631 Anemia in chronic kidney disease: Secondary | ICD-10-CM | POA: Diagnosis not present

## 2021-10-09 DIAGNOSIS — E43 Unspecified severe protein-calorie malnutrition: Secondary | ICD-10-CM | POA: Diagnosis not present

## 2021-10-09 DIAGNOSIS — D509 Iron deficiency anemia, unspecified: Secondary | ICD-10-CM | POA: Diagnosis not present

## 2021-10-09 DIAGNOSIS — E8779 Other fluid overload: Secondary | ICD-10-CM | POA: Diagnosis not present

## 2021-10-09 DIAGNOSIS — N2581 Secondary hyperparathyroidism of renal origin: Secondary | ICD-10-CM | POA: Diagnosis not present

## 2021-10-10 ENCOUNTER — Ambulatory Visit: Payer: Medicare Other | Attending: Family Medicine

## 2021-10-10 DIAGNOSIS — M6281 Muscle weakness (generalized): Secondary | ICD-10-CM | POA: Diagnosis not present

## 2021-10-10 DIAGNOSIS — Z9181 History of falling: Secondary | ICD-10-CM | POA: Diagnosis not present

## 2021-10-10 NOTE — Therapy (Signed)
OUTPATIENT PHYSICAL THERAPY LOWER EXTREMITY EVALUATION   Patient Name: Patrick Brown MRN: [id number] DOB:[birth year], 51 y.o., male Today's Date: 10/10/2021   PT End of Session - 10/10/21 1349     Visit Number 4    Number of Visits 12    Date for PT Re-Evaluation 11/30/21    PT Start Time 0814    PT Stop Time 1430    PT Time Calculation (min) 45 min    Activity Tolerance Patient tolerated treatment well    Behavior During Therapy WFL for tasks assessed/performed             Past Medical History:  Diagnosis Date   Anemia, iron deficiency On procrit   CAP (community acquired pneumonia)    CKD (chronic kidney disease) stage 3, GFR 30-59 ml/min (HCC)    Degenerative arthritis    Depression    Dyslipidemia    Gastroesophageal reflux disease    Gastroparesis diabeticorum ([place])    Hematuria, microscopic 10/09   work up negative (Dr. Amalia Hailey)   HIV positive ([place])    Hyperkalemia, diminished renal excretion 06/2011 secondary to TMP/SMZ; prior secondary to  ARBS;    Known potassium excretory defect; history of recurrent hyperkalemia due to diabetic renal disease; ACE/ARB contraindicated; hyperkalemia 06/2011 secondary to TMP-SMZ   Hypothyroidism    IDDM (insulin dependent diabetes mellitus)    38 years   Low HDL (under 40)    Proteinuria    Retinopathy    x2   SIRS (systemic inflammatory response syndrome) ([place])    Past Surgical History:  Procedure Laterality Date   CATARACT EXTRACTION Right    COLONOSCOPY     ESOPHAGOGASTRODUODENOSCOPY     EYE SURGERY  2006,2001   x2    HIP ARTHROPLASTY Right 05/10/2016   Procedure: RIGHT HIP HEMIARTHROPLASTY;  Surgeon: Marchia Bond, MD;  Location: [facility];  Service: Orthopedics;  Laterality: Right;   insulin pump     LASIK Bilateral    VIDEO BRONCHOSCOPY Bilateral 12/15/2012   Procedure: VIDEO BRONCHOSCOPY WITH FLUORO;  Surgeon: Kathee Delton, MD;  [facility];  Service: Cardiopulmonary;  Laterality: Bilateral;   VITRECTOMY   bilateral   Patient Active Problem List   Diagnosis Date Noted   PCO (posterior capsular opacification), right 07/30/2021   Mild episode of recurrent major depressive disorder ([place]) 04/04/2021   Chronic idiopathic constipation 03/23/2021   Asthmatic bronchitis 03/05/2021   Stable treated proliferative diabetic retinopathy of right eye determined by examination associated with type 1 diabetes mellitus ([place]) 11/01/2019   Stable treated proliferative diabetic retinopathy of left eye without macular edema determined by examination associated with type 1 diabetes mellitus ([place]) 11/01/2019   Exocrine pancreatic insufficiency 09/29/2019   ESRD (end stage renal disease) on dialysis ([place]) 09/09/2019   Elevated SGOT 07/14/2019   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[place]) 07/14/2019   Anxiety 03/17/2019   Non-seasonal allergic rhinitis due to pollen 11/06/2016   Benign prostatic hyperplasia without lower urinary tract symptoms 04/18/2016   Vitamin D deficiency 04/18/2016   OSA (obstructive sleep apnea) 10/06/2014   Pseudophakia of both eyes 06/23/2013   CAP (community acquired pneumonia) 08/05/2012   HIV disease ([place]) 06/02/2012   Convulsions/seizures ([place]) 05/16/2012   Gastroparesis 04/18/2012   Anemia, iron deficiency    Type 1 diabetes mellitus ([place]) 05/08/2011   Acquired hypothyroidism 05/08/2011   REFERRING PROVIDER: Dettinger, Fransisca Kaufmann, MD  REFERRING DIAG: Acute pain of right knee; Recurrent falls  THERAPY DIAG:  Muscle weakness (generalized)  History of falling  Rationale  for Evaluation and Treatment Rehabilitation  ONSET DATE: 3-4 weeks ago  SUBJECTIVE:   SUBJECTIVE STATEMENT: Pt reports feeling weak and tired "all over" with dull aching pain all over his body.   PERTINENT HISTORY: DM, history of seizures, depression, HTN  PAIN:  Are you having pain? Yes: NPRS scale: 3/10 Pain location: "all over"  PRECAUTIONS: Fall  WEIGHT BEARING RESTRICTIONS No  FALLS:  Has patient fallen in last  6 months? Yes. Number of falls 3 with the most recent being 7-10 days ago  LIVING ENVIRONMENT: Lives with: lives with their family Lives in: House/apartment Stairs: Yes: External: 1 steps; on left going up Has following equipment at home: Quad cane small base  OCCUPATION: disabled  PLOF: Independent  PATIENT GOALS improved strength and safety, navigate at least 1 step to get into his house   OBJECTIVE:   COGNITION:  Overall cognitive status: Within functional limits for tasks assessed     SENSATION: Patient reports no numbness or tingling  LOWER EXTREMITY ROM:  Active ROM Right eval Left eval  Hip flexion    Hip extension    Hip abduction    Hip adduction    Hip internal rotation    Hip external rotation    Knee flexion 113 114  Knee extension 8 with pulling along lateral calf 7  Ankle dorsiflexion    Ankle plantarflexion    Ankle inversion    Ankle eversion     (Blank rows = not tested)  LOWER EXTREMITY MMT:  MMT Right eval Left eval  Hip flexion 4-/5 4-/5  Hip extension    Hip abduction    Hip adduction    Hip internal rotation    Hip external rotation    Knee flexion 4/5 4-/5  Knee extension 4-/5 4-/5  Ankle dorsiflexion 3/5 3/5  Ankle plantarflexion    Ankle inversion    Ankle eversion     (Blank rows = not tested)  FUNCTIONAL TESTS:  5 times sit to stand: 25.11 seconds with UE support from armrests  Timed up and go (TUG): 27.03 seconds with cane   GAIT: Assistive device utilized: Quad cane small base Level of assistance: Modified independence Comments: carried cane while walking, shuffling pattern  TRANSFERS:  Sit to stand: required UE support and increased time  TODAY'S TREATMENT:                                   9/5 EXERCISE LOG  Exercise Repetitions and Resistance Comments  Nustep Lvl 4 x 15 mins   Rows Green t-band x 20 reps   Extension Green t-band x 20 reps   Horizontal Abduction Green t-band x 20 reps   LAQ 4# x 20 reps  BLE   Seated Marches 4# x 20 reps BLE   Ball Squeezes x3 mins     Hip Abduction Green t-band x 2 mins   Ham Curls Green t-band x 20 reps BLE   Heel/Toe Raises  x2 mins each   STS     Blank cell = exercise not performed today    PATIENT EDUCATION:  Education details: HEP, prognosis, POC Person educated: Patient Education method: Explanation Education comprehension: verbalized understanding   HOME EXERCISE PROGRAM: [id number]  ASSESSMENT:  CLINICAL IMPRESSION: Pt arrives for today's treatment session reporting "all over" achy pain.  Pt reports performing HEP at home as previously instructed and feels that his strength has improved since  beginning therapy.  Pt able to tolerate increase resistance with all exercises today with increased fatigue reported at completion of today's treatment session.  Pt would benefit from progression to some standing exercises at next session.    OBJECTIVE IMPAIRMENTS Abnormal gait, decreased activity tolerance, decreased balance, decreased mobility, difficulty walking, decreased ROM, and decreased strength.   ACTIVITY LIMITATIONS squatting, stairs, transfers, and locomotion level  PARTICIPATION LIMITATIONS: driving, shopping, community activity, and yard work  PERSONAL FACTORS Fitness, Transportation, and 3+ comorbidities: DM, history of seizures, depression, HTN  are also affecting patient's functional outcome.   REHAB POTENTIAL: Fair    CLINICAL DECISION MAKING: Unstable/unpredictable  EVALUATION COMPLEXITY: High   GOALS: Goals reviewed with patient? Yes  SHORT TERM GOALS: Target date: 10/17/2021  Patient will be independent with his initial HEP.  Baseline: Goal status: INITIAL  2.  Patient will be able to improve his TUG time to 20 seconds or less.  Baseline:  Goal status: INITIAL  3.  Patient will be able to improve his five time sit to stand time to 20 seconds or less with upper extremity support.  Baseline:  Goal status:  INITIAL  4.  Patient will be able to ambulate with proper utilization of his cane.  Baseline:  Goal status: INITIAL  LONG TERM GOALS: Target date: 11/07/2021   Patient will be independent with his advanced HEP.  Baseline:  Goal status: INITIAL  2.  Patient will be able to independently navigate at least 1 step with 1 railing for improved ease of household navigation.  Baseline:  Goal status: INITIAL  3.  Patient will be able to improve his TUG time to 15 seconds or less.  Baseline:  Goal status: INITIAL  4.   Patient will be able to improve his five time sit to stand time to 15 seconds or less with upper extremity support.  Baseline:  Goal status: INITIAL  PLAN: PT FREQUENCY: 2x/week  PT DURATION: 6 weeks  PLANNED INTERVENTIONS: Therapeutic exercises, Therapeutic activity, Neuromuscular re-education, Balance training, Gait training, Patient/Family education, Self Care, Joint mobilization, Stair training, and Re-evaluation  PLAN FOR NEXT SESSION: nustep, lower extremity strengthening, review HEP, and balance interventions   Kathrynn Ducking, PTA 10/10/2021, 2:32 PM

## 2021-10-11 DIAGNOSIS — D631 Anemia in chronic kidney disease: Secondary | ICD-10-CM | POA: Diagnosis not present

## 2021-10-11 DIAGNOSIS — N186 End stage renal disease: Secondary | ICD-10-CM | POA: Diagnosis not present

## 2021-10-11 DIAGNOSIS — E8779 Other fluid overload: Secondary | ICD-10-CM | POA: Diagnosis not present

## 2021-10-11 DIAGNOSIS — E119 Type 2 diabetes mellitus without complications: Secondary | ICD-10-CM | POA: Diagnosis not present

## 2021-10-11 DIAGNOSIS — D509 Iron deficiency anemia, unspecified: Secondary | ICD-10-CM | POA: Diagnosis not present

## 2021-10-11 DIAGNOSIS — N2581 Secondary hyperparathyroidism of renal origin: Secondary | ICD-10-CM | POA: Diagnosis not present

## 2021-10-11 DIAGNOSIS — E43 Unspecified severe protein-calorie malnutrition: Secondary | ICD-10-CM | POA: Diagnosis not present

## 2021-10-12 ENCOUNTER — Ambulatory Visit (INDEPENDENT_AMBULATORY_CARE_PROVIDER_SITE_OTHER): Payer: Medicare Other | Admitting: Family Medicine

## 2021-10-12 ENCOUNTER — Encounter: Payer: Self-pay | Admitting: Family Medicine

## 2021-10-12 VITALS — BP 180/80 | HR 65 | Temp 98.1°F | Ht 70.0 in | Wt 193.4 lb

## 2021-10-12 DIAGNOSIS — J849 Interstitial pulmonary disease, unspecified: Secondary | ICD-10-CM | POA: Diagnosis not present

## 2021-10-12 DIAGNOSIS — R051 Acute cough: Secondary | ICD-10-CM | POA: Diagnosis not present

## 2021-10-12 DIAGNOSIS — L0292 Furuncle, unspecified: Secondary | ICD-10-CM | POA: Diagnosis not present

## 2021-10-12 DIAGNOSIS — I10 Essential (primary) hypertension: Secondary | ICD-10-CM | POA: Diagnosis not present

## 2021-10-12 MED ORDER — HYDROCODONE BIT-HOMATROP MBR 5-1.5 MG/5ML PO SOLN: 5.0000 mL | Freq: Four times a day (QID) | ORAL | 0 refills | Status: DC | PRN

## 2021-10-12 MED ORDER — DOXYCYCLINE HYCLATE 100 MG PO TABS: 100.0000 mg | ORAL_TABLET | Freq: Two times a day (BID) | ORAL | 0 refills | Status: DC

## 2021-10-12 NOTE — Patient Instructions (Signed)
Skin Abscess  A skin abscess is an infected area on or under your skin that contains a collection of pus and other material. An abscess may also be called a furuncle, carbuncle, or boil. An abscess can occur in or on almost any part of your body. Some abscesses break open (rupture) on their own. Most continue to get worse unless they are treated. The infection can spread deeper into the body and eventually into your blood, which can make you feel ill. Treatment usually involves draining the abscess. What are the causes? An abscess occurs when germs, like bacteria, pass through your skin and cause an infection. This may be caused by: A scrape or cut on your skin. A puncture wound through your skin, including a needle injection or insect bite. Blocked oil or sweat glands. Blocked and infected hair follicles. A cyst that forms beneath your skin (sebaceous cyst) and becomes infected. What increases the risk? This condition is more likely to develop in people who: Have a weak body defense system (immune system). Have diabetes. Have dry and irritated skin. Get frequent injections or use illegal IV drugs. Have a foreign body in a wound, such as a splinter. Have problems with their lymph system or veins. What are the signs or symptoms? Symptoms of this condition include: A painful, firm bump under the skin. A bump with pus at the top. This may break through the skin and drain. Other symptoms include: Redness surrounding the abscess site. Warmth. Swelling of the lymph nodes (glands) near the abscess. Tenderness. A sore on the skin. How is this diagnosed? This condition may be diagnosed based on: A physical exam. Your medical history. A sample of pus. This may be used to find out what is causing the infection. Blood tests. Imaging tests, such as an ultrasound, CT scan, or MRI. How is this treated? A small abscess that drains on its own may not need treatment. Treatment for larger abscesses  may include: Moist heat or heat pack applied to the area several times a day. A procedure to drain the abscess (incision and drainage). Antibiotic medicines. For a severe abscess, you may first get antibiotics through an IV and then change to antibiotics by mouth. Follow these instructions at home: Medicines  Take over-the-counter and prescription medicines only as told by your health care provider. If you were prescribed an antibiotic medicine, take it as told by your health care provider. Do not stop taking the antibiotic even if you start to feel better. Abscess care  If you have an abscess that has not drained, apply heat to the affected area. Use the heat source that your health care provider recommends, such as a moist heat pack or a heating pad. Place a towel between your skin and the heat source. Leave the heat on for 20-30 minutes. Remove the heat if your skin turns bright red. This is especially important if you are unable to feel pain, heat, or cold. You may have a greater risk of getting burned. Follow instructions from your health care provider about how to take care of your abscess. Make sure you: Cover the abscess with a bandage (dressing). Change your dressing or gauze as told by your health care provider. Wash your hands with soap and water before you change the dressing or gauze. If soap and water are not available, use hand sanitizer. Check your abscess every day for signs of a worsening infection. Check for: More redness, swelling, or pain. More fluid or blood. Warmth.   More pus or a bad smell. General instructions To avoid spreading the infection: Do not share personal care items, towels, or hot tubs with others. Avoid making skin contact with other people. Keep all follow-up visits as told by your health care provider. This is important. Contact a health care provider if you have: More redness, swelling, or pain around your abscess. More fluid or blood coming from  your abscess. Warm skin around your abscess. More pus or a bad smell coming from your abscess. Muscle aches. Chills or a general ill feeling. Get help right away if you: Have severe pain. See red streaks on your skin spreading away from the abscess. See redness that spreads quickly. Have a fever or chills. Summary A skin abscess is an infected area on or under your skin that contains a collection of pus and other material. A small abscess that drains on its own may not need treatment. Treatment for larger abscesses may include having a procedure to drain the abscess and taking an antibiotic. This information is not intended to replace advice given to you by your health care provider. Make sure you discuss any questions you have with your health care provider. Document Revised: 04/26/2021 Document Reviewed: 10/30/2020 Elsevier Patient Education  2023 [employer]  

## 2021-10-12 NOTE — Progress Notes (Signed)
Acute Office Visit  Subjective:     Patient ID: Patrick Brown, male    DOB: 01/17/1971, 51 y.o.   MRN: [id number]  Chief Complaint  Patient presents with   Cough   Recurrent Skin Infections    Cough Associated symptoms include chills. Pertinent negatives include no chest pain, fever, heartburn, hemoptysis, sore throat or shortness of breath.   Patient is in today for a boil under his left arm for 1 week. It is red and tender. It started draining a small amount yesterday.   Cough: Patient complains of nonproductive cough and wheezing.  Symptoms began 4 weeks ago.  The cough is non-productive, without wheezing, dyspnea or hemoptysis, harsh, waxing and waning over time and is aggravated by nothing Associated symptoms include:chills and wheezing. Patient does have a history of chronic lung disease. He has been taking his prescribed inhalers and tessalon perles without improvement.  Review of Systems  Constitutional:  Positive for chills. Negative for diaphoresis and fever.  HENT:  Negative for congestion, sinus pain and sore throat.   Respiratory:  Positive for cough. Negative for hemoptysis, sputum production and shortness of breath.   Cardiovascular:  Negative for chest pain, palpitations, orthopnea, claudication, leg swelling and PND.  Gastrointestinal:  Negative for heartburn, nausea and vomiting.        Objective:    BP (!) 180/80 Comment: at home reading per pt  Pulse 65   Temp 98.1 F (36.7 C) (Temporal)   Ht '5\' 10"'$  (1.778 m)   Wt 193 lb 6 oz (87.7 kg)   SpO2 99%   BMI 27.75 kg/m    Physical Exam Vitals and nursing note reviewed.  Constitutional:      General: He is not in acute distress.    Appearance: He is not ill-appearing, toxic-appearing or diaphoretic.  HENT:     Nose: Nose normal.     Mouth/Throat:     Mouth: Mucous membranes are moist.     Pharynx: Oropharynx is clear.  Eyes:     General:        Right eye: No discharge.        Left eye: No  discharge.     Conjunctiva/sclera: Conjunctivae normal.  Cardiovascular:     Rate and Rhythm: Normal rate and regular rhythm.     Heart sounds: Normal heart sounds. No murmur heard. Pulmonary:     Effort: Pulmonary effort is normal. No respiratory distress.     Breath sounds: Normal breath sounds. No stridor. No wheezing, rhonchi or rales.  Chest:     Chest wall: No tenderness.  Abdominal:     General: Bowel sounds are normal.     Palpations: Abdomen is soft.  Musculoskeletal:     Right lower leg: No edema.     Left lower leg: No edema.  Skin:    General: Skin is warm and dry.     Findings: Abscess present.     Comments: 2 cm abscess to right axilla with erythema, mild tenderness, and yellow drainage.   Neurological:     Mental Status: He is alert and oriented to person, place, and time.  Psychiatric:        Mood and Affect: Mood normal.        Behavior: Behavior normal.     No results found for any visits on 10/12/21.      Assessment & Plan:   Patrick Brown was seen today for cough and recurrent skin infections.  Diagnoses and all orders  for this visit:  Boil Actively draining. Doxycyline as below. Discussed warm compresses, promoting drainage. Handout given.  -     doxycycline (VIBRA-TABS) 100 MG tablet; Take 1 tablet (100 mg total) by mouth 2 (two) times daily for 7 days. 1 po bid  Acute cough Interstitial lung disease (Patrick Brown) Lung clear on exam today. Cough x 4 weeks without improvement. Will cover with doxycyline given chronic lung disease. Continue inhalers as prescribed.  -     HYDROcodone bit-homatropine (HYCODAN) 5-1.5 MG/5ML syrup; Take 5 mLs by mouth every 6 (six) hours as needed for cough. -     doxycycline (VIBRA-TABS) 100 MG tablet; Take 1 tablet (100 mg total) by mouth 2 (two) times daily for 7 days. 1 po bid  Primary Hypertension Elevated today, asymptotic. Continue to monitor at home and notify for persistently elevated readings.   Return to office for new or  worsening symptoms, or if symptoms persist.   The patient indicates understanding of these issues and agrees with the plan.  Gwenlyn Perking, FNP

## 2021-10-13 DIAGNOSIS — N186 End stage renal disease: Secondary | ICD-10-CM | POA: Diagnosis not present

## 2021-10-13 DIAGNOSIS — E8779 Other fluid overload: Secondary | ICD-10-CM | POA: Diagnosis not present

## 2021-10-13 DIAGNOSIS — E43 Unspecified severe protein-calorie malnutrition: Secondary | ICD-10-CM | POA: Diagnosis not present

## 2021-10-13 DIAGNOSIS — N2581 Secondary hyperparathyroidism of renal origin: Secondary | ICD-10-CM | POA: Diagnosis not present

## 2021-10-13 DIAGNOSIS — D631 Anemia in chronic kidney disease: Secondary | ICD-10-CM | POA: Diagnosis not present

## 2021-10-13 DIAGNOSIS — D509 Iron deficiency anemia, unspecified: Secondary | ICD-10-CM | POA: Diagnosis not present

## 2021-10-15 ENCOUNTER — Ambulatory Visit: Payer: Medicare Other

## 2021-10-15 DIAGNOSIS — Z9181 History of falling: Secondary | ICD-10-CM

## 2021-10-15 DIAGNOSIS — N186 End stage renal disease: Secondary | ICD-10-CM | POA: Diagnosis not present

## 2021-10-15 DIAGNOSIS — I120 Hypertensive chronic kidney disease with stage 5 chronic kidney disease or end stage renal disease: Secondary | ICD-10-CM | POA: Diagnosis not present

## 2021-10-15 DIAGNOSIS — E10319 Type 1 diabetes mellitus with unspecified diabetic retinopathy without macular edema: Secondary | ICD-10-CM | POA: Diagnosis not present

## 2021-10-15 DIAGNOSIS — M6281 Muscle weakness (generalized): Secondary | ICD-10-CM

## 2021-10-15 DIAGNOSIS — Z9641 Presence of insulin pump (external) (internal): Secondary | ICD-10-CM | POA: Diagnosis not present

## 2021-10-15 DIAGNOSIS — E10649 Type 1 diabetes mellitus with hypoglycemia without coma: Secondary | ICD-10-CM | POA: Diagnosis not present

## 2021-10-15 DIAGNOSIS — E1040 Type 1 diabetes mellitus with diabetic neuropathy, unspecified: Secondary | ICD-10-CM | POA: Diagnosis not present

## 2021-10-15 DIAGNOSIS — Z992 Dependence on renal dialysis: Secondary | ICD-10-CM | POA: Diagnosis not present

## 2021-10-15 DIAGNOSIS — E1022 Type 1 diabetes mellitus with diabetic chronic kidney disease: Secondary | ICD-10-CM | POA: Diagnosis not present

## 2021-10-15 NOTE — Therapy (Signed)
OUTPATIENT PHYSICAL THERAPY LOWER EXTREMITY EVALUATION   Patient Name: Patrick Brown MRN: [id number] DOB:01-31-1971, 51 y.o., male Today's Date: 10/15/2021   PT End of Session - 10/15/21 1352     Visit Number 5    Number of Visits 12    Date for PT Re-Evaluation 11/30/21    PT Start Time 4315    PT Stop Time 4008    PT Time Calculation (min) 46 min    Activity Tolerance Patient tolerated treatment well    Behavior During Therapy WFL for tasks assessed/performed             Past Medical History:  Diagnosis Date   Anemia, iron deficiency On procrit   CAP (community acquired pneumonia)    CKD (chronic kidney disease) stage 3, GFR 30-59 ml/min (HCC)    Degenerative arthritis    Depression    Dyslipidemia    Gastroesophageal reflux disease    Gastroparesis diabeticorum ([place])    Hematuria, microscopic 10/09   work up negative (Dr. Amalia Hailey)   HIV positive ([place])    Hyperkalemia, diminished renal excretion 06/2011 secondary to TMP/SMZ; prior secondary to  ARBS;    Known potassium excretory defect; history of recurrent hyperkalemia due to diabetic renal disease; ACE/ARB contraindicated; hyperkalemia 06/2011 secondary to TMP-SMZ   Hypothyroidism    IDDM (insulin dependent diabetes mellitus)    38 years   Low HDL (under 40)    Proteinuria    Retinopathy    x2   SIRS (systemic inflammatory response syndrome) ([place])    Past Surgical History:  Procedure Laterality Date   CATARACT EXTRACTION Right    COLONOSCOPY     ESOPHAGOGASTRODUODENOSCOPY     EYE SURGERY  2006,2001   x2    HIP ARTHROPLASTY Right 05/10/2016   Procedure: RIGHT HIP HEMIARTHROPLASTY;  Surgeon: Marchia Bond, MD;  Location: [facility];  Service: Orthopedics;  Laterality: Right;   insulin pump     LASIK Bilateral    VIDEO BRONCHOSCOPY Bilateral 12/15/2012   Procedure: VIDEO BRONCHOSCOPY WITH FLUORO;  Surgeon: Kathee Delton, MD;  [facility];  Service: Cardiopulmonary;  Laterality: Bilateral;   VITRECTOMY   bilateral   Patient Active Problem List   Diagnosis Date Noted   PCO (posterior capsular opacification), right 07/30/2021   Mild episode of recurrent major depressive disorder ([place]) 04/04/2021   Chronic idiopathic constipation 03/23/2021   Asthmatic bronchitis 03/05/2021   Stable treated proliferative diabetic retinopathy of right eye determined by examination associated with type 1 diabetes mellitus ([place]) 11/01/2019   Stable treated proliferative diabetic retinopathy of left eye without macular edema determined by examination associated with type 1 diabetes mellitus ([place]) 11/01/2019   Exocrine pancreatic insufficiency 09/29/2019   ESRD (end stage renal disease) on dialysis ([place]) 09/09/2019   Elevated SGOT 07/14/2019   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[place]) 07/14/2019   Anxiety 03/17/2019   Non-seasonal allergic rhinitis due to pollen 11/06/2016   Benign prostatic hyperplasia without lower urinary tract symptoms 04/18/2016   Vitamin D deficiency 04/18/2016   OSA (obstructive sleep apnea) 10/06/2014   Pseudophakia of both eyes 06/23/2013   CAP (community acquired pneumonia) 08/05/2012   HIV disease ([place]) 06/02/2012   Convulsions/seizures ([place]) 05/16/2012   Gastroparesis 04/18/2012   Anemia, iron deficiency    Type 1 diabetes mellitus ([place]) 05/08/2011   Acquired hypothyroidism 05/08/2011   REFERRING PROVIDER: Dettinger, Fransisca Kaufmann, MD  REFERRING DIAG: Acute pain of right knee; Recurrent falls  THERAPY DIAG:  Muscle weakness (generalized)  History of falling  Rationale  for Evaluation and Treatment Rehabilitation  ONSET DATE: 3-4 weeks ago  SUBJECTIVE:   SUBJECTIVE STATEMENT: Pt reports feeling "okay" today and he had to go to the endocrinologist today.    PERTINENT HISTORY: DM, history of seizures, depression, HTN  PAIN:  Are you having pain? Yes: NPRS scale: 2/10 Pain location: "all over"  PRECAUTIONS: Fall  WEIGHT BEARING RESTRICTIONS No  FALLS:  Has patient fallen in last 6  months? Yes. Number of falls 3 with the most recent being 7-10 days ago  LIVING ENVIRONMENT: Lives with: lives with their family Lives in: House/apartment Stairs: Yes: External: 1 steps; on left going up Has following equipment at home: Quad cane small base  OCCUPATION: disabled  PLOF: Independent  PATIENT GOALS improved strength and safety, navigate at least 1 step to get into his house   OBJECTIVE:   COGNITION:  Overall cognitive status: Within functional limits for tasks assessed     SENSATION: Patient reports no numbness or tingling  LOWER EXTREMITY ROM:  Active ROM Right eval Left eval  Hip flexion    Hip extension    Hip abduction    Hip adduction    Hip internal rotation    Hip external rotation    Knee flexion 113 114  Knee extension 8 with pulling along lateral calf 7  Ankle dorsiflexion    Ankle plantarflexion    Ankle inversion    Ankle eversion     (Blank rows = not tested)  LOWER EXTREMITY MMT:  MMT Right eval Left eval  Hip flexion 4-/5 4-/5  Hip extension    Hip abduction    Hip adduction    Hip internal rotation    Hip external rotation    Knee flexion 4/5 4-/5  Knee extension 4-/5 4-/5  Ankle dorsiflexion 3/5 3/5  Ankle plantarflexion    Ankle inversion    Ankle eversion     (Blank rows = not tested)  FUNCTIONAL TESTS:  5 times sit to stand: 25.11 seconds with UE support from armrests  Timed up and go (TUG): 27.03 seconds with cane   GAIT: Assistive device utilized: Quad cane small base Level of assistance: Modified independence Comments: carried cane while walking, shuffling pattern  TRANSFERS:  Sit to stand: required UE support and increased time  TODAY'S TREATMENT:                                   9/11 EXERCISE LOG  Exercise Repetitions and Resistance Comments  Nustep Lvl 4 x 15 mins   Rows    Extension    Horizontal Abduction    Forward Step Ups 4" step x 20 reps BLE, BUE support   LAQ 4# x 20 reps BLE   Seated  Marches 4# x 20 reps BLE   Ball Squeezes x3 mins     Hip Abduction Green t-band x 2 mins   Ham Curls Green t-band x 20 reps BLE   Heel/Toe Raises  X20 reps each (standing)   STS X10 reps    Blank cell = exercise not performed today    PATIENT EDUCATION:  Education details: HEP, prognosis, POC Person educated: Patient Education method: Explanation Education comprehension: verbalized understanding   HOME EXERCISE PROGRAM: [id number]  ASSESSMENT:  CLINICAL IMPRESSION: Pt arrives for today's treatment session reporting 2/10 "all over" pain.  Pt able to tolerate introduction to standing exercises today with min cues required for proper  technique and pacing.  Pt requiring cues to avoid pulling with BUE.  Pt able to perform seated exercises with decreased rest breaks needed. Pt denied any increase in pain at completion of today's treatment session.    OBJECTIVE IMPAIRMENTS Abnormal gait, decreased activity tolerance, decreased balance, decreased mobility, difficulty walking, decreased ROM, and decreased strength.   ACTIVITY LIMITATIONS squatting, stairs, transfers, and locomotion level  PARTICIPATION LIMITATIONS: driving, shopping, community activity, and yard work  PERSONAL FACTORS Fitness, Transportation, and 3+ comorbidities: DM, history of seizures, depression, HTN  are also affecting patient's functional outcome.   REHAB POTENTIAL: Fair    CLINICAL DECISION MAKING: Unstable/unpredictable  EVALUATION COMPLEXITY: High   GOALS: Goals reviewed with patient? Yes  SHORT TERM GOALS: Target date: 10/17/2021  Patient will be independent with his initial HEP.  Baseline: Goal status: INITIAL  2.  Patient will be able to improve his TUG time to 20 seconds or less.  Baseline:  Goal status: INITIAL  3.  Patient will be able to improve his five time sit to stand time to 20 seconds or less with upper extremity support.  Baseline:  Goal status: INITIAL  4.  Patient will be able to  ambulate with proper utilization of his cane.  Baseline:  Goal status: INITIAL  LONG TERM GOALS: Target date: 11/07/2021   Patient will be independent with his advanced HEP.  Baseline:  Goal status: INITIAL  2.  Patient will be able to independently navigate at least 1 step with 1 railing for improved ease of household navigation.  Baseline:  Goal status: INITIAL  3.  Patient will be able to improve his TUG time to 15 seconds or less.  Baseline:  Goal status: INITIAL  4.   Patient will be able to improve his five time sit to stand time to 15 seconds or less with upper extremity support.  Baseline:  Goal status: INITIAL  PLAN: PT FREQUENCY: 2x/week  PT DURATION: 6 weeks  PLANNED INTERVENTIONS: Therapeutic exercises, Therapeutic activity, Neuromuscular re-education, Balance training, Gait training, Patient/Family education, Self Care, Joint mobilization, Stair training, and Re-evaluation  PLAN FOR NEXT SESSION: nustep, lower extremity strengthening, review HEP, and balance interventions   Kathrynn Ducking, PTA 10/15/2021, 2:36 PM

## 2021-10-16 DIAGNOSIS — N186 End stage renal disease: Secondary | ICD-10-CM | POA: Diagnosis not present

## 2021-10-16 DIAGNOSIS — N2581 Secondary hyperparathyroidism of renal origin: Secondary | ICD-10-CM | POA: Diagnosis not present

## 2021-10-16 DIAGNOSIS — D509 Iron deficiency anemia, unspecified: Secondary | ICD-10-CM | POA: Diagnosis not present

## 2021-10-16 DIAGNOSIS — E8779 Other fluid overload: Secondary | ICD-10-CM | POA: Diagnosis not present

## 2021-10-16 DIAGNOSIS — D631 Anemia in chronic kidney disease: Secondary | ICD-10-CM | POA: Diagnosis not present

## 2021-10-16 DIAGNOSIS — E43 Unspecified severe protein-calorie malnutrition: Secondary | ICD-10-CM | POA: Diagnosis not present

## 2021-10-18 ENCOUNTER — Other Ambulatory Visit: Payer: Self-pay | Admitting: Family Medicine

## 2021-10-18 ENCOUNTER — Encounter: Payer: Self-pay | Admitting: Family Medicine

## 2021-10-18 DIAGNOSIS — D631 Anemia in chronic kidney disease: Secondary | ICD-10-CM | POA: Diagnosis not present

## 2021-10-18 DIAGNOSIS — E43 Unspecified severe protein-calorie malnutrition: Secondary | ICD-10-CM | POA: Diagnosis not present

## 2021-10-18 DIAGNOSIS — N2581 Secondary hyperparathyroidism of renal origin: Secondary | ICD-10-CM | POA: Diagnosis not present

## 2021-10-18 DIAGNOSIS — D509 Iron deficiency anemia, unspecified: Secondary | ICD-10-CM | POA: Diagnosis not present

## 2021-10-18 DIAGNOSIS — E8779 Other fluid overload: Secondary | ICD-10-CM | POA: Diagnosis not present

## 2021-10-18 DIAGNOSIS — L0292 Furuncle, unspecified: Secondary | ICD-10-CM

## 2021-10-18 DIAGNOSIS — J849 Interstitial pulmonary disease, unspecified: Secondary | ICD-10-CM

## 2021-10-18 DIAGNOSIS — N186 End stage renal disease: Secondary | ICD-10-CM | POA: Diagnosis not present

## 2021-10-18 IMAGING — CT CT PARANASAL SINUSES LIMITED
2 of 4 series · 16 of 22 positions shown, 18 images · non-contrast
Comparison: None.

CLINICAL DATA: Acute bronchitis and chronic sinusitis

EXAM:
CT PARANASAL SINUS LIMITED WITHOUT CONTRAST
TECHNIQUE: Non-contiguous multidetector CT images of the paranasal sinuses were
obtained in a single plane without contrast.

[Series 4: limited sinus bone · axial · 0.29mm/px · z∈[+132,+202]mm · 8 of 10 slices shown, 10 images]
[im 2/10  brain]
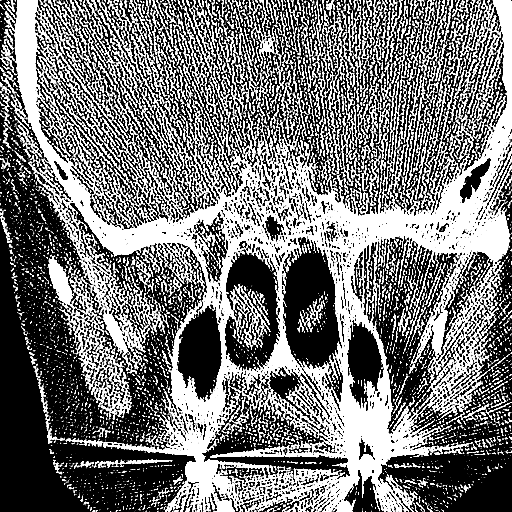
[im 2/10  bone]
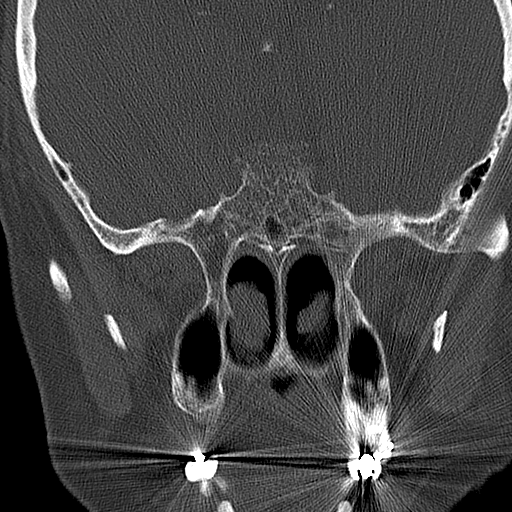
[im 3/10  bone]
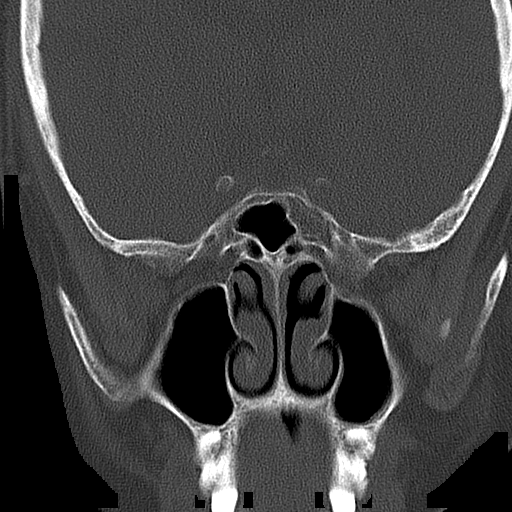
[im 4/10  bone]
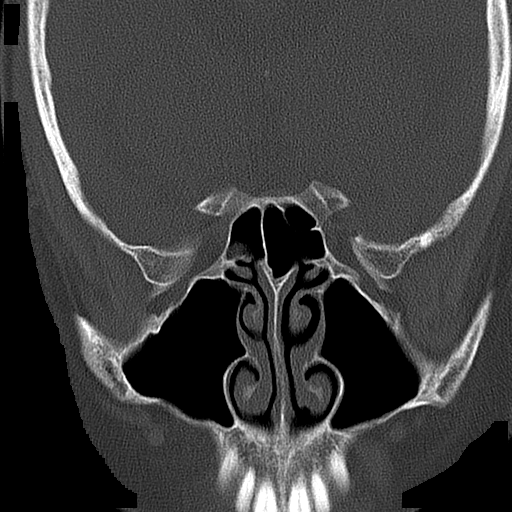
[im 5/10  bone]
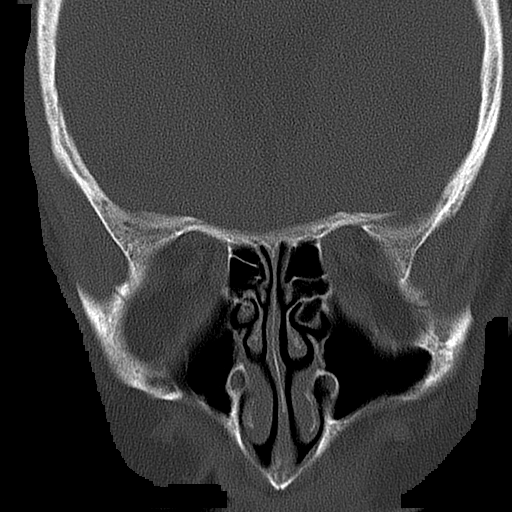
[im 6/10  brain]
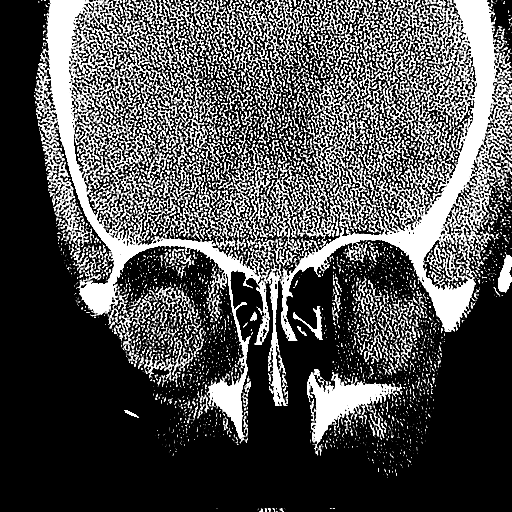
[im 6/10  bone]
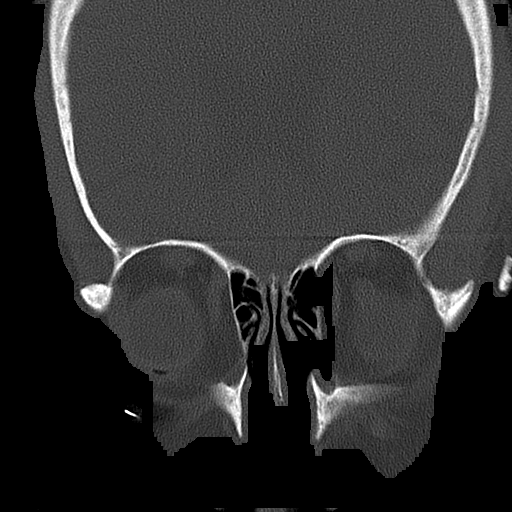
[im 7/10  bone]
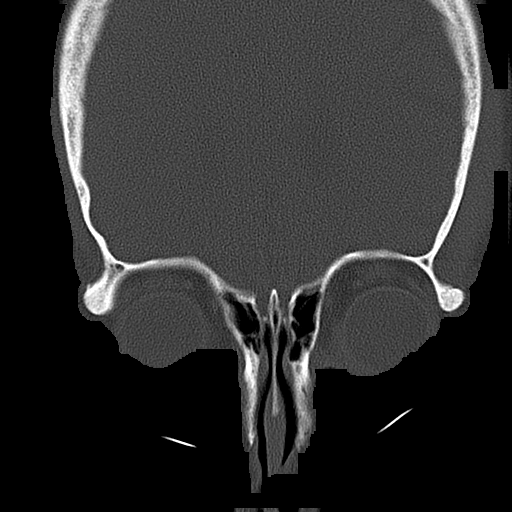
[im 8/10  bone]
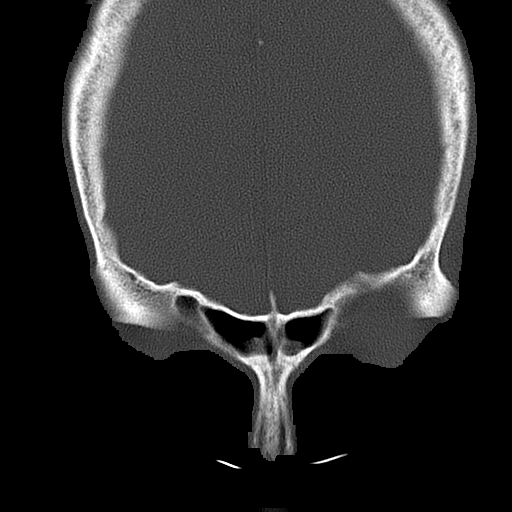
[im 9/10  bone]
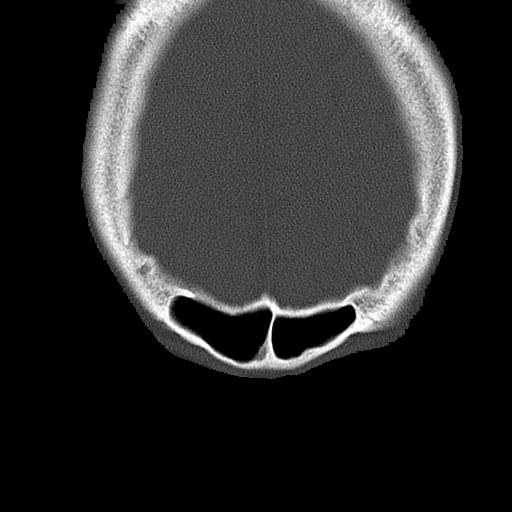

[Series 5: limited sinus st · axial · 0.29mm/px · z∈[+132,+202]mm · 8 of 10 slices shown]
[im 2/10  bone]
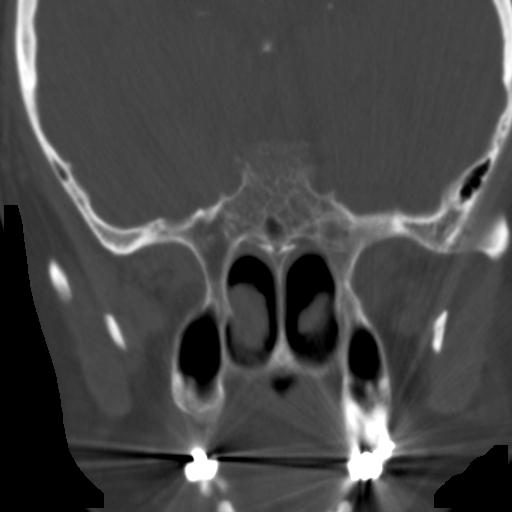
[im 3/10  bone]
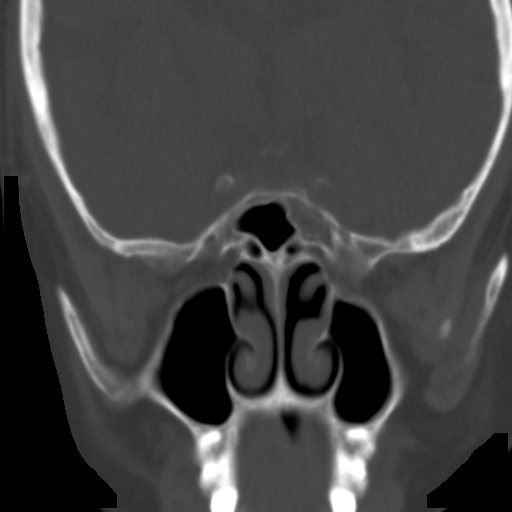
[im 4/10  bone]
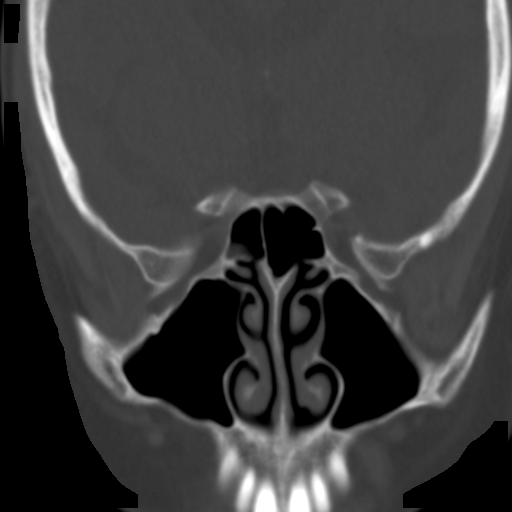
[im 5/10  bone]
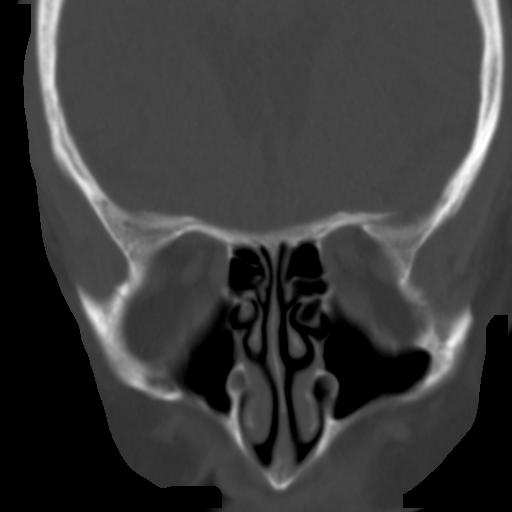
[im 6/10  bone]
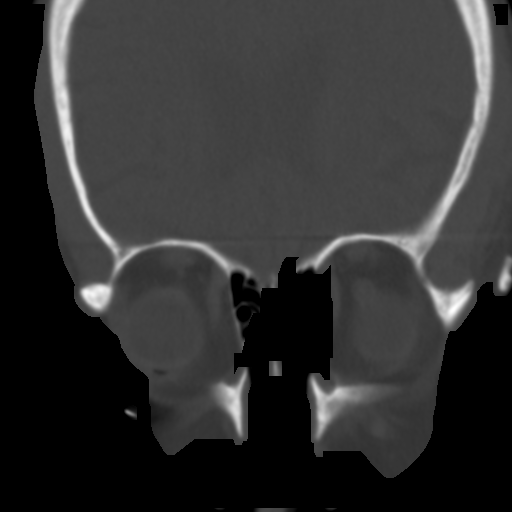
[im 7/10  bone]
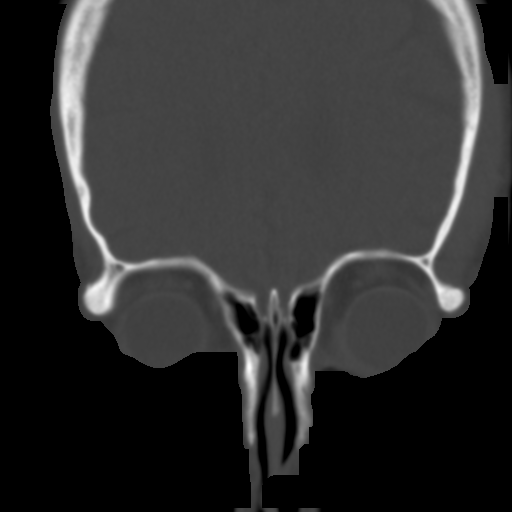
[im 8/10  bone]
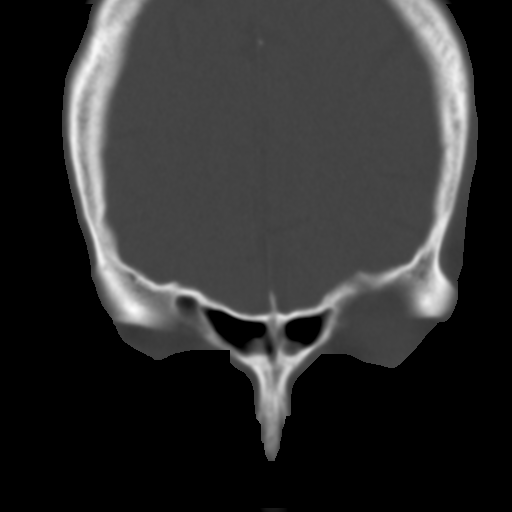
[im 9/10  bone]
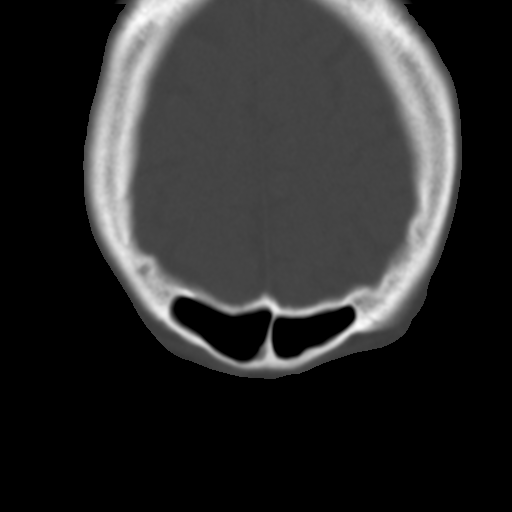

[16 of 22 positions shown; findings below may reference images not displayed]

FINDINGS: The paranasal sinuses are clear. The nasal septum is essentially in
the midline. No incidental finding in soft tissue windows of the
brain, orbits, and face.
IMPRESSION: Negative for sinusitis.

## 2021-10-18 MED ORDER — DOXYCYCLINE HYCLATE 100 MG PO TABS: 100.0000 mg | ORAL_TABLET | Freq: Two times a day (BID) | ORAL | 0 refills | Status: AC

## 2021-10-20 DIAGNOSIS — N2581 Secondary hyperparathyroidism of renal origin: Secondary | ICD-10-CM | POA: Diagnosis not present

## 2021-10-20 DIAGNOSIS — N186 End stage renal disease: Secondary | ICD-10-CM | POA: Diagnosis not present

## 2021-10-20 DIAGNOSIS — D631 Anemia in chronic kidney disease: Secondary | ICD-10-CM | POA: Diagnosis not present

## 2021-10-20 DIAGNOSIS — E43 Unspecified severe protein-calorie malnutrition: Secondary | ICD-10-CM | POA: Diagnosis not present

## 2021-10-20 DIAGNOSIS — E8779 Other fluid overload: Secondary | ICD-10-CM | POA: Diagnosis not present

## 2021-10-20 DIAGNOSIS — D509 Iron deficiency anemia, unspecified: Secondary | ICD-10-CM | POA: Diagnosis not present

## 2021-10-22 ENCOUNTER — Ambulatory Visit: Payer: Medicare Other

## 2021-10-22 ENCOUNTER — Ambulatory Visit (INDEPENDENT_AMBULATORY_CARE_PROVIDER_SITE_OTHER): Payer: Medicare Other | Admitting: Family Medicine

## 2021-10-22 ENCOUNTER — Encounter: Payer: Self-pay | Admitting: Family Medicine

## 2021-10-22 VITALS — BP 166/67 | HR 63 | Temp 97.2°F | Ht 70.0 in | Wt 194.0 lb

## 2021-10-22 DIAGNOSIS — E1022 Type 1 diabetes mellitus with diabetic chronic kidney disease: Secondary | ICD-10-CM

## 2021-10-22 DIAGNOSIS — Z992 Dependence on renal dialysis: Secondary | ICD-10-CM | POA: Diagnosis not present

## 2021-10-22 DIAGNOSIS — L723 Sebaceous cyst: Secondary | ICD-10-CM

## 2021-10-22 DIAGNOSIS — Z9181 History of falling: Secondary | ICD-10-CM

## 2021-10-22 DIAGNOSIS — E103551 Type 1 diabetes mellitus with stable proliferative diabetic retinopathy, right eye: Secondary | ICD-10-CM | POA: Diagnosis not present

## 2021-10-22 DIAGNOSIS — E039 Hypothyroidism, unspecified: Secondary | ICD-10-CM | POA: Diagnosis not present

## 2021-10-22 DIAGNOSIS — N186 End stage renal disease: Secondary | ICD-10-CM

## 2021-10-22 DIAGNOSIS — R630 Anorexia: Secondary | ICD-10-CM

## 2021-10-22 DIAGNOSIS — Z23 Encounter for immunization: Secondary | ICD-10-CM | POA: Diagnosis not present

## 2021-10-22 DIAGNOSIS — N1832 Chronic kidney disease, stage 3b: Secondary | ICD-10-CM

## 2021-10-22 DIAGNOSIS — E103552 Type 1 diabetes mellitus with stable proliferative diabetic retinopathy, left eye: Secondary | ICD-10-CM | POA: Diagnosis not present

## 2021-10-22 DIAGNOSIS — M6281 Muscle weakness (generalized): Secondary | ICD-10-CM

## 2021-10-22 DIAGNOSIS — K3184 Gastroparesis: Secondary | ICD-10-CM | POA: Diagnosis not present

## 2021-10-22 MED ORDER — DRONABINOL 2.5 MG PO CAPS: 2.5000 mg | ORAL_CAPSULE | Freq: Two times a day (BID) | ORAL | 3 refills | Status: AC

## 2021-10-22 NOTE — Therapy (Signed)
OUTPATIENT PHYSICAL THERAPY LOWER EXTREMITY EVALUATION   Patient Name: Patrick Brown MRN: [id number] DOB:[birth year], 51 y.o., male Today's Date: 10/22/2021   PT End of Session - 10/22/21 0905     Visit Number 6    Number of Visits 12    Date for PT Re-Evaluation 11/30/21    PT Start Time 0900    PT Stop Time 4098    PT Time Calculation (min) 42 min    Activity Tolerance Patient tolerated treatment well    Behavior During Therapy [facility] for tasks assessed/performed             Past Medical History:  Diagnosis Date   Anemia, iron deficiency On procrit   CAP (community acquired pneumonia)    CKD (chronic kidney disease) stage 3, GFR 30-59 ml/min (HCC)    Degenerative arthritis    Depression    Dyslipidemia    Gastroesophageal reflux disease    Gastroparesis diabeticorum ([place])    Hematuria, microscopic 10/09   work up negative (Dr. Amalia Hailey)   HIV positive ([place])    Hyperkalemia, diminished renal excretion 06/2011 secondary to TMP/SMZ; prior secondary to  ARBS;    Known potassium excretory defect; history of recurrent hyperkalemia due to diabetic renal disease; ACE/ARB contraindicated; hyperkalemia 06/2011 secondary to TMP-SMZ   Hypothyroidism    IDDM (insulin dependent diabetes mellitus)    38 years   Low HDL (under 40)    Proteinuria    Retinopathy    x2   SIRS (systemic inflammatory response syndrome) ([place])    Past Surgical History:  Procedure Laterality Date   CATARACT EXTRACTION Right    COLONOSCOPY     ESOPHAGOGASTRODUODENOSCOPY     EYE SURGERY  2006,2001   x2    HIP ARTHROPLASTY Right 05/10/2016   Procedure: RIGHT HIP HEMIARTHROPLASTY;  Surgeon: Marchia Bond, MD;  Location: [facility];  Service: Orthopedics;  Laterality: Right;   insulin pump     LASIK Bilateral    VIDEO BRONCHOSCOPY Bilateral 12/15/2012   Procedure: VIDEO BRONCHOSCOPY WITH FLUORO;  Surgeon: Kathee Delton, MD;  [facility];  Service: Cardiopulmonary;  Laterality: Bilateral;   VITRECTOMY   bilateral   Patient Active Problem List   Diagnosis Date Noted   PCO (posterior capsular opacification), right 07/30/2021   Mild episode of recurrent major depressive disorder ([place]) 04/04/2021   Chronic idiopathic constipation 03/23/2021   Asthmatic bronchitis 03/05/2021   Stable treated proliferative diabetic retinopathy of right eye determined by examination associated with type 1 diabetes mellitus ([place]) 11/01/2019   Stable treated proliferative diabetic retinopathy of left eye without macular edema determined by examination associated with type 1 diabetes mellitus ([place]) 11/01/2019   Exocrine pancreatic insufficiency 09/29/2019   ESRD (end stage renal disease) on dialysis ([place]) 09/09/2019   Elevated SGOT 07/14/2019   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[place]) 07/14/2019   Anxiety 03/17/2019   Non-seasonal allergic rhinitis due to pollen 11/06/2016   Benign prostatic hyperplasia without lower urinary tract symptoms 04/18/2016   Vitamin D deficiency 04/18/2016   OSA (obstructive sleep apnea) 10/06/2014   Pseudophakia of both eyes 06/23/2013   CAP (community acquired pneumonia) 08/05/2012   HIV disease ([place]) 06/02/2012   Convulsions/seizures ([place]) 05/16/2012   Gastroparesis 04/18/2012   Anemia, iron deficiency    Type 1 diabetes mellitus ([place]) 05/08/2011   Acquired hypothyroidism 05/08/2011   REFERRING PROVIDER: Dettinger, Fransisca Kaufmann, MD  REFERRING DIAG: Acute pain of right knee; Recurrent falls  THERAPY DIAG:  Muscle weakness (generalized)  History of falling  Rationale  for Evaluation and Treatment Rehabilitation  ONSET DATE: 3-4 weeks ago  SUBJECTIVE:   SUBJECTIVE STATEMENT: Pt reports not feeling well today.  Denies any pain, but reports generalized weakness.   PERTINENT HISTORY: DM, history of seizures, depression, HTN  PAIN:  Are you having pain? No  PRECAUTIONS: Fall  WEIGHT BEARING RESTRICTIONS No  FALLS:  Has patient fallen in last 6 months? Yes. Number of falls 3 with the  most recent being 7-10 days ago  LIVING ENVIRONMENT: Lives with: lives with their family Lives in: House/apartment Stairs: Yes: External: 1 steps; on left going up Has following equipment at home: Quad cane small base  OCCUPATION: disabled  PLOF: Independent  PATIENT GOALS improved strength and safety, navigate at least 1 step to get into his house   OBJECTIVE:   COGNITION:  Overall cognitive status: Within functional limits for tasks assessed     SENSATION: Patient reports no numbness or tingling  LOWER EXTREMITY ROM:  Active ROM Right eval Left eval  Hip flexion    Hip extension    Hip abduction    Hip adduction    Hip internal rotation    Hip external rotation    Knee flexion 113 114  Knee extension 8 with pulling along lateral calf 7  Ankle dorsiflexion    Ankle plantarflexion    Ankle inversion    Ankle eversion     (Blank rows = not tested)  LOWER EXTREMITY MMT:  MMT Right eval Left eval  Hip flexion 4-/5 4-/5  Hip extension    Hip abduction    Hip adduction    Hip internal rotation    Hip external rotation    Knee flexion 4/5 4-/5  Knee extension 4-/5 4-/5  Ankle dorsiflexion 3/5 3/5  Ankle plantarflexion    Ankle inversion    Ankle eversion     (Blank rows = not tested)  FUNCTIONAL TESTS:  5 times sit to stand: 25.11 seconds with UE support from armrests  Timed up and go (TUG): 27.03 seconds with cane   GAIT: Assistive device utilized: Quad cane small base Level of assistance: Modified independence Comments: carried cane while walking, shuffling pattern  TRANSFERS:  Sit to stand: required UE support and increased time  TODAY'S TREATMENT:                                   9/18 EXERCISE LOG  Exercise Repetitions and Resistance Comments  Nustep Lvl 4 x 15 mins   Rows Red t-band x 20 reps   Extension Red t-band x 20 reps   Horizontal Abduction Red t-band x 20 reps   Forward Step Ups 4" step x 20 reps BLE, BUE support   LAQ 4# x 25  reps BLE   Seated Marches 4# x 25 reps BLE   Ball Squeezes x3 mins     Hip Abduction Green t-band x 3 mins   Ham Curls Green t-band x 20 reps BLE   Heel/Toe Raises  X20 reps each (seated)   STS     Blank cell = exercise not performed today    PATIENT EDUCATION:  Education details: HEP, prognosis, POC Person educated: Patient Education method: Explanation Education comprehension: verbalized understanding   HOME EXERCISE PROGRAM: [id number]  ASSESSMENT:  CLINICAL IMPRESSION: Pt arrives for today's treatment session denying any pain, but does report increased weakness and fatigue upon arriving for today's treatment session.  Today's  session concentrated on seated exercises due to increased weakness.  Pt able to tolerated increased reps with several exercises today, but requires consistent cues to stay on task and complete all reps. Pt denied any pain at completion of today's treatment session, but does endorse increased fatigue.     OBJECTIVE IMPAIRMENTS Abnormal gait, decreased activity tolerance, decreased balance, decreased mobility, difficulty walking, decreased ROM, and decreased strength.   ACTIVITY LIMITATIONS squatting, stairs, transfers, and locomotion level  PARTICIPATION LIMITATIONS: driving, shopping, community activity, and yard work  PERSONAL FACTORS Fitness, Transportation, and 3+ comorbidities: DM, history of seizures, depression, HTN  are also affecting patient's functional outcome.   REHAB POTENTIAL: Fair    CLINICAL DECISION MAKING: Unstable/unpredictable  EVALUATION COMPLEXITY: High   GOALS: Goals reviewed with patient? Yes  SHORT TERM GOALS: Target date: 10/17/2021  Patient will be independent with his initial HEP.  Baseline: Goal status: IN PROGRESS   2.  Patient will be able to improve his TUG time to 20 seconds or less.  Baseline:  Goal status: IN PROGRESS  3.  Patient will be able to improve his five time sit to stand time to 20 seconds or less  with upper extremity support.  Baseline:  Goal status: IN PROGRESS  4.  Patient will be able to ambulate with proper utilization of his cane.  Baseline:  Goal status: IN PROGRESS  LONG TERM GOALS: Target date: 11/07/2021   Patient will be independent with his advanced HEP.  Baseline:  Goal status: IN PROGRESS  2.  Patient will be able to independently navigate at least 1 step with 1 railing for improved ease of household navigation.  Baseline:  Goal status: IN PROGRESS  3.  Patient will be able to improve his TUG time to 15 seconds or less.  Baseline:  Goal status: IN PROGRESS  4.   Patient will be able to improve his five time sit to stand time to 15 seconds or less with upper extremity support.  Baseline:  Goal status: IN PROGRESS  PLAN: PT FREQUENCY: 2x/week  PT DURATION: 6 weeks  PLANNED INTERVENTIONS: Therapeutic exercises, Therapeutic activity, Neuromuscular re-education, Balance training, Gait training, Patient/Family education, Self Care, Joint mobilization, Stair training, and Re-evaluation  PLAN FOR NEXT SESSION: nustep, lower extremity strengthening, review HEP, and balance interventions   Kathrynn Ducking, PTA 10/22/2021, 9:48 AM

## 2021-10-22 NOTE — Progress Notes (Signed)
BP (!) 166/67   Pulse 63   Temp (!) 97.2 F (36.2 C)   Ht '5\' 10"'$  (1.778 m)   Wt 194 lb (88 kg)   SpO2 96%   BMI 27.84 kg/m    Subjective:   Patient ID: Patrick Brown, male    DOB: [birth year], 51 y.o.   MRN: [id number]  HPI: Patrick Brown is a 51 y.o. male presenting on 10/22/2021 for Medical Management of Chronic Issues, Diabetes, and Hypothyroidism   HPI Type 1 diabetes mellitus Patient comes in today for recheck of his diabetes. Patient has been currently taking Lantus and Humalog. Patient is not currently on an ACE inhibitor/ARB. Patient has not seen an ophthalmologist this year. Patient denies any new issues with their feet. The symptom started onset as an adult retinopathy and end-stage renal disease and hypothyroidism ARE RELATED TO DM   Hypothyroidism recheck Patient is coming in for thyroid recheck today as well. They deny any issues with hair changes or heat or cold problems or diarrhea or constipation. They deny any chest pain or palpitations. They are currently on levothyroxine 300 micrograms   Right axillary boil Patient is calling complaining of a right axillary boil today.  He has been having problems with it for the past week and a half.  He has been on antibiotics for 9 days now and per his mom the sizes come down but he still having some thick drainage when she expresses it and still tender.  He denies any fevers or chills.  He says it is very tender.  Appetite Patient is still having difficulties with appetite despite being on Reglan and mirtazapine does not seem to be helping.  He is down about 8 pounds in the past couple months.  Relevant past medical, surgical, family and social history reviewed and updated as indicated. Interim medical history since our last visit reviewed. Allergies and medications reviewed and updated.  Review of Systems  Constitutional:  Negative for chills and fever.  Eyes:  Negative for visual disturbance.  Respiratory:  Negative for  shortness of breath and wheezing.   Cardiovascular:  Negative for chest pain and leg swelling.  Musculoskeletal:  Negative for back pain and gait problem.  Skin:  Negative for rash.  Neurological:  Negative for dizziness and light-headedness.  All other systems reviewed and are negative.   Per HPI unless specifically indicated above   Allergies as of 10/22/2021       Reactions   Sulfa Antibiotics Other (See Comments)   High potassium   Ramipril Cough   Versed [midazolam] Other (See Comments)   "I don't wake up very good or clear it out of my system"        Medication List        Accurate as of October 22, 2021 11:44 AM. If you have any questions, ask your nurse or doctor.          acetaminophen 500 MG tablet Commonly known as: TYLENOL Take 1,000 mg by mouth 2 (two) times daily.   acyclovir 400 MG tablet Commonly known as: ZOVIRAX Takes mondays, wednesdays and fridays   AgaMatrix Ultra-Thin Lancets Misc TEST BS 4 TIMES A DAY AND AS NEEDED DX E10.65   albuterol (2.5 MG/3ML) 0.083% nebulizer solution Commonly known as: PROVENTIL Take 3 mLs (2.5 mg total) by nebulization every 6 (six) hours as needed for wheezing or shortness of breath.   albuterol 108 (90 Base) MCG/ACT inhaler Commonly known as: VENTOLIN HFA Inhale 2 puffs  into the lungs every 6 (six) hours as needed for wheezing or shortness of breath.   amLODipine 10 MG tablet Commonly known as: NORVASC Take 5 mg by mouth daily.   aspirin 81 MG chewable tablet Chew 81 mg by mouth every morning.   benzonatate 200 MG capsule Commonly known as: TESSALON Take 1 capsule (200 mg total) by mouth 3 (three) times daily as needed for cough.   clopidogrel 75 MG tablet Commonly known as: PLAVIX Take 40 mg by mouth daily. Take daily   cyclobenzaprine 10 MG tablet Commonly known as: FLEXERIL Take 1 tablet (10 mg total) by mouth 3 (three) times daily as needed for muscle spasms.   diclofenac Sodium 1 %  Gel Commonly known as: VOLTAREN Apply 2 g topically 4 (four) times daily.   diphenoxylate-atropine 2.5-0.025 MG tablet Commonly known as: LOMOTIL TAKE 1 TABLET BY MOUTH 4 (FOUR) TIMES DAILY AS NEEDED FOR DIARRHEA OR LOOSE STOOLS.   Dovato 50-300 MG tablet Generic drug: dolutegravir-lamiVUDine TAKE 1 TABLET BY MOUTH DAILY   doxazosin 1 MG tablet Commonly known as: CARDURA Take 1 mg by mouth daily.   doxycycline 100 MG tablet Commonly known as: VIBRA-TABS Take 1 tablet (100 mg total) by mouth 2 (two) times daily for 5 days. 1 po bid   dronabinol 2.5 MG capsule Commonly known as: MARINOL Take 1 capsule (2.5 mg total) by mouth 2 (two) times daily before lunch and supper. Started by: Worthy Rancher, MD   DULoxetine 60 MG capsule Commonly known as: CYMBALTA Take 1 capsule (60 mg total) by mouth daily. Take with 30 mg fot a total of '90mg'$    DULoxetine 30 MG capsule Commonly known as: CYMBALTA Take 1 capsule (30 mg total) by mouth daily. TAKE WITH THE '60MG'$  FOR TOTAL OF '90MG'$    febuxostat 40 MG tablet Commonly known as: ULORIC Take 1 tablet (40 mg total) by mouth daily.   ferrous sulfate 325 (65 FE) MG tablet Take 650 mg by mouth daily with breakfast.   fluticasone 50 MCG/ACT nasal spray Commonly known as: FLONASE SPRAY 2 SPRAYS INTO EACH NOSTRIL EVERY DAY   furosemide 40 MG tablet Commonly known as: LASIX Take 40 mg by mouth daily. 40 mg nightly   GLUCOSAMINE 1500 COMPLEX PO Take 1 tablet by mouth 2 (two) times daily.   glucosamine-chondroitin 500-400 MG tablet Take by mouth.   glucose blood test strip Commonly known as: OneTouch Verio TEST BLOOD SUGAR 4 TIMES DAILY AND AS NEEDED   hydrALAZINE 100 MG tablet Commonly known as: APRESOLINE Take by mouth.   HYDROcodone bit-homatropine 5-1.5 MG/5ML syrup Commonly known as: HYCODAN Take 5 mLs by mouth every 6 (six) hours as needed for cough.   hyoscyamine 0.125 MG tablet Commonly known as: LEVSIN TAKE 1 TABLET  (0.125 MG TOTAL) BY MOUTH EVERY 4 (FOUR) HOURS AS NEEDED.   icosapent Ethyl 1 g capsule Commonly known as: VASCEPA TAKE 2 CAPSULES BY MOUTH TWICE A DAY   insulin glargine 100 UNIT/ML injection Commonly known as: LANTUS In the event of insulin pump failure, inject 8 units twice daily   insulin lispro 100 UNIT/ML injection Commonly known as: HumaLOG USE 42 UNITS TO 120 UNITS PER PUMP DAILY AS DIRECTED   ipratropium 0.03 % nasal spray Commonly known as: ATROVENT USE 2 SPRAYS IN EACH NOSTRIL 2-3 TIMES DAILY   levocetirizine 5 MG tablet Commonly known as: XYZAL TAKE 1 TABLET BY MOUTH EVERY DAY IN THE EVENING   levothyroxine 300 MCG tablet Commonly known  as: SYNTHROID Take 1 tablet (300 mcg total) by mouth daily.   lipase/protease/amylase 12000-38000 units Cpep capsule Commonly known as: CREON Take 2 capsules prior to meals and 1 capsule prior to snacks   LORazepam 0.5 MG tablet Commonly known as: ATIVAN Take by mouth.   losartan 100 MG tablet Commonly known as: COZAAR Take 100 mg by mouth daily.   meclizine 12.5 MG tablet Commonly known as: ANTIVERT Take 1 tablet (12.5 mg total) by mouth 3 (three) times daily as needed for dizziness.   metoCLOPramide 5 MG tablet Commonly known as: REGLAN Take 5 mg by mouth 4 (four) times daily.   metoprolol succinate 50 MG 24 hr tablet Commonly known as: TOPROL-XL Take 50 mg by mouth 2 (two) times daily.   mirtazapine 30 MG tablet Commonly known as: REMERON Take 1 tablet (30 mg total) by mouth at bedtime.   Misc Intestinal Flora Regulat Caps Take 1 capsule by mouth every morning.   montelukast 10 MG tablet Commonly known as: SINGULAIR TAKE 1 TABLET BY MOUTH EVERYDAY AT BEDTIME   niacin 1000 MG CR tablet Commonly known as: NIASPAN TAKE 1 TABLET (1,000 MG TOTAL) BY MOUTH AT BEDTIME.   ondansetron 4 MG tablet Commonly known as: ZOFRAN Take 1 tablet (4 mg total) by mouth every 8 (eight) hours as needed. for nausea    pantoprazole 40 MG tablet Commonly known as: PROTONIX Take 40 mg by mouth 2 (two) times daily.   promethazine 25 MG suppository Commonly known as: PHENERGAN PLACE 1 SUPPOSITORY (25 MG TOTAL) RECTALLY EVERY 6 (SIX) HOURS AS NEEDED FOR NAUSEA OR VOMITING.   rosuvastatin 20 MG tablet Commonly known as: CRESTOR Take 1 tablet (20 mg total) by mouth at bedtime.   sevelamer carbonate 800 MG tablet Commonly known as: RENVELA Take 800 mg by mouth 3 (three) times daily. 2 tabs before each meal   sucralfate 1 g tablet Commonly known as: CARAFATE Take 1 tablet (1 g total) by mouth 2 (two) times daily.   tamsulosin 0.4 MG Caps capsule Commonly known as: FLOMAX TAKE 1 CAPSULE BY MOUTH EVERYDAY AT BEDTIME   Testosterone 20.25 MG/ACT (1.62%) Gel APPLY 3 PUMPS DAILY AS DIRECTED   traMADol 50 MG tablet Commonly known as: ULTRAM Take 1 tablet (50 mg total) by mouth daily as needed.   traZODone 50 MG tablet Commonly known as: DESYREL Take by mouth.         Objective:   BP (!) 166/67   Pulse 63   Temp (!) 97.2 F (36.2 C)   Ht '5\' 10"'$  (1.778 m)   Wt 194 lb (88 kg)   SpO2 96%   BMI 27.84 kg/m   Wt Readings from Last 3 Encounters:  10/22/21 194 lb (88 kg)  10/12/21 193 lb 6 oz (87.7 kg)  08/22/21 202 lb (91.6 kg)    Physical Exam Vitals and nursing note reviewed.  Constitutional:      General: He is not in acute distress.    Appearance: He is well-developed. He is not diaphoretic.  Eyes:     General: No scleral icterus.    Conjunctiva/sclera: Conjunctivae normal.  Neck:     Thyroid: No thyromegaly.  Cardiovascular:     Rate and Rhythm: Normal rate and regular rhythm.     Heart sounds: Normal heart sounds. No murmur heard. Pulmonary:     Effort: Pulmonary effort is normal. No respiratory distress.     Breath sounds: Normal breath sounds. No wheezing.  Musculoskeletal:  General: No swelling. Normal range of motion.     Cervical back: Neck supple.   Lymphadenopathy:     Cervical: No cervical adenopathy.  Skin:    General: Skin is warm and dry.     Findings: No rash.  Neurological:     Mental Status: He is alert and oriented to person, place, and time.     Coordination: Coordination normal.  Psychiatric:        Behavior: Behavior normal.       Assessment & Plan:   Problem List Items Addressed This Visit       Digestive   Gastroparesis   Relevant Medications   dronabinol (MARINOL) 2.5 MG capsule     Endocrine   Type 1 diabetes mellitus ([place]) - Primary   Acquired hypothyroidism   Relevant Orders   Thyroid Panel With TSH   Stable treated proliferative diabetic retinopathy of right eye determined by examination associated with type 1 diabetes mellitus ([place])   Stable treated proliferative diabetic retinopathy of left eye without macular edema determined by examination associated with type 1 diabetes mellitus ([place])     Genitourinary   ESRD (end stage renal disease) on dialysis ([place])   Relevant Medications   dronabinol (MARINOL) 2.5 MG capsule   Other Visit Diagnoses     Appetite loss       Relevant Medications   dronabinol (MARINOL) 2.5 MG capsule   Sebaceous cyst of axilla       Relevant Orders   Ambulatory referral to General Surgery     We will try dronabinol to see if covered and see if it can help with appetite stimulant.  Continue other medicines currently.  Recheck thyroid today.  Follow up plan: Return in about 2 months (around 12/22/2021), or if symptoms worsen or fail to improve, for Thyroid recheck.  Counseling provided for all of the vaccine components Orders Placed This Encounter  Procedures   Thyroid Panel With TSH   Ambulatory referral to [place], MD [facility] 10/22/2021, 11:44 AM

## 2021-10-23 ENCOUNTER — Telehealth: Payer: Self-pay | Admitting: *Deleted

## 2021-10-23 DIAGNOSIS — D631 Anemia in chronic kidney disease: Secondary | ICD-10-CM | POA: Diagnosis not present

## 2021-10-23 DIAGNOSIS — D509 Iron deficiency anemia, unspecified: Secondary | ICD-10-CM | POA: Diagnosis not present

## 2021-10-23 DIAGNOSIS — N186 End stage renal disease: Secondary | ICD-10-CM | POA: Diagnosis not present

## 2021-10-23 DIAGNOSIS — E8779 Other fluid overload: Secondary | ICD-10-CM | POA: Diagnosis not present

## 2021-10-23 DIAGNOSIS — E43 Unspecified severe protein-calorie malnutrition: Secondary | ICD-10-CM | POA: Diagnosis not present

## 2021-10-23 DIAGNOSIS — N2581 Secondary hyperparathyroidism of renal origin: Secondary | ICD-10-CM | POA: Diagnosis not present

## 2021-10-23 LAB — THYROID PANEL WITH TSH
Free Thyroxine Index: 2.2 (ref 1.2–4.9)
T3 Uptake Ratio: 27 % (ref 24–39)
T4, Total: 8.2 ug/dL (ref 4.5–12.0)
TSH: 33.8 u[IU]/mL — ABNORMAL HIGH (ref 0.450–4.500)

## 2021-10-23 NOTE — Telephone Encounter (Signed)
Fax from [facility] RE: Dronabinol 2.5 mg cap 1 BID before lunch & supper Note from pharmacy: Alternative requested - Not covered on insurance - OOP cost $310.99 Please advise and send in alternative

## 2021-10-24 ENCOUNTER — Ambulatory Visit: Payer: Medicare Other

## 2021-10-24 ENCOUNTER — Other Ambulatory Visit: Payer: Self-pay | Admitting: Nurse Practitioner

## 2021-10-24 DIAGNOSIS — M6281 Muscle weakness (generalized): Secondary | ICD-10-CM

## 2021-10-24 DIAGNOSIS — Z9181 History of falling: Secondary | ICD-10-CM | POA: Diagnosis not present

## 2021-10-24 NOTE — Therapy (Signed)
OUTPATIENT PHYSICAL THERAPY LOWER EXTREMITY EVALUATION   Patient Name: Patrick Brown MRN: [id number] DOB:11/17/1970, 51 y.o., male Today's Date: 10/24/2021   PT End of Session - 10/24/21 1302     Visit Number 7    Number of Visits 12    Date for PT Re-Evaluation 11/30/21    PT Start Time 1300    PT Stop Time 7829    PT Time Calculation (min) 45 min    Activity Tolerance Patient tolerated treatment well    Behavior During Therapy WFL for tasks assessed/performed             Past Medical History:  Diagnosis Date   Anemia, iron deficiency On procrit   CAP (community acquired pneumonia)    CKD (chronic kidney disease) stage 3, GFR 30-59 ml/min (HCC)    Degenerative arthritis    Depression    Dyslipidemia    Gastroesophageal reflux disease    Gastroparesis diabeticorum ([place])    Hematuria, microscopic 10/09   work up negative (Dr. Amalia Hailey)   HIV positive ([place])    Hyperkalemia, diminished renal excretion 06/2011 secondary to TMP/SMZ; prior secondary to  ARBS;    Known potassium excretory defect; history of recurrent hyperkalemia due to diabetic renal disease; ACE/ARB contraindicated; hyperkalemia 06/2011 secondary to TMP-SMZ   Hypothyroidism    IDDM (insulin dependent diabetes mellitus)    38 years   Low HDL (under 40)    Proteinuria    Retinopathy    x2   SIRS (systemic inflammatory response syndrome) ([place])    Past Surgical History:  Procedure Laterality Date   CATARACT EXTRACTION Right    COLONOSCOPY     ESOPHAGOGASTRODUODENOSCOPY     EYE SURGERY  2006,2001   x2    HIP ARTHROPLASTY Right 05/10/2016   Procedure: RIGHT HIP HEMIARTHROPLASTY;  Surgeon: Marchia Bond, MD;  Location: [facility];  Service: Orthopedics;  Laterality: Right;   insulin pump     LASIK Bilateral    VIDEO BRONCHOSCOPY Bilateral 12/15/2012   Procedure: VIDEO BRONCHOSCOPY WITH FLUORO;  Surgeon: Kathee Delton, MD;  Location: [facility];  Service: Cardiopulmonary;  Laterality: Bilateral;   VITRECTOMY   bilateral   Patient Active Problem List   Diagnosis Date Noted   PCO (posterior capsular opacification), right 07/30/2021   Mild episode of recurrent major depressive disorder ([place]) 04/04/2021   Chronic idiopathic constipation 03/23/2021   Asthmatic bronchitis 03/05/2021   Stable treated proliferative diabetic retinopathy of right eye determined by examination associated with type 1 diabetes mellitus ([place]) 11/01/2019   Stable treated proliferative diabetic retinopathy of left eye without macular edema determined by examination associated with type 1 diabetes mellitus ([place]) 11/01/2019   Exocrine pancreatic insufficiency 09/29/2019   ESRD (end stage renal disease) on dialysis ([place]) 09/09/2019   Elevated SGOT 07/14/2019   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[place]) 07/14/2019   Anxiety 03/17/2019   Non-seasonal allergic rhinitis due to pollen 11/06/2016   Benign prostatic hyperplasia without lower urinary tract symptoms 04/18/2016   Vitamin D deficiency 04/18/2016   OSA (obstructive sleep apnea) 10/06/2014   Pseudophakia of both eyes 06/23/2013   CAP (community acquired pneumonia) 08/05/2012   HIV disease ([place]) 06/02/2012   Convulsions/seizures ([place]) 05/16/2012   Gastroparesis 04/18/2012   Anemia, iron deficiency    Type 1 diabetes mellitus ([place]) 05/08/2011   Acquired hypothyroidism 05/08/2011   REFERRING PROVIDER: Dettinger, Fransisca Kaufmann, MD  REFERRING DIAG: Acute pain of right knee; Recurrent falls  THERAPY DIAG:  Muscle weakness (generalized)  History of falling  Rationale  for Evaluation and Treatment Rehabilitation  ONSET DATE: 3-4 weeks ago  SUBJECTIVE:   SUBJECTIVE STATEMENT: Pt reports feeling okay today.  Pt reporting right shoulder pain.  PERTINENT HISTORY: DM, history of seizures, depression, HTN  PAIN:  Are you having pain? Yes: NPRS scale: 2/10 Pain location: right shoulder  PRECAUTIONS: Fall  WEIGHT BEARING RESTRICTIONS No  FALLS:  Has patient fallen in last 6 months? Yes.  Number of falls 3 with the most recent being 7-10 days ago  LIVING ENVIRONMENT: Lives with: lives with their family Lives in: House/apartment Stairs: Yes: External: 1 steps; on left going up Has following equipment at home: Quad cane small base  OCCUPATION: disabled  PLOF: Independent  PATIENT GOALS improved strength and safety, navigate at least 1 step to get into his house   OBJECTIVE:   COGNITION:  Overall cognitive status: Within functional limits for tasks assessed     SENSATION: Patient reports no numbness or tingling  LOWER EXTREMITY ROM:  Active ROM Right eval Left eval  Hip flexion    Hip extension    Hip abduction    Hip adduction    Hip internal rotation    Hip external rotation    Knee flexion 113 114  Knee extension 8 with pulling along lateral calf 7  Ankle dorsiflexion    Ankle plantarflexion    Ankle inversion    Ankle eversion     (Blank rows = not tested)  LOWER EXTREMITY MMT:  MMT Right eval Left eval  Hip flexion 4-/5 4-/5  Hip extension    Hip abduction    Hip adduction    Hip internal rotation    Hip external rotation    Knee flexion 4/5 4-/5  Knee extension 4-/5 4-/5  Ankle dorsiflexion 3/5 3/5  Ankle plantarflexion    Ankle inversion    Ankle eversion     (Blank rows = not tested)  FUNCTIONAL TESTS:  5 times sit to stand: 25.11 seconds with UE support from armrests  Timed up and go (TUG): 27.03 seconds with cane   GAIT: Assistive device utilized: Quad cane small base Level of assistance: Modified independence Comments: carried cane while walking, shuffling pattern  TRANSFERS:  Sit to stand: required UE support and increased time  TODAY'S TREATMENT:                                   9/20 EXERCISE LOG  Exercise Repetitions and Resistance Comments  Nustep Lvl 4 x 15 mins   Rows    Extension    Horizontal Abduction    Forward Step Ups 4" step x 20 reps BLE, BUE support   LAQ 4# x 25 reps BLE   Seated Marches 4# x  25 reps BLE   Ball Squeezes x3 mins     Hip Abduction Green t-band x 3 mins   Ham Curls Green t-band x 20 reps BLE   Heel/Toe Raises  X20 reps each (standing)   STS     Blank cell = exercise not performed today    PATIENT EDUCATION:  Education details: HEP, prognosis, POC Person educated: Patient Education method: Explanation Education comprehension: verbalized understanding   HOME EXERCISE PROGRAM: [id number]  ASSESSMENT:  CLINICAL IMPRESSION: Pt arrives for today's treatment session reporting 2/10 right shoulder pain, but reports feeling okay otherwise.  Pt has met all of his short term goals at this time and is making good  progress towards all of his long term goals.  Pt encouraged by progress and looks forward to coming visits.     OBJECTIVE IMPAIRMENTS Abnormal gait, decreased activity tolerance, decreased balance, decreased mobility, difficulty walking, decreased ROM, and decreased strength.   ACTIVITY LIMITATIONS squatting, stairs, transfers, and locomotion level  PARTICIPATION LIMITATIONS: driving, shopping, community activity, and yard work  PERSONAL FACTORS Fitness, Transportation, and 3+ comorbidities: DM, history of seizures, depression, HTN  are also affecting patient's functional outcome.   REHAB POTENTIAL: Fair    CLINICAL DECISION MAKING: Unstable/unpredictable  EVALUATION COMPLEXITY: High   GOALS: Goals reviewed with patient? Yes  SHORT TERM GOALS: Target date: 10/17/2021  Patient will be independent with his initial HEP.  Baseline: Goal status: MET   2.  Patient will be able to improve his TUG time to 20 seconds or less.  Baseline: 19.8 secs with UE support Goal status: MET  3.  Patient will be able to improve his five time sit to stand time to 20 seconds or less with upper extremity support.  Baseline: 18.5 secs no cane Goal status: MET  4.  Patient will be able to ambulate with proper utilization of his cane.  Baseline:  Goal status:  MET  LONG TERM GOALS: Target date: 11/07/2021   Patient will be independent with his advanced HEP.  Baseline:  Goal status: IN PROGRESS  2.  Patient will be able to independently navigate at least 1 step with 1 railing for improved ease of household navigation.  Baseline:  Goal status: IN PROGRESS  3.  Patient will be able to improve his TUG time to 15 seconds or less.  Baseline:  Goal status: IN PROGRESS  4.   Patient will be able to improve his five time sit to stand time to 15 seconds or less with upper extremity support.  Baseline:  Goal status: IN PROGRESS  PLAN: PT FREQUENCY: 2x/week  PT DURATION: 6 weeks  PLANNED INTERVENTIONS: Therapeutic exercises, Therapeutic activity, Neuromuscular re-education, Balance training, Gait training, Patient/Family education, Self Care, Joint mobilization, Stair training, and Re-evaluation  PLAN FOR NEXT SESSION: nustep, lower extremity strengthening, review HEP, and balance interventions   Kathrynn Ducking, PTA 10/24/2021, 1:52 PM

## 2021-10-24 NOTE — Telephone Encounter (Signed)
Have patient contact his gastroenterologist for alternatives

## 2021-10-24 NOTE — Telephone Encounter (Signed)
Patient aware and verbalizes understanding. 

## 2021-10-25 DIAGNOSIS — D509 Iron deficiency anemia, unspecified: Secondary | ICD-10-CM | POA: Diagnosis not present

## 2021-10-25 DIAGNOSIS — E8779 Other fluid overload: Secondary | ICD-10-CM | POA: Diagnosis not present

## 2021-10-25 DIAGNOSIS — D631 Anemia in chronic kidney disease: Secondary | ICD-10-CM | POA: Diagnosis not present

## 2021-10-25 DIAGNOSIS — E43 Unspecified severe protein-calorie malnutrition: Secondary | ICD-10-CM | POA: Diagnosis not present

## 2021-10-25 DIAGNOSIS — N186 End stage renal disease: Secondary | ICD-10-CM | POA: Diagnosis not present

## 2021-10-25 DIAGNOSIS — N2581 Secondary hyperparathyroidism of renal origin: Secondary | ICD-10-CM | POA: Diagnosis not present

## 2021-10-27 DIAGNOSIS — N2581 Secondary hyperparathyroidism of renal origin: Secondary | ICD-10-CM | POA: Diagnosis not present

## 2021-10-27 DIAGNOSIS — E43 Unspecified severe protein-calorie malnutrition: Secondary | ICD-10-CM | POA: Diagnosis not present

## 2021-10-27 DIAGNOSIS — D509 Iron deficiency anemia, unspecified: Secondary | ICD-10-CM | POA: Diagnosis not present

## 2021-10-27 DIAGNOSIS — E8779 Other fluid overload: Secondary | ICD-10-CM | POA: Diagnosis not present

## 2021-10-27 DIAGNOSIS — N186 End stage renal disease: Secondary | ICD-10-CM | POA: Diagnosis not present

## 2021-10-27 DIAGNOSIS — D631 Anemia in chronic kidney disease: Secondary | ICD-10-CM | POA: Diagnosis not present

## 2021-10-29 ENCOUNTER — Ambulatory Visit: Payer: Medicare Other

## 2021-10-29 DIAGNOSIS — M6281 Muscle weakness (generalized): Secondary | ICD-10-CM | POA: Diagnosis not present

## 2021-10-29 DIAGNOSIS — Z9181 History of falling: Secondary | ICD-10-CM

## 2021-10-29 NOTE — Therapy (Signed)
OUTPATIENT PHYSICAL THERAPY LOWER EXTREMITY EVALUATION   Patient Name: Patrick Brown MRN: [id number] DOB:03-Apr-1970, 51 y.o., male Today's Date: 10/29/2021   PT End of Session - 10/29/21 1303     Visit Number 8    Number of Visits 12    Date for PT Re-Evaluation 11/30/21    PT Start Time 1300    PT Stop Time 4503    PT Time Calculation (min) 45 min    Activity Tolerance Patient tolerated treatment well    Behavior During Therapy WFL for tasks assessed/performed             Past Medical History:  Diagnosis Date   Anemia, iron deficiency On procrit   CAP (community acquired pneumonia)    CKD (chronic kidney disease) stage 3, GFR 30-59 ml/min (HCC)    Degenerative arthritis    Depression    Dyslipidemia    Gastroesophageal reflux disease    Gastroparesis diabeticorum ([place])    Hematuria, microscopic 10/09   work up negative (Dr. Amalia Hailey)   HIV positive ([place])    Hyperkalemia, diminished renal excretion 06/2011 secondary to TMP/SMZ; prior secondary to  ARBS;    Known potassium excretory defect; history of recurrent hyperkalemia due to diabetic renal disease; ACE/ARB contraindicated; hyperkalemia 06/2011 secondary to TMP-SMZ   Hypothyroidism    IDDM (insulin dependent diabetes mellitus)    38 years   Low HDL (under 40)    Proteinuria    Retinopathy    x2   SIRS (systemic inflammatory response syndrome) ([place])    Past Surgical History:  Procedure Laterality Date   CATARACT EXTRACTION Right    COLONOSCOPY     ESOPHAGOGASTRODUODENOSCOPY     EYE SURGERY  2006,2001   x2    HIP ARTHROPLASTY Right 05/10/2016   Procedure: RIGHT HIP HEMIARTHROPLASTY;  Surgeon: Marchia Bond, MD;  Location: [facility];  Service: Orthopedics;  Laterality: Right;   insulin pump     LASIK Bilateral    VIDEO BRONCHOSCOPY Bilateral 12/15/2012   Procedure: VIDEO BRONCHOSCOPY WITH FLUORO;  Surgeon: Kathee Delton, MD;  Location: [facility];  Service: Cardiopulmonary;  Laterality: Bilateral;   VITRECTOMY   bilateral   Patient Active Problem List   Diagnosis Date Noted   PCO (posterior capsular opacification), right 07/30/2021   Mild episode of recurrent major depressive disorder ([place]) 04/04/2021   Chronic idiopathic constipation 03/23/2021   Asthmatic bronchitis 03/05/2021   Stable treated proliferative diabetic retinopathy of right eye determined by examination associated with type 1 diabetes mellitus ([place]) 11/01/2019   Stable treated proliferative diabetic retinopathy of left eye without macular edema determined by examination associated with type 1 diabetes mellitus ([place]) 11/01/2019   Exocrine pancreatic insufficiency 09/29/2019   ESRD (end stage renal disease) on dialysis ([place]) 09/09/2019   Elevated SGOT 07/14/2019   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[place]) 07/14/2019   Anxiety 03/17/2019   Non-seasonal allergic rhinitis due to pollen 11/06/2016   Benign prostatic hyperplasia without lower urinary tract symptoms 04/18/2016   Vitamin D deficiency 04/18/2016   OSA (obstructive sleep apnea) 10/06/2014   Pseudophakia of both eyes 06/23/2013   CAP (community acquired pneumonia) 08/05/2012   HIV disease ([place]) 06/02/2012   Convulsions/seizures ([place]) 05/16/2012   Gastroparesis 04/18/2012   Anemia, iron deficiency    Type 1 diabetes mellitus ([place]) 05/08/2011   Acquired hypothyroidism 05/08/2011   REFERRING PROVIDER: Dettinger, Fransisca Kaufmann, MD  REFERRING DIAG: Acute pain of right knee; Recurrent falls  THERAPY DIAG:  Muscle weakness (generalized)  History of falling  Rationale  for Evaluation and Treatment Rehabilitation  ONSET DATE: 3-4 weeks ago  SUBJECTIVE:   SUBJECTIVE STATEMENT: Pt reports feeling weaker today.  Pt ambulates into the facility without SPC today.   PERTINENT HISTORY: DM, history of seizures, depression, HTN  PAIN:  Are you having pain? No  PRECAUTIONS: Fall  WEIGHT BEARING RESTRICTIONS No  FALLS:  Has patient fallen in last 6 months? Yes. Number of falls 3 with the most  recent being 7-10 days ago  LIVING ENVIRONMENT: Lives with: lives with their family Lives in: House/apartment Stairs: Yes: External: 1 steps; on left going up Has following equipment at home: Quad cane small base  OCCUPATION: disabled  PLOF: Independent  PATIENT GOALS improved strength and safety, navigate at least 1 step to get into his house   OBJECTIVE:   COGNITION:  Overall cognitive status: Within functional limits for tasks assessed     SENSATION: Patient reports no numbness or tingling  LOWER EXTREMITY ROM:  Active ROM Right eval Left eval  Hip flexion    Hip extension    Hip abduction    Hip adduction    Hip internal rotation    Hip external rotation    Knee flexion 113 114  Knee extension 8 with pulling along lateral calf 7  Ankle dorsiflexion    Ankle plantarflexion    Ankle inversion    Ankle eversion     (Blank rows = not tested)  LOWER EXTREMITY MMT:  MMT Right eval Left eval  Hip flexion 4-/5 4-/5  Hip extension    Hip abduction    Hip adduction    Hip internal rotation    Hip external rotation    Knee flexion 4/5 4-/5  Knee extension 4-/5 4-/5  Ankle dorsiflexion 3/5 3/5  Ankle plantarflexion    Ankle inversion    Ankle eversion     (Blank rows = not tested)  FUNCTIONAL TESTS:  5 times sit to stand: 25.11 seconds with UE support from armrests  Timed up and go (TUG): 27.03 seconds with cane   GAIT: Assistive device utilized: Quad cane small base Level of assistance: Modified independence Comments: carried cane while walking, shuffling pattern  TRANSFERS:  Sit to stand: required UE support and increased time  TODAY'S TREATMENT:                                   9/25 EXERCISE LOG  Exercise Repetitions and Resistance Comments  Nustep Lvl 4 x 15 mins   Rows    Extension    Horizontal Abduction    Forward Step Ups 6" step x 20 reps BLE, BUE support   LAQ 5# x 25 reps BLE   Seated Marches 5# x 25 reps BLE   Ball Squeezes x3  mins     Hip Abduction Green t-band x 3 mins   Ham Curls Green t-band x 20 reps BLE   Heel/Toe Raises  X25 reps each (standing)   Rockerboard X3 mins    Blank cell = exercise not performed today    PATIENT EDUCATION:  Education details: HEP, prognosis, POC Person educated: Patient Education method: Explanation Education comprehension: verbalized understanding   HOME EXERCISE PROGRAM: [id number]  ASSESSMENT:  CLINICAL IMPRESSION: Pt arrives for today's treatment session denying any pain.  Pt reports increased fatigue today due to difficult dialysis appointment over the weekend.  Pt able to tolerate increased standing exercises today as well as  increased resistance with seated LAQ and seated marches.  Pt requiring increased rest breaks today due to fatigue.  Pt denied any pain at completion of today's treatment session.    OBJECTIVE IMPAIRMENTS Abnormal gait, decreased activity tolerance, decreased balance, decreased mobility, difficulty walking, decreased ROM, and decreased strength.   ACTIVITY LIMITATIONS squatting, stairs, transfers, and locomotion level  PARTICIPATION LIMITATIONS: driving, shopping, community activity, and yard work  PERSONAL FACTORS Fitness, Transportation, and 3+ comorbidities: DM, history of seizures, depression, HTN  are also affecting patient's functional outcome.   REHAB POTENTIAL: Fair    CLINICAL DECISION MAKING: Unstable/unpredictable  EVALUATION COMPLEXITY: High   GOALS: Goals reviewed with patient? Yes  SHORT TERM GOALS: Target date: 10/17/2021  Patient will be independent with his initial HEP.  Baseline: Goal status: MET   2.  Patient will be able to improve his TUG time to 20 seconds or less.  Baseline: 19.8 secs with UE support Goal status: MET  3.  Patient will be able to improve his five time sit to stand time to 20 seconds or less with upper extremity support.  Baseline: 18.5 secs no cane Goal status: MET  4.  Patient will be  able to ambulate with proper utilization of his cane.  Baseline:  Goal status: MET  LONG TERM GOALS: Target date: 11/07/2021   Patient will be independent with his advanced HEP.  Baseline:  Goal status: IN PROGRESS  2.  Patient will be able to independently navigate at least 1 step with 1 railing for improved ease of household navigation.  Baseline:  Goal status: IN PROGRESS  3.  Patient will be able to improve his TUG time to 15 seconds or less.  Baseline:  Goal status: IN PROGRESS  4.   Patient will be able to improve his five time sit to stand time to 15 seconds or less with upper extremity support.  Baseline:  Goal status: IN PROGRESS  PLAN: PT FREQUENCY: 2x/week  PT DURATION: 6 weeks  PLANNED INTERVENTIONS: Therapeutic exercises, Therapeutic activity, Neuromuscular re-education, Balance training, Gait training, Patient/Family education, Self Care, Joint mobilization, Stair training, and Re-evaluation  PLAN FOR NEXT SESSION: nustep, lower extremity strengthening, review HEP, and balance interventions   Kathrynn Ducking, PTA 10/29/2021, 1:51 PM

## 2021-10-30 DIAGNOSIS — N186 End stage renal disease: Secondary | ICD-10-CM | POA: Diagnosis not present

## 2021-10-30 DIAGNOSIS — D631 Anemia in chronic kidney disease: Secondary | ICD-10-CM | POA: Diagnosis not present

## 2021-10-30 DIAGNOSIS — E8779 Other fluid overload: Secondary | ICD-10-CM | POA: Diagnosis not present

## 2021-10-30 DIAGNOSIS — E43 Unspecified severe protein-calorie malnutrition: Secondary | ICD-10-CM | POA: Diagnosis not present

## 2021-10-30 DIAGNOSIS — N2581 Secondary hyperparathyroidism of renal origin: Secondary | ICD-10-CM | POA: Diagnosis not present

## 2021-10-30 DIAGNOSIS — D509 Iron deficiency anemia, unspecified: Secondary | ICD-10-CM | POA: Diagnosis not present

## 2021-10-31 ENCOUNTER — Ambulatory Visit: Payer: Medicare Other

## 2021-11-01 DIAGNOSIS — E8779 Other fluid overload: Secondary | ICD-10-CM | POA: Diagnosis not present

## 2021-11-01 DIAGNOSIS — D631 Anemia in chronic kidney disease: Secondary | ICD-10-CM | POA: Diagnosis not present

## 2021-11-01 DIAGNOSIS — D509 Iron deficiency anemia, unspecified: Secondary | ICD-10-CM | POA: Diagnosis not present

## 2021-11-01 DIAGNOSIS — N2581 Secondary hyperparathyroidism of renal origin: Secondary | ICD-10-CM | POA: Diagnosis not present

## 2021-11-01 DIAGNOSIS — N186 End stage renal disease: Secondary | ICD-10-CM | POA: Diagnosis not present

## 2021-11-01 DIAGNOSIS — E43 Unspecified severe protein-calorie malnutrition: Secondary | ICD-10-CM | POA: Diagnosis not present

## 2021-11-02 DIAGNOSIS — G4733 Obstructive sleep apnea (adult) (pediatric): Secondary | ICD-10-CM | POA: Diagnosis not present

## 2021-11-03 DIAGNOSIS — D631 Anemia in chronic kidney disease: Secondary | ICD-10-CM | POA: Diagnosis not present

## 2021-11-03 DIAGNOSIS — D509 Iron deficiency anemia, unspecified: Secondary | ICD-10-CM | POA: Diagnosis not present

## 2021-11-03 DIAGNOSIS — Z992 Dependence on renal dialysis: Secondary | ICD-10-CM | POA: Diagnosis not present

## 2021-11-03 DIAGNOSIS — N186 End stage renal disease: Secondary | ICD-10-CM | POA: Diagnosis not present

## 2021-11-03 DIAGNOSIS — E8779 Other fluid overload: Secondary | ICD-10-CM | POA: Diagnosis not present

## 2021-11-03 DIAGNOSIS — N2581 Secondary hyperparathyroidism of renal origin: Secondary | ICD-10-CM | POA: Diagnosis not present

## 2021-11-03 DIAGNOSIS — E43 Unspecified severe protein-calorie malnutrition: Secondary | ICD-10-CM | POA: Diagnosis not present

## 2021-11-04 ENCOUNTER — Other Ambulatory Visit: Payer: Self-pay | Admitting: Family Medicine

## 2021-11-05 ENCOUNTER — Other Ambulatory Visit: Payer: Self-pay | Admitting: Family Medicine

## 2021-11-05 DIAGNOSIS — R630 Anorexia: Secondary | ICD-10-CM

## 2021-11-06 DIAGNOSIS — D631 Anemia in chronic kidney disease: Secondary | ICD-10-CM | POA: Diagnosis not present

## 2021-11-06 DIAGNOSIS — N2581 Secondary hyperparathyroidism of renal origin: Secondary | ICD-10-CM | POA: Diagnosis not present

## 2021-11-06 DIAGNOSIS — D509 Iron deficiency anemia, unspecified: Secondary | ICD-10-CM | POA: Diagnosis not present

## 2021-11-06 DIAGNOSIS — E43 Unspecified severe protein-calorie malnutrition: Secondary | ICD-10-CM | POA: Diagnosis not present

## 2021-11-06 DIAGNOSIS — Z23 Encounter for immunization: Secondary | ICD-10-CM | POA: Diagnosis not present

## 2021-11-06 DIAGNOSIS — E8779 Other fluid overload: Secondary | ICD-10-CM | POA: Diagnosis not present

## 2021-11-06 DIAGNOSIS — N186 End stage renal disease: Secondary | ICD-10-CM | POA: Diagnosis not present

## 2021-11-07 ENCOUNTER — Encounter: Payer: Self-pay | Admitting: Family Medicine

## 2021-11-07 ENCOUNTER — Ambulatory Visit: Payer: Medicare Other | Attending: Family Medicine | Admitting: Physical Therapy

## 2021-11-07 ENCOUNTER — Ambulatory Visit (INDEPENDENT_AMBULATORY_CARE_PROVIDER_SITE_OTHER): Payer: Medicare Other | Admitting: Family Medicine

## 2021-11-07 ENCOUNTER — Other Ambulatory Visit: Payer: Medicare Other

## 2021-11-07 ENCOUNTER — Ambulatory Visit (INDEPENDENT_AMBULATORY_CARE_PROVIDER_SITE_OTHER): Payer: Medicare Other

## 2021-11-07 VITALS — BP 180/80 | HR 62 | Temp 97.7°F

## 2021-11-07 DIAGNOSIS — M6281 Muscle weakness (generalized): Secondary | ICD-10-CM | POA: Diagnosis not present

## 2021-11-07 DIAGNOSIS — Z9181 History of falling: Secondary | ICD-10-CM | POA: Insufficient documentation

## 2021-11-07 DIAGNOSIS — S80211A Abrasion, right knee, initial encounter: Secondary | ICD-10-CM

## 2021-11-07 DIAGNOSIS — I10 Essential (primary) hypertension: Secondary | ICD-10-CM

## 2021-11-07 DIAGNOSIS — D510 Vitamin B12 deficiency anemia due to intrinsic factor deficiency: Secondary | ICD-10-CM

## 2021-11-07 DIAGNOSIS — S80212A Abrasion, left knee, initial encounter: Secondary | ICD-10-CM | POA: Diagnosis not present

## 2021-11-07 DIAGNOSIS — W19XXXA Unspecified fall, initial encounter: Secondary | ICD-10-CM

## 2021-11-07 DIAGNOSIS — E538 Deficiency of other specified B group vitamins: Secondary | ICD-10-CM

## 2021-11-07 NOTE — Patient Instructions (Signed)
Abrasion An abrasion is a cut or a scrape on the surface of the skin. An abrasion does not go through all the layers of the skin. It is important to care for an abrasion properly to prevent infection. What are the causes? This condition is caused by rubbing your skin on something or falling on a surface, such as the ground. When your skin rubs on something, some layers of skin may rub off. What are the signs or symptoms? The main symptom of this condition is a cut or a scrape. The cut or scrape may be bleeding, or it may appear red or pink. If your abrasion was caused by a fall, there may be a bruise under your cut or scrape. How is this diagnosed? An abrasion is diagnosed with a physical exam. How is this treated? Treatment for this condition depends on how large and deep the abrasion is. In most cases: Your abrasion will be cleaned with water and mild soap. This is done to remove any dirt or debris (such as tiny bits of glass or rock) that may be stuck in your wound. An antibiotic ointment may be applied to your abrasion to help prevent infection. A bandage (dressing) may be placed on your abrasion to keep it clean. You may also need a tetanus shot. Follow these instructions at home: Medicines Take or apply over-the-counter and prescription medicines only as told by your health care provider. If you were prescribed an antibiotic medicine, use it as told by your health care provider. Do not stop using the antibiotic even if you start to feel better. Wound care Clean your wound 1 or 2 times a day, or as told by your health care provider. To do this: Wash your hands for at least 20 seconds with mild soap and water. Do this before and after you clean your wound. Wash your wound with mild soap and water and then rinse off the soap. Pat your wound dry with a clean towel. Do not rub your wound. Keep your dressing clean and dry as told by your health care provider. There are many different ways to  close and cover a wound. Follow instructions from your health care provider about caring for your wound and about changing and removing your dressing. You may have to change your dressing one or more times a day, or as directed by your health care provider. Check your wound every day for signs of infection. Check for: Redness, especially a red streak that spreads out from your wound. Swelling or increased pain. Warmth. Blood, fluid, pus, or a bad smell. Managing pain and swelling  If directed, put ice on the injured area. To do this: Put ice in a plastic bag. Place a towel between your skin and the bag. Leave the ice on for 20 minutes, 2-3 times a day. Remove the ice if your skin turns bright red. This is very important. If you cannot feel pain, heat, or cold, you have a greater risk of damage to the area. If possible, raise (elevate) the injured area above the level of your heart while you are sitting or lying down. General instructions Do not take baths, swim, or use a hot tub until your health care provider approves. Ask your doctor about taking showers or sponge baths. Keep all follow-up visits. This is important. Contact a health care provider if: You received a tetanus shot, and you have swelling, severe pain, redness, or bleeding at your injection site. Your pain is not controlled   with medicine. You have a fever. You have any of these signs of infection: Redness, swelling, or more pain around your wound. Warmth coming from your wound. Blood, fluid, pus, or a bad smell coming from your wound. Get help right away if: You have a red streak spreading away from your wound. Summary An abrasion is a cut or a scrape on the surface of the skin. Care for your abrasion properly to prevent infection. Clean your wound with mild soap and water 1 or 2 times a day or as often as told. Follow instructions from your health care provider about taking medicines and changing your bandage  (dressing). Contact your health care provider if you have a fever or if you have redness, swelling, or more pain around your wound. Contact your health care provider if you have warmth, blood, fluid, pus, or a bad smell coming from your wound. Get help right away if there is a red streak spreading away from your wound. This information is not intended to replace advice given to you by your health care provider. Make sure you discuss any questions you have with your health care provider. Document Revised: 04/22/2019 Document Reviewed: 04/22/2019 Elsevier Patient Education  2023 Elsevier Inc.  

## 2021-11-07 NOTE — Progress Notes (Signed)
B12 given to patient and tolerated well. 

## 2021-11-07 NOTE — Therapy (Signed)
OUTPATIENT PHYSICAL THERAPY LOWER EXTREMITY EVALUATION   Patient Name: Patrick Brown MRN: [id number] DOB:09-25-1970, 51 y.o., male Today's Date: 11/07/2021   PT End of Session - 11/07/21 1423     Visit Number 9    Number of Visits 12    Date for PT Re-Evaluation 11/30/21    PT Start Time 0145    PT Stop Time 0221    PT Time Calculation (min) 36 min    Activity Tolerance Patient tolerated treatment well    Behavior During Therapy WFL for tasks assessed/performed              Past Medical History:  Diagnosis Date   Anemia, iron deficiency On procrit   CAP (community acquired pneumonia)    CKD (chronic kidney disease) stage 3, GFR 30-59 ml/min (HCC)    Degenerative arthritis    Depression    Dyslipidemia    Gastroesophageal reflux disease    Gastroparesis diabeticorum ([place])    Hematuria, microscopic 10/09   work up negative (Dr. Amalia Hailey)   HIV positive ([place])    Hyperkalemia, diminished renal excretion 06/2011 secondary to TMP/SMZ; prior secondary to  ARBS;    Known potassium excretory defect; history of recurrent hyperkalemia due to diabetic renal disease; ACE/ARB contraindicated; hyperkalemia 06/2011 secondary to TMP-SMZ   Hypothyroidism    IDDM (insulin dependent diabetes mellitus)    38 years   Low HDL (under 40)    Proteinuria    Retinopathy    x2   SIRS (systemic inflammatory response syndrome) ([place])    Past Surgical History:  Procedure Laterality Date   CATARACT EXTRACTION Right    COLONOSCOPY     ESOPHAGOGASTRODUODENOSCOPY     EYE SURGERY  2006,2001   x2    HIP ARTHROPLASTY Right 05/10/2016   Procedure: RIGHT HIP HEMIARTHROPLASTY;  Surgeon: Marchia Bond, MD;  Location: [facility];  Service: Orthopedics;  Laterality: Right;   insulin pump     LASIK Bilateral    VIDEO BRONCHOSCOPY Bilateral 12/15/2012   Procedure: VIDEO BRONCHOSCOPY WITH FLUORO;  Surgeon: Kathee Delton, MD;  [facility];  Service: Cardiopulmonary;  Laterality: Bilateral;   VITRECTOMY   bilateral   Patient Active Problem List   Diagnosis Date Noted   PCO (posterior capsular opacification), right 07/30/2021   Mild episode of recurrent major depressive disorder ([place]) 04/04/2021   Chronic idiopathic constipation 03/23/2021   Asthmatic bronchitis 03/05/2021   Stable treated proliferative diabetic retinopathy of right eye determined by examination associated with type 1 diabetes mellitus ([place]) 11/01/2019   Stable treated proliferative diabetic retinopathy of left eye without macular edema determined by examination associated with type 1 diabetes mellitus ([place]) 11/01/2019   Exocrine pancreatic insufficiency 09/29/2019   ESRD (end stage renal disease) on dialysis ([place]) 09/09/2019   Elevated SGOT 07/14/2019   Homocystinemia 07/14/2019   Anxiety 03/17/2019   Non-seasonal allergic rhinitis due to pollen 11/06/2016   Benign prostatic hyperplasia without lower urinary tract symptoms 04/18/2016   Vitamin D deficiency 04/18/2016   OSA (obstructive sleep apnea) 10/06/2014   Pseudophakia of both eyes 06/23/2013   CAP (community acquired pneumonia) 08/05/2012   HIV disease ([place]) 06/02/2012   Convulsions/seizures ([place]) 05/16/2012   Gastroparesis 04/18/2012   Anemia, iron deficiency    Type 1 diabetes mellitus ([place]) 05/08/2011   Acquired hypothyroidism 05/08/2011   REFERRING PROVIDER: Dettinger, Fransisca Kaufmann, MD  REFERRING DIAG: Acute pain of right knee; Recurrent falls  THERAPY DIAG:  Muscle weakness (generalized)  History of falling  Rationale  for Evaluation and Treatment Rehabilitation  ONSET DATE: 3-4 weeks ago  SUBJECTIVE:   SUBJECTIVE STATEMENT: Feeling weak today.  Getting a B12 shot. PERTINENT HISTORY: DM, history of seizures, depression, HTN  PAIN:  PATIENT GOALS improved strength and safety, navigate at least 1 step to get into his house   OBJECTIVE:   TODAY'S TREATMENT:                                   11/07/21 EXERCISE LOG  Exercise Repetitions and  Resistance Comments  Nustep Lvl 4 x 15 mins   Rockerboard in parallelbars 3 minutes   Airex balance pad 3 minutes   Horizontal Abduction    Forward Step Ups    LAQ 5# to fatigue x 2   Seated Marches 5# to fatigue x 2   Ball Squeezes    Hip abduction Green theraband x 2 minutes.                Blank cell = exercise not performed today     ASSESSMENT:  CLINICAL IMPRESSION: Patient tired to day and wanted to go a bit easier as he is going to get a B12 shot.  Nonetheless, he was motivated and did well.  He liked the addition of the Airex balance pad today.  OBJECTIVE IMPAIRMENTS Abnormal gait, decreased activity tolerance, decreased balance, decreased mobility, difficulty walking, decreased ROM, and decreased strength.   ACTIVITY LIMITATIONS squatting, stairs, transfers, and locomotion level  PARTICIPATION LIMITATIONS: driving, shopping, community activity, and yard work  PERSONAL FACTORS Fitness, Transportation, and 3+ comorbidities: DM, history of seizures, depression, HTN  are also affecting patient's functional outcome.   REHAB POTENTIAL: Fair    CLINICAL DECISION MAKING: Unstable/unpredictable  EVALUATION COMPLEXITY: High   GOALS: Goals reviewed with patient? Yes  SHORT TERM GOALS: Target date: 10/17/2021  Patient will be independent with his initial HEP.  Baseline: Goal status: MET   2.  Patient will be able to improve his TUG time to 20 seconds or less.  Baseline: 19.8 secs with UE support Goal status: MET  3.  Patient will be able to improve his five time sit to stand time to 20 seconds or less with upper extremity support.  Baseline: 18.5 secs no cane Goal status: MET  4.  Patient will be able to ambulate with proper utilization of his cane.  Baseline:  Goal status: MET  LONG TERM GOALS: Target date: 11/07/2021   Patient will be independent with his advanced HEP.  Baseline:  Goal status: IN PROGRESS  2.  Patient will be able to independently navigate  at least 1 step with 1 railing for improved ease of household navigation.  Baseline:  Goal status: IN PROGRESS  3.  Patient will be able to improve his TUG time to 15 seconds or less.  Baseline:  Goal status: IN PROGRESS  4.   Patient will be able to improve his five time sit to stand time to 15 seconds or less with upper extremity support.  Baseline:  Goal status: IN PROGRESS  PLAN: PT FREQUENCY: 2x/week  PT DURATION: 6 weeks  PLANNED INTERVENTIONS: Therapeutic exercises, Therapeutic activity, Neuromuscular re-education, Balance training, Gait training, Patient/Family education, Self Care, Joint mobilization, Stair training, and Re-evaluation  PLAN FOR NEXT SESSION: nustep, lower extremity strengthening, review HEP, and balance interventions   Norrin Shreffler, [place], PT 11/07/2021, 2:24 PM

## 2021-11-07 NOTE — Progress Notes (Signed)
   Acute Office Visit  Subjective:     Patient ID: Patrick Brown, male    DOB: 1970/11/27, 51 y.o.   MRN: [id number]  Chief Complaint  Patient presents with   Fall    Parking lot    Fall   Here with mother today. Patient is in today for a fall. He was leaving the office after a triage appt for a b12 injection. He slipped when stepping off of the curb and landed on his left knee. He has an abrasion to the lower knee because of this. Denies pain. Denies chest pain, shortness of breath, visual disturbances, or dizziness prior to the fall.  ROS As per HPI.      Objective:    BP (!) 180/80   Pulse 62   Temp 97.7 F (36.5 C) (Temporal)   SpO2 96%  BP Readings from Last 3 Encounters:  11/07/21 (!) 180/80  10/22/21 (!) 166/67  10/12/21 (!) 180/80      Physical Exam Vitals and nursing note reviewed.  Constitutional:      General: He is not in acute distress.    Appearance: He is not ill-appearing, toxic-appearing or diaphoretic.  Cardiovascular:     Rate and Rhythm: Normal rate and regular rhythm.     Heart sounds: Normal heart sounds. No murmur heard. Pulmonary:     Effort: Pulmonary effort is normal. No respiratory distress.  Musculoskeletal:     Right lower leg: No edema.     Left lower leg: No edema.  Skin:    General: Skin is warm and dry.     Findings: Abrasion (abrasion to left lower knee about 2 cm x 2cm. Bleeding has stopped. Abrasion is shallow.) present.  Neurological:     General: No focal deficit present.     Mental Status: He is alert and oriented to person, place, and time.  Psychiatric:        Mood and Affect: Mood normal.        Behavior: Behavior normal.     No results found for any visits on 11/07/21.      Assessment & Plan:  Domnic was seen today for fall.  Diagnoses and all orders for this visit:  Fall, initial encounter Abrasion, right knee, initial encounter Shallow abrasion. Cleansed and dressed in visit. Discussed home wound care.    Primary hypertension Not at goal today. Monitor BP at home and notify for persistently elevated readings.    Return if symptoms worsen or fail to improve.  The patient indicates understanding of these issues and agrees with the plan.   Gwenlyn Perking, FNP

## 2021-11-08 DIAGNOSIS — E43 Unspecified severe protein-calorie malnutrition: Secondary | ICD-10-CM | POA: Diagnosis not present

## 2021-11-08 DIAGNOSIS — N186 End stage renal disease: Secondary | ICD-10-CM | POA: Diagnosis not present

## 2021-11-10 DIAGNOSIS — N2581 Secondary hyperparathyroidism of renal origin: Secondary | ICD-10-CM | POA: Diagnosis not present

## 2021-11-10 DIAGNOSIS — N186 End stage renal disease: Secondary | ICD-10-CM | POA: Diagnosis not present

## 2021-11-10 DIAGNOSIS — E8779 Other fluid overload: Secondary | ICD-10-CM | POA: Diagnosis not present

## 2021-11-10 DIAGNOSIS — Z23 Encounter for immunization: Secondary | ICD-10-CM | POA: Diagnosis not present

## 2021-11-10 DIAGNOSIS — E119 Type 2 diabetes mellitus without complications: Secondary | ICD-10-CM | POA: Diagnosis not present

## 2021-11-10 DIAGNOSIS — E43 Unspecified severe protein-calorie malnutrition: Secondary | ICD-10-CM | POA: Diagnosis not present

## 2021-11-10 DIAGNOSIS — D509 Iron deficiency anemia, unspecified: Secondary | ICD-10-CM | POA: Diagnosis not present

## 2021-11-10 DIAGNOSIS — D631 Anemia in chronic kidney disease: Secondary | ICD-10-CM | POA: Diagnosis not present

## 2021-11-12 ENCOUNTER — Ambulatory Visit: Payer: Medicare Other | Admitting: Physical Therapy

## 2021-11-12 ENCOUNTER — Encounter: Payer: Self-pay | Admitting: Physical Therapy

## 2021-11-12 DIAGNOSIS — M6281 Muscle weakness (generalized): Secondary | ICD-10-CM | POA: Diagnosis not present

## 2021-11-12 DIAGNOSIS — Z9181 History of falling: Secondary | ICD-10-CM | POA: Diagnosis not present

## 2021-11-12 NOTE — Therapy (Signed)
OUTPATIENT PHYSICAL THERAPY LOWER EXTREMITY TREATMENT   Patient Name: Patrick Brown MRN: [id number] DOB:06/28/1970, 51 y.o., male Today's Date: 11/12/2021   PT End of Session - 11/12/21 1301     Visit Number 10    Number of Visits 12    Date for PT Re-Evaluation 11/30/21    PT Start Time 1301    PT Stop Time 1340    PT Time Calculation (min) 39 min    Activity Tolerance Patient tolerated treatment well    Behavior During Therapy WFL for tasks assessed/performed              Past Medical History:  Diagnosis Date   Anemia, iron deficiency On procrit   CAP (community acquired pneumonia)    CKD (chronic kidney disease) stage 3, GFR 30-59 ml/min (HCC)    Degenerative arthritis    Depression    Dyslipidemia    Gastroesophageal reflux disease    Gastroparesis diabeticorum (HCC)    Hematuria, microscopic 10/09   work up negative (Dr. Evans)   HIV positive (HCC)    Hyperkalemia, diminished renal excretion 06/2011 secondary to TMP/SMZ; prior secondary to  ARBS;    Known potassium excretory defect; history of recurrent hyperkalemia due to diabetic renal disease; ACE/ARB contraindicated; hyperkalemia 06/2011 secondary to TMP-SMZ   Hypothyroidism    IDDM (insulin dependent diabetes mellitus)    38 years   Low HDL (under 40)    Proteinuria    Retinopathy    x2   SIRS (systemic inflammatory response syndrome) (HCC)    Past Surgical History:  Procedure Laterality Date   CATARACT EXTRACTION Right    COLONOSCOPY     ESOPHAGOGASTRODUODENOSCOPY     EYE SURGERY  2006,2001   x2    HIP ARTHROPLASTY Right 05/10/2016   Procedure: RIGHT HIP HEMIARTHROPLASTY;  Surgeon: Joshua Landau, MD;  Location: [facility];  Service: Orthopedics;  Laterality: Right;   insulin pump     LASIK Bilateral    VIDEO BRONCHOSCOPY Bilateral 12/15/2012   Procedure: VIDEO BRONCHOSCOPY WITH FLUORO;  Surgeon: Keith M Clance, MD;  Location: [facility];  Service: Cardiopulmonary;  Laterality: Bilateral;   VITRECTOMY   bilateral   Patient Active Problem List   Diagnosis Date Noted   PCO (posterior capsular opacification), right 07/30/2021   Mild episode of recurrent major depressive disorder (HCC) 04/04/2021   Chronic idiopathic constipation 03/23/2021   Asthmatic bronchitis 03/05/2021   Stable treated proliferative diabetic retinopathy of right eye determined by examination associated with type 1 diabetes mellitus (HCC) 11/01/2019   Stable treated proliferative diabetic retinopathy of left eye without macular edema determined by examination associated with type 1 diabetes mellitus (HCC) 11/01/2019   Exocrine pancreatic insufficiency 09/29/2019   ESRD (end stage renal disease) on dialysis (HCC) 09/09/2019   Elevated SGOT 07/14/2019   Homocystinemia 07/14/2019   Anxiety 03/17/2019   Non-seasonal allergic rhinitis due to pollen 11/06/2016   Benign prostatic hyperplasia without lower urinary tract symptoms 04/18/2016   Vitamin D deficiency 04/18/2016   OSA (obstructive sleep apnea) 10/06/2014   Pseudophakia of both eyes 06/23/2013   CAP (community acquired pneumonia) 08/05/2012   HIV disease (HCC) 06/02/2012   Convulsions/seizures (HCC) 05/16/2012   Gastroparesis 04/18/2012   Anemia, iron deficiency    Type 1 diabetes mellitus (HCC) 05/08/2011   Acquired hypothyroidism 05/08/2011   REFERRING PROVIDER: Dettinger, Joshua A, MD  REFERRING DIAG: Acute pain of right knee; Recurrent falls  THERAPY DIAG:  Muscle weakness (generalized)  History of falling  Rationale   for Evaluation and Treatment Rehabilitation  ONSET DATE: 3-4 weeks ago  SUBJECTIVE:   SUBJECTIVE STATEMENT: [place] Wednesday when leaving the MD office after his shot.  PERTINENT HISTORY: DM, history of seizures, depression, HTN  PAIN: No numerical rating of pain but did report discomfort of L knee due to fall last week.  PATIENT GOALS improved strength and safety, navigate at least 1 step to get into his house  OBJECTIVE:    TODAY'S TREATMENT:                                   11/12/21 EXERCISE LOG  Exercise Repetitions and Resistance Comments  Nustep Lvl 4 x 15 mins   LAQ 5# x20 reps   Seated Marches 5# x20 reps   [employer] X20 reps   Hip clams Red theraband x20 reps   Heel/toe raises X20 reps            Blank cell = exercise not performed today   ASSESSMENT:  CLINICAL IMPRESSION: Patient presented in clinic with reports of a fall following last treatment. Patient reports abrasions to L knee from the fall. Patient still not feeling well following treatment due to L knee thus conservative treatment completed with light therex. Patient limited with therex due to fatigue as reported following treatment.  OBJECTIVE IMPAIRMENTS Abnormal gait, decreased activity tolerance, decreased balance, decreased mobility, difficulty walking, decreased ROM, and decreased strength.   ACTIVITY LIMITATIONS squatting, stairs, transfers, and locomotion level  PARTICIPATION LIMITATIONS: driving, shopping, community activity, and yard work  PERSONAL FACTORS Fitness, Transportation, and 3+ comorbidities: DM, history of seizures, depression, HTN  are also affecting patient's functional outcome.   REHAB POTENTIAL: Fair    CLINICAL DECISION MAKING: Unstable/unpredictable  EVALUATION COMPLEXITY: High   GOALS: Goals reviewed with patient? Yes  SHORT TERM GOALS: Target date: 10/17/2021  Patient will be independent with his initial HEP.  Baseline: Goal status: MET   2.  Patient will be able to improve his TUG time to 20 seconds or less.  Baseline: 19.8 secs with UE support Goal status: MET  3.  Patient will be able to improve his five time sit to stand time to 20 seconds or less with upper extremity support.  Baseline: 18.5 secs no cane Goal status: MET  4.  Patient will be able to ambulate with proper utilization of his cane.  Baseline:  Goal status: MET  LONG TERM GOALS: Target date: 11/07/2021   Patient  will be independent with his advanced HEP.  Baseline:  Goal status: IN PROGRESS  2.  Patient will be able to independently navigate at least 1 step with 1 railing for improved ease of household navigation.  Baseline:  Goal status: IN PROGRESS  3.  Patient will be able to improve his TUG time to 15 seconds or less.  Baseline:  Goal status: IN PROGRESS  4.   Patient will be able to improve his five time sit to stand time to 15 seconds or less with upper extremity support.  Baseline:  Goal status: IN PROGRESS  PLAN: PT FREQUENCY: 2x/week  PT DURATION: 6 weeks  PLANNED INTERVENTIONS: Therapeutic exercises, Therapeutic activity, Neuromuscular re-education, Balance training, Gait training, Patient/Family education, Self Care, Joint mobilization, Stair training, and Re-evaluation  PLAN FOR NEXT SESSION: nustep, lower extremity strengthening, review HEP, and balance interventions   Standley Brooking, PTA 11/12/2021, 1:53 PM

## 2021-11-13 DIAGNOSIS — Z23 Encounter for immunization: Secondary | ICD-10-CM | POA: Diagnosis not present

## 2021-11-13 DIAGNOSIS — D509 Iron deficiency anemia, unspecified: Secondary | ICD-10-CM | POA: Diagnosis not present

## 2021-11-13 DIAGNOSIS — N2581 Secondary hyperparathyroidism of renal origin: Secondary | ICD-10-CM | POA: Diagnosis not present

## 2021-11-13 DIAGNOSIS — N186 End stage renal disease: Secondary | ICD-10-CM | POA: Diagnosis not present

## 2021-11-13 DIAGNOSIS — E43 Unspecified severe protein-calorie malnutrition: Secondary | ICD-10-CM | POA: Diagnosis not present

## 2021-11-13 DIAGNOSIS — E8779 Other fluid overload: Secondary | ICD-10-CM | POA: Diagnosis not present

## 2021-11-13 DIAGNOSIS — D631 Anemia in chronic kidney disease: Secondary | ICD-10-CM | POA: Diagnosis not present

## 2021-11-14 ENCOUNTER — Ambulatory Visit (INDEPENDENT_AMBULATORY_CARE_PROVIDER_SITE_OTHER): Payer: Medicare Other | Admitting: Surgery

## 2021-11-14 ENCOUNTER — Encounter: Payer: Self-pay | Admitting: Surgery

## 2021-11-14 VITALS — BP 175/76 | HR 62 | Temp 97.5°F | Resp 14 | Ht 70.0 in | Wt 195.0 lb

## 2021-11-14 DIAGNOSIS — L723 Sebaceous cyst: Secondary | ICD-10-CM

## 2021-11-14 NOTE — Progress Notes (Signed)
[facility] History and Physical  Reason for Referral: Right axillary cyst Referring Physician: Dr. Warrick Parisian  Chief Complaint   New Patient (Initial Visit)     Patrick Brown is a 51 y.o. male.  HPI: Patient presents for evaluation of a right axillary boil.  About 2 months ago, he noted he had a right boil that was starting to become red and tender.  He followed up with his primary care doctor, who placed him on 10 days of doxycycline and recommended warm compresses.  After applying warm compresses, the area opened and started draining pus and then a thick white drainage.  Since the area started draining, he denies any pain associated with the area.  He is no longer applying warm compresses.  He denies any fevers or chills.  He does have a history of a cyst in his right groin which improved with conservative management of antibiotics and warm compresses.  His past medical history is significant for diabetes on insulin, he is HIV positive on antiretroviral medications, hypothyroidism, hypertension, end-stage renal disease on HD Tuesday, Thursday, and Saturday via left upper extremity fistula, and coronary artery disease status post stent placement in April.  He is currently on Plavix and 81 mg aspirin.  He has met with transplant surgeons regarding kidney transplant who waiting to place the patient on the list until 6 months after his stents were placed, so that his Plavix may be held.  His past surgical history significant for left AV fistula creation, right hip surgery.  He denies any history of abdominal surgeries or cyst excisions.  He denies use of tobacco products, alcohol, and illicit drugs.  Past Medical History:  Diagnosis Date   Anemia, iron deficiency On procrit   CAP (community acquired pneumonia)    CKD (chronic kidney disease) stage 3, GFR 30-59 ml/min (HCC)    Degenerative arthritis    Depression    Dyslipidemia    Gastroesophageal reflux disease    Gastroparesis  diabeticorum ([place])    Hematuria, microscopic 10/09   work up negative (Dr. Amalia Hailey)   HIV positive [facility])    Hyperkalemia, diminished renal excretion 06/2011 secondary to TMP/SMZ; prior secondary to  ARBS;    Known potassium excretory defect; history of recurrent hyperkalemia due to diabetic renal disease; ACE/ARB contraindicated; hyperkalemia 06/2011 secondary to TMP-SMZ   Hypothyroidism    IDDM (insulin dependent diabetes mellitus)    38 years   Low HDL (under 40)    Proteinuria    Retinopathy    x2   SIRS (systemic inflammatory response syndrome) ([place])     Past Surgical History:  Procedure Laterality Date   CATARACT EXTRACTION Right    COLONOSCOPY     ESOPHAGOGASTRODUODENOSCOPY     EYE SURGERY  2006,2001   x2    HIP ARTHROPLASTY Right 05/10/2016   Procedure: RIGHT HIP HEMIARTHROPLASTY;  Surgeon: Marchia Bond, MD;  Location: [facility];  Service: Orthopedics;  Laterality: Right;   insulin pump     LASIK Bilateral    VIDEO BRONCHOSCOPY Bilateral 12/15/2012   Procedure: VIDEO BRONCHOSCOPY WITH FLUORO;  Surgeon: Kathee Delton, MD;  Location: [facility];  Service: Cardiopulmonary;  Laterality: Bilateral;   VITRECTOMY  bilateral    Family History  Problem Relation Age of Onset   Diabetes type I Brother    Prostate cancer Other    Dementia Other    Dementia Other    Dementia Other     Social History   Tobacco Use  Smoking status: Never   Smokeless tobacco: Never  Vaping Use   Vaping Use: Never used  Substance Use Topics   Alcohol use: No    Alcohol/week: 0.0 standard drinks of alcohol    Comment: Infrequent, less than 1 x per month.   Drug use: No    Medications: I have reviewed the patient's current medications. Allergies as of 11/14/2021       Reactions   Sulfa Antibiotics Other (See Comments)   High potassium   Ramipril Cough   Versed [midazolam] Other (See Comments)   "I don't wake up very good or clear it out of my system"        Medication List         Accurate as of November 14, 2021  1:28 PM. If you have any questions, ask your nurse or doctor.          acetaminophen 500 MG tablet Commonly known as: TYLENOL Take 1,000 mg by mouth 2 (two) times daily.   acyclovir 400 MG tablet Commonly known as: ZOVIRAX Takes mondays, wednesdays and fridays   AgaMatrix Ultra-Thin Lancets Misc TEST BS 4 TIMES A DAY AND AS NEEDED DX E10.65   albuterol (2.5 MG/3ML) 0.083% nebulizer solution Commonly known as: PROVENTIL Take 3 mLs (2.5 mg total) by nebulization every 6 (six) hours as needed for wheezing or shortness of breath.   albuterol 108 (90 Base) MCG/ACT inhaler Commonly known as: VENTOLIN HFA Inhale 2 puffs into the lungs every 6 (six) hours as needed for wheezing or shortness of breath.   amLODipine 10 MG tablet Commonly known as: NORVASC Take 5 mg by mouth daily.   aspirin 81 MG chewable tablet Chew 81 mg by mouth every morning.   benzonatate 200 MG capsule Commonly known as: TESSALON Take 1 capsule (200 mg total) by mouth 3 (three) times daily as needed for cough.   clopidogrel 75 MG tablet Commonly known as: PLAVIX Take 40 mg by mouth daily. Take daily   cyclobenzaprine 10 MG tablet Commonly known as: FLEXERIL Take 1 tablet (10 mg total) by mouth 3 (three) times daily as needed for muscle spasms.   diclofenac Sodium 1 % Gel Commonly known as: VOLTAREN Apply 2 g topically 4 (four) times daily.   diphenoxylate-atropine 2.5-0.025 MG tablet Commonly known as: LOMOTIL TAKE 1 TABLET BY MOUTH 4 (FOUR) TIMES DAILY AS NEEDED FOR DIARRHEA OR LOOSE STOOLS.   Dovato 50-300 MG tablet Generic drug: dolutegravir-lamiVUDine TAKE 1 TABLET BY MOUTH DAILY   doxazosin 1 MG tablet Commonly known as: CARDURA Take 1 mg by mouth daily.   dronabinol 2.5 MG capsule Commonly known as: MARINOL Take 1 capsule (2.5 mg total) by mouth 2 (two) times daily before lunch and supper.   DULoxetine 60 MG capsule Commonly known as:  CYMBALTA Take 1 capsule (60 mg total) by mouth daily. Take with 30 mg fot a total of 40m   DULoxetine 30 MG capsule Commonly known as: CYMBALTA Take 1 capsule (30 mg total) by mouth daily. TAKE WITH THE 60MG FOR TOTAL OF 90MG   febuxostat 40 MG tablet Commonly known as: ULORIC Take 1 tablet (40 mg total) by mouth daily.   ferrous sulfate 325 (65 FE) MG tablet Take 650 mg by mouth daily with breakfast.   fluticasone 50 MCG/ACT nasal spray Commonly known as: FLONASE SPRAY 2 SPRAYS INTO EACH NOSTRIL EVERY DAY   furosemide 40 MG tablet Commonly known as: LASIX Take 40 mg by mouth daily. 40 mg nightly  GLUCOSAMINE 1500 COMPLEX PO Take 1 tablet by mouth 2 (two) times daily.   glucosamine-chondroitin 500-400 MG tablet Take by mouth.   glucose blood test strip Commonly known as: OneTouch Verio TEST BLOOD SUGAR 4 TIMES DAILY AND AS NEEDED   hydrALAZINE 100 MG tablet Commonly known as: APRESOLINE Take by mouth.   HYDROcodone bit-homatropine 5-1.5 MG/5ML syrup Commonly known as: HYCODAN Take 5 mLs by mouth every 6 (six) hours as needed for cough.   hyoscyamine 0.125 MG tablet Commonly known as: LEVSIN TAKE 1 TABLET (0.125 MG TOTAL) BY MOUTH EVERY 4 (FOUR) HOURS AS NEEDED.   icosapent Ethyl 1 g capsule Commonly known as: VASCEPA TAKE 2 CAPSULES BY MOUTH TWICE A DAY   insulin glargine 100 UNIT/ML injection Commonly known as: LANTUS In the event of insulin pump failure, inject 8 units twice daily   insulin lispro 100 UNIT/ML injection Commonly known as: HumaLOG USE 42 UNITS TO 120 UNITS PER PUMP DAILY AS DIRECTED   ipratropium 0.03 % nasal spray Commonly known as: ATROVENT USE 2 SPRAYS IN EACH NOSTRIL 2-3 TIMES DAILY   levocetirizine 5 MG tablet Commonly known as: XYZAL TAKE 1 TABLET BY MOUTH EVERY DAY IN THE EVENING   levothyroxine 300 MCG tablet Commonly known as: SYNTHROID Take 1 tablet (300 mcg total) by mouth daily.   lipase/protease/amylase 12000-38000  units Cpep capsule Commonly known as: CREON Take 2 capsules prior to meals and 1 capsule prior to snacks   LORazepam 0.5 MG tablet Commonly known as: ATIVAN Take by mouth.   losartan 100 MG tablet Commonly known as: COZAAR Take 100 mg by mouth daily.   meclizine 12.5 MG tablet Commonly known as: ANTIVERT Take 1 tablet (12.5 mg total) by mouth 3 (three) times daily as needed for dizziness.   metoCLOPramide 5 MG tablet Commonly known as: REGLAN Take 5 mg by mouth 4 (four) times daily.   metoprolol succinate 50 MG 24 hr tablet Commonly known as: TOPROL-XL Take 50 mg by mouth 2 (two) times daily.   mirtazapine 30 MG tablet Commonly known as: REMERON TAKE 1 TABLET BY MOUTH AT BEDTIME.   Misc Intestinal Flora Regulat Caps Take 1 capsule by mouth every morning.   montelukast 10 MG tablet Commonly known as: SINGULAIR TAKE 1 TABLET BY MOUTH EVERYDAY AT BEDTIME   niacin 1000 MG CR tablet Commonly known as: NIASPAN TAKE 1 TABLET (1,000 MG TOTAL) BY MOUTH AT BEDTIME.   ondansetron 4 MG tablet Commonly known as: ZOFRAN Take 1 tablet (4 mg total) by mouth every 8 (eight) hours as needed. for nausea   pantoprazole 40 MG tablet Commonly known as: PROTONIX Take 40 mg by mouth 2 (two) times daily.   promethazine 25 MG suppository Commonly known as: PHENERGAN PLACE 1 SUPPOSITORY (25 MG TOTAL) RECTALLY EVERY 6 (SIX) HOURS AS NEEDED FOR NAUSEA OR VOMITING.   rosuvastatin 20 MG tablet Commonly known as: CRESTOR Take 1 tablet (20 mg total) by mouth at bedtime.   sevelamer carbonate 800 MG tablet Commonly known as: RENVELA Take 800 mg by mouth 3 (three) times daily. 2 tabs before each meal   sucralfate 1 g tablet Commonly known as: CARAFATE Take 1 tablet (1 g total) by mouth 2 (two) times daily.   tamsulosin 0.4 MG Caps capsule Commonly known as: FLOMAX TAKE 1 CAPSULE BY MOUTH EVERYDAY AT BEDTIME   Testosterone 20.25 MG/ACT (1.62%) Gel APPLY 3 PUMPS DAILY AS DIRECTED    traMADol 50 MG tablet Commonly known as: ULTRAM Take 1  tablet (50 mg total) by mouth daily as needed.   traZODone 50 MG tablet Commonly known as: DESYREL Take by mouth.         ROS:  Constitutional: negative for chills, fatigue, and fevers Eyes: negative for visual disturbance and pain Ears, nose, mouth, throat, and face: positive for sinus problems, negative for ear drainage and sore throat Respiratory: positive for cough and wheezing, negative for shortness of breath Cardiovascular: negative for chest pain and palpitations Gastrointestinal: positive for abdominal pain and nausea, negative for reflux symptoms and vomiting Genitourinary:negative for dysuria, frequency, and urinary retention Integument/breast: negative for dryness and rash Hematologic/lymphatic: negative for bleeding and lymphadenopathy Musculoskeletal:negative for back pain, neck pain, and joint pain Neurological: negative for dizziness, tremors, and numbness Endocrine: negative for temperature intolerance  Blood pressure (!) 175/76, pulse 62, temperature (!) 97.5 F (36.4 C), temperature source Oral, resp. rate 14, height _0  (1.778 m), weight 195 lb (88.5 kg), SpO2 93 %. Physical Exam Vitals reviewed.  Constitutional:      Appearance: Normal appearance.  HENT:     Head: Normocephalic and atraumatic.  Eyes:     Extraocular Movements: Extraocular movements intact.     Pupils: Pupils are equal, round, and reactive to light.  Cardiovascular:     Rate and Rhythm: Normal rate and regular rhythm.  Pulmonary:     Effort: Pulmonary effort is normal.     Breath sounds: Normal breath sounds.  Abdominal:     General: There is no distension.     Palpations: Abdomen is soft.     Tenderness: There is no abdominal tenderness.  Musculoskeletal:        General: Normal range of motion.     Cervical back: Normal range of motion.     Comments: Left upper extremity AV fistula with palpable thrill  Skin:     General: Skin is warm and dry.     Comments: Right axilla with less than 1 cm cyst, no erythema, tenderness, or concern for infection; no current drainage  Neurological:     General: No focal deficit present.     Mental Status: He is alert and oriented to person, place, and time.  Psychiatric:        Mood and Affect: Mood normal.        Behavior: Behavior normal.     Results: No results found. However, due to the size of the patient record, not all encounters were searched. Please check Results Review for a complete set of results.  No results found.   Assessment & Plan:  Patrick Brown is a 51 y.o. male who presents for evaluation of right axillary cyst.  -I explained the pathophysiology of sebaceous cysts.  I also explained that we will usually excise them if they are causing pain or develop frequent infections -Given that the patient is currently on Plavix for recent stent placement less than 6 months ago and improvement in his symptoms after conservative management with antibiotics and warm compresses, I recommend against surgical excision of the cyst at this time -Information provided to the patient regarding sebaceous cysts -Advised him to follow-up with his PCP or myself if he begins to have tenderness, erythema, fever, chills, or purulent drainage -Follow up with me as needed  All questions were answered to the satisfaction of the patient and family.  Graciella Freer, DO [facility] [address] [place], [place] [address] [phone] (office)

## 2021-11-15 DIAGNOSIS — N186 End stage renal disease: Secondary | ICD-10-CM | POA: Diagnosis not present

## 2021-11-15 DIAGNOSIS — Z23 Encounter for immunization: Secondary | ICD-10-CM | POA: Diagnosis not present

## 2021-11-15 DIAGNOSIS — D631 Anemia in chronic kidney disease: Secondary | ICD-10-CM | POA: Diagnosis not present

## 2021-11-15 DIAGNOSIS — D509 Iron deficiency anemia, unspecified: Secondary | ICD-10-CM | POA: Diagnosis not present

## 2021-11-15 DIAGNOSIS — E8779 Other fluid overload: Secondary | ICD-10-CM | POA: Diagnosis not present

## 2021-11-15 DIAGNOSIS — N2581 Secondary hyperparathyroidism of renal origin: Secondary | ICD-10-CM | POA: Diagnosis not present

## 2021-11-15 DIAGNOSIS — E43 Unspecified severe protein-calorie malnutrition: Secondary | ICD-10-CM | POA: Diagnosis not present

## 2021-11-16 ENCOUNTER — Telehealth: Payer: Self-pay | Admitting: Family Medicine

## 2021-11-16 ENCOUNTER — Ambulatory Visit: Payer: Medicare Other

## 2021-11-16 DIAGNOSIS — E1022 Type 1 diabetes mellitus with diabetic chronic kidney disease: Secondary | ICD-10-CM | POA: Diagnosis not present

## 2021-11-16 DIAGNOSIS — Z9181 History of falling: Secondary | ICD-10-CM

## 2021-11-16 DIAGNOSIS — E109 Type 1 diabetes mellitus without complications: Secondary | ICD-10-CM | POA: Diagnosis not present

## 2021-11-16 DIAGNOSIS — M6281 Muscle weakness (generalized): Secondary | ICD-10-CM | POA: Diagnosis not present

## 2021-11-16 NOTE — Telephone Encounter (Signed)
To right PT for a new area or in the event we would have to see him back because we would have to document that so that insurance would cover it.

## 2021-11-16 NOTE — Telephone Encounter (Signed)
Left message informing pt of this and to call back for an appt. Informed that it could be virtual if needed.

## 2021-11-16 NOTE — Therapy (Signed)
OUTPATIENT PHYSICAL THERAPY LOWER EXTREMITY TREATMENT   Patient Name: Patrick Brown MRN: [id number] DOB:[birth year], 51 y.o., male Today's Date: 11/16/2021   PT End of Session - 11/16/21 0906     Visit Number 11    Number of Visits 12    Date for PT Re-Evaluation 11/30/21    PT Start Time 0900    PT Stop Time 0946    PT Time Calculation (min) 46 min    Activity Tolerance Patient tolerated treatment well    Behavior During Therapy [facility] for tasks assessed/performed              Past Medical History:  Diagnosis Date   Anemia, iron deficiency On procrit   CAP (community acquired pneumonia)    CKD (chronic kidney disease) stage 3, GFR 30-59 ml/min (HCC)    Degenerative arthritis    Depression    Dyslipidemia    Gastroesophageal reflux disease    Gastroparesis diabeticorum ([place])    Hematuria, microscopic 10/09   work up negative (Dr. Amalia Hailey)   HIV positive ([place])    Hyperkalemia, diminished renal excretion 06/2011 secondary to TMP/SMZ; prior secondary to  ARBS;    Known potassium excretory defect; history of recurrent hyperkalemia due to diabetic renal disease; ACE/ARB contraindicated; hyperkalemia 06/2011 secondary to TMP-SMZ   Hypothyroidism    IDDM (insulin dependent diabetes mellitus)    38 years   Low HDL (under 40)    Proteinuria    Retinopathy    x2   SIRS (systemic inflammatory response syndrome) ([place])    Past Surgical History:  Procedure Laterality Date   CATARACT EXTRACTION Right    COLONOSCOPY     ESOPHAGOGASTRODUODENOSCOPY     EYE SURGERY  2006,2001   x2    HIP ARTHROPLASTY Right 05/10/2016   Procedure: RIGHT HIP HEMIARTHROPLASTY;  Surgeon: Marchia Bond, MD;  Location: [place];  Service: Orthopedics;  Laterality: Right;   insulin pump     LASIK Bilateral    VIDEO BRONCHOSCOPY Bilateral 12/15/2012   Procedure: VIDEO BRONCHOSCOPY WITH FLUORO;  Surgeon: Kathee Delton, MD;  [facility]: [facility];  Service: Cardiopulmonary;  Laterality: Bilateral;   VITRECTOMY   bilateral   Patient Active Problem List   Diagnosis Date Noted   PCO (posterior capsular opacification), right 07/30/2021   Mild episode of recurrent major depressive disorder ([place]) 04/04/2021   Chronic idiopathic constipation 03/23/2021   Asthmatic bronchitis 03/05/2021   Stable treated proliferative diabetic retinopathy of right eye determined by examination associated with type 1 diabetes mellitus ([place]) 11/01/2019   Stable treated proliferative diabetic retinopathy of left eye without macular edema determined by examination associated with type 1 diabetes mellitus ([place]) 11/01/2019   Exocrine pancreatic insufficiency 09/29/2019   ESRD (end stage renal disease) on dialysis ([place]) 09/09/2019   Elevated SGOT 07/14/2019   Homocystinemia 07/14/2019   Anxiety 03/17/2019   Non-seasonal allergic rhinitis due to pollen 11/06/2016   Benign prostatic hyperplasia without lower urinary tract symptoms 04/18/2016   Vitamin D deficiency 04/18/2016   OSA (obstructive sleep apnea) 10/06/2014   Pseudophakia of both eyes 06/23/2013   CAP (community acquired pneumonia) 08/05/2012   HIV disease ([place]) 06/02/2012   Convulsions/seizures ([place]) 05/16/2012   Gastroparesis 04/18/2012   Anemia, iron deficiency    Type 1 diabetes mellitus ([place]) 05/08/2011   Acquired hypothyroidism 05/08/2011   REFERRING PROVIDER: Dettinger, Fransisca Kaufmann, MD  REFERRING DIAG: Acute pain of right knee; Recurrent falls  THERAPY DIAG:  Muscle weakness (generalized)  History of falling  Rationale  for Evaluation and Treatment Rehabilitation  ONSET DATE: 3-4 weeks ago  SUBJECTIVE:   SUBJECTIVE STATEMENT: Pt denies any pain today, but does report some SHOB, 93% on RA.   PERTINENT HISTORY: DM, history of seizures, depression, HTN  PAIN:  No pain today  PATIENT GOALS improved strength and safety, navigate at least 1 step to get into his house  OBJECTIVE:   TODAY'S TREATMENT:                                11/16/21  EXERCISE LOG  Exercise Repetitions and Resistance Comments  Nustep Lvl 4 x 15 mins   LAQ 5# x20 reps   Seated Marches 5# x20 reps   Ball Squeezes X3 mins   Hip clams Red theraband x3 mins   Heel/toe raises X20 reps   Ham Curls Red tband x 20 reps bil        Blank cell = exercise not performed today   ASSESSMENT:  CLINICAL IMPRESSION: Pt arrives for today's treatment session denying any pain, but does report SHOB when ambulating into gym.  SpO2 93% on O2.  Pt requiring min cues to remain on task and proper technique throughout treatment session.  Pt denied any pain or SHOB at completion of today's treatment session.  OBJECTIVE IMPAIRMENTS Abnormal gait, decreased activity tolerance, decreased balance, decreased mobility, difficulty walking, decreased ROM, and decreased strength.   ACTIVITY LIMITATIONS squatting, stairs, transfers, and locomotion level  PARTICIPATION LIMITATIONS: driving, shopping, community activity, and yard work  PERSONAL FACTORS Fitness, Transportation, and 3+ comorbidities: DM, history of seizures, depression, HTN  are also affecting patient's functional outcome.   REHAB POTENTIAL: Fair    CLINICAL DECISION MAKING: Unstable/unpredictable  EVALUATION COMPLEXITY: High   GOALS: Goals reviewed with patient? Yes  SHORT TERM GOALS: Target date: 10/17/2021  Patient will be independent with his initial HEP.  Baseline: Goal status: MET   2.  Patient will be able to improve his TUG time to 20 seconds or less.  Baseline: 19.8 secs with UE support Goal status: MET  3.  Patient will be able to improve his five time sit to stand time to 20 seconds or less with upper extremity support.  Baseline: 18.5 secs no cane Goal status: MET  4.  Patient will be able to ambulate with proper utilization of his cane.  Baseline:  Goal status: MET  LONG TERM GOALS: Target date: 11/07/2021   Patient will be independent with his advanced HEP.  Baseline:  Goal status: IN  PROGRESS  2.  Patient will be able to independently navigate at least 1 step with 1 railing for improved ease of household navigation.  Baseline:  Goal status: IN PROGRESS  3.  Patient will be able to improve his TUG time to 15 seconds or less.  Baseline:  Goal status: IN PROGRESS  4.   Patient will be able to improve his five time sit to stand time to 15 seconds or less with upper extremity support.  Baseline:  Goal status: IN PROGRESS  PLAN: PT FREQUENCY: 2x/week  PT DURATION: 6 weeks  PLANNED INTERVENTIONS: Therapeutic exercises, Therapeutic activity, Neuromuscular re-education, Balance training, Gait training, Patient/Family education, Self Care, Joint mobilization, Stair training, and Re-evaluation  PLAN FOR NEXT SESSION: nustep, lower extremity strengthening, review HEP, and balance interventions   Kathrynn Ducking, PTA 11/16/2021, 9:47 AM

## 2021-11-17 DIAGNOSIS — Z23 Encounter for immunization: Secondary | ICD-10-CM | POA: Diagnosis not present

## 2021-11-17 DIAGNOSIS — D631 Anemia in chronic kidney disease: Secondary | ICD-10-CM | POA: Diagnosis not present

## 2021-11-17 DIAGNOSIS — D509 Iron deficiency anemia, unspecified: Secondary | ICD-10-CM | POA: Diagnosis not present

## 2021-11-17 DIAGNOSIS — E8779 Other fluid overload: Secondary | ICD-10-CM | POA: Diagnosis not present

## 2021-11-17 DIAGNOSIS — N2581 Secondary hyperparathyroidism of renal origin: Secondary | ICD-10-CM | POA: Diagnosis not present

## 2021-11-17 DIAGNOSIS — N186 End stage renal disease: Secondary | ICD-10-CM | POA: Diagnosis not present

## 2021-11-17 DIAGNOSIS — E43 Unspecified severe protein-calorie malnutrition: Secondary | ICD-10-CM | POA: Diagnosis not present

## 2021-11-18 ENCOUNTER — Other Ambulatory Visit: Payer: Self-pay | Admitting: Family Medicine

## 2021-11-19 ENCOUNTER — Ambulatory Visit: Payer: Medicare Other

## 2021-11-19 DIAGNOSIS — Z9181 History of falling: Secondary | ICD-10-CM | POA: Diagnosis not present

## 2021-11-19 DIAGNOSIS — M6281 Muscle weakness (generalized): Secondary | ICD-10-CM | POA: Diagnosis not present

## 2021-11-19 NOTE — Therapy (Signed)
OUTPATIENT PHYSICAL THERAPY LOWER EXTREMITY TREATMENT   Patient Name: Patrick Brown MRN: [id number] DOB:02-12-1970, 51 y.o., male Today's Date: 11/19/2021   PT End of Session - 11/19/21 1351     Visit Number 12    Number of Visits 16    Date for PT Re-Evaluation 01/04/22    PT Start Time 5625    PT Stop Time 6389    PT Time Calculation (min) 44 min    Activity Tolerance Patient tolerated treatment well    Behavior During Therapy WFL for tasks assessed/performed               Past Medical History:  Diagnosis Date   Anemia, iron deficiency On procrit   CAP (community acquired pneumonia)    CKD (chronic kidney disease) stage 3, GFR 30-59 ml/min (HCC)    Degenerative arthritis    Depression    Dyslipidemia    Gastroesophageal reflux disease    Gastroparesis diabeticorum ([place])    Hematuria, microscopic 10/09   work up negative (Dr. Amalia Hailey)   HIV positive ([place])    Hyperkalemia, diminished renal excretion 06/2011 secondary to TMP/SMZ; prior secondary to  ARBS;    Known potassium excretory defect; history of recurrent hyperkalemia due to diabetic renal disease; ACE/ARB contraindicated; hyperkalemia 06/2011 secondary to TMP-SMZ   Hypothyroidism    IDDM (insulin dependent diabetes mellitus)    38 years   Low HDL (under 40)    Proteinuria    Retinopathy    x2   SIRS (systemic inflammatory response syndrome) ([place])    Past Surgical History:  Procedure Laterality Date   CATARACT EXTRACTION Right    COLONOSCOPY     ESOPHAGOGASTRODUODENOSCOPY     EYE SURGERY  2006,2001   x2    HIP ARTHROPLASTY Right 05/10/2016   Procedure: RIGHT HIP HEMIARTHROPLASTY;  Surgeon: Marchia Bond, MD;  Location: [place];  Service: Orthopedics;  Laterality: Right;   insulin pump     LASIK Bilateral    VIDEO BRONCHOSCOPY Bilateral 12/15/2012   Procedure: VIDEO BRONCHOSCOPY WITH FLUORO;  Surgeon: Kathee Delton, MD;  [facility];  Service: Cardiopulmonary;  Laterality: Bilateral;    VITRECTOMY  bilateral   Patient Active Problem List   Diagnosis Date Noted   PCO (posterior capsular opacification), right 07/30/2021   Mild episode of recurrent major depressive disorder ([place]) 04/04/2021   Chronic idiopathic constipation 03/23/2021   Asthmatic bronchitis 03/05/2021   Stable treated proliferative diabetic retinopathy of right eye determined by examination associated with type 1 diabetes mellitus ([place]) 11/01/2019   Stable treated proliferative diabetic retinopathy of left eye without macular edema determined by examination associated with type 1 diabetes mellitus ([place]) 11/01/2019   Exocrine pancreatic insufficiency 09/29/2019   ESRD (end stage renal disease) on dialysis ([place]) 09/09/2019   Elevated SGOT 07/14/2019   Homocystinemia 07/14/2019   Anxiety 03/17/2019   Non-seasonal allergic rhinitis due to pollen 11/06/2016   Benign prostatic hyperplasia without lower urinary tract symptoms 04/18/2016   Vitamin D deficiency 04/18/2016   OSA (obstructive sleep apnea) 10/06/2014   Pseudophakia of both eyes 06/23/2013   CAP (community acquired pneumonia) 08/05/2012   HIV disease ([place]) 06/02/2012   Convulsions/seizures ([place]) 05/16/2012   Gastroparesis 04/18/2012   Anemia, iron deficiency    Type 1 diabetes mellitus ([place]) 05/08/2011   Acquired hypothyroidism 05/08/2011   REFERRING PROVIDER: Dettinger, Fransisca Kaufmann, MD  REFERRING DIAG: Acute pain of right knee; Recurrent falls  THERAPY DIAG:  Muscle weakness (generalized)  History of falling  Rationale for Evaluation and Treatment Rehabilitation  ONSET DATE: 3-4 weeks ago  SUBJECTIVE:   SUBJECTIVE STATEMENT: Patient reports that he is tired today due to a lot of walking at a concealed carry class. He feels that he has definitely gotten better since he first started therapy.   PERTINENT HISTORY: DM, history of seizures, depression, HTN  PAIN:  No pain today  PATIENT GOALS improved strength and safety, navigate at  least 1 step to get into his house  OBJECTIVE:   TODAY'S TREATMENT:                                   10/16 EXERCISE LOG  Exercise Repetitions and Resistance Comments  Nustep  L4 x 16 minutes    LAQ 5# x 2.5 minutes   Seated hip ADD isometric 3 minutes w/ 5 second hold   Step ups 6" step x 20 reps each 1 HHA to 2 HHA   Heel/ toe raises  2 minutes    Blank cell = exercise not performed today                                 11/16/21 EXERCISE LOG  Exercise Repetitions and Resistance Comments  Nustep Lvl 4 x 15 mins   LAQ 5# x20 reps   Seated Marches 5# x20 reps   Ball Squeezes X3 mins   Hip clams Red theraband x3 mins   Heel/toe raises X20 reps   Ham Curls Red tband x 20 reps bil        Blank cell = exercise not performed today   ASSESSMENT:  CLINICAL IMPRESSION: Patient has made good progress toward his goals as he was able to meet his goals for navigating his step at home and for an improved TUG time. However, he was not able to meet his goal for his five time sit to stand at this time. Treatment focused on step ups and familiar interventions for improved lower extremity strength. He required minimal cueing with seated hip adduction isometric for a five second hold to facilitate muscular engagement. Fatigue was his primary limitation with today's interventions as he required seated rest breaks throughout treatment. He reported feeling tired upon the conclusion of treatment. Recommend that he continue with skilled physical therapy for once a week for four weeks for improved safety and functional mobility.   OBJECTIVE IMPAIRMENTS Abnormal gait, decreased activity tolerance, decreased balance, decreased mobility, difficulty walking, decreased ROM, and decreased strength.   ACTIVITY LIMITATIONS squatting, stairs, transfers, and locomotion level  PARTICIPATION LIMITATIONS: driving, shopping, community activity, and yard work  PERSONAL FACTORS Fitness, Transportation, and 3+  comorbidities: DM, history of seizures, depression, HTN  are also affecting patient's functional outcome.   REHAB POTENTIAL: Fair    CLINICAL DECISION MAKING: Unstable/unpredictable  EVALUATION COMPLEXITY: High   GOALS: Goals reviewed with patient? Yes  SHORT TERM GOALS: Target date: 10/17/2021  Patient will be independent with his initial HEP.  Baseline: Goal status: MET   2.  Patient will be able to improve his TUG time to 20 seconds or less.  Baseline: 19.8 secs with UE support Goal status: MET  3.  Patient will be able to improve his five time sit to stand time to 20 seconds or less with upper extremity support.  Baseline: 18.5 secs no cane Goal status: MET  4.  Patient  will be able to ambulate with proper utilization of his cane.  Baseline:  Goal status: MET  LONG TERM GOALS: Target date: 11/07/2021   Patient will be independent with his advanced HEP.  Baseline:  Goal status: IN PROGRESS  2.  Patient will be able to independently navigate at least 1 step with 1 railing for improved ease of household navigation.  Baseline:  Goal status: MET  3.  Patient will be able to improve his TUG time to 15 seconds or less.  Baseline: 14.44 seconds Goal status: MET  4.   Patient will be able to improve his five time sit to stand time to 15 seconds or less with upper extremity support.  Baseline: 17.23 seconds Goal status: IN PROGRESS  5.  Patient will improve her TUG time to 12 seconds or less for improved safety and to reduce his fall risk.  Baseline:  Goal status: INITIAL   PLAN: PT FREQUENCY: 1x/week  PT DURATION: 4 weeks  PLANNED INTERVENTIONS: Therapeutic exercises, Therapeutic activity, Neuromuscular re-education, Balance training, Gait training, Patient/Family education, Self Care, Joint mobilization, Stair training, and Re-evaluation  PLAN FOR NEXT SESSION: nustep, lower extremity strengthening, review HEP, and balance interventions   Darlin Coco,  PT 11/19/2021, 3:15 PM

## 2021-11-20 DIAGNOSIS — N186 End stage renal disease: Secondary | ICD-10-CM | POA: Diagnosis not present

## 2021-11-20 DIAGNOSIS — Z23 Encounter for immunization: Secondary | ICD-10-CM | POA: Diagnosis not present

## 2021-11-20 DIAGNOSIS — E43 Unspecified severe protein-calorie malnutrition: Secondary | ICD-10-CM | POA: Diagnosis not present

## 2021-11-20 DIAGNOSIS — N2581 Secondary hyperparathyroidism of renal origin: Secondary | ICD-10-CM | POA: Diagnosis not present

## 2021-11-20 DIAGNOSIS — D631 Anemia in chronic kidney disease: Secondary | ICD-10-CM | POA: Diagnosis not present

## 2021-11-20 DIAGNOSIS — E8779 Other fluid overload: Secondary | ICD-10-CM | POA: Diagnosis not present

## 2021-11-20 DIAGNOSIS — D509 Iron deficiency anemia, unspecified: Secondary | ICD-10-CM | POA: Diagnosis not present

## 2021-11-21 DIAGNOSIS — R197 Diarrhea, unspecified: Secondary | ICD-10-CM | POA: Diagnosis not present

## 2021-11-21 DIAGNOSIS — K8689 Other specified diseases of pancreas: Secondary | ICD-10-CM | POA: Diagnosis not present

## 2021-11-21 DIAGNOSIS — K219 Gastro-esophageal reflux disease without esophagitis: Secondary | ICD-10-CM | POA: Diagnosis not present

## 2021-11-21 DIAGNOSIS — K3184 Gastroparesis: Secondary | ICD-10-CM | POA: Diagnosis not present

## 2021-11-21 DIAGNOSIS — R634 Abnormal weight loss: Secondary | ICD-10-CM | POA: Diagnosis not present

## 2021-11-21 DIAGNOSIS — N186 End stage renal disease: Secondary | ICD-10-CM | POA: Diagnosis not present

## 2021-11-22 DIAGNOSIS — D631 Anemia in chronic kidney disease: Secondary | ICD-10-CM | POA: Diagnosis not present

## 2021-11-22 DIAGNOSIS — E43 Unspecified severe protein-calorie malnutrition: Secondary | ICD-10-CM | POA: Diagnosis not present

## 2021-11-22 DIAGNOSIS — D509 Iron deficiency anemia, unspecified: Secondary | ICD-10-CM | POA: Diagnosis not present

## 2021-11-22 DIAGNOSIS — N2581 Secondary hyperparathyroidism of renal origin: Secondary | ICD-10-CM | POA: Diagnosis not present

## 2021-11-22 DIAGNOSIS — N186 End stage renal disease: Secondary | ICD-10-CM | POA: Diagnosis not present

## 2021-11-22 DIAGNOSIS — E8779 Other fluid overload: Secondary | ICD-10-CM | POA: Diagnosis not present

## 2021-11-22 DIAGNOSIS — Z23 Encounter for immunization: Secondary | ICD-10-CM | POA: Diagnosis not present

## 2021-11-23 ENCOUNTER — Encounter: Payer: Self-pay | Admitting: Family Medicine

## 2021-11-23 ENCOUNTER — Telehealth (INDEPENDENT_AMBULATORY_CARE_PROVIDER_SITE_OTHER): Payer: Medicare Other | Admitting: Family Medicine

## 2021-11-23 DIAGNOSIS — W19XXXA Unspecified fall, initial encounter: Secondary | ICD-10-CM

## 2021-11-23 DIAGNOSIS — Z992 Dependence on renal dialysis: Secondary | ICD-10-CM | POA: Diagnosis not present

## 2021-11-23 DIAGNOSIS — R29898 Other symptoms and signs involving the musculoskeletal system: Secondary | ICD-10-CM

## 2021-11-23 DIAGNOSIS — N186 End stage renal disease: Secondary | ICD-10-CM

## 2021-11-23 NOTE — Progress Notes (Signed)
Virtual Visit via Mychart video Note  I connected with Patrick Brown on 11/23/21 at 1425 by video and verified that I am speaking with the correct person using two identifiers. Patrick Brown is currently located at home and mother are currently with her during visit. The provider, Fransisca Kaufmann Vonnie Spagnolo, MD is located in their office at time of visit.  Call ended at 1432  I discussed the limitations, risks, security and privacy concerns of performing an evaluation and management service by video and the availability of in person appointments. I also discussed with the patient that there may be a patient responsible charge related to this service. The patient expressed understanding and agreed to proceed.   History and Present Illness: Patient was in physical therapy and fell and his leg left and it is hurting now. It did have road rash but that is healed. He feels like it is going to give out.  He is using cane now. He has had issues with his right leg before.   1. Right leg weakness   2. Fall, initial encounter   3. ESRD (end stage renal disease) on dialysis [facility])     Outpatient Encounter Medications as of 11/23/2021  Medication Sig   acetaminophen (TYLENOL) 500 MG tablet Take 1,000 mg by mouth 2 (two) times daily.    acyclovir (ZOVIRAX) 400 MG tablet Takes mondays, wednesdays and fridays   AgaMatrix Ultra-Thin Lancets MISC TEST BS 4 TIMES A DAY AND AS NEEDED DX E10.65   albuterol (PROVENTIL) (2.5 MG/3ML) 0.083% nebulizer solution Take 3 mLs (2.5 mg total) by nebulization every 6 (six) hours as needed for wheezing or shortness of breath.   albuterol (VENTOLIN HFA) 108 (90 Base) MCG/ACT inhaler Inhale 2 puffs into the lungs every 6 (six) hours as needed for wheezing or shortness of breath.   amLODipine (NORVASC) 10 MG tablet Take 5 mg by mouth daily.   aspirin 81 MG chewable tablet Chew 81 mg by mouth every morning.   benzonatate (TESSALON) 200 MG capsule Take 1 capsule (200 mg total) by  mouth 3 (three) times daily as needed for cough.   clopidogrel (PLAVIX) 75 MG tablet Take 40 mg by mouth daily. Take daily   cyclobenzaprine (FLEXERIL) 10 MG tablet Take 1 tablet (10 mg total) by mouth 3 (three) times daily as needed for muscle spasms.   diclofenac Sodium (VOLTAREN) 1 % GEL Apply 2 g topically 4 (four) times daily.   diphenoxylate-atropine (LOMOTIL) 2.5-0.025 MG tablet TAKE 1 TABLET BY MOUTH 4 (FOUR) TIMES DAILY AS NEEDED FOR DIARRHEA OR LOOSE STOOLS.   DOVATO 50-300 MG tablet TAKE 1 TABLET BY MOUTH DAILY   doxazosin (CARDURA) 1 MG tablet Take 1 mg by mouth daily.   dronabinol (MARINOL) 2.5 MG capsule Take 1 capsule (2.5 mg total) by mouth 2 (two) times daily before lunch and supper.   DULoxetine (CYMBALTA) 30 MG capsule Take 1 capsule (30 mg total) by mouth daily. TAKE WITH THE '60MG'$  FOR TOTAL OF '90MG'$    DULoxetine (CYMBALTA) 60 MG capsule Take 1 capsule (60 mg total) by mouth daily. Take with 30 mg fot a total of '90mg'$    febuxostat (ULORIC) 40 MG tablet Take 1 tablet (40 mg total) by mouth daily.   ferrous sulfate 325 (65 FE) MG tablet Take 650 mg by mouth daily with breakfast.    fluticasone (FLONASE) 50 MCG/ACT nasal spray SPRAY 2 SPRAYS INTO EACH NOSTRIL EVERY DAY   furosemide (LASIX) 40 MG tablet Take 40 mg by  mouth daily. 40 mg nightly   Glucosamine-Chondroit-Vit C-Mn (GLUCOSAMINE 1500 COMPLEX PO) Take 1 tablet by mouth 2 (two) times daily.    glucosamine-chondroitin 500-400 MG tablet Take by mouth.    glucose blood (ONETOUCH VERIO) test strip TEST BLOOD SUGAR 4 TIMES DAILY AND AS NEEDED   hydrALAZINE (APRESOLINE) 100 MG tablet Take by mouth.   HYDROcodone bit-homatropine (HYCODAN) 5-1.5 MG/5ML syrup Take 5 mLs by mouth every 6 (six) hours as needed for cough.   hyoscyamine (LEVSIN) 0.125 MG tablet TAKE 1 TABLET (0.125 MG TOTAL) BY MOUTH EVERY 4 (FOUR) HOURS AS NEEDED.   icosapent Ethyl (VASCEPA) 1 g capsule TAKE 2 CAPSULES BY MOUTH TWICE A DAY   insulin glargine (LANTUS)  100 UNIT/ML injection In the event of insulin pump failure, inject 8 units twice daily   insulin lispro (HUMALOG) 100 UNIT/ML injection USE 42 UNITS TO 120 UNITS PER PUMP DAILY AS DIRECTED   ipratropium (ATROVENT) 0.03 % nasal spray USE 2 SPRAYS IN EACH NOSTRIL 2-3 TIMES DAILY.   levocetirizine (XYZAL) 5 MG tablet TAKE 1 TABLET BY MOUTH EVERY DAY IN THE EVENING   levothyroxine (SYNTHROID) 300 MCG tablet Take 1 tablet (300 mcg total) by mouth daily.   lipase/protease/amylase (CREON) 12000-38000 units CPEP capsule Take 2 capsules prior to meals and 1 capsule prior to snacks   LORazepam (ATIVAN) 0.5 MG tablet Take by mouth.   losartan (COZAAR) 100 MG tablet Take 100 mg by mouth daily.   meclizine (ANTIVERT) 12.5 MG tablet Take 1 tablet (12.5 mg total) by mouth 3 (three) times daily as needed for dizziness.   metoCLOPramide (REGLAN) 5 MG tablet Take 5 mg by mouth 4 (four) times daily.   metoprolol succinate (TOPROL-XL) 50 MG 24 hr tablet Take 50 mg by mouth 2 (two) times daily.   mirtazapine (REMERON) 30 MG tablet TAKE 1 TABLET BY MOUTH AT BEDTIME.   montelukast (SINGULAIR) 10 MG tablet TAKE 1 TABLET BY MOUTH EVERYDAY AT BEDTIME   niacin (NIASPAN) 1000 MG CR tablet TAKE 1 TABLET (1,000 MG TOTAL) BY MOUTH AT BEDTIME.   ondansetron (ZOFRAN) 4 MG tablet Take 1 tablet (4 mg total) by mouth every 8 (eight) hours as needed. for nausea   pantoprazole (PROTONIX) 40 MG tablet Take 40 mg by mouth 2 (two) times daily.   Probiotic Product (MISC INTESTINAL FLORA REGULAT) CAPS Take 1 capsule by mouth every morning.   promethazine (PHENERGAN) 25 MG suppository PLACE 1 SUPPOSITORY (25 MG TOTAL) RECTALLY EVERY 6 (SIX) HOURS AS NEEDED FOR NAUSEA OR VOMITING.   rosuvastatin (CRESTOR) 20 MG tablet Take 1 tablet (20 mg total) by mouth at bedtime.   sevelamer carbonate (RENVELA) 800 MG tablet Take 800 mg by mouth 3 (three) times daily. 2 tabs before each meal   sucralfate (CARAFATE) 1 g tablet Take 1 tablet (1 g total) by  mouth 2 (two) times daily.   tamsulosin (FLOMAX) 0.4 MG CAPS capsule TAKE 1 CAPSULE BY MOUTH EVERYDAY AT BEDTIME   Testosterone 20.25 MG/ACT (1.62%) GEL APPLY 3 PUMPS DAILY AS DIRECTED   traMADol (ULTRAM) 50 MG tablet Take 1 tablet (50 mg total) by mouth daily as needed.   traZODone (DESYREL) 50 MG tablet Take by mouth.   Facility-Administered Encounter Medications as of 11/23/2021  Medication   cyanocobalamin (VITAMIN B12) injection 1,000 mcg   cyanocobalamin (VITAMIN B12) injection 1,000 mcg    Review of Systems  Constitutional:  Negative for chills and fever.  Eyes:  Negative for visual disturbance.  Respiratory:  Negative for shortness of breath and wheezing.   Cardiovascular:  Negative for chest pain and leg swelling.  Musculoskeletal:  Positive for arthralgias, gait problem and myalgias. Negative for back pain.  Skin:  Negative for rash.  All other systems reviewed and are negative.   Observations/Objective: Patient sounds comfortable and in no acute distress  Assessment and Plan: Problem List Items Addressed This Visit       Genitourinary   ESRD (end stage renal disease) on dialysis [facility])   Relevant Orders   Ambulatory referral to Physical Therapy   Other Visit Diagnoses     Right leg weakness    -  Primary   Relevant Orders   Ambulatory referral to Physical Therapy   Fall, initial encounter       Relevant Orders   Ambulatory referral to Physical Therapy       Weakness and falls, referred to PT Follow up plan: Return if symptoms worsen or fail to improve.     I discussed the assessment and treatment plan with the patient. The patient was provided an opportunity to ask questions and all were answered. The patient agreed with the plan and demonstrated an understanding of the instructions.   The patient was advised to call back or seek an in-person evaluation if the symptoms worsen or if the condition fails to improve as anticipated.  The above assessment  and management plan was discussed with the patient. The patient verbalized understanding of and has agreed to the management plan. Patient is aware to call the clinic if symptoms persist or worsen. Patient is aware when to return to the clinic for a follow-up visit. Patient educated on when it is appropriate to go to the emergency department.    I provided 7 minutes of non-face-to-face time during this encounter.    Worthy Rancher, MD

## 2021-11-24 DIAGNOSIS — D509 Iron deficiency anemia, unspecified: Secondary | ICD-10-CM | POA: Diagnosis not present

## 2021-11-24 DIAGNOSIS — N186 End stage renal disease: Secondary | ICD-10-CM | POA: Diagnosis not present

## 2021-11-24 DIAGNOSIS — Z23 Encounter for immunization: Secondary | ICD-10-CM | POA: Diagnosis not present

## 2021-11-24 DIAGNOSIS — E8779 Other fluid overload: Secondary | ICD-10-CM | POA: Diagnosis not present

## 2021-11-24 DIAGNOSIS — N2581 Secondary hyperparathyroidism of renal origin: Secondary | ICD-10-CM | POA: Diagnosis not present

## 2021-11-24 DIAGNOSIS — E43 Unspecified severe protein-calorie malnutrition: Secondary | ICD-10-CM | POA: Diagnosis not present

## 2021-11-24 DIAGNOSIS — D631 Anemia in chronic kidney disease: Secondary | ICD-10-CM | POA: Diagnosis not present

## 2021-11-26 ENCOUNTER — Ambulatory Visit: Payer: Medicare Other | Admitting: Physical Therapy

## 2021-11-27 DIAGNOSIS — E43 Unspecified severe protein-calorie malnutrition: Secondary | ICD-10-CM | POA: Diagnosis not present

## 2021-11-27 DIAGNOSIS — N186 End stage renal disease: Secondary | ICD-10-CM | POA: Diagnosis not present

## 2021-11-27 DIAGNOSIS — D509 Iron deficiency anemia, unspecified: Secondary | ICD-10-CM | POA: Diagnosis not present

## 2021-11-27 DIAGNOSIS — E8779 Other fluid overload: Secondary | ICD-10-CM | POA: Diagnosis not present

## 2021-11-27 DIAGNOSIS — N2581 Secondary hyperparathyroidism of renal origin: Secondary | ICD-10-CM | POA: Diagnosis not present

## 2021-11-27 DIAGNOSIS — D631 Anemia in chronic kidney disease: Secondary | ICD-10-CM | POA: Diagnosis not present

## 2021-11-27 DIAGNOSIS — Z23 Encounter for immunization: Secondary | ICD-10-CM | POA: Diagnosis not present

## 2021-11-28 ENCOUNTER — Telehealth: Payer: Self-pay | Admitting: Family Medicine

## 2021-11-28 DIAGNOSIS — R634 Abnormal weight loss: Secondary | ICD-10-CM | POA: Diagnosis not present

## 2021-11-28 DIAGNOSIS — K8689 Other specified diseases of pancreas: Secondary | ICD-10-CM | POA: Diagnosis not present

## 2021-11-28 NOTE — Telephone Encounter (Signed)
Going to talk to [facility]

## 2021-11-29 DIAGNOSIS — N186 End stage renal disease: Secondary | ICD-10-CM | POA: Diagnosis not present

## 2021-11-29 DIAGNOSIS — D509 Iron deficiency anemia, unspecified: Secondary | ICD-10-CM | POA: Diagnosis not present

## 2021-11-29 DIAGNOSIS — Z23 Encounter for immunization: Secondary | ICD-10-CM | POA: Diagnosis not present

## 2021-11-29 DIAGNOSIS — E8779 Other fluid overload: Secondary | ICD-10-CM | POA: Diagnosis not present

## 2021-11-29 DIAGNOSIS — N2581 Secondary hyperparathyroidism of renal origin: Secondary | ICD-10-CM | POA: Diagnosis not present

## 2021-11-29 DIAGNOSIS — D631 Anemia in chronic kidney disease: Secondary | ICD-10-CM | POA: Diagnosis not present

## 2021-11-30 ENCOUNTER — Ambulatory Visit: Payer: Medicare Other | Admitting: Physical Therapy

## 2021-12-01 DIAGNOSIS — N186 End stage renal disease: Secondary | ICD-10-CM | POA: Diagnosis not present

## 2021-12-01 DIAGNOSIS — E43 Unspecified severe protein-calorie malnutrition: Secondary | ICD-10-CM | POA: Diagnosis not present

## 2021-12-01 DIAGNOSIS — D509 Iron deficiency anemia, unspecified: Secondary | ICD-10-CM | POA: Diagnosis not present

## 2021-12-01 DIAGNOSIS — E8779 Other fluid overload: Secondary | ICD-10-CM | POA: Diagnosis not present

## 2021-12-01 DIAGNOSIS — N2581 Secondary hyperparathyroidism of renal origin: Secondary | ICD-10-CM | POA: Diagnosis not present

## 2021-12-01 DIAGNOSIS — Z23 Encounter for immunization: Secondary | ICD-10-CM | POA: Diagnosis not present

## 2021-12-01 DIAGNOSIS — D631 Anemia in chronic kidney disease: Secondary | ICD-10-CM | POA: Diagnosis not present

## 2021-12-04 DIAGNOSIS — N2581 Secondary hyperparathyroidism of renal origin: Secondary | ICD-10-CM | POA: Diagnosis not present

## 2021-12-04 DIAGNOSIS — E43 Unspecified severe protein-calorie malnutrition: Secondary | ICD-10-CM | POA: Diagnosis not present

## 2021-12-04 DIAGNOSIS — Z23 Encounter for immunization: Secondary | ICD-10-CM | POA: Diagnosis not present

## 2021-12-04 DIAGNOSIS — N186 End stage renal disease: Secondary | ICD-10-CM | POA: Diagnosis not present

## 2021-12-04 DIAGNOSIS — Z992 Dependence on renal dialysis: Secondary | ICD-10-CM | POA: Diagnosis not present

## 2021-12-04 DIAGNOSIS — D631 Anemia in chronic kidney disease: Secondary | ICD-10-CM | POA: Diagnosis not present

## 2021-12-04 DIAGNOSIS — D509 Iron deficiency anemia, unspecified: Secondary | ICD-10-CM | POA: Diagnosis not present

## 2021-12-04 DIAGNOSIS — E8779 Other fluid overload: Secondary | ICD-10-CM | POA: Diagnosis not present

## 2021-12-05 ENCOUNTER — Ambulatory Visit: Payer: Medicare Other | Attending: Family Medicine

## 2021-12-05 DIAGNOSIS — Z992 Dependence on renal dialysis: Secondary | ICD-10-CM | POA: Diagnosis not present

## 2021-12-05 DIAGNOSIS — R29898 Other symptoms and signs involving the musculoskeletal system: Secondary | ICD-10-CM | POA: Insufficient documentation

## 2021-12-05 DIAGNOSIS — M6281 Muscle weakness (generalized): Secondary | ICD-10-CM | POA: Diagnosis not present

## 2021-12-05 DIAGNOSIS — Z9181 History of falling: Secondary | ICD-10-CM | POA: Diagnosis not present

## 2021-12-05 DIAGNOSIS — W19XXXA Unspecified fall, initial encounter: Secondary | ICD-10-CM | POA: Insufficient documentation

## 2021-12-05 DIAGNOSIS — N186 End stage renal disease: Secondary | ICD-10-CM | POA: Insufficient documentation

## 2021-12-05 DIAGNOSIS — X58XXXA Exposure to other specified factors, initial encounter: Secondary | ICD-10-CM | POA: Insufficient documentation

## 2021-12-05 NOTE — Therapy (Signed)
OUTPATIENT PHYSICAL THERAPY LOWER EXTREMITY TREATMENT   Patient Name: Patrick Brown MRN: [id number] DOB:03-30-1970, 51 y.o., male Today's Date: 12/05/2021   PT End of Session - 12/05/21 1313     Visit Number 13    Number of Visits 16    Date for PT Re-Evaluation 01/04/22    PT Start Time 1300    PT Stop Time 2330    PT Time Calculation (min) 45 min    Activity Tolerance Patient tolerated treatment well    Behavior During Therapy WFL for tasks assessed/performed               Past Medical History:  Diagnosis Date   Anemia, iron deficiency On procrit   CAP (community acquired pneumonia)    CKD (chronic kidney disease) stage 3, GFR 30-59 ml/min (HCC)    Degenerative arthritis    Depression    Dyslipidemia    Gastroesophageal reflux disease    Gastroparesis diabeticorum ([place])    Hematuria, microscopic 10/09   work up negative (Dr. Amalia Hailey)   HIV positive ([place])    Hyperkalemia, diminished renal excretion 06/2011 secondary to TMP/SMZ; prior secondary to  ARBS;    Known potassium excretory defect; history of recurrent hyperkalemia due to diabetic renal disease; ACE/ARB contraindicated; hyperkalemia 06/2011 secondary to TMP-SMZ   Hypothyroidism    IDDM (insulin dependent diabetes mellitus)    38 years   Low HDL (under 40)    Proteinuria    Retinopathy    x2   SIRS (systemic inflammatory response syndrome) ([place])    Past Surgical History:  Procedure Laterality Date   CATARACT EXTRACTION Right    COLONOSCOPY     ESOPHAGOGASTRODUODENOSCOPY     EYE SURGERY  2006,2001   x2    HIP ARTHROPLASTY Right 05/10/2016   Procedure: RIGHT HIP HEMIARTHROPLASTY;  Surgeon: Marchia Bond, MD;  Location: [facility];  Service: Orthopedics;  Laterality: Right;   insulin pump     LASIK Bilateral    VIDEO BRONCHOSCOPY Bilateral 12/15/2012   Procedure: VIDEO BRONCHOSCOPY WITH FLUORO;  Surgeon: Kathee Delton, MD;  Location: [facility];  Service: Cardiopulmonary;  Laterality: Bilateral;    VITRECTOMY  bilateral   Patient Active Problem List   Diagnosis Date Noted   PCO (posterior capsular opacification), right 07/30/2021   Mild episode of recurrent major depressive disorder ([place]) 04/04/2021   Chronic idiopathic constipation 03/23/2021   Asthmatic bronchitis 03/05/2021   Stable treated proliferative diabetic retinopathy of right eye determined by examination associated with type 1 diabetes mellitus ([place]) 11/01/2019   Stable treated proliferative diabetic retinopathy of left eye without macular edema determined by examination associated with type 1 diabetes mellitus ([place]) 11/01/2019   Exocrine pancreatic insufficiency 09/29/2019   ESRD (end stage renal disease) on dialysis ([place]) 09/09/2019   Elevated SGOT 07/14/2019   Homocystinemia 07/14/2019   Anxiety 03/17/2019   Non-seasonal allergic rhinitis due to pollen 11/06/2016   Benign prostatic hyperplasia without lower urinary tract symptoms 04/18/2016   Vitamin D deficiency 04/18/2016   OSA (obstructive sleep apnea) 10/06/2014   Pseudophakia of both eyes 06/23/2013   CAP (community acquired pneumonia) 08/05/2012   HIV disease ([place]) 06/02/2012   Convulsions/seizures ([place]) 05/16/2012   Gastroparesis 04/18/2012   Anemia, iron deficiency    Type 1 diabetes mellitus ([place]) 05/08/2011   Acquired hypothyroidism 05/08/2011   REFERRING PROVIDER: Dettinger, Fransisca Kaufmann, MD  REFERRING DIAG: Acute pain of right knee; Recurrent falls  THERAPY DIAG:  Muscle weakness (generalized)  History of falling  Rationale for Evaluation and Treatment Rehabilitation  ONSET DATE: 3-4 weeks ago  SUBJECTIVE:   SUBJECTIVE STATEMENT: Pt arrives for today's treatment feeling good.   PERTINENT HISTORY: DM, history of seizures, depression, HTN  PAIN:  No pain today  PATIENT GOALS improved strength and safety, navigate at least 1 step to get into his house  OBJECTIVE:   TODAY'S TREATMENT:                                   11/1 EXERCISE  LOG  Exercise Repetitions and Resistance Comments  Nustep  L4 x 20 minutes    LAQ 5# x 3 minutes   Seated hip ADD isometric 3 minutes w/ 5 second hold   Step ups 6" step x 3 mins 1 HHA to 2 HHA   Heel/ toe raises  X 3 minutes    Blank cell = exercise not performed today                                   ASSESSMENT:  CLINICAL IMPRESSION: Pt arrives for today's treatment session denying any pain.  Pt bale to tolerate increase time with forward step-ups and LAQ.  Pt reports performing exercises at home as previously instructed and is able to get up out of chair and restaurant booths without assistance.  Pt denies any pain at completion of today's treatment session, but does endorse increased fatigue.   OBJECTIVE IMPAIRMENTS Abnormal gait, decreased activity tolerance, decreased balance, decreased mobility, difficulty walking, decreased ROM, and decreased strength.   ACTIVITY LIMITATIONS squatting, stairs, transfers, and locomotion level  PARTICIPATION LIMITATIONS: driving, shopping, community activity, and yard work  PERSONAL FACTORS Fitness, Transportation, and 3+ comorbidities: DM, history of seizures, depression, HTN  are also affecting patient's functional outcome.   REHAB POTENTIAL: Fair    CLINICAL DECISION MAKING: Unstable/unpredictable  EVALUATION COMPLEXITY: High   GOALS: Goals reviewed with patient? Yes  SHORT TERM GOALS: Target date: 10/17/2021  Patient will be independent with his initial HEP.  Baseline: Goal status: MET   2.  Patient will be able to improve his TUG time to 20 seconds or less.  Baseline: 19.8 secs with UE support Goal status: MET  3.  Patient will be able to improve his five time sit to stand time to 20 seconds or less with upper extremity support.  Baseline: 18.5 secs no cane Goal status: MET  4.  Patient will be able to ambulate with proper utilization of his cane.  Baseline:  Goal status: MET  LONG TERM GOALS: Target date: 11/07/2021    Patient will be independent with his advanced HEP.  Baseline:  Goal status: IN PROGRESS  2.  Patient will be able to independently navigate at least 1 step with 1 railing for improved ease of household navigation.  Baseline:  Goal status: MET  3.  Patient will be able to improve his TUG time to 15 seconds or less.  Baseline: 14.44 seconds Goal status: MET  4.   Patient will be able to improve his five time sit to stand time to 15 seconds or less with upper extremity support.  Baseline: 17.23 seconds Goal status: IN PROGRESS  5.  Patient will improve her TUG time to 12 seconds or less for improved safety and to reduce his fall risk.  Baseline:  Goal status: INITIAL   PLAN:  PT FREQUENCY: 1x/week  PT DURATION: 4 weeks  PLANNED INTERVENTIONS: Therapeutic exercises, Therapeutic activity, Neuromuscular re-education, Balance training, Gait training, Patient/Family education, Self Care, Joint mobilization, Stair training, and Re-evaluation  PLAN FOR NEXT SESSION: nustep, lower extremity strengthening, review HEP, and balance interventions   Kathrynn Ducking, PTA 12/05/2021, 2:12 PM

## 2021-12-06 DIAGNOSIS — D509 Iron deficiency anemia, unspecified: Secondary | ICD-10-CM | POA: Diagnosis not present

## 2021-12-06 DIAGNOSIS — D631 Anemia in chronic kidney disease: Secondary | ICD-10-CM | POA: Diagnosis not present

## 2021-12-06 DIAGNOSIS — E119 Type 2 diabetes mellitus without complications: Secondary | ICD-10-CM | POA: Diagnosis not present

## 2021-12-06 DIAGNOSIS — E43 Unspecified severe protein-calorie malnutrition: Secondary | ICD-10-CM | POA: Diagnosis not present

## 2021-12-06 DIAGNOSIS — E8779 Other fluid overload: Secondary | ICD-10-CM | POA: Diagnosis not present

## 2021-12-06 DIAGNOSIS — N2581 Secondary hyperparathyroidism of renal origin: Secondary | ICD-10-CM | POA: Diagnosis not present

## 2021-12-06 DIAGNOSIS — N186 End stage renal disease: Secondary | ICD-10-CM | POA: Diagnosis not present

## 2021-12-08 DIAGNOSIS — D509 Iron deficiency anemia, unspecified: Secondary | ICD-10-CM | POA: Diagnosis not present

## 2021-12-08 DIAGNOSIS — N186 End stage renal disease: Secondary | ICD-10-CM | POA: Diagnosis not present

## 2021-12-08 DIAGNOSIS — E43 Unspecified severe protein-calorie malnutrition: Secondary | ICD-10-CM | POA: Diagnosis not present

## 2021-12-08 DIAGNOSIS — N2581 Secondary hyperparathyroidism of renal origin: Secondary | ICD-10-CM | POA: Diagnosis not present

## 2021-12-08 DIAGNOSIS — D631 Anemia in chronic kidney disease: Secondary | ICD-10-CM | POA: Diagnosis not present

## 2021-12-08 DIAGNOSIS — E8779 Other fluid overload: Secondary | ICD-10-CM | POA: Diagnosis not present

## 2021-12-10 ENCOUNTER — Other Ambulatory Visit: Payer: Self-pay | Admitting: Family Medicine

## 2021-12-11 DIAGNOSIS — N2581 Secondary hyperparathyroidism of renal origin: Secondary | ICD-10-CM | POA: Diagnosis not present

## 2021-12-11 DIAGNOSIS — N186 End stage renal disease: Secondary | ICD-10-CM | POA: Diagnosis not present

## 2021-12-11 DIAGNOSIS — D631 Anemia in chronic kidney disease: Secondary | ICD-10-CM | POA: Diagnosis not present

## 2021-12-11 DIAGNOSIS — E8779 Other fluid overload: Secondary | ICD-10-CM | POA: Diagnosis not present

## 2021-12-11 DIAGNOSIS — D509 Iron deficiency anemia, unspecified: Secondary | ICD-10-CM | POA: Diagnosis not present

## 2021-12-11 DIAGNOSIS — E43 Unspecified severe protein-calorie malnutrition: Secondary | ICD-10-CM | POA: Diagnosis not present

## 2021-12-12 ENCOUNTER — Ambulatory Visit (INDEPENDENT_AMBULATORY_CARE_PROVIDER_SITE_OTHER): Payer: Medicare Other

## 2021-12-12 ENCOUNTER — Ambulatory Visit: Payer: Medicare Other | Admitting: Physical Therapy

## 2021-12-12 ENCOUNTER — Encounter: Payer: Self-pay | Admitting: Family Medicine

## 2021-12-12 ENCOUNTER — Ambulatory Visit (INDEPENDENT_AMBULATORY_CARE_PROVIDER_SITE_OTHER): Payer: Medicare Other | Admitting: Family Medicine

## 2021-12-12 VITALS — BP 146/67 | HR 69 | Temp 97.0°F | Ht 70.0 in | Wt 188.6 lb

## 2021-12-12 DIAGNOSIS — J90 Pleural effusion, not elsewhere classified: Secondary | ICD-10-CM | POA: Diagnosis not present

## 2021-12-12 DIAGNOSIS — J849 Interstitial pulmonary disease, unspecified: Secondary | ICD-10-CM | POA: Diagnosis not present

## 2021-12-12 DIAGNOSIS — J189 Pneumonia, unspecified organism: Secondary | ICD-10-CM

## 2021-12-12 DIAGNOSIS — R058 Other specified cough: Secondary | ICD-10-CM | POA: Diagnosis not present

## 2021-12-12 DIAGNOSIS — R059 Cough, unspecified: Secondary | ICD-10-CM | POA: Diagnosis not present

## 2021-12-12 MED ORDER — HYDROCODONE BIT-HOMATROP MBR 5-1.5 MG/5ML PO SOLN: 5.0000 mL | Freq: Four times a day (QID) | ORAL | 0 refills | Status: AC | PRN

## 2021-12-12 MED ORDER — DOXYCYCLINE HYCLATE 100 MG PO TABS: 100.0000 mg | ORAL_TABLET | Freq: Two times a day (BID) | ORAL | 0 refills | Status: DC

## 2021-12-12 MED ORDER — LEVOFLOXACIN 500 MG PO TABS: 500.0000 mg | ORAL_TABLET | ORAL | 0 refills | Status: AC

## 2021-12-12 MED ORDER — METHYLPREDNISOLONE ACETATE 40 MG/ML IJ SUSP: 40.0000 mg | Freq: Once | INTRAMUSCULAR | Status: DC

## 2021-12-12 NOTE — Patient Instructions (Signed)
Hospital-Acquired Pneumonia  Hospital-acquired pneumonia is a lung infection that a person can get in a health care setting or during certain procedures. The infection causes air pouches (sacs) inside the lungs to fill with pus or fluid. Hospital-acquired pneumonia is usually caused by bacteria that are common in health care settings. This type of pneumonia is more serious because these bacteria may not be killed by (may be resistant to) common antibiotic medicines. What are the causes? This condition is caused by bacteria that get into your lungs. You can get this condition by: Breathing in droplets from an infected person's cough or sneeze. Touching something that an infected person coughed or sneezed on and then touching your mouth, nose, or eyes. Having a bacterial infection somewhere else in your body that spreads to your lungs through your blood. What increases the risk? The following factors may make you more likely to develop this condition: Having a disease that weakens your body's defense system (immune system) or your ability to cough out germs. Being older than age 37. Having trouble swallowing. Using a feeding or breathing tube, or having an IV inserted in a vein. Having been in the hospital for two or more days in the past three months, or having been in an intensive care unit (ICU). Living in a long-term care facility, such as a nursing home. Having your kidneys filtered through hemodialysis in the past 30 days. What are the signs or symptoms? Symptoms of this condition include: Fever. Chills. Cough. Shortness of breath. Making high-pitched whistling sounds when you breathe, most often when you breathe out (wheezing). How is this diagnosed? This condition may be diagnosed based on: Your symptoms. Imaging tests, such as a chest x-ray or a CT scan. Measuring how much oxygen is in your blood. Tests on blood or mucus from your lungs (sputum). How is this treated? This  condition is treated with antibiotics. Your health care provider may use a test on your sputum to find out what type of bacteria is in your lungs. Your antibiotic may change based on the results. You may need to be treated at the hospital if you have bacteria in your blood, trouble breathing, or a low oxygen level. At the hospital, you will be given antibiotics through an IV. You may also be given oxygen or breathing treatments. Follow these instructions at home: Activity Rest as told by your health care provider. Return to your normal activities as told by your health care provider. Ask your health care provider what activities are safe for you. Lifestyle Do not use any products that contain nicotine or tobacco. These products include cigarettes, chewing tobacco, and vaping devices, such as e-cigarettes. If you need help quitting, ask your health care provider. Do not drink alcohol if: Your health care provider tells you not to drink. You are pregnant, may be pregnant, or are planning to become pregnant. If you drink alcohol: Limit how much you have to: 0-1 drink a day for women. 0-2 drinks a day for men. Know how much alcohol is in your drink. In the [place] one drink equals one 12 oz bottle of beer (355 mL), one 5 oz glass of wine (148 mL), or one 1 1?2 oz glass of hard liquor (44 mL). General instructions  Take over-the-counter and prescription medicines as told by your health care provider. Finish all antibiotic medicine even when you start to feel better. Drink enough fluid to keep your urine pale yellow. Keep all follow-up visits. This is important. How  is this prevented? To lower your risk of getting this condition again: Do not smoke, including e-cigarettes, or drink too much alcohol. Keep your immune system healthy by eating well, drinking fluids, and getting enough sleep. Get a flu shot every year (annually), and get a pneumonia shot if: You are older than age 99. You smoke. This  includes e-cigarettes. You have a long-term (chronic) condition such as lung disease or diabetes. Exercise your lungs by taking deep breaths, walking, and using an incentive spirometer as told. Practice good hygiene and ask others to practice good hygiene. Wash your hands often for at least 20 seconds with soap and water. If soap and water are not available, use an alcohol-based hand sanitizer. Make sure your health care providers wash their hands. Ask them to wash their hands if you do not see them doing so. When you are in a health care facility, avoid touching your eyes, nose, and mouth, or any surface near where people have coughed or sneezed. Stand away from sick people when they are coughing or sneezing. Wear a mask if you cannot avoid exposure to people who are sick. Clean all surfaces often with a cleanser that kills germs (disinfectant), especially if someone is sick at home or work. Precautions of my health care team Hospitals, nursing homes, and other health care facilities take steps to try to prevent hospital-acquired pneumonia. To do this, your health care team may: Clean their hands for at least 20 seconds with soap and water or with alcohol-based hand sanitizer before and after caring for sick people. Wear gloves or masks during treatment. Sanitize medical instruments, tubes, other equipment, and surfaces in hospital or clinic rooms. Raise the head of your hospital bed so you are not lying flat. The head of the bed may be raised 30 degrees or more. Have you sit up and move around as soon as possible after surgery. Insert a breathing tube only if needed. If you have a breathing tube, they will: Clean the inside of your mouth regularly. Remove the breathing tube as soon as it is no longer needed. Contact a health care provider if: Your symptoms do not get better or they get worse. Your symptoms come back after you have finished your antibiotics. Get help right away if: You have  trouble breathing. You have confusion or trouble thinking. These symptoms may be an emergency. Get help right away. Call 911. Do not wait to see if the symptoms will go away. Do not drive yourself to the hospital. Summary Hospital-acquired pneumonia is a lung infection usually caused by bacteria common in health care settings. Take over-the-counter and prescription medicines as told by your health care provider. Finish all antibiotic medicine even when you start to feel better Do not smoke. Eat well, get plenty of rest and fluids, wash your hands often, and keep all follow-up visits. This information is not intended to replace advice given to you by your health care provider. Make sure you discuss any questions you have with your health care provider. Document Revised: 02/11/2021 Document Reviewed: 02/11/2021 [employer] Patient Education  [employer] [place].

## 2021-12-12 NOTE — Progress Notes (Signed)
Subjective: CC: Cough PCP: Dettinger, Fransisca Kaufmann, MD Patrick Brown is a 51 y.o. male presenting to clinic today for:  1.  Cough Patient reports that he has had about a 3-week history of cough.  Denies any hemoptysis or brown sputum.  No fevers.  Occasional wheezing reported.  Shortness of breath is present with coughing but otherwise denies any shortness of breath out of baseline.  He was prescribed antibiotics back in September and this did seem to help but symptoms returned about 3 weeks ago.  He is brought to the office today by his family member who notes that he has a history of recurrent pneumonia.  Patient's medical history significant for ESRD on hemodialysis, type 2 diabetes and interstitial lung disease.   ROS: Per HPI  Allergies  Allergen Reactions   Sulfa Antibiotics Other (See Comments)    High potassium   Ramipril Cough   Versed [Midazolam] Other (See Comments)    "I don't wake up very good or clear it out of my system"   Past Medical History:  Diagnosis Date   Anemia, iron deficiency On procrit   CAP (community acquired pneumonia)    CKD (chronic kidney disease) stage 3, GFR 30-59 ml/min (HCC)    Degenerative arthritis    Depression    Dyslipidemia    Gastroesophageal reflux disease    Gastroparesis diabeticorum (HCC)    Hematuria, microscopic 10/09   work up negative (Dr. Amalia Hailey)   HIV positive [facility])    Hyperkalemia, diminished renal excretion 06/2011 secondary to TMP/SMZ; prior secondary to  ARBS;    Known potassium excretory defect; history of recurrent hyperkalemia due to diabetic renal disease; ACE/ARB contraindicated; hyperkalemia 06/2011 secondary to TMP-SMZ   Hypothyroidism    IDDM (insulin dependent diabetes mellitus)    38 years   Low HDL (under 40)    Proteinuria    Retinopathy    x2   SIRS (systemic inflammatory response syndrome) (HCC)     Current Outpatient Medications:    acetaminophen (TYLENOL) 500 MG tablet, Take 1,000 mg by mouth 2 (two)  times daily. , Disp: , Rfl:    acyclovir (ZOVIRAX) 400 MG tablet, Takes mondays, wednesdays and fridays, Disp: , Rfl:    AgaMatrix Ultra-Thin Lancets MISC, TEST BS 4 TIMES A DAY AND AS NEEDED DX E10.65, Disp: 400 each, Rfl: 3   albuterol (PROVENTIL) (2.5 MG/3ML) 0.083% nebulizer solution, Take 3 mLs (2.5 mg total) by nebulization every 6 (six) hours as needed for wheezing or shortness of breath., Disp: 75 mL, Rfl: 12   albuterol (VENTOLIN HFA) 108 (90 Base) MCG/ACT inhaler, Inhale 2 puffs into the lungs every 6 (six) hours as needed for wheezing or shortness of breath., Disp: 8 g, Rfl: 5   amLODipine (NORVASC) 10 MG tablet, Take 5 mg by mouth daily., Disp: , Rfl:    aspirin 81 MG chewable tablet, Chew 81 mg by mouth every morning., Disp: , Rfl:    benzonatate (TESSALON) 200 MG capsule, Take 1 capsule (200 mg total) by mouth 3 (three) times daily as needed for cough., Disp: 30 capsule, Rfl: 2   clopidogrel (PLAVIX) 75 MG tablet, Take 40 mg by mouth daily. Take daily, Disp: , Rfl:    cyclobenzaprine (FLEXERIL) 10 MG tablet, Take 1 tablet (10 mg total) by mouth 3 (three) times daily as needed for muscle spasms., Disp: 30 tablet, Rfl: 3   diclofenac Sodium (VOLTAREN) 1 % GEL, Apply 2 g topically 4 (four) times daily., Disp: 50 g,  Rfl: 1   diphenoxylate-atropine (LOMOTIL) 2.5-0.025 MG tablet, TAKE 1 TABLET BY MOUTH 4 (FOUR) TIMES DAILY AS NEEDED FOR DIARRHEA OR LOOSE STOOLS., Disp: 60 tablet, Rfl: 3   DOVATO 50-300 MG tablet, TAKE 1 TABLET BY MOUTH DAILY, Disp: 30 tablet, Rfl: 8   doxazosin (CARDURA) 1 MG tablet, Take 1 mg by mouth daily., Disp: , Rfl:    dronabinol (MARINOL) 2.5 MG capsule, Take 1 capsule (2.5 mg total) by mouth 2 (two) times daily before lunch and supper., Disp: 60 capsule, Rfl: 3   DULoxetine (CYMBALTA) 30 MG capsule, Take 1 capsule (30 mg total) by mouth daily. TAKE WITH THE '60MG'$  FOR TOTAL OF '90MG'$ , Disp: 90 capsule, Rfl: 3   DULoxetine (CYMBALTA) 60 MG capsule, TAKE 1 CAPSULE (60 MG  TOTAL) BY MOUTH DAILY. TAKE WITH 30 MG FOT A TOTAL OF '90MG'$ , Disp: 90 capsule, Rfl: 0   febuxostat (ULORIC) 40 MG tablet, Take 1 tablet (40 mg total) by mouth daily., Disp: 90 tablet, Rfl: 3   ferrous sulfate 325 (65 FE) MG tablet, Take 650 mg by mouth daily with breakfast. , Disp: , Rfl:    fluticasone (FLONASE) 50 MCG/ACT nasal spray, SPRAY 2 SPRAYS INTO EACH NOSTRIL EVERY DAY, Disp: 48 mL, Rfl: 1   furosemide (LASIX) 40 MG tablet, Take 40 mg by mouth daily. 40 mg nightly, Disp: , Rfl: 4   Glucosamine-Chondroit-Vit C-Mn (GLUCOSAMINE 1500 COMPLEX PO), Take 1 tablet by mouth 2 (two) times daily. , Disp: , Rfl:    glucosamine-chondroitin 500-400 MG tablet, Take by mouth. , Disp: , Rfl:    glucose blood (ONETOUCH VERIO) test strip, TEST BLOOD SUGAR 4 TIMES DAILY AND AS NEEDED, Disp: 300 each, Rfl: 4   hydrALAZINE (APRESOLINE) 100 MG tablet, Take by mouth., Disp: , Rfl:    HYDROcodone bit-homatropine (HYCODAN) 5-1.5 MG/5ML syrup, Take 5 mLs by mouth every 6 (six) hours as needed for cough., Disp: 240 mL, Rfl: 0   hyoscyamine (LEVSIN) 0.125 MG tablet, TAKE 1 TABLET (0.125 MG TOTAL) BY MOUTH EVERY 4 (FOUR) HOURS AS NEEDED., Disp: 60 tablet, Rfl: 2   icosapent Ethyl (VASCEPA) 1 g capsule, TAKE 2 CAPSULES BY MOUTH TWICE A DAY, Disp: 120 capsule, Rfl: 1   insulin glargine (LANTUS) 100 UNIT/ML injection, In the event of insulin pump failure, inject 8 units twice daily, Disp: , Rfl:    insulin lispro (HUMALOG) 100 UNIT/ML injection, USE 42 UNITS TO 120 UNITS PER PUMP DAILY AS DIRECTED, Disp: 90 mL, Rfl: 3   ipratropium (ATROVENT) 0.03 % nasal spray, USE 2 SPRAYS IN EACH NOSTRIL 2-3 TIMES DAILY., Disp: 90 mL, Rfl: 0   levocetirizine (XYZAL) 5 MG tablet, TAKE 1 TABLET BY MOUTH EVERY DAY IN THE EVENING, Disp: 90 tablet, Rfl: 3   levothyroxine (SYNTHROID) 300 MCG tablet, Take 1 tablet (300 mcg total) by mouth daily., Disp: 90 tablet, Rfl: 1   lipase/protease/amylase (CREON) 12000-38000 units CPEP capsule, Take 2  capsules prior to meals and 1 capsule prior to snacks, Disp: , Rfl:    LORazepam (ATIVAN) 0.5 MG tablet, Take by mouth., Disp: , Rfl:    losartan (COZAAR) 100 MG tablet, Take 100 mg by mouth daily., Disp: , Rfl:    meclizine (ANTIVERT) 12.5 MG tablet, Take 1 tablet (12.5 mg total) by mouth 3 (three) times daily as needed for dizziness., Disp: 30 tablet, Rfl: 3   metoCLOPramide (REGLAN) 5 MG tablet, Take 5 mg by mouth 4 (four) times daily., Disp: , Rfl:  metoprolol succinate (TOPROL-XL) 50 MG 24 hr tablet, Take 50 mg by mouth 2 (two) times daily., Disp: , Rfl:    mirtazapine (REMERON) 30 MG tablet, TAKE 1 TABLET BY MOUTH AT BEDTIME., Disp: 90 tablet, Rfl: 0   montelukast (SINGULAIR) 10 MG tablet, TAKE 1 TABLET BY MOUTH EVERYDAY AT BEDTIME, Disp: 90 tablet, Rfl: 2   niacin (NIASPAN) 1000 MG CR tablet, TAKE 1 TABLET (1,000 MG TOTAL) BY MOUTH AT BEDTIME., Disp: 30 tablet, Rfl: 5   ondansetron (ZOFRAN) 4 MG tablet, Take 1 tablet (4 mg total) by mouth every 8 (eight) hours as needed. for nausea, Disp: 45 tablet, Rfl: 6   pantoprazole (PROTONIX) 40 MG tablet, Take 40 mg by mouth 2 (two) times daily., Disp: , Rfl:    Probiotic Product (MISC INTESTINAL FLORA REGULAT) CAPS, Take 1 capsule by mouth every morning., Disp: , Rfl:    promethazine (PHENERGAN) 25 MG suppository, PLACE 1 SUPPOSITORY (25 MG TOTAL) RECTALLY EVERY 6 (SIX) HOURS AS NEEDED FOR NAUSEA OR VOMITING., Disp: 30 suppository, Rfl: 1   rosuvastatin (CRESTOR) 20 MG tablet, Take 1 tablet (20 mg total) by mouth at bedtime., Disp: 90 tablet, Rfl: 3   sevelamer carbonate (RENVELA) 800 MG tablet, Take 800 mg by mouth 3 (three) times daily. 2 tabs before each meal, Disp: , Rfl:    sucralfate (CARAFATE) 1 g tablet, Take 1 tablet (1 g total) by mouth 2 (two) times daily., Disp: 60 tablet, Rfl: 1   tamsulosin (FLOMAX) 0.4 MG CAPS capsule, TAKE 1 CAPSULE BY MOUTH EVERYDAY AT BEDTIME, Disp: 90 capsule, Rfl: 0   Testosterone 20.25 MG/ACT (1.62%) GEL, APPLY  3 PUMPS DAILY AS DIRECTED, Disp: 75 g, Rfl: 1   traMADol (ULTRAM) 50 MG tablet, Take 1 tablet (50 mg total) by mouth daily as needed., Disp: 10 tablet, Rfl: 2   traZODone (DESYREL) 50 MG tablet, Take by mouth., Disp: , Rfl:   Current Facility-Administered Medications:    cyanocobalamin (VITAMIN B12) injection 1,000 mcg, 1,000 mcg, Intramuscular, Q30 days, Dettinger, Fransisca Kaufmann, MD   cyanocobalamin (VITAMIN B12) injection 1,000 mcg, 1,000 mcg, Intramuscular, Q30 days, Dettinger, Fransisca Kaufmann, MD, 1,000 mcg at 11/07/21 1432 Social History   Socioeconomic History   Marital status: Single    Spouse name: Not on file   Number of children: 0   Years of education: AAS   Highest education level: Not on file  Occupational History   Occupation: EMT - former    Employer: [employer]   Occupation: teaching    Comment: part time  Tobacco Use   Smoking status: Never   Smokeless tobacco: Never  Vaping Use   Vaping Use: Never used  Substance and Sexual Activity   Alcohol use: No    Alcohol/week: 0.0 standard drinks of alcohol    Comment: Infrequent, less than 1 x per month.   Drug use: No   Sexual activity: Not Currently    Comment: declined condoms  Other Topics Concern   Not on file  Social History Narrative   Single.  Lives at home with mom.  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   Social Determinants of Health   Financial Resource Strain: Low Risk  (04/27/2021)   Overall Financial Resource Strain (CARDIA)    Difficulty of Paying Living Expenses: Not hard at all  Food Insecurity: No Food Insecurity (04/27/2021)   Hunger Vital Sign    Worried About Running Out of  Food in the Last Year: Never true    [place] in the Last Year: Never true  Transportation Needs: No Transportation Needs (04/27/2021)   PRAPARE - Hydrologist (Medical): No    Lack of Transportation (Non-Medical): No  Physical Activity:  Inactive (04/27/2021)   Exercise Vital Sign    Days of Exercise per Week: 0 days    Minutes of Exercise per Session: 0 min  Stress: Stress Concern Present (04/27/2021)   [place]    Feeling of Stress : To some extent  Social Connections: Moderately Integrated (04/27/2021)   Social Connection and Isolation Panel [NHANES]    Frequency of Communication with Friends and Family: More than three times a week    Frequency of Social Gatherings with Friends and Family: More than three times a week    Attends Religious Services: More than 4 times per year    Active Member of [employer] or Organizations: Yes    Attends Archivist Meetings: More than 4 times per year    Marital Status: Never married  Intimate Partner Violence: Not At Risk (04/27/2021)   Humiliation, Afraid, Rape, and Kick questionnaire    Fear of Current or Ex-Partner: No    Emotionally Abused: No    Physically Abused: No    Sexually Abused: No   Family History  Problem Relation Age of Onset   Diabetes type I Brother    Prostate cancer Other    Dementia Other    Dementia Other    Dementia Other     Objective: Office vital signs reviewed. BP (!) 146/67   Pulse 69   Temp (!) 97 F (36.1 C)   Ht '5\' 10"'$  (1.778 m)   Wt 188 lb 9.6 oz (85.5 kg)   SpO2 95%   BMI 27.06 kg/m   Physical Examination:  General: Awake, alert, chronically ill-appearing male, No acute distress HEENT: sclera white, MMM Cardio: regular rate and rhythm, S1S2 heard, no murmurs appreciated Pulm: Normal work of breathing on room air but globally decreased breath sounds throughout.  DG Chest 2 View  Result Date: 12/12/2021 CLINICAL DATA:  Provided history: Cough, greater than 3 weeks. History of recurrent pneumonia. EXAM: CHEST - 2 VIEW COMPARISON:  Chest CT 07/05/2021. Prior chest radiographs 04/11/2021 and FINDINGS: The cardiac silhouette is partially obscured. Within this  limitation, heart size appears within normal limits. Bilateral pleural effusions (small right, small-to-moderate left). Additional opacities at the left greater than right lung bases, which may reflect atelectasis and/or consolidation. Superimposed 1.6 cm nodular opacity at the left lung base. No evidence of pneumothorax. No acute bony abnormality identified. These results will be called to the ordering clinician or representative by the Radiologist Assistant, and communication documented in the PACS or [employer]. IMPRESSION: 1.6 cm nodular opacity at the left lung base, suspicious for a pulmonary nodule. A chest CT (preferably with contrast) is recommended for further evaluation. Bilateral pleural effusions (small right, small-to-moderate left). Additional opacities at the left greater than right lung bases, which may reflect atelectasis and/or consolidation. Electronically Signed   By: Kellie Simmering D.O.   On: 12/12/2021 09:47    Assessment/ Plan: 51 y.o. male   Pneumonia of left lower lobe due to infectious organism - Plan: RSV Ag, Immunochr, Waived, Veritor Flu A/B Waived, Novel Coronavirus, NAA ([facility]), DG Chest 2 View, HYDROcodone bit-homatropine (HYCODAN) 5-1.5 MG/5ML syrup, levofloxacin (LEVAQUIN) 500 MG tablet, DISCONTINUED:  methylPREDNISolone acetate (DEPO-MEDROL) injection 40 mg, DISCONTINUED: doxycycline (VIBRA-TABS) 100 MG tablet  Interstitial lung disease (HCC) - Plan: DISCONTINUED: methylPREDNISolone acetate (DEPO-MEDROL) injection 40 mg, DISCONTINUED: doxycycline (VIBRA-TABS) 100 MG tablet  Personal review of x-ray striated infiltrates in the left lower lobe.  Given recurrent pneumonia and multiple risk factors I am going to treat him with Levaquin that is renally dosed 500 mg every 48 hours after hemodialysis x4 doses.  I will CC PCP and pulmonology as FYI.  Depo-Medrol was not administered given this finding as he was not wheezing on exam.  We discussed use of inhalants and reasons  for reevaluation.  Discussed reasons for emergent evaluation in the ER.  Keep follow-up with PCP.  Consider repeat chest x-ray or imaging to follow-up this infiltrate in 4 weeks  Orders Placed This Encounter  Procedures   RSV Ag, Immunochr, Waived   Novel Coronavirus, NAA ([facility])    Order Specific Question:   Previously tested for COVID-19    Answer:   No    Order Specific Question:   Resident in a congregate (group) care setting    Answer:   No    Order Specific Question:   Is the patient student?    Answer:   No    Order Specific Question:   Employed in healthcare setting    Answer:   No    Order Specific Question:   Has patient completed COVID vaccination(s) (2 doses of Pfizer/Moderna 1 dose of [place])    Answer:   No   Veritor Flu A/B Waived    Order Specific Question:   Source    Answer:   nasal   No orders of the defined types were placed in this encounter.    Janora Norlander, DO [place] [phone]

## 2021-12-13 DIAGNOSIS — N186 End stage renal disease: Secondary | ICD-10-CM | POA: Diagnosis not present

## 2021-12-13 DIAGNOSIS — E43 Unspecified severe protein-calorie malnutrition: Secondary | ICD-10-CM | POA: Diagnosis not present

## 2021-12-15 DIAGNOSIS — E43 Unspecified severe protein-calorie malnutrition: Secondary | ICD-10-CM | POA: Diagnosis not present

## 2021-12-15 DIAGNOSIS — N2581 Secondary hyperparathyroidism of renal origin: Secondary | ICD-10-CM | POA: Diagnosis not present

## 2021-12-15 DIAGNOSIS — E8779 Other fluid overload: Secondary | ICD-10-CM | POA: Diagnosis not present

## 2021-12-15 DIAGNOSIS — D509 Iron deficiency anemia, unspecified: Secondary | ICD-10-CM | POA: Diagnosis not present

## 2021-12-15 DIAGNOSIS — N186 End stage renal disease: Secondary | ICD-10-CM | POA: Diagnosis not present

## 2021-12-15 DIAGNOSIS — D631 Anemia in chronic kidney disease: Secondary | ICD-10-CM | POA: Diagnosis not present

## 2021-12-17 ENCOUNTER — Other Ambulatory Visit: Payer: Self-pay | Admitting: Family Medicine

## 2021-12-17 ENCOUNTER — Telehealth: Payer: Self-pay | Admitting: Family Medicine

## 2021-12-17 DIAGNOSIS — I120 Hypertensive chronic kidney disease with stage 5 chronic kidney disease or end stage renal disease: Secondary | ICD-10-CM | POA: Diagnosis not present

## 2021-12-17 DIAGNOSIS — R918 Other nonspecific abnormal finding of lung field: Secondary | ICD-10-CM | POA: Diagnosis not present

## 2021-12-17 DIAGNOSIS — Z992 Dependence on renal dialysis: Secondary | ICD-10-CM | POA: Diagnosis not present

## 2021-12-17 DIAGNOSIS — E1122 Type 2 diabetes mellitus with diabetic chronic kidney disease: Secondary | ICD-10-CM | POA: Diagnosis not present

## 2021-12-17 DIAGNOSIS — R0981 Nasal congestion: Secondary | ICD-10-CM | POA: Diagnosis not present

## 2021-12-17 DIAGNOSIS — N186 End stage renal disease: Secondary | ICD-10-CM | POA: Diagnosis not present

## 2021-12-17 DIAGNOSIS — R059 Cough, unspecified: Secondary | ICD-10-CM | POA: Diagnosis not present

## 2021-12-17 DIAGNOSIS — R531 Weakness: Secondary | ICD-10-CM | POA: Diagnosis not present

## 2021-12-17 DIAGNOSIS — J984 Other disorders of lung: Secondary | ICD-10-CM | POA: Diagnosis not present

## 2021-12-17 DIAGNOSIS — J029 Acute pharyngitis, unspecified: Secondary | ICD-10-CM | POA: Diagnosis not present

## 2021-12-17 DIAGNOSIS — R0602 Shortness of breath: Secondary | ICD-10-CM | POA: Diagnosis not present

## 2021-12-17 DIAGNOSIS — E039 Hypothyroidism, unspecified: Secondary | ICD-10-CM | POA: Diagnosis not present

## 2021-12-17 DIAGNOSIS — J90 Pleural effusion, not elsewhere classified: Secondary | ICD-10-CM | POA: Diagnosis not present

## 2021-12-17 DIAGNOSIS — J9811 Atelectasis: Secondary | ICD-10-CM | POA: Diagnosis not present

## 2021-12-17 DIAGNOSIS — R911 Solitary pulmonary nodule: Secondary | ICD-10-CM | POA: Diagnosis not present

## 2021-12-17 NOTE — Telephone Encounter (Signed)
Pt mom aware of recommendation

## 2021-12-17 NOTE — Telephone Encounter (Signed)
If he is still feeling poorly, he likely needs admission for IV antibiotics, as he was given an extended treatment with Levaquin already.  Please refer him to the nearest ER.

## 2021-12-17 NOTE — Telephone Encounter (Signed)
  Incoming Patient Call  12/17/2021  What symptoms do you have? Cough some better no energy appetite is poor. Pt finished abx this morning. Mother asking for more abx to help pt feel better.  How long have you been sick? 12/12/2021  Have you been seen for this problem? 12/12/2021  If your provider decides to give you a prescription, which pharmacy would you like for it to be sent to? [facility] in [facility]   Patient informed that this information will be sent to the clinical staff for review and that they should receive a follow up call.

## 2021-12-18 DIAGNOSIS — D509 Iron deficiency anemia, unspecified: Secondary | ICD-10-CM | POA: Diagnosis not present

## 2021-12-18 DIAGNOSIS — D631 Anemia in chronic kidney disease: Secondary | ICD-10-CM | POA: Diagnosis not present

## 2021-12-18 DIAGNOSIS — N2581 Secondary hyperparathyroidism of renal origin: Secondary | ICD-10-CM | POA: Diagnosis not present

## 2021-12-18 DIAGNOSIS — N186 End stage renal disease: Secondary | ICD-10-CM | POA: Diagnosis not present

## 2021-12-18 DIAGNOSIS — E8779 Other fluid overload: Secondary | ICD-10-CM | POA: Diagnosis not present

## 2021-12-18 DIAGNOSIS — E43 Unspecified severe protein-calorie malnutrition: Secondary | ICD-10-CM | POA: Diagnosis not present

## 2021-12-20 ENCOUNTER — Telehealth: Payer: Self-pay | Admitting: Family Medicine

## 2021-12-20 DIAGNOSIS — J9811 Atelectasis: Secondary | ICD-10-CM | POA: Diagnosis not present

## 2021-12-20 DIAGNOSIS — R079 Chest pain, unspecified: Secondary | ICD-10-CM | POA: Diagnosis not present

## 2021-12-20 DIAGNOSIS — K828 Other specified diseases of gallbladder: Secondary | ICD-10-CM | POA: Diagnosis not present

## 2021-12-20 DIAGNOSIS — D509 Iron deficiency anemia, unspecified: Secondary | ICD-10-CM | POA: Diagnosis not present

## 2021-12-20 DIAGNOSIS — G919 Hydrocephalus, unspecified: Secondary | ICD-10-CM | POA: Diagnosis not present

## 2021-12-20 DIAGNOSIS — I613 Nontraumatic intracerebral hemorrhage in brain stem: Secondary | ICD-10-CM | POA: Diagnosis not present

## 2021-12-20 DIAGNOSIS — I161 Hypertensive emergency: Secondary | ICD-10-CM | POA: Diagnosis not present

## 2021-12-20 DIAGNOSIS — K766 Portal hypertension: Secondary | ICD-10-CM | POA: Diagnosis not present

## 2021-12-20 DIAGNOSIS — E8779 Other fluid overload: Secondary | ICD-10-CM | POA: Diagnosis not present

## 2021-12-20 DIAGNOSIS — G936 Cerebral edema: Secondary | ICD-10-CM | POA: Diagnosis not present

## 2021-12-20 DIAGNOSIS — D649 Anemia, unspecified: Secondary | ICD-10-CM | POA: Diagnosis not present

## 2021-12-20 DIAGNOSIS — I6521 Occlusion and stenosis of right carotid artery: Secondary | ICD-10-CM | POA: Diagnosis not present

## 2021-12-20 DIAGNOSIS — Z66 Do not resuscitate: Secondary | ICD-10-CM | POA: Diagnosis not present

## 2021-12-20 DIAGNOSIS — G911 Obstructive hydrocephalus: Secondary | ICD-10-CM | POA: Diagnosis not present

## 2021-12-20 DIAGNOSIS — R400 Somnolence: Secondary | ICD-10-CM | POA: Diagnosis not present

## 2021-12-20 DIAGNOSIS — I615 Nontraumatic intracerebral hemorrhage, intraventricular: Secondary | ICD-10-CM | POA: Diagnosis not present

## 2021-12-20 DIAGNOSIS — E1022 Type 1 diabetes mellitus with diabetic chronic kidney disease: Secondary | ICD-10-CM | POA: Diagnosis not present

## 2021-12-20 DIAGNOSIS — R918 Other nonspecific abnormal finding of lung field: Secondary | ICD-10-CM | POA: Diagnosis not present

## 2021-12-20 DIAGNOSIS — G935 Compression of brain: Secondary | ICD-10-CM | POA: Diagnosis not present

## 2021-12-20 DIAGNOSIS — Z9911 Dependence on respirator [ventilator] status: Secondary | ICD-10-CM | POA: Diagnosis not present

## 2021-12-20 DIAGNOSIS — J690 Pneumonitis due to inhalation of food and vomit: Secondary | ICD-10-CM | POA: Diagnosis not present

## 2021-12-20 DIAGNOSIS — J90 Pleural effusion, not elsewhere classified: Secondary | ICD-10-CM | POA: Diagnosis not present

## 2021-12-20 DIAGNOSIS — Z992 Dependence on renal dialysis: Secondary | ICD-10-CM | POA: Diagnosis not present

## 2021-12-20 DIAGNOSIS — J9690 Respiratory failure, unspecified, unspecified whether with hypoxia or hypercapnia: Secondary | ICD-10-CM | POA: Diagnosis not present

## 2021-12-20 DIAGNOSIS — N261 Atrophy of kidney (terminal): Secondary | ICD-10-CM | POA: Diagnosis not present

## 2021-12-20 DIAGNOSIS — E43 Unspecified severe protein-calorie malnutrition: Secondary | ICD-10-CM | POA: Diagnosis not present

## 2021-12-20 DIAGNOSIS — D6832 Hemorrhagic disorder due to extrinsic circulating anticoagulants: Secondary | ICD-10-CM | POA: Diagnosis not present

## 2021-12-20 DIAGNOSIS — I629 Nontraumatic intracranial hemorrhage, unspecified: Secondary | ICD-10-CM | POA: Diagnosis not present

## 2021-12-20 DIAGNOSIS — E039 Hypothyroidism, unspecified: Secondary | ICD-10-CM | POA: Diagnosis not present

## 2021-12-20 DIAGNOSIS — Z4682 Encounter for fitting and adjustment of non-vascular catheter: Secondary | ICD-10-CM | POA: Diagnosis not present

## 2021-12-20 DIAGNOSIS — N179 Acute kidney failure, unspecified: Secondary | ICD-10-CM | POA: Diagnosis not present

## 2021-12-20 DIAGNOSIS — K3189 Other diseases of stomach and duodenum: Secondary | ICD-10-CM | POA: Diagnosis not present

## 2021-12-20 DIAGNOSIS — J9601 Acute respiratory failure with hypoxia: Secondary | ICD-10-CM | POA: Diagnosis not present

## 2021-12-20 DIAGNOSIS — R188 Other ascites: Secondary | ICD-10-CM | POA: Diagnosis not present

## 2021-12-20 DIAGNOSIS — I160 Hypertensive urgency: Secondary | ICD-10-CM | POA: Diagnosis not present

## 2021-12-20 DIAGNOSIS — Z9181 History of falling: Secondary | ICD-10-CM | POA: Diagnosis not present

## 2021-12-20 DIAGNOSIS — D631 Anemia in chronic kidney disease: Secondary | ICD-10-CM | POA: Diagnosis not present

## 2021-12-20 DIAGNOSIS — D696 Thrombocytopenia, unspecified: Secondary | ICD-10-CM | POA: Diagnosis not present

## 2021-12-20 DIAGNOSIS — Z515 Encounter for palliative care: Secondary | ICD-10-CM | POA: Diagnosis not present

## 2021-12-20 DIAGNOSIS — N2581 Secondary hyperparathyroidism of renal origin: Secondary | ICD-10-CM | POA: Diagnosis not present

## 2021-12-20 DIAGNOSIS — N186 End stage renal disease: Secondary | ICD-10-CM | POA: Diagnosis not present

## 2021-12-20 DIAGNOSIS — R4020 Unspecified coma: Secondary | ICD-10-CM | POA: Diagnosis not present

## 2021-12-20 DIAGNOSIS — I469 Cardiac arrest, cause unspecified: Secondary | ICD-10-CM | POA: Diagnosis not present

## 2021-12-20 DIAGNOSIS — A419 Sepsis, unspecified organism: Secondary | ICD-10-CM | POA: Diagnosis not present

## 2021-12-20 DIAGNOSIS — I120 Hypertensive chronic kidney disease with stage 5 chronic kidney disease or end stage renal disease: Secondary | ICD-10-CM | POA: Diagnosis not present

## 2021-12-21 DIAGNOSIS — J9601 Acute respiratory failure with hypoxia: Secondary | ICD-10-CM | POA: Diagnosis not present

## 2021-12-21 DIAGNOSIS — I613 Nontraumatic intracerebral hemorrhage in brain stem: Secondary | ICD-10-CM | POA: Diagnosis not present

## 2021-12-21 DIAGNOSIS — I160 Hypertensive urgency: Secondary | ICD-10-CM | POA: Diagnosis not present

## 2021-12-21 DIAGNOSIS — D696 Thrombocytopenia, unspecified: Secondary | ICD-10-CM | POA: Diagnosis not present

## 2021-12-21 DIAGNOSIS — G911 Obstructive hydrocephalus: Secondary | ICD-10-CM | POA: Diagnosis not present

## 2021-12-21 DIAGNOSIS — N186 End stage renal disease: Secondary | ICD-10-CM | POA: Diagnosis not present

## 2021-12-21 DIAGNOSIS — I469 Cardiac arrest, cause unspecified: Secondary | ICD-10-CM | POA: Diagnosis not present

## 2021-12-21 DIAGNOSIS — J690 Pneumonitis due to inhalation of food and vomit: Secondary | ICD-10-CM | POA: Diagnosis not present

## 2021-12-21 DIAGNOSIS — I615 Nontraumatic intracerebral hemorrhage, intraventricular: Secondary | ICD-10-CM | POA: Diagnosis not present

## 2021-12-21 DIAGNOSIS — D649 Anemia, unspecified: Secondary | ICD-10-CM | POA: Diagnosis not present

## 2021-12-22 DIAGNOSIS — N186 End stage renal disease: Secondary | ICD-10-CM | POA: Diagnosis not present

## 2021-12-22 DIAGNOSIS — E43 Unspecified severe protein-calorie malnutrition: Secondary | ICD-10-CM | POA: Diagnosis not present

## 2021-12-24 ENCOUNTER — Ambulatory Visit: Payer: Medicare Other | Admitting: Family Medicine

## 2021-12-25 DIAGNOSIS — E43 Unspecified severe protein-calorie malnutrition: Secondary | ICD-10-CM | POA: Diagnosis not present

## 2021-12-25 DIAGNOSIS — N186 End stage renal disease: Secondary | ICD-10-CM | POA: Diagnosis not present

## 2022-01-04 NOTE — Telephone Encounter (Signed)
Pt has passed away. Dr. Warrick Parisian is aware.

## 2022-01-04 NOTE — Telephone Encounter (Signed)
Just have him check the thyroid level at dialysis and send it to me

## 2022-01-04 DEATH — deceased

## 2022-05-08 ENCOUNTER — Encounter (INDEPENDENT_AMBULATORY_CARE_PROVIDER_SITE_OTHER): Payer: Medicare Other | Admitting: Ophthalmology
